# Patient Record
Sex: Female | Born: 1945
Health system: Southern US, Community
[De-identification: ages and names within clinical notes are randomized; demographics above are authoritative.]

## PROBLEM LIST (undated history)

## (undated) DIAGNOSIS — F17200 Nicotine dependence, unspecified, uncomplicated: Secondary | ICD-10-CM

## (undated) DIAGNOSIS — Q632 Ectopic kidney: Secondary | ICD-10-CM

## (undated) DIAGNOSIS — Z9989 Dependence on other enabling machines and devices: Secondary | ICD-10-CM

## (undated) DIAGNOSIS — H9319 Tinnitus, unspecified ear: Secondary | ICD-10-CM

## (undated) DIAGNOSIS — E079 Disorder of thyroid, unspecified: Secondary | ICD-10-CM

## (undated) DIAGNOSIS — R51 Headache: Secondary | ICD-10-CM

## (undated) DIAGNOSIS — Z9289 Personal history of other medical treatment: Secondary | ICD-10-CM

## (undated) DIAGNOSIS — T7840XA Allergy, unspecified, initial encounter: Secondary | ICD-10-CM

## (undated) DIAGNOSIS — I499 Cardiac arrhythmia, unspecified: Secondary | ICD-10-CM

## (undated) DIAGNOSIS — M25569 Pain in unspecified knee: Secondary | ICD-10-CM

## (undated) DIAGNOSIS — R7303 Prediabetes: Secondary | ICD-10-CM

## (undated) DIAGNOSIS — G4730 Sleep apnea, unspecified: Secondary | ICD-10-CM

## (undated) DIAGNOSIS — E039 Hypothyroidism, unspecified: Secondary | ICD-10-CM

## (undated) DIAGNOSIS — K56609 Unspecified intestinal obstruction, unspecified as to partial versus complete obstruction: Secondary | ICD-10-CM

## (undated) DIAGNOSIS — E119 Type 2 diabetes mellitus without complications: Secondary | ICD-10-CM

## (undated) DIAGNOSIS — M109 Gout, unspecified: Secondary | ICD-10-CM

## (undated) DIAGNOSIS — I509 Heart failure, unspecified: Secondary | ICD-10-CM

## (undated) DIAGNOSIS — D0510 Intraductal carcinoma in situ of unspecified breast: Secondary | ICD-10-CM

## (undated) DIAGNOSIS — M1990 Unspecified osteoarthritis, unspecified site: Secondary | ICD-10-CM

## (undated) DIAGNOSIS — I10 Essential (primary) hypertension: Secondary | ICD-10-CM

## (undated) DIAGNOSIS — E785 Hyperlipidemia, unspecified: Secondary | ICD-10-CM

## (undated) DIAGNOSIS — M25519 Pain in unspecified shoulder: Secondary | ICD-10-CM

## (undated) HISTORY — DX: Ectopic kidney: Q63.2

## (undated) HISTORY — DX: Heart failure, unspecified: I50.9

## (undated) HISTORY — DX: Allergy, unspecified, initial encounter: T78.40XA

## (undated) HISTORY — PX: AUGMENTATION MAMMAPLASTY: SUR837

## (undated) HISTORY — DX: Dependence on other enabling machines and devices: Z99.89

## (undated) HISTORY — DX: Essential (primary) hypertension: I10

## (undated) HISTORY — PX: CHOLECYSTECTOMY: SHX55

## (undated) HISTORY — PX: OTHER SURGICAL HISTORY: SHX169

## (undated) HISTORY — DX: Prediabetes: R73.03

## (undated) HISTORY — PX: ECTOPIC PREGNANCY SURGERY: SHX613

## (undated) HISTORY — PX: APPENDECTOMY: SHX54

## (undated) HISTORY — PX: UPPER GASTROINTESTINAL ENDOSCOPY: SHX188

## (undated) HISTORY — PX: JOINT REPLACEMENT: SHX530

## (undated) HISTORY — DX: Hyperlipidemia, unspecified: E78.5

## (undated) HISTORY — DX: Nicotine dependence, unspecified, uncomplicated: F17.200

## (undated) HISTORY — PX: POLYPECTOMY: SHX149

## (undated) HISTORY — DX: Personal history of other medical treatment: Z92.89

## (undated) HISTORY — DX: Disorder of thyroid, unspecified: E07.9

## (undated) HISTORY — PX: BACK SURGERY: SHX140

---
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
## 1984-06-24 DIAGNOSIS — Z9289 Personal history of other medical treatment: Secondary | ICD-10-CM

## 1984-06-24 HISTORY — PX: ABDOMINAL HYSTERECTOMY: SHX81

## 1984-06-24 HISTORY — DX: Personal history of other medical treatment: Z92.89

## 1997-12-19 ENCOUNTER — Ambulatory Visit (HOSPITAL_COMMUNITY): Admission: RE | Admit: 1997-12-19 | Discharge: 1997-12-19 | Payer: Self-pay | Admitting: Family Medicine

## 1998-12-28 ENCOUNTER — Encounter: Payer: Self-pay | Admitting: Family Medicine

## 1998-12-28 ENCOUNTER — Ambulatory Visit (HOSPITAL_COMMUNITY): Admission: RE | Admit: 1998-12-28 | Discharge: 1998-12-28 | Payer: Self-pay | Admitting: Family Medicine

## 1999-12-31 ENCOUNTER — Encounter: Payer: Self-pay | Admitting: Family Medicine

## 1999-12-31 ENCOUNTER — Ambulatory Visit (HOSPITAL_COMMUNITY): Admission: RE | Admit: 1999-12-31 | Discharge: 1999-12-31 | Payer: Self-pay | Admitting: Family Medicine

## 2001-01-02 ENCOUNTER — Encounter: Payer: Self-pay | Admitting: Family Medicine

## 2001-01-02 ENCOUNTER — Ambulatory Visit (HOSPITAL_COMMUNITY): Admission: RE | Admit: 2001-01-02 | Discharge: 2001-01-02 | Payer: Self-pay | Admitting: Family Medicine

## 2001-01-26 ENCOUNTER — Ambulatory Visit (HOSPITAL_COMMUNITY): Admission: RE | Admit: 2001-01-26 | Discharge: 2001-01-26 | Payer: Self-pay | Admitting: Family Medicine

## 2001-01-26 ENCOUNTER — Encounter: Payer: Self-pay | Admitting: Family Medicine

## 2002-01-27 ENCOUNTER — Ambulatory Visit (HOSPITAL_COMMUNITY): Admission: RE | Admit: 2002-01-27 | Discharge: 2002-01-27 | Payer: Self-pay | Admitting: Family Medicine

## 2002-01-27 ENCOUNTER — Encounter: Payer: Self-pay | Admitting: Family Medicine

## 2003-02-03 ENCOUNTER — Ambulatory Visit (HOSPITAL_COMMUNITY): Admission: RE | Admit: 2003-02-03 | Discharge: 2003-02-03 | Payer: Self-pay | Admitting: Family Medicine

## 2003-02-03 ENCOUNTER — Encounter: Payer: Self-pay | Admitting: Family Medicine

## 2003-02-11 ENCOUNTER — Ambulatory Visit (HOSPITAL_BASED_OUTPATIENT_CLINIC_OR_DEPARTMENT_OTHER): Admission: RE | Admit: 2003-02-11 | Discharge: 2003-02-11 | Payer: Self-pay | Admitting: Family Medicine

## 2003-07-07 ENCOUNTER — Encounter: Admission: RE | Admit: 2003-07-07 | Discharge: 2003-07-07 | Payer: Self-pay | Admitting: Orthopedic Surgery

## 2003-07-22 ENCOUNTER — Encounter: Admission: RE | Admit: 2003-07-22 | Discharge: 2003-07-22 | Payer: Self-pay | Admitting: Orthopedic Surgery

## 2003-07-26 ENCOUNTER — Encounter: Admission: RE | Admit: 2003-07-26 | Discharge: 2003-07-26 | Payer: Self-pay | Admitting: Family Medicine

## 2004-02-07 ENCOUNTER — Ambulatory Visit (HOSPITAL_COMMUNITY): Admission: RE | Admit: 2004-02-07 | Discharge: 2004-02-07 | Payer: Self-pay | Admitting: Family Medicine

## 2004-07-11 ENCOUNTER — Encounter: Admission: RE | Admit: 2004-07-11 | Discharge: 2004-07-11 | Payer: Self-pay | Admitting: Family Medicine

## 2004-11-14 ENCOUNTER — Emergency Department (HOSPITAL_COMMUNITY): Admission: EM | Admit: 2004-11-14 | Discharge: 2004-11-14 | Payer: Self-pay | Admitting: Emergency Medicine

## 2005-02-07 ENCOUNTER — Ambulatory Visit (HOSPITAL_COMMUNITY): Admission: RE | Admit: 2005-02-07 | Discharge: 2005-02-07 | Payer: Self-pay | Admitting: Family Medicine

## 2006-02-12 ENCOUNTER — Ambulatory Visit (HOSPITAL_COMMUNITY): Admission: RE | Admit: 2006-02-12 | Discharge: 2006-02-12 | Payer: Self-pay | Admitting: Family Medicine

## 2007-02-19 ENCOUNTER — Ambulatory Visit (HOSPITAL_COMMUNITY): Admission: RE | Admit: 2007-02-19 | Discharge: 2007-02-19 | Payer: Self-pay | Admitting: Family Medicine

## 2008-02-03 ENCOUNTER — Ambulatory Visit: Payer: Self-pay | Admitting: Gastroenterology

## 2008-02-15 ENCOUNTER — Ambulatory Visit: Payer: Self-pay | Admitting: Gastroenterology

## 2008-02-15 HISTORY — PX: COLONOSCOPY: SHX174

## 2008-02-15 LAB — HM COLONOSCOPY: HM Colonoscopy: NORMAL

## 2008-02-24 ENCOUNTER — Ambulatory Visit (HOSPITAL_COMMUNITY): Admission: RE | Admit: 2008-02-24 | Discharge: 2008-02-24 | Payer: Self-pay | Admitting: Family Medicine

## 2008-04-11 ENCOUNTER — Emergency Department (HOSPITAL_COMMUNITY): Admission: EM | Admit: 2008-04-11 | Discharge: 2008-04-11 | Payer: Self-pay | Admitting: Emergency Medicine

## 2009-03-16 ENCOUNTER — Ambulatory Visit (HOSPITAL_COMMUNITY): Admission: RE | Admit: 2009-03-16 | Discharge: 2009-03-16 | Payer: Self-pay | Admitting: Family Medicine

## 2009-12-28 ENCOUNTER — Encounter: Admission: RE | Admit: 2009-12-28 | Discharge: 2009-12-28 | Payer: Self-pay | Admitting: Family Medicine

## 2010-03-22 ENCOUNTER — Ambulatory Visit (HOSPITAL_COMMUNITY): Admission: RE | Admit: 2010-03-22 | Discharge: 2010-03-22 | Payer: Self-pay | Admitting: Family Medicine

## 2010-07-14 ENCOUNTER — Encounter: Payer: Self-pay | Admitting: Orthopedic Surgery

## 2010-07-14 ENCOUNTER — Encounter: Payer: Self-pay | Admitting: Interventional Radiology

## 2010-07-16 ENCOUNTER — Encounter: Payer: Self-pay | Admitting: Family Medicine

## 2010-10-04 ENCOUNTER — Encounter: Payer: Self-pay | Admitting: *Deleted

## 2010-10-04 ENCOUNTER — Encounter: Payer: Self-pay | Admitting: Cardiovascular Disease

## 2010-10-04 ENCOUNTER — Ambulatory Visit (INDEPENDENT_AMBULATORY_CARE_PROVIDER_SITE_OTHER): Payer: 59 | Admitting: Cardiovascular Disease

## 2010-10-04 VITALS — BP 140/90 | HR 56 | Wt 191.0 lb

## 2010-10-04 DIAGNOSIS — I5022 Chronic systolic (congestive) heart failure: Secondary | ICD-10-CM | POA: Insufficient documentation

## 2010-10-04 DIAGNOSIS — E039 Hypothyroidism, unspecified: Secondary | ICD-10-CM

## 2010-10-04 DIAGNOSIS — R079 Chest pain, unspecified: Secondary | ICD-10-CM

## 2010-10-04 DIAGNOSIS — E785 Hyperlipidemia, unspecified: Secondary | ICD-10-CM

## 2010-10-04 DIAGNOSIS — I10 Essential (primary) hypertension: Secondary | ICD-10-CM

## 2010-10-04 DIAGNOSIS — I509 Heart failure, unspecified: Secondary | ICD-10-CM

## 2010-10-04 LAB — HEPATIC FUNCTION PANEL
AST: 27 U/L (ref 0–37)
Albumin: 3.6 g/dL (ref 3.5–5.2)
Alkaline Phosphatase: 75 U/L (ref 39–117)
Bilirubin, Direct: 0.1 mg/dL (ref 0.0–0.3)

## 2010-10-04 LAB — BASIC METABOLIC PANEL
CO2: 27 mEq/L (ref 19–32)
GFR: 110.45 mL/min (ref >60.00)
Glucose, Bld: 91 mg/dL (ref 70–99)
Potassium: 3.9 mEq/L (ref 3.5–5.1)
Sodium: 140 mEq/L (ref 135–145)

## 2010-10-04 MED ORDER — LISINOPRIL 20 MG PO TABS: 20.0000 mg | ORAL_TABLET | Freq: Every day | ORAL | Status: DC

## 2010-10-04 MED ORDER — AMLODIPINE BESYLATE 5 MG PO TABS: 5.0000 mg | ORAL_TABLET | Freq: Every day | ORAL | Status: DC

## 2010-10-04 NOTE — Assessment & Plan Note (Addendum)
Adrienne [name] is currently working for another medical Dr. She is to see Dr. Noel Journey but she is no longer seeing patients in [place]. We'll check a TSH today. We'll check a basic metabolic profile.  October 05, 2010.  The TSH level returned and was out of be quite low. We will decrease her Synthroid to 0.075 mg a day. She'll need to find a general medical Dr. For further follow. We'll recheck her TSH in and 6 weeks.

## 2010-10-04 NOTE — Progress Notes (Signed)
Adrienne [name] Date of Birth  04-10-1946 [facility] [address]     [address] [place], [place]  [address] [phone]  Fax  [phone]   History of Present Illness:  Adrienne [name] is a middle-age female with a history of hypertension, dyslipidemia, palpitations. She also has a history of chest pain. A nuclear stress test from March of 2010 was negative for ischemia.   She has not had episodes of chest pain or shortness of breath.  Current Outpatient Prescriptions  Medication Sig Dispense Refill  . amLODipine (NORVASC) 5 MG tablet Take 5 mg by mouth daily.        Marland Kitchen aspirin 81 MG tablet Take 81 mg by mouth as needed.        . carvedilol (COREG) 3.125 MG tablet Take 3.125 mg by mouth daily.        Marland Kitchen levothyroxine (SYNTHROID, LEVOTHROID) 100 MCG tablet Take 100 mcg by mouth daily.        Marland Kitchen lisinopril (PRINIVIL,ZESTRIL) 10 MG tablet Take 10 mg by mouth daily.          No Known Allergies  Past Medical History  Diagnosis Date  . Hypertension   . Thyroid disease   . Hyperlipidemia   . CHF (congestive heart failure)     EF 45-50%    Past Surgical History  Procedure Date  . Vaginal hysterectomy     1986  . Back surgery   . Cholecystectomy     History  Smoking status  . Former Smoker  . Quit date: 05/24/2010  Smokeless tobacco  . Not on file    History  Alcohol Use No    No family history on file.  Reviw of Systems:  Reviewed in the history of present illness. All other systems are negative.  Physical Exam: BP 140/90  Pulse 56  Wt 191 lb (86.637 kg) The patient is alert and oriented x 3.  The mood and affect are normal.  The skin is warm and dry.  Color is normal.  The HEENT exam reveals that the sclera are nonicteric.  The mucous membranes are moist.  The carotids are 2+ without bruits.  There is no thyromegaly.  There is no JVD.  The lungs are clear.  The chest wall is non tender.  The heart exam reveals a regular rate  with a normal S1 and S2.  There are no murmurs, gallops, or rubs.  The PMI is not displaced.   Abdominal exam reveals good bowel sounds.  There is no guarding or rebound.  There is no hepatosplenomegaly or tenderness.  There are no masses.  Exam of the legs reveal no clubbing, cyanosis, or edema.  The legs are without rashes.  The distal pulses are intact.  Cranial nerves II - XII are intact.  Motor and sensory functions are intact.  The gait is normal.  ECG: Sinus bradycardia with frequent premature ventricular contractions Assessment / Plan:

## 2010-10-04 NOTE — Assessment & Plan Note (Signed)
Her blood pressure remains mildly elevated. We'll increase her lisinopril to 20 mg a day. I've asked her to watch her salt intake. We will see her again in several months.

## 2010-10-05 ENCOUNTER — Other Ambulatory Visit: Payer: Self-pay | Admitting: *Deleted

## 2010-10-05 ENCOUNTER — Encounter: Payer: Self-pay | Admitting: Cardiovascular Disease

## 2010-10-05 DIAGNOSIS — E039 Hypothyroidism, unspecified: Secondary | ICD-10-CM

## 2010-10-05 MED ORDER — LEVOTHYROXINE SODIUM 75 MCG PO TABS: 75.0000 ug | ORAL_TABLET | Freq: Every day | ORAL | Status: DC

## 2010-10-05 NOTE — Telephone Encounter (Signed)
Patient called with lab results. synthroid adjusted. Faxed new order to pharmacy. Pt verbalized understanding. Alfonso Ramus RN

## 2010-11-08 ENCOUNTER — Other Ambulatory Visit: Payer: Self-pay | Admitting: *Deleted

## 2010-11-08 DIAGNOSIS — I10 Essential (primary) hypertension: Secondary | ICD-10-CM

## 2010-11-08 MED ORDER — AMLODIPINE BESYLATE 5 MG PO TABS: 5.0000 mg | ORAL_TABLET | Freq: Every day | ORAL | Status: DC

## 2010-11-08 NOTE — Telephone Encounter (Signed)
Jodette Tamico Mundo RN  

## 2010-11-16 ENCOUNTER — Other Ambulatory Visit (INDEPENDENT_AMBULATORY_CARE_PROVIDER_SITE_OTHER): Payer: 59 | Admitting: *Deleted

## 2010-11-16 DIAGNOSIS — E039 Hypothyroidism, unspecified: Secondary | ICD-10-CM

## 2010-11-21 ENCOUNTER — Telehealth: Payer: Self-pay | Admitting: *Deleted

## 2010-11-21 NOTE — Telephone Encounter (Signed)
Message copied by Antony Odea on Wed Nov 21, 2010 10:45 AM ------      Message from: Vesta Mixer      Created: Tue Nov 20, 2010 11:38 AM       tsh is better.

## 2010-11-21 NOTE — Telephone Encounter (Signed)
Patient called with lab results. Pt verbalized understanding. Pt feeling better now that she is back on med.Alfonso Ramus RN

## 2010-12-07 ENCOUNTER — Other Ambulatory Visit (HOSPITAL_COMMUNITY): Payer: Self-pay | Admitting: *Deleted

## 2010-12-07 ENCOUNTER — Other Ambulatory Visit: Payer: Self-pay | Admitting: Family Medicine

## 2010-12-07 DIAGNOSIS — Z1231 Encounter for screening mammogram for malignant neoplasm of breast: Secondary | ICD-10-CM

## 2010-12-20 ENCOUNTER — Ambulatory Visit (HOSPITAL_COMMUNITY)
Admission: RE | Admit: 2010-12-20 | Discharge: 2010-12-20 | Disposition: A | Discharge status: Home / Self Care | Payer: 59 | Source: Ambulatory Visit | Attending: Family Medicine | Admitting: Family Medicine

## 2010-12-20 DIAGNOSIS — Z1231 Encounter for screening mammogram for malignant neoplasm of breast: Secondary | ICD-10-CM | POA: Insufficient documentation

## 2010-12-21 ENCOUNTER — Other Ambulatory Visit: Payer: Self-pay | Admitting: *Deleted

## 2010-12-21 DIAGNOSIS — I10 Essential (primary) hypertension: Secondary | ICD-10-CM

## 2010-12-21 DIAGNOSIS — I509 Heart failure, unspecified: Secondary | ICD-10-CM

## 2010-12-21 MED ORDER — LISINOPRIL 20 MG PO TABS: 20.0000 mg | ORAL_TABLET | Freq: Every day | ORAL | Status: DC

## 2010-12-21 NOTE — Telephone Encounter (Signed)
escribe medication per fax request  

## 2011-03-26 LAB — CBC
Hemoglobin: 14.4
MCHC: 32.4
MCV: 92.6
RDW: 13.7

## 2011-03-26 LAB — POCT I-STAT, CHEM 8
Creatinine, Ser: 0.9
HCT: 44
Hemoglobin: 15
Sodium: 140
TCO2: 23

## 2011-03-26 LAB — DIFFERENTIAL
Basophils Absolute: 0.1
Basophils Relative: 1
Eosinophils Absolute: 0.2
Eosinophils Relative: 2

## 2011-11-26 ENCOUNTER — Other Ambulatory Visit: Payer: Self-pay | Admitting: Cardiovascular Disease

## 2011-11-26 DIAGNOSIS — I10 Essential (primary) hypertension: Secondary | ICD-10-CM

## 2011-11-26 NOTE — Telephone Encounter (Signed)
Amlodipine 5mg

## 2011-11-26 NOTE — Telephone Encounter (Signed)
Opened in Error.

## 2012-02-25 ENCOUNTER — Other Ambulatory Visit: Payer: Self-pay | Admitting: *Deleted

## 2012-02-25 MED ORDER — LISINOPRIL 20 MG PO TABS: 20.0000 mg | ORAL_TABLET | Freq: Every day | ORAL | Status: DC

## 2012-02-25 MED ORDER — CARVEDILOL 3.125 MG PO TABS: 3.1250 mg | ORAL_TABLET | Freq: Every day | ORAL | Status: DC

## 2012-02-25 MED ORDER — LEVOTHYROXINE SODIUM 75 MCG PO TABS: 75.0000 ug | ORAL_TABLET | Freq: Every day | ORAL | Status: DC

## 2012-02-25 MED ORDER — AMLODIPINE BESYLATE 5 MG PO TABS: 5.0000 mg | ORAL_TABLET | Freq: Every day | ORAL | Status: DC

## 2012-02-25 NOTE — Telephone Encounter (Signed)
Pt needs appointment then refill can be made Fax Received. Refill Completed. Kutler Vanvranken Chowoe (R.M.A)   

## 2012-02-27 ENCOUNTER — Encounter: Payer: Self-pay | Admitting: *Deleted

## 2012-09-23 ENCOUNTER — Other Ambulatory Visit: Payer: Self-pay | Admitting: Family Medicine

## 2012-09-23 DIAGNOSIS — Z1231 Encounter for screening mammogram for malignant neoplasm of breast: Secondary | ICD-10-CM

## 2012-09-24 ENCOUNTER — Ambulatory Visit (HOSPITAL_COMMUNITY)
Admission: RE | Admit: 2012-09-24 | Discharge: 2012-09-24 | Disposition: A | Discharge status: Home / Self Care | Payer: Medicare Other | Source: Ambulatory Visit | Attending: Family Medicine | Admitting: Family Medicine

## 2012-09-24 DIAGNOSIS — Z1231 Encounter for screening mammogram for malignant neoplasm of breast: Secondary | ICD-10-CM | POA: Insufficient documentation

## 2012-10-02 ENCOUNTER — Other Ambulatory Visit: Payer: Medicare Other

## 2012-11-17 ENCOUNTER — Encounter: Payer: Self-pay | Admitting: *Deleted

## 2012-11-18 ENCOUNTER — Ambulatory Visit (INDEPENDENT_AMBULATORY_CARE_PROVIDER_SITE_OTHER): Payer: Medicare Other | Admitting: Obstetrics and Gynecology

## 2012-11-18 ENCOUNTER — Encounter: Payer: Self-pay | Admitting: Obstetrics and Gynecology

## 2012-11-18 VITALS — BP 120/70 | Ht 64.0 in | Wt 177.0 lb

## 2012-11-18 DIAGNOSIS — Z1212 Encounter for screening for malignant neoplasm of rectum: Secondary | ICD-10-CM

## 2012-11-18 DIAGNOSIS — Z01419 Encounter for gynecological examination (general) (routine) without abnormal findings: Secondary | ICD-10-CM

## 2012-11-18 LAB — HEMOCCULT GUIAC POC 1CARD (OFFICE): Fecal Occult Blood, POC: NEGATIVE

## 2012-11-18 NOTE — Patient Instructions (Signed)
Wellness exam frequencies reviewed

## 2012-11-18 NOTE — Addendum Note (Signed)
Addended by: Richardson Chiquito on: 11/18/2012 10:58 AM   Modules accepted: Orders

## 2012-11-18 NOTE — Progress Notes (Signed)
Patient ID: Adrienne [name], female   DOB: 1945-12-14, 67 y.o.   MRN: [id number] Pt here today for yearly Pap and Physical. Pt has had a hysterectomy. Pt denies any problems at this time.  Assessment:  Normal Gyn Exam s/p hyst   Plan:  1. Mammogram yearly 2. pap smear done, 3. return annually  Subjective:  Adrienne [name] is a 67 y.o. female [phone] who presents for annual exam.  The patient has complaints today of none  The following portions of the patient's history were reviewed and updated as appropriate: allergies, current medications, past family history, past medical history, past social history, past surgical history and problem list.  Review of Systems Pertinent items are noted in HPI.  Objective:  BP 120/70  Ht 5\' 4"  (1.626 m)  Wt 177 lb (80.287 kg)  BMI 30.37 kg/m2  BMI: Body mass index is 30.37 kg/(m^2). General Appearance: Alert, appropriate appearance for age. No acute distress HEENT: Grossly normal Neck / Thyroid:  Cardiovascular: RRR; normal S1, S2, no murmur Lungs: CTA bilaterally Back: No CVAT Breast Exam: No dimpling, nipple retraction or discharge. No masses or nodes., Normal to inspection, Normal breast tissue bilaterally and No masses or nodes.No dimpling, nipple retraction or discharge. Gastrointestinal: Soft, non-tender, no masses or organomegaly Pelvic Exam: External genitalia: normal general appearance Vaginal: normal mucosa without prolapse or lesions Cervix: absent and removed surgically Adnexa: normal bimanual exam Rectal: good sphincter tone and guaiac negative Rectovaginal: normal rectal, no masses Lymphatic Exam: Non-palpable nodes in neck, clavicular, axillary, or inguinal regions Skin: no rash or abnormalities Neurologic: Normal gait and speech, no tremor  Psychiatric: Alert and oriented, appropriate affect.  Urinalysis:Not done  Christin Bach. MD Pgr [phone] 10:51 AM

## 2012-11-25 ENCOUNTER — Ambulatory Visit (INDEPENDENT_AMBULATORY_CARE_PROVIDER_SITE_OTHER): Payer: Medicare Other | Admitting: Physician Assistant

## 2012-11-25 ENCOUNTER — Encounter: Payer: Self-pay | Admitting: Physician Assistant

## 2012-11-25 VITALS — BP 144/86 | HR 56 | Temp 98.6°F | Resp 16 | Ht 63.25 in | Wt 178.0 lb

## 2012-11-25 DIAGNOSIS — I5022 Chronic systolic (congestive) heart failure: Secondary | ICD-10-CM

## 2012-11-25 DIAGNOSIS — E785 Hyperlipidemia, unspecified: Secondary | ICD-10-CM

## 2012-11-25 DIAGNOSIS — E039 Hypothyroidism, unspecified: Secondary | ICD-10-CM

## 2012-11-25 DIAGNOSIS — I509 Heart failure, unspecified: Secondary | ICD-10-CM

## 2012-11-25 DIAGNOSIS — I10 Essential (primary) hypertension: Secondary | ICD-10-CM

## 2012-11-25 DIAGNOSIS — F17200 Nicotine dependence, unspecified, uncomplicated: Secondary | ICD-10-CM | POA: Insufficient documentation

## 2012-11-25 LAB — COMPLETE METABOLIC PANEL WITH GFR
ALT: 14 U/L (ref 0–35)
CO2: 26 mEq/L (ref 19–32)
Calcium: 9.6 mg/dL (ref 8.4–10.5)
Chloride: 106 mEq/L (ref 96–112)
Creat: 0.93 mg/dL (ref 0.50–1.10)
GFR, Est African American: 75 mL/min

## 2012-11-25 LAB — LIPID PANEL
LDL Cholesterol: 208 mg/dL — ABNORMAL HIGH (ref 0–99)
Triglycerides: 145 mg/dL (ref <150)

## 2012-11-25 LAB — TSH: TSH: 1.579 u[IU]/mL (ref 0.350–4.500)

## 2012-11-25 MED ORDER — AMLODIPINE BESYLATE 5 MG PO TABS: 5.0000 mg | ORAL_TABLET | Freq: Every day | ORAL | Status: DC

## 2012-11-25 MED ORDER — LISINOPRIL 20 MG PO TABS: 20.0000 mg | ORAL_TABLET | Freq: Every day | ORAL | Status: DC

## 2012-11-25 MED ORDER — CARVEDILOL 3.125 MG PO TABS: 3.1250 mg | ORAL_TABLET | Freq: Two times a day (BID) | ORAL | Status: DC

## 2012-11-25 MED ORDER — LEVOTHYROXINE SODIUM 75 MCG PO TABS: 75.0000 ug | ORAL_TABLET | Freq: Every day | ORAL | Status: DC

## 2012-11-25 NOTE — Progress Notes (Signed)
Patient ID: Adrienne [name] MRN: [id number], DOB: 1945/08/15, 67 y.o. Date of Encounter: @DATE @  Chief Complaint:  Chief Complaint  Patient presents with  . Hypertension    HPI: 67 y.o. year old AA  female  presents for routine f/u of:  1-HTN: She IS taking all BP meds as directed. Has no adv effects.  2-Hypothyroid: IS taking thyroid med as directed. No significant change in energy level or weight.  3- Smoker: Says she knows she can quit on her own if she decide to b/c she sometimes goes weeks without smoking. However, she says "this isnt a good time for that--too much stress at home"   She has no complaints today. Says she has been feeling fine. Has gone to no other doctors since LOV here except her Gyn.  Reports no chest pressure, heaviness, tightness and no SOB/DOE.   Past Medical History  Diagnosis Date  . Hypertension   . Thyroid disease   . Hyperlipidemia   . CHF (congestive heart failure)     EF 45-50%  . Smoker      Home Meds: See attached medication section for current medication list. Any medications entered into computer today will not appear on this note's list. The medications listed below were entered prior to today. Current Outpatient Prescriptions on File Prior to Visit  Medication Sig Dispense Refill  . aspirin 81 MG tablet Take 81 mg by mouth as needed.         No current facility-administered medications on file prior to visit.    Allergies: No Known Allergies  History   Social History  . Marital Status: Widowed    Spouse Name: N/A    Number of Children: N/A  . Years of Education: N/A   Occupational History  . Not on file.   Social History Main Topics  . Smoking status: Current Every Day Smoker -- 1.00 packs/day for 1 years    Types: Cigarettes    Last Attempt to Quit: 05/24/2010  . Smokeless tobacco: Never Used     Comment: Started back smoking last year.  . Alcohol Use: No  . Drug Use: No  . Sexually Active: Not Currently    Birth  Control/ Protection: Surgical   Other Topics Concern  . Not on file   Social History Narrative  . No narrative on file    Family History  Problem Relation Age of Onset  . Cancer Sister   . Diabetes Brother   . Hypertension Other   . Diabetes Brother      Review of Systems:  See HPI for pertinent ROS. All other ROS negative.    Physical Exam: Blood pressure 144/86, pulse 56, temperature 98.6 F (37 C), temperature source Oral, resp. rate 16, height 5' 3.25" (1.607 m), weight 178 lb (80.74 kg)., Body mass index is 31.26 kg/(m^2). General:Mild Obese AAF. Appears in no acute distress. Neck: Supple. No thyromegaly. No lymphadenopathy. No carotid bruit.  Lungs: Clear bilaterally to auscultation without wheezes, rales, or rhonchi. Breathing is unlabored. Heart: RRR with S1 S2. No murmurs, rubs, or gallops. Abdomen: Soft, non-tender, non-distended with normoactive bowel sounds. No hepatomegaly. No rebound/guarding. No obvious abdominal masses. Musculoskeletal:  Strength and tone normal for age. Extremities/Skin: Warm and dry. No clubbing or cyanosis. No edema. No rashes or suspicious lesions. Neuro: Alert and oriented X 3. Moves all extremities spontaneously. Gait is normal. CNII-XII grossly in tact. Psych:  Responds to questions appropriately with a normal affect.  ASSESSMENT AND PLAN:  67 y.o. year old female with  1. Hypertension Pt says she had argument with grandson this morning right before appt. Says she know that is why BP is up. Also says she forgot to take med last night. She says she checks BP at home and never higher than 120s.  Will cont current meds. Check BMET to monitor.  - COMPLETE METABOLIC PANEL WITH GFR - amLODipine (NORVASC) 5 MG tablet; Take 1 tablet (5 mg total) by mouth daily.  Dispense: 90 tablet; Refill: 1 - carvedilol (COREG) 3.125 MG tablet; Take 1 tablet (3.125 mg total) by mouth 2 (two) times daily with a meal.  Dispense: 180 tablet; Refill: 1 -  lisinopril (PRINIVIL,ZESTRIL) 20 MG tablet; Take 1 tablet (20 mg total) by mouth daily.  Dispense: 90 tablet; Refill: 1  2. Hypothyroidism Currnetly on 75 mcg. Recheck TSH then adjust accordingly. Pt aware not to pick up refill until we get lab results back.  - TSH - levothyroxine (SYNTHROID, LEVOTHROID) 75 MCG tablet; Take 1 tablet (75 mcg total) by mouth daily.  Dispense: 90 tablet; Refill: 1  3. Dyslipidemia In past she had EXTREMELY high cholesterol--04/25/12 LDL was 201. I asked her today why she repeatedly has been noncompliant with this (Had been VERY high in past and she did not f/u with med then either). She says "I dont want to take another pill..If I start a pill, will I have to take it the rest of my life?"  I answered her question, discussed risk of MI, CVA, Death vs taking a pill. She is agreeable to take med if necessary. Will recheck lab then address accordingly. - COMPLETE METABOLIC PANEL WITH GFR - Lipid panel  4. Congestive heart failure, chronic, systolic We did not have any cardiac hx in her paper chart but this DX was in Epic. I then read notes in Epic and discussed with pt.  She saw Dr. Elease Hashimoto in past sec to palpitations. Also had chest pain and had Negative Nuclear Stress Test 08/2008.  She knows nothing about Depressed EF and I donot see any actual test reports in Epic but do see this in Dr Namon Cirri OV note.  She has no CHF symptoms or findings on exam. Is on Coreg and ACE Inh.  I told her to sched f/u with Dr. Elease Hashimoto.  LOV note I see in Epic was 10/04/2010.  - carvedilol (COREG) 3.125 MG tablet; Take 1 tablet (3.125 mg total) by mouth 2 (two) times daily with a meal.  Dispense: 180 tablet; Refill: 1 - lisinopril (PRINIVIL,ZESTRIL) 20 MG tablet; Take 1 tablet (20 mg total) by mouth daily.  Dispense: 90 tablet; Refill: 1  5. Smoker See HPI regarding Cessation.   Sees Gyn routinely for Gyn care.  Will discuss other preventive care at next OV. Needs ROV 6  months.   Signed, [address] [place], [place], [facility] 11/25/2012 11:18 AM

## 2012-11-26 ENCOUNTER — Telehealth: Payer: Self-pay | Admitting: Family Medicine

## 2012-11-26 DIAGNOSIS — E785 Hyperlipidemia, unspecified: Secondary | ICD-10-CM

## 2012-11-26 MED ORDER — ATORVASTATIN CALCIUM 80 MG PO TABS: 80.0000 mg | ORAL_TABLET | Freq: Every day | ORAL | Status: DC

## 2012-11-26 NOTE — Telephone Encounter (Signed)
Message copied by Donne Anon on Thu Nov 26, 2012  4:57 PM ------      Message from: Allayne Butcher      Created: Thu Nov 26, 2012  7:42 AM       Kidney and Liver labs normal. Continue current BP meds same.       TSH nml-Cont current dose of thyroid med the same.       Cholesterol is terrible, just as it has been in past. We discussed this at the OV-LDL always around 200. I ALWAYS tell her to take chol med and she NEVER does. Yesterday i asked her why and she says just b/c she doesn't want to get on another pill. I told her she can either take a pill or she can have heart attack, stroke, or die-that her chances of these happening are much higher given her cholesterol. She said she may take the pill if needed. Tell her that I recommend she take Lipitor 80 and recheck fasting lab in 6 weeks.      Order: Lipitor 80mg  one po QD # 30/ 0ne refill      Order: FLP/LFT. ------

## 2012-12-01 ENCOUNTER — Telehealth: Payer: Self-pay | Admitting: Family Medicine

## 2012-12-01 NOTE — Telephone Encounter (Signed)
Message copied by Donne Anon on Tue Dec 01, 2012 12:34 PM ------      Message from: Allayne Butcher      Created: Thu Nov 26, 2012  7:42 AM       Kidney and Liver labs normal. Continue current BP meds same.       TSH nml-Cont current dose of thyroid med the same.       Cholesterol is terrible, just as it has been in past. We discussed this at the OV-LDL always around 200. I ALWAYS tell her to take chol med and she NEVER does. Yesterday i asked her why and she says just b/c she doesn't want to get on another pill. I told her she can either take a pill or she can have heart attack, stroke, or die-that her chances of these happening are much higher given her cholesterol. She said she may take the pill if needed. Tell her that I recommend she take Lipitor 80 and recheck fasting lab in 6 weeks.      Order: Lipitor 80mg  one po QD # 30/ 0ne refill      Order: FLP/LFT. ------

## 2012-12-01 NOTE — Telephone Encounter (Signed)
Finally able to reach patient.  Told about cholesterol labs and need to take Lipitor.  She said "OK".  rx has already been sent to pharmacy and 6wk labs ordered

## 2013-02-10 ENCOUNTER — Telehealth: Payer: Self-pay | Admitting: Physician Assistant

## 2013-02-16 NOTE — Telephone Encounter (Signed)
Pt states she was not aware of having CHF.  States no one has every told her this that she recalls.  Looking at records it seems to have been entered by Cardiology.  Also mentioned to patient that provider recommended she follow up with them after last visit.  Told her to call Cardiologist and make follow up appt and discuss CHF finding with them.

## 2013-03-17 ENCOUNTER — Encounter: Payer: Self-pay | Admitting: Family Medicine

## 2013-03-17 ENCOUNTER — Other Ambulatory Visit: Payer: Self-pay | Admitting: Physician Assistant

## 2013-03-17 NOTE — Telephone Encounter (Signed)
Medication refill for one time only.  Patient needs to be seen.  Letter sent for patient to call and schedule.  Needs to have 6 week fasting labs

## 2013-05-16 ENCOUNTER — Other Ambulatory Visit: Payer: Self-pay | Admitting: Physician Assistant

## 2013-05-27 ENCOUNTER — Ambulatory Visit (INDEPENDENT_AMBULATORY_CARE_PROVIDER_SITE_OTHER): Payer: Medicare Other | Admitting: Physician Assistant

## 2013-05-27 ENCOUNTER — Encounter: Payer: Self-pay | Admitting: Physician Assistant

## 2013-05-27 VITALS — BP 116/70 | HR 56 | Temp 98.3°F | Resp 18 | Ht 62.5 in | Wt 182.0 lb

## 2013-05-27 DIAGNOSIS — I509 Heart failure, unspecified: Secondary | ICD-10-CM

## 2013-05-27 DIAGNOSIS — Z23 Encounter for immunization: Secondary | ICD-10-CM

## 2013-05-27 DIAGNOSIS — F17200 Nicotine dependence, unspecified, uncomplicated: Secondary | ICD-10-CM

## 2013-05-27 DIAGNOSIS — E785 Hyperlipidemia, unspecified: Secondary | ICD-10-CM

## 2013-05-27 DIAGNOSIS — E039 Hypothyroidism, unspecified: Secondary | ICD-10-CM

## 2013-05-27 DIAGNOSIS — I10 Essential (primary) hypertension: Secondary | ICD-10-CM

## 2013-05-27 LAB — COMPLETE METABOLIC PANEL WITH GFR
ALT: 33 U/L (ref 0–35)
AST: 26 U/L (ref 0–37)
Albumin: 4 g/dL (ref 3.5–5.2)
BUN: 12 mg/dL (ref 6–23)
Calcium: 9.5 mg/dL (ref 8.4–10.5)
Chloride: 102 mEq/L (ref 96–112)
Potassium: 4.2 mEq/L (ref 3.5–5.3)
Total Protein: 7.8 g/dL (ref 6.0–8.3)

## 2013-05-27 LAB — TSH: TSH: 3.856 u[IU]/mL (ref 0.350–4.500)

## 2013-05-27 LAB — LIPID PANEL
LDL Cholesterol: 127 mg/dL — ABNORMAL HIGH (ref 0–99)
VLDL: 30 mg/dL (ref 0–40)

## 2013-05-27 NOTE — Progress Notes (Signed)
Patient ID: NINI CAVAN MRN: [id number], DOB: 10/24/1945, 67 y.o. Date of Encounter: @DATE @  Chief Complaint:  Chief Complaint  Patient presents with  . 6 mth visit    is fasting    HPI: 67 y.o. year old AA female  presents for routine followup office visit.  Hypertension: She is taking blood pressure medications as directed. No adverse effects.  #2 hypothyroidism: She is taking thyroid medication as directed.  #3 smoker: At the last office visit in June 2014 she reported that she knew she could quit on her own if decided to. She said that she can go weeks without smoking. However at that time she said that it "was not a good time to quit there was too much stress at home."  ToDay she does report that the stress at home has improved. However she does still smoke about one half pack per day. She says she has decreased some. Says that when she is on vacation she smokes none.  4- hyperlipidemia: In the past she had extremely high cholesterol with LDL 201. However she was repeatedly noncompliant with medications. At her last visit with me June 2014 I asked her why she was repeatedly noncompliant with these medicines. She said "I don't want to take another pill. If I start a pill I will have to take it the rest of my  Life." at that time I discussed the risk of MI stroke death versus taking a pill. She was agreeable to take the medication. SHe had repeat lipid panel done at that visit in June 2014. LDL was 208. I prescribed Lipitor 80 mg. She says that when she took that it caused a significant headache. She cut the pill in half. She is taking Lipitor 40 mg daily with no adverse effects.   Past Medical History  Diagnosis Date  . Hypertension   . Thyroid disease   . Hyperlipidemia   . CHF (congestive heart failure)     EF 45-50%  . Smoker      Home Meds: See attached medication section for current medication list. Any medications entered into computer today will not appear on  this note's list. The medications listed below were entered prior to today. Current Outpatient Prescriptions on File Prior to Visit  Medication Sig Dispense Refill  . amLODipine (NORVASC) 5 MG tablet Take 1 tablet (5 mg total) by mouth daily.  90 tablet  1  . aspirin 81 MG tablet Take 81 mg by mouth as needed.        Marland Kitchen atorvastatin (LIPITOR) 80 MG tablet TAKE 1 TABLET (80 MG TOTAL) BY MOUTH AT BEDTIME.  30 tablet  0  . carvedilol (COREG) 3.125 MG tablet Take 1 tablet (3.125 mg total) by mouth 2 (two) times daily with a meal.  180 tablet  1  . levothyroxine (SYNTHROID, LEVOTHROID) 75 MCG tablet TAKE 1 TABLET (75 MCG TOTAL) BY MOUTH DAILY.  90 tablet  0  . lisinopril (PRINIVIL,ZESTRIL) 20 MG tablet Take 1 tablet (20 mg total) by mouth daily.  90 tablet  1  . Multiple Vitamins-Minerals (MULTIVITAMIN PO) Take 1 tablet by mouth daily. MVI w/Calcium      . Omega-3 Fatty Acids (FISH OIL PO) Take 1 tablet by mouth daily.       No current facility-administered medications on file prior to visit.    Allergies: No Known Allergies  History   Social History  . Marital Status: Widowed    Spouse Name: N/A  Number of Children: N/A  . Years of Education: N/A   Occupational History  . Not on file.   Social History Main Topics  . Smoking status: Current Every Day Smoker -- 1.00 packs/day for 1 years    Types: Cigarettes    Last Attempt to Quit: 05/24/2010  . Smokeless tobacco: Never Used     Comment: Started back smoking last year.  . Alcohol Use: No  . Drug Use: No  . Sexual Activity: Not Currently    Birth Control/ Protection: Surgical   Other Topics Concern  . Not on file   Social History Narrative  . No narrative on file    Family History  Problem Relation Age of Onset  . Cancer Sister   . Diabetes Brother   . Hypertension Other   . Diabetes Brother      Review of Systems:  See HPI for pertinent ROS. All other ROS negative.    Physical Exam: Blood pressure 116/70, pulse  56, temperature 98.3 F (36.8 C), temperature source Oral, resp. rate 18, height 5' 2.5" (1.588 m), weight 182 lb (82.555 kg)., Body mass index is 32.74 kg/(m^2). General:WNWD AAF Appears in no acute distress. Neck: Supple. No thyromegaly. No lymphadenopathy.No carotid bruit. Lungs: Clear bilaterally to auscultation without wheezes, rales, or rhonchi. Breathing is unlabored. Heart: RRR with S1 S2. No murmurs, rubs, or gallops. Abdomen: Soft, non-tender, non-distended with normoactive bowel sounds. No hepatomegaly. No rebound/guarding. No obvious abdominal masses. Musculoskeletal:  Strength and tone normal for age. Extremities/Skin: Warm and dry. No clubbing or cyanosis. No edema. No rashes or suspicious lesions. Neuro: Alert and oriented X 3. Moves all extremities spontaneously. Gait is normal. CNII-XII grossly in tact. Psych:  Responds to questions appropriately with a normal affect.     ASSESSMENT AND PLAN:  67 y.o. year old female with  1. Hypertension At goal. Continue current medication. - COMPLETE METABOLIC PANEL WITH GFR  2. Dyslipidemia The history of present illness. Lipitor 80 mg caused headache. Patiently currently taking Lipitor 40 mg and tolerates this well - COMPLETE METABOLIC PANEL WITH GFR - Lipid panel  3. Hypothyroidism - TSH  4. Smoker See history of present illness.  5. Congestive heart failure - Ambulatory referral to Cardiology At her last visit with me 11/25/2012 I did not have any cardiac history documented in her paper chart but then I saw this diagnosis in Epic. I Then read notes in Epic and discussed with patient. She saw Dr. Elease Hashimoto  in past secondary to palpitations. Also had chest pain and negative nuclear stress test March 2010. She knows nothing about the CHF and I do not see any actual test reports reg CHF but do see this in Dr. Merlyn Albert office visit note. She has no CHF symptoms or findings on exam. She is on Corag and ACE inhibitor. At the last  visit I told her to schedule followup with Dr. Elease Hashimoto. The last office visit note in Epic with him was 10/04/10. Today she says that she forgot all about initial and followup with Dr. Elease Hashimoto. I will schedule a referral. She is agreeable to followup.  #6 She sees gynecology for her breast exam, mammogram, pelvic exam Pap smear.  7  anoscopy: She states that she had one at age 34.                         Says she had a second one at age 77. This one was also normal  and was to repeat 10 years.  #8 immunizations: Tetanus: She states this is overdue. She is agreeable to get this today. Pneumonia vaccine: She says that she has never had one. She is now age 8. We'll give Prevnar 13 today. 6-12 months later she will need to get the Pneumovax 23. Zostavax: She says that she had this in the past with Dr. Larina Bras. Influenza vaccine: She defers.  Regular office visit 6 months or sooner as needed.  Signed, [address] [place], [place], [facility] 05/27/2013 10:15 AM

## 2013-06-28 ENCOUNTER — Ambulatory Visit (INDEPENDENT_AMBULATORY_CARE_PROVIDER_SITE_OTHER): Payer: Medicare Other | Admitting: Cardiovascular Disease

## 2013-06-28 ENCOUNTER — Encounter: Payer: Self-pay | Admitting: Cardiovascular Disease

## 2013-06-28 VITALS — BP 170/98 | HR 52 | Ht 64.5 in | Wt 185.8 lb

## 2013-06-28 DIAGNOSIS — I509 Heart failure, unspecified: Secondary | ICD-10-CM

## 2013-06-28 DIAGNOSIS — I10 Essential (primary) hypertension: Secondary | ICD-10-CM

## 2013-06-28 DIAGNOSIS — I5022 Chronic systolic (congestive) heart failure: Secondary | ICD-10-CM

## 2013-06-28 MED ORDER — POTASSIUM CHLORIDE CRYS ER 10 MEQ PO TBCR: 10.0000 meq | EXTENDED_RELEASE_TABLET | Freq: Every day | ORAL | Status: DC

## 2013-06-28 MED ORDER — HYDROCHLOROTHIAZIDE 25 MG PO TABS: 25.0000 mg | ORAL_TABLET | Freq: Every day | ORAL | Status: DC

## 2013-06-28 NOTE — Assessment & Plan Note (Signed)
Will add HCTZ 25 and KCL 10 a day. Check BMP in 3 weeks.  Ov in 3 months.

## 2013-06-28 NOTE — Progress Notes (Signed)
Adrienne [name] Date of Birth  1945-11-03 [facility] / [facility] [address]     [place], [place]  [address] [phone]  Fax  [phone]   History of Present Illness:  Adrienne [name] is a middle-age female with a history of hypertension, dyslipidemia, palpitations. She also has a history of chest pain. A nuclear stress test from March of 2010 was negative for ischemia.   She has not had episodes of chest pain or shortness of breath.  Jan. 5, 2015:  Adrienne [name] is seen today after a ~ 3 year absence.   She retired last year and tried to get life insurance.  She was told that she has "CHF" .    Her echo and myoview from 2010 showed mildly reduced EF .  The echo showed EF of 45-50%.  Myoview EF was 41%.    Her medication doses are slightly lower than when we last saw her 3 years ago. She has gradually reduced her dose because of chronic headaches.  She has been seeing a Karis Juba, NP  at [place].  She still smokes - 1/2 ppd currently.    She avoids salt.  Does not eat out much.      Current Outpatient Prescriptions  Medication Sig Dispense Refill  . amLODipine (NORVASC) 5 MG tablet Take 1 tablet (5 mg total) by mouth daily.  90 tablet  1  . atorvastatin (LIPITOR) 80 MG tablet take 40mg  at bedtime      . carvedilol (COREG) 3.125 MG tablet Take 1 tablet (3.125 mg total) by mouth 2 (two) times daily with a meal.  180 tablet  1  . levothyroxine (SYNTHROID, LEVOTHROID) 75 MCG tablet TAKE 1 TABLET (75 MCG TOTAL) BY MOUTH DAILY.  90 tablet  0  . lisinopril (PRINIVIL,ZESTRIL) 20 MG tablet Take 10 mg by mouth daily.      . Multiple Vitamins-Minerals (MULTIVITAMIN PO) Take 1 tablet by mouth daily. MVI w/Calcium      . Omega-3 Fatty Acids (FISH OIL PO) Take 1 tablet by mouth daily.       No current facility-administered medications for this visit.    No Known Allergies  Past Medical History  Diagnosis Date  . Hypertension   .  Thyroid disease   . Hyperlipidemia   . CHF (congestive heart failure)     EF 45-50%  . Smoker     Past Surgical History  Procedure Laterality Date  . Vaginal hysterectomy      1986  . Back surgery    . Cholecystectomy    . Right knee meniscus    . Polypectomy-oropharynx      History  Smoking status  . Current Every Day Smoker -- 1.00 packs/day for 1 years  . Types: Cigarettes  . Last Attempt to Quit: 05/24/2010  Smokeless tobacco  . Never Used    Comment: Started back smoking last year.    History  Alcohol Use No    Family History  Problem Relation Age of Onset  . Cancer Sister   . Diabetes Brother   . Hypertension Other   . Diabetes Brother     Reviw of Systems:  Reviewed in the history of present illness. All other systems are negative.  Physical Exam: BP 170/98  Pulse 52  Ht 5' 4.5" (1.638 m)  Wt 185 lb 12.8 oz (84.278 kg)  BMI 31.41 kg/m2 The patient is alert and oriented x 3.  The mood  and affect are normal.  The skin is warm and dry.  Color is normal.  The HEENT exam reveals that the sclera are nonicteric.  The mucous membranes are moist.  The carotids are 2+ without bruits.  There is no thyromegaly.  There is no JVD.  The lungs are clear.  The chest wall is non tender.  The heart exam reveals a regular rate with a normal S1 and S2.  There are no murmurs, gallops, or rubs.  The PMI is not displaced.   Abdominal exam reveals good bowel sounds.  There is no guarding or rebound.  There is no hepatosplenomegaly or tenderness.  There are no masses.  Exam of the legs reveal no clubbing, cyanosis, or edema.  The legs are without rashes.  The distal pulses are intact.  Cranial nerves II - XII are intact.  Motor and sensory functions are intact.  The gait is normal.  ECG: June 28, 2012: Sinus bradycardia at 52. She is non-specific ST abnormalities  which are unchanged from her previous EKG in 2012. Since her previous EKG, the PVCs have resolved. Assessment / Plan:

## 2013-06-28 NOTE — Patient Instructions (Signed)
Your physician has requested that you have an echocardiogram. Echocardiography is a painless test that uses sound waves to create images of your heart. It provides your doctor with information about the size and shape of your heart and how well your heart's chambers and valves are working. This procedure takes approximately one hour. There are no restrictions for this procedure.  Your physician recommends that you return for lab work in: [place] has recommended you make the following change in your medication:  START HCTZ 25 MG DAILY IN THE MORNING START POTASSIUM CHLORIDE 10 MEQ DAILY WITH THE HCTZ  Your physician recommends that you schedule a follow-up appointment in: 3 MONTHS

## 2013-06-28 NOTE — Assessment & Plan Note (Signed)
She has a history of chronic systolic congestive heart failure. Her previous ejection fraction is between 40 and 45 to percent depending on the modality. She's had long-standing hypertension. She's had a negative Myoview study in  2010.   her blood pressure continues to be elevated. We discussed the fact that her systolic congestive heart failure was probably due to her hypertension  Encouraged her to stop smoking  Encouraged her to lose weight.  We'll add hydrochlorothiazide 25 mg a day as well as potassium chloride 10 mg a day. We'll see her back in the office for blood work in 3 weeks. I'll see her in the office for followup visit in 3 months. We'll get an echocardiogram for further assessment of her left ventricular systolic function.

## 2013-07-05 ENCOUNTER — Other Ambulatory Visit: Payer: Self-pay | Admitting: Physician Assistant

## 2013-07-15 ENCOUNTER — Ambulatory Visit (HOSPITAL_COMMUNITY): Payer: Medicare Other | Attending: Cardiovascular Disease | Admitting: Radiology

## 2013-07-15 ENCOUNTER — Other Ambulatory Visit (INDEPENDENT_AMBULATORY_CARE_PROVIDER_SITE_OTHER): Payer: Medicare Other

## 2013-07-15 DIAGNOSIS — I10 Essential (primary) hypertension: Secondary | ICD-10-CM

## 2013-07-15 DIAGNOSIS — F17200 Nicotine dependence, unspecified, uncomplicated: Secondary | ICD-10-CM | POA: Insufficient documentation

## 2013-07-15 DIAGNOSIS — I5022 Chronic systolic (congestive) heart failure: Secondary | ICD-10-CM

## 2013-07-15 DIAGNOSIS — E039 Hypothyroidism, unspecified: Secondary | ICD-10-CM | POA: Insufficient documentation

## 2013-07-15 DIAGNOSIS — R079 Chest pain, unspecified: Secondary | ICD-10-CM | POA: Insufficient documentation

## 2013-07-15 DIAGNOSIS — I509 Heart failure, unspecified: Secondary | ICD-10-CM

## 2013-07-15 DIAGNOSIS — E785 Hyperlipidemia, unspecified: Secondary | ICD-10-CM | POA: Insufficient documentation

## 2013-07-15 DIAGNOSIS — I059 Rheumatic mitral valve disease, unspecified: Secondary | ICD-10-CM | POA: Insufficient documentation

## 2013-07-15 LAB — BASIC METABOLIC PANEL
BUN: 18 mg/dL (ref 6–23)
CALCIUM: 9.7 mg/dL (ref 8.4–10.5)
CHLORIDE: 103 meq/L (ref 96–112)
CO2: 31 meq/L (ref 19–32)
CREATININE: 1 mg/dL (ref 0.4–1.2)
GFR: 74.79 mL/min (ref >60.00)
GLUCOSE: 83 mg/dL (ref 70–99)
Potassium: 3.9 mEq/L (ref 3.5–5.1)
Sodium: 139 mEq/L (ref 135–145)

## 2013-07-15 NOTE — Progress Notes (Signed)
Echocardiogram performed.  

## 2013-07-20 ENCOUNTER — Other Ambulatory Visit (HOSPITAL_COMMUNITY): Payer: Medicare Other

## 2013-07-20 ENCOUNTER — Telehealth: Payer: Self-pay | Admitting: Cardiovascular Disease

## 2013-07-20 ENCOUNTER — Other Ambulatory Visit: Payer: Medicare Other

## 2013-07-20 NOTE — Telephone Encounter (Signed)
Echo/ bmet reviewed, pt verbalized understanding

## 2013-07-20 NOTE — Telephone Encounter (Signed)
New Problem:  Pt is calling to hear her recent  test results.

## 2013-07-21 ENCOUNTER — Telehealth: Payer: Self-pay | Admitting: Cardiovascular Disease

## 2013-07-21 NOTE — Telephone Encounter (Signed)
I reviewed results from echo and prior echo with pt. Her question is, does she have congestive heart failure? Please advise/ pt aware she may not hear back till Friday.

## 2013-07-21 NOTE — Telephone Encounter (Signed)
New message  Patient would like for you to give her call, she wouldn't give me any information. Other than she needed to speak with you.

## 2013-07-21 NOTE — Telephone Encounter (Signed)
She has a hx of CHF and will therefor always carry the dx fo "CHF".  Her LV function has improved since her previous studies and new we would call this well compensated systolic CHF.   Continue current meds

## 2013-07-22 NOTE — Telephone Encounter (Signed)
Pt was informed. She verbalized understanding.

## 2013-07-26 ENCOUNTER — Other Ambulatory Visit: Payer: Self-pay | Admitting: Physician Assistant

## 2013-07-26 DIAGNOSIS — E785 Hyperlipidemia, unspecified: Secondary | ICD-10-CM

## 2013-07-26 NOTE — Telephone Encounter (Signed)
Medication refilled per protocol. 

## 2013-08-12 ENCOUNTER — Encounter: Payer: Self-pay | Admitting: Physician Assistant

## 2013-08-12 ENCOUNTER — Ambulatory Visit (INDEPENDENT_AMBULATORY_CARE_PROVIDER_SITE_OTHER): Payer: Medicare Other | Admitting: Physician Assistant

## 2013-08-12 VITALS — BP 118/76 | HR 72 | Temp 98.3°F | Resp 18 | Ht 62.0 in | Wt 187.0 lb

## 2013-08-12 DIAGNOSIS — L84 Corns and callosities: Secondary | ICD-10-CM

## 2013-08-12 NOTE — Progress Notes (Signed)
    Patient ID: Adrienne [name] MRN: [id number], DOB: 1946/03/23, 68 y.o. Date of Encounter: 08/12/2013, 3:33 PM    Chief Complaint:  Chief Complaint  Patient presents with  . sore spot on bottom left foot     HPI: 68 y.o. year old AA female says that about 3 or 4 weeks ago she noticed a sore spot on her left foot. When she started to rub on that area and noticed that it was a hard lesion there. Wanted to get it checked out. Been applying Neosporin without any improvement.     Home Meds: See attached medication section for any medications that were entered at today's visit. The computer does not put those onto this list.The following list is a list of meds entered prior to today's visit.   Current Outpatient Prescriptions on File Prior to Visit  Medication Sig Dispense Refill  . amLODipine (NORVASC) 5 MG tablet TAKE 1 TABLET (5 MG TOTAL) BY MOUTH DAILY.  90 tablet  1  . atorvastatin (LIPITOR) 80 MG tablet TAKE 1 TABLET BY MOUTH AT BEDTIME  30 tablet  5  . carvedilol (COREG) 3.125 MG tablet Take 1 tablet (3.125 mg total) by mouth 2 (two) times daily with a meal.  180 tablet  1  . hydrochlorothiazide (HYDRODIURIL) 25 MG tablet Take 1 tablet (25 mg total) by mouth daily.  90 tablet  3  . levothyroxine (SYNTHROID, LEVOTHROID) 75 MCG tablet TAKE 1 TABLET (75 MCG TOTAL) BY MOUTH DAILY.  90 tablet  0  . lisinopril (PRINIVIL,ZESTRIL) 20 MG tablet Take 10 mg by mouth daily.      . Multiple Vitamins-Minerals (MULTIVITAMIN PO) Take 1 tablet by mouth daily. MVI w/Calcium      . Omega-3 Fatty Acids (FISH OIL PO) Take 1 tablet by mouth daily.      . potassium chloride (K-DUR,KLOR-CON) 10 MEQ tablet Take 1 tablet (10 mEq total) by mouth daily.  90 tablet  3   No current facility-administered medications on file prior to visit.    Allergies: No Known Allergies    Review of Systems: See HPI for pertinent ROS. All other ROS negative.    Physical Exam: Blood pressure 118/76, pulse 72,  temperature 98.3 F (36.8 C), temperature source Oral, resp. rate 18, height 5\' 2"  (1.575 m), weight 187 lb (84.823 kg)., Body mass index is 34.19 kg/(m^2). General:  WNWD AAF. Appears in no acute distress. Neck: Supple. No thyromegaly. No lymphadenopathy. Lungs: Clear bilaterally to auscultation without wheezes, rales, or rhonchi. Breathing is unlabored. Heart: Regular rhythm. No murmurs, rubs, or gallops. Msk:  Strength and tone normal for age. Extremities/Skin: Warm and dry. Left foot: Plantar surface: Lateral aspect, just proximal to the "ball" of the foot: there is an approximate 1 cm diameter area that is extremely firm with palpation. Neuro: Alert and oriented X 3. Moves all extremities spontaneously. Gait is normal. CNII-XII grossly in tact. Psych:  Responds to questions appropriately with a normal affect.     ASSESSMENT AND PLAN:  68 y.o. year old female with  1. Corn of foot Recommend that she buy Dr. Felicie Morn pads which she can get over-the-counter that will encircle the lesion and take pressure off of the lesion.  Referred to podiatry for excision. - Ambulatory referral to Podiatry   Signed, [address] [place], [place], [facility] 08/12/2013 3:33 PM

## 2013-10-07 ENCOUNTER — Other Ambulatory Visit: Payer: Self-pay | Admitting: Physician Assistant

## 2013-10-08 NOTE — Telephone Encounter (Signed)
Medication refilled per protocol. 

## 2013-11-09 ENCOUNTER — Other Ambulatory Visit: Payer: Self-pay | Admitting: Physician Assistant

## 2013-11-11 ENCOUNTER — Other Ambulatory Visit: Payer: Self-pay

## 2013-11-11 MED ORDER — LISINOPRIL 20 MG PO TABS: 10.0000 mg | ORAL_TABLET | Freq: Every day | ORAL | Status: DC

## 2013-12-01 NOTE — Telephone Encounter (Signed)
Please call patient and remind her to come in for fasting labs. 

## 2013-12-02 ENCOUNTER — Encounter: Payer: Self-pay | Admitting: Physician Assistant

## 2013-12-02 ENCOUNTER — Ambulatory Visit (INDEPENDENT_AMBULATORY_CARE_PROVIDER_SITE_OTHER): Payer: Medicare Other | Admitting: Physician Assistant

## 2013-12-02 ENCOUNTER — Ambulatory Visit: Payer: Medicare Other | Admitting: Physician Assistant

## 2013-12-02 VITALS — BP 146/86 | HR 52 | Temp 98.4°F | Resp 18 | Wt 183.0 lb

## 2013-12-02 DIAGNOSIS — F17200 Nicotine dependence, unspecified, uncomplicated: Secondary | ICD-10-CM

## 2013-12-02 DIAGNOSIS — E785 Hyperlipidemia, unspecified: Secondary | ICD-10-CM

## 2013-12-02 DIAGNOSIS — Z91148 Patient's other noncompliance with medication regimen for other reason: Secondary | ICD-10-CM | POA: Insufficient documentation

## 2013-12-02 DIAGNOSIS — Z91199 Patient's noncompliance with other medical treatment and regimen due to unspecified reason: Secondary | ICD-10-CM

## 2013-12-02 DIAGNOSIS — Z9114 Patient's other noncompliance with medication regimen: Secondary | ICD-10-CM | POA: Insufficient documentation

## 2013-12-02 DIAGNOSIS — Z9119 Patient's noncompliance with other medical treatment and regimen: Secondary | ICD-10-CM

## 2013-12-02 DIAGNOSIS — I10 Essential (primary) hypertension: Secondary | ICD-10-CM

## 2013-12-02 DIAGNOSIS — E039 Hypothyroidism, unspecified: Secondary | ICD-10-CM

## 2013-12-02 LAB — COMPLETE METABOLIC PANEL WITH GFR
ALT: 24 U/L (ref 0–35)
AST: 24 U/L (ref 0–37)
Albumin: 4 g/dL (ref 3.5–5.2)
Alkaline Phosphatase: 87 U/L (ref 39–117)
BUN: 10 mg/dL (ref 6–23)
CHLORIDE: 105 meq/L (ref 96–112)
CO2: 25 mEq/L (ref 19–32)
Calcium: 9.1 mg/dL (ref 8.4–10.5)
Creat: 0.8 mg/dL (ref 0.50–1.10)
GFR, Est African American: 89 mL/min
GFR, Est Non African American: 77 mL/min
Glucose, Bld: 102 mg/dL — ABNORMAL HIGH (ref 70–99)
Potassium: 4.1 mEq/L (ref 3.5–5.3)
Sodium: 140 mEq/L (ref 135–145)
TOTAL PROTEIN: 7 g/dL (ref 6.0–8.3)
Total Bilirubin: 0.8 mg/dL (ref 0.2–1.2)

## 2013-12-02 LAB — LIPID PANEL
Cholesterol: 203 mg/dL — ABNORMAL HIGH (ref 0–200)
HDL: 56 mg/dL (ref >39)
LDL CALC: 120 mg/dL — AB (ref 0–99)
Total CHOL/HDL Ratio: 3.6 Ratio
Triglycerides: 135 mg/dL (ref <150)
VLDL: 27 mg/dL (ref 0–40)

## 2013-12-02 LAB — TSH: TSH: 1.729 u[IU]/mL (ref 0.350–4.500)

## 2013-12-02 NOTE — Progress Notes (Signed)
Patient ID: Adrienne [name] MRN: [id number], DOB: 1945/12/13, 68 y.o. Date of Encounter: @DATE @  Chief Complaint:  Chief Complaint  Patient presents with  . 6 month check up    is fasting, has not followup with cardiology of taken meds correctly    HPI: 68 y.o. year old AA female  presents for routine f/u of hypertension, hyperlipidemia, hypothyroidism.  The nurse today made me aware of the fact that patient has not been taking medications as directed by the cardiologist and has not followed up with a cardiologist as directed. I discussed this with the patient again. She says that she was given a 90 day supply of HCTZ and potassium. Initially, she says that she was given this 90 day supply and then she had followup tests and they were normal so she felt that she no longer needed the medicines after the 90s they supply was over. Then I explained to her that she was given these medications because her blood pressure was elevated at that office visit and that regardless of the test results she needed to be taking these medicines. Then she tells me that she is "taking enough pills".  She says that she" wants to see him (the cardiologist) again before she starts it." Told her that her blood pressure is high again today and that she needs the medicine. Then she says is high today because there has been stress going on at home. Then I told her that it was also high at the cardiologist's last appointment. At the end of the conversation she refused to restart the medicines at this time and promises that she will followup with Dr. Acie Fredrickson.  She says that she is taking her thyroid medicine daily. says she is taking her cholesterol medicine daily. Having no myalgias or other adverse effects.    Past Medical History  Diagnosis Date  . Hypertension   . Thyroid disease   . Hyperlipidemia   . CHF (congestive heart failure)     EF 45-50%  . Smoker      Home Meds: Outpatient Prescriptions  Prior to Visit  Medication Sig Dispense Refill  . amLODipine (NORVASC) 5 MG tablet TAKE 1 TABLET (5 MG TOTAL) BY MOUTH DAILY.  90 tablet  1  . atorvastatin (LIPITOR) 80 MG tablet TAKE 1 TABLET BY MOUTH AT BEDTIME  30 tablet  5  . carvedilol (COREG) 3.125 MG tablet TAKE 1 TABLET (3.125 MG TOTAL) BY MOUTH 2 (TWO) TIMES DAILY WITH A MEAL.  180 tablet  1  . levothyroxine (SYNTHROID, LEVOTHROID) 75 MCG tablet TAKE 1 TABLET (75 MCG TOTAL) BY MOUTH DAILY.  90 tablet  0  . lisinopril (PRINIVIL,ZESTRIL) 20 MG tablet Take 0.5 tablets (10 mg total) by mouth daily.  30 tablet  0  . Multiple Vitamins-Minerals (MULTIVITAMIN PO) Take 1 tablet by mouth daily. MVI w/Calcium      . Omega-3 Fatty Acids (FISH OIL PO) Take 1 tablet by mouth daily.      . hydrochlorothiazide (HYDRODIURIL) 25 MG tablet Take 1 tablet (25 mg total) by mouth daily.  90 tablet  3  . potassium chloride (K-DUR,KLOR-CON) 10 MEQ tablet Take 1 tablet (10 mEq total) by mouth daily.  90 tablet  3   No facility-administered medications prior to visit.    Allergies: No Known Allergies  History   Social History  . Marital Status: Widowed    Spouse Name: N/A    Number of Children: N/A  . Years of  Education: N/A   Occupational History  . Not on file.   Social History Main Topics  . Smoking status: Current Every Day Smoker -- 0.50 packs/day for 1 years    Types: Cigarettes  . Smokeless tobacco: Never Used     Comment: Started back smoking last year.  . Alcohol Use: No  . Drug Use: No  . Sexual Activity: Not Currently    Birth Control/ Protection: Surgical   Other Topics Concern  . Not on file   Social History Narrative  . No narrative on file    Family History  Problem Relation Age of Onset  . Cancer Sister   . Diabetes Brother   . Hypertension Other   . Diabetes Brother      Review of Systems:  See HPI for pertinent ROS. All other ROS negative.    Physical Exam: Blood pressure 146/86, pulse 52, temperature 98.4 F  (36.9 C), temperature source Oral, resp. rate 18, weight 183 lb (83.008 kg)., Body mass index is 33.46 kg/(m^2). General: WNWD AAF. Appears in no acute distress. Neck: Supple. No thyromegaly. No lymphadenopathy. No carotid bruit. Lungs: Clear bilaterally to auscultation without wheezes, rales, or rhonchi. Breathing is unlabored. Heart: RRR with S1 S2. No murmurs, rubs, or gallops. Abdomen: Soft, non-tender, non-distended with normoactive bowel sounds. No hepatomegaly. No rebound/guarding. No obvious abdominal masses. Musculoskeletal:  Strength and tone normal for age. Extremities/Skin: Warm and dry. No clubbing or cyanosis. No edema. No rashes or suspicious lesions. Neuro: Alert and oriented X 3. Moves all extremities spontaneously. Gait is normal. CNII-XII grossly in tact. Psych:  Responds to questions appropriately with a normal affect.     ASSESSMENT AND PLAN:  68 y.o. year old female with  1. Hypertension See HPI !!! - COMPLETE METABOLIC PANEL WITH GFR  2. Dyslipidemia - COMPLETE METABOLIC PANEL WITH GFR - Lipid panel  3. Hypothyroidism - TSH  4. Smoker I HAVE ADDED MEDICATION NONCOMPLIANCE TO HER PROBLEM LIST TODAY !!!!!!! I COPIED BELOW INFORMATION FROM MY OV NOTE DATED 05/27/2013: 4- hyperlipidemia: In the past she had extremely high cholesterol with LDL 201. However she was repeatedly noncompliant with medications. At her last visit with me June 2014 I asked her why she was repeatedly noncompliant with these medicines. She said "I don't want to take another pill. If I start a pill I will have to take it the rest of my Life." at that time I discussed the risk of MI stroke death versus taking a pill. She was agreeable to take the medication.  SHe had repeat lipid panel done at that visit in June 2014. LDL was 208. I prescribed Lipitor 80 mg. She says that when she took that it caused a significant headache. She cut the pill in half. She is taking Lipitor 40 mg daily with no  adverse effects.  2. Dyslipidemia  The history of present illness. Lipitor 80 mg caused headache. Patiently currently taking Lipitor 40 mg and tolerates this well  - COMPLETE METABOLIC PANEL WITH GFR  - Lipid panel  3. Hypothyroidism  - TSH  4. Smoker  See history of present illness.  5. Congestive heart failure  - Ambulatory referral to Cardiology  At her last visit with me 11/25/2012 I did not have any cardiac history documented in her paper chart but then I saw this diagnosis in Epic. I Then read notes in Epic and discussed with patient.  She saw Dr. Acie Fredrickson in past secondary to palpitations. Also had chest pain  and negative nuclear stress test March 2010. She knows nothing about the CHF and I do not see any actual test reports reg CHF but do see this in Dr. Otho Najjar office visit note.  She has no CHF symptoms or findings on exam. She is on Corag and ACE inhibitor.  At the last visit I told her to schedule followup with Dr. Acie Fredrickson.  The last office visit note in Epic with him was 10/04/10.  Today she says that she forgot all about initial and followup with Dr. Acie Fredrickson.  I will schedule a referral. She is agreeable to followup.  #6  She sees gynecology for her breast exam, mammogram, pelvic exam Pap smear.  7 anoscopy: She states that she had one at age 28.  Says she had a second one at age 59. This one was also normal and was to repeat 10 years.  #8 immunizations:  Tetanus: She states this is overdue. She is agreeable to get this today.  Pneumonia vaccine: She says that she has never had one. She is now age 13. We'll give Prevnar 70 today. 6-12 months later she will need to get the Pneumovax 23.  Zostavax: She says that she had this in the past with Dr. Joaquim Lai.  Influenza vaccine: She defers.   She is to schedule followup with Dr. Acie Fredrickson. She is to followup here in 6 months or sooner if needed.   Signed, [address] [place], [place], [facility] 12/02/2013 11:18 AM

## 2013-12-03 ENCOUNTER — Other Ambulatory Visit: Payer: Self-pay | Admitting: Physician Assistant

## 2013-12-03 ENCOUNTER — Other Ambulatory Visit: Payer: Self-pay | Admitting: Cardiovascular Disease

## 2013-12-03 DIAGNOSIS — Z1231 Encounter for screening mammogram for malignant neoplasm of breast: Secondary | ICD-10-CM

## 2013-12-06 ENCOUNTER — Ambulatory Visit (HOSPITAL_COMMUNITY)
Admission: RE | Admit: 2013-12-06 | Discharge: 2013-12-06 | Disposition: A | Discharge status: Home / Self Care | Payer: Medicare Other | Source: Ambulatory Visit | Attending: Physician Assistant | Admitting: Physician Assistant

## 2013-12-06 DIAGNOSIS — Z1231 Encounter for screening mammogram for malignant neoplasm of breast: Secondary | ICD-10-CM | POA: Insufficient documentation

## 2013-12-30 ENCOUNTER — Encounter: Payer: Self-pay | Admitting: Obstetrics and Gynecology

## 2013-12-30 ENCOUNTER — Ambulatory Visit (INDEPENDENT_AMBULATORY_CARE_PROVIDER_SITE_OTHER): Payer: Medicare Other | Admitting: Obstetrics and Gynecology

## 2013-12-30 VITALS — BP 110/70 | Ht 64.0 in | Wt 181.0 lb

## 2013-12-30 DIAGNOSIS — Z01419 Encounter for gynecological examination (general) (routine) without abnormal findings: Secondary | ICD-10-CM

## 2013-12-30 DIAGNOSIS — Z90710 Acquired absence of both cervix and uterus: Secondary | ICD-10-CM | POA: Diagnosis not present

## 2013-12-30 NOTE — Progress Notes (Signed)
This chart was scribed by Ludger Nutting, Medical Scribe, for Dr. Mallory Shirk on 12/30/13 at 9:44 AM. This chart was reviewed by Dr. Mallory Shirk for accuracy.   Patient ID: Adrienne [name], female   DOB: [birth year], 68 y.o.   MRN: [id number]  Assessment:  Annual Gyn Exam   Plan:  1. Pelvic exam done, next due 1 year 2. return annually or prn 3    Annual mammogram advised Subjective:  Adrienne [name] is a 68 y.o. female [phone] who presents for annual exam. No LMP recorded. Patient has had a hysterectomy. Pt here today for annual exam. Pt denies any problems or concerns at this time.  The patient has no complaints today. She denies vaginal dryness or bladder-related issues.   The following portions of the patient's history were reviewed and updated as appropriate: allergies, current medications, past family history, past medical history, past social history, past surgical history and problem list.  Review of Systems Constitutional: negative Gastrointestinal: negative Genitourinary: negative   Objective:  BP 110/70  Ht 5\' 4"  (1.626 m)  Wt 181 lb (82.101 kg)  BMI 31.05 kg/m2   BMI: Body mass index is 31.05 kg/(m^2).  General Appearance: Alert, appropriate appearance for age. No acute distress HEENT: Grossly normal Neck / Thyroid:  Cardiovascular: RRR; normal S1, S2, no murmur Lungs: CTA bilaterally Back: No CVAT Breast Exam: No dimpling, nipple retraction or discharge. No masses or nodes., Normal to inspection, Normal breast tissue bilaterally and No masses or nodes.No dimpling, nipple retraction or discharge. Gastrointestinal: Soft, non-tender, no masses or organomegaly Pelvic Exam: External genitalia: normal general appearance Vaginal: normal without tenderness, induration or masses and atrophic mucosa Cervix: removed surgically Adnexa: normal bimanual exam Uterus: removed surgically Rectovaginal: normal rectal, no masses and guaiac negative stool obtained Lymphatic Exam:  Non-palpable nodes in neck, clavicular, axillary, or inguinal regions  Skin: no rash or abnormalities Neurologic: Normal gait and speech, no tremor  Psychiatric: Alert and oriented, appropriate affect.  Urinalysis:Not done  Mallory Shirk. MD Pgr [phone] 9:20 AM

## 2013-12-30 NOTE — Patient Instructions (Signed)
Adrienne [name] will assist you with scheduling your dexa scan at [facility].

## 2013-12-31 ENCOUNTER — Encounter: Payer: Self-pay | Admitting: Cardiovascular Disease

## 2013-12-31 ENCOUNTER — Ambulatory Visit (INDEPENDENT_AMBULATORY_CARE_PROVIDER_SITE_OTHER): Payer: Medicare Other | Admitting: Cardiovascular Disease

## 2013-12-31 VITALS — BP 140/78 | HR 54 | Ht 64.0 in | Wt 180.8 lb

## 2013-12-31 DIAGNOSIS — I5022 Chronic systolic (congestive) heart failure: Secondary | ICD-10-CM

## 2013-12-31 DIAGNOSIS — I509 Heart failure, unspecified: Secondary | ICD-10-CM

## 2013-12-31 DIAGNOSIS — F17200 Nicotine dependence, unspecified, uncomplicated: Secondary | ICD-10-CM

## 2013-12-31 NOTE — Progress Notes (Signed)
Adrienne [name] Date of Birth  [birth year] [facility] / [facility] [address]     [place], [place]  [address] [phone]  Fax  [phone]   History of Present Illness:  Adrienne [name] is a middle-age female with a history of hypertension, dyslipidemia, palpitations. She also has a history of chest pain. A nuclear stress test from March of 2010 was negative for ischemia.   She has not had episodes of chest pain or shortness of breath.  Jan. 5, 2015:  Adrienne [name] is seen today after a ~ 3 year absence.   She retired last year and tried to get life insurance.  She was told that she has "CHF" .    Her echo and myoview from 2010 showed mildly reduced EF .  The echo showed EF of 45-50%.  Myoview EF was 41%.    Her medication doses are slightly lower than when we last saw her 3 years ago. She has gradually reduced her dose because of chronic headaches.  She has been seeing a Karis Juba, NP  at [facility].  She still smokes - 1/2 ppd currently.    She avoids salt.  Does not eat out much.    December 31, 2013:  Adrienne [name] is doing ok.  BP is much better than previous visit. BP at her medical doctors office . She stopped taking the HCTZ and potassium when she ran out of her prescription. Her blood pressure is better despite the fact that she's not on those medications,  .  She is planning on moving to [facility] within the next several months.    Current Outpatient Prescriptions  Medication Sig Dispense Refill  . amLODipine (NORVASC) 5 MG tablet TAKE 1 TABLET (5 MG TOTAL) BY MOUTH DAILY.  90 tablet  1  . atorvastatin (LIPITOR) 80 MG tablet TAKE 1 TABLET BY MOUTH AT BEDTIME  30 tablet  5  . carvedilol (COREG) 3.125 MG tablet TAKE 1 TABLET (3.125 MG TOTAL) BY MOUTH 2 (TWO) TIMES DAILY WITH A MEAL.  180 tablet  1  . levothyroxine (SYNTHROID, LEVOTHROID) 75 MCG tablet TAKE 1 TABLET (75 MCG TOTAL) BY MOUTH DAILY.  90 tablet  0  . lisinopril (PRINIVIL,ZESTRIL) 20  MG tablet TAKE 1/2 TABLET DAILY  30 tablet  0  . Multiple Vitamins-Minerals (MULTIVITAMIN PO) Take 1 tablet by mouth daily. MVI w/Calcium      . Omega-3 Fatty Acids (FISH OIL PO) Take 1 tablet by mouth daily.       No current facility-administered medications for this visit.    No Known Allergies  Past Medical History  Diagnosis Date  . Hypertension   . Thyroid disease   . Hyperlipidemia   . CHF (congestive heart failure)     EF 45-50%  . Smoker     Past Surgical History  Procedure Laterality Date  . Vaginal hysterectomy      1986  . Back surgery    . Cholecystectomy    . Right knee meniscus    . Polypectomy-oropharynx      History  Smoking status  . Current Every Day Smoker -- 0.75 packs/day for 1 years  . Types: Cigarettes  Smokeless tobacco  . Never Used    Comment: Started back smoking last year.    History  Alcohol Use No    Family History  Problem Relation Age of Onset  . Cancer Sister   . Diabetes Brother   . Hypertension  Other   . Diabetes Brother     Reviw of Systems:  Reviewed in the history of present illness. All other systems are negative.  Physical Exam: BP 140/78  Pulse 54  Ht 5\' 4"  (1.626 m)  Wt 180 lb 12.8 oz (82.01 kg)  BMI 31.02 kg/m2 The patient is alert and oriented x 3.  The mood and affect are normal.  The skin is warm and dry.  Color is normal.  The HEENT exam reveals that the sclera are nonicteric.  The mucous membranes are moist.  The carotids are 2+ without bruits.  There is no thyromegaly.  There is no JVD.  The lungs are clear.  The chest wall is non tender.  The heart exam reveals a regular rate with a normal S1 and S2.  There are no murmurs, gallops, or rubs.  The PMI is not displaced.   Abdominal exam reveals good bowel sounds.  There is no guarding or rebound.  There is no hepatosplenomegaly or tenderness.  There are no masses.  Exam of the legs reveal no clubbing, cyanosis, or edema.  The legs are without rashes.  The distal  pulses are intact.  Cranial nerves II - XII are intact.  Motor and sensory functions are intact.  The gait is normal.  ECG: June 28, 2012: Sinus bradycardia at 52. She is non-specific ST abnormalities  which are unchanged from her previous EKG in 2012. Since her previous EKG, the PVCs have resolved. Assessment / Plan:

## 2013-12-31 NOTE — Assessment & Plan Note (Signed)
I've encouraged her to stop smoking. 

## 2013-12-31 NOTE — Assessment & Plan Note (Signed)
Adrienne [name] is doing very well. Her left ventricular systolic function by echo in January revealed normal LV function.  She stopped taking the HCTZ and potassium after she ran out after the 90 day supply. She did not know that she needs refill the medication. Despite that, her blood pressure is greatly improved. Her blood pressure at her medical doctor's office yesterday and was in the normal range.  Once again encouraged her to stop smoking. I've encouraged her to work on her diet and exercise program. At this point I think it we do not need to add HCTZ. I'll see her in 6 months.

## 2013-12-31 NOTE — Patient Instructions (Signed)
Your physician recommends that you continue on your current medications as directed. Please refer to the Current Medication list given to you today.  Your physician wants you to follow-up in: 6 months with Dr. Nahser.  You will receive a reminder letter in the mail two months in advance. If you don't receive a letter, please call our office to schedule the follow-up appointment.  

## 2014-01-04 ENCOUNTER — Ambulatory Visit (HOSPITAL_COMMUNITY)
Admission: RE | Admit: 2014-01-04 | Discharge: 2014-01-04 | Disposition: A | Discharge status: Home / Self Care | Payer: Medicare Other | Source: Ambulatory Visit | Attending: Obstetrics and Gynecology | Admitting: Obstetrics and Gynecology

## 2014-01-04 DIAGNOSIS — Z01419 Encounter for gynecological examination (general) (routine) without abnormal findings: Secondary | ICD-10-CM | POA: Insufficient documentation

## 2014-01-13 ENCOUNTER — Other Ambulatory Visit: Payer: Self-pay | Admitting: Physician Assistant

## 2014-01-13 NOTE — Telephone Encounter (Signed)
Medication refilled per protocol. 

## 2014-02-10 ENCOUNTER — Encounter: Payer: Self-pay | Admitting: Obstetrics and Gynecology

## 2014-02-19 ENCOUNTER — Other Ambulatory Visit: Payer: Self-pay | Admitting: Physician Assistant

## 2014-02-19 NOTE — Telephone Encounter (Signed)
Refill appropriate and filled per protocol. 

## 2014-02-26 ENCOUNTER — Other Ambulatory Visit: Payer: Self-pay | Admitting: Cardiovascular Disease

## 2014-03-03 ENCOUNTER — Telehealth: Payer: Self-pay | Admitting: *Deleted

## 2014-03-03 DIAGNOSIS — E785 Hyperlipidemia, unspecified: Secondary | ICD-10-CM

## 2014-03-03 MED ORDER — ATORVASTATIN CALCIUM 80 MG PO TABS: ORAL_TABLET | ORAL | Status: DC

## 2014-03-03 NOTE — Telephone Encounter (Signed)
Medication sent to pharmacy with 90 day supply

## 2014-03-08 ENCOUNTER — Other Ambulatory Visit: Payer: Self-pay | Admitting: Physician Assistant

## 2014-04-25 ENCOUNTER — Encounter: Payer: Self-pay | Admitting: Cardiovascular Disease

## 2014-06-02 ENCOUNTER — Encounter: Payer: Self-pay | Admitting: Physician Assistant

## 2014-06-02 ENCOUNTER — Encounter: Payer: Self-pay | Admitting: Family Medicine

## 2014-06-02 ENCOUNTER — Ambulatory Visit (INDEPENDENT_AMBULATORY_CARE_PROVIDER_SITE_OTHER): Payer: Medicare Other | Admitting: Physician Assistant

## 2014-06-02 VITALS — BP 142/84 | HR 56 | Temp 98.2°F | Resp 18 | Wt 180.0 lb

## 2014-06-02 DIAGNOSIS — Z9114 Patient's other noncompliance with medication regimen: Secondary | ICD-10-CM

## 2014-06-02 DIAGNOSIS — I5022 Chronic systolic (congestive) heart failure: Secondary | ICD-10-CM

## 2014-06-02 DIAGNOSIS — Z91148 Patient's other noncompliance with medication regimen for other reason: Secondary | ICD-10-CM

## 2014-06-02 DIAGNOSIS — F17200 Nicotine dependence, unspecified, uncomplicated: Secondary | ICD-10-CM

## 2014-06-02 DIAGNOSIS — I10 Essential (primary) hypertension: Secondary | ICD-10-CM

## 2014-06-02 DIAGNOSIS — Z23 Encounter for immunization: Secondary | ICD-10-CM

## 2014-06-02 DIAGNOSIS — E039 Hypothyroidism, unspecified: Secondary | ICD-10-CM

## 2014-06-02 DIAGNOSIS — Z720 Tobacco use: Secondary | ICD-10-CM

## 2014-06-02 DIAGNOSIS — E785 Hyperlipidemia, unspecified: Secondary | ICD-10-CM

## 2014-06-02 LAB — COMPLETE METABOLIC PANEL WITH GFR
ALT: 20 U/L (ref 0–35)
AST: 20 U/L (ref 0–37)
Albumin: 3.9 g/dL (ref 3.5–5.2)
Alkaline Phosphatase: 88 U/L (ref 39–117)
BILIRUBIN TOTAL: 0.4 mg/dL (ref 0.2–1.2)
BUN: 14 mg/dL (ref 6–23)
CO2: 26 mEq/L (ref 19–32)
CREATININE: 0.76 mg/dL (ref 0.50–1.10)
Calcium: 9.5 mg/dL (ref 8.4–10.5)
Chloride: 108 mEq/L (ref 96–112)
GFR, Est African American: >89 mL/min
GFR, Est Non African American: 82 mL/min
Glucose, Bld: 102 mg/dL — ABNORMAL HIGH (ref 70–99)
Potassium: 4.1 mEq/L (ref 3.5–5.3)
Sodium: 141 mEq/L (ref 135–145)
Total Protein: 7.2 g/dL (ref 6.0–8.3)

## 2014-06-02 LAB — LIPID PANEL
Cholesterol: 179 mg/dL (ref 0–200)
HDL: 47 mg/dL (ref >39)
LDL CALC: 112 mg/dL — AB (ref 0–99)
Total CHOL/HDL Ratio: 3.8 Ratio
Triglycerides: 102 mg/dL (ref <150)
VLDL: 20 mg/dL (ref 0–40)

## 2014-06-02 LAB — TSH: TSH: 3.153 u[IU]/mL (ref 0.350–4.500)

## 2014-06-02 NOTE — Addendum Note (Signed)
Addended by: Olena Mater on: 06/02/2014 11:08 AM   Modules accepted: Orders

## 2014-06-02 NOTE — Progress Notes (Signed)
Patient ID: Adrienne [name] MRN: [id number], DOB: 22-Nov-1945, 68 y.o. Date of Encounter: @DATE @  Chief Complaint:  Chief Complaint  Patient presents with  . 6 mth check up    is fasting    HPI: 68 y.o. year old AA female  presents for routine f/u of hypertension, hyperlipidemia, hypothyroidism.  At [place] 12/02/2013-- The nurse today made me aware of the fact that patient has not been taking medications as directed by the cardiologist and has not followed up with a cardiologist as directed. I discussed this with the patient again. She says that she was given a 90 day supply of HCTZ and potassium. Initially, she says that she was given this 90 day supply and then she had followup tests and they were normal so she felt that she no longer needed the medicines after the 90s they supply was over. Then I explained to her that she was given these medications because her blood pressure was elevated at that office visit and that regardless of the test results she needed to be taking these medicines. Then she tells me that she is "taking enough pills".  She says that she" wants to see him (the cardiologist) again before she starts it." Told her that her blood pressure is high again today and that she needs the medicine. Then she says is high today because there has been stress going on at home. Then I told her that it was also high at the cardiologist's last appointment. At the end of the conversation she refused to restart the medicines at this time and promises that she will followup with Dr. Acie Fredrickson.  At [place] 05/2014-- She says she did see Dr. Duanne Limerick she IS taking all meds that are on med list.   She says that she is taking her thyroid medicine daily. says she is taking her cholesterol medicine daily. Having no myalgias or other adverse effects.    Past Medical History  Diagnosis Date  . Hypertension   . Thyroid disease   . Hyperlipidemia   . CHF (congestive heart failure)     EF  45-50%  . Smoker      Home Meds: Outpatient Prescriptions Prior to Visit  Medication Sig Dispense Refill  . amLODipine (NORVASC) 5 MG tablet TAKE 1 TABLET (5 MG TOTAL) BY MOUTH DAILY. 90 tablet 1  . atorvastatin (LIPITOR) 80 MG tablet TAKE 1 TABLET BY MOUTH AT BEDTIME 90 tablet 3  . carvedilol (COREG) 3.125 MG tablet TAKE 1 TABLET (3.125 MG TOTAL) BY MOUTH 2 (TWO) TIMES DAILY WITH A MEAL. 180 tablet 1  . levothyroxine (SYNTHROID, LEVOTHROID) 75 MCG tablet TAKE 1 TABLET (75 MCG TOTAL) BY MOUTH DAILY. 90 tablet 1  . lisinopril (PRINIVIL,ZESTRIL) 20 MG tablet TAKE 1/2 TABLET DAILY 30 tablet 3  . Multiple Vitamins-Minerals (MULTIVITAMIN PO) Take 1 tablet by mouth daily. MVI w/Calcium    . Omega-3 Fatty Acids (FISH OIL PO) Take 1 tablet by mouth daily.     No facility-administered medications prior to visit.    Allergies: No Known Allergies  History   Social History  . Marital Status: Widowed    Spouse Name: N/A    Number of Children: N/A  . Years of Education: N/A   Occupational History  . Not on file.   Social History Main Topics  . Smoking status: Current Every Day Smoker -- 0.75 packs/day for 1 years    Types: Cigarettes  . Smokeless tobacco: Never Used  Comment: Started back smoking last year.  . Alcohol Use: No  . Drug Use: No  . Sexual Activity: Yes    Birth Control/ Protection: Surgical   Other Topics Concern  . Not on file   Social History Narrative    Family History  Problem Relation Age of Onset  . Cancer Sister   . Diabetes Brother   . Hypertension Other   . Diabetes Brother      Review of Systems:  See HPI for pertinent ROS. All other ROS negative.    Physical Exam: Blood pressure 142/84, pulse 56, temperature 98.2 F (36.8 C), temperature source Oral, resp. rate 18, weight 180 lb (81.647 kg)., Body mass index is 30.88 kg/(m^2). General: WNWD AAF. Appears in no acute distress. Neck: Supple. No thyromegaly. No lymphadenopathy. No carotid  bruit. Lungs: Clear bilaterally to auscultation without wheezes, rales, or rhonchi. Breathing is unlabored. Heart: RRR with S1 S2. No murmurs, rubs, or gallops. Abdomen: Soft, non-tender, non-distended with normoactive bowel sounds. No hepatomegaly. No rebound/guarding. No obvious abdominal masses. Musculoskeletal:  Strength and tone normal for age. Extremities/Skin: Warm and dry. No clubbing or cyanosis. No edema. No rashes or suspicious lesions. Neuro: Alert and oriented X 3. Moves all extremities spontaneously. Gait is normal. CNII-XII grossly in tact. Psych:  Responds to questions appropriately with a normal affect.     ASSESSMENT AND PLAN:  68 y.o. year old female with  1. Hypertension She is taking meds as directed.  - COMPLETE METABOLIC PANEL WITH GFR  2. Dyslipidemia - COMPLETE METABOLIC PANEL WITH GFR - Lipid panel  3. Hypothyroidism - TSH  4. Smoker  5. MEDICATION NONCOMPLIANCE -- SEE HPI REGARDING BP MEDS IN PAST. SEE #4 BELOW REGARDING CHOLESTEROL MEDS IN PAST !!!  I COPIED BELOW INFORMATION FROM MY OV NOTE DATED 05/27/2013: 4- hyperlipidemia: In the past she had extremely high cholesterol with LDL 201. However she was repeatedly noncompliant with medications. At her last visit with me June 2014 I asked her why she was repeatedly noncompliant with these medicines. She said "I don't want to take another pill. If I start a pill I will have to take it the rest of my Life." at that time I discussed the risk of MI stroke death versus taking a pill. She was agreeable to take the medication.  SHe had repeat lipid panel done at that visit in June 2014. LDL was 208. I prescribed Lipitor 80 mg. She says that when she took that it caused a significant headache. She cut the pill in half.  She is taking Lipitor 40 mg daily with no adverse effects.   Dyslipidemia  The history of present illness. Lipitor 80 mg caused headache. Patiently currently taking Lipitor 40 mg and tolerates this  well  - COMPLETE METABOLIC PANEL WITH GFR  - Lipid panel   5. Congestive heart failure  - Ambulatory referral to Cardiology  At her last visit with me 11/25/2012 I did not have any cardiac history documented in her paper chart but then I saw this diagnosis in Epic. I Then read notes in Epic and discussed with patient.  She saw Dr. Acie Fredrickson in past secondary to palpitations. Also had chest pain and negative nuclear stress test March 2010. She knows nothing about the CHF and I do not see any actual test reports reg CHF but do see this in Dr. Elmarie Shiley office visit note.  She has no CHF symptoms or findings on exam. She is on Coreg and ACE inhibitor.  At 05/2013 office visit I told her to schedule followup with Dr. Acie Fredrickson.  The last office visit note in Epic with him was 10/04/10.  At 11/2013 OV she said that she forgot all about initial and followup with Dr. Acie Fredrickson.  At 11/2013 I scheduled referral. She was agreeable to followup.  At [place] 05/2014 she says she did f/u with Dr. Acie Fredrickson and she is taking meds as directed.   #6  She sees gynecology for her breast exam, mammogram, pelvic exam Pap smear.   7 Colonoscopy: She states that she had one at age 26.  Says she had a second one at age 48. This one was also normal and was to repeat 10 years.  Says it was with [facility]  #8 immunizations:  Tetanus: Given here 05/2013 Pneumonia vaccine: Prevnar 13 --given here 05/2013.  Pneumovax 23. --given here 05/2014 Zostavax: She says that she had this in the past with Dr. Joaquim Lai.  Influenza vaccine: She defers.  At end of visit she says that she has been having pain in her left shoulder for the past couple of months. Says that it is difficult for her to abduct her left shoulder secondary to pain there. I gave her a handout that includes stretches and exercises to do at home. Told her to do these twice daily for the next 2 weeks. Told her that if at that point she is not having any improvement in her  symptoms then she can call me and I will do referral.    She is to followup here in 6 months or sooner if needed.   [address] [place], [place], [facility] 06/02/2014 9:36 AM

## 2014-06-08 ENCOUNTER — Encounter: Payer: Self-pay | Admitting: *Deleted

## 2014-06-22 ENCOUNTER — Ambulatory Visit: Payer: Medicare Other | Admitting: Family Medicine

## 2014-06-23 ENCOUNTER — Encounter: Payer: Self-pay | Admitting: Family Medicine

## 2014-06-23 ENCOUNTER — Ambulatory Visit (INDEPENDENT_AMBULATORY_CARE_PROVIDER_SITE_OTHER): Payer: Medicare Other | Admitting: Family Medicine

## 2014-06-23 VITALS — BP 138/78 | HR 66 | Resp 14 | Ht 64.0 in | Wt 176.0 lb

## 2014-06-23 DIAGNOSIS — M7582 Other shoulder lesions, left shoulder: Secondary | ICD-10-CM

## 2014-06-23 NOTE — Progress Notes (Signed)
Subjective:    Patient ID: Adrienne [name], female    DOB: May 26, 1946, 68 y.o.   MRN: [id number]  HPI Patient is a very pleasant 68 year old African-American female who has had severe pain in her left shoulder for almost 2 months. She is unable to abduct her shoulder greater than 45 without severe pain. Passively I can abduct her shoulder only 220 before she feels severe pain. She reports pain with external rotation. There is no pain with internal rotation. The pain aches and throbs. She has failed conservative therapy including anti-inflammatories and home physical therapy exercises. She is requesting a cortisone injection. Past Medical History  Diagnosis Date  . Hypertension   . Thyroid disease   . Hyperlipidemia   . CHF (congestive heart failure)     EF 45-50%  . Smoker    Past Surgical History  Procedure Laterality Date  . Vaginal hysterectomy      1986  . Back surgery    . Cholecystectomy    . Right knee meniscus    . Polypectomy-oropharynx     Current Outpatient Prescriptions on File Prior to Visit  Medication Sig Dispense Refill  . amLODipine (NORVASC) 5 MG tablet TAKE 1 TABLET (5 MG TOTAL) BY MOUTH DAILY. 90 tablet 1  . atorvastatin (LIPITOR) 80 MG tablet TAKE 1 TABLET BY MOUTH AT BEDTIME 90 tablet 3  . carvedilol (COREG) 3.125 MG tablet TAKE 1 TABLET (3.125 MG TOTAL) BY MOUTH 2 (TWO) TIMES DAILY WITH A MEAL. 180 tablet 1  . levothyroxine (SYNTHROID, LEVOTHROID) 75 MCG tablet TAKE 1 TABLET (75 MCG TOTAL) BY MOUTH DAILY. 90 tablet 1  . lisinopril (PRINIVIL,ZESTRIL) 20 MG tablet TAKE 1/2 TABLET DAILY 30 tablet 3  . Multiple Vitamins-Minerals (MULTIVITAMIN PO) Take 1 tablet by mouth daily. MVI w/Calcium    . Omega-3 Fatty Acids (FISH OIL PO) Take 1 tablet by mouth daily.     No current facility-administered medications on file prior to visit.   No Known Allergies History   Social History  . Marital Status: Widowed    Spouse Name: N/A    Number of Children: N/A  .  Years of Education: N/A   Occupational History  . Not on file.   Social History Main Topics  . Smoking status: Current Every Day Smoker -- 0.75 packs/day for 1 years    Types: Cigarettes  . Smokeless tobacco: Never Used     Comment: Started back smoking last year.  . Alcohol Use: No  . Drug Use: No  . Sexual Activity: Yes    Birth Control/ Protection: Surgical   Other Topics Concern  . Not on file   Social History Narrative      Review of Systems  All other systems reviewed and are negative.      Objective:   Physical Exam  Cardiovascular: Normal rate, regular rhythm and normal heart sounds.   Pulmonary/Chest: Effort normal and breath sounds normal. No respiratory distress. She has no wheezes. She has no rales.  Musculoskeletal:       Left shoulder: She exhibits decreased range of motion, tenderness, pain and decreased strength. She exhibits no swelling, no effusion and no crepitus.  Vitals reviewed.         Assessment & Plan:  Rotator cuff tendonitis, left  Patient certainly has tendinitis of the rotator cuff. However I cannot rule out a possible tear. Patient would like to try the cortisone injection first. Therefore using sterile technique, I injected the left shoulder with  a mixture of 2 mL of 0.1% lidocaine, 2 mL of Marcaine, and 2 mL of 40 mg per mL Kenalog. If the patient shoulder is not improving in 1 week, I would like to proceed with an MRI to rule out a tear in her rotator cuff. If there is in fact a tear, she will require orthopedic consultation.

## 2014-06-27 ENCOUNTER — Telehealth: Payer: Self-pay | Admitting: Physician Assistant

## 2014-06-27 DIAGNOSIS — M25512 Pain in left shoulder: Secondary | ICD-10-CM

## 2014-06-27 NOTE — Telephone Encounter (Signed)
[phone] PT states her shoulder is better and she is still wanting to go to a specialist to make sure everything is okay

## 2014-06-27 NOTE — Telephone Encounter (Signed)
Start with MRI of shoulder.

## 2014-06-30 NOTE — Telephone Encounter (Signed)
LMTRC

## 2014-07-01 NOTE — Telephone Encounter (Signed)
Pt is aware and will place order for MRI

## 2014-07-05 ENCOUNTER — Telehealth: Payer: Self-pay | Admitting: *Deleted

## 2014-07-05 DIAGNOSIS — M25512 Pain in left shoulder: Secondary | ICD-10-CM

## 2014-07-05 NOTE — Telephone Encounter (Signed)
Received call from [facility] on MRI of Shoulder and pts insurance can not authorize MRI d/t needs Xray of shoulder first before doing MRI, I placed an order for Xray shoulder.   LMTRC to pt to inform pt needs xray first

## 2014-07-05 NOTE — Telephone Encounter (Signed)
Pt called back and is aware of going to get xrays

## 2014-07-06 ENCOUNTER — Ambulatory Visit (HOSPITAL_COMMUNITY)
Admission: RE | Admit: 2014-07-06 | Discharge: 2014-07-06 | Disposition: A | Discharge status: Home / Self Care | Payer: Medicare Other | Source: Ambulatory Visit | Attending: Family Medicine | Admitting: Family Medicine

## 2014-07-06 DIAGNOSIS — M25512 Pain in left shoulder: Secondary | ICD-10-CM | POA: Insufficient documentation

## 2014-07-19 ENCOUNTER — Ambulatory Visit (HOSPITAL_COMMUNITY)
Admission: RE | Admit: 2014-07-19 | Discharge: 2014-07-19 | Disposition: A | Discharge status: Home / Self Care | Payer: Medicare Other | Source: Ambulatory Visit | Attending: Family Medicine | Admitting: Family Medicine

## 2014-07-19 DIAGNOSIS — M778 Other enthesopathies, not elsewhere classified: Secondary | ICD-10-CM | POA: Diagnosis not present

## 2014-07-19 DIAGNOSIS — M19012 Primary osteoarthritis, left shoulder: Secondary | ICD-10-CM | POA: Diagnosis not present

## 2014-07-19 DIAGNOSIS — M25512 Pain in left shoulder: Secondary | ICD-10-CM | POA: Diagnosis present

## 2014-08-06 ENCOUNTER — Other Ambulatory Visit: Payer: Self-pay | Admitting: Physician Assistant

## 2014-08-09 NOTE — Telephone Encounter (Signed)
Medication refilled per protocol. 

## 2014-08-25 ENCOUNTER — Ambulatory Visit (INDEPENDENT_AMBULATORY_CARE_PROVIDER_SITE_OTHER): Payer: Medicare Other | Admitting: Cardiovascular Disease

## 2014-08-25 ENCOUNTER — Encounter: Payer: Self-pay | Admitting: Cardiovascular Disease

## 2014-08-25 VITALS — BP 130/86 | HR 53 | Ht 64.0 in | Wt 176.8 lb

## 2014-08-25 DIAGNOSIS — I10 Essential (primary) hypertension: Secondary | ICD-10-CM

## 2014-08-25 DIAGNOSIS — E785 Hyperlipidemia, unspecified: Secondary | ICD-10-CM

## 2014-08-25 DIAGNOSIS — I5022 Chronic systolic (congestive) heart failure: Secondary | ICD-10-CM

## 2014-08-25 NOTE — Progress Notes (Signed)
Cardiology Office Note   Date:  08/25/2014   ID:  Myracle, Febres 1945/09/06, MRN [id number]  PCP:  Karis Juba, PA-C  Cardiologist:   Thayer Headings, MD   Chief Complaint  Patient presents with  . Congestive Heart Failure    Problem list: 1. Chronic systolic congestive heart failure-EF of around 44% in 2010.  Her EF has now normalized.  2. Hypertension 3. Dyslipidemia   Adrienne [name] is a middle-age female with a history of hypertension, dyslipidemia, palpitations. She also has a history of chest pain. A nuclear stress test from March of 2010 was negative for ischemia.  She has not had episodes of chest pain or shortness of breath.  Jan. 5, 2015:  Adrienne [name] is seen today after a ~ 3 year absence. She retired last year and tried to get life insurance. She was told that she has "CHF" . Her echo and myoview from 2010 showed mildly reduced EF . The echo showed EF of 45-50%. Myoview EF was 41%.   Her medication doses are slightly lower than when we last saw her 3 years ago. She has gradually reduced her dose because of chronic headaches. She has been seeing a Karis Juba, NP at [place].  She still smokes - 1/2 ppd currently. She avoids salt. Does not eat out much.   December 31, 2013:  Adrienne [name] is doing ok. BP is much better than previous visit. BP at her medical doctors office . She stopped taking the HCTZ and potassium when she ran out of her prescription. Her blood pressure is better despite the fact that she's not on those medications, . She is planning on moving to [facility] within the next several months   August 25, 2014:    Adrienne [name] is a 69 y.o. female who presents for follow-up of her history of congestive heart failure, hyperlipidemia, and hypertension.  She is staying busy. No CP or dyspnea.  Takes all of her meds.     Past Medical History  Diagnosis Date  . Hypertension   . Thyroid disease   . Hyperlipidemia   . CHF  (congestive heart failure)     EF 45-50%  . Smoker     Past Surgical History  Procedure Laterality Date  . Vaginal hysterectomy      1986  . Back surgery    . Cholecystectomy    . Right knee meniscus    . Polypectomy-oropharynx       Current Outpatient Prescriptions  Medication Sig Dispense Refill  . amLODipine (NORVASC) 5 MG tablet TAKE 1 TABLET (5 MG TOTAL) BY MOUTH DAILY. 90 tablet 1  . atorvastatin (LIPITOR) 80 MG tablet TAKE 1 TABLET BY MOUTH AT BEDTIME (Patient taking differently: TAKE 1 TABLET BY MOUTH AT BEDTIME) 90 tablet 3  . carvedilol (COREG) 3.125 MG tablet TAKE 1 TABLET (3.125 MG TOTAL) BY MOUTH 2 (TWO) TIMES DAILY WITH A MEAL. 180 tablet 1  . levothyroxine (SYNTHROID, LEVOTHROID) 75 MCG tablet TAKE 1 TABLET (75 MCG TOTAL) BY MOUTH DAILY. 90 tablet 1  . lisinopril (PRINIVIL,ZESTRIL) 20 MG tablet TAKE 1/2 TABLET DAILY 30 tablet 3  . Multiple Vitamins-Minerals (MULTIVITAMIN PO) Take 1 tablet by mouth daily. MVI w/Calcium    . Omega-3 Fatty Acids (FISH OIL PO) Take 1 tablet by mouth daily.     No current facility-administered medications for this visit.    Allergies:   Review of patient's allergies indicates no known allergies.  Social History:  The patient  reports that she has been smoking Cigarettes.  She has a .75 pack-year smoking history. She has never used smokeless tobacco. She reports that she does not drink alcohol or use illicit drugs.   Family History:  The patient's family history includes Cancer in her sister; Diabetes in her brother and brother; Hypertension in her other.    ROS:  Please see the history of present illness.    Review of Systems: Constitutional:  denies fever, chills, diaphoresis, appetite change and fatigue.  HEENT: denies photophobia, eye pain, redness, hearing loss, ear pain, congestion, sore throat, rhinorrhea, sneezing, neck pain, neck stiffness and tinnitus.  Respiratory: denies SOB, DOE, cough, chest tightness, and wheezing.   Cardiovascular: denies chest pain, palpitations and leg swelling.  Gastrointestinal: denies nausea, vomiting, abdominal pain, diarrhea, constipation, blood in stool.  Genitourinary: denies dysuria, urgency, frequency, hematuria, flank pain and difficulty urinating.  Musculoskeletal: denies  myalgias, back pain, joint swelling, arthralgias and gait problem.   Skin: denies pallor, rash and wound.  Neurological: denies dizziness, seizures, syncope, weakness, light-headedness, numbness and headaches.   Hematological: denies adenopathy, easy bruising, personal or family bleeding history.  Psychiatric/ Behavioral: denies suicidal ideation, mood changes, confusion, nervousness, sleep disturbance and agitation.       All other systems are reviewed and negative.    PHYSICAL EXAM: VS:  BP 130/86 mmHg  Pulse 53  Ht 5\' 4"  (1.626 m)  Wt 176 lb 12.8 oz (80.196 kg)  BMI 30.33 kg/m2 , BMI Body mass index is 30.33 kg/(m^2). GEN: Well nourished, well developed, in no acute distress HEENT: normal Neck: no JVD, carotid bruits, or masses Cardiac: RRR; no murmurs, rubs, or gallops,no edema  Respiratory:  clear to auscultation bilaterally, normal work of breathing GI: soft, nontender, nondistended, + BS MS: no deformity or atrophy Skin: warm and dry, no rash Neuro:  Strength and sensation are intact Psych: normal   EKG:  EKG is ordered today. The ekg ordered today demonstrates sinus bradycardia at 53. , LVH with repol abn.    Recent Labs: 06/02/2014: ALT 20; BUN 14; Creatinine 0.76; Potassium 4.1; Sodium 141; TSH 3.153    Lipid Panel    Component Value Date/Time   CHOL 179 06/02/2014 0952   TRIG 102 06/02/2014 0952   HDL 47 06/02/2014 0952   CHOLHDL 3.8 06/02/2014 0952   VLDL 20 06/02/2014 0952   LDLCALC 112* 06/02/2014 0952      Wt Readings from Last 3 Encounters:  08/25/14 176 lb 12.8 oz (80.196 kg)  06/23/14 176 lb (79.833 kg)  06/02/14 180 lb (81.647 kg)      Other studies  Reviewed: Additional studies/ records that were reviewed today include: . Review of the above records demonstrates:    ASSESSMENT AND PLAN: 1. Chronic systolic congestive heart failure-EF of around 44% in 2010.  Her EF has now normalized.  - Continue same medications.  2. Hypertension - her blood pressures well-controlled. Continue current medications. I have encouraged her to exercise on a regular basis.  3. Dyslipidemia - she's currently on atorvastatin. Her lipids are managed by her primary medical doctor. I'll see her in one year for follow-up.   Current medicines are reviewed at length with the patient today.  The patient does not have concerns regarding medicines.  The following changes have been made:  no change   Disposition:   FU with me in 1 year.     Signed, Ercel Normoyle, Wonda Cheng, MD  08/25/2014 10:46  AM    [facility] [place], [place], [place]  [address] Phone: [phone]; Fax: [phone]

## 2014-08-25 NOTE — Patient Instructions (Signed)
Your physician recommends that you continue on your current medications as directed. Please refer to the Current Medication list given to you today.  Your physician wants you to follow-up in: 1 year with Dr. Nahser.  You will receive a reminder letter in the mail two months in advance. If you don't receive a letter, please call our office to schedule the follow-up appointment.  

## 2014-08-30 ENCOUNTER — Telehealth: Payer: Self-pay | Admitting: Family Medicine

## 2014-08-30 NOTE — Telephone Encounter (Signed)
Rec'd letter from [facility] about compliance with Atorvastatin.  She says the 80 mg gives her a headache (which is in OV notes)  So she told provider is taking 1/2 tablet (40 mg) daily and tolerating that with no side effects.  She said insurance also contacted her and she told them was only taking 1/2 tablet.  She said they must not have misunderstood her because they sent her another 90 day supply.  At 1/2 tablet a day she has enough for the next 6 months.  Will also let insurance know that patient is compliant.

## 2014-08-31 ENCOUNTER — Encounter: Payer: Self-pay | Admitting: Physician Assistant

## 2014-09-28 ENCOUNTER — Other Ambulatory Visit: Payer: Self-pay | Admitting: Physician Assistant

## 2014-09-29 NOTE — Telephone Encounter (Signed)
Medication refilled per protocol. 

## 2014-11-01 ENCOUNTER — Other Ambulatory Visit: Payer: Self-pay | Admitting: Physician Assistant

## 2014-11-01 DIAGNOSIS — Z1231 Encounter for screening mammogram for malignant neoplasm of breast: Secondary | ICD-10-CM

## 2014-12-07 ENCOUNTER — Ambulatory Visit: Payer: Medicare Other | Admitting: Physician Assistant

## 2014-12-09 ENCOUNTER — Ambulatory Visit (HOSPITAL_COMMUNITY)
Admission: RE | Admit: 2014-12-09 | Discharge: 2014-12-09 | Disposition: A | Discharge status: Home / Self Care | Payer: Medicare Other | Source: Ambulatory Visit | Attending: Physician Assistant | Admitting: Physician Assistant

## 2014-12-09 DIAGNOSIS — Z1231 Encounter for screening mammogram for malignant neoplasm of breast: Secondary | ICD-10-CM | POA: Diagnosis present

## 2014-12-09 LAB — HM MAMMOGRAPHY

## 2014-12-14 ENCOUNTER — Ambulatory Visit (INDEPENDENT_AMBULATORY_CARE_PROVIDER_SITE_OTHER): Payer: Medicare Other | Admitting: Physician Assistant

## 2014-12-14 ENCOUNTER — Encounter: Payer: Self-pay | Admitting: Physician Assistant

## 2014-12-14 VITALS — BP 120/80 | HR 74 | Temp 98.0°F | Resp 18 | Wt 173.0 lb

## 2014-12-14 DIAGNOSIS — Z720 Tobacco use: Secondary | ICD-10-CM

## 2014-12-14 DIAGNOSIS — E039 Hypothyroidism, unspecified: Secondary | ICD-10-CM | POA: Diagnosis not present

## 2014-12-14 DIAGNOSIS — M25512 Pain in left shoulder: Secondary | ICD-10-CM | POA: Diagnosis not present

## 2014-12-14 DIAGNOSIS — I5022 Chronic systolic (congestive) heart failure: Secondary | ICD-10-CM

## 2014-12-14 DIAGNOSIS — M25561 Pain in right knee: Secondary | ICD-10-CM

## 2014-12-14 DIAGNOSIS — Z9114 Patient's other noncompliance with medication regimen: Secondary | ICD-10-CM

## 2014-12-14 DIAGNOSIS — E785 Hyperlipidemia, unspecified: Secondary | ICD-10-CM

## 2014-12-14 DIAGNOSIS — I10 Essential (primary) hypertension: Secondary | ICD-10-CM

## 2014-12-14 DIAGNOSIS — Z91148 Patient's other noncompliance with medication regimen for other reason: Secondary | ICD-10-CM

## 2014-12-14 DIAGNOSIS — F17200 Nicotine dependence, unspecified, uncomplicated: Secondary | ICD-10-CM

## 2014-12-14 LAB — COMPLETE METABOLIC PANEL WITH GFR
ALK PHOS: 80 U/L (ref 39–117)
ALT: 12 U/L (ref 0–35)
AST: 17 U/L (ref 0–37)
Albumin: 4 g/dL (ref 3.5–5.2)
BUN: 11 mg/dL (ref 6–23)
CO2: 26 meq/L (ref 19–32)
Calcium: 9.5 mg/dL (ref 8.4–10.5)
Chloride: 105 mEq/L (ref 96–112)
Creat: 0.81 mg/dL (ref 0.50–1.10)
GFR, Est African American: 87 mL/min
GFR, Est Non African American: 75 mL/min
Glucose, Bld: 102 mg/dL — ABNORMAL HIGH (ref 70–99)
Potassium: 4.3 mEq/L (ref 3.5–5.3)
SODIUM: 139 meq/L (ref 135–145)
Total Bilirubin: 0.3 mg/dL (ref 0.2–1.2)
Total Protein: 7.1 g/dL (ref 6.0–8.3)

## 2014-12-14 LAB — LIPID PANEL
Cholesterol: 213 mg/dL — ABNORMAL HIGH (ref 0–200)
HDL: 55 mg/dL (ref >=46)
LDL CALC: 134 mg/dL — AB (ref 0–99)
TRIGLYCERIDES: 121 mg/dL (ref <150)
Total CHOL/HDL Ratio: 3.9 Ratio
VLDL: 24 mg/dL (ref 0–40)

## 2014-12-14 LAB — TSH: TSH: 1.812 u[IU]/mL (ref 0.350–4.500)

## 2014-12-14 NOTE — Progress Notes (Signed)
Patient ID: Adrienne [name] MRN: [id number], DOB: [birth year], 69 y.o. Date of Encounter: @DATE @  Chief Complaint:  Chief Complaint  Patient presents with  . Follow-up    6 mos, knee and shoulder pain    HPI: 69 y.o. year old AA female  presents for routine f/u of hypertension, hyperlipidemia, hypothyroidism.  At [place] 12/02/2013-- The nurse today made me aware of the fact that patient has not been taking medications as directed by the cardiologist and has not followed up with a cardiologist as directed. I discussed this with the patient again. She says that she was given a 90 day supply of HCTZ and potassium. Initially, she says that she was given this 90 day supply and then she had followup tests and they were normal so she felt that she no longer needed the medicines after the 90s they supply was over. Then I explained to her that she was given these medications because her blood pressure was elevated at that office visit and that regardless of the test results she needed to be taking these medicines. Then she tells me that she is "taking enough pills".  She says that she" wants to see him (the cardiologist) again before she starts it." Told her that her blood pressure is high again today and that she needs the medicine. Then she says is high today because there has been stress going on at home. Then I told her that it was also high at the cardiologist's last appointment. At the end of the conversation she refused to restart the medicines at this time and promises that she will followup with Dr. Acie Fredrickson.  At [place] 05/2014-- She says she did see Dr. Duanne Limerick she IS taking all meds that are on med list.   At [place] 12/14/2014: She says that she is taking her thyroid medicine daily. says she is taking her cholesterol medicine daily. Having no myalgias or other adverse effects.  She says that she has having problems with pain in her left shoulder and her right knee. She had office visit here  with Dr. Dennard Schaumann 06/23/14. At that time, he did a cortisone injection. Ordered follow-up MRI. MRI performed 07/19/14. Showed supraspinatus and infraspinatus tendinopathy without tear. Moderate acromioclavicular osteoarthritis. She says that she has seen Dr. Marlou Sa in the past for her knee and would like to follow-up with Dr. Marlou Sa at orthopedics.  No other complaints or concerns today.     Past Medical History  Diagnosis Date  . Hypertension   . Thyroid disease   . Hyperlipidemia   . CHF (congestive heart failure)     EF 45-50%  . Smoker      Home Meds: Outpatient Prescriptions Prior to Visit  Medication Sig Dispense Refill  . amLODipine (NORVASC) 5 MG tablet TAKE 1 TABLET (5 MG TOTAL) BY MOUTH DAILY. 90 tablet 1  . atorvastatin (LIPITOR) 80 MG tablet TAKE 1 TABLET BY MOUTH AT BEDTIME (Patient taking differently: TAKE 1 TABLET BY MOUTH AT BEDTIME) 90 tablet 3  . carvedilol (COREG) 3.125 MG tablet TAKE 1 TABLET (3.125 MG TOTAL) BY MOUTH 2 (TWO) TIMES DAILY WITH A MEAL. 180 tablet 1  . levothyroxine (SYNTHROID, LEVOTHROID) 75 MCG tablet TAKE 1 TABLET (75 MCG TOTAL) BY MOUTH DAILY. 90 tablet 1  . lisinopril (PRINIVIL,ZESTRIL) 20 MG tablet TAKE 1/2 TABLET DAILY 30 tablet 3  . Multiple Vitamins-Minerals (MULTIVITAMIN PO) Take 1 tablet by mouth daily. MVI w/Calcium    . Omega-3 Fatty Acids (FISH OIL PO)  Take 1 tablet by mouth daily.     No facility-administered medications prior to visit.    Allergies: No Known Allergies  History   Social History  . Marital Status: Widowed    Spouse Name: N/A  . Number of Children: N/A  . Years of Education: N/A   Occupational History  . Not on file.   Social History Main Topics  . Smoking status: Current Every Day Smoker -- 0.75 packs/day for 1 years    Types: Cigarettes  . Smokeless tobacco: Never Used     Comment: Started back smoking last year.  . Alcohol Use: No  . Drug Use: No  . Sexual Activity: Yes    Birth Control/ Protection:  Surgical   Other Topics Concern  . Not on file   Social History Narrative    Family History  Problem Relation Age of Onset  . Cancer Sister   . Diabetes Brother   . Hypertension Other   . Diabetes Brother      Review of Systems:  See HPI for pertinent ROS. All other ROS negative.    Physical Exam: Blood pressure 120/80, pulse 74, temperature 98 F (36.7 C), temperature source Oral, resp. rate 18, weight 173 lb (78.472 kg)., Body mass index is 29.68 kg/(m^2). General: WNWD AAF. Appears in no acute distress. Neck: Supple. No thyromegaly. No lymphadenopathy. No carotid bruit. Lungs: Clear bilaterally to auscultation without wheezes, rales, or rhonchi. Breathing is unlabored. Heart: RRR with S1 S2. No murmurs, rubs, or gallops. Abdomen: Soft, non-tender, non-distended with normoactive bowel sounds. No hepatomegaly. No rebound/guarding. No obvious abdominal masses. Musculoskeletal:  Strength and tone normal for age. Extremities/Skin: Warm and dry. No clubbing or cyanosis. No edema. No rashes or suspicious lesions. Neuro: Alert and oriented X 3. Moves all extremities spontaneously. Gait is normal. CNII-XII grossly in tact. Psych:  Responds to questions appropriately with a normal affect.     ASSESSMENT AND PLAN:  69 y.o. year old female with  1. Hypertension She is taking meds as directed. Blood Pressure at goal/controlled. Continue current medications and check lab to monitor. - COMPLETE METABOLIC PANEL WITH GFR  2. Dyslipidemia - COMPLETE METABOLIC PANEL WITH GFR - Lipid panel  3. Hypothyroidism - TSH  4. Smoker  5. MEDICATION NONCOMPLIANCE -- SEE HPI REGARDING BP MEDS IN PAST. SEE #4 BELOW REGARDING CHOLESTEROL MEDS IN PAST !!!  I COPIED BELOW INFORMATION FROM MY OV NOTE DATED 05/27/2013: hyperlipidemia: In the past she had extremely high cholesterol with LDL 201. However she was repeatedly noncompliant with medications. At her last visit with me June 2014 I asked her  why she was repeatedly noncompliant with these medicines. She said "I don't want to take another pill. If I start a pill I will have to take it the rest of my Life." at that time I discussed the risk of MI stroke death versus taking a pill. She was agreeable to take the medication.  SHe had repeat lipid panel done at that visit in June 2014. LDL was 208. I prescribed Lipitor 80 mg. She says that when she took that it caused a significant headache. She cut the pill in half.  She is taking Lipitor 40 mg daily with no adverse effects. Dyslipidemia  See history of present illness. Lipitor 80 mg caused headache. Patiently currently taking Lipitor 40 mg and tolerates this well  - COMPLETE METABOLIC PANEL WITH GFR  - Lipid panel   6. Congestive heart failure  At her last visit  with me 11/25/2012 I did not have any cardiac history documented in her paper chart but then I saw this diagnosis in Epic. I Then read notes in Epic and discussed with patient.  She saw Dr. Acie Fredrickson in past secondary to palpitations. Also had chest pain and negative nuclear stress test March 2010. She knows nothing about the CHF and I do not see any actual test reports reg CHF but do see this in Dr. Elmarie Shiley office visit note.  She has no CHF symptoms or findings on exam. She is on Coreg and ACE inhibitor.  At 05/2013 office visit I told her to schedule followup with Dr. Acie Fredrickson.  The last office visit note in Epic with him was 10/04/10.  At 11/2013 OV she said that she forgot all about initial and followup with Dr. Acie Fredrickson.  At 11/2013 I scheduled referral. She was agreeable to followup.  At [place] 05/2014 she says she did f/u with Dr. Acie Fredrickson and she is taking meds as directed.   #7 She sees gynecology for her breast exam, mammogram, pelvic exam Pap smear.   8. Colonoscopy: She states that she had one at age 70.  Says she had a second one at age 34. This one was also normal and was to repeat 10 years.  Says it was with [facility]  #8  immunizations:  Tetanus: Given here 05/2013 Pneumonia vaccine: Prevnar 13 --given here 05/2013.  Pneumovax 23. --given here 05/2014 Zostavax: She says that she had this in the past with Dr. Joaquim Lai.  Influenza vaccine: N/A--June    She is to followup here in 6 months or sooner if needed.   [address] [place], [place], [facility] 12/14/2014 10:46 AM

## 2014-12-15 ENCOUNTER — Encounter: Payer: Self-pay | Admitting: Family Medicine

## 2015-01-01 ENCOUNTER — Other Ambulatory Visit: Payer: Self-pay | Admitting: Cardiovascular Disease

## 2015-01-03 ENCOUNTER — Other Ambulatory Visit: Payer: Self-pay

## 2015-01-03 MED ORDER — LISINOPRIL 20 MG PO TABS: 10.0000 mg | ORAL_TABLET | Freq: Every day | ORAL | Status: DC

## 2015-01-04 ENCOUNTER — Encounter: Payer: Self-pay | Admitting: Family Medicine

## 2015-01-04 DIAGNOSIS — M7552 Bursitis of left shoulder: Secondary | ICD-10-CM | POA: Insufficient documentation

## 2015-01-04 DIAGNOSIS — M1711 Unilateral primary osteoarthritis, right knee: Secondary | ICD-10-CM | POA: Insufficient documentation

## 2015-01-12 ENCOUNTER — Ambulatory Visit (INDEPENDENT_AMBULATORY_CARE_PROVIDER_SITE_OTHER): Payer: Medicare Other | Admitting: Obstetrics and Gynecology

## 2015-01-12 ENCOUNTER — Encounter: Payer: Self-pay | Admitting: Obstetrics and Gynecology

## 2015-01-12 VITALS — BP 118/68 | Ht 64.0 in | Wt 172.0 lb

## 2015-01-12 DIAGNOSIS — F17200 Nicotine dependence, unspecified, uncomplicated: Secondary | ICD-10-CM | POA: Insufficient documentation

## 2015-01-12 DIAGNOSIS — F17210 Nicotine dependence, cigarettes, uncomplicated: Secondary | ICD-10-CM | POA: Diagnosis not present

## 2015-01-12 DIAGNOSIS — N816 Rectocele: Secondary | ICD-10-CM | POA: Diagnosis not present

## 2015-01-12 DIAGNOSIS — Z90710 Acquired absence of both cervix and uterus: Secondary | ICD-10-CM | POA: Diagnosis not present

## 2015-01-12 NOTE — Progress Notes (Addendum)
Patient ID: Adrienne [name], female   DOB: 1946/04/02, 69 y.o.   MRN: [id number]  Assessment:  Annual Gyn Exam S/p hyst.  Small asymptomatic rectocele 1. Discussed water aerobics class to minimize chronic, arthritic knee pain  2. Pt encouraged to discontinue tobacco use Plan:  1. pap smear not done, next pap due not required as s/p hyst 2. return annually or prn 3.  Annual mammogram advised 4.   Water aerobics to minimize arthritic, bilateral knee pain    Subjective:  Adrienne SANTOYO is a 69 y.o. female [phone] who presents for annual exam. No LMP recorded. Patient has had a hysterectomy. The patient present for her annual exam with no present complaints. Pt also requests counseling on an alternate to knee surgery. Pt reports due to arthritis in her bilateral knees she has been advised to undergo knee surgery. Pt wishes to avoid surgery. Pt reports a hysterectomy due to pre-cancerous cells. Pt reports she completed her mammogram recently which was normal. Pt reports daily tobacco use, noting she quit for 18 months, however, restarted after retiring.   The following portions of the patient's history were reviewed and updated as appropriate: allergies, current medications, past family history, past medical history, past social history, past surgical history and problem list. Past Medical History  Diagnosis Date  . Hypertension   . Thyroid disease   . Hyperlipidemia   . CHF (congestive heart failure)     EF 45-50%  . Smoker     Past Surgical History  Procedure Laterality Date  . Vaginal hysterectomy      1986  . Back surgery    . Cholecystectomy    . Right knee meniscus    . Polypectomy-oropharynx    . Abdominal hysterectomy       Current outpatient prescriptions:  .  amLODipine (NORVASC) 5 MG tablet, TAKE 1 TABLET (5 MG TOTAL) BY MOUTH DAILY., Disp: 90 tablet, Rfl: 1 .  atorvastatin (LIPITOR) 80 MG tablet, TAKE 1 TABLET BY MOUTH AT BEDTIME (Patient taking differently: TAKE 1  TABLET BY MOUTH AT BEDTIME), Disp: 90 tablet, Rfl: 3 .  carvedilol (COREG) 3.125 MG tablet, TAKE 1 TABLET (3.125 MG TOTAL) BY MOUTH 2 (TWO) TIMES DAILY WITH A MEAL., Disp: 180 tablet, Rfl: 1 .  levothyroxine (SYNTHROID, LEVOTHROID) 75 MCG tablet, TAKE 1 TABLET (75 MCG TOTAL) BY MOUTH DAILY., Disp: 90 tablet, Rfl: 1 .  lisinopril (PRINIVIL,ZESTRIL) 20 MG tablet, Take 0.5 tablets (10 mg total) by mouth daily., Disp: 30 tablet, Rfl: 2 .  Omega-3 Fatty Acids (FISH OIL PO), Take 1 tablet by mouth daily., Disp: , Rfl:   Review of Systems Constitutional: negative Gastrointestinal: negative Genitourinary: negative  Objective:  BP 118/68 mmHg  Ht 5\' 4"  (1.626 m)  Wt 172 lb (78.019 kg)  BMI 29.51 kg/m2   BMI: Body mass index is 29.51 kg/(m^2).  General Appearance: Alert, appropriate appearance for age. No acute distress HEENT: Grossly normal Neck / Thyroid:  Cardiovascular: RRR; normal S1, S2, no murmur Lungs: CTA bilaterally Back: No CVAT Breast Exam: No dimpling, nipple retraction or discharge. No masses or nodes., Normal to inspection, Normal breast tissue bilaterally and No masses or nodes.No dimpling, nipple retraction or discharge. Gastrointestinal: Soft, non-tender, no masses or organomegaly Pelvic Exam:  Vulva and vagina appear normal. Bimanual exam reveals normal uterus and adnexa. External genitalia: normal general appearance Vaginal: normal mucosa without prolapse or lesions, normal without tenderness, induration or masses and normal rugae, good support Cervix: surgically absent  Adnexa: normal bimanual exam Uterus: surgically absent  Rectal: guaiac negative, good rectal tone, small rectocele  Rectovaginal: normal rectal, no masses Lymphatic Exam: Non-palpable nodes in neck, clavicular, axillary, or inguinal regions Skin: no rash or abnormalities Neurologic: Normal gait and speech, no tremor  Psychiatric: Alert and oriented, appropriate affect. Urinalysis: Not done  Mallory Shirk. MD Pgr [phone] 9:36 AM  This chart was scribed for Jonnie Kind, MD by Stephania Fragmin, ED Scribe. This patient was seen in room Room 3 and the patient's care was started at 9:36 AM. I personally performed the services described in this documentation, which was SCRIBED in my presence. The recorded information has been reviewed and considered accurate. It has been edited as necessary during review. Jonnie Kind, MD

## 2015-01-12 NOTE — Progress Notes (Signed)
Patient ID: Adrienne [name], female   DOB: September 17, 1945, 69 y.o.   MRN: [id number] Pt here today for annual exam. Pt has had a hysterectomy due to pre-cancerous cells. Pt states that she would like Dr.Ferguson's advise on "alternative surgery" for her knee, she wants his advice and wants to get some pointers from him.

## 2015-02-08 ENCOUNTER — Other Ambulatory Visit: Payer: Self-pay | Admitting: Physician Assistant

## 2015-02-28 ENCOUNTER — Encounter: Payer: Self-pay | Admitting: Gastroenterology

## 2015-03-31 ENCOUNTER — Other Ambulatory Visit: Payer: Self-pay | Admitting: Physician Assistant

## 2015-03-31 NOTE — Telephone Encounter (Signed)
Refill appropriate and filled per protocol. 

## 2015-05-25 ENCOUNTER — Other Ambulatory Visit: Payer: Self-pay | Admitting: Physician Assistant

## 2015-05-25 NOTE — Telephone Encounter (Signed)
Refill appropriate and filled per protocol. 

## 2015-06-21 ENCOUNTER — Ambulatory Visit (INDEPENDENT_AMBULATORY_CARE_PROVIDER_SITE_OTHER): Payer: Medicare Other | Admitting: Physician Assistant

## 2015-06-21 ENCOUNTER — Encounter: Payer: Self-pay | Admitting: Physician Assistant

## 2015-06-21 VITALS — BP 150/86 | HR 60 | Temp 98.3°F | Resp 18 | Ht 64.0 in | Wt 170.0 lb

## 2015-06-21 DIAGNOSIS — M179 Osteoarthritis of knee, unspecified: Secondary | ICD-10-CM

## 2015-06-21 DIAGNOSIS — M1711 Unilateral primary osteoarthritis, right knee: Secondary | ICD-10-CM

## 2015-06-21 DIAGNOSIS — E785 Hyperlipidemia, unspecified: Secondary | ICD-10-CM

## 2015-06-21 DIAGNOSIS — I5022 Chronic systolic (congestive) heart failure: Secondary | ICD-10-CM | POA: Diagnosis not present

## 2015-06-21 DIAGNOSIS — E039 Hypothyroidism, unspecified: Secondary | ICD-10-CM | POA: Diagnosis not present

## 2015-06-21 DIAGNOSIS — I10 Essential (primary) hypertension: Secondary | ICD-10-CM | POA: Diagnosis not present

## 2015-06-21 DIAGNOSIS — Z720 Tobacco use: Secondary | ICD-10-CM

## 2015-06-21 DIAGNOSIS — F17200 Nicotine dependence, unspecified, uncomplicated: Secondary | ICD-10-CM

## 2015-06-21 LAB — COMPLETE METABOLIC PANEL WITH GFR
ALBUMIN: 3.8 g/dL (ref 3.6–5.1)
ALK PHOS: 69 U/L (ref 33–130)
ALT: 16 U/L (ref 6–29)
AST: 24 U/L (ref 10–35)
BUN: 13 mg/dL (ref 7–25)
CALCIUM: 9 mg/dL (ref 8.6–10.4)
CO2: 24 mmol/L (ref 20–31)
CREATININE: 0.72 mg/dL (ref 0.50–0.99)
Chloride: 105 mmol/L (ref 98–110)
GFR, Est African American: >89 mL/min (ref >=60)
GFR, Est Non African American: 87 mL/min (ref >=60)
Glucose, Bld: 86 mg/dL (ref 70–99)
Potassium: 4.3 mmol/L (ref 3.5–5.3)
Sodium: 141 mmol/L (ref 135–146)
Total Bilirubin: 0.3 mg/dL (ref 0.2–1.2)
Total Protein: 7.1 g/dL (ref 6.1–8.1)

## 2015-06-21 LAB — TSH: TSH: 2.073 u[IU]/mL (ref 0.350–4.500)

## 2015-06-21 LAB — LIPID PANEL
Cholesterol: 252 mg/dL — ABNORMAL HIGH (ref 125–200)
HDL: 54 mg/dL (ref >=46)
LDL Cholesterol: 170 mg/dL — ABNORMAL HIGH (ref <130)
Total CHOL/HDL Ratio: 4.7 Ratio (ref <=5.0)
Triglycerides: 138 mg/dL (ref <150)
VLDL: 28 mg/dL (ref <30)

## 2015-06-21 MED ORDER — CELECOXIB 200 MG PO CAPS: 200.0000 mg | ORAL_CAPSULE | Freq: Every day | ORAL | Status: DC

## 2015-06-21 NOTE — Progress Notes (Signed)
Patient ID: HEATHR SCHWERIN MRN: [id number], DOB: [birth year], 69 y.o. Date of Encounter: @DATE @  Chief Complaint:  Chief Complaint  Patient presents with  . 6 mth check up    is fasting    HPI: 69 y.o. year old AA female  presents for routine f/u of hypertension, hyperlipidemia, hypothyroidism.  At [place] 12/02/2013-- The nurse today made me aware of the fact that patient has not been taking medications as directed by the cardiologist and has not followed up with a cardiologist as directed. I discussed this with the patient again. She says that she was given a 90 day supply of HCTZ and potassium. Initially, she says that she was given this 90 day supply and then she had followup tests and they were normal so she felt that she no longer needed the medicines after the 90s they supply was over. Then I explained to her that she was given these medications because her blood pressure was elevated at that office visit and that regardless of the test results she needed to be taking these medicines. Then she tells me that she is "taking enough pills".  She says that she" wants to see him (the cardiologist) again before she starts it." Told her that her blood pressure is high again today and that she needs the medicine. Then she says is high today because there has been stress going on at home. Then I told her that it was also high at the cardiologist's last appointment. At the end of the conversation she refused to restart the medicines at this time and promises that she will followup with Dr. Acie Fredrickson.  At [place] 05/2014-- She says she did see Dr. Duanne Limerick she IS taking all meds that are on med list.   At [place] 12/14/2014: She says that she is taking her thyroid medicine daily. says she is taking her cholesterol medicine daily. Having no myalgias or other adverse effects.  She says that she has having problems with pain in her left shoulder and her right knee. She had office visit here with Dr.  Dennard Schaumann 06/23/14. At that time, he did a cortisone injection. Ordered follow-up MRI. MRI performed 07/19/14. Showed supraspinatus and infraspinatus tendinopathy without tear. Moderate acromioclavicular osteoarthritis. She says that she has seen Dr. Marlou Sa in the past for her knee and would like to follow-up with Dr. Marlou Sa at orthopedics.  At [facility] 06/21/15: Says that she did see Dr. Marlou Sa and he told her that she had arthritis in her right knee and that down the road, in years, that she would need knee replacement. Says that she is not interested in having surgery. Says that she is considering going to [facility]--- says that she keeps seeing advertisements for this and is wondering if they may have a treatment that would help. In the meantime I told her I would prescribe her some Celebrex to take on a daily basis and see if this helps.  She states that she is taking all of her blood pressure medicines as directed. Taking her thyroid medicine. Taking her Lipitor even though she does cut this in half for 40 mg dose rather than 80 mg.  No other complaints or concerns today.     Past Medical History  Diagnosis Date  . Hypertension   . Thyroid disease   . Hyperlipidemia   . CHF (congestive heart failure) (HCC)     EF 45-50%  . Smoker      Home Meds: Outpatient Prescriptions Prior to Visit  Medication Sig Dispense Refill  . amLODipine (NORVASC) 5 MG tablet TAKE 1 TABLET (5 MG TOTAL) BY MOUTH DAILY. 90 tablet 1  . atorvastatin (LIPITOR) 80 MG tablet TAKE 1 TABLET BY MOUTH AT BEDTIME (Patient taking differently: TAKE 1 TABLET BY MOUTH AT BEDTIME) 90 tablet 3  . carvedilol (COREG) 3.125 MG tablet TAKE 1 TABLET BY MOUTH TWICE A DAY WITH A MEAL 180 tablet 1  . levothyroxine (SYNTHROID, LEVOTHROID) 75 MCG tablet TAKE 1 TABLET (75 MCG TOTAL) BY MOUTH DAILY. 90 tablet 1  . lisinopril (PRINIVIL,ZESTRIL) 20 MG tablet Take 0.5 tablets (10 mg total) by mouth daily. 30 tablet 2  . Omega-3 Fatty Acids (FISH  OIL PO) Take 1 tablet by mouth daily.     No facility-administered medications prior to visit.    Allergies: No Known Allergies  Social History   Social History  . Marital Status: Widowed    Spouse Name: N/A  . Number of Children: N/A  . Years of Education: N/A   Occupational History  . Not on file.   Social History Main Topics  . Smoking status: Current Every Day Smoker -- 0.75 packs/day for 0 years    Types: Cigarettes  . Smokeless tobacco: Never Used     Comment: Smoker since 1975 has quit and started back several times!!  . Alcohol Use: No  . Drug Use: No  . Sexual Activity: Not Currently    Birth Control/ Protection: Surgical   Other Topics Concern  . Not on file   Social History Narrative    Family History  Problem Relation Age of Onset  . Cancer Sister   . Diabetes Brother   . Hypertension Other   . Diabetes Brother      Review of Systems:  See HPI for pertinent ROS. All other ROS negative.    Physical Exam: Blood pressure 150/86, pulse 60, temperature 98.3 F (36.8 C), temperature source Oral, resp. rate 18, height 5\' 4"  (1.626 m), weight 170 lb (77.111 kg)., Body mass index is 29.17 kg/(m^2). General: WNWD AAF. Appears in no acute distress. Neck: Supple. No thyromegaly. No lymphadenopathy. No carotid bruit. Lungs: Clear bilaterally to auscultation without wheezes, rales, or rhonchi. Breathing is unlabored. Heart: RRR with S1 S2. No murmurs, rubs, or gallops. Abdomen: Soft, non-tender, non-distended with normoactive bowel sounds. No hepatomegaly. No rebound/guarding. No obvious abdominal masses. Musculoskeletal:  Strength and tone normal for age. Extremities/Skin: Warm and dry. No clubbing or cyanosis. No edema. No rashes or suspicious lesions. Neuro: Alert and oriented X 3. Moves all extremities spontaneously. Gait is normal. CNII-XII grossly in tact. Psych:  Responds to questions appropriately with a normal affect.     ASSESSMENT AND PLAN:  69  y.o. year old female with  1. Hypertension Nurse got blood pressure elevated at 150/86. I rechecked blood pressure myself on the right arm and get 160/88. I reviewed that her last visit here 12/14/14 blood pressure was 120/80 Review that at her GYN visit 01/12/15 blood pressure was 118/68. Her blood pressures were so well controlled I will not adjust medicines today. However I did ask her to return in 2-3 weeks for [place] to recheck her blood pressure. If her blood pressure is running as high as were getting today I do not want her to go the full 6 months with it running high. He is agreeable to return in 2-3 weeks. She says she is taking meds as directed. - COMPLETE METABOLIC PANEL WITH GFR  2. Dyslipidemia He takes  one half of the Lipitor 80 mg daily to equal 40mg .  - COMPLETE METABOLIC PANEL WITH GFR - Lipid panel  3. Hypothyroidism - TSH  4. Smoker  5. MEDICATION NONCOMPLIANCE -- SEE HPI REGARDING BP MEDS IN PAST. SEE #4 BELOW REGARDING CHOLESTEROL MEDS IN PAST !!!  I COPIED BELOW INFORMATION FROM MY OV NOTE DATED 05/27/2013: hyperlipidemia: In the past she had extremely high cholesterol with LDL 201. However she was repeatedly noncompliant with medications. At her last visit with me June 2014 I asked her why she was repeatedly noncompliant with these medicines. She said "I don't want to take another pill. If I start a pill I will have to take it the rest of my Life." at that time I discussed the risk of MI stroke death versus taking a pill. She was agreeable to take the medication.  SHe had repeat lipid panel done at that visit in June 2014. LDL was 208. I prescribed Lipitor 80 mg. She says that when she took that it caused a significant headache. She cut the pill in half.  She is taking Lipitor 40 mg daily with no adverse effects. Dyslipidemia  See history of present illness. Lipitor 80 mg caused headache. Patiently currently taking Lipitor 40 mg and tolerates this well  - COMPLETE  METABOLIC PANEL WITH GFR  - Lipid panel   6. Congestive heart failure  At her last visit with me 11/25/2012 I did not have any cardiac history documented in her paper chart but then I saw this diagnosis in Epic. I Then read notes in Epic and discussed with patient.  She saw Dr. Acie Fredrickson in past secondary to palpitations. Also had chest pain and negative nuclear stress test March 2010. She knows nothing about the CHF and I do not see any actual test reports reg CHF but do see this in Dr. Elmarie Shiley office visit note.  She has no CHF symptoms or findings on exam. She is on Coreg and ACE inhibitor.  At 05/2013 office visit I told her to schedule followup with Dr. Acie Fredrickson.  The last office visit note in Epic with him was 10/04/10.  At 11/2013 OV she said that she forgot all about initial and followup with Dr. Acie Fredrickson.  At 11/2013 I scheduled referral. She was agreeable to followup.  At [place] 05/2014 she says she did f/u with Dr. Acie Fredrickson and she is taking meds as directed.  At visit 05/2015 I reviewed her last visit with Dr. Acie Fredrickson was 08/25/14. When I printed her AVS at today's visit I do not see that she has any appointment scheduled with him/cardiology. Told her to make sure that she does follow-up with him.  #7 She sees gynecology for her breast exam, mammogram, pelvic exam Pap smear.   8. Colonoscopy: She states that she had one at age 34.  Says she had a second one at age 88. This one was also normal and was to repeat 10 years.  Says it was with [facility]  #8 immunizations:  Tetanus: Given here 05/2013 Pneumonia vaccine: Prevnar 13 --given here 05/2013.  Pneumovax 23. --given here 05/2014 Zostavax: She says that she had this in the past with Dr. Joaquim Lai.  Influenza vaccine: Discussed at visit 05/2015 but she refuses/defers. Aware of risk versus benefit.  Osteoarthritis Right Knee Is been evaluated by Dr. Marlou Sa orthopedics. She is interested in going to [facility] to see if they have any other  treatment options. At visit 06/21/15 I prescribed Celebrex 200 mg 1 by  mouth daily. Reviewed that her renal function has been normal.    She is to followup here in 2-3 weeks to recheck BP.   Marin Olp [place], [place], [facility] 06/21/2015 9:23 AM

## 2015-06-23 ENCOUNTER — Telehealth: Payer: Self-pay | Admitting: Family Medicine

## 2015-06-23 NOTE — Telephone Encounter (Signed)
PA submitted thru [employer].  Has been approved x 1 year.  Pharmacy made aware.

## 2015-06-28 ENCOUNTER — Inpatient Hospital Stay (HOSPITAL_COMMUNITY)
Admission: EM | Admit: 2015-06-28 | Discharge: 2015-06-30 | DRG: 389 | Disposition: A | Discharge status: Home / Self Care | Payer: PPO | Attending: Internal Medicine | Admitting: Internal Medicine

## 2015-06-28 ENCOUNTER — Emergency Department (HOSPITAL_COMMUNITY): Payer: PPO

## 2015-06-28 ENCOUNTER — Encounter (HOSPITAL_COMMUNITY): Payer: Self-pay | Admitting: Emergency Medicine

## 2015-06-28 DIAGNOSIS — Z809 Family history of malignant neoplasm, unspecified: Secondary | ICD-10-CM | POA: Diagnosis not present

## 2015-06-28 DIAGNOSIS — I10 Essential (primary) hypertension: Secondary | ICD-10-CM | POA: Diagnosis not present

## 2015-06-28 DIAGNOSIS — F17210 Nicotine dependence, cigarettes, uncomplicated: Secondary | ICD-10-CM | POA: Diagnosis present

## 2015-06-28 DIAGNOSIS — I5022 Chronic systolic (congestive) heart failure: Secondary | ICD-10-CM | POA: Diagnosis present

## 2015-06-28 DIAGNOSIS — Z833 Family history of diabetes mellitus: Secondary | ICD-10-CM

## 2015-06-28 DIAGNOSIS — K5660 Unspecified intestinal obstruction: Secondary | ICD-10-CM | POA: Diagnosis not present

## 2015-06-28 DIAGNOSIS — K56609 Unspecified intestinal obstruction, unspecified as to partial versus complete obstruction: Secondary | ICD-10-CM | POA: Diagnosis present

## 2015-06-28 DIAGNOSIS — Z8249 Family history of ischemic heart disease and other diseases of the circulatory system: Secondary | ICD-10-CM | POA: Diagnosis not present

## 2015-06-28 DIAGNOSIS — E785 Hyperlipidemia, unspecified: Secondary | ICD-10-CM | POA: Diagnosis present

## 2015-06-28 DIAGNOSIS — E039 Hypothyroidism, unspecified: Secondary | ICD-10-CM | POA: Diagnosis present

## 2015-06-28 DIAGNOSIS — R001 Bradycardia, unspecified: Secondary | ICD-10-CM | POA: Diagnosis not present

## 2015-06-28 DIAGNOSIS — F17200 Nicotine dependence, unspecified, uncomplicated: Secondary | ICD-10-CM | POA: Diagnosis present

## 2015-06-28 DIAGNOSIS — E876 Hypokalemia: Secondary | ICD-10-CM | POA: Diagnosis present

## 2015-06-28 DIAGNOSIS — Z720 Tobacco use: Secondary | ICD-10-CM

## 2015-06-28 DIAGNOSIS — K562 Volvulus: Secondary | ICD-10-CM | POA: Diagnosis not present

## 2015-06-28 DIAGNOSIS — Z9049 Acquired absence of other specified parts of digestive tract: Secondary | ICD-10-CM | POA: Diagnosis not present

## 2015-06-28 DIAGNOSIS — K5669 Other intestinal obstruction: Secondary | ICD-10-CM

## 2015-06-28 DIAGNOSIS — R109 Unspecified abdominal pain: Secondary | ICD-10-CM | POA: Diagnosis not present

## 2015-06-28 DIAGNOSIS — I110 Hypertensive heart disease with heart failure: Secondary | ICD-10-CM | POA: Diagnosis present

## 2015-06-28 LAB — URINALYSIS, ROUTINE W REFLEX MICROSCOPIC
BILIRUBIN URINE: NEGATIVE
Glucose, UA: NEGATIVE mg/dL
KETONES UR: NEGATIVE mg/dL
Leukocytes, UA: NEGATIVE
Nitrite: NEGATIVE
PROTEIN: NEGATIVE mg/dL
Specific Gravity, Urine: 1.02 (ref 1.005–1.030)
pH: 6 (ref 5.0–8.0)

## 2015-06-28 LAB — CBC WITH DIFFERENTIAL/PLATELET
Basophils Absolute: 0 10*3/uL (ref 0.0–0.1)
Basophils Relative: 0 %
EOS ABS: 0.1 10*3/uL (ref 0.0–0.7)
EOS PCT: 0 %
HCT: 42.1 % (ref 36.0–46.0)
Hemoglobin: 14 g/dL (ref 12.0–15.0)
LYMPHS ABS: 2.2 10*3/uL (ref 0.7–4.0)
Lymphocytes Relative: 16 %
MCH: 31 pg (ref 26.0–34.0)
MCHC: 33.3 g/dL (ref 30.0–36.0)
MCV: 93.1 fL (ref 78.0–100.0)
MONO ABS: 0.5 10*3/uL (ref 0.1–1.0)
Monocytes Relative: 3 %
Neutro Abs: 11 10*3/uL — ABNORMAL HIGH (ref 1.7–7.7)
Neutrophils Relative %: 81 %
PLATELETS: 268 10*3/uL (ref 150–400)
RBC: 4.52 MIL/uL (ref 3.87–5.11)
RDW: 14.2 % (ref 11.5–15.5)
WBC: 13.8 10*3/uL — AB (ref 4.0–10.5)

## 2015-06-28 LAB — COMPREHENSIVE METABOLIC PANEL
ALT: 24 U/L (ref 14–54)
AST: 26 U/L (ref 15–41)
Albumin: 3.8 g/dL (ref 3.5–5.0)
Alkaline Phosphatase: 75 U/L (ref 38–126)
Anion gap: 9 (ref 5–15)
BUN: 15 mg/dL (ref 6–20)
CALCIUM: 9.6 mg/dL (ref 8.9–10.3)
CO2: 25 mmol/L (ref 22–32)
Chloride: 108 mmol/L (ref 101–111)
Creatinine, Ser: 0.77 mg/dL (ref 0.44–1.00)
GFR calc non Af Amer: >60 mL/min (ref >60)
Glucose, Bld: 133 mg/dL — ABNORMAL HIGH (ref 65–99)
Potassium: 4 mmol/L (ref 3.5–5.1)
Sodium: 142 mmol/L (ref 135–145)
Total Bilirubin: 0.4 mg/dL (ref 0.3–1.2)
Total Protein: 7.7 g/dL (ref 6.5–8.1)

## 2015-06-28 LAB — URINE MICROSCOPIC-ADD ON
Bacteria, UA: NONE SEEN
WBC UA: NONE SEEN WBC/hpf (ref 0–5)

## 2015-06-28 LAB — LIPASE, BLOOD: Lipase: 33 U/L (ref 11–51)

## 2015-06-28 LAB — TSH: TSH: 2.534 u[IU]/mL (ref 0.350–4.500)

## 2015-06-28 LAB — PROTIME-INR
INR: 0.98 (ref 0.00–1.49)
Prothrombin Time: 13.2 seconds (ref 11.6–15.2)

## 2015-06-28 MED ORDER — ONDANSETRON HCL 4 MG PO TABS: 4.0000 mg | ORAL_TABLET | Freq: Four times a day (QID) | ORAL | Status: DC | PRN

## 2015-06-28 MED ORDER — AMLODIPINE BESYLATE 5 MG PO TABS
5.0000 mg | ORAL_TABLET | Freq: Every day | ORAL | Status: DC
  Administered 2015-06-28 – 2015-06-30 (×3): 5 mg via ORAL
  Filled 2015-06-28 (×3): qty 1

## 2015-06-28 MED ORDER — HYDROCODONE-ACETAMINOPHEN 5-325 MG PO TABS: 1.0000 | ORAL_TABLET | ORAL | Status: DC | PRN

## 2015-06-28 MED ORDER — ONDANSETRON HCL 4 MG/2ML IJ SOLN: 4.0000 mg | Freq: Four times a day (QID) | INTRAMUSCULAR | Status: DC | PRN

## 2015-06-28 MED ORDER — ACETAMINOPHEN 650 MG RE SUPP: 650.0000 mg | Freq: Four times a day (QID) | RECTAL | Status: DC | PRN

## 2015-06-28 MED ORDER — CARVEDILOL 3.125 MG PO TABS
3.1250 mg | ORAL_TABLET | Freq: Two times a day (BID) | ORAL | Status: DC
  Filled 2015-06-28: qty 1

## 2015-06-28 MED ORDER — SODIUM CHLORIDE 0.9 % IV SOLN
INTRAVENOUS | Status: DC
  Administered 2015-06-28 – 2015-06-29 (×2): via INTRAVENOUS

## 2015-06-28 MED ORDER — LEVOTHYROXINE SODIUM 75 MCG PO TABS
75.0000 ug | ORAL_TABLET | Freq: Every day | ORAL | Status: DC
  Administered 2015-06-29 – 2015-06-30 (×2): 75 ug via ORAL
  Filled 2015-06-28 (×2): qty 1

## 2015-06-28 MED ORDER — SODIUM CHLORIDE 0.9 % IV SOLN: INTRAVENOUS | Status: DC

## 2015-06-28 MED ORDER — HEPARIN SODIUM (PORCINE) 5000 UNIT/ML IJ SOLN
5000.0000 [IU] | Freq: Three times a day (TID) | INTRAMUSCULAR | Status: DC
  Administered 2015-06-28 – 2015-06-30 (×6): 5000 [IU] via SUBCUTANEOUS
  Filled 2015-06-28 (×6): qty 1

## 2015-06-28 MED ORDER — ACETAMINOPHEN 325 MG PO TABS: 650.0000 mg | ORAL_TABLET | Freq: Four times a day (QID) | ORAL | Status: DC | PRN

## 2015-06-28 MED ORDER — MORPHINE SULFATE (PF) 4 MG/ML IV SOLN
4.0000 mg | Freq: Once | INTRAVENOUS | Status: AC
  Administered 2015-06-28: 4 mg via INTRAVENOUS
  Filled 2015-06-28: qty 1

## 2015-06-28 MED ORDER — MORPHINE SULFATE (PF) 2 MG/ML IV SOLN: 1.0000 mg | INTRAVENOUS | Status: DC | PRN

## 2015-06-28 MED ORDER — IOHEXOL 300 MG/ML  SOLN
100.0000 mL | Freq: Once | INTRAMUSCULAR | Status: AC | PRN
  Administered 2015-06-28: 100 mL via INTRAVENOUS

## 2015-06-28 MED ORDER — ONDANSETRON HCL 4 MG/2ML IJ SOLN
4.0000 mg | Freq: Once | INTRAMUSCULAR | Status: AC
  Administered 2015-06-28: 4 mg via INTRAVENOUS
  Filled 2015-06-28: qty 2

## 2015-06-28 MED ORDER — SODIUM CHLORIDE 0.9 % IV BOLUS (SEPSIS)
1000.0000 mL | Freq: Once | INTRAVENOUS | Status: AC
  Administered 2015-06-28: 1000 mL via INTRAVENOUS

## 2015-06-28 NOTE — Consult Note (Signed)
Reason for Consult: Small bowel obstruction Referring Physician: ER/hospitalist  Adrienne [name] is an 70 y.o. female.  HPI: Patient is a 70 year old black female who is started throwing up earlier this morning. She states her last bowel movement was at 4 AM and she started throwing up at 5 AM. She has never had an episode like this before. She currently feels fine and has no abdominal pain. She has had an open cholecystectomy and hysterectomy in the remote past.  Past Medical History  Diagnosis Date  . Hypertension   . Thyroid disease   . Hyperlipidemia   . CHF (congestive heart failure) (HCC)     EF 45-50%  . Smoker     Past Surgical History  Procedure Laterality Date  . Vaginal hysterectomy      1986  . Back surgery    . Cholecystectomy    . Right knee meniscus    . Polypectomy-oropharynx    . Abdominal hysterectomy      Family History  Problem Relation Age of Onset  . Cancer Sister   . Diabetes Brother   . Hypertension Other   . Diabetes Brother     Social History:  reports that she has been smoking Cigarettes.  She has been smoking about 0.75 packs per day for the past 0 years. She has never used smokeless tobacco. She reports that she does not drink alcohol or use illicit drugs.  Allergies: No Known Allergies  Medications: I have reviewed the patient's current medications.  Results for orders placed or performed during the hospital encounter of 06/28/15 (from the past 48 hour(s))  Comprehensive metabolic panel     Status: Abnormal   Collection Time: 06/28/15  7:52 AM  Result Value Ref Range   Sodium 142 135 - 145 mmol/L   Potassium 4.0 3.5 - 5.1 mmol/L   Chloride 108 101 - 111 mmol/L   CO2 25 22 - 32 mmol/L   Glucose, Bld 133 (H) 65 - 99 mg/dL   BUN 15 6 - 20 mg/dL   Creatinine, Ser 0.77 0.44 - 1.00 mg/dL   Calcium 9.6 8.9 - 10.3 mg/dL   Total Protein 7.7 6.5 - 8.1 g/dL   Albumin 3.8 3.5 - 5.0 g/dL   AST 26 15 - 41 U/L   ALT 24 14 - 54 U/L   Alkaline  Phosphatase 75 38 - 126 U/L   Total Bilirubin 0.4 0.3 - 1.2 mg/dL   GFR calc non Af Amer >60 >60 mL/min   GFR calc Af Amer >60 >60 mL/min    Comment: (NOTE) The eGFR has been calculated using the CKD EPI equation. This calculation has not been validated in all clinical situations. eGFR's persistently <60 mL/min signify possible Chronic Kidney Disease.    Anion gap 9 5 - 15  Lipase, blood     Status: None   Collection Time: 06/28/15  7:52 AM  Result Value Ref Range   Lipase 33 11 - 51 U/L  CBC with Differential     Status: Abnormal   Collection Time: 06/28/15  7:52 AM  Result Value Ref Range   WBC 13.8 (H) 4.0 - 10.5 K/uL   RBC 4.52 3.87 - 5.11 MIL/uL   Hemoglobin 14.0 12.0 - 15.0 g/dL   HCT 42.1 36.0 - 46.0 %   MCV 93.1 78.0 - 100.0 fL   MCH 31.0 26.0 - 34.0 pg   MCHC 33.3 30.0 - 36.0 g/dL   RDW 14.2 11.5 - 15.5 %  Platelets 268 150 - 400 K/uL   Neutrophils Relative % 81 %   Neutro Abs 11.0 (H) 1.7 - 7.7 K/uL   Lymphocytes Relative 16 %   Lymphs Abs 2.2 0.7 - 4.0 K/uL   Monocytes Relative 3 %   Monocytes Absolute 0.5 0.1 - 1.0 K/uL   Eosinophils Relative 0 %   Eosinophils Absolute 0.1 0.0 - 0.7 K/uL   Basophils Relative 0 %   Basophils Absolute 0.0 0.0 - 0.1 K/uL  Urinalysis, Routine w reflex microscopic     Status: Abnormal   Collection Time: 06/28/15  8:47 AM  Result Value Ref Range   Color, Urine YELLOW YELLOW   APPearance CLEAR CLEAR   Specific Gravity, Urine 1.020 1.005 - 1.030   pH 6.0 5.0 - 8.0   Glucose, UA NEGATIVE NEGATIVE mg/dL   Hgb urine dipstick TRACE (A) NEGATIVE   Bilirubin Urine NEGATIVE NEGATIVE   Ketones, ur NEGATIVE NEGATIVE mg/dL   Protein, ur NEGATIVE NEGATIVE mg/dL   Nitrite NEGATIVE NEGATIVE   Leukocytes, UA NEGATIVE NEGATIVE  Urine microscopic-add on     Status: Abnormal   Collection Time: 06/28/15  8:47 AM  Result Value Ref Range   Squamous Epithelial / LPF 0-5 (A) NONE SEEN   WBC, UA NONE SEEN 0 - 5 WBC/hpf   RBC / HPF 0-5 0 - 5  RBC/hpf   Bacteria, UA NONE SEEN NONE SEEN    Ct Abdomen Pelvis W Contrast  06/28/2015  CLINICAL DATA:  Lower abdominal pain with nausea and vomiting beginning earlier this morning. Prior cholecystectomy and hysterectomy. EXAM: CT ABDOMEN AND PELVIS WITH CONTRAST TECHNIQUE: Multidetector CT imaging of the abdomen and pelvis was performed using the standard protocol following bolus administration of intravenous contrast. CONTRAST:  139m OMNIPAQUE IOHEXOL 300 MG/ML  SOLN COMPARISON:  None. FINDINGS: There is a 3 mm nodule in the right middle lobe (series 3, image 3). Minimal dependent atelectasis is present in both lung bases. No pleural effusion. There is minimal extrahepatic and central intrahepatic biliary prominence in this patient status postcholecystectomy. The liver, spleen, adrenal glands, and pancreas are unremarkable. 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 The right kidney is ectopically located in the pelvis. There is no hydronephrosis. The stomach is moderately distended with oral contrast material. The appendix is not clearly identified, however no inflammatory change immediately about the cecum is identified to suggest acute appendicitis. Oral contrast is present in multiple loops of nondilated proximal small bowel. More distally, there is mild dilatation of several small bowel loops in the pelvis up to 3.2 cm in diameter with mild fecalization of small bowel contents. Mildly prominent small bowel wall enhancement is noted in this region. There is a transition to decompressed small bowel in the midline of the pelvis (series 2, image 61). A small amount of gas is present in nondilated ileum more distally. Gas and a normal amount of stool are present in the ascending, transverse, and descending colon. The sigmoid colon is decompressed. A small amount of free fluid is present in the pelvis. The bladder is unremarkable. Age advanced aortoiliac atherosclerosis is noted. No enlarged lymph nodes are  identified. The uterus is absent. Ovaries may also be absent. A small fat containing right inguinal hernia is noted. Postsurgical changes are noted in the right anterior abdominal wall. Degenerative changes are noted involving both hips. IMPRESSION: 1. Mild dilatation of several distal small bowel loops with transition point in the pelvis, compatible with partial/early obstruction and possibly due to adhesions.  2. 3 mm right middle lobe nodule. If the patient is at high risk for bronchogenic carcinoma, follow-up chest CT at 1 year is recommended. If the patient is at low risk, no follow-up is needed. This recommendation follows the consensus statement: Guidelines for Management of Small Pulmonary Nodules Detected on CT Scans: A Statement from the [place] as published in Radiology 2005; 237:395-400. Electronically Signed   By: Logan Bores M.D.   On: 06/28/2015 10:25    ROS: See chart Blood pressure 130/78, pulse 46, temperature 98 F (36.7 C), temperature source Oral, resp. rate 16, height '5\' 4"'  (1.626 m), weight 77.111 kg (170 lb), SpO2 98 %. Physical Exam: Pleasant black female in no acute distress. Abdomen is soft, nontender, nondistended. No hepatosplenomegaly, masses, or hernias identified. Rectal examination was deferred at this time.  Assessment/Plan: Impression: Small bowel obstruction seen on CT scan, question early bowel obstruction versus mild ileus. Patient does not need surgical intervention at this time. No need for NG tube at this time. Patient may have ice chips. Will repeat examination in a.m.  Elly Haffey A 06/28/2015, 11:46 AM

## 2015-06-28 NOTE — ED Notes (Signed)
MD at bedside. 

## 2015-06-28 NOTE — ED Provider Notes (Signed)
CSN: [id number]     Arrival date & time 06/28/15  0600 History   First MD Initiated Contact with Patient 06/28/15 [phone]     Chief Complaint  Patient presents with  . Abdominal Pain     (Consider location/radiation/quality/duration/timing/severity/associated sxs/prior Treatment) HPI Comments: Patient is a 70 year old female history of hypertension, cholecystectomy, appendectomy, hysterectomy. She presents for evaluation of lower abdominal pain that started yesterday evening. She states she initially thought it was gas, however the pain became worse in the night she began to vomit. She does admit to passing gas and having a bowel movement that was nonbloody. She denies any fevers or chills. She denies any ill contacts. She reports she has had prior colonoscopies showing diverticulosis, however no other abnormality.  Patient is a 70 y.o. female presenting with abdominal pain. The history is provided by the patient.  Abdominal Pain Pain location:  Suprapubic, LLQ and RLQ Pain quality: cramping and stabbing   Pain radiates to:  Does not radiate Pain severity:  Moderate Onset quality:  Sudden Duration:  12 hours Timing:  Constant Progression:  Worsening Chronicity:  New Relieved by:  Nothing Worsened by:  Movement and palpation Ineffective treatments:  None tried Associated symptoms: vomiting   Associated symptoms: no chills, no diarrhea and no fever     Past Medical History  Diagnosis Date  . Hypertension   . Thyroid disease   . Hyperlipidemia   . CHF (congestive heart failure) (HCC)     EF 45-50%  . Smoker    Past Surgical History  Procedure Laterality Date  . Vaginal hysterectomy      1986  . Back surgery    . Cholecystectomy    . Right knee meniscus    . Polypectomy-oropharynx    . Abdominal hysterectomy     Family History  Problem Relation Age of Onset  . Cancer Sister   . Diabetes Brother   . Hypertension Other   . Diabetes Brother    Social History  Substance  Use Topics  . Smoking status: Current Every Day Smoker -- 0.75 packs/day for 0 years    Types: Cigarettes  . Smokeless tobacco: Never Used     Comment: Smoker since 1975 has quit and started back several times!!  . Alcohol Use: No   OB History    Gravida Para Term Preterm AB TAB SAB Ectopic Multiple Living   4 3   1   1  3      Review of Systems  Constitutional: Negative for fever and chills.  Gastrointestinal: Positive for vomiting and abdominal pain. Negative for diarrhea.  All other systems reviewed and are negative.     Allergies  Review of patient's allergies indicates no known allergies.  Home Medications   Prior to Admission medications   Medication Sig Start Date End Date Taking? Authorizing Provider  amLODipine (NORVASC) 5 MG tablet TAKE 1 TABLET (5 MG TOTAL) BY MOUTH DAILY. 03/31/15  Yes Mary B Dixon, PA-C  atorvastatin (LIPITOR) 80 MG tablet TAKE 1 TABLET BY MOUTH AT BEDTIME Patient taking differently: TAKE 1 TABLET BY MOUTH AT BEDTIME 03/03/14  Yes Mary B Dixon, PA-C  carvedilol (COREG) 3.125 MG tablet TAKE 1 TABLET BY MOUTH TWICE A DAY WITH A MEAL 05/25/15  Yes Orlena Sheldon, PA-C  celecoxib (CELEBREX) 200 MG capsule Take 1 capsule (200 mg total) by mouth daily. 06/21/15  Yes Mary B Dixon, PA-C  levothyroxine (SYNTHROID, LEVOTHROID) 75 MCG tablet TAKE 1 TABLET (75 MCG TOTAL)  BY MOUTH DAILY. 02/08/15  Yes Mary B Dixon, PA-C  lisinopril (PRINIVIL,ZESTRIL) 20 MG tablet Take 0.5 tablets (10 mg total) by mouth daily. 01/03/15  Yes Thayer Headings, MD  Omega-3 Fatty Acids (FISH OIL PO) Take 1 tablet by mouth daily.   Yes Historical Provider, MD   BP 156/78 mmHg  Pulse 52  Temp(Src) 98.1 F (36.7 C)  Resp 22  Ht 5\' 4"  (1.626 m)  Wt 170 lb (77.111 kg)  BMI 29.17 kg/m2  SpO2 96% Physical Exam  Constitutional: She is oriented to person, place, and time. She appears well-developed and well-nourished. No distress.  HENT:  Head: Normocephalic and atraumatic.  Neck: Normal  range of motion. Neck supple.  Cardiovascular: Normal rate and regular rhythm.  Exam reveals no gallop and no friction rub.   No murmur heard. Pulmonary/Chest: Effort normal and breath sounds normal. No respiratory distress. She has no wheezes.  Abdominal: Soft. Bowel sounds are normal. She exhibits no distension and no mass. There is tenderness. There is no rebound and no guarding.  There is tenderness to palpation across the lower abdomen in the left lower quadrant, right lower quadrant, and suprapubic region.  Musculoskeletal: Normal range of motion.  Neurological: She is alert and oriented to person, place, and time.  Skin: Skin is warm and dry. She is not diaphoretic.  Nursing note and vitals reviewed.   ED Course  Procedures (including critical care time) Labs Review Labs Reviewed - No data to display  Imaging Review No results found. I have personally reviewed and evaluated these images and lab results as part of my medical decision-making.   EKG Interpretation None      MDM   Final diagnoses:  None    Workup reveals a small bowel obstruction. This will be treated with an NG tube and admission to medicine. I have discussed this case with Dr. Arnoldo Morale from general surgery who will evaluate the patient in consultation.    Veryl Speak, MD 06/28/15 1059

## 2015-06-28 NOTE — ED Notes (Signed)
Report given to Theola Sequin, RN for room 314.

## 2015-06-28 NOTE — ED Notes (Signed)
Dr. Arnoldo Morale in room with pt at this time.

## 2015-06-28 NOTE — H&P (Signed)
[facility] History and Physical  Adrienne [name] [id number] DOB: [birth year] DOA: 06/28/2015  Referring physician:Dr. Stark Jock PCP: Karis Juba, PA-C   Chief Complaint: Abdominal pain  HPI: Adrienne [name] is a 70 y.o. female past medical history of hypertension and hypothyroidism came into the hospital complaining about lower abdominal pain. Patient reports that she was in her usual state of health till earlier this morning when she woke up with severe lower abdominal pain involving the supra pubic and lower pelvic area. Pain is dull does not radiate, associated with nausea and she vomited once. Patient had 2 bowel movements yesterday and one bowel movement earlier today so she came into the hospital for further evaluation. In the ED CT scan of abdomen/pelvis was done and showed possible small bowel obstruction with fecalization for small bowel, patient currently not nauseous and the pain is gone. General surgery consulted, recommended admission to the hospitalists service.  Review of Systems:  Constitutional: negative for anorexia, fevers and sweats Eyes: negative for irritation, redness and visual disturbance Ears, nose, mouth, throat, and face: negative for earaches, epistaxis, nasal congestion and sore throat Respiratory: negative for cough, dyspnea on exertion, sputum and wheezing Cardiovascular: negative for chest pain, dyspnea, lower extremity edema, orthopnea, palpitations and syncope Gastrointestinal: Abdominal pain. History of present illness Genitourinary:negative for dysuria, frequency and hematuria Hematologic/lymphatic: negative for bleeding, easy bruising and lymphadenopathy Musculoskeletal:negative for arthralgias, muscle weakness and stiff joints Neurological: negative for coordination problems, gait problems, headaches and weakness Endocrine: negative for diabetic symptoms including polydipsia, polyuria and weight loss Allergic/Immunologic: negative for  anaphylaxis, hay fever and urticaria  Past Medical History  Diagnosis Date  . Hypertension   . Thyroid disease   . Hyperlipidemia   . CHF (congestive heart failure) (HCC)     EF 45-50%  . Smoker    Past Surgical History  Procedure Laterality Date  . Vaginal hysterectomy      1986  . Back surgery    . Cholecystectomy    . Right knee meniscus    . Polypectomy-oropharynx    . Abdominal hysterectomy     Social History:   reports that she has been smoking Cigarettes.  She has been smoking about 0.75 packs per day for the past 0 years. She has never used smokeless tobacco. She reports that she does not drink alcohol or use illicit drugs.  No Known Allergies  Family History  Problem Relation Age of Onset  . Cancer Sister   . Diabetes Brother   . Hypertension Other   . Diabetes Brother      Prior to Admission medications   Medication Sig Start Date End Date Taking? Authorizing Provider  amLODipine (NORVASC) 5 MG tablet TAKE 1 TABLET (5 MG TOTAL) BY MOUTH DAILY. 03/31/15  Yes Mary B Dixon, PA-C  atorvastatin (LIPITOR) 80 MG tablet TAKE 1 TABLET BY MOUTH AT BEDTIME Patient taking differently: TAKE 1 TABLET BY MOUTH AT BEDTIME 03/03/14  Yes Mary B Dixon, PA-C  carvedilol (COREG) 3.125 MG tablet TAKE 1 TABLET BY MOUTH TWICE A DAY WITH A MEAL 05/25/15  Yes Orlena Sheldon, PA-C  celecoxib (CELEBREX) 200 MG capsule Take 1 capsule (200 mg total) by mouth daily. 06/21/15  Yes Mary B Dixon, PA-C  levothyroxine (SYNTHROID, LEVOTHROID) 75 MCG tablet TAKE 1 TABLET (75 MCG TOTAL) BY MOUTH DAILY. 02/08/15  Yes Mary B Dixon, PA-C  lisinopril (PRINIVIL,ZESTRIL) 20 MG tablet Take 0.5 tablets (10 mg total) by mouth daily. 01/03/15  Yes Wonda Cheng Nahser,  MD  Omega-3 Fatty Acids (FISH OIL PO) Take 1 tablet by mouth daily.   Yes Historical Provider, MD   Physical Exam: Filed Vitals:   06/28/15 1030 06/28/15 1127  BP: 157/84 130/78  Pulse: 50 46  Temp:  98 F (36.7 C)  Resp: 16 16   Constitutional:  Oriented to person, place, and time. Well-developed and well-nourished. Cooperative.  Head: Normocephalic and atraumatic.  Nose: Nose normal.  Mouth/Throat: Uvula is midline, oropharynx is clear and moist and mucous membranes are normal.  Eyes: Conjunctivae and EOM are normal. Pupils are equal, round, and reactive to light.  Neck: Trachea normal and normal range of motion. Neck supple.  Cardiovascular: Normal rate, regular rhythm, S1 normal, S2 normal, normal heart sounds and intact distal pulses.   Pulmonary/Chest: Effort normal and breath sounds normal.  Abdominal: Soft. Bowel sounds are normal. There is no hepatosplenomegaly. There is no tenderness.  Musculoskeletal: Normal range of motion.  Neurological: Alert and oriented to person, place, and time. Has normal strength. No cranial nerve deficit or sensory deficit.  Skin: Skin is warm, dry and intact.  Psychiatric: Has a normal mood and affect. Speech is normal and behavior is normal.   Labs on Admission:  Basic Metabolic Panel:  Recent Labs Lab 06/28/15 0752  NA 142  K 4.0  CL 108  CO2 25  GLUCOSE 133*  BUN 15  CREATININE 0.77  CALCIUM 9.6   Liver Function Tests:  Recent Labs Lab 06/28/15 0752  AST 26  ALT 24  ALKPHOS 75  BILITOT 0.4  PROT 7.7  ALBUMIN 3.8    Recent Labs Lab 06/28/15 0752  LIPASE 33   No results for input(s): AMMONIA in the last 168 hours. CBC:  Recent Labs Lab 06/28/15 0752  WBC 13.8*  NEUTROABS 11.0*  HGB 14.0  HCT 42.1  MCV 93.1  PLT 268   Cardiac Enzymes: No results for input(s): CKTOTAL, CKMB, CKMBINDEX, TROPONINI in the last 168 hours.  BNP (last 3 results) No results for input(s): BNP in the last 8760 hours.  ProBNP (last 3 results) No results for input(s): PROBNP in the last 8760 hours.  CBG: No results for input(s): GLUCAP in the last 168 hours.  Radiological Exams on Admission: Ct Abdomen Pelvis W Contrast  06/28/2015  CLINICAL DATA:  Lower abdominal pain with  nausea and vomiting beginning earlier this morning. Prior cholecystectomy and hysterectomy. EXAM: CT ABDOMEN AND PELVIS WITH CONTRAST TECHNIQUE: Multidetector CT imaging of the abdomen and pelvis was performed using the standard protocol following bolus administration of intravenous contrast. CONTRAST:  112mL OMNIPAQUE IOHEXOL 300 MG/ML  SOLN COMPARISON:  None. FINDINGS: There is a 3 mm nodule in the right middle lobe (series 3, image 3). Minimal dependent atelectasis is present in both lung bases. No pleural effusion. There is minimal extrahepatic and central intrahepatic biliary prominence in this patient status postcholecystectomy. The liver, spleen, adrenal glands, and pancreas are unremarkable. 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 The right kidney is ectopically located in the pelvis. There is no hydronephrosis. The stomach is moderately distended with oral contrast material. The appendix is not clearly identified, however no inflammatory change immediately about the cecum is identified to suggest acute appendicitis. Oral contrast is present in multiple loops of nondilated proximal small bowel. More distally, there is mild dilatation of several small bowel loops in the pelvis up to 3.2 cm in diameter with mild fecalization of small bowel contents. Mildly prominent small bowel wall enhancement is noted in this  region. There is a transition to decompressed small bowel in the midline of the pelvis (series 2, image 61). A small amount of gas is present in nondilated ileum more distally. Gas and a normal amount of stool are present in the ascending, transverse, and descending colon. The sigmoid colon is decompressed. A small amount of free fluid is present in the pelvis. The bladder is unremarkable. Age advanced aortoiliac atherosclerosis is noted. No enlarged lymph nodes are identified. The uterus is absent. Ovaries may also be absent. A small fat containing right inguinal hernia is noted. Postsurgical changes are  noted in the right anterior abdominal wall. Degenerative changes are noted involving both hips. IMPRESSION: 1. Mild dilatation of several distal small bowel loops with transition point in the pelvis, compatible with partial/early obstruction and possibly due to adhesions. 2. 3 mm right middle lobe nodule. If the patient is at high risk for bronchogenic carcinoma, follow-up chest CT at 1 year is recommended. If the patient is at low risk, no follow-up is needed. This recommendation follows the consensus statement: Guidelines for Management of Small Pulmonary Nodules Detected on CT Scans: A Statement from the [place] as published in Radiology 2005; 237:395-400. Electronically Signed   By: Logan Bores M.D.   On: 06/28/2015 10:25    EKG: Independently reviewed. Pending  Assessment/Plan Principal Problem:   Small bowel obstruction (HCC) Active Problems:   Hypertension   Chronic systolic congestive heart failure (HCC)   Hypothyroidism   Smoker    Small bowel obstruction Likely partial SBO, patient had 2 BMs yesterday and one BM today, vomited once earlier today. CT scan showed dilatation of SB loops and possible transition point in the pelvis could be due to adhesions. Gen. surgery consulted. Treat conservatively, patient will be nothing by mouth, consider NG tube if developed nausea and vomiting.  Hypertension Continue home medications.  Congestive heart failure, chronic Chronic congestive heart failure with preserved LVEF, last 2-D echo in January 2015 showed LVEF of 55-60%. No symptoms or signs of decompensation, patient will be on IV fluids, watch and monitor closely.  Hypothyroidism Check TSH and continue Synthroid.  Smoker Patient smokes half to 1 pack per day "depends on my day" counseled about quitting.    Code Status: Full code Family Communication: Plan discussed with the patient in the presence of her daughter at bedside. Disposition Plan: None  Time spent: 70  minutes  Special Ranes A, MD [facility] Pager [phone]

## 2015-06-28 NOTE — ED Notes (Signed)
Pt c/o lower abd pain with n/v since 0230

## 2015-06-29 DIAGNOSIS — I5022 Chronic systolic (congestive) heart failure: Secondary | ICD-10-CM | POA: Diagnosis not present

## 2015-06-29 DIAGNOSIS — K5660 Unspecified intestinal obstruction: Secondary | ICD-10-CM | POA: Diagnosis not present

## 2015-06-29 DIAGNOSIS — K5669 Other intestinal obstruction: Secondary | ICD-10-CM | POA: Diagnosis not present

## 2015-06-29 DIAGNOSIS — E876 Hypokalemia: Secondary | ICD-10-CM | POA: Diagnosis not present

## 2015-06-29 DIAGNOSIS — R001 Bradycardia, unspecified: Secondary | ICD-10-CM | POA: Diagnosis present

## 2015-06-29 DIAGNOSIS — I110 Hypertensive heart disease with heart failure: Secondary | ICD-10-CM | POA: Diagnosis not present

## 2015-06-29 LAB — HEMOGLOBIN A1C
Hgb A1c MFr Bld: 6.1 % — ABNORMAL HIGH (ref 4.8–5.6)
Mean Plasma Glucose: 128 mg/dL

## 2015-06-29 LAB — BASIC METABOLIC PANEL
ANION GAP: 8 (ref 5–15)
BUN: 7 mg/dL (ref 6–20)
CHLORIDE: 108 mmol/L (ref 101–111)
CO2: 25 mmol/L (ref 22–32)
Calcium: 8.8 mg/dL — ABNORMAL LOW (ref 8.9–10.3)
Creatinine, Ser: 0.65 mg/dL (ref 0.44–1.00)
GFR calc Af Amer: >60 mL/min (ref >60)
GFR calc non Af Amer: >60 mL/min (ref >60)
GLUCOSE: 88 mg/dL (ref 65–99)
POTASSIUM: 3.4 mmol/L — AB (ref 3.5–5.1)
SODIUM: 141 mmol/L (ref 135–145)

## 2015-06-29 LAB — CBC
HEMATOCRIT: 40.3 % (ref 36.0–46.0)
HEMOGLOBIN: 13.3 g/dL (ref 12.0–15.0)
MCH: 30.6 pg (ref 26.0–34.0)
MCHC: 33 g/dL (ref 30.0–36.0)
MCV: 92.9 fL (ref 78.0–100.0)
Platelets: 261 10*3/uL (ref 150–400)
RBC: 4.34 MIL/uL (ref 3.87–5.11)
RDW: 14.2 % (ref 11.5–15.5)
WBC: 8.2 10*3/uL (ref 4.0–10.5)

## 2015-06-29 MED ORDER — POTASSIUM CHLORIDE CRYS ER 20 MEQ PO TBCR
40.0000 meq | EXTENDED_RELEASE_TABLET | Freq: Once | ORAL | Status: AC
  Administered 2015-06-29: 40 meq via ORAL
  Filled 2015-06-29: qty 2

## 2015-06-29 MED ORDER — KCL IN DEXTROSE-NACL 40-5-0.45 MEQ/L-%-% IV SOLN
INTRAVENOUS | Status: DC
  Administered 2015-06-29: 11:00:00 via INTRAVENOUS

## 2015-06-29 MED ORDER — POLYETHYLENE GLYCOL 3350 17 G PO PACK
17.0000 g | PACK | Freq: Two times a day (BID) | ORAL | Status: DC
  Administered 2015-06-29 – 2015-06-30 (×3): 17 g via ORAL
  Filled 2015-06-29 (×3): qty 1

## 2015-06-29 NOTE — Progress Notes (Signed)
Subjective: Patient has passed flatus, but has not had a bowel movement today. No emesis noted. No abdominal pain noted.  Objective: Vital signs in last 24 hours: Temp:  [98 F (36.7 C)-98.7 F (37.1 C)] 98.2 F (36.8 C) (01/05 0631) Pulse Rate:  [46-50] 46 (01/05 0631) Resp:  [16-20] 20 (01/05 0631) BP: (126-162)/(65-87) 148/80 mmHg (01/05 0631) SpO2:  [93 %-100 %] 98 % (01/05 0631) Last BM Date: 06/28/15  Intake/Output from previous day: 01/04 0701 - 01/05 0700 In: 493.8 [P.O.:240; I.V.:253.8] Out: -  Intake/Output this shift:    General appearance: alert, cooperative and no distress GI: soft, non-tender; bowel sounds normal; no masses,  no organomegaly  Lab Results:   Recent Labs  06/28/15 0752 06/29/15 0613  WBC 13.8* 8.2  HGB 14.0 13.3  HCT 42.1 40.3  PLT 268 261   BMET  Recent Labs  06/28/15 0752 06/29/15 0613  NA 142 141  K 4.0 3.4*  CL 108 108  CO2 25 25  GLUCOSE 133* 88  BUN 15 7  CREATININE 0.77 0.65  CALCIUM 9.6 8.8*   PT/INR  Recent Labs  06/28/15 0752  LABPROT 13.2  INR 0.98    Studies/Results: Ct Abdomen Pelvis W Contrast  06/28/2015  CLINICAL DATA:  Lower abdominal pain with nausea and vomiting beginning earlier this morning. Prior cholecystectomy and hysterectomy. EXAM: CT ABDOMEN AND PELVIS WITH CONTRAST TECHNIQUE: Multidetector CT imaging of the abdomen and pelvis was performed using the standard protocol following bolus administration of intravenous contrast. CONTRAST:  157mL OMNIPAQUE IOHEXOL 300 MG/ML  SOLN COMPARISON:  None. FINDINGS: There is a 3 mm nodule in the right middle lobe (series 3, image 3). Minimal dependent atelectasis is present in both lung bases. No pleural effusion. There is minimal extrahepatic and central intrahepatic biliary prominence in this patient status postcholecystectomy. The liver, spleen, adrenal glands, and pancreas are unremarkable. 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 The right kidney is  ectopically located in the pelvis. There is no hydronephrosis. The stomach is moderately distended with oral contrast material. The appendix is not clearly identified, however no inflammatory change immediately about the cecum is identified to suggest acute appendicitis. Oral contrast is present in multiple loops of nondilated proximal small bowel. More distally, there is mild dilatation of several small bowel loops in the pelvis up to 3.2 cm in diameter with mild fecalization of small bowel contents. Mildly prominent small bowel wall enhancement is noted in this region. There is a transition to decompressed small bowel in the midline of the pelvis (series 2, image 61). A small amount of gas is present in nondilated ileum more distally. Gas and a normal amount of stool are present in the ascending, transverse, and descending colon. The sigmoid colon is decompressed. A small amount of free fluid is present in the pelvis. The bladder is unremarkable. Age advanced aortoiliac atherosclerosis is noted. No enlarged lymph nodes are identified. The uterus is absent. Ovaries may also be absent. A small fat containing right inguinal hernia is noted. Postsurgical changes are noted in the right anterior abdominal wall. Degenerative changes are noted involving both hips. IMPRESSION: 1. Mild dilatation of several distal small bowel loops with transition point in the pelvis, compatible with partial/early obstruction and possibly due to adhesions. 2. 3 mm right middle lobe nodule. If the patient is at high risk for bronchogenic carcinoma, follow-up chest CT at 1 year is recommended. If the patient is at low risk, no follow-up is needed. This recommendation follows the  consensus statement: Guidelines for Management of Small Pulmonary Nodules Detected on CT Scans: A Statement from the [place] as published in Radiology 2005; 237:395-400. Electronically Signed   By: Logan Bores M.D.   On: 06/28/2015 10:25     Anti-infectives: Anti-infectives    None      Assessment/Plan: Impression:  Small bowel obstruction.  Is passing flatus.  Will advance to clear liquid diet, add miralax.  No need for operative intervention.   LOS: 1 day   Sabriah Hobbins A 06/29/2015

## 2015-06-29 NOTE — Progress Notes (Signed)
Patient had EKG. MD notified of Results. Pt's HR was 39 on EKG. Rechecked HR and it was 47. MD ordered cardiac monitoring. RN will place it on patient with verification. RN passed on information to oncoming nurse at 67. Oswald Hillock, RN

## 2015-06-29 NOTE — Progress Notes (Signed)
**Note De-Identified Valdemar Mcclenahan Obfuscation** EKG done ; results reported to RN

## 2015-06-29 NOTE — Progress Notes (Signed)
Patient has ambulated multiple times today. Tolerated regular diet for dinner without problem. No pain and no nausea.

## 2015-06-29 NOTE — Progress Notes (Signed)
[facility] PROGRESS NOTE   Adrienne [name] [id number] DOB: 1946-04-08 DOA: 06/28/2015 PCP: Dena Billet BETH, PA-C  HPI/Subjective: Seen with daughter at bedside, passed flatus but no bowel movement.  Assessment/Plan: Principal Problem:   Small bowel obstruction (HCC) Active Problems:   Hypertension   Chronic systolic congestive heart failure (HCC)   Hypothyroidism   Smoker   Bradycardia   Hypokalemia   Small bowel obstruction Likely partial SBO, patient had 2 BMs yesterday and one BM today, vomited once earlier today. CT scan showed dilatation of SB loops and possible transition point in the pelvis could be due to adhesions. Gen. surgery consulted, appreciate Dr. Arnoldo Morale help. Clear liquids, MiraLAX added, continue ambulation.  Hypokalemia Replete with IV and oral supplements, keep potassium above 4.  Bradycardia Heart rate is in the low 40s, placed on cardiac monitor, beta blockers discontinued.  Normal TSH. EKG did not show heart block. Asymptomatic, continue to monitor closely.  Hypertension Continue home medications.  Congestive heart failure, chronic Chronic congestive heart failure with preserved LVEF, last 2-D echo in January 2015 showed LVEF of 55-60%. No symptoms or signs of decompensation, patient will be on IV fluids, watch and monitor closely.  Hypothyroidism Check TSH and continue Synthroid.  Smoker Patient smokes half to 1 pack per day "depends on my day" counseled about quitting.   Code Status: Full Code Family Communication: Plan discussed with the patient. Disposition Plan: Remains inpatient Diet: Diet clear liquid Room service appropriate?: Yes; Fluid consistency:: Thin  Consultants:  None  Procedures:  None*  Antibiotics:  None   Objective: Filed Vitals:   06/29/15 0631 06/29/15 1011  BP: 148/80 139/78  Pulse: 46 46  Temp: 98.2 F (36.8 C)   Resp: 20 18    Intake/Output Summary (Last 24 hours) at 06/29/15  1106 Last data filed at 06/29/15 1015  Gross per 24 hour  Intake 733.75 ml  Output      0 ml  Net 733.75 ml   Filed Weights   06/28/15 0623  Weight: 77.111 kg (170 lb)    Exam: General: Alert and awake, oriented x3, not in any acute distress. HEENT: anicteric sclera, pupils reactive to light and accommodation, EOMI CVS: S1-S2 clear, no murmur rubs or gallops Chest: clear to auscultation bilaterally, no wheezing, rales or rhonchi Abdomen: soft nontender, nondistended, normal bowel sounds, no organomegaly Extremities: no cyanosis, clubbing or edema noted bilaterally Neuro: Cranial nerves II-XII intact, no focal neurological deficits  Data Reviewed: Basic Metabolic Panel:  Recent Labs Lab 06/28/15 0752 06/29/15 0613  NA 142 141  K 4.0 3.4*  CL 108 108  CO2 25 25  GLUCOSE 133* 88  BUN 15 7  CREATININE 0.77 0.65  CALCIUM 9.6 8.8*   Liver Function Tests:  Recent Labs Lab 06/28/15 0752  AST 26  ALT 24  ALKPHOS 75  BILITOT 0.4  PROT 7.7  ALBUMIN 3.8    Recent Labs Lab 06/28/15 0752  LIPASE 33   No results for input(s): AMMONIA in the last 168 hours. CBC:  Recent Labs Lab 06/28/15 0752 06/29/15 0613  WBC 13.8* 8.2  NEUTROABS 11.0*  --   HGB 14.0 13.3  HCT 42.1 40.3  MCV 93.1 92.9  PLT 268 261   Cardiac Enzymes: No results for input(s): CKTOTAL, CKMB, CKMBINDEX, TROPONINI in the last 168 hours. BNP (last 3 results) No results for input(s): BNP in the last 8760 hours.  ProBNP (last 3 results) No results for input(s): PROBNP in the last 8760 hours.  CBG: No results for input(s): GLUCAP in the last 168 hours.  Micro No results found for this or any previous visit (from the past 240 hour(s)).   Studies: Ct Abdomen Pelvis W Contrast  06/28/2015  CLINICAL DATA:  Lower abdominal pain with nausea and vomiting beginning earlier this morning. Prior cholecystectomy and hysterectomy. EXAM: CT ABDOMEN AND PELVIS WITH CONTRAST TECHNIQUE: Multidetector CT  imaging of the abdomen and pelvis was performed using the standard protocol following bolus administration of intravenous contrast. CONTRAST:  134mL OMNIPAQUE IOHEXOL 300 MG/ML  SOLN COMPARISON:  None. FINDINGS: There is a 3 mm nodule in the right middle lobe (series 3, image 3). Minimal dependent atelectasis is present in both lung bases. No pleural effusion. There is minimal extrahepatic and central intrahepatic biliary prominence in this patient status postcholecystectomy. The liver, spleen, adrenal glands, and pancreas are unremarkable. 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 The right kidney is ectopically located in the pelvis. There is no hydronephrosis. The stomach is moderately distended with oral contrast material. The appendix is not clearly identified, however no inflammatory change immediately about the cecum is identified to suggest acute appendicitis. Oral contrast is present in multiple loops of nondilated proximal small bowel. More distally, there is mild dilatation of several small bowel loops in the pelvis up to 3.2 cm in diameter with mild fecalization of small bowel contents. Mildly prominent small bowel wall enhancement is noted in this region. There is a transition to decompressed small bowel in the midline of the pelvis (series 2, image 61). A small amount of gas is present in nondilated ileum more distally. Gas and a normal amount of stool are present in the ascending, transverse, and descending colon. The sigmoid colon is decompressed. A small amount of free fluid is present in the pelvis. The bladder is unremarkable. Age advanced aortoiliac atherosclerosis is noted. No enlarged lymph nodes are identified. The uterus is absent. Ovaries may also be absent. A small fat containing right inguinal hernia is noted. Postsurgical changes are noted in the right anterior abdominal wall. Degenerative changes are noted involving both hips. IMPRESSION: 1. Mild dilatation of several distal small bowel  loops with transition point in the pelvis, compatible with partial/early obstruction and possibly due to adhesions. 2. 3 mm right middle lobe nodule. If the patient is at high risk for bronchogenic carcinoma, follow-up chest CT at 1 year is recommended. If the patient is at low risk, no follow-up is needed. This recommendation follows the consensus statement: Guidelines for Management of Small Pulmonary Nodules Detected on CT Scans: A Statement from the [place] as published in Radiology 2005; 237:395-400. Electronically Signed   By: Logan Bores M.D.   On: 06/28/2015 10:25    Scheduled Meds: . amLODipine  5 mg Oral Daily  . heparin  5,000 Units Subcutaneous 3 times per day  . levothyroxine  75 mcg Oral QAC breakfast  . polyethylene glycol  17 g Oral BID   Continuous Infusions: . dextrose 5 % and 0.45 % NaCl with KCl 40 mEq/L 75 mL/hr at 06/29/15 1039       Time spent: 35 minutes    [facility] A  [facility] Pager [phone] If 7PM-7AM, please contact night-coverage at [email or web address], password [facility] 06/29/2015, 11:06 AM  LOS: 1 day

## 2015-06-29 NOTE — Care Management Note (Signed)
Case Management Note  Patient Details  Name: KYM SIDDONS MRN: [id number] Date of Birth: 1945/07/03  Subjective/Objective:                  Pt admitted from home with SBO. Pt lives with family and will return home at discharge. Pt is independent with ADL's.   Action/Plan: No CM needs noted.  Expected Discharge Date:                  Expected Discharge Plan:  Home/Self Care  In-House Referral:  NA  Discharge planning Services  CM Consult  Post Acute Care Choice:  NA Choice offered to:  NA  DME Arranged:    DME Agency:     HH Arranged:    HH Agency:     Status of Service:  Completed, signed off  Medicare Important Message Given:    Date Medicare IM Given:    Medicare IM give by:    Date Additional Medicare IM Given:    Additional Medicare Important Message give by:     If discussed at [place] of Stay Meetings, dates discussed:    Additional Comments:  Joylene Draft, RN 06/29/2015, 2:06 PM

## 2015-06-30 LAB — BASIC METABOLIC PANEL
ANION GAP: 8 (ref 5–15)
BUN: 8 mg/dL (ref 6–20)
CHLORIDE: 108 mmol/L (ref 101–111)
CO2: 26 mmol/L (ref 22–32)
Calcium: 9.2 mg/dL (ref 8.9–10.3)
Creatinine, Ser: 0.66 mg/dL (ref 0.44–1.00)
Glucose, Bld: 94 mg/dL (ref 65–99)
POTASSIUM: 3.8 mmol/L (ref 3.5–5.1)
SODIUM: 142 mmol/L (ref 135–145)

## 2015-06-30 MED ORDER — POLYETHYLENE GLYCOL 3350 17 G PO PACK: 17.0000 g | PACK | Freq: Two times a day (BID) | ORAL | Status: DC

## 2015-06-30 NOTE — Progress Notes (Signed)
Discharge instructions reviewed with patient. Patient verbalized understanding of all instructions  Discharged to home with family

## 2015-06-30 NOTE — Discharge Summary (Signed)
Physician Discharge Summary  Adrienne [name] [id number] DOB: 10/10/1945 DOA: 06/28/2015  PCP: Karis Juba, PA-C  Admit date: 06/28/2015 Discharge date: 06/30/2015  Time spent: 40 minutes  Recommendations for Outpatient Follow-up:  1. Follow-up with primary care physician within one week. 2. Discontinued Coreg secondary to significant bradycardia.   Discharge Diagnoses:  Principal Problem:   Small bowel obstruction (Adrienne [name]) Active Problems:   Hypertension   Chronic systolic congestive heart failure (HCC)   Hypothyroidism   Smoker   Bradycardia   Hypokalemia   Discharge Condition: Stable  Diet recommendation: Heart healthy  Filed Weights   06/28/15 0623  Weight: 77.111 kg (170 lb)    History of present illness:  Adrienne [name] is a 70 y.o. female past medical history of hypertension and hypothyroidism came into the hospital complaining about lower abdominal pain. Patient reports that she was in her usual state of health till earlier this morning when she woke up with severe lower abdominal pain involving the supra pubic and lower pelvic area. Pain is dull does not radiate, associated with nausea and she vomited once. Patient had 2 bowel movements yesterday and one bowel movement earlier today so she came into the hospital for further evaluation. In the ED CT scan of abdomen/pelvis was done and showed possible small bowel obstruction with fecalization for small bowel, patient currently not nauseous and the pain is gone. General surgery consulted, recommended admission to the hospitalists service.  Hospital Course:   Small bowel obstruction Likely partial SBO, patient had 2 BMs prior to discharge, vomited once on admission day CT scan showed dilatation of SB loops and possible transition point in the pelvis could be due to adhesions. Gen. surgery consulted, appreciate Dr. Arnoldo Morale help. No NG tube placed, did not have abdominal distention or intractable nausea. Initially was  nothing by mouth, was passing status so diet advanced to clear liquids. She had a bowel movement yesterday and a small one earlier today, no nausea or vomiting tolerated diet well, discharged home. Advised to take MiraLAX daily.  Hypokalemia Replete with IV and oral supplements.  Bradycardia, significant sinus Heart rate is in the low 40s, placed on cardiac monitor, beta blockers discontinued.  Normal TSH. 12 lead EKG showed sinus bradycardia EKG did not show heart block. Asymptomatic, to follow with PCP.  Hypertension Continue home medications.  Congestive heart failure, chronic Chronic congestive heart failure with preserved LVEF, last 2-D echo in January 2015 showed LVEF of 55-60%. No symptoms or signs of decompensation, patient will be on IV fluids, watch and monitor closely.  Hypothyroidism Check TSH and continue Synthroid.  Smoker Patient smokes half to 1 pack per day "depends on my day" counseled about quitting.    Procedures:  None  Consultations:  General surgery  Discharge Exam: Filed Vitals:   06/29/15 2200 06/30/15 0543  BP: 169/76 148/79  Pulse: 49 53  Temp: 98.2 F (36.8 C) 98.6 F (37 C)  Resp: 20 20   General: Alert and awake, oriented x3, not in any acute distress. HEENT: anicteric sclera, pupils reactive to light and accommodation, EOMI CVS: S1-S2 clear, no murmur rubs or gallops Chest: clear to auscultation bilaterally, no wheezing, rales or rhonchi Abdomen: soft nontender, nondistended, normal bowel sounds, no organomegaly Extremities: no cyanosis, clubbing or edema noted bilaterally Neuro: Cranial nerves II-XII intact, no focal neurological deficits  Discharge Instructions   Discharge Instructions    Diet - low sodium heart healthy    Complete by:  As directed  Increase activity slowly    Complete by:  As directed           Current Discharge Medication List    START taking these medications   Details  polyethylene glycol  (MIRALAX / GLYCOLAX) packet Take 17 g by mouth 2 (two) times daily. Qty: 30 each, Refills: 0      CONTINUE these medications which have NOT CHANGED   Details  amLODipine (NORVASC) 5 MG tablet TAKE 1 TABLET (5 MG TOTAL) BY MOUTH DAILY. Qty: 90 tablet, Refills: 1    aspirin 81 MG tablet Take 81 mg by mouth daily.    levothyroxine (SYNTHROID, LEVOTHROID) 75 MCG tablet TAKE 1 TABLET (75 MCG TOTAL) BY MOUTH DAILY. Qty: 90 tablet, Refills: 1    lisinopril (PRINIVIL,ZESTRIL) 20 MG tablet Take 0.5 tablets (10 mg total) by mouth daily. Qty: 30 tablet, Refills: 2    Multiple Vitamins-Minerals (MULTIVITAMIN WITH MINERALS) tablet Take 1 tablet by mouth daily.    Omega-3 Fatty Acids (FISH OIL PO) Take 1 tablet by mouth daily.    atorvastatin (LIPITOR) 80 MG tablet TAKE 1 TABLET BY MOUTH AT BEDTIME Qty: 90 tablet, Refills: 3   Associated Diagnoses: HLD (hyperlipidemia)    celecoxib (CELEBREX) 200 MG capsule Take 1 capsule (200 mg total) by mouth daily. Qty: 30 capsule, Refills: 5   Associated Diagnoses: Osteoarthritis of right knee, unspecified osteoarthritis type      STOP taking these medications     carvedilol (COREG) 3.125 MG tablet        No Known Allergies Follow-up Information    Follow up with [facility] BETH, PA-C.   Specialty:  Physician Assistant   Contact information:   [place] [address] [phone]        The results of significant diagnostics from this hospitalization (including imaging, microbiology, ancillary and laboratory) are listed below for reference.    Significant Diagnostic Studies: Ct Abdomen Pelvis W Contrast  06/28/2015  CLINICAL DATA:  Lower abdominal pain with nausea and vomiting beginning earlier this morning. Prior cholecystectomy and hysterectomy. EXAM: CT ABDOMEN AND PELVIS WITH CONTRAST TECHNIQUE: Multidetector CT imaging of the abdomen and pelvis was performed using the standard protocol following bolus administration  of intravenous contrast. CONTRAST:  179mL OMNIPAQUE IOHEXOL 300 MG/ML  SOLN COMPARISON:  None. FINDINGS: There is a 3 mm nodule in the right middle lobe (series 3, image 3). Minimal dependent atelectasis is present in both lung bases. No pleural effusion. There is minimal extrahepatic and central intrahepatic biliary prominence in this patient status postcholecystectomy. The liver, spleen, adrenal glands, and pancreas are unremarkable. 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 The right kidney is ectopically located in the pelvis. There is no hydronephrosis. The stomach is moderately distended with oral contrast material. The appendix is not clearly identified, however no inflammatory change immediately about the cecum is identified to suggest acute appendicitis. Oral contrast is present in multiple loops of nondilated proximal small bowel. More distally, there is mild dilatation of several small bowel loops in the pelvis up to 3.2 cm in diameter with mild fecalization of small bowel contents. Mildly prominent small bowel wall enhancement is noted in this region. There is a transition to decompressed small bowel in the midline of the pelvis (series 2, image 61). A small amount of gas is present in nondilated ileum more distally. Gas and a normal amount of stool are present in the ascending, transverse, and descending colon. The sigmoid colon is decompressed. A  small amount of free fluid is present in the pelvis. The bladder is unremarkable. Age advanced aortoiliac atherosclerosis is noted. No enlarged lymph nodes are identified. The uterus is absent. Ovaries may also be absent. A small fat containing right inguinal hernia is noted. Postsurgical changes are noted in the right anterior abdominal wall. Degenerative changes are noted involving both hips. IMPRESSION: 1. Mild dilatation of several distal small bowel loops with transition point in the pelvis, compatible with partial/early obstruction and possibly due to  adhesions. 2. 3 mm right middle lobe nodule. If the patient is at high risk for bronchogenic carcinoma, follow-up chest CT at 1 year is recommended. If the patient is at low risk, no follow-up is needed. This recommendation follows the consensus statement: Guidelines for Management of Small Pulmonary Nodules Detected on CT Scans: A Statement from the [employer] [place] as published in Radiology 2005; 237:395-400. Electronically Signed   By: Logan Bores M.D.   On: 06/28/2015 10:25    Microbiology: No results found for this or any previous visit (from the past 240 hour(s)).   Labs: Basic Metabolic Panel:  Recent Labs Lab 06/28/15 0752 06/29/15 0613 06/30/15 0622  NA 142 141 142  K 4.0 3.4* 3.8  CL 108 108 108  CO2 25 25 26   GLUCOSE 133* 88 94  BUN 15 7 8   CREATININE 0.77 0.65 0.66  CALCIUM 9.6 8.8* 9.2   Liver Function Tests:  Recent Labs Lab 06/28/15 0752  AST 26  ALT 24  ALKPHOS 75  BILITOT 0.4  PROT 7.7  ALBUMIN 3.8    Recent Labs Lab 06/28/15 0752  LIPASE 33   No results for input(s): AMMONIA in the last 168 hours. CBC:  Recent Labs Lab 06/28/15 0752 06/29/15 0613  WBC 13.8* 8.2  NEUTROABS 11.0*  --   HGB 14.0 13.3  HCT 42.1 40.3  MCV 93.1 92.9  PLT 268 261   Cardiac Enzymes: No results for input(s): CKTOTAL, CKMB, CKMBINDEX, TROPONINI in the last 168 hours. BNP: BNP (last 3 results) No results for input(s): BNP in the last 8760 hours.  ProBNP (last 3 results) No results for input(s): PROBNP in the last 8760 hours.  CBG: No results for input(s): GLUCAP in the last 168 hours.     Signed:  Birdie Hopes MD  [facility] 06/30/2015, 10:22 AM

## 2015-06-30 NOTE — Care Management Important Message (Signed)
Important Message  Patient Details  Name: Adrienne [name] MRN: [id number] Date of Birth: 1946-02-12   Medicare Important Message Given:  Yes    Joylene Draft, RN 06/30/2015, 10:46 AM

## 2015-06-30 NOTE — Care Management Note (Signed)
Case Management Note  Patient Details  Name: Adrienne [name] MRN: [id number] Date of Birth: [birth year]  Subjective/Objective:                    Action/Plan:   Expected Discharge Date:                  Expected Discharge Plan:  Home/Self Care  In-House Referral:  NA  Discharge planning Services  CM Consult  Post Acute Care Choice:  NA Choice offered to:  NA  DME Arranged:    DME Agency:     HH Arranged:    [place] Agency:     Status of Service:  Completed, signed off  Medicare Important Message Given:    Date Medicare IM Given:    Medicare IM give by:    Date Additional Medicare IM Given:    Additional Medicare Important Message give by:     If discussed at [place] of Stay Meetings, dates discussed:    Additional Comments: Pt discharged home today. No CM needs noted. Christinia Gully [place], RN 06/30/2015, 10:46 AM

## 2015-06-30 NOTE — Plan of Care (Signed)
Problem: Bowel/Gastric: Goal: Will not experience complications related to bowel motility Outcome: Completed/Met Date Met:  06/30/15 Using miralax

## 2015-07-05 ENCOUNTER — Telehealth: Payer: Self-pay | Admitting: *Deleted

## 2015-07-05 NOTE — Telephone Encounter (Signed)
Received fax from [facility] stating pt has been discharged from Hospital   Date of admit:06/28/15  Date of discharge: 06/30/15  Level of [facility]  Attending physician: Birdie Hopes MD  PCP: Susy Frizzle MD  Discharge Dispostion: Discharged to [facility] care (routine discharge)  DX:: [id number]- other intestinal obstruction  F/U instructions: Follow up with Karis Juba, PA-C

## 2015-08-25 ENCOUNTER — Encounter: Payer: Self-pay | Admitting: Cardiovascular Disease

## 2015-08-25 ENCOUNTER — Ambulatory Visit (INDEPENDENT_AMBULATORY_CARE_PROVIDER_SITE_OTHER): Payer: PPO | Admitting: Cardiovascular Disease

## 2015-08-25 VITALS — BP 130/82 | HR 60 | Ht 64.0 in | Wt 168.8 lb

## 2015-08-25 DIAGNOSIS — I5022 Chronic systolic (congestive) heart failure: Secondary | ICD-10-CM

## 2015-08-25 DIAGNOSIS — E785 Hyperlipidemia, unspecified: Secondary | ICD-10-CM | POA: Diagnosis not present

## 2015-08-25 DIAGNOSIS — I10 Essential (primary) hypertension: Secondary | ICD-10-CM | POA: Diagnosis not present

## 2015-08-25 NOTE — Patient Instructions (Signed)
Medication Instructions:  Your physician recommends that you continue on your current medications as directed. Please refer to the Current Medication list given to you today.  Labwork: -None  Testing/Procedures: -None  Follow-Up: Your physician wants you to follow-up in: 1 year follow up with Dr. Acie Fredrickson.  You will receive a reminder letter in the mail two months in advance. If you don't receive a letter, please call our office to schedule the follow-up appointment.   Any Other Special Instructions Will Be Listed Below (If Applicable).     If you need a refill on your cardiac medications before your next appointment, please call your pharmacy.

## 2015-08-25 NOTE — Progress Notes (Signed)
Cardiology Office Note   Date:  08/25/2015   ID:  [name], [name] 07/10/1945, MRN [id number]  PCP:  Karis Juba, PA-C  Cardiologist:   Thayer Headings, MD   Chief Complaint  Patient presents with  . Follow-up    CHF    Problem list: 1. Chronic systolic congestive heart failure-EF of around 44% in 2010.  Her EF has now normalized.  2. Hypertension 3. Dyslipidemia   Adrienne [name] is a middle-age female with a history of hypertension, dyslipidemia, palpitations. She also has a history of chest pain. A nuclear stress test from March of 2010 was negative for ischemia.  She has not had episodes of chest pain or shortness of breath.  Jan. 5, 2015:  Adrienne [name] is seen today after a ~ 3 year absence. She retired last year and tried to get life insurance. She was told that she has "CHF" . Her echo and myoview from 2010 showed mildly reduced EF . The echo showed EF of 45-50%. Myoview EF was 41%.   Her medication doses are slightly lower than when we last saw her 3 years ago. She has gradually reduced her dose because of chronic headaches. She has been seeing a Karis Juba, NP at [facility].  She still smokes - 1/2 ppd currently. She avoids salt. Does not eat out much.   December 31, 2013:  Adrienne [name] is doing ok. BP is much better than previous visit. BP at her medical doctors office . She stopped taking the HCTZ and potassium when she ran out of her prescription. Her blood pressure is better despite the fact that she's not on those medications, . She is planning on moving to [facility] within the next several months   August 25, 2014:    Adrienne [name] is a 70 y.o. female who presents for follow-up of her history of congestive heart failure, hyperlipidemia, and hypertension.  She is staying busy. No CP or dyspnea.  Takes all of her meds. No CP or dyspnea. Does not exercise   still smoking ,  Advised her to stop again      Past Medical History  Diagnosis  Date  . Hypertension   . Thyroid disease   . Hyperlipidemia   . CHF (congestive heart failure) (HCC)     EF 45-50%  . Smoker     Past Surgical History  Procedure Laterality Date  . Vaginal hysterectomy      1986  . Back surgery    . Cholecystectomy    . Right knee meniscus    . Polypectomy-oropharynx    . Abdominal hysterectomy       Current Outpatient Prescriptions  Medication Sig Dispense Refill  . amLODipine (NORVASC) 5 MG tablet TAKE 1 TABLET (5 MG TOTAL) BY MOUTH DAILY. 90 tablet 1  . aspirin 81 MG tablet Take 81 mg by mouth daily.    . carvedilol (COREG) 3.125 MG tablet Take 3.125 mg by mouth daily.  1  . levothyroxine (SYNTHROID, LEVOTHROID) 75 MCG tablet TAKE 1 TABLET (75 MCG TOTAL) BY MOUTH DAILY. 90 tablet 1  . lisinopril (PRINIVIL,ZESTRIL) 20 MG tablet Take 0.5 tablets (10 mg total) by mouth daily. 30 tablet 2  . Multiple Vitamins-Minerals (MULTIVITAMIN WITH MINERALS) tablet Take 1 tablet by mouth daily.    . Omega-3 Fatty Acids (FISH OIL PO) Take 1 tablet by mouth daily.     No current facility-administered medications for this visit.    Allergies:   Review  of patient's allergies indicates no known allergies.    Social History:  The patient  reports that she has been smoking Cigarettes.  She has been smoking about 0.75 packs per day for the past 0 years. She has never used smokeless tobacco. She reports that she does not drink alcohol or use illicit drugs.   Family History:  The patient's family history includes Cancer in her sister; Diabetes in her brother and brother; Hypertension in her other.    ROS:  Please see the history of present illness.    Review of Systems: Constitutional:  denies fever, chills, diaphoresis, appetite change and fatigue.  HEENT: denies photophobia, eye pain, redness, hearing loss, ear pain, congestion, sore throat, rhinorrhea, sneezing, neck pain, neck stiffness and tinnitus.  Respiratory: denies SOB, DOE, cough, chest tightness,  and wheezing.  Cardiovascular: denies chest pain, palpitations and leg swelling.  Gastrointestinal: denies nausea, vomiting, abdominal pain, diarrhea, constipation, blood in stool.  Genitourinary: denies dysuria, urgency, frequency, hematuria, flank pain and difficulty urinating.  Musculoskeletal: denies  myalgias, back pain, joint swelling, arthralgias and gait problem.   Skin: denies pallor, rash and wound.  Neurological: denies dizziness, seizures, syncope, weakness, light-headedness, numbness and headaches.   Hematological: denies adenopathy, easy bruising, personal or family bleeding history.  Psychiatric/ Behavioral: denies suicidal ideation, mood changes, confusion, nervousness, sleep disturbance and agitation.       All other systems are reviewed and negative.    PHYSICAL EXAM: VS:  BP 130/82 mmHg  Pulse 60  Ht 5\' 4"  (1.626 m)  Wt 168 lb 12.8 oz (76.567 kg)  BMI 28.96 kg/m2 , BMI Body mass index is 28.96 kg/(m^2). GEN: Well nourished, well developed, in no acute distress HEENT: normal Neck: no JVD, carotid bruits, or masses Cardiac: RRR; no murmurs, rubs, or gallops,no edema  Respiratory:  clear to auscultation bilaterally, normal work of breathing GI: soft, nontender, nondistended, + BS MS: no deformity or atrophy Skin: warm and dry, no rash Neuro:  Strength and sensation are intact Psych: normal   EKG:  EKG is ordered today. The ekg ordered today demonstrates sinus bradycardia at 53. , LVH with repol abn.    Recent Labs: 06/28/2015: ALT 24; TSH 2.534 06/29/2015: Hemoglobin 13.3; Platelets 261 06/30/2015: BUN 8; Creatinine, Ser 0.66; Potassium 3.8; Sodium 142    Lipid Panel    Component Value Date/Time   CHOL 252* 06/21/2015 0931   TRIG 138 06/21/2015 0931   HDL 54 06/21/2015 0931   CHOLHDL 4.7 06/21/2015 0931   VLDL 28 06/21/2015 0931   LDLCALC 170* 06/21/2015 0931      Wt Readings from Last 3 Encounters:  08/25/15 168 lb 12.8 oz (76.567 kg)  06/28/15 170  lb (77.111 kg)  06/21/15 170 lb (77.111 kg)      Other studies Reviewed: Additional studies/ records that were reviewed today include: . Review of the above records demonstrates:    ASSESSMENT AND PLAN: 1. Chronic systolic congestive heart failure-EF of around 44% in 2010.  Her EF has now normalized.  EF = 55-60%    - Continue same medications.  2. Hypertension - her blood pressures well-controlled. Continue current medications. I have encouraged her to exercise on a regular basis.  3. Dyslipidemia - she's currently on atorvastatin. Her lipids are managed by her primary medical doctor. I'll see her in one year for follow-up.   Current medicines are reviewed at length with the patient today.  The patient does not have concerns regarding medicines.  The following  changes have been made:  no change   Disposition:   FU with me in 1 year.     Signed, Maegen Wigle, Wonda Cheng, MD  08/25/2015 9:24 AM    [facility] [place], [place], [place]  [address] Phone: [phone]; Fax: [phone]

## 2015-09-03 ENCOUNTER — Other Ambulatory Visit: Payer: Self-pay | Admitting: Cardiovascular Disease

## 2015-09-28 ENCOUNTER — Encounter: Payer: Self-pay | Admitting: Family Medicine

## 2015-09-28 ENCOUNTER — Ambulatory Visit (INDEPENDENT_AMBULATORY_CARE_PROVIDER_SITE_OTHER): Payer: PPO | Admitting: Family Medicine

## 2015-09-28 VITALS — BP 122/76 | HR 60 | Temp 98.1°F | Resp 14 | Wt 168.0 lb

## 2015-09-28 DIAGNOSIS — M25562 Pain in left knee: Secondary | ICD-10-CM

## 2015-09-28 NOTE — Progress Notes (Signed)
Subjective:    Patient ID: Adrienne [name], female    DOB: [birth year], 70 y.o.   MRN: [id number]  HPI  Patient for several months of left knee pain. She reports pain and swelling after she walks or stands a long period of time. She reports pain going up and down steps. She denies any locking. She does report some mild joint laxity but her knee has not given out on her. She denies any falls or injuries. She saw her orthopedic physician in the past  1-2 years who did an x-ray and found arthritis Past Medical History  Diagnosis Date  . Hypertension   . Thyroid disease   . Hyperlipidemia   . CHF (congestive heart failure) (HCC)     EF 45-50%  . Smoker    Past Surgical History  Procedure Laterality Date  . Vaginal hysterectomy      1986  . Back surgery    . Cholecystectomy    . Right knee meniscus    . Polypectomy-oropharynx    . Abdominal hysterectomy     Current Outpatient Prescriptions on File Prior to Visit  Medication Sig Dispense Refill  . amLODipine (NORVASC) 5 MG tablet TAKE 1 TABLET (5 MG TOTAL) BY MOUTH DAILY. 90 tablet 1  . aspirin 81 MG tablet Take 81 mg by mouth daily.    . carvedilol (COREG) 3.125 MG tablet Take 3.125 mg by mouth daily.  1  . levothyroxine (SYNTHROID, LEVOTHROID) 75 MCG tablet TAKE 1 TABLET (75 MCG TOTAL) BY MOUTH DAILY. 90 tablet 1  . lisinopril (PRINIVIL,ZESTRIL) 20 MG tablet TAKE ONE-HALF TABLET BY MOUTH ONCE DAILY 15 tablet 11  . Multiple Vitamins-Minerals (MULTIVITAMIN WITH MINERALS) tablet Take 1 tablet by mouth daily.    . Omega-3 Fatty Acids (FISH OIL PO) Take 1 tablet by mouth daily.     No current facility-administered medications on file prior to visit.   No Known Allergies Social History   Social History  . Marital Status: Widowed    Spouse Name: N/A  . Number of Children: N/A  . Years of Education: N/A   Occupational History  . Not on file.   Social History Main Topics  . Smoking status: Current Every Day Smoker -- 0.75  packs/day for 0 years    Types: Cigarettes  . Smokeless tobacco: Never Used     Comment: Smoker since 1975 has quit and started back several times!!  . Alcohol Use: No  . Drug Use: No  . Sexual Activity: Not Currently    Birth Control/ Protection: Surgical   Other Topics Concern  . Not on file   Social History Narrative     Review of Systems  All other systems reviewed and are negative.      Objective:   Physical Exam  Cardiovascular: Normal rate, regular rhythm and normal heart sounds.   Pulmonary/Chest: Effort normal and breath sounds normal.  Musculoskeletal:       Left knee: She exhibits decreased range of motion. She exhibits no swelling, no effusion, no LCL laxity, normal meniscus and no MCL laxity. Tenderness found. Medial joint line and lateral joint line tenderness noted. No MCL and no LCL tenderness noted.  Vitals reviewed.         Assessment & Plan:  The patient has left knee pain and based on her exam I believe it is due to osteoarthritis. I offered the patient NSAIDs but she requests a cortisone injection. Using sterile technique, I injected the left knee  with 2 mL of lidocaine, 2 mL of Marcaine, and 2 mL of 40 mg per mL Kenalog. Recheck if no better in 2 weeks.

## 2015-09-30 ENCOUNTER — Other Ambulatory Visit: Payer: Self-pay | Admitting: Physician Assistant

## 2015-10-02 NOTE — Telephone Encounter (Signed)
Medication refilled per protocol. 

## 2015-11-17 ENCOUNTER — Other Ambulatory Visit: Payer: Self-pay

## 2015-11-17 DIAGNOSIS — Z1231 Encounter for screening mammogram for malignant neoplasm of breast: Secondary | ICD-10-CM

## 2015-12-05 ENCOUNTER — Encounter: Payer: Self-pay | Admitting: Family Medicine

## 2015-12-06 ENCOUNTER — Encounter: Payer: Self-pay | Admitting: Physician Assistant

## 2015-12-06 ENCOUNTER — Ambulatory Visit (INDEPENDENT_AMBULATORY_CARE_PROVIDER_SITE_OTHER): Payer: PPO | Admitting: Physician Assistant

## 2015-12-06 VITALS — BP 132/78 | HR 52 | Temp 98.1°F | Resp 18 | Wt 166.0 lb

## 2015-12-06 DIAGNOSIS — E039 Hypothyroidism, unspecified: Secondary | ICD-10-CM | POA: Diagnosis not present

## 2015-12-06 DIAGNOSIS — I10 Essential (primary) hypertension: Secondary | ICD-10-CM | POA: Diagnosis not present

## 2015-12-06 DIAGNOSIS — I5022 Chronic systolic (congestive) heart failure: Secondary | ICD-10-CM

## 2015-12-06 DIAGNOSIS — E785 Hyperlipidemia, unspecified: Secondary | ICD-10-CM | POA: Diagnosis not present

## 2015-12-06 DIAGNOSIS — Z720 Tobacco use: Secondary | ICD-10-CM

## 2015-12-06 DIAGNOSIS — F17200 Nicotine dependence, unspecified, uncomplicated: Secondary | ICD-10-CM

## 2015-12-06 LAB — TSH: TSH: 3.05 mIU/L

## 2015-12-06 MED ORDER — ATORVASTATIN CALCIUM 80 MG PO TABS: 80.0000 mg | ORAL_TABLET | Freq: Every day | ORAL | Status: DC

## 2015-12-06 NOTE — Progress Notes (Signed)
Patient ID: HENESSEY VANBUREN MRN: [id number], DOB: 1945-10-09, 70 y.o. Date of Encounter: @DATE @  Chief Complaint:  Chief Complaint  Patient presents with  . check up    is fasting    HPI: 70 y.o. year old AA female  presents for routine f/u of hypertension, hyperlipidemia, hypothyroidism.  At [place] 12/02/2013-- The nurse today made me aware of the fact that patient has not been taking medications as directed by the cardiologist and has not followed up with a cardiologist as directed. I discussed this with the patient again. She says that she was given a 90 day supply of HCTZ and potassium. Initially, she says that she was given this 90 day supply and then she had followup tests and they were normal so she felt that she no longer needed the medicines after the 90s they supply was over. Then I explained to her that she was given these medications because her blood pressure was elevated at that office visit and that regardless of the test results she needed to be taking these medicines. Then she tells me that she is "taking enough pills".  She says that she" wants to see him (the cardiologist) again before she starts it." Told her that her blood pressure is high again today and that she needs the medicine. Then she says is high today because there has been stress going on at home. Then I told her that it was also high at the cardiologist's last appointment. At the end of the conversation she refused to restart the medicines at this time and promises that she will followup with Dr. Acie Fredrickson.  At [place] 05/2014-- She says she did see Dr. Duanne Limerick she IS taking all meds that are on med list.   At [place] 12/14/2014: She says that she is taking her thyroid medicine daily. says she is taking her cholesterol medicine daily. Having no myalgias or other adverse effects.  She says that she has having problems with pain in her left shoulder and her right knee. She had office visit here with Dr. Dennard Schaumann  06/23/14. At that time, he did a cortisone injection. Ordered follow-up MRI. MRI performed 07/19/14. Showed supraspinatus and infraspinatus tendinopathy without tear. Moderate acromioclavicular osteoarthritis. She says that she has seen Dr. Marlou Sa in the past for her knee and would like to follow-up with Dr. Marlou Sa at orthopedics.  At [place] 06/21/15: Says that she did see Dr. Marlou Sa and he told her that she had arthritis in her right knee and that down the road, in years, that she would need knee replacement. Says that she is not interested in having surgery. Says that she is considering going to [facility]--- says that she keeps seeing advertisements for this and is wondering if they may have a treatment that would help. In the meantime I told her I would prescribe her some Celebrex to take on a daily basis and see if this helps.  She states that she is taking all of her blood pressure medicines as directed. Taking her thyroid medicine. Taking her Lipitor even though she does cut this in half for 40 mg dose rather than 80 mg.   OV 68/14/2017: She IS taking BP meds as directed. No adv effects.  She IS taking Lipitor--1/2 of 80mg  daily (40mg ) She IS taking thyroid med daily.  She did see Cardiology for routine f/u---I reviewed that note--dated 08/25/2015-- no med changes--f/u 1 year. She IS compliant with all meds at this time!!!! She has no complaints or  concerns today.     Past Medical History  Diagnosis Date  . Hypertension   . Thyroid disease   . Hyperlipidemia   . CHF (congestive heart failure) (HCC)     EF 45-50%  . Smoker      Home Meds: Outpatient Prescriptions Prior to Visit  Medication Sig Dispense Refill  . amLODipine (NORVASC) 5 MG tablet TAKE 1 TABLET (5 MG TOTAL) BY MOUTH DAILY. 90 tablet 1  . aspirin 81 MG tablet Take 81 mg by mouth daily.    . carvedilol (COREG) 3.125 MG tablet Take 3.125 mg by mouth daily.  1  . levothyroxine (SYNTHROID, LEVOTHROID) 75 MCG tablet TAKE 1  TABLET (75 MCG TOTAL) BY MOUTH DAILY. 90 tablet 1  . lisinopril (PRINIVIL,ZESTRIL) 20 MG tablet TAKE ONE-HALF TABLET BY MOUTH ONCE DAILY 15 tablet 11  . Multiple Vitamins-Minerals (MULTIVITAMIN WITH MINERALS) tablet Take 1 tablet by mouth daily.    . Omega-3 Fatty Acids (FISH OIL PO) Take 1 tablet by mouth daily.    . carvedilol (COREG) 3.125 MG tablet TAKE 1 TABLET BY MOUTH TWICE A DAY WITH A MEAL 180 tablet 0   No facility-administered medications prior to visit.    Allergies: No Known Allergies  Social History   Social History  . Marital Status: Widowed    Spouse Name: N/A  . Number of Children: N/A  . Years of Education: N/A   Occupational History  . Not on file.   Social History Main Topics  . Smoking status: Current Every Day Smoker -- 0.75 packs/day for 0 years    Types: Cigarettes  . Smokeless tobacco: Never Used     Comment: Smoker since 1975 has quit and started back several times!!  . Alcohol Use: No  . Drug Use: No  . Sexual Activity: Not Currently    Birth Control/ Protection: Surgical   Other Topics Concern  . Not on file   Social History Narrative    Family History  Problem Relation Age of Onset  . Cancer Sister   . Diabetes Brother   . Hypertension Other   . Diabetes Brother      Review of Systems:  See HPI for pertinent ROS. All other ROS negative.    Physical Exam: Blood pressure 132/78, pulse 52, temperature 98.1 F (36.7 C), temperature source Oral, resp. rate 18, weight 166 lb (75.297 kg)., Body mass index is 28.48 kg/(m^2). General: WNWD AAF. Appears in no acute distress. Neck: Supple. No thyromegaly. No lymphadenopathy. No carotid bruit. Lungs: Clear bilaterally to auscultation without wheezes, rales, or rhonchi. Breathing is unlabored. Heart: RRR with S1 S2. No murmurs, rubs, or gallops. Abdomen: Soft, non-tender, non-distended with normoactive bowel sounds. No hepatomegaly. No rebound/guarding. No obvious abdominal  masses. Musculoskeletal:  Strength and tone normal for age. Extremities/Skin: Warm and dry.  No LE edema.  Neuro: Alert and oriented X 3. Moves all extremities spontaneously. Gait is normal. CNII-XII grossly in tact. Psych:  Responds to questions appropriately with a normal affect.     ASSESSMENT AND PLAN:  70 y.o. year old female with  1. Hypertension BP at goal/controlled. Cont current meds. Check labs to monitor.  - COMPLETE METABOLIC PANEL WITH GFR  2. Dyslipidemia She takes one half of the Lipitor 80 mg daily to equal 40mg .  - COMPLETE METABOLIC PANEL WITH GFR - Lipid panel  3. Hypothyroidism She takes thyroid supplement. Check lab to monitor. - TSH  4. Smoker Cardiology and I have discussed need for cessation  but she is not motivated to quit.  She is aware of medical consequences of continuing to smoke.   5. MEDICATION NONCOMPLIANCE -- SEE HPI REGARDING BP MEDS IN PAST. SEE #4 BELOW REGARDING CHOLESTEROL MEDS IN PAST !!!  I COPIED BELOW INFORMATION FROM MY OV NOTE DATED 05/27/2013: hyperlipidemia: In the past she had extremely high cholesterol with LDL 201. However she was repeatedly noncompliant with medications. At her last visit with me June 2014 I asked her why she was repeatedly noncompliant with these medicines. She said "I don't want to take another pill. If I start a pill I will have to take it the rest of my Life." at that time I discussed the risk of MI stroke death versus taking a pill. She was agreeable to take the medication.  SHe had repeat lipid panel done at that visit in June 2014. LDL was 208. I prescribed Lipitor 80 mg. She says that when she took that it caused a significant headache. She cut the pill in half.  She is taking Lipitor 40 mg daily with no adverse effects. Dyslipidemia  See history of present illness. Lipitor 80 mg caused headache. Patiently currently taking Lipitor 40 mg and tolerates this well  - COMPLETE METABOLIC PANEL WITH GFR  - Lipid  panel   6. Congestive heart failure  At her last visit with me 11/25/2012 I did not have any cardiac history documented in her paper chart but then I saw this diagnosis in Epic. I Then read notes in Epic and discussed with patient.  She saw Dr. Acie Fredrickson in past secondary to palpitations. Also had chest pain and negative nuclear stress test March 2010. She knows nothing about the CHF and I do not see any actual test reports reg CHF but do see this in Dr. Elmarie Shiley office visit note.  She has no CHF symptoms or findings on exam. She is on Coreg and ACE inhibitor.  At 05/2013 office visit I told her to schedule followup with Dr. Acie Fredrickson.  The last office visit note in Epic with him was 10/04/10.  At 11/2013 OV she said that she forgot all about initial and followup with Dr. Acie Fredrickson.  At 11/2013 I scheduled referral. She was agreeable to followup.  At [place] 05/2014 she says she did f/u with Dr. Acie Fredrickson and she is taking meds as directed.  At visit 05/2015 I reviewed her last visit with Dr. Acie Fredrickson was 08/25/14. When I printed her AVS at today's visit I do not see that she has any appointment scheduled with him/cardiology. Told her to make sure that she does follow-up with him. She did have f/u Ov with Dr. Acie Fredrickson 08/25/2015--- No changes--f/u 1 year.  #7 She sees gynecology for her breast exam, mammogram, pelvic exam Pap smear.   8. Colonoscopy: She states that she had one at age 58.  Says she had a second one at age 33. This one was also normal and was to repeat 10 years.  Says it was with [facility]  #8 immunizations:  Tetanus: Given here 05/2013 Pneumonia vaccine: Prevnar 13 --given here 05/2013.  Pneumovax 23. --given here 05/2014 Zostavax: She says that she had this in the past with Dr. Joaquim Lai.  Influenza vaccine: Discussed at visit 05/2015 but she refuses/defers. Aware of risk versus benefit.  Osteoarthritis Right Knee Has been evaluated by Dr. Marlou Sa orthopedics. She is interested in going to [facility]  to see if they have any other treatment options. At visit 06/21/15 I prescribed Celebrex 200  mg 1 by mouth daily. Reviewed that her renal function has been normal.    F/U for Routine OV 6 months. F/U sooner if needed.   Signed, [address] [place], [place], [facility] 12/06/2015 10:45 AM

## 2015-12-07 LAB — LIPID PANEL
CHOL/HDL RATIO: 4.1 ratio (ref <=5.0)
CHOLESTEROL: 292 mg/dL — AB (ref 125–200)
HDL: 71 mg/dL (ref >=46)
LDL CALC: 195 mg/dL — AB (ref <130)
TRIGLYCERIDES: 132 mg/dL (ref <150)
VLDL: 26 mg/dL (ref <30)

## 2015-12-07 LAB — COMPLETE METABOLIC PANEL WITH GFR
ALT: 13 U/L (ref 6–29)
AST: 19 U/L (ref 10–35)
Albumin: 3.8 g/dL (ref 3.6–5.1)
Alkaline Phosphatase: 74 U/L (ref 33–130)
BUN: 11 mg/dL (ref 7–25)
CALCIUM: 8.4 mg/dL — AB (ref 8.6–10.4)
CHLORIDE: 107 mmol/L (ref 98–110)
CO2: 17 mmol/L — AB (ref 20–31)
Creat: 0.81 mg/dL (ref 0.50–0.99)
GFR, EST AFRICAN AMERICAN: 86 mL/min (ref >=60)
GFR, Est Non African American: 75 mL/min (ref >=60)
Glucose, Bld: 93 mg/dL (ref 70–99)
POTASSIUM: 4.1 mmol/L (ref 3.5–5.3)
Sodium: 145 mmol/L (ref 135–146)
Total Bilirubin: 0.3 mg/dL (ref 0.2–1.2)
Total Protein: 7.1 g/dL (ref 6.1–8.1)

## 2015-12-11 ENCOUNTER — Telehealth: Payer: Self-pay | Admitting: Physician Assistant

## 2015-12-11 MED ORDER — LEVOTHYROXINE SODIUM 75 MCG PO TABS: ORAL_TABLET | ORAL | Status: DC

## 2015-12-11 NOTE — Telephone Encounter (Addendum)
Pt is on vacation and needs her refill of Levothyroxine sent to the [facility] on [address] in [place], [place] [address]

## 2015-12-11 NOTE — Telephone Encounter (Signed)
1 refill sent

## 2015-12-18 ENCOUNTER — Ambulatory Visit
Admission: RE | Admit: 2015-12-18 | Discharge: 2015-12-18 | Disposition: A | Discharge status: Home / Self Care | Payer: PPO | Source: Ambulatory Visit

## 2015-12-18 ENCOUNTER — Other Ambulatory Visit: Payer: Self-pay | Admitting: Physician Assistant

## 2015-12-18 DIAGNOSIS — Z1231 Encounter for screening mammogram for malignant neoplasm of breast: Secondary | ICD-10-CM

## 2015-12-18 LAB — HM MAMMOGRAPHY

## 2015-12-20 ENCOUNTER — Encounter: Payer: Self-pay | Admitting: Family Medicine

## 2015-12-21 ENCOUNTER — Ambulatory Visit: Payer: Medicare Other | Admitting: Physician Assistant

## 2016-01-11 DIAGNOSIS — H40023 Open angle with borderline findings, high risk, bilateral: Secondary | ICD-10-CM | POA: Diagnosis not present

## 2016-01-11 DIAGNOSIS — H2513 Age-related nuclear cataract, bilateral: Secondary | ICD-10-CM | POA: Diagnosis not present

## 2016-01-11 DIAGNOSIS — H25013 Cortical age-related cataract, bilateral: Secondary | ICD-10-CM | POA: Diagnosis not present

## 2016-01-11 DIAGNOSIS — H04123 Dry eye syndrome of bilateral lacrimal glands: Secondary | ICD-10-CM | POA: Diagnosis not present

## 2016-01-17 ENCOUNTER — Ambulatory Visit (INDEPENDENT_AMBULATORY_CARE_PROVIDER_SITE_OTHER): Payer: PPO | Admitting: Obstetrics and Gynecology

## 2016-01-17 ENCOUNTER — Encounter: Payer: Self-pay | Admitting: Obstetrics and Gynecology

## 2016-01-17 DIAGNOSIS — Z90710 Acquired absence of both cervix and uterus: Secondary | ICD-10-CM | POA: Diagnosis not present

## 2016-01-17 DIAGNOSIS — Z01419 Encounter for gynecological examination (general) (routine) without abnormal findings: Secondary | ICD-10-CM

## 2016-01-17 DIAGNOSIS — N904 Leukoplakia of vulva: Secondary | ICD-10-CM | POA: Diagnosis not present

## 2016-01-17 NOTE — Progress Notes (Signed)
Patient ID: Adrienne [name], female   DOB: [birth year], 70 y.o.   MRN: [id number]   Assessment:  Annual Gyn Exam S/p abdominal hysterectomy  Chronic moisture changes consistent with early vulvar dystrophy vs yeast   Plan:  1. pap smear not done s/p hysterectomy  2. return in 2 years or prn 3    Annual mammogram and regular self breast exams advised 4. Rx Mycolog  5. Advised pt to obtain Hemoccult cards from PCP   Subjective:  Adrienne [name] is a 70 y.o. female [id number] who presents for annual exam. No LMP recorded. Patient has had a hysterectomy. The patient has complaints today of mild vulvar irritation.   The following portions of the patient's history were reviewed and updated as appropriate: allergies, current medications, past family history, past medical history, past social history, past surgical history and problem list. Past Medical History:  Diagnosis Date  . CHF (congestive heart failure) (HCC)    EF 45-50%  . Hyperlipidemia   . Hypertension   . Smoker   . Thyroid disease     Past Surgical History:  Procedure Laterality Date  . ABDOMINAL HYSTERECTOMY    . BACK SURGERY    . CHOLECYSTECTOMY    . polypectomy-oropharynx    . right knee meniscus    . VAGINAL HYSTERECTOMY     1986     Current Outpatient Prescriptions:  .  amLODipine (NORVASC) 5 MG tablet, TAKE 1 TABLET (5 MG TOTAL) BY MOUTH DAILY., Disp: 90 tablet, Rfl: 1 .  aspirin 81 MG tablet, Take 81 mg by mouth daily., Disp: , Rfl:  .  atorvastatin (LIPITOR) 80 MG tablet, Take 1 tablet (80 mg total) by mouth daily., Disp: 90 tablet, Rfl: 3 .  carvedilol (COREG) 3.125 MG tablet, Take 3.125 mg by mouth daily., Disp: , Rfl: 1 .  levothyroxine (SYNTHROID, LEVOTHROID) 75 MCG tablet, TAKE 1 TABLET (75 MCG TOTAL) BY MOUTH DAILY., Disp: 90 tablet, Rfl: 0 .  lisinopril (PRINIVIL,ZESTRIL) 20 MG tablet, TAKE ONE-HALF TABLET BY MOUTH ONCE DAILY, Disp: 15 tablet, Rfl: 11 .  Multiple Vitamins-Minerals (MULTIVITAMIN WITH  MINERALS) tablet, Take 1 tablet by mouth daily., Disp: , Rfl:  .  Omega-3 Fatty Acids (FISH OIL PO), Take 1 tablet by mouth daily., Disp: , Rfl:   Review of Systems Constitutional: negative Gastrointestinal: negative Genitourinary: mild vulvar irritation   Objective:  BP (!) 160/90 (BP Location: Left Arm, Patient Position: Sitting, Cuff Size: Normal)   Ht 5\' 3"  (1.6 m)   Wt 163 lb (73.9 kg)   BMI 28.87 kg/m    BMI: Body mass index is 28.87 kg/m.  General Appearance: Alert, appropriate appearance for age. No acute distress HEENT: Grossly normal Neck / Thyroid:  Cardiovascular: RRR; normal S1, S2, no murmur Lungs: CTA bilaterally Back: No CVAT Breast Exam: No masses or nodes.No dimpling, nipple retraction or discharge. Gastrointestinal: Soft, non-tender, no masses or organomegaly Pelvic Exam:  External genitalia: chronic skin changes consistent with early vulvar dystrophy vs yeast  Vaginal: normal mucosa without prolapse or lesions; good anterior and posterior support, normal discharge, normal secretions Cervix- absent, removed surgically  Uterus- absent, removed surgically , cuff well supported Adnexa- absent, removed surgically   Rectovaginal: not indicated Lymphatic Exam: Non-palpable nodes in neck, clavicular, axillary, or inguinal regions  Skin: no rash or abnormalities comedone at right breast evacuated. Neurologic: Normal gait and speech, no tremor  Psychiatric: Alert and oriented, appropriate affect.  Urinalysis:Not done  Mallory Shirk. MD Pgr [phone] 9:49  AM    By signing my name below, I, Hansel Feinstein, attest that this documentation has been prepared under the direction and in the presence of Jonnie Kind, MD. Electronically Signed: Hansel Feinstein, ED Scribe. 01/17/16. 9:49 AM.  I personally performed the services described in this documentation, which was SCRIBED in my presence. The recorded information has been reviewed and considered accurate. It has been  edited as necessary during review. Jonnie Kind, MD

## 2016-01-24 ENCOUNTER — Other Ambulatory Visit: Payer: Self-pay | Admitting: Physician Assistant

## 2016-01-25 ENCOUNTER — Encounter: Payer: Self-pay | Admitting: Family Medicine

## 2016-01-25 DIAGNOSIS — H409 Unspecified glaucoma: Secondary | ICD-10-CM | POA: Insufficient documentation

## 2016-01-25 DIAGNOSIS — H269 Unspecified cataract: Secondary | ICD-10-CM | POA: Insufficient documentation

## 2016-01-25 NOTE — Telephone Encounter (Signed)
Medication refilled per protocol. 

## 2016-03-17 ENCOUNTER — Other Ambulatory Visit: Payer: Self-pay | Admitting: Physician Assistant

## 2016-03-20 ENCOUNTER — Telehealth: Payer: Self-pay

## 2016-03-20 DIAGNOSIS — T783XXA Angioneurotic edema, initial encounter: Secondary | ICD-10-CM | POA: Diagnosis not present

## 2016-03-20 NOTE — Telephone Encounter (Signed)
1--- Remove lisinopril from medication list.  2-  Add Lisinopril to "Allergies" List---Reaction--Angioedema--document date 02/2016 3-- Tell pt that since that medicine is being discontinued, in order to keep her BP controlled--increase Norvasc to 10mg  daily.  4- Remove Norvasc 5mg  from med list.  5-Send Norvasc 10mg  1 po QD # 30 + 3 6-Have pt schedule OV with me for 2 weeks.

## 2016-03-20 NOTE — Telephone Encounter (Signed)
Pt states she went to the Urgent Care because her lip was swelling up  and the DR told pt to stop taking Lisinopril. Please advise on what she should do

## 2016-03-22 MED ORDER — AMLODIPINE BESYLATE 10 MG PO TABS: 10.0000 mg | ORAL_TABLET | Freq: Every day | ORAL | 3 refills | Status: DC

## 2016-03-22 NOTE — Telephone Encounter (Signed)
Contacted pt in regards to dose increase with her B/P medication

## 2016-03-25 NOTE — Telephone Encounter (Signed)
Called pt in regards to follow up appt. Pt stated she just received mess over weekend and that she was at work and  would call bck to sch at a later time

## 2016-04-04 ENCOUNTER — Ambulatory Visit: Payer: PPO | Admitting: Physician Assistant

## 2016-04-08 ENCOUNTER — Ambulatory Visit (INDEPENDENT_AMBULATORY_CARE_PROVIDER_SITE_OTHER): Payer: PPO | Admitting: Physician Assistant

## 2016-04-08 ENCOUNTER — Encounter: Payer: Self-pay | Admitting: Physician Assistant

## 2016-04-08 VITALS — BP 132/70 | HR 55 | Temp 97.8°F | Resp 18 | Wt 167.0 lb

## 2016-04-08 DIAGNOSIS — I10 Essential (primary) hypertension: Secondary | ICD-10-CM | POA: Diagnosis not present

## 2016-04-08 DIAGNOSIS — E785 Hyperlipidemia, unspecified: Secondary | ICD-10-CM

## 2016-04-08 DIAGNOSIS — Z9114 Patient's other noncompliance with medication regimen: Secondary | ICD-10-CM | POA: Diagnosis not present

## 2016-04-08 DIAGNOSIS — E038 Other specified hypothyroidism: Secondary | ICD-10-CM | POA: Diagnosis not present

## 2016-04-08 DIAGNOSIS — E063 Autoimmune thyroiditis: Secondary | ICD-10-CM | POA: Diagnosis not present

## 2016-04-08 DIAGNOSIS — F17200 Nicotine dependence, unspecified, uncomplicated: Secondary | ICD-10-CM

## 2016-04-08 DIAGNOSIS — I5022 Chronic systolic (congestive) heart failure: Secondary | ICD-10-CM

## 2016-04-08 LAB — COMPLETE METABOLIC PANEL WITH GFR
ALBUMIN: 3.8 g/dL (ref 3.6–5.1)
ALK PHOS: 76 U/L (ref 33–130)
ALT: 19 U/L (ref 6–29)
AST: 22 U/L (ref 10–35)
BILIRUBIN TOTAL: 0.6 mg/dL (ref 0.2–1.2)
BUN: 10 mg/dL (ref 7–25)
CO2: 26 mmol/L (ref 20–31)
Calcium: 9.2 mg/dL (ref 8.6–10.4)
Chloride: 106 mmol/L (ref 98–110)
Creat: 0.83 mg/dL (ref 0.50–0.99)
GFR, EST AFRICAN AMERICAN: 84 mL/min (ref >=60)
GFR, EST NON AFRICAN AMERICAN: 73 mL/min (ref >=60)
Glucose, Bld: 101 mg/dL — ABNORMAL HIGH (ref 70–99)
Potassium: 4.1 mmol/L (ref 3.5–5.3)
Sodium: 142 mmol/L (ref 135–146)
TOTAL PROTEIN: 6.9 g/dL (ref 6.1–8.1)

## 2016-04-08 LAB — LIPID PANEL
Cholesterol: 211 mg/dL — ABNORMAL HIGH (ref 125–200)
HDL: 66 mg/dL (ref >=46)
LDL Cholesterol: 125 mg/dL (ref <130)
TRIGLYCERIDES: 102 mg/dL (ref <150)
Total CHOL/HDL Ratio: 3.2 Ratio (ref <=5.0)
VLDL: 20 mg/dL (ref <30)

## 2016-04-08 LAB — TSH: TSH: 1.56 mIU/L

## 2016-04-08 MED ORDER — ROSUVASTATIN CALCIUM 20 MG PO TABS: 20.0000 mg | ORAL_TABLET | Freq: Every day | ORAL | 3 refills | Status: DC

## 2016-04-08 NOTE — Progress Notes (Signed)
Patient ID: Adrienne [name] MRN: [id number], DOB: September 29, 1945, 70 y.o. Date of Encounter: @DATE @  Chief Complaint:  Chief Complaint  Patient presents with  . Follow-up    HPI: 70 y.o. year old AA female  presents for routine f/u of hypertension, hyperlipidemia, hypothyroidism.  At [facility] 12/02/2013-- The nurse today made me aware of the fact that patient has not been taking medications as directed by the cardiologist and has not followed up with a cardiologist as directed. I discussed this with the patient again. She says that she was given a 90 day supply of HCTZ and potassium. Initially, she says that she was given this 90 day supply and then she had followup tests and they were normal so she felt that she no longer needed the medicines after the 90s they supply was over. Then I explained to her that she was given these medications because her blood pressure was elevated at that office visit and that regardless of the test results she needed to be taking these medicines. Then she tells me that she is "taking enough pills".  She says that she" wants to see him (the cardiologist) again before she starts it." Told her that her blood pressure is high again today and that she needs the medicine. Then she says is high today because there has been stress going on at home. Then I told her that it was also high at the cardiologist's last appointment. At the end of the conversation she refused to restart the medicines at this time and promises that she will followup with Dr. Acie Fredrickson.  At [facility] 05/2014-- She says she did see Dr. Duanne Limerick she IS taking all meds that are on med list.   At [facility] 12/14/2014: She says that she is taking her thyroid medicine daily. says she is taking her cholesterol medicine daily. Having no myalgias or other adverse effects.  She says that she has having problems with pain in her left shoulder and her right knee. She had office visit here with Dr. Dennard Schaumann 06/23/14. At  that time, he did a cortisone injection. Ordered follow-up MRI. MRI performed 07/19/14. Showed supraspinatus and infraspinatus tendinopathy without tear. Moderate acromioclavicular osteoarthritis. She says that she has seen Dr. Marlou Sa in the past for her knee and would like to follow-up with Dr. Marlou Sa at orthopedics.  At [facility] 06/21/15: Says that she did see Dr. Marlou Sa and he told her that she had arthritis in her right knee and that down the road, in years, that she would need knee replacement. Says that she is not interested in having surgery. Says that she is considering going to [facility]--- says that she keeps seeing advertisements for this and is wondering if they may have a treatment that would help. In the meantime I told her I would prescribe her some Celebrex to take on a daily basis and see if this helps.  She states that she is taking all of her blood pressure medicines as directed. Taking her thyroid medicine. Taking her Lipitor even though she does cut this in half for 40 mg dose rather than 80 mg.   OV 12/06/2015: She IS taking BP meds as directed. No adv effects.  She IS taking Lipitor--1/2 of 80mg  daily (40mg ) She IS taking thyroid med daily.  She did see Cardiology for routine f/u---I reviewed that note--dated 08/25/2015-- no med changes--f/u 1 year. She IS compliant with all meds at this time!!!! She has no complaints or concerns today.  HOWEVER---when I got  lab results back from that visit 11/2015 her lipids were up--- when we then asked her about this she admitted that she had not been taking her atorvastatin every single day.  04/08/2016: Today, and I initially went through all of the different medicines she shook her head in agreement that she was taking them. However I then saw my lab result note attached to the labs from last visit 12/06/15. Today I discussed this issue about the atorvastatin. She says that her left shoulder was hurting and she was talking to some other friends  and they said that they had similar issues with Lipitor. Told her that they just didn't take the Lipitor every single day. She says that she has been taking the Lipitor every other day or every 2 days. Shoulder doesn't seem to hurt as much when she does this. If she takes the medicine every day, her left shoulder hurts. She states that she is taking the other medicines as directed. She has no complaints or concerns today.   Past Medical History:  Diagnosis Date  . CHF (congestive heart failure) (HCC)    EF 45-50%  . Hyperlipidemia   . Hypertension   . Smoker   . Thyroid disease      Home Meds: Outpatient Medications Prior to Visit  Medication Sig Dispense Refill  . amLODipine (NORVASC) 10 MG tablet Take 1 tablet (10 mg total) by mouth daily. 30 tablet 3  . aspirin 81 MG tablet Take 81 mg by mouth daily.    . carvedilol (COREG) 3.125 MG tablet Take 3.125 mg by mouth daily.  1  . levothyroxine (SYNTHROID, LEVOTHROID) 75 MCG tablet TAKE ONE TABLET BY MOUTH ONCE DAILY 90 tablet 0  . Multiple Vitamins-Minerals (MULTIVITAMIN WITH MINERALS) tablet Take 1 tablet by mouth daily.    . Omega-3 Fatty Acids (FISH OIL PO) Take 1 tablet by mouth daily.    Marland Kitchen atorvastatin (LIPITOR) 80 MG tablet Take 1 tablet (80 mg total) by mouth daily. 90 tablet 3   No facility-administered medications prior to visit.     Allergies:  Allergies  Allergen Reactions  . Lisinopril Swelling    Angioedema    Social History   Social History  . Marital status: Widowed    Spouse name: N/A  . Number of children: N/A  . Years of education: N/A   Occupational History  . Not on file.   Social History Main Topics  . Smoking status: Current Every Day Smoker    Packs/day: 0.75    Years: 0.00    Types: Cigarettes  . Smokeless tobacco: Never Used     Comment: Smoker since 1975 has quit and started back several times!!  . Alcohol use No  . Drug use: No  . Sexual activity: Not Currently    Birth control/  protection: Surgical   Other Topics Concern  . Not on file   Social History Narrative  . No narrative on file    Family History  Problem Relation Age of Onset  . Cancer Sister   . Diabetes Brother   . Diabetes Brother   . Hypertension Other      Review of Systems:  See HPI for pertinent ROS. All other ROS negative.    Physical Exam: Blood pressure 132/70, pulse (!) 55, temperature 97.8 F (36.6 C), temperature source Oral, resp. rate 18, weight 167 lb (75.8 kg), SpO2 98 %., Body mass index is 29.58 kg/m. General: WNWD AAF. Appears in no acute distress. Neck: Supple.  No thyromegaly. No lymphadenopathy. No carotid bruit. Lungs: Clear bilaterally to auscultation without wheezes, rales, or rhonchi. Breathing is unlabored. Heart: RRR with S1 S2. No murmurs, rubs, or gallops. Abdomen: Soft, non-tender, non-distended with normoactive bowel sounds. No hepatomegaly. No rebound/guarding. No obvious abdominal masses. Musculoskeletal:  Strength and tone normal for age. Extremities/Skin: Warm and dry.  No LE edema.  Neuro: Alert and oriented X 3. Moves all extremities spontaneously. Gait is normal. CNII-XII grossly in tact. Psych:  Responds to questions appropriately with a normal affect.     ASSESSMENT AND PLAN:  70 y.o. year old female with  1. Hypertension BP at goal/controlled. Cont current meds. Check labs to monitor.  - COMPLETE METABOLIC PANEL WITH GFR  2. Dyslipidemia See HPI from [facility] note 04/08/2016---at that OV---D/Ced Liptor. Changed to Crestor. Told her that if she thinks this is causing any adverse effects to call and notify us and sit of her adjusting medicines on her own.  - COMPLETE METABOLIC PANEL WITH GFR - Lipid panel  3. Hypothyroidism She takes thyroid supplement. Check lab to monitor. - TSH  4. Smoker Cardiology and I have discussed need for cessation but she is not motivated to quit.  She is aware of medical consequences of continuing to smoke.   5.  MEDICATION NONCOMPLIANCE -- SEE HPI REGARDING BP MEDS IN PAST. SEE NOTE BELOW AND FROM 03/2016 REGARDING CHOLESTEROL MEDS IN PAST !!!  I COPIED BELOW INFORMATION FROM MY OV NOTE DATED 05/27/2013: hyperlipidemia: In the past she had extremely high cholesterol with LDL 201. However she was repeatedly noncompliant with medications. At her last visit with me June 2014 I asked her why she was repeatedly noncompliant with these medicines. She said "I don't want to take another pill. If I start a pill I will have to take it the rest of my Life." at that time I discussed the risk of MI stroke death versus taking a pill. She was agreeable to take the medication.  SHe had repeat lipid panel done at that visit in June 2014. LDL was 208. I prescribed Lipitor 80 mg. She says that when she took that it caused a significant headache. She cut the pill in half.  She is taking Lipitor 40 mg daily with no adverse effects. Dyslipidemia  See history of present illness. Lipitor 80 mg caused headache. Patiently currently taking Lipitor 40 mg and tolerates this well  - COMPLETE METABOLIC PANEL WITH GFR  - Lipid panel   6. Congestive heart failure  At her last visit with me 11/25/2012 I did not have any cardiac history documented in her paper chart but then I saw this diagnosis in Epic. I Then read notes in Epic and discussed with patient.  She saw Dr. Acie Fredrickson in past secondary to palpitations. Also had chest pain and negative nuclear stress test March 2010. She knows nothing about the CHF and I do not see any actual test reports reg CHF but do see this in Dr. Elmarie Shiley office visit note.  She has no CHF symptoms or findings on exam. She is on Coreg and ACE inhibitor.  At 05/2013 office visit I told her to schedule followup with Dr. Acie Fredrickson.  The last office visit note in Epic with him was 10/04/10.  At 11/2013 OV she said that she forgot all about initial and followup with Dr. Acie Fredrickson.  At 11/2013 I scheduled referral. She was  agreeable to followup.  At [facility] 05/2014 she says she did f/u with Dr. Acie Fredrickson and  she is taking meds as directed.  At visit 05/2015 I reviewed her last visit with Dr. Acie Fredrickson was 08/25/14. When I printed her AVS at today's visit I do not see that she has any appointment scheduled with him/cardiology. Told her to make sure that she does follow-up with him. She did have f/u Ov with Dr. Acie Fredrickson 08/25/2015--- No changes--f/u 1 year.  #7 She sees gynecology for her breast exam, mammogram, pelvic exam Pap smear.   8. Colonoscopy: She states that she had one at age 72.  Says she had a second one at age 10. This one was also normal and was to repeat 10 years.  Says it was with [facility]  #8 immunizations:  Tetanus: Given here 05/2013 Pneumonia vaccine: Prevnar 13 --given here 05/2013.  Pneumovax 23. --given here 05/2014 Zostavax: She says that she had this in the past with Dr. Joaquim Lai.  Influenza vaccine: Discussed at visit 05/2015 but she refuses/defers. Aware of risk versus benefit. Discussed again at visit 03/2016 but she again refuses/defers  Osteoarthritis Right Knee Has been evaluated by Dr. Marlou Sa orthopedics. She is interested in going to [facility] to see if they have any other treatment options. At visit 06/21/15 I prescribed Celebrex 200 mg 1 by mouth daily. Reviewed that her renal function has been normal.    F/U for Routine OV 6 months. F/U sooner if needed.   Signed, [address] [place], [place], [facility] 04/08/2016 9:32 AM

## 2016-04-29 ENCOUNTER — Ambulatory Visit (INDEPENDENT_AMBULATORY_CARE_PROVIDER_SITE_OTHER): Payer: PPO | Admitting: Family Medicine

## 2016-04-29 ENCOUNTER — Encounter: Payer: Self-pay | Admitting: Family Medicine

## 2016-04-29 VITALS — BP 172/88 | HR 58 | Temp 98.1°F | Resp 16 | Ht 63.0 in | Wt 169.0 lb

## 2016-04-29 DIAGNOSIS — M25511 Pain in right shoulder: Secondary | ICD-10-CM | POA: Diagnosis not present

## 2016-04-29 NOTE — Progress Notes (Signed)
   Subjective:    Patient ID: Adrienne [name], female    DOB: 1946/02/24, 70 y.o.   MRN: [id number]  HPI 2 weeks ago, the patient fell and tweaked her right shoulder when she fell. She is now having pain with abduction greater than 80. She has no crepitus with range of motion in the shoulder. Passive abduction is limited to 160. She has a positive empty can sign. She has a positive Hawkins sign. Past Medical History:  Diagnosis Date  . CHF (congestive heart failure) (HCC)    EF 45-50%  . Hyperlipidemia   . Hypertension   . Smoker   . Thyroid disease    Past Surgical History:  Procedure Laterality Date  . ABDOMINAL HYSTERECTOMY    . BACK SURGERY    . CHOLECYSTECTOMY    . polypectomy-oropharynx    . right knee meniscus    . VAGINAL HYSTERECTOMY     1986   Current Outpatient Prescriptions on File Prior to Visit  Medication Sig Dispense Refill  . amLODipine (NORVASC) 10 MG tablet Take 1 tablet (10 mg total) by mouth daily. 30 tablet 3  . aspirin 81 MG tablet Take 81 mg by mouth daily.    . carvedilol (COREG) 3.125 MG tablet Take 3.125 mg by mouth daily.  1  . levothyroxine (SYNTHROID, LEVOTHROID) 75 MCG tablet TAKE ONE TABLET BY MOUTH ONCE DAILY 90 tablet 0  . Multiple Vitamins-Minerals (MULTIVITAMIN WITH MINERALS) tablet Take 1 tablet by mouth daily.    . Omega-3 Fatty Acids (FISH OIL PO) Take 1 tablet by mouth daily.    . rosuvastatin (CRESTOR) 20 MG tablet Take 1 tablet (20 mg total) by mouth daily. 90 tablet 3   No current facility-administered medications on file prior to visit.    Allergies  Allergen Reactions  . Lisinopril Swelling    Angioedema   Social History   Social History  . Marital status: Widowed    Spouse name: N/A  . Number of children: N/A  . Years of education: N/A   Occupational History  . Not on file.   Social History Main Topics  . Smoking status: Current Every Day Smoker    Packs/day: 0.75    Years: 0.00    Types: Cigarettes  . Smokeless  tobacco: Never Used     Comment: Smoker since 1975 has quit and started back several times!!  . Alcohol use No  . Drug use: No  . Sexual activity: Not Currently    Birth control/ protection: Surgical   Other Topics Concern  . Not on file   Social History Narrative  . No narrative on file     Review of Systems  All other systems reviewed and are negative.      Objective:   Physical Exam  Cardiovascular: Normal rate, regular rhythm and normal heart sounds.   Pulmonary/Chest: Effort normal and breath sounds normal.  Vitals reviewed.         Assessment & Plan:  Right shoulder pain. I suspect subacromial bursitis and supraspinous tendinitis although I cannot exclude a partial tear in the supraspinatus. Patient elects to proceed with a cortisone injection. Using sterile technique, the right shoulder was injected with 2 mL of lidocaine, 2 mL of Marcaine, and 2 mL of 40 mg per mL Kenalog. She tolerated the procedure well without complication

## 2016-06-10 ENCOUNTER — Ambulatory Visit: Payer: PPO | Admitting: Physician Assistant

## 2016-06-25 ENCOUNTER — Other Ambulatory Visit: Payer: Self-pay | Admitting: Physician Assistant

## 2016-09-12 ENCOUNTER — Encounter: Payer: Self-pay | Admitting: Cardiovascular Disease

## 2016-09-12 ENCOUNTER — Ambulatory Visit (INDEPENDENT_AMBULATORY_CARE_PROVIDER_SITE_OTHER): Payer: PPO | Admitting: Cardiovascular Disease

## 2016-09-12 ENCOUNTER — Encounter (INDEPENDENT_AMBULATORY_CARE_PROVIDER_SITE_OTHER): Payer: Self-pay

## 2016-09-12 VITALS — BP 124/78 | HR 50 | Ht 63.0 in | Wt 170.0 lb

## 2016-09-12 DIAGNOSIS — I10 Essential (primary) hypertension: Secondary | ICD-10-CM | POA: Diagnosis not present

## 2016-09-12 DIAGNOSIS — I5022 Chronic systolic (congestive) heart failure: Secondary | ICD-10-CM | POA: Diagnosis not present

## 2016-09-12 DIAGNOSIS — E782 Mixed hyperlipidemia: Secondary | ICD-10-CM | POA: Diagnosis not present

## 2016-09-12 NOTE — Patient Instructions (Signed)

## 2016-09-12 NOTE — Progress Notes (Signed)
Cardiology Office Note   Date:  09/12/2016   ID:  Adrienne, [name] 1946/06/10, MRN [id number]  PCP:  Karis Juba, PA-C  Cardiologist:   Mertie Moores, MD   Chief Complaint  Patient presents with  . Congestive Heart Failure    Problem list: 1. Chronic systolic congestive heart failure-EF of around 44% in 2010.  Her EF has now normalized.  2. Hypertension 3. Dyslipidemia   Adrienne [name] is a middle-age female with a history of hypertension, dyslipidemia, palpitations. She also has a history of chest pain. A nuclear stress test from March of 2010 was negative for ischemia.  She has not had episodes of chest pain or shortness of breath.  Jan. 5, 2015:  Adrienne [name] is seen today after a ~ 3 year absence. She retired last year and tried to get life insurance. She was told that she has "CHF" . Her echo and myoview from 2010 showed mildly reduced EF . The echo showed EF of 45-50%. Myoview EF was 41%.   Her medication doses are slightly lower than when we last saw her 3 years ago. She has gradually reduced her dose because of chronic headaches. She has been seeing a Karis Juba, NP at [facility].  She still smokes - 1/2 ppd currently. She avoids salt. Does not eat out much.   December 31, 2013:  Adrienne [name] is doing ok. BP is much better than previous visit. BP at her medical doctors office . She stopped taking the HCTZ and potassium when she ran out of her prescription. Her blood pressure is better despite the fact that she's not on those medications, . She is planning on moving to [facility] within the next several months   August 25, 2014:    Adrienne [name] is a 71 y.o. female who presents for follow-up of her history of congestive heart failure, hyperlipidemia, and hypertension.  She is staying busy. No CP or dyspnea.  Takes all of her meds. No CP or dyspnea. Does not exercise   still smoking ,  Advised her to stop again   September 12, 2016:  No CP , no  dyspnea.   Still smoking  - advised her to stop  Having trouble with her knees     Past Medical History:  Diagnosis Date  . CHF (congestive heart failure) (HCC)    EF 45-50%  . Hyperlipidemia   . Hypertension   . Smoker   . Thyroid disease     Past Surgical History:  Procedure Laterality Date  . ABDOMINAL HYSTERECTOMY    . BACK SURGERY    . CHOLECYSTECTOMY    . polypectomy-oropharynx    . right knee meniscus    . VAGINAL HYSTERECTOMY     1986     Current Outpatient Prescriptions  Medication Sig Dispense Refill  . amLODipine (NORVASC) 10 MG tablet Take 1 tablet (10 mg total) by mouth daily. 30 tablet 3  . aspirin 81 MG tablet Take 81 mg by mouth daily.    . carvedilol (COREG) 3.125 MG tablet Take 3.125 mg by mouth daily.  1  . levothyroxine (SYNTHROID, LEVOTHROID) 75 MCG tablet TAKE ONE TABLET BY MOUTH ONCE DAILY 90 tablet 0  . Multiple Vitamins-Minerals (MULTIVITAMIN WITH MINERALS) tablet Take 1 tablet by mouth daily.    . Omega-3 Fatty Acids (FISH OIL PO) Take 1 tablet by mouth daily.    . rosuvastatin (CRESTOR) 20 MG tablet Take 1 tablet (20 mg total) by mouth daily.  90 tablet 3   No current facility-administered medications for this visit.     Allergies:   Lisinopril    Social History:  The patient  reports that she has been smoking Cigarettes.  She has been smoking about 0.75 packs per day for the past 0.00 years. She has never used smokeless tobacco. She reports that she does not drink alcohol or use drugs.   Family History:  The patient's family history includes Cancer in her sister; Diabetes in her brother and brother; Hypertension in her other.    ROS:  Please see the history of present illness.    Review of Systems: Constitutional:  denies fever, chills, diaphoresis, appetite change and fatigue.  HEENT: denies photophobia, eye pain, redness, hearing loss, ear pain, congestion, sore throat, rhinorrhea, sneezing, neck pain, neck stiffness and tinnitus.    Respiratory: denies SOB, DOE, cough, chest tightness, and wheezing.  Cardiovascular: denies chest pain, palpitations and leg swelling.  Gastrointestinal: denies nausea, vomiting, abdominal pain, diarrhea, constipation, blood in stool.  Genitourinary: denies dysuria, urgency, frequency, hematuria, flank pain and difficulty urinating.  Musculoskeletal: denies  myalgias, back pain, joint swelling, arthralgias and gait problem.   Skin: denies pallor, rash and wound.  Neurological: denies dizziness, seizures, syncope, weakness, light-headedness, numbness and headaches.   Hematological: denies adenopathy, easy bruising, personal or family bleeding history.  Psychiatric/ Behavioral: denies suicidal ideation, mood changes, confusion, nervousness, sleep disturbance and agitation.       All other systems are reviewed and negative.    PHYSICAL EXAM: VS:  BP 124/78 (BP Location: Left Arm, Patient Position: Sitting, Cuff Size: Normal)   Pulse (!) 50   Ht 5\' 3"  (1.6 m)   Wt 170 lb (77.1 kg)   SpO2 97%   BMI 30.11 kg/m  , BMI Body mass index is 30.11 kg/m. GEN: Well nourished, well developed, in no acute distress  HEENT: normal  Neck: no JVD, carotid bruits, or masses Cardiac: RRR; no murmurs, rubs, or gallops,no edema  Respiratory:  clear to auscultation bilaterally, normal work of breathing GI: soft, nontender, nondistended, + BS MS: no deformity or atrophy  Skin: warm and dry, no rash Neuro:  Strength and sensation are intact Psych: normal   EKG:  EKG is ordered today. The ekg ordered today demonstrates sinus bradycardia at 50.   LAD , NS ST abn.    Recent Labs: 04/08/2016: ALT 19; BUN 10; Creat 0.83; Potassium 4.1; Sodium 142; TSH 1.56    Lipid Panel    Component Value Date/Time   CHOL 211 (H) 04/08/2016 0933   TRIG 102 04/08/2016 0933   HDL 66 04/08/2016 0933   CHOLHDL 3.2 04/08/2016 0933   VLDL 20 04/08/2016 0933   LDLCALC 125 04/08/2016 0933      Wt Readings from  Last 3 Encounters:  09/12/16 170 lb (77.1 kg)  04/29/16 169 lb (76.7 kg)  04/08/16 167 lb (75.8 kg)      Other studies Reviewed: Additional studies/ records that were reviewed today include: . Review of the above records demonstrates:    ASSESSMENT AND PLAN: 1. Chronic systolic congestive heart failure-EF of around 44% in 2010.  Her EF has now normalized.  EF = 55-60%    - Continue same medications.  2. Hypertension - her blood pressures well-controlled. Continue current medications. I have encouraged her to exercise on a regular basis.  3. Dyslipidemia - She had a reaction to the atorvastatin. She was prescribed rosuvastatin but did not try. Try to encourage  her to father rosuvastatin since the side effects may be less. She'll follow-up with her primary medical doctor.  Current medicines are reviewed at length with the patient today.  The patient does not have concerns regarding medicines.  The following changes have been made:  no change   Disposition:   FU with me in 1 year.     Signed, Mertie Moores, MD  09/12/2016 9:53 AM    [address] [place], [place], [place]  [address] Phone: [phone]; Fax: [phone]

## 2016-10-01 ENCOUNTER — Other Ambulatory Visit: Payer: Self-pay | Admitting: Physician Assistant

## 2016-10-02 NOTE — Telephone Encounter (Signed)
Rx filled per protocol  

## 2016-10-07 ENCOUNTER — Ambulatory Visit: Payer: PPO | Admitting: Physician Assistant

## 2016-10-14 DIAGNOSIS — M9906 Segmental and somatic dysfunction of lower extremity: Secondary | ICD-10-CM | POA: Diagnosis not present

## 2016-10-14 DIAGNOSIS — M9902 Segmental and somatic dysfunction of thoracic region: Secondary | ICD-10-CM | POA: Diagnosis not present

## 2016-10-14 DIAGNOSIS — I10 Essential (primary) hypertension: Secondary | ICD-10-CM | POA: Diagnosis not present

## 2016-10-14 DIAGNOSIS — M25561 Pain in right knee: Secondary | ICD-10-CM | POA: Diagnosis not present

## 2016-10-14 DIAGNOSIS — M546 Pain in thoracic spine: Secondary | ICD-10-CM | POA: Diagnosis not present

## 2016-10-14 DIAGNOSIS — M9905 Segmental and somatic dysfunction of pelvic region: Secondary | ICD-10-CM | POA: Diagnosis not present

## 2016-10-14 DIAGNOSIS — M9907 Segmental and somatic dysfunction of upper extremity: Secondary | ICD-10-CM | POA: Diagnosis not present

## 2016-10-14 DIAGNOSIS — M9903 Segmental and somatic dysfunction of lumbar region: Secondary | ICD-10-CM | POA: Diagnosis not present

## 2016-10-14 DIAGNOSIS — M4727 Other spondylosis with radiculopathy, lumbosacral region: Secondary | ICD-10-CM | POA: Diagnosis not present

## 2016-10-14 DIAGNOSIS — M25562 Pain in left knee: Secondary | ICD-10-CM | POA: Diagnosis not present

## 2016-10-16 DIAGNOSIS — M546 Pain in thoracic spine: Secondary | ICD-10-CM | POA: Diagnosis not present

## 2016-10-16 DIAGNOSIS — M9906 Segmental and somatic dysfunction of lower extremity: Secondary | ICD-10-CM | POA: Diagnosis not present

## 2016-10-16 DIAGNOSIS — M25562 Pain in left knee: Secondary | ICD-10-CM | POA: Diagnosis not present

## 2016-10-16 DIAGNOSIS — M9903 Segmental and somatic dysfunction of lumbar region: Secondary | ICD-10-CM | POA: Diagnosis not present

## 2016-10-16 DIAGNOSIS — M9907 Segmental and somatic dysfunction of upper extremity: Secondary | ICD-10-CM | POA: Diagnosis not present

## 2016-10-16 DIAGNOSIS — M5416 Radiculopathy, lumbar region: Secondary | ICD-10-CM | POA: Diagnosis not present

## 2016-10-16 DIAGNOSIS — M9905 Segmental and somatic dysfunction of pelvic region: Secondary | ICD-10-CM | POA: Diagnosis not present

## 2016-10-16 DIAGNOSIS — M9902 Segmental and somatic dysfunction of thoracic region: Secondary | ICD-10-CM | POA: Diagnosis not present

## 2016-10-16 DIAGNOSIS — M25561 Pain in right knee: Secondary | ICD-10-CM | POA: Diagnosis not present

## 2016-10-18 DIAGNOSIS — M9905 Segmental and somatic dysfunction of pelvic region: Secondary | ICD-10-CM | POA: Diagnosis not present

## 2016-10-18 DIAGNOSIS — M25561 Pain in right knee: Secondary | ICD-10-CM | POA: Diagnosis not present

## 2016-10-18 DIAGNOSIS — M5416 Radiculopathy, lumbar region: Secondary | ICD-10-CM | POA: Diagnosis not present

## 2016-10-18 DIAGNOSIS — M546 Pain in thoracic spine: Secondary | ICD-10-CM | POA: Diagnosis not present

## 2016-10-18 DIAGNOSIS — M25562 Pain in left knee: Secondary | ICD-10-CM | POA: Diagnosis not present

## 2016-10-18 DIAGNOSIS — M9907 Segmental and somatic dysfunction of upper extremity: Secondary | ICD-10-CM | POA: Diagnosis not present

## 2016-10-18 DIAGNOSIS — M9902 Segmental and somatic dysfunction of thoracic region: Secondary | ICD-10-CM | POA: Diagnosis not present

## 2016-10-18 DIAGNOSIS — M9903 Segmental and somatic dysfunction of lumbar region: Secondary | ICD-10-CM | POA: Diagnosis not present

## 2016-10-18 DIAGNOSIS — M9906 Segmental and somatic dysfunction of lower extremity: Secondary | ICD-10-CM | POA: Diagnosis not present

## 2016-10-21 DIAGNOSIS — M9905 Segmental and somatic dysfunction of pelvic region: Secondary | ICD-10-CM | POA: Diagnosis not present

## 2016-10-21 DIAGNOSIS — M25561 Pain in right knee: Secondary | ICD-10-CM | POA: Diagnosis not present

## 2016-10-21 DIAGNOSIS — M546 Pain in thoracic spine: Secondary | ICD-10-CM | POA: Diagnosis not present

## 2016-10-21 DIAGNOSIS — M25562 Pain in left knee: Secondary | ICD-10-CM | POA: Diagnosis not present

## 2016-10-21 DIAGNOSIS — M9906 Segmental and somatic dysfunction of lower extremity: Secondary | ICD-10-CM | POA: Diagnosis not present

## 2016-10-21 DIAGNOSIS — M9903 Segmental and somatic dysfunction of lumbar region: Secondary | ICD-10-CM | POA: Diagnosis not present

## 2016-10-21 DIAGNOSIS — M9902 Segmental and somatic dysfunction of thoracic region: Secondary | ICD-10-CM | POA: Diagnosis not present

## 2016-10-21 DIAGNOSIS — M5416 Radiculopathy, lumbar region: Secondary | ICD-10-CM | POA: Diagnosis not present

## 2016-10-21 DIAGNOSIS — M9907 Segmental and somatic dysfunction of upper extremity: Secondary | ICD-10-CM | POA: Diagnosis not present

## 2016-10-22 DIAGNOSIS — K56609 Unspecified intestinal obstruction, unspecified as to partial versus complete obstruction: Secondary | ICD-10-CM

## 2016-10-22 HISTORY — DX: Unspecified intestinal obstruction, unspecified as to partial versus complete obstruction: K56.609

## 2016-10-22 HISTORY — PX: OTHER SURGICAL HISTORY: SHX169

## 2016-10-23 DIAGNOSIS — M9906 Segmental and somatic dysfunction of lower extremity: Secondary | ICD-10-CM | POA: Diagnosis not present

## 2016-10-23 DIAGNOSIS — M25562 Pain in left knee: Secondary | ICD-10-CM | POA: Diagnosis not present

## 2016-10-23 DIAGNOSIS — M5416 Radiculopathy, lumbar region: Secondary | ICD-10-CM | POA: Diagnosis not present

## 2016-10-23 DIAGNOSIS — M9903 Segmental and somatic dysfunction of lumbar region: Secondary | ICD-10-CM | POA: Diagnosis not present

## 2016-10-23 DIAGNOSIS — M9905 Segmental and somatic dysfunction of pelvic region: Secondary | ICD-10-CM | POA: Diagnosis not present

## 2016-10-23 DIAGNOSIS — M546 Pain in thoracic spine: Secondary | ICD-10-CM | POA: Diagnosis not present

## 2016-10-23 DIAGNOSIS — M25561 Pain in right knee: Secondary | ICD-10-CM | POA: Diagnosis not present

## 2016-10-23 DIAGNOSIS — M9902 Segmental and somatic dysfunction of thoracic region: Secondary | ICD-10-CM | POA: Diagnosis not present

## 2016-10-23 DIAGNOSIS — M9907 Segmental and somatic dysfunction of upper extremity: Secondary | ICD-10-CM | POA: Diagnosis not present

## 2016-10-26 ENCOUNTER — Other Ambulatory Visit: Payer: Self-pay | Admitting: Physician Assistant

## 2016-10-28 ENCOUNTER — Encounter: Payer: Self-pay | Admitting: Physician Assistant

## 2016-10-28 ENCOUNTER — Telehealth: Payer: Self-pay | Admitting: Cardiovascular Disease

## 2016-10-28 ENCOUNTER — Ambulatory Visit (INDEPENDENT_AMBULATORY_CARE_PROVIDER_SITE_OTHER): Payer: PPO | Admitting: Physician Assistant

## 2016-10-28 VITALS — BP 124/82 | HR 71 | Temp 97.6°F | Resp 14 | Wt 173.2 lb

## 2016-10-28 DIAGNOSIS — M25561 Pain in right knee: Secondary | ICD-10-CM | POA: Diagnosis not present

## 2016-10-28 DIAGNOSIS — M9907 Segmental and somatic dysfunction of upper extremity: Secondary | ICD-10-CM | POA: Diagnosis not present

## 2016-10-28 DIAGNOSIS — M25562 Pain in left knee: Secondary | ICD-10-CM | POA: Diagnosis not present

## 2016-10-28 DIAGNOSIS — Z9114 Patient's other noncompliance with medication regimen: Secondary | ICD-10-CM

## 2016-10-28 DIAGNOSIS — I5022 Chronic systolic (congestive) heart failure: Secondary | ICD-10-CM

## 2016-10-28 DIAGNOSIS — M9902 Segmental and somatic dysfunction of thoracic region: Secondary | ICD-10-CM | POA: Diagnosis not present

## 2016-10-28 DIAGNOSIS — M7552 Bursitis of left shoulder: Secondary | ICD-10-CM

## 2016-10-28 DIAGNOSIS — M5416 Radiculopathy, lumbar region: Secondary | ICD-10-CM | POA: Diagnosis not present

## 2016-10-28 DIAGNOSIS — I10 Essential (primary) hypertension: Secondary | ICD-10-CM

## 2016-10-28 DIAGNOSIS — M9905 Segmental and somatic dysfunction of pelvic region: Secondary | ICD-10-CM | POA: Diagnosis not present

## 2016-10-28 DIAGNOSIS — E039 Hypothyroidism, unspecified: Secondary | ICD-10-CM | POA: Diagnosis not present

## 2016-10-28 DIAGNOSIS — M546 Pain in thoracic spine: Secondary | ICD-10-CM | POA: Diagnosis not present

## 2016-10-28 DIAGNOSIS — M1711 Unilateral primary osteoarthritis, right knee: Secondary | ICD-10-CM

## 2016-10-28 DIAGNOSIS — E785 Hyperlipidemia, unspecified: Secondary | ICD-10-CM

## 2016-10-28 DIAGNOSIS — M9903 Segmental and somatic dysfunction of lumbar region: Secondary | ICD-10-CM | POA: Diagnosis not present

## 2016-10-28 DIAGNOSIS — M9906 Segmental and somatic dysfunction of lower extremity: Secondary | ICD-10-CM | POA: Diagnosis not present

## 2016-10-28 LAB — TSH: TSH: 3.07 mIU/L

## 2016-10-28 NOTE — Telephone Encounter (Signed)
New Message  Pt voiced has a question for [place].  Please f/u

## 2016-10-28 NOTE — Telephone Encounter (Signed)
Spoke with patient who noted diagnosis of CHF on her paperwork from her PCP. She states she thought at her last ov that Dr. Acie Fredrickson said she didn't show any signs of CHF. I explained the diagnosis of chronic systolic CHF and the results of her 2010 echo with EF of 40-45% and then the repeat in 2015 that showed improvement to 55-60%. I reviewed the signs/symptoms of heart failure and she denies having any of those at this time. I advised her to call back if those symptoms occur. I advised her that I will make a note to ask Dr. Acie Fredrickson if echo needs to be repeated at next ov unless she has symptoms that warrant ordering one before next visit. She verbalized agreement. She then asked me about her leg pain and I reviewed the PAD assessment that we do on new patients in our office. She states she feels like her pain may be more orthopedic in nature. She states she is scheduled for PT and I advised her to call back if therapist or if another provider feels that she needs lower extremity ultrasound. She verbalized understanding and agreement and thanked me for the call.

## 2016-10-28 NOTE — Progress Notes (Signed)
Patient ID: GLENDA SPELMAN MRN: [id number], DOB: 1946-03-22, 71 y.o. Date of Encounter: @DATE @  Chief Complaint:  Chief Complaint  Patient presents with  . Hyperlipidemia    f/u  . Hypertension    f/u    HPI: 71 y.o. year old AA female  presents for routine f/u of hypertension, hyperlipidemia, hypothyroidism.  At [place] 12/02/2013-- The nurse today made me aware of the fact that patient has not been taking medications as directed by the cardiologist and has not followed up with a cardiologist as directed. I discussed this with the patient again. She says that she was given a 90 day supply of HCTZ and potassium. Initially, she says that she was given this 90 day supply and then she had followup tests and they were normal so she felt that she no longer needed the medicines after the 90s they supply was over. Then I explained to her that she was given these medications because her blood pressure was elevated at that office visit and that regardless of the test results she needed to be taking these medicines. Then she tells me that she is "taking enough pills".  She says that she" wants to see him (the cardiologist) again before she starts it." Told her that her blood pressure is high again today and that she needs the medicine. Then she says is high today because there has been stress going on at home. Then I told her that it was also high at the cardiologist's last appointment. At the end of the conversation she refused to restart the medicines at this time and promises that she will followup with Dr. Acie Fredrickson.  At [place] 05/2014-- She says she did see Dr. Duanne Limerick she IS taking all meds that are on med list.   At [place] 12/14/2014: She says that she is taking her thyroid medicine daily. says she is taking her cholesterol medicine daily. Having no myalgias or other adverse effects.  She says that she has having problems with pain in her left shoulder and her right knee. She had office visit  here with Dr. Dennard Schaumann 06/23/14. At that time, he did a cortisone injection. Ordered follow-up MRI. MRI performed 07/19/14. Showed supraspinatus and infraspinatus tendinopathy without tear. Moderate acromioclavicular osteoarthritis. She says that she has seen Dr. Marlou Sa in the past for her knee and would like to follow-up with Dr. Marlou Sa at orthopedics.  At [place] 06/21/15: Says that she did see Dr. Marlou Sa and he told her that she had arthritis in her right knee and that down the road, in years, that she would need knee replacement. Says that she is not interested in having surgery. Says that she is considering going to [facility]--- says that she keeps seeing advertisements for this and is wondering if they may have a treatment that would help. In the meantime I told her I would prescribe her some Celebrex to take on a daily basis and see if this helps.  She states that she is taking all of her blood pressure medicines as directed. Taking her thyroid medicine. Taking her Lipitor even though she does cut this in half for 40 mg dose rather than 80 mg.   OV 12/06/2015: She IS taking BP meds as directed. No adv effects.  She IS taking Lipitor--1/2 of 80mg  daily (40mg ) She IS taking thyroid med daily.  She did see Cardiology for routine f/u---I reviewed that note--dated 08/25/2015-- no med changes--f/u 1 year. She IS compliant with all meds at this time!!!!  She has no complaints or concerns today.  HOWEVER---when I got lab results back from that visit 11/2015 her lipids were up--- when we then asked her about this she admitted that she had not been taking her atorvastatin every single day.  04/08/2016: Today, and I initially went through all of the different medicines she shook her head in agreement that she was taking them. However I then saw my lab result note attached to the labs from last visit 12/06/15. Today I discussed this issue about the atorvastatin. She says that her left shoulder was hurting and she  was talking to some other friends and they said that they had similar issues with Lipitor. Told her that they just didn't take the Lipitor every single day. She says that she has been taking the Lipitor every other day or every 2 days. Shoulder doesn't seem to hurt as much when she does this. If she takes the medicine every day, her left shoulder hurts. She states that she is taking the other medicines as directed. She has no complaints or concerns today.  10/28/2016: At last visit she was not taking Lipitor secondary to myalgias so I discussed changing to Crestor. Today she states that she did not take the Crestor and is not going to take any statin right now.  Says she is having a lot of pain and stiffness in her knees and shoulder. Says that right now she is going to therapy and if she doesn't get better she's going to orthopedics.  Says she tried to get [facility] but the insurance would not pay for that. Says that when she takes statins her aches and pains are worse.  Says "I've got to feel better "then I may get back on a statin but not now when all this is going on ".  At today's visit I discussed Zetia and discussed that it is not a statin and it simply decreases absorption of cholesterol from the intestine and she still refuses to take this. Discussed that her LDL off of medicine was 195 and still defers medicine. States that she currently is smoking about 5 cigarettes per week. Says that she is really cutting back and trying to quit that. She is taking blood pressure medicines as directed and having no adverse effects with this. She is taking thyroid medicine as directed and having no adverse effects with this.   Past Medical History:  Diagnosis Date  . CHF (congestive heart failure) (HCC)    EF 45-50%  . Hyperlipidemia   . Hypertension   . Smoker   . Thyroid disease      Home Meds: Outpatient Medications Prior to Visit  Medication Sig Dispense Refill  . amLODipine (NORVASC) 10 MG  tablet TAKE ONE TABLET BY MOUTH ONCE DAILY 90 tablet 2  . aspirin 81 MG tablet Take 81 mg by mouth daily.    . carvedilol (COREG) 3.125 MG tablet Take 3.125 mg by mouth daily.  1  . levothyroxine (SYNTHROID, LEVOTHROID) 75 MCG tablet TAKE ONE TABLET BY MOUTH ONCE DAILY 90 tablet 0  . Multiple Vitamins-Minerals (MULTIVITAMIN WITH MINERALS) tablet Take 1 tablet by mouth daily.    . Omega-3 Fatty Acids (FISH OIL PO) Take 1 tablet by mouth daily.    . rosuvastatin (CRESTOR) 20 MG tablet Take 1 tablet (20 mg total) by mouth daily. 90 tablet 3   No facility-administered medications prior to visit.     Allergies:  Allergies  Allergen Reactions  . Lisinopril Swelling  Angioedema  . Atorvastatin Other (See Comments)    Muscle aches      Social History   Social History  . Marital status: Widowed    Spouse name: N/A  . Number of children: N/A  . Years of education: N/A   Occupational History  . Not on file.   Social History Main Topics  . Smoking status: Current Every Day Smoker    Packs/day: 0.75    Years: 0.00    Types: E-cigarettes  . Smokeless tobacco: Never Used     Comment: Smoker since 1975 has quit and started back several times!!  . Alcohol use No  . Drug use: No  . Sexual activity: Not Currently    Birth control/ protection: Surgical   Other Topics Concern  . Not on file   Social History Narrative  . No narrative on file    Family History  Problem Relation Age of Onset  . Cancer Sister   . Diabetes Brother   . Diabetes Brother   . Hypertension Other      Review of Systems:  See HPI for pertinent ROS. All other ROS negative.    Physical Exam: Blood pressure 124/82, pulse 71, temperature 97.6 F (36.4 C), temperature source Oral, resp. rate 14, weight 173 lb 3.2 oz (78.6 kg), SpO2 98 %., Body mass index is 30.68 kg/m. General: WNWD AAF. Appears in no acute distress. Neck: Supple. No thyromegaly. No lymphadenopathy. No carotid bruit. Lungs: Clear  bilaterally to auscultation without wheezes, rales, or rhonchi. Breathing is unlabored. Heart: RRR with S1 S2. No murmurs, rubs, or gallops. Abdomen: Soft, non-tender, non-distended with normoactive bowel sounds. No hepatomegaly. No rebound/guarding. No obvious abdominal masses. Musculoskeletal:  Strength and tone normal for age. Extremities/Skin: Warm and dry.  No LE edema.  Neuro: Alert and oriented X 3. Moves all extremities spontaneously. Gait is normal. CNII-XII grossly in tact. Psych:  Responds to questions appropriately with a normal affect.     ASSESSMENT AND PLAN:  71 y.o. year old female with  1. Hypertension 10/28/2016: BP at goal/controlled. Cont current meds. Check labs to monitor.  - COMPLETE METABOLIC PANEL WITH GFR  2. Dyslipidemia 10/28/2016: See HPI---She refuses to take any cholesterol medication until her aches and pains decrease---Discussed Zetia, Discussed risk of untreated LDL 195. She still refuses medication for this.  3. Hypothyroidism 10/28/2016:She takes thyroid supplement. Check lab to monitor. - TSH  4. Smoker 10/28/2016:Cardiology and I have discussed need for cessation but she is not motivated to quit.   She is aware of medical consequences of continuing to smoke.   She has decreased to just about 5 or 6 cigarettes per week  5. MEDICATION NONCOMPLIANCE -- SEE HPI REGARDING BP MEDS IN PAST. SEE NOTE BELOW AND FROM 03/2016 REGARDING CHOLESTEROL MEDS IN PAST !!!  I COPIED BELOW INFORMATION FROM MY OV NOTE DATED 05/27/2013: hyperlipidemia: In the past she had extremely high cholesterol with LDL 201. However she was repeatedly noncompliant with medications. At her last visit with me June 2014 I asked her why she was repeatedly noncompliant with these medicines. She said "I don't want to take another pill. If I start a pill I will have to take it the rest of my Life." at that time I discussed the risk of MI stroke death versus taking a pill. She was agreeable to take  the medication.  SHe had repeat lipid panel done at that visit in June 2014. LDL was 208. I prescribed Lipitor 80 mg.  She says that when she took that it caused a significant headache. She cut the pill in half.  She is taking Lipitor 40 mg daily with no adverse effects.  At Prior OV she reported:  Lipitor 80 mg caused headache. Patiently currently taking Lipitor 40 mg and tolerates this well  10/28/2016: See HPI---She refuses to take any cholesterol medication until her aches and pains decrease---Discussed Zetia, Discussed risk of untreated LDL 195. She still refuses medication for this.    6. Congestive heart failure  At her last visit with me 11/25/2012 I did not have any cardiac history documented in her paper chart but then I saw this diagnosis in Epic. I Then read notes in Epic and discussed with patient.  She saw Dr. Acie Fredrickson in past secondary to palpitations. Also had chest pain and negative nuclear stress test March 2010. She knows nothing about the CHF and I do not see any actual test reports reg CHF but do see this in Dr. Elmarie Shiley office visit note.  She has no CHF symptoms or findings on exam. She is on Coreg and ACE inhibitor.  At 05/2013 office visit I told her to schedule followup with Dr. Acie Fredrickson.  The last office visit note in Epic with him was 10/04/10.  At 11/2013 OV she said that she forgot all about initial and followup with Dr. Acie Fredrickson.  At 11/2013 I scheduled referral. She was agreeable to followup.  At [place] 05/2014 she says she did f/u with Dr. Acie Fredrickson and she is taking meds as directed.  At visit 05/2015 I reviewed her last visit with Dr. Acie Fredrickson was 08/25/14. When I printed her AVS at today's visit I do not see that she has any appointment scheduled with him/cardiology. Told her to make sure that she does follow-up with him. She did have f/u Ov with Dr. Acie Fredrickson 08/25/2015--- No changes--f/u 1 year.  #7 She sees gynecology for her breast exam, mammogram, pelvic exam Pap smear.   8.  Colonoscopy: She states that she had one at age 68.  Says she had a second one at age 64. This one was also normal and was to repeat 10 years.  Says it was with [facility] -----------THIS WILL BE DUE AT AGE 78------WILL DISCUSS AT NEXT OV----------------------------------------------------------  #8 immunizations:  Tetanus: Given here 05/2013 Pneumonia vaccine: Prevnar 21 --given here 05/2013.  Pneumovax 23. --given here 05/2014 Zostavax: She says that she had this in the past with Dr. Joaquim Lai.  Influenza vaccine: N/A--Not currently Flu season  Osteoarthritis Right Knee Has been evaluated by Dr. Marlou Sa orthopedics. She is interested in going to [facility] to see if they have any other treatment options. At visit 06/21/15 I prescribed Celebrex 200 mg 1 by mouth daily. Reviewed that her renal function has been normal. At [place] 10/28/2016--- she states that insurance would not cover [facility]. Reports that she currently is doing physical therapy and will follow up with orthopedics if does not improve with therapy.   F/U for Routine OV 6 months. F/U sooner if needed.   Signed, [address] [place], [place], B[facility] 10/28/2016 9:22 AM

## 2016-10-28 NOTE — Telephone Encounter (Signed)
Refill appropriate 

## 2016-10-30 DIAGNOSIS — M546 Pain in thoracic spine: Secondary | ICD-10-CM | POA: Diagnosis not present

## 2016-10-30 DIAGNOSIS — M9902 Segmental and somatic dysfunction of thoracic region: Secondary | ICD-10-CM | POA: Diagnosis not present

## 2016-10-30 DIAGNOSIS — M9906 Segmental and somatic dysfunction of lower extremity: Secondary | ICD-10-CM | POA: Diagnosis not present

## 2016-10-30 DIAGNOSIS — M9905 Segmental and somatic dysfunction of pelvic region: Secondary | ICD-10-CM | POA: Diagnosis not present

## 2016-10-30 DIAGNOSIS — M5416 Radiculopathy, lumbar region: Secondary | ICD-10-CM | POA: Diagnosis not present

## 2016-10-30 DIAGNOSIS — M9907 Segmental and somatic dysfunction of upper extremity: Secondary | ICD-10-CM | POA: Diagnosis not present

## 2016-10-30 DIAGNOSIS — M25562 Pain in left knee: Secondary | ICD-10-CM | POA: Diagnosis not present

## 2016-10-30 DIAGNOSIS — M25561 Pain in right knee: Secondary | ICD-10-CM | POA: Diagnosis not present

## 2016-10-30 DIAGNOSIS — M9903 Segmental and somatic dysfunction of lumbar region: Secondary | ICD-10-CM | POA: Diagnosis not present

## 2016-11-01 DIAGNOSIS — M9906 Segmental and somatic dysfunction of lower extremity: Secondary | ICD-10-CM | POA: Diagnosis not present

## 2016-11-01 DIAGNOSIS — M9907 Segmental and somatic dysfunction of upper extremity: Secondary | ICD-10-CM | POA: Diagnosis not present

## 2016-11-01 DIAGNOSIS — M9902 Segmental and somatic dysfunction of thoracic region: Secondary | ICD-10-CM | POA: Diagnosis not present

## 2016-11-01 DIAGNOSIS — M25562 Pain in left knee: Secondary | ICD-10-CM | POA: Diagnosis not present

## 2016-11-01 DIAGNOSIS — M9905 Segmental and somatic dysfunction of pelvic region: Secondary | ICD-10-CM | POA: Diagnosis not present

## 2016-11-01 DIAGNOSIS — M25561 Pain in right knee: Secondary | ICD-10-CM | POA: Diagnosis not present

## 2016-11-01 DIAGNOSIS — M5416 Radiculopathy, lumbar region: Secondary | ICD-10-CM | POA: Diagnosis not present

## 2016-11-01 DIAGNOSIS — M546 Pain in thoracic spine: Secondary | ICD-10-CM | POA: Diagnosis not present

## 2016-11-01 DIAGNOSIS — M9903 Segmental and somatic dysfunction of lumbar region: Secondary | ICD-10-CM | POA: Diagnosis not present

## 2016-11-11 ENCOUNTER — Telehealth: Payer: Self-pay | Admitting: Orthopedic Surgery

## 2016-11-11 NOTE — Telephone Encounter (Signed)
Pt's granddaughter called and stated that [place] was out of town and this appointment needed to be canceled for now.  Anner will call back to reschedule

## 2016-11-12 ENCOUNTER — Ambulatory Visit: Payer: PPO | Admitting: Orthopedic Surgery

## 2016-11-13 ENCOUNTER — Encounter (HOSPITAL_COMMUNITY): Payer: Self-pay | Admitting: *Deleted

## 2016-11-13 ENCOUNTER — Emergency Department (HOSPITAL_COMMUNITY): Payer: PPO

## 2016-11-13 ENCOUNTER — Inpatient Hospital Stay (HOSPITAL_COMMUNITY)
Admission: EM | Admit: 2016-11-13 | Discharge: 2016-11-16 | DRG: 389 | Discharge status: Against Medical Advice | Payer: PPO | Attending: Internal Medicine | Admitting: Internal Medicine

## 2016-11-13 DIAGNOSIS — R109 Unspecified abdominal pain: Secondary | ICD-10-CM

## 2016-11-13 DIAGNOSIS — E039 Hypothyroidism, unspecified: Secondary | ICD-10-CM | POA: Diagnosis present

## 2016-11-13 DIAGNOSIS — F17290 Nicotine dependence, other tobacco product, uncomplicated: Secondary | ICD-10-CM | POA: Diagnosis present

## 2016-11-13 DIAGNOSIS — K5651 Intestinal adhesions [bands], with partial obstruction: Secondary | ICD-10-CM | POA: Diagnosis not present

## 2016-11-13 DIAGNOSIS — R001 Bradycardia, unspecified: Secondary | ICD-10-CM | POA: Diagnosis present

## 2016-11-13 DIAGNOSIS — Z7982 Long term (current) use of aspirin: Secondary | ICD-10-CM

## 2016-11-13 DIAGNOSIS — F17200 Nicotine dependence, unspecified, uncomplicated: Secondary | ICD-10-CM | POA: Diagnosis not present

## 2016-11-13 DIAGNOSIS — E038 Other specified hypothyroidism: Secondary | ICD-10-CM | POA: Diagnosis not present

## 2016-11-13 DIAGNOSIS — K56609 Unspecified intestinal obstruction, unspecified as to partial versus complete obstruction: Secondary | ICD-10-CM | POA: Diagnosis not present

## 2016-11-13 DIAGNOSIS — R739 Hyperglycemia, unspecified: Secondary | ICD-10-CM | POA: Diagnosis not present

## 2016-11-13 DIAGNOSIS — I110 Hypertensive heart disease with heart failure: Secondary | ICD-10-CM | POA: Diagnosis present

## 2016-11-13 DIAGNOSIS — E785 Hyperlipidemia, unspecified: Secondary | ICD-10-CM | POA: Diagnosis present

## 2016-11-13 DIAGNOSIS — Z8249 Family history of ischemic heart disease and other diseases of the circulatory system: Secondary | ICD-10-CM

## 2016-11-13 DIAGNOSIS — K56600 Partial intestinal obstruction, unspecified as to cause: Secondary | ICD-10-CM | POA: Diagnosis present

## 2016-11-13 DIAGNOSIS — R1033 Periumbilical pain: Secondary | ICD-10-CM | POA: Diagnosis not present

## 2016-11-13 DIAGNOSIS — R1110 Vomiting, unspecified: Secondary | ICD-10-CM | POA: Diagnosis present

## 2016-11-13 DIAGNOSIS — R7302 Impaired glucose tolerance (oral): Secondary | ICD-10-CM | POA: Diagnosis present

## 2016-11-13 DIAGNOSIS — Z66 Do not resuscitate: Secondary | ICD-10-CM | POA: Diagnosis present

## 2016-11-13 DIAGNOSIS — Z9114 Patient's other noncompliance with medication regimen: Secondary | ICD-10-CM

## 2016-11-13 DIAGNOSIS — Z91148 Patient's other noncompliance with medication regimen for other reason: Secondary | ICD-10-CM

## 2016-11-13 DIAGNOSIS — Z833 Family history of diabetes mellitus: Secondary | ICD-10-CM

## 2016-11-13 DIAGNOSIS — I10 Essential (primary) hypertension: Secondary | ICD-10-CM | POA: Diagnosis not present

## 2016-11-13 DIAGNOSIS — K567 Ileus, unspecified: Secondary | ICD-10-CM | POA: Diagnosis not present

## 2016-11-13 DIAGNOSIS — I5022 Chronic systolic (congestive) heart failure: Secondary | ICD-10-CM | POA: Diagnosis present

## 2016-11-13 DIAGNOSIS — Z79899 Other long term (current) drug therapy: Secondary | ICD-10-CM

## 2016-11-13 DIAGNOSIS — K56699 Other intestinal obstruction unspecified as to partial versus complete obstruction: Secondary | ICD-10-CM | POA: Diagnosis not present

## 2016-11-13 HISTORY — DX: Pain in unspecified knee: M25.569

## 2016-11-13 HISTORY — DX: Pain in unspecified shoulder: M25.519

## 2016-11-13 HISTORY — DX: Unspecified intestinal obstruction, unspecified as to partial versus complete obstruction: K56.609

## 2016-11-13 LAB — COMPREHENSIVE METABOLIC PANEL
ALBUMIN: 3.9 g/dL (ref 3.5–5.0)
ALT: 19 U/L (ref 14–54)
AST: 23 U/L (ref 15–41)
Alkaline Phosphatase: 71 U/L (ref 38–126)
Anion gap: 7 (ref 5–15)
BILIRUBIN TOTAL: 0.7 mg/dL (ref 0.3–1.2)
BUN: 11 mg/dL (ref 6–20)
CO2: 28 mmol/L (ref 22–32)
Calcium: 9.5 mg/dL (ref 8.9–10.3)
Chloride: 105 mmol/L (ref 101–111)
Creatinine, Ser: 0.84 mg/dL (ref 0.44–1.00)
GFR calc Af Amer: >60 mL/min (ref >60)
GFR calc non Af Amer: >60 mL/min (ref >60)
GLUCOSE: 129 mg/dL — AB (ref 65–99)
POTASSIUM: 4.3 mmol/L (ref 3.5–5.1)
SODIUM: 140 mmol/L (ref 135–145)
Total Protein: 7.7 g/dL (ref 6.5–8.1)

## 2016-11-13 LAB — CBC WITH DIFFERENTIAL/PLATELET
Basophils Absolute: 0 10*3/uL (ref 0.0–0.1)
Basophils Relative: 0 %
EOS PCT: 1 %
Eosinophils Absolute: 0.1 10*3/uL (ref 0.0–0.7)
HEMATOCRIT: 39.1 % (ref 36.0–46.0)
Hemoglobin: 13.1 g/dL (ref 12.0–15.0)
LYMPHS ABS: 2.1 10*3/uL (ref 0.7–4.0)
LYMPHS PCT: 19 %
MCH: 30.7 pg (ref 26.0–34.0)
MCHC: 33.5 g/dL (ref 30.0–36.0)
MCV: 91.6 fL (ref 78.0–100.0)
MONOS PCT: 4 %
Monocytes Absolute: 0.5 10*3/uL (ref 0.1–1.0)
NEUTROS ABS: 8.7 10*3/uL — AB (ref 1.7–7.7)
Neutrophils Relative %: 76 %
PLATELETS: 307 10*3/uL (ref 150–400)
RBC: 4.27 MIL/uL (ref 3.87–5.11)
RDW: 14.3 % (ref 11.5–15.5)
WBC: 11.3 10*3/uL — ABNORMAL HIGH (ref 4.0–10.5)

## 2016-11-13 LAB — URINALYSIS, ROUTINE W REFLEX MICROSCOPIC
Bilirubin Urine: NEGATIVE
GLUCOSE, UA: NEGATIVE mg/dL
Hgb urine dipstick: NEGATIVE
Ketones, ur: NEGATIVE mg/dL
LEUKOCYTES UA: NEGATIVE
Nitrite: NEGATIVE
PH: 6 (ref 5.0–8.0)
PROTEIN: NEGATIVE mg/dL
SPECIFIC GRAVITY, URINE: 1.014 (ref 1.005–1.030)

## 2016-11-13 LAB — LIPASE, BLOOD: Lipase: 23 U/L (ref 11–51)

## 2016-11-13 MED ORDER — ACETAMINOPHEN 325 MG PO TABS
650.0000 mg | ORAL_TABLET | Freq: Four times a day (QID) | ORAL | Status: DC | PRN
  Administered 2016-11-14: 650 mg via ORAL
  Filled 2016-11-13: qty 2

## 2016-11-13 MED ORDER — MORPHINE SULFATE (PF) 4 MG/ML IV SOLN
4.0000 mg | INTRAVENOUS | Status: AC | PRN
Start: 2016-11-13 — End: 2016-11-13
  Administered 2016-11-13 (×2): 4 mg via INTRAVENOUS
  Filled 2016-11-13 (×2): qty 1

## 2016-11-13 MED ORDER — AMLODIPINE BESYLATE 5 MG PO TABS: 10.0000 mg | ORAL_TABLET | Freq: Every day | ORAL | Status: DC

## 2016-11-13 MED ORDER — SODIUM CHLORIDE 0.9 % IV SOLN
INTRAVENOUS | Status: DC
  Administered 2016-11-13: 16:00:00 via INTRAVENOUS

## 2016-11-13 MED ORDER — MORPHINE SULFATE (PF) 2 MG/ML IV SOLN
2.0000 mg | INTRAVENOUS | Status: DC | PRN
  Administered 2016-11-13 – 2016-11-16 (×7): 2 mg via INTRAVENOUS
  Filled 2016-11-13 (×7): qty 1

## 2016-11-13 MED ORDER — ONDANSETRON HCL 4 MG PO TABS
4.0000 mg | ORAL_TABLET | Freq: Four times a day (QID) | ORAL | Status: DC | PRN
  Administered 2016-11-13 – 2016-11-14 (×2): 4 mg via ORAL
  Filled 2016-11-13 (×2): qty 1

## 2016-11-13 MED ORDER — CARVEDILOL 3.125 MG PO TABS: 3.1250 mg | ORAL_TABLET | Freq: Every day | ORAL | Status: DC

## 2016-11-13 MED ORDER — IOPAMIDOL (ISOVUE-300) INJECTION 61%
100.0000 mL | Freq: Once | INTRAVENOUS | Status: AC | PRN
  Administered 2016-11-13: 100 mL via INTRAVENOUS

## 2016-11-13 MED ORDER — ONDANSETRON HCL 4 MG/2ML IJ SOLN
4.0000 mg | Freq: Four times a day (QID) | INTRAMUSCULAR | Status: DC | PRN
  Administered 2016-11-16: 4 mg via INTRAVENOUS
  Filled 2016-11-13: qty 2

## 2016-11-13 MED ORDER — ACETAMINOPHEN 650 MG RE SUPP: 650.0000 mg | Freq: Four times a day (QID) | RECTAL | Status: DC | PRN

## 2016-11-13 MED ORDER — ASPIRIN EC 81 MG PO TBEC
81.0000 mg | DELAYED_RELEASE_TABLET | Freq: Every day | ORAL | Status: DC
  Administered 2016-11-13 – 2016-11-16 (×4): 81 mg via ORAL
  Filled 2016-11-13 (×4): qty 1

## 2016-11-13 MED ORDER — LEVOTHYROXINE SODIUM 75 MCG PO TABS: 75.0000 ug | ORAL_TABLET | Freq: Every day | ORAL | Status: DC

## 2016-11-13 MED ORDER — ONDANSETRON HCL 4 MG/2ML IJ SOLN
4.0000 mg | INTRAMUSCULAR | Status: AC | PRN
  Administered 2016-11-13 (×2): 4 mg via INTRAVENOUS
  Filled 2016-11-13 (×2): qty 2

## 2016-11-13 NOTE — H&P (Signed)
History and Physical    Adrienne [name] YWV:[id number] DOB: 01/28/1946 DOA: 11/13/2016  PCP: Orlena Sheldon, PA-C Consultants:  [facility] - cardiology Patient coming from: Home - lives with daughter and grandchildren; : daughters, [phone]  Chief Complaint: abdominal pain  HPI: Adrienne [name] is a 71 y.o. female with medical history significant of hypothyroidism, HTN, HLD, CHF, and SBO presenting after she woke up this morning with pain in her abdomen.  Peri-umbilical/suprapubic region.  She felt perfectly fine yesterday.  +nausea, no vomiting.  No fever.  Last BM was this morning, "kind of hard."  Had a similar presentation last year, had a bowel obstruction; she was hospitalized from 1/4-6/17.   S/p hysterectomy, cholecystectomy.   ED Course:  Recurrent SBO.  Discussed with Dr. Arnoldo Morale, who will consult on the patient.  Review of Systems: As per HPI; otherwise review of systems reviewed and negative.   Ambulatory Status:  Ambulates without assistance  Past Medical History:  Diagnosis Date  . CHF (congestive heart failure) (HCC)    EF 45-50%  . Hyperlipidemia   . Hypertension   . Knee pain   . SBO (small bowel obstruction) ([place])   . Shoulder pain   . Smoker   . Thyroid disease     Past Surgical History:  Procedure Laterality Date  . ABDOMINAL HYSTERECTOMY  1986  . BACK SURGERY    . CHOLECYSTECTOMY    . polypectomy-oropharynx    . right knee meniscus      Social History   Social History  . Marital status: Widowed    Spouse name: N/A  . Number of children: N/A  . Years of education: N/A   Occupational History  . retired    Social History Main Topics  . Smoking status: Current Every Day Smoker    Packs/day: 1.00    Years: 53.00    Types: E-cigarettes  . Smokeless tobacco: Never Used     Comment: Smoker since 1975 has quit and started back several times!!, now smoking 7 cigs/week  . Alcohol use No  . Drug use: No  . Sexual activity: Not Currently   Birth control/ protection: Surgical   Other Topics Concern  . Not on file   Social History Narrative  . No narrative on file    Allergies  Allergen Reactions  . Lisinopril Swelling    Angioedema  . Atorvastatin Other (See Comments)    Muscle aches      Family History  Problem Relation Age of Onset  . Cancer Sister   . Diabetes Brother   . Diabetes Brother   . Hypertension Other     Prior to Admission medications   Medication Sig Start Date End Date Taking? Authorizing Provider  amLODipine (NORVASC) 10 MG tablet TAKE ONE TABLET BY MOUTH ONCE DAILY 10/28/16  Yes Orlena Sheldon, PA-C  aspirin 81 MG tablet Take 81 mg by mouth daily.   Yes [provider]  carvedilol (COREG) 3.125 MG tablet Take 3.125 mg by mouth daily. 06/06/15  Yes [provider]  ibuprofen (ADVIL,MOTRIN) 200 MG tablet Take 200 mg by mouth every 6 (six) hours as needed for moderate pain.   Yes [provider]  levothyroxine (SYNTHROID, LEVOTHROID) 75 MCG tablet TAKE ONE TABLET BY MOUTH ONCE DAILY 10/02/16  Yes Orlena Sheldon, PA-C  Multiple Vitamins-Minerals (MULTIVITAMIN WITH MINERALS) tablet Take 1 tablet by mouth daily.   Yes [provider]  Omega-3 Fatty Acids (FISH OIL PO) Take 1 tablet  by mouth daily.   Yes [provider]    Physical Exam: Vitals:   11/13/16 1630 11/13/16 1700 11/13/16 1730 11/13/16 1800  BP: (!) 156/78 (!) 147/72 137/66 (!) 164/78  Pulse: (!) 47 (!) 42 (!) 50 (!) 48  Resp:    18  Temp:      TempSrc:      SpO2: 97% 95% 98% 98%  Weight:      Height:         General:  Appears calm and comfortable and is NAD, very soft-spoken and keeps eyes closed throughout much of interview Eyes:  PERRL, EOMI, normal lids, iris ENT:  grossly normal hearing, lips & tongue, mmm Neck:  no LAD, masses or thyromegaly Cardiovascular:  RRR, no m/r/g. No LE edema.  Respiratory:  CTA bilaterally, no w/r/r. Normal respiratory effort. Abdomen:  soft, ntnd,  NABS other than a paucity of BS in RLQ Skin:  no rash or induration seen on limited exam Musculoskeletal:  grossly normal tone BUE/BLE, good ROM, no bony abnormality Psychiatric:  grossly normal mood and affect, speech fluent and appropriate, AOx3 Neurologic:  CN 2-12 grossly intact, moves all extremities in coordinated fashion, sensation intact  Labs on Admission: I have personally reviewed following labs and imaging studies  CBC:  Recent Labs Lab 11/13/16 1225  WBC 11.3*  NEUTROABS 8.7*  HGB 13.1  HCT 39.1  MCV 91.6  PLT 390   Basic Metabolic Panel:  Recent Labs Lab 11/13/16 1204  NA 140  K 4.3  CL 105  CO2 28  GLUCOSE 129*  BUN 11  CREATININE 0.84  CALCIUM 9.5   GFR: Estimated Creatinine Clearance: 62.2 mL/min (by C-G formula based on SCr of 0.84 mg/dL). Liver Function Tests:  Recent Labs Lab 11/13/16 1204  AST 23  ALT 19  ALKPHOS 71  BILITOT 0.7  PROT 7.7  ALBUMIN 3.9    Recent Labs Lab 11/13/16 1204  LIPASE 23   No results for input(s): AMMONIA in the last 168 hours. Coagulation Profile: No results for input(s): INR, PROTIME in the last 168 hours. Cardiac Enzymes: No results for input(s): CKTOTAL, CKMB, CKMBINDEX, TROPONINI in the last 168 hours. BNP (last 3 results) No results for input(s): PROBNP in the last 8760 hours. HbA1C: No results for input(s): HGBA1C in the last 72 hours. CBG: No results for input(s): GLUCAP in the last 168 hours. Lipid Profile: No results for input(s): CHOL, HDL, LDLCALC, TRIG, CHOLHDL, LDLDIRECT in the last 72 hours. Thyroid Function Tests: No results for input(s): TSH, T4TOTAL, FREET4, T3FREE, THYROIDAB in the last 72 hours. Anemia Panel: No results for input(s): VITAMINB12, FOLATE, FERRITIN, TIBC, IRON, RETICCTPCT in the last 72 hours. Urine analysis:    Component Value Date/Time   COLORURINE YELLOW 11/13/2016 [place] 11/13/2016 1156   LABSPEC 1.014 11/13/2016 1156   PHURINE 6.0  11/13/2016 1156   GLUCOSEU NEGATIVE 11/13/2016 1156   HGBUR NEGATIVE 11/13/2016 [place] 11/13/2016 1156   KETONESUR NEGATIVE 11/13/2016 1156   PROTEINUR NEGATIVE 11/13/2016 1156   NITRITE NEGATIVE 11/13/2016 1156   LEUKOCYTESUR NEGATIVE 11/13/2016 1156    Creatinine Clearance: Estimated Creatinine Clearance: 62.2 mL/min (by C-G formula based on SCr of 0.84 mg/dL).  Sepsis Labs: @LABRCNTIP (procalcitonin:4,lacticidven:4) )No results found for this or any previous visit (from the past 240 hour(s)).   Radiological Exams on Admission: Ct Abdomen Pelvis W Contrast  Result Date: 11/13/2016 CLINICAL DATA:  Acute generalized abdominal pain. EXAM: CT ABDOMEN AND PELVIS  WITH CONTRAST TECHNIQUE: Multidetector CT imaging of the abdomen and pelvis was performed using the standard protocol following bolus administration of intravenous contrast. CONTRAST:  114mL ISOVUE-300 IOPAMIDOL (ISOVUE-300) INJECTION 61% COMPARISON:  CT scan of June 28, 2015. FINDINGS: Lower chest: No acute abnormality. Hepatobiliary: No focal liver abnormality is seen. Status post cholecystectomy. No biliary dilatation. Pancreas: Unremarkable. No pancreatic ductal dilatation or surrounding inflammatory changes. Spleen: Normal in size without focal abnormality. Adrenals/Urinary Tract: Adrenal glands are unremarkable. No hydronephrosis or renal obstruction is noted. 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 Urinary bladder is unremarkable. Stomach/Bowel: The appendix is not clearly identified, but no inflammation is seen in the right lower quadrant. No colonic dilatation is noted. Dilated small bowel loops are noted in the left lower quadrant and pelvis with fecalization noted. Transition zone is noted in the right side of the pelvis best seen on image number 69 of series 2. The small bowel beyond the transition zone is nondilated, but  demonstrates possible enhancing walls suggesting inflammation. Vascular/Lymphatic: Aortic atherosclerosis. No enlarged abdominal or pelvic lymph nodes. Reproductive: Status post hysterectomy. No adnexal masses. Other: No abdominal wall hernia or abnormality. No abdominopelvic ascites. Musculoskeletal: No acute or significant osseous findings. IMPRESSION: Dilated small bowel loops are noted in the left lower quadrant of the abdomen and pelvis with fecalization, secondary to transition zone seen in the right pelvis. The ileum beyond the transition zone is nondilated but did demonstrate possible wall enhancement, suggesting active inflammation. It is uncertain if this partial obstruction may be due to a adhesion or inflammatory bowel disease such as Crohn's disease. Clinical correlation is recommended. Aortic atherosclerosis. Electronically Signed   By: Marijo Conception, M.D.   On: 11/13/2016 15:08    EKG: not done  Assessment/Plan Principal Problem:   Bowel obstruction (HCC) Active Problems:   Hypertension   Chronic systolic CHF (congestive heart failure) (HCC)   Hypothyroidism   Smoker   Hyperglycemia   Bowel obstruction -Patient with prior h/o 2 abdominal surgeries and h/o bowel obstruction presenting with acute onset of abdominal pain with nausea and CT findings c/w recurrence -WBC 11.3 with some ?active inflammation seen on CT; patient denies fever so will follow without antibiotics for now -Negative UA -Will place in observation status on Med Surg -Gen Surg consulted by ER and will see in AM; currently no indication for surgical intervention -NPO for bowel rest -No NG tube in place due to mild nausea not currently concerning to the patient -IVF hydration (somewhat judicious given h/o reported CHF - but normal EF in 1/15 and no mention of diastolic dysfunction) -Pain control with morphine, tylenol -Reports fairly mild symptoms at this point including minimal TTP so hopefully will have quick  recovery  HTN -Patient previously on Coreg but it was stopped during her prior hospitalization due to bradycardia -She again is taking it but only once daily -Consider either increasing to BID for effective use or discontinuing. -Continue Norvasc.  Hyperglycemia Glucose 129, A1c 6.1 on 06/28/15   Hypothyroidism -TSH WNL on 5/7 -Continue Synthroid at home dose.  Tobacco dependence -Encourage cessation.  This was discussed with the patient and should be reviewed on an ongoing basis.   -She reports cutting back a great deal. -Patch declined by patient.  DVT prophylaxis:  SCDs - in case of need for surgical intervention Code Status:  DNR - confirmed with patient/family Family Communication: Daughter present throughout evaluation Disposition  Plan:  Home once clinically improved Consults called: Surgery  Admission status: It is my clinical opinion that referral for OBSERVATION is reasonable and necessary in this patient based on the above information provided. The aforementioned taken together are felt to place the patient at high risk for further clinical deterioration. However it is anticipated that the patient may be medically stable for discharge from the hospital within 24 to 48 hours.    Karmen Bongo MD [facility]  If 7PM-7AM, please contact night-coverage [email or web address] Password [facility]  11/13/2016, 6:53 PM

## 2016-11-13 NOTE — ED Triage Notes (Signed)
Pt comes in with abdominal pain (cramping) starting this morning. She has had nausea, denies vomiting. She made a solid BM this morning.

## 2016-11-13 NOTE — ED Provider Notes (Signed)
[facility] DEPT Provider Note   CSN: [id number] Arrival date & time: 11/13/16  1140     History   Chief Complaint Chief Complaint  Patient presents with  . Abdominal Pain    HPI Adrienne [name] is a 72 y.o. female.  HPI  Pt was seen at 1220.  Per pt, c/o gradual onset and persistence of constant generalized abd "pain" that began this morning.  Has been associated with nausea.  Describes the abd pain as "cramping."  Endorses hx of SBO and she is concerned regarding this today. Denies vomiting/diarrhea, no fevers, no back pain, no rash, no CP/SOB, no black or blood in stools.      Past Medical History:  Diagnosis Date  . CHF (congestive heart failure) (HCC)    EF 45-50%  . Hyperlipidemia   . Hypertension   . SBO (small bowel obstruction) ([place])   . Smoker   . Thyroid disease     Patient Active Problem List   Diagnosis Date Noted  . Cataract of both eyes 01/25/2016  . Glaucoma of both eyes 01/25/2016  . Well woman exam with routine gynecological exam 01/17/2016  . Bradycardia 06/29/2015  . Hypokalemia 06/29/2015  . Small bowel obstruction ([place]) 06/28/2015  . Smoker unmotivated to quit 01/12/2015  . Arthritis of knee, right 01/04/2015  . Bursitis of left shoulder 01/04/2015  . Routine gynecological examination 12/30/2013  . Noncompliance with medications 12/02/2013  . Smoker   . Hypertension 10/04/2010  . Dyslipidemia 10/04/2010  . Chest pain 10/04/2010  . Chronic systolic CHF (congestive heart failure) ([place]) 10/04/2010  . Hypothyroidism 10/04/2010    Past Surgical History:  Procedure Laterality Date  . ABDOMINAL HYSTERECTOMY    . BACK SURGERY    . CHOLECYSTECTOMY    . polypectomy-oropharynx    . right knee meniscus    . VAGINAL HYSTERECTOMY     1986    OB History    Gravida Para Term Preterm AB Living   4 3     1 3    SAB TAB Ectopic Multiple Live Births       1   3       Home Medications    Prior to Admission medications   Medication Sig  Start Date End Date Taking? Authorizing Provider  amLODipine (NORVASC) 10 MG tablet TAKE ONE TABLET BY MOUTH ONCE DAILY 10/28/16   Orlena Sheldon, PA-C  aspirin 81 MG tablet Take 81 mg by mouth daily.    [provider]  carvedilol (COREG) 3.125 MG tablet Take 3.125 mg by mouth daily. 06/06/15   [provider]  levothyroxine (SYNTHROID, LEVOTHROID) 75 MCG tablet TAKE ONE TABLET BY MOUTH ONCE DAILY 10/02/16   Orlena Sheldon, PA-C  Multiple Vitamins-Minerals (MULTIVITAMIN WITH MINERALS) tablet Take 1 tablet by mouth daily.    [provider]  Omega-3 Fatty Acids (FISH OIL PO) Take 1 tablet by mouth daily.    [provider]  rosuvastatin (CRESTOR) 20 MG tablet Take 1 tablet (20 mg total) by mouth daily. 04/08/16   Orlena Sheldon, PA-C    Family History Family History  Problem Relation Age of Onset  . Cancer Sister   . Diabetes Brother   . Diabetes Brother   . Hypertension Other     Social History Social History  Substance Use Topics  . Smoking status: Current Every Day Smoker    Packs/day: 0.75    Years: 0.00    Types: E-cigarettes  . Smokeless tobacco:  Never Used     Comment: Smoker since 1975 has quit and started back several times!!  . Alcohol use No     Allergies   Lisinopril and Atorvastatin   Review of Systems Review of Systems ROS: Statement: All systems negative except as marked or noted in the HPI; Constitutional: Negative for fever and chills. ; ; Eyes: Negative for eye pain, redness and discharge. ; ; ENMT: Negative for ear pain, hoarseness, nasal congestion, sinus pressure and sore throat. ; ; Cardiovascular: Negative for chest pain, palpitations, diaphoresis, dyspnea and peripheral edema. ; ; Respiratory: Negative for cough, wheezing and stridor. ; ; Gastrointestinal: +nausea, abd pain. Negative for vomiting, diarrhea, blood in stool, hematemesis, jaundice and rectal bleeding. . ; ; Genitourinary: Negative for dysuria, flank pain and  hematuria. ; ; Musculoskeletal: Negative for back pain and neck pain. Negative for swelling and trauma.; ; Skin: Negative for pruritus, rash, abrasions, blisters, bruising and skin lesion.; ; Neuro: Negative for headache, lightheadedness and neck stiffness. Negative for weakness, altered level of consciousness, altered mental status, extremity weakness, paresthesias, involuntary movement, seizure and syncope.       Physical Exam Updated Vital Signs BP (!) 152/80 (BP Location: Right Arm)   Pulse (!) 51   Temp 97.9 F (36.6 C) (Oral)   Resp 16   Ht 5\' 3"  (1.6 m)   Wt 77.1 kg (170 lb)   SpO2 100%   BMI 30.11 kg/m   Physical Exam 1225: Physical examination:  Nursing notes reviewed; Vital signs and O2 SAT reviewed;  Constitutional: Well developed, Well nourished, Well hydrated, In no acute distress; Head:  Normocephalic, atraumatic; Eyes: EOMI, PERRL, No scleral icterus; ENMT: Mouth and pharynx normal, Mucous membranes moist; Neck: Supple, Full range of motion, No lymphadenopathy; Cardiovascular: Regular rate and rhythm, No gallop; Respiratory: Breath sounds clear & equal bilaterally, No wheezes.  Speaking full sentences with ease, Normal respiratory effort/excursion; Chest: Nontender, Movement normal; Abdomen: Soft, +periumbilical tenderness to palp. No rebound or guarding. Nondistended, Normal bowel sounds; Genitourinary: No CVA tenderness; Extremities: Pulses normal, No tenderness, No edema, No calf edema or asymmetry.; Neuro: AA&Ox3, Major CN grossly intact.  Speech clear. No gross focal motor or sensory deficits in extremities.; Skin: Color normal, Warm, Dry.   ED Treatments / Results  Labs (all labs ordered are listed, but only abnormal results are displayed)   EKG  EKG Interpretation None       Radiology   Procedures Procedures (including critical care time)  Medications Ordered in ED Medications - No data to display   Initial Impression / Assessment and Plan / ED  Course  I have reviewed the triage vital signs and the nursing notes.  Pertinent labs & imaging results that were available during my care of the patient were reviewed by me and considered in my medical decision making (see chart for details).  MDM Reviewed: previous chart, nursing note and vitals Reviewed previous: labs Interpretation: labs and CT scan   Results for orders placed or performed during the hospital encounter of 11/13/16  Lipase, blood  Result Value Ref Range   Lipase 23 11 - 51 U/L  Comprehensive metabolic panel  Result Value Ref Range   Sodium 140 135 - 145 mmol/L   Potassium 4.3 3.5 - 5.1 mmol/L   Chloride 105 101 - 111 mmol/L   CO2 28 22 - 32 mmol/L   Glucose, Bld 129 (H) 65 - 99 mg/dL   BUN 11 6 - 20 mg/dL   Creatinine,  Ser 0.84 0.44 - 1.00 mg/dL   Calcium 9.5 8.9 - 10.3 mg/dL   Total Protein 7.7 6.5 - 8.1 g/dL   Albumin 3.9 3.5 - 5.0 g/dL   AST 23 15 - 41 U/L   ALT 19 14 - 54 U/L   Alkaline Phosphatase 71 38 - 126 U/L   Total Bilirubin 0.7 0.3 - 1.2 mg/dL   GFR calc non Af Amer >60 >60 mL/min   GFR calc Af Amer >60 >60 mL/min   Anion gap 7 5 - 15  Urinalysis, Routine w reflex microscopic  Result Value Ref Range   Color, Urine YELLOW YELLOW   APPearance CLEAR CLEAR   Specific Gravity, Urine 1.014 1.005 - 1.030   pH 6.0 5.0 - 8.0   Glucose, UA NEGATIVE NEGATIVE mg/dL   Hgb urine dipstick NEGATIVE NEGATIVE   Bilirubin Urine NEGATIVE NEGATIVE   Ketones, ur NEGATIVE NEGATIVE mg/dL   Protein, ur NEGATIVE NEGATIVE mg/dL   Nitrite NEGATIVE NEGATIVE   Leukocytes, UA NEGATIVE NEGATIVE  CBC with Differential  Result Value Ref Range   WBC 11.3 (H) 4.0 - 10.5 K/uL   RBC 4.27 3.87 - 5.11 MIL/uL   Hemoglobin 13.1 12.0 - 15.0 g/dL   HCT 39.1 36.0 - 46.0 %   MCV 91.6 78.0 - 100.0 fL   MCH 30.7 26.0 - 34.0 pg   MCHC 33.5 30.0 - 36.0 g/dL   RDW 14.3 11.5 - 15.5 %   Platelets 307 150 - 400 K/uL   Neutrophils Relative % 76 %   Neutro Abs 8.7 (H) 1.7 - 7.7  K/uL   Lymphocytes Relative 19 %   Lymphs Abs 2.1 0.7 - 4.0 K/uL   Monocytes Relative 4 %   Monocytes Absolute 0.5 0.1 - 1.0 K/uL   Eosinophils Relative 1 %   Eosinophils Absolute 0.1 0.0 - 0.7 K/uL   Basophils Relative 0 %   Basophils Absolute 0.0 0.0 - 0.1 K/uL   Ct Abdomen Pelvis W Contrast Result Date: 11/13/2016 CLINICAL DATA:  Acute generalized abdominal pain. EXAM: CT ABDOMEN AND PELVIS WITH CONTRAST TECHNIQUE: Multidetector CT imaging of the abdomen and pelvis was performed using the standard protocol following bolus administration of intravenous contrast. CONTRAST:  148mL ISOVUE-300 IOPAMIDOL (ISOVUE-300) INJECTION 61% COMPARISON:  CT scan of June 28, 2015. FINDINGS: Lower chest: No acute abnormality. Hepatobiliary: No focal liver abnormality is seen. Status post cholecystectomy. No biliary dilatation. Pancreas: Unremarkable. No pancreatic ductal dilatation or surrounding inflammatory changes. Spleen: Normal in size without focal abnormality. Adrenals/Urinary Tract: Adrenal glands are unremarkable. No hydronephrosis or renal obstruction is noted. 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 Urinary bladder is unremarkable. Stomach/Bowel: The appendix is not clearly identified, but no inflammation is seen in the right lower quadrant. No colonic dilatation is noted. Dilated small bowel loops are noted in the left lower quadrant and pelvis with fecalization noted. Transition zone is noted in the right side of the pelvis best seen on image number 69 of series 2. The small bowel beyond the transition zone is nondilated, but demonstrates possible enhancing walls suggesting inflammation. Vascular/Lymphatic: Aortic atherosclerosis. No enlarged abdominal or pelvic lymph nodes. Reproductive: Status post hysterectomy. No adnexal masses. Other: No abdominal wall hernia or abnormality. No abdominopelvic ascites. Musculoskeletal: No  acute or significant osseous findings. IMPRESSION: Dilated small bowel loops are noted in the left lower quadrant of the abdomen and pelvis with fecalization, secondary to transition  zone seen in the right pelvis. The ileum beyond the transition zone is nondilated but did demonstrate possible wall enhancement, suggesting active inflammation. It is uncertain if this partial obstruction may be due to a adhesion or inflammatory bowel disease such as Crohn's disease. Clinical correlation is recommended. Aortic atherosclerosis. Electronically Signed   By: Marijo Conception, M.D.   On: 11/13/2016 15:08    1535:  Recurrent SBO. Dx and testing d/w pt.   Questions answered.  Verb understanding, agreeable to admit.  T/C to [facility] Surgery Dr. Arnoldo Morale, case discussed, including:  HPI, pertinent PM/SHx, VS/PE, dx testing, ED course and treatment:  Agreeable to consult. T/C to [facility] Dr. Lorin Mercy, case discussed, including:  HPI, pertinent PM/SHx, VS/PE, dx testing, ED course and treatment:  Agreeable to admit.   Final Clinical Impressions(s) / ED Diagnoses   Final diagnoses:  None    New Prescriptions New Prescriptions   No medications on file     Francine Graven, DO 11/15/16 2156

## 2016-11-13 NOTE — ED Notes (Signed)
Patient transported to CT 

## 2016-11-14 DIAGNOSIS — Z7982 Long term (current) use of aspirin: Secondary | ICD-10-CM | POA: Diagnosis not present

## 2016-11-14 DIAGNOSIS — Z9114 Patient's other noncompliance with medication regimen: Secondary | ICD-10-CM | POA: Diagnosis not present

## 2016-11-14 DIAGNOSIS — F17200 Nicotine dependence, unspecified, uncomplicated: Secondary | ICD-10-CM | POA: Diagnosis not present

## 2016-11-14 DIAGNOSIS — K56609 Unspecified intestinal obstruction, unspecified as to partial versus complete obstruction: Secondary | ICD-10-CM | POA: Diagnosis not present

## 2016-11-14 DIAGNOSIS — I5022 Chronic systolic (congestive) heart failure: Secondary | ICD-10-CM | POA: Diagnosis not present

## 2016-11-14 DIAGNOSIS — R7302 Impaired glucose tolerance (oral): Secondary | ICD-10-CM

## 2016-11-14 DIAGNOSIS — Z66 Do not resuscitate: Secondary | ICD-10-CM | POA: Diagnosis not present

## 2016-11-14 DIAGNOSIS — R001 Bradycardia, unspecified: Secondary | ICD-10-CM | POA: Diagnosis not present

## 2016-11-14 DIAGNOSIS — E038 Other specified hypothyroidism: Secondary | ICD-10-CM | POA: Diagnosis not present

## 2016-11-14 DIAGNOSIS — K567 Ileus, unspecified: Secondary | ICD-10-CM | POA: Diagnosis not present

## 2016-11-14 DIAGNOSIS — Z79899 Other long term (current) drug therapy: Secondary | ICD-10-CM | POA: Diagnosis not present

## 2016-11-14 DIAGNOSIS — K56600 Partial intestinal obstruction, unspecified as to cause: Secondary | ICD-10-CM | POA: Diagnosis not present

## 2016-11-14 DIAGNOSIS — Z833 Family history of diabetes mellitus: Secondary | ICD-10-CM | POA: Diagnosis not present

## 2016-11-14 DIAGNOSIS — Z8249 Family history of ischemic heart disease and other diseases of the circulatory system: Secondary | ICD-10-CM | POA: Diagnosis not present

## 2016-11-14 DIAGNOSIS — E039 Hypothyroidism, unspecified: Secondary | ICD-10-CM | POA: Diagnosis not present

## 2016-11-14 DIAGNOSIS — K56691 Other complete intestinal obstruction: Secondary | ICD-10-CM | POA: Diagnosis not present

## 2016-11-14 DIAGNOSIS — R1110 Vomiting, unspecified: Secondary | ICD-10-CM | POA: Diagnosis not present

## 2016-11-14 DIAGNOSIS — R739 Hyperglycemia, unspecified: Secondary | ICD-10-CM | POA: Diagnosis not present

## 2016-11-14 DIAGNOSIS — F17290 Nicotine dependence, other tobacco product, uncomplicated: Secondary | ICD-10-CM | POA: Diagnosis not present

## 2016-11-14 DIAGNOSIS — E785 Hyperlipidemia, unspecified: Secondary | ICD-10-CM | POA: Diagnosis not present

## 2016-11-14 DIAGNOSIS — K5651 Intestinal adhesions [bands], with partial obstruction: Secondary | ICD-10-CM | POA: Diagnosis not present

## 2016-11-14 DIAGNOSIS — I10 Essential (primary) hypertension: Secondary | ICD-10-CM | POA: Diagnosis not present

## 2016-11-14 DIAGNOSIS — I110 Hypertensive heart disease with heart failure: Secondary | ICD-10-CM | POA: Diagnosis not present

## 2016-11-14 LAB — BASIC METABOLIC PANEL
Anion gap: 7 (ref 5–15)
BUN: 8 mg/dL (ref 6–20)
CHLORIDE: 106 mmol/L (ref 101–111)
CO2: 26 mmol/L (ref 22–32)
Calcium: 8.6 mg/dL — ABNORMAL LOW (ref 8.9–10.3)
Creatinine, Ser: 0.69 mg/dL (ref 0.44–1.00)
GFR calc Af Amer: >60 mL/min (ref >60)
GFR calc non Af Amer: >60 mL/min (ref >60)
GLUCOSE: 121 mg/dL — AB (ref 65–99)
Potassium: 3.5 mmol/L (ref 3.5–5.1)
Sodium: 139 mmol/L (ref 135–145)

## 2016-11-14 LAB — CBC
HEMATOCRIT: 38.2 % (ref 36.0–46.0)
Hemoglobin: 12.6 g/dL (ref 12.0–15.0)
MCH: 30.4 pg (ref 26.0–34.0)
MCHC: 33 g/dL (ref 30.0–36.0)
MCV: 92 fL (ref 78.0–100.0)
Platelets: 307 10*3/uL (ref 150–400)
RBC: 4.15 MIL/uL (ref 3.87–5.11)
RDW: 14.5 % (ref 11.5–15.5)
WBC: 12.1 10*3/uL — ABNORMAL HIGH (ref 4.0–10.5)

## 2016-11-14 LAB — MAGNESIUM: MAGNESIUM: 1.8 mg/dL (ref 1.7–2.4)

## 2016-11-14 MED ORDER — BISACODYL 10 MG RE SUPP
10.0000 mg | Freq: Two times a day (BID) | RECTAL | Status: DC
  Administered 2016-11-14 – 2016-11-16 (×3): 10 mg via RECTAL
  Filled 2016-11-14 (×5): qty 1

## 2016-11-14 MED ORDER — LEVOTHYROXINE SODIUM 100 MCG IV SOLR
37.5000 ug | Freq: Every day | INTRAVENOUS | Status: DC
  Filled 2016-11-14 (×2): qty 5

## 2016-11-14 MED ORDER — LEVOTHYROXINE SODIUM 100 MCG IV SOLR
37.5000 ug | Freq: Every day | INTRAVENOUS | Status: DC
  Administered 2016-11-14 – 2016-11-16 (×3): 37.5 ug via INTRAVENOUS
  Filled 2016-11-14 (×5): qty 5

## 2016-11-14 MED ORDER — POTASSIUM CHLORIDE 2 MEQ/ML IV SOLN: INTRAVENOUS | Status: DC

## 2016-11-14 MED ORDER — POTASSIUM CHLORIDE IN NACL 20-0.9 MEQ/L-% IV SOLN
INTRAVENOUS | Status: DC
Start: 2016-11-14 — End: 2016-11-16
  Administered 2016-11-14 – 2016-11-16 (×3): via INTRAVENOUS

## 2016-11-14 MED ORDER — HYDRALAZINE HCL 20 MG/ML IJ SOLN: 10.0000 mg | Freq: Four times a day (QID) | INTRAMUSCULAR | Status: DC | PRN

## 2016-11-14 NOTE — Consult Note (Signed)
Reason for Consult: Small bowel obstruction Referring Physician: Dr. Baldemar Friday Adrienne [name] is an 71 y.o. female.  HPI: Patient is a 71 year old white female who yesterday morning developed abdominal pain in the periumbilical region. She did have nausea but no vomiting. She denied a diarrhea. She did have a hard bowel movement yesterday morning. No fever chills have been noted. She presented to the emergency room for further evaluation treatment. CT scan of the abdomen revealed possible small bowel obstruction. The patient was last admitted with a bowel obstruction in January 2017 and this was managed conservatively without any NG tube. She did not require surgery. She has had both a cholecystectomy and hysterectomy in the past. This morning, she has mild abdominal pain. She has not had a bowel movement or flatus since admission. She does have some nausea.  Past Medical History:  Diagnosis Date  . CHF (congestive heart failure) (HCC)    EF 45-50%  . Hyperlipidemia   . Hypertension   . Knee pain   . SBO (small bowel obstruction) ([place])   . Shoulder pain   . Smoker   . Thyroid disease     Past Surgical History:  Procedure Laterality Date  . ABDOMINAL HYSTERECTOMY  1986  . BACK SURGERY    . CHOLECYSTECTOMY    . polypectomy-oropharynx    . right knee meniscus      Family History  Problem Relation Age of Onset  . Cancer Sister   . Diabetes Brother   . Diabetes Brother   . Hypertension Other     Social History:  reports that she has been smoking E-cigarettes.  She has a 53.00 pack-year smoking history. She has never used smokeless tobacco. She reports that she does not drink alcohol or use drugs.  Allergies:  Allergies  Allergen Reactions  . Lisinopril Swelling    Angioedema  . Atorvastatin Other (See Comments)    Muscle aches      Medications:  Scheduled: . aspirin EC  81 mg Oral Daily  . levothyroxine  37.5 mcg Intravenous QAC breakfast    Results for orders placed or  performed during the hospital encounter of 11/13/16 (from the past 48 hour(s))  Urinalysis, Routine w reflex microscopic     Status: None   Collection Time: 11/13/16 11:56 AM  Result Value Ref Range   Color, Urine YELLOW YELLOW   APPearance CLEAR CLEAR   Specific Gravity, Urine 1.014 1.005 - 1.030   pH 6.0 5.0 - 8.0   Glucose, UA NEGATIVE NEGATIVE mg/dL   Hgb urine dipstick NEGATIVE NEGATIVE   Bilirubin Urine NEGATIVE NEGATIVE   Ketones, ur NEGATIVE NEGATIVE mg/dL   Protein, ur NEGATIVE NEGATIVE mg/dL   Nitrite NEGATIVE NEGATIVE   Leukocytes, UA NEGATIVE NEGATIVE  Lipase, blood     Status: None   Collection Time: 11/13/16 12:04 PM  Result Value Ref Range   Lipase 23 11 - 51 U/L  Comprehensive metabolic panel     Status: Abnormal   Collection Time: 11/13/16 12:04 PM  Result Value Ref Range   Sodium 140 135 - 145 mmol/L   Potassium 4.3 3.5 - 5.1 mmol/L   Chloride 105 101 - 111 mmol/L   CO2 28 22 - 32 mmol/L   Glucose, Bld 129 (H) 65 - 99 mg/dL   BUN 11 6 - 20 mg/dL   Creatinine, Ser 0.84 0.44 - 1.00 mg/dL   Calcium 9.5 8.9 - 10.3 mg/dL   Total Protein 7.7 6.5 - 8.1 g/dL  Albumin 3.9 3.5 - 5.0 g/dL   AST 23 15 - 41 U/L   ALT 19 14 - 54 U/L   Alkaline Phosphatase 71 38 - 126 U/L   Total Bilirubin 0.7 0.3 - 1.2 mg/dL   GFR calc non Af Amer >60 >60 mL/min   GFR calc Af Amer >60 >60 mL/min    Comment: (NOTE) The eGFR has been calculated using the CKD EPI equation. This calculation has not been validated in all clinical situations. eGFR's persistently <60 mL/min signify possible Chronic Kidney Disease.    Anion gap 7 5 - 15  CBC with Differential     Status: Abnormal   Collection Time: 11/13/16 12:25 PM  Result Value Ref Range   WBC 11.3 (H) 4.0 - 10.5 K/uL   RBC 4.27 3.87 - 5.11 MIL/uL   Hemoglobin 13.1 12.0 - 15.0 g/dL   HCT 39.1 36.0 - 46.0 %   MCV 91.6 78.0 - 100.0 fL   MCH 30.7 26.0 - 34.0 pg   MCHC 33.5 30.0 - 36.0 g/dL   RDW 14.3 11.5 - 15.5 %   Platelets 307  150 - 400 K/uL   Neutrophils Relative % 76 %   Neutro Abs 8.7 (H) 1.7 - 7.7 K/uL   Lymphocytes Relative 19 %   Lymphs Abs 2.1 0.7 - 4.0 K/uL   Monocytes Relative 4 %   Monocytes Absolute 0.5 0.1 - 1.0 K/uL   Eosinophils Relative 1 %   Eosinophils Absolute 0.1 0.0 - 0.7 K/uL   Basophils Relative 0 %   Basophils Absolute 0.0 0.0 - 0.1 K/uL  Basic metabolic panel     Status: Abnormal   Collection Time: 11/14/16  6:02 AM  Result Value Ref Range   Sodium 139 135 - 145 mmol/L   Potassium 3.5 3.5 - 5.1 mmol/L    Comment: DELTA CHECK NOTED   Chloride 106 101 - 111 mmol/L   CO2 26 22 - 32 mmol/L   Glucose, Bld 121 (H) 65 - 99 mg/dL   BUN 8 6 - 20 mg/dL   Creatinine, Ser 0.69 0.44 - 1.00 mg/dL   Calcium 8.6 (L) 8.9 - 10.3 mg/dL   GFR calc non Af Amer >60 >60 mL/min   GFR calc Af Amer >60 >60 mL/min    Comment: (NOTE) The eGFR has been calculated using the CKD EPI equation. This calculation has not been validated in all clinical situations. eGFR's persistently <60 mL/min signify possible Chronic Kidney Disease.    Anion gap 7 5 - 15  CBC     Status: Abnormal   Collection Time: 11/14/16  6:02 AM  Result Value Ref Range   WBC 12.1 (H) 4.0 - 10.5 K/uL   RBC 4.15 3.87 - 5.11 MIL/uL   Hemoglobin 12.6 12.0 - 15.0 g/dL   HCT 38.2 36.0 - 46.0 %   MCV 92.0 78.0 - 100.0 fL   MCH 30.4 26.0 - 34.0 pg   MCHC 33.0 30.0 - 36.0 g/dL   RDW 14.5 11.5 - 15.5 %   Platelets 307 150 - 400 K/uL  Magnesium     Status: None   Collection Time: 11/14/16  6:02 AM  Result Value Ref Range   Magnesium 1.8 1.7 - 2.4 mg/dL    Ct Abdomen Pelvis W Contrast  Result Date: 11/13/2016 CLINICAL DATA:  Acute generalized abdominal pain. EXAM: CT ABDOMEN AND PELVIS WITH CONTRAST TECHNIQUE: Multidetector CT imaging of the abdomen and pelvis was performed using the standard protocol following  bolus administration of intravenous contrast. CONTRAST:  166m ISOVUE-300 IOPAMIDOL (ISOVUE-300) INJECTION 61% COMPARISON:  CT  scan of June 28, 2015. FINDINGS: Lower chest: No acute abnormality. Hepatobiliary: No focal liver abnormality is seen. Status post cholecystectomy. No biliary dilatation. Pancreas: Unremarkable. No pancreatic ductal dilatation or surrounding inflammatory changes. Spleen: Normal in size without focal abnormality. Adrenals/Urinary Tract: Adrenal glands are unremarkable. No hydronephrosis or renal obstruction is noted. 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 Urinary bladder is unremarkable. Stomach/Bowel: The appendix is not clearly identified, but no inflammation is seen in the right lower quadrant. No colonic dilatation is noted. Dilated small bowel loops are noted in the left lower quadrant and pelvis with fecalization noted. Transition zone is noted in the right side of the pelvis best seen on image number 69 of series 2. The small bowel beyond the transition zone is nondilated, but demonstrates possible enhancing walls suggesting inflammation. Vascular/Lymphatic: Aortic atherosclerosis. No enlarged abdominal or pelvic lymph nodes. Reproductive: Status post hysterectomy. No adnexal masses. Other: No abdominal wall hernia or abnormality. No abdominopelvic ascites. Musculoskeletal: No acute or significant osseous findings. IMPRESSION: Dilated small bowel loops are noted in the left lower quadrant of the abdomen and pelvis with fecalization, secondary to transition zone seen in the right pelvis. The ileum beyond the transition zone is nondilated but did demonstrate possible wall enhancement, suggesting active inflammation. It is uncertain if this partial obstruction may be due to a adhesion or inflammatory bowel disease such as Crohn's disease. Clinical correlation is recommended. Aortic atherosclerosis. Electronically Signed   By: JMarijo Conception M.D.   On: 11/13/2016 15:08    ROS:  Pertinent items noted in HPI and remainder of  comprehensive ROS otherwise negative.  Blood pressure (!) 131/58, pulse (!) 44, temperature 98.2 F (36.8 C), temperature source Oral, resp. rate 18, height _0  (1.6 m), weight 170 lb (77.1 kg), SpO2 97 %. Physical Exam: Pleasant well-developed well-nourished black female in no acute distress. Head is normocephalic, atraumatic Neck is supple without lymphadenopathy or JVD. Lungs clear auscultation with equal breath sounds bilaterally Heart examination reveals a regular rate and rhythm without S3, S4, murmurs Abdomen is soft with slight distention noted. No point tenderness is noted. No rigidity is noted. No hernias are noted. Minimal bowel sounds appreciated. Scan report reviewed. H&P reviewed.  Assessment/Plan: Impression: Small bowel obstruction, most likely secondary to adhesive disease. No need for acute surgical intervention at this time. We will try bowel rest and cathartics. Will follow with you.  MAviva Signs5/24/2018, 11:02 AM

## 2016-11-14 NOTE — Progress Notes (Signed)
PROGRESS NOTE  Adrienne [name] RKY:[id number] DOB: 12/27/1945 DOA: 11/13/2016 PCP: Orlena Sheldon, PA-C  Brief History:  71 year old female with a history of systolic CHF, hyperlipidemia, hypertension, tobacco abuse, hypothyroidism, and small bowel obstruction presented with one-day history of abdominal pain that began on the morning of 11/13/2016. She stated that her pain was periumbilical and suprapubic without any vomiting. Her last bowel movement was on the morning of 11/13/2016. She denies any fevers, chills, chest pain, shortness of breath, hematochezia, melena, dysuria, hematuria. In the emergency department, CT of the abdomen and pelvis revealed dilated small bowel loops in the left lower quadrant with equalization with transition zone in the right side of the pelvis. There was some possible enhancement of the small bowel walls post-transition zone. Due to concerns of small bowel obstruction, the patient was admitted for further evaluation.  The patient had an admission from 06/28/2015 through 06/30/2015 for small bowel obstruction which resolved without any surgical intervention.  Assessment/Plan: Small bowel Obstruction -11/13/2016-- revealed dilated small bowel loops in the left lower quadrant with equalization with transition zone in the right side of the pelvis. There was some possible enhancement of the small bowel walls post-transition zone. -General surgery consulted -11/14/16--vomit x 1 -NG tube decompression if continued vomiting--presently refuses -NPO -IVF--add KCl to IVF -am BMP  Essential hypertension -Well-documented noncompliance with medications at home -Hydralazine when necessary SBP >315  Chronic systolic CHF -17/61/6073 echo EF 55-60%, no WMA, trivial MR -Daily weights -Clinically compensated  Impaired glucose tolerance -Repeat hemoglobin A1c -06/28/2015 hemoglobin A1c 6.1  Tobacco abuse -Tobacco cessation discussed  Hypothyroidism -10/28/2016  TSH 3.07 -Switch levothyroxine to IV  Sinus bradycardia -This has been a chronic problem for the patient -Remained stable -Monitor clinically    Disposition Plan:   Home in 2-3 days  Family Communication:  No Family at bedside--Total time spent 35 minutes.  Greater than 50% spent face to face counseling and coordinating care.   Consultants:  General surgery--Jenkins  Code Status:  DNR  DVT Prophylaxis:  [place] Lovenox   Procedures: As Listed in Progress Note Above  Antibiotics: None    Subjective: Patient had an episode of emesis this morning without blood. She feels that her abdominal pain is low but better than yesterday. Denies fevers, chills, chest pain shortness breath, dysuria, hematuria. No flatus. No bowel movement.  Objective: Vitals:   11/13/16 1800 11/13/16 1858 11/13/16 2125 11/14/16 0638  BP: (!) 164/78 (!) 168/66 (!) 172/87 (!) 131/58  Pulse: (!) 48 (!) 41 (!) 46 (!) 44  Resp: 18 18 18 18   Temp:  98.4 F (36.9 C) 98.4 F (36.9 C) 98.2 F (36.8 C)  TempSrc:  Oral Oral Oral  SpO2: 98% 97% 98% 97%  Weight:      Height:       No intake or output data in the 24 hours ending 11/14/16 0724 Weight change:  Exam:   General:  Pt is alert, follows commands appropriately, not in acute distress  HEENT: No icterus, No thrush, No neck mass, [place]/AT  Cardiovascular: RRR, S1/S2, no rubs, no gallops  Respiratory: CTA bilaterally, no wheezing, no crackles, no rhonchi  Abdomen: Soft/+BS, non tender, non distended, no guarding  Extremities: No edema, No lymphangitis, No petechiae, No rashes, no synovitis   Data Reviewed: I have personally reviewed following labs and imaging studies Basic Metabolic Panel:  Recent Labs Lab 11/13/16 1204 11/14/16 0602  NA 140 139  K  4.3 3.5  CL 105 106  CO2 28 26  GLUCOSE 129* 121*  BUN 11 8  CREATININE 0.84 0.69  CALCIUM 9.5 8.6*  MG  --  1.8   Liver Function Tests:  Recent Labs Lab 11/13/16 1204  AST 23  ALT  19  ALKPHOS 71  BILITOT 0.7  PROT 7.7  ALBUMIN 3.9    Recent Labs Lab 11/13/16 1204  LIPASE 23   No results for input(s): AMMONIA in the last 168 hours. Coagulation Profile: No results for input(s): INR, PROTIME in the last 168 hours. CBC:  Recent Labs Lab 11/13/16 1225 11/14/16 0602  WBC 11.3* 12.1*  NEUTROABS 8.7*  --   HGB 13.1 12.6  HCT 39.1 38.2  MCV 91.6 92.0  PLT 307 307   Cardiac Enzymes: No results for input(s): CKTOTAL, CKMB, CKMBINDEX, TROPONINI in the last 168 hours. BNP: Invalid input(s): POCBNP CBG: No results for input(s): GLUCAP in the last 168 hours. HbA1C: No results for input(s): HGBA1C in the last 72 hours. Urine analysis:    Component Value Date/Time   COLORURINE YELLOW 11/13/2016 [place] 11/13/2016 1156   LABSPEC 1.014 11/13/2016 1156   PHURINE 6.0 11/13/2016 1156   GLUCOSEU NEGATIVE 11/13/2016 1156   HGBUR NEGATIVE 11/13/2016 [place] 11/13/2016 [place] 11/13/2016 [place] 11/13/2016 1156   NITRITE NEGATIVE 11/13/2016 1156   LEUKOCYTESUR NEGATIVE 11/13/2016 1156   Sepsis Labs: @LABRCNTIP (procalcitonin:4,lacticidven:4) )No results found for this or any previous visit (from the past 240 hour(s)).   Scheduled Meds: . aspirin EC  81 mg Oral Daily  . levothyroxine  37.5 mcg Intravenous Daily   Continuous Infusions: . sodium chloride 100 mL/hr at 11/13/16 1534    Procedures/Studies: Ct Abdomen Pelvis W Contrast  Result Date: 11/13/2016 CLINICAL DATA:  Acute generalized abdominal pain. EXAM: CT ABDOMEN AND PELVIS WITH CONTRAST TECHNIQUE: Multidetector CT imaging of the abdomen and pelvis was performed using the standard protocol following bolus administration of intravenous contrast. CONTRAST:  140mL ISOVUE-300 IOPAMIDOL (ISOVUE-300) INJECTION 61% COMPARISON:  CT scan of June 28, 2015. FINDINGS: Lower chest: No acute abnormality. Hepatobiliary: No focal liver  abnormality is seen. Status post cholecystectomy. No biliary dilatation. Pancreas: Unremarkable. No pancreatic ductal dilatation or surrounding inflammatory changes. Spleen: Normal in size without focal abnormality. Adrenals/Urinary Tract: Adrenal glands are unremarkable. No hydronephrosis or renal obstruction is noted. 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 Urinary bladder is unremarkable. Stomach/Bowel: The appendix is not clearly identified, but no inflammation is seen in the right lower quadrant. No colonic dilatation is noted. Dilated small bowel loops are noted in the left lower quadrant and pelvis with fecalization noted. Transition zone is noted in the right side of the pelvis best seen on image number 69 of series 2. The small bowel beyond the transition zone is nondilated, but demonstrates possible enhancing walls suggesting inflammation. Vascular/Lymphatic: Aortic atherosclerosis. No enlarged abdominal or pelvic lymph nodes. Reproductive: Status post hysterectomy. No adnexal masses. Other: No abdominal wall hernia or abnormality. No abdominopelvic ascites. Musculoskeletal: No acute or significant osseous findings. IMPRESSION: Dilated small bowel loops are noted in the left lower quadrant of the abdomen and pelvis with fecalization, secondary to transition zone seen in the right pelvis. The ileum beyond the transition zone is nondilated but did demonstrate possible wall enhancement, suggesting active inflammation. It is uncertain if this partial obstruction may  be due to a adhesion or inflammatory bowel disease such as Crohn's disease. Clinical correlation is recommended. Aortic atherosclerosis. Electronically Signed   By: Marijo Conception, M.D.   On: 11/13/2016 15:08    Vere Diantonio, DO  [facility] Pager [phone]  If 7PM-7AM, please contact night-coverage [email or web address] Password [email or web address] 11/14/2016, 7:24 AM    LOS: 0 days

## 2016-11-14 NOTE — Care Management Obs Status (Signed)
[facility] NOTIFICATION   Patient Details  Name: Adrienne [name] MRN: [id number] Date of Birth: Jan 16, 1946   Medicare Observation Status Notification Given:  Yes    Carlye Panameno, Chauncey [place], RN 11/14/2016, 10:54 AM

## 2016-11-14 NOTE — Care Management Note (Signed)
Case Management Note  Patient Details  Name: Adrienne [name] MRN: [id number] Date of Birth: 1946-04-14  Subjective/Objective:     Adm from home with SBO. From home with daughter, ind PTA. No HH or DME. Has PCP, drivers herself to appointments, and reports no issus affording medications.                Action/Plan: Plans to return home at time of discharge with self care.   Expected Discharge Date:  11/16/16               Expected Discharge Plan:  Home/Self Care  In-House Referral:     Discharge planning Services  CM Consult  Post Acute Care Choice:    Choice offered to:     DME Arranged:    DME Agency:     HH Arranged:    HH Agency:     Status of Service:  In process, will continue to follow  If discussed at Long Length of Stay Meetings, dates discussed:    Additional Comments:  Hans Rusher, Chauncey [place], RN 11/14/2016, 10:55 AM

## 2016-11-15 DIAGNOSIS — R739 Hyperglycemia, unspecified: Secondary | ICD-10-CM

## 2016-11-15 LAB — CBC
HCT: 38.7 % (ref 36.0–46.0)
Hemoglobin: 13 g/dL (ref 12.0–15.0)
MCH: 30.8 pg (ref 26.0–34.0)
MCHC: 33.6 g/dL (ref 30.0–36.0)
MCV: 91.7 fL (ref 78.0–100.0)
Platelets: 314 10*3/uL (ref 150–400)
RBC: 4.22 MIL/uL (ref 3.87–5.11)
RDW: 14.2 % (ref 11.5–15.5)
WBC: 10.7 10*3/uL — AB (ref 4.0–10.5)

## 2016-11-15 LAB — BASIC METABOLIC PANEL
Anion gap: 10 (ref 5–15)
BUN: 8 mg/dL (ref 6–20)
CO2: 25 mmol/L (ref 22–32)
Calcium: 8.8 mg/dL — ABNORMAL LOW (ref 8.9–10.3)
Chloride: 102 mmol/L (ref 101–111)
Creatinine, Ser: 0.72 mg/dL (ref 0.44–1.00)
GFR calc Af Amer: >60 mL/min (ref >60)
GFR calc non Af Amer: >60 mL/min (ref >60)
Glucose, Bld: 114 mg/dL — ABNORMAL HIGH (ref 65–99)
Potassium: 3.6 mmol/L (ref 3.5–5.1)
SODIUM: 137 mmol/L (ref 135–145)

## 2016-11-15 LAB — MAGNESIUM: Magnesium: 1.8 mg/dL (ref 1.7–2.4)

## 2016-11-15 MED ORDER — POLYETHYLENE GLYCOL 3350 17 G PO PACK
17.0000 g | PACK | Freq: Two times a day (BID) | ORAL | Status: DC
  Administered 2016-11-15 – 2016-11-16 (×3): 17 g via ORAL
  Filled 2016-11-15 (×3): qty 1

## 2016-11-15 NOTE — Progress Notes (Signed)
PROGRESS NOTE  Adrienne [name] [id number]:[id number] DOB: [birth year] DOA: 11/13/2016 PCP: Orlena Sheldon, PA-C  Brief History:  71 year old female with a history of systolic CHF, hyperlipidemia, hypertension, tobacco abuse, hypothyroidism, and small bowel obstruction presented with one-day history of abdominal pain that began on the morning of 11/13/2016. She stated that her pain was periumbilical and suprapubic without any vomiting. Her last bowel movement was on the morning of 11/13/2016. She denies any fevers, chills, chest pain, shortness of breath, hematochezia, melena, dysuria, hematuria. In the emergency department, CT of the abdomen and pelvis revealed dilated small bowel loops in the left lower quadrant with equalization with transition zone in the right side of the pelvis. There was some possible enhancement of the small bowel walls post-transition zone. Due to concerns of small bowel obstruction, the patient was admitted for further evaluation.  The patient had an admission from 06/28/2015 through 06/30/2015 for small bowel obstruction which resolved without any surgical intervention.  Assessment/Plan: Small bowel Obstruction -11/13/2016-- revealed dilated small bowel loops in the left lower quadrant with equalization with transition zone in the right side of the pelvis. There was some possible enhancement of the small bowel walls post-transition zone. -General surgery consult appreciated -11/14/16--vomit x 1 -NG tube decompression if starts vomiting again -NPO-->full liquid diet per surgery -abd xray in am -IVF--add KCl to IVF -am BMP -increase activity  Essential hypertension -Well-documented noncompliance with medications at home -Hydralazine when necessary SBP >180 -restart amlodipine when able to tolerate po reliably  Chronic systolic CHF -56/81/2751 echo EF 55-60%, no WMA, trivial MR -Daily weights -Clinically compensated  Impaired glucose tolerance -Repeat  hemoglobin A1c--pending -06/28/2015 hemoglobin A1c 6.1  Tobacco abuse -Tobacco cessation discussed  Hypothyroidism -10/28/2016 TSH 3.07 -Switch levothyroxine to IV  Sinus bradycardia -This has been a chronic problem for the patient -Remained stable -Monitor clinically    Disposition Plan:   Home in 1-2  days  Family Communication:  Daughter updated at bedside  Consultants:  General surgery--Jenkins  Code Status:  DNR  DVT Prophylaxis:  [place] Lovenox   Procedures: As Listed in Progress Note Above  Antibiotics: None    Subjective: Patient continues to complain of abdominal pain. She had a bowel movement on 11/14/2016 evening. Denies any vomiting but complains of some nausea. Denies any fevers, chills, chest pain, coughing, hemoptysis, diarrhea, medication, melena. There is no dysuria or hematuria. No rashes.  Objective: Vitals:   11/14/16 2100 11/14/16 2136 11/15/16 0609 11/15/16 1300  BP:  (!) 172/81 (!) 157/73 (!) 155/74  Pulse:  64 (!) 46 (!) 53  Resp:   18 18  Temp:   98.5 F (36.9 C) 98.8 F (37.1 C)  TempSrc:   Oral Oral  SpO2:   96% 100%  Weight: 77.1 kg (170 lb)  76.8 kg (169 lb 5 oz)   Height:        Intake/Output Summary (Last 24 hours) at 11/15/16 1830 Last data filed at 11/15/16 1700  Gross per 24 hour  Intake              960 ml  Output             1900 ml  Net             -940 ml   Weight change: 0 kg (0 lb) Exam:   General:  Pt is alert, follows commands appropriately, not in acute distress  HEENT: No icterus, No thrush,  No neck mass, [place]/AT  Cardiovascular: RRR, S1/S2, no rubs, no gallops  Respiratory: CTA bilaterally, no wheezing, no crackles, no rhonchi  Abdomen: Soft/+BS, periumbilical tender, non distended, no guarding  Extremities: No edema, No lymphangitis, No petechiae, No rashes, no synovitis   Data Reviewed: I have personally reviewed following labs and imaging studies Basic Metabolic Panel:  Recent  Labs Lab 11/13/16 1204 11/14/16 0602 11/15/16 0608  NA 140 139 137  K 4.3 3.5 3.6  CL 105 106 102  CO2 28 26 25   GLUCOSE 129* 121* 114*  BUN 11 8 8   CREATININE 0.84 0.69 0.72  CALCIUM 9.5 8.6* 8.8*  MG  --  1.8 1.8   Liver Function Tests:  Recent Labs Lab 11/13/16 1204  AST 23  ALT 19  ALKPHOS 71  BILITOT 0.7  PROT 7.7  ALBUMIN 3.9    Recent Labs Lab 11/13/16 1204  LIPASE 23   No results for input(s): AMMONIA in the last 168 hours. Coagulation Profile: No results for input(s): INR, PROTIME in the last 168 hours. CBC:  Recent Labs Lab 11/13/16 1225 11/14/16 0602 11/15/16 0608  WBC 11.3* 12.1* 10.7*  NEUTROABS 8.7*  --   --   HGB 13.1 12.6 13.0  HCT 39.1 38.2 38.7  MCV 91.6 92.0 91.7  PLT 307 307 314   Cardiac Enzymes: No results for input(s): CKTOTAL, CKMB, CKMBINDEX, TROPONINI in the last 168 hours. BNP: Invalid input(s): POCBNP CBG: No results for input(s): GLUCAP in the last 168 hours. HbA1C: No results for input(s): HGBA1C in the last 72 hours. Urine analysis:    Component Value Date/Time   COLORURINE YELLOW 11/13/2016 [place] 11/13/2016 1156   LABSPEC 1.014 11/13/2016 1156   PHURINE 6.0 11/13/2016 1156   GLUCOSEU NEGATIVE 11/13/2016 [place] 11/13/2016 [place] 11/13/2016 [place] 11/13/2016 1156   PROTEINUR NEGATIVE 11/13/2016 1156   NITRITE NEGATIVE 11/13/2016 1156   LEUKOCYTESUR NEGATIVE 11/13/2016 1156   Sepsis Labs: @LABRCNTIP (procalcitonin:4,lacticidven:4) )No results found for this or any previous visit (from the past 240 hour(s)).   Scheduled Meds: . aspirin EC  81 mg Oral Daily  . bisacodyl  10 mg Rectal q12n4p  . levothyroxine  37.5 mcg Intravenous QAC breakfast  . polyethylene glycol  17 g Oral BID   Continuous Infusions: . 0.9 % NaCl with KCl 20 mEq / L 75 mL/hr at 11/14/16 2230    Procedures/Studies: Ct Abdomen Pelvis W Contrast  Result Date:  11/13/2016 CLINICAL DATA:  Acute generalized abdominal pain. EXAM: CT ABDOMEN AND PELVIS WITH CONTRAST TECHNIQUE: Multidetector CT imaging of the abdomen and pelvis was performed using the standard protocol following bolus administration of intravenous contrast. CONTRAST:  1103mL ISOVUE-300 IOPAMIDOL (ISOVUE-300) INJECTION 61% COMPARISON:  CT scan of June 28, 2015. FINDINGS: Lower chest: No acute abnormality. Hepatobiliary: No focal liver abnormality is seen. Status post cholecystectomy. No biliary dilatation. Pancreas: Unremarkable. No pancreatic ductal dilatation or surrounding inflammatory changes. Spleen: Normal in size without focal abnormality. Adrenals/Urinary Tract: Adrenal glands are unremarkable. No hydronephrosis or renal obstruction is noted. 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 Urinary bladder is unremarkable. Stomach/Bowel: The appendix is not clearly identified, but no inflammation is seen in the right lower quadrant. No colonic dilatation is noted. Dilated small bowel loops are noted in the left lower quadrant and pelvis with fecalization noted. Transition zone is noted  in the right side of the pelvis best seen on image number 69 of series 2. The small bowel beyond the transition zone is nondilated, but demonstrates possible enhancing walls suggesting inflammation. Vascular/Lymphatic: Aortic atherosclerosis. No enlarged abdominal or pelvic lymph nodes. Reproductive: Status post hysterectomy. No adnexal masses. Other: No abdominal wall hernia or abnormality. No abdominopelvic ascites. Musculoskeletal: No acute or significant osseous findings. IMPRESSION: Dilated small bowel loops are noted in the left lower quadrant of the abdomen and pelvis with fecalization, secondary to transition zone seen in the right pelvis. The ileum beyond the transition zone is nondilated but did demonstrate possible wall enhancement, suggesting  active inflammation. It is uncertain if this partial obstruction may be due to a adhesion or inflammatory bowel disease such as Crohn's disease. Clinical correlation is recommended. Aortic atherosclerosis. Electronically Signed   By: Marijo Conception, M.D.   On: 11/13/2016 15:08    Kitrina Maurin, DO  [facility] Pager [phone]  If 7PM-7AM, please contact night-coverage [email or web address] Password [email or web address] 11/15/2016, 6:30 PM   LOS: 1 day

## 2016-11-15 NOTE — Care Management Important Message (Signed)
Important Message  Patient Details  Name: Adrienne [name] MRN: [id number] Date of Birth: 06-Apr-1946   Medicare Important Message Given:  Yes    Demitrious Mccannon, Chauncey [place], RN 11/15/2016, 12:35 PM

## 2016-11-15 NOTE — Progress Notes (Signed)
  Subjective: Patient did have bowel movement yesterday. She is passing some gas.  Objective: Vital signs in last 24 hours: Temp:  [98.5 F (36.9 C)-98.8 F (37.1 C)] 98.5 F (36.9 C) (05/25 0609) Pulse Rate:  [45-64] 46 (05/25 0609) Resp:  [18-20] 18 (05/25 0609) BP: (157-185)/(72-81) 157/73 (05/25 0609) SpO2:  [96 %-100 %] 96 % (05/25 0609) Weight:  [169 lb 5 oz (76.8 kg)-170 lb (77.1 kg)] 169 lb 5 oz (76.8 kg) (05/25 0609) Last BM Date: 11/13/16  Intake/Output from previous day: 05/24 0701 - 05/25 0700 In: -  Out: 1000 [Urine:1000] Intake/Output this shift: No intake/output data recorded.  General appearance: alert, cooperative and no distress GI: Soft with mild distention noted, but no rigidity. Active bowel sounds appreciated.  Lab Results:   Recent Labs  11/14/16 0602 11/15/16 0608  WBC 12.1* 10.7*  HGB 12.6 13.0  HCT 38.2 38.7  PLT 307 314   BMET  Recent Labs  11/14/16 0602 11/15/16 0608  NA 139 137  K 3.5 3.6  CL 106 102  CO2 26 25  GLUCOSE 121* 114*  BUN 8 8  CREATININE 0.69 0.72  CALCIUM 8.6* 8.8*   PT/INR No results for input(s): LABPROT, INR in the last 72 hours.  Studies/Results: Ct Abdomen Pelvis W Contrast  Result Date: 11/13/2016 CLINICAL DATA:  Acute generalized abdominal pain. EXAM: CT ABDOMEN AND PELVIS WITH CONTRAST TECHNIQUE: Multidetector CT imaging of the abdomen and pelvis was performed using the standard protocol following bolus administration of intravenous contrast. CONTRAST:  187mL ISOVUE-300 IOPAMIDOL (ISOVUE-300) INJECTION 61% COMPARISON:  CT scan of June 28, 2015. FINDINGS: Lower chest: No acute abnormality. Hepatobiliary: No focal liver abnormality is seen. Status post cholecystectomy. No biliary dilatation. Pancreas: Unremarkable. No pancreatic ductal dilatation or surrounding inflammatory changes. Spleen: Normal in size without focal abnormality. Adrenals/Urinary Tract: Adrenal glands are unremarkable. No hydronephrosis  or renal obstruction is noted. 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 Urinary bladder is unremarkable. Stomach/Bowel: The appendix is not clearly identified, but no inflammation is seen in the right lower quadrant. No colonic dilatation is noted. Dilated small bowel loops are noted in the left lower quadrant and pelvis with fecalization noted. Transition zone is noted in the right side of the pelvis best seen on image number 69 of series 2. The small bowel beyond the transition zone is nondilated, but demonstrates possible enhancing walls suggesting inflammation. Vascular/Lymphatic: Aortic atherosclerosis. No enlarged abdominal or pelvic lymph nodes. Reproductive: Status post hysterectomy. No adnexal masses. Other: No abdominal wall hernia or abnormality. No abdominopelvic ascites. Musculoskeletal: No acute or significant osseous findings. IMPRESSION: Dilated small bowel loops are noted in the left lower quadrant of the abdomen and pelvis with fecalization, secondary to transition zone seen in the right pelvis. The ileum beyond the transition zone is nondilated but did demonstrate possible wall enhancement, suggesting active inflammation. It is uncertain if this partial obstruction may be due to a adhesion or inflammatory bowel disease such as Crohn's disease. Clinical correlation is recommended. Aortic atherosclerosis. Electronically Signed   By: Marijo Conception, M.D.   On: 11/13/2016 15:08    Anti-infectives: Anti-infectives    None      Assessment/Plan: Impression: Partial small bowel obstruction, slowly resolving. Plan: We will start MiraLAX. We'll advance to full liquid diet. Encourage ambulation in the hallway.  LOS: 1 day    Aviva Signs 11/15/2016

## 2016-11-16 ENCOUNTER — Inpatient Hospital Stay (HOSPITAL_COMMUNITY): Payer: PPO

## 2016-11-16 ENCOUNTER — Inpatient Hospital Stay (HOSPITAL_COMMUNITY)
Admission: EM | Admit: 2016-11-16 | Discharge: 2016-11-20 | Disposition: A | Discharge status: Home / Self Care | Payer: PPO | Source: Home / Self Care | Attending: Internal Medicine | Admitting: Internal Medicine

## 2016-11-16 ENCOUNTER — Encounter (HOSPITAL_COMMUNITY): Payer: Self-pay | Admitting: *Deleted

## 2016-11-16 DIAGNOSIS — R7302 Impaired glucose tolerance (oral): Secondary | ICD-10-CM | POA: Diagnosis present

## 2016-11-16 DIAGNOSIS — R001 Bradycardia, unspecified: Secondary | ICD-10-CM | POA: Diagnosis present

## 2016-11-16 DIAGNOSIS — Z0189 Encounter for other specified special examinations: Secondary | ICD-10-CM

## 2016-11-16 DIAGNOSIS — I10 Essential (primary) hypertension: Secondary | ICD-10-CM | POA: Diagnosis present

## 2016-11-16 DIAGNOSIS — I5022 Chronic systolic (congestive) heart failure: Secondary | ICD-10-CM | POA: Diagnosis present

## 2016-11-16 DIAGNOSIS — E039 Hypothyroidism, unspecified: Secondary | ICD-10-CM | POA: Diagnosis present

## 2016-11-16 DIAGNOSIS — K56609 Unspecified intestinal obstruction, unspecified as to partial versus complete obstruction: Secondary | ICD-10-CM | POA: Diagnosis present

## 2016-11-16 DIAGNOSIS — E785 Hyperlipidemia, unspecified: Secondary | ICD-10-CM | POA: Diagnosis present

## 2016-11-16 LAB — BASIC METABOLIC PANEL
Anion gap: 11 (ref 5–15)
BUN: 8 mg/dL (ref 6–20)
CALCIUM: 8.9 mg/dL (ref 8.9–10.3)
CO2: 26 mmol/L (ref 22–32)
CREATININE: 0.69 mg/dL (ref 0.44–1.00)
Chloride: 100 mmol/L — ABNORMAL LOW (ref 101–111)
GFR calc Af Amer: >60 mL/min (ref >60)
GLUCOSE: 108 mg/dL — AB (ref 65–99)
Potassium: 3.6 mmol/L (ref 3.5–5.1)
Sodium: 137 mmol/L (ref 135–145)

## 2016-11-16 LAB — CBC
HCT: 39.2 % (ref 36.0–46.0)
Hemoglobin: 13 g/dL (ref 12.0–15.0)
MCH: 30.4 pg (ref 26.0–34.0)
MCHC: 33.2 g/dL (ref 30.0–36.0)
MCV: 91.8 fL (ref 78.0–100.0)
PLATELETS: 325 10*3/uL (ref 150–400)
RBC: 4.27 MIL/uL (ref 3.87–5.11)
RDW: 14 % (ref 11.5–15.5)
WBC: 9.7 10*3/uL (ref 4.0–10.5)

## 2016-11-16 LAB — HEMOGLOBIN A1C
HEMOGLOBIN A1C: 5.9 % — AB (ref 4.8–5.6)
Mean Plasma Glucose: 123 mg/dL

## 2016-11-16 LAB — MAGNESIUM: Magnesium: 1.8 mg/dL (ref 1.7–2.4)

## 2016-11-16 MED ORDER — LEVOTHYROXINE SODIUM 100 MCG IV SOLR
40.0000 ug | Freq: Every day | INTRAVENOUS | Status: DC
  Administered 2016-11-17 – 2016-11-20 (×4): 40 ug via INTRAVENOUS
  Filled 2016-11-16 (×4): qty 5

## 2016-11-16 MED ORDER — METOPROLOL TARTRATE 5 MG/5ML IV SOLN
10.0000 mg | Freq: Once | INTRAVENOUS | Status: DC
Start: 2016-11-16 — End: 2016-11-16
  Filled 2016-11-16: qty 10

## 2016-11-16 MED ORDER — HEPARIN SODIUM (PORCINE) 5000 UNIT/ML IJ SOLN
5000.0000 [IU] | Freq: Three times a day (TID) | INTRAMUSCULAR | Status: DC
  Administered 2016-11-17 – 2016-11-20 (×11): 5000 [IU] via SUBCUTANEOUS
  Filled 2016-11-16 (×11): qty 1

## 2016-11-16 MED ORDER — ONDANSETRON HCL 4 MG PO TABS
4.0000 mg | ORAL_TABLET | Freq: Four times a day (QID) | ORAL | Status: DC | PRN
  Filled 2016-11-16: qty 1

## 2016-11-16 MED ORDER — FAMOTIDINE IN NACL 20-0.9 MG/50ML-% IV SOLN
20.0000 mg | Freq: Two times a day (BID) | INTRAVENOUS | Status: DC
  Administered 2016-11-16 – 2016-11-20 (×8): 20 mg via INTRAVENOUS
  Filled 2016-11-16 (×9): qty 50

## 2016-11-16 MED ORDER — HYDROMORPHONE HCL 1 MG/ML IJ SOLN
0.5000 mg | INTRAMUSCULAR | Status: DC | PRN
  Administered 2016-11-18: 0.5 mg via INTRAVENOUS
  Filled 2016-11-16: qty 1

## 2016-11-16 MED ORDER — ONDANSETRON HCL 4 MG/2ML IJ SOLN
4.0000 mg | Freq: Once | INTRAMUSCULAR | Status: AC
  Administered 2016-11-16: 4 mg via INTRAVENOUS
  Filled 2016-11-16: qty 2

## 2016-11-16 MED ORDER — POTASSIUM CHLORIDE IN NACL 20-0.9 MEQ/L-% IV SOLN
INTRAVENOUS | Status: DC
  Administered 2016-11-16 – 2016-11-17 (×2): via INTRAVENOUS
  Filled 2016-11-16 (×2): qty 1000

## 2016-11-16 MED ORDER — HYDROMORPHONE HCL 1 MG/ML IJ SOLN
0.5000 mg | Freq: Once | INTRAMUSCULAR | Status: AC
  Administered 2016-11-16: 0.5 mg via INTRAVENOUS
  Filled 2016-11-16: qty 1

## 2016-11-16 MED ORDER — LIDOCAINE VISCOUS 2 % MT SOLN
15.0000 mL | Freq: Once | OROMUCOSAL | Status: AC
  Administered 2016-11-16: 15 mL via OROMUCOSAL
  Filled 2016-11-16: qty 15

## 2016-11-16 MED ORDER — HYDRALAZINE HCL 20 MG/ML IJ SOLN
10.0000 mg | Freq: Once | INTRAMUSCULAR | Status: AC
  Administered 2016-11-16: 10 mg via INTRAVENOUS
  Filled 2016-11-16: qty 1

## 2016-11-16 MED ORDER — SODIUM CHLORIDE 0.9 % IV BOLUS (SEPSIS)
1000.0000 mL | Freq: Once | INTRAVENOUS | Status: AC
  Administered 2016-11-16: 1000 mL via INTRAVENOUS

## 2016-11-16 MED ORDER — METOPROLOL TARTRATE 5 MG/5ML IV SOLN
10.0000 mg | Freq: Four times a day (QID) | INTRAVENOUS | Status: DC | PRN
  Administered 2016-11-16: 10 mg via INTRAVENOUS

## 2016-11-16 MED ORDER — MILK AND MOLASSES ENEMA
1.0000 | Freq: Once | RECTAL | Status: AC
  Administered 2016-11-16: 250 mL via RECTAL

## 2016-11-16 MED ORDER — ONDANSETRON HCL 4 MG/2ML IJ SOLN: 4.0000 mg | Freq: Four times a day (QID) | INTRAMUSCULAR | Status: DC | PRN

## 2016-11-16 MED ORDER — MILK AND MOLASSES ENEMA
1.0000 | Freq: Once | RECTAL | Status: DC
  Filled 2016-11-16: qty 250

## 2016-11-16 MED ORDER — PROMETHAZINE HCL 25 MG/ML IJ SOLN
12.5000 mg | INTRAMUSCULAR | Status: DC | PRN
  Administered 2016-11-16: 12.5 mg via INTRAVENOUS
  Filled 2016-11-16: qty 1

## 2016-11-16 MED ORDER — HYDRALAZINE HCL 20 MG/ML IJ SOLN
10.0000 mg | INTRAMUSCULAR | Status: DC | PRN
  Administered 2016-11-17 – 2016-11-19 (×4): 10 mg via INTRAVENOUS
  Filled 2016-11-16 (×4): qty 1

## 2016-11-16 MED ORDER — PROMETHAZINE HCL 25 MG/ML IJ SOLN: 12.5000 mg | INTRAMUSCULAR | Status: DC | PRN

## 2016-11-16 MED ORDER — DIATRIZOATE MEGLUMINE & SODIUM 66-10 % PO SOLN
90.0000 mL | Freq: Once | ORAL | Status: DC
  Filled 2016-11-16: qty 90

## 2016-11-16 NOTE — Discharge Summary (Signed)
Physician Discharge Summary  Adrienne [name] [id number]:[id number] DOB: Aug 24, 1945 DOA: 11/13/2016  PCP: Orlena Sheldon, PA-C  Admit date: 11/13/2016 Discharge date: 11/16/2016  Admitted From:  Home Disposition:  Leaving Against Medical Advice  Recommendations for Outpatient Follow-up:  Leaving against medical Advice   Discharge Condition: Leaving against Medical Advice CODE STATUS:FULL    Brief/Interim Summary: 71 year old female with a history of systolic CHF, hyperlipidemia, hypertension, tobacco abuse, hypothyroidism, and small bowel obstruction presented with one-day history of abdominal pain that began on the morning of 11/13/2016. She stated that her pain was periumbilical and suprapubic without any vomiting. Her last bowel movement was on the morning of 11/13/2016. She denies any fevers, chills, chest pain, shortness of breath, hematochezia, melena, dysuria, hematuria. In the emergency department, CT of the abdomen and pelvis revealed dilated small bowel loops in the left lower quadrant with fecalization secondary to transition zone in the right side of the pelvis. There was some possible enhancement of the small bowel walls post-transition zone. Due to concerns of small bowel obstruction, the patient was admitted for further evaluation.  General Surgery was consulted to assist in management. She was placed on hydralazine IV prn SBP >180 as the patient was taken off her po anti-HTN due to her bowel obstruction.  The patient had an admission from 06/28/2015 through 06/30/2015 for small bowel obstruction which resolved without any surgical intervention.  The patient showed clinical improvement after 24-48 hours. On 11/15/16, general surgery, Dr. Arnoldo Morale, advanced the patient's diet to full liquids.  Unfortunately, the patient developed increased abdominal distension and abdominal pain on 11/16/16.  Ducolax suppositories as well as a milk and molasses enema were ordered by general surgery.  Despite  having a BM, the patient's abdominal pain and distension did not improve.  I evaluated the patient on afternoon of 11/16/16 and recommended the patient be placed npo again and insertion of NG tube for decompression.  I also offered to contact Dr. Arnoldo Morale for any further recommendations. The patient's family/daughters were not happy with the patient's pain control or hypertensive management.  They wanted to leave AMA and seek medical care elsewhere.  I offered a possible transfer to [facility] if [facility] would be willing to see her in consult.  After discussion, the patient and daughter wishes to leave AMA and seek medical care themselves.  I discussed the risks including but not limited to worsening pain, bowel perforation, sepsis, death.  They expressed understanding.  Discharge Diagnoses:  Small bowel Obstruction -11/13/2016--revealed dilated small bowel loops in the left lower quadrant with equalization with transition zone in the right side of the pelvis. There was some possible enhancement of the small bowel walls post-transition zone. -General surgery consult appreciated -11/14/16--vomit x 1 -11/15/16--no vomiting -NPO-->full liquid diet per surgery on 5/25 -11/16/16--increase abd pain, no vomiting, increase distension -11/16/16 abd xray--continued bowel obstruction -recommended NPO and NG decompression-->family and pt wish to leave AMA -risks, benefits, alternatives of leaving AMA discussed with pt and family; they expressed understanding -IVF--add KCl to IVF -am BMP -increase activity  Essential hypertension -Well-documented noncompliance with medications at home -Hydralazine when necessary SBP >180 -BP elevated in part due to pain -restart amlodipine when able to tolerate po reliably  Chronic systolic CHF -50/93/2671 echo EF 55-60%, no WMA, trivial MR -Daily weights -Clinically compensated  Impaired glucose tolerance -11/14/16 hemoglobin A1c--5.9 -06/28/2015 hemoglobin  A1c 6.1  Tobacco abuse -Tobacco cessation discussed  Hypothyroidism -10/28/2016 TSH 3.07 -Switch levothyroxine to IV  Sinus bradycardia -  This has been a chronic problem for the patient -will not restart coreg -Remained stable -Monitor clinically   Discharge Instructions   Allergies as of 11/16/2016      Reactions   Lisinopril Swelling   Angioedema   Atorvastatin Other (See Comments)   Muscle aches       Medication List    STOP taking these medications   carvedilol 3.125 MG tablet Commonly known as:  COREG   ibuprofen 200 MG tablet Commonly known as:  ADVIL,MOTRIN     TAKE these medications   amLODipine 10 MG tablet Commonly known as:  NORVASC TAKE ONE TABLET BY MOUTH ONCE DAILY   aspirin 81 MG tablet Take 81 mg by mouth daily.   FISH OIL PO Take 1 tablet by mouth daily.   levothyroxine 75 MCG tablet Commonly known as:  SYNTHROID, LEVOTHROID TAKE ONE TABLET BY MOUTH ONCE DAILY   multivitamin with minerals tablet Take 1 tablet by mouth daily.       Allergies  Allergen Reactions  . Lisinopril Swelling    Angioedema  . Atorvastatin Other (See Comments)    Muscle aches      Consultations:  General surgery--[facility]   Procedures/Studies: Ct Abdomen Pelvis W Contrast  Result Date: 11/13/2016 CLINICAL DATA:  Acute generalized abdominal pain. EXAM: CT ABDOMEN AND PELVIS WITH CONTRAST TECHNIQUE: Multidetector CT imaging of the abdomen and pelvis was performed using the standard protocol following bolus administration of intravenous contrast. CONTRAST:  137mL ISOVUE-300 IOPAMIDOL (ISOVUE-300) INJECTION 61% COMPARISON:  CT scan of June 28, 2015. FINDINGS: Lower chest: No acute abnormality. Hepatobiliary: No focal liver abnormality is seen. Status post cholecystectomy. No biliary dilatation. Pancreas: Unremarkable. No pancreatic ductal dilatation or surrounding inflammatory changes. Spleen: Normal in size without focal abnormality. Adrenals/Urinary  Tract: Adrenal glands are unremarkable. No hydronephrosis or renal obstruction is noted. 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 Urinary bladder is unremarkable. Stomach/Bowel: The appendix is not clearly identified, but no inflammation is seen in the right lower quadrant. No colonic dilatation is noted. Dilated small bowel loops are noted in the left lower quadrant and pelvis with fecalization noted. Transition zone is noted in the right side of the pelvis best seen on image number 69 of series 2. The small bowel beyond the transition zone is nondilated, but demonstrates possible enhancing walls suggesting inflammation. Vascular/Lymphatic: Aortic atherosclerosis. No enlarged abdominal or pelvic lymph nodes. Reproductive: Status post hysterectomy. No adnexal masses. Other: No abdominal wall hernia or abnormality. No abdominopelvic ascites. Musculoskeletal: No acute or significant osseous findings. IMPRESSION: Dilated small bowel loops are noted in the left lower quadrant of the abdomen and pelvis with fecalization, secondary to transition zone seen in the right pelvis. The ileum beyond the transition zone is nondilated but did demonstrate possible wall enhancement, suggesting active inflammation. It is uncertain if this partial obstruction may be due to a adhesion or inflammatory bowel disease such as Crohn's disease. Clinical correlation is recommended. Aortic atherosclerosis. Electronically Signed   By: Marijo Conception, M.D.   On: 11/13/2016 15:08   Dg Abd 2 Views  Result Date: 11/16/2016 CLINICAL DATA:  Small bowel obstruction. EXAM: ABDOMEN - 2 VIEW COMPARISON:  CT scan Nov 13, 2016 FINDINGS: The lung bases are normal. No free air or portal venous gas. Air-filled dilated loops of small bowel are worsened compared to the scout film from Nov 13, 2016. There is a paucity of colonic  gas. Degenerative changes in the hips. IMPRESSION:  Worsening small bowel obstruction. Electronically Signed   By: Dorise Bullion III M.D   On: 11/16/2016 09:18        Discharge Exam: Vitals:   11/16/16 0500 11/16/16 1456  BP: (!) 191/86 (!) 179/89  Pulse: (!) 51 (!) 52  Resp: 18 18  Temp: 98.7 F (37.1 C) 98.7 F (37.1 C)   Vitals:   11/15/16 1300 11/15/16 2100 11/16/16 0500 11/16/16 1456  BP: (!) 155/74 (!) 154/72 (!) 191/86 (!) 179/89  Pulse: (!) 53 (!) 57 (!) 51 (!) 52  Resp: 18 18 18 18   Temp: 98.8 F (37.1 C) 98.5 F (36.9 C) 98.7 F (37.1 C) 98.7 F (37.1 C)  TempSrc: Oral Oral Oral Oral  SpO2: 100% 99% 100% 100%  Weight:      Height:        General: Pt is alert, awake, not in acute distress Cardiovascular: RRR, S1/S2 +, no rubs, no gallops Respiratory: diminished breath sounds but CTA bilaterally, no wheezing, no rhonchi Abdominal: Soft, diffusely tender, distended, bowel sounds + Extremities: no edema, no cyanosis   The results of significant diagnostics from this hospitalization (including imaging, microbiology, ancillary and laboratory) are listed below for reference.    Significant Diagnostic Studies: Ct Abdomen Pelvis W Contrast  Result Date: 11/13/2016 CLINICAL DATA:  Acute generalized abdominal pain. EXAM: CT ABDOMEN AND PELVIS WITH CONTRAST TECHNIQUE: Multidetector CT imaging of the abdomen and pelvis was performed using the standard protocol following bolus administration of intravenous contrast. CONTRAST:  144mL ISOVUE-300 IOPAMIDOL (ISOVUE-300) INJECTION 61% COMPARISON:  CT scan of June 28, 2015. FINDINGS: Lower chest: No acute abnormality. Hepatobiliary: No focal liver abnormality is seen. Status post cholecystectomy. No biliary dilatation. Pancreas: Unremarkable. No pancreatic ductal dilatation or surrounding inflammatory changes. Spleen: Normal in size without focal abnormality. Adrenals/Urinary Tract: Adrenal glands are unremarkable. No hydronephrosis or renal obstruction is noted. Stable mild  rotation of left kidney is noted which is congenital. 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 Urinary bladder is unremarkable. Stomach/Bowel: The appendix is not clearly identified, but no inflammation is seen in the right lower quadrant. No colonic dilatation is noted. Dilated small bowel loops are noted in the left lower quadrant and pelvis with fecalization noted. Transition zone is noted in the right side of the pelvis best seen on image number 69 of series 2. The small bowel beyond the transition zone is nondilated, but demonstrates possible enhancing walls suggesting inflammation. Vascular/Lymphatic: Aortic atherosclerosis. No enlarged abdominal or pelvic lymph nodes. Reproductive: Status post hysterectomy. No adnexal masses. Other: No abdominal wall hernia or abnormality. No abdominopelvic ascites. Musculoskeletal: No acute or significant osseous findings. IMPRESSION: Dilated small bowel loops are noted in the left lower quadrant of the abdomen and pelvis with fecalization, secondary to transition zone seen in the right pelvis. The ileum beyond the transition zone is nondilated but did demonstrate possible wall enhancement, suggesting active inflammation. It is uncertain if this partial obstruction may be due to a adhesion or inflammatory bowel disease such as Crohn's disease. Clinical correlation is recommended. Aortic atherosclerosis. Electronically Signed   By: Marijo Conception, M.D.   On: 11/13/2016 15:08   Dg Abd 2 Views  Result Date: 11/16/2016 CLINICAL DATA:  Small bowel obstruction. EXAM: ABDOMEN - 2 VIEW COMPARISON:  CT scan Nov 13, 2016 FINDINGS: The lung bases are normal. No free air or portal venous gas. Air-filled dilated loops of small bowel are worsened compared  to the scout film from Nov 13, 2016. There is a paucity of colonic gas. Degenerative changes in the hips. IMPRESSION: Worsening small bowel obstruction. Electronically Signed   By: Dorise Bullion III M.D   On: 11/16/2016  09:18     Microbiology: No results found for this or any previous visit (from the past 240 hour(s)).   Labs: Basic Metabolic Panel:  Recent Labs Lab 11/13/16 1204 11/14/16 0602 11/15/16 0608 11/16/16 0603  NA 140 139 137 137  K 4.3 3.5 3.6 3.6  CL 105 106 102 100*  CO2 28 26 25 26   GLUCOSE 129* 121* 114* 108*  BUN 11 8 8 8   CREATININE 0.84 0.69 0.72 0.69  CALCIUM 9.5 8.6* 8.8* 8.9  MG  --  1.8 1.8 1.8   Liver Function Tests:  Recent Labs Lab 11/13/16 1204  AST 23  ALT 19  ALKPHOS 71  BILITOT 0.7  PROT 7.7  ALBUMIN 3.9    Recent Labs Lab 11/13/16 1204  LIPASE 23   No results for input(s): AMMONIA in the last 168 hours. CBC:  Recent Labs Lab 11/13/16 1225 11/14/16 0602 11/15/16 0608 11/16/16 0603  WBC 11.3* 12.1* 10.7* 9.7  NEUTROABS 8.7*  --   --   --   HGB 13.1 12.6 13.0 13.0  HCT 39.1 38.2 38.7 39.2  MCV 91.6 92.0 91.7 91.8  PLT 307 307 314 325   Cardiac Enzymes: No results for input(s): CKTOTAL, CKMB, CKMBINDEX, TROPONINI in the last 168 hours. BNP: Invalid input(s): POCBNP CBG: No results for input(s): GLUCAP in the last 168 hours.  Time coordinating discharge:  Greater than 30 minutes  Signed:  Roniesha Hollingshead, DO [facility] Pager: [phone] 11/16/2016, 4:40 PM

## 2016-11-16 NOTE — H&P (Signed)
History and Physical    Adrienne [name] [id number]:[id number] DOB: 1945/12/27 DOA: 11/16/2016  PCP: Orlena Sheldon, PA-C   Patient coming from: Home.  I have personally briefly reviewed patient's old medical records in [place]  Chief Complaint: Abdominal pain and small bowel obstruction.  HPI: Adrienne [name] is a 71 y.o. female with medical history significant of systolic CHF, hyperlipidemia, hypertension, shoulder pain, tobacco use disorder, hypothyroidism, recurrent small bowel obstruction who was admitted on 11/13/2016 to [facility] for small bowel obstruction, but signed AMA earlier today since there was no improvement on her bowel obstruction obstruction and they wanted to come to [facility] for a second opinion.   Per patient and daughter, she started having abdominal pain on Wednesday, 11/13/2016 a.m., which the patient describes as cramping associated with nausea. She denied vomiting at that time and had a small bowel movement of solid stool, which did not relieve the symptoms significantly. Later that morning she went to the emergency department for imaging showed dilated small bowel loops in the left lower quadrant of the abdomen and pelvis with the colonization with secondary transitions on seeing in the right pelvis. Daily him being on the transition zone was nondilated, but was suspicious for active inflammation which could be related to adhesion or inflammatory bowel disease.   She denies headache, sore throat, chest pain, dizziness, palpitations, diaphoresis, PND, orthopnea or pitting edema of the lower extremities. She denies dyspnea, wheezing, but has occasional productive cough. She denies dysuria, frequency or hematuria. The patient denies passing flat is a recent bowel movement. She has been nauseous and has had several episodes of emesis today  ED Course: She had 2 episodes of emesis while I was examining her. The patient received IV fluids and NG tube placement was tried  5 different times. In labs done at [facility] showed WBC of 9.7, hemoglobin 13 g/dL and platelets 325. Sodium 130, potassium 3.6, chloride 100 and bicarbonate 26 mmol/L. BUN 8, creatinine 0.69, calcium 8.9, glucose 108 and magnesium 1.8 mg/dL. Her abdominal x-ray showed worsening of SBO.  Review of Systems: As per HPI otherwise 10 point review of systems negative.    Past Medical History:  Diagnosis Date  . CHF (congestive heart failure) (HCC)    EF 45-50%  . Hyperlipidemia   . Hypertension   . Knee pain   . SBO (small bowel obstruction) ([place])   . Shoulder pain   . Smoker   . Thyroid disease     Past Surgical History:  Procedure Laterality Date  . ABDOMINAL HYSTERECTOMY  1986  . BACK SURGERY    . CHOLECYSTECTOMY    . polypectomy-oropharynx    . right knee meniscus       reports that she has been smoking E-cigarettes.  She has a 53.00 pack-year smoking history. She has never used smokeless tobacco. She reports that she does not drink alcohol or use drugs.  Allergies  Allergen Reactions  . Lisinopril Swelling    Angioedema  . Atorvastatin Other (See Comments)    Muscle aches      Family History  Problem Relation Age of Onset  . Cancer Sister   . Diabetes Brother   . Diabetes Brother   . Hypertension Other     Prior to Admission medications   Medication Sig Start Date End Date Taking? Authorizing Provider  amLODipine (NORVASC) 10 MG tablet TAKE ONE TABLET BY MOUTH ONCE DAILY 10/28/16  Yes Orlena Sheldon, PA-C  aspirin EC  81 MG tablet Take 81 mg by mouth daily.   Yes [provider]  carvedilol (COREG) 3.125 MG tablet Take 3.125 mg by mouth daily.   Yes [provider]  levothyroxine (SYNTHROID, LEVOTHROID) 75 MCG tablet TAKE ONE TABLET BY MOUTH ONCE DAILY 10/02/16  Yes Orlena Sheldon, PA-C  Multiple Vitamins-Minerals (MULTIVITAMIN WITH MINERALS) tablet Take 1 tablet by mouth daily.   Yes [provider]  naproxen sodium (ALEVE) 220 MG tablet Take 220  mg by mouth 2 (two) times daily as needed (pain).   Yes [provider]  Omega-3 Fatty Acids (FISH OIL PO) Take 1 capsule by mouth daily.    Yes [provider]  OVER THE COUNTER MEDICATION Place 1 drop into both eyes daily as needed (dry eyes). Over the counter lubricating eye drop   Yes [provider]    Physical Exam: Vitals:   11/16/16 1740 11/16/16 1942 11/16/16 1944  BP: (!) 183/103 (!) 169/95   Pulse: 63  (!) 53  Resp: 20    Temp: 99.6 F (37.6 C)    TempSrc: Oral    SpO2: 95%  97%    Constitutional: NAD, calm, comfortable Eyes: PERRL, lids and conjunctivae normal ENMT: Mucous membranes are moist. Posterior pharynx clear of any exudate or lesions. Neck: normal, supple, no masses, no thyromegaly Respiratory: clear to auscultation bilaterally, no wheezing, no crackles. Normal respiratory effort. No accessory muscle use.  Cardiovascular: Bradycardic at 52 BPM, no murmurs / rubs / gallops. No extremity edema. 2+ pedal pulses. No carotid bruits.  Abdomen: Distended, mild diffuse tenderness, no guarding/rebound masses palpated. No hepatosplenomegaly. Bowel sounds are hypoactive Musculoskeletal: no clubbing / cyanosis. Good ROM, no contractures. Normal muscle tone.  Skin: no rashes, lesions, ulcers on limited skin exam Neurologic: CN 2-12 grossly intact. Sensation intact, DTR normal. Strength 5/5 in all 4.  Psychiatric: Normal judgment and insight. Alert and oriented x 3. Normal mood.    Labs on Admission: I have personally reviewed following labs and imaging studies  CBC:  Recent Labs Lab 11/13/16 1225 11/14/16 0602 11/15/16 0608 11/16/16 0603  WBC 11.3* 12.1* 10.7* 9.7  NEUTROABS 8.7*  --   --   --   HGB 13.1 12.6 13.0 13.0  HCT 39.1 38.2 38.7 39.2  MCV 91.6 92.0 91.7 91.8  PLT 307 307 314 169   Basic Metabolic Panel:  Recent Labs Lab 11/13/16 1204 11/14/16 0602 11/15/16 0608 11/16/16 0603  NA 140 139 137 137  K 4.3 3.5 3.6 3.6    CL 105 106 102 100*  CO2 28 26 25 26   GLUCOSE 129* 121* 114* 108*  BUN 11 8 8 8   CREATININE 0.84 0.69 0.72 0.69  CALCIUM 9.5 8.6* 8.8* 8.9  MG  --  1.8 1.8 1.8   GFR: Estimated Creatinine Clearance: 65.2 mL/min (by C-G formula based on SCr of 0.69 mg/dL). Liver Function Tests:  Recent Labs Lab 11/13/16 1204  AST 23  ALT 19  ALKPHOS 71  BILITOT 0.7  PROT 7.7  ALBUMIN 3.9    Recent Labs Lab 11/13/16 1204  LIPASE 23   No results for input(s): AMMONIA in the last 168 hours. Coagulation Profile: No results for input(s): INR, PROTIME in the last 168 hours. Cardiac Enzymes: No results for input(s): CKTOTAL, CKMB, CKMBINDEX, TROPONINI in the last 168 hours. BNP (last 3 results) No results for input(s): PROBNP in the last 8760 hours. HbA1C:  Recent Labs  11/14/16 0602  HGBA1C 5.9*   CBG:  No results for input(s): GLUCAP in the last 168 hours. Lipid Profile: No results for input(s): CHOL, HDL, LDLCALC, TRIG, CHOLHDL, LDLDIRECT in the last 72 hours. Thyroid Function Tests: No results for input(s): TSH, T4TOTAL, FREET4, T3FREE, THYROIDAB in the last 72 hours. Anemia Panel: No results for input(s): VITAMINB12, FOLATE, FERRITIN, TIBC, IRON, RETICCTPCT in the last 72 hours. Urine analysis:    Component Value Date/Time   COLORURINE YELLOW 11/13/2016 [place] 11/13/2016 1156   LABSPEC 1.014 11/13/2016 1156   PHURINE 6.0 11/13/2016 1156   GLUCOSEU NEGATIVE 11/13/2016 [place] 11/13/2016 [place] 11/13/2016 [place] 11/13/2016 [place] 11/13/2016 1156   NITRITE NEGATIVE 11/13/2016 [place] 11/13/2016 1156    Radiological Exams on Admission: Dg Abd 2 Views  Result Date: 11/16/2016 CLINICAL DATA:  Small bowel obstruction. EXAM: ABDOMEN - 2 VIEW COMPARISON:  CT scan Nov 13, 2016 FINDINGS: The lung bases are normal. No free air or portal venous gas. Air-filled dilated  loops of small bowel are worsened compared to the scout film from Nov 13, 2016. There is a paucity of colonic gas. Degenerative changes in the hips. IMPRESSION: Worsening small bowel obstruction. Electronically Signed   By: Dorise Bullion III M.D   On: 11/16/2016 09:18    EKG: Independently reviewed. Ordered. Pending.  Assessment/Plan Principal Problem:   SBO (small bowel obstruction) (HCC) Admit to telemetry/inpatient. Keep nothing by mouth. NG tube will have to be placed by radiology in the morning. Resume low intermittent suction and CT scan with Gastrografin once NG tube in place. Analgesics and antiemetics as needed. Consider discontinuing amlodipine and substitue for a different antihypertensive, to decrease risk of recurring SBO due to constipation.  Active Problems:   Bradycardia  Beta blocker has been held. Continue cardiac monitoring.    Hypertension Hydralazine 10 mg IVP every 4 hours as needed. Monitor blood pressure. Most likely beta blocker will need to be discontinued. May benefit from switching amlodipine for another agent. An ACE inhibitor would be a good choice given her history of glucose intolerance.    Dyslipidemia Currently not on medical treatment.    Chronic systolic CHF (congestive heart failure) (HCC) No signs of decompensation at this time. Beta blocker held due to bradycardia. Monitor intake and output. Monitor for signs of decompensation.    Hypothyroidism Levothyroxine 40 g IVP daily while nothing by mouth.    Tobacco use disorder Declined nicotine replacement therapy.    Impaired glucose tolerance Currently nothing by mouth. CBG monitoring every 6 hours. Switch to before meals and at bedtime once diet is resumed.    DVT prophylaxis: Heparin SQ. Code Status: Full code. Family Communication: Her daughters Caryl Pina and Joelene Millin were present in the emergency department. Disposition Plan: Admit for NG tube suctioning. Consults called:  General surgery (Dr. Hulen Skains) Admission status: Inpatient/telemetry.   Reubin Milan MD [facility] Pager [phone].  If 7PM-7AM, please contact night-coverage [email or web address] Password [facility]  11/16/2016, 8:31 PM

## 2016-11-16 NOTE — Progress Notes (Signed)
  Subjective: No bowel movement or flatus. No emesis noted.  Objective: Vital signs in last 24 hours: Temp:  [98.5 F (36.9 C)-98.8 F (37.1 C)] 98.7 F (37.1 C) (05/26 0500) Pulse Rate:  [51-57] 51 (05/26 0500) Resp:  [18] 18 (05/26 0500) BP: (154-191)/(72-86) 191/86 (05/26 0500) SpO2:  [99 %-100 %] 100 % (05/26 0500) Last BM Date: 11/15/16  Intake/Output from previous day: 05/25 0701 - 05/26 0700 In: 1460 [P.O.:960; I.V.:500] Out: 900 [Urine:900] Intake/Output this shift: No intake/output data recorded.  General appearance: alert, cooperative and no distress GI: Soft, slightly distended but not rigid. No specific tenderness noted.  Lab Results:   Recent Labs  11/14/16 0602 11/15/16 0608  WBC 12.1* 10.7*  HGB 12.6 13.0  HCT 38.2 38.7  PLT 307 314   BMET  Recent Labs  11/14/16 0602 11/15/16 0608  NA 139 137  K 3.5 3.6  CL 106 102  CO2 26 25  GLUCOSE 121* 114*  BUN 8 8  CREATININE 0.69 0.72  CALCIUM 8.6* 8.8*   PT/INR No results for input(s): LABPROT, INR in the last 72 hours.  Studies/Results: No results found.  Anti-infectives: Anti-infectives    None      Assessment/Plan: Impression: Partial small bowel obstruction. Plan: Will give molasses enema today. No need for acute surgical intervention.  LOS: 2 days    [facility] Signs 11/16/2016

## 2016-11-16 NOTE — ED Notes (Signed)
Multiple attempts by this RN to placed NG tube, unsuccessful. Primary RN notified.

## 2016-11-16 NOTE — Progress Notes (Addendum)
Patient given Molasses and Milk Enema, Two small bowel movements since administration of enema.

## 2016-11-16 NOTE — Consult Note (Signed)
Reason for Consult:Smal bowel obstruction Referring Physician: Dr. Shaaron Adler Adrienne [name] is an 71 y.o. female.  HPI: Patient has been hospitalized at [facility] with SBO, on the hospitalist service.  Not happy with progress at that hosptal  Past Medical History:  Diagnosis Date  . CHF (congestive heart failure) (HCC)    EF 45-50%  . Hyperlipidemia   . Hypertension   . Knee pain   . SBO (small bowel obstruction) ([place])   . Shoulder pain   . Smoker   . Thyroid disease     Past Surgical History:  Procedure Laterality Date  . ABDOMINAL HYSTERECTOMY  1986  . BACK SURGERY    . CHOLECYSTECTOMY    . polypectomy-oropharynx    . right knee meniscus      Family History  Problem Relation Age of Onset  . Cancer Sister   . Diabetes Brother   . Diabetes Brother   . Hypertension Other     Social History:  reports that she has been smoking E-cigarettes.  She has a 53.00 pack-year smoking history. She has never used smokeless tobacco. She reports that she does not drink alcohol or use drugs.  Allergies:  Allergies  Allergen Reactions  . Lisinopril Swelling    Angioedema  . Atorvastatin Other (See Comments)    Muscle aches      Medications: I have reviewed the patient's current medications.  Results for orders placed or performed during the hospital encounter of 11/13/16 (from the past 48 hour(s))  Basic metabolic panel     Status: Abnormal   Collection Time: 11/15/16  6:08 AM  Result Value Ref Range   Sodium 137 135 - 145 mmol/L   Potassium 3.6 3.5 - 5.1 mmol/L   Chloride 102 101 - 111 mmol/L   CO2 25 22 - 32 mmol/L   Glucose, Bld 114 (H) 65 - 99 mg/dL   BUN 8 6 - 20 mg/dL   Creatinine, Ser 0.72 0.44 - 1.00 mg/dL   Calcium 8.8 (L) 8.9 - 10.3 mg/dL   GFR calc non Af Amer >60 >60 mL/min   GFR calc Af Amer >60 >60 mL/min    Comment: (NOTE) The eGFR has been calculated using the CKD EPI equation. This calculation has not been validated in all clinical  situations. eGFR's persistently <60 mL/min signify possible Chronic Kidney Disease.    Anion gap 10 5 - 15  CBC     Status: Abnormal   Collection Time: 11/15/16  6:08 AM  Result Value Ref Range   WBC 10.7 (H) 4.0 - 10.5 K/uL   RBC 4.22 3.87 - 5.11 MIL/uL   Hemoglobin 13.0 12.0 - 15.0 g/dL   HCT 38.7 36.0 - 46.0 %   MCV 91.7 78.0 - 100.0 fL   MCH 30.8 26.0 - 34.0 pg   MCHC 33.6 30.0 - 36.0 g/dL   RDW 14.2 11.5 - 15.5 %   Platelets 314 150 - 400 K/uL  Magnesium     Status: None   Collection Time: 11/15/16  6:08 AM  Result Value Ref Range   Magnesium 1.8 1.7 - 2.4 mg/dL  Basic metabolic panel     Status: Abnormal   Collection Time: 11/16/16  6:03 AM  Result Value Ref Range   Sodium 137 135 - 145 mmol/L   Potassium 3.6 3.5 - 5.1 mmol/L   Chloride 100 (L) 101 - 111 mmol/L   CO2 26 22 - 32 mmol/L   Glucose, Bld 108 (H)  65 - 99 mg/dL   BUN 8 6 - 20 mg/dL   Creatinine, Ser 0.69 0.44 - 1.00 mg/dL   Calcium 8.9 8.9 - 10.3 mg/dL   GFR calc non Af Amer >60 >60 mL/min   GFR calc Af Amer >60 >60 mL/min    Comment: (NOTE) The eGFR has been calculated using the CKD EPI equation. This calculation has not been validated in all clinical situations. eGFR's persistently <60 mL/min signify possible Chronic Kidney Disease.    Anion gap 11 5 - 15  Magnesium     Status: None   Collection Time: 11/16/16  6:03 AM  Result Value Ref Range   Magnesium 1.8 1.7 - 2.4 mg/dL  CBC     Status: None   Collection Time: 11/16/16  6:03 AM  Result Value Ref Range   WBC 9.7 4.0 - 10.5 K/uL   RBC 4.27 3.87 - 5.11 MIL/uL   Hemoglobin 13.0 12.0 - 15.0 g/dL   HCT 39.2 36.0 - 46.0 %   MCV 91.8 78.0 - 100.0 fL   MCH 30.4 26.0 - 34.0 pg   MCHC 33.2 30.0 - 36.0 g/dL   RDW 14.0 11.5 - 15.5 %   Platelets 325 150 - 400 K/uL    Dg Abd 2 Views  Result Date: 11/16/2016 CLINICAL DATA:  Small bowel obstruction. EXAM: ABDOMEN - 2 VIEW COMPARISON:  CT scan Nov 13, 2016 FINDINGS: The lung bases are normal. No free  air or portal venous gas. Air-filled dilated loops of small bowel are worsened compared to the scout film from Nov 13, 2016. There is a paucity of colonic gas. Degenerative changes in the hips. IMPRESSION: Worsening small bowel obstruction. Electronically Signed   By: Dorise Bullion III M.D   On: 11/16/2016 09:18    Review of Systems  Gastrointestinal: Positive for abdominal pain, nausea and vomiting.  All other systems reviewed and are negative.  Blood pressure (!) 158/84, pulse (!) 56, temperature 99.6 F (37.6 C), temperature source Oral, resp. rate 14, SpO2 95 %. Physical Exam  Constitutional: She is oriented to person, place, and time. She appears well-developed and well-nourished.  HENT:  Head: Normocephalic and atraumatic.  Eyes: Conjunctivae and EOM are normal. Pupils are equal, round, and reactive to light.  Neck: Normal range of motion. Neck supple.  Cardiovascular: Normal rate, regular rhythm and normal heart sounds.   Respiratory: Effort normal and breath sounds normal.  GI: She exhibits distension. Bowel sounds are decreased. There is generalized tenderness. There is no rigidity, no rebound and no guarding.  Musculoskeletal: Normal range of motion.  Neurological: She is alert and oriented to person, place, and time. She has normal reflexes.  Skin: Skin is warm.  Psychiatric: She has a normal mood and affect. Her behavior is normal. Judgment and thought content normal.    Assessment/Plan: SBO NGT, IV hydration, SB protocol  Hanifa Antonetti 11/16/2016, 9:43 PM

## 2016-11-16 NOTE — ED Provider Notes (Signed)
[facility] DEPT Provider Note   CSN: [id number] Arrival date & time: 11/16/16  1712     History   Chief Complaint Chief Complaint  Patient presents with  . Abdominal Pain  . Emesis    HPI Adrienne [name] is a 71 y.o. female.  Patient with remote hx tubal preg, hysterectomy, cholecystectomy, and subsequent bowel obstructions presents w c/o diffuse abd pain, distension and nv for the past 3 days.  Symptoms moderate, persistent, waxing and waning in intensity, worse today.  [facility], but left there AMA today as was unhappy w care.   nv persists today, dark/brownish. Not passing gas. States no bm in a few days although notes minimal output s/p suppository/enema. Denies fever or chills.    The history is provided by the patient and a relative.  Abdominal Pain   Associated symptoms include diarrhea and vomiting. Pertinent negatives include fever, dysuria and headaches.  Emesis   Associated symptoms include abdominal pain and diarrhea. Pertinent negatives include no fever and no headaches.    Past Medical History:  Diagnosis Date  . CHF (congestive heart failure) (HCC)    EF 45-50%  . Hyperlipidemia   . Hypertension   . Knee pain   . SBO (small bowel obstruction) ([place])   . Shoulder pain   . Smoker   . Thyroid disease     Patient Active Problem List   Diagnosis Date Noted  . Impaired glucose tolerance 11/14/2016  . Bowel obstruction ([place]) 11/13/2016  . Hyperglycemia 11/13/2016  . Cataract of both eyes 01/25/2016  . Glaucoma of both eyes 01/25/2016  . Well woman exam with routine gynecological exam 01/17/2016  . Bradycardia 06/29/2015  . Hypokalemia 06/29/2015  . SBO (small bowel obstruction) ([place]) 06/28/2015  . Arthritis of knee, right 01/04/2015  . Bursitis of left shoulder 01/04/2015  . Routine gynecological examination 12/30/2013  . Noncompliance with medications 12/02/2013  . Smoker   . Hypertension 10/04/2010  . Dyslipidemia  10/04/2010  . Chest pain 10/04/2010  . Chronic systolic CHF (congestive heart failure) ([place]) 10/04/2010  . Hypothyroidism 10/04/2010    Past Surgical History:  Procedure Laterality Date  . ABDOMINAL HYSTERECTOMY  1986  . BACK SURGERY    . CHOLECYSTECTOMY    . polypectomy-oropharynx    . right knee meniscus      OB History    Gravida Para Term Preterm AB Living   4 3     1 3    SAB TAB Ectopic Multiple Live Births       1   3       Home Medications    Prior to Admission medications   Medication Sig Start Date End Date Taking? Authorizing Provider  amLODipine (NORVASC) 10 MG tablet TAKE ONE TABLET BY MOUTH ONCE DAILY 10/28/16   Orlena Sheldon, PA-C  aspirin 81 MG tablet Take 81 mg by mouth daily.    [provider]  levothyroxine (SYNTHROID, LEVOTHROID) 75 MCG tablet TAKE ONE TABLET BY MOUTH ONCE DAILY 10/02/16   Orlena Sheldon, PA-C  Multiple Vitamins-Minerals (MULTIVITAMIN WITH MINERALS) tablet Take 1 tablet by mouth daily.    [provider]  Omega-3 Fatty Acids (FISH OIL PO) Take 1 tablet by mouth daily.    [provider]    Family History Family History  Problem Relation Age of Onset  . Cancer Sister   . Diabetes Brother   . Diabetes Brother   . Hypertension Other  Social History Social History  Substance Use Topics  . Smoking status: Current Every Day Smoker    Packs/day: 1.00    Years: 53.00    Types: E-cigarettes  . Smokeless tobacco: Never Used     Comment: Smoker since 1975 has quit and started back several times!!, now smoking 7 cigs/week  . Alcohol use No     Allergies   Lisinopril and Atorvastatin   Review of Systems Review of Systems  Constitutional: Negative for fever.  HENT: Negative for sore throat.   Eyes: Negative for redness.  Respiratory: Negative for shortness of breath.   Cardiovascular: Negative for chest pain.  Gastrointestinal: Positive for abdominal distention, abdominal pain, diarrhea and vomiting.    Genitourinary: Negative for dysuria and flank pain.  Musculoskeletal: Negative for back pain.  Skin: Negative for rash.  Neurological: Negative for headaches.  Hematological: Does not bruise/bleed easily.  Psychiatric/Behavioral: Negative for confusion.     Physical Exam Updated Vital Signs BP (!) 183/103 (BP Location: Right Arm)   Pulse 63   Temp 99.6 F (37.6 C) (Oral)   Resp 20   SpO2 95%   Physical Exam  Constitutional: She appears well-developed and well-nourished. No distress.  HENT:  Mouth/Throat: Oropharynx is clear and moist.  Eyes: Conjunctivae are normal. No scleral icterus.  Neck: Neck supple. No tracheal deviation present.  Cardiovascular: Normal rate, regular rhythm, normal heart sounds and intact distal pulses.   Pulmonary/Chest: Effort normal and breath sounds normal. No respiratory distress.  Abdominal: Soft. Normal appearance. She exhibits distension. She exhibits no mass. There is tenderness. There is no rebound and no guarding. No hernia.  Mild diffuse tenderness. No incarcerated hernia felt.   Genitourinary:  Genitourinary Comments: No cva tenderness  Musculoskeletal: She exhibits no edema.  Neurological: She is alert.  Skin: Skin is warm and dry. No rash noted. She is not diaphoretic.  Psychiatric: She has a normal mood and affect.  Nursing note and vitals reviewed.    ED Treatments / Results  Labs (all labs ordered are listed, but only abnormal results are displayed) Results for orders placed or performed during the hospital encounter of 11/13/16  Lipase, blood  Result Value Ref Range   Lipase 23 11 - 51 U/L  Comprehensive metabolic panel  Result Value Ref Range   Sodium 140 135 - 145 mmol/L   Potassium 4.3 3.5 - 5.1 mmol/L   Chloride 105 101 - 111 mmol/L   CO2 28 22 - 32 mmol/L   Glucose, Bld 129 (H) 65 - 99 mg/dL   BUN 11 6 - 20 mg/dL   Creatinine, Ser 0.84 0.44 - 1.00 mg/dL   Calcium 9.5 8.9 - 10.3 mg/dL   Total Protein 7.7 6.5 - 8.1  g/dL   Albumin 3.9 3.5 - 5.0 g/dL   AST 23 15 - 41 U/L   ALT 19 14 - 54 U/L   Alkaline Phosphatase 71 38 - 126 U/L   Total Bilirubin 0.7 0.3 - 1.2 mg/dL   GFR calc non Af Amer >60 >60 mL/min   GFR calc Af Amer >60 >60 mL/min   Anion gap 7 5 - 15  Urinalysis, Routine w reflex microscopic  Result Value Ref Range   Color, Urine YELLOW YELLOW   APPearance CLEAR CLEAR   Specific Gravity, Urine 1.014 1.005 - 1.030   pH 6.0 5.0 - 8.0   Glucose, UA NEGATIVE NEGATIVE mg/dL   Hgb urine dipstick NEGATIVE NEGATIVE   Bilirubin Urine NEGATIVE NEGATIVE  Ketones, ur NEGATIVE NEGATIVE mg/dL   Protein, ur NEGATIVE NEGATIVE mg/dL   Nitrite NEGATIVE NEGATIVE   Leukocytes, UA NEGATIVE NEGATIVE  CBC with Differential  Result Value Ref Range   WBC 11.3 (H) 4.0 - 10.5 K/uL   RBC 4.27 3.87 - 5.11 MIL/uL   Hemoglobin 13.1 12.0 - 15.0 g/dL   HCT 39.1 36.0 - 46.0 %   MCV 91.6 78.0 - 100.0 fL   MCH 30.7 26.0 - 34.0 pg   MCHC 33.5 30.0 - 36.0 g/dL   RDW 14.3 11.5 - 15.5 %   Platelets 307 150 - 400 K/uL   Neutrophils Relative % 76 %   Neutro Abs 8.7 (H) 1.7 - 7.7 K/uL   Lymphocytes Relative 19 %   Lymphs Abs 2.1 0.7 - 4.0 K/uL   Monocytes Relative 4 %   Monocytes Absolute 0.5 0.1 - 1.0 K/uL   Eosinophils Relative 1 %   Eosinophils Absolute 0.1 0.0 - 0.7 K/uL   Basophils Relative 0 %   Basophils Absolute 0.0 0.0 - 0.1 K/uL  Basic metabolic panel  Result Value Ref Range   Sodium 139 135 - 145 mmol/L   Potassium 3.5 3.5 - 5.1 mmol/L   Chloride 106 101 - 111 mmol/L   CO2 26 22 - 32 mmol/L   Glucose, Bld 121 (H) 65 - 99 mg/dL   BUN 8 6 - 20 mg/dL   Creatinine, Ser 0.69 0.44 - 1.00 mg/dL   Calcium 8.6 (L) 8.9 - 10.3 mg/dL   GFR calc non Af Amer >60 >60 mL/min   GFR calc Af Amer >60 >60 mL/min   Anion gap 7 5 - 15  CBC  Result Value Ref Range   WBC 12.1 (H) 4.0 - 10.5 K/uL   RBC 4.15 3.87 - 5.11 MIL/uL   Hemoglobin 12.6 12.0 - 15.0 g/dL   HCT 38.2 36.0 - 46.0 %   MCV 92.0 78.0 - 100.0 fL    MCH 30.4 26.0 - 34.0 pg   MCHC 33.0 30.0 - 36.0 g/dL   RDW 14.5 11.5 - 15.5 %   Platelets 307 150 - 400 K/uL  Magnesium  Result Value Ref Range   Magnesium 1.8 1.7 - 2.4 mg/dL  Basic metabolic panel  Result Value Ref Range   Sodium 137 135 - 145 mmol/L   Potassium 3.6 3.5 - 5.1 mmol/L   Chloride 102 101 - 111 mmol/L   CO2 25 22 - 32 mmol/L   Glucose, Bld 114 (H) 65 - 99 mg/dL   BUN 8 6 - 20 mg/dL   Creatinine, Ser 0.72 0.44 - 1.00 mg/dL   Calcium 8.8 (L) 8.9 - 10.3 mg/dL   GFR calc non Af Amer >60 >60 mL/min   GFR calc Af Amer >60 >60 mL/min   Anion gap 10 5 - 15  CBC  Result Value Ref Range   WBC 10.7 (H) 4.0 - 10.5 K/uL   RBC 4.22 3.87 - 5.11 MIL/uL   Hemoglobin 13.0 12.0 - 15.0 g/dL   HCT 38.7 36.0 - 46.0 %   MCV 91.7 78.0 - 100.0 fL   MCH 30.8 26.0 - 34.0 pg   MCHC 33.6 30.0 - 36.0 g/dL   RDW 14.2 11.5 - 15.5 %   Platelets 314 150 - 400 K/uL  Magnesium  Result Value Ref Range   Magnesium 1.8 1.7 - 2.4 mg/dL  Hemoglobin A1c  Result Value Ref Range   Hgb A1c MFr Bld 5.9 (H) 4.8 - 5.6 %  Mean Plasma Glucose 123 mg/dL  Basic metabolic panel  Result Value Ref Range   Sodium 137 135 - 145 mmol/L   Potassium 3.6 3.5 - 5.1 mmol/L   Chloride 100 (L) 101 - 111 mmol/L   CO2 26 22 - 32 mmol/L   Glucose, Bld 108 (H) 65 - 99 mg/dL   BUN 8 6 - 20 mg/dL   Creatinine, Ser 0.69 0.44 - 1.00 mg/dL   Calcium 8.9 8.9 - 10.3 mg/dL   GFR calc non Af Amer >60 >60 mL/min   GFR calc Af Amer >60 >60 mL/min   Anion gap 11 5 - 15  Magnesium  Result Value Ref Range   Magnesium 1.8 1.7 - 2.4 mg/dL  CBC  Result Value Ref Range   WBC 9.7 4.0 - 10.5 K/uL   RBC 4.27 3.87 - 5.11 MIL/uL   Hemoglobin 13.0 12.0 - 15.0 g/dL   HCT 39.2 36.0 - 46.0 %   MCV 91.8 78.0 - 100.0 fL   MCH 30.4 26.0 - 34.0 pg   MCHC 33.2 30.0 - 36.0 g/dL   RDW 14.0 11.5 - 15.5 %   Platelets 325 150 - 400 K/uL   Ct Abdomen Pelvis W Contrast  Result Date: 11/13/2016 CLINICAL DATA:  Acute generalized abdominal  pain. EXAM: CT ABDOMEN AND PELVIS WITH CONTRAST TECHNIQUE: Multidetector CT imaging of the abdomen and pelvis was performed using the standard protocol following bolus administration of intravenous contrast. CONTRAST:  150mL ISOVUE-300 IOPAMIDOL (ISOVUE-300) INJECTION 61% COMPARISON:  CT scan of June 28, 2015. FINDINGS: Lower chest: No acute abnormality. Hepatobiliary: No focal liver abnormality is seen. Status post cholecystectomy. No biliary dilatation. Pancreas: Unremarkable. No pancreatic ductal dilatation or surrounding inflammatory changes. Spleen: Normal in size without focal abnormality. Adrenals/Urinary Tract: Adrenal glands are unremarkable. No hydronephrosis or renal obstruction is noted. 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 Urinary bladder is unremarkable. Stomach/Bowel: The appendix is not clearly identified, but no inflammation is seen in the right lower quadrant. No colonic dilatation is noted. Dilated small bowel loops are noted in the left lower quadrant and pelvis with fecalization noted. Transition zone is noted in the right side of the pelvis best seen on image number 69 of series 2. The small bowel beyond the transition zone is nondilated, but demonstrates possible enhancing walls suggesting inflammation. Vascular/Lymphatic: Aortic atherosclerosis. No enlarged abdominal or pelvic lymph nodes. Reproductive: Status post hysterectomy. No adnexal masses. Other: No abdominal wall hernia or abnormality. No abdominopelvic ascites. Musculoskeletal: No acute or significant osseous findings. IMPRESSION: Dilated small bowel loops are noted in the left lower quadrant of the abdomen and pelvis with fecalization, secondary to transition zone seen in the right pelvis. The ileum beyond the transition zone is nondilated but did demonstrate possible wall enhancement, suggesting active inflammation. It is uncertain if this partial  obstruction may be due to a adhesion or inflammatory bowel disease such as Crohn's disease. Clinical correlation is recommended. Aortic atherosclerosis. Electronically Signed   By: Marijo Conception, M.D.   On: 11/13/2016 15:08   Dg Abd 2 Views  Result Date: 11/16/2016 CLINICAL DATA:  Small bowel obstruction. EXAM: ABDOMEN - 2 VIEW COMPARISON:  CT scan Nov 13, 2016 FINDINGS: The lung bases are normal. No free air or portal venous gas. Air-filled dilated loops of small bowel are worsened compared to the scout film from Nov 13, 2016. There is a paucity of colonic gas.  Degenerative changes in the hips. IMPRESSION: Worsening small bowel obstruction. Electronically Signed   By: Dorise Bullion III M.D   On: 11/16/2016 09:18    EKG  EKG Interpretation None       Radiology Dg Abd 2 Views  Result Date: 11/16/2016 CLINICAL DATA:  Small bowel obstruction. EXAM: ABDOMEN - 2 VIEW COMPARISON:  CT scan Nov 13, 2016 FINDINGS: The lung bases are normal. No free air or portal venous gas. Air-filled dilated loops of small bowel are worsened compared to the scout film from Nov 13, 2016. There is a paucity of colonic gas. Degenerative changes in the hips. IMPRESSION: Worsening small bowel obstruction. Electronically Signed   By: Dorise Bullion III M.D   On: 11/16/2016 09:18    Procedures Procedures (including critical care time)  Medications Ordered in ED Medications - No data to display   Initial Impression / Assessment and Plan / ED Course  I have reviewed the triage vital signs and the nursing notes.  Pertinent labs & imaging results that were available during my care of the patient were reviewed by me and considered in my medical decision making (see chart for details).  Reviewed labs and imaging studies from [facility].  todays plain abd xrays showed worsening sbo.   Given worsening appearance films, and recurrent emesis and distension, will place ng, iv ns bolus, gen surgery consulted.    Discussed with Dr Hulen Skains, general surgery - he requests admit to medical service, he will see in consult tonight once out of OR.  Medical service consulted for admission.    Final Clinical Impressions(s) / ED Diagnoses   Final diagnoses:  None    New Prescriptions New Prescriptions   No medications on file     Lajean Saver, MD 11/16/16 2002

## 2016-11-16 NOTE — ED Triage Notes (Signed)
To ED via pov for further eval of abd pain and dx sbo. Pt was discharged from [facility] prior to coming to [facility]. Pt was admitted there for past 4 days. Daughter states pt isn't getting any better so they wanted to come to [facility] for another opinion. States they did see Dr Arnoldo Morale with surgery there and told they were allowing her abd to rest. Pt had multiple suppositories and an enema this am with minimal relief. Pt vomiting in triage - appears to be bile.

## 2016-11-16 NOTE — Progress Notes (Signed)
Patient and Family decided to leave AMA to go to [facility]. Family concerned about Patient abdominal pain and distention. Family concerned about patient blood pressure and medications. MD made aware. IV removed, patient tolerated well.

## 2016-11-17 ENCOUNTER — Inpatient Hospital Stay (HOSPITAL_COMMUNITY): Payer: PPO

## 2016-11-17 DIAGNOSIS — I10 Essential (primary) hypertension: Secondary | ICD-10-CM

## 2016-11-17 DIAGNOSIS — R7302 Impaired glucose tolerance (oral): Secondary | ICD-10-CM

## 2016-11-17 DIAGNOSIS — R001 Bradycardia, unspecified: Secondary | ICD-10-CM

## 2016-11-17 DIAGNOSIS — E038 Other specified hypothyroidism: Secondary | ICD-10-CM

## 2016-11-17 LAB — CBC WITH DIFFERENTIAL/PLATELET
BASOS ABS: 0 10*3/uL (ref 0.0–0.1)
BASOS PCT: 0 %
Eosinophils Absolute: 0 10*3/uL (ref 0.0–0.7)
Eosinophils Relative: 0 %
HEMATOCRIT: 38.7 % (ref 36.0–46.0)
HEMOGLOBIN: 12.7 g/dL (ref 12.0–15.0)
Lymphocytes Relative: 15 %
Lymphs Abs: 1.7 10*3/uL (ref 0.7–4.0)
MCH: 30 pg (ref 26.0–34.0)
MCHC: 32.8 g/dL (ref 30.0–36.0)
MCV: 91.3 fL (ref 78.0–100.0)
MONO ABS: 1.2 10*3/uL — AB (ref 0.1–1.0)
Monocytes Relative: 12 %
NEUTROS ABS: 7.8 10*3/uL — AB (ref 1.7–7.7)
NEUTROS PCT: 73 %
Platelets: 310 10*3/uL (ref 150–400)
RBC: 4.24 MIL/uL (ref 3.87–5.11)
RDW: 14.1 % (ref 11.5–15.5)
WBC: 10.7 10*3/uL — ABNORMAL HIGH (ref 4.0–10.5)

## 2016-11-17 LAB — COMPREHENSIVE METABOLIC PANEL
ALK PHOS: 73 U/L (ref 38–126)
ALT: 24 U/L (ref 14–54)
ANION GAP: 10 (ref 5–15)
AST: 30 U/L (ref 15–41)
Albumin: 3.3 g/dL — ABNORMAL LOW (ref 3.5–5.0)
BILIRUBIN TOTAL: 1.2 mg/dL (ref 0.3–1.2)
BUN: 8 mg/dL (ref 6–20)
CO2: 25 mmol/L (ref 22–32)
Calcium: 8.8 mg/dL — ABNORMAL LOW (ref 8.9–10.3)
Chloride: 101 mmol/L (ref 101–111)
Creatinine, Ser: 0.71 mg/dL (ref 0.44–1.00)
GFR calc non Af Amer: >60 mL/min (ref >60)
Glucose, Bld: 103 mg/dL — ABNORMAL HIGH (ref 65–99)
Potassium: 3.6 mmol/L (ref 3.5–5.1)
Sodium: 136 mmol/L (ref 135–145)
TOTAL PROTEIN: 6.6 g/dL (ref 6.5–8.1)

## 2016-11-17 LAB — TSH: TSH: 6.838 u[IU]/mL — AB (ref 0.350–4.500)

## 2016-11-17 LAB — MAGNESIUM: MAGNESIUM: 1.6 mg/dL — AB (ref 1.7–2.4)

## 2016-11-17 LAB — PHOSPHORUS: PHOSPHORUS: 3.1 mg/dL (ref 2.5–4.6)

## 2016-11-17 MED ORDER — LIDOCAINE VISCOUS 2 % MT SOLN
OROMUCOSAL | Status: AC
  Filled 2016-11-17: qty 15

## 2016-11-17 MED ORDER — ORAL CARE MOUTH RINSE
15.0000 mL | Freq: Two times a day (BID) | OROMUCOSAL | Status: DC
  Administered 2016-11-17 – 2016-11-19 (×6): 15 mL via OROMUCOSAL

## 2016-11-17 MED ORDER — IOPAMIDOL (ISOVUE-300) INJECTION 61%
INTRAVENOUS | Status: AC
  Filled 2016-11-17: qty 50

## 2016-11-17 MED ORDER — SODIUM CHLORIDE 0.45 % IV SOLN
INTRAVENOUS | Status: DC
  Administered 2016-11-17 – 2016-11-18 (×2): via INTRAVENOUS

## 2016-11-17 MED ORDER — CHLORHEXIDINE GLUCONATE 0.12 % MT SOLN
15.0000 mL | Freq: Two times a day (BID) | OROMUCOSAL | Status: DC
  Administered 2016-11-17 – 2016-11-20 (×6): 15 mL via OROMUCOSAL
  Filled 2016-11-17 (×7): qty 15

## 2016-11-17 NOTE — Progress Notes (Signed)
General Surgery [facility]  Assessment & Plan: Small bowel obstruction, likely secondary to adhesions  NPO, IVF  NG placement unsuccessful - to be done in radiology with fluoro guidance today  Small bowel protocol ordered by Dr. Hulen Skains  Will follow        Earnstine Regal, MD, [facility]       Office: [phone]    Chief Complaint: Small bowel obstruction  Subjective: Patient in bed, comfortable, no NG tube placed  Objective: Vital signs in last 24 hours: Temp:  [98.7 F (37.1 C)-99.6 F (37.6 C)] 99.1 F (37.3 C) (05/27 0535) Pulse Rate:  [50-76] 59 (05/27 0535) Resp:  [13-20] 16 (05/27 0535) BP: (142-228)/(72-103) 195/80 (05/27 0535) SpO2:  [88 %-100 %] 100 % (05/27 0535) Last BM Date: 11/15/16  Intake/Output from previous day: 05/26 0701 - 05/27 0700 In: 361.7 [I.V.:311.7; IV Piggyback:50] Out: -  Intake/Output this shift: No intake/output data recorded.  Physical Exam: HEENT - sclerae clear, mucous membranes moist Neck - soft Chest - clear bilaterally; few rhonchi on right Cor - RRR Abdomen - soft, mild distension; active BS present; minimal tenderness; well healed incisions RUQ and lower midline Ext - no edema, non-tender Neuro - alert & oriented, no focal deficits  Lab Results:   Recent Labs  11/16/16 0603 11/17/16 0418  WBC 9.7 10.7*  HGB 13.0 12.7  HCT 39.2 38.7  PLT 325 310   BMET  Recent Labs  11/16/16 0603 11/17/16 0418  NA 137 136  K 3.6 3.6  CL 100* 101  CO2 26 25  GLUCOSE 108* 103*  BUN 8 8  CREATININE 0.69 0.71  CALCIUM 8.9 8.8*   PT/INR No results for input(s): LABPROT, INR in the last 72 hours. Comprehensive Metabolic Panel:    Component Value Date/Time   NA 136 11/17/2016 0418   NA 137 11/16/2016 0603   K 3.6 11/17/2016 0418   K 3.6 11/16/2016 0603   CL 101 11/17/2016 0418   CL 100 (L) 11/16/2016 0603   CO2 25 11/17/2016 0418   CO2 26 11/16/2016 0603   BUN 8  11/17/2016 0418   BUN 8 11/16/2016 0603   CREATININE 0.71 11/17/2016 0418   CREATININE 0.69 11/16/2016 0603   CREATININE 0.83 04/08/2016 0933   CREATININE 0.81 12/06/2015 0934   GLUCOSE 103 (H) 11/17/2016 0418   GLUCOSE 108 (H) 11/16/2016 0603   CALCIUM 8.8 (L) 11/17/2016 0418   CALCIUM 8.9 11/16/2016 0603   AST 30 11/17/2016 0418   AST 23 11/13/2016 1204   ALT 24 11/17/2016 0418   ALT 19 11/13/2016 1204   ALKPHOS 73 11/17/2016 0418   ALKPHOS 71 11/13/2016 1204   BILITOT 1.2 11/17/2016 0418   BILITOT 0.7 11/13/2016 1204   PROT 6.6 11/17/2016 0418   PROT 7.7 11/13/2016 1204   ALBUMIN 3.3 (L) 11/17/2016 0418   ALBUMIN 3.9 11/13/2016 1204    Studies/Results: Dg Abd 2 Views  Result Date: 11/16/2016 CLINICAL DATA:  Small bowel obstruction. EXAM: ABDOMEN - 2 VIEW COMPARISON:  CT scan Nov 13, 2016 FINDINGS: The lung bases are normal. No free air or portal venous gas. Air-filled dilated loops of small bowel are worsened compared to the scout film from Nov 13, 2016. There is a paucity of colonic gas. Degenerative changes in the hips. IMPRESSION: Worsening small bowel obstruction. Electronically Signed   By: Dorise Bullion III M.D   On: 11/16/2016 09:18  Alaia Lordi M 11/17/2016  Patient ID: Adrienne [name], female   DOB: 24-May-1946, 71 y.o.   MRN: [id number]

## 2016-11-17 NOTE — Progress Notes (Signed)
PROGRESS NOTE    Adrienne [name]  TSV:[id number] DOB: 05/22/1946 DOA: 11/16/2016 PCP: Rennis Golden    Brief Narrative:  71 yo female presents with the chief complain of abdominal pain. Patient known to have diastolic CHF, HTN and hypothyroidism. Recent hospitalization 05/23 at [facility] for small bowel obstruction, left the hospital against medical advice 5/26, presented to this hospital looking for second opinion. Abdominal pain since 92/33, colicky in nature associated with nausea and vomiting. On the initial physical examination her blood pressure 183/103, heart rate 63 with respiratory rate 20, temperature 99.6 with oxygen saturation 95%. Moist mucous membranes, lungs clear to auscultation, no wheezing or rales, heart with bradycardia, no murmurs or gallop, abdomen with mild diffuse tenderness, decrease bowel sounds with no guarding. No lower extremity edema. Patient admitted with the working diagnosis of persistent small bowel obstruction, NG tube order to be placed by IR and surgery consulted.   Assessment & Plan:   Principal Problem:   SBO (small bowel obstruction) (HCC) Active Problems:   Hypertension   Dyslipidemia   Chronic systolic CHF (congestive heart failure) (HCC)   Hypothyroidism   Bradycardia   Impaired glucose tolerance   1. Small bowel obstruction due to adhesions. Will continue conservative medical care with IV fluids, IV analgesics and IV antiemetics, pending NG tube placement per IR, difficult to place, with no radiographic guidance. Antiacid therapy with famotidine. Follow on abdominal series, contrast study, follow on surgical recommendations.   2. Bradycardia. Continue close telemetry monitoring. Not on AV blockers.  3. HTN. Continue as needed hydralazine, blood pressure 007 systolic. At home on coreg and amlodipine.   4. Diastolic Heart Failure, chronic and stable. Clinically euvolemic, will continue gentle hydration with IV fluids,  echocardiogram from 2015 with preserved LV systolic function. Hold on coreg  5. Hypothyroid. Continue levothyroxine IV. Patient is npo.   6. Impaired glucose tolerance. Serum glucose is 103, will continue npo due to bowel obstruction, check bmp in am.     DVT prophylaxis: sq heparin Code Status: full  Family Communication:  Disposition Plan:home .   Consultants:   Surgery   IR  Procedures:   Antimicrobials:    Subjective: Patient with abdominal distention and moderate pain,. No nausea or vomiting, no flatus or bowel movement.   Objective: Vitals:   11/16/16 2230 11/16/16 2245 11/16/16 2328 11/17/16 0535  BP: (!) 156/80 (!) 142/88 (!) 173/72 (!) 195/80  Pulse: 66 (!) 59 (!) 57 (!) 59  Resp: 16 13 16 16   Temp:   99 F (37.2 C) 99.1 F (37.3 C)  TempSrc:   Oral Oral  SpO2: 98% 96% 99% 100%    Intake/Output Summary (Last 24 hours) at 11/17/16 1015 Last data filed at 11/17/16 0300  Gross per 24 hour  Intake           361.67 ml  Output                0 ml  Net           361.67 ml   There were no vitals filed for this visit.  Examination:  General exam: deconditioned E ENT. Mild pallor, no icterus Respiratory system: Clear to auscultation. Respiratory effort normal. No wheezing, rales or rhonchi.  Cardiovascular system: S1 & S2 heard, RRR. No JVD, murmurs, rubs, gallops or clicks. No pedal edema. Gastrointestinal system: Abdomen distended, soft and nontender,, tympanic . No organomegaly or masses felt. Normal bowel sounds heard. Central nervous system:  Alert and oriented. No focal neurological deficits. Extremities: Symmetric 5 x 5 power. Skin: No rashes, lesions or ulcers     Data Reviewed: I have personally reviewed following labs and imaging studies  CBC:  Recent Labs Lab 11/13/16 1225 11/14/16 0602 11/15/16 0608 11/16/16 0603 11/17/16 0418  WBC 11.3* 12.1* 10.7* 9.7 10.7*  NEUTROABS 8.7*  --   --   --  7.8*  HGB 13.1 12.6 13.0 13.0 12.7  HCT  39.1 38.2 38.7 39.2 38.7  MCV 91.6 92.0 91.7 91.8 91.3  PLT 307 307 314 325 161   Basic Metabolic Panel:  Recent Labs Lab 11/13/16 1204 11/14/16 0602 11/15/16 0608 11/16/16 0603 11/17/16 0418  NA 140 139 137 137 136  K 4.3 3.5 3.6 3.6 3.6  CL 105 106 102 100* 101  CO2 28 26 25 26 25   GLUCOSE 129* 121* 114* 108* 103*  BUN 11 8 8 8 8   CREATININE 0.84 0.69 0.72 0.69 0.71  CALCIUM 9.5 8.6* 8.8* 8.9 8.8*  MG  --  1.8 1.8 1.8 1.6*  PHOS  --   --   --   --  3.1   GFR: Estimated Creatinine Clearance: 65.2 mL/min (by C-G formula based on SCr of 0.71 mg/dL). Liver Function Tests:  Recent Labs Lab 11/13/16 1204 11/17/16 0418  AST 23 30  ALT 19 24  ALKPHOS 71 73  BILITOT 0.7 1.2  PROT 7.7 6.6  ALBUMIN 3.9 3.3*    Recent Labs Lab 11/13/16 1204  LIPASE 23   No results for input(s): AMMONIA in the last 168 hours. Coagulation Profile: No results for input(s): INR, PROTIME in the last 168 hours. Cardiac Enzymes: No results for input(s): CKTOTAL, CKMB, CKMBINDEX, TROPONINI in the last 168 hours. BNP (last 3 results) No results for input(s): PROBNP in the last 8760 hours. HbA1C: No results for input(s): HGBA1C in the last 72 hours. CBG: No results for input(s): GLUCAP in the last 168 hours. Lipid Profile: No results for input(s): CHOL, HDL, LDLCALC, TRIG, CHOLHDL, LDLDIRECT in the last 72 hours. Thyroid Function Tests:  Recent Labs  11/17/16 0418  TSH 6.838*   Anemia Panel: No results for input(s): VITAMINB12, FOLATE, FERRITIN, TIBC, IRON, RETICCTPCT in the last 72 hours. Sepsis Labs: No results for input(s): PROCALCITON, LATICACIDVEN in the last 168 hours.  No results found for this or any previous visit (from the past 240 hour(s)).       Radiology Studies: Dg Abd 2 Views  Result Date: 11/16/2016 CLINICAL DATA:  Small bowel obstruction. EXAM: ABDOMEN - 2 VIEW COMPARISON:  CT scan Nov 13, 2016 FINDINGS: The lung bases are normal. No free air or portal  venous gas. Air-filled dilated loops of small bowel are worsened compared to the scout film from Nov 13, 2016. There is a paucity of colonic gas. Degenerative changes in the hips. IMPRESSION: Worsening small bowel obstruction. Electronically Signed   By: Dorise Bullion III M.D   On: 11/16/2016 09:18        Scheduled Meds: . chlorhexidine  15 mL Mouth Rinse BID  . diatrizoate meglumine-sodium  90 mL Per NG tube Once  . heparin  5,000 Units Subcutaneous Q8H  . levothyroxine  40 mcg Intravenous Daily  . mouth rinse  15 mL Mouth Rinse q12n4p   Continuous Infusions: . 0.9 % NaCl with KCl 20 mEq / L 100 mL/hr at 11/16/16 2353  . famotidine (PEPCID) IV Stopped (11/17/16 0015)     LOS: 1 day  Marisol Glazer Gerome Apley, MD [facility] Pager [phone]  If 7PM-7AM, please contact night-coverage [email or web address] Password [email or web address] 11/17/2016, 10:15 AM

## 2016-11-18 ENCOUNTER — Inpatient Hospital Stay (HOSPITAL_COMMUNITY): Payer: PPO

## 2016-11-18 ENCOUNTER — Encounter (HOSPITAL_COMMUNITY): Payer: Self-pay | Admitting: General Practice

## 2016-11-18 DIAGNOSIS — I5022 Chronic systolic (congestive) heart failure: Secondary | ICD-10-CM

## 2016-11-18 DIAGNOSIS — E785 Hyperlipidemia, unspecified: Secondary | ICD-10-CM

## 2016-11-18 LAB — BASIC METABOLIC PANEL
ANION GAP: 12 (ref 5–15)
BUN: 8 mg/dL (ref 6–20)
CHLORIDE: 102 mmol/L (ref 101–111)
CO2: 22 mmol/L (ref 22–32)
Calcium: 8.6 mg/dL — ABNORMAL LOW (ref 8.9–10.3)
Creatinine, Ser: 0.85 mg/dL (ref 0.44–1.00)
GFR calc non Af Amer: >60 mL/min (ref >60)
Glucose, Bld: 69 mg/dL (ref 65–99)
POTASSIUM: 3.2 mmol/L — AB (ref 3.5–5.1)
Sodium: 136 mmol/L (ref 135–145)

## 2016-11-18 LAB — GLUCOSE, CAPILLARY
GLUCOSE-CAPILLARY: 69 mg/dL (ref 65–99)
GLUCOSE-CAPILLARY: 75 mg/dL (ref 65–99)
Glucose-Capillary: 76 mg/dL (ref 65–99)
Glucose-Capillary: 80 mg/dL (ref 65–99)

## 2016-11-18 LAB — CBC WITH DIFFERENTIAL/PLATELET
BASOS ABS: 0 10*3/uL (ref 0.0–0.1)
BASOS PCT: 0 %
Eosinophils Absolute: 0.1 10*3/uL (ref 0.0–0.7)
Eosinophils Relative: 1 %
HCT: 37.3 % (ref 36.0–46.0)
HEMOGLOBIN: 12.4 g/dL (ref 12.0–15.0)
Lymphocytes Relative: 30 %
Lymphs Abs: 2.8 10*3/uL (ref 0.7–4.0)
MCH: 30.5 pg (ref 26.0–34.0)
MCHC: 33.2 g/dL (ref 30.0–36.0)
MCV: 91.6 fL (ref 78.0–100.0)
MONOS PCT: 11 %
Monocytes Absolute: 1 10*3/uL (ref 0.1–1.0)
NEUTROS ABS: 5.3 10*3/uL (ref 1.7–7.7)
NEUTROS PCT: 58 %
Platelets: 289 10*3/uL (ref 150–400)
RBC: 4.07 MIL/uL (ref 3.87–5.11)
RDW: 14.2 % (ref 11.5–15.5)
WBC: 9.2 10*3/uL (ref 4.0–10.5)

## 2016-11-18 MED ORDER — DEXTROSE-NACL 5-0.9 % IV SOLN
INTRAVENOUS | Status: DC
  Administered 2016-11-18 – 2016-11-20 (×4): via INTRAVENOUS

## 2016-11-18 MED ORDER — POTASSIUM CHLORIDE 10 MEQ/100ML IV SOLN
10.0000 meq | INTRAVENOUS | Status: AC
  Administered 2016-11-18 (×3): 10 meq via INTRAVENOUS
  Filled 2016-11-18 (×3): qty 100

## 2016-11-18 NOTE — Progress Notes (Signed)
[facility]                                                                              Patient Demographics  Adrienne [name], is a 71 y.o. female, DOB - [birth year], FEO:[id number]  Admit date - 11/16/2016   Admitting Physician Reubin Milan, MD  Outpatient Primary MD for the patient is Rennis Golden  Outpatient specialists:   LOS - 2  days    Chief Complaint  Patient presents with  . Abdominal Pain  . Emesis       Brief summary   71 yo female With a diastolic CHF, hypertension, hypothyroidism resented with abdominal pain. Recent hospitalization 05/23 at [facility] for small bowel obstruction, left the hospital against medical advice 5/26. presented to [facility] for second opinion. Abdominal pain persistent since 32/54, colicky in nature associated with nausea and vomiting. Abdominal exam showed diffuse tenderness with decreased bowel sounds. Abdominal x-ray showed worsening small bowel obstruction.Patient was admitted with persistent SBO.   Assessment & Plan    Principal Problem:   SBO (small bowel obstruction) (HCC) - Gen. surgery following, continue conservative management - Continue IV fluids, IV antiemetics, IV pain medications - NG tube placed to intermittent wall suction,  - abdominal x-ray today showed continued improvement in the degree of small bowel dilatation  Active Problems:   Hypertension - Continue as needed hydralazine IV with parameters - Hold Coreg, amlodipine    Dyslipidemia - Hold omega 3 fatty acids    Chronic diastolic  CHF (congestive heart failure) (HCC) - Currently stable, euvolemic, 2-D echo from 2015 with preserved LV systolic function, EF 98-26% - Hold  Coreg    Hypothyroidism - Continue IV Synthroid - TSH 6.8    Bradycardia - Continue telemetry, not an AV blockers, Coreg on hold   Code Status: full  DVT Prophylaxis:   heparin  Family Communication: Discussed in detail with the patient, all imaging results, lab  results explained to the patient    Disposition Plan:   Time Spent in minutes   25 minutes  Procedures:    Consultants:   Gen. surgery  Antimicrobials:      Medications  Scheduled Meds: . chlorhexidine  15 mL Mouth Rinse BID  . diatrizoate meglumine-sodium  90 mL Per NG tube Once  . heparin  5,000 Units Subcutaneous Q8H  . levothyroxine  40 mcg Intravenous Daily  . mouth rinse  15 mL Mouth Rinse q12n4p   Continuous Infusions: . sodium chloride Stopped (11/18/16 0648)  . dextrose 5 % and 0.9% NaCl 75 mL/hr at 11/18/16 0648  . famotidine (PEPCID) IV 20 mg (11/18/16 1013)   PRN Meds:.hydrALAZINE, HYDROmorphone (DILAUDID) injection, ondansetron **OR** ondansetron (ZOFRAN) IV, promethazine   Antibiotics   Anti-infectives    None        Subjective:   Adrienne [name] was seen and examined today. Still has abdominal distention, abdominal pain 6/10, diffuse. Denies any nausea or vomiting. Feeling overall better after NG tube placed. No fevers or chills. Patient denies dizziness, chest pain, shortness of breath,  new weakness, numbess, tingling. No acute events overnight.    Objective:  Vitals:   11/17/16 1406 11/17/16 2101 11/18/16 0501 11/18/16 0900  BP: (!) 172/85 (!) 189/82 (!) 141/77   Pulse: 73 (!) 56 72   Resp: 17 17 17    Temp: 98.5 F (36.9 C) 98.8 F (37.1 C) 99.3 F (37.4 C)   TempSrc: Oral Oral Oral   SpO2: 100% 100% 100%   Weight:    76.7 kg (169 lb)  Height:    5\' 3"  (1.6 m)    Intake/Output Summary (Last 24 hours) at 11/18/16 1132 Last data filed at 11/18/16 1004  Gross per 24 hour  Intake           1082.5 ml  Output             1200 ml  Net           -117.5 ml     Wt Readings from Last 3 Encounters:  11/18/16 76.7 kg (169 lb)  11/15/16 76.8 kg (169 lb 5 oz)  10/28/16 78.6 kg (173 lb 3.2 oz)     Exam  General: Alert and oriented x 3, NAD, NGT+  HEENT:  PERRLA, EOMI, Anicteric Sclera, mucous membranes moist.   Neck: Supple, no  JVD, no masses  Cardiovascular: S1 S2 auscultated, no rubs, murmurs or gallops. Regular rate and rhythm.  Respiratory: Clear to auscultation bilaterally, no wheezing, rales or rhonchi  Gastrointestinal: Soft, distended, minimally tender, bowel sounds present  Ext: no cyanosis clubbing or edema  Neuro: AAOx3, Cr N's II- XII. Strength 5/5 upper and lower extremities bilaterally  Skin: No rashes  Psych: Normal affect and demeanor, alert and oriented x3    Data Reviewed:  I have personally reviewed following labs and imaging studies  Micro Results No results found for this or any previous visit (from the past 240 hour(s)).  Radiology Reports Ct Abdomen Pelvis W Contrast  Result Date: 11/13/2016 CLINICAL DATA:  Acute generalized abdominal pain. EXAM: CT ABDOMEN AND PELVIS WITH CONTRAST TECHNIQUE: Multidetector CT imaging of the abdomen and pelvis was performed using the standard protocol following bolus administration of intravenous contrast. CONTRAST:  129mL ISOVUE-300 IOPAMIDOL (ISOVUE-300) INJECTION 61% COMPARISON:  CT scan of June 28, 2015. FINDINGS: Lower chest: No acute abnormality. Hepatobiliary: No focal liver abnormality is seen. Status post cholecystectomy. No biliary dilatation. Pancreas: Unremarkable. No pancreatic ductal dilatation or surrounding inflammatory changes. Spleen: Normal in size without focal abnormality. Adrenals/Urinary Tract: Adrenal glands are unremarkable. No hydronephrosis or renal obstruction is noted. 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 Urinary bladder is unremarkable. Stomach/Bowel: The appendix is not clearly identified, but no inflammation is seen in the right lower quadrant. No colonic dilatation is noted. Dilated small bowel loops are noted in the left lower quadrant and pelvis with fecalization noted. Transition zone is noted in the right side of the pelvis best seen on image  number 69 of series 2. The small bowel beyond the transition zone is nondilated, but demonstrates possible enhancing walls suggesting inflammation. Vascular/Lymphatic: Aortic atherosclerosis. No enlarged abdominal or pelvic lymph nodes. Reproductive: Status post hysterectomy. No adnexal masses. Other: No abdominal wall hernia or abnormality. No abdominopelvic ascites. Musculoskeletal: No acute or significant osseous findings. IMPRESSION: Dilated small bowel loops are noted in the left lower quadrant of the abdomen and pelvis with fecalization, secondary to transition zone seen in the right pelvis. The ileum beyond the transition zone is nondilated but did demonstrate possible wall enhancement, suggesting active inflammation.  It is uncertain if this partial obstruction may be due to a adhesion or inflammatory bowel disease such as Crohn's disease. Clinical correlation is recommended. Aortic atherosclerosis. Electronically Signed   By: Marijo Conception, M.D.   On: 11/13/2016 15:08   Dg Abd 2 Views  Result Date: 11/16/2016 CLINICAL DATA:  Small bowel obstruction. EXAM: ABDOMEN - 2 VIEW COMPARISON:  CT scan Nov 13, 2016 FINDINGS: The lung bases are normal. No free air or portal venous gas. Air-filled dilated loops of small bowel are worsened compared to the scout film from Nov 13, 2016. There is a paucity of colonic gas. Degenerative changes in the hips. IMPRESSION: Worsening small bowel obstruction. Electronically Signed   By: Dorise Bullion III M.D   On: 11/16/2016 09:18   Dg Abd Portable 1v  Result Date: 11/18/2016 CLINICAL DATA:  Followup small bowel obstruction EXAM: PORTABLE ABDOMEN - 1 VIEW COMPARISON:  11/17/2016 FINDINGS: Contrast material administered previously is now only nearly completely in the colon periods small bowel dilatation remains although continues improvement from the prior exam. Nasogastric catheter is seen. IMPRESSION: Continued improvement in the degree of small bowel dilatation.  Electronically Signed   By: Inez Catalina M.D.   On: 11/18/2016 10:01   Dg Abd Portable 1v-small Bowel Obstruction Protocol-initial, 8 Hr Delay  Result Date: 11/17/2016 CLINICAL DATA:  8 hour delay film following contrast administration EXAM: PORTABLE ABDOMEN - 1 VIEW COMPARISON:  11/17/2016 FINDINGS: Contrast material is noted scattered throughout the small bowel. Contrast material is also noted within the colon. Continued dilatation of the small bowel is seen. IMPRESSION: Contrast has reached the colon at 8 hours. Persistent small bowel dilatation is noted. Electronically Signed   By: Inez Catalina M.D.   On: 11/17/2016 21:18   Dg Addison Bailey G Tube Plc W/fl W/rad  Result Date: 11/17/2016 CLINICAL DATA:  NG tube placement for SBO EXAM: NASO G TUBE PLACEMENT WITH FL AND WITH RAD CONTRAST:  None. FLUOROSCOPY TIME:  Fluoroscopy Time:  6 seconds Radiation Exposure Index (if provided by the fluoroscopic device): 0.80 mGy Number of Acquired Spot Images: 2 screen captures COMPARISON:  None. FINDINGS: NG tube placed via left nares into the stomach. Fluoroscopic screen capture taken to confirm intragastric placement. NG tube terminates in the distal gastric body. IMPRESSION: Successful NG tube placement, terminating in the distal gastric body. Electronically Signed   By: Julian Hy M.D.   On: 11/17/2016 12:40    Lab Data:  CBC:  Recent Labs Lab 11/13/16 1225 11/14/16 0602 11/15/16 3532 11/16/16 0603 11/17/16 0418 11/18/16 0508  WBC 11.3* 12.1* 10.7* 9.7 10.7* 9.2  NEUTROABS 8.7*  --   --   --  7.8* 5.3  HGB 13.1 12.6 13.0 13.0 12.7 12.4  HCT 39.1 38.2 38.7 39.2 38.7 37.3  MCV 91.6 92.0 91.7 91.8 91.3 91.6  PLT 307 307 314 325 310 992   Basic Metabolic Panel:  Recent Labs Lab 11/14/16 0602 11/15/16 0608 11/16/16 0603 11/17/16 0418 11/18/16 0508  NA 139 137 137 136 136  K 3.5 3.6 3.6 3.6 3.2*  CL 106 102 100* 101 102  CO2 26 25 26 25 22   GLUCOSE 121* 114* 108* 103* 69  BUN 8 8 8 8 8     CREATININE 0.69 0.72 0.69 0.71 0.85  CALCIUM 8.6* 8.8* 8.9 8.8* 8.6*  MG 1.8 1.8 1.8 1.6*  --   PHOS  --   --   --  3.1  --    GFR: Estimated Creatinine  Clearance: 61.2 mL/min (by C-G formula based on SCr of 0.85 mg/dL). Liver Function Tests:  Recent Labs Lab 11/13/16 1204 11/17/16 0418  AST 23 30  ALT 19 24  ALKPHOS 71 73  BILITOT 0.7 1.2  PROT 7.7 6.6  ALBUMIN 3.9 3.3*    Recent Labs Lab 11/13/16 1204  LIPASE 23   No results for input(s): AMMONIA in the last 168 hours. Coagulation Profile: No results for input(s): INR, PROTIME in the last 168 hours. Cardiac Enzymes: No results for input(s): CKTOTAL, CKMB, CKMBINDEX, TROPONINI in the last 168 hours. BNP (last 3 results) No results for input(s): PROBNP in the last 8760 hours. HbA1C: No results for input(s): HGBA1C in the last 72 hours. CBG:  Recent Labs Lab 11/18/16 0003 11/18/16 0635  GLUCAP 76 69   Lipid Profile: No results for input(s): CHOL, HDL, LDLCALC, TRIG, CHOLHDL, LDLDIRECT in the last 72 hours. Thyroid Function Tests:  Recent Labs  11/17/16 0418  TSH 6.838*   Anemia Panel: No results for input(s): VITAMINB12, FOLATE, FERRITIN, TIBC, IRON, RETICCTPCT in the last 72 hours. Urine analysis:    Component Value Date/Time   COLORURINE YELLOW 11/13/2016 [place] 11/13/2016 1156   LABSPEC 1.014 11/13/2016 1156   PHURINE 6.0 11/13/2016 1156   GLUCOSEU NEGATIVE 11/13/2016 [place] 11/13/2016 1156   BILIRUBINUR NEGATIVE 11/13/2016 1156   KETONESUR NEGATIVE 11/13/2016 1156   PROTEINUR NEGATIVE 11/13/2016 1156   NITRITE NEGATIVE 11/13/2016 [place] 11/13/2016 1156     Iver Miklas M.D. [facility] 11/18/2016, 11:32 AM  Pager: [phone] Between 7am to 7pm - call Pager - [phone]  After 7pm go to [email or web address] - password [email or web address]  Call night coverage person covering after 7pm

## 2016-11-18 NOTE — Progress Notes (Signed)
Nurse tech reported CBG is 69.  Noted from morning lab work CBG is 69.  Dr. Reece Levy notified.  New orders given.  D5NS @ 75cc/hr started per new orders.  Will report off to Day shift staff to recheck CBG.  Pt. Is alert and asymptomatic.

## 2016-11-18 NOTE — Progress Notes (Signed)
[facility]  Assessment & Plan: Small bowel obstruction, likely secondary to adhesions             NPO, IVF             NG placement successful yesterday in fluoro - continue on suction at this time             Small bowel protocol ordered by Dr. Hulen Skains - AXR last night with contrast to colon  AXR pending this AM             Will follow         Earnstine Regal, MD, [facility]       Office: [phone]    Chief Complaint: Small bowel obstruction  Subjective: Patient in bed, comfortable.  AXR just completed in room.  Objective: Vital signs in last 24 hours: Temp:  [98.5 F (36.9 C)-99.3 F (37.4 C)] 99.3 F (37.4 C) (05/28 0501) Pulse Rate:  [56-73] 72 (05/28 0501) Resp:  [17] 17 (05/28 0501) BP: (141-189)/(77-85) 141/77 (05/28 0501) SpO2:  [100 %] 100 % (05/28 0501) Last BM Date: 11/17/16  Intake/Output from previous day: 05/27 0701 - 05/28 0700 In: 0  Out: 1200 [Urine:400; Emesis/NG output:800] Intake/Output this shift: No intake/output data recorded.  Physical Exam: HEENT - sclerae clear, mucous membranes moist Neck - soft Abdomen - soft without distension; minimal tenderness left mid abdomen; BS present; NG with bilious output Ext - no edema, non-tender Neuro - alert & oriented, no focal deficits  Lab Results:   Recent Labs  11/17/16 0418 11/18/16 0508  WBC 10.7* 9.2  HGB 12.7 12.4  HCT 38.7 37.3  PLT 310 289   BMET  Recent Labs  11/17/16 0418 11/18/16 0508  NA 136 136  K 3.6 3.2*  CL 101 102  CO2 25 22  GLUCOSE 103* 69  BUN 8 8  CREATININE 0.71 0.85  CALCIUM 8.8* 8.6*   PT/INR No results for input(s): LABPROT, INR in the last 72 hours. Comprehensive Metabolic Panel:    Component Value Date/Time   NA 136 11/18/2016 0508   NA 136 11/17/2016 0418   K 3.2 (L) 11/18/2016 0508   K 3.6 11/17/2016 0418   CL 102 11/18/2016 0508   CL 101 11/17/2016 0418   CO2 22  11/18/2016 0508   CO2 25 11/17/2016 0418   BUN 8 11/18/2016 0508   BUN 8 11/17/2016 0418   CREATININE 0.85 11/18/2016 0508   CREATININE 0.71 11/17/2016 0418   CREATININE 0.83 04/08/2016 0933   CREATININE 0.81 12/06/2015 0934   GLUCOSE 69 11/18/2016 0508   GLUCOSE 103 (H) 11/17/2016 0418   CALCIUM 8.6 (L) 11/18/2016 0508   CALCIUM 8.8 (L) 11/17/2016 0418   AST 30 11/17/2016 0418   AST 23 11/13/2016 1204   ALT 24 11/17/2016 0418   ALT 19 11/13/2016 1204   ALKPHOS 73 11/17/2016 0418   ALKPHOS 71 11/13/2016 1204   BILITOT 1.2 11/17/2016 0418   BILITOT 0.7 11/13/2016 1204   PROT 6.6 11/17/2016 0418   PROT 7.7 11/13/2016 1204   ALBUMIN 3.3 (L) 11/17/2016 0418   ALBUMIN 3.9 11/13/2016 1204    Studies/Results: Dg Abd Portable 1v-small Bowel Obstruction Protocol-initial, 8 Hr Delay  Result Date: 11/17/2016 CLINICAL DATA:  8 hour delay film following contrast administration EXAM: PORTABLE ABDOMEN - 1 VIEW COMPARISON:  11/17/2016 FINDINGS: Contrast material is noted scattered throughout the small  bowel. Contrast material is also noted within the colon. Continued dilatation of the small bowel is seen. IMPRESSION: Contrast has reached the colon at 8 hours. Persistent small bowel dilatation is noted. Electronically Signed   By: Inez Catalina M.D.   On: 11/17/2016 21:18   Dg Addison Bailey G Tube Plc W/fl W/rad  Result Date: 11/17/2016 CLINICAL DATA:  NG tube placement for SBO EXAM: NASO G TUBE PLACEMENT WITH FL AND WITH RAD CONTRAST:  None. FLUOROSCOPY TIME:  Fluoroscopy Time:  6 seconds Radiation Exposure Index (if provided by the fluoroscopic device): 0.80 mGy Number of Acquired Spot Images: 2 screen captures COMPARISON:  None. FINDINGS: NG tube placed via left nares into the stomach. Fluoroscopic screen capture taken to confirm intragastric placement. NG tube terminates in the distal gastric body. IMPRESSION: Successful NG tube placement, terminating in the distal gastric body. Electronically Signed   By:  Julian Hy M.D.   On: 11/17/2016 12:40      Lewis M 11/18/2016  Patient ID: Adrienne [name], female   DOB: September 14, 1945, 71 y.o.   MRN: [id number]

## 2016-11-19 ENCOUNTER — Inpatient Hospital Stay (HOSPITAL_COMMUNITY): Payer: PPO

## 2016-11-19 LAB — CBC
HEMATOCRIT: 38.6 % (ref 36.0–46.0)
HEMOGLOBIN: 12.9 g/dL (ref 12.0–15.0)
MCH: 30.5 pg (ref 26.0–34.0)
MCHC: 33.4 g/dL (ref 30.0–36.0)
MCV: 91.3 fL (ref 78.0–100.0)
Platelets: 301 10*3/uL (ref 150–400)
RBC: 4.23 MIL/uL (ref 3.87–5.11)
RDW: 14 % (ref 11.5–15.5)
WBC: 8.9 10*3/uL (ref 4.0–10.5)

## 2016-11-19 LAB — GLUCOSE, CAPILLARY
GLUCOSE-CAPILLARY: 83 mg/dL (ref 65–99)
GLUCOSE-CAPILLARY: 122 mg/dL — AB (ref 65–99)
Glucose-Capillary: 95 mg/dL (ref 65–99)
Glucose-Capillary: 99 mg/dL (ref 65–99)

## 2016-11-19 LAB — BASIC METABOLIC PANEL
ANION GAP: 11 (ref 5–15)
BUN: 5 mg/dL — ABNORMAL LOW (ref 6–20)
CHLORIDE: 104 mmol/L (ref 101–111)
CO2: 23 mmol/L (ref 22–32)
Calcium: 8.8 mg/dL — ABNORMAL LOW (ref 8.9–10.3)
Creatinine, Ser: 0.71 mg/dL (ref 0.44–1.00)
GFR calc non Af Amer: >60 mL/min (ref >60)
Glucose, Bld: 103 mg/dL — ABNORMAL HIGH (ref 65–99)
POTASSIUM: 3 mmol/L — AB (ref 3.5–5.1)
SODIUM: 138 mmol/L (ref 135–145)

## 2016-11-19 MED ORDER — POTASSIUM CHLORIDE 10 MEQ/100ML IV SOLN
10.0000 meq | INTRAVENOUS | Status: AC
  Administered 2016-11-19 (×4): 10 meq via INTRAVENOUS
  Filled 2016-11-19 (×4): qty 100

## 2016-11-19 MED ORDER — BISACODYL 10 MG RE SUPP
10.0000 mg | Freq: Every day | RECTAL | Status: DC
  Administered 2016-11-19: 10 mg via RECTAL
  Filled 2016-11-19 (×2): qty 1

## 2016-11-19 MED ORDER — AMLODIPINE BESYLATE 10 MG PO TABS
10.0000 mg | ORAL_TABLET | Freq: Every day | ORAL | Status: DC
  Administered 2016-11-19 – 2016-11-20 (×2): 10 mg via ORAL
  Filled 2016-11-19 (×2): qty 1

## 2016-11-19 NOTE — Progress Notes (Signed)
Patient ID: Adrienne [name], female   DOB: 04-Oct-1945, 71 y.o.   MRN: [id number]  [facility] Surgery Progress Note     Subjective: CC- SBO Sitting up in bed. States that her abdominal pain is improving. She feels less distended. Denies n/v. She did have a small BM on Sunday, and continues to pass flatus today.  Objective: Vital signs in last 24 hours: Temp:  [98.4 F (36.9 C)-98.9 F (37.2 C)] 98.9 F (37.2 C) (05/29 0523) Pulse Rate:  [50-69] 69 (05/29 0613) Resp:  [18] 18 (05/29 0523) BP: (127-188)/(71-94) 154/74 (05/29 1324) SpO2:  [100 %] 100 % (05/29 0523) Last BM Date: 11/17/16  Intake/Output from previous day: 05/28 0701 - 05/29 0700 In: 1619 [I.V.:1519; IV Piggyback:100] Out: 1350 [Urine:600; Emesis/NG output:750] Intake/Output this shift: Total I/O In: 0  Out: 500 [Urine:500]  PE: Gen:  Alert, NAD, pleasant HEENT - sclerae clear, mucous membranes moist, dentition fair Card:  RRR, no M/G/R heard Pulm:  CTAB, no W/R/R, effort normal Abd: Soft, NT/ND, +BS, no HSM, no hernia Ext:  No erythema, edema, or tenderness BUE/BLE   Lab Results:   Recent Labs  11/18/16 0508 11/19/16 0607  WBC 9.2 8.9  HGB 12.4 12.9  HCT 37.3 38.6  PLT 289 301   BMET  Recent Labs  11/18/16 0508 11/19/16 0607  NA 136 138  K 3.2* 3.0*  CL 102 104  CO2 22 23  GLUCOSE 69 103*  BUN 8 5*  CREATININE 0.85 0.71  CALCIUM 8.6* 8.8*   PT/INR No results for input(s): LABPROT, INR in the last 72 hours. CMP     Component Value Date/Time   NA 138 11/19/2016 0607   K 3.0 (L) 11/19/2016 0607   CL 104 11/19/2016 0607   CO2 23 11/19/2016 0607   GLUCOSE 103 (H) 11/19/2016 0607   BUN 5 (L) 11/19/2016 0607   CREATININE 0.71 11/19/2016 0607   CREATININE 0.83 04/08/2016 0933   CALCIUM 8.8 (L) 11/19/2016 0607   PROT 6.6 11/17/2016 0418   ALBUMIN 3.3 (L) 11/17/2016 0418   AST 30 11/17/2016 0418   ALT 24 11/17/2016 0418   ALKPHOS 73 11/17/2016 0418   BILITOT 1.2 11/17/2016  0418   GFRNONAA >60 11/19/2016 0607   GFRNONAA 73 04/08/2016 0933   GFRAA >60 11/19/2016 0607   GFRAA 84 04/08/2016 0933   Lipase     Component Value Date/Time   LIPASE 23 11/13/2016 1204       Studies/Results: Dg Abd Portable 1v  Result Date: 11/18/2016 CLINICAL DATA:  Followup small bowel obstruction EXAM: PORTABLE ABDOMEN - 1 VIEW COMPARISON:  11/17/2016 FINDINGS: Contrast material administered previously is now only nearly completely in the colon periods small bowel dilatation remains although continues improvement from the prior exam. Nasogastric catheter is seen. IMPRESSION: Continued improvement in the degree of small bowel dilatation. Electronically Signed   By: Inez Catalina M.D.   On: 11/18/2016 10:01   Dg Abd Portable 1v-small Bowel Obstruction Protocol-initial, 8 Hr Delay  Result Date: 11/17/2016 CLINICAL DATA:  8 hour delay film following contrast administration EXAM: PORTABLE ABDOMEN - 1 VIEW COMPARISON:  11/17/2016 FINDINGS: Contrast material is noted scattered throughout the small bowel. Contrast material is also noted within the colon. Continued dilatation of the small bowel is seen. IMPRESSION: Contrast has reached the colon at 8 hours. Persistent small bowel dilatation is noted. Electronically Signed   By: Inez Catalina M.D.   On: 11/17/2016 21:18   Dg Addison Bailey G Tube Plc  W/fl W/rad  Result Date: 11/17/2016 CLINICAL DATA:  NG tube placement for SBO EXAM: NASO G TUBE PLACEMENT WITH FL AND WITH RAD CONTRAST:  None. FLUOROSCOPY TIME:  Fluoroscopy Time:  6 seconds Radiation Exposure Index (if provided by the fluoroscopic device): 0.80 mGy Number of Acquired Spot Images: 2 screen captures COMPARISON:  None. FINDINGS: NG tube placed via left nares into the stomach. Fluoroscopic screen capture taken to confirm intragastric placement. NG tube terminates in the distal gastric body. IMPRESSION: Successful NG tube placement, terminating in the distal gastric body. Electronically Signed    By: Julian Hy M.D.   On: 11/17/2016 12:40    Anti-infectives: Anti-infectives    None       Assessment/Plan Small bowel obstruction, likely secondary to adhesions - XR 5/28 showed contrast in colon and less small bowel dilation - passing flatus, BM on 5/27  HTN HLD CHF Hypothyroid Bradycardia  ID - none FEN - IVF, clear liquids VTE - SCDs, heparin  Plan - D/c NG tube and advance to clear liquids. Encourage more ambulation/mobilization today.    LOS: 3 days    Jerrye Beavers , [facility] Surgery 11/19/2016, 11:09 AM Pager: [phone] Consults: [phone] Mon-Fri 7:00 am-4:30 pm Sat-Sun 7:00 am-11:30 am

## 2016-11-19 NOTE — Progress Notes (Signed)
[facility]                                                                              Patient Demographics  Adrienne [name], is a 71 y.o. female, DOB - 1946/06/23, JXB:[id number]  Admit date - 11/16/2016   Admitting Physician Reubin Milan, MD  Outpatient Primary MD for the patient is Rennis Golden  Outpatient specialists:   LOS - 3  days    Chief Complaint  Patient presents with  . Abdominal Pain  . Emesis       Brief summary   71 yo female With a diastolic CHF, hypertension, hypothyroidism resented with abdominal pain. Recent hospitalization 05/23 at [facility] for small bowel obstruction, left the hospital against medical advice 5/26. presented to [facility] for second opinion. Abdominal pain persistent since 13/08, colicky in nature associated with nausea and vomiting. Abdominal exam showed diffuse tenderness with decreased bowel sounds. Abdominal x-ray showed worsening small bowel obstruction.Patient was admitted with persistent SBO.   Assessment & Plan    Principal Problem:   SBO (small bowel obstruction) (HCC) - Gen. surgery following, continue conservative management - Improving, abdominal x-ray showed mildly dilated small bowel loop in the right side of the abdomen representing focal ileus - Patient reporting BM on Sunday and passing flatus -Surgery recommending discontinuing NG tube and placing on clear liquid diet today, increase ambulation  Active Problems:   Hypertension - Continue as needed hydralazine IV with parameters -Restart amlodipine as patient is placed on clear liquid diet today    Dyslipidemia - Hold omega 3 fatty acids    Chronic diastolic  CHF (congestive heart failure) (HCC) - Currently stable, euvolemic, 2-D echo from 2015 with preserved LV systolic function, EF 65-78%    Hypothyroidism - Continue IV Synthroid - TSH 6.8    Bradycardia - Continue telemetry, not an AV blockers, Coreg on hold   Code Status: full  DVT  Prophylaxis:   heparin  Family Communication: Discussed in detail with the patient, all imaging results, lab results explained to the patient    Disposition Plan: Hopefully DC home tomorrow or by September 28, 2022 morning. Patient requested to be at the funeral of her brother who passed away. Remer Macho is on 28-Sep-2022.  Time Spent in minutes   25 minutes  Procedures:  Serial abdominal x-rays  Consultants:   Gen. surgery  Antimicrobials:      Medications  Scheduled Meds: . chlorhexidine  15 mL Mouth Rinse BID  . diatrizoate meglumine-sodium  90 mL Per NG tube Once  . heparin  5,000 Units Subcutaneous Q8H  . levothyroxine  40 mcg Intravenous Daily  . mouth rinse  15 mL Mouth Rinse q12n4p   Continuous Infusions: . dextrose 5 % and 0.9% NaCl 75 mL/hr at 11/19/16 0946  . famotidine (PEPCID) IV Stopped (11/19/16 1016)  . potassium chloride 10 mEq (11/19/16 1301)   PRN Meds:.hydrALAZINE, HYDROmorphone (DILAUDID) injection, ondansetron **OR** ondansetron (ZOFRAN) IV, promethazine   Antibiotics   Anti-infectives    None        Subjective:   Adrienne [name] was seen and examined today. Feeling overall improving, states had a  BM on Sunday, passing flatus. Tearful about her brother who passed away last week. Patient denies dizziness, chest pain, shortness of breath,  new weakness, numbess, tingling. No acute events overnight.    Objective:   Vitals:   11/18/16 2118 11/19/16 0523 11/19/16 0543 11/19/16 0613  BP: (!) 127/94 (!) 170/77 (!) 170/77 (!) 154/74  Pulse: 66 65  69  Resp: 18 18    Temp: 98.6 F (37 C) 98.9 F (37.2 C)    TempSrc: Oral Oral    SpO2: 100% 100%    Weight:      Height:        Intake/Output Summary (Last 24 hours) at 11/19/16 1406 Last data filed at 11/19/16 1012  Gross per 24 hour  Intake             1429 ml  Output             17 00 ml  Net             -271 ml     Wt Readings from Last 3 Encounters:  11/18/16 76.7 kg (169 lb)  11/15/16 76.8 kg  (169 lb 5 oz)  10/28/16 78.6 kg (173 lb 3.2 oz)     Exam  General: Alert and oriented x 3, NAD, NGT+  HEENT:    Neck: Supple, no JVD,   Cardiovascular: S1 S2Clear, RRR   Respiratory:CTA B   Gastrointestinal: Soft,NT, ND, NBS   Ext: no cyanosis clubbing or edema  Neuro:no new deficits   Skin: No rashes  Psych: Normal affect and demeanor, alert and oriented x3    Data Reviewed:  I have personally reviewed following labs and imaging studies  Micro Results No results found for this or any previous visit (from the past 240 hour(s)).  Radiology Reports Ct Abdomen Pelvis W Contrast  Result Date: 11/13/2016 CLINICAL DATA:  Acute generalized abdominal pain. EXAM: CT ABDOMEN AND PELVIS WITH CONTRAST TECHNIQUE: Multidetector CT imaging of the abdomen and pelvis was performed using the standard protocol following bolus administration of intravenous contrast. CONTRAST:  150mL ISOVUE-300 IOPAMIDOL (ISOVUE-300) INJECTION 61% COMPARISON:  CT scan of June 28, 2015. FINDINGS: Lower chest: No acute abnormality. Hepatobiliary: No focal liver abnormality is seen. Status post cholecystectomy. No biliary dilatation. Pancreas: Unremarkable. No pancreatic ductal dilatation or surrounding inflammatory changes. Spleen: Normal in size without focal abnormality. Adrenals/Urinary Tract: Adrenal glands are unremarkable. No hydronephrosis or renal obstruction is noted. 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 Urinary bladder is unremarkable. Stomach/Bowel: The appendix is not clearly identified, but no inflammation is seen in the right lower quadrant. No colonic dilatation is noted. Dilated small bowel loops are noted in the left lower quadrant and pelvis with fecalization noted. Transition zone is noted in the right side of the pelvis best seen on image number 69 of series 2. The small bowel beyond the transition zone is nondilated,  but demonstrates possible enhancing walls suggesting inflammation. Vascular/Lymphatic: Aortic atherosclerosis. No enlarged abdominal or pelvic lymph nodes. Reproductive: Status post hysterectomy. No adnexal masses. Other: No abdominal wall hernia or abnormality. No abdominopelvic ascites. Musculoskeletal: No acute or significant osseous findings. IMPRESSION: Dilated small bowel loops are noted in the left lower quadrant of the abdomen and pelvis with fecalization, secondary to transition zone seen in the right pelvis. The ileum beyond the transition zone is nondilated but did demonstrate possible wall enhancement, suggesting active inflammation. It is uncertain  if this partial obstruction may be due to a adhesion or inflammatory bowel disease such as Crohn's disease. Clinical correlation is recommended. Aortic atherosclerosis. Electronically Signed   By: Marijo Conception, M.D.   On: 11/13/2016 15:08   Dg Abd 2 Views  Result Date: 11/16/2016 CLINICAL DATA:  Small bowel obstruction. EXAM: ABDOMEN - 2 VIEW COMPARISON:  CT scan Nov 13, 2016 FINDINGS: The lung bases are normal. No free air or portal venous gas. Air-filled dilated loops of small bowel are worsened compared to the scout film from Nov 13, 2016. There is a paucity of colonic gas. Degenerative changes in the hips. IMPRESSION: Worsening small bowel obstruction. Electronically Signed   By: Dorise Bullion III M.D   On: 11/16/2016 09:18   Dg Abd Portable 1v  Result Date: 11/19/2016 CLINICAL DATA:  Small bowel obstruction. EXAM: PORTABLE ABDOMEN - 1 VIEW COMPARISON:  Radiograph of Nov 18, 2016. FINDINGS: No colonic dilatation is noted. Mildly dilated small bowel loop is seen in left side of abdomen. Residual contrast is seen in the colon. Nasogastric tube has been removed. Surgical sutures are noted in the right upper quadrant. Phlebolith is seen in the pelvis. IMPRESSION: Mildly dilated small bowel loop is seen in left side of abdomen which may represent  focal ileus. Electronically Signed   By: Marijo Conception, M.D.   On: 11/19/2016 12:17   Dg Abd Portable 1v  Result Date: 11/18/2016 CLINICAL DATA:  Followup small bowel obstruction EXAM: PORTABLE ABDOMEN - 1 VIEW COMPARISON:  11/17/2016 FINDINGS: Contrast material administered previously is now only nearly completely in the colon periods small bowel dilatation remains although continues improvement from the prior exam. Nasogastric catheter is seen. IMPRESSION: Continued improvement in the degree of small bowel dilatation. Electronically Signed   By: Inez Catalina M.D.   On: 11/18/2016 10:01   Dg Abd Portable 1v-small Bowel Obstruction Protocol-initial, 8 Hr Delay  Result Date: 11/17/2016 CLINICAL DATA:  8 hour delay film following contrast administration EXAM: PORTABLE ABDOMEN - 1 VIEW COMPARISON:  11/17/2016 FINDINGS: Contrast material is noted scattered throughout the small bowel. Contrast material is also noted within the colon. Continued dilatation of the small bowel is seen. IMPRESSION: Contrast has reached the colon at 8 hours. Persistent small bowel dilatation is noted. Electronically Signed   By: Inez Catalina M.D.   On: 11/17/2016 21:18   Dg Addison Bailey G Tube Plc W/fl W/rad  Result Date: 11/17/2016 CLINICAL DATA:  NG tube placement for SBO EXAM: NASO G TUBE PLACEMENT WITH FL AND WITH RAD CONTRAST:  None. FLUOROSCOPY TIME:  Fluoroscopy Time:  6 seconds Radiation Exposure Index (if provided by the fluoroscopic device): 0.80 mGy Number of Acquired Spot Images: 2 screen captures COMPARISON:  None. FINDINGS: NG tube placed via left nares into the stomach. Fluoroscopic screen capture taken to confirm intragastric placement. NG tube terminates in the distal gastric body. IMPRESSION: Successful NG tube placement, terminating in the distal gastric body. Electronically Signed   By: Julian Hy M.D.   On: 11/17/2016 12:40    Lab Data:  CBC:  Recent Labs Lab 11/13/16 1225  11/15/16 1517  11/16/16 0603 11/17/16 0418 11/18/16 0508 11/19/16 0607  WBC 11.3*  < > 10.7* 9.7 10.7* 9.2 8.9  NEUTROABS 8.7*  --   --   --  7.8* 5.3  --   HGB 13.1  < > 13.0 13.0 12.7 12.4 12.9  HCT 39.1  < > 38.7 39.2 38.7 37.3 38.6  MCV 91.6  < >  91.7 91.8 91.3 91.6 91.3  PLT 307  < > 314 325 310 289 301  < > = values in this interval not displayed. Basic Metabolic Panel:  Recent Labs Lab 11/14/16 0602 11/15/16 3734 11/16/16 0603 11/17/16 0418 11/18/16 0508 11/19/16 0607  NA 139 137 137 136 136 138  K 3.5 3.6 3.6 3.6 3.2* 3.0*  CL 106 102 100* 101 102 104  CO2 26 25 26 25 22 23   GLUCOSE 121* 114* 108* 103* 69 103*  BUN 8 8 8 8 8  5*  CREATININE 0.69 0.72 0.69 0.71 0.85 0.71  CALCIUM 8.6* 8.8* 8.9 8.8* 8.6* 8.8*  MG 1.8 1.8 1.8 1.6*  --   --   PHOS  --   --   --  3.1  --   --    GFR: Estimated Creatinine Clearance: 65.1 mL/min (by C-G formula based on SCr of 0.71 mg/dL). Liver Function Tests:  Recent Labs Lab 11/13/16 1204 11/17/16 0418  AST 23 30  ALT 19 24  ALKPHOS 71 73  BILITOT 0.7 1.2  PROT 7.7 6.6  ALBUMIN 3.9 3.3*    Recent Labs Lab 11/13/16 1204  LIPASE 23   No results for input(s): AMMONIA in the last 168 hours. Coagulation Profile: No results for input(s): INR, PROTIME in the last 168 hours. Cardiac Enzymes: No results for input(s): CKTOTAL, CKMB, CKMBINDEX, TROPONINI in the last 168 hours. BNP (last 3 results) No results for input(s): PROBNP in the last 8760 hours. HbA1C: No results for input(s): HGBA1C in the last 72 hours. CBG:  Recent Labs Lab 11/18/16 1213 11/18/16 1731 11/19/16 0003 11/19/16 0623 11/19/16 1134  GLUCAP 75 80 99 95 83   Lipid Profile: No results for input(s): CHOL, HDL, LDLCALC, TRIG, CHOLHDL, LDLDIRECT in the last 72 hours. Thyroid Function Tests:  Recent Labs  11/17/16 0418  TSH 6.838*   Anemia Panel: No results for input(s): VITAMINB12, FOLATE, FERRITIN, TIBC, IRON, RETICCTPCT in the last 72 hours. Urine  analysis:    Component Value Date/Time   COLORURINE YELLOW 11/13/2016 [place] 11/13/2016 1156   LABSPEC 1.014 11/13/2016 1156   PHURINE 6.0 11/13/2016 1156   GLUCOSEU NEGATIVE 11/13/2016 [place] 11/13/2016 1156   BILIRUBINUR NEGATIVE 11/13/2016 1156   KETONESUR NEGATIVE 11/13/2016 1156   PROTEINUR NEGATIVE 11/13/2016 1156   NITRITE NEGATIVE 11/13/2016 [place] 11/13/2016 1156     Datrell Dunton M.D. [facility] 11/19/2016, 2:06 PM  Pager: [phone] Between 7am to 7pm - call Pager - [phone]  After 7pm go to [email or web address] - password [email or web address]  Call night coverage person covering after 7pm

## 2016-11-20 LAB — BASIC METABOLIC PANEL
Anion gap: 8 (ref 5–15)
CALCIUM: 9 mg/dL (ref 8.9–10.3)
CO2: 22 mmol/L (ref 22–32)
Chloride: 106 mmol/L (ref 101–111)
Creatinine, Ser: 0.76 mg/dL (ref 0.44–1.00)
GFR calc Af Amer: >60 mL/min (ref >60)
Glucose, Bld: 127 mg/dL — ABNORMAL HIGH (ref 65–99)
POTASSIUM: 3.3 mmol/L — AB (ref 3.5–5.1)
SODIUM: 136 mmol/L (ref 135–145)

## 2016-11-20 LAB — CBC
HEMATOCRIT: 37.7 % (ref 36.0–46.0)
Hemoglobin: 12.6 g/dL (ref 12.0–15.0)
MCH: 30.2 pg (ref 26.0–34.0)
MCHC: 33.4 g/dL (ref 30.0–36.0)
MCV: 90.4 fL (ref 78.0–100.0)
PLATELETS: 309 10*3/uL (ref 150–400)
RBC: 4.17 MIL/uL (ref 3.87–5.11)
RDW: 14.1 % (ref 11.5–15.5)
WBC: 9.2 10*3/uL (ref 4.0–10.5)

## 2016-11-20 LAB — GLUCOSE, CAPILLARY
GLUCOSE-CAPILLARY: 113 mg/dL — AB (ref 65–99)
Glucose-Capillary: 122 mg/dL — ABNORMAL HIGH (ref 65–99)
Glucose-Capillary: 97 mg/dL (ref 65–99)

## 2016-11-20 LAB — MAGNESIUM: MAGNESIUM: 1.6 mg/dL — AB (ref 1.7–2.4)

## 2016-11-20 MED ORDER — POTASSIUM CHLORIDE 10 MEQ/100ML IV SOLN
10.0000 meq | INTRAVENOUS | Status: AC
  Administered 2016-11-20 (×2): 10 meq via INTRAVENOUS
  Filled 2016-11-20 (×2): qty 100

## 2016-11-20 NOTE — Care Management Note (Signed)
Case Management Note  Patient Details  Name: ZAINAB CRUMRINE MRN: [id number] Date of Birth: July 07, 1945  Subjective/Objective:                    Action/Plan:  No needs identified at present, will continue to follow. Expected Discharge Date:                  Expected Discharge Plan:  Home/Self Care  In-House Referral:     Discharge planning Services     Post Acute Care Choice:    Choice offered to:     DME Arranged:    DME Agency:     HH Arranged:    HH Agency:     Status of Service:  In process, will continue to follow  If discussed at Long Length of Stay Meetings, dates discussed:    Additional Comments:  Marilu Favre, RN 11/20/2016, 10:58 AM

## 2016-11-20 NOTE — Progress Notes (Signed)
Patient ID: Adrienne [name], female   DOB: [birth year], 71 y.o.   MRN: [id number]  [facility] Surgery Progress Note     Subjective: CC- SBO Sitting in chair this morning. States that she feels much better. Abdomen a little sore but not painful. Denies n/v. Tolerating clear liquids. She has had a couple loose BM's and is passing flatus. Ambulating without difficulty.  Objective: Vital signs in last 24 hours: Temp:  [98.2 F (36.8 C)-98.5 F (36.9 C)] 98.4 F (36.9 C) (05/30 0507) Pulse Rate:  [51-57] 57 (05/30 0507) Resp:  [18] 18 (05/30 0507) BP: (129-145)/(66-74) 129/74 (05/30 0507) SpO2:  [99 %-100 %] 100 % (05/30 0507) Last BM Date: 11/19/16  Intake/Output from previous day: 05/29 0701 - 05/30 0700 In: 3170 [P.O.:1360; I.V.:1710; IV Piggyback:100] Out: 850 [Urine:850] Intake/Output this shift: Total I/O In: 480 [P.O.:480] Out: 300 [Urine:300]  PE: Gen:  Alert, NAD, pleasant HEENT - sclerae clear, mucous membranes moist, dentition fair Card:  RRR, no M/G/R heard Pulm:  CTAB, no W/R/R, effort normal Abd: Soft, NT/ND, +BS, no HSM, no hernia Ext:  No erythema, edema, or tenderness BUE/BLE   Lab Results:   Recent Labs  11/19/16 0607 11/20/16 0444  WBC 8.9 9.2  HGB 12.9 12.6  HCT 38.6 37.7  PLT 301 309   BMET  Recent Labs  11/19/16 0607 11/20/16 0444  NA 138 136  K 3.0* 3.3*  CL 104 106  CO2 23 22  GLUCOSE 103* 127*  BUN 5* <5*  CREATININE 0.71 0.76  CALCIUM 8.8* 9.0   PT/INR No results for input(s): LABPROT, INR in the last 72 hours. CMP     Component Value Date/Time   NA 136 11/20/2016 0444   K 3.3 (L) 11/20/2016 0444   CL 106 11/20/2016 0444   CO2 22 11/20/2016 0444   GLUCOSE 127 (H) 11/20/2016 0444   BUN <5 (L) 11/20/2016 0444   CREATININE 0.76 11/20/2016 0444   CREATININE 0.83 04/08/2016 0933   CALCIUM 9.0 11/20/2016 0444   PROT 6.6 11/17/2016 0418   ALBUMIN 3.3 (L) 11/17/2016 0418   AST 30 11/17/2016 0418   ALT 24 11/17/2016  0418   ALKPHOS 73 11/17/2016 0418   BILITOT 1.2 11/17/2016 0418   GFRNONAA >60 11/20/2016 0444   GFRNONAA 73 04/08/2016 0933   GFRAA >60 11/20/2016 0444   GFRAA 84 04/08/2016 0933   Lipase     Component Value Date/Time   LIPASE 23 11/13/2016 1204       Studies/Results: Dg Abd Portable 1v  Result Date: 11/19/2016 CLINICAL DATA:  Small bowel obstruction. EXAM: PORTABLE ABDOMEN - 1 VIEW COMPARISON:  Radiograph of Nov 18, 2016. FINDINGS: No colonic dilatation is noted. Mildly dilated small bowel loop is seen in left side of abdomen. Residual contrast is seen in the colon. Nasogastric tube has been removed. Surgical sutures are noted in the right upper quadrant. Phlebolith is seen in the pelvis. IMPRESSION: Mildly dilated small bowel loop is seen in left side of abdomen which may represent focal ileus. Electronically Signed   By: Marijo Conception, M.D.   On: 11/19/2016 12:17    Anti-infectives: Anti-infectives    None       Assessment/Plan Small bowel obstruction, likely secondary to adhesions - XR 5/28 showed contrast in colon and less small bowel dilation - tolerating clears and having bowel function  HTN HLD CHF Hypothyroid Bradycardia  ID - none FEN - IVF, full liquids VTE - SCDs, heparin  Plan -  Patient asking when she will be discharged; funeral for her brother is tomorrow and she hopes to attend. SBO seems to be improving. Will give full liquids today. If she tolerates this I think she could be discharged this afternoon, continue fulls x24 hours, and then slowly advance to soft diet. Advised that we would prefer to confirm she can tolerate soft diet prior to discharge. Discussed strict return precautions including increased abdominal pain/distension, nausea, vomiting, or decreased bowel function.   LOS: 4 days    Jerrye Beavers , [facility] Surgery 11/20/2016, 11:23 AM Pager: [phone] Consults: [phone] Mon-Fri 7:00 am-4:30 pm Sat-Sun  7:00 am-11:30 am

## 2016-11-20 NOTE — Progress Notes (Signed)
Discharge instructions reviewed with pt.  Pt verbalized understanding and had no questions.  Pt discharged in stable condition via wheelchair with family.  Adrienne [name] [place]

## 2016-11-20 NOTE — Care Management Important Message (Signed)
Important Message  Patient Details  Name: Adrienne [name] MRN: [id number] Date of Birth: 1945/07/29   Medicare Important Message Given:  Yes    Orbie Pyo 11/20/2016, 11:32 AM

## 2016-11-20 NOTE — Discharge Summary (Signed)
Physician Discharge Summary  Adrienne [name] ZHY:[id number] DOB: 1946/04/23 DOA: 11/16/2016  PCP: Orlena Sheldon, PA-C  Admit date: 11/16/2016 Discharge date: 11/20/2016  Admitted From: Home Disposition: Home   Recommendations for Outpatient Follow-up:  1. Follow up with PCP as needed 2. TSH 6.838, recommend recheck in 6 weeks.   Home Health: None, declined [facility] Equipment/Devices: None Discharge Condition: Stable CODE STATUS: Full Diet recommendation: Liquids for a few days, then advance as tolerated slowly.  Brief/Interim Summary: 71 yo female With a diastolic CHF, hypertension, hypothyroidism resented with abdominal pain. Recent hospitalization 05/23 at [facility] for small bowel obstruction, left the hospital against medical advice 5/26. presented to [facility] for second opinion. Abdominal pain persistent since 29/52, colicky in nature associated with nausea and vomiting. Abdominal exam showed diffuse tenderness with decreased bowel sounds. Abdominal x-ray showed worsening small bowel obstruction. Patient was admitted with persistent SBO that was treated conservatively, showing signs of resolution 5/28. She has tolerated full liquids, had normal BM, and is discharged in stable condition.  Discharge Diagnoses:  Principal Problem:   SBO (small bowel obstruction) (HCC) Active Problems:   Hypertension   Dyslipidemia   Chronic systolic CHF (congestive heart failure) (HCC)   Hypothyroidism   Bradycardia   Impaired glucose tolerance  SBO: Presumably related to prior surgeries. Resolved with conservative measures. General surgery has cleared her for discharge.     Hypertension -Restart home medications    Dyslipidemia - Restart omega 3 fatty acids    Chronic diastolic  CHF (congestive heart failure) (HCC) - Currently stable, euvolemic, 2-D echo from 2015 with preserved LV systolic function, EF 84-13%    Hypothyroidism - Continue synthroid - TSH 6.838, recommend recheck in 6 weeks.    Sinus bradycardia: Held coreg while inpatient. Pt reports that she will restart this until seen by her PCP.  Discharge Instructions Discharge Instructions    Discharge instructions    Complete by:  As directed    Keep taking a full liquid diet for a few days, then return to soft foods as tolerated, then regular diet. If your symptoms return, seek medical attention right away.     Allergies as of 11/20/2016      Reactions   Lisinopril Swelling   Angioedema   Atorvastatin Other (See Comments)   Muscle aches       Medication List    TAKE these medications   ALEVE 220 MG tablet Generic drug:  naproxen sodium Take 220 mg by mouth 2 (two) times daily as needed (pain).   amLODipine 10 MG tablet Commonly known as:  NORVASC TAKE ONE TABLET BY MOUTH ONCE DAILY   aspirin EC 81 MG tablet Take 81 mg by mouth daily.   carvedilol 3.125 MG tablet Commonly known as:  COREG Take 3.125 mg by mouth daily.   FISH OIL PO Take 1 capsule by mouth daily.   levothyroxine 75 MCG tablet Commonly known as:  SYNTHROID, LEVOTHROID TAKE ONE TABLET BY MOUTH ONCE DAILY   multivitamin with minerals tablet Take 1 tablet by mouth daily.   OVER THE COUNTER MEDICATION Place 1 drop into both eyes daily as needed (dry eyes). Over the counter lubricating eye drop      Follow-up Information    Dena Billet B, PA-C Follow up.   Specialty:  Physician Assistant Contact information: [address] [place] [address] [phone]          Allergies  Allergen Reactions  . Lisinopril Swelling  Angioedema  . Atorvastatin Other (See Comments)    Muscle aches      Consultations:  General surgery  Procedures/Studies: Ct Abdomen Pelvis W Contrast  Result Date: 11/13/2016 CLINICAL DATA:  Acute generalized abdominal pain. EXAM: CT ABDOMEN AND PELVIS WITH CONTRAST TECHNIQUE: Multidetector CT imaging of the abdomen and pelvis was performed using the standard protocol following bolus  administration of intravenous contrast. CONTRAST:  17mL ISOVUE-300 IOPAMIDOL (ISOVUE-300) INJECTION 61% COMPARISON:  CT scan of June 28, 2015. FINDINGS: Lower chest: No acute abnormality. Hepatobiliary: No focal liver abnormality is seen. Status post cholecystectomy. No biliary dilatation. Pancreas: Unremarkable. No pancreatic ductal dilatation or surrounding inflammatory changes. Spleen: Normal in size without focal abnormality. Adrenals/Urinary Tract: Adrenal glands are unremarkable. No hydronephrosis or renal obstruction is noted. 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 Urinary bladder is unremarkable. Stomach/Bowel: The appendix is not clearly identified, but no inflammation is seen in the right lower quadrant. No colonic dilatation is noted. Dilated small bowel loops are noted in the left lower quadrant and pelvis with fecalization noted. Transition zone is noted in the right side of the pelvis best seen on image number 69 of series 2. The small bowel beyond the transition zone is nondilated, but demonstrates possible enhancing walls suggesting inflammation. Vascular/Lymphatic: Aortic atherosclerosis. No enlarged abdominal or pelvic lymph nodes. Reproductive: Status post hysterectomy. No adnexal masses. Other: No abdominal wall hernia or abnormality. No abdominopelvic ascites. Musculoskeletal: No acute or significant osseous findings. IMPRESSION: Dilated small bowel loops are noted in the left lower quadrant of the abdomen and pelvis with fecalization, secondary to transition zone seen in the right pelvis. The ileum beyond the transition zone is nondilated but did demonstrate possible wall enhancement, suggesting active inflammation. It is uncertain if this partial obstruction may be due to a adhesion or inflammatory bowel disease such as Crohn's disease. Clinical correlation is recommended. Aortic atherosclerosis. Electronically  Signed   By: Marijo Conception, M.D.   On: 11/13/2016 15:08   Dg Abd 2 Views  Result Date: 11/16/2016 CLINICAL DATA:  Small bowel obstruction. EXAM: ABDOMEN - 2 VIEW COMPARISON:  CT scan Nov 13, 2016 FINDINGS: The lung bases are normal. No free air or portal venous gas. Air-filled dilated loops of small bowel are worsened compared to the scout film from Nov 13, 2016. There is a paucity of colonic gas. Degenerative changes in the hips. IMPRESSION: Worsening small bowel obstruction. Electronically Signed   By: Dorise Bullion III M.D   On: 11/16/2016 09:18   Dg Abd Portable 1v  Result Date: 11/19/2016 CLINICAL DATA:  Small bowel obstruction. EXAM: PORTABLE ABDOMEN - 1 VIEW COMPARISON:  Radiograph of Nov 18, 2016. FINDINGS: No colonic dilatation is noted. Mildly dilated small bowel loop is seen in left side of abdomen. Residual contrast is seen in the colon. Nasogastric tube has been removed. Surgical sutures are noted in the right upper quadrant. Phlebolith is seen in the pelvis. IMPRESSION: Mildly dilated small bowel loop is seen in left side of abdomen which may represent focal ileus. Electronically Signed   By: Marijo Conception, M.D.   On: 11/19/2016 12:17   Dg Abd Portable 1v  Result Date: 11/18/2016 CLINICAL DATA:  Followup small bowel obstruction EXAM: PORTABLE ABDOMEN - 1 VIEW COMPARISON:  11/17/2016 FINDINGS: Contrast material administered previously is now only nearly completely in the colon periods small bowel dilatation remains although continues improvement from the prior  exam. Nasogastric catheter is seen. IMPRESSION: Continued improvement in the degree of small bowel dilatation. Electronically Signed   By: Inez Catalina M.D.   On: 11/18/2016 10:01   Dg Abd Portable 1v-small Bowel Obstruction Protocol-initial, 8 Hr Delay  Result Date: 11/17/2016 CLINICAL DATA:  8 hour delay film following contrast administration EXAM: PORTABLE ABDOMEN - 1 VIEW COMPARISON:  11/17/2016 FINDINGS: Contrast  material is noted scattered throughout the small bowel. Contrast material is also noted within the colon. Continued dilatation of the small bowel is seen. IMPRESSION: Contrast has reached the colon at 8 hours. Persistent small bowel dilatation is noted. Electronically Signed   By: Inez Catalina M.D.   On: 11/17/2016 21:18   Dg Addison Bailey G Tube Plc W/fl W/rad  Result Date: 11/17/2016 CLINICAL DATA:  NG tube placement for SBO EXAM: NASO G TUBE PLACEMENT WITH FL AND WITH RAD CONTRAST:  None. FLUOROSCOPY TIME:  Fluoroscopy Time:  6 seconds Radiation Exposure Index (if provided by the fluoroscopic device): 0.80 mGy Number of Acquired Spot Images: 2 screen captures COMPARISON:  None. FINDINGS: NG tube placed via left nares into the stomach. Fluoroscopic screen capture taken to confirm intragastric placement. NG tube terminates in the distal gastric body. IMPRESSION: Successful NG tube placement, terminating in the distal gastric body. Electronically Signed   By: Julian Hy M.D.   On: 11/17/2016 12:40   Subjective: Tolerating full liquids without abdominal pain. Passing stool.   Discharge Exam: BP 125/68 (BP Location: Left Arm)   Pulse (!) 53   Temp 98.7 F (37.1 C) (Oral)   Resp 18   Ht 5\' 3"  (1.6 m)   Wt 76.7 kg (169 lb)   SpO2 100%   BMI 29.94 kg/m   General: Pt is alert, awake, not in acute distress Cardiovascular: RRR, S1/S2 +, no rubs, no gallops Respiratory: CTA bilaterally, no wheezing, no rhonchi Abdominal: Soft, NT, ND, bowel sounds + Extremities: No edema, no cyanosis  Labs: Basic Metabolic Panel:  Recent Labs Lab 11/14/16 0602 11/15/16 3299 11/16/16 0603 11/17/16 0418 11/18/16 0508 11/19/16 0607 11/20/16 0444  NA 139 137 137 136 136 138 136  K 3.5 3.6 3.6 3.6 3.2* 3.0* 3.3*  CL 106 102 100* 101 102 104 106  CO2 26 25 26 25 22 23 22   GLUCOSE 121* 114* 108* 103* 69 103* 127*  BUN 8 8 8 8 8  5* <5*  CREATININE 0.69 0.72 0.69 0.71 0.85 0.71 0.76  CALCIUM 8.6* 8.8* 8.9  8.8* 8.6* 8.8* 9.0  MG 1.8 1.8 1.8 1.6*  --   --  1.6*  PHOS  --   --   --  3.1  --   --   --    Liver Function Tests:  Recent Labs Lab 11/17/16 0418  AST 30  ALT 24  ALKPHOS 73  BILITOT 1.2  PROT 6.6  ALBUMIN 3.3*   CBC:  Recent Labs Lab 11/16/16 0603 11/17/16 0418 11/18/16 0508 11/19/16 0607 11/20/16 0444  WBC 9.7 10.7* 9.2 8.9 9.2  NEUTROABS  --  7.8* 5.3  --   --   HGB 13.0 12.7 12.4 12.9 12.6  HCT 39.2 38.7 37.3 38.6 37.7  MCV 91.8 91.3 91.6 91.3 90.4  PLT 325 310 289 301 309   CBG:  Recent Labs Lab 11/19/16 1134 11/19/16 1832 11/20/16 0006 11/20/16 0555 11/20/16 1230  GLUCAP 83 122* 113* 122* 97   Urinalysis    Component Value Date/Time   COLORURINE YELLOW 11/13/2016 1156   APPEARANCEUR  CLEAR 11/13/2016 1156   LABSPEC 1.014 11/13/2016 [place] 6.0 11/13/2016 [place] 11/13/2016 [place] 11/13/2016 [place] 11/13/2016 [place] 11/13/2016 [place] 11/13/2016 1156   NITRITE NEGATIVE 11/13/2016 [place] 11/13/2016 1156   Time coordinating discharge: Approximately 40 minutes  Vance Gather, MD  [facility] 11/20/2016, 5:35 PM Pager [phone]

## 2016-11-20 NOTE — Consult Note (Signed)
   [facility] CM Inpatient Consult   11/20/2016  CAMIL HAUSMANN 06/19/1946 [id number]     Patient screened for [place] Management services. Went to bedside to offer and explain [facility] Care Management program with patient. Mrs. Ricci pleasantly declines [facility] services.  She denies having transition of care, care coordination, disease management, disease monitoring, transportation, community resource, or pharmacy needs at this time.   Accepted [place] Management brochure with contact information and 24-hr nurse line magnet to call if needed.  Will make inpatient RNCM aware that patient declined [place] Management program services.  Marthenia Rolling, MSN-Ed, RN,BSN [facility] Liaison [phone]

## 2016-12-04 ENCOUNTER — Encounter: Payer: Self-pay | Admitting: Physician Assistant

## 2016-12-04 ENCOUNTER — Ambulatory Visit (INDEPENDENT_AMBULATORY_CARE_PROVIDER_SITE_OTHER): Payer: PPO | Admitting: Physician Assistant

## 2016-12-04 ENCOUNTER — Inpatient Hospital Stay: Payer: Self-pay | Admitting: Physician Assistant

## 2016-12-04 VITALS — BP 160/94 | HR 61 | Temp 97.9°F | Resp 16 | Wt 167.0 lb

## 2016-12-04 DIAGNOSIS — Z09 Encounter for follow-up examination after completed treatment for conditions other than malignant neoplasm: Secondary | ICD-10-CM

## 2016-12-04 DIAGNOSIS — E039 Hypothyroidism, unspecified: Secondary | ICD-10-CM

## 2016-12-04 DIAGNOSIS — K56609 Unspecified intestinal obstruction, unspecified as to partial versus complete obstruction: Secondary | ICD-10-CM | POA: Diagnosis not present

## 2016-12-04 NOTE — Progress Notes (Signed)
Patient ID: Adrienne [name] MRN: [id number], DOB: 08-Oct-1945, 71 y.o. Date of Encounter: 12/04/2016, 11:14 AM    Chief Complaint:  Chief Complaint  Patient presents with  . Hospitalization Follow-up     HPI: 71 y.o. year old female presents for f/u After recent hospitalization.  Reviewed her hospital discharge summary. Hospitalized 11/16/16-11/20/16. Was diagnosed with small bowel obstruction that was treated conservatively showing signs of resolution 5/28. At that point she was tolerating  Liquids, had normal bowel movement, and was discharged in stable condition. SBO was presumably related to prior surgeries. Resolved with conservative measures. General surgery cleared her for discharge. At discharge she was to follow-up with PCP as needed. They did recommend her to recheck TSH in 6 weeks as TSH level in the hospital was 6.838.  Patient states that because of her abdominal pain/SBO symptoms she had not taken her thyroid medication for several days at the time of that blood draw.  Today she reports that she since discharge home she did have some abdominal pain and she "called the doctor and they told her to use over-the-counter medication for mild pain" and discussed indications for follow-up. She states that since then she thinks that the pain is gradually easing and resolving. States that she had no pain yesterday and none today. States that she has been sticking with a "soft diet and has been scared to eat anything real heavy yet". States that she is having normal bowel movements. No concerns to address today.     Home Meds:   Outpatient Medications Prior to Visit  Medication Sig Dispense Refill  . amLODipine (NORVASC) 10 MG tablet TAKE ONE TABLET BY MOUTH ONCE DAILY 90 tablet 2  . aspirin EC 81 MG tablet Take 81 mg by mouth daily.    . carvedilol (COREG) 3.125 MG tablet Take 3.125 mg by mouth daily.    Marland Kitchen levothyroxine (SYNTHROID, LEVOTHROID) 75 MCG tablet TAKE ONE TABLET BY MOUTH  ONCE DAILY 90 tablet 0  . Multiple Vitamins-Minerals (MULTIVITAMIN WITH MINERALS) tablet Take 1 tablet by mouth daily.    . naproxen sodium (ALEVE) 220 MG tablet Take 220 mg by mouth daily as needed (pain).     . Omega-3 Fatty Acids (FISH OIL PO) Take 1 capsule by mouth daily.     Marland Kitchen OVER THE COUNTER MEDICATION Place 1 drop into both eyes daily as needed (dry eyes). Over the counter lubricating eye drop     No facility-administered medications prior to visit.     Allergies:  Allergies  Allergen Reactions  . Lisinopril Swelling    Angioedema  . Atorvastatin Other (See Comments)    Muscle aches        Review of Systems: See HPI for pertinent ROS. All other ROS negative.    Physical Exam: Blood pressure (!) 160/94, pulse 61, temperature 97.9 F (36.6 C), temperature source Oral, resp. rate 16, weight 167 lb (75.8 kg), SpO2 96 %., Body mass index is 29.58 kg/m. General: WNWD AAF.  Appears in no acute distress. Neck: Supple. No thyromegaly. No lymphadenopathy. Lungs: Clear bilaterally to auscultation without wheezes, rales, or rhonchi. Breathing is unlabored. Heart: Regular rhythm. No murmurs, rubs, or gallops. Abdomen: Soft, non-tender, non-distended with normoactive bowel sounds. No hepatomegaly. No rebound/guarding. No obvious abdominal masses. There is no area of tenderness with palpation. Msk:  Strength and tone normal for age. Extremities/Skin: Warm and dry. Neuro: Alert and oriented X 3. Moves all extremities spontaneously. Gait is normal. CNII-XII grossly in  tact. Psych:  Responds to questions appropriately with a normal affect.     ASSESSMENT AND PLAN:  71 y.o. year old female with  1. Hospital discharge follow-up She is stable since hospital discharge.  2. SBO (small bowel obstruction) ([place]) This is continuing to improve/resolve. Discussed indications for follow-up.  3. Hypothyroidism, unspecified type Because of her abdominal pain and symptoms, she skipped several  doses of thyroid medication at the time of recent TSH draw. She will resume taking thyroid medication daily and then recheck TSH in about 6 weeks. - TSH; Future  She will return to her usual routine office visit schedule of 6 months unless she needs to follow-up sooner then she will.  Signed, [address] [place], [place], [facility] 12/04/2016 11:14 AM

## 2016-12-10 ENCOUNTER — Other Ambulatory Visit: Payer: Self-pay | Admitting: Physician Assistant

## 2016-12-10 DIAGNOSIS — Z1231 Encounter for screening mammogram for malignant neoplasm of breast: Secondary | ICD-10-CM

## 2016-12-27 ENCOUNTER — Ambulatory Visit
Admission: RE | Admit: 2016-12-27 | Discharge: 2016-12-27 | Disposition: A | Discharge status: Home / Self Care | Payer: PPO | Source: Ambulatory Visit | Attending: Physician Assistant | Admitting: Physician Assistant

## 2016-12-27 DIAGNOSIS — Z1231 Encounter for screening mammogram for malignant neoplasm of breast: Secondary | ICD-10-CM | POA: Diagnosis not present

## 2016-12-27 LAB — HM MAMMOGRAPHY

## 2016-12-31 DIAGNOSIS — M25562 Pain in left knee: Secondary | ICD-10-CM | POA: Diagnosis not present

## 2016-12-31 DIAGNOSIS — M25561 Pain in right knee: Secondary | ICD-10-CM | POA: Diagnosis not present

## 2016-12-31 DIAGNOSIS — M25511 Pain in right shoulder: Secondary | ICD-10-CM | POA: Diagnosis not present

## 2017-01-02 ENCOUNTER — Other Ambulatory Visit: Payer: Self-pay | Admitting: Physician Assistant

## 2017-01-06 DIAGNOSIS — M25511 Pain in right shoulder: Secondary | ICD-10-CM | POA: Diagnosis not present

## 2017-01-07 ENCOUNTER — Other Ambulatory Visit: Payer: Self-pay | Admitting: Physician Assistant

## 2017-01-07 NOTE — Telephone Encounter (Signed)
Refill appropriate 

## 2017-01-09 DIAGNOSIS — M25511 Pain in right shoulder: Secondary | ICD-10-CM | POA: Diagnosis not present

## 2017-01-10 ENCOUNTER — Telehealth: Payer: Self-pay | Admitting: Physician Assistant

## 2017-01-10 NOTE — Telephone Encounter (Addendum)
[phone] Patient calling to see if she can get a handicap placard if possible, he knees are full of arthritis per her ortho , wanted to see if dr pickard would do this since mb dixon is on vacation

## 2017-01-10 NOTE — Telephone Encounter (Signed)
Can be addressed by pcp in 1 week.  Not urgent

## 2017-01-20 NOTE — Telephone Encounter (Signed)
Yes, I will complete handicap parking form.

## 2017-01-21 NOTE — Telephone Encounter (Signed)
Form completed. Will give to [facility] tomorrow.

## 2017-01-22 ENCOUNTER — Other Ambulatory Visit: Payer: PPO

## 2017-01-22 DIAGNOSIS — E039 Hypothyroidism, unspecified: Secondary | ICD-10-CM | POA: Diagnosis not present

## 2017-01-23 LAB — TSH: TSH: 1.88 mIU/L

## 2017-01-24 NOTE — Telephone Encounter (Signed)
Patient aware form has been completed and can be picked up at front desk at her convenience

## 2017-01-29 ENCOUNTER — Telehealth: Payer: Self-pay

## 2017-01-29 NOTE — Telephone Encounter (Signed)
Patient is due to have right shoulder scope and rotator cuff repair and need to have surgery clearance. I called patient to schedule the appointment with our office as well as with Dr Acie Fredrickson office for cardiology clearance.patient informed me that she has decided to hold off on the surgery for now.

## 2017-02-20 ENCOUNTER — Telehealth: Payer: Self-pay

## 2017-02-20 NOTE — Telephone Encounter (Signed)
Received fax from [facility] office for surgery clearance. I spoke with patient and she stated she was going to hold off on the surgery  for now. Will fax this info over to [facility] office

## 2017-04-22 ENCOUNTER — Encounter: Payer: Self-pay | Admitting: Family Medicine

## 2017-04-22 ENCOUNTER — Ambulatory Visit (INDEPENDENT_AMBULATORY_CARE_PROVIDER_SITE_OTHER): Payer: PPO | Admitting: Family Medicine

## 2017-04-22 VITALS — BP 160/84 | HR 72 | Temp 98.0°F | Resp 16 | Ht 63.0 in | Wt 186.0 lb

## 2017-04-22 DIAGNOSIS — M25561 Pain in right knee: Secondary | ICD-10-CM | POA: Diagnosis not present

## 2017-04-22 DIAGNOSIS — I10 Essential (primary) hypertension: Secondary | ICD-10-CM | POA: Diagnosis not present

## 2017-04-22 DIAGNOSIS — G8929 Other chronic pain: Secondary | ICD-10-CM

## 2017-04-22 NOTE — Progress Notes (Signed)
Subjective:    Patient ID: Adrienne [name], female    DOB: 1945-08-09, 71 y.o.   MRN: [id number]  HPI patient has a history of bilateral knee pain secondary to osteoarthritis.  She presents today complaining of right knee pain, decreased range of motion.  She reports stabbing pain in both knees whenever she flexes them.  She reports stabbing aching pain with walking and standing.  She is tried over-the-counter arthritis medication with no benefit.  She is requesting a cortisone injection.  I have previously given her a cortisone injection in 2017 and she saw significant benefit albeit temporarily.  She would like the medication today to try to improve the pain in her right knee.  We had a long discussion.  I explained the cortisone is not curing the problem but it is simply buying time.  Past Medical History:  Diagnosis Date  . CHF (congestive heart failure) (HCC)    EF 45-50%  . Hyperlipidemia   . Hypertension   . Knee pain   . SBO (small bowel obstruction) ([place])   . SBO (small bowel obstruction) ([place]) 10/2016  . Shoulder pain   . Smoker   . Thyroid disease    Past Surgical History:  Procedure Laterality Date  . ABDOMINAL HYSTERECTOMY  1986  . BACK SURGERY    . CHOLECYSTECTOMY    . polypectomy-oropharynx    . right knee meniscus     Current Outpatient Prescriptions on File Prior to Visit  Medication Sig Dispense Refill  . amLODipine (NORVASC) 10 MG tablet TAKE ONE TABLET BY MOUTH ONCE DAILY 90 tablet 2  . aspirin EC 81 MG tablet Take 81 mg by mouth daily.    . carvedilol (COREG) 3.125 MG tablet TAKE ONE TABLET BY MOUTH TWICE DAILY WITH MEALS 180 tablet 0  . levothyroxine (SYNTHROID, LEVOTHROID) 75 MCG tablet TAKE 1 TABLET BY MOUTH ONCE DAILY 90 tablet 1  . Multiple Vitamins-Minerals (MULTIVITAMIN WITH MINERALS) tablet Take 1 tablet by mouth daily.    . Omega-3 Fatty Acids (FISH OIL PO) Take 1 capsule by mouth daily.     Marland Kitchen OVER THE COUNTER MEDICATION Place 1 drop into both eyes  daily as needed (dry eyes). Over the counter lubricating eye drop    . naproxen sodium (ALEVE) 220 MG tablet Take 220 mg by mouth daily as needed (pain).      No current facility-administered medications on file prior to visit.    Allergies  Allergen Reactions  . Lisinopril Swelling    Angioedema  . Atorvastatin Other (See Comments)    Muscle aches     Social History   Social History  . Marital status: Widowed    Spouse name: N/A  . Number of children: N/A  . Years of education: N/A   Occupational History  . retired    Social History Main Topics  . Smoking status: Former Smoker    Packs/day: 1.00    Years: 53.00    Types: E-cigarettes    Quit date: 11/20/2016  . Smokeless tobacco: Never Used     Comment: Smoker since 1975 has quit and started back several times!!, now smoking 7 cigs/week  . Alcohol use No  . Drug use: No  . Sexual activity: Not Currently    Birth control/ protection: Surgical   Other Topics Concern  . Not on file   Social History Narrative  . No narrative on file     Review of Systems  All other systems reviewed and  are negative.      Objective:   Physical Exam  Cardiovascular: Normal rate, regular rhythm and normal heart sounds.   Pulmonary/Chest: Effort normal and breath sounds normal.  Musculoskeletal:       Right knee: She exhibits decreased range of motion. She exhibits no swelling, no LCL laxity and no MCL laxity. Tenderness found. Medial joint line and lateral joint line tenderness noted.  Vitals reviewed.         Assessment & Plan:  Right knee pain Presume her knee pain is secondary to osteoarthritis.  Using sterile technique, I injected the right knee with 2 cc of lidocaine, 2 cc of Marcaine, and 2 cc of 40 mg/mL Kenalog.  Patient tolerated the procedure well.  I am very concerned about her elevated blood pressure.  She attributes it to pain.  She cannot tolerate lisinopril due to angioedema.  I suggested adding  hydrochlorothiazide.  However the patient declined.  She states that she will check her blood pressure every day for the next week and then call us back.  If her blood pressures greater than 140/90, we will need to address her blood pressure

## 2017-04-29 ENCOUNTER — Telehealth: Payer: Self-pay | Admitting: Physician Assistant

## 2017-04-29 MED ORDER — HYDROCHLOROTHIAZIDE 25 MG PO TABS: 25.0000 mg | ORAL_TABLET | Freq: Every day | ORAL | 3 refills | Status: DC

## 2017-04-29 NOTE — Telephone Encounter (Signed)
BP readings - 152/94,150/88,178/99,151/90,186/98,178/95   [phone] CB#

## 2017-04-29 NOTE — Telephone Encounter (Addendum)
As stated in last ov with dr pickard, patient was supposed to call back and speak with you regarding her blood pressure, please call her at [phone] work number until 330 is [phone]

## 2017-04-29 NOTE — Telephone Encounter (Signed)
Patient aware of providers recommendations and med sent to pharm 

## 2017-04-29 NOTE — Telephone Encounter (Signed)
Too high, I would add hctz 25 mg a day and recheck bp in1 month at ov fo r labs.

## 2017-05-01 ENCOUNTER — Ambulatory Visit: Payer: PPO | Admitting: Physician Assistant

## 2017-06-05 ENCOUNTER — Encounter: Payer: Self-pay | Admitting: Family Medicine

## 2017-06-05 ENCOUNTER — Ambulatory Visit: Payer: PPO | Admitting: Family Medicine

## 2017-06-05 ENCOUNTER — Ambulatory Visit: Payer: PPO | Admitting: Physician Assistant

## 2017-06-05 VITALS — BP 136/78 | HR 78 | Temp 98.9°F | Resp 16 | Ht 63.0 in | Wt 186.0 lb

## 2017-06-05 DIAGNOSIS — E039 Hypothyroidism, unspecified: Secondary | ICD-10-CM | POA: Diagnosis not present

## 2017-06-05 DIAGNOSIS — M25561 Pain in right knee: Secondary | ICD-10-CM

## 2017-06-05 DIAGNOSIS — G8929 Other chronic pain: Secondary | ICD-10-CM | POA: Diagnosis not present

## 2017-06-05 DIAGNOSIS — E785 Hyperlipidemia, unspecified: Secondary | ICD-10-CM

## 2017-06-05 DIAGNOSIS — I10 Essential (primary) hypertension: Secondary | ICD-10-CM

## 2017-06-05 NOTE — Progress Notes (Signed)
Subjective:    Patient ID: Adrienne [name], female    DOB: 1946-05-15, 71 y.o.   MRN: [id number]  HPI  04/22/17  patient has a history of bilateral knee pain secondary to osteoarthritis.  She presents today complaining of right knee pain, decreased range of motion.  She reports stabbing pain in both knees whenever she flexes them.  She reports stabbing aching pain with walking and standing.  She is tried over-the-counter arthritis medication with no benefit.  She is requesting a cortisone injection.  I have previously given her a cortisone injection in 2017 and she saw significant benefit albeit temporarily.  She would like the medication today to try to improve the pain in her right knee.  We had a long discussion.  I explained the cortisone is not curing the problem but it is simply buying time.  At that time, my plan was: Presume her knee pain is secondary to osteoarthritis.  Using sterile technique, I injected the right knee with 2 cc of lidocaine, 2 cc of Marcaine, and 2 cc of 40 mg/mL Kenalog.  Patient tolerated the procedure well.  I am very concerned about her elevated blood pressure.  She attributes it to pain.  She cannot tolerate lisinopril due to angioedema.  I suggested adding hydrochlorothiazide.  However the patient declined.  She states that she will check her blood pressure every day for the next week and then call us back.  If her blood pressures greater than 140/90, we will need to address her blood pressure  06/05/17 Patient saw very little benefit from the cortisone injection in her knee.  She is interested in meeting with an orthopedic surgeon to discuss possible Hyalgan injections.  Ultimately we had to add hydrochlorothiazide for her blood pressure.  However today her blood pressure is much better.  She states that she checks her blood pressure every day and it is consistently less than 140 on the top and 90 on the bottom.  She rarely has a high number.  She denies any chest pain  shortness of breath or dyspnea on exertion.  She is also due to recheck a TSH to monitor her hypothyroidism.  She is overdue for a flu shot but she politely declines this.  Past Medical History:  Diagnosis Date  . CHF (congestive heart failure) (HCC)    EF 45-50%  . Hyperlipidemia   . Hypertension   . Knee pain   . SBO (small bowel obstruction) ([place])   . SBO (small bowel obstruction) ([place]) 10/2016  . Shoulder pain   . Smoker   . Thyroid disease    Past Surgical History:  Procedure Laterality Date  . ABDOMINAL HYSTERECTOMY  1986  . BACK SURGERY    . CHOLECYSTECTOMY    . polypectomy-oropharynx    . right knee meniscus     Current Outpatient Medications on File Prior to Visit  Medication Sig Dispense Refill  . amLODipine (NORVASC) 10 MG tablet TAKE ONE TABLET BY MOUTH ONCE DAILY 90 tablet 2  . aspirin EC 81 MG tablet Take 81 mg by mouth daily.    . carvedilol (COREG) 3.125 MG tablet TAKE ONE TABLET BY MOUTH TWICE DAILY WITH MEALS 180 tablet 0  . hydrochlorothiazide (HYDRODIURIL) 25 MG tablet Take 1 tablet (25 mg total) daily by mouth. 90 tablet 3  . levothyroxine (SYNTHROID, LEVOTHROID) 75 MCG tablet TAKE 1 TABLET BY MOUTH ONCE DAILY 90 tablet 1  . Multiple Vitamins-Minerals (MULTIVITAMIN WITH MINERALS) tablet Take 1 tablet  by mouth daily.    . naproxen sodium (ALEVE) 220 MG tablet Take 220 mg by mouth daily as needed (pain).     . Omega-3 Fatty Acids (FISH OIL PO) Take 1 capsule by mouth daily.     Marland Kitchen OVER THE COUNTER MEDICATION Place 1 drop into both eyes daily as needed (dry eyes). Over the counter lubricating eye drop     No current facility-administered medications on file prior to visit.    Allergies  Allergen Reactions  . Lisinopril Swelling    Angioedema  . Atorvastatin Other (See Comments)    Muscle aches     Social History   Socioeconomic History  . Marital status: Widowed    Spouse name: Not on file  . Number of children: Not on file  . Years of education:  Not on file  . Highest education level: Not on file  Social Needs  . Financial resource strain: Not on file  . Food insecurity - worry: Not on file  . Food insecurity - inability: Not on file  . Transportation needs - medical: Not on file  . Transportation needs - non-medical: Not on file  Occupational History  . Occupation: retired  Tobacco Use  . Smoking status: Former Smoker    Packs/day: 1.00    Years: 53.00    Pack years: 53.00    Types: E-cigarettes    Last attempt to quit: 11/20/2016    Years since quitting: 0.5  . Smokeless tobacco: Never Used  . Tobacco comment: Smoker since 1975 has quit and started back several times!!, now smoking 7 cigs/week  Substance and Sexual Activity  . Alcohol use: No  . Drug use: No  . Sexual activity: Not Currently    Birth control/protection: Surgical  Other Topics Concern  . Not on file  Social History Narrative  . Not on file     Review of Systems  All other systems reviewed and are negative.      Objective:   Physical Exam  Cardiovascular: Normal rate, regular rhythm and normal heart sounds.  Pulmonary/Chest: Effort normal and breath sounds normal.  Musculoskeletal:       Right knee: She exhibits decreased range of motion. She exhibits no swelling, no LCL laxity and no MCL laxity. Tenderness found. Medial joint line and lateral joint line tenderness noted.  Vitals reviewed.         Assessment & Plan:  Essential hypertension - Plan: COMPLETE METABOLIC PANEL WITH GFR, Lipid panel  Dyslipidemia - Plan: COMPLETE METABOLIC PANEL WITH GFR, Lipid panel  Acquired hypothyroidism - Plan: TSH  Chronic pain of right knee - Plan: Ambulatory referral to Orthopedic Surgery  Her blood pressure is now at goal.  I will make no further changes in her antihypertensive medication.  I will check a fasting lipid panel and a CMP.  Her goal LDL cholesterol is less than 130.  I will also check a TSH to ensure adequate dosage of her  levothyroxine.  Meanwhile I will also consult orthopedics to discuss possible Hyalgan injections to get better pain relief from her osteoarthritis.  I continue to encourage the flu shot but the patient politely declined

## 2017-06-06 ENCOUNTER — Encounter: Payer: Self-pay | Admitting: Family Medicine

## 2017-06-06 DIAGNOSIS — R7303 Prediabetes: Secondary | ICD-10-CM | POA: Insufficient documentation

## 2017-06-06 LAB — LIPID PANEL
CHOLESTEROL: 311 mg/dL — AB (ref <200)
HDL: 71 mg/dL (ref >50)
LDL Cholesterol (Calc): 207 mg/dL (calc) — ABNORMAL HIGH
Non-HDL Cholesterol (Calc): 240 mg/dL (calc) — ABNORMAL HIGH (ref <130)
Total CHOL/HDL Ratio: 4.4 (calc) (ref <5.0)
Triglycerides: 166 mg/dL — ABNORMAL HIGH (ref <150)

## 2017-06-06 LAB — COMPLETE METABOLIC PANEL WITH GFR
AG RATIO: 1.2 (calc) (ref 1.0–2.5)
ALBUMIN MSPROF: 3.9 g/dL (ref 3.6–5.1)
ALKALINE PHOSPHATASE (APISO): 82 U/L (ref 33–130)
ALT: 17 U/L (ref 6–29)
AST: 19 U/L (ref 10–35)
BILIRUBIN TOTAL: 0.4 mg/dL (ref 0.2–1.2)
BUN: 14 mg/dL (ref 7–25)
CHLORIDE: 103 mmol/L (ref 98–110)
CO2: 29 mmol/L (ref 20–32)
Calcium: 9.7 mg/dL (ref 8.6–10.4)
Creat: 0.95 mg/dL (ref 0.50–0.99)
GFR, Est African American: 71 mL/min/{1.73_m2} (ref >=60)
GFR, Est Non African American: 61 mL/min/{1.73_m2} (ref >=60)
GLOBULIN: 3.3 g/dL (ref 1.9–3.7)
GLUCOSE: 110 mg/dL — AB (ref 65–99)
POTASSIUM: 4.3 mmol/L (ref 3.5–5.3)
SODIUM: 140 mmol/L (ref 135–146)
TOTAL PROTEIN: 7.2 g/dL (ref 6.1–8.1)

## 2017-06-06 LAB — EXTRA LAV TOP TUBE

## 2017-06-06 LAB — TSH: TSH: 1.85 mIU/L (ref 0.40–4.50)

## 2017-06-11 ENCOUNTER — Other Ambulatory Visit: Payer: Self-pay | Admitting: Family Medicine

## 2017-06-11 MED ORDER — PRAVASTATIN SODIUM 20 MG PO TABS: 20.0000 mg | ORAL_TABLET | Freq: Every day | ORAL | 3 refills | Status: DC

## 2017-07-24 DIAGNOSIS — M170 Bilateral primary osteoarthritis of knee: Secondary | ICD-10-CM | POA: Diagnosis not present

## 2017-07-24 DIAGNOSIS — M1711 Unilateral primary osteoarthritis, right knee: Secondary | ICD-10-CM | POA: Diagnosis not present

## 2017-07-24 DIAGNOSIS — M160 Bilateral primary osteoarthritis of hip: Secondary | ICD-10-CM | POA: Diagnosis not present

## 2017-07-26 ENCOUNTER — Other Ambulatory Visit: Payer: Self-pay | Admitting: Physician Assistant

## 2017-07-28 NOTE — Telephone Encounter (Signed)
Refill appropriate 

## 2017-07-30 ENCOUNTER — Telehealth: Payer: Self-pay

## 2017-07-30 NOTE — Telephone Encounter (Signed)
   [facility] Pre-operative Risk Assessment    Request for surgical clearance:  1. What type of surgery is being performed? Right hip: THA w/wo autograft/allograft   2. When is this surgery scheduled? 10/01/2017   3. What type of clearance is required (medical clearance vs. Pharmacy clearance to hold med vs. Both)? Medical   4. Are there any medications that need to be held prior to surgery and how long?None specified (patient is on ASA)   5. Practice name and name of physician performing surgery? [facility]//Dr. Aluisio   6. What is your office phone and fax number?  1. Phone: [phone] 2. Fax: [phone]   7. Anesthesia type (None, local, MAC, general) ? Anesthesia: "Choice"

## 2017-07-30 NOTE — Telephone Encounter (Signed)
   Primary Cardiologist:No primary care provider on file.  Chart reviewed as part of pre-operative protocol coverage. Because of Adrienne [name]'s past medical history and time since last visit, he/she will require a follow-up visit in order to better assess preoperative cardiovascular risk.  Pre-op covering staff: - Please schedule appointment and call patient to inform them. - Please contact requesting surgeon's office via preferred method (i.e, phone, fax) to inform them of need for appointment prior to surgery.  Tami Lin Duke, PA  07/30/2017, 3:50 PM

## 2017-07-31 NOTE — Telephone Encounter (Signed)
LMTCB to schedule appointment for clearance (Dr. Acie Fredrickson - care team = [place], [place] & Kathlen Mody, [place])  LM for [facility] surgery scheduler with update on clearance - pending appointment/evaluation

## 2017-08-01 NOTE — Telephone Encounter (Signed)
Left message for patient to call back  

## 2017-08-05 ENCOUNTER — Ambulatory Visit (INDEPENDENT_AMBULATORY_CARE_PROVIDER_SITE_OTHER): Payer: PPO | Admitting: Family Medicine

## 2017-08-05 ENCOUNTER — Encounter: Payer: Self-pay | Admitting: Family Medicine

## 2017-08-05 VITALS — BP 130/80 | HR 60 | Temp 98.0°F | Resp 18 | Ht 63.0 in | Wt 185.0 lb

## 2017-08-05 DIAGNOSIS — J069 Acute upper respiratory infection, unspecified: Secondary | ICD-10-CM

## 2017-08-05 NOTE — Progress Notes (Signed)
Subjective:    Patient ID: Adrienne [name], female    DOB: 01-16-1946, 72 y.o.   MRN: [id number]  HPI Symptoms began 2 days ago.  Was consist of head congestion, pressure in her frontal and maxillary sinuses, postnasal drip, rhinorrhea, sore scratchy throat, and nonproductive cough.  She denies any fever.  She denies any myalgias.  She denies any abdominal pain, nausea, vomiting, or diarrhea.  She denies any chest pain or shortness of breath.  She denies any pleurisy Past Medical History:  Diagnosis Date  . CHF (congestive heart failure) (HCC)    EF 45-50%  . Hyperlipidemia   . Hypertension   . Knee pain   . Pelvic kidney   . Prediabetes   . SBO (small bowel obstruction) ([place])   . SBO (small bowel obstruction) ([place]) 10/2016  . Shoulder pain   . Smoker   . Thyroid disease    Past Surgical History:  Procedure Laterality Date  . ABDOMINAL HYSTERECTOMY  1986  . BACK SURGERY    . CHOLECYSTECTOMY    . polypectomy-oropharynx    . right knee meniscus     Current Outpatient Medications on File Prior to Visit  Medication Sig Dispense Refill  . amLODipine (NORVASC) 10 MG tablet TAKE ONE TABLET BY MOUTH ONCE DAILY 90 tablet 2  . aspirin EC 81 MG tablet Take 81 mg by mouth daily.    . carvedilol (COREG) 3.125 MG tablet TAKE ONE TABLET BY MOUTH TWICE DAILY WITH MEALS 180 tablet 0  . hydrochlorothiazide (HYDRODIURIL) 25 MG tablet Take 1 tablet (25 mg total) daily by mouth. 90 tablet 3  . levothyroxine (SYNTHROID, LEVOTHROID) 75 MCG tablet TAKE 1 TABLET BY MOUTH ONCE DAILY 90 tablet 1  . Multiple Vitamins-Minerals (MULTIVITAMIN WITH MINERALS) tablet Take 1 tablet by mouth daily.    . naproxen sodium (ALEVE) 220 MG tablet Take 220 mg by mouth daily as needed (pain).     . Omega-3 Fatty Acids (FISH OIL PO) Take 1 capsule by mouth daily.     Marland Kitchen OVER THE COUNTER MEDICATION Place 1 drop into both eyes daily as needed (dry eyes). Over the counter lubricating eye drop    . pravastatin (PRAVACHOL)  20 MG tablet Take 1 tablet (20 mg total) by mouth daily. 30 tablet 3   No current facility-administered medications on file prior to visit.    Allergies  Allergen Reactions  . Lisinopril Swelling    Angioedema  . Atorvastatin Other (See Comments)    Muscle aches     Social History   Socioeconomic History  . Marital status: Widowed    Spouse name: Not on file  . Number of children: Not on file  . Years of education: Not on file  . Highest education level: Not on file  Social Needs  . Financial resource strain: Not on file  . Food insecurity - worry: Not on file  . Food insecurity - inability: Not on file  . Transportation needs - medical: Not on file  . Transportation needs - non-medical: Not on file  Occupational History  . Occupation: retired  Tobacco Use  . Smoking status: Former Smoker    Packs/day: 1.00    Years: 53.00    Pack years: 53.00    Types: E-cigarettes    Last attempt to quit: 11/20/2016    Years since quitting: 0.7  . Smokeless tobacco: Never Used  . Tobacco comment: Smoker since 1975 has quit and started back several times!!, now smoking  7 cigs/week  Substance and Sexual Activity  . Alcohol use: No  . Drug use: No  . Sexual activity: Not Currently    Birth control/protection: Surgical  Other Topics Concern  . Not on file  Social History Narrative  . Not on file     Review of Systems  All other systems reviewed and are negative.      Objective:   Physical Exam  Constitutional: She appears well-developed and well-nourished. No distress.  HENT:  Right Ear: Tympanic membrane, external ear and ear canal normal.  Left Ear: Tympanic membrane, external ear and ear canal normal.  Nose: Nose normal.  Mouth/Throat: Oropharynx is clear and moist. No oropharyngeal exudate.  Eyes: Conjunctivae are normal.  Neck: Neck supple.  Cardiovascular: Normal rate, regular rhythm and normal heart sounds.  No murmur heard. Pulmonary/Chest: Effort normal and  breath sounds normal. No respiratory distress. She has no wheezes. She has no rales. She exhibits no tenderness.  Abdominal: Soft. Bowel sounds are normal. She exhibits no distension. There is no tenderness. There is no rebound and no guarding.  Lymphadenopathy:    She has no cervical adenopathy.  Skin: She is not diaphoretic.  Vitals reviewed.         Assessment & Plan:  Viral upper respiratory tract infection  Patient symptoms are consistent with a viral upper respiratory infection.  I recommended tincture of time.  Anticipate gradual improvement over the next 4-5 days.  Recommended Coricidin HBP for congestion.  She can use Afrin for rhinorrhea.  Recommended Mucinex DM for cough and chest congestion.  Recommended Chloraseptic for sore throat.  She can use Tylenol or ibuprofen for body aches and fever.  Recheck if worsening.

## 2017-08-08 NOTE — Telephone Encounter (Signed)
Patient is scheduled for 08/20/17 with Vin, PA.  Unable to leave updated message with Fabio Asa, Dr Alusio's surgical coordinator.

## 2017-08-17 ENCOUNTER — Ambulatory Visit: Payer: Self-pay | Admitting: Orthopedic Surgery

## 2017-08-18 NOTE — Progress Notes (Addendum)
Cardiology Office Note    Date:  08/20/2017   ID:  Caryle, Helgeson 23-May-1946, MRN [id number]  PCP:  Orlena Sheldon, PA-C  Cardiologist:  Dr. Acie Fredrickson  Chief Complaint: Cardiac clearance for Right hip: THA w/wo autograft/allograft 10/01/17   History of Present Illness:   Adrienne [name] is a 72 y.o. female prior hx of chronic systolic CHF with normalization of LVEF on subsequent echo, HTN and HLD presents for surgical clearance.   Nuclear stress test from March of 2010 was negative for ischemia.  EF of around 44% in 2010.  Her EF has now normalized to  55-60% by echo in 06/2013.  Last seen by Dr. Acie Fredrickson 08/2016.  Treated conservatively for viral URI by PCP 08/05/17.  Here today for cardiac clearance.  Limited ambulation due to chronic hip pain.  Patient denies any exertional chest pain or shortness of breath.  She denies orthopnea, PND, syncope, dizziness, palpitation, lower extremity edema or melena.  She walks around her house without any cardiac symptoms.  Past Medical History:  Diagnosis Date  . CHF (congestive heart failure) (HCC)    EF 45-50%  . Hyperlipidemia   . Hypertension   . Knee pain   . Pelvic kidney   . Prediabetes   . SBO (small bowel obstruction) ([place])   . SBO (small bowel obstruction) ([place]) 10/2016  . Shoulder pain   . Smoker   . Thyroid disease     Past Surgical History:  Procedure Laterality Date  . ABDOMINAL HYSTERECTOMY  1986  . BACK SURGERY    . CHOLECYSTECTOMY    . polypectomy-oropharynx    . right knee meniscus      Current Medications: Prior to Admission medications   Medication Sig Start Date End Date Taking? Authorizing Provider  amLODipine (NORVASC) 10 MG tablet TAKE ONE TABLET BY MOUTH ONCE DAILY 10/28/16   Orlena Sheldon, PA-C  aspirin EC 81 MG tablet Take 81 mg by mouth daily.    [provider]  carvedilol (COREG) 3.125 MG tablet TAKE ONE TABLET BY MOUTH TWICE DAILY WITH MEALS 01/02/17   Dena Billet B, PA-C    hydrochlorothiazide (HYDRODIURIL) 25 MG tablet Take 1 tablet (25 mg total) daily by mouth. 04/29/17   Susy Frizzle, MD  levothyroxine (SYNTHROID, LEVOTHROID) 75 MCG tablet TAKE 1 TABLET BY MOUTH ONCE DAILY 07/28/17   Orlena Sheldon, PA-C  Multiple Vitamins-Minerals (MULTIVITAMIN WITH MINERALS) tablet Take 1 tablet by mouth daily.    [provider]  naproxen sodium (ALEVE) 220 MG tablet Take 220 mg by mouth daily as needed (pain).     [provider]  Omega-3 Fatty Acids (FISH OIL PO) Take 1 capsule by mouth daily.     [provider]  OVER THE COUNTER MEDICATION Place 1 drop into both eyes daily as needed (dry eyes). Over the counter lubricating eye drop    [provider]  pravastatin (PRAVACHOL) 20 MG tablet Take 1 tablet (20 mg total) by mouth daily. 06/11/17   Susy Frizzle, MD    Allergies:   Lisinopril and Atorvastatin   Social History   Socioeconomic History  . Marital status: Widowed    Spouse name: None  . Number of children: None  . Years of education: None  . Highest education level: None  Social Needs  . Financial resource strain: None  . Food insecurity - worry: None  . Food insecurity - inability: None  . Transportation needs - medical:  None  . Transportation needs - non-medical: None  Occupational History  . Occupation: retired  Tobacco Use  . Smoking status: Former Smoker    Packs/day: 1.00    Years: 53.00    Pack years: 53.00    Types: E-cigarettes    Last attempt to quit: 11/20/2016    Years since quitting: 0.7  . Smokeless tobacco: Never Used  . Tobacco comment: Smoker since 1975 has quit and started back several times!!, now smoking 7 cigs/week  Substance and Sexual Activity  . Alcohol use: No  . Drug use: No  . Sexual activity: Not Currently    Birth control/protection: Surgical  Other Topics Concern  . None  Social History Narrative  . None     Family History:  The patient's family history includes Breast  cancer in her sister; Cancer in her sister; Diabetes in her brother and brother; Hypertension in her other.   ROS:   Please see the history of present illness.    ROS All other systems reviewed and are negative.   PHYSICAL EXAM:   VS:  BP 126/76   Pulse 95   Ht 5\' 3"  (1.6 m)   Wt 188 lb 12.8 oz (85.6 kg)   SpO2 95%   BMI 33.44 kg/m    GEN: Well nourished, well developed, in no acute distress  HEENT: normal  Neck: no JVD, carotid bruits, or masses Cardiac: RRR; no murmurs, rubs, or gallops,no edema  Respiratory:  clear to auscultation bilaterally, normal work of breathing GI: soft, nontender, nondistended, + BS MS: no deformity or atrophy  Skin: warm and dry, no rash Neuro:  Alert and Oriented x 3, Strength and sensation are intact Psych: euthymic mood, full affect  Wt Readings from Last 3 Encounters:  08/20/17 188 lb 12.8 oz (85.6 kg)  08/05/17 185 lb (83.9 kg)  06/05/17 186 lb (84.4 kg)      Studies/Labs Reviewed:   EKG:  EKG is ordered today.  The ekg ordered today demonstrates sinus bradycardia at rate of 51 bpm with nonspecific T wave abnormality.  No significant changes from prior EKG.  Recent Labs: 11/20/2016: Hemoglobin 12.6; Magnesium 1.6; Platelets 309 06/05/2017: ALT 17; BUN 14; Creat 0.95; Potassium 4.3; Sodium 140; TSH 1.85   Lipid Panel    Component Value Date/Time   CHOL 311 (H) 06/05/2017 1032   TRIG 166 (H) 06/05/2017 1032   HDL 71 06/05/2017 1032   CHOLHDL 4.4 06/05/2017 1032   VLDL 20 04/08/2016 0933   LDLCALC 125 04/08/2016 0933    Additional studies/ records that were reviewed today include:   As above    ASSESSMENT & PLAN:    1. HTN Stable and well controlled on current regimen.  2. HLD - intolerance to lipitor. 06/05/2017: Cholesterol 311; HDL 71; Triglycerides 166.   - She was also not able to tolerate  Pravachol.  Fllowed by PCP  3. Hx of systolic CHF - Last echo 01/1190 showed normalization of LVEF.  No CHF symptoms.   Euvolemic on exam.   4.  Preoperative cardiac clearance. -Patient denies any anginal or CHF symptoms.  She does not take aspirin 81 mg on a daily basis.  She only takes once or twice every week.  Okay to hold aspirin for 1 week as needed prior to surgery.  Encourage daily compliance post surgery.  Her Revised Cardiac Risk Index [place] criteria) is 0.9% for MI, pulmonary edema, VF, cardiac arrest or CHF.  Given past medical history and time  since last visit, based on ACC/AHA guidelines, TRULEE HAMSTRA would be at acceptable risk for the planned procedure without further cardiovascular testing.   I will route this recommendation to the requesting party via Epic fax function and remove from pre-op pool.  Please call with questions.    Medication Adjustments/Labs and Tests Ordered: Current medicines are reviewed at length with the patient today.  Concerns regarding medicines are outlined above.  Medication changes, Labs and Tests ordered today are listed in the Patient Instructions below. Patient Instructions  Medication Instructions:   Your physician recommends that you continue on your current medications as directed. Please refer to the Current Medication list given to you today.   If you need a refill on your cardiac medications before your next appointment, please call your pharmacy.  Labwork: NONE ORDERED  TODAY    Testing/Procedures: NONE ORDERED  TODAY   Follow-Up:  Your physician wants you to follow-up in:  IN  [place] will receive a reminder letter in the mail two months in advance. If you don't receive a letter, please call our office to schedule the follow-up appointment.      Any Other Special Instructions Will Be Listed Below (If Applicable).                                                                                                                                                      Jarrett Soho, [place]  08/20/2017 4:13  PM    [facility] [address], [place], [place]  [address] Phone: [phone]; Fax: [phone]

## 2017-08-20 ENCOUNTER — Encounter: Payer: Self-pay | Admitting: Physician Assistant

## 2017-08-20 ENCOUNTER — Ambulatory Visit: Payer: PPO | Admitting: Physician Assistant

## 2017-08-20 VITALS — BP 126/76 | HR 95 | Ht 63.0 in | Wt 188.8 lb

## 2017-08-20 DIAGNOSIS — I10 Essential (primary) hypertension: Secondary | ICD-10-CM

## 2017-08-20 DIAGNOSIS — I5022 Chronic systolic (congestive) heart failure: Secondary | ICD-10-CM | POA: Diagnosis not present

## 2017-08-20 DIAGNOSIS — E782 Mixed hyperlipidemia: Secondary | ICD-10-CM | POA: Diagnosis not present

## 2017-08-20 DIAGNOSIS — Z01818 Encounter for other preprocedural examination: Secondary | ICD-10-CM

## 2017-08-20 NOTE — Patient Instructions (Signed)
Medication Instructions:   Your physician recommends that you continue on your current medications as directed. Please refer to the Current Medication list given to you today.  If you need a refill on your cardiac medications before your next appointment, please call your pharmacy.  Labwork: NONE ORDERED  TODAY     Testing/Procedures: NONE ORDERED  TODAY    Follow-Up: Your physician wants you to follow-up in:  IN 6  MONTHS WITH DR NAHSER  You will receive a reminder letter in the mail two months in advance. If you don't receive a letter, please call our office to schedule the follow-up appointment.       Any Other Special Instructions Will Be Listed Below (If Applicable).                                                                                                                                                   

## 2017-09-10 DIAGNOSIS — H2513 Age-related nuclear cataract, bilateral: Secondary | ICD-10-CM | POA: Diagnosis not present

## 2017-09-10 DIAGNOSIS — H40023 Open angle with borderline findings, high risk, bilateral: Secondary | ICD-10-CM | POA: Diagnosis not present

## 2017-09-10 DIAGNOSIS — H25013 Cortical age-related cataract, bilateral: Secondary | ICD-10-CM | POA: Diagnosis not present

## 2017-09-10 DIAGNOSIS — H04123 Dry eye syndrome of bilateral lacrimal glands: Secondary | ICD-10-CM | POA: Diagnosis not present

## 2017-09-24 ENCOUNTER — Encounter (HOSPITAL_COMMUNITY): Payer: Self-pay

## 2017-09-24 NOTE — Patient Instructions (Signed)
IOMA CHISMAR  09/24/2017   Your procedure is scheduled on: 10-01-17  Report to [facility] Main  Entrance               Report to admitting at      200 PM   Call this number if you have problems the morning of surgery [phone]    Remember: Do not eat food:After Midnight.  You may have clear liquids until 1030 am then nothing by mouth.     Take these medicines the morning of surgery with A SIP OF WATER: synthroid, carvedilol DO NOT TAKE ANY DIABETIC MEDICATIONS DAY OF YOUR SURGERY                               You may not have any metal on your body including hair pins and              piercings  Do not wear jewelry, make-up, lotions, powders or perfumes, deodorant             Do not wear nail polish.  Do not shave  48 hours prior to surgery.                Do not bring valuables to the hospital. [place].  Contacts, dentures or bridgework may not be worn into surgery.  Leave suitcase in the car. After surgery it may be brought to your room.               Please read over the following fact sheets you were given: _____________________________________________________________________    CLEAR LIQUID DIET   Foods Allowed                                                                     Foods Excluded  Coffee and tea, regular and decaf                             liquids that you cannot  Plain Jell-O in any flavor                                             see through such as: Fruit ices (not with fruit pulp)                                     milk, soups, orange juice  Iced Popsicles                                    All solid food Carbonated beverages, regular and diet  Cranberry, grape and apple juices Sports drinks like Gatorade Lightly seasoned clear broth or consume(fat free) Sugar, honey syrup  Sample Menu Breakfast                                 Lunch                                     Supper Cranberry juice                    Beef broth                            Chicken broth Jell-O                                     Grape juice                           Apple juice Coffee or tea                        Jell-O                                      Popsicle                                                Coffee or tea                        Coffee or tea  _____________________________________________________________________  [facility] - Preparing for Surgery Before surgery, you can play an important role.  Because skin is not sterile, your skin needs to be as free of germs as possible.  You can reduce the number of germs on your skin by washing with CHG (chlorahexidine gluconate) soap before surgery.  CHG is an antiseptic cleaner which kills germs and bonds with the skin to continue killing germs even after washing. Please DO NOT use if you have an allergy to CHG or antibacterial soaps.  If your skin becomes reddened/irritated stop using the CHG and inform your nurse when you arrive at Short Stay. Do not shave (including legs and underarms) for at least 48 hours prior to the first CHG shower.  You may shave your face/neck. Please follow these instructions carefully:  1.  Shower with CHG Soap the night before surgery and the  morning of Surgery.  2.  If you choose to wash your hair, wash your hair first as usual with your  normal  shampoo.  3.  After you shampoo, rinse your hair and body thoroughly to remove the  shampoo.                           4.  Use CHG as you would any other liquid soap.  You can apply chg directly  to the skin and wash  Gently with a scrungie or clean washcloth.  5.  Apply the CHG Soap to your body ONLY FROM THE NECK DOWN.   Do not use on face/ open                           Wound or open sores. Avoid contact with eyes, ears mouth and genitals (private parts).                       Wash face,   Genitals (private parts) with your normal soap.             6.  Wash thoroughly, paying special attention to the area where your surgery  will be performed.  7.  Thoroughly rinse your body with warm water from the neck down.  8.  DO NOT shower/wash with your normal soap after using and rinsing off  the CHG Soap.                9.  Pat yourself dry with a clean towel.            10.  Wear clean pajamas.            11.  Place clean sheets on your bed the night of your first shower and do not  sleep with pets. Day of Surgery : Do not apply any lotions/deodorants the morning of surgery.  Please wear clean clothes to the hospital/surgery center.  FAILURE TO FOLLOW THESE INSTRUCTIONS MAY RESULT IN THE CANCELLATION OF YOUR SURGERY PATIENT SIGNATURE_________________________________  NURSE SIGNATURE__________________________________  ________________________________________________________________________  WHAT IS A BLOOD TRANSFUSION? Blood Transfusion Information  A transfusion is the replacement of blood or some of its parts. Blood is made up of multiple cells which provide different functions.  Red blood cells carry oxygen and are used for blood loss replacement.  White blood cells fight against infection.  Platelets control bleeding.  Plasma helps clot blood.  Other blood products are available for specialized needs, such as hemophilia or other clotting disorders. BEFORE THE TRANSFUSION  Who gives blood for transfusions?   Healthy volunteers who are fully evaluated to make sure their blood is safe. This is blood bank blood. Transfusion therapy is the safest it has ever been in the practice of medicine. Before blood is taken from a donor, a complete history is taken to make sure that person has no history of diseases nor engages in risky social behavior (examples are intravenous drug use or sexual activity with multiple partners). The donor's travel history is screened to minimize risk  of transmitting infections, such as malaria. The donated blood is tested for signs of infectious diseases, such as HIV and hepatitis. The blood is then tested to be sure it is compatible with you in order to minimize the chance of a transfusion reaction. If you or a relative donates blood, this is often done in anticipation of surgery and is not appropriate for emergency situations. It takes many days to process the donated blood. RISKS AND COMPLICATIONS Although transfusion therapy is very safe and saves many lives, the main dangers of transfusion include:   Getting an infectious disease.  Developing a transfusion reaction. This is an allergic reaction to something in the blood you were given. Every precaution is taken to prevent this. The decision to have a blood transfusion has been considered carefully by your caregiver before blood is given. Blood is not given unless the benefits  outweigh the risks. AFTER THE TRANSFUSION  Right after receiving a blood transfusion, you will usually feel much better and more energetic. This is especially true if your red blood cells have gotten low (anemic). The transfusion raises the level of the red blood cells which carry oxygen, and this usually causes an energy increase.  The nurse administering the transfusion will monitor you carefully for complications. HOME CARE INSTRUCTIONS  No special instructions are needed after a transfusion. You may find your energy is better. Speak with your caregiver about any limitations on activity for underlying diseases you may have. SEEK MEDICAL CARE IF:   Your condition is not improving after your transfusion.  You develop redness or irritation at the intravenous (IV) site. SEEK IMMEDIATE MEDICAL CARE IF:  Any of the following symptoms occur over the next 12 hours:  Shaking chills.  You have a temperature by mouth above 102 F (38.9 C), not controlled by medicine.  Chest, back, or muscle pain.  People around  you feel you are not acting correctly or are confused.  Shortness of breath or difficulty breathing.  Dizziness and fainting.  You get a rash or develop hives.  You have a decrease in urine output.  Your urine turns a dark color or changes to pink, red, or brown. Any of the following symptoms occur over the next 10 days:  You have a temperature by mouth above 102 F (38.9 C), not controlled by medicine.  Shortness of breath.  Weakness after normal activity.  The white part of the eye turns yellow (jaundice).  You have a decrease in the amount of urine or are urinating less often.  Your urine turns a dark color or changes to pink, red, or brown. Document Released: 06/07/2000 Document Revised: 09/02/2011 Document Reviewed: 01/25/2008 ExitCare Patient Information 2014 [place].  _______________________________________________________________________  Incentive Spirometer  An incentive spirometer is a tool that can help keep your lungs clear and active. This tool measures how well you are filling your lungs with each breath. Taking long deep breaths may help reverse or decrease the chance of developing breathing (pulmonary) problems (especially infection) following:  A long period of time when you are unable to move or be active. BEFORE THE PROCEDURE   If the spirometer includes an indicator to show your best effort, your nurse or respiratory therapist will set it to a desired goal.  If possible, sit up straight or lean slightly forward. Try not to slouch.  Hold the incentive spirometer in an upright position. INSTRUCTIONS FOR USE  1. Sit on the edge of your bed if possible, or sit up as far as you can in bed or on a chair. 2. Hold the incentive spirometer in an upright position. 3. Breathe out normally. 4. Place the mouthpiece in your mouth and seal your lips tightly around it. 5. Breathe in slowly and as deeply as possible, raising the piston or the ball toward the  top of the column. 6. Hold your breath for 3-5 seconds or for as long as possible. Allow the piston or ball to fall to the bottom of the column. 7. Remove the mouthpiece from your mouth and breathe out normally. 8. Rest for a few seconds and repeat Steps 1 through 7 at least 10 times every 1-2 hours when you are awake. Take your time and take a few normal breaths between deep breaths. 9. The spirometer may include an indicator to show your best effort. Use the indicator as a goal to  work toward during each repetition. 10. After each set of 10 deep breaths, practice coughing to be sure your lungs are clear. If you have an incision (the cut made at the time of surgery), support your incision when coughing by placing a pillow or rolled up towels firmly against it. Once you are able to get out of bed, walk around indoors and cough well. You may stop using the incentive spirometer when instructed by your caregiver.  RISKS AND COMPLICATIONS  Take your time so you do not get dizzy or light-headed.  If you are in pain, you may need to take or ask for pain medication before doing incentive spirometry. It is harder to take a deep breath if you are having pain. AFTER USE  Rest and breathe slowly and easily.  It can be helpful to keep track of a log of your progress. Your caregiver can provide you with a simple table to help with this. If you are using the spirometer at home, follow these instructions: [place] IF:   You are having difficultly using the spirometer.  You have trouble using the spirometer as often as instructed.  Your pain medication is not giving enough relief while using the spirometer.  You develop fever of 100.5 F (38.1 C) or higher. SEEK IMMEDIATE MEDICAL CARE IF:   You cough up bloody sputum that had not been present before.  You develop fever of 102 F (38.9 C) or greater.  You develop worsening pain at or near the incision site. MAKE SURE YOU:   Understand  these instructions.  Will watch your condition.  Will get help right away if you are not doing well or get worse. Document Released: 10/21/2006 Document Revised: 09/02/2011 Document Reviewed: 12/22/2006 [facility] Patient Information 2014 [place], [place].   ________________________________________________________________________

## 2017-09-26 ENCOUNTER — Other Ambulatory Visit: Payer: Self-pay

## 2017-09-26 ENCOUNTER — Encounter (HOSPITAL_COMMUNITY)
Admission: RE | Admit: 2017-09-26 | Discharge: 2017-09-26 | Disposition: A | Discharge status: Home / Self Care | Payer: PPO | Source: Ambulatory Visit | Attending: Orthopedic Surgery | Admitting: Orthopedic Surgery

## 2017-09-26 ENCOUNTER — Encounter (HOSPITAL_COMMUNITY): Payer: Self-pay

## 2017-09-26 ENCOUNTER — Ambulatory Visit: Payer: Self-pay | Admitting: Orthopedic Surgery

## 2017-09-26 DIAGNOSIS — Z79899 Other long term (current) drug therapy: Secondary | ICD-10-CM | POA: Diagnosis not present

## 2017-09-26 DIAGNOSIS — Z7901 Long term (current) use of anticoagulants: Secondary | ICD-10-CM | POA: Insufficient documentation

## 2017-09-26 DIAGNOSIS — E785 Hyperlipidemia, unspecified: Secondary | ICD-10-CM | POA: Insufficient documentation

## 2017-09-26 DIAGNOSIS — Z01818 Encounter for other preprocedural examination: Secondary | ICD-10-CM | POA: Diagnosis not present

## 2017-09-26 DIAGNOSIS — I5022 Chronic systolic (congestive) heart failure: Secondary | ICD-10-CM | POA: Diagnosis not present

## 2017-09-26 DIAGNOSIS — Z79890 Hormone replacement therapy (postmenopausal): Secondary | ICD-10-CM | POA: Insufficient documentation

## 2017-09-26 DIAGNOSIS — E039 Hypothyroidism, unspecified: Secondary | ICD-10-CM | POA: Insufficient documentation

## 2017-09-26 DIAGNOSIS — R7303 Prediabetes: Secondary | ICD-10-CM | POA: Insufficient documentation

## 2017-09-26 DIAGNOSIS — M1612 Unilateral primary osteoarthritis, left hip: Secondary | ICD-10-CM | POA: Diagnosis not present

## 2017-09-26 DIAGNOSIS — I110 Hypertensive heart disease with heart failure: Secondary | ICD-10-CM | POA: Diagnosis not present

## 2017-09-26 HISTORY — DX: Sleep apnea, unspecified: G47.30

## 2017-09-26 HISTORY — DX: Hypothyroidism, unspecified: E03.9

## 2017-09-26 HISTORY — DX: Cardiac arrhythmia, unspecified: I49.9

## 2017-09-26 HISTORY — DX: Prediabetes: R73.03

## 2017-09-26 HISTORY — DX: Headache: R51

## 2017-09-26 HISTORY — DX: Unspecified osteoarthritis, unspecified site: M19.90

## 2017-09-26 LAB — COMPREHENSIVE METABOLIC PANEL
ALBUMIN: 3.7 g/dL (ref 3.5–5.0)
ALK PHOS: 68 U/L (ref 38–126)
ALT: 16 U/L (ref 14–54)
AST: 24 U/L (ref 15–41)
Anion gap: 9 (ref 5–15)
BILIRUBIN TOTAL: 0.9 mg/dL (ref 0.3–1.2)
BUN: 14 mg/dL (ref 6–20)
CALCIUM: 9.5 mg/dL (ref 8.9–10.3)
CO2: 27 mmol/L (ref 22–32)
CREATININE: 0.91 mg/dL (ref 0.44–1.00)
Chloride: 105 mmol/L (ref 101–111)
GFR calc Af Amer: >60 mL/min (ref >60)
GFR calc non Af Amer: >60 mL/min (ref >60)
GLUCOSE: 97 mg/dL (ref 65–99)
Potassium: 4.2 mmol/L (ref 3.5–5.1)
Sodium: 141 mmol/L (ref 135–145)
TOTAL PROTEIN: 8.1 g/dL (ref 6.5–8.1)

## 2017-09-26 LAB — CBC
HEMATOCRIT: 39.2 % (ref 36.0–46.0)
HEMOGLOBIN: 13.2 g/dL (ref 12.0–15.0)
MCH: 31.2 pg (ref 26.0–34.0)
MCHC: 33.7 g/dL (ref 30.0–36.0)
MCV: 92.7 fL (ref 78.0–100.0)
Platelets: 280 10*3/uL (ref 150–400)
RBC: 4.23 MIL/uL (ref 3.87–5.11)
RDW: 13.9 % (ref 11.5–15.5)
WBC: 9.5 10*3/uL (ref 4.0–10.5)

## 2017-09-26 LAB — SURGICAL PCR SCREEN
MRSA, PCR: INVALID — AB
STAPHYLOCOCCUS AUREUS: INVALID — AB

## 2017-09-26 LAB — PROTIME-INR
INR: 0.94
Prothrombin Time: 12.5 seconds (ref 11.4–15.2)

## 2017-09-26 LAB — HEMOGLOBIN A1C
HEMOGLOBIN A1C: 5.8 % — AB (ref 4.8–5.6)
MEAN PLASMA GLUCOSE: 119.76 mg/dL

## 2017-09-26 LAB — APTT: APTT: 27 s (ref 24–36)

## 2017-09-26 LAB — ABO/RH: ABO/RH(D): B POS

## 2017-09-26 NOTE — Progress Notes (Signed)
Left VM with Adrienne [name] for consent order to be corrected to Left hip as surgery is booked so pt. Can sign consent am of surgery.

## 2017-09-26 NOTE — Progress Notes (Signed)
EKG 08-20-17 Epic  CLEARANCE 08-20-17 Epic NOTE HEART CARE  CLEARANCE CHART 07-31-17 Leanor Kail PA-C

## 2017-09-27 LAB — MRSA CULTURE: CULTURE: NOT DETECTED

## 2017-09-28 ENCOUNTER — Ambulatory Visit: Payer: Self-pay | Admitting: Orthopedic Surgery

## 2017-09-28 NOTE — H&P (Signed)
KEITA, DEMARCO (72yo, F) DOB June 22, 1946    Chief Complaint Bilateral Hip Pain H&P for LEFT THA on 10/01/2017 at [facility]  Patient's Care Team Primary Care Provider: [facility] [place] PA: [address] [place], [place] [address], Ph [phone], Fax [phone] NPI: [id number] [facility] [place] [address] [facility]): [place], [place] [address], Ph [phone], Fax [phone]   Vitals Ht: 5 ft 4 in  Wt: 185 lbs BMI: 31.8  BP: 146/70 sitting R arm  Pulse: 60 bpm regular   Allergies Reviewed Allergies LISINOPRIL: Facial swelling (Severe)    Medications Reviewed Medications amLODIPine 10 mg tablet 05/27/17   filled RXOPTIONS carvedilol 3.125 mg tablet 01/06/17   filled RXOPTIONS Fish Oil 09/28/17   entered ALEXZANDREW PERKINS, PA-C hydroCHLOROthiazide 25 mg tablet 04/29/17   filled RXOPTIONS levothyroxine 75 mcg tablet 07/29/17   filled RXOPTIONS One A Day Vitamin 09/28/17   entered ALEXZANDREW PERKINS, PA-C traMADol 50 mg tablet 1-2 tablets q 6 h prn pain 08/27/17   filled RXOPTIONS    Problems Reviewed Problems Osteoarthritis of hip  Osteoarthritis of knee    Family History Reviewed Family History Father - Father deceased Mother - Mother deceased Unspecified Relation - Sarcoidosis   Social History Reviewed Social History Smoking Status: Former smoker Tobacco-years of use: 33 Occupation: RETIRED Chewing tobacco: none Alcohol intake: None Hand Dominance: Right Work related injury?: N Are on Hormone Therapy?: N Caffeine: N Advance directive: N Freight forwarder of Attorney: N Live alone or with others?: with others Marital status: Widowed   Surgical History Reviewed Surgical History Operation on intestine Laparoscopic excision of ruptured ectopic tubal pregnancy Knee Arthroscopy Spine Cholecystectomy Hysterectomy - 06/24/1984   Past Medical History Reviewed Past Medical History High Cholesterol:  Y Hypertension: Y Sleep apnea: Y   HPI The patient is here today for a pre-operative History and Physical. They are scheduled for left total hip replacement on 10-01-2017 with Dr. [name] at [place] was originally seen for the chief complaint of bilateral knee pain. She has been having trouble for over 10 years. She has had a previous scope with MMD in the right knee in 2003 as well as cortisone injection. She did not see improvement with the injection she had last year. She has also had pain in both hips radiating to both knees. Interventions on her knees in the past had not provided much benefit. She feels like she has lost a lot of motion and is having a hard time doing regular activities such as putting on shoes and socks or getting out of a chair. She is at a stage where she has lost a lot of function because of this. Radiographs-AP and lateral of both knees do show moderate medial and patellofemoral degenerative changes but not fully bone-on-bone. AP pelvis lateral both hips show severe end-stage arthritis of both hips bone-on-bone with large osteophyte formation. The RIGHT hip is slightly worse than the LEFT. She has advanced arthritis of both hips which is felt to be the main problem right now. Her motion is very limited and the pain is radiating to her knees. She also has arthritis in the knees but the hips are causing more functional problems. At this point I would recommend doing the total hip arthroplasties. We did discuss this in detail and the rationale behind fixing the hips before the knees. She has elected to proceed with the hip surgery. She was going to get the right hip  done but the left hip has been more problematic recently and wanted to proceed with that side first.   ROS Constitutional: Constitutional: no fever, chills, night sweats, or significant weight loss.  Cardiovascular: Cardiovascular: no palpitations or chest pain.  Respiratory: Respiratory: no  cough or shortness of breath and No COPD.  Gastrointestinal: Gastrointestinal: no vomiting and nausea.  Musculoskeletal: Musculoskeletal: no swelling in Joints and Joint Pain.  Neurologic: Neurologic: no numbness, tingling, or difficulty with balance.    Physical Exam Patient is a 72 year old female.  General Mental Status - Alert, cooperative and good historian. General Appearance - pleasant, Not in acute distress. Orientation - Oriented X3. Build & Nutrition - Well nourished and Well developed.  Head and Neck Head - normocephalic, atraumatic . Neck Global Assessment - supple, no bruit auscultated on the right, no bruit auscultated on the left.  Eye Pupil - Bilateral - PERR Motion - Bilateral - EOMI.  Chest and Lung Exam Auscultation Breath sounds - clear at anterior chest wall and clear at posterior chest wall. Adventitious sounds - No Adventitious sounds.  Cardiovascular Auscultation Rhythm - Regular rate and rhythm. Heart Sounds - S1 WNL and S2 WNL. Murmurs & Other Heart Sounds - Auscultation of the heart reveals - No Murmurs.  Abdomen Palpation/Percussion Tenderness - Abdomen is non-tender to palpation. Abdomen is soft. Auscultation Auscultation of the abdomen reveals - Bowel sounds normal.  Genitourinary Note: Not done, not pertinent to present illness  Musculoskeletal RIGHT hip can be flexed to 100 with no internal rotation. Proximally 20 of external rotation and 20 of abduction. LEFT hip flexes to 100 with no internal rotation 20 of external rotation 20 of abduction. There is no tenderness over the greater trochanters. Both knees show no effusion. RIGHT knee range 5-125. There is moderate crepitus on range of motion. There is some tenderness medial greater than lateral with no instability noted. LEFT knee no effusion with range 5-130. There is moderate crepitus on range of motion with tenderness medial greater than lateral no instability  noted.  Radiographs-AP and lateral of both knees do show moderate medial and patellofemoral degenerative changes but not fully bone-on-bone. AP pelvis lateral both hips show severe end-stage arthritis of both hips bone-on-bone with large osteophyte formation. The RIGHT hip is slightly worse than the LEFT.   Assessment / Plan 1. Osteoarthritis of hip M16.11: Unilateral primary osteoarthritis, right hip  2. Osteoarthritis of left hip joint M16.12: Unilateral primary osteoarthritis, left hip  Patient Instructions Surgical Plans: Left Total Hip Replacement - Anterior Approach Disposition: Home, HHPT vs. Outpatient [facility] Cards: Dr. Rae Halsted IV TXA Anesthesia Issues: None Patient was instructed on what medications to stop prior to surgery. - Follow up visit in 2 weeks with Dr. [name] - Begin physical therapy following surgery - Pre-operative lab work as pre Pre-Surgical Testing - Prescriptions will be provided in hospital at time of discharge  Anticipated LOS equal to or greater than 2 midnights due to - Age 60 and older with one or more of the following:  - Obesity  - Expected need for hospital services (PT, OT, Nursing) required for safe  discharge  - Anticipated need for postoperative skilled nursing care or inpatient rehab  - Active co-morbidities: Hypertension, Hypercholesterolemina   Return to Office [name], MD for [facility] on 10/14/2017 at 02:15 PM  Encounter signed-off by Mickel Crow, PA-C

## 2017-09-30 ENCOUNTER — Ambulatory Visit: Payer: Self-pay | Admitting: Orthopedic Surgery

## 2017-09-30 MED ORDER — TRANEXAMIC ACID 1000 MG/10ML IV SOLN
1000.0000 mg | INTRAVENOUS | Status: AC
  Administered 2017-10-01: 1000 mg via INTRAVENOUS
  Filled 2017-09-30: qty 1100

## 2017-09-30 NOTE — Progress Notes (Signed)
Called Dr Aluisio's office and requested orders for consent for 10/01/17 surgery. Consent was incorrect at PST appointment.

## 2017-09-30 NOTE — Progress Notes (Signed)
I placed the new order for the Left Total Hip into Epic on 09/26/2017. I will input the order again to make sure that it is available. Arlee Muslim, PA-C    Informed Consent Details: Transcribe to consent form and obtain patient signature (Order [id number])  Nursing  Date: 09/26/2017 Department: [facility] Released By/Authorizing: Joelene Millin, PA-C (auto-released)   Benay Pike NPI: [id number]    Patient Information   Patient Name Adrienne [name], Adrienne [name] Sex Female DOB Nov 10, 1945 SSN [id number]  Order Information   Order Date/Time Release Date/Time Start Date/Time End Date/Time  09/26/17 03:38 PM 09/26/17 03:38 PM 09/26/17 03:38 PM 09/26/17 03:38 PM  Order History  Inpatient  Date/Time Action Taken User Additional Information  09/26/17 1538 Release Dara Lords, Doylene Canard, PA-C (auto-released) From Order: [id number]  Order Questions   Question Answer Comment  Procedure Left Total Hip Arthroplasty - Anterior Approach   Surgeon Dr. [name]   Indication/Reason osteoarthritis left hip

## 2017-09-30 NOTE — H&P (View-Only) (Signed)
I placed the new order for the Left Total Hip into Epic on 09/26/2017. I will input the order again to make sure that it is available. Arlee Muslim, PA-C    Informed Consent Details: Transcribe to consent form and obtain patient signature (Order [id number])  Nursing  Date: 09/26/2017 Department: [facility] Released By/Authorizing: Joelene Millin, PA-C (auto-released)   Benay Pike NPI: [id number]    Patient Information   Patient Name Adrienne [name], Adrienne [name] Sex Female DOB Feb 05, 1946 SSN [id number]  Order Information   Order Date/Time Release Date/Time Start Date/Time End Date/Time  09/26/17 03:38 PM 09/26/17 03:38 PM 09/26/17 03:38 PM 09/26/17 03:38 PM  Order History  Inpatient  Date/Time Action Taken User Additional Information  09/26/17 1538 Release Dara Lords, Doylene Canard, PA-C (auto-released) From Order: [id number]  Order Questions   Question Answer Comment  Procedure Left Total Hip Arthroplasty - Anterior Approach   Surgeon Dr. [name]   Indication/Reason osteoarthritis left hip

## 2017-10-01 ENCOUNTER — Encounter (HOSPITAL_COMMUNITY)
Admission: RE | Disposition: A | Discharge status: Home Health | Payer: Self-pay | Source: Ambulatory Visit | Attending: Orthopedic Surgery

## 2017-10-01 ENCOUNTER — Inpatient Hospital Stay (HOSPITAL_COMMUNITY): Payer: PPO

## 2017-10-01 ENCOUNTER — Other Ambulatory Visit: Payer: Self-pay

## 2017-10-01 ENCOUNTER — Inpatient Hospital Stay (HOSPITAL_COMMUNITY)
Admission: RE | Admit: 2017-10-01 | Discharge: 2017-10-02 | DRG: 470 | Disposition: A | Discharge status: Home Health | Payer: PPO | Source: Ambulatory Visit | Attending: Orthopedic Surgery | Admitting: Orthopedic Surgery

## 2017-10-01 ENCOUNTER — Inpatient Hospital Stay (HOSPITAL_COMMUNITY): Payer: PPO | Admitting: Anesthesiology

## 2017-10-01 ENCOUNTER — Encounter (HOSPITAL_COMMUNITY): Payer: Self-pay

## 2017-10-01 DIAGNOSIS — E039 Hypothyroidism, unspecified: Secondary | ICD-10-CM | POA: Diagnosis not present

## 2017-10-01 DIAGNOSIS — G4730 Sleep apnea, unspecified: Secondary | ICD-10-CM | POA: Diagnosis not present

## 2017-10-01 DIAGNOSIS — I10 Essential (primary) hypertension: Secondary | ICD-10-CM | POA: Diagnosis present

## 2017-10-01 DIAGNOSIS — Z87891 Personal history of nicotine dependence: Secondary | ICD-10-CM | POA: Diagnosis not present

## 2017-10-01 DIAGNOSIS — M1612 Unilateral primary osteoarthritis, left hip: Secondary | ICD-10-CM | POA: Diagnosis not present

## 2017-10-01 DIAGNOSIS — Z471 Aftercare following joint replacement surgery: Secondary | ICD-10-CM | POA: Diagnosis not present

## 2017-10-01 DIAGNOSIS — I110 Hypertensive heart disease with heart failure: Secondary | ICD-10-CM | POA: Diagnosis not present

## 2017-10-01 DIAGNOSIS — R269 Unspecified abnormalities of gait and mobility: Secondary | ICD-10-CM | POA: Diagnosis not present

## 2017-10-01 DIAGNOSIS — I5022 Chronic systolic (congestive) heart failure: Secondary | ICD-10-CM | POA: Diagnosis not present

## 2017-10-01 DIAGNOSIS — Z96649 Presence of unspecified artificial hip joint: Secondary | ICD-10-CM

## 2017-10-01 DIAGNOSIS — M25752 Osteophyte, left hip: Secondary | ICD-10-CM | POA: Diagnosis not present

## 2017-10-01 DIAGNOSIS — E785 Hyperlipidemia, unspecified: Secondary | ICD-10-CM | POA: Diagnosis present

## 2017-10-01 DIAGNOSIS — M169 Osteoarthritis of hip, unspecified: Secondary | ICD-10-CM | POA: Diagnosis present

## 2017-10-01 DIAGNOSIS — Z96642 Presence of left artificial hip joint: Secondary | ICD-10-CM | POA: Diagnosis not present

## 2017-10-01 HISTORY — PX: TOTAL HIP ARTHROPLASTY: SHX124

## 2017-10-01 LAB — TYPE AND SCREEN
ABO/RH(D): B POS
Antibody Screen: NEGATIVE

## 2017-10-01 SURGERY — ARTHROPLASTY, HIP, TOTAL, ANTERIOR APPROACH
Anesthesia: Monitor Anesthesia Care | Site: Hip | Laterality: Left

## 2017-10-01 MED ORDER — PROPOFOL 10 MG/ML IV BOLUS
INTRAVENOUS | Status: AC
  Filled 2017-10-01: qty 20

## 2017-10-01 MED ORDER — RIVAROXABAN 10 MG PO TABS
10.0000 mg | ORAL_TABLET | Freq: Every day | ORAL | Status: DC
  Administered 2017-10-02: 10 mg via ORAL
  Filled 2017-10-01: qty 1

## 2017-10-01 MED ORDER — STERILE WATER FOR IRRIGATION IR SOLN
Status: DC | PRN
  Administered 2017-10-01: 2000 mL

## 2017-10-01 MED ORDER — BUPIVACAINE IN DEXTROSE 0.75-8.25 % IT SOLN
INTRATHECAL | Status: DC | PRN
  Administered 2017-10-01: 15 mg via INTRATHECAL

## 2017-10-01 MED ORDER — ONDANSETRON HCL 4 MG/2ML IJ SOLN
INTRAMUSCULAR | Status: DC | PRN
  Administered 2017-10-01: 4 mg via INTRAVENOUS

## 2017-10-01 MED ORDER — CEFAZOLIN SODIUM-DEXTROSE 2-4 GM/100ML-% IV SOLN
2.0000 g | Freq: Four times a day (QID) | INTRAVENOUS | Status: AC
  Administered 2017-10-01 – 2017-10-02 (×2): 2 g via INTRAVENOUS
  Filled 2017-10-01 (×2): qty 100

## 2017-10-01 MED ORDER — HYDROCHLOROTHIAZIDE 25 MG PO TABS
25.0000 mg | ORAL_TABLET | Freq: Every day | ORAL | Status: DC
  Administered 2017-10-02: 25 mg via ORAL
  Filled 2017-10-01: qty 1

## 2017-10-01 MED ORDER — TRANEXAMIC ACID 1000 MG/10ML IV SOLN
1000.0000 mg | Freq: Once | INTRAVENOUS | Status: AC
  Administered 2017-10-01: 19:00:00 1000 mg via INTRAVENOUS
  Filled 2017-10-01: qty 1100

## 2017-10-01 MED ORDER — PROMETHAZINE HCL 25 MG/ML IJ SOLN: 6.2500 mg | INTRAMUSCULAR | Status: DC | PRN

## 2017-10-01 MED ORDER — FENTANYL CITRATE (PF) 100 MCG/2ML IJ SOLN
INTRAMUSCULAR | Status: AC
  Filled 2017-10-01: qty 2

## 2017-10-01 MED ORDER — DEXAMETHASONE SODIUM PHOSPHATE 10 MG/ML IJ SOLN
10.0000 mg | Freq: Once | INTRAMUSCULAR | Status: AC
  Administered 2017-10-02: 10 mg via INTRAVENOUS
  Filled 2017-10-01: qty 1

## 2017-10-01 MED ORDER — KETOROLAC TROMETHAMINE 30 MG/ML IJ SOLN
INTRAMUSCULAR | Status: AC
  Filled 2017-10-01: qty 1

## 2017-10-01 MED ORDER — BUPIVACAINE HCL (PF) 0.25 % IJ SOLN
INTRAMUSCULAR | Status: AC
  Filled 2017-10-01: qty 30

## 2017-10-01 MED ORDER — LEVOTHYROXINE SODIUM 75 MCG PO TABS
75.0000 ug | ORAL_TABLET | Freq: Every day | ORAL | Status: DC
  Administered 2017-10-02: 10:00:00 75 ug via ORAL
  Filled 2017-10-01: qty 1

## 2017-10-01 MED ORDER — ONDANSETRON HCL 4 MG PO TABS
4.0000 mg | ORAL_TABLET | Freq: Four times a day (QID) | ORAL | Status: DC | PRN
  Filled 2017-10-01: qty 1

## 2017-10-01 MED ORDER — ACETAMINOPHEN 325 MG PO TABS
325.0000 mg | ORAL_TABLET | Freq: Four times a day (QID) | ORAL | Status: DC | PRN
Start: 2017-10-02 — End: 2017-10-02

## 2017-10-01 MED ORDER — POLYETHYLENE GLYCOL 3350 17 G PO PACK: 17.0000 g | PACK | Freq: Every day | ORAL | Status: DC | PRN

## 2017-10-01 MED ORDER — FLEET ENEMA 7-19 GM/118ML RE ENEM: 1.0000 | ENEMA | Freq: Once | RECTAL | Status: DC | PRN

## 2017-10-01 MED ORDER — CARVEDILOL 3.125 MG PO TABS
3.1250 mg | ORAL_TABLET | ORAL | Status: AC
  Administered 2017-10-01: 3.125 mg via ORAL
  Filled 2017-10-01: qty 1

## 2017-10-01 MED ORDER — METOCLOPRAMIDE HCL 5 MG/ML IJ SOLN: 5.0000 mg | Freq: Three times a day (TID) | INTRAMUSCULAR | Status: DC | PRN

## 2017-10-01 MED ORDER — CEFAZOLIN SODIUM-DEXTROSE 2-4 GM/100ML-% IV SOLN
2.0000 g | INTRAVENOUS | Status: AC
  Administered 2017-10-01: 2 g via INTRAVENOUS
  Filled 2017-10-01: qty 100

## 2017-10-01 MED ORDER — SODIUM CHLORIDE 0.9 % IR SOLN
Status: DC | PRN
  Administered 2017-10-01: 1000 mL

## 2017-10-01 MED ORDER — AMLODIPINE BESYLATE 10 MG PO TABS
10.0000 mg | ORAL_TABLET | Freq: Every day | ORAL | Status: DC
  Administered 2017-10-02: 12:00:00 10 mg via ORAL
  Filled 2017-10-01: qty 1

## 2017-10-01 MED ORDER — PROPOFOL 500 MG/50ML IV EMUL
INTRAVENOUS | Status: DC | PRN
  Administered 2017-10-01: 100 ug/kg/min via INTRAVENOUS

## 2017-10-01 MED ORDER — MIDAZOLAM HCL 5 MG/5ML IJ SOLN
INTRAMUSCULAR | Status: DC | PRN
  Administered 2017-10-01: 2 mg via INTRAVENOUS

## 2017-10-01 MED ORDER — ACETAMINOPHEN 500 MG PO TABS
1000.0000 mg | ORAL_TABLET | Freq: Four times a day (QID) | ORAL | Status: DC
  Administered 2017-10-01 – 2017-10-02 (×2): 1000 mg via ORAL
  Filled 2017-10-01 (×3): qty 2

## 2017-10-01 MED ORDER — BISACODYL 10 MG RE SUPP: 10.0000 mg | Freq: Every day | RECTAL | Status: DC | PRN

## 2017-10-01 MED ORDER — CARVEDILOL 3.125 MG PO TABS
3.1250 mg | ORAL_TABLET | Freq: Two times a day (BID) | ORAL | Status: DC
  Administered 2017-10-02: 3.125 mg via ORAL
  Filled 2017-10-01: qty 1

## 2017-10-01 MED ORDER — TRAMADOL HCL 50 MG PO TABS
50.0000 mg | ORAL_TABLET | Freq: Four times a day (QID) | ORAL | Status: DC | PRN
Start: 2017-10-01 — End: 2017-10-02

## 2017-10-01 MED ORDER — FENTANYL CITRATE (PF) 100 MCG/2ML IJ SOLN
25.0000 ug | INTRAMUSCULAR | Status: DC | PRN
  Administered 2017-10-01 (×2): 50 ug via INTRAVENOUS

## 2017-10-01 MED ORDER — METHOCARBAMOL 1000 MG/10ML IJ SOLN
500.0000 mg | Freq: Four times a day (QID) | INTRAVENOUS | Status: DC | PRN
  Administered 2017-10-01: 500 mg via INTRAVENOUS
  Filled 2017-10-01: qty 550

## 2017-10-01 MED ORDER — LACTATED RINGERS IV SOLN
INTRAVENOUS | Status: DC
  Administered 2017-10-01 (×2): via INTRAVENOUS

## 2017-10-01 MED ORDER — ONDANSETRON HCL 4 MG/2ML IJ SOLN
INTRAMUSCULAR | Status: AC
  Filled 2017-10-01: qty 2

## 2017-10-01 MED ORDER — CHLORHEXIDINE GLUCONATE 4 % EX LIQD: 60.0000 mL | Freq: Once | CUTANEOUS | Status: DC

## 2017-10-01 MED ORDER — ACETAMINOPHEN 10 MG/ML IV SOLN
1000.0000 mg | Freq: Once | INTRAVENOUS | Status: AC
  Administered 2017-10-01: 1000 mg via INTRAVENOUS
  Filled 2017-10-01: qty 100

## 2017-10-01 MED ORDER — DIPHENHYDRAMINE HCL 12.5 MG/5ML PO ELIX: 12.5000 mg | ORAL_SOLUTION | ORAL | Status: DC | PRN

## 2017-10-01 MED ORDER — DOCUSATE SODIUM 100 MG PO CAPS
100.0000 mg | ORAL_CAPSULE | Freq: Two times a day (BID) | ORAL | Status: DC
  Administered 2017-10-01: 21:00:00 100 mg via ORAL
  Filled 2017-10-01 (×2): qty 1

## 2017-10-01 MED ORDER — MENTHOL 3 MG MT LOZG: 1.0000 | LOZENGE | OROMUCOSAL | Status: DC | PRN

## 2017-10-01 MED ORDER — BUPIVACAINE HCL (PF) 0.25 % IJ SOLN
INTRAMUSCULAR | Status: DC | PRN
  Administered 2017-10-01: 30 mL

## 2017-10-01 MED ORDER — PHENOL 1.4 % MT LIQD
1.0000 | OROMUCOSAL | Status: DC | PRN
  Filled 2017-10-01: qty 177

## 2017-10-01 MED ORDER — DEXAMETHASONE SODIUM PHOSPHATE 10 MG/ML IJ SOLN
10.0000 mg | Freq: Once | INTRAMUSCULAR | Status: AC
  Administered 2017-10-01: 10 mg via INTRAVENOUS

## 2017-10-01 MED ORDER — METOCLOPRAMIDE HCL 5 MG PO TABS: 5.0000 mg | ORAL_TABLET | Freq: Three times a day (TID) | ORAL | Status: DC | PRN

## 2017-10-01 MED ORDER — KETOROLAC TROMETHAMINE 30 MG/ML IJ SOLN
30.0000 mg | Freq: Once | INTRAMUSCULAR | Status: AC
  Administered 2017-10-01: 30 mg via INTRAVENOUS

## 2017-10-01 MED ORDER — OXYCODONE HCL 5 MG PO TABS: 10.0000 mg | ORAL_TABLET | ORAL | Status: DC | PRN

## 2017-10-01 MED ORDER — ONDANSETRON HCL 4 MG/2ML IJ SOLN
4.0000 mg | Freq: Four times a day (QID) | INTRAMUSCULAR | Status: DC | PRN
  Administered 2017-10-02: 08:00:00 4 mg via INTRAVENOUS
  Filled 2017-10-01: qty 2

## 2017-10-01 MED ORDER — FENTANYL CITRATE (PF) 100 MCG/2ML IJ SOLN
INTRAMUSCULAR | Status: DC | PRN
  Administered 2017-10-01: 100 ug via INTRAVENOUS

## 2017-10-01 MED ORDER — SODIUM CHLORIDE 0.9 % IV SOLN
INTRAVENOUS | Status: DC
  Administered 2017-10-01 – 2017-10-02 (×2): via INTRAVENOUS

## 2017-10-01 MED ORDER — METHOCARBAMOL 500 MG PO TABS: 500.0000 mg | ORAL_TABLET | Freq: Four times a day (QID) | ORAL | Status: DC | PRN

## 2017-10-01 MED ORDER — MIDAZOLAM HCL 2 MG/2ML IJ SOLN
INTRAMUSCULAR | Status: AC
  Filled 2017-10-01: qty 2

## 2017-10-01 MED ORDER — HYDROMORPHONE HCL 1 MG/ML IJ SOLN: 0.5000 mg | INTRAMUSCULAR | Status: DC | PRN

## 2017-10-01 MED ORDER — OXYCODONE HCL 5 MG PO TABS
5.0000 mg | ORAL_TABLET | ORAL | Status: DC | PRN
  Administered 2017-10-01 – 2017-10-02 (×2): 5 mg via ORAL
  Filled 2017-10-01 (×2): qty 1

## 2017-10-01 SURGICAL SUPPLY — 35 items
BAG DECANTER FOR FLEXI CONT (MISCELLANEOUS) ×3 IMPLANT
BAG SPEC THK2 15X12 ZIP CLS (MISCELLANEOUS)
BAG ZIPLOCK 12X15 (MISCELLANEOUS) IMPLANT
BLADE SAG 18X100X1.27 (BLADE) ×3 IMPLANT
CAPT HIP TOTAL 2 ×2 IMPLANT
CLOSURE WOUND 1/2 X4 (GAUZE/BANDAGES/DRESSINGS) ×1
CLOTH BEACON ORANGE TIMEOUT ST (SAFETY) ×3 IMPLANT
COVER PERINEAL POST (MISCELLANEOUS) ×3 IMPLANT
COVER SURGICAL LIGHT HANDLE (MISCELLANEOUS) ×3 IMPLANT
DECANTER SPIKE VIAL GLASS SM (MISCELLANEOUS) ×3 IMPLANT
DRAPE STERI IOBAN 125X83 (DRAPES) ×3 IMPLANT
DRAPE U-SHAPE 47X51 STRL (DRAPES) ×6 IMPLANT
DRSG ADAPTIC 3X8 NADH LF (GAUZE/BANDAGES/DRESSINGS) ×3 IMPLANT
DRSG MEPILEX BORDER 4X4 (GAUZE/BANDAGES/DRESSINGS) ×3 IMPLANT
DRSG MEPILEX BORDER 4X8 (GAUZE/BANDAGES/DRESSINGS) ×3 IMPLANT
DURAPREP 26ML APPLICATOR (WOUND CARE) ×3 IMPLANT
ELECT REM PT RETURN 15FT ADLT (MISCELLANEOUS) ×3 IMPLANT
EVACUATOR 1/8 PVC DRAIN (DRAIN) ×3 IMPLANT
GLOVE BIO SURGEON STRL SZ7.5 (GLOVE) ×3 IMPLANT
GLOVE BIO SURGEON STRL SZ8 (GLOVE) ×6 IMPLANT
GLOVE BIOGEL PI IND STRL 8 (GLOVE) ×2 IMPLANT
GLOVE BIOGEL PI INDICATOR 8 (GLOVE) ×4
GOWN STRL REUS W/TWL LRG LVL3 (GOWN DISPOSABLE) ×3 IMPLANT
GOWN STRL REUS W/TWL XL LVL3 (GOWN DISPOSABLE) ×3 IMPLANT
PACK ANTERIOR HIP CUSTOM (KITS) ×3 IMPLANT
STRIP CLOSURE SKIN 1/2X4 (GAUZE/BANDAGES/DRESSINGS) ×2 IMPLANT
SUT ETHIBOND NAB CT1 #1 30IN (SUTURE) ×3 IMPLANT
SUT MNCRL AB 4-0 PS2 18 (SUTURE) ×3 IMPLANT
SUT STRATAFIX 0 PDS 27 VIOLET (SUTURE) ×3
SUT VIC AB 2-0 CT1 27 (SUTURE) ×6
SUT VIC AB 2-0 CT1 TAPERPNT 27 (SUTURE) ×2 IMPLANT
SUTURE STRATFX 0 PDS 27 VIOLET (SUTURE) ×1 IMPLANT
SYR 50ML LL SCALE MARK (SYRINGE) IMPLANT
TRAY FOLEY W/METER SILVER 16FR (SET/KITS/TRAYS/PACK) ×3 IMPLANT
YANKAUER SUCT BULB TIP 10FT TU (MISCELLANEOUS) ×3 IMPLANT

## 2017-10-01 NOTE — Discharge Instructions (Addendum)
Dr. [name] Total Joint Specialist [facility] [address], [place], [place] [address] [phone]  ANTERIOR APPROACH TOTAL HIP REPLACEMENT POSTOPERATIVE DIRECTIONS   Hip Rehabilitation, Guidelines Following Surgery  The results of a hip operation are greatly improved after range of motion and muscle strengthening exercises. Follow all safety measures which are given to protect your hip. If any of these exercises cause increased pain or swelling in your joint, decrease the amount until you are comfortable again. Then slowly increase the exercises. Call your caregiver if you have problems or questions.   HOME CARE INSTRUCTIONS  Remove items at home which could result in a fall. This includes throw rugs or furniture in walking pathways.   ICE to the affected hip every three hours for 30 minutes at a time and then as needed for pain and swelling.  Continue to use ice on the hip for pain and swelling from surgery. You may notice swelling that will progress down to the foot and ankle.  This is normal after surgery.  Elevate the leg when you are not up walking on it.    Continue to use the breathing machine which will help keep your temperature down.  It is common for your temperature to cycle up and down following surgery, especially at night when you are not up moving around and exerting yourself.  The breathing machine keeps your lungs expanded and your temperature down.   DIET You may resume your previous home diet once your are discharged from the hospital.  DRESSING / WOUND CARE / SHOWERING You may shower 3 days after surgery, but keep the wounds dry during showering.  You may use an occlusive plastic wrap (Press'n Seal for example), NO SOAKING/SUBMERGING IN THE BATHTUB.  If the bandage gets wet, change with a clean dry gauze.  If the incision gets wet, pat the wound dry with a clean towel. You may start showering once you are discharged home but do not submerge  the incision under water. Just pat the incision dry and apply a dry gauze dressing on daily. Change the surgical dressing daily and reapply a dry dressing each time.  ACTIVITY Walk with your walker as instructed. Use walker as long as suggested by your caregivers. Avoid periods of inactivity such as sitting longer than an hour when not asleep. This helps prevent blood clots.  You may resume a sexual relationship in one month or when given the OK by your doctor.  You may return to work once you are cleared by your doctor.  Do not drive a car for 6 weeks or until released by you surgeon.  Do not drive while taking narcotics.  WEIGHT BEARING Weight bearing as tolerated with assist device (walker, cane, etc) as directed, use it as long as suggested by your surgeon or therapist, typically at least 4-6 weeks.  POSTOPERATIVE CONSTIPATION PROTOCOL Constipation - defined medically as fewer than three stools per week and severe constipation as less than one stool per week.  One of the most common issues patients have following surgery is constipation.  Even if you have a regular bowel pattern at home, your normal regimen is likely to be disrupted due to multiple reasons following surgery.  Combination of anesthesia, postoperative narcotics, change in appetite and fluid intake all can affect your bowels.  In order to avoid complications following surgery, here are some recommendations in order to help you during your recovery period.  Colace (docusate) - Pick up an over-the-counter  form of Colace or another stool softener and take twice a day as long as you are requiring postoperative pain medications.  Take with a full glass of water daily.  If you experience loose stools or diarrhea, hold the colace until you stool forms back up.  If your symptoms do not get better within 1 week or if they get worse, check with your doctor.  Dulcolax (bisacodyl) - Pick up over-the-counter and take as directed by the  product packaging as needed to assist with the movement of your bowels.  Take with a full glass of water.  Use this product as needed if not relieved by Colace only.   MiraLax (polyethylene glycol) - Pick up over-the-counter to have on hand.  MiraLax is a solution that will increase the amount of water in your bowels to assist with bowel movements.  Take as directed and can mix with a glass of water, juice, soda, coffee, or tea.  Take if you go more than two days without a movement. Do not use MiraLax more than once per day. Call your doctor if you are still constipated or irregular after using this medication for 7 days in a row.  If you continue to have problems with postoperative constipation, please contact the office for further assistance and recommendations.  If you experience "the worst abdominal pain ever" or develop nausea or vomiting, please contact the office immediatly for further recommendations for treatment.  ITCHING  If you experience itching with your medications, try taking only a single pain pill, or even half a pain pill at a time.  You can also use Benadryl over the counter for itching or also to help with sleep.   TED HOSE STOCKINGS Wear the elastic stockings on both legs for three weeks following surgery during the day but you may remove then at night for sleeping.  MEDICATIONS See your medication summary on the After Visit Summary that the nursing staff will review with you prior to discharge.  You may have some home medications which will be placed on hold until you complete the course of blood thinner medication.  It is important for you to complete the blood thinner medication as prescribed by your surgeon.  Continue your approved medications as instructed at time of discharge.  PRECAUTIONS If you experience chest pain or shortness of breath - call 911 immediately for transfer to the hospital emergency department.  If you develop a fever greater that 101 F, purulent  drainage from wound, increased redness or drainage from wound, foul odor from the wound/dressing, or calf pain - CONTACT YOUR SURGEON.                                                   FOLLOW-UP APPOINTMENTS Make sure you keep all of your appointments after your operation with your surgeon and caregivers. You should call the office at the above phone number and make an appointment for approximately two weeks after the date of your surgery or on the date instructed by your surgeon outlined in the "After Visit Summary".  RANGE OF MOTION AND STRENGTHENING EXERCISES  These exercises are designed to help you keep full movement of your hip joint. Follow your caregiver's or physical therapist's instructions. Perform all exercises about fifteen times, three times per day or as directed. Exercise both hips, even if you  had only one joint replacement. These exercises can be done on a training (exercise) mat, on the floor, on a table or on a bed. Use whatever works the best and is most comfortable for you. Use music or television while you are exercising so that the exercises are a pleasant break in your day. This will make your life better with the exercises acting as a break in routine you can look forward to.  °• Lying on your back, slowly slide your foot toward your buttocks, raising your knee up off the floor. Then slowly slide your foot back down until your leg is straight again.  °• Lying on your back spread your legs as far apart as you can without causing discomfort.  °• Lying on your side, raise your upper leg and foot straight up from the floor as far as is comfortable. Slowly lower the leg and repeat.  °• Lying on your back, tighten up the muscle in the front of your thigh (quadriceps muscles). You can do this by keeping your leg straight and trying to raise your heel off the floor. This helps strengthen the largest muscle supporting your knee.  °• Lying on your back, tighten up the muscles of your buttocks both  with the legs straight and with the knee bent at a comfortable angle while keeping your heel on the floor.  ° °IF YOU ARE TRANSFERRED TO A SKILLED REHAB FACILITY °If the patient is transferred to a skilled rehab facility following release from the hospital, a list of the current medications will be sent to the facility for the patient to continue.  When discharged from the skilled rehab facility, please have the facility set up the patient's Home Health Physical Therapy prior to being released. Also, the skilled facility will be responsible for providing the patient with their medications at time of release from the facility to include their pain medication, the muscle relaxants, and their blood thinner medication. If the patient is still at the rehab facility at time of the two week follow up appointment, the skilled rehab facility will also need to assist the patient in arranging follow up appointment in our office and any transportation needs. ° °MAKE SURE YOU:  °• Understand these instructions.  °• Get help right away if you are not doing well or get worse.  ° ° °Pick up stool softner and laxative for home use following surgery while on pain medications. °Do not submerge incision under water. °Please use good hand washing techniques while changing dressing each day. °May shower starting three days after surgery. °Please use a clean towel to pat the incision dry following showers. °Continue to use ice for pain and swelling after surgery. °Do not use any lotions or creams on the incision until instructed by your surgeon. ° °Information on my medicine - XARELTO® (Rivaroxaban) ° °Why was Xarelto® prescribed for you? °Xarelto® was prescribed for you to reduce the risk of blood clots forming after orthopedic surgery. The medical term for these abnormal blood clots is venous thromboembolism (VTE). ° °What do you need to know about xarelto® ? °Take your Xarelto® ONCE DAILY at the same time every day. °You may take it  either with or without food. ° °If you have difficulty swallowing the tablet whole, you may crush it and mix in applesauce just prior to taking your dose. ° °Take Xarelto® exactly as prescribed by your doctor and DO NOT stop taking Xarelto® without talking to the doctor who prescribed the medication.    Stopping without other VTE prevention medication to take the place of Xarelto® may increase your risk of developing a clot. ° °After discharge, you should have regular check-up appointments with your healthcare provider that is prescribing your Xarelto®.   ° °What do you do if you miss a dose? °If you miss a dose, take it as soon as you remember on the same day then continue your regularly scheduled once daily regimen the next day. Do not take two doses of Xarelto® on the same day.  ° °Important Safety Information °A possible side effect of Xarelto® is bleeding. You should call your healthcare provider right away if you experience any of the following: °? Bleeding from an injury or your nose that does not stop. °? Unusual colored urine (red or dark brown) or unusual colored stools (red or black). °? Unusual bruising for unknown reasons. °? A serious fall or if you hit your head (even if there is no bleeding). ° °Some medicines may interact with Xarelto® and might increase your risk of bleeding while on Xarelto®. To help avoid this, consult your healthcare provider or pharmacist prior to using any new prescription or non-prescription medications, including herbals, vitamins, non-steroidal anti-inflammatory drugs (NSAIDs) and supplements. ° °This website has more information on Xarelto®: www.xarelto.com. ° ° ° °

## 2017-10-01 NOTE — Transfer of Care (Signed)
Immediate Anesthesia Transfer of Care Note  Patient: MAGIN BALBI  Procedure(s) Performed: LEFT  TOTAL HIP ARTHROPLASTY ANTERIOR APPROACH (Left Hip)  Patient Location: PACU  Anesthesia Type:Spinal  Level of Consciousness: sedated  Airway & Oxygen Therapy: Patient Spontanous Breathing and Patient connected to face mask oxygen  Post-op Assessment: Report given to RN and Post -op Vital signs reviewed and stable  Post vital signs: Reviewed and stable  Last Vitals:  Vitals Value Taken Time  BP    Temp    Pulse 57 10/01/2017  4:07 PM  Resp 18 10/01/2017  4:07 PM  SpO2 100 % 10/01/2017  4:07 PM  Vitals shown include unvalidated device data.  Last Pain:  Vitals:   10/01/17 1234  TempSrc:   PainSc: 0-No pain         Complications: No apparent anesthesia complications

## 2017-10-01 NOTE — Op Note (Signed)
OPERATIVE REPORT- TOTAL HIP ARTHROPLASTY   PREOPERATIVE DIAGNOSIS: Osteoarthritis of the Left hip.   POSTOPERATIVE DIAGNOSIS: Osteoarthritis of the Left  hip.   PROCEDURE: Left total hip arthroplasty, anterior approach.   SURGEON: [name], MD   ASSISTANT: Arlee Muslim, PA-C  ANESTHESIA:  Spinal  ESTIMATED BLOOD LOSS:-300 mL    DRAINS: Hemovac x1.   COMPLICATIONS: None   CONDITION: PACU - hemodynamically stable.   BRIEF CLINICAL NOTE: Adrienne [name] is a 72 y.o. female who has advanced end-  stage arthritis of their Left  hip with progressively worsening pain and  dysfunction.The patient has failed nonoperative management and presents for  total hip arthroplasty.   PROCEDURE IN DETAIL: After successful administration of spinal  anesthetic, the traction boots for the [facility] bed were placed on both  feet and the patient was placed onto the [facility] bed, boots placed into the leg  holders. The Left hip was then isolated from the perineum with plastic  drapes and prepped and draped in the usual sterile fashion. ASIS and  greater trochanter were marked and a oblique incision was made, starting  at about 1 cm lateral and 2 cm distal to the ASIS and coursing towards  the anterior cortex of the femur. The skin was cut with a 10 blade  through subcutaneous tissue to the level of the fascia overlying the  tensor fascia lata muscle. The fascia was then incised in line with the  incision at the junction of the anterior third and posterior 2/3rd. The  muscle was teased off the fascia and then the interval between the TFL  and the rectus was developed. The Hohmann retractor was then placed at  the top of the femoral neck over the capsule. The vessels overlying the  capsule were cauterized and the fat on top of the capsule was removed.  A Hohmann retractor was then placed anterior underneath the rectus  femoris to give exposure to the entire anterior capsule. A T-shaped   capsulotomy was performed. The edges were tagged and the femoral head  was identified.       Osteophytes are removed off the superior acetabulum.  The femoral neck was then cut in situ with an oscillating saw. Traction  was then applied to the left lower extremity utilizing the [facility]  traction. The femoral head was then removed. Retractors were placed  around the acetabulum and then circumferential removal of the labrum was  performed. Osteophytes were also removed. Reaming starts at 45 mm to  medialize and  Increased in 2 mm increments to 49 mm. We reamed in  approximately 40 degrees of abduction, 20 degrees anteversion. A 50 mm  pinnacle acetabular shell was then impacted in anatomic position under  fluoroscopic guidance with excellent purchase. We did not need to place  any additional dome screws. A 32 mm neutral + 4 marathon liner was then  placed into the acetabular shell.       The femoral lift was then placed along the lateral aspect of the femur  just distal to the vastus ridge. The leg was  externally rotated and capsule  was stripped off the inferior aspect of the femoral neck down to the  level of the lesser trochanter, this was done with electrocautery. The femur was lifted after this was performed. The  leg was then placed in an extended and adducted position essentially delivering the femur. We also removed the capsule superiorly and the piriformis from the piriformis  fossa to gain excellent exposure of the  proximal femur. Rongeur was used to remove some cancellous bone to get  into the lateral portion of the proximal femur for placement of the  initial starter reamer. The starter broaches was placed  the starter broach  and was shown to go down the center of the canal. Broaching  with the  Corail system was then performed starting at size 8, coursing  Up to size 11. A size 11 had excellent torsional and rotational  and axial stability. The trial high offset neck was then  placed  with a 32 + 1 trial head. The hip was then reduced. We confirmed that  the stem was in the canal both on AP and lateral x-rays. It also has excellent sizing. The hip was reduced with outstanding stability through full extension and full external rotation.. AP pelvis was taken and the leg lengths were measured and found to be equal. Hip was then dislocated again and the femoral head and neck removed. The  femoral broach was removed. Size 11 Corail stem with a high offset  neck was then impacted into the femur following native anteversion. Has  excellent purchase in the canal. Excellent torsional and rotational and  axial stability. It is confirmed to be in the canal on AP and lateral  fluoroscopic views. The 32 + 1 ceramic head was placed and the hip  reduced with outstanding stability. Again AP pelvis was taken and it  confirmed that the leg lengths were equal. The wound was then copiously  irrigated with saline solution and the capsule reattached and repaired  with Ethibond suture. 30 ml of .25% Bupivicaine was  injected into the capsule and into the edge of the tensor fascia lata as well as subcutaneous tissue. The fascia overlying the tensor fascia lata was then closed with a running #1 V-Loc. Subcu was closed with interrupted 2-0 Vicryl and subcuticular running 4-0 Monocryl. Incision was cleaned  and dried. Steri-Strips and a bulky sterile dressing applied. Hemovac  drain was hooked to suction and then the patient was awakened and transported to  recovery in stable condition.        Please note that a surgical assistant was a medical necessity for this procedure to perform it in a safe and expeditious manner. Assistant was necessary to provide appropriate retraction of vital neurovascular structures and to prevent femoral fracture and allow for anatomic placement of the prosthesis.  [name], M.D.

## 2017-10-01 NOTE — Anesthesia Postprocedure Evaluation (Signed)
Anesthesia Post Note  Patient: Adrienne [name]  Procedure(s) Performed: LEFT  TOTAL HIP ARTHROPLASTY ANTERIOR APPROACH (Left Hip)     Patient location during evaluation: PACU Anesthesia Type: MAC and Spinal Level of consciousness: awake and alert Pain management: pain level controlled Vital Signs Assessment: post-procedure vital signs reviewed and stable Respiratory status: spontaneous breathing and respiratory function stable Cardiovascular status: blood pressure returned to baseline and stable Postop Assessment: spinal receding Anesthetic complications: no    Last Vitals:  Vitals:   10/01/17 1745 10/01/17 1800  BP: (!) 162/82 (!) 151/82  Pulse: (!) 47 60  Resp: 14 13  Temp:  36.4 C  SpO2: 100% 100%    Last Pain:  Vitals:   10/01/17 1800  TempSrc:   PainSc: 6     LLE Motor Response: Purposeful movement (10/01/17 1800) LLE Sensation: Full sensation (10/01/17 1800) RLE Motor Response: Purposeful movement (10/01/17 1800) RLE Sensation: Full sensation (10/01/17 1800) L Sensory Level: S1-Sole of foot, small toes (10/01/17 1800) R Sensory Level: S1-Sole of foot, small toes (10/01/17 1800)  Otisha Spickler DANIEL

## 2017-10-01 NOTE — Interval H&P Note (Signed)
History and Physical Interval Note:  10/01/2017 12:51 PM  Adrienne [name]  has presented today for surgery, with the diagnosis of Osteoarthritis LEFT  Hip  The various methods of treatment have been discussed with the patient and family. After consideration of risks, benefits and other options for treatment, the patient has consented to  Procedure(s): LEFT  TOTAL HIP ARTHROPLASTY ANTERIOR APPROACH (Left) as a surgical intervention .  The patient's history has been reviewed, patient examined, no change in status, stable for surgery.  I have reviewed the patient's chart and labs.  Questions were answered to the patient's satisfaction.     Pilar Plate Jubilee Vivero

## 2017-10-01 NOTE — Anesthesia Procedure Notes (Signed)
Spinal  Patient location during procedure: OR Start time: 10/01/2017 2:01 PM End time: 10/01/2017 2:08 PM Staffing Anesthesiologist: Duane Boston, MD Performed: anesthesiologist  Preanesthetic Checklist Completed: patient identified, surgical consent, pre-op evaluation, timeout performed, IV checked, risks and benefits discussed and monitors and equipment checked Spinal Block Patient position: sitting Prep: DuraPrep Patient monitoring: cardiac monitor, continuous pulse ox and blood pressure Approach: midline Location: L2-3 Injection technique: single-shot Needle Needle type: Pencan  Needle gauge: 24 G Needle length: 9 cm Additional Notes Functioning IV was confirmed and monitors were applied. Sterile prep and drape, including hand hygiene and sterile gloves were used. The patient was positioned and the spine was prepped. The skin was anesthetized with lidocaine.  Free flow of clear CSF was obtained prior to injecting local anesthetic into the CSF.  The spinal needle aspirated freely following injection.  The needle was carefully withdrawn.  The patient tolerated the procedure well.

## 2017-10-01 NOTE — Anesthesia Preprocedure Evaluation (Addendum)
Anesthesia Evaluation  Patient identified by MRN, date of birth, ID band Patient awake    Reviewed: Allergy & Precautions, NPO status , Patient's Chart, lab work & pertinent test results  History of Anesthesia Complications Negative for: history of anesthetic complications  Airway Mallampati: II  TM Distance: >3 FB Neck ROM: Full    Dental no notable dental hx. (+) Dental Advisory Given   Pulmonary sleep apnea and Continuous Positive Airway Pressure Ventilation , former smoker,    Pulmonary exam normal        Cardiovascular hypertension, +CHF  Normal cardiovascular exam  Study Conclusions  - Left ventricle: The cavity size was normal. Wall thickness was normal. Systolic function was normal. The estimated ejection fraction was in the range of 55% to 60%. Wall motion was normal; there were no regional wall motion abnormalities.   Neuro/Psych negative neurological ROS  negative psych ROS   GI/Hepatic negative GI ROS, Neg liver ROS,   Endo/Other  Hypothyroidism   Renal/GU negative Renal ROS  negative genitourinary   Musculoskeletal   Abdominal   Peds negative pediatric ROS (+)  Hematology negative hematology ROS (+)   Anesthesia Other Findings   Reproductive/Obstetrics negative OB ROS                            Anesthesia Physical Anesthesia Plan  ASA: III  Anesthesia Plan: Spinal and MAC   Post-op Pain Management:    Induction: Intravenous  PONV Risk Score and Plan: 2 and Ondansetron and Propofol infusion  Airway Management Planned: Natural Airway and Simple Face Mask  Additional Equipment:   Intra-op Plan:   Post-operative Plan:   Informed Consent: I have reviewed the patients History and Physical, chart, labs and discussed the procedure including the risks, benefits and alternatives for the proposed anesthesia with the patient or authorized representative who has  indicated his/her understanding and acceptance.   Dental advisory given  Plan Discussed with: CRNA, Anesthesiologist and Surgeon  Anesthesia Plan Comments:        Anesthesia Quick Evaluation

## 2017-10-02 ENCOUNTER — Encounter (HOSPITAL_COMMUNITY): Payer: Self-pay | Admitting: Orthopedic Surgery

## 2017-10-02 LAB — BASIC METABOLIC PANEL
Anion gap: 13 (ref 5–15)
BUN: 16 mg/dL (ref 6–20)
CALCIUM: 8.5 mg/dL — AB (ref 8.9–10.3)
CHLORIDE: 105 mmol/L (ref 101–111)
CO2: 22 mmol/L (ref 22–32)
CREATININE: 0.85 mg/dL (ref 0.44–1.00)
GFR calc non Af Amer: >60 mL/min (ref >60)
Glucose, Bld: 189 mg/dL — ABNORMAL HIGH (ref 65–99)
Potassium: 3.8 mmol/L (ref 3.5–5.1)
SODIUM: 140 mmol/L (ref 135–145)

## 2017-10-02 LAB — CBC
HCT: 35.4 % — ABNORMAL LOW (ref 36.0–46.0)
HEMOGLOBIN: 11.5 g/dL — AB (ref 12.0–15.0)
MCH: 30.3 pg (ref 26.0–34.0)
MCHC: 32.5 g/dL (ref 30.0–36.0)
MCV: 93.4 fL (ref 78.0–100.0)
Platelets: 240 10*3/uL (ref 150–400)
RBC: 3.79 MIL/uL — ABNORMAL LOW (ref 3.87–5.11)
RDW: 14.1 % (ref 11.5–15.5)
WBC: 11.9 10*3/uL — ABNORMAL HIGH (ref 4.0–10.5)

## 2017-10-02 MED ORDER — TRAMADOL HCL 50 MG PO TABS: 50.0000 mg | ORAL_TABLET | Freq: Four times a day (QID) | ORAL | 0 refills | Status: DC | PRN

## 2017-10-02 MED ORDER — METHOCARBAMOL 500 MG PO TABS: 500.0000 mg | ORAL_TABLET | Freq: Four times a day (QID) | ORAL | 0 refills | Status: DC | PRN

## 2017-10-02 MED ORDER — RIVAROXABAN 10 MG PO TABS: 10.0000 mg | ORAL_TABLET | Freq: Every day | ORAL | 0 refills | Status: DC

## 2017-10-02 MED ORDER — TRAMADOL HCL 50 MG PO TABS: 50.0000 mg | ORAL_TABLET | Freq: Four times a day (QID) | ORAL | Status: DC | PRN

## 2017-10-02 MED ORDER — OXYCODONE HCL 5 MG PO TABS: 5.0000 mg | ORAL_TABLET | ORAL | 0 refills | Status: DC | PRN

## 2017-10-02 NOTE — Progress Notes (Signed)
Reviewed discharged to home with family.  Given all belongings, instructions, and equipment.  Discharge instructions reviewed with pt; verbalized understanding.  No questions at this time.

## 2017-10-02 NOTE — Evaluation (Signed)
Physical Therapy Evaluation Patient Details Name: Adrienne [name] MRN: [id number] DOB: 12/16/1945 Today's Date: 10/02/2017   History of Present Illness  [name]  Clinical Impression  The patient  Ambu;ated well. Plans DC after PT/ steps. Pt admitted with above diagnosis. Pt currently with functional limitations due to the deficits listed below (see PT Problem List).  Pt will benefit from skilled PT to increase their independence and safety with mobility to allow discharge to the venue listed below.       Follow Up Recommendations Home health PT    Equipment Recommendations  Rolling walker with 5" wheels;3in1 (PT)    Recommendations for Other Services       Precautions / Restrictions Precautions Precautions: Fall Restrictions LLE Weight Bearing: Weight bearing as tolerated      Mobility  Bed Mobility Overal bed mobility: Needs Assistance Bed Mobility: Supine to Sit     Supine to sit: Min guard        Transfers Overall transfer level: Needs assistance Equipment used: Rolling walker (2 wheeled) Transfers: Sit to/from Stand Sit to Stand: Min guard         General transfer comment: cues for hand and left leg position  Ambulation/Gait Ambulation/Gait assistance: Min assist Ambulation Distance (Feet): 100 Feet Assistive device: Rolling walker (2 wheeled) Gait Pattern/deviations: Step-to pattern;Step-through pattern     General Gait Details: cues for posture inside RW  Stairs            Wheelchair Mobility    Modified Rankin (Stroke Patients Only)       Balance                                             Pertinent Vitals/Pain Pain Assessment: 0-10 Pain Score: 2  Pain Descriptors / Indicators: Sore Pain Intervention(s): Monitored during session;Premedicated before session;Ice applied    Home Living Family/patient expects to be discharged to:: Private residence Living Arrangements: Children Available Help at Discharge:  Family Type of Home: House Home Access: Stairs to enter Entrance Stairs-Rails: None Technical brewer of Steps: 3 Home Layout: One level Home Equipment: Cane - single point      Prior Function Level of Independence: Independent               Hand Dominance        Extremity/Trunk Assessment   Upper Extremity Assessment Upper Extremity Assessment: Overall WFL for tasks assessed    Lower Extremity Assessment Lower Extremity Assessment: RLE deficits/detail RLE Deficits / Details: ABLE TO advance the left leg       Communication   Communication: No difficulties  Cognition Arousal/Alertness: Awake/alert Behavior During Therapy: WFL for tasks assessed/performed Overall Cognitive Status: Within Functional Limits for tasks assessed                                        General Comments      Exercises     Assessment/Plan    PT Assessment Patient needs continued PT services  PT Problem List Decreased strength;Decreased range of motion;Decreased activity tolerance;Decreased mobility;Decreased knowledge of precautions;Decreased safety awareness       PT Treatment Interventions DME instruction;Therapeutic exercise;Gait training;Stair training;Functional mobility training;Therapeutic activities;Patient/family education    PT Goals (Current goals can be found in the Care Plan section)  Acute Rehab PT Goals Patient Stated Goal: go home PT Goal Formulation: With patient Time For Goal Achievement: 10/04/17 Potential to Achieve Goals: Good    Frequency 7X/week   Barriers to discharge        Co-evaluation               AM-PAC PT "6 Clicks" Daily Activity  Outcome Measure Difficulty turning over in bed (including adjusting bedclothes, sheets and blankets)?: A Little Difficulty moving from lying on back to sitting on the side of the bed? : A Little Difficulty sitting down on and standing up from a chair with arms (e.g., wheelchair,  bedside commode, etc,.)?: A Little Help needed moving to and from a bed to chair (including a wheelchair)?: A Little Help needed walking in hospital room?: A Little Help needed climbing 3-5 steps with a railing? : A Little 6 Click Score: 18    End of Session   Activity Tolerance: Patient tolerated treatment well Patient left: in chair;with call bell/phone within reach Nurse Communication: Mobility status PT Visit Diagnosis: Unsteadiness on feet (R26.81)    Time: 9163-8466 PT Time Calculation (min) (ACUTE ONLY): 24 min   Charges:   PT Evaluation $PT Eval Low Complexity: 1 Low PT Treatments $Gait Training: 8-22 mins   PT G Codes:         Claretha Cooper 10/02/2017, 3:22 PM

## 2017-10-02 NOTE — Progress Notes (Signed)
   Subjective: 1 Day Post-Op Procedure(s) (LRB): LEFT  TOTAL HIP ARTHROPLASTY ANTERIOR APPROACH (Left) Patient reports pain as moderate.   Patient seen in rounds with Dr. [name]. Patient is well, and has had no acute complaints or problems We will start therapy today.  Plan is to go Home after hospital stay.  Objective: Vital signs in last 24 hours: Temp:  [97.5 F (36.4 C)-98.2 F (36.8 C)] 98.2 F (36.8 C) (04/11 0551) Pulse Rate:  [37-70] 55 (04/11 0551) Resp:  [11-20] 19 (04/11 0551) BP: (98-162)/(62-111) 111/72 (04/11 0551) SpO2:  [95 %-100 %] 100 % (04/11 0551) Weight:  [83.9 kg (185 lb)] 83.9 kg (185 lb) (04/10 1200)  Intake/Output from previous day:  Intake/Output Summary (Last 24 hours) at 10/02/2017 0900 Last data filed at 10/02/2017 0834 Gross per 24 hour  Intake 3536.25 ml  Output 2240 ml  Net 1296.25 ml    Intake/Output this shift: Total I/O In: 240 [P.O.:240] Out: -   Labs: Recent Labs    10/02/17 0535  HGB 11.5*   Recent Labs    10/02/17 0535  WBC 11.9*  RBC 3.79*  HCT 35.4*  PLT 240   Recent Labs    10/02/17 0535  NA 140  K 3.8  CL 105  CO2 22  BUN 16  CREATININE 0.85  GLUCOSE 189*  CALCIUM 8.5*   No results for input(s): LABPT, INR in the last 72 hours.  EXAM General - Patient is Alert, Appropriate and Oriented Extremity - Neurologically intact Neurovascular intact Sensation intact distally Intact pulses distally Dressing - dressing C/D/I and no drainage Motor Function - intact, moving foot and toes well on exam.  Hemovac pulled without difficulty.  Past Medical History:  Diagnosis Date  . Arthritis   . CHF (congestive heart failure) (HCC)    EF 45-50%  pt. denies. Dr. did further test and could not confirm per pt.  Marland Kitchen Dysrhythmia    hx of  . Headache    hx of migraines  . Hyperlipidemia   . Hypertension   . Hypothyroidism   . Knee pain   . Pelvic kidney   . Prediabetes    pt denies  . SBO (small bowel  obstruction) ([place])   . SBO (small bowel obstruction) ([place]) 10/2016  . Shoulder pain   . Sleep apnea    no mask needed per sleep study  . Smoker   . Thyroid disease     Assessment/Plan: 1 Day Post-Op Procedure(s) (LRB): LEFT  TOTAL HIP ARTHROPLASTY ANTERIOR APPROACH (Left) Principal Problem:   OA (osteoarthritis) of hip  Estimated body mass index is 32.77 kg/m as calculated from the following:   Height as of this encounter: 5\' 3"  (1.6 m).   Weight as of this encounter: 83.9 kg (185 lb). Advance diet Up with therapy Discharge home with home health  DVT Prophylaxis - Xarelto Weight Bearing As Tolerated left Leg Hemovac Pulled Begin Therapy  Arlee Muslim, PA-C Orthopaedic Surgery 10/02/2017, 9:00 AM

## 2017-10-02 NOTE — Progress Notes (Signed)
Physical Therapy Treatment Patient Details Name: Adrienne [name] MRN: [id number] DOB: [birth year] Today's Date: 10/02/2017    History of Present Illness Adrienne [name]    PT Comments    Ready for Dc    Follow Up Recommendations  Home health PT     Equipment Recommendations  Rolling walker with 5" wheels;3in1 (PT)    Recommendations for Other Services       Precautions / Restrictions Precautions Precautions: Fall Restrictions LLE Weight Bearing: Weight bearing as tolerated    Mobility  Bed Mobility Overal bed mobility: Needs Assistance Bed Mobility: Sit to Supine     Supine to sit: Min guard Sit to supine: Min guard   General bed mobility comments: self assisted the left leg  Transfers Overall transfer level: Needs assistance Equipment used: Rolling walker (2 wheeled) Transfers: Sit to/from Stand Sit to Stand: Min guard         General transfer comment: cues for hand and left leg position  Ambulation/Gait Ambulation/Gait assistance: Min guard Ambulation Distance (Feet): 100 Feet Assistive device: Rolling walker (2 wheeled) Gait Pattern/deviations: Step-to pattern;Step-through pattern     General Gait Details: cues for posture inside RW   Stairs Stairs: Yes   Stair Management: No rails;Step to pattern;Forwards;With cane Number of Stairs: 2 General stair comments: 1 HHA snd cane  Wheelchair Mobility    Modified Rankin (Stroke Patients Only)       Balance                                            Cognition Arousal/Alertness: Awake/alert Behavior During Therapy: WFL for tasks assessed/performed Overall Cognitive Status: Within Functional Limits for tasks assessed                                        Exercises Total Joint Exercises Ankle Circles/Pumps: AROM;Both;10 reps Quad Sets: AROM;Both;10 reps Short Arc Quad: AROM;Left;10 reps Heel Slides: AAROM;Left;10 reps Hip ABduction/ADduction: AAROM;Left;10  reps    General Comments        Pertinent Vitals/Pain Pain Assessment: 0-10 Pain Score: 2  Pain Descriptors / Indicators: Tightness Pain Intervention(s): Monitored during session;Ice applied    Home Living Family/patient expects to be discharged to:: Private residence Living Arrangements: Children Available Help at Discharge: Family Type of Home: House Home Access: Stairs to enter Entrance Stairs-Rails: None Home Layout: One level Home Equipment: Cane - single point      Prior Function Level of Independence: Independent          PT Goals (current goals can now be found in the care plan section) Acute Rehab PT Goals Patient Stated Goal: go home PT Goal Formulation: With patient Time For Goal Achievement: 10/04/17 Potential to Achieve Goals: Good Progress towards PT goals: Progressing toward goals    Frequency    7X/week      PT Plan Current plan remains appropriate    Co-evaluation              AM-PAC PT "6 Clicks" Daily Activity  Outcome Measure  Difficulty turning over in bed (including adjusting bedclothes, sheets and blankets)?: A Little Difficulty moving from lying on back to sitting on the side of the bed? : A Little Difficulty sitting down on and standing up from a chair with arms (e.g.,  wheelchair, bedside commode, etc,.)?: A Little Help needed moving to and from a bed to chair (including a wheelchair)?: A Little Help needed walking in hospital room?: A Little Help needed climbing 3-5 steps with a railing? : A Little 6 Click Score: 18    End of Session   Activity Tolerance: Patient tolerated treatment well Patient left: in bed;with call bell/phone within reach;with bed alarm set Nurse Communication: Mobility status PT Visit Diagnosis: Unsteadiness on feet (R26.81)     Time: 8677-3736 PT Time Calculation (min) (ACUTE ONLY): 21 min  Charges:  $Gait Training: 8-22 mins                    G CodesTresa [name] PT [phone]   Adrienne [name] 10/02/2017, 3:26 PM

## 2017-10-02 NOTE — Discharge Summary (Signed)
Physician Discharge Summary   Patient ID: Adrienne [name] MRN: [id number] DOB/AGE: 1945/12/25 72 y.o.  Admit date: 10/01/2017 Discharge date: 10/02/2017  Primary Diagnosis:  Osteoarthritis of the Left hip.   Admission Diagnoses:  Past Medical History:  Diagnosis Date  . Arthritis   . CHF (congestive heart failure) (HCC)    EF 45-50%  pt. denies. Dr. did further test and could not confirm per pt.  Marland Kitchen Dysrhythmia    hx of  . Headache    hx of migraines  . Hyperlipidemia   . Hypertension   . Hypothyroidism   . Knee pain   . Pelvic kidney   . Prediabetes    pt denies  . SBO (small bowel obstruction) ([place])   . SBO (small bowel obstruction) ([place]) 10/2016  . Shoulder pain   . Sleep apnea    no mask needed per sleep study  . Smoker   . Thyroid disease    Discharge Diagnoses:   Principal Problem:   OA (osteoarthritis) of hip  Estimated body mass index is 32.77 kg/m as calculated from the following:   Height as of this encounter: '5\' 3"'  (1.6 m).   Weight as of this encounter: 83.9 kg (185 lb).  Procedure(s) (LRB): LEFT  TOTAL HIP ARTHROPLASTY ANTERIOR APPROACH (Left)   Consults: None  HPI: Adrienne [name] is a 72 y.o. female who has advanced end-  stage arthritis of their Left  hip with progressively worsening pain and  dysfunction.The patient has failed nonoperative management and presents for  total hip arthroplasty.    Laboratory Data: Admission on 10/01/2017, Discharged on 10/02/2017  Component Date Value Ref Range Status  . WBC 10/02/2017 11.9* 4.0 - 10.5 K/uL Final  . RBC 10/02/2017 3.79* 3.87 - 5.11 MIL/uL Final  . Hemoglobin 10/02/2017 11.5* 12.0 - 15.0 g/dL Final  . HCT 10/02/2017 35.4* 36.0 - 46.0 % Final  . MCV 10/02/2017 93.4  78.0 - 100.0 fL Final  . MCH 10/02/2017 30.3  26.0 - 34.0 pg Final  . MCHC 10/02/2017 32.5  30.0 - 36.0 g/dL Final  . RDW 10/02/2017 14.1  11.5 - 15.5 % Final  . Platelets 10/02/2017 240  150 - 400 K/uL Final   Performed at  [facility], [place] [address], [place], [place] [address]  . Sodium 10/02/2017 140  135 - 145 mmol/L Final  . Potassium 10/02/2017 3.8  3.5 - 5.1 mmol/L Final  . Chloride 10/02/2017 105  101 - 111 mmol/L Final  . CO2 10/02/2017 22  22 - 32 mmol/L Final  . Glucose, Bld 10/02/2017 189* 65 - 99 mg/dL Final  . BUN 10/02/2017 16  6 - 20 mg/dL Final  . Creatinine, Ser 10/02/2017 0.85  0.44 - 1.00 mg/dL Final  . Calcium 10/02/2017 8.5* 8.9 - 10.3 mg/dL Final  . GFR calc non Af Amer 10/02/2017 >60  >60 mL/min Final  . GFR calc Af Amer 10/02/2017 >60  >60 mL/min Final   Comment: (NOTE) The eGFR has been calculated using the CKD EPI equation. This calculation has not been validated in all clinical situations. eGFR's persistently <60 mL/min signify possible Chronic Kidney Disease.   Georgiann Hahn gap 10/02/2017 13  5 - 15 Final   Performed at [facility], [place] [address], [place],  [address]  Hospital Outpatient Visit on 09/26/2017  Component Date Value Ref Range Status  . aPTT 09/26/2017 27  24 - 36 seconds Final   Performed at [facility], [place] [address], [place], [place] [address]  . WBC 09/26/2017 9.5  4.0 - 10.5 K/uL Final  . RBC 09/26/2017 4.23  3.87 - 5.11 MIL/uL Final  . Hemoglobin 09/26/2017 13.2  12.0 - 15.0 g/dL Final  . HCT 09/26/2017 39.2  36.0 - 46.0 % Final  . MCV 09/26/2017 92.7  78.0 - 100.0 fL Final  . MCH 09/26/2017 31.2  26.0 - 34.0 pg Final  . MCHC 09/26/2017 33.7  30.0 - 36.0 g/dL Final  . RDW 09/26/2017 13.9  11.5 - 15.5 % Final  . Platelets 09/26/2017 280  150 - 400 K/uL Final   Performed at [facility], [place] [address] [place], [place] [address]  . Sodium 09/26/2017 141  135 - 145 mmol/L Final  . Potassium 09/26/2017 4.2  3.5 - 5.1 mmol/L Final  . Chloride 09/26/2017 105  101 - 111 mmol/L Final  . CO2 09/26/2017 27  22 - 32 mmol/L Final  . Glucose, Bld 09/26/2017 97  65 - 99 mg/dL Final    . BUN 09/26/2017 14  6 - 20 mg/dL Final  . Creatinine, Ser 09/26/2017 0.91  0.44 - 1.00 mg/dL Final  . Calcium 09/26/2017 9.5  8.9 - 10.3 mg/dL Final  . Total Protein 09/26/2017 8.1  6.5 - 8.1 g/dL Final  . Albumin 09/26/2017 3.7  3.5 - 5.0 g/dL Final  . AST 09/26/2017 24  15 - 41 U/L Final  . ALT 09/26/2017 16  14 - 54 U/L Final  . Alkaline Phosphatase 09/26/2017 68  38 - 126 U/L Final  . Total Bilirubin 09/26/2017 0.9  0.3 - 1.2 mg/dL Final  . GFR calc non Af Amer 09/26/2017 >60  >60 mL/min Final  . GFR calc Af Amer 09/26/2017 >60  >60 mL/min Final   Comment: (NOTE) The eGFR has been calculated using the CKD EPI equation. This calculation has not been validated in all clinical situations. eGFR's persistently <60 mL/min signify possible Chronic Kidney Disease.   Georgiann Hahn gap 09/26/2017 9  5 - 15 Final   Performed at [facility], [place] [address], [place], [place] [address]  . Prothrombin Time 09/26/2017 12.5  11.4 - 15.2 seconds Final  . INR 09/26/2017 0.94   Final   Performed at [facility], [place] [address], [place], [place] [address]  . ABO/RH(D) 09/26/2017 B POS   Final  . Antibody Screen 09/26/2017 NEG   Final  . Sample Expiration 09/26/2017 10/04/2017   Final  . Extend sample reason 09/26/2017    Final                   Value:NO TRANSFUSIONS OR PREGNANCY IN THE PAST 3 MONTHS Performed at [facility], [place] [address], [place], [place] [address]   . MRSA, PCR 09/26/2017 INVALID RESULTS, SPECIMEN SENT FOR CULTURE* NEGATIVE Final  . Staphylococcus aureus 09/26/2017 INVALID RESULTS, SPECIMEN SENT FOR CULTURE* NEGATIVE Final   Comment: (NOTE) The Xpert SA Assay (FDA approved for NASAL specimens in patients 79 years of age and older), is one component of a comprehensive surveillance program. It is not intended to diagnose infection nor to guide or monitor treatment. Performed at [facility], [place]  [address] [place], [place] [address]   . Hgb A1c MFr Bld 09/26/2017 5.8* 4.8 - 5.6 % Final   Comment: (NOTE) Pre diabetes:          5.7%-6.4% Diabetes:              >  6.4% Glycemic control for   <7.0% adults with diabetes   . Mean Plasma Glucose 09/26/2017 119.76  mg/dL Final   Performed at [place] [address], [place], [place] [address]  . Specimen Description 09/26/2017    Final                   Value:NOSE Performed at [facility], [place] [address] [place], [place] [address]   . Special Requests 09/26/2017    Final                   Value:NONE Performed at [facility], [place] [address], [place], [place] [address]   . Culture 09/26/2017    Final                   Value:NO MRSA DETECTED Performed at [facility], [place] [address] [place], [place] [address]   . Report Status 09/26/2017 09/27/2017 FINAL   Final  . ABO/RH(D) 09/26/2017    Final                   Value:B POS Performed at [facility], [place] [address], [place],  [address]      X-Rays:Dg Pelvis Portable  Result Date: 10/01/2017 CLINICAL DATA:  Left hip replacement EXAM: PORTABLE PELVIS 1-2 VIEWS COMPARISON:  10/01/2017 FINDINGS: Changes of total left hip replacement. No hardware bony complicating feature. Normal AP alignment. Advanced degenerative changes in the right hip. IMPRESSION: Left hip replacement.  No visible complicating feature. Electronically Signed   By: Rolm Baptise M.D.   On: 10/01/2017 16:31   Dg C-arm 1-60 Min-no Report  Result Date: 10/01/2017 Fluoroscopy was utilized by the requesting physician.  No radiographic interpretation.    EKG: Orders placed or performed in visit on 08/20/17  . EKG 12-Lead     Hospital Course: Patient was admitted to [facility] and taken to the OR and underwent the above state procedure without complications.  Patient tolerated the procedure well and was later transferred to the  recovery room and then to the orthopaedic floor for postoperative care.  They were given PO and IV analgesics for pain control following their surgery.  They were given 24 hours of postoperative antibiotics of  Anti-infectives (From admission, onward)   Start     Dose/Rate Route Frequency Ordered Stop   10/01/17 2000  ceFAZolin (ANCEF) IVPB 2g/100 mL premix     2 g 200 mL/hr over 30 Minutes Intravenous Every 6 hours 10/01/17 1824 10/02/17 0320   10/01/17 1215  ceFAZolin (ANCEF) IVPB 2g/100 mL premix     2 g 200 mL/hr over 30 Minutes Intravenous On call to O.R. 10/01/17 1201 10/01/17 1408     and started on DVT prophylaxis in the form of Xarelto.   PT and OT were ordered for total hip protocol.  The patient was allowed to be WBAT with therapy. Discharge planning was consulted to help with postop disposition and equipment needs.  Patient had a decent night on the evening of surgery.  They started to get up OOB with therapy on day one.  Hemovac drain was pulled without difficulty.  Dressing was checked and looked okay. Patient was seen in rounds on day one, did well with therapy and was ready to go home.  Diet: Cardiac diet and Diabetic diet Activity:WBAT Follow-up:in 2 weeks Disposition - Home Discharged Condition: stable   Discharge Instructions  Call MD / Call 911   Complete by:  As directed    If you experience chest pain or shortness of breath, CALL 911 and be transported to the hospital emergency room.  If you develope a fever above 101 F, pus (white drainage) or increased drainage or redness at the wound, or calf pain, call your surgeon's office.   Change dressing   Complete by:  As directed    You may change your dressing dressing daily with sterile 4 x 4 inch gauze dressing and paper tape.  Do not submerge the incision under water.   Constipation Prevention   Complete by:  As directed    Drink plenty of fluids.  Prune juice may be helpful.  You may use a stool softener, such as  Colace (over the counter) 100 mg twice a day.  Use MiraLax (over the counter) for constipation as needed.   Diet - low sodium heart healthy   Complete by:  As directed    Discharge instructions   Complete by:  As directed    Take Xarelto for two and a half more weeks, then discontinue Xarelto. Once the patient has completed the blood thinner regimen, then take a Baby 81 mg Aspirin daily for three more weeks.  Pick up stool softner and laxative for home use following surgery while on pain medications. Do not submerge incision under water. Please use good hand washing techniques while changing dressing each day. May shower starting three days after surgery. Please use a clean towel to pat the incision dry following showers. Continue to use ice for pain and swelling after surgery. Do not use any lotions or creams on the incision until instructed by your surgeon.  Wear both TED hose on both legs during the day every day for three weeks, but may remove the TED hose at night at home.  Postoperative Constipation Protocol  Constipation - defined medically as fewer than three stools per week and severe constipation as less than one stool per week.  One of the most common issues patients have following surgery is constipation.  Even if you have a regular bowel pattern at home, your normal regimen is likely to be disrupted due to multiple reasons following surgery.  Combination of anesthesia, postoperative narcotics, change in appetite and fluid intake all can affect your bowels.  In order to avoid complications following surgery, here are some recommendations in order to help you during your recovery period.  Colace (docusate) - Pick up an over-the-counter form of Colace or another stool softener and take twice a day as long as you are requiring postoperative pain medications.  Take with a full glass of water daily.  If you experience loose stools or diarrhea, hold the colace until you stool forms back  up.  If your symptoms do not get better within 1 week or if they get worse, check with your doctor.  Dulcolax (bisacodyl) - Pick up over-the-counter and take as directed by the product packaging as needed to assist with the movement of your bowels.  Take with a full glass of water.  Use this product as needed if not relieved by Colace only.   MiraLax (polyethylene glycol) - Pick up over-the-counter to have on hand.  MiraLax is a solution that will increase the amount of water in your bowels to assist with bowel movements.  Take as directed and can mix with a glass of water, juice, soda, coffee, or tea.  Take if you go more than  two days without a movement. Do not use MiraLax more than once per day. Call your doctor if you are still constipated or irregular after using this medication for 7 days in a row.  If you continue to have problems with postoperative constipation, please contact the office for further assistance and recommendations.  If you experience "the worst abdominal pain ever" or develop nausea or vomiting, please contact the office immediatly for further recommendations for treatment.   Do not sit on low chairs, stoools or toilet seats, as it may be difficult to get up from low surfaces   Complete by:  As directed    Driving restrictions   Complete by:  As directed    No driving until released by the physician.   Increase activity slowly as tolerated   Complete by:  As directed    Lifting restrictions   Complete by:  As directed    No lifting until released by the physician.   Patient may shower   Complete by:  As directed    You may shower without a dressing once there is no drainage.  Do not wash over the wound.  If drainage remains, do not shower until drainage stops.   TED hose   Complete by:  As directed    Use stockings (TED hose) for 3 weeks on both leg(s).  You may remove them at night for sleeping.   Weight bearing as tolerated   Complete by:  As directed       Allergies as of 10/02/2017      Reactions   Lisinopril Swelling   Angioedema   Atorvastatin Other (See Comments)   Muscle aches       Medication List    STOP taking these medications   FISH OIL PO   multivitamin with minerals tablet     TAKE these medications   amLODipine 10 MG tablet Commonly known as:  NORVASC TAKE ONE TABLET BY MOUTH ONCE DAILY What changed:    how much to take  how to take this  when to take this   carvedilol 3.125 MG tablet Commonly known as:  COREG TAKE ONE TABLET BY MOUTH TWICE DAILY WITH MEALS   hydrochlorothiazide 25 MG tablet Commonly known as:  HYDRODIURIL Take 1 tablet (25 mg total) daily by mouth. What changed:  when to take this   levothyroxine 75 MCG tablet Commonly known as:  SYNTHROID, LEVOTHROID TAKE 1 TABLET BY MOUTH ONCE DAILY   LUBRICATING EYE DROPS OP Place 1 drop into both eyes daily as needed (redness).   methocarbamol 500 MG tablet Commonly known as:  ROBAXIN Take 1 tablet (500 mg total) by mouth every 6 (six) hours as needed for muscle spasms.   oxyCODONE 5 MG immediate release tablet Commonly known as:  Oxy IR/ROXICODONE Take 1-2 tablets (5-10 mg total) by mouth every 4 (four) hours as needed for moderate pain or severe pain.   rivaroxaban 10 MG Tabs tablet Commonly known as:  XARELTO Take 1 tablet (10 mg total) by mouth daily with breakfast. Take Xarelto for two and a half more weeks following discharge from the hospital, then discontinue Xarelto. Once the patient has completed the blood thinner regimen, then take a Baby 81 mg Aspirin daily for three more weeks.   traMADol 50 MG tablet Commonly known as:  ULTRAM Take 1-2 tablets (50-100 mg total) by mouth every 6 (six) hours as needed (for mild pain). What changed:    how much to take  when to take this  reasons to take this            Discharge Care Instructions  (From admission, onward)        Start     Ordered   10/02/17 0000  Weight  bearing as tolerated     10/02/17 0910   10/02/17 0000  Change dressing    Comments:  You may change your dressing dressing daily with sterile 4 x 4 inch gauze dressing and paper tape.  Do not submerge the incision under water.   10/02/17 0910     Follow-up Information    [name], MD Follow up on 10/14/2017.   Specialty:  Orthopedic Surgery Contact information: [address] [place] [address] [phone]        [facility].   Specialty:  Home Health Services Why:  physical therapy Contact information: [address] [place] [address] [place] [address] [place] Follow up.   Why:  walker and 3n1 Contact information: [address] [place] [address] [phone]           Signed: Arlee Muslim, PA-C Orthopaedic Surgery 10/14/2017, 6:05 PM

## 2017-10-02 NOTE — Progress Notes (Signed)
Discharge planning, spoke with patient and spouse at bedside. Have chosen [facility] for HH PT, evaluate and treat. Contacted [facility] for referral. Needs RW and 3n1, contacted [facility] to deliver to room. [phone] 

## 2017-10-04 DIAGNOSIS — Z79891 Long term (current) use of opiate analgesic: Secondary | ICD-10-CM | POA: Diagnosis not present

## 2017-10-04 DIAGNOSIS — I110 Hypertensive heart disease with heart failure: Secondary | ICD-10-CM | POA: Diagnosis not present

## 2017-10-04 DIAGNOSIS — M170 Bilateral primary osteoarthritis of knee: Secondary | ICD-10-CM | POA: Diagnosis not present

## 2017-10-04 DIAGNOSIS — Z7901 Long term (current) use of anticoagulants: Secondary | ICD-10-CM | POA: Diagnosis not present

## 2017-10-04 DIAGNOSIS — Z87891 Personal history of nicotine dependence: Secondary | ICD-10-CM | POA: Diagnosis not present

## 2017-10-04 DIAGNOSIS — I5022 Chronic systolic (congestive) heart failure: Secondary | ICD-10-CM | POA: Diagnosis not present

## 2017-10-04 DIAGNOSIS — Z96653 Presence of artificial knee joint, bilateral: Secondary | ICD-10-CM | POA: Diagnosis not present

## 2017-10-04 DIAGNOSIS — Z9181 History of falling: Secondary | ICD-10-CM | POA: Diagnosis not present

## 2017-10-04 DIAGNOSIS — H409 Unspecified glaucoma: Secondary | ICD-10-CM | POA: Diagnosis not present

## 2017-10-04 DIAGNOSIS — Z471 Aftercare following joint replacement surgery: Secondary | ICD-10-CM | POA: Diagnosis not present

## 2017-10-13 DIAGNOSIS — Z87891 Personal history of nicotine dependence: Secondary | ICD-10-CM | POA: Diagnosis not present

## 2017-10-13 DIAGNOSIS — I110 Hypertensive heart disease with heart failure: Secondary | ICD-10-CM | POA: Diagnosis not present

## 2017-10-13 DIAGNOSIS — Z79891 Long term (current) use of opiate analgesic: Secondary | ICD-10-CM | POA: Diagnosis not present

## 2017-10-13 DIAGNOSIS — I5022 Chronic systolic (congestive) heart failure: Secondary | ICD-10-CM | POA: Diagnosis not present

## 2017-10-13 DIAGNOSIS — Z7901 Long term (current) use of anticoagulants: Secondary | ICD-10-CM | POA: Diagnosis not present

## 2017-10-13 DIAGNOSIS — Z9181 History of falling: Secondary | ICD-10-CM | POA: Diagnosis not present

## 2017-10-13 DIAGNOSIS — Z96653 Presence of artificial knee joint, bilateral: Secondary | ICD-10-CM | POA: Diagnosis not present

## 2017-10-13 DIAGNOSIS — H409 Unspecified glaucoma: Secondary | ICD-10-CM | POA: Diagnosis not present

## 2017-10-13 DIAGNOSIS — M170 Bilateral primary osteoarthritis of knee: Secondary | ICD-10-CM | POA: Diagnosis not present

## 2017-10-13 DIAGNOSIS — Z471 Aftercare following joint replacement surgery: Secondary | ICD-10-CM | POA: Diagnosis not present

## 2017-10-15 ENCOUNTER — Other Ambulatory Visit: Payer: Self-pay | Admitting: Physician Assistant

## 2017-10-15 ENCOUNTER — Encounter (HOSPITAL_COMMUNITY): Payer: Self-pay | Admitting: Orthopedic Surgery

## 2017-11-04 ENCOUNTER — Ambulatory Visit: Payer: PPO | Admitting: Cardiovascular Disease

## 2017-11-04 ENCOUNTER — Encounter

## 2017-11-04 DIAGNOSIS — Z96642 Presence of left artificial hip joint: Secondary | ICD-10-CM | POA: Diagnosis not present

## 2017-11-04 DIAGNOSIS — M1712 Unilateral primary osteoarthritis, left knee: Secondary | ICD-10-CM | POA: Diagnosis not present

## 2017-11-04 DIAGNOSIS — Z471 Aftercare following joint replacement surgery: Secondary | ICD-10-CM | POA: Diagnosis not present

## 2017-11-04 DIAGNOSIS — M1612 Unilateral primary osteoarthritis, left hip: Secondary | ICD-10-CM | POA: Diagnosis not present

## 2017-12-05 ENCOUNTER — Encounter: Payer: Self-pay | Admitting: Cardiovascular Disease

## 2017-12-05 ENCOUNTER — Ambulatory Visit: Payer: PPO | Admitting: Cardiovascular Disease

## 2017-12-05 VITALS — BP 112/70 | HR 59 | Ht 64.0 in | Wt 184.1 lb

## 2017-12-05 DIAGNOSIS — I10 Essential (primary) hypertension: Secondary | ICD-10-CM | POA: Diagnosis not present

## 2017-12-05 DIAGNOSIS — E785 Hyperlipidemia, unspecified: Secondary | ICD-10-CM | POA: Diagnosis not present

## 2017-12-05 DIAGNOSIS — I5022 Chronic systolic (congestive) heart failure: Secondary | ICD-10-CM | POA: Diagnosis not present

## 2017-12-05 NOTE — Patient Instructions (Signed)
Medication Instructions:  Your physician recommends that you continue on your current medications as directed. Please refer to the Current Medication list given to you today.   Labwork: None Ordered   Testing/Procedures: None Ordered   Follow-Up: Your physician wants you to follow-up in: 1 year with Dr. Nahser.  You will receive a reminder letter in the mail two months in advance. If you don't receive a letter, please call our office to schedule the follow-up appointment.   If you need a refill on your cardiac medications before your next appointment, please call your pharmacy.   Thank you for choosing [facility]! Christne Platts, RN [phone]    

## 2017-12-05 NOTE — Progress Notes (Signed)
Cardiology Office Note   Date:  12/05/2017   ID:  Adrienne [name], Adrienne [name] Apr 22, 1946, MRN [id number]  PCP:  Orlena Sheldon, PA-C  Cardiologist:   Mertie Moores, MD   Chief Complaint  Patient presents with  . Congestive Heart Failure    Problem list: 1. Chronic systolic congestive heart failure-EF of around 44% in 2010.  Her EF has now normalized.  2. Hypertension 3. Dyslipidemia   Adrienne [name] is a middle-age female with a history of hypertension, dyslipidemia, palpitations. She also has a history of chest pain. A nuclear stress test from March of 2010 was negative for ischemia.  She has not had episodes of chest pain or shortness of breath.  Jan. 5, 2015:  Adrienne [name] is seen today after a ~ 3 year absence. She retired last year and tried to get life insurance. She was told that she has "CHF" . Her echo and myoview from 2010 showed mildly reduced EF . The echo showed EF of 45-50%. Myoview EF was 41%.   Her medication doses are slightly lower than when we last saw her 3 years ago. She has gradually reduced her dose because of chronic headaches. She has been seeing a Karis Juba, NP at [facility].  She still smokes - 1/2 ppd currently. She avoids salt. Does not eat out much.   December 31, 2013:  Adrienne [name] is doing ok. BP is much better than previous visit. BP at her medical doctors office . She stopped taking the HCTZ and potassium when she ran out of her prescription. Her blood pressure is better despite the fact that she's not on those medications, . She is planning on moving to [facility] within the next several months   August 25, 2014:    Adrienne [name] is a 72 y.o. female who presents for follow-up of her history of congestive heart failure, hyperlipidemia, and hypertension.  She is staying busy. No CP or dyspnea.  Takes all of her meds. No CP or dyspnea. Does not exercise   still smoking ,  Advised her to stop again   September 12, 2016:  No CP , no  dyspnea.   Still smoking  - advised her to stop  Having trouble with her knees   December 05, 2017:  Adrienne [name] is seen today today for follow-up of her chronic systolic congestive heart failure, hypertension and hyperlipidemia. Quit smoking last year  Did not start the Rosuvastatin  Had hip replacement    Past Medical History:  Diagnosis Date  . Arthritis   . CHF (congestive heart failure) (HCC)    EF 45-50%  pt. denies. Dr. did further test and could not confirm per pt.  Marland Kitchen Dysrhythmia    hx of  . Headache    hx of migraines  . Hyperlipidemia   . Hypertension   . Hypothyroidism   . Knee pain   . Pelvic kidney   . Prediabetes    pt denies  . SBO (small bowel obstruction) ([place])   . SBO (small bowel obstruction) ([place]) 10/2016  . Shoulder pain   . Sleep apnea    no mask needed per sleep study  . Smoker   . Thyroid disease     Past Surgical History:  Procedure Laterality Date  . ABDOMINAL HYSTERECTOMY  1986  . BACK SURGERY    . CHOLECYSTECTOMY    . ECTOPIC PREGNANCY SURGERY    . JOINT REPLACEMENT     Left hip total Dr. [name]  10-01-17  . polypectomy-oropharynx    . right knee meniscus    . TOTAL HIP ARTHROPLASTY Left 10/01/2017   Procedure: LEFT  TOTAL HIP ARTHROPLASTY ANTERIOR APPROACH;  Surgeon: [name], MD;  Location: [facility];  Service: Orthopedics;  Laterality: Left;     Current Outpatient Medications  Medication Sig Dispense Refill  . amLODipine (NORVASC) 10 MG tablet TAKE ONE TABLET BY MOUTH ONCE DAILY (Patient taking differently: TAKE ONE TABLET BY MOUTH ONCE DAILY IN THE AFTERNOON) 90 tablet 2  . Carboxymethylcellul-Glycerin (LUBRICATING EYE DROPS OP) Place 1 drop into both eyes daily as needed (redness).    . carvedilol (COREG) 3.125 MG tablet Take 3.125 mg by mouth daily.    . hydrochlorothiazide (HYDRODIURIL) 25 MG tablet Take 1 tablet (25 mg total) daily by mouth. (Patient taking differently: Take 25 mg by mouth daily at 2 PM. ) 90 tablet 3  . levothyroxine  (SYNTHROID, LEVOTHROID) 75 MCG tablet TAKE 1 TABLET BY MOUTH ONCE DAILY 90 tablet 1   No current facility-administered medications for this visit.     Allergies:   Lisinopril and Atorvastatin    Social History:  The patient  reports that she quit smoking about 12 months ago. Her smoking use included e-cigarettes. She has a 53.00 pack-year smoking history. She has never used smokeless tobacco. She reports that she does not drink alcohol or use drugs.   Family History:  The patient's family history includes Breast cancer in her sister; Cancer in her sister; Diabetes in her brother and brother; Hypertension in her other.    ROS: Noted in current history, otherwise review of systems is negative.   Physical Exam: Blood pressure 112/70, pulse (!) 59, height 5\' 4"  (1.626 m), weight 184 lb 1.9 oz (83.5 kg), SpO2 95 %.  GEN:   Mildly obese, elderly female  HEENT: Normal NECK: No JVD; No carotid bruits LYMPHATICS: No lymphadenopathy CARDIAC: RRR RESPIRATORY:  Clear to auscultation without rales, wheezing or rhonchi  ABDOMEN: Soft, non-tender, non-distended MUSCULOSKELETAL:  No edema; No deformity  SKIN: Warm and dry NEUROLOGIC:  Alert and oriented x 3   EKG:     Recent Labs: 06/05/2017: TSH 1.85 09/26/2017: ALT 16 10/02/2017: BUN 16; Creatinine, Ser 0.85; Hemoglobin 11.5; Platelets 240; Potassium 3.8; Sodium 140    Lipid Panel    Component Value Date/Time   CHOL 311 (H) 06/05/2017 1032   TRIG 166 (H) 06/05/2017 1032   HDL 71 06/05/2017 1032   CHOLHDL 4.4 06/05/2017 1032   VLDL 20 04/08/2016 0933   LDLCALC 207 (H) 06/05/2017 1032      Wt Readings from Last 3 Encounters:  12/05/17 184 lb 1.9 oz (83.5 kg)  10/01/17 185 lb (83.9 kg)  09/26/17 185 lb (83.9 kg)      Other studies Reviewed: Additional studies/ records that were reviewed today include: . Review of the above records demonstrates:    ASSESSMENT AND PLAN: 1. Chronic systolic congestive heart failure-  Hx of  CHF   - EF is now normal.    To new current medications.  2. Hypertension -    blood pressure is now well controlled.  3. Dyslipidemia -managed by her primary medical doctor.  She does not want to start a statin medication.  We will leave the final decision up to her and her primary medical doctor.   Current medicines are reviewed at length with the patient today.  The patient does not have concerns regarding medicines.  The following changes have been made:  no change   Disposition:   FU with me in 1 year.     Signed, Mertie Moores, MD  12/05/2017 9:46 AM    [facility] [place], [place], [place]  [address] Phone: [phone]; Fax: [phone]

## 2017-12-23 ENCOUNTER — Other Ambulatory Visit: Payer: Self-pay | Admitting: Physician Assistant

## 2017-12-23 DIAGNOSIS — Z1231 Encounter for screening mammogram for malignant neoplasm of breast: Secondary | ICD-10-CM

## 2018-01-06 ENCOUNTER — Ambulatory Visit (INDEPENDENT_AMBULATORY_CARE_PROVIDER_SITE_OTHER): Payer: PPO | Admitting: Family Medicine

## 2018-01-06 ENCOUNTER — Encounter: Payer: Self-pay | Admitting: Family Medicine

## 2018-01-06 VITALS — BP 118/68 | HR 58 | Temp 98.2°F | Resp 14 | Ht 63.0 in | Wt 186.0 lb

## 2018-01-06 DIAGNOSIS — R7309 Other abnormal glucose: Secondary | ICD-10-CM | POA: Diagnosis not present

## 2018-01-06 DIAGNOSIS — E039 Hypothyroidism, unspecified: Secondary | ICD-10-CM | POA: Diagnosis not present

## 2018-01-06 DIAGNOSIS — Z09 Encounter for follow-up examination after completed treatment for conditions other than malignant neoplasm: Secondary | ICD-10-CM | POA: Diagnosis not present

## 2018-01-06 DIAGNOSIS — I10 Essential (primary) hypertension: Secondary | ICD-10-CM

## 2018-01-06 DIAGNOSIS — E785 Hyperlipidemia, unspecified: Secondary | ICD-10-CM | POA: Diagnosis not present

## 2018-01-06 DIAGNOSIS — Z1211 Encounter for screening for malignant neoplasm of colon: Secondary | ICD-10-CM

## 2018-01-06 DIAGNOSIS — I5022 Chronic systolic (congestive) heart failure: Secondary | ICD-10-CM

## 2018-01-06 NOTE — Progress Notes (Signed)
Subjective:    Patient ID: Adrienne [name], female    DOB: Jan 22, 1946, 72 y.o.   MRN: [id number]  HPI Recently underwent left hip replacement.  I have copied relevant portions of the discharge summary and included them below for my reference:  Admit date: 10/01/2017 Discharge date: 10/02/2017  Primary Diagnosis:  Osteoarthritis of theLefthip.   Admission Diagnoses:      Past Medical History:  Diagnosis Date  . Arthritis   . CHF (congestive heart failure) (HCC)    EF 45-50%  pt. denies. Dr. did further test and could not confirm per pt.  Marland Kitchen Dysrhythmia    hx of  . Headache    hx of migraines  . Hyperlipidemia   . Hypertension   . Hypothyroidism   . Knee pain   . Pelvic kidney   . Prediabetes    pt denies  . SBO (small bowel obstruction) ([place])   . SBO (small bowel obstruction) ([place]) 10/2016  . Shoulder pain   . Sleep apnea    no mask needed per sleep study  . Smoker   . Thyroid disease    Discharge Diagnoses:   Principal Problem:   OA (osteoarthritis) of hip  Estimated body mass index is 32.77 kg/m as calculated from the following:   Height as of this encounter: '5\' 3"'  (1.6 m).   Weight as of this encounter: 83.9 kg (185 lb).  Procedure(s) (LRB): LEFT  TOTAL HIP ARTHROPLASTY ANTERIOR APPROACH (Left)   Consults: None  HPI: Adrienne Espericueta Hairstonis a 72 y.o.femalewho has advanced end-  stage arthritis of theirLefthip with progressively worsening pain and  dysfunction.The patient has failed nonoperative management and presents for  total hip arthroplasty.    Laboratory Data:         Admission on 10/01/2017, Discharged on 10/02/2017  Component Date Value Ref Range Status   . WBC 10/02/2017 11.9* 4.0 - 10.5 K/uL Final  . RBC 10/02/2017 3.79* 3.87 - 5.11 MIL/uL Final  . Hemoglobin 10/02/2017 11.5* 12.0 - 15.0 g/dL Final  . HCT 10/02/2017 35.4* 36.0 - 46.0 % Final  . MCV 10/02/2017 93.4  78.0 - 100.0 fL Final  . MCH 10/02/2017  30.3  26.0 - 34.0 pg Final  . MCHC 10/02/2017 32.5  30.0 - 36.0 g/dL Final  . RDW 10/02/2017 14.1  11.5 - 15.5 % Final  . Platelets 10/02/2017 240  150 - 400 K/uL Final   Performed at [facility], [place] [address], [place], [place] [address]  . Sodium 10/02/2017 140  135 - 145 mmol/L Final  . Potassium 10/02/2017 3.8  3.5 - 5.1 mmol/L Final  . Chloride 10/02/2017 105  101 - 111 mmol/L Final  . CO2 10/02/2017 22  22 - 32 mmol/L Final  . Glucose, Bld 10/02/2017 189* 65 - 99 mg/dL Final  . BUN 10/02/2017 16  6 - 20 mg/dL Final  . Creatinine, Ser 10/02/2017 0.85  0.44 - 1.00 mg/dL Final  . Calcium 10/02/2017 8.5* 8.9 - 10.3 mg/dL Final  . GFR calc non Af Amer 10/02/2017 >60  >60 mL/min Final  . GFR calc Af Amer 10/02/2017 >60  >60 mL/min Final   Comment: (NOTE) The eGFR has been calculated using the CKD EPI equation. This calculation has not been validated in all clinical situations. eGFR's persistently <60 mL/min signify possible Chronic Kidney Disease.   Georgiann Hahn gap 10/02/2017 13  5 - 15 Final   Performed at [facility], [place]  [address], [place] [address]  Hospital Outpatient Visit on 09/26/2017  Component Date Value Ref Range Status   . aPTT 09/26/2017 27  24 - 36 seconds Final   Performed at [facility], [place] [address], [place], [place] [address]  . WBC 09/26/2017 9.5  4.0 - 10.5 K/uL Final  . RBC 09/26/2017 4.23  3.87 - 5.11 MIL/uL Final  . Hemoglobin 09/26/2017 13.2  12.0 - 15.0 g/dL Final  . HCT 09/26/2017 39.2  36.0 - 46.0 % Final  . MCV 09/26/2017 92.7  78.0 - 100.0 fL Final  . MCH 09/26/2017 31.2  26.0 - 34.0 pg Final  . MCHC 09/26/2017 33.7  30.0 - 36.0 g/dL Final  . RDW 09/26/2017 13.9  11.5 - 15.5 % Final  . Platelets 09/26/2017 280  150 - 400 K/uL Final   Performed at [facility], [place] [address] [place], [place] [address]  . Sodium 09/26/2017 141  135 - 145 mmol/L Final  .  Potassium 09/26/2017 4.2  3.5 - 5.1 mmol/L Final  . Chloride 09/26/2017 105  101 - 111 mmol/L Final  . CO2 09/26/2017 27  22 - 32 mmol/L Final  . Glucose, Bld 09/26/2017 97  65 - 99 mg/dL Final  . BUN 09/26/2017 14  6 - 20 mg/dL Final  . Creatinine, Ser 09/26/2017 0.91  0.44 - 1.00 mg/dL Final  . Calcium 09/26/2017 9.5  8.9 - 10.3 mg/dL Final  . Total Protein 09/26/2017 8.1  6.5 - 8.1 g/dL Final  . Albumin 09/26/2017 3.7  3.5 - 5.0 g/dL Final  . AST 09/26/2017 24  15 - 41 U/L Final  . ALT 09/26/2017 16  14 - 54 U/L Final  . Alkaline Phosphatase 09/26/2017 68  38 - 126 U/L Final  . Total Bilirubin 09/26/2017 0.9  0.3 - 1.2 mg/dL Final  . GFR calc non Af Amer 09/26/2017 >60  >60 mL/min Final  . GFR calc Af Amer 09/26/2017 >60  >60 mL/min Final   Comment: (NOTE) The eGFR has been calculated using the CKD EPI equation. This calculation has not been validated in all clinical situations. eGFR's persistently <60 mL/min signify possible Chronic Kidney Disease.   Georgiann Hahn gap 09/26/2017 9  5 - 15 Final   Performed at [facility], [place] [address] [place], [place] [address]  . Prothrombin Time 09/26/2017 12.5  11.4 - 15.2 seconds Final  . INR 09/26/2017 0.94   Final   Performed at [facility], [place] [address], [place], [place] [address]  . ABO/RH(D) 09/26/2017 B POS   Final  . Antibody Screen 09/26/2017 NEG   Final  . Sample Expiration 09/26/2017 10/04/2017   Final  . Extend sample reason 09/26/2017    Final   Hospital Course: Patient was admitted to [facility] and taken to the OR and underwent the above state procedure without complications.  Patient tolerated the procedure well and was later transferred to the recovery room and then to the orthopaedic floor for postoperative care.  They were given PO and IV analgesics for pain control following their surgery.  They were given 24 hours of postoperative antibiotics of ancef and started on  DVT prophylaxis in the form of Xarelto.   PT and OT were ordered for total hip protocol.  The patient was allowed to be WBAT with therapy. Discharge planning was consulted to help with postop disposition and equipment needs.  Patient had a decent night on the evening  of surgery.  They started to get up OOB with therapy on day one.  Hemovac drain was pulled without difficulty.  Dressing was checked and looked okay. Patient was seen in rounds on day one, did well with therapy and was ready to go home.  Diet: Cardiac diet and Diabetic diet Activity:WBAT Follow-up:in 2 weeks Disposition - Home Discharged Condition: stable   Patient is here today for follow-up.  She is due for a repeat colonoscopy in August.  I will schedule her for that.  She is already scheduled herself for mammogram.  This will be performed within the next week.  She has done remarkably well postoperatively.  She states that she is having very minimal pain in her left hip.  However at the present time she is awaiting surgery on her right hip.  She has deferred that due to caring for her sick sister.  Her immunizations are up-to-date.  She politely declines hepatitis C screening.  She denies any chest pain shortness of breath or dyspnea on exertion.  She denies any myalgias or right upper quadrant pain.  She is due to recheck her thyroid.  Her blood pressure is outstanding at [place].  She has been checking her blood pressure at home and finding it to be averaging 120-130/80. Past Medical History:  Diagnosis Date  . Arthritis   . CHF (congestive heart failure) (HCC)    EF 45-50%  pt. denies. Dr. did further test and could not confirm per pt.  Marland Kitchen Dysrhythmia    hx of  . Headache    hx of migraines  . Hyperlipidemia   . Hypertension   . Hypothyroidism   . Knee pain   . Pelvic kidney   . Prediabetes    pt denies  . SBO (small bowel obstruction) ([place])   . SBO (small bowel obstruction) ([place]) 10/2016  . Shoulder pain   . Sleep  apnea    no mask needed per sleep study  . Smoker   . Thyroid disease    Past Surgical History:  Procedure Laterality Date  . ABDOMINAL HYSTERECTOMY  1986  . BACK SURGERY    . CHOLECYSTECTOMY    . ECTOPIC PREGNANCY SURGERY    . JOINT REPLACEMENT     Left hip total Dr. [name] 10-01-17  . polypectomy-oropharynx    . right knee meniscus    . TOTAL HIP ARTHROPLASTY Left 10/01/2017   Procedure: LEFT  TOTAL HIP ARTHROPLASTY ANTERIOR APPROACH;  Surgeon: [name], MD;  Location: [facility];  Service: Orthopedics;  Laterality: Left;   Current Outpatient Medications on File Prior to Visit  Medication Sig Dispense Refill  . amLODipine (NORVASC) 10 MG tablet TAKE ONE TABLET BY MOUTH ONCE DAILY (Patient taking differently: TAKE ONE TABLET BY MOUTH ONCE DAILY IN THE AFTERNOON) 90 tablet 2  . Carboxymethylcellul-Glycerin (LUBRICATING EYE DROPS OP) Place 1 drop into both eyes daily as needed (redness).    . carvedilol (COREG) 3.125 MG tablet Take 3.125 mg by mouth daily.    . hydrochlorothiazide (HYDRODIURIL) 25 MG tablet Take 1 tablet (25 mg total) daily by mouth. (Patient taking differently: Take 25 mg by mouth daily at 2 PM. ) 90 tablet 3  . levothyroxine (SYNTHROID, LEVOTHROID) 75 MCG tablet TAKE 1 TABLET BY MOUTH ONCE DAILY 90 tablet 1   No current facility-administered medications on file prior to visit.    Allergies  Allergen Reactions  . Lisinopril Swelling    Angioedema  . Atorvastatin Other (See Comments)  Muscle aches     Social History   Socioeconomic History  . Marital status: Widowed    Spouse name: Not on file  . Number of children: Not on file  . Years of education: Not on file  . Highest education level: Not on file  Occupational History  . Occupation: retired  Scientific laboratory technician  . Financial resource strain: Not on file  . Food insecurity:    Worry: Not on file    Inability: Not on file  . Transportation needs:    Medical: Not on file    Non-medical: Not on file    Tobacco Use  . Smoking status: Former Smoker    Packs/day: 1.00    Years: 53.00    Pack years: 53.00    Types: E-cigarettes    Last attempt to quit: 11/20/2016    Years since quitting: 1.1  . Smokeless tobacco: Never Used  . Tobacco comment: Smoker since 1975 has quit and started back several times!!, now smoking 7 cigs/week  Substance and Sexual Activity  . Alcohol use: No  . Drug use: No  . Sexual activity: Not Currently    Birth control/protection: Surgical  Lifestyle  . Physical activity:    Days per week: Not on file    Minutes per session: Not on file  . Stress: Not on file  Relationships  . Social connections:    Talks on phone: Not on file    Gets together: Not on file    Attends religious service: Not on file    Active member of club or organization: Not on file    Attends meetings of clubs or organizations: Not on file    Relationship status: Not on file  . Intimate partner violence:    Fear of current or ex partner: Not on file    Emotionally abused: Not on file    Physically abused: Not on file    Forced sexual activity: Not on file  Other Topics Concern  . Not on file  Social History Narrative  . Not on file      Review of Systems  All other systems reviewed and are negative.      Objective:   Physical Exam  Constitutional: She appears well-developed and well-nourished. No distress.  Neck: Normal range of motion. No thyromegaly present.  Cardiovascular: Normal rate, regular rhythm, normal heart sounds and intact distal pulses. Exam reveals no friction rub.  No murmur heard. Pulmonary/Chest: Effort normal and breath sounds normal. No stridor. No respiratory distress. She has no wheezes. She has no rales.  Abdominal: Soft. Bowel sounds are normal. She exhibits no distension and no mass. There is no tenderness. There is no guarding.  Musculoskeletal: She exhibits no edema.  Skin: She is not diaphoretic.  Vitals reviewed.         Assessment &  Plan:  Essential hypertension - Plan: CBC with Differential/Platelet, COMPLETE METABOLIC PANEL WITH GFR, Lipid panel  Dyslipidemia  Acquired hypothyroidism - Plan: [facility]  Hospital discharge follow-up  Chronic systolic CHF (congestive heart failure) ([place])  Colon cancer screening - Plan: Ambulatory referral to Gastroenterology  Blood pressure is outstanding.  I will check a CBC, CMP, fasting lipid panel.  Given her history of hypothyroidism, I will also monitor a TSH to ensure adequate thyroid hormone replacement.  Patient is asymptomatic from the standpoint of her congestive heart failure.  She denies any dyspnea on exertion.  Her last echocardiogram was in 2015 and revealed an ejection fraction of  60%.  Clinically she is asymptomatic.  She is on appropriate therapy including a beta-blocker.  She is allergic to ACE inhibitors.  I will schedule the patient for a colonoscopy.  Monitor a cholesterol level to monitor her dyslipidemia

## 2018-01-09 ENCOUNTER — Telehealth: Payer: Self-pay | Admitting: Family Medicine

## 2018-01-09 ENCOUNTER — Ambulatory Visit
Admission: RE | Admit: 2018-01-09 | Discharge: 2018-01-09 | Disposition: A | Discharge status: Home / Self Care | Payer: PPO | Source: Ambulatory Visit | Attending: Physician Assistant | Admitting: Physician Assistant

## 2018-01-09 DIAGNOSIS — Z1231 Encounter for screening mammogram for malignant neoplasm of breast: Secondary | ICD-10-CM

## 2018-01-09 LAB — CBC WITH DIFFERENTIAL/PLATELET
BASOS ABS: 46 {cells}/uL (ref 0–200)
Basophils Relative: 0.5 %
EOS PCT: 1.5 %
Eosinophils Absolute: 138 cells/uL (ref 15–500)
HEMATOCRIT: 37.4 % (ref 35.0–45.0)
HEMOGLOBIN: 12.5 g/dL (ref 11.7–15.5)
LYMPHS ABS: 3008 {cells}/uL (ref 850–3900)
MCH: 30.1 pg (ref 27.0–33.0)
MCHC: 33.4 g/dL (ref 32.0–36.0)
MCV: 90.1 fL (ref 80.0–100.0)
MPV: 11.9 fL (ref 7.5–12.5)
Monocytes Relative: 6.3 %
NEUTROS ABS: 5428 {cells}/uL (ref 1500–7800)
Neutrophils Relative %: 59 %
Platelets: 298 10*3/uL (ref 140–400)
RBC: 4.15 10*6/uL (ref 3.80–5.10)
RDW: 13.4 % (ref 11.0–15.0)
Total Lymphocyte: 32.7 %
WBC mixed population: 580 cells/uL (ref 200–950)
WBC: 9.2 10*3/uL (ref 3.8–10.8)

## 2018-01-09 LAB — COMPLETE METABOLIC PANEL WITH GFR
AG Ratio: 1.3 (calc) (ref 1.0–2.5)
ALKALINE PHOSPHATASE (APISO): 70 U/L (ref 33–130)
ALT: 12 U/L (ref 6–29)
AST: 18 U/L (ref 10–35)
Albumin: 4 g/dL (ref 3.6–5.1)
BUN/Creatinine Ratio: 20 (calc) (ref 6–22)
BUN: 20 mg/dL (ref 7–25)
CO2: 27 mmol/L (ref 20–32)
CREATININE: 0.99 mg/dL — AB (ref 0.60–0.93)
Calcium: 9.6 mg/dL (ref 8.6–10.4)
Chloride: 103 mmol/L (ref 98–110)
GFR, Est African American: 67 mL/min/{1.73_m2} (ref >=60)
GFR, Est Non African American: 58 mL/min/{1.73_m2} — ABNORMAL LOW (ref >=60)
GLUCOSE: 115 mg/dL — AB (ref 65–99)
Globulin: 3.2 g/dL (calc) (ref 1.9–3.7)
Potassium: 4.2 mmol/L (ref 3.5–5.3)
SODIUM: 139 mmol/L (ref 135–146)
Total Bilirubin: 0.3 mg/dL (ref 0.2–1.2)
Total Protein: 7.2 g/dL (ref 6.1–8.1)

## 2018-01-09 LAB — TEST AUTHORIZATION

## 2018-01-09 LAB — LIPID PANEL
CHOL/HDL RATIO: 4.9 (calc) (ref <5.0)
CHOLESTEROL: 291 mg/dL — AB (ref <200)
HDL: 60 mg/dL (ref >50)
LDL Cholesterol (Calc): 198 mg/dL (calc) — ABNORMAL HIGH
Non-HDL Cholesterol (Calc): 231 mg/dL (calc) — ABNORMAL HIGH (ref <130)
Triglycerides: 166 mg/dL — ABNORMAL HIGH (ref <150)

## 2018-01-09 LAB — HEMOGLOBIN A1C W/OUT EAG: Hgb A1c MFr Bld: 6.1 % of total Hgb — ABNORMAL HIGH (ref <5.7)

## 2018-01-09 LAB — TSH: TSH: 2.01 m[IU]/L (ref 0.40–4.50)

## 2018-01-09 NOTE — Telephone Encounter (Signed)
Patient got a call from [place] and would like to inquire about getting this scheduled (colonscopy)

## 2018-01-12 ENCOUNTER — Encounter: Payer: Self-pay | Admitting: Gastroenterology

## 2018-01-12 NOTE — Telephone Encounter (Signed)
Called pt - no answer and vm full - looks like [facility] has the referral and will call her to set up apt.

## 2018-01-16 ENCOUNTER — Encounter: Payer: Self-pay | Admitting: Family Medicine

## 2018-01-26 ENCOUNTER — Encounter: Payer: Self-pay | Admitting: Family Medicine

## 2018-02-05 DIAGNOSIS — M25511 Pain in right shoulder: Secondary | ICD-10-CM | POA: Diagnosis not present

## 2018-02-11 ENCOUNTER — Other Ambulatory Visit: Payer: Self-pay | Admitting: Physician Assistant

## 2018-02-16 ENCOUNTER — Telehealth: Payer: Self-pay

## 2018-02-16 NOTE — Telephone Encounter (Signed)
   [facility] Pre-operative Risk Assessment    Request for surgical clearance:  1. What type of surgery is being performed?    - RIGHT total hip - anterior  2. When is this surgery scheduled?     - 03/11/18  3. Are there any medications that need to be held prior to surgery and how long?    - Please advise  4. Practice name and name of physician performing surgery?     - [facility]   - Dr. [name]  5. What is your office phone and fax number?     - Phone: [phone]   - Fax: [phone]  6. Anesthesia type (None, local, MAC, general) ?    - N/A   Velna Ochs 02/16/2018, 12:38 PM  _________________________________________________________________   (provider comments below)

## 2018-02-17 ENCOUNTER — Telehealth: Payer: Self-pay | Admitting: Family Medicine

## 2018-02-17 NOTE — Telephone Encounter (Addendum)
Patient dropped off clearance form for surgery  Will route to dr pickard

## 2018-02-17 NOTE — H&P (Signed)
TOTAL HIP ADMISSION H&P  Patient is admitted for right total hip arthroplasty.  Subjective:  Chief Complaint: right hip pain  HPI: Adrienne [name], 72 y.o. female, has a history of pain and functional disability in the right hip(s) due to arthritis and patient has failed non-surgical conservative treatments for greater than 12 weeks to include NSAID's and/or analgesics, use of assistive devices and activity modification.  Onset of symptoms was gradual starting 3 years ago with gradually worsening course since that time.The patient noted no past surgery on the right hip(s).  Patient currently rates pain in the right hip at 5 out of 10 with activity. Patient has night pain, worsening of pain with activity and weight bearing and instability. Patient has evidence of severe end-stage arthritis bone-on-bone with large osteophyte formation by imaging studies. This condition presents safety issues increasing the risk of falls. There is no current active infection.  Patient Active Problem List   Diagnosis Date Noted  . OA (osteoarthritis) of hip 10/01/2017  . Prediabetes   . Impaired glucose tolerance 11/14/2016  . Bowel obstruction ([place]) 11/13/2016  . Hyperglycemia 11/13/2016  . Cataract of both eyes 01/25/2016  . Glaucoma of both eyes 01/25/2016  . Well woman exam with routine gynecological exam 01/17/2016  . Bradycardia 06/29/2015  . Hypokalemia 06/29/2015  . SBO (small bowel obstruction) ([place]) 06/28/2015  . Arthritis of knee, right 01/04/2015  . Bursitis of left shoulder 01/04/2015  . Routine gynecological examination 12/30/2013  . Noncompliance with medications 12/02/2013  . Smoker   . Hypertension 10/04/2010  . Dyslipidemia 10/04/2010  . Chest pain 10/04/2010  . Chronic systolic CHF (congestive heart failure) ([place]) 10/04/2010  . Hypothyroidism 10/04/2010   Past Medical History:  Diagnosis Date  . Arthritis   . CHF (congestive heart failure) (HCC)    EF 45-50%  pt. denies. Dr. did  further test and could not confirm per pt.  Marland Kitchen Dysrhythmia    hx of  . Headache    hx of migraines  . Hyperlipidemia   . Hypertension   . Hypothyroidism   . Knee pain   . Pelvic kidney   . Prediabetes    pt denies  . SBO (small bowel obstruction) ([place])   . SBO (small bowel obstruction) ([place]) 10/2016  . Shoulder pain   . Sleep apnea    no mask needed per sleep study  . Smoker   . Thyroid disease     Past Surgical History:  Procedure Laterality Date  . ABDOMINAL HYSTERECTOMY  1986  . BACK SURGERY    . CHOLECYSTECTOMY    . ECTOPIC PREGNANCY SURGERY    . JOINT REPLACEMENT     Left hip total Dr. [name] 10-01-17  . polypectomy-oropharynx    . right knee meniscus    . TOTAL HIP ARTHROPLASTY Left 10/01/2017   Procedure: LEFT  TOTAL HIP ARTHROPLASTY ANTERIOR APPROACH;  Surgeon: [name], MD;  Location: [facility];  Service: Orthopedics;  Laterality: Left;    No current facility-administered medications for this encounter.    Current Outpatient Medications  Medication Sig Dispense Refill Last Dose  . amLODipine (NORVASC) 10 MG tablet TAKE 1 TABLET BY MOUTH ONCE DAILY 90 tablet 2   . Carboxymethylcellul-Glycerin (LUBRICATING EYE DROPS OP) Place 1 drop into both eyes daily as needed (redness).   Taking  . carvedilol (COREG) 3.125 MG tablet Take 3.125 mg by mouth daily.   Taking  . hydrochlorothiazide (HYDRODIURIL) 25 MG tablet Take 1 tablet (25 mg total) daily  by mouth. (Patient taking differently: Take 25 mg by mouth daily at 2 PM. ) 90 tablet 3 Taking  . levothyroxine (SYNTHROID, LEVOTHROID) 75 MCG tablet TAKE 1 TABLET BY MOUTH ONCE DAILY 90 tablet 1    Allergies  Allergen Reactions  . Lisinopril Swelling    Angioedema  . Atorvastatin Other (See Comments)    Muscle aches      Social History   Tobacco Use  . Smoking status: Former Smoker    Packs/day: 1.00    Years: 53.00    Pack years: 53.00    Types: E-cigarettes    Last attempt to quit: 11/20/2016    Years since  quitting: 1.2  . Smokeless tobacco: Never Used  . Tobacco comment: Smoker since 1975 has quit and started back several times!!, now smoking 7 cigs/week  Substance Use Topics  . Alcohol use: No    Family History  Problem Relation Age of Onset  . Cancer Sister   . Diabetes Brother   . Diabetes Brother   . Hypertension Other   . Breast cancer Sister      Review of Systems  Constitutional: Negative for chills and fever.  HENT: Negative for congestion, sore throat and tinnitus.   Eyes: Negative for double vision, photophobia and pain.  Respiratory: Negative for cough, shortness of breath and wheezing.   Cardiovascular: Negative for chest pain, palpitations and orthopnea.  Gastrointestinal: Negative for heartburn, nausea and vomiting.  Genitourinary: Negative for dysuria, frequency and urgency.  Musculoskeletal: Positive for joint pain.  Neurological: Negative for dizziness, weakness and headaches.  Psychiatric/Behavioral: Negative for depression.    Objective:  Physical Exam  Well nourished and well developed. General: Alert and oriented x3, cooperative and pleasant, no acute distress. Head: normocephalic, atraumatic, neck supple. Eyes: EOMI. Respiratory: breath sounds clear in all fields, no wheezing, rales, or rhonchi. Cardiovascular: Regular rate and rhythm, no murmurs, gallops or rubs.  Abdomen: non-tender to palpation and soft, normoactive bowel sounds. Musculoskeletal: Antalgic gait to the right walking with a cane.  RIGHT hip can be flexed to 100 with no internal rotation. Proximally 20 of external rotation and 20 of abduction. Calves soft and nontender. Motor function intact in LE. Strength 5/5 LE bilaterally. Neuro: Distal pulses 2+. Sensation to light touch intact in LE.  Vital signs in last 24 hours: Blood pressure: 132/84 mmHg Pulse: 56 bpm  Labs:   Estimated body mass index is 32.95 kg/m as calculated from the following:   Height as of 01/06/18: 5\' 3"   (1.6 m).   Weight as of 01/06/18: 84.4 kg.   Imaging Review Plain radiographs demonstrate severe degenerative joint disease of the right hip(s). The bone quality appears to be adequate for age and reported activity level.    Preoperative templating of the joint replacement has been completed, documented, and submitted to the Operating Room personnel in order to optimize intra-operative equipment management.     Assessment/Plan:  End stage arthritis, right hip(s)  The patient history, physical examination, clinical judgement of the provider and imaging studies are consistent with end stage degenerative joint disease of the right hip(s) and total hip arthroplasty is deemed medically necessary. The treatment options including medical management, injection therapy, arthroscopy and arthroplasty were discussed at length. The risks and benefits of total hip arthroplasty were presented and reviewed. The risks due to aseptic loosening, infection, stiffness, dislocation/subluxation,  thromboembolic complications and other imponderables were discussed.  The patient acknowledged the explanation, agreed to proceed with the plan and consent  was signed. Patient is being admitted for inpatient treatment for surgery, pain control, PT, OT, prophylactic antibiotics, VTE prophylaxis, progressive ambulation and ADL's and discharge planning.The patient is planning to be discharged home with home health services.    Therapy Plans: HHPT Disposition: Home with daugher and granddaughter Planned DVT Prophylaxis: Aspirin 325 mg BID DME needed: None PCP: Dr. Dennard Schaumann TXA: IV Allergies: Atorvastatin, lisinopril  - Patient was instructed on what medications to stop prior to surgery. - Follow-up visit in 2 weeks with Dr. [name] - Begin physical therapy following surgery - Pre-operative lab work as pre-surgical testing - Prescriptions will be provided in hospital at time of discharge  Theresa Duty,  PA-C Orthopedic Surgery [facility] [place]

## 2018-02-18 NOTE — Telephone Encounter (Signed)
   Primary Cardiologist: Mertie Moores, MD  Chart reviewed as part of pre-operative protocol coverage. Patient was contacted 02/18/2018 in reference to pre-operative risk assessment for pending surgery as outlined below.  Adrienne [name] was last seen on 12/05/2017 by Dr Acie Fredrickson.  Since that day, Adrienne [name] has done well.  She is not having chest pain or shortness of breath.  She tolerated hip surgery on the left in April and did well with this.  From a heart standpoint, there are no medications that need to be held.  She should take her carvedilol throughout the perioperative..  Therefore, based on ACC/AHA guidelines, the patient would be at acceptable risk for the planned procedure without further cardiovascular testing.   I will route this recommendation to the requesting party via Epic fax function and remove from pre-op pool.  Please call with questions.  [name], PA-C 02/18/2018, 4:54 PM     [facility]                   - Dr. [name]  5. What is your office phone and fax number?                     - Phone: [phone]                   - Fax: [phone]

## 2018-02-19 ENCOUNTER — Encounter: Payer: Self-pay | Admitting: Gastroenterology

## 2018-02-19 ENCOUNTER — Other Ambulatory Visit: Payer: Self-pay

## 2018-02-19 ENCOUNTER — Ambulatory Visit (AMBULATORY_SURGERY_CENTER): Payer: Self-pay | Admitting: *Deleted

## 2018-02-19 VITALS — Ht 62.0 in | Wt 187.2 lb

## 2018-02-19 DIAGNOSIS — Z1211 Encounter for screening for malignant neoplasm of colon: Secondary | ICD-10-CM

## 2018-02-19 NOTE — Progress Notes (Signed)
Patient denies any allergies to egg or soy products. Patient denies complications with anesthesia/sedation.  Patient denies oxygen use at home and denies diet medications. Patient denied information on colonoscpy.

## 2018-02-19 NOTE — Telephone Encounter (Signed)
Paperwork completed and faxed back to [employer] at [phone]

## 2018-02-26 NOTE — Progress Notes (Signed)
Clearance Dr. Cathie Olden cardiology 03-11-18 chart Clearance Dr. Dennard Schaumann chart   ekg 08-20-17 epic

## 2018-02-26 NOTE — Patient Instructions (Signed)
Adrienne [name]  02/26/2018   Your procedure is scheduled on:   03-11-18  Report to [facility] Main  Entrance  Report to admitting at     0830 AM    Call this number if you have problems the morning of surgery [phone]   Remember: Do not eat food or drink liquids :After Midnight.     Take these medicines the morning of surgery with A SIP OF WATER: levothyroxine, amlodipine, carvedilol                                You may not have any metal on your body including hair pins and              piercings  Do not wear jewelry, make-up, lotions, powders or perfumes, deodorant             Do not wear nail polish.  Do not shave  48 hours prior to surgery.              Do not bring valuables to the hospital. Waterproof.  Contacts, dentures or bridgework may not be worn into surgery.  Leave suitcase in the car. After surgery it may be brought to your room.                Please read over the following fact sheets you were given: _____________________________________________________________________           [facility] - Preparing for Surgery Before surgery, you can play an important role.  Because skin is not sterile, your skin needs to be as free of germs as possible.  You can reduce the number of germs on your skin by washing with CHG (chlorahexidine gluconate) soap before surgery.  CHG is an antiseptic cleaner which kills germs and bonds with the skin to continue killing germs even after washing. Please DO NOT use if you have an allergy to CHG or antibacterial soaps.  If your skin becomes reddened/irritated stop using the CHG and inform your nurse when you arrive at Short Stay. Do not shave (including legs and underarms) for at least 48 hours prior to the first CHG shower.  You may shave your face/neck. Please follow these instructions carefully:  1.  Shower with CHG Soap the night before surgery and the  morning  of Surgery.  2.  If you choose to wash your hair, wash your hair first as usual with your  normal  shampoo.  3.  After you shampoo, rinse your hair and body thoroughly to remove the  shampoo.                           4.  Use CHG as you would any other liquid soap.  You can apply chg directly  to the skin and wash                       Gently with a scrungie or clean washcloth.  5.  Apply the CHG Soap to your body ONLY FROM THE NECK DOWN.   Do not use on face/ open  Wound or open sores. Avoid contact with eyes, ears mouth and genitals (private parts).                       Wash face,  Genitals (private parts) with your normal soap.             6.  Wash thoroughly, paying special attention to the area where your surgery  will be performed.  7.  Thoroughly rinse your body with warm water from the neck down.  8.  DO NOT shower/wash with your normal soap after using and rinsing off  the CHG Soap.                9.  Pat yourself dry with a clean towel.            10.  Wear clean pajamas.            11.  Place clean sheets on your bed the night of your first shower and do not  sleep with pets. Day of Surgery : Do not apply any lotions/deodorants the morning of surgery.  Please wear clean clothes to the hospital/surgery center.  FAILURE TO FOLLOW THESE INSTRUCTIONS MAY RESULT IN THE CANCELLATION OF YOUR SURGERY PATIENT SIGNATURE_________________________________  NURSE SIGNATURE__________________________________  ________________________________________________________________________  WHAT IS A BLOOD TRANSFUSION? Blood Transfusion Information  A transfusion is the replacement of blood or some of its parts. Blood is made up of multiple cells which provide different functions.  Red blood cells carry oxygen and are used for blood loss replacement.  White blood cells fight against infection.  Platelets control bleeding.  Plasma helps clot blood.  Other blood products  are available for specialized needs, such as hemophilia or other clotting disorders. BEFORE THE TRANSFUSION  Who gives blood for transfusions?   Healthy volunteers who are fully evaluated to make sure their blood is safe. This is blood bank blood. Transfusion therapy is the safest it has ever been in the practice of medicine. Before blood is taken from a donor, a complete history is taken to make sure that person has no history of diseases nor engages in risky social behavior (examples are intravenous drug use or sexual activity with multiple partners). The donor's travel history is screened to minimize risk of transmitting infections, such as malaria. The donated blood is tested for signs of infectious diseases, such as HIV and hepatitis. The blood is then tested to be sure it is compatible with you in order to minimize the chance of a transfusion reaction. If you or a relative donates blood, this is often done in anticipation of surgery and is not appropriate for emergency situations. It takes many days to process the donated blood. RISKS AND COMPLICATIONS Although transfusion therapy is very safe and saves many lives, the main dangers of transfusion include:   Getting an infectious disease.  Developing a transfusion reaction. This is an allergic reaction to something in the blood you were given. Every precaution is taken to prevent this. The decision to have a blood transfusion has been considered carefully by your caregiver before blood is given. Blood is not given unless the benefits outweigh the risks. AFTER THE TRANSFUSION  Right after receiving a blood transfusion, you will usually feel much better and more energetic. This is especially true if your red blood cells have gotten low (anemic). The transfusion raises the level of the red blood cells which carry oxygen, and this usually causes an energy increase.  The  nurse administering the transfusion will monitor you carefully for  complications. HOME CARE INSTRUCTIONS  No special instructions are needed after a transfusion. You may find your energy is better. Speak with your caregiver about any limitations on activity for underlying diseases you may have. SEEK MEDICAL CARE IF:   Your condition is not improving after your transfusion.  You develop redness or irritation at the intravenous (IV) site. SEEK IMMEDIATE MEDICAL CARE IF:  Any of the following symptoms occur over the next 12 hours:  Shaking chills.  You have a temperature by mouth above 102 F (38.9 C), not controlled by medicine.  Chest, back, or muscle pain.  People around you feel you are not acting correctly or are confused.  Shortness of breath or difficulty breathing.  Dizziness and fainting.  You get a rash or develop hives.  You have a decrease in urine output.  Your urine turns a dark color or changes to pink, red, or brown. Any of the following symptoms occur over the next 10 days:  You have a temperature by mouth above 102 F (38.9 C), not controlled by medicine.  Shortness of breath.  Weakness after normal activity.  The white part of the eye turns yellow (jaundice).  You have a decrease in the amount of urine or are urinating less often.  Your urine turns a dark color or changes to pink, red, or brown. Document Released: 06/07/2000 Document Revised: 09/02/2011 Document Reviewed: 01/25/2008 ExitCare Patient Information 2014 [place].  _______________________________________________________________________  Incentive Spirometer  An incentive spirometer is a tool that can help keep your lungs clear and active. This tool measures how well you are filling your lungs with each breath. Taking long deep breaths may help reverse or decrease the chance of developing breathing (pulmonary) problems (especially infection) following:  A long period of time when you are unable to move or be active. BEFORE THE PROCEDURE   If  the spirometer includes an indicator to show your best effort, your nurse or respiratory therapist will set it to a desired goal.  If possible, sit up straight or lean slightly forward. Try not to slouch.  Hold the incentive spirometer in an upright position. INSTRUCTIONS FOR USE  1. Sit on the edge of your bed if possible, or sit up as far as you can in bed or on a chair. 2. Hold the incentive spirometer in an upright position. 3. Breathe out normally. 4. Place the mouthpiece in your mouth and seal your lips tightly around it. 5. Breathe in slowly and as deeply as possible, raising the piston or the ball toward the top of the column. 6. Hold your breath for 3-5 seconds or for as long as possible. Allow the piston or ball to fall to the bottom of the column. 7. Remove the mouthpiece from your mouth and breathe out normally. 8. Rest for a few seconds and repeat Steps 1 through 7 at least 10 times every 1-2 hours when you are awake. Take your time and take a few normal breaths between deep breaths. 9. The spirometer may include an indicator to show your best effort. Use the indicator as a goal to work toward during each repetition. 10. After each set of 10 deep breaths, practice coughing to be sure your lungs are clear. If you have an incision (the cut made at the time of surgery), support your incision when coughing by placing a pillow or rolled up towels firmly against it. Once you are able to get  out of bed, walk around indoors and cough well. You may stop using the incentive spirometer when instructed by your caregiver.  RISKS AND COMPLICATIONS  Take your time so you do not get dizzy or light-headed.  If you are in pain, you may need to take or ask for pain medication before doing incentive spirometry. It is harder to take a deep breath if you are having pain. AFTER USE  Rest and breathe slowly and easily.  It can be helpful to keep track of a log of your progress. Your caregiver can  provide you with a simple table to help with this. If you are using the spirometer at home, follow these instructions: [place] IF:   You are having difficultly using the spirometer.  You have trouble using the spirometer as often as instructed.  Your pain medication is not giving enough relief while using the spirometer.  You develop fever of 100.5 F (38.1 C) or higher. SEEK IMMEDIATE MEDICAL CARE IF:   You cough up bloody sputum that had not been present before.  You develop fever of 102 F (38.9 C) or greater.  You develop worsening pain at or near the incision site. MAKE SURE YOU:   Understand these instructions.  Will watch your condition.  Will get help right away if you are not doing well or get worse. Document Released: 10/21/2006 Document Revised: 09/02/2011 Document Reviewed: 12/22/2006 [facility] Patient Information 2014 [place], [place].   ________________________________________________________________________

## 2018-02-27 ENCOUNTER — Other Ambulatory Visit: Payer: Self-pay

## 2018-02-27 ENCOUNTER — Encounter: Payer: Self-pay | Admitting: Gastroenterology

## 2018-02-27 ENCOUNTER — Ambulatory Visit (AMBULATORY_SURGERY_CENTER): Payer: PPO | Admitting: Gastroenterology

## 2018-02-27 VITALS — BP 135/67 | HR 44 | Temp 98.0°F | Resp 15 | Ht 63.0 in | Wt 186.0 lb

## 2018-02-27 DIAGNOSIS — D127 Benign neoplasm of rectosigmoid junction: Secondary | ICD-10-CM

## 2018-02-27 DIAGNOSIS — D124 Benign neoplasm of descending colon: Secondary | ICD-10-CM | POA: Diagnosis not present

## 2018-02-27 DIAGNOSIS — Z1211 Encounter for screening for malignant neoplasm of colon: Secondary | ICD-10-CM

## 2018-02-27 DIAGNOSIS — D122 Benign neoplasm of ascending colon: Secondary | ICD-10-CM

## 2018-02-27 HISTORY — PX: COLONOSCOPY WITH PROPOFOL: SHX5780

## 2018-02-27 MED ORDER — SODIUM CHLORIDE 0.9 % IV SOLN: 500.0000 mL | Freq: Once | INTRAVENOUS | Status: DC

## 2018-02-27 NOTE — Progress Notes (Signed)
A and O x3. Report to RN. Tolerated MAC anesthesia well.

## 2018-02-27 NOTE — Progress Notes (Signed)
Called to room to assist during endoscopic procedure.  Patient ID and intended procedure confirmed with present staff. Received instructions for my participation in the procedure from the performing physician.  

## 2018-02-27 NOTE — Op Note (Signed)
[place] Patient Name: Adrienne [name] Procedure Date: 02/27/2018 8:35 AM MRN: [id number] Endoscopist: Mauri Pole , MD Age: 72 Referring MD:  Date of Birth: [birth year] Gender: Female Account #: [id number] Procedure:                Colonoscopy Indications:              Screening for colorectal malignant neoplasm Medicines:                Monitored Anesthesia Care Procedure:                Pre-Anesthesia Assessment:                           - Prior to the procedure, a History and Physical                            was performed, and patient medications and                            allergies were reviewed. The patient's tolerance of                            previous anesthesia was also reviewed. The risks                            and benefits of the procedure and the sedation                            options and risks were discussed with the patient.                            All questions were answered, and informed consent                            was obtained. Prior Anticoagulants: The patient has                            taken no previous anticoagulant or antiplatelet                            agents. ASA Grade Assessment: II - A patient with                            mild systemic disease. After reviewing the risks                            and benefits, the patient was deemed in                            satisfactory condition to undergo the procedure.                           After obtaining informed consent, the colonoscope  was passed under direct vision. Throughout the                            procedure, the patient's blood pressure, pulse, and                            oxygen saturations were monitored continuously. The                            Colonoscope was introduced through the anus and                            advanced to the the cecum, identified by                            appendiceal orifice  and ileocecal valve. The                            colonoscopy was performed without difficulty. The                            patient tolerated the procedure well. The quality                            of the bowel preparation was good. The ileocecal                            valve, appendiceal orifice, and rectum were                            photographed. Scope In: 8:45:31 AM Scope Out: 9:06:27 AM Scope Withdrawal Time: 0 hours 14 minutes 6 seconds  Total Procedure Duration: 0 hours 20 minutes 56 seconds  Findings:                 The perianal and digital rectal examinations were                            normal.                           A 1 mm polyp was found in the ascending colon. The                            polyp was sessile. The polyp was removed with a                            cold biopsy forceps. Resection and retrieval were                            complete.                           A 4 mm polyp was found in the descending colon. The  polyp was sessile. The polyp was removed with a                            cold snare. Resection and retrieval were complete.                           A 7 mm polyp was found in the recto-sigmoid colon.                            The polyp was semi-pedunculated. The polyp was                            removed with a hot snare. Resection and retrieval                            were complete.                           Scattered small-mouthed diverticula were found in                            the sigmoid colon, descending colon, transverse                            colon and ascending colon.                           Non-bleeding internal hemorrhoids were found during                            retroflexion. The hemorrhoids were small.                           The exam was otherwise without abnormality. Complications:            No immediate complications. Estimated Blood Loss:     Estimated blood  loss was minimal. Impression:               - One 1 mm polyp in the ascending colon, removed                            with a cold biopsy forceps. Resected and retrieved.                           - One 4 mm polyp in the descending colon, removed                            with a cold snare. Resected and retrieved.                           - One 7 mm polyp at the recto-sigmoid colon,                            removed with a hot snare. Resected and retrieved.                           -  Diverticulosis in the sigmoid colon, in the                            descending colon, in the transverse colon and in                            the ascending colon.                           - Non-bleeding internal hemorrhoids.                           - The examination was otherwise normal. Recommendation:           - Patient has a contact number available for                            emergencies. The signs and symptoms of potential                            delayed complications were discussed with the                            patient. Return to normal activities tomorrow.                            Written discharge instructions were provided to the                            patient.                           - Resume previous diet.                           - Continue present medications.                           - Await pathology results.                           - Repeat colonoscopy in 3 - 5 years for                            surveillance based on pathology results. Mauri Pole, MD 02/27/2018 9:11:07 AM This report has been signed electronically.

## 2018-02-27 NOTE — Progress Notes (Signed)
Pt's states no medical or surgical changes since previsit or office visit. 

## 2018-02-27 NOTE — Patient Instructions (Signed)
YOU HAD AN ENDOSCOPIC PROCEDURE TODAY AT THE  ENDOSCOPY CENTER:   Refer to the procedure report that was given to you for any specific questions about what was found during the examination.  If the procedure report does not answer your questions, please call your gastroenterologist to clarify.  If you requested that your care partner not be given the details of your procedure findings, then the procedure report has been included in a sealed envelope for you to review at your convenience later.  YOU SHOULD EXPECT: Some feelings of bloating in the abdomen. Passage of more gas than usual.  Walking can help get rid of the air that was put into your GI tract during the procedure and reduce the bloating. If you had a lower endoscopy (such as a colonoscopy or flexible sigmoidoscopy) you may notice spotting of blood in your stool or on the toilet paper. If you underwent a bowel prep for your procedure, you may not have a normal bowel movement for a few days.  Please Note:  You might notice some irritation and congestion in your nose or some drainage.  This is from the oxygen used during your procedure.  There is no need for concern and it should clear up in a day or so.  SYMPTOMS TO REPORT IMMEDIATELY:   Following lower endoscopy (colonoscopy or flexible sigmoidoscopy):  Excessive amounts of blood in the stool  Significant tenderness or worsening of abdominal pains  Swelling of the abdomen that is new, acute  Fever of 100F or higher   For urgent or emergent issues, a gastroenterologist can be reached at any hour by calling [phone].   DIET:  We do recommend a small meal at first, but then you may proceed to your regular diet.  Drink plenty of fluids but you should avoid alcoholic beverages for 24 hours.  ACTIVITY:  You should plan to take it easy for the rest of today and you should NOT DRIVE or use heavy machinery until tomorrow (because of the sedation medicines used during the test).     FOLLOW UP: Our staff will call the number listed on your records the next business day following your procedure to check on you and address any questions or concerns that you may have regarding the information given to you following your procedure. If we do not reach you, we will leave a message.  However, if you are feeling well and you are not experiencing any problems, there is no need to return our call.  We will assume that you have returned to your regular daily activities without incident.  If any biopsies were taken you will be contacted by phone or by letter within the next 1-3 weeks.  Please call us at [phone] if you have not heard about the biopsies in 3 weeks.    SIGNATURES/CONFIDENTIALITY: You and/or your care partner have signed paperwork which will be entered into your electronic medical record.  These signatures attest to the fact that that the information above on your After Visit Summary has been reviewed and is understood.  Full responsibility of the confidentiality of this discharge information lies with you and/or your care-partner.  Polyp, diverticulosis, hemorrhoid information given. 

## 2018-03-02 ENCOUNTER — Telehealth: Payer: Self-pay | Admitting: *Deleted

## 2018-03-02 NOTE — Telephone Encounter (Signed)
  Follow up Call-  Call back number 02/27/2018  Post procedure Call Back phone  # [phone]  Permission to leave phone message Yes  Some recent data might be hidden     Patient questions:  Do you have a fever, pain , or abdominal swelling? No. Pain Score  0 *  Have you tolerated food without any problems? Yes.    Have you been able to return to your normal activities? Yes.    Do you have any questions about your discharge instructions: Diet   No. Medications  No. Follow up visit  No.  Do you have questions or concerns about your Care? No.  Actions: * If pain score is 4 or above: No action needed, pain <4.

## 2018-03-03 ENCOUNTER — Inpatient Hospital Stay (HOSPITAL_COMMUNITY)
Admission: RE | Admit: 2018-03-03 | Discharge: 2018-03-03 | Disposition: A | Discharge status: Home / Self Care | Payer: PPO | Source: Ambulatory Visit

## 2018-03-04 ENCOUNTER — Other Ambulatory Visit (HOSPITAL_COMMUNITY): Payer: Self-pay | Admitting: *Deleted

## 2018-03-04 NOTE — Patient Instructions (Signed)
Adrienne [name]  03/04/2018   Your procedure is scheduled on: 03-12-18  Report to [facility] Main  Entrance  Report to admitting at 830 AM    Call this number if you have problems the morning of surgery [phone]   Remember: Do not eat food or drink liquids :After Midnight.     Take these medicines the morning of surgery with A SIP OF WATER: LEVOTHYROXINE, AMLODIPINE, CARVEDILOL              You may not have any metal on your body including hair pins and              piercings  Do not wear jewelry, make-up, lotions, powders or perfumes, deodorant             Do not wear nail polish.  Do not shave  48 hours prior to surgery.              Men may shave face and neck.   Do not bring valuables to the hospital. [place].  Contacts, dentures or bridgework may not be worn into surgery.  Leave suitcase in the car. After surgery it may be brought to your room.                 Please read over the following fact sheets you were given: _____________________________________________________________________             [facility] - Preparing for Surgery Before surgery, you can play an important role.  Because skin is not sterile, your skin needs to be as free of germs as possible.  You can reduce the number of germs on your skin by washing with CHG (chlorahexidine gluconate) soap before surgery.  CHG is an antiseptic cleaner which kills germs and bonds with the skin to continue killing germs even after washing. Please DO NOT use if you have an allergy to CHG or antibacterial soaps.  If your skin becomes reddened/irritated stop using the CHG and inform your nurse when you arrive at Short Stay. Do not shave (including legs and underarms) for at least 48 hours prior to the first CHG shower.  You may shave your face/neck. Please follow these instructions carefully:  1.  Shower with CHG Soap the night before surgery and  the  morning of Surgery.  2.  If you choose to wash your hair, wash your hair first as usual with your  normal  shampoo.  3.  After you shampoo, rinse your hair and body thoroughly to remove the  shampoo.                           4.  Use CHG as you would any other liquid soap.  You can apply chg directly  to the skin and wash                       Gently with a scrungie or clean washcloth.  5.  Apply the CHG Soap to your body ONLY FROM THE NECK DOWN.   Do not use on face/ open  Wound or open sores. Avoid contact with eyes, ears mouth and genitals (private parts).                       Wash face,  Genitals (private parts) with your normal soap.             6.  Wash thoroughly, paying special attention to the area where your surgery  will be performed.  7.  Thoroughly rinse your body with warm water from the neck down.  8.  DO NOT shower/wash with your normal soap after using and rinsing off  the CHG Soap.                9.  Pat yourself dry with a clean towel.            10.  Wear clean pajamas.            11.  Place clean sheets on your bed the night of your first shower and do not  sleep with pets. Day of Surgery : Do not apply any lotions/deodorants the morning of surgery.  Please wear clean clothes to the hospital/surgery center.  FAILURE TO FOLLOW THESE INSTRUCTIONS MAY RESULT IN THE CANCELLATION OF YOUR SURGERY PATIENT SIGNATURE_________________________________  NURSE SIGNATURE__________________________________  ________________________________________________________________________   Adrienne [name]  An incentive spirometer is a tool that can help keep your lungs clear and active. This tool measures how well you are filling your lungs with each breath. Taking long deep breaths may help reverse or decrease the chance of developing breathing (pulmonary) problems (especially infection) following:  A long period of time when you are unable to move or be  active. BEFORE THE PROCEDURE   If the spirometer includes an indicator to show your best effort, your nurse or respiratory therapist will set it to a desired goal.  If possible, sit up straight or lean slightly forward. Try not to slouch.  Hold the incentive spirometer in an upright position. INSTRUCTIONS FOR USE  1. Sit on the edge of your bed if possible, or sit up as far as you can in bed or on a chair. 2. Hold the incentive spirometer in an upright position. 3. Breathe out normally. 4. Place the mouthpiece in your mouth and seal your lips tightly around it. 5. Breathe in slowly and as deeply as possible, raising the piston or the ball toward the top of the column. 6. Hold your breath for 3-5 seconds or for as long as possible. Allow the piston or ball to fall to the bottom of the column. 7. Remove the mouthpiece from your mouth and breathe out normally. 8. Rest for a few seconds and repeat Steps 1 through 7 at least 10 times every 1-2 hours when you are awake. Take your time and take a few normal breaths between deep breaths. 9. The spirometer may include an indicator to show your best effort. Use the indicator as a goal to work toward during each repetition. 10. After each set of 10 deep breaths, practice coughing to be sure your lungs are clear. If you have an incision (the cut made at the time of surgery), support your incision when coughing by placing a pillow or rolled up towels firmly against it. Once you are able to get out of bed, walk around indoors and cough well. You may stop using the incentive spirometer when instructed by your caregiver.  RISKS AND COMPLICATIONS  Take your time so you do not get  dizzy or light-headed.  If you are in pain, you may need to take or ask for pain medication before doing incentive spirometry. It is harder to take a deep breath if you are having pain. AFTER USE  Rest and breathe slowly and easily.  It can be helpful to keep track of a log of  your progress. Your caregiver can provide you with a simple table to help with this. If you are using the spirometer at home, follow these instructions: [place] IF:   You are having difficultly using the spirometer.  You have trouble using the spirometer as often as instructed.  Your pain medication is not giving enough relief while using the spirometer.  You develop fever of 100.5 F (38.1 C) or higher. SEEK IMMEDIATE MEDICAL CARE IF:   You cough up bloody sputum that had not been present before.  You develop fever of 102 F (38.9 C) or greater.  You develop worsening pain at or near the incision site. MAKE SURE YOU:   Understand these instructions.  Will watch your condition.  Will get help right away if you are not doing well or get worse. Document Released: 10/21/2006 Document Revised: 09/02/2011 Document Reviewed: 12/22/2006 Exit[employer] Patient Information 2014 [employer], [place].   ________________________________________________________________________  WHAT IS A BLOOD TRANSFUSION? Blood Transfusion Information  A transfusion is the replacement of blood or some of its parts. Blood is made up of multiple cells which provide different functions.  Red blood cells carry oxygen and are used for blood loss replacement.  White blood cells fight against infection.  Platelets control bleeding.  Plasma helps clot blood.  Other blood products are available for specialized needs, such as hemophilia or other clotting disorders. BEFORE THE TRANSFUSION  Who gives blood for transfusions?   Healthy volunteers who are fully evaluated to make sure their blood is safe. This is blood bank blood. Transfusion therapy is the safest it has ever been in the practice of medicine. Before blood is taken from a donor, a complete history is taken to make sure that person has no history of diseases nor engages in risky social behavior (examples are intravenous drug use or sexual activity  with multiple partners). The donor's travel history is screened to minimize risk of transmitting infections, such as malaria. The donated blood is tested for signs of infectious diseases, such as HIV and hepatitis. The blood is then tested to be sure it is compatible with you in order to minimize the chance of a transfusion reaction. If you or a relative donates blood, this is often done in anticipation of surgery and is not appropriate for emergency situations. It takes many days to process the donated blood. RISKS AND COMPLICATIONS Although transfusion therapy is very safe and saves many lives, the main dangers of transfusion include:   Getting an infectious disease.  Developing a transfusion reaction. This is an allergic reaction to something in the blood you were given. Every precaution is taken to prevent this. The decision to have a blood transfusion has been considered carefully by your caregiver before blood is given. Blood is not given unless the benefits outweigh the risks. AFTER THE TRANSFUSION  Right after receiving a blood transfusion, you will usually feel much better and more energetic. This is especially true if your red blood cells have gotten low (anemic). The transfusion raises the level of the red blood cells which carry oxygen, and this usually causes an energy increase.  The nurse administering the transfusion will  monitor you carefully for complications. HOME CARE INSTRUCTIONS  No special instructions are needed after a transfusion. You may find your energy is better. Speak with your caregiver about any limitations on activity for underlying diseases you may have. SEEK MEDICAL CARE IF:   Your condition is not improving after your transfusion.  You develop redness or irritation at the intravenous (IV) site. SEEK IMMEDIATE MEDICAL CARE IF:  Any of the following symptoms occur over the next 12 hours:  Shaking chills.  You have a temperature by mouth above 102 F (38.9  C), not controlled by medicine.  Chest, back, or muscle pain.  People around you feel you are not acting correctly or are confused.  Shortness of breath or difficulty breathing.  Dizziness and fainting.  You get a rash or develop hives.  You have a decrease in urine output.  Your urine turns a dark color or changes to pink, red, or brown. Any of the following symptoms occur over the next 10 days:  You have a temperature by mouth above 102 F (38.9 C), not controlled by medicine.  Shortness of breath.  Weakness after normal activity.  The white part of the eye turns yellow (jaundice).  You have a decrease in the amount of urine or are urinating less often.  Your urine turns a dark color or changes to pink, red, or brown. Document Released: 06/07/2000 Document Revised: 09/02/2011 Document Reviewed: 01/25/2008 [employer] Patient Information 2014 [place], [place].  _______________________________________________________________________

## 2018-03-05 ENCOUNTER — Other Ambulatory Visit: Payer: Self-pay

## 2018-03-05 ENCOUNTER — Encounter (HOSPITAL_COMMUNITY): Payer: Self-pay

## 2018-03-05 ENCOUNTER — Encounter (HOSPITAL_COMMUNITY)
Admission: RE | Admit: 2018-03-05 | Discharge: 2018-03-05 | Disposition: A | Discharge status: Home / Self Care | Payer: PPO | Source: Ambulatory Visit | Attending: Orthopedic Surgery | Admitting: Orthopedic Surgery

## 2018-03-05 DIAGNOSIS — I110 Hypertensive heart disease with heart failure: Secondary | ICD-10-CM | POA: Diagnosis not present

## 2018-03-05 DIAGNOSIS — E785 Hyperlipidemia, unspecified: Secondary | ICD-10-CM | POA: Diagnosis not present

## 2018-03-05 DIAGNOSIS — Z79899 Other long term (current) drug therapy: Secondary | ICD-10-CM | POA: Insufficient documentation

## 2018-03-05 DIAGNOSIS — Z01812 Encounter for preprocedural laboratory examination: Secondary | ICD-10-CM | POA: Insufficient documentation

## 2018-03-05 DIAGNOSIS — R739 Hyperglycemia, unspecified: Secondary | ICD-10-CM | POA: Insufficient documentation

## 2018-03-05 DIAGNOSIS — M1611 Unilateral primary osteoarthritis, right hip: Secondary | ICD-10-CM | POA: Diagnosis not present

## 2018-03-05 DIAGNOSIS — I5022 Chronic systolic (congestive) heart failure: Secondary | ICD-10-CM | POA: Diagnosis not present

## 2018-03-05 DIAGNOSIS — Z79890 Hormone replacement therapy (postmenopausal): Secondary | ICD-10-CM | POA: Diagnosis not present

## 2018-03-05 DIAGNOSIS — Z87891 Personal history of nicotine dependence: Secondary | ICD-10-CM | POA: Diagnosis not present

## 2018-03-05 HISTORY — DX: Tinnitus, unspecified ear: H93.19

## 2018-03-05 LAB — PROTIME-INR
INR: 0.93
Prothrombin Time: 12.4 seconds (ref 11.4–15.2)

## 2018-03-05 LAB — COMPREHENSIVE METABOLIC PANEL
ALK PHOS: 68 U/L (ref 38–126)
ALT: 15 U/L (ref 0–44)
ANION GAP: 10 (ref 5–15)
AST: 26 U/L (ref 15–41)
Albumin: 3.7 g/dL (ref 3.5–5.0)
BILIRUBIN TOTAL: 0.6 mg/dL (ref 0.3–1.2)
BUN: 15 mg/dL (ref 8–23)
CALCIUM: 9.3 mg/dL (ref 8.9–10.3)
CO2: 27 mmol/L (ref 22–32)
CREATININE: 0.9 mg/dL (ref 0.44–1.00)
Chloride: 104 mmol/L (ref 98–111)
GLUCOSE: 136 mg/dL — AB (ref 70–99)
Potassium: 3.7 mmol/L (ref 3.5–5.1)
Sodium: 141 mmol/L (ref 135–145)
TOTAL PROTEIN: 7.5 g/dL (ref 6.5–8.1)

## 2018-03-05 LAB — CBC
HEMATOCRIT: 39 % (ref 36.0–46.0)
Hemoglobin: 12.8 g/dL (ref 12.0–15.0)
MCH: 29.9 pg (ref 26.0–34.0)
MCHC: 32.8 g/dL (ref 30.0–36.0)
MCV: 91.1 fL (ref 78.0–100.0)
Platelets: 310 10*3/uL (ref 150–400)
RBC: 4.28 MIL/uL (ref 3.87–5.11)
RDW: 14.3 % (ref 11.5–15.5)
WBC: 10.4 10*3/uL (ref 4.0–10.5)

## 2018-03-05 LAB — HEMOGLOBIN A1C
Hgb A1c MFr Bld: 6.4 % — ABNORMAL HIGH (ref 4.8–5.6)
MEAN PLASMA GLUCOSE: 136.98 mg/dL

## 2018-03-05 LAB — SURGICAL PCR SCREEN
MRSA, PCR: NEGATIVE
Staphylococcus aureus: NEGATIVE

## 2018-03-05 LAB — APTT: aPTT: 29 seconds (ref 24–36)

## 2018-03-09 ENCOUNTER — Encounter: Payer: Self-pay | Admitting: Gastroenterology

## 2018-03-11 ENCOUNTER — Other Ambulatory Visit: Payer: Self-pay

## 2018-03-11 ENCOUNTER — Encounter (HOSPITAL_COMMUNITY)
Admission: RE | Disposition: A | Discharge status: Home Health | Payer: Self-pay | Source: Home / Self Care | Attending: Orthopedic Surgery

## 2018-03-11 ENCOUNTER — Inpatient Hospital Stay (HOSPITAL_COMMUNITY): Payer: PPO | Admitting: Certified Registered Nurse Anesthetist

## 2018-03-11 ENCOUNTER — Inpatient Hospital Stay (HOSPITAL_COMMUNITY): Payer: PPO

## 2018-03-11 ENCOUNTER — Encounter (HOSPITAL_COMMUNITY): Payer: Self-pay | Admitting: *Deleted

## 2018-03-11 ENCOUNTER — Inpatient Hospital Stay (HOSPITAL_COMMUNITY)
Admission: RE | Admit: 2018-03-11 | Discharge: 2018-03-12 | DRG: 470 | Disposition: A | Discharge status: Home Health | Payer: PPO | Attending: Orthopedic Surgery | Admitting: Orthopedic Surgery

## 2018-03-11 DIAGNOSIS — Z90710 Acquired absence of both cervix and uterus: Secondary | ICD-10-CM | POA: Diagnosis not present

## 2018-03-11 DIAGNOSIS — M1711 Unilateral primary osteoarthritis, right knee: Secondary | ICD-10-CM | POA: Diagnosis not present

## 2018-03-11 DIAGNOSIS — Z888 Allergy status to other drugs, medicaments and biological substances status: Secondary | ICD-10-CM | POA: Diagnosis not present

## 2018-03-11 DIAGNOSIS — E039 Hypothyroidism, unspecified: Secondary | ICD-10-CM | POA: Diagnosis not present

## 2018-03-11 DIAGNOSIS — H409 Unspecified glaucoma: Secondary | ICD-10-CM | POA: Diagnosis present

## 2018-03-11 DIAGNOSIS — M1611 Unilateral primary osteoarthritis, right hip: Principal | ICD-10-CM | POA: Diagnosis present

## 2018-03-11 DIAGNOSIS — Z96649 Presence of unspecified artificial hip joint: Secondary | ICD-10-CM

## 2018-03-11 DIAGNOSIS — R7303 Prediabetes: Secondary | ICD-10-CM | POA: Diagnosis not present

## 2018-03-11 DIAGNOSIS — E785 Hyperlipidemia, unspecified: Secondary | ICD-10-CM | POA: Diagnosis present

## 2018-03-11 DIAGNOSIS — I5022 Chronic systolic (congestive) heart failure: Secondary | ICD-10-CM | POA: Diagnosis present

## 2018-03-11 DIAGNOSIS — Z471 Aftercare following joint replacement surgery: Secondary | ICD-10-CM | POA: Diagnosis not present

## 2018-03-11 DIAGNOSIS — Z79899 Other long term (current) drug therapy: Secondary | ICD-10-CM

## 2018-03-11 DIAGNOSIS — M25551 Pain in right hip: Secondary | ICD-10-CM | POA: Diagnosis present

## 2018-03-11 DIAGNOSIS — F17210 Nicotine dependence, cigarettes, uncomplicated: Secondary | ICD-10-CM | POA: Diagnosis present

## 2018-03-11 DIAGNOSIS — H269 Unspecified cataract: Secondary | ICD-10-CM | POA: Diagnosis present

## 2018-03-11 DIAGNOSIS — M25751 Osteophyte, right hip: Secondary | ICD-10-CM | POA: Diagnosis present

## 2018-03-11 DIAGNOSIS — I110 Hypertensive heart disease with heart failure: Secondary | ICD-10-CM | POA: Diagnosis present

## 2018-03-11 DIAGNOSIS — M169 Osteoarthritis of hip, unspecified: Secondary | ICD-10-CM | POA: Diagnosis present

## 2018-03-11 DIAGNOSIS — Z96642 Presence of left artificial hip joint: Secondary | ICD-10-CM | POA: Diagnosis not present

## 2018-03-11 DIAGNOSIS — Z8249 Family history of ischemic heart disease and other diseases of the circulatory system: Secondary | ICD-10-CM | POA: Diagnosis not present

## 2018-03-11 DIAGNOSIS — Z79890 Hormone replacement therapy (postmenopausal): Secondary | ICD-10-CM

## 2018-03-11 DIAGNOSIS — H9319 Tinnitus, unspecified ear: Secondary | ICD-10-CM | POA: Diagnosis present

## 2018-03-11 DIAGNOSIS — G4730 Sleep apnea, unspecified: Secondary | ICD-10-CM | POA: Diagnosis present

## 2018-03-11 DIAGNOSIS — Z96641 Presence of right artificial hip joint: Secondary | ICD-10-CM | POA: Diagnosis not present

## 2018-03-11 HISTORY — PX: TOTAL HIP ARTHROPLASTY: SHX124

## 2018-03-11 LAB — TYPE AND SCREEN
ABO/RH(D): B POS
Antibody Screen: NEGATIVE

## 2018-03-11 SURGERY — ARTHROPLASTY, HIP, TOTAL, ANTERIOR APPROACH
Anesthesia: Spinal | Site: Hip | Laterality: Right

## 2018-03-11 MED ORDER — MIDAZOLAM HCL 2 MG/2ML IJ SOLN
INTRAMUSCULAR | Status: DC | PRN
  Administered 2018-03-11: 1 mg via INTRAVENOUS

## 2018-03-11 MED ORDER — SODIUM CHLORIDE 0.9 % IV SOLN
INTRAVENOUS | Status: DC
  Administered 2018-03-11 – 2018-03-12 (×2): via INTRAVENOUS

## 2018-03-11 MED ORDER — AMLODIPINE BESYLATE 10 MG PO TABS
10.0000 mg | ORAL_TABLET | Freq: Every day | ORAL | Status: DC
  Filled 2018-03-11: qty 1

## 2018-03-11 MED ORDER — GLYCOPYRROLATE PF 0.2 MG/ML IJ SOSY
PREFILLED_SYRINGE | INTRAMUSCULAR | Status: AC
  Filled 2018-03-11: qty 1

## 2018-03-11 MED ORDER — ACETAMINOPHEN 500 MG PO TABS
500.0000 mg | ORAL_TABLET | Freq: Four times a day (QID) | ORAL | Status: AC
  Administered 2018-03-11 – 2018-03-12 (×4): 500 mg via ORAL
  Filled 2018-03-11 (×4): qty 1

## 2018-03-11 MED ORDER — CARVEDILOL 3.125 MG PO TABS
3.1250 mg | ORAL_TABLET | Freq: Every day | ORAL | Status: DC
  Administered 2018-03-12: 3.125 mg via ORAL
  Filled 2018-03-11: qty 1

## 2018-03-11 MED ORDER — DEXAMETHASONE SODIUM PHOSPHATE 10 MG/ML IJ SOLN
INTRAMUSCULAR | Status: DC | PRN
  Administered 2018-03-11: 8 mg via INTRAVENOUS

## 2018-03-11 MED ORDER — ONDANSETRON HCL 4 MG PO TABS: 4.0000 mg | ORAL_TABLET | Freq: Four times a day (QID) | ORAL | Status: DC | PRN

## 2018-03-11 MED ORDER — PROPOFOL 10 MG/ML IV BOLUS
INTRAVENOUS | Status: AC
  Filled 2018-03-11: qty 60

## 2018-03-11 MED ORDER — DIPHENHYDRAMINE HCL 12.5 MG/5ML PO ELIX: 12.5000 mg | ORAL_SOLUTION | ORAL | Status: DC | PRN

## 2018-03-11 MED ORDER — BUPIVACAINE-EPINEPHRINE (PF) 0.25% -1:200000 IJ SOLN
INTRAMUSCULAR | Status: DC | PRN
  Administered 2018-03-11: 30 mL

## 2018-03-11 MED ORDER — PROPOFOL 10 MG/ML IV BOLUS: INTRAVENOUS | Status: DC | PRN

## 2018-03-11 MED ORDER — PHENYLEPHRINE HCL 10 MG/ML IJ SOLN
INTRAMUSCULAR | Status: AC
  Filled 2018-03-11: qty 1

## 2018-03-11 MED ORDER — HYDROCHLOROTHIAZIDE 25 MG PO TABS
25.0000 mg | ORAL_TABLET | Freq: Every day | ORAL | Status: DC
  Filled 2018-03-11: qty 1

## 2018-03-11 MED ORDER — HYDROCODONE-ACETAMINOPHEN 5-325 MG PO TABS
1.0000 | ORAL_TABLET | ORAL | Status: DC | PRN
  Administered 2018-03-11 – 2018-03-12 (×2): 1 via ORAL
  Filled 2018-03-11: qty 1
  Filled 2018-03-11: qty 2

## 2018-03-11 MED ORDER — DEXAMETHASONE SODIUM PHOSPHATE 10 MG/ML IJ SOLN
10.0000 mg | Freq: Once | INTRAMUSCULAR | Status: AC
  Administered 2018-03-12: 10 mg via INTRAVENOUS
  Filled 2018-03-11: qty 1

## 2018-03-11 MED ORDER — DEXAMETHASONE SODIUM PHOSPHATE 10 MG/ML IJ SOLN
INTRAMUSCULAR | Status: AC
  Filled 2018-03-11: qty 1

## 2018-03-11 MED ORDER — EPHEDRINE SULFATE 50 MG/ML IJ SOLN
INTRAMUSCULAR | Status: DC | PRN
  Administered 2018-03-11 (×3): 5 mg via INTRAVENOUS

## 2018-03-11 MED ORDER — HYDROMORPHONE HCL 1 MG/ML IJ SOLN: 0.2500 mg | INTRAMUSCULAR | Status: DC | PRN

## 2018-03-11 MED ORDER — PHENOL 1.4 % MT LIQD
1.0000 | OROMUCOSAL | Status: DC | PRN
  Filled 2018-03-11: qty 177

## 2018-03-11 MED ORDER — ONDANSETRON HCL 4 MG/2ML IJ SOLN
INTRAMUSCULAR | Status: DC | PRN
  Administered 2018-03-11: 4 mg via INTRAVENOUS

## 2018-03-11 MED ORDER — METOCLOPRAMIDE HCL 5 MG PO TABS: 5.0000 mg | ORAL_TABLET | Freq: Three times a day (TID) | ORAL | Status: DC | PRN

## 2018-03-11 MED ORDER — HYDROCODONE-ACETAMINOPHEN 7.5-325 MG PO TABS
1.0000 | ORAL_TABLET | ORAL | Status: DC | PRN
  Filled 2018-03-11: qty 1

## 2018-03-11 MED ORDER — ONDANSETRON HCL 4 MG/2ML IJ SOLN: 4.0000 mg | Freq: Four times a day (QID) | INTRAMUSCULAR | Status: DC | PRN

## 2018-03-11 MED ORDER — LEVOTHYROXINE SODIUM 75 MCG PO TABS
75.0000 ug | ORAL_TABLET | Freq: Every day | ORAL | Status: DC
  Administered 2018-03-12: 75 ug via ORAL
  Filled 2018-03-11: qty 1

## 2018-03-11 MED ORDER — BUPIVACAINE IN DEXTROSE 0.75-8.25 % IT SOLN
INTRATHECAL | Status: DC | PRN
  Administered 2018-03-11: 2 mL via INTRATHECAL

## 2018-03-11 MED ORDER — METHOCARBAMOL 500 MG IVPB - SIMPLE MED
500.0000 mg | Freq: Four times a day (QID) | INTRAVENOUS | Status: DC | PRN
  Filled 2018-03-11: qty 50

## 2018-03-11 MED ORDER — TRANEXAMIC ACID 1000 MG/10ML IV SOLN
1000.0000 mg | Freq: Once | INTRAVENOUS | Status: AC
  Administered 2018-03-11: 1000 mg via INTRAVENOUS
  Filled 2018-03-11: qty 1000

## 2018-03-11 MED ORDER — TRAMADOL HCL 50 MG PO TABS: 50.0000 mg | ORAL_TABLET | Freq: Four times a day (QID) | ORAL | Status: DC | PRN

## 2018-03-11 MED ORDER — FENTANYL CITRATE (PF) 100 MCG/2ML IJ SOLN
INTRAMUSCULAR | Status: AC
  Filled 2018-03-11: qty 2

## 2018-03-11 MED ORDER — CEFAZOLIN SODIUM-DEXTROSE 2-4 GM/100ML-% IV SOLN
2.0000 g | INTRAVENOUS | Status: AC
  Administered 2018-03-11: 2 g via INTRAVENOUS
  Filled 2018-03-11: qty 100

## 2018-03-11 MED ORDER — DEXAMETHASONE SODIUM PHOSPHATE 10 MG/ML IJ SOLN: 8.0000 mg | Freq: Once | INTRAMUSCULAR | Status: DC

## 2018-03-11 MED ORDER — MIDAZOLAM HCL 2 MG/2ML IJ SOLN
INTRAMUSCULAR | Status: AC
  Filled 2018-03-11: qty 2

## 2018-03-11 MED ORDER — CEFAZOLIN SODIUM-DEXTROSE 2-4 GM/100ML-% IV SOLN
2.0000 g | Freq: Four times a day (QID) | INTRAVENOUS | Status: AC
  Administered 2018-03-11 (×2): 2 g via INTRAVENOUS
  Filled 2018-03-11 (×2): qty 100

## 2018-03-11 MED ORDER — LACTATED RINGERS IV SOLN
INTRAVENOUS | Status: DC
  Administered 2018-03-11 (×2): via INTRAVENOUS

## 2018-03-11 MED ORDER — SODIUM CHLORIDE 0.9 % IV SOLN
INTRAVENOUS | Status: DC | PRN
  Administered 2018-03-11: 40 ug/min via INTRAVENOUS

## 2018-03-11 MED ORDER — BISACODYL 10 MG RE SUPP: 10.0000 mg | Freq: Every day | RECTAL | Status: DC | PRN

## 2018-03-11 MED ORDER — CHLORHEXIDINE GLUCONATE 4 % EX LIQD: 60.0000 mL | Freq: Once | CUTANEOUS | Status: DC

## 2018-03-11 MED ORDER — EPHEDRINE 5 MG/ML INJ
INTRAVENOUS | Status: AC
  Filled 2018-03-11: qty 10

## 2018-03-11 MED ORDER — ASPIRIN EC 325 MG PO TBEC
325.0000 mg | DELAYED_RELEASE_TABLET | Freq: Two times a day (BID) | ORAL | Status: DC
  Administered 2018-03-12: 325 mg via ORAL
  Filled 2018-03-11: qty 1

## 2018-03-11 MED ORDER — ACETAMINOPHEN 10 MG/ML IV SOLN
1000.0000 mg | Freq: Four times a day (QID) | INTRAVENOUS | Status: DC
  Administered 2018-03-11: 1000 mg via INTRAVENOUS
  Filled 2018-03-11: qty 100

## 2018-03-11 MED ORDER — METOCLOPRAMIDE HCL 5 MG/ML IJ SOLN: 5.0000 mg | Freq: Three times a day (TID) | INTRAMUSCULAR | Status: DC | PRN

## 2018-03-11 MED ORDER — POLYETHYLENE GLYCOL 3350 17 G PO PACK: 17.0000 g | PACK | Freq: Every day | ORAL | Status: DC | PRN

## 2018-03-11 MED ORDER — GLYCOPYRROLATE 0.2 MG/ML IJ SOLN
INTRAMUSCULAR | Status: DC | PRN
  Administered 2018-03-11: 0.2 mg via INTRAVENOUS

## 2018-03-11 MED ORDER — STERILE WATER FOR IRRIGATION IR SOLN
Status: DC | PRN
  Administered 2018-03-11: 2000 mL

## 2018-03-11 MED ORDER — PROPOFOL 500 MG/50ML IV EMUL
INTRAVENOUS | Status: DC | PRN
  Administered 2018-03-11: 40 ug/kg/min via INTRAVENOUS

## 2018-03-11 MED ORDER — METHOCARBAMOL 500 MG PO TABS
500.0000 mg | ORAL_TABLET | Freq: Four times a day (QID) | ORAL | Status: DC | PRN
  Administered 2018-03-11: 500 mg via ORAL
  Filled 2018-03-11 (×3): qty 1

## 2018-03-11 MED ORDER — BUPIVACAINE-EPINEPHRINE (PF) 0.25% -1:200000 IJ SOLN
INTRAMUSCULAR | Status: AC
  Filled 2018-03-11: qty 30

## 2018-03-11 MED ORDER — FLEET ENEMA 7-19 GM/118ML RE ENEM: 1.0000 | ENEMA | Freq: Once | RECTAL | Status: DC | PRN

## 2018-03-11 MED ORDER — MENTHOL 3 MG MT LOZG: 1.0000 | LOZENGE | OROMUCOSAL | Status: DC | PRN

## 2018-03-11 MED ORDER — MORPHINE SULFATE (PF) 2 MG/ML IV SOLN: 0.5000 mg | INTRAVENOUS | Status: DC | PRN

## 2018-03-11 MED ORDER — 0.9 % SODIUM CHLORIDE (POUR BTL) OPTIME
TOPICAL | Status: DC | PRN
  Administered 2018-03-11: 1000 mL

## 2018-03-11 MED ORDER — FENTANYL CITRATE (PF) 100 MCG/2ML IJ SOLN
INTRAMUSCULAR | Status: DC | PRN
  Administered 2018-03-11: 50 ug via INTRAVENOUS

## 2018-03-11 MED ORDER — ONDANSETRON HCL 4 MG/2ML IJ SOLN
INTRAMUSCULAR | Status: AC
  Filled 2018-03-11: qty 2

## 2018-03-11 MED ORDER — DOCUSATE SODIUM 100 MG PO CAPS
100.0000 mg | ORAL_CAPSULE | Freq: Two times a day (BID) | ORAL | Status: DC
  Administered 2018-03-11 – 2018-03-12 (×2): 100 mg via ORAL
  Filled 2018-03-11 (×2): qty 1

## 2018-03-11 MED ORDER — TRANEXAMIC ACID 1000 MG/10ML IV SOLN
1000.0000 mg | INTRAVENOUS | Status: AC
  Administered 2018-03-11: 1000 mg via INTRAVENOUS
  Filled 2018-03-11: qty 10

## 2018-03-11 SURGICAL SUPPLY — 49 items
BAG DECANTER FOR FLEXI CONT (MISCELLANEOUS) ×1 IMPLANT
BAG SPEC THK2 15X12 ZIP CLS (MISCELLANEOUS)
BAG ZIPLOCK 12X15 (MISCELLANEOUS) IMPLANT
BLADE SAG 18X100X1.27 (BLADE) ×3 IMPLANT
CLOSURE WOUND 1/2 X4 (GAUZE/BANDAGES/DRESSINGS) ×1
COVER PERINEAL POST (MISCELLANEOUS) ×3 IMPLANT
COVER SURGICAL LIGHT HANDLE (MISCELLANEOUS) ×3 IMPLANT
CUP ACET PINNACLE SECTR 50MM (Hips) IMPLANT
DECANTER SPIKE VIAL GLASS SM (MISCELLANEOUS) ×3 IMPLANT
DRAPE STERI IOBAN 125X83 (DRAPES) ×3 IMPLANT
DRAPE U-SHAPE 47X51 STRL (DRAPES) ×6 IMPLANT
DRSG ADAPTIC 3X8 NADH LF (GAUZE/BANDAGES/DRESSINGS) ×3 IMPLANT
DRSG MEPILEX BORDER 4X4 (GAUZE/BANDAGES/DRESSINGS) ×3 IMPLANT
DRSG MEPILEX BORDER 4X8 (GAUZE/BANDAGES/DRESSINGS) ×3 IMPLANT
DURAPREP 26ML APPLICATOR (WOUND CARE) ×3 IMPLANT
ELECT REM PT RETURN 15FT ADLT (MISCELLANEOUS) ×3 IMPLANT
EVACUATOR 1/8 PVC DRAIN (DRAIN) ×3 IMPLANT
FEM STEM 12/14 TAPER SZ 4 HIP (Orthopedic Implant) ×3 IMPLANT
FEMORAL STEM 12/14 TPR SZ4 HIP (Orthopedic Implant) IMPLANT
GLOVE BIO SURGEON STRL SZ7 (GLOVE) ×1 IMPLANT
GLOVE BIO SURGEON STRL SZ8 (GLOVE) ×3 IMPLANT
GLOVE BIOGEL PI IND STRL 6.5 (GLOVE) IMPLANT
GLOVE BIOGEL PI IND STRL 7.0 (GLOVE) ×1 IMPLANT
GLOVE BIOGEL PI IND STRL 7.5 (GLOVE) IMPLANT
GLOVE BIOGEL PI IND STRL 8 (GLOVE) ×1 IMPLANT
GLOVE BIOGEL PI INDICATOR 6.5 (GLOVE) ×2
GLOVE BIOGEL PI INDICATOR 7.0 (GLOVE) ×2
GLOVE BIOGEL PI INDICATOR 7.5 (GLOVE) ×8
GLOVE BIOGEL PI INDICATOR 8 (GLOVE) ×2
GLOVE SURG SS PI 6.5 STRL IVOR (GLOVE) ×2 IMPLANT
GOWN STRL REUS W/TWL LRG LVL3 (GOWN DISPOSABLE) ×5 IMPLANT
GOWN STRL REUS W/TWL XL LVL3 (GOWN DISPOSABLE) ×5 IMPLANT
HEAD FEMORAL 32 CERAMIC (Hips) ×2 IMPLANT
HOLDER FOLEY CATH W/STRAP (MISCELLANEOUS) ×3 IMPLANT
LINER MARATHON 32 50 (Hips) ×2 IMPLANT
MANIFOLD NEPTUNE II (INSTRUMENTS) ×3 IMPLANT
PACK ANTERIOR HIP CUSTOM (KITS) ×3 IMPLANT
PINNACLE SECTOR CUP 50MM (Hips) ×3 IMPLANT
STRIP CLOSURE SKIN 1/2X4 (GAUZE/BANDAGES/DRESSINGS) ×2 IMPLANT
SUT ETHIBOND NAB CT1 #1 30IN (SUTURE) ×3 IMPLANT
SUT MNCRL AB 4-0 PS2 18 (SUTURE) ×3 IMPLANT
SUT STRATAFIX 0 PDS 27 VIOLET (SUTURE) ×3
SUT VIC AB 2-0 CT1 27 (SUTURE) ×6
SUT VIC AB 2-0 CT1 TAPERPNT 27 (SUTURE) ×2 IMPLANT
SUTURE STRATFX 0 PDS 27 VIOLET (SUTURE) ×1 IMPLANT
SYR 50ML LL SCALE MARK (SYRINGE) IMPLANT
TRAY FOLEY CATH 14FRSI W/METER (CATHETERS) ×2 IMPLANT
TRAY FOLEY MTR SLVR 16FR STAT (SET/KITS/TRAYS/PACK) ×1 IMPLANT
YANKAUER SUCT BULB TIP 10FT TU (MISCELLANEOUS) ×3 IMPLANT

## 2018-03-11 NOTE — Anesthesia Procedure Notes (Signed)
Spinal  Patient location during procedure: OR Start time: 03/11/2018 11:15 AM End time: 03/11/2018 11:31 AM Staffing Anesthesiologist: Belinda Block, MD Performed: anesthesiologist  Preanesthetic Checklist Completed: patient identified, site marked, surgical consent, pre-op evaluation, timeout performed, IV checked, risks and benefits discussed and monitors and equipment checked Spinal Block Patient position: sitting Prep: DuraPrep Patient monitoring: cardiac monitor, continuous pulse ox and blood pressure Approach: right paramedian Location: L3-4 Injection technique: single-shot Needle Needle type: [place]  Assessment Sensory level: T10 Additional Notes Clear CSF. Spinal marcaine 0.75% 2cc neg asp Patient tolerated procedure well.

## 2018-03-11 NOTE — Transfer of Care (Signed)
Immediate Anesthesia Transfer of Care Note  Patient: Adrienne [name]  Procedure(s) Performed: RIGHT TOTAL HIP ARTHROPLASTY ANTERIOR APPROACH (Right Hip)  Patient Location: PACU  Anesthesia Type:Spinal  Level of Consciousness: awake, alert  and oriented  Airway & Oxygen Therapy: Patient Spontanous Breathing and Patient connected to face mask oxygen  Post-op Assessment: Report given to RN and Post -op Vital signs reviewed and stable  Post vital signs: Reviewed and stable  Last Vitals:  Vitals Value Taken Time  BP 111/57 03/11/2018 12:48 PM  Temp    Pulse 48 03/11/2018 12:50 PM  Resp 18 03/11/2018 12:50 PM  SpO2 100 % 03/11/2018 12:50 PM  Vitals shown include unvalidated device data.  Last Pain:  Vitals:   03/11/18 0855  TempSrc: Oral         Complications: No apparent anesthesia complications

## 2018-03-11 NOTE — Anesthesia Postprocedure Evaluation (Signed)
Anesthesia Post Note  Patient: Adrienne [name]  Procedure(s) Performed: RIGHT TOTAL HIP ARTHROPLASTY ANTERIOR APPROACH (Right Hip)     Patient location during evaluation: PACU Anesthesia Type: Spinal Level of consciousness: awake Pain management: pain level controlled Vital Signs Assessment: post-procedure vital signs reviewed and stable Respiratory status: spontaneous breathing Cardiovascular status: stable Postop Assessment: no apparent nausea or vomiting Anesthetic complications: no    Last Vitals:  Vitals:   03/11/18 1417 03/11/18 1449  BP: 132/86 127/80  Pulse: (!) 45 (!) 48  Resp: 14 14  Temp: (!) 36.4 C 36.5 C  SpO2: 99% 100%    Last Pain:  Vitals:   03/11/18 1449  TempSrc: Oral  PainSc:                  Marycatherine Maniscalco

## 2018-03-11 NOTE — Evaluation (Signed)
Physical Therapy Evaluation Patient Details Name: Adrienne [name] MRN: [id number] DOB: 06-Oct-1945 Today's Date: 03/11/2018   History of Present Illness  72 YO female s/p R DA-THA on 9/18. PMH includes OA, pre-DM, cataracts, glaucoma, SBO, HTN, CHF, L DA-THA 4/19, back surgery.   Clinical Impression   Pt s/p R DA-THA on 9/18. Pt presents with difficulty performing bed mobility/transfers, R hip pain, and decreased activity tolerance for ambulation. Pt to benefit from acute PT to address deficits. Pt ambulated in hallway with RW with min guard assist today. Pt with some nausea during the session, BP WNL. Pt's pulse was low throughout the session, running from mid-40s to 60 bpm. RN notified. Will continue to follow acutely.     Follow Up Recommendations Follow surgeon's recommendation for DC plan and follow-up therapies;Supervision for mobility/OOB(HHPT )    Equipment Recommendations  None recommended by PT    Recommendations for Other Services       Precautions / Restrictions Precautions Precautions: Fall Restrictions Weight Bearing Restrictions: No Other Position/Activity Restrictions: WBAT       Mobility  Bed Mobility Overal bed mobility: Needs Assistance Bed Mobility: Supine to Sit     Supine to sit: Min assist     General bed mobility comments: Assist for RLE management, increased time to scoot to EOB.   Transfers Overall transfer level: Needs assistance Equipment used: Rolling walker (2 wheeled) Transfers: Sit to/from Stand Sit to Stand: Min assist         General transfer comment: Verbal cuing for hand placement, min assist for steadying upon standing. Pt stating she felt nauseous and slightly dizzy upon standing, so took BP. BP 123/68 with pulse of 56 bpm. Pt with mild nausea throughout the session.   Ambulation/Gait Ambulation/Gait assistance: Min guard Gait Distance (Feet): 100 Feet Assistive device: Rolling walker (2 wheeled) Gait Pattern/deviations:  Step-to pattern;Step-through pattern;Decreased stride length;Decreased weight shift to right;Antalgic Gait velocity: normal    General Gait Details: Min guard for safety. Verbal cuing for sequencing.   Stairs            Wheelchair Mobility    Modified Rankin (Stroke Patients Only)       Balance Overall balance assessment: Mild deficits observed, not formally tested                                           Pertinent Vitals/Pain Pain Assessment: 0-10 Pain Score: 4  Pain Location: R hip  Pain Descriptors / Indicators: Sore;Operative site guarding Pain Intervention(s): Limited activity within patient's tolerance;Repositioned;Ice applied;Monitored during session    Home Living Family/patient expects to be discharged to:: Private residence Living Arrangements: Children;Other relatives Available Help at Discharge: Family;Available PRN/intermittently Type of Home: House Home Access: Stairs to enter Entrance Stairs-Rails: None Entrance Stairs-Number of Steps: 3 Home Layout: One level Home Equipment: Cane - single point;Toilet riser;Walker - 2 wheels;Bedside commode      Prior Function Level of Independence: Independent with assistive device(s)         Comments: using cane for mobility, able to do all ADLs independently      Hand Dominance   Dominant Hand: Right    Extremity/Trunk Assessment   Upper Extremity Assessment Upper Extremity Assessment: Overall WFL for tasks assessed    Lower Extremity Assessment Lower Extremity Assessment: Overall WFL for tasks assessed;RLE deficits/detail RLE Deficits / Details: Suspected hip weakness secondary to  surgery; able to perform quad sets x2, assisted heel slide, ankle pumps.  RLE Sensation: WNL    Cervical / Trunk Assessment Cervical / Trunk Assessment: Normal  Communication   Communication: No difficulties  Cognition Arousal/Alertness: Awake/alert Behavior During Therapy: WFL for tasks  assessed/performed Overall Cognitive Status: Within Functional Limits for tasks assessed                                        General Comments      Exercises Total Joint Exercises Ankle Circles/Pumps: AROM;Both;10 reps;Supine   Assessment/Plan    PT Assessment Patient needs continued PT services  PT Problem List Decreased strength;Pain;Decreased range of motion;Decreased activity tolerance;Decreased knowledge of use of DME;Decreased balance;Decreased mobility       PT Treatment Interventions DME instruction;Therapeutic activities;Gait training;Therapeutic exercise;Patient/family education;Stair training;Balance training;Functional mobility training    PT Goals (Current goals can be found in the Care Plan section)  Acute Rehab PT Goals PT Goal Formulation: With patient Time For Goal Achievement: 03/25/18 Potential to Achieve Goals: Good    Frequency     Barriers to discharge        Co-evaluation               AM-PAC PT "6 Clicks" Daily Activity  Outcome Measure Difficulty turning over in bed (including adjusting bedclothes, sheets and blankets)?: Unable Difficulty moving from lying on back to sitting on the side of the bed? : Unable Difficulty sitting down on and standing up from a chair with arms (e.g., wheelchair, bedside commode, etc,.)?: Unable Help needed moving to and from a bed to chair (including a wheelchair)?: A Little Help needed walking in hospital room?: A Little Help needed climbing 3-5 steps with a railing? : A Little 6 Click Score: 12    End of Session Equipment Utilized During Treatment: Gait belt Activity Tolerance: Patient tolerated treatment well;Patient limited by pain Patient left: in chair;with chair alarm set;with call bell/phone within reach;with family/visitor present Nurse Communication: Mobility status PT Visit Diagnosis: Difficulty in walking, not elsewhere classified (R26.2);Other abnormalities of gait and mobility  (R26.89)    Time: 2334-3568 PT Time Calculation (min) (ACUTE ONLY): 30 min   Charges:   PT Evaluation $PT Eval Low Complexity: 1 Low PT Treatments $Gait Training: 8-22 mins       Julien Girt, PT Acute Rehabilitation Services Pager [phone]  Office [phone]   Adrienne [name] 03/11/2018, 5:34 PM

## 2018-03-11 NOTE — Anesthesia Preprocedure Evaluation (Addendum)
Anesthesia Evaluation  Patient identified by MRN, date of birth, ID band Patient awake    Reviewed: Allergy & Precautions, NPO status , Patient's Chart, lab work & pertinent test results  Airway Mallampati: II  TM Distance: >3 FB     Dental   Pulmonary sleep apnea , former smoker,    breath sounds clear to auscultation       Cardiovascular hypertension, +CHF  + dysrhythmias  Rhythm:Regular Rate:Normal     Neuro/Psych  Headaches,    GI/Hepatic negative GI ROS,   Endo/Other  Hypothyroidism Pre diabtes  Renal/GU Renal disease     Musculoskeletal  (+) Arthritis ,   Abdominal   Peds  Hematology   Anesthesia Other Findings   Reproductive/Obstetrics                             Anesthesia Physical Anesthesia Plan  ASA: III  Anesthesia Plan: Spinal   Post-op Pain Management:    Induction: Intravenous  PONV Risk Score and Plan: Ondansetron, Dexamethasone and Midazolam  Airway Management Planned: Nasal Cannula and Simple Face Mask  Additional Equipment:   Intra-op Plan:   Post-operative Plan:   Informed Consent:   Dental advisory given  Plan Discussed with: Anesthesiologist and CRNA  Anesthesia Plan Comments:         Anesthesia Quick Evaluation

## 2018-03-11 NOTE — Interval H&P Note (Signed)
History and Physical Interval Note:  03/11/2018 10:04 AM  [name]  has presented today for surgery, with the diagnosis of right hip osteoarthritis  The various methods of treatment have been discussed with the patient and family. After consideration of risks, benefits and other options for treatment, the patient has consented to  Procedure(s) with comments: [place] (Right) - 1102min as a surgical intervention .  The patient's history has been reviewed, patient examined, no change in status, stable for surgery.  I have reviewed the patient's chart and labs.  Questions were answered to the patient's satisfaction.     Pilar Plate Dane Kopke

## 2018-03-11 NOTE — Discharge Instructions (Signed)
Dr. [name] Total Joint Specialist [facility] [address], [place], [place] [address] [phone]  ANTERIOR APPROACH TOTAL HIP REPLACEMENT POSTOPERATIVE DIRECTIONS   Hip Rehabilitation, Guidelines Following Surgery  The results of a hip operation are greatly improved after range of motion and muscle strengthening exercises. Follow all safety measures which are given to protect your hip. If any of these exercises cause increased pain or swelling in your joint, decrease the amount until you are comfortable again. Then slowly increase the exercises. Call your caregiver if you have problems or questions.   HOME CARE INSTRUCTIONS  Remove items at home which could result in a fall. This includes throw rugs or furniture in walking pathways.   ICE to the affected hip every three hours for 30 minutes at a time and then as needed for pain and swelling.  Continue to use ice on the hip for pain and swelling from surgery. You may notice swelling that will progress down to the foot and ankle.  This is normal after surgery.  Elevate the leg when you are not up walking on it.    Continue to use the breathing machine which will help keep your temperature down.  It is common for your temperature to cycle up and down following surgery, especially at night when you are not up moving around and exerting yourself.  The breathing machine keeps your lungs expanded and your temperature down.   DIET You may resume your previous home diet once your are discharged from the hospital.  DRESSING / WOUND CARE / SHOWERING You may change your dressing every day with sterile gauze.  Please use good hand washing techniques before changing the dressing.  Do not use any lotions or creams on the incision until instructed by your surgeon. You may start showering once you are discharged home but do not submerge the incision under water. Just pat the incision dry and apply a dry gauze dressing on  daily. Change the surgical dressing daily and reapply a dry dressing each time.  ACTIVITY Walk with your walker as instructed. Use walker as long as suggested by your caregivers. Avoid periods of inactivity such as sitting longer than an hour when not asleep. This helps prevent blood clots.  You may resume a sexual relationship in one month or when given the OK by your doctor.  You may return to work once you are cleared by your doctor.  Do not drive a car for 6 weeks or until released by you surgeon.  Do not drive while taking narcotics.  WEIGHT BEARING Weight bearing as tolerated with assist device (walker, cane, etc) as directed, use it as long as suggested by your surgeon or therapist, typically at least 4-6 weeks.  POSTOPERATIVE CONSTIPATION PROTOCOL Constipation - defined medically as fewer than three stools per week and severe constipation as less than one stool per week.  One of the most common issues patients have following surgery is constipation.  Even if you have a regular bowel pattern at home, your normal regimen is likely to be disrupted due to multiple reasons following surgery.  Combination of anesthesia, postoperative narcotics, change in appetite and fluid intake all can affect your bowels.  In order to avoid complications following surgery, here are some recommendations in order to help you during your recovery period.  Colace (docusate) - Pick up an over-the-counter form of Colace or another stool softener and take twice a day as long as you are requiring postoperative pain  medications.  Take with a full glass of water daily.  If you experience loose stools or diarrhea, hold the colace until you stool forms back up.  If your symptoms do not get better within 1 week or if they get worse, check with your doctor.  Dulcolax (bisacodyl) - Pick up over-the-counter and take as directed by the product packaging as needed to assist with the movement of your bowels.  Take with a full  glass of water.  Use this product as needed if not relieved by Colace only.   MiraLax (polyethylene glycol) - Pick up over-the-counter to have on hand.  MiraLax is a solution that will increase the amount of water in your bowels to assist with bowel movements.  Take as directed and can mix with a glass of water, juice, soda, coffee, or tea.  Take if you go more than two days without a movement. Do not use MiraLax more than once per day. Call your doctor if you are still constipated or irregular after using this medication for 7 days in a row.  If you continue to have problems with postoperative constipation, please contact the office for further assistance and recommendations.  If you experience "the worst abdominal pain ever" or develop nausea or vomiting, please contact the office immediatly for further recommendations for treatment.  ITCHING  If you experience itching with your medications, try taking only a single pain pill, or even half a pain pill at a time.  You can also use Benadryl over the counter for itching or also to help with sleep.   TED HOSE STOCKINGS Wear the elastic stockings on both legs for three weeks following surgery during the day but you may remove then at night for sleeping.  MEDICATIONS See your medication summary on the After Visit Summary that the nursing staff will review with you prior to discharge.  You may have some home medications which will be placed on hold until you complete the course of blood thinner medication.  It is important for you to complete the blood thinner medication as prescribed by your surgeon.  Continue your approved medications as instructed at time of discharge.  PRECAUTIONS If you experience chest pain or shortness of breath - call 911 immediately for transfer to the hospital emergency department.  If you develop a fever greater that 101 F, purulent drainage from wound, increased redness or drainage from wound, foul odor from the  wound/dressing, or calf pain - CONTACT YOUR SURGEON.                                                   FOLLOW-UP APPOINTMENTS Make sure you keep all of your appointments after your operation with your surgeon and caregivers. You should call the office at the above phone number and make an appointment for approximately two weeks after the date of your surgery or on the date instructed by your surgeon outlined in the "After Visit Summary".  RANGE OF MOTION AND STRENGTHENING EXERCISES  These exercises are designed to help you keep full movement of your hip joint. Follow your caregiver's or physical therapist's instructions. Perform all exercises about fifteen times, three times per day or as directed. Exercise both hips, even if you have had only one joint replacement. These exercises can be done on a training (exercise) mat, on the floor, on  a table or on a bed. Use whatever works the best and is most comfortable for you. Use music or television while you are exercising so that the exercises are a pleasant break in your day. This will make your life better with the exercises acting as a break in routine you can look forward to.  Lying on your back, slowly slide your foot toward your buttocks, raising your knee up off the floor. Then slowly slide your foot back down until your leg is straight again.  Lying on your back spread your legs as far apart as you can without causing discomfort.  Lying on your side, raise your upper leg and foot straight up from the floor as far as is comfortable. Slowly lower the leg and repeat.  Lying on your back, tighten up the muscle in the front of your thigh (quadriceps muscles). You can do this by keeping your leg straight and trying to raise your heel off the floor. This helps strengthen the largest muscle supporting your knee.  Lying on your back, tighten up the muscles of your buttocks both with the legs straight and with the knee bent at a comfortable angle while keeping  your heel on the floor.   IF YOU ARE TRANSFERRED TO A SKILLED REHAB FACILITY If the patient is transferred to a skilled rehab facility following release from the hospital, a list of the current medications will be sent to the facility for the patient to continue.  When discharged from the skilled rehab facility, please have the facility set up the patient's [place] prior to being released. Also, the skilled facility will be responsible for providing the patient with their medications at time of release from the facility to include their pain medication, the muscle relaxants, and their blood thinner medication. If the patient is still at the rehab facility at time of the two week follow up appointment, the skilled rehab facility will also need to assist the patient in arranging follow up appointment in our office and any transportation needs.  MAKE SURE YOU:  Understand these instructions.  Get help right away if you are not doing well or get worse.    Pick up stool softner and laxative for home use following surgery while on pain medications. Do not submerge incision under water. Please use good hand washing techniques while changing dressing each day. May shower starting three days after surgery. Please use a clean towel to pat the incision dry following showers. Continue to use ice for pain and swelling after surgery. Do not use any lotions or creams on the incision until instructed by your surgeon.

## 2018-03-11 NOTE — Plan of Care (Signed)
Pt is stable. Pt educated on pain management. Pt tolerating well her medications. No acute distress.

## 2018-03-11 NOTE — Op Note (Signed)
OPERATIVE REPORT- TOTAL HIP ARTHROPLASTY   PREOPERATIVE DIAGNOSIS: Osteoarthritis of the Right hip.   POSTOPERATIVE DIAGNOSIS: Osteoarthritis of the Right  hip.   PROCEDURE: Right total hip arthroplasty, anterior approach.   SURGEON: [name], MD   ASSISTANT: Ardeen Jourdain, PA-C  ANESTHESIA:  Spinal  ESTIMATED BLOOD LOSS:-150 mL    DRAINS: Hemovac x1.   COMPLICATIONS: None   CONDITION: PACU - hemodynamically stable.   BRIEF CLINICAL NOTE: Adrienne [name] is a 72 y.o. female who has advanced end-  stage arthritis of their Right  hip with progressively worsening pain and  dysfunction.The patient has failed nonoperative management and presents for  total hip arthroplasty.   PROCEDURE IN DETAIL: After successful administration of spinal  anesthetic, the traction boots for the [facility] bed were placed on both  feet and the patient was placed onto the [facility] bed, boots placed into the leg  holders. The Right hip was then isolated from the perineum with plastic  drapes and prepped and draped in the usual sterile fashion. ASIS and  greater trochanter were marked and a oblique incision was made, starting  at about 1 cm lateral and 2 cm distal to the ASIS and coursing towards  the anterior cortex of the femur. The skin was cut with a 10 blade  through subcutaneous tissue to the level of the fascia overlying the  tensor fascia lata muscle. The fascia was then incised in line with the  incision at the junction of the anterior third and posterior 2/3rd. The  muscle was teased off the fascia and then the interval between the TFL  and the rectus was developed. The Hohmann retractor was then placed at  the top of the femoral neck over the capsule. The vessels overlying the  capsule were cauterized and the fat on top of the capsule was removed.  A Hohmann retractor was then placed anterior underneath the rectus  femoris to give exposure to the entire anterior capsule. A T-shaped   capsulotomy was performed. The edges were tagged and the femoral head  was identified.       Osteophytes are removed off the superior acetabulum.  The femoral neck was then cut in situ with an oscillating saw. Traction  was then applied to the left lower extremity utilizing the [facility]  traction. The femoral head was then removed. Retractors were placed  around the acetabulum and then circumferential removal of the labrum was  performed. Osteophytes were also removed. Reaming starts at 47 mm to  medialize and  Increased in 2 mm increments to 49 mm. We reamed in  approximately 40 degrees of abduction, 20 degrees anteversion. A 50 mm  pinnacle acetabular shell was then impacted in anatomic position under  fluoroscopic guidance with excellent purchase. We did not need to place  any additional dome screws. A 32 mm neutral + 4 marathon liner was then  placed into the acetabular shell.       The femoral lift was then placed along the lateral aspect of the femur  just distal to the vastus ridge. The leg was  externally rotated and capsule  was stripped off the inferior aspect of the femoral neck down to the  level of the lesser trochanter, this was done with electrocautery. The femur was lifted after this was performed. The  leg was then placed in an extended and adducted position essentially delivering the femur. We also removed the capsule superiorly and the piriformis from the piriformis  fossa to gain excellent exposure of the  proximal femur. Rongeur was used to remove some cancellous bone to get  into the lateral portion of the proximal femur for placement of the  initial starter reamer. The starter broaches was placed  the starter broach  and was shown to go down the center of the canal. Broaching  with the Actis system was then performed starting at size 0  coursing  Up to size 4. A size 4 had excellent torsional and rotational  and axial stability. The trial high offset neck was then placed   with a 32 + 1 trial head. The hip was then reduced. We confirmed that  the stem was in the canal both on AP and lateral x-rays. It also has excellent sizing. The hip was reduced with outstanding stability through full extension and full external rotation.. AP pelvis was taken and the leg lengths were measured and found to be equal. Hip was then dislocated again and the femoral head and neck removed. The  femoral broach was removed. Size 4 Actis stem with a high offset  neck was then impacted into the femur following native anteversion. Has  excellent purchase in the canal. Excellent torsional and rotational and  axial stability. It is confirmed to be in the canal on AP and lateral  fluoroscopic views. The 32 + 1 ceramic head was placed and the hip  reduced with outstanding stability. Again AP pelvis was taken and it  confirmed that the leg lengths were equal. The wound was then copiously  irrigated with saline solution and the capsule reattached and repaired  with Ethibond suture. 30 ml of .25% Bupivicaine was  injected into the capsule and into the edge of the tensor fascia lata as well as subcutaneous tissue. The fascia overlying the tensor fascia lata was then closed with a running #1 V-Loc. Subcu was closed with interrupted 2-0 Vicryl and subcuticular running 4-0 Monocryl. Incision was cleaned  and dried. Steri-Strips and a bulky sterile dressing applied. Hemovac  drain was hooked to suction and then the patient was awakened and transported to  recovery in stable condition.        Please note that a surgical assistant was a medical necessity for this procedure to perform it in a safe and expeditious manner. Assistant was necessary to provide appropriate retraction of vital neurovascular structures and to prevent femoral fracture and allow for anatomic placement of the prosthesis.  [name], M.D.

## 2018-03-11 NOTE — Addendum Note (Signed)
Addendum  created 03/11/18 1733 by Belinda Block, MD   Child order released for a procedure order, Intraprocedure Blocks edited, Intraprocedure Event edited, Sign clinical note

## 2018-03-12 ENCOUNTER — Encounter: Payer: PPO | Admitting: Gastroenterology

## 2018-03-12 ENCOUNTER — Encounter (HOSPITAL_COMMUNITY): Payer: Self-pay | Admitting: Orthopedic Surgery

## 2018-03-12 LAB — CBC
HCT: 36.4 % (ref 36.0–46.0)
Hemoglobin: 12.1 g/dL (ref 12.0–15.0)
MCH: 30 pg (ref 26.0–34.0)
MCHC: 33.2 g/dL (ref 30.0–36.0)
MCV: 90.3 fL (ref 78.0–100.0)
PLATELETS: 304 10*3/uL (ref 150–400)
RBC: 4.03 MIL/uL (ref 3.87–5.11)
RDW: 13.8 % (ref 11.5–15.5)
WBC: 12.9 10*3/uL — ABNORMAL HIGH (ref 4.0–10.5)

## 2018-03-12 LAB — BASIC METABOLIC PANEL
ANION GAP: 9 (ref 5–15)
BUN: 14 mg/dL (ref 8–23)
CO2: 25 mmol/L (ref 22–32)
Calcium: 9.1 mg/dL (ref 8.9–10.3)
Chloride: 105 mmol/L (ref 98–111)
Creatinine, Ser: 0.87 mg/dL (ref 0.44–1.00)
GFR calc Af Amer: >60 mL/min (ref >60)
Glucose, Bld: 155 mg/dL — ABNORMAL HIGH (ref 70–99)
POTASSIUM: 3.6 mmol/L (ref 3.5–5.1)
Sodium: 139 mmol/L (ref 135–145)

## 2018-03-12 MED ORDER — HYDROCODONE-ACETAMINOPHEN 5-325 MG PO TABS: 1.0000 | ORAL_TABLET | Freq: Four times a day (QID) | ORAL | 0 refills | Status: DC | PRN

## 2018-03-12 MED ORDER — ASPIRIN 325 MG PO TBEC: 325.0000 mg | DELAYED_RELEASE_TABLET | Freq: Two times a day (BID) | ORAL | 0 refills | Status: DC

## 2018-03-12 MED ORDER — METHOCARBAMOL 500 MG PO TABS: 500.0000 mg | ORAL_TABLET | Freq: Four times a day (QID) | ORAL | 0 refills | Status: DC | PRN

## 2018-03-12 MED ORDER — TRAMADOL HCL 50 MG PO TABS: 50.0000 mg | ORAL_TABLET | Freq: Four times a day (QID) | ORAL | 0 refills | Status: DC | PRN

## 2018-03-12 NOTE — Progress Notes (Signed)
Physical Therapy Treatment Patient Details Name: Adrienne [name] MRN: [id number] DOB: 1945/07/31 Today's Date: 03/12/2018    History of Present Illness 72 YO female s/p R DA-THA on 9/18. PMH includes OA, pre-DM, cataracts, glaucoma, SBO, HTN, CHF, L DA-THA 4/19, back surgery.     PT Comments    POD # 1 pm session Pt OOB in recliner.  Assisted with completing TE's following HEP handout followed by ICE.    Follow Up Recommendations  Follow surgeon's recommendation for DC plan and follow-up therapies;Supervision for mobility/OOB;Home health PT     Equipment Recommendations  None recommended by PT    Recommendations for Other Services       Precautions / Restrictions Precautions Precautions: Fall Restrictions Weight Bearing Restrictions: No Other Position/Activity Restrictions: WBAT     Mobility  Bed Mobility     General bed mobility comments: OOB in recliner  Transfers Overall transfer level: Needs assistance Equipment used: Rolling walker (2 wheeled) Transfers: Sit to/from [employer] Transfers Sit to Stand: Supervision Stand pivot transfers: Supervision;Min guard       General transfer comment: <25% VC's on safety with turns and proper hand placement esp with stand to sit to control decend  Ambulation/Gait   General Gait Details: did not amb this session as focus was on finishing TE's   Stairs             Wheelchair Mobility    Modified Rankin (Stroke Patients Only)       Balance                                            Cognition Arousal/Alertness: Awake/alert Behavior During Therapy: WFL for tasks assessed/performed Overall Cognitive Status: Within Functional Limits for tasks assessed                                        Exercises   10 reps all standing TE's   General Comments        Pertinent Vitals/Pain Pain Assessment: 0-10 Pain Score: 6  Pain Location: R hip  Pain Descriptors /  Indicators: Sore;Operative site guarding Pain Intervention(s): Monitored during session;Repositioned;Ice applied    Home Living                      Prior Function            PT Goals (current goals can now be found in the care plan section) Progress towards PT goals: Progressing toward goals    Frequency           PT Plan Current plan remains appropriate    Co-evaluation              AM-PAC PT "6 Clicks" Daily Activity  Outcome Measure  Difficulty turning over in bed (including adjusting bedclothes, sheets and blankets)?: A Lot Difficulty moving from lying on back to sitting on the side of the bed? : A Lot Difficulty sitting down on and standing up from a chair with arms (e.g., wheelchair, bedside commode, etc,.)?: A Lot Help needed moving to and from a bed to chair (including a wheelchair)?: A Little Help needed walking in hospital room?: A Little Help needed climbing 3-5 steps with a railing? : A Little 6 Click Score: 15  End of Session Equipment Utilized During Treatment: Gait belt Activity Tolerance: Patient tolerated treatment well;Patient limited by pain Patient left: in chair;with chair alarm set;with call bell/phone within reach;with family/visitor present Nurse Communication: Mobility status PT Visit Diagnosis: Difficulty in walking, not elsewhere classified (R26.2);Other abnormalities of gait and mobility (R26.89)     Time: 1355-1405 PT Time Calculation (min) (ACUTE ONLY): 10 min  Charges:  $Therapeutic Exercise: 8-22 mins                     Rica Koyanagi  PTA Acute  Rehabilitation Services Pager      [phone] Office      [phone]

## 2018-03-12 NOTE — Care Management Note (Signed)
Case Management Note  Patient Details  Name: Adrienne [name] MRN: [id number] Date of Birth: 21-Jan-1946  Subjective/Objective:      Discharge planning, spoke with patient and spouse at bedside. Have chosen [facility] for [facility] PT, evaluate and treat.              Action/Plan: Contacted [facility] for referral. Has a RW and 3n1. [phone]   Expected Discharge Date:  03/12/18               Expected Discharge Plan:  [place]  In-House Referral:  NA  Discharge planning Services  CM Consult  Post Acute Care Choice:  Home Health Choice offered to:  Patient  DME Arranged:  N/A DME Agency:  NA  HH Arranged:  PT [place] Agency:  [facility] (formerly [employer])  Status of Service:  Completed, signed off  If discussed at [employer], dates discussed:    Additional Comments:  Guadalupe Maple, RN 03/12/2018, 11:28 AM

## 2018-03-12 NOTE — Progress Notes (Signed)
Physical Therapy Treatment Patient Details Name: Adrienne [name] MRN: [id number] DOB: [birth year] Today's Date: 03/12/2018    History of Present Illness 72 YO female s/p R DA-THA on 9/18. PMH includes OA, pre-DM, cataracts, glaucoma, SBO, HTN, CHF, L DA-THA 4/19, back surgery.     PT Comments    POD # 1 am session Assisted with amb in hallway and performed some THR TE's following HEP handout.  Instructed on proper tech, freq as well as use of ICE.   Follow Up Recommendations  Follow surgeon's recommendation for DC plan and follow-up therapies;Supervision for mobility/OOB;Home health PT     Equipment Recommendations  None recommended by PT    Recommendations for Other Services       Precautions / Restrictions Precautions Precautions: Fall Restrictions Weight Bearing Restrictions: No Other Position/Activity Restrictions: WBAT     Mobility  Bed Mobility Overal bed mobility: Needs Assistance Bed Mobility: Supine to Sit     Supine to sit: Min assist     General bed mobility comments: Assist for RLE management, increased time to scoot to EOB.   Transfers Overall transfer level: Needs assistance Equipment used: Rolling walker (2 wheeled) Transfers: Sit to/from [employer] Sit to Stand: Min guard;Supervision Stand pivot transfers: Supervision;Min guard       General transfer comment: 25% VC's on safety with turns and proper hand placement esp with stand to sit to control decend  Ambulation/Gait Ambulation/Gait assistance: Supervision;Min guard Gait Distance (Feet): 45 Feet Assistive device: Rolling walker (2 wheeled) Gait Pattern/deviations: Step-to pattern;Step-through pattern;Decreased stride length;Decreased weight shift to right;Antalgic Gait velocity: decreased    General Gait Details: Min guard for safety. Verbal cuing for sequencing.  decreased distance due to increased c/o fatigue   Stairs             Wheelchair Mobility     Modified Rankin (Stroke Patients Only)       Balance                                            Cognition Arousal/Alertness: Awake/alert Behavior During Therapy: WFL for tasks assessed/performed Overall Cognitive Status: Within Functional Limits for tasks assessed                                        Exercises   Total Hip Replacement TE's 10 reps ankle pumps 10 reps knee presses 10 reps heel slides 10 reps SAQ's 10 reps ABD Followed by ICE     General Comments        Pertinent Vitals/Pain Pain Assessment: 0-10 Pain Score: 6  Pain Location: R hip  Pain Descriptors / Indicators: Sore;Operative site guarding Pain Intervention(s): Monitored during session;Repositioned;Ice applied    Home Living                      Prior Function            PT Goals (current goals can now be found in the care plan section) Progress towards PT goals: Progressing toward goals    Frequency           PT Plan Current plan remains appropriate    Co-evaluation              AM-PAC PT "6 Clicks" Daily  Activity  Outcome Measure  Difficulty turning over in bed (including adjusting bedclothes, sheets and blankets)?: A Lot Difficulty moving from lying on back to sitting on the side of the bed? : A Lot Difficulty sitting down on and standing up from a chair with arms (e.g., wheelchair, bedside commode, etc,.)?: A Lot Help needed moving to and from a bed to chair (including a wheelchair)?: A Little Help needed walking in hospital room?: A Little Help needed climbing 3-5 steps with a railing? : A Little 6 Click Score: 15    End of Session Equipment Utilized During Treatment: Gait belt Activity Tolerance: Patient tolerated treatment well;Patient limited by pain Patient left: in chair;with chair alarm set;with call bell/phone within reach;with family/visitor present Nurse Communication: Mobility status PT Visit Diagnosis:  Difficulty in walking, not elsewhere classified (R26.2);Other abnormalities of gait and mobility (R26.89)     Time: 1145-1210 PT Time Calculation (min) (ACUTE ONLY): 25 min  Charges:  $Gait Training: 8-22 mins $Therapeutic Exercise: 8-22 mins                     Rica Koyanagi  PTA Acute  Rehabilitation Services Pager      [phone] Office      [phone]

## 2018-03-12 NOTE — Progress Notes (Signed)
Subjective: 1 Day Post-Op Procedure(s) (LRB): RIGHT TOTAL HIP ARTHROPLASTY ANTERIOR APPROACH (Right) Patient reports pain as mild.   Patient seen in rounds by Dr. [name]. Patient is well, and has had no acute complaints or problems other than discomfort in her hip. She did well overnight, foley catheter removed this AM. Did well ambulating with therapy yesterday, will continue working with them today. Denies CP, SHOB, or calf pain.    Objective: Vital signs in last 24 hours: Temp:  [97.5 F (36.4 C)-98.1 F (36.7 C)] 98 F (36.7 C) (09/19 0521) Pulse Rate:  [40-54] 49 (09/19 0521) Resp:  [12-19] 17 (09/19 0521) BP: (106-136)/(57-93) 122/73 (09/19 0521) SpO2:  [98 %-100 %] 100 % (09/19 0521) Weight:  [84.6 kg] 84.6 kg (09/18 0907)  Intake/Output from previous day:  Intake/Output Summary (Last 24 hours) at 03/12/2018 0733 Last data filed at 03/12/2018 1610 Gross per 24 hour  Intake 3686.06 ml  Output 2565 ml  Net 1121.06 ml     Intake/Output this shift: No intake/output data recorded.  Labs: Recent Labs    03/12/18 0437  HGB 12.1   Recent Labs    03/12/18 0437  WBC 12.9*  RBC 4.03  HCT 36.4  PLT 304   Recent Labs    03/12/18 0437  NA 139  K 3.6  CL 105  CO2 25  BUN 14  CREATININE 0.87  GLUCOSE 155*  CALCIUM 9.1   Exam: General - Patient is Alert and Oriented Extremity - Neurologically intact Neurovascular intact Sensation intact distally Dorsiflexion/Plantar flexion intact Dressing - dressing C/D/I Motor Function - intact, moving foot and toes well on exam.   Past Medical History:  Diagnosis Date  . Ambulates with cane    straight cane  . Arthritis    knee, back  . CHF (congestive heart failure) (HCC)    EF 55-60%  pt. denies. Dr. did further test and could not confirm per pt.  Marland Kitchen Dysrhythmia    hx of - no current problems  . Headache    hx of migraines - no longer a problem  . History of blood transfusion 1986   w/ Hysterectomy surgery   . Hyperlipidemia    diet controlled - no meds  . Hypertension   . Hypothyroidism   . Knee pain   . Pelvic kidney    lower right pelvic kidney   . Pre-diabetes   . Prediabetes    diet controlled - no meds  . SBO (small bowel obstruction) ([place]) 10/2016   surgery   . SBO (small bowel obstruction) ([place]) 10/2016  . Shoulder pain   . Sleep apnea    Mild - no mask needed per sleep study  . Smoker    quit smoking 2018  . Thyroid disease   . Tinnitus    BOTH EARS    Assessment/Plan: 1 Day Post-Op Procedure(s) (LRB): RIGHT TOTAL HIP ARTHROPLASTY ANTERIOR APPROACH (Right) Active Problems:   OA (osteoarthritis) of hip  Estimated body mass index is 33.05 kg/m as calculated from the following:   Height as of this encounter: 5\' 3"  (1.6 m).   Weight as of this encounter: 84.6 kg. Advance diet Up with therapy D/C IV fluids  DVT Prophylaxis - Aspirin Weight bearing as tolerated. D/C O2 and pulse ox and try on room air. Hemovac pulled without difficulty, will continue therapy.   Plan is to go Home after hospital stay. Home health physical therapy arranged.  Plan for discharge this afternoon if meeting goals  with therapy. Follow up in the office with Dr. [name] in two weeks.   Theresa Duty, PA-C Orthopedic Surgery 03/12/2018, 7:33 AM

## 2018-03-13 DIAGNOSIS — Z96641 Presence of right artificial hip joint: Secondary | ICD-10-CM | POA: Diagnosis not present

## 2018-03-16 NOTE — Discharge Summary (Signed)
Physician Discharge Summary   Patient ID: Adrienne [name] MRN: [id number] DOB/AGE: 1946-04-04 72 y.o.  Admit date: 03/11/2018 Discharge date: 03/12/2018  Primary Diagnosis: Osteoarthritis, right hip   Admission Diagnoses:  Past Medical History:  Diagnosis Date  . Ambulates with cane    straight cane  . Arthritis    knee, back  . CHF (congestive heart failure) (HCC)    EF 55-60%  pt. denies. Dr. did further test and could not confirm per pt.  Marland Kitchen Dysrhythmia    hx of - no current problems  . Headache    hx of migraines - no longer a problem  . History of blood transfusion 1986   w/ Hysterectomy surgery  . Hyperlipidemia    diet controlled - no meds  . Hypertension   . Hypothyroidism   . Knee pain   . Pelvic kidney    lower right pelvic kidney   . Pre-diabetes   . Prediabetes    diet controlled - no meds  . SBO (small bowel obstruction) ([place]) 10/2016   surgery   . SBO (small bowel obstruction) ([place]) 10/2016  . Shoulder pain   . Sleep apnea    Mild - no mask needed per sleep study  . Smoker    quit smoking 2018  . Thyroid disease   . Tinnitus    BOTH EARS   Discharge Diagnoses:   Active Problems:   OA (osteoarthritis) of hip  Estimated body mass index is 33.05 kg/m as calculated from the following:   Height as of this encounter: '5\' 3"'  (1.6 m).   Weight as of this encounter: 84.6 kg.  Procedure:  Procedure(s) (LRB): RIGHT TOTAL HIP ARTHROPLASTY ANTERIOR APPROACH (Right)   Consults: None  HPI: Adrienne [name] is a 72 y.o. female who has advanced end-stage arthritis of their Right  hip with progressively worsening pain and dysfunction.The patient has failed nonoperative management and presents for total hip arthroplasty.   Laboratory Data: Admission on 03/11/2018, Discharged on 03/12/2018  Component Date Value Ref Range Status  . WBC 03/12/2018 12.9* 4.0 - 10.5 K/uL Final  . RBC 03/12/2018 4.03  3.87 - 5.11 MIL/uL Final  . Hemoglobin 03/12/2018 12.1  12.0  - 15.0 g/dL Final  . HCT 03/12/2018 36.4  36.0 - 46.0 % Final  . MCV 03/12/2018 90.3  78.0 - 100.0 fL Final  . MCH 03/12/2018 30.0  26.0 - 34.0 pg Final  . MCHC 03/12/2018 33.2  30.0 - 36.0 g/dL Final  . RDW 03/12/2018 13.8  11.5 - 15.5 % Final  . Platelets 03/12/2018 304  150 - 400 K/uL Final   Performed at [facility], [place] [address] [place], [place] [address]  . Sodium 03/12/2018 139  135 - 145 mmol/L Final  . Potassium 03/12/2018 3.6  3.5 - 5.1 mmol/L Final  . Chloride 03/12/2018 105  98 - 111 mmol/L Final  . CO2 03/12/2018 25  22 - 32 mmol/L Final  . Glucose, Bld 03/12/2018 155* 70 - 99 mg/dL Final  . BUN 03/12/2018 14  8 - 23 mg/dL Final  . Creatinine, Ser 03/12/2018 0.87  0.44 - 1.00 mg/dL Final  . Calcium 03/12/2018 9.1  8.9 - 10.3 mg/dL Final  . GFR calc non Af Amer 03/12/2018 >60  >60 mL/min Final  . GFR calc Af Amer 03/12/2018 >60  >60 mL/min Final   Comment: (NOTE) The eGFR has been calculated using the CKD EPI equation. This calculation has not been validated in  all clinical situations. eGFR's persistently <60 mL/min signify possible Chronic Kidney Disease.   Georgiann Hahn gap 03/12/2018 9  5 - 15 Final   Performed at [facility], [place] [address], [place], [place] [address]  Hospital Outpatient Visit on 03/05/2018  Component Date Value Ref Range Status  . MRSA, PCR 03/05/2018 NEGATIVE  NEGATIVE Final  . Staphylococcus aureus 03/05/2018 NEGATIVE  NEGATIVE Final   Comment: (NOTE) The Xpert SA Assay (FDA approved for NASAL specimens in patients 23 years of age and older), is one component of a comprehensive surveillance program. It is not intended to diagnose infection nor to guide or monitor treatment. Performed at [facility], [place] [address] [place], [place] [address]   . aPTT 03/05/2018 29  24 - 36 seconds Final   Performed at [facility], [place] [address] [place], [place] [address]  . WBC  03/05/2018 10.4  4.0 - 10.5 K/uL Final  . RBC 03/05/2018 4.28  3.87 - 5.11 MIL/uL Final  . Hemoglobin 03/05/2018 12.8  12.0 - 15.0 g/dL Final  . HCT 03/05/2018 39.0  36.0 - 46.0 % Final  . MCV 03/05/2018 91.1  78.0 - 100.0 fL Final  . MCH 03/05/2018 29.9  26.0 - 34.0 pg Final  . MCHC 03/05/2018 32.8  30.0 - 36.0 g/dL Final  . RDW 03/05/2018 14.3  11.5 - 15.5 % Final  . Platelets 03/05/2018 310  150 - 400 K/uL Final   Performed at [facility], [place] [address] [place], [place] [address]  . Sodium 03/05/2018 141  135 - 145 mmol/L Final  . Potassium 03/05/2018 3.7  3.5 - 5.1 mmol/L Final  . Chloride 03/05/2018 104  98 - 111 mmol/L Final  . CO2 03/05/2018 27  22 - 32 mmol/L Final  . Glucose, Bld 03/05/2018 136* 70 - 99 mg/dL Final  . BUN 03/05/2018 15  8 - 23 mg/dL Final  . Creatinine, Ser 03/05/2018 0.90  0.44 - 1.00 mg/dL Final  . Calcium 03/05/2018 9.3  8.9 - 10.3 mg/dL Final  . Total Protein 03/05/2018 7.5  6.5 - 8.1 g/dL Final  . Albumin 03/05/2018 3.7  3.5 - 5.0 g/dL Final  . AST 03/05/2018 26  15 - 41 U/L Final  . ALT 03/05/2018 15  0 - 44 U/L Final  . Alkaline Phosphatase 03/05/2018 68  38 - 126 U/L Final  . Total Bilirubin 03/05/2018 0.6  0.3 - 1.2 mg/dL Final  . GFR calc non Af Amer 03/05/2018 >60  >60 mL/min Final  . GFR calc Af Amer 03/05/2018 >60  >60 mL/min Final   Comment: (NOTE) The eGFR has been calculated using the CKD EPI equation. This calculation has not been validated in all clinical situations. eGFR's persistently <60 mL/min signify possible Chronic Kidney Disease.   Georgiann Hahn gap 03/05/2018 10  5 - 15 Final   Performed at [facility], [place] [address], [place], [place] [address]  . Prothrombin Time 03/05/2018 12.4  11.4 - 15.2 seconds Final  . INR 03/05/2018 0.93   Final   Performed at [facility], [place] [address], [place],  [address]  . ABO/RH(D) 03/05/2018 B POS   Final  . Antibody Screen  03/05/2018 NEG   Final  . Sample Expiration 03/05/2018 03/14/2018   Final  . Extend sample reason 03/05/2018    Final  Value:NO TRANSFUSIONS OR PREGNANCY IN THE PAST 3 MONTHS Performed at [facility] [address] [place], [place] [address]   . Hgb A1c MFr Bld 03/05/2018 6.4* 4.8 - 5.6 % Final   Comment: (NOTE) Pre diabetes:          5.7%-6.4% Diabetes:              >6.4% Glycemic control for   <7.0% adults with diabetes   . Mean Plasma Glucose 03/05/2018 136.98  mg/dL Final   Performed at [facility] [address], [place], [place] [address]     X-Rays:Dg Pelvis Portable  Result Date: 03/11/2018 CLINICAL DATA:  Status post right hip replacement EXAM: PORTABLE PELVIS 1-2 VIEWS COMPARISON:  10/01/2017 FINDINGS: Left hip replacement is again identified. Newly placed right hip replacement is seen with surgical drain in place. The pelvic ring is intact. No soft tissue changes are noted. IMPRESSION: Status post right hip replacement Electronically Signed   By: Inez Catalina M.D.   On: 03/11/2018 15:43   Dg C-arm 1-60 Min-no Report  Result Date: 03/11/2018 Fluoroscopy was utilized by the requesting physician.  No radiographic interpretation.    EKG: Orders placed or performed in visit on 08/20/17  . EKG 12-Lead     Hospital Course: Adrienne [name] is a 72 y.o. who was admitted to [facility]. They were brought to the operating room on 03/11/2018 and underwent Procedure(s): RIGHT TOTAL HIP ARTHROPLASTY ANTERIOR APPROACH.  Patient tolerated the procedure well and was later transferred to the recovery room and then to the orthopaedic floor for postoperative care. They were given PO and IV analgesics for pain control following their surgery. They were given 24 hours of postoperative antibiotics of  Anti-infectives (From admission, onward)   Start     Dose/Rate Route Frequency Ordered Stop   03/11/18 1730  ceFAZolin (ANCEF) IVPB 2g/100  mL premix     2 g 200 mL/hr over 30 Minutes Intravenous Every 6 hours 03/11/18 1445 03/11/18 2359   03/11/18 0900  ceFAZolin (ANCEF) IVPB 2g/100 mL premix     2 g 200 mL/hr over 30 Minutes Intravenous On call to O.R. 03/11/18 3810 03/11/18 1108     and started on DVT prophylaxis in the form of Aspirin.   PT and OT were ordered for total joint protocol. Discharge planning consulted to help with postop disposition and equipment needs.  Patient had a good night on the evening of surgery. They started to get up OOB with therapy on POD #0. Pt was seen during rounds and was ready to go home pending progress with therapy. Hemovac drain was pulled without difficulty. She worked with therapy on POD #1 and was meeting her goals. Pt was discharged to home later that day in stable condition.  Diet: Cardiac diet Activity: WBAT Follow-up: in 2 weeks with Dr. [name] Disposition: Home with HHPT Discharged Condition: stable   Discharge Instructions    Call MD / Call 911   Complete by:  As directed    If you experience chest pain or shortness of breath, CALL 911 and be transported to the hospital emergency room.  If you develope a fever above 101 F, pus (white drainage) or increased drainage or redness at the wound, or calf pain, call your surgeon's office.   Change dressing   Complete by:  As directed    You may change your dressing on Friday, then change the dressing daily with sterile 4 x 4  inch gauze dressing and paper tape.   Constipation Prevention   Complete by:  As directed    Drink plenty of fluids.  Prune juice may be helpful.  You may use a stool softener, such as Colace (over the counter) 100 mg twice a day.  Use MiraLax (over the counter) for constipation as needed.   Diet - low sodium heart healthy   Complete by:  As directed    Discharge instructions   Complete by:  As directed    Dr. [name] Total Joint Specialist [facility] [address] [place], [place]  [id number] [phone]  ANTERIOR APPROACH TOTAL HIP REPLACEMENT POSTOPERATIVE DIRECTIONS   Hip Rehabilitation, Guidelines Following Surgery  The results of a hip operation are greatly improved after range of motion and muscle strengthening exercises. Follow all safety measures which are given to protect your hip. If any of these exercises cause increased pain or swelling in your joint, decrease the amount until you are comfortable again. Then slowly increase the exercises. Call your caregiver if you have problems or questions.   HOME CARE INSTRUCTIONS  Remove items at home which could result in a fall. This includes throw rugs or furniture in walking pathways.  ICE to the affected hip every three hours for 30 minutes at a time and then as needed for pain and swelling.  Continue to use ice on the hip for pain and swelling from surgery. You may notice swelling that will progress down to the foot and ankle.  This is normal after surgery.  Elevate the leg when you are not up walking on it.   Continue to use the breathing machine which will help keep your temperature down.  It is common for your temperature to cycle up and down following surgery, especially at night when you are not up moving around and exerting yourself.  The breathing machine keeps your lungs expanded and your temperature down.  DIET You may resume your previous home diet once your are discharged from the hospital.  DRESSING / WOUND CARE / SHOWERING You may shower 3 days after surgery, but keep the wounds dry during showering.  You may use an occlusive plastic wrap (Press'n Seal for example), NO SOAKING/SUBMERGING IN THE BATHTUB.  If the bandage gets wet, change with a clean dry gauze.  If the incision gets wet, pat the wound dry with a clean towel. You may start showering once you are discharged home but do not submerge the incision under water. Just pat the incision dry and apply a dry gauze dressing on daily. Change the surgical  dressing daily and reapply a dry dressing each time.  ACTIVITY Walk with your walker as instructed. Use walker as long as suggested by your caregivers. Avoid periods of inactivity such as sitting longer than an hour when not asleep. This helps prevent blood clots.  You may resume a sexual relationship in one month or when given the OK by your doctor.  You may return to work once you are cleared by your doctor.  Do not drive a car for 6 weeks or until released by you surgeon.  Do not drive while taking narcotics.  WEIGHT BEARING Weight bearing as tolerated with assist device (walker, cane, etc) as directed, use it as long as suggested by your surgeon or therapist, typically at least 4-6 weeks.  POSTOPERATIVE CONSTIPATION PROTOCOL Constipation - defined medically as fewer than three stools per week and severe constipation as less than one stool per  week.  One of the most common issues patients have following surgery is constipation.  Even if you have a regular bowel pattern at home, your normal regimen is likely to be disrupted due to multiple reasons following surgery.  Combination of anesthesia, postoperative narcotics, change in appetite and fluid intake all can affect your bowels.  In order to avoid complications following surgery, here are some recommendations in order to help you during your recovery period.  Colace (docusate) - Pick up an over-the-counter form of Colace or another stool softener and take twice a day as long as you are requiring postoperative pain medications.  Take with a full glass of water daily.  If you experience loose stools or diarrhea, hold the colace until you stool forms back up.  If your symptoms do not get better within 1 week or if they get worse, check with your doctor.  Dulcolax (bisacodyl) - Pick up over-the-counter and take as directed by the product packaging as needed to assist with the movement of your bowels.  Take with a full glass of water.  Use this  product as needed if not relieved by Colace only.   MiraLax (polyethylene glycol) - Pick up over-the-counter to have on hand.  MiraLax is a solution that will increase the amount of water in your bowels to assist with bowel movements.  Take as directed and can mix with a glass of water, juice, soda, coffee, or tea.  Take if you go more than two days without a movement. Do not use MiraLax more than once per day. Call your doctor if you are still constipated or irregular after using this medication for 7 days in a row.  If you continue to have problems with postoperative constipation, please contact the office for further assistance and recommendations.  If you experience "the worst abdominal pain ever" or develop nausea or vomiting, please contact the office immediatly for further recommendations for treatment.  ITCHING  If you experience itching with your medications, try taking only a single pain pill, or even half a pain pill at a time.  You can also use Benadryl over the counter for itching or also to help with sleep.   TED HOSE STOCKINGS Wear the elastic stockings on both legs for three weeks following surgery during the day but you may remove then at night for sleeping.  MEDICATIONS See your medication summary on the "After Visit Summary" that the nursing staff will review with you prior to discharge.  You may have some home medications which will be placed on hold until you complete the course of blood thinner medication.  It is important for you to complete the blood thinner medication as prescribed by your surgeon.  Continue your approved medications as instructed at time of discharge.  PRECAUTIONS If you experience chest pain or shortness of breath - call 911 immediately for transfer to the hospital emergency department.  If you develop a fever greater that 101 F, purulent drainage from wound, increased redness or drainage from wound, foul odor from the wound/dressing, or calf pain -  CONTACT YOUR SURGEON.                                                   FOLLOW-UP APPOINTMENTS Make sure you keep all of your appointments after your operation with your surgeon and caregivers. You  should call the office at the above phone number and make an appointment for approximately two weeks after the date of your surgery or on the date instructed by your surgeon outlined in the "After Visit Summary".  RANGE OF MOTION AND STRENGTHENING EXERCISES  These exercises are designed to help you keep full movement of your hip joint. Follow your caregiver's or physical therapist's instructions. Perform all exercises about fifteen times, three times per day or as directed. Exercise both hips, even if you have had only one joint replacement. These exercises can be done on a training (exercise) mat, on the floor, on a table or on a bed. Use whatever works the best and is most comfortable for you. Use music or television while you are exercising so that the exercises are a pleasant break in your day. This will make your life better with the exercises acting as a break in routine you can look forward to.  Lying on your back, slowly slide your foot toward your buttocks, raising your knee up off the floor. Then slowly slide your foot back down until your leg is straight again.  Lying on your back spread your legs as far apart as you can without causing discomfort.  Lying on your side, raise your upper leg and foot straight up from the floor as far as is comfortable. Slowly lower the leg and repeat.  Lying on your back, tighten up the muscle in the front of your thigh (quadriceps muscles). You can do this by keeping your leg straight and trying to raise your heel off the floor. This helps strengthen the largest muscle supporting your knee.  Lying on your back, tighten up the muscles of your buttocks both with the legs straight and with the knee bent at a comfortable angle while keeping your heel on the floor.   IF  YOU ARE TRANSFERRED TO A SKILLED REHAB FACILITY If the patient is transferred to a skilled rehab facility following release from the hospital, a list of the current medications will be sent to the facility for the patient to continue.  When discharged from the skilled rehab facility, please have the facility set up the patient's Bureau prior to being released. Also, the skilled facility will be responsible for providing the patient with their medications at time of release from the facility to include their pain medication, the muscle relaxants, and their blood thinner medication. If the patient is still at the rehab facility at time of the two week follow up appointment, the skilled rehab facility will also need to assist the patient in arranging follow up appointment in our office and any transportation needs.  MAKE SURE YOU:  Understand these instructions.  Get help right away if you are not doing well or get worse.    Pick up stool softner and laxative for home use following surgery while on pain medications. Do not submerge incision under water. Please use good hand washing techniques while changing dressing each day. May shower starting three days after surgery. Please use a clean towel to pat the incision dry following showers. Continue to use ice for pain and swelling after surgery. Do not use any lotions or creams on the incision until instructed by your surgeon.   Do not sit on low chairs, stoools or toilet seats, as it may be difficult to get up from low surfaces   Complete by:  As directed    Driving restrictions   Complete by:  As directed  No driving for two weeks   TED hose   Complete by:  As directed    Use stockings (TED hose) for three weeks on both leg(s).  You may remove them at night for sleeping.   Weight bearing as tolerated   Complete by:  As directed      Allergies as of 03/12/2018      Reactions   Lisinopril Swelling   Angioedema    Atorvastatin Other (See Comments)   Muscle aches       Medication List    TAKE these medications   amLODipine 10 MG tablet Commonly known as:  NORVASC TAKE 1 TABLET BY MOUTH ONCE DAILY   aspirin 325 MG EC tablet Take 1 tablet (325 mg total) by mouth 2 (two) times daily.   carvedilol 3.125 MG tablet Commonly known as:  COREG Take 3.125 mg by mouth daily.   hydrochlorothiazide 25 MG tablet Commonly known as:  HYDRODIURIL Take 1 tablet (25 mg total) daily by mouth. What changed:  when to take this   HYDROcodone-acetaminophen 5-325 MG tablet Commonly known as:  NORCO/VICODIN Take 1-2 tablets by mouth every 6 (six) hours as needed for moderate pain (pain score 4-6).   levothyroxine 75 MCG tablet Commonly known as:  SYNTHROID, LEVOTHROID TAKE 1 TABLET BY MOUTH ONCE DAILY   LUBRICATING EYE DROPS OP Place 1 drop into both eyes daily as needed (redness).   methocarbamol 500 MG tablet Commonly known as:  ROBAXIN Take 1 tablet (500 mg total) by mouth every 6 (six) hours as needed for muscle spasms. Notes to patient:  Muscle Relaxer   traMADol 50 MG tablet Commonly known as:  ULTRAM Take 1-2 tablets (50-100 mg total) by mouth every 6 (six) hours as needed for moderate pain (unrelieved by Norco).            Discharge Care Instructions  (From admission, onward)         Start     Ordered   03/12/18 0000  Weight bearing as tolerated     03/12/18 0746   03/12/18 0000  Change dressing    Comments:  You may change your dressing on Friday, then change the dressing daily with sterile 4 x 4 inch gauze dressing and paper tape.   03/12/18 0746         Follow-up Information    [name], MD. Schedule an appointment as soon as possible for a visit on 03/26/2018.   Specialty:  Orthopedic Surgery Contact information: [address] [place] [address] [phone]        [facility] Follow up.   Specialty:  Home Health Services Why:  Physical  therapy Contact information: [address] [place] [address] [phone]           Signed: Theresa Duty, PA-C Orthopedic Surgery 03/16/2018, 10:04 AM

## 2018-03-30 DIAGNOSIS — G4730 Sleep apnea, unspecified: Secondary | ICD-10-CM | POA: Diagnosis not present

## 2018-03-30 DIAGNOSIS — K56609 Unspecified intestinal obstruction, unspecified as to partial versus complete obstruction: Secondary | ICD-10-CM | POA: Diagnosis not present

## 2018-03-30 DIAGNOSIS — Z7982 Long term (current) use of aspirin: Secondary | ICD-10-CM | POA: Diagnosis not present

## 2018-03-30 DIAGNOSIS — H409 Unspecified glaucoma: Secondary | ICD-10-CM | POA: Diagnosis not present

## 2018-03-30 DIAGNOSIS — M1711 Unilateral primary osteoarthritis, right knee: Secondary | ICD-10-CM | POA: Diagnosis not present

## 2018-03-30 DIAGNOSIS — R7303 Prediabetes: Secondary | ICD-10-CM | POA: Diagnosis not present

## 2018-03-30 DIAGNOSIS — I110 Hypertensive heart disease with heart failure: Secondary | ICD-10-CM | POA: Diagnosis not present

## 2018-03-30 DIAGNOSIS — I5022 Chronic systolic (congestive) heart failure: Secondary | ICD-10-CM | POA: Diagnosis not present

## 2018-03-30 DIAGNOSIS — Z96643 Presence of artificial hip joint, bilateral: Secondary | ICD-10-CM | POA: Diagnosis not present

## 2018-03-30 DIAGNOSIS — Z9181 History of falling: Secondary | ICD-10-CM | POA: Diagnosis not present

## 2018-03-30 DIAGNOSIS — Z87891 Personal history of nicotine dependence: Secondary | ICD-10-CM | POA: Diagnosis not present

## 2018-03-30 DIAGNOSIS — Z471 Aftercare following joint replacement surgery: Secondary | ICD-10-CM | POA: Diagnosis not present

## 2018-04-16 DIAGNOSIS — I110 Hypertensive heart disease with heart failure: Secondary | ICD-10-CM | POA: Diagnosis not present

## 2018-04-16 DIAGNOSIS — K56609 Unspecified intestinal obstruction, unspecified as to partial versus complete obstruction: Secondary | ICD-10-CM | POA: Diagnosis not present

## 2018-04-16 DIAGNOSIS — I5022 Chronic systolic (congestive) heart failure: Secondary | ICD-10-CM | POA: Diagnosis not present

## 2018-04-16 DIAGNOSIS — Z9181 History of falling: Secondary | ICD-10-CM | POA: Diagnosis not present

## 2018-04-16 DIAGNOSIS — Z96643 Presence of artificial hip joint, bilateral: Secondary | ICD-10-CM | POA: Diagnosis not present

## 2018-04-16 DIAGNOSIS — Z87891 Personal history of nicotine dependence: Secondary | ICD-10-CM | POA: Diagnosis not present

## 2018-04-16 DIAGNOSIS — G4730 Sleep apnea, unspecified: Secondary | ICD-10-CM | POA: Diagnosis not present

## 2018-04-16 DIAGNOSIS — Z7982 Long term (current) use of aspirin: Secondary | ICD-10-CM | POA: Diagnosis not present

## 2018-04-16 DIAGNOSIS — Z471 Aftercare following joint replacement surgery: Secondary | ICD-10-CM | POA: Diagnosis not present

## 2018-04-16 DIAGNOSIS — H409 Unspecified glaucoma: Secondary | ICD-10-CM | POA: Diagnosis not present

## 2018-04-16 DIAGNOSIS — R7303 Prediabetes: Secondary | ICD-10-CM | POA: Diagnosis not present

## 2018-04-16 DIAGNOSIS — M1711 Unilateral primary osteoarthritis, right knee: Secondary | ICD-10-CM | POA: Diagnosis not present

## 2018-04-17 DIAGNOSIS — Z96641 Presence of right artificial hip joint: Secondary | ICD-10-CM | POA: Diagnosis not present

## 2018-04-17 DIAGNOSIS — Z471 Aftercare following joint replacement surgery: Secondary | ICD-10-CM | POA: Diagnosis not present

## 2018-04-22 DIAGNOSIS — I110 Hypertensive heart disease with heart failure: Secondary | ICD-10-CM | POA: Diagnosis not present

## 2018-04-22 DIAGNOSIS — R7303 Prediabetes: Secondary | ICD-10-CM | POA: Diagnosis not present

## 2018-04-22 DIAGNOSIS — G4730 Sleep apnea, unspecified: Secondary | ICD-10-CM | POA: Diagnosis not present

## 2018-04-22 DIAGNOSIS — Z471 Aftercare following joint replacement surgery: Secondary | ICD-10-CM | POA: Diagnosis not present

## 2018-04-22 DIAGNOSIS — H409 Unspecified glaucoma: Secondary | ICD-10-CM | POA: Diagnosis not present

## 2018-04-22 DIAGNOSIS — Z9181 History of falling: Secondary | ICD-10-CM | POA: Diagnosis not present

## 2018-04-22 DIAGNOSIS — M1711 Unilateral primary osteoarthritis, right knee: Secondary | ICD-10-CM | POA: Diagnosis not present

## 2018-04-22 DIAGNOSIS — K56609 Unspecified intestinal obstruction, unspecified as to partial versus complete obstruction: Secondary | ICD-10-CM | POA: Diagnosis not present

## 2018-04-22 DIAGNOSIS — Z96643 Presence of artificial hip joint, bilateral: Secondary | ICD-10-CM | POA: Diagnosis not present

## 2018-04-22 DIAGNOSIS — Z7982 Long term (current) use of aspirin: Secondary | ICD-10-CM | POA: Diagnosis not present

## 2018-04-22 DIAGNOSIS — Z87891 Personal history of nicotine dependence: Secondary | ICD-10-CM | POA: Diagnosis not present

## 2018-04-22 DIAGNOSIS — I5022 Chronic systolic (congestive) heart failure: Secondary | ICD-10-CM | POA: Diagnosis not present

## 2018-05-04 ENCOUNTER — Other Ambulatory Visit: Payer: Self-pay | Admitting: Physician Assistant

## 2018-05-07 ENCOUNTER — Ambulatory Visit (INDEPENDENT_AMBULATORY_CARE_PROVIDER_SITE_OTHER): Payer: PPO | Admitting: Family Medicine

## 2018-05-07 ENCOUNTER — Encounter: Payer: Self-pay | Admitting: Family Medicine

## 2018-05-07 VITALS — BP 138/92 | HR 64 | Temp 98.3°F | Wt 186.0 lb

## 2018-05-07 DIAGNOSIS — R7303 Prediabetes: Secondary | ICD-10-CM | POA: Diagnosis not present

## 2018-05-07 DIAGNOSIS — E039 Hypothyroidism, unspecified: Secondary | ICD-10-CM

## 2018-05-07 DIAGNOSIS — I10 Essential (primary) hypertension: Secondary | ICD-10-CM | POA: Diagnosis not present

## 2018-05-07 MED ORDER — CARVEDILOL 3.125 MG PO TABS: 3.1250 mg | ORAL_TABLET | Freq: Two times a day (BID) | ORAL | 5 refills | Status: DC

## 2018-05-07 NOTE — Progress Notes (Signed)
Subjective:    Patient ID: Adrienne [name], female    DOB: [birth year], 72 y.o.   MRN: [id number]  HPI Patient is here today for hospital discharge follow-up.  Recently underwent hip replacement.  She is doing well postoperatively.  She is ambulating today without difficulty.  Her blood pressure is borderline at 138/92.  She denies any chest pain shortness of breath or dyspnea on exertion.  Earlier this summer she was diagnosed with prediabetes.  Hemoglobin A1c was 6.4.  She denies any polyuria, polydipsia, or blurry vision.  She is due today to recheck her sugar as she has not been monitoring her sugars at home.  She denies any myalgias or right upper quadrant pain.  Her last TSH was checked in July prior to her 2 surgeries.  She is due to repeat this today. Past Medical History:  Diagnosis Date  . Ambulates with cane    straight cane  . Arthritis    knee, back  . CHF (congestive heart failure) (HCC)    EF 55-60%  pt. denies. Dr. did further test and could not confirm per pt.  Marland Kitchen Dysrhythmia    hx of - no current problems  . Headache    hx of migraines - no longer a problem  . History of blood transfusion 1986   w/ Hysterectomy surgery  . Hyperlipidemia    diet controlled - no meds  . Hypertension   . Hypothyroidism   . Knee pain   . Pelvic kidney    lower right pelvic kidney   . Pre-diabetes   . Prediabetes    diet controlled - no meds  . SBO (small bowel obstruction) ([place]) 10/2016   surgery   . SBO (small bowel obstruction) ([place]) 10/2016  . Shoulder pain   . Sleep apnea    Mild - no mask needed per sleep study  . Smoker    quit smoking 2018  . Thyroid disease   . Tinnitus    BOTH EARS   Past Surgical History:  Procedure Laterality Date  . ABDOMINAL HYSTERECTOMY  1986   COMPLETE  . BACK SURGERY     lower back  . CHOLECYSTECTOMY    . COLONOSCOPY  02/15/2008   [name]   . ECTOPIC PREGNANCY SURGERY    . JOINT REPLACEMENT     Left hip total Dr. [name] 10-01-17  .  polypectomy-oropharynx    . right knee meniscus    . SBO  10/2016   small bowel obstruction  . TOTAL HIP ARTHROPLASTY Left 10/01/2017   Procedure: LEFT  TOTAL HIP ARTHROPLASTY ANTERIOR APPROACH;  Surgeon: [name], MD;  Location: [facility];  Service: Orthopedics;  Laterality: Left;  . TOTAL HIP ARTHROPLASTY Right 03/11/2018   Procedure: RIGHT TOTAL HIP ARTHROPLASTY ANTERIOR APPROACH;  Surgeon: [name], MD;  Location: [facility];  Service: Orthopedics;  Laterality: Right;  167min  . UPPER GASTROINTESTINAL ENDOSCOPY     Current Outpatient Medications on File Prior to Visit  Medication Sig Dispense Refill  . amLODipine (NORVASC) 10 MG tablet TAKE 1 TABLET BY MOUTH ONCE DAILY 90 tablet 2  . hydrochlorothiazide (HYDRODIURIL) 25 MG tablet Take 1 tablet (25 mg total) daily by mouth. (Patient taking differently: Take 25 mg by mouth daily at 2 PM. ) 90 tablet 3  . levothyroxine (SYNTHROID, LEVOTHROID) 75 MCG tablet TAKE 1 TABLET BY MOUTH ONCE DAILY 90 tablet 1   No current facility-administered medications on file prior to visit.    Allergies  Allergen Reactions  . Lisinopril Swelling    Angioedema  . Atorvastatin Other (See Comments)    Muscle aches     Social History   Socioeconomic History  . Marital status: Widowed    Spouse name: Not on file  . Number of children: Not on file  . Years of education: Not on file  . Highest education level: Not on file  Occupational History  . Occupation: retired  Scientific laboratory technician  . Financial resource strain: Not on file  . Food insecurity:    Worry: Not on file    Inability: Not on file  . Transportation needs:    Medical: Not on file    Non-medical: Not on file  Tobacco Use  . Smoking status: Former Smoker    Packs/day: 1.00    Years: 32.00    Pack years: 32.00    Types: E-cigarettes, Cigarettes    Last attempt to quit: 11/20/2016    Years since quitting: 1.4  . Smokeless tobacco: Never Used  . Tobacco comment: Smoker since 1975 has  quit and started back several times!!,  Substance and Sexual Activity  . Alcohol use: No  . Drug use: No  . Sexual activity: Not Currently    Birth control/protection: Surgical    Comment: Hysterectomy  Lifestyle  . Physical activity:    Days per week: Not on file    Minutes per session: Not on file  . Stress: Not on file  Relationships  . Social connections:    Talks on phone: Not on file    Gets together: Not on file    Attends religious service: Not on file    Active member of club or organization: Not on file    Attends meetings of clubs or organizations: Not on file    Relationship status: Not on file  . Intimate partner violence:    Fear of current or ex partner: Not on file    Emotionally abused: Not on file    Physically abused: Not on file    Forced sexual activity: Not on file  Other Topics Concern  . Not on file  Social History Narrative  . Not on file      Review of Systems  All other systems reviewed and are negative.      Objective:   Physical Exam  Constitutional: She appears well-developed and well-nourished. No distress.  Neck: Normal range of motion. No thyromegaly present.  Cardiovascular: Normal rate, regular rhythm, normal heart sounds and intact distal pulses. Exam reveals no friction rub.  No murmur heard. Pulmonary/Chest: Effort normal and breath sounds normal. No stridor. No respiratory distress. She has no wheezes. She has no rales.  Abdominal: Soft. Bowel sounds are normal. She exhibits no distension and no mass. There is no tenderness. There is no guarding.  Musculoskeletal: She exhibits no edema.  Skin: She is not diaphoretic.  Vitals reviewed.         Assessment & Plan:  Prediabetes - Plan: Hemoglobin A1c, COMPLETE METABOLIC PANEL WITH GFR, Lipid panel  Hypothyroidism, unspecified type - Plan: TSH  Essential hypertension  Blood pressures well controlled but I did recommend that she take Coreg 3.125 mg p.o. twice daily instead  of daily.  I will check hemoglobin A1c to monitor her prediabetes.  I recommended 10 pounds weight loss and a low carbohydrate diet restricting carbohydrates to less than 45 g of carbs per meal.  I will check a fasting lipid panel.  Goal LDL cholesterol is  less than 100.  Is a diabetic, she would benefit from a statin.  She previously had a reaction to atorvastatin.  Therefore we will see how bad her LDL cholesterol is and try to convince the patient to try different statin.  I would also check a TSH to ensure adequate treatment of her levothyroxine.   I did recommend a flu shot.

## 2018-05-08 LAB — HEMOGLOBIN A1C
EAG (MMOL/L): 7.3 (calc)
Hgb A1c MFr Bld: 6.2 % of total Hgb — ABNORMAL HIGH (ref <5.7)
Mean Plasma Glucose: 131 (calc)

## 2018-05-08 LAB — COMPLETE METABOLIC PANEL WITH GFR
AG Ratio: 1 (calc) (ref 1.0–2.5)
ALBUMIN MSPROF: 3.8 g/dL (ref 3.6–5.1)
ALKALINE PHOSPHATASE (APISO): 96 U/L (ref 33–130)
ALT: 19 U/L (ref 6–29)
AST: 24 U/L (ref 10–35)
BUN: 17 mg/dL (ref 7–25)
CO2: 29 mmol/L (ref 20–32)
CREATININE: 0.83 mg/dL (ref 0.60–0.93)
Calcium: 9.6 mg/dL (ref 8.6–10.4)
Chloride: 103 mmol/L (ref 98–110)
GFR, EST AFRICAN AMERICAN: 83 mL/min/{1.73_m2} (ref >=60)
GFR, Est Non African American: 71 mL/min/{1.73_m2} (ref >=60)
Globulin: 3.7 g/dL (calc) (ref 1.9–3.7)
Glucose, Bld: 101 mg/dL — ABNORMAL HIGH (ref 65–99)
Potassium: 3.8 mmol/L (ref 3.5–5.3)
Sodium: 142 mmol/L (ref 135–146)
TOTAL PROTEIN: 7.5 g/dL (ref 6.1–8.1)
Total Bilirubin: 0.4 mg/dL (ref 0.2–1.2)

## 2018-05-08 LAB — LIPID PANEL
CHOL/HDL RATIO: 4.6 (calc) (ref <5.0)
CHOLESTEROL: 271 mg/dL — AB (ref <200)
HDL: 59 mg/dL (ref >50)
LDL CHOLESTEROL (CALC): 185 mg/dL — AB
Non-HDL Cholesterol (Calc): 212 mg/dL (calc) — ABNORMAL HIGH (ref <130)
Triglycerides: 134 mg/dL (ref <150)

## 2018-05-08 LAB — TSH: TSH: 1.16 m[IU]/L (ref 0.40–4.50)

## 2018-06-12 ENCOUNTER — Other Ambulatory Visit: Payer: Self-pay | Admitting: Family Medicine

## 2018-08-25 ENCOUNTER — Telehealth: Payer: Self-pay | Admitting: Family Medicine

## 2018-08-25 MED ORDER — LEVOTHYROXINE SODIUM 75 MCG PO TABS: 75.0000 ug | ORAL_TABLET | Freq: Every day | ORAL | 1 refills | Status: DC

## 2018-08-25 NOTE — Telephone Encounter (Signed)
Refill on levothyroxine to [facility] in [place], she is out.

## 2018-08-25 NOTE — Telephone Encounter (Signed)
Medication called/sent to requested pharmacy  

## 2018-09-01 ENCOUNTER — Other Ambulatory Visit: Payer: Self-pay | Admitting: *Deleted

## 2018-09-01 MED ORDER — LEVOTHYROXINE SODIUM 75 MCG PO TABS: 75.0000 ug | ORAL_TABLET | Freq: Every day | ORAL | 1 refills | Status: DC

## 2018-09-18 ENCOUNTER — Telehealth: Payer: Self-pay | Admitting: Family Medicine

## 2018-09-18 NOTE — Telephone Encounter (Signed)
I called patient to verify that she was aware that the attorney [facility] Legal Assistant for Geroge Baseman, Esq. for her Workers compensation  case was requesting her records for 5 years.She stated that she felt that was a normal request but I asked her to cal her attorney to verify that they were aware of this request.   She called her attorney's office and verified with the secretary that her attorney was aware of the request. She said it was fine to send requested information. They have a tentative date of court for 10/14/2018 per patient.

## 2018-12-09 ENCOUNTER — Other Ambulatory Visit: Payer: Self-pay | Admitting: Family Medicine

## 2018-12-09 DIAGNOSIS — Z1231 Encounter for screening mammogram for malignant neoplasm of breast: Secondary | ICD-10-CM

## 2018-12-14 ENCOUNTER — Other Ambulatory Visit: Payer: Self-pay

## 2018-12-14 ENCOUNTER — Ambulatory Visit (INDEPENDENT_AMBULATORY_CARE_PROVIDER_SITE_OTHER): Payer: PPO | Admitting: Family Medicine

## 2018-12-14 VITALS — BP 110/62 | HR 62 | Temp 98.4°F | Resp 18 | Ht 64.0 in | Wt 195.5 lb

## 2018-12-14 DIAGNOSIS — R7303 Prediabetes: Secondary | ICD-10-CM | POA: Diagnosis not present

## 2018-12-14 DIAGNOSIS — E785 Hyperlipidemia, unspecified: Secondary | ICD-10-CM | POA: Diagnosis not present

## 2018-12-14 DIAGNOSIS — E039 Hypothyroidism, unspecified: Secondary | ICD-10-CM | POA: Diagnosis not present

## 2018-12-14 DIAGNOSIS — I5022 Chronic systolic (congestive) heart failure: Secondary | ICD-10-CM

## 2018-12-14 DIAGNOSIS — I10 Essential (primary) hypertension: Secondary | ICD-10-CM

## 2018-12-14 NOTE — Progress Notes (Signed)
Subjective:    Patient ID: Adrienne [name], female    DOB: [birth year], 73 y.o.   MRN: [id number]  HPI When I last saw the patient in November, her hemoglobin A1c was 6.2.  Since I last saw the patient, she is gained 9 pounds.  She denies any polyuria, polydipsia, or blurry vision.  She denies any neuropathy in her feet.  She is not checking her blood sugar.  She denies any chest pain shortness of breath or dyspnea on exertion.  She has been relatively sedentary during the quarantine.  She is caring for her grandchildren but other than that she is getting no regular exercise.  Her blood pressure today is adequately controlled.  She denies any palpitations or tachycardia.  She denies any orthopnea or paroxysmal nocturnal dyspnea. Past Medical History:  Diagnosis Date  . Ambulates with cane    straight cane  . Arthritis    knee, back  . CHF (congestive heart failure) (HCC)    EF 55-60%  pt. denies. Dr. did further test and could not confirm per pt.  Marland Kitchen Dysrhythmia    hx of - no current problems  . Headache    hx of migraines - no longer a problem  . History of blood transfusion 1986   w/ Hysterectomy surgery  . Hyperlipidemia    diet controlled - no meds  . Hypertension   . Hypothyroidism   . Knee pain   . Pelvic kidney    lower right pelvic kidney   . Pre-diabetes   . Prediabetes    diet controlled - no meds  . SBO (small bowel obstruction) ([place]) 10/2016   surgery   . SBO (small bowel obstruction) ([place]) 10/2016  . Shoulder pain   . Sleep apnea    Mild - no mask needed per sleep study  . Smoker    quit smoking 2018  . Thyroid disease   . Tinnitus    BOTH EARS   Past Surgical History:  Procedure Laterality Date  . ABDOMINAL HYSTERECTOMY  1986   COMPLETE  . BACK SURGERY     lower back  . CHOLECYSTECTOMY    . COLONOSCOPY  02/15/2008   [name]   . ECTOPIC PREGNANCY SURGERY    . JOINT REPLACEMENT     Left hip total Dr. [name] 10-01-17  . polypectomy-oropharynx    .  right knee meniscus    . SBO  10/2016   small bowel obstruction  . TOTAL HIP ARTHROPLASTY Left 10/01/2017   Procedure: LEFT  TOTAL HIP ARTHROPLASTY ANTERIOR APPROACH;  Surgeon: [name], MD;  Location: [facility];  Service: Orthopedics;  Laterality: Left;  . TOTAL HIP ARTHROPLASTY Right 03/11/2018   Procedure: RIGHT TOTAL HIP ARTHROPLASTY ANTERIOR APPROACH;  Surgeon: [name], MD;  Location: [facility];  Service: Orthopedics;  Laterality: Right;  115min  . UPPER GASTROINTESTINAL ENDOSCOPY     Current Outpatient Medications on File Prior to Visit  Medication Sig Dispense Refill  . amLODipine (NORVASC) 10 MG tablet TAKE 1 TABLET BY MOUTH ONCE DAILY 90 tablet 2  . carvedilol (COREG) 3.125 MG tablet Take 1 tablet (3.125 mg total) by mouth 2 (two) times daily with a meal. 60 tablet 5  . hydrochlorothiazide (HYDRODIURIL) 25 MG tablet TAKE 1 TABLET BY MOUTH ONCE DAILY 90 tablet 2  . levothyroxine (SYNTHROID, LEVOTHROID) 75 MCG tablet Take 1 tablet (75 mcg total) by mouth daily. 90 tablet 1   No current facility-administered medications on file prior to visit.  Allergies  Allergen Reactions  . Lisinopril Swelling    Angioedema  . Atorvastatin Other (See Comments)    Muscle aches     Social History   Socioeconomic History  . Marital status: Widowed    Spouse name: Not on file  . Number of children: Not on file  . Years of education: Not on file  . Highest education level: Not on file  Occupational History  . Occupation: retired  Scientific laboratory technician  . Financial resource strain: Not on file  . Food insecurity    Worry: Not on file    Inability: Not on file  . Transportation needs    Medical: Not on file    Non-medical: Not on file  Tobacco Use  . Smoking status: Former Smoker    Packs/day: 1.00    Years: 32.00    Pack years: 32.00    Types: E-cigarettes, Cigarettes    Quit date: 11/20/2016    Years since quitting: 2.0  . Smokeless tobacco: Never Used  . Tobacco comment: Smoker  since 1975 has quit and started back several times!!,  Substance and Sexual Activity  . Alcohol use: No  . Drug use: No  . Sexual activity: Not Currently    Birth control/protection: Surgical    Comment: Hysterectomy  Lifestyle  . Physical activity    Days per week: Not on file    Minutes per session: Not on file  . Stress: Not on file  Relationships  . Social Herbalist on phone: Not on file    Gets together: Not on file    Attends religious service: Not on file    Active member of club or organization: Not on file    Attends meetings of clubs or organizations: Not on file    Relationship status: Not on file  . Intimate partner violence    Fear of current or ex partner: Not on file    Emotionally abused: Not on file    Physically abused: Not on file    Forced sexual activity: Not on file  Other Topics Concern  . Not on file  Social History Narrative  . Not on file      Review of Systems  All other systems reviewed and are negative.      Objective:   Physical Exam Vitals signs reviewed.  Constitutional:      General: She is not in acute distress.    Appearance: She is well-developed. She is not diaphoretic.  Neck:     Musculoskeletal: Normal range of motion.     Thyroid: No thyromegaly.  Cardiovascular:     Rate and Rhythm: Normal rate and regular rhythm.     Heart sounds: Normal heart sounds. No murmur. No friction rub.  Pulmonary:     Effort: Pulmonary effort is normal. No respiratory distress.     Breath sounds: Normal breath sounds. No stridor. No wheezing or rales.  Abdominal:     General: Bowel sounds are normal. There is no distension.     Palpations: Abdomen is soft. There is no mass.     Tenderness: There is no abdominal tenderness. There is no guarding.           Assessment & Plan:  The primary encounter diagnosis was Prediabetes. Diagnoses of Dyslipidemia, Chronic systolic CHF (congestive heart failure) ([place]), Acquired  hypothyroidism, and Essential hypertension were also pertinent to this visit. Given her history of prediabetes I will check hemoglobin A1c.  I encouraged  30 minutes of exercise 5 days a week.  I recommended that she gradually increase aerobic exercise slowly up to this goal.  I recommended a stationary bike given her orthopedic issues.  Have also recommended weight loss and dietary changes.  Blood pressure today is adequately controlled.  I will check a CBC, CMP, fasting lipid panel in addition to the hemoglobin A1c.  Her goal HDL cholesterol will be less than 100.  I will also check a TSH to monitor her hypothyroidism.

## 2018-12-15 ENCOUNTER — Encounter: Payer: Self-pay | Admitting: Family Medicine

## 2018-12-15 LAB — CBC WITH DIFFERENTIAL/PLATELET
Absolute Monocytes: 522 cells/uL (ref 200–950)
Basophils Absolute: 36 cells/uL (ref 0–200)
Basophils Relative: 0.4 %
Eosinophils Absolute: 153 cells/uL (ref 15–500)
Eosinophils Relative: 1.7 %
HCT: 38.3 % (ref 35.0–45.0)
Hemoglobin: 12.7 g/dL (ref 11.7–15.5)
Lymphs Abs: 3438 cells/uL (ref 850–3900)
MCH: 29.5 pg (ref 27.0–33.0)
MCHC: 33.2 g/dL (ref 32.0–36.0)
MCV: 89.1 fL (ref 80.0–100.0)
MPV: 12.1 fL (ref 7.5–12.5)
Monocytes Relative: 5.8 %
Neutro Abs: 4851 cells/uL (ref 1500–7800)
Neutrophils Relative %: 53.9 %
Platelets: 291 10*3/uL (ref 140–400)
RBC: 4.3 10*6/uL (ref 3.80–5.10)
RDW: 13.9 % (ref 11.0–15.0)
Total Lymphocyte: 38.2 %
WBC: 9 10*3/uL (ref 3.8–10.8)

## 2018-12-15 LAB — LIPID PANEL
Cholesterol: 266 mg/dL — ABNORMAL HIGH (ref <200)
HDL: 57 mg/dL (ref >=50)
LDL Cholesterol (Calc): 177 mg/dL (calc) — ABNORMAL HIGH
Non-HDL Cholesterol (Calc): 209 mg/dL (calc) — ABNORMAL HIGH (ref <130)
Total CHOL/HDL Ratio: 4.7 (calc) (ref <5.0)
Triglycerides: 167 mg/dL — ABNORMAL HIGH (ref <150)

## 2018-12-15 LAB — COMPLETE METABOLIC PANEL WITH GFR
AG Ratio: 1.1 (calc) (ref 1.0–2.5)
ALT: 12 U/L (ref 6–29)
AST: 22 U/L (ref 10–35)
Albumin: 3.8 g/dL (ref 3.6–5.1)
Alkaline phosphatase (APISO): 86 U/L (ref 37–153)
BUN/Creatinine Ratio: 18 (calc) (ref 6–22)
BUN: 21 mg/dL (ref 7–25)
CO2: 25 mmol/L (ref 20–32)
Calcium: 9.4 mg/dL (ref 8.6–10.4)
Chloride: 102 mmol/L (ref 98–110)
Creat: 1.15 mg/dL — ABNORMAL HIGH (ref 0.60–0.93)
GFR, Est African American: 55 mL/min/{1.73_m2} — ABNORMAL LOW (ref >=60)
GFR, Est Non African American: 48 mL/min/{1.73_m2} — ABNORMAL LOW (ref >=60)
Globulin: 3.4 g/dL (calc) (ref 1.9–3.7)
Glucose, Bld: 123 mg/dL — ABNORMAL HIGH (ref 65–99)
Potassium: 3.6 mmol/L (ref 3.5–5.3)
Sodium: 139 mmol/L (ref 135–146)
Total Bilirubin: 0.4 mg/dL (ref 0.2–1.2)
Total Protein: 7.2 g/dL (ref 6.1–8.1)

## 2018-12-15 LAB — TSH: TSH: 4.08 mIU/L (ref 0.40–4.50)

## 2018-12-15 LAB — HEMOGLOBIN A1C
Hgb A1c MFr Bld: 6.4 % of total Hgb — ABNORMAL HIGH (ref <5.7)
Mean Plasma Glucose: 137 (calc)
eAG (mmol/L): 7.6 (calc)

## 2018-12-22 DIAGNOSIS — H2513 Age-related nuclear cataract, bilateral: Secondary | ICD-10-CM | POA: Diagnosis not present

## 2018-12-22 DIAGNOSIS — H25013 Cortical age-related cataract, bilateral: Secondary | ICD-10-CM | POA: Diagnosis not present

## 2018-12-22 DIAGNOSIS — H40023 Open angle with borderline findings, high risk, bilateral: Secondary | ICD-10-CM | POA: Diagnosis not present

## 2018-12-22 DIAGNOSIS — H04123 Dry eye syndrome of bilateral lacrimal glands: Secondary | ICD-10-CM | POA: Diagnosis not present

## 2018-12-22 LAB — HM DIABETES EYE EXAM

## 2018-12-23 ENCOUNTER — Other Ambulatory Visit: Payer: Self-pay | Admitting: Family Medicine

## 2018-12-23 DIAGNOSIS — D0510 Intraductal carcinoma in situ of unspecified breast: Secondary | ICD-10-CM

## 2018-12-23 HISTORY — DX: Intraductal carcinoma in situ of unspecified breast: D05.10

## 2018-12-23 MED ORDER — AMLODIPINE BESYLATE 10 MG PO TABS: 10.0000 mg | ORAL_TABLET | Freq: Every day | ORAL | 2 refills | Status: DC

## 2018-12-30 ENCOUNTER — Encounter: Payer: Self-pay | Admitting: *Deleted

## 2019-01-22 ENCOUNTER — Other Ambulatory Visit: Payer: Self-pay

## 2019-01-22 ENCOUNTER — Ambulatory Visit
Admission: RE | Admit: 2019-01-22 | Discharge: 2019-01-22 | Disposition: A | Discharge status: Home / Self Care | Payer: PPO | Source: Ambulatory Visit | Attending: Family Medicine | Admitting: Family Medicine

## 2019-01-22 DIAGNOSIS — Z1231 Encounter for screening mammogram for malignant neoplasm of breast: Secondary | ICD-10-CM | POA: Diagnosis not present

## 2019-01-25 ENCOUNTER — Other Ambulatory Visit: Payer: Self-pay | Admitting: Family Medicine

## 2019-01-25 DIAGNOSIS — R921 Mammographic calcification found on diagnostic imaging of breast: Secondary | ICD-10-CM

## 2019-01-28 ENCOUNTER — Ambulatory Visit
Admission: RE | Admit: 2019-01-28 | Discharge: 2019-01-28 | Disposition: A | Discharge status: Home / Self Care | Payer: PPO | Source: Ambulatory Visit | Attending: Family Medicine | Admitting: Family Medicine

## 2019-01-28 ENCOUNTER — Other Ambulatory Visit: Payer: Self-pay

## 2019-01-28 ENCOUNTER — Other Ambulatory Visit: Payer: Self-pay | Admitting: Family Medicine

## 2019-01-28 DIAGNOSIS — R921 Mammographic calcification found on diagnostic imaging of breast: Secondary | ICD-10-CM

## 2019-02-02 ENCOUNTER — Other Ambulatory Visit: Payer: Self-pay

## 2019-02-02 ENCOUNTER — Other Ambulatory Visit: Payer: Self-pay | Admitting: Family Medicine

## 2019-02-02 ENCOUNTER — Ambulatory Visit
Admission: RE | Admit: 2019-02-02 | Discharge: 2019-02-02 | Disposition: A | Discharge status: Home / Self Care | Payer: PPO | Source: Ambulatory Visit | Attending: Family Medicine | Admitting: Family Medicine

## 2019-02-02 DIAGNOSIS — R921 Mammographic calcification found on diagnostic imaging of breast: Secondary | ICD-10-CM

## 2019-02-02 DIAGNOSIS — D0511 Intraductal carcinoma in situ of right breast: Secondary | ICD-10-CM | POA: Diagnosis not present

## 2019-02-03 ENCOUNTER — Telehealth: Payer: Self-pay | Admitting: Hematology

## 2019-02-03 NOTE — Telephone Encounter (Signed)
Spoke to patient to confirm afternoon [facility] appointment for 8/19, packet will be mailed to patient

## 2019-02-04 ENCOUNTER — Encounter: Payer: Self-pay | Admitting: *Deleted

## 2019-02-04 ENCOUNTER — Other Ambulatory Visit: Payer: Self-pay | Admitting: *Deleted

## 2019-02-04 DIAGNOSIS — D0511 Intraductal carcinoma in situ of right breast: Secondary | ICD-10-CM

## 2019-02-08 ENCOUNTER — Encounter: Payer: Self-pay | Admitting: Family Medicine

## 2019-02-09 NOTE — Progress Notes (Signed)
[place]   [phone] Fax:([phone]) [place] Note   Patient Care Team: Susy Frizzle, MD as PCP - General (Family Medicine) Nahser, Wonda Cheng, MD as PCP - Cardiology (Cardiology) Rockwell Germany, RN as Oncology Nurse Navigator Mauro Kaufmann, RN as Oncology Nurse Navigator Jovita Kussmaul, MD as Consulting Physician (General Surgery) Truitt Merle, MD as Consulting Physician (Hematology) Eppie Gibson, MD as Attending Physician (Radiation Oncology)  Date of Service:  02/10/2019   CHIEF COMPLAINTS/PURPOSE OF CONSULTATION:  Newly diagnosed Ductal carcinoma in situ (DCIS) of right breast   Oncology History  Ductal carcinoma in situ (DCIS) of right breast  01/28/2019 Mammogram   Diagnostic Mammogram 01/28/19  IMPRESSION: Right breast retroareolar 7 mm grouped pleomorphic calcifications are suspicious.   02/02/2019 Initial Biopsy   Diagnosis 02/02/19 Breast, right, needle core biopsy, outer retroareolar - DUCTAL CARCINOMA IN SITU WITH CALCIFICATIONS. Microscopic Comment The ductal carcinoma in situ is intermediate grade. Estrogen and progesterone receptors will be performed.  Results: IMMUNOHISTOCHEMICAL AND MORPHOMETRIC ANALYSIS PERFORMED MANUALLY Estrogen Receptor: 90%, POSITIVE, STRONG STAINING INTENSITY Progesterone Receptor: 20%, POSITIVE, STRONG STAINING INTENSITY    02/02/2019 Cancer Staging   Staging form: Breast, AJCC 8th Edition - Clinical stage from 02/02/2019: Stage 0 (cTis (DCIS), cN0, cM0, G2, ER+, PR+, HER2: Not Assessed) - Signed by Truitt Merle, MD on 02/10/2019   02/04/2019 Initial Diagnosis   Ductal carcinoma in situ (DCIS) of right breast      HISTORY OF PRESENTING ILLNESS:  Adrienne [name] 73 y.o. female is a here because of right breast DCIS. The patient presents to the Breast clinic today alone.  Her right mass was found by screening mammogram. She did note feel the mass herself. She notes occasional pain  in her right breast .She denies pain anywhere else. She denies any other breast changes.   Today the patient notes she is overall doing well. She has gained weight in the last year. She tries to be active while taking her care her grandchildren. She is able to take care of them and herself well.   Socially she lives with her grandchildren part time. She has 3 adult children. She is retired. She quit smoking in 2017  They have a PMHx of thyroid dysfunction, on levothyroxine. She is on Coreg for her heart condition, CHF. On amlodipine for her HTN. She had hysterectomy before and had a hemorrhage. She required a blood transfusion. She had hysterectomy in 1986 due to precancerous lesions. She had knee, back, hip and gallbladder surgery. She had a tubal pregnancy and had surgery for this.    GYN HISTORY  Menarchal: 12/13 LMP: in 1986 Contraceptive: NA HRT: No  G4P3A1: tubal pregnancy. Her first child born when 23 yo.     REVIEW OF SYSTEMS:  Constitutional: Denies fevers, chills or abnormal night sweats Eyes: Denies blurriness of vision, double vision or watery eyes Ears, nose, mouth, throat, and face: Denies mucositis or sore throat Respiratory: Denies cough, dyspnea or wheezes Cardiovascular: Denies palpitation, chest discomfort or lower extremity swelling Gastrointestinal:  Denies nausea, heartburn or change in bowel habits Skin: Denies abnormal skin rashes Lymphatics: Denies new lymphadenopathy or easy bruising Neurological:Denies numbness, tingling or new weaknesses Behavioral/Psych: Mood is stable, no new changes  All other systems were reviewed with the patient and are negative.    MEDICAL HISTORY:  Past Medical History:  Diagnosis Date   Ambulates with cane    straight cane   Arthritis  knee, back   CHF (congestive heart failure) ([place])    EF 55-60%  pt. denies. Dr. did further test and could not confirm per pt.   Ductal carcinoma in situ of breast    Dysrhythmia     hx of - no current problems   Headache    hx of migraines - no longer a problem   History of blood transfusion 1986   w/ Hysterectomy surgery   Hyperlipidemia    diet controlled - no meds   Hypertension    Hypothyroidism    Knee pain    Pelvic kidney    lower right pelvic kidney    Pre-diabetes    Prediabetes    diet controlled - no meds   SBO (small bowel obstruction) ([place]) 10/2016   surgery    SBO (small bowel obstruction) ([place]) 10/2016   Shoulder pain    Sleep apnea    Mild - no mask needed per sleep study   Smoker    quit smoking 2018   Thyroid disease    Tinnitus    BOTH EARS    SURGICAL HISTORY: Past Surgical History:  Procedure Laterality Date   ABDOMINAL HYSTERECTOMY  1986   COMPLETE   BACK SURGERY     lower back   CHOLECYSTECTOMY     COLONOSCOPY  02/15/2008   [facility] PREGNANCY SURGERY     JOINT REPLACEMENT     Left hip total Dr. [name] 10-01-17   polypectomy-oropharynx     right knee meniscus     SBO  10/2016   small bowel obstruction   TOTAL HIP ARTHROPLASTY Left 10/01/2017   Procedure: LEFT  TOTAL HIP ARTHROPLASTY ANTERIOR APPROACH;  Surgeon: [name], MD;  Location: [facility];  Service: Orthopedics;  Laterality: Left;   TOTAL HIP ARTHROPLASTY Right 03/11/2018   Procedure: RIGHT TOTAL HIP ARTHROPLASTY ANTERIOR APPROACH;  Surgeon: [name], MD;  Location: [facility];  Service: Orthopedics;  Laterality: Right;  113mn   UPPER GASTROINTESTINAL ENDOSCOPY      SOCIAL HISTORY: Social History   Socioeconomic History   Marital status: Widowed    Spouse name: Not on file   Number of children: Not on file   Years of education: Not on file   Highest education level: Not on file  Occupational History   Occupation: retired  SScientist, 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   Food insecurity    Worry: Not on file    Inability: Not on fLexicographerneeds    Medical: Not on file     Non-medical: Not on file  Tobacco Use   Smoking status: Former Smoker    Packs/day: 1.00    Years: 32.00    Pack years: 32.00    Types: E-cigarettes, Cigarettes    Quit date: 11/20/2016    Years since quitting: 2.2   Smokeless tobacco: Never Used   Tobacco comment: Smoker since 1975 has quit and started back several times!!,  Substance and Sexual Activity   Alcohol use: No   Drug use: No   Sexual activity: Not Currently    Birth control/protection: Surgical    Comment: Hysterectomy  Lifestyle   Physical activity    Days per week: Not on file    Minutes per session: Not on file   Stress: Not on file  Relationships   Social connections    Talks on phone: Not on file    Gets together: Not on file  Attends religious service: Not on file    Active member of club or organization: Not on file    Attends meetings of clubs or organizations: Not on file    Relationship status: Not on file   Intimate partner violence    Fear of current or ex partner: Not on file    Emotionally abused: Not on file    Physically abused: Not on file    Forced sexual activity: Not on file  Other Topics Concern   Not on file  Social History Narrative   Not on file    FAMILY HISTORY: Family History  Problem Relation Age of Onset   Cancer Sister    Diabetes Brother    Diabetes Brother    Hypertension Other    Breast cancer Sister    Colon cancer Neg Hx    Colon polyps Neg Hx    Rectal cancer Neg Hx    Stomach cancer Neg Hx     ALLERGIES:  is allergic to lisinopril and atorvastatin.  MEDICATIONS:  Current Outpatient Medications  Medication Sig Dispense Refill   amLODipine (NORVASC) 10 MG tablet Take 1 tablet (10 mg total) by mouth daily. 90 tablet 2   carvedilol (COREG) 3.125 MG tablet Take 1 tablet (3.125 mg total) by mouth 2 (two) times daily with a meal. 60 tablet 5   hydrochlorothiazide (HYDRODIURIL) 25 MG tablet TAKE 1 TABLET BY MOUTH ONCE DAILY 90 tablet 2    levothyroxine (SYNTHROID, LEVOTHROID) 75 MCG tablet Take 1 tablet (75 mcg total) by mouth daily. 90 tablet 1   No current facility-administered medications for this visit.     PHYSICAL EXAMINATION: ECOG PERFORMANCE STATUS: 0 - Asymptomatic  Vitals:   02/10/19 1251  BP: 137/65  Pulse: (!) 56  Resp: 18  Temp: 99.8 F (37.7 C)  SpO2: 94%   Filed Weights   02/10/19 1251  Weight: 201 lb 8 oz (91.4 kg)    GENERAL:alert, no distress and comfortable SKIN: skin color, texture, turgor are normal, no rashes or significant lesions EYES: normal, Conjunctiva are pink and non-injected, sclera clear  NECK: supple, thyroid normal size, non-tender, without nodularity LYMPH:  no palpable lymphadenopathy in the cervical, axillary  LUNGS: clear to auscultation and percussion with normal breathing effort HEART: regular rate & rhythm and no murmurs and no lower extremity edema ABDOMEN:abdomen soft, non-tender and normal bowel sounds Musculoskeletal:no cyanosis of digits and no clubbing  NEURO: alert & oriented x 3 with fluent speech, no focal motor/sensory deficits BREAST: (+) b/l lumpy breast tissue especially around nipple (+) multiple small lumps around right nipple with largest 0.5cm lump at 3:00 position, likely from biopsy. No adenopathy bilaterally. Left breast and axilla exam benign.  LABORATORY DATA:  I have reviewed the data as listed CBC Latest Ref Rng & Units 02/10/2019 12/14/2018 03/12/2018  WBC 4.0 - 10.5 K/uL 9.1 9.0 12.9(H)  Hemoglobin 12.0 - 15.0 g/dL 13.6 12.7 12.1  Hematocrit 36.0 - 46.0 % 41.7 38.3 36.4  Platelets 150 - 400 K/uL 294 291 304    CMP Latest Ref Rng & Units 02/10/2019 12/14/2018 05/07/2018  Glucose 70 - 99 mg/dL 114(H) 123(H) 101(H)  BUN 8 - 23 mg/dL _0 Creatinine 0.44 - 1.00 mg/dL 0.95 1.15(H) 0.83  Sodium 135 - 145 mmol/L 140 139 142  Potassium 3.5 - 5.1 mmol/L 3.5 3.6 3.8  Chloride 98 - 111 mmol/L 105 102 103  CO2 22 - 32 mmol/L _1 Calcium 8.9  -  10.3 mg/dL 9.2 9.4 9.6  Total Protein 6.5 - 8.1 g/dL 8.2(H) 7.2 7.5  Total Bilirubin 0.3 - 1.2 mg/dL 0.4 0.4 0.4  Alkaline Phos 38 - 126 U/L 85 - -  AST 15 - 41 U/L _0 ALT 0 - 44 U/L _1 RADIOGRAPHIC STUDIES: I have personally reviewed the radiological images as listed and agreed with the findings in the report. Mm Diag Breast Tomo Uni Right  Result Date: 01/28/2019 CLINICAL DATA:  Patient was recalled from screening mammogram for right breast calcifications EXAM: DIGITAL DIAGNOSTIC UNILATERAL RIGHT MAMMOGRAM WITH CAD AND TOMO COMPARISON:  Previous exam(s). ACR Breast Density Category b: There are scattered areas of fibroglandular density. FINDINGS: In the retroareolar lateral right breast there are grouped pleomorphic calcifications spanning approximately 7 mm. No other suspicious findings are identified in the right breast. IMPRESSION: Right breast retroareolar 7 mm grouped pleomorphic calcifications are suspicious. RECOMMENDATION: Stereotactic core needle biopsy is recommended for the right breast calcifications. I have discussed the findings and recommendations with the patient. Results were also provided in writing at the conclusion of the visit. If applicable, a reminder letter will be sent to the patient regarding the next appointment. BI-RADS CATEGORY  4: Suspicious. Electronically Signed   By: Audie Pinto M.D.   On: 01/28/2019 11:23   Mm 3d Screen Breast Bilateral  Result Date: 01/22/2019 CLINICAL DATA:  Screening. EXAM: DIGITAL SCREENING BILATERAL MAMMOGRAM WITH TOMO AND CAD COMPARISON:  Previous exam(s). ACR Breast Density Category b: There are scattered areas of fibroglandular density. FINDINGS: In the right breast, calcifications warrant further evaluation with magnified views. In the left breast, no findings suspicious for malignancy. Images were processed with CAD. IMPRESSION: Further evaluation is suggested for calcifications in the right breast.  RECOMMENDATION: Diagnostic mammogram of the right breast. (Code:[id number]) The patient will be contacted regarding the findings, and additional imaging will be scheduled. BI-RADS CATEGORY  0: Incomplete. Need additional imaging evaluation and/or prior mammograms for comparison. Electronically Signed   By: Ammie Ferrier M.D.   On: 01/22/2019 14:13   Mm Clip Placement Right  Result Date: 02/02/2019 CLINICAL DATA:  Evaluate COIL clip placement following stereotactic guided RIGHT breast biopsy. EXAM: DIAGNOSTIC RIGHT MAMMOGRAM POST STEREOTACTIC BIOPSY COMPARISON:  Previous exam(s). FINDINGS: Mammographic images were obtained following stereotactic guided biopsy of 0.7 cm group of calcifications within the OUTER RETROAREOLAR RIGHT breast. The COIL clip lies 0.5 cm posterior to the biopsy cavity/remaining calcifications. IMPRESSION: COIL clip is position 0.5 cm posterior to the biopsy cavity/remaining calcifications. Final Assessment: Post Procedure Mammograms for Marker Placement Electronically Signed   By: Margarette Canada M.D.   On: 02/02/2019 09:53   Mm Rt Breast Bx W Loc Dev 1st Lesion Image Bx Spec Stereo Guide  Addendum Date: 02/04/2019   ADDENDUM REPORT: 02/04/2019 06:57 ADDENDUM: Pathology revealed INTERMEDIATE GRADE DUCTAL CARCINOMA IN SITU WITH CALCIFICATIONS of the Right breast, outer retroareolar. This was found to be concordant by Dr. Hassan Rowan. Pathology results were discussed with the patient by telephone. The patient reported doing well after the biopsy with tenderness at the site. Post biopsy instructions and care were reviewed and questions were answered. The patient was encouraged to call [facility] for any additional concerns. The patient was referred to [facility] on February 10, 2019. Pathology results reported by Terie Purser, RN on 02/04/2019. Electronically Signed   By: Margarette Canada  M.D.    On: 02/04/2019 06:57   Result Date: 02/04/2019 CLINICAL DATA:  74 year old female for tissue sampling of 0.7 cm group of calcifications within the OUTER RETROAREOLAR RIGHT breast. EXAM: RIGHT BREAST STEREOTACTIC CORE NEEDLE BIOPSY COMPARISON:  Previous exams. FINDINGS: The patient and I discussed the procedure of stereotactic-guided biopsy including benefits and alternatives. We discussed the high likelihood of a successful procedure. We discussed the risks of the procedure including infection, bleeding, tissue injury, clip migration, and inadequate sampling. Informed written consent was given. The usual time out protocol was performed immediately prior to the procedure. Using sterile technique and 1% Lidocaine as local anesthetic, under stereotactic guidance, a 9 gauge vacuum assisted device was used to perform core needle biopsy of the 0.7 cm group of calcifications within the OUTER RETROAREOLAR RIGHT breast using a LATERAL approach. Specimen radiograph was performed showing calcifications. Specimens with calcifications are identified for pathology. Lesion quadrant: OUTER RETROAREOLAR RIGHT breast At the conclusion of the procedure, a COIL tissue marker clip was deployed into the biopsy cavity. Follow-up 2-view mammogram was performed and dictated separately. IMPRESSION: Stereotactic-guided biopsy of OUTER RETROAREOLAR RIGHT breast calcifications. No apparent complications. Electronically Signed: By: Margarette Canada M.D. On: 02/02/2019 09:55    ASSESSMENT & PLAN:  Adrienne [name] is a 73 y.o. African American female with a history of CHF, arthritis, HLD, HTN, pre-diabetic, thyroid disease.  1. Ductal carcinoma in situ (DCIS) of right breast, Stage 0, ER+/PR+, Intermediate Grade -I discussed her breast imaging and needle biopsy results with patient and her family members in great detail. -She is a candidate for breast conservation surgery. She has been seen by breast surgeon Dr. Marlou Starks, who discussed  standard therapy with lumpectomy, vs considering the COMET trail which will randomize her to surgery versus observation.  Given her small size of the DCIS and intermediate grade DCIS, she would be a candidate.  If she participates the trial and is randomized to observation, she has option of taking antiestrogen therapy while being observed.  -If she chooses to proceed with surgery, her DCIS will be cured by complete surgical resection. Any form of adjuvant therapy is preventive. -We discussed the role of adjuvant antiestrogen therapy, such as tamoxifen or anastrozole, to reduce her risk of future breast cancer, but not necessary to increase her survival  -She will discuss adjuvant breast radiation if she undergo lumpectomy to decrease the risk of breast cancer with Dr. Isidore Moos today.  -We also discussed that biopsy may have sampling limitation, we will review her surgical path, to see if she has any invasive carcinoma components. -We discussed breast cancer surveillance after she completes treatment, Including annual mammogram, breast exam every 6-12 months. -Labs reviewed, CBC and CMP WNL except BG 114. Physical exam unremarkable except lumpy breast tissue especially around nipple, largest likely from breast biopsy.  -F/u open, she will call us about her surgical decision. I will see her if she is interested in antiestrogen therapy.    PLAN:  -F/u open, depends on her decision about surgery vs participating COMIT trial    No orders of the defined types were placed in this encounter.   All questions were answered. The patient knows to call the clinic with any problems, questions or concerns. I spent 30 minutes counseling the patient face to face. The total time spent in the appointment was 40 minutes and more than 50% was on counseling.     Truitt Merle, MD 02/10/2019 4:12 PM  I, Joslyn Devon, am acting  as scribe for Truitt Merle, MD.   I have reviewed the above documentation for accuracy and  completeness, and I agree with the above.

## 2019-02-09 NOTE — Progress Notes (Signed)
Radiation Oncology         [phone]) [phone] ________________________________  Initial outpatient Consultation  Name: Adrienne [name] MRN: [id number]  Date: 02/10/2019  DOB: 1945-12-25  FO:[id number], Cammie Mcgee, MD  Jovita Kussmaul, MD   REFERRING PHYSICIAN: Jovita Kussmaul, MD  DIAGNOSIS:    ICD-10-CM   1. Ductal carcinoma in situ (DCIS) of right breast  D05.11    Stage 0 Right Breast DCIS, ER+ / PR+, Grade 2  CHIEF COMPLAINT: Here to discuss management of right DCIS  HISTORY OF PRESENT ILLNESS:Adrienne [name] is a 73 y.o. femalewho presented with breast abnormality on the following imaging: screening mammogram on the date of 01/22/2019.  Symptoms, if any, at that time, were: none.   Right diagnostic mammogram on 01/28/2019 revealed 7 mm pleomorphic calcifications in the retroareolar lateral right breast.   Biopsy on date of 02/02/2019 showed ductal carcinoma in situ with calcifications.  ER status: 90% positive; PR status 20% positive; Grade 2.  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  She is in her USOH.  PREVIOUS RADIATION THERAPY: No  PAST MEDICAL HISTORY:  has a past medical history of Ambulates with cane, Arthritis, CHF (congestive heart failure) ([place]), Ductal carcinoma in situ of breast, Dysrhythmia, Headache, History of blood transfusion (1986), Hyperlipidemia, Hypertension, Hypothyroidism, Knee pain, Pelvic kidney, Pre-diabetes, Prediabetes, SBO (small bowel obstruction) ([place]) (10/2016), SBO (small bowel obstruction) ([place]) (10/2016), Shoulder pain, Sleep apnea, Smoker, Thyroid disease, and Tinnitus.    PAST SURGICAL HISTORY: Past Surgical History:  Procedure Laterality Date  . ABDOMINAL HYSTERECTOMY  1986   COMPLETE  . BACK SURGERY     lower back  . CHOLECYSTECTOMY    . COLONOSCOPY  02/15/2008   [name]   . ECTOPIC PREGNANCY SURGERY    . JOINT REPLACEMENT     Left hip total Dr. [name] 10-01-17  . polypectomy-oropharynx    . right knee meniscus    . SBO  10/2016   small bowel  obstruction  . TOTAL HIP ARTHROPLASTY Left 10/01/2017   Procedure: LEFT  TOTAL HIP ARTHROPLASTY ANTERIOR APPROACH;  Surgeon: [name], MD;  Location: [facility];  Service: Orthopedics;  Laterality: Left;  . TOTAL HIP ARTHROPLASTY Right 03/11/2018   Procedure: RIGHT TOTAL HIP ARTHROPLASTY ANTERIOR APPROACH;  Surgeon: [name], MD;  Location: [facility];  Service: Orthopedics;  Laterality: Right;  131min  . UPPER GASTROINTESTINAL ENDOSCOPY      FAMILY HISTORY: family history includes Breast cancer in her sister; Cancer in her sister; Diabetes in her brother and brother; Hypertension in an other family member.  SOCIAL HISTORY:  reports that she quit smoking about 2 years ago. Her smoking use included e-cigarettes and cigarettes. She has a 32.00 pack-year smoking history. She has never used smokeless tobacco. She reports that she does not drink alcohol or use drugs.  ALLERGIES: Lisinopril and Atorvastatin  MEDICATIONS:  Current Outpatient Medications  Medication Sig Dispense Refill  . amLODipine (NORVASC) 10 MG tablet Take 1 tablet (10 mg total) by mouth daily. 90 tablet 2  . carvedilol (COREG) 3.125 MG tablet Take 1 tablet (3.125 mg total) by mouth 2 (two) times daily with a meal. 60 tablet 5  . hydrochlorothiazide (HYDRODIURIL) 25 MG tablet TAKE 1 TABLET BY MOUTH ONCE DAILY 90 tablet 2  . levothyroxine (SYNTHROID, LEVOTHROID) 75 MCG tablet Take 1 tablet (75 mcg total) by mouth daily. 90 tablet 1   No current facility-administered medications for this encounter.     REVIEW OF SYSTEMS: As above.  PHYSICAL EXAM:  Vitals - 1 value per visit 2/37/6283  SYSTOLIC 151  DIASTOLIC 65  Pulse 56  Temperature 99.8  Respirations 18  Weight (lb) 201.5  Height 5\' 4"   BMI 34.59  VISIT REPORT    General: Alert and oriented, in no acute distress  Psychiatric: Judgment and insight are intact. Affect is appropriate. Breasts: right breast, there is a post biopsy mass just lateral to the areola,  approx 2cm in size . No other palpable masses appreciated in the breasts or axillae bilaterally  .    ECOG = 1 0 - Asymptomatic (Fully active, able to carry on all predisease activities without restriction)  1 - Symptomatic but completely ambulatory (Restricted in physically strenuous activity but ambulatory and able to carry out work of a light or sedentary nature. For example, light housework, office work)  2 - Symptomatic, <50% in bed during the day (Ambulatory and capable of all self care but unable to carry out any work activities. Up and about more than 50% of waking hours)  3 - Symptomatic, >50% in bed, but not bedbound (Capable of only limited self-care, confined to bed or chair 50% or more of waking hours)  4 - Bedbound (Completely disabled. Cannot carry on any self-care. Totally confined to bed or chair)  5 - Death   Eustace Pen MM, Creech RH, Tormey DC, et al. [id number]). "Toxicity and response criteria of the [facility]". [place] Oncol. 5 (6): 649-55   LABORATORY DATA:  Lab Results  Component Value Date   WBC 9.1 02/10/2019   HGB 13.6 02/10/2019   HCT 41.7 02/10/2019   MCV 91.9 02/10/2019   PLT 294 02/10/2019   CMP     Component Value Date/Time   NA 140 02/10/2019 1234   K 3.5 02/10/2019 1234   CL 105 02/10/2019 1234   CO2 24 02/10/2019 1234   GLUCOSE 114 (H) 02/10/2019 1234   BUN 13 02/10/2019 1234   CREATININE 0.95 02/10/2019 1234   CREATININE 1.15 (H) 12/14/2018 0933   CALCIUM 9.2 02/10/2019 1234   PROT 8.2 (H) 02/10/2019 1234   ALBUMIN 3.8 02/10/2019 1234   AST 21 02/10/2019 1234   ALT 15 02/10/2019 1234   ALKPHOS 85 02/10/2019 1234   BILITOT 0.4 02/10/2019 1234   GFRNONAA >60 02/10/2019 1234   GFRNONAA 48 (L) 12/14/2018 0933   GFRAA >60 02/10/2019 1234   GFRAA 55 (L) 12/14/2018 0933         RADIOGRAPHY: Mm Diag Breast Tomo Uni Right  Result Date: 01/28/2019 CLINICAL DATA:  Patient was recalled from screening mammogram for  right breast calcifications EXAM: DIGITAL DIAGNOSTIC UNILATERAL RIGHT MAMMOGRAM WITH CAD AND TOMO COMPARISON:  Previous exam(s). ACR Breast Density Category b: There are scattered areas of fibroglandular density. FINDINGS: In the retroareolar lateral right breast there are grouped pleomorphic calcifications spanning approximately 7 mm. No other suspicious findings are identified in the right breast. IMPRESSION: Right breast retroareolar 7 mm grouped pleomorphic calcifications are suspicious. RECOMMENDATION: Stereotactic core needle biopsy is recommended for the right breast calcifications. I have discussed the findings and recommendations with the patient. Results were also provided in writing at the conclusion of the visit. If applicable, a reminder letter will be sent to the patient regarding the next appointment. BI-RADS CATEGORY  4: Suspicious. Electronically Signed   By: Audie Pinto M.D.   On: 01/28/2019 11:23   Mm 3d Screen Breast Bilateral  Result Date: 01/22/2019 CLINICAL DATA:  Screening. EXAM: DIGITAL  SCREENING BILATERAL MAMMOGRAM WITH TOMO AND CAD COMPARISON:  Previous exam(s). ACR Breast Density Category b: There are scattered areas of fibroglandular density. FINDINGS: In the right breast, calcifications warrant further evaluation with magnified views. In the left breast, no findings suspicious for malignancy. Images were processed with CAD. IMPRESSION: Further evaluation is suggested for calcifications in the right breast. RECOMMENDATION: Diagnostic mammogram of the right breast. (Code:[id number]) The patient will be contacted regarding the findings, and additional imaging will be scheduled. BI-RADS CATEGORY  0: Incomplete. Need additional imaging evaluation and/or prior mammograms for comparison. Electronically Signed   By: Ammie Ferrier M.D.   On: 01/22/2019 14:13   Mm Clip Placement Right  Result Date: 02/02/2019 CLINICAL DATA:  Evaluate COIL clip placement following stereotactic  guided RIGHT breast biopsy. EXAM: DIAGNOSTIC RIGHT MAMMOGRAM POST STEREOTACTIC BIOPSY COMPARISON:  Previous exam(s). FINDINGS: Mammographic images were obtained following stereotactic guided biopsy of 0.7 cm group of calcifications within the OUTER RETROAREOLAR RIGHT breast. The COIL clip lies 0.5 cm posterior to the biopsy cavity/remaining calcifications. IMPRESSION: COIL clip is position 0.5 cm posterior to the biopsy cavity/remaining calcifications. Final Assessment: Post Procedure Mammograms for Marker Placement Electronically Signed   By: Margarette Canada M.D.   On: 02/02/2019 09:53   Mm Rt Breast Bx W Loc Dev 1st Lesion Image Bx Spec Stereo Guide  Addendum Date: 02/04/2019   ADDENDUM REPORT: 02/04/2019 06:57 ADDENDUM: Pathology revealed INTERMEDIATE GRADE DUCTAL CARCINOMA IN SITU WITH CALCIFICATIONS of the Right breast, outer retroareolar. This was found to be concordant by Dr. Hassan Rowan. Pathology results were discussed with the patient by telephone. The patient reported doing well after the biopsy with tenderness at the site. Post biopsy instructions and care were reviewed and questions were answered. The patient was encouraged to call [facility] for any additional concerns. The patient was referred to [facility] on February 10, 2019. Pathology results reported by Terie Purser, RN on 02/04/2019. Electronically Signed   By: Margarette Canada M.D.   On: 02/04/2019 06:57   Result Date: 02/04/2019 CLINICAL DATA:  73 year old female for tissue sampling of 0.7 cm group of calcifications within the OUTER RETROAREOLAR RIGHT breast. EXAM: RIGHT BREAST STEREOTACTIC CORE NEEDLE BIOPSY COMPARISON:  Previous exams. FINDINGS: The patient and I discussed the procedure of stereotactic-guided biopsy including benefits and alternatives. We discussed the high likelihood of a successful procedure. We discussed the risks of the  procedure including infection, bleeding, tissue injury, clip migration, and inadequate sampling. Informed written consent was given. The usual time out protocol was performed immediately prior to the procedure. Using sterile technique and 1% Lidocaine as local anesthetic, under stereotactic guidance, a 9 gauge vacuum assisted device was used to perform core needle biopsy of the 0.7 cm group of calcifications within the OUTER RETROAREOLAR RIGHT breast using a LATERAL approach. Specimen radiograph was performed showing calcifications. Specimens with calcifications are identified for pathology. Lesion quadrant: OUTER RETROAREOLAR RIGHT breast At the conclusion of the procedure, a COIL tissue marker clip was deployed into the biopsy cavity. Follow-up 2-view mammogram was performed and dictated separately. IMPRESSION: Stereotactic-guided biopsy of OUTER RETROAREOLAR RIGHT breast calcifications. No apparent complications. Electronically Signed: By: Margarette Canada M.D. On: 02/02/2019 09:55      IMPRESSION/PLAN: Right Breast DCIS   We discussed the COMET trial which would allow her to forgo surgery and aggressive treatment, but involve close surveillance. She is interested in learning more and I  will arrange this with our nurse navigators.  Alternatively she could undergo lumpectomy to remove the DCIS. Radiotherapy after lumpectomy would be reasonable but optional.  It was a pleasure meeting the patient today. We discussed the risks, benefits, and side effects of radiotherapy. Post op radiotherapy to the right breast to reduce her risk of locoregional recurrence by 1/2.  We discussed that radiation would take approximately 3-4 weeks to complete and that I would give the patient a few weeks to heal following surgery before starting treatment planning. We spoke about acute effects including skin irritation and fatigue as well as much less common late effects including internal organ injury or irritation. We spoke about  the latest technology that is used to minimize the risk of late effects for patients undergoing radiotherapy to the breast or chest wall. No guarantees of treatment were given.   She needs to learn more about COMET and will think about her options. I will await her referral back to me for postoperative follow-up and eventual CT simulation/treatment planning, if warranted.  I spent at least 20 minutes face to face with the patient and more than 50% of that time was spent in counseling and/or coordination of care.   __________________________________________   Eppie Gibson, MD   This document serves as a record of services personally performed by Eppie Gibson, MD. It was created on her behalf by Wilburn Mylar, a trained medical scribe. The creation of this record is based on the scribe's personal observations and the provider's statements to them. This document has been checked and approved by the attending provider.

## 2019-02-10 ENCOUNTER — Inpatient Hospital Stay: Payer: PPO

## 2019-02-10 ENCOUNTER — Ambulatory Visit
Admission: RE | Admit: 2019-02-10 | Discharge: 2019-02-10 | Disposition: A | Discharge status: Home / Self Care | Payer: PPO | Source: Ambulatory Visit | Attending: Radiation Oncology | Admitting: Radiation Oncology

## 2019-02-10 ENCOUNTER — Inpatient Hospital Stay: Payer: PPO | Attending: Hematology | Admitting: Hematology

## 2019-02-10 ENCOUNTER — Encounter: Payer: Self-pay | Admitting: Hematology

## 2019-02-10 ENCOUNTER — Ambulatory Visit: Payer: Self-pay | Admitting: General Surgery

## 2019-02-10 ENCOUNTER — Other Ambulatory Visit: Payer: Self-pay

## 2019-02-10 ENCOUNTER — Ambulatory Visit: Payer: PPO | Admitting: Physical Therapy

## 2019-02-10 VITALS — BP 137/65 | HR 56 | Temp 99.8°F | Resp 18 | Ht 64.0 in | Wt 201.5 lb

## 2019-02-10 DIAGNOSIS — Z803 Family history of malignant neoplasm of breast: Secondary | ICD-10-CM | POA: Diagnosis not present

## 2019-02-10 DIAGNOSIS — D0511 Intraductal carcinoma in situ of right breast: Secondary | ICD-10-CM

## 2019-02-10 DIAGNOSIS — Z87891 Personal history of nicotine dependence: Secondary | ICD-10-CM

## 2019-02-10 DIAGNOSIS — Z170 Estrogen receptor positive status [ER+]: Secondary | ICD-10-CM

## 2019-02-10 LAB — CMP (CANCER CENTER ONLY)
ALT: 15 U/L (ref 0–44)
AST: 21 U/L (ref 15–41)
Albumin: 3.8 g/dL (ref 3.5–5.0)
Alkaline Phosphatase: 85 U/L (ref 38–126)
Anion gap: 11 (ref 5–15)
BUN: 13 mg/dL (ref 8–23)
CO2: 24 mmol/L (ref 22–32)
Calcium: 9.2 mg/dL (ref 8.9–10.3)
Chloride: 105 mmol/L (ref 98–111)
Creatinine: 0.95 mg/dL (ref 0.44–1.00)
GFR, Est AFR Am: >60 mL/min (ref >60)
GFR, Estimated: >60 mL/min (ref >60)
Glucose, Bld: 114 mg/dL — ABNORMAL HIGH (ref 70–99)
Potassium: 3.5 mmol/L (ref 3.5–5.1)
Sodium: 140 mmol/L (ref 135–145)
Total Bilirubin: 0.4 mg/dL (ref 0.3–1.2)
Total Protein: 8.2 g/dL — ABNORMAL HIGH (ref 6.5–8.1)

## 2019-02-10 LAB — CBC WITH DIFFERENTIAL (CANCER CENTER ONLY)
Abs Immature Granulocytes: 0.02 10*3/uL (ref 0.00–0.07)
Basophils Absolute: 0 10*3/uL (ref 0.0–0.1)
Basophils Relative: 0 %
Eosinophils Absolute: 0.1 10*3/uL (ref 0.0–0.5)
Eosinophils Relative: 1 %
HCT: 41.7 % (ref 36.0–46.0)
Hemoglobin: 13.6 g/dL (ref 12.0–15.0)
Immature Granulocytes: 0 %
Lymphocytes Relative: 32 %
Lymphs Abs: 2.9 10*3/uL (ref 0.7–4.0)
MCH: 30 pg (ref 26.0–34.0)
MCHC: 32.6 g/dL (ref 30.0–36.0)
MCV: 91.9 fL (ref 80.0–100.0)
Monocytes Absolute: 0.5 10*3/uL (ref 0.1–1.0)
Monocytes Relative: 5 %
Neutro Abs: 5.6 10*3/uL (ref 1.7–7.7)
Neutrophils Relative %: 62 %
Platelet Count: 294 10*3/uL (ref 150–400)
RBC: 4.54 MIL/uL (ref 3.87–5.11)
RDW: 14.9 % (ref 11.5–15.5)
WBC Count: 9.1 10*3/uL (ref 4.0–10.5)
nRBC: 0 % (ref 0.0–0.2)

## 2019-02-11 ENCOUNTER — Encounter: Payer: Self-pay | Admitting: Radiation Oncology

## 2019-02-11 ENCOUNTER — Telehealth: Payer: Self-pay | Admitting: Hematology

## 2019-02-11 NOTE — Telephone Encounter (Signed)
No los per 8/19. °

## 2019-02-17 ENCOUNTER — Other Ambulatory Visit: Payer: Self-pay | Admitting: General Surgery

## 2019-02-17 ENCOUNTER — Telehealth: Payer: Self-pay

## 2019-02-17 DIAGNOSIS — D0511 Intraductal carcinoma in situ of right breast: Secondary | ICD-10-CM

## 2019-02-17 NOTE — Telephone Encounter (Signed)
Nutrition Assessment  Reason for Assessment:  Pt attended Breast Clinic on 8/19 and received nutrition packet from nurse navigator  ASSESSMENT:  73 year old female with new diagnosis of breast cancer.  Patient to decide on treatment with surgery vs participating in the COMET trial.  Past medical history of CHF, prediabetes, HTN, HLD, GERD, COPD, CAD.    Spoke with patient via phone this am to introduce self and service at [facility].    Medications:  reviewed  Labs: reviewed  Anthropometrics:   Height: 64 inches Weight: 201 lb BMI: 34   NUTRITION DIAGNOSIS: Food and nutrition related knowledge deficit related to new diagnosis of breast cancer as evidenced by no prior need for nutrition related information.  INTERVENTION:   Discussed briefly packet of information regarding nutritional tips for breast cancer patients.  No questions at this time.  Contact information provided and patient knows to contact me with questions/concerns.    MONITORING, EVALUATION, and GOAL: Pt will consume a healthy plant based diet to maintain lean body mass throughout treatment.   Micalah Cabezas B. Zenia Resides, [place], [place] Registered Dietitian [phone] (pager)

## 2019-02-18 ENCOUNTER — Telehealth: Payer: Self-pay | Admitting: *Deleted

## 2019-02-18 ENCOUNTER — Encounter: Payer: Self-pay | Admitting: *Deleted

## 2019-02-18 ENCOUNTER — Other Ambulatory Visit: Payer: Self-pay | Admitting: *Deleted

## 2019-02-18 DIAGNOSIS — D0511 Intraductal carcinoma in situ of right breast: Secondary | ICD-10-CM

## 2019-02-18 NOTE — Telephone Encounter (Signed)
Spoke with patient from [facility].  Patient has decided to have surgery and she is scheduled for 9/24.  Referral placed for Dr. Isidore Moos.  Encouraged her to call with any needs or concerns.

## 2019-02-25 ENCOUNTER — Encounter: Payer: Self-pay | Admitting: Cardiovascular Disease

## 2019-02-25 ENCOUNTER — Ambulatory Visit (INDEPENDENT_AMBULATORY_CARE_PROVIDER_SITE_OTHER): Payer: PPO | Admitting: Cardiovascular Disease

## 2019-02-25 ENCOUNTER — Other Ambulatory Visit: Payer: Self-pay

## 2019-02-25 VITALS — BP 112/70 | HR 56 | Ht 64.0 in | Wt 197.4 lb

## 2019-02-25 DIAGNOSIS — I5022 Chronic systolic (congestive) heart failure: Secondary | ICD-10-CM | POA: Diagnosis not present

## 2019-02-25 DIAGNOSIS — R001 Bradycardia, unspecified: Secondary | ICD-10-CM

## 2019-02-25 NOTE — Patient Instructions (Signed)
Medication Instructions:  Your physician recommends that you continue on your current medications as directed. Please refer to the Current Medication list given to you today.  If you need a refill on your cardiac medications before your next appointment, please call your pharmacy    Follow-Up: At [facility], you and your health needs are our priority.  As part of our continuing mission to provide you with exceptional heart care, we have created designated Provider Care Teams.  These Care Teams include your primary Cardiologist (physician) and Advanced Practice Providers (APPs -  Physician Assistants and Nurse Practitioners) who all work together to provide you with the care you need, when you need it. You will need a follow up appointment in:  1 year.  Please call our office 2 months in advance to schedule this appointment.  You may see Mertie Moores, MD or one of the following Advanced Practice Providers on your designated Care Team: Richardson Dopp, PA-C [place], [place] . Daune Perch, NP

## 2019-02-25 NOTE — Progress Notes (Signed)
Cardiology Office Note   Date:  02/25/2019   ID:  [name], [name] 1945/11/01, MRN [id number]  PCP:  Susy Frizzle, MD  Cardiologist:   Mertie Moores, MD   Chief Complaint  Patient presents with  . Congestive Heart Failure  . Hypertension    Problem list: 1. Chronic systolic congestive heart failure-EF of around 44% in 2010.  Her EF has now normalized.  2. Hypertension 3. Dyslipidemia   Adrienne [name] is a middle-age female with a history of hypertension, dyslipidemia, palpitations. She also has a history of chest pain. A nuclear stress test from March of 2010 was negative for ischemia.  She has not had episodes of chest pain or shortness of breath.  Jan. 5, 2015:  Adrienne [name] is seen today after a ~ 3 year absence. She retired last year and tried to get life insurance. She was told that she has "CHF" . Her echo and myoview from 2010 showed mildly reduced EF . The echo showed EF of 45-50%. Myoview EF was 41%.   Her medication doses are slightly lower than when we last saw her 3 years ago. She has gradually reduced her dose because of chronic headaches. She has been seeing a Karis Juba, NP at [place].  She still smokes - 1/2 ppd currently. She avoids salt. Does not eat out much.   December 31, 2013:  Adrienne [name] is doing ok. BP is much better than previous visit. BP at her medical doctors office . She stopped taking the HCTZ and potassium when she ran out of her prescription. Her blood pressure is better despite the fact that she's not on those medications, . She is planning on moving to [facility] within the next several months   August 25, 2014:    Adrienne [name] is a 73 y.o. female who presents for follow-up of her history of congestive heart failure, hyperlipidemia, and hypertension.  She is staying busy. No CP or dyspnea.  Takes all of her meds. No CP or dyspnea. Does not exercise   still smoking ,  Advised her to stop again   September 12, 2016:  No CP , no dyspnea.   Still smoking  - advised her to stop  Having trouble with her knees   December 05, 2017:  Adrienne [name] is seen today today for follow-up of her chronic systolic congestive heart failure, hypertension and hyperlipidemia. Quit smoking last year  Did not start the Rosuvastatin  Had hip replacement   Sept. 3, 2020 :  Adrienne [name] is seen today for follow-up of her chronic systolic congestive heart failure, hypertension, hyperlipidemia.  She quit smoking 2 years ago.  Was diagnosed with breast cancer 3 weeks ago . Right breast lumpectomy scheduled in several weeks.  Uses very little salt .   Past Medical History:  Diagnosis Date  . Ambulates with cane    straight cane  . Arthritis    knee, back  . CHF (congestive heart failure) ([facility])    EF 55-60%  pt. denies. Dr. did further test and could not confirm per pt.  . Ductal carcinoma in situ of breast   . Dysrhythmia    hx of - no current problems  . Headache    hx of migraines - no longer a problem  . History of blood transfusion 1986   w/ Hysterectomy surgery  . Hyperlipidemia    diet controlled - no meds  . Hypertension   . Hypothyroidism   . Knee  pain   . Pelvic kidney    lower right pelvic kidney   . Pre-diabetes   . Prediabetes    diet controlled - no meds  . SBO (small bowel obstruction) ([place]) 10/2016   surgery   . SBO (small bowel obstruction) ([place]) 10/2016  . Shoulder pain   . Sleep apnea    Mild - no mask needed per sleep study  . Smoker    quit smoking 2018  . Thyroid disease   . Tinnitus    BOTH EARS    Past Surgical History:  Procedure Laterality Date  . ABDOMINAL HYSTERECTOMY  1986   COMPLETE  . BACK SURGERY     lower back  . CHOLECYSTECTOMY    . COLONOSCOPY  02/15/2008   [name]   . ECTOPIC PREGNANCY SURGERY    . JOINT REPLACEMENT     Left hip total Dr. [name] 10-01-17  . polypectomy-oropharynx    . right knee meniscus    . SBO  10/2016   small bowel obstruction  . TOTAL HIP  ARTHROPLASTY Left 10/01/2017   Procedure: LEFT  TOTAL HIP ARTHROPLASTY ANTERIOR APPROACH;  Surgeon: [name], MD;  [facility];  Service: Orthopedics;  Laterality: Left;  . TOTAL HIP ARTHROPLASTY Right 03/11/2018   Procedure: RIGHT TOTAL HIP ARTHROPLASTY ANTERIOR APPROACH;  Surgeon: [name], MD;  [facility];  Service: Orthopedics;  Laterality: Right;  119min  . UPPER GASTROINTESTINAL ENDOSCOPY       Current Outpatient Medications  Medication Sig Dispense Refill  . amLODipine (NORVASC) 10 MG tablet Take 1 tablet (10 mg total) by mouth daily. 90 tablet 2  . carvedilol (COREG) 3.125 MG tablet Take 1 tablet (3.125 mg total) by mouth 2 (two) times daily with a meal. 60 tablet 5  . hydrochlorothiazide (HYDRODIURIL) 25 MG tablet TAKE 1 TABLET BY MOUTH ONCE DAILY 90 tablet 2  . levothyroxine (SYNTHROID, LEVOTHROID) 75 MCG tablet Take 1 tablet (75 mcg total) by mouth daily. 90 tablet 1   No current facility-administered medications for this visit.     Allergies:   Lisinopril and Atorvastatin    Social History:  The patient  reports that she quit smoking about 2 years ago. Her smoking use included e-cigarettes and cigarettes. She has a 32.00 pack-year smoking history. She has never used smokeless tobacco. She reports that she does not drink alcohol or use drugs.   Family History:  The patient's family history includes Breast cancer in her sister; Cancer in her sister; Diabetes in her brother and brother; Hypertension in an other family member.    ROS: Noted in current history, otherwise review of systems is negative.   Physical Exam: Blood pressure 112/70, pulse (!) 56, height 5\' 4"  (1.626 m), weight 197 lb 6.4 oz (89.5 kg), SpO2 97 %.  GEN:  Elderly female  HEENT: Normal NECK: No JVD; No carotid bruits LYMPHATICS: No lymphadenopathy CARDIAC: RRR , no murmurs, rubs, gallops RESPIRATORY:  Clear to auscultation without rales, wheezing or rhonchi  ABDOMEN: Soft,  non-tender, non-distended MUSCULOSKELETAL:  No edema; No deformity  SKIN: Warm and dry NEUROLOGIC:  Alert and oriented x 3   EKG:      Sept. 3, 2020 :   Sinus brady at 56.   Sinus arhythmias NS ST abn   Recent Labs: 12/14/2018: TSH 4.08 02/10/2019: ALT 15; BUN 13; Creatinine 0.95; Hemoglobin 13.6; Platelet Count 294; Potassium 3.5; Sodium 140    Lipid Panel    Component Value Date/Time  CHOL 266 (H) 12/14/2018 0933   TRIG 167 (H) 12/14/2018 0933   HDL 57 12/14/2018 0933   CHOLHDL 4.7 12/14/2018 0933   VLDL 20 04/08/2016 0933   LDLCALC 177 (H) 12/14/2018 0933      Wt Readings from Last 3 Encounters:  02/25/19 197 lb 6.4 oz (89.5 kg)  02/10/19 201 lb 8 oz (91.4 kg)  12/14/18 195 lb 8 oz (88.7 kg)      Other studies Reviewed: Additional studies/ records that were reviewed today include: . Review of the above records demonstrates:    ASSESSMENT AND PLAN: 1. Chronic systolic congestive heart failure-   EF is now normal.  2. Hypertension -   BP is well controlled.   3. Dyslipidemia managed by primary   Current medicines are reviewed at length with the patient today.  The patient does not have concerns regarding medicines.  The following changes have been made:  no change   Disposition:   FU with me in 1 year.     Signed, Mertie Moores, MD  02/25/2019 5:26 PM    [facility] [address], [place], [place]  [address] Phone: [phone]; Fax: [phone]

## 2019-03-01 DIAGNOSIS — M19072 Primary osteoarthritis, left ankle and foot: Secondary | ICD-10-CM | POA: Diagnosis not present

## 2019-03-01 DIAGNOSIS — M1990 Unspecified osteoarthritis, unspecified site: Secondary | ICD-10-CM | POA: Diagnosis not present

## 2019-03-01 DIAGNOSIS — M79675 Pain in left toe(s): Secondary | ICD-10-CM | POA: Diagnosis not present

## 2019-03-02 ENCOUNTER — Telehealth: Payer: Self-pay | Admitting: Family Medicine

## 2019-03-02 NOTE — Telephone Encounter (Signed)
She can take naprosyn up to 1 week before surgery.  It should be fine

## 2019-03-02 NOTE — Telephone Encounter (Signed)
Patient called in stating that she was seen at Urgent Care on yesterday for swelling to her second toe. States that they prescribed her Naproxen and she was unsure if she can take it due to her health hx and her upcoming surgery. Patient states that her oncologist instructed her to give [place] a call. Please advise?

## 2019-03-02 NOTE — Telephone Encounter (Signed)
Spoke with patient and informed her of that Dr.Pickard states it ok to take naproxen up until a week until surgery. Patient verbalized understanding.

## 2019-03-04 ENCOUNTER — Other Ambulatory Visit: Payer: Self-pay | Admitting: Family Medicine

## 2019-03-12 ENCOUNTER — Inpatient Hospital Stay (HOSPITAL_COMMUNITY): Admission: RE | Admit: 2019-03-12 | Payer: PPO | Source: Ambulatory Visit

## 2019-03-12 NOTE — Progress Notes (Signed)
[place] [place] [address] [place] [address] Phone: [phone] Fax: [phone]  [facility] [address], [place] [address] Phone: [phone] Fax: [phone]      Your procedure is scheduled on Thursday, September 24th, 2020.  Report to [facility] Main Entrance "A" at 6:30 A.M., and check in at the Admitting office.   Call this number if you have problems the morning of surgery:  [phone]  Call [phone] if you have any questions prior to your surgery date Monday-Friday 8am-4pm    Remember:  Do not eat after midnight the night before your surgery  You may drink clear liquids until 5:30AM the morning of your surgery.   Clear liquids allowed are: Water, Non-Citrus Juices (without pulp), Carbonated Beverages, Clear Tea, Black Coffee Only, and Gatorade    Take these medicines the morning of surgery with A SIP OF WATER: Amlodipine (Norvasc) Carvedilol (Coreg) Levothyroxine (Synthroid)  7 days prior to surgery STOP taking any Aspirin (unless otherwise instructed by your surgeon), Aleve, Naproxen, Ibuprofen, Motrin, Advil, Goody's, BC's, all herbal medications, fish oil, and all vitamins.    The Morning of Surgery  Do not wear jewelry, make-up or nail polish.  Do not wear lotions, powders, or perfumes/colognes, or deodorant  Do not shave 48 hours prior to surgery.  Men may shave face and neck.  Do not bring valuables to the hospital.  [facility] is not responsible for any belongings or valuables.  If you are a smoker, DO NOT Smoke 24 hours prior to surgery IF you wear a CPAP at night please bring your mask, tubing, and machine the morning of surgery   Remember that you must have someone to transport you home after your surgery, and remain with you for 24 hours if you are discharged the same day.   Contacts, glasses, hearing aids, dentures or bridgework may not be  worn into surgery.    Leave your suitcase in the car.  After surgery it may be brought to your room.  For patients admitted to the hospital, discharge time will be determined by your treatment team.  Patients discharged the day of surgery will not be allowed to drive home.    Special instructions:   [facility]- Preparing For Surgery  Before surgery, you can play an important role. Because skin is not sterile, your skin needs to be as free of germs as possible. You can reduce the number of germs on your skin by washing with CHG (chlorahexidine gluconate) Soap before surgery.  CHG is an antiseptic cleaner which kills germs and bonds with the skin to continue killing germs even after washing.    Oral Hygiene is also important to reduce your risk of infection.  Remember - BRUSH YOUR TEETH THE MORNING OF SURGERY WITH YOUR REGULAR TOOTHPASTE  Please do not use if you have an allergy to CHG or antibacterial soaps. If your skin becomes reddened/irritated stop using the CHG.  Do not shave (including legs and underarms) for at least 48 hours prior to first CHG shower. It is OK to shave your face.  Please follow these instructions carefully.   1. Shower the NIGHT BEFORE SURGERY and the MORNING OF SURGERY with CHG Soap.   2. If you chose to wash your hair, wash your hair first as usual with your normal shampoo.  3. After you shampoo, rinse your hair and body  thoroughly to remove the shampoo.  4. Use CHG as you would any other liquid soap. You can apply CHG directly to the skin and wash gently with a scrungie or a clean washcloth.   5. Apply the CHG Soap to your body ONLY FROM THE NECK DOWN.  Do not use on open wounds or open sores. Avoid contact with your eyes, ears, mouth and genitals (private parts). Wash Face and genitals (private parts)  with your normal soap.   6. Wash thoroughly, paying special attention to the area where your surgery will be performed.  7. Thoroughly rinse your body  with warm water from the neck down.  8. DO NOT shower/wash with your normal soap after using and rinsing off the CHG Soap.  9. Pat yourself dry with a CLEAN TOWEL.  10. Wear CLEAN PAJAMAS to bed the night before surgery, wear comfortable clothes the morning of surgery  11. Place CLEAN SHEETS on your bed the night of your first shower and DO NOT SLEEP WITH PETS.    Day of Surgery:  Do not apply any deodorants/lotions. Please shower the morning of surgery with the CHG soap  Please wear clean clothes to the hospital/surgery center.   Remember to brush your teeth WITH YOUR REGULAR TOOTHPASTE.   Please read over the following fact sheets that you were given.

## 2019-03-15 ENCOUNTER — Other Ambulatory Visit (HOSPITAL_COMMUNITY)
Admission: RE | Admit: 2019-03-15 | Discharge: 2019-03-15 | Disposition: A | Discharge status: Home / Self Care | Payer: PPO | Source: Ambulatory Visit | Attending: General Surgery | Admitting: General Surgery

## 2019-03-15 ENCOUNTER — Encounter (HOSPITAL_COMMUNITY)
Admission: RE | Admit: 2019-03-15 | Discharge: 2019-03-15 | Disposition: A | Discharge status: Home / Self Care | Payer: PPO | Source: Ambulatory Visit | Attending: General Surgery | Admitting: General Surgery

## 2019-03-15 ENCOUNTER — Encounter (HOSPITAL_COMMUNITY): Payer: Self-pay

## 2019-03-15 ENCOUNTER — Other Ambulatory Visit: Payer: Self-pay

## 2019-03-15 ENCOUNTER — Other Ambulatory Visit (HOSPITAL_COMMUNITY): Payer: PPO

## 2019-03-15 DIAGNOSIS — Z20828 Contact with and (suspected) exposure to other viral communicable diseases: Secondary | ICD-10-CM | POA: Diagnosis not present

## 2019-03-15 DIAGNOSIS — Z01812 Encounter for preprocedural laboratory examination: Secondary | ICD-10-CM | POA: Insufficient documentation

## 2019-03-15 LAB — BASIC METABOLIC PANEL
Anion gap: 8 (ref 5–15)
BUN: 16 mg/dL (ref 8–23)
CO2: 27 mmol/L (ref 22–32)
Calcium: 9.4 mg/dL (ref 8.9–10.3)
Chloride: 105 mmol/L (ref 98–111)
Creatinine, Ser: 0.96 mg/dL (ref 0.44–1.00)
GFR calc Af Amer: >60 mL/min (ref >60)
GFR calc non Af Amer: 60 mL/min — ABNORMAL LOW (ref >60)
Glucose, Bld: 128 mg/dL — ABNORMAL HIGH (ref 70–99)
Potassium: 4.5 mmol/L (ref 3.5–5.1)
Sodium: 140 mmol/L (ref 135–145)

## 2019-03-15 LAB — CBC
HCT: 43.5 % (ref 36.0–46.0)
Hemoglobin: 14.4 g/dL (ref 12.0–15.0)
MCH: 30.7 pg (ref 26.0–34.0)
MCHC: 33.1 g/dL (ref 30.0–36.0)
MCV: 92.8 fL (ref 80.0–100.0)
Platelets: 282 10*3/uL (ref 150–400)
RBC: 4.69 MIL/uL (ref 3.87–5.11)
RDW: 14.6 % (ref 11.5–15.5)
WBC: 10.3 10*3/uL (ref 4.0–10.5)
nRBC: 0 % (ref 0.0–0.2)

## 2019-03-15 LAB — SARS CORONAVIRUS 2 (TAT 6-24 HRS): SARS Coronavirus 2: NEGATIVE

## 2019-03-15 NOTE — Progress Notes (Addendum)
PCP - Dr. Jenna Luo Cardiologist - Dr. Cathie Olden  PPM/ICD - N/A Device Orders -N/A  Rep Notified  Chest x-ray - N/A EKG - 02/25/19 Stress Test - 2010 ECHO - 07/15/13 Cardiac Cath - denies  Sleep Study - OSA+  CPAP - denies use  Blood Thinner Instructions:N/A Aspirin Instructions:N/A  ERAS Protocol -yes PRE-SURGERY Ensure - N/A   COVID TEST- 03/15/19 after PAT appointment.    Anesthesia review: Yes, heart hx and EKG review. Pt seen by Dr. Cathie Olden on 02/25/19, see note.   Patient denies shortness of breath, fever, cough and chest pain at PAT appointment   Patient verbalized understanding of instructions that were given to them at the PAT appointment. Patient was also instructed that they will need to review over the PAT instructions again at home before surgery.   Coronavirus Screening  Have you experienced the following symptoms:  Cough yes/no: No Fever (>100.73F)  yes/no: No Runny nose yes/no: No Sore throat yes/no: No Difficulty breathing/shortness of breath  yes/no: No  Have you or a family member traveled in the last 14 days and where? yes/no: No   If the patient indicates "YES" to the above questions, their PAT will be rescheduled to limit the exposure to others and, the surgeon will be notified. THE PATIENT WILL NEED TO BE ASYMPTOMATIC FOR 14 DAYS.   If the patient is not experiencing any of these symptoms, the PAT nurse will instruct them to NOT bring anyone with them to their appointment since they may have these symptoms or traveled as well.   Please remind your patients and families that hospital visitation restrictions are in effect and the importance of the restrictions.

## 2019-03-16 NOTE — Anesthesia Preprocedure Evaluation (Addendum)
Anesthesia Evaluation  Patient identified by MRN, date of birth, ID band Patient awake    Reviewed: Allergy & Precautions, NPO status , Patient's Chart, lab work & pertinent test results  Airway Mallampati: II  TM Distance: >3 FB Neck ROM: Full    Dental no notable dental hx.    Pulmonary sleep apnea , former smoker,    Pulmonary exam normal breath sounds clear to auscultation       Cardiovascular hypertension, Pt. on medications +CHF  Normal cardiovascular exam Rhythm:Regular Rate:Normal     Neuro/Psych  Headaches, negative psych ROS   GI/Hepatic negative GI ROS, Neg liver ROS,   Endo/Other  Hypothyroidism   Renal/GU negative Renal ROS  negative genitourinary   Musculoskeletal  (+) Arthritis ,   Abdominal (+) + obese,   Peds negative pediatric ROS (+)  Hematology negative hematology ROS (+)   Anesthesia Other Findings   Reproductive/Obstetrics negative OB ROS                            Anesthesia Physical Anesthesia Plan  ASA: III  Anesthesia Plan: General   Post-op Pain Management:    Induction: Intravenous  PONV Risk Score and Plan: 3 and Ondansetron, Dexamethasone, Midazolam and Treatment may vary due to age or medical condition  Airway Management Planned: LMA  Additional Equipment:   Intra-op Plan:   Post-operative Plan: Extubation in OR  Informed Consent: I have reviewed the patients History and Physical, chart, labs and discussed the procedure including the risks, benefits and alternatives for the proposed anesthesia with the patient or authorized representative who has indicated his/her understanding and acceptance.     Dental advisory given  Plan Discussed with: CRNA  Anesthesia Plan Comments: (Follows with cardiology Dr. Acie Fredrickson for hx of HFrEF-EF of around 44% in 2010.  Her EF has now normalized. She was last seen 02/25/19. Discussed that she would be having  upcoming lumpectomy. No changes to management. Advised to f/u in 1 year.   TTE 2015: Study Conclusions   - Left ventricle: The cavity size was normal. Wall thickness  was normal. Systolic function was normal. The estimated  ejection fraction was in the range of 55% to 60%. Wall  motion was normal; there were no regional wall motion  abnormalities.  - Mitral valve: Mild, late systolicprolapse.  - Atrial septum: No defect or patent foramen ovale was  identified.  Transthoracic echocardiography. M-mode, complete 2D,  spectral Doppler, and color Doppler. Height: Height:  162.6cm. Height: 64in. Weight: Weight: 82.6kg. Weight:  181.6lb. Body mass index: BMI: 31.2kg/m^2. Body surface  area: BSA: 1.38m^2. Blood pressure: 170/98. Patient  status: Outpatient. Location: [facility]  )    Anesthesia Quick Evaluation

## 2019-03-17 ENCOUNTER — Other Ambulatory Visit: Payer: Self-pay

## 2019-03-17 ENCOUNTER — Ambulatory Visit
Admission: RE | Admit: 2019-03-17 | Discharge: 2019-03-17 | Disposition: A | Discharge status: Home / Self Care | Payer: PPO | Source: Ambulatory Visit | Attending: General Surgery | Admitting: General Surgery

## 2019-03-17 DIAGNOSIS — D0511 Intraductal carcinoma in situ of right breast: Secondary | ICD-10-CM

## 2019-03-18 ENCOUNTER — Ambulatory Visit (HOSPITAL_COMMUNITY): Payer: PPO | Admitting: Certified Registered"

## 2019-03-18 ENCOUNTER — Other Ambulatory Visit: Payer: Self-pay

## 2019-03-18 ENCOUNTER — Ambulatory Visit
Admission: RE | Admit: 2019-03-18 | Discharge: 2019-03-18 | Disposition: A | Discharge status: Home / Self Care | Payer: PPO | Source: Ambulatory Visit | Attending: General Surgery | Admitting: General Surgery

## 2019-03-18 ENCOUNTER — Encounter (HOSPITAL_COMMUNITY): Payer: Self-pay

## 2019-03-18 ENCOUNTER — Ambulatory Visit (HOSPITAL_COMMUNITY): Payer: PPO | Admitting: Physician Assistant

## 2019-03-18 ENCOUNTER — Encounter (HOSPITAL_COMMUNITY)
Admission: RE | Disposition: A | Discharge status: Home / Self Care | Payer: Self-pay | Source: Home / Self Care | Attending: General Surgery

## 2019-03-18 ENCOUNTER — Ambulatory Visit (HOSPITAL_COMMUNITY)
Admission: RE | Admit: 2019-03-18 | Discharge: 2019-03-18 | Disposition: A | Discharge status: Home / Self Care | Payer: PPO | Attending: General Surgery | Admitting: General Surgery

## 2019-03-18 DIAGNOSIS — Z6833 Body mass index (BMI) 33.0-33.9, adult: Secondary | ICD-10-CM | POA: Insufficient documentation

## 2019-03-18 DIAGNOSIS — Z79890 Hormone replacement therapy (postmenopausal): Secondary | ICD-10-CM | POA: Diagnosis not present

## 2019-03-18 DIAGNOSIS — I5022 Chronic systolic (congestive) heart failure: Secondary | ICD-10-CM | POA: Diagnosis not present

## 2019-03-18 DIAGNOSIS — Z87891 Personal history of nicotine dependence: Secondary | ICD-10-CM | POA: Diagnosis not present

## 2019-03-18 DIAGNOSIS — E669 Obesity, unspecified: Secondary | ICD-10-CM | POA: Diagnosis not present

## 2019-03-18 DIAGNOSIS — D0511 Intraductal carcinoma in situ of right breast: Secondary | ICD-10-CM | POA: Diagnosis not present

## 2019-03-18 DIAGNOSIS — Z79899 Other long term (current) drug therapy: Secondary | ICD-10-CM | POA: Diagnosis not present

## 2019-03-18 DIAGNOSIS — I509 Heart failure, unspecified: Secondary | ICD-10-CM | POA: Insufficient documentation

## 2019-03-18 DIAGNOSIS — E7800 Pure hypercholesterolemia, unspecified: Secondary | ICD-10-CM | POA: Insufficient documentation

## 2019-03-18 DIAGNOSIS — M1990 Unspecified osteoarthritis, unspecified site: Secondary | ICD-10-CM | POA: Insufficient documentation

## 2019-03-18 DIAGNOSIS — N6011 Diffuse cystic mastopathy of right breast: Secondary | ICD-10-CM | POA: Diagnosis not present

## 2019-03-18 DIAGNOSIS — I110 Hypertensive heart disease with heart failure: Secondary | ICD-10-CM | POA: Diagnosis not present

## 2019-03-18 DIAGNOSIS — E039 Hypothyroidism, unspecified: Secondary | ICD-10-CM | POA: Insufficient documentation

## 2019-03-18 DIAGNOSIS — N6489 Other specified disorders of breast: Secondary | ICD-10-CM | POA: Diagnosis not present

## 2019-03-18 DIAGNOSIS — G4730 Sleep apnea, unspecified: Secondary | ICD-10-CM | POA: Insufficient documentation

## 2019-03-18 DIAGNOSIS — Z170 Estrogen receptor positive status [ER+]: Secondary | ICD-10-CM | POA: Insufficient documentation

## 2019-03-18 DIAGNOSIS — N641 Fat necrosis of breast: Secondary | ICD-10-CM | POA: Diagnosis not present

## 2019-03-18 HISTORY — PX: BREAST LUMPECTOMY WITH RADIOACTIVE SEED LOCALIZATION: SHX6424

## 2019-03-18 SURGERY — BREAST LUMPECTOMY WITH RADIOACTIVE SEED LOCALIZATION
Anesthesia: General | Site: Breast | Laterality: Right

## 2019-03-18 MED ORDER — PROMETHAZINE HCL 25 MG/ML IJ SOLN
INTRAMUSCULAR | Status: AC
  Filled 2019-03-18: qty 1

## 2019-03-18 MED ORDER — BUPIVACAINE-EPINEPHRINE 0.25% -1:200000 IJ SOLN
INTRAMUSCULAR | Status: DC | PRN
  Administered 2019-03-18: 20 mL

## 2019-03-18 MED ORDER — LIDOCAINE 2% (20 MG/ML) 5 ML SYRINGE
INTRAMUSCULAR | Status: AC
  Filled 2019-03-18: qty 5

## 2019-03-18 MED ORDER — LACTATED RINGERS IV SOLN
INTRAVENOUS | Status: DC | PRN
  Administered 2019-03-18: 07:00:00 via INTRAVENOUS

## 2019-03-18 MED ORDER — FENTANYL CITRATE (PF) 250 MCG/5ML IJ SOLN
INTRAMUSCULAR | Status: AC
  Filled 2019-03-18: qty 5

## 2019-03-18 MED ORDER — HYDROMORPHONE HCL 1 MG/ML IJ SOLN: 0.2500 mg | INTRAMUSCULAR | Status: DC | PRN

## 2019-03-18 MED ORDER — CHLORHEXIDINE GLUCONATE CLOTH 2 % EX PADS: 6.0000 | MEDICATED_PAD | Freq: Once | CUTANEOUS | Status: DC

## 2019-03-18 MED ORDER — SODIUM CHLORIDE (PF) 0.9 % IJ SOLN
INTRAMUSCULAR | Status: AC
  Filled 2019-03-18: qty 10

## 2019-03-18 MED ORDER — CEFAZOLIN SODIUM-DEXTROSE 2-3 GM-%(50ML) IV SOLR
INTRAVENOUS | Status: DC | PRN
  Administered 2019-03-18: 2 g via INTRAVENOUS

## 2019-03-18 MED ORDER — EPHEDRINE 5 MG/ML INJ
INTRAVENOUS | Status: AC
  Filled 2019-03-18: qty 10

## 2019-03-18 MED ORDER — PROMETHAZINE HCL 25 MG/ML IJ SOLN
6.2500 mg | INTRAMUSCULAR | Status: AC | PRN
  Administered 2019-03-18: 12.5 mg via INTRAVENOUS
  Administered 2019-03-18: 6.25 mg via INTRAVENOUS

## 2019-03-18 MED ORDER — GLYCOPYRROLATE PF 0.2 MG/ML IJ SOSY
PREFILLED_SYRINGE | INTRAMUSCULAR | Status: AC
  Filled 2019-03-18: qty 2

## 2019-03-18 MED ORDER — GLYCOPYRROLATE PF 0.2 MG/ML IJ SOSY
PREFILLED_SYRINGE | INTRAMUSCULAR | Status: DC | PRN
  Administered 2019-03-18 (×2): .2 mg via INTRAVENOUS

## 2019-03-18 MED ORDER — SUCCINYLCHOLINE CHLORIDE 200 MG/10ML IV SOSY
PREFILLED_SYRINGE | INTRAVENOUS | Status: AC
  Filled 2019-03-18: qty 10

## 2019-03-18 MED ORDER — GABAPENTIN 300 MG PO CAPS
ORAL_CAPSULE | ORAL | Status: AC
  Administered 2019-03-18: 300 mg via ORAL
  Filled 2019-03-18: qty 1

## 2019-03-18 MED ORDER — DEXAMETHASONE SODIUM PHOSPHATE 10 MG/ML IJ SOLN
INTRAMUSCULAR | Status: AC
  Filled 2019-03-18: qty 1

## 2019-03-18 MED ORDER — ACETAMINOPHEN 500 MG PO TABS
1000.0000 mg | ORAL_TABLET | ORAL | Status: AC
  Administered 2019-03-18: 07:00:00 1000 mg via ORAL

## 2019-03-18 MED ORDER — FENTANYL CITRATE (PF) 100 MCG/2ML IJ SOLN
INTRAMUSCULAR | Status: DC | PRN
  Administered 2019-03-18 (×3): 50 ug via INTRAVENOUS

## 2019-03-18 MED ORDER — EPHEDRINE SULFATE 50 MG/ML IJ SOLN
INTRAMUSCULAR | Status: DC | PRN
  Administered 2019-03-18: 5 mg via INTRAVENOUS
  Administered 2019-03-18: 10 mg via INTRAVENOUS
  Administered 2019-03-18: 5 mg via INTRAVENOUS

## 2019-03-18 MED ORDER — HYDROCODONE-ACETAMINOPHEN 5-325 MG PO TABS: 1.0000 | ORAL_TABLET | Freq: Four times a day (QID) | ORAL | 0 refills | Status: DC | PRN

## 2019-03-18 MED ORDER — MIDAZOLAM HCL 2 MG/2ML IJ SOLN
INTRAMUSCULAR | Status: AC
  Filled 2019-03-18: qty 2

## 2019-03-18 MED ORDER — DEXAMETHASONE SODIUM PHOSPHATE 4 MG/ML IJ SOLN
INTRAMUSCULAR | Status: DC | PRN
  Administered 2019-03-18: 10 mg via INTRAVENOUS

## 2019-03-18 MED ORDER — ACETAMINOPHEN 500 MG PO TABS
ORAL_TABLET | ORAL | Status: AC
  Administered 2019-03-18: 1000 mg via ORAL
  Filled 2019-03-18: qty 2

## 2019-03-18 MED ORDER — CEFAZOLIN SODIUM-DEXTROSE 2-4 GM/100ML-% IV SOLN: 2.0000 g | INTRAVENOUS | Status: DC

## 2019-03-18 MED ORDER — LACTATED RINGERS IV SOLN
INTRAVENOUS | Status: DC
  Administered 2019-03-18: 07:00:00 via INTRAVENOUS

## 2019-03-18 MED ORDER — OXYCODONE HCL 5 MG/5ML PO SOLN: 5.0000 mg | Freq: Once | ORAL | Status: DC | PRN

## 2019-03-18 MED ORDER — PROPOFOL 10 MG/ML IV BOLUS
INTRAVENOUS | Status: DC | PRN
  Administered 2019-03-18: 140 mg via INTRAVENOUS

## 2019-03-18 MED ORDER — ARTIFICIAL TEARS OPHTHALMIC OINT
TOPICAL_OINTMENT | OPHTHALMIC | Status: AC
  Filled 2019-03-18: qty 3.5

## 2019-03-18 MED ORDER — LIDOCAINE 2% (20 MG/ML) 5 ML SYRINGE
INTRAMUSCULAR | Status: DC | PRN
  Administered 2019-03-18: 60 mg via INTRAVENOUS

## 2019-03-18 MED ORDER — CELECOXIB 200 MG PO CAPS
200.0000 mg | ORAL_CAPSULE | ORAL | Status: AC
  Administered 2019-03-18: 07:00:00 200 mg via ORAL

## 2019-03-18 MED ORDER — 0.9 % SODIUM CHLORIDE (POUR BTL) OPTIME
TOPICAL | Status: DC | PRN
  Administered 2019-03-18: 09:00:00 1000 mL

## 2019-03-18 MED ORDER — ONDANSETRON HCL 4 MG/2ML IJ SOLN
INTRAMUSCULAR | Status: DC | PRN
  Administered 2019-03-18: 4 mg via INTRAVENOUS

## 2019-03-18 MED ORDER — MIDAZOLAM HCL 5 MG/5ML IJ SOLN
INTRAMUSCULAR | Status: DC | PRN
  Administered 2019-03-18: 2 mg via INTRAVENOUS

## 2019-03-18 MED ORDER — OXYCODONE HCL 5 MG PO TABS: 5.0000 mg | ORAL_TABLET | Freq: Once | ORAL | Status: DC | PRN

## 2019-03-18 MED ORDER — CELECOXIB 200 MG PO CAPS
ORAL_CAPSULE | ORAL | Status: AC
  Administered 2019-03-18: 200 mg via ORAL
  Filled 2019-03-18: qty 1

## 2019-03-18 MED ORDER — PROPOFOL 10 MG/ML IV BOLUS
INTRAVENOUS | Status: AC
  Filled 2019-03-18: qty 20

## 2019-03-18 MED ORDER — GABAPENTIN 300 MG PO CAPS
300.0000 mg | ORAL_CAPSULE | ORAL | Status: AC
  Administered 2019-03-18: 07:00:00 300 mg via ORAL

## 2019-03-18 MED ORDER — ONDANSETRON HCL 4 MG/2ML IJ SOLN
INTRAMUSCULAR | Status: AC
  Filled 2019-03-18: qty 2

## 2019-03-18 MED ORDER — CEFAZOLIN SODIUM-DEXTROSE 2-4 GM/100ML-% IV SOLN
INTRAVENOUS | Status: AC
  Filled 2019-03-18: qty 100

## 2019-03-18 MED ORDER — BUPIVACAINE-EPINEPHRINE (PF) 0.25% -1:200000 IJ SOLN
INTRAMUSCULAR | Status: AC
  Filled 2019-03-18: qty 20

## 2019-03-18 SURGICAL SUPPLY — 35 items
ADH SKN CLS APL DERMABOND .7 (GAUZE/BANDAGES/DRESSINGS) ×1
APL PRP STRL LF DISP 70% ISPRP (MISCELLANEOUS) ×1
APPLIER CLIP 9.375 MED OPEN (MISCELLANEOUS)
APR CLP MED 9.3 20 MLT OPN (MISCELLANEOUS)
BINDER BREAST LRG (GAUZE/BANDAGES/DRESSINGS) IMPLANT
BINDER BREAST XLRG (GAUZE/BANDAGES/DRESSINGS) IMPLANT
CANISTER SUCT 3000ML PPV (MISCELLANEOUS) ×2 IMPLANT
CHLORAPREP W/TINT 26 (MISCELLANEOUS) ×2 IMPLANT
CLIP APPLIE 9.375 MED OPEN (MISCELLANEOUS) IMPLANT
COVER PROBE W GEL 5X96 (DRAPES) ×2 IMPLANT
COVER SURGICAL LIGHT HANDLE (MISCELLANEOUS) ×2 IMPLANT
COVER WAND RF STERILE (DRAPES) IMPLANT
DERMABOND ADVANCED (GAUZE/BANDAGES/DRESSINGS) ×1
DERMABOND ADVANCED .7 DNX12 (GAUZE/BANDAGES/DRESSINGS) ×1 IMPLANT
DEVICE DUBIN SPECIMEN MAMMOGRA (MISCELLANEOUS) ×2 IMPLANT
DRAPE CHEST BREAST 15X10 FENES (DRAPES) ×2 IMPLANT
ELECT COATED BLADE 2.86 ST (ELECTRODE) ×2 IMPLANT
ELECT REM PT RETURN 9FT ADLT (ELECTROSURGICAL) ×2
ELECTRODE REM PT RTRN 9FT ADLT (ELECTROSURGICAL) ×1 IMPLANT
GLOVE BIO SURGEON STRL SZ7.5 (GLOVE) ×5 IMPLANT
GOWN STRL REUS W/ TWL LRG LVL3 (GOWN DISPOSABLE) ×2 IMPLANT
GOWN STRL REUS W/TWL LRG LVL3 (GOWN DISPOSABLE) ×4
KIT BASIN OR (CUSTOM PROCEDURE TRAY) ×2 IMPLANT
KIT MARKER MARGIN INK (KITS) ×2 IMPLANT
LIGHT WAVEGUIDE WIDE FLAT (MISCELLANEOUS) IMPLANT
NDL HYPO 25GX1X1/2 BEV (NEEDLE) ×1 IMPLANT
NEEDLE HYPO 25GX1X1/2 BEV (NEEDLE) ×2 IMPLANT
NS IRRIG 1000ML POUR BTL (IV SOLUTION) ×2 IMPLANT
PACK GENERAL/GYN (CUSTOM PROCEDURE TRAY) ×2 IMPLANT
SUT MNCRL AB 4-0 PS2 18 (SUTURE) ×2 IMPLANT
SUT SILK 2 0 SH (SUTURE) IMPLANT
SUT VIC AB 3-0 SH 18 (SUTURE) ×2 IMPLANT
SYR CONTROL 10ML LL (SYRINGE) ×2 IMPLANT
TOWEL GREEN STERILE (TOWEL DISPOSABLE) ×2 IMPLANT
TOWEL GREEN STERILE FF (TOWEL DISPOSABLE) ×2 IMPLANT

## 2019-03-18 NOTE — H&P (Signed)
Adrienne [name]  Location: [facility] Surgery Patient #: [id number] DOB: 1946-02-27 Widowed / Language: [name] / Race: Black or African American Female   History of Present Illness The patient is a 73 year old female who presents with breast cancer.We are asked to see the patient in consultation by Dr. Isidore Moos to evaluate her for a new right breast cancer. The patient is a 73 year old black female who presents with a 55mm area of calcs on screening mammogram in the retroareolar area. This was biopsied and came back as intermediate grade dcis that was ER and PR +. She quit smoking 3 years ago. Her only family history is of leukemia in her sister.   Past Surgical History Breast Biopsy  Right. Gallbladder Surgery - Open  Hip Surgery  Bilateral. Hysterectomy (not due to cancer) - Complete  Knee Surgery  Right. Oral Surgery  Spinal Surgery - Lower Back   Diagnostic Studies History  Colonoscopy  within last year Mammogram  within last year Pap Smear  >5 years ago  Medication History  Medications Reconciled  Social History No alcohol use  No caffeine use  No drug use  Tobacco use  Former smoker.  Family History  Cancer  Sister. Cerebrovascular Accident  Sister. Diabetes Mellitus  Brother. Hypertension  Brother, Sister.  Pregnancy / Birth History Age at menarche  61 years. Age of menopause  <47 Gravida  4 Irregular periods  Maternal age  37-20 Para  3  Other Problems  Arthritis  Cholelithiasis  High blood pressure  Hypercholesterolemia  Thyroid Disease  Transfusion history     Review of Systems General Not Present- Appetite Loss, Chills, Fatigue, Fever, Night Sweats, Weight Gain and Weight Loss. Skin Not Present- Change in Wart/Mole, Dryness, Hives, Jaundice, New Lesions, Non-Healing Wounds, Rash and Ulcer. HEENT Not Present- Earache, Hearing Loss, Hoarseness, Nose Bleed, Oral Ulcers, Ringing in the Ears, Seasonal Allergies,  Sinus Pain, Sore Throat, Visual Disturbances, Wears glasses/contact lenses and Yellow Eyes. Respiratory Not Present- Bloody sputum, Chronic Cough, Difficulty Breathing, Snoring and Wheezing. Breast Not Present- Breast Mass, Breast Pain, Nipple Discharge and Skin Changes. Cardiovascular Not Present- Chest Pain, Difficulty Breathing Lying Down, Leg Cramps, Palpitations, Rapid Heart Rate, Shortness of Breath and Swelling of Extremities. Gastrointestinal Not Present- Abdominal Pain, Bloating, Bloody Stool, Change in Bowel Habits, Chronic diarrhea, Constipation, Difficulty Swallowing, Excessive gas, Gets full quickly at meals, Hemorrhoids, Indigestion, Nausea, Rectal Pain and Vomiting. Female Genitourinary Not Present- Frequency, Nocturia, Painful Urination, Pelvic Pain and Urgency. Musculoskeletal Not Present- Back Pain, Joint Pain, Joint Stiffness, Muscle Pain, Muscle Weakness and Swelling of Extremities. Neurological Not Present- Decreased Memory, Fainting, Headaches, Numbness, Seizures, Tingling, Tremor, Trouble walking and Weakness. Psychiatric Not Present- Anxiety, Bipolar, Change in Sleep Pattern, Depression, Fearful and Frequent crying. Endocrine Not Present- Cold Intolerance, Excessive Hunger, Hair Changes, Heat Intolerance, Hot flashes and New Diabetes. Hematology Not Present- Blood Thinners, Easy Bruising, Excessive bleeding, Gland problems, HIV and Persistent Infections.   Physical Exam General Mental Status-Alert. General Appearance-Consistent with stated age. Hydration-Well hydrated. Voice-Normal.  Head and Neck Head-normocephalic, atraumatic with no lesions or palpable masses. Trachea-midline. Thyroid Gland Characteristics - normal size and consistency.  Eye Eyeball - Bilateral-Extraocular movements intact. Sclera/Conjunctiva - Bilateral-No scleral icterus.  Chest and Lung Exam Chest and lung exam reveals -quiet, even and easy respiratory effort with no use  of accessory muscles and on auscultation, normal breath sounds, no adventitious sounds and normal vocal resonance. Inspection Chest Wall - Normal. Back - normal.  Breast  Note: There is no palpable mass in either breast. There is no palpable axillary, supraclavicular, or cervical lymphadenopathy   Cardiovascular Cardiovascular examination reveals -normal heart sounds, regular rate and rhythm with no murmurs and normal pedal pulses bilaterally.  Abdomen Inspection Inspection of the abdomen reveals - No Hernias. Skin - Scar - no surgical scars. Palpation/Percussion Palpation and Percussion of the abdomen reveal - Soft, Non Tender, No Rebound tenderness, No Rigidity (guarding) and No hepatosplenomegaly. Auscultation Auscultation of the abdomen reveals - Bowel sounds normal.  Neurologic Neurologic evaluation reveals -alert and oriented x 3 with no impairment of recent or remote memory. Mental Status-Normal.  Musculoskeletal Normal Exam - Left-Upper Extremity Strength Normal and Lower Extremity Strength Normal. Normal Exam - Right-Upper Extremity Strength Normal and Lower Extremity Strength Normal.  Lymphatic Head & Neck  General Head & Neck Lymphatics: Bilateral - Description - Normal. Axillary  General Axillary Region: Bilateral - Description - Normal. Tenderness - Non Tender. Femoral & Inguinal  Generalized Femoral & Inguinal Lymphatics: Bilateral - Description - Normal. Tenderness - Non Tender.    Assessment & Plan   DUCTAL CARCINOMA IN SITU (DCIS) OF RIGHT BREAST (D05.11) Impression: The patient appears to have a small area of dcis in the central right breast. I have talked to her in detail about the different options for surgery. She is also a candidate for the Comet trial. I have discussed with her in detail the risks and benefits of the surgery as well as some of the technical aspects and she understands. She will talk to her family and decide which treatment  she would like and then let us know  Current Plans Referred to Oncology, for evaluation and follow up (Oncology). Routine. Follow up with [place] in the office in 3 MONTHS.  Call us sooner as needed.

## 2019-03-18 NOTE — Op Note (Signed)
03/18/2019  9:44 AM  PATIENT:  Adrienne [name]  73 y.o. female  PRE-OPERATIVE DIAGNOSIS:  RIGHT BREAST DCIS  POST-OPERATIVE DIAGNOSIS:  RIGHT BREAST DCIS  PROCEDURE:  Procedure(s): RIGHT BREAST LUMPECTOMY WITH RADIOACTIVE SEED LOCALIZATION (Right)  SURGEON:  Surgeon(s) and Role:    * Jovita Kussmaul, MD - Primary  PHYSICIAN ASSISTANT:   ASSISTANTS: none   ANESTHESIA:   local and general  EBL:  25 mL   BLOOD ADMINISTERED:none  DRAINS: none   LOCAL MEDICATIONS USED:  MARCAINE     SPECIMEN:  Source of Specimen:  right breast tissue with additional margins  DISPOSITION OF SPECIMEN:  PATHOLOGY  COUNTS:  YES  TOURNIQUET:  * No tourniquets in log *  DICTATION: .Dragon Dictation   After informed consent was obtained the patient was brought to the operating room and placed in the supine position on the operating table.  After adequate induction of general anesthesia the patient's right breast was prepped with ChloraPrep, allowed to dry, and draped in usual sterile manner.  An appropriate timeout was performed.  Previously an I-125 seed was placed in the lower central right breast to mark an area of ductal carcinoma in situ.  The neoprobe was set to I-125 in the area of radioactivity was readily identified.  The area around this was infiltrated with quarter percent Marcaine.  A curvilinear incision was made with a 15 blade knife along the inferior edge of the areola of the right breast.  The incision was carried through the skin and subcutaneous tissue sharply with the electrocautery.  Dissection was then carried out towards the radioactive seed under the direction of the neoprobe.  Once I more closely approached the radioactive seed I then removed a circular portion of breast tissue sharply with the electrocautery around the radioactive seed while checking the area of radioactivity frequently.  Once the specimen was removed it was oriented with the appropriate paint colors.  A  specimen radiograph was obtained that showed the clip and seed to be near the center of the specimen.  I did decide to take some additional margins and these were marked appropriately and sent separately.  Hemostasis was achieved using the Bovie electrocautery.  The wound was then irrigated with saline and infiltrated with more quarter percent Marcaine.  The cavity was marked with clips.  The deep layer of the wound was then closed with layers of interrupted 3-0 Vicryl stitches.  The skin was then closed with interrupted 4-0 Monocryl subcuticular stitches.  Dermabond dressings were applied.  The patient tolerated the procedure well.  At the end of the case all needle sponge and instrument counts were correct.  The patient was then awakened and taken recovery in stable condition.  PLAN OF CARE: Discharge to home after PACU  PATIENT DISPOSITION:  PACU - hemodynamically stable.   Delay start of Pharmacological VTE agent (>24hrs) due to surgical blood loss or risk of bleeding: not applicable

## 2019-03-18 NOTE — Transfer of Care (Signed)
Immediate Anesthesia Transfer of Care Note  Patient: Adrienne [name]  Procedure(s) Performed: RIGHT BREAST LUMPECTOMY WITH RADIOACTIVE SEED LOCALIZATION (Right Breast)  Patient Location: PACU  Anesthesia Type:General  Level of Consciousness: drowsy  Airway & Oxygen Therapy: Patient Spontanous Breathing and Patient connected to face mask oxygen  Post-op Assessment: Report given to RN and Post -op Vital signs reviewed and stable  Post vital signs: Reviewed and stable  Last Vitals:  Vitals Value Taken Time  BP 128/77 03/18/19 0954  Temp    Pulse 65 03/18/19 0955  Resp 14 03/18/19 0955  SpO2 100 % 03/18/19 0955  Vitals shown include unvalidated device data.  Last Pain:  Vitals:   03/18/19 0955  TempSrc:   PainSc: (P) 0-No pain         Complications: No apparent anesthesia complications

## 2019-03-18 NOTE — Anesthesia Procedure Notes (Signed)
Procedure Name: LMA Insertion Date/Time: 03/18/2019 9:09 AM Performed by: Clovis Cao, CRNA Pre-anesthesia Checklist: Patient identified, Emergency Drugs available, Patient being monitored, Suction available and Timeout performed Patient Re-evaluated:Patient Re-evaluated prior to induction Oxygen Delivery Method: Circle system utilized Preoxygenation: Pre-oxygenation with 100% oxygen Induction Type: IV induction Ventilation: Mask ventilation without difficulty LMA: LMA inserted LMA Size: 4.0 Placement Confirmation: positive ETCO2 and breath sounds checked- equal and bilateral Tube secured with: Tape Dental Injury: Teeth and Oropharynx as per pre-operative assessment

## 2019-03-18 NOTE — Anesthesia Postprocedure Evaluation (Signed)
Anesthesia Post Note  Patient: Adrienne [name]  Procedure(s) Performed: RIGHT BREAST LUMPECTOMY WITH RADIOACTIVE SEED LOCALIZATION (Right Breast)     Patient location during evaluation: PACU Anesthesia Type: General Level of consciousness: awake and alert Pain management: pain level controlled Vital Signs Assessment: post-procedure vital signs reviewed and stable Respiratory status: spontaneous breathing, nonlabored ventilation and respiratory function stable Cardiovascular status: blood pressure returned to baseline and stable Postop Assessment: no apparent nausea or vomiting Anesthetic complications: no    Last Vitals:  Vitals:   03/18/19 1115 03/18/19 1130  BP: 138/73 120/73  Pulse: (!) 56 (!) 57  Resp: 16 16  Temp:    SpO2: 96% 99%    Last Pain:  Vitals:   03/18/19 1100  TempSrc:   PainSc: 0-No pain                 Lynda Rainwater

## 2019-03-18 NOTE — Interval H&P Note (Signed)
History and Physical Interval Note:  03/18/2019 8:41 AM  Adrienne [name]  has presented today for surgery, with the diagnosis of RIGHT BREAST DCIS.  The various methods of treatment have been discussed with the patient and family. After consideration of risks, benefits and other options for treatment, the patient has consented to  Procedure(s): RIGHT BREAST LUMPECTOMY WITH RADIOACTIVE SEED LOCALIZATION (Right) as a surgical intervention.  The patient's history has been reviewed, patient examined, no change in status, stable for surgery.  I have reviewed the patient's chart and labs.  Questions were answered to the patient's satisfaction.     [name]

## 2019-03-19 ENCOUNTER — Encounter (HOSPITAL_COMMUNITY): Payer: Self-pay | Admitting: General Surgery

## 2019-03-19 ENCOUNTER — Encounter: Payer: Self-pay | Admitting: *Deleted

## 2019-03-22 ENCOUNTER — Ambulatory Visit: Payer: Self-pay | Admitting: General Surgery

## 2019-03-31 NOTE — Progress Notes (Signed)
[facility] Psychosocial Distress Screening Clinical Social Work  Clinical Social Work was referred by distress screening protocol.  The patient scored a 6 on the Psychosocial Distress Thermometer which indicates moderate distress. Clinical Social Worker contacted patient by phone to assess for distress and other psychosocial needs.   ONCBCN DISTRESS SCREENING 03/31/2019  Distress experienced in past week (1-10) 6  Practical problem type Childcare  Physician notified of physical symptoms No  Referral to clinical psychology No  Referral to clinical social work No  Referral to dietition No  Referral to financial advocate No  Referral to support programs Yes  Referral to palliative care No    Clinical Social Worker follow up needed: No.  Counseling Intern Notes: Spoke to the patient by phone on 03/31/19.  She stated she is doing "great" and reported having a robust support system via her large family.  Patient stated that she has surgery tomorrow but did not report any anxiety about the procedure. The patient declined counseling services at this time, but I will check-in with the patient in 4 weeks to offer emotional and mental health support at that time.    Art Buff [place] Counseling Intern Voicemail:  [phone]

## 2019-04-02 ENCOUNTER — Ambulatory Visit (INDEPENDENT_AMBULATORY_CARE_PROVIDER_SITE_OTHER): Payer: PPO | Admitting: Family Medicine

## 2019-04-02 ENCOUNTER — Other Ambulatory Visit: Payer: Self-pay

## 2019-04-02 ENCOUNTER — Encounter: Payer: Self-pay | Admitting: Family Medicine

## 2019-04-02 VITALS — BP 148/82 | HR 58 | Temp 97.4°F | Resp 16 | Ht 63.0 in | Wt 195.0 lb

## 2019-04-02 DIAGNOSIS — M109 Gout, unspecified: Secondary | ICD-10-CM

## 2019-04-02 MED ORDER — PREDNISONE 20 MG PO TABS: ORAL_TABLET | ORAL | 0 refills | Status: DC

## 2019-04-02 NOTE — Progress Notes (Signed)
Subjective:    Patient ID: Adrienne [name], female    DOB: [birth year], 73 y.o.   MRN: [id number]  HPI Patient is a very pleasant 73 year old African-American female.  Since I last saw her, she has been diagnosed with ductal carcinoma in situ of the breast and is scheduled for reexcision later this month.  She presents today with a swollen left first MTP joint.  The joint is swollen and slightly edematous.  It is slightly warm to the touch.  It is painful to the touch.  Patient states that approximately 5 to 6 weeks ago this started.  She went to an urgent care where they did x-rays and told her that she had arthritis.  She was started on naproxen which did not help.  She has a family history of gout in her brother and and her niece although she has never been diagnosed with gout.  She has surgery scheduled next week for the reexcision so they have recommended that she avoid all NSAIDs. Past Medical History:  Diagnosis Date  . Ambulates with cane    straight cane  . Arthritis    knee, back  . CHF (congestive heart failure) (HCC)    EF 55-60%  pt. denies. Dr. did further test and could not confirm per pt.  . Ductal carcinoma in situ of breast   . Dysrhythmia    hx of - no current problems  . Headache    hx of migraines - no longer a problem  . History of blood transfusion 1986   w/ Hysterectomy surgery  . Hyperlipidemia    diet controlled - no meds  . Hypertension   . Hypothyroidism   . Knee pain   . Pelvic kidney    lower right pelvic kidney   . Pre-diabetes   . Prediabetes    diet controlled - no meds  . SBO (small bowel obstruction) ([place]) 10/2016   surgery   . SBO (small bowel obstruction) ([place]) 10/2016  . Shoulder pain   . Sleep apnea    Mild - no mask needed per sleep study  . Smoker    quit smoking 2018  . Thyroid disease   . Tinnitus    BOTH EARS   Past Surgical History:  Procedure Laterality Date  . ABDOMINAL HYSTERECTOMY  1986   COMPLETE  . APPENDECTOMY      pt states it was removed when gallbladder was removed.  Marland Kitchen BACK SURGERY     lower back  . BREAST LUMPECTOMY WITH RADIOACTIVE SEED LOCALIZATION Right 03/18/2019   Procedure: RIGHT BREAST LUMPECTOMY WITH RADIOACTIVE SEED LOCALIZATION;  Surgeon: Jovita Kussmaul, MD;  Location: [place];  Service: General;  Laterality: Right;  . CHOLECYSTECTOMY    . COLONOSCOPY  02/15/2008   [name]   . ECTOPIC PREGNANCY SURGERY    . JOINT REPLACEMENT     Left hip total Dr. [name] 10-01-17  . polypectomy-oropharynx    . right knee meniscus    . SBO  10/2016   small bowel obstruction  . TOTAL HIP ARTHROPLASTY Left 10/01/2017   Procedure: LEFT  TOTAL HIP ARTHROPLASTY ANTERIOR APPROACH;  Surgeon: [name], MD;  [facility];  Service: Orthopedics;  Laterality: Left;  . TOTAL HIP ARTHROPLASTY Right 03/11/2018   Procedure: RIGHT TOTAL HIP ARTHROPLASTY ANTERIOR APPROACH;  Surgeon: [name], MD;  Location[facility];  Service: Orthopedics;  Laterality: Right;  176min  . UPPER GASTROINTESTINAL ENDOSCOPY     Current Outpatient Medications on File  Prior to Visit  Medication Sig Dispense Refill  . amLODipine (NORVASC) 10 MG tablet Take 1 tablet (10 mg total) by mouth daily. 90 tablet 2  . carvedilol (COREG) 3.125 MG tablet Take 1 tablet (3.125 mg total) by mouth 2 (two) times daily with a meal. 60 tablet 5  . hydrochlorothiazide (HYDRODIURIL) 25 MG tablet Take 1 tablet by mouth once daily (Patient taking differently: Take 25 mg by mouth daily. ) 90 tablet 0  . HYDROcodone-acetaminophen (NORCO/VICODIN) 5-325 MG tablet Take 1-2 tablets by mouth every 6 (six) hours as needed for moderate pain or severe pain. 10 tablet 0  . hydroxypropyl methylcellulose / hypromellose (ISOPTO TEARS / GONIOVISC) 2.5 % ophthalmic solution Place 1 drop into both eyes daily as needed for dry eyes.    Marland Kitchen levothyroxine (SYNTHROID, LEVOTHROID) 75 MCG tablet Take 1 tablet (75 mcg total) by mouth daily. 90 tablet 1   No current  facility-administered medications on file prior to visit.    Allergies  Allergen Reactions  . Lisinopril Swelling    Angioedema  . Atorvastatin Other (See Comments)    Muscle aches     Social History   Socioeconomic History  . Marital status: Widowed    Spouse name: Not on file  . Number of children: Not on file  . Years of education: Not on file  . Highest education level: Not on file  Occupational History  . Occupation: retired  Scientific laboratory technician  . Financial resource strain: Not on file  . Food insecurity    Worry: Not on file    Inability: Not on file  . Transportation needs    Medical: Not on file    Non-medical: Not on file  Tobacco Use  . Smoking status: Former Smoker    Packs/day: 1.00    Years: 32.00    Pack years: 32.00    Types: E-cigarettes, Cigarettes    Quit date: 11/20/2016    Years since quitting: 2.3  . Smokeless tobacco: Never Used  . Tobacco comment: Smoker since 1975 has quit and started back several times!!,  Substance and Sexual Activity  . Alcohol use: No  . Drug use: No  . Sexual activity: Not Currently    Birth control/protection: Surgical    Comment: Hysterectomy  Lifestyle  . Physical activity    Days per week: Not on file    Minutes per session: Not on file  . Stress: Not on file  Relationships  . Social Herbalist on phone: Not on file    Gets together: Not on file    Attends religious service: Not on file    Active member of club or organization: Not on file    Attends meetings of clubs or organizations: Not on file    Relationship status: Not on file  . Intimate partner violence    Fear of current or ex partner: Not on file    Emotionally abused: Not on file    Physically abused: Not on file    Forced sexual activity: Not on file  Other Topics Concern  . Not on file  Social History Narrative  . Not on file      Review of Systems  All other systems reviewed and are negative.      Objective:   Physical Exam  Vitals signs reviewed.  Constitutional:      General: She is not in acute distress.    Appearance: She is well-developed. She is not diaphoretic.  Neck:  Musculoskeletal: Normal range of motion.     Thyroid: No thyromegaly.  Cardiovascular:     Rate and Rhythm: Normal rate and regular rhythm.     Heart sounds: Normal heart sounds.  Pulmonary:     Effort: Pulmonary effort is normal.     Breath sounds: Normal breath sounds.  Musculoskeletal:     Left foot: Tenderness, bony tenderness and swelling present.       Feet:  Neurological:     Mental Status: She is alert.           Assessment & Plan:  Podagra - Plan: predniSONE (DELTASONE) 20 MG tablet, Uric acid  I believe the patient has podagra.  I recommended a prednisone taper pack given her upcoming surgery and her need to avoid NSAIDs.  I will check a uric acid level.  Reassess next week for improvement.  Patient has already had imaging performed in urgent care and per her report this was normal

## 2019-04-03 LAB — URIC ACID: Uric Acid, Serum: 8.7 mg/dL — ABNORMAL HIGH (ref 2.5–7.0)

## 2019-04-05 ENCOUNTER — Other Ambulatory Visit (HOSPITAL_COMMUNITY)
Admission: RE | Admit: 2019-04-05 | Discharge: 2019-04-05 | Disposition: A | Discharge status: Home / Self Care | Payer: PPO | Source: Ambulatory Visit | Attending: General Surgery | Admitting: General Surgery

## 2019-04-05 ENCOUNTER — Other Ambulatory Visit: Payer: Self-pay

## 2019-04-05 DIAGNOSIS — Z01812 Encounter for preprocedural laboratory examination: Secondary | ICD-10-CM | POA: Insufficient documentation

## 2019-04-05 DIAGNOSIS — Z20828 Contact with and (suspected) exposure to other viral communicable diseases: Secondary | ICD-10-CM | POA: Diagnosis not present

## 2019-04-05 DIAGNOSIS — M109 Gout, unspecified: Secondary | ICD-10-CM

## 2019-04-05 LAB — SARS CORONAVIRUS 2 (TAT 6-24 HRS): SARS Coronavirus 2: NEGATIVE

## 2019-04-05 MED ORDER — PREDNISONE 20 MG PO TABS: ORAL_TABLET | ORAL | 0 refills | Status: DC

## 2019-04-05 NOTE — Progress Notes (Signed)
PCP - Jenna Luo Cardiologist - Mertie Moores lov 02-25-19 epic  Chest x-ray -  EKG - 02-25-19 epic Stress Test -  ECHO - 2015 epic Cardiac Cath -   Sleep Study -  CPAP -   Fasting Blood Sugar -  Checks Blood Sugar _____ times a day  Blood Thinner Instructions: Aspirin Instructions: Last Dose:  Anesthesia review: CHF  Patient denies shortness of breath, fever, cough and chest pain at PAT appointment   Patient verbalized understanding of instructions that were given to them at the PAT appointment. Patient was also instructed that they will need to review over the PAT instructions again at home before surgery.

## 2019-04-05 NOTE — Patient Instructions (Signed)
DUE TO COVID-19 ONLY ONE VISITOR IS ALLOWED TO COME WITH YOU AND STAY IN THE WAITING ROOM ONLY DURING PRE OP AND PROCEDURE DAY OF SURGERY. THE 1 VISITOR MAY VISIT WITH YOU AFTER SURGERY IN YOUR PRIVATE ROOM DURING VISITING HOURS ONLY!  YOU NEED TO HAVE A COVID 19 TEST ON_______ @_______ , THIS TEST MUST BE DONE BEFORE SURGERY, COME  [address] , [phone].  ([place]) ONCE YOUR COVID TEST IS COMPLETED, PLEASE BEGIN THE QUARANTINE INSTRUCTIONS AS OUTLINED IN YOUR HANDOUT.                Markeshia Manbeck Selke  04/05/2019   Your procedure is scheduled on: 04-08-19   Report to [facility] Main  Entrance   Report to admitting at      [place] AM     Call this number if you have problems the morning of surgery [phone]    Remember: Do not eat food or drink liquids :After Midnight.  BRUSH YOUR TEETH MORNING OF SURGERY AND RINSE YOUR MOUTH OUT, NO CHEWING GUM CANDY OR MINTS.     Take these medicines the morning of surgery with A SIP OF WATER: LEVOTHYROXINE, CARVEDILOL, AMLODIPINE                                 You may not have any metal on your body including hair pins and              piercings  Do not wear jewelry, make-up, lotions, powders or perfumes, deodorant             Do not wear nail polish on your fingernails.  Do not shave  48 hours prior to surgery.     Do not bring valuables to the hospital. [place].  Contacts, dentures or bridgework may not be worn into surgery.      Patients discharged the day of surgery will not be allowed to drive home. IF YOU ARE HAVING SURGERY AND GOING HOME THE SAME DAY, YOU MUST HAVE AN ADULT TO DRIVE YOU HOME AND BE WITH YOU FOR 24 HOURS. YOU MAY GO HOME BY TAXI OR UBER OR ORTHERWISE, BUT AN ADULT MUST ACCOMPANY YOU HOME AND STAY WITH YOU FOR 24 HOURS.  Name and phone number of your driver:  Special Instructions: N/A              Please read over the  following fact sheets you were given: _____________________________________________________________________             [facility] - Preparing for Surgery Before surgery, you can play an important role.  Because skin is not sterile, your skin needs to be as free of germs as possible.  You can reduce the number of germs on your skin by washing with CHG (chlorahexidine gluconate) soap before surgery.  CHG is an antiseptic cleaner which kills germs and bonds with the skin to continue killing germs even after washing. Please DO NOT use if you have an allergy to CHG or antibacterial soaps.  If your skin becomes reddened/irritated stop using the CHG and inform your nurse when you arrive at Short Stay. Do not shave (including legs and underarms) for at least 48 hours prior to the first CHG shower.  You may shave your face/neck.  Please follow these instructions carefully:  1.  Shower with CHG Soap the night before surgery and the  morning of Surgery.  2.  If you choose to wash your hair, wash your hair first as usual with your  normal  shampoo.  3.  After you shampoo, rinse your hair and body thoroughly to remove the  shampoo.                           4.  Use CHG as you would any other liquid soap.  You can apply chg directly  to the skin and wash                       Gently with a scrungie or clean washcloth.  5.  Apply the CHG Soap to your body ONLY FROM THE NECK DOWN.   Do not use on face/ open                           Wound or open sores. Avoid contact with eyes, ears mouth and genitals (private parts).                       Wash face,  Genitals (private parts) with your normal soap.             6.  Wash thoroughly, paying special attention to the area where your surgery  will be performed.  7.  Thoroughly rinse your body with warm water from the neck down.  8.  DO NOT shower/wash with your normal soap after using and rinsing off  the CHG Soap.                9.  Pat yourself dry with a clean  towel.            10.  Wear clean pajamas.            11.  Place clean sheets on your bed the night of your first shower and do not  sleep with pets. Day of Surgery : Do not apply any lotions/deodorants the morning of surgery.  Please wear clean clothes to the hospital/surgery center.  FAILURE TO FOLLOW THESE INSTRUCTIONS MAY RESULT IN THE CANCELLATION OF YOUR SURGERY PATIENT SIGNATURE_________________________________  NURSE SIGNATURE__________________________________  ________________________________________________________________________

## 2019-04-06 ENCOUNTER — Encounter (HOSPITAL_COMMUNITY)
Admission: RE | Admit: 2019-04-06 | Discharge: 2019-04-06 | Disposition: A | Discharge status: Home / Self Care | Payer: PPO | Source: Ambulatory Visit | Attending: General Surgery | Admitting: General Surgery

## 2019-04-06 ENCOUNTER — Other Ambulatory Visit: Payer: Self-pay

## 2019-04-06 ENCOUNTER — Encounter (HOSPITAL_COMMUNITY): Payer: Self-pay

## 2019-04-06 DIAGNOSIS — Z01812 Encounter for preprocedural laboratory examination: Secondary | ICD-10-CM | POA: Insufficient documentation

## 2019-04-06 HISTORY — DX: Gout, unspecified: M10.9

## 2019-04-06 LAB — HEMOGLOBIN A1C
Hgb A1c MFr Bld: 6.6 % — ABNORMAL HIGH (ref 4.8–5.6)
Mean Plasma Glucose: 142.72 mg/dL

## 2019-04-06 LAB — CBC
HCT: 43.9 % (ref 36.0–46.0)
Hemoglobin: 13.8 g/dL (ref 12.0–15.0)
MCH: 29.3 pg (ref 26.0–34.0)
MCHC: 31.4 g/dL (ref 30.0–36.0)
MCV: 93.2 fL (ref 80.0–100.0)
Platelets: 306 10*3/uL (ref 150–400)
RBC: 4.71 MIL/uL (ref 3.87–5.11)
RDW: 14.6 % (ref 11.5–15.5)
WBC: 10.7 10*3/uL — ABNORMAL HIGH (ref 4.0–10.5)
nRBC: 0 % (ref 0.0–0.2)

## 2019-04-06 LAB — BASIC METABOLIC PANEL
Anion gap: 9 (ref 5–15)
BUN: 20 mg/dL (ref 8–23)
CO2: 27 mmol/L (ref 22–32)
Calcium: 9.4 mg/dL (ref 8.9–10.3)
Chloride: 104 mmol/L (ref 98–111)
Creatinine, Ser: 0.87 mg/dL (ref 0.44–1.00)
GFR calc Af Amer: >60 mL/min (ref >60)
GFR calc non Af Amer: >60 mL/min (ref >60)
Glucose, Bld: 119 mg/dL — ABNORMAL HIGH (ref 70–99)
Potassium: 3.8 mmol/L (ref 3.5–5.1)
Sodium: 140 mmol/L (ref 135–145)

## 2019-04-06 LAB — GLUCOSE, CAPILLARY: Glucose-Capillary: 108 mg/dL — ABNORMAL HIGH (ref 70–99)

## 2019-04-07 NOTE — Progress Notes (Signed)
Anesthesia Chart Review   Case: [id number] Date/Time: 04/08/19 0830   Procedure: RE-EXCISION OF RIGHT BREAST ANTERIOR MARGINS (Right Breast)   Anesthesia type: General   Pre-op diagnosis: right breast dcis   [facility]   Surgeon: Jovita Kussmaul, MD      DISCUSSION:73 y.o. former smoker (32 pack years, quit 11/20/16) with h/o HTN, hypothyroidism, pre-diabetes, sleep apnea mild w/o device, HLD, CHF, right breast DCIS scheduled for above procedure 04/08/2019 with Dr. Autumn Messing.   Follows with cardiology Dr. Acie Fredrickson for hx of HFrEF-EF of around 44% in 2010.  Her EF has now normalized. She was last seen 02/25/19. Discussed that she would be having upcoming lumpectomy. No changes to management. Advised to f/u in 1 year.   TTE 2015: Study Conclusions   - Left ventricle: The cavity size was normal. Wall thickness  was normal. Systolic function was normal. The estimated  ejection fraction was in the range of 55% to 60%. Wall  motion was normal; there were no regional wall motion  abnormalities.  - Mitral valve: Mild, late systolicprolapse.  - Atrial septum: No defect or patent foramen ovale was  identified.  Transthoracic echocardiography. M-mode, complete 2D,  spectral Doppler, and color Doppler. Height: Height:  162.6cm. Height: 64in. Weight: Weight: 82.6kg. Weight:  181.6lb. Body mass index: BMI: 31.2kg/m^2. Body surface  area: BSA: 1.72m^2. Blood pressure: 170/98. Patient  status: Outpatient. Location: [facility]  S/p right breast lumpectomy and radioactive seed localization 03/18/2019 with no anesthesia complications.    Anticipate pt can proceed with planned procedure barring acute status change.   VS: BP (!) 161/77   Pulse (!) 56   Resp 18   Ht 5\' 3"  (1.6 m)   Wt 89.4 kg   SpO2 98%   BMI 34.92 kg/m   PROVIDERS: Susy Frizzle, MD is PCP   Mertie Moores, MD is Cardiologist  LABS: Labs reviewed: Acceptable for surgery. (all labs ordered are listed,  but only abnormal results are displayed)  Labs Reviewed  CBC - Abnormal; Notable for the following components:      Result Value   WBC 10.7 (*)    All other components within normal limits  BASIC METABOLIC PANEL - Abnormal; Notable for the following components:   Glucose, Bld 119 (*)    All other components within normal limits  HEMOGLOBIN A1C - Abnormal; Notable for the following components:   Hgb A1c MFr Bld 6.6 (*)    All other components within normal limits  GLUCOSE, CAPILLARY - Abnormal; Notable for the following components:   Glucose-Capillary 108 (*)    All other components within normal limits     IMAGES:   EKG: 02/25/2019 Rate 56 bpm Sinus bradycardia with sinus arrhythmia Left axis deviation  Nonspecific ST and T wave abnormality  CV: TTE 2015: Study Conclusions   - Left ventricle: The cavity size was normal. Wall thickness  was normal. Systolic function was normal. The estimated  ejection fraction was in the range of 55% to 60%. Wall  motion was normal; there were no regional wall motion  abnormalities.  - Mitral valve: Mild, late systolicprolapse.  - Atrial septum: No defect or patent foramen ovale was  identified.  Transthoracic echocardiography. M-mode, complete 2D,  spectral Doppler, and color Doppler. Height: Height:  162.6cm. Height: 64in. Weight: Weight: 82.6kg. Weight:  181.6lb. Body mass index: BMI: 31.2kg/m^2. Body surface  area: BSA: 1.57m^2. Blood pressure: 170/98. Patient  status: Outpatient. Location:  [facility] Past Medical History:  Diagnosis Date  . Ambulates with cane    straight cane  . Arthritis    knee, back  . CHF (congestive heart failure) (HCC)    EF 55-60%  pt. denies. Dr. did further test and could not confirm per pt.  . Ductal carcinoma in situ of breast   . Dysrhythmia    hx of - no current problems  . Gout   . Headache    hx of migraines - no longer a problem  . History of blood transfusion 1986   w/  Hysterectomy surgery  . Hyperlipidemia    diet controlled - no meds  . Hypertension   . Hypothyroidism   . Knee pain   . Pelvic kidney    lower right pelvic kidney   . Pre-diabetes   . Prediabetes    diet controlled - no meds  . SBO (small bowel obstruction) ([place]) 10/2016   surgery   . SBO (small bowel obstruction) ([place]) 10/2016  . Shoulder pain   . Sleep apnea    Mild - no mask needed per sleep study  . Smoker    quit smoking 2018  . Thyroid disease   . Tinnitus    BOTH EARS    Past Surgical History:  Procedure Laterality Date  . ABDOMINAL HYSTERECTOMY  1986   COMPLETE  . APPENDECTOMY     pt states it was removed when gallbladder was removed.  Marland Kitchen BACK SURGERY     lower back  . BREAST LUMPECTOMY WITH RADIOACTIVE SEED LOCALIZATION Right 03/18/2019   Procedure: RIGHT BREAST LUMPECTOMY WITH RADIOACTIVE SEED LOCALIZATION;  Surgeon: Jovita Kussmaul, MD;  Location: [place];  Service: General;  Laterality: Right;  . CHOLECYSTECTOMY    . COLONOSCOPY  02/15/2008   [name]   . ECTOPIC PREGNANCY SURGERY    . JOINT REPLACEMENT     Left hip total Dr. [name] 10-01-17  . polypectomy-oropharynx    . right knee meniscus    . SBO  10/2016   small bowel obstruction  . TOTAL HIP ARTHROPLASTY Left 10/01/2017   Procedure: LEFT  TOTAL HIP ARTHROPLASTY ANTERIOR APPROACH;  Surgeon: [name], MD;  [facility];  Service: Orthopedics;  Laterality: Left;  . TOTAL HIP ARTHROPLASTY Right 03/11/2018   Procedure: RIGHT TOTAL HIP ARTHROPLASTY ANTERIOR APPROACH;  Surgeon: [name], MD;  [facility];  Service: Orthopedics;  Laterality: Right;  141min  . UPPER GASTROINTESTINAL ENDOSCOPY      MEDICATIONS: . amLODipine (NORVASC) 10 MG tablet  . carvedilol (COREG) 3.125 MG tablet  . hydrochlorothiazide (HYDRODIURIL) 25 MG tablet  . hydroxypropyl methylcellulose / hypromellose (ISOPTO TEARS / GONIOVISC) 2.5 % ophthalmic solution  . levothyroxine (SYNTHROID, LEVOTHROID) 75 MCG tablet  .  predniSONE (DELTASONE) 20 MG tablet   No current facility-administered medications for this encounter.      Konrad Felix, PA-C [facility] [phone] 04/07/19  10:14 AM

## 2019-04-07 NOTE — Anesthesia Preprocedure Evaluation (Addendum)
Anesthesia Evaluation  Patient identified by MRN, date of birth, ID band Patient awake    Reviewed: Allergy & Precautions, NPO status , Patient's Chart, lab work & pertinent test results  Airway Mallampati: II  TM Distance: >3 FB Neck ROM: Full    Dental no notable dental hx.    Pulmonary sleep apnea , former smoker,    Pulmonary exam normal breath sounds clear to auscultation       Cardiovascular hypertension, +CHF  Normal cardiovascular exam+ dysrhythmias  Rhythm:Regular Rate:Normal     Neuro/Psych  Headaches, negative psych ROS   GI/Hepatic negative GI ROS, Neg liver ROS,   Endo/Other  Hypothyroidism   Renal/GU Renal InsufficiencyRenal disease  negative genitourinary   Musculoskeletal  (+) Arthritis , Osteoarthritis,    Abdominal (+) + obese,   Peds negative pediatric ROS (+)  Hematology negative hematology ROS (+)   Anesthesia Other Findings Breast Cancer  Reproductive/Obstetrics negative OB ROS                            Anesthesia Physical Anesthesia Plan  ASA: III  Anesthesia Plan: General   Post-op Pain Management:    Induction: Intravenous  PONV Risk Score and Plan: 3 and Ondansetron, Dexamethasone, Midazolam and Treatment may vary due to age or medical condition  Airway Management Planned: LMA  Additional Equipment:   Intra-op Plan:   Post-operative Plan: Extubation in OR  Informed Consent: I have reviewed the patients History and Physical, chart, labs and discussed the procedure including the risks, benefits and alternatives for the proposed anesthesia with the patient or authorized representative who has indicated his/her understanding and acceptance.     Dental advisory given  Plan Discussed with: CRNA  Anesthesia Plan Comments: (See PAT note 04/06/2019, Konrad Felix, PA-C)       Anesthesia Quick Evaluation

## 2019-04-08 ENCOUNTER — Encounter (HOSPITAL_COMMUNITY): Payer: Self-pay | Admitting: General Practice

## 2019-04-08 ENCOUNTER — Encounter (HOSPITAL_COMMUNITY)
Admission: RE | Disposition: A | Discharge status: Home / Self Care | Payer: Self-pay | Source: Home / Self Care | Attending: General Surgery

## 2019-04-08 ENCOUNTER — Ambulatory Visit (HOSPITAL_COMMUNITY): Payer: PPO | Admitting: Physician Assistant

## 2019-04-08 ENCOUNTER — Ambulatory Visit (HOSPITAL_COMMUNITY)
Admission: RE | Admit: 2019-04-08 | Discharge: 2019-04-08 | Disposition: A | Discharge status: Home / Self Care | Payer: PPO | Attending: General Surgery | Admitting: General Surgery

## 2019-04-08 ENCOUNTER — Ambulatory Visit (HOSPITAL_COMMUNITY): Payer: PPO | Admitting: Certified Registered Nurse Anesthetist

## 2019-04-08 DIAGNOSIS — Z888 Allergy status to other drugs, medicaments and biological substances status: Secondary | ICD-10-CM | POA: Diagnosis not present

## 2019-04-08 DIAGNOSIS — D0511 Intraductal carcinoma in situ of right breast: Secondary | ICD-10-CM | POA: Diagnosis not present

## 2019-04-08 DIAGNOSIS — Z79891 Long term (current) use of opiate analgesic: Secondary | ICD-10-CM | POA: Insufficient documentation

## 2019-04-08 DIAGNOSIS — I5022 Chronic systolic (congestive) heart failure: Secondary | ICD-10-CM | POA: Diagnosis not present

## 2019-04-08 DIAGNOSIS — Z791 Long term (current) use of non-steroidal anti-inflammatories (NSAID): Secondary | ICD-10-CM | POA: Diagnosis not present

## 2019-04-08 DIAGNOSIS — Z87891 Personal history of nicotine dependence: Secondary | ICD-10-CM | POA: Diagnosis not present

## 2019-04-08 DIAGNOSIS — N289 Disorder of kidney and ureter, unspecified: Secondary | ICD-10-CM | POA: Insufficient documentation

## 2019-04-08 DIAGNOSIS — M1610 Unilateral primary osteoarthritis, unspecified hip: Secondary | ICD-10-CM | POA: Insufficient documentation

## 2019-04-08 DIAGNOSIS — G4730 Sleep apnea, unspecified: Secondary | ICD-10-CM | POA: Diagnosis not present

## 2019-04-08 DIAGNOSIS — D241 Benign neoplasm of right breast: Secondary | ICD-10-CM | POA: Insufficient documentation

## 2019-04-08 DIAGNOSIS — Z79899 Other long term (current) drug therapy: Secondary | ICD-10-CM | POA: Insufficient documentation

## 2019-04-08 DIAGNOSIS — Z79890 Hormone replacement therapy (postmenopausal): Secondary | ICD-10-CM | POA: Insufficient documentation

## 2019-04-08 DIAGNOSIS — E039 Hypothyroidism, unspecified: Secondary | ICD-10-CM | POA: Insufficient documentation

## 2019-04-08 DIAGNOSIS — M1711 Unilateral primary osteoarthritis, right knee: Secondary | ICD-10-CM | POA: Insufficient documentation

## 2019-04-08 DIAGNOSIS — I110 Hypertensive heart disease with heart failure: Secondary | ICD-10-CM | POA: Insufficient documentation

## 2019-04-08 DIAGNOSIS — N641 Fat necrosis of breast: Secondary | ICD-10-CM | POA: Diagnosis not present

## 2019-04-08 DIAGNOSIS — N6011 Diffuse cystic mastopathy of right breast: Secondary | ICD-10-CM | POA: Diagnosis not present

## 2019-04-08 HISTORY — PX: RE-EXCISION OF BREAST CANCER,SUPERIOR MARGINS: SHX6047

## 2019-04-08 SURGERY — RE-EXCISION OF BREAST CANCER,SUPERIOR MARGINS
Anesthesia: General | Site: Breast | Laterality: Right

## 2019-04-08 MED ORDER — GLYCOPYRROLATE PF 0.2 MG/ML IJ SOSY
PREFILLED_SYRINGE | INTRAMUSCULAR | Status: AC
  Filled 2019-04-08: qty 1

## 2019-04-08 MED ORDER — EPHEDRINE 5 MG/ML INJ
INTRAVENOUS | Status: AC
  Filled 2019-04-08: qty 10

## 2019-04-08 MED ORDER — OXYCODONE HCL 5 MG/5ML PO SOLN: 5.0000 mg | Freq: Once | ORAL | Status: DC | PRN

## 2019-04-08 MED ORDER — CHLORHEXIDINE GLUCONATE CLOTH 2 % EX PADS: 6.0000 | MEDICATED_PAD | Freq: Once | CUTANEOUS | Status: DC

## 2019-04-08 MED ORDER — MIDAZOLAM HCL 5 MG/5ML IJ SOLN
INTRAMUSCULAR | Status: DC | PRN
  Administered 2019-04-08: 2 mg via INTRAVENOUS

## 2019-04-08 MED ORDER — ONDANSETRON 4 MG PO TBDP
ORAL_TABLET | ORAL | Status: AC
  Administered 2019-04-08: 11:00:00 4 mg
  Filled 2019-04-08: qty 1

## 2019-04-08 MED ORDER — DEXAMETHASONE SODIUM PHOSPHATE 10 MG/ML IJ SOLN
INTRAMUSCULAR | Status: DC | PRN
  Administered 2019-04-08: 8 mg via INTRAVENOUS

## 2019-04-08 MED ORDER — ACETAMINOPHEN 500 MG PO TABS
1000.0000 mg | ORAL_TABLET | ORAL | Status: AC
  Administered 2019-04-08: 07:00:00 1000 mg via ORAL
  Filled 2019-04-08: qty 2

## 2019-04-08 MED ORDER — OXYCODONE HCL 5 MG PO TABS: 5.0000 mg | ORAL_TABLET | Freq: Once | ORAL | Status: DC | PRN

## 2019-04-08 MED ORDER — LACTATED RINGERS IV SOLN
INTRAVENOUS | Status: DC
  Administered 2019-04-08: 07:00:00 via INTRAVENOUS
  Administered 2019-04-08: 1000 mL via INTRAVENOUS

## 2019-04-08 MED ORDER — BUPIVACAINE HCL (PF) 0.25 % IJ SOLN
INTRAMUSCULAR | Status: AC
  Filled 2019-04-08: qty 30

## 2019-04-08 MED ORDER — GLYCOPYRROLATE 0.2 MG/ML IJ SOLN
INTRAMUSCULAR | Status: DC | PRN
  Administered 2019-04-08: 0.2 mg via INTRAVENOUS

## 2019-04-08 MED ORDER — CEFAZOLIN SODIUM-DEXTROSE 2-4 GM/100ML-% IV SOLN
2.0000 g | INTRAVENOUS | Status: AC
  Administered 2019-04-08: 2 g via INTRAVENOUS
  Filled 2019-04-08: qty 100

## 2019-04-08 MED ORDER — LIDOCAINE HCL (CARDIAC) PF 100 MG/5ML IV SOSY
PREFILLED_SYRINGE | INTRAVENOUS | Status: DC | PRN
  Administered 2019-04-08: 50 mg via INTRAVENOUS

## 2019-04-08 MED ORDER — BUPIVACAINE HCL (PF) 0.25 % IJ SOLN
INTRAMUSCULAR | Status: DC | PRN
  Administered 2019-04-08: 25 mL

## 2019-04-08 MED ORDER — FENTANYL CITRATE (PF) 250 MCG/5ML IJ SOLN
INTRAMUSCULAR | Status: AC
  Filled 2019-04-08: qty 5

## 2019-04-08 MED ORDER — HYDROCODONE-ACETAMINOPHEN 5-325 MG PO TABS: 1.0000 | ORAL_TABLET | Freq: Four times a day (QID) | ORAL | 0 refills | Status: DC | PRN

## 2019-04-08 MED ORDER — 0.9 % SODIUM CHLORIDE (POUR BTL) OPTIME
TOPICAL | Status: DC | PRN
  Administered 2019-04-08: 1000 mL

## 2019-04-08 MED ORDER — EPHEDRINE SULFATE 50 MG/ML IJ SOLN
INTRAMUSCULAR | Status: DC | PRN
  Administered 2019-04-08: 5 mg via INTRAVENOUS

## 2019-04-08 MED ORDER — GABAPENTIN 300 MG PO CAPS
300.0000 mg | ORAL_CAPSULE | ORAL | Status: AC
  Administered 2019-04-08: 300 mg via ORAL
  Filled 2019-04-08: qty 1

## 2019-04-08 MED ORDER — SCOPOLAMINE 1 MG/3DAYS TD PT72
1.0000 | MEDICATED_PATCH | TRANSDERMAL | Status: DC
  Administered 2019-04-08: 1.5 mg via TRANSDERMAL
  Filled 2019-04-08: qty 1

## 2019-04-08 MED ORDER — ONDANSETRON HCL 4 MG/2ML IJ SOLN
INTRAMUSCULAR | Status: AC
  Filled 2019-04-08: qty 2

## 2019-04-08 MED ORDER — ONDANSETRON HCL 4 MG/2ML IJ SOLN
INTRAMUSCULAR | Status: DC | PRN
  Administered 2019-04-08: 4 mg via INTRAVENOUS

## 2019-04-08 MED ORDER — HYDROMORPHONE HCL 1 MG/ML IJ SOLN: 0.2500 mg | INTRAMUSCULAR | Status: DC | PRN

## 2019-04-08 MED ORDER — PROMETHAZINE HCL 25 MG/ML IJ SOLN
INTRAMUSCULAR | Status: AC
  Filled 2019-04-08: qty 1

## 2019-04-08 MED ORDER — CELECOXIB 200 MG PO CAPS
200.0000 mg | ORAL_CAPSULE | ORAL | Status: AC
  Administered 2019-04-08: 07:00:00 200 mg via ORAL
  Filled 2019-04-08: qty 1

## 2019-04-08 MED ORDER — MIDAZOLAM HCL 2 MG/2ML IJ SOLN
INTRAMUSCULAR | Status: AC
  Filled 2019-04-08: qty 2

## 2019-04-08 MED ORDER — FENTANYL CITRATE (PF) 250 MCG/5ML IJ SOLN
INTRAMUSCULAR | Status: DC | PRN
  Administered 2019-04-08: 25 ug via INTRAVENOUS
  Administered 2019-04-08: 50 ug via INTRAVENOUS

## 2019-04-08 MED ORDER — PROMETHAZINE HCL 25 MG/ML IJ SOLN
6.2500 mg | INTRAMUSCULAR | Status: DC | PRN
  Administered 2019-04-08: 11:00:00 6.25 mg via INTRAVENOUS

## 2019-04-08 MED ORDER — PROPOFOL 10 MG/ML IV BOLUS
INTRAVENOUS | Status: AC
  Filled 2019-04-08: qty 20

## 2019-04-08 MED ORDER — PROPOFOL 10 MG/ML IV BOLUS
INTRAVENOUS | Status: DC | PRN
  Administered 2019-04-08: 30 mg via INTRAVENOUS
  Administered 2019-04-08: 140 mg via INTRAVENOUS

## 2019-04-08 MED ORDER — LIDOCAINE 2% (20 MG/ML) 5 ML SYRINGE
INTRAMUSCULAR | Status: AC
  Filled 2019-04-08: qty 5

## 2019-04-08 MED ORDER — DEXAMETHASONE SODIUM PHOSPHATE 10 MG/ML IJ SOLN
INTRAMUSCULAR | Status: AC
  Filled 2019-04-08: qty 1

## 2019-04-08 SURGICAL SUPPLY — 22 items
ADH SKN CLS APL DERMABOND .7 (GAUZE/BANDAGES/DRESSINGS) ×1
COVER WAND RF STERILE (DRAPES) IMPLANT
DECANTER SPIKE VIAL GLASS SM (MISCELLANEOUS) ×3 IMPLANT
DERMABOND ADVANCED (GAUZE/BANDAGES/DRESSINGS) ×2
DERMABOND ADVANCED .7 DNX12 (GAUZE/BANDAGES/DRESSINGS) ×1 IMPLANT
DRAPE LAPAROSCOPIC ABDOMINAL (DRAPES) ×3 IMPLANT
ELECT COATED BLADE 2.86 ST (ELECTRODE) ×3 IMPLANT
ELECT REM PT RETURN 15FT ADLT (MISCELLANEOUS) ×3 IMPLANT
GAUZE 4X4 16PLY RFD (DISPOSABLE) IMPLANT
GLOVE BIO SURGEON STRL SZ7.5 (GLOVE) ×3 IMPLANT
GLOVE BIOGEL PI IND STRL 7.0 (GLOVE) ×1 IMPLANT
GLOVE BIOGEL PI INDICATOR 7.0 (GLOVE) ×2
GOWN STRL REUS W/ TWL XL LVL3 (GOWN DISPOSABLE) ×1 IMPLANT
GOWN STRL REUS W/TWL XL LVL3 (GOWN DISPOSABLE) ×6 IMPLANT
KIT BASIN OR (CUSTOM PROCEDURE TRAY) ×3 IMPLANT
KIT TURNOVER KIT A (KITS) IMPLANT
MARKER SKIN DUAL TIP RULER LAB (MISCELLANEOUS) IMPLANT
NEEDLE HYPO 22GX1.5 SAFETY (NEEDLE) ×3 IMPLANT
PACK GENERAL/GYN (CUSTOM PROCEDURE TRAY) ×3 IMPLANT
SUT VIC AB 3-0 SH 18 (SUTURE) ×2 IMPLANT
SYR CONTROL 10ML LL (SYRINGE) ×3 IMPLANT
TOWEL OR 17X26 10 PK STRL BLUE (TOWEL DISPOSABLE) ×3 IMPLANT

## 2019-04-08 NOTE — Anesthesia Postprocedure Evaluation (Signed)
Anesthesia Post Note  Patient: Adrienne [name]  Procedure(s) Performed: RE-EXCISION OF RIGHT BREAST ANTERIOR MARGINS (Right Breast)     Patient location during evaluation: PACU Anesthesia Type: General Level of consciousness: awake and alert Pain management: pain level controlled Vital Signs Assessment: post-procedure vital signs reviewed and stable Respiratory status: spontaneous breathing, nonlabored ventilation and respiratory function stable Cardiovascular status: blood pressure returned to baseline and stable Postop Assessment: no apparent nausea or vomiting Anesthetic complications: no    Last Vitals:  Vitals:   04/08/19 1019 04/08/19 1100  BP: (!) 155/85 (!) 143/84  Pulse: (!) 55 61  Resp: 16 14  Temp: 36.5 C   SpO2: 92% 93%    Last Pain:  Vitals:   04/08/19 1100  TempSrc:   PainSc: 0-No pain                 Lynda Rainwater

## 2019-04-08 NOTE — Interval H&P Note (Signed)
History and Physical Interval Note:  04/08/2019 8:21 AM  Adrienne [name]  has presented today for surgery, with the diagnosis of right breast dcis.  The various methods of treatment have been discussed with the patient and family. After consideration of risks, benefits and other options for treatment, the patient has consented to  Procedure(s): RE-EXCISION OF RIGHT BREAST ANTERIOR MARGINS (Right) as a surgical intervention.  The patient's history has been reviewed, patient examined, no change in status, stable for surgery.  I have reviewed the patient's chart and labs.  Questions were answered to the patient's satisfaction.     [name]

## 2019-04-08 NOTE — Op Note (Signed)
04/08/2019  9:43 AM  PATIENT:  Adrienne [name]  73 y.o. female  PRE-OPERATIVE DIAGNOSIS:  right breast dcis  POST-OPERATIVE DIAGNOSIS:  right breast dcis  PROCEDURE:  Procedure(s): RE-EXCISION OF RIGHT BREAST ANTERIOR MARGINS (Right)  SURGEON:  Surgeon(s) and Role:    * Jovita Kussmaul, MD - Primary  PHYSICIAN ASSISTANT:   ASSISTANTS: none   ANESTHESIA:   local and general  EBL:  5 mL   BLOOD ADMINISTERED:none  DRAINS: none   LOCAL MEDICATIONS USED:  MARCAINE     SPECIMEN:  Source of Specimen:  right breast anterior superior, anterior inferior, and medial margins  DISPOSITION OF SPECIMEN:  PATHOLOGY  COUNTS:  YES  TOURNIQUET:  * No tourniquets in log *  DICTATION: .Dragon Dictation   After informed consent was obtained the patient was brought to the operating room and placed in the supine position on the operating table.  After adequate induction of general anesthesia the patient's right breast was prepped with ChloraPrep, allowed to dry, and draped in usual sterile manner.  An appropriate timeout was performed.  The area around the previous incision was infiltrated with quarter percent Marcaine.  The previous inferior periareolar incision was reopened sharply with a 15 blade knife.  The lumpectomy cavity was identified.  The anterior and superior as well as the anterior and inferior margins were reexcised sharply with the electrocautery and marked with a stitch on the new true surgical margin.  I also removed the medial margin as well and marked it with a stitch on the new true surgical margin.  These were sent to pathology for further evaluation.  Hemostasis was achieved using the Bovie electrocautery.  The wound was irrigated with saline and infiltrated with more quarter percent Marcaine.  The deep layer of the wound was then closed with layers of interrupted 3-0 Vicryl stitches.  The skin was closed with a running 4-0 Monocryl subcuticular stitch.  Dermabond dressings  were applied.  The patient tolerated the procedure well.  At the end of the case all needle sponge and instrument counts were correct.  The patient was then awakened and taken to recovery in stable condition.  PLAN OF CARE: Discharge to home after PACU  PATIENT DISPOSITION:  PACU - hemodynamically stable.   Delay start of Pharmacological VTE agent (>24hrs) due to surgical blood loss or risk of bleeding: not applicable

## 2019-04-08 NOTE — Anesthesia Procedure Notes (Signed)
Procedure Name: LMA Insertion Date/Time: 04/08/2019 8:59 AM Performed by: Glory Buff, CRNA Pre-anesthesia Checklist: Patient identified, Emergency Drugs available, Suction available and Patient being monitored Patient Re-evaluated:Patient Re-evaluated prior to induction Oxygen Delivery Method: Circle system utilized Preoxygenation: Pre-oxygenation with 100% oxygen Induction Type: IV induction Ventilation: Mask ventilation without difficulty LMA: LMA inserted LMA Size: 4.0 Number of attempts: 1 Placement Confirmation: positive ETCO2 Tube secured with: Tape Dental Injury: Teeth and Oropharynx as per pre-operative assessment

## 2019-04-08 NOTE — H&P (Signed)
Adrienne [name]  Location: [facility] Surgery Patient #: [id number] DOB: Dec 21, 1945 Widowed / Language: [name] / Race: Black or African American Female   History of Present Illness The patient is a 73 year old female who presents for a follow-up for Breast cancer. The patient is a 73 year old black female who is about 2 weeks status post right breast lumpectomy for ductal carcinoma in situ that was estrogen and progesterone receptor positive. She tolerated the surgery well. I did take extra margins but unfortunately her anterior margin is still positive. This will need to be reexcised. She denies any significant pain.   Allergies  Lisinopril *ANTIHYPERTENSIVES*  Swelling. Atorvastatin Calcium *ANTIHYPERLIPIDEMICS*  Muscle aches Allergies Reconciled   Medication History  Norvasc (10MG  Tablet, Oral) Active. Coreg (3.125MG  Tablet, Oral) Active. HydroDiuril (25MG  Tablet, Oral) Active. Levothyroxine Sodium (75MCG Tablet, Oral) Active. Naproxen (500MG  Tablet, Oral) Active. HYDROcodone-Acetaminophen (5-325MG  Tablet, Oral) Active. Medications Reconciled    Review of Systems  General Not Present- Appetite Loss, Chills, Fatigue, Fever, Night Sweats, Weight Gain and Weight Loss. Skin Not Present- Change in Wart/Mole, Dryness, Hives, Jaundice, New Lesions, Non-Healing Wounds, Rash and Ulcer. HEENT Not Present- Earache, Hearing Loss, Hoarseness, Nose Bleed, Oral Ulcers, Ringing in the Ears, Seasonal Allergies, Sinus Pain, Sore Throat, Visual Disturbances, Wears glasses/contact lenses and Yellow Eyes. Respiratory Not Present- Bloody sputum, Chronic Cough, Difficulty Breathing, Snoring and Wheezing. Breast Not Present- Breast Mass, Breast Pain, Nipple Discharge and Skin Changes. Cardiovascular Not Present- Chest Pain, Difficulty Breathing Lying Down, Leg Cramps, Palpitations, Rapid Heart Rate, Shortness of Breath and Swelling of Extremities. Gastrointestinal Not Present-  Abdominal Pain, Bloating, Bloody Stool, Change in Bowel Habits, Chronic diarrhea, Constipation, Difficulty Swallowing, Excessive gas, Gets full quickly at meals, Hemorrhoids, Indigestion, Nausea, Rectal Pain and Vomiting. Female Genitourinary Not Present- Frequency, Nocturia, Painful Urination, Pelvic Pain and Urgency. Musculoskeletal Not Present- Back Pain, Joint Pain, Joint Stiffness, Muscle Pain, Muscle Weakness and Swelling of Extremities. Neurological Not Present- Decreased Memory, Fainting, Headaches, Numbness, Seizures, Tingling, Tremor, Trouble walking and Weakness. Psychiatric Not Present- Anxiety, Bipolar, Change in Sleep Pattern, Depression, Fearful and Frequent crying. Endocrine Not Present- Cold Intolerance, Excessive Hunger, Hair Changes, Heat Intolerance, Hot flashes and New Diabetes. Hematology Not Present- Blood Thinners, Easy Bruising, Excessive bleeding, Gland problems, HIV and Persistent Infections.  Vitals  Weight: 196 lb Height: 63in Body Surface Area: 1.92 m Body Mass Index: 34.72 kg/m  Temp.: 8F (Temporal)  Pulse: 79 (Regular)  BP: 146/84(Sitting, Left Arm, Standard)       Physical Exam General Mental Status-Alert. General Appearance-Consistent with stated age. Hydration-Well hydrated. Voice-Normal.  Head and Neck Head-normocephalic, atraumatic with no lesions or palpable masses. Trachea-midline. Thyroid Gland Characteristics - normal size and consistency.  Eye Eyeball - Bilateral-Extraocular movements intact. Sclera/Conjunctiva - Bilateral-No scleral icterus.  Chest and Lung Exam Chest and lung exam reveals -quiet, even and easy respiratory effort with no use of accessory muscles and on auscultation, normal breath sounds, no adventitious sounds and normal vocal resonance. Inspection Chest Wall - Normal. Back - normal.  Breast Note: The right breast periareolar incision is healing nicely with no sign of infection or  significant seroma.   Cardiovascular Cardiovascular examination reveals -normal heart sounds, regular rate and rhythm with no murmurs and normal pedal pulses bilaterally.  Abdomen Inspection Inspection of the abdomen reveals - No Hernias. Skin - Scar - no surgical scars. Palpation/Percussion Palpation and Percussion of the abdomen reveal - Soft, Non Tender, No Rebound tenderness, No Rigidity (guarding) and No hepatosplenomegaly. Auscultation  Auscultation of the abdomen reveals - Bowel sounds normal.  Neurologic Neurologic evaluation reveals -alert and oriented x 3 with no impairment of recent or remote memory. Mental Status-Normal.  Musculoskeletal Normal Exam - Left-Upper Extremity Strength Normal and Lower Extremity Strength Normal. Normal Exam - Right-Upper Extremity Strength Normal and Lower Extremity Strength Normal.  Lymphatic Head & Neck  General Head & Neck Lymphatics: Bilateral - Description - Normal. Axillary  General Axillary Region: Bilateral - Description - Normal. Tenderness - Non Tender. Femoral & Inguinal  Generalized Femoral & Inguinal Lymphatics: Bilateral - Description - Normal. Tenderness - Non Tender.    Assessment & Plan  DUCTAL CARCINOMA IN SITU (DCIS) OF RIGHT BREAST (D05.11) Impression: The patient is about 2 weeks status post right breast lumpectomy for ductal carcinoma in situ. Unfortunately she has a positive anterior margin which we are scheduled to re-excise next week. I have discussed with her in detail the risks and benefits of the operation as well as some of the technical aspects and she understands and wishes to proceed.  Current Plans Follow up with [place] in the office in 1 month.  Call us sooner as needed.

## 2019-04-08 NOTE — Transfer of Care (Signed)
Immediate Anesthesia Transfer of Care Note  Patient: Adrienne [name]  Procedure(s) Performed: RE-EXCISION OF RIGHT BREAST ANTERIOR MARGINS (Right Breast)  Patient Location: PACU  Anesthesia Type:General  Level of Consciousness: drowsy, patient cooperative and responds to stimulation  Airway & Oxygen Therapy: Patient Spontanous Breathing and Patient connected to face mask oxygen  Post-op Assessment: Report given to RN and Post -op Vital signs reviewed and stable  Post vital signs: Reviewed and stable  Last Vitals:  Vitals Value Taken Time  BP    Temp    Pulse 67 04/08/19 0951  Resp 13 04/08/19 0951  SpO2 100 % 04/08/19 0951  Vitals shown include unvalidated device data.  Last Pain:  Vitals:   04/08/19 0655  TempSrc:   PainSc: 0-No pain         Complications: No apparent anesthesia complications

## 2019-04-08 NOTE — Discharge Instructions (Signed)

## 2019-04-09 ENCOUNTER — Ambulatory Visit: Payer: PPO | Admitting: Radiation Oncology

## 2019-04-09 ENCOUNTER — Encounter (HOSPITAL_COMMUNITY): Payer: Self-pay | Admitting: General Surgery

## 2019-04-09 ENCOUNTER — Ambulatory Visit: Payer: PPO

## 2019-04-09 LAB — SURGICAL PATHOLOGY

## 2019-04-16 LAB — SURGICAL PATHOLOGY

## 2019-04-23 ENCOUNTER — Encounter: Payer: Self-pay | Admitting: Family Medicine

## 2019-04-23 ENCOUNTER — Other Ambulatory Visit: Payer: Self-pay

## 2019-04-23 ENCOUNTER — Ambulatory Visit (INDEPENDENT_AMBULATORY_CARE_PROVIDER_SITE_OTHER): Payer: PPO | Admitting: Family Medicine

## 2019-04-23 VITALS — BP 138/78 | HR 58 | Temp 97.4°F | Resp 14 | Ht 63.0 in | Wt 195.0 lb

## 2019-04-23 DIAGNOSIS — M25562 Pain in left knee: Secondary | ICD-10-CM

## 2019-04-23 DIAGNOSIS — M109 Gout, unspecified: Secondary | ICD-10-CM

## 2019-04-23 MED ORDER — INDOMETHACIN 50 MG PO CAPS: 50.0000 mg | ORAL_CAPSULE | Freq: Two times a day (BID) | ORAL | 0 refills | Status: DC

## 2019-04-23 NOTE — Progress Notes (Signed)
Subjective:    Patient ID: Adrienne [name], female    DOB: Jun 10, 1946, 73 y.o.   MRN: [id number]  HPI  04/02/19 Patient is a very pleasant 73 year old African-American female.  Since I last saw her, she has been diagnosed with ductal carcinoma in situ of the breast and is scheduled for reexcision later this month.  She presents today with a swollen left first MTP joint.  The joint is swollen and slightly edematous.  It is slightly warm to the touch.  It is painful to the touch.  Patient states that approximately 5 to 6 weeks ago this started.  She went to an urgent care where they did x-rays and told her that she had arthritis.  She was started on naproxen which did not help.  She has a family history of gout in her brother and and her niece although she has never been diagnosed with gout.  She has surgery scheduled next week for the reexcision so they have recommended that she avoid all NSAIDs.  At that time, my plan was: I believe the patient has podagra.  I recommended a prednisone taper pack given her upcoming surgery and her need to avoid NSAIDs.  I will check a uric acid level.  Reassess next week for improvement.  Patient has already had imaging performed in urgent care and per her report this was normal  04/23/19 Patient states she saw just a little bit of improvement on the prednisone however the pain never completely went away.  Pain persist in her left first MTP joint.  It is not erythematous.  It is swollen.  It is not warm.  However it hurts to flex or extend the MTP joint.  It hurts for her to bear weight on it.  She has had x-rays which ruled out any fracture.  2 weeks ago she also struck her left knee against the bathtub as she fell.  She twisted her knee as she fell.  She now has pain with ambulation.  It hurts to put weight on her knee.  The pain is primarily in the posterior aspect of her knee.  She has no laxity to varus or valgus stress.  She has a negative anterior and  posterior drawer sign.  However she has significant pain with Apley grind in the posterior lateral aspect of her knee Past Medical History:  Diagnosis Date  . Ambulates with cane    straight cane  . Arthritis    knee, back  . CHF (congestive heart failure) (HCC)    EF 55-60%  pt. denies. Dr. did further test and could not confirm per pt.  . Ductal carcinoma in situ of breast   . Dysrhythmia    hx of - no current problems  . Gout   . Headache    hx of migraines - no longer a problem  . History of blood transfusion 1986   w/ Hysterectomy surgery  . Hyperlipidemia    diet controlled - no meds  . Hypertension   . Hypothyroidism   . Knee pain   . Pelvic kidney    lower right pelvic kidney   . Pre-diabetes   . Prediabetes    diet controlled - no meds  . SBO (small bowel obstruction) ([place]) 10/2016   surgery   . SBO (small bowel obstruction) ([place]) 10/2016  . Shoulder pain   . Sleep apnea    Mild - no mask needed per sleep study  . Smoker  quit smoking 2018  . Thyroid disease   . Tinnitus    BOTH EARS   Past Surgical History:  Procedure Laterality Date  . ABDOMINAL HYSTERECTOMY  1986   COMPLETE  . APPENDECTOMY     pt states it was removed when gallbladder was removed.  Marland Kitchen BACK SURGERY     lower back  . BREAST LUMPECTOMY WITH RADIOACTIVE SEED LOCALIZATION Right 03/18/2019   Procedure: RIGHT BREAST LUMPECTOMY WITH RADIOACTIVE SEED LOCALIZATION;  Surgeon: Jovita Kussmaul, MD;  Location: [place];  Service: General;  Laterality: Right;  . CHOLECYSTECTOMY    . COLONOSCOPY  02/15/2008   [name]   . ECTOPIC PREGNANCY SURGERY    . JOINT REPLACEMENT     Left hip total Dr. [name] 10-01-17  . polypectomy-oropharynx    . RE-EXCISION OF BREAST CANCER,SUPERIOR MARGINS Right 04/08/2019   Procedure: RE-EXCISION OF RIGHT BREAST ANTERIOR MARGINS;  Surgeon: Jovita Kussmaul, MD;  Location: [facility];  Service: General;  Laterality: Right;  . right knee meniscus    . SBO  10/2016   small bowel  obstruction  . TOTAL HIP ARTHROPLASTY Left 10/01/2017   Procedure: LEFT  TOTAL HIP ARTHROPLASTY ANTERIOR APPROACH;  Surgeon: [name], MD;  Location: [facility];  Service: Orthopedics;  Laterality: Left;  . TOTAL HIP ARTHROPLASTY Right 03/11/2018   Procedure: RIGHT TOTAL HIP ARTHROPLASTY ANTERIOR APPROACH;  Surgeon: [name], MD;  Location: [facility];  Service: Orthopedics;  Laterality: Right;  152min  . UPPER GASTROINTESTINAL ENDOSCOPY     Current Outpatient Medications on File Prior to Visit  Medication Sig Dispense Refill  . amLODipine (NORVASC) 10 MG tablet Take 1 tablet (10 mg total) by mouth daily. 90 tablet 2  . carvedilol (COREG) 3.125 MG tablet Take 1 tablet (3.125 mg total) by mouth 2 (two) times daily with a meal. (Patient taking differently: Take 3.125 mg by mouth daily. ) 60 tablet 5  . hydrochlorothiazide (HYDRODIURIL) 25 MG tablet Take 1 tablet by mouth once daily (Patient taking differently: Take 25 mg by mouth daily. ) 90 tablet 0  . hydroxypropyl methylcellulose / hypromellose (ISOPTO TEARS / GONIOVISC) 2.5 % ophthalmic solution Place 1 drop into both eyes daily as needed for dry eyes.    Marland Kitchen levothyroxine (SYNTHROID, LEVOTHROID) 75 MCG tablet Take 1 tablet (75 mcg total) by mouth daily. 90 tablet 1   No current facility-administered medications on file prior to visit.    Allergies  Allergen Reactions  . Lisinopril Swelling    Angioedema  . Atorvastatin Other (See Comments)    Muscle aches     Social History   Socioeconomic History  . Marital status: Widowed    Spouse name: Not on file  . Number of children: Not on file  . Years of education: Not on file  . Highest education level: Not on file  Occupational History  . Occupation: retired  Scientific laboratory technician  . Financial resource strain: Not on file  . Food insecurity    Worry: Not on file    Inability: Not on file  . Transportation needs    Medical: Not on file    Non-medical: Not on file  Tobacco Use  .  Smoking status: Former Smoker    Packs/day: 1.00    Years: 32.00    Pack years: 32.00    Types: E-cigarettes, Cigarettes    Quit date: 11/20/2016    Years since quitting: 2.4  . Smokeless tobacco: Never Used  . Tobacco comment: Smoker since 29  has quit and started back several times!!,  Substance and Sexual Activity  . Alcohol use: No  . Drug use: No  . Sexual activity: Not Currently    Birth control/protection: Surgical    Comment: Hysterectomy  Lifestyle  . Physical activity    Days per week: Not on file    Minutes per session: Not on file  . Stress: Not on file  Relationships  . Social Herbalist on phone: Not on file    Gets together: Not on file    Attends religious service: Not on file    Active member of club or organization: Not on file    Attends meetings of clubs or organizations: Not on file    Relationship status: Not on file  . Intimate partner violence    Fear of current or ex partner: Not on file    Emotionally abused: Not on file    Physically abused: Not on file    Forced sexual activity: Not on file  Other Topics Concern  . Not on file  Social History Narrative  . Not on file      Review of Systems  All other systems reviewed and are negative.      Objective:   Physical Exam Vitals signs reviewed.  Constitutional:      General: She is not in acute distress.    Appearance: She is well-developed. She is not diaphoretic.  Neck:     Musculoskeletal: Normal range of motion.     Thyroid: No thyromegaly.  Cardiovascular:     Rate and Rhythm: Normal rate and regular rhythm.     Heart sounds: Normal heart sounds.  Pulmonary:     Effort: Pulmonary effort is normal.     Breath sounds: Normal breath sounds.  Musculoskeletal:     Left knee: She exhibits decreased range of motion, swelling, effusion and abnormal meniscus. She exhibits no LCL laxity and no MCL laxity. No medial joint line, no lateral joint line, no MCL and no LCL tenderness  noted.     Left foot: Tenderness, bony tenderness and swelling present.       Feet:  Neurological:     Mental Status: She is alert.           Assessment & Plan:  Podagra  Acute pain of left knee  Patient's uric acid level was 8.7.  I do believe she is having a gout exacerbation in her left first MTP joint.  Begin indomethacin 50 mg twice daily for the next 7 to 14 days.  Reassess in 1 to 2 weeks if no better.  I believe the pain in her left knee is likely a posterior lateral meniscal tear.  Using sterile technique, I injected the left knee with 2 cc lidocaine, 2 cc of Marcaine, and 2 cc of 40 mg/mL Kenalog.  If pain is not improving, we will need to get imaging of the left knee.

## 2019-05-04 ENCOUNTER — Other Ambulatory Visit: Payer: Self-pay | Admitting: General Surgery

## 2019-05-04 DIAGNOSIS — D0511 Intraductal carcinoma in situ of right breast: Secondary | ICD-10-CM

## 2019-05-06 ENCOUNTER — Other Ambulatory Visit: Payer: Self-pay | Admitting: General Surgery

## 2019-05-06 ENCOUNTER — Ambulatory Visit
Admission: RE | Admit: 2019-05-06 | Discharge: 2019-05-06 | Disposition: A | Discharge status: Home / Self Care | Payer: PPO | Source: Ambulatory Visit | Attending: General Surgery | Admitting: General Surgery

## 2019-05-06 ENCOUNTER — Other Ambulatory Visit: Payer: Self-pay

## 2019-05-06 DIAGNOSIS — R921 Mammographic calcification found on diagnostic imaging of breast: Secondary | ICD-10-CM | POA: Diagnosis not present

## 2019-05-06 DIAGNOSIS — D0511 Intraductal carcinoma in situ of right breast: Secondary | ICD-10-CM

## 2019-05-06 DIAGNOSIS — C50911 Malignant neoplasm of unspecified site of right female breast: Secondary | ICD-10-CM | POA: Diagnosis not present

## 2019-05-07 ENCOUNTER — Ambulatory Visit
Admission: RE | Admit: 2019-05-07 | Discharge: 2019-05-07 | Disposition: A | Discharge status: Home / Self Care | Payer: PPO | Source: Ambulatory Visit | Attending: Radiation Oncology | Admitting: Radiation Oncology

## 2019-05-07 ENCOUNTER — Ambulatory Visit: Payer: PPO

## 2019-05-07 ENCOUNTER — Ambulatory Visit: Payer: PPO | Admitting: Radiation Oncology

## 2019-05-12 ENCOUNTER — Other Ambulatory Visit: Payer: Self-pay

## 2019-05-12 ENCOUNTER — Ambulatory Visit
Admission: RE | Admit: 2019-05-12 | Discharge: 2019-05-12 | Disposition: A | Discharge status: Home / Self Care | Payer: PPO | Source: Ambulatory Visit | Attending: General Surgery | Admitting: General Surgery

## 2019-05-12 DIAGNOSIS — D0511 Intraductal carcinoma in situ of right breast: Secondary | ICD-10-CM

## 2019-05-12 DIAGNOSIS — R921 Mammographic calcification found on diagnostic imaging of breast: Secondary | ICD-10-CM | POA: Diagnosis not present

## 2019-05-12 HISTORY — PX: BREAST BIOPSY: SHX20

## 2019-05-17 ENCOUNTER — Other Ambulatory Visit: Payer: Self-pay | Admitting: Family Medicine

## 2019-05-28 ENCOUNTER — Telehealth: Payer: Self-pay | Admitting: Hematology

## 2019-05-28 NOTE — Telephone Encounter (Signed)
Dr. Marlou Starks informed me that she had more DCIS on biopsy after lumpectomy surgery, and will have mastectomy after the [facility]. Dr. Marlou Starks suggested her to take antiestrogen therapy while she is waiting for surgery. I called pt and discussed the benefit (preventing future breast cancer on contralateral breast) and potential side effects.  Given her low to intermediate grade DCIS, and mastectomy, her risk of future breast cancer is probably moderate, not very high.  I do not think antiestrogen therapy will impact her outcome of DCIS, and I think it would be safe if her mastectomy is postponed for a month due to the holiday. After the above discussion, pt decided not to take antiestrogen therapy before her surgery, and I will see her back after surgery.   Truitt Merle  05/28/2019

## 2019-06-22 ENCOUNTER — Encounter: Payer: Self-pay | Admitting: *Deleted

## 2019-06-30 ENCOUNTER — Ambulatory Visit: Payer: Self-pay | Admitting: General Surgery

## 2019-06-30 DIAGNOSIS — D0511 Intraductal carcinoma in situ of right breast: Secondary | ICD-10-CM

## 2019-07-02 ENCOUNTER — Other Ambulatory Visit: Payer: Self-pay | Admitting: General Surgery

## 2019-07-02 DIAGNOSIS — R921 Mammographic calcification found on diagnostic imaging of breast: Secondary | ICD-10-CM

## 2019-07-06 ENCOUNTER — Other Ambulatory Visit: Payer: Self-pay | Admitting: Family Medicine

## 2019-07-09 DIAGNOSIS — D0511 Intraductal carcinoma in situ of right breast: Secondary | ICD-10-CM | POA: Diagnosis not present

## 2019-07-19 ENCOUNTER — Encounter: Payer: Self-pay | Admitting: *Deleted

## 2019-07-20 ENCOUNTER — Encounter (HOSPITAL_BASED_OUTPATIENT_CLINIC_OR_DEPARTMENT_OTHER): Payer: Self-pay | Admitting: General Surgery

## 2019-07-20 ENCOUNTER — Other Ambulatory Visit: Payer: Self-pay

## 2019-07-22 ENCOUNTER — Other Ambulatory Visit (HOSPITAL_COMMUNITY)
Admission: RE | Admit: 2019-07-22 | Discharge: 2019-07-22 | Disposition: A | Discharge status: Home / Self Care | Payer: PPO | Source: Ambulatory Visit | Attending: Internal Medicine | Admitting: Internal Medicine

## 2019-07-22 ENCOUNTER — Encounter (HOSPITAL_BASED_OUTPATIENT_CLINIC_OR_DEPARTMENT_OTHER)
Admission: RE | Admit: 2019-07-22 | Discharge: 2019-07-22 | Disposition: A | Discharge status: Home / Self Care | Payer: PPO | Source: Ambulatory Visit | Attending: General Surgery | Admitting: General Surgery

## 2019-07-22 DIAGNOSIS — Z20822 Contact with and (suspected) exposure to covid-19: Secondary | ICD-10-CM | POA: Insufficient documentation

## 2019-07-22 DIAGNOSIS — Z01812 Encounter for preprocedural laboratory examination: Secondary | ICD-10-CM | POA: Insufficient documentation

## 2019-07-22 LAB — BASIC METABOLIC PANEL
Anion gap: 12 (ref 5–15)
BUN: 15 mg/dL (ref 8–23)
CO2: 26 mmol/L (ref 22–32)
Calcium: 9.1 mg/dL (ref 8.9–10.3)
Chloride: 103 mmol/L (ref 98–111)
Creatinine, Ser: 0.92 mg/dL (ref 0.44–1.00)
GFR calc Af Amer: >60 mL/min (ref >60)
GFR calc non Af Amer: >60 mL/min (ref >60)
Glucose, Bld: 119 mg/dL — ABNORMAL HIGH (ref 70–99)
Potassium: 3.9 mmol/L (ref 3.5–5.1)
Sodium: 141 mmol/L (ref 135–145)

## 2019-07-22 LAB — SARS CORONAVIRUS 2 (TAT 6-24 HRS): SARS Coronavirus 2: NEGATIVE

## 2019-07-22 NOTE — Progress Notes (Signed)
      Enhanced Recovery after Surgery for Orthopedics Enhanced Recovery after Surgery is a protocol used to improve the stress on your body and your recovery after surgery.  Patient Instructions  . The night before surgery:  o No food after midnight. ONLY clear liquids after midnight  . The day of surgery (if you do NOT have diabetes):  o Drink ONE (1) Pre-Surgery Clear Ensure as directed.   o This drink was given to you during your hospital  pre-op appointment visit. o The pre-op nurse will instruct you on the time to drink the  Pre-Surgery Ensure depending on your surgery time. o Finish the drink at the designated time by the pre-op nurse.  o Nothing else to drink after completing the  Pre-Surgery Clear Ensure.  . The day of surgery (if you have diabetes): o Drink ONE (1) Gatorade 2 (G2) as directed. o This drink was given to you during your hospital  pre-op appointment visit.  o The pre-op nurse will instruct you on the time to drink the   Gatorade 2 (G2) depending on your surgery time. o Color of the Gatorade may vary. Red is not allowed. o Nothing else to drink after completing the  Gatorade 2 (G2).         If you have questions, please contact your surgeon's office.  Surgical soap also given to patient with instructions for use.  Patient verbalized understanding of instructions.

## 2019-07-26 ENCOUNTER — Other Ambulatory Visit: Payer: Self-pay

## 2019-07-26 ENCOUNTER — Ambulatory Visit (HOSPITAL_BASED_OUTPATIENT_CLINIC_OR_DEPARTMENT_OTHER)
Admission: RE | Admit: 2019-07-26 | Discharge: 2019-07-27 | Disposition: A | Discharge status: Home / Self Care | Payer: PPO | Attending: General Surgery | Admitting: General Surgery

## 2019-07-26 ENCOUNTER — Ambulatory Visit (HOSPITAL_BASED_OUTPATIENT_CLINIC_OR_DEPARTMENT_OTHER): Payer: PPO | Admitting: Anesthesiology

## 2019-07-26 ENCOUNTER — Encounter: Payer: Self-pay | Admitting: *Deleted

## 2019-07-26 ENCOUNTER — Encounter (HOSPITAL_BASED_OUTPATIENT_CLINIC_OR_DEPARTMENT_OTHER): Payer: Self-pay | Admitting: General Surgery

## 2019-07-26 ENCOUNTER — Encounter (HOSPITAL_BASED_OUTPATIENT_CLINIC_OR_DEPARTMENT_OTHER)
Admission: RE | Disposition: A | Discharge status: Home / Self Care | Payer: Self-pay | Source: Home / Self Care | Attending: General Surgery

## 2019-07-26 ENCOUNTER — Ambulatory Visit (HOSPITAL_COMMUNITY)
Admission: RE | Admit: 2019-07-26 | Discharge: 2019-07-26 | Disposition: A | Discharge status: Home / Self Care | Payer: PPO | Source: Ambulatory Visit | Attending: General Surgery | Admitting: General Surgery

## 2019-07-26 DIAGNOSIS — Z888 Allergy status to other drugs, medicaments and biological substances status: Secondary | ICD-10-CM | POA: Insufficient documentation

## 2019-07-26 DIAGNOSIS — D0511 Intraductal carcinoma in situ of right breast: Secondary | ICD-10-CM | POA: Diagnosis not present

## 2019-07-26 DIAGNOSIS — Z79899 Other long term (current) drug therapy: Secondary | ICD-10-CM | POA: Insufficient documentation

## 2019-07-26 DIAGNOSIS — Z87891 Personal history of nicotine dependence: Secondary | ICD-10-CM | POA: Diagnosis not present

## 2019-07-26 DIAGNOSIS — Z170 Estrogen receptor positive status [ER+]: Secondary | ICD-10-CM | POA: Diagnosis not present

## 2019-07-26 DIAGNOSIS — E039 Hypothyroidism, unspecified: Secondary | ICD-10-CM | POA: Insufficient documentation

## 2019-07-26 DIAGNOSIS — Z791 Long term (current) use of non-steroidal anti-inflammatories (NSAID): Secondary | ICD-10-CM | POA: Insufficient documentation

## 2019-07-26 DIAGNOSIS — Z96649 Presence of unspecified artificial hip joint: Secondary | ICD-10-CM | POA: Insufficient documentation

## 2019-07-26 DIAGNOSIS — G4730 Sleep apnea, unspecified: Secondary | ICD-10-CM | POA: Diagnosis not present

## 2019-07-26 DIAGNOSIS — Z79890 Hormone replacement therapy (postmenopausal): Secondary | ICD-10-CM | POA: Diagnosis not present

## 2019-07-26 DIAGNOSIS — M1990 Unspecified osteoarthritis, unspecified site: Secondary | ICD-10-CM | POA: Insufficient documentation

## 2019-07-26 DIAGNOSIS — I10 Essential (primary) hypertension: Secondary | ICD-10-CM | POA: Insufficient documentation

## 2019-07-26 DIAGNOSIS — G8918 Other acute postprocedural pain: Secondary | ICD-10-CM | POA: Diagnosis not present

## 2019-07-26 DIAGNOSIS — E785 Hyperlipidemia, unspecified: Secondary | ICD-10-CM | POA: Diagnosis not present

## 2019-07-26 DIAGNOSIS — E876 Hypokalemia: Secondary | ICD-10-CM | POA: Diagnosis not present

## 2019-07-26 HISTORY — PX: SIMPLE MASTECTOMY WITH AXILLARY SENTINEL NODE BIOPSY: SHX6098

## 2019-07-26 HISTORY — PX: MASTECTOMY: SHX3

## 2019-07-26 SURGERY — SIMPLE MASTECTOMY WITH AXILLARY SENTINEL NODE BIOPSY
Anesthesia: General | Site: Breast | Laterality: Right

## 2019-07-26 MED ORDER — HYDROCHLOROTHIAZIDE 25 MG PO TABS: 25.0000 mg | ORAL_TABLET | Freq: Every day | ORAL | Status: DC

## 2019-07-26 MED ORDER — MIDAZOLAM HCL 2 MG/2ML IJ SOLN
1.0000 mg | INTRAMUSCULAR | Status: DC | PRN
  Administered 2019-07-26: 1 mg via INTRAVENOUS

## 2019-07-26 MED ORDER — MIDAZOLAM HCL 2 MG/2ML IJ SOLN
INTRAMUSCULAR | Status: AC
  Filled 2019-07-26: qty 2

## 2019-07-26 MED ORDER — ONDANSETRON HCL 4 MG/2ML IJ SOLN
4.0000 mg | Freq: Once | INTRAMUSCULAR | Status: AC | PRN
  Administered 2019-07-26: 4 mg via INTRAVENOUS

## 2019-07-26 MED ORDER — DEXAMETHASONE SODIUM PHOSPHATE 10 MG/ML IJ SOLN
INTRAMUSCULAR | Status: DC | PRN
  Administered 2019-07-26: 5 mg via INTRAVENOUS

## 2019-07-26 MED ORDER — FENTANYL CITRATE (PF) 100 MCG/2ML IJ SOLN
INTRAMUSCULAR | Status: AC
  Filled 2019-07-26: qty 2

## 2019-07-26 MED ORDER — PROPOFOL 10 MG/ML IV BOLUS
INTRAVENOUS | Status: AC
  Filled 2019-07-26: qty 20

## 2019-07-26 MED ORDER — ONDANSETRON HCL 4 MG/2ML IJ SOLN: 4.0000 mg | Freq: Four times a day (QID) | INTRAMUSCULAR | Status: DC | PRN

## 2019-07-26 MED ORDER — CEFAZOLIN SODIUM-DEXTROSE 2-4 GM/100ML-% IV SOLN
INTRAVENOUS | Status: AC
  Filled 2019-07-26: qty 100

## 2019-07-26 MED ORDER — CLONIDINE HCL (ANALGESIA) 100 MCG/ML EP SOLN
EPIDURAL | Status: DC | PRN
  Administered 2019-07-26: 100 ug

## 2019-07-26 MED ORDER — CEFAZOLIN SODIUM-DEXTROSE 2-4 GM/100ML-% IV SOLN
2.0000 g | INTRAVENOUS | Status: AC
  Administered 2019-07-26: 13:00:00 2 g via INTRAVENOUS

## 2019-07-26 MED ORDER — PROMETHAZINE HCL 25 MG/ML IJ SOLN
6.2500 mg | Freq: Four times a day (QID) | INTRAMUSCULAR | Status: DC | PRN
  Administered 2019-07-26: 6.25 mg via INTRAVENOUS

## 2019-07-26 MED ORDER — TECHNETIUM TC 99M SULFUR COLLOID FILTERED
1.0000 | Freq: Once | INTRAVENOUS | Status: AC | PRN
  Administered 2019-07-26: 1 via INTRADERMAL

## 2019-07-26 MED ORDER — LEVOTHYROXINE SODIUM 75 MCG PO TABS: 75.0000 ug | ORAL_TABLET | Freq: Every day | ORAL | Status: DC

## 2019-07-26 MED ORDER — GABAPENTIN 300 MG PO CAPS
ORAL_CAPSULE | ORAL | Status: AC
  Filled 2019-07-26: qty 1

## 2019-07-26 MED ORDER — HYDROCODONE-ACETAMINOPHEN 5-325 MG PO TABS: 1.0000 | ORAL_TABLET | ORAL | Status: DC | PRN

## 2019-07-26 MED ORDER — LIDOCAINE 2% (20 MG/ML) 5 ML SYRINGE
INTRAMUSCULAR | Status: DC | PRN
  Administered 2019-07-26: 100 mg via INTRAVENOUS

## 2019-07-26 MED ORDER — ACETAMINOPHEN 500 MG PO TABS
1000.0000 mg | ORAL_TABLET | ORAL | Status: AC
  Administered 2019-07-26: 1000 mg via ORAL

## 2019-07-26 MED ORDER — CHLORHEXIDINE GLUCONATE CLOTH 2 % EX PADS: 6.0000 | MEDICATED_PAD | Freq: Once | CUTANEOUS | Status: DC

## 2019-07-26 MED ORDER — FENTANYL CITRATE (PF) 100 MCG/2ML IJ SOLN
INTRAMUSCULAR | Status: DC | PRN
  Administered 2019-07-26 (×4): 25 ug via INTRAVENOUS

## 2019-07-26 MED ORDER — PANTOPRAZOLE SODIUM 40 MG IV SOLR
40.0000 mg | Freq: Every day | INTRAVENOUS | Status: DC
  Administered 2019-07-26: 40 mg via INTRAVENOUS
  Filled 2019-07-26: qty 40

## 2019-07-26 MED ORDER — BUPIVACAINE HCL (PF) 0.5 % IJ SOLN
INTRAMUSCULAR | Status: DC | PRN
  Administered 2019-07-26: 30 mL via PERINEURAL

## 2019-07-26 MED ORDER — KCL IN DEXTROSE-NACL 20-5-0.9 MEQ/L-%-% IV SOLN
INTRAVENOUS | Status: DC
  Filled 2019-07-26: qty 1000

## 2019-07-26 MED ORDER — FENTANYL CITRATE (PF) 100 MCG/2ML IJ SOLN
50.0000 ug | INTRAMUSCULAR | Status: DC | PRN
  Administered 2019-07-26: 50 ug via INTRAVENOUS

## 2019-07-26 MED ORDER — METHOCARBAMOL 750 MG PO TABS: 750.0000 mg | ORAL_TABLET | Freq: Four times a day (QID) | ORAL | 2 refills | Status: DC | PRN

## 2019-07-26 MED ORDER — AMLODIPINE BESYLATE 10 MG PO TABS: 10.0000 mg | ORAL_TABLET | Freq: Every day | ORAL | Status: DC

## 2019-07-26 MED ORDER — ACETAMINOPHEN 500 MG PO TABS
ORAL_TABLET | ORAL | Status: AC
  Filled 2019-07-26: qty 2

## 2019-07-26 MED ORDER — OXYCODONE HCL 5 MG PO TABS: 5.0000 mg | ORAL_TABLET | Freq: Once | ORAL | Status: DC | PRN

## 2019-07-26 MED ORDER — METHOCARBAMOL 500 MG PO TABS: 500.0000 mg | ORAL_TABLET | Freq: Four times a day (QID) | ORAL | Status: DC | PRN

## 2019-07-26 MED ORDER — MEPERIDINE HCL 25 MG/ML IJ SOLN: 6.2500 mg | INTRAMUSCULAR | Status: DC | PRN

## 2019-07-26 MED ORDER — ONDANSETRON 4 MG PO TBDP: 4.0000 mg | ORAL_TABLET | Freq: Four times a day (QID) | ORAL | Status: DC | PRN

## 2019-07-26 MED ORDER — PROPOFOL 10 MG/ML IV BOLUS
INTRAVENOUS | Status: DC | PRN
  Administered 2019-07-26: 140 mg via INTRAVENOUS

## 2019-07-26 MED ORDER — LACTATED RINGERS IV SOLN: INTRAVENOUS | Status: DC

## 2019-07-26 MED ORDER — CELECOXIB 200 MG PO CAPS
ORAL_CAPSULE | ORAL | Status: AC
  Filled 2019-07-26: qty 1

## 2019-07-26 MED ORDER — LIDOCAINE 2% (20 MG/ML) 5 ML SYRINGE
INTRAMUSCULAR | Status: AC
  Filled 2019-07-26: qty 5

## 2019-07-26 MED ORDER — GABAPENTIN 300 MG PO CAPS
300.0000 mg | ORAL_CAPSULE | ORAL | Status: AC
  Administered 2019-07-26: 300 mg via ORAL

## 2019-07-26 MED ORDER — ONDANSETRON HCL 4 MG/2ML IJ SOLN
INTRAMUSCULAR | Status: DC | PRN
  Administered 2019-07-26: 4 mg via INTRAVENOUS

## 2019-07-26 MED ORDER — METOCLOPRAMIDE HCL 5 MG/ML IJ SOLN
10.0000 mg | Freq: Once | INTRAMUSCULAR | Status: AC
  Administered 2019-07-26: 10 mg via INTRAVENOUS

## 2019-07-26 MED ORDER — EPHEDRINE SULFATE-NACL 50-0.9 MG/10ML-% IV SOSY
PREFILLED_SYRINGE | INTRAVENOUS | Status: DC | PRN
  Administered 2019-07-26: 7.5 mg via INTRAVENOUS
  Administered 2019-07-26 (×4): 5 mg via INTRAVENOUS

## 2019-07-26 MED ORDER — ONDANSETRON HCL 4 MG/2ML IJ SOLN
INTRAMUSCULAR | Status: AC
  Filled 2019-07-26: qty 2

## 2019-07-26 MED ORDER — CELECOXIB 200 MG PO CAPS
200.0000 mg | ORAL_CAPSULE | ORAL | Status: AC
  Administered 2019-07-26: 200 mg via ORAL

## 2019-07-26 MED ORDER — METOCLOPRAMIDE HCL 5 MG/ML IJ SOLN
INTRAMUSCULAR | Status: AC
  Filled 2019-07-26: qty 2

## 2019-07-26 MED ORDER — CARVEDILOL 3.125 MG PO TABS: 3.1250 mg | ORAL_TABLET | Freq: Two times a day (BID) | ORAL | Status: DC

## 2019-07-26 MED ORDER — PROMETHAZINE HCL 25 MG/ML IJ SOLN
INTRAMUSCULAR | Status: AC
  Filled 2019-07-26: qty 1

## 2019-07-26 MED ORDER — OXYCODONE HCL 5 MG/5ML PO SOLN: 5.0000 mg | Freq: Once | ORAL | Status: DC | PRN

## 2019-07-26 MED ORDER — HYDROCODONE-ACETAMINOPHEN 5-325 MG PO TABS: 1.0000 | ORAL_TABLET | Freq: Four times a day (QID) | ORAL | 0 refills | Status: DC | PRN

## 2019-07-26 MED ORDER — MORPHINE SULFATE (PF) 4 MG/ML IV SOLN: 1.0000 mg | INTRAVENOUS | Status: DC | PRN

## 2019-07-26 MED ORDER — HYDROMORPHONE HCL 1 MG/ML IJ SOLN: 0.2500 mg | INTRAMUSCULAR | Status: DC | PRN

## 2019-07-26 SURGICAL SUPPLY — 57 items
ADH SKN CLS APL DERMABOND .7 (GAUZE/BANDAGES/DRESSINGS) ×1
APL PRP STRL LF DISP 70% ISPRP (MISCELLANEOUS) ×1
APPLIER CLIP 9.375 MED OPEN (MISCELLANEOUS) ×3
APR CLP MED 9.3 20 MLT OPN (MISCELLANEOUS) ×1
BINDER BREAST XXLRG (GAUZE/BANDAGES/DRESSINGS) ×2 IMPLANT
BIOPATCH RED 1 DISK 7.0 (GAUZE/BANDAGES/DRESSINGS) ×1 IMPLANT
BIOPATCH RED 1IN DISK 7.0MM (GAUZE/BANDAGES/DRESSINGS) ×1
BLADE SURG 10 STRL SS (BLADE) ×3 IMPLANT
BLADE SURG 15 STRL LF DISP TIS (BLADE) ×1 IMPLANT
BLADE SURG 15 STRL SS (BLADE) ×3
CANISTER SUCT 1200ML W/VALVE (MISCELLANEOUS) ×3 IMPLANT
CHLORAPREP W/TINT 26 (MISCELLANEOUS) ×3 IMPLANT
CLIP APPLIE 9.375 MED OPEN (MISCELLANEOUS) ×1 IMPLANT
COVER BACK TABLE 60X90IN (DRAPES) ×3 IMPLANT
COVER MAYO STAND STRL (DRAPES) ×3 IMPLANT
COVER PROBE W GEL 5X96 (DRAPES) ×3 IMPLANT
COVER WAND RF STERILE (DRAPES) IMPLANT
DECANTER SPIKE VIAL GLASS SM (MISCELLANEOUS) IMPLANT
DERMABOND ADVANCED (GAUZE/BANDAGES/DRESSINGS) ×2
DERMABOND ADVANCED .7 DNX12 (GAUZE/BANDAGES/DRESSINGS) ×1 IMPLANT
DEVICE DISSECT PLASMABLAD 3.0S (MISCELLANEOUS) ×1 IMPLANT
DRAIN CHANNEL 19F RND (DRAIN) ×3 IMPLANT
DRAPE LAPAROSCOPIC ABDOMINAL (DRAPES) ×3 IMPLANT
DRAPE SURG 17X23 STRL (DRAPES) ×12 IMPLANT
DRAPE UTILITY XL STRL (DRAPES) ×3 IMPLANT
ELECT COATED BLADE 2.86 ST (ELECTRODE) ×3 IMPLANT
ELECT REM PT RETURN 9FT ADLT (ELECTROSURGICAL) ×3
ELECTRODE REM PT RTRN 9FT ADLT (ELECTROSURGICAL) ×1 IMPLANT
EVACUATOR SILICONE 100CC (DRAIN) ×3 IMPLANT
GAUZE SPONGE 4X4 12PLY STRL LF (GAUZE/BANDAGES/DRESSINGS) ×3 IMPLANT
GLOVE BIO SURGEON STRL SZ7.5 (GLOVE) ×3 IMPLANT
GLOVE BIOGEL M 6.5 STRL (GLOVE) ×4 IMPLANT
GLOVE BIOGEL PI IND STRL 6.5 (GLOVE) IMPLANT
GLOVE BIOGEL PI INDICATOR 6.5 (GLOVE) ×4
GOWN STRL REUS W/ TWL LRG LVL3 (GOWN DISPOSABLE) ×2 IMPLANT
GOWN STRL REUS W/TWL LRG LVL3 (GOWN DISPOSABLE) ×9
ILLUMINATOR WAVEGUIDE N/F (MISCELLANEOUS) IMPLANT
LIGHT WAVEGUIDE WIDE FLAT (MISCELLANEOUS) IMPLANT
NDL HYPO 25X1 1.5 SAFETY (NEEDLE) ×1 IMPLANT
NEEDLE HYPO 25X1 1.5 SAFETY (NEEDLE) ×3 IMPLANT
NS IRRIG 1000ML POUR BTL (IV SOLUTION) ×3 IMPLANT
PACK BASIN DAY SURGERY FS (CUSTOM PROCEDURE TRAY) ×3 IMPLANT
PENCIL SMOKE EVACUATOR (MISCELLANEOUS) ×3 IMPLANT
PIN SAFETY STERILE (MISCELLANEOUS) ×3 IMPLANT
PLASMABLADE 3.0S (MISCELLANEOUS)
SLEEVE SCD COMPRESS KNEE MED (MISCELLANEOUS) ×3 IMPLANT
SPONGE LAP 18X18 RF (DISPOSABLE) ×5 IMPLANT
SUT ETHILON 2 0 FS 18 (SUTURE) ×3 IMPLANT
SUT MNCRL AB 4-0 PS2 18 (SUTURE) ×3 IMPLANT
SUT SILK 2 0 SH (SUTURE) IMPLANT
SUT VICRYL 3-0 CR8 SH (SUTURE) ×4 IMPLANT
SYR CONTROL 10ML LL (SYRINGE) ×3 IMPLANT
TOWEL GREEN STERILE FF (TOWEL DISPOSABLE) ×3 IMPLANT
TRAY FOLEY W/BAG SLVR 14FR LF (SET/KITS/TRAYS/PACK) IMPLANT
TUBE CONNECTING 20'X1/4 (TUBING) ×1
TUBE CONNECTING 20X1/4 (TUBING) ×2 IMPLANT
YANKAUER SUCT BULB TIP NO VENT (SUCTIONS) ×3 IMPLANT

## 2019-07-26 NOTE — Transfer of Care (Signed)
Immediate Anesthesia Transfer of Care Note  Patient: Adrienne [name]  Procedure(s) Performed: RIGHT MASTECTOMY WITH SENTINEL NODE BIOPSY (Right Breast)  Patient Location: PACU  Anesthesia Type:General and Regional  Level of Consciousness: drowsy and patient cooperative  Airway & Oxygen Therapy: Patient Spontanous Breathing and Patient connected to face mask oxygen  Post-op Assessment: Report given to RN and Post -op Vital signs reviewed and stable  Post vital signs: Reviewed and stable  Last Vitals:  Vitals Value Taken Time  BP 135/75 07/26/19 1435  Temp    Pulse 52 07/26/19 1438  Resp 17 07/26/19 1438  SpO2 97 % 07/26/19 1438  Vitals shown include unvalidated device data.  Last Pain:  Vitals:   07/26/19 1021  TempSrc: Tympanic  PainSc: 0-No pain         Complications: No apparent anesthesia complications

## 2019-07-26 NOTE — Anesthesia Postprocedure Evaluation (Addendum)
Anesthesia Post Note  Patient: Adrienne [name]  Procedure(s) Performed: RIGHT MASTECTOMY WITH SENTINEL NODE BIOPSY (Right Breast)     Patient location during evaluation: PACU Anesthesia Type: General Level of consciousness: sedated and patient cooperative Pain management: pain level controlled Vital Signs Assessment: post-procedure vital signs reviewed and stable Respiratory status: spontaneous breathing Cardiovascular status: stable Anesthetic complications: yes Anesthetic complication details: PONV   Last Vitals:  Vitals:   07/26/19 1445 07/26/19 1500  BP: (!) 142/76 (!) 153/83  Pulse: (!) 52 64  Resp: 17 (!) 21  Temp:    SpO2: 97% 98%    Last Pain:  Vitals:   07/26/19 1500  TempSrc:   PainSc: 0-No pain                 Nolon Nations

## 2019-07-26 NOTE — Op Note (Signed)
07/26/2019  2:27 PM  PATIENT:  Adrienne [name]  74 y.o. female  PRE-OPERATIVE DIAGNOSIS:  right breast dcis  POST-OPERATIVE DIAGNOSIS:  right breast dcis  PROCEDURE:  Procedure(s): RIGHT MASTECTOMY WITH SENTINEL NODE BIOPSY (Right)  SURGEON:  Surgeon(s) and Role:    * Jovita Kussmaul, MD - Primary  PHYSICIAN ASSISTANT:   ASSISTANTS: Pryor Curia, RNFA   ANESTHESIA:   general  EBL:  minimal   BLOOD ADMINISTERED:none  DRAINS: (1) Jackson-Pratt drain(s) with closed bulb suction in the prepectoral space   LOCAL MEDICATIONS USED:  NONE  SPECIMEN:  Source of Specimen:  right mastectomy and sentinel node  DISPOSITION OF SPECIMEN:  PATHOLOGY  COUNTS:  YES  TOURNIQUET:  * No tourniquets in log *  DICTATION: .Dragon Dictation   After informed consent was obtained the patient was brought to the operating room and placed in the supine position on the operating table.  After adequate induction of general anesthesia the patient's right chest, breast, and axillary area were prepped with ChloraPrep, allowed to dry, and draped in usual sterile manner.  An appropriate timeout was performed.  Earlier in the day the patient underwent injection of 1 mCi of technetium sulfur colloid in the subareolar position on the right.  The neoprobe was set to technetium and an area of radioactivity was readily identified in the right axilla.  An elliptical incision was then made around the nipple and areola complex in order to minimize the excess skin.  The incision was carried through the skin and subcutaneous tissue sharply with electrocautery.  Breast hooks were used to elevate the skin flaps anteriorly towards the ceiling.  Thin skin flaps were then created circumferentially by dissecting between the breast tissue and the subcutaneous fat.  This dissection was carried all the way to the chest wall circumferentially.  Next the breast was removed from the pectoralis muscle with the pectoralis fascia.   Once this was accomplished the entire right breast was removed and marked with a stitch on the lateral skin.  This was sent to pathology for further evaluation.  The neoprobe was then used to direct blunt hemostat dissection in the deep right axillary space.  I was able to identify a hot lymph node.  This was excised sharply with the electrocautery and the surrounding lymphatics and small vessels were controlled with clips.  Ex vivo counts on this node was approximately 300.  No other hot or palpable nodes were identified in the right axilla.  The wound was then irrigated with copious amounts of saline.  Hemostasis was achieved using the Bovie electrocautery.  A small stab incision was made along the anterior axillary line inferior to the operative bed.  A tonsil clamp was placed through this opening and used to bring a 19 [place] round Blake drain into the operative bed.  The drain was anchored to the skin with a 2-0 nylon stitch.  The superior and inferior skin flaps were then grossly reapproximated with interrupted 3-0 Vicryl stitches.  The skin was then closed with a running 4-0 Monocryl subcuticular stitch.  Dermabond dressings were applied.  The patient tolerated the procedure well.  At the end of the case all needle sponge and instrument counts were correct.  The patient was then awakened and taken to recovery in stable condition.  The drain was placed to bulb suction and there was a good seal.  PLAN OF CARE: Admit for overnight observation  PATIENT DISPOSITION:  PACU - hemodynamically stable.   Delay  start of Pharmacological VTE agent (>24hrs) due to surgical blood loss or risk of bleeding: not applicable

## 2019-07-26 NOTE — Anesthesia Preprocedure Evaluation (Signed)
Anesthesia Evaluation  Patient identified by MRN, date of birth, ID band Patient awake    Reviewed: Allergy & Precautions, NPO status , Patient's Chart, lab work & pertinent test results  Airway Mallampati: II  TM Distance: >3 FB Neck ROM: Full    Dental  (+) Dental Advisory Given   Pulmonary sleep apnea , former smoker,    Pulmonary exam normal breath sounds clear to auscultation       Cardiovascular hypertension, +CHF  Normal cardiovascular exam+ dysrhythmias  Rhythm:Regular Rate:Normal     Neuro/Psych  Headaches, negative psych ROS   GI/Hepatic negative GI ROS, Neg liver ROS,   Endo/Other  Hypothyroidism   Renal/GU Renal InsufficiencyRenal disease  negative genitourinary   Musculoskeletal  (+) Arthritis , Osteoarthritis,    Abdominal (+) + obese,   Peds negative pediatric ROS (+)  Hematology negative hematology ROS (+)   Anesthesia Other Findings Breast Cancer  Reproductive/Obstetrics negative OB ROS                             Anesthesia Physical  Anesthesia Plan  ASA: III  Anesthesia Plan: General   Post-op Pain Management: GA combined w/ Regional for post-op pain   Induction: Intravenous  PONV Risk Score and Plan: 3 and Ondansetron, Dexamethasone, Midazolam and Treatment may vary due to age or medical condition  Airway Management Planned: LMA  Additional Equipment: None  Intra-op Plan:   Post-operative Plan: Extubation in OR  Informed Consent: I have reviewed the patients History and Physical, chart, labs and discussed the procedure including the risks, benefits and alternatives for the proposed anesthesia with the patient or authorized representative who has indicated his/her understanding and acceptance.     Dental advisory given  Plan Discussed with: CRNA  Anesthesia Plan Comments: (See PAT note 04/06/2019, Konrad Felix, PA-C)        Anesthesia Quick  Evaluation

## 2019-07-26 NOTE — Progress Notes (Signed)
Assisted Dr. Germeroth with right, ultrasound guided, pectoralis block. Side rails up, monitors on throughout procedure. See vital signs in flow sheet. Tolerated Procedure well. 

## 2019-07-26 NOTE — Addendum Note (Signed)
Addendum  created 07/26/19 1543 by Nolon Nations, MD   Clinical Note Signed

## 2019-07-26 NOTE — Anesthesia Procedure Notes (Signed)
Anesthesia Regional Block: Pectoralis block   Pre-Anesthetic Checklist: ,, timeout performed, Correct Patient, Correct Site, Correct Laterality, Correct Procedure, Correct Position, site marked, Risks and benefits discussed,  Surgical consent,  Pre-op evaluation,  At surgeon's request and post-op pain management  Laterality: Right  Prep: chloraprep       Needles:   Needle Type: Stimiplex     Needle Length: 9cm      Additional Needles:   Procedures:,,,, ultrasound used (permanent image in chart),,,,  Narrative:  Start time: 07/26/2019 11:54 AM End time: 07/26/2019 11:59 AM Injection made incrementally with aspirations every 5 mL.  Performed by: Personally  Anesthesiologist: Nolon Nations, MD  Additional Notes: Patient tolerated well. Good fascial spread noted.

## 2019-07-26 NOTE — Interval H&P Note (Signed)
History and Physical Interval Note:  07/26/2019 12:27 PM  Adrienne [name]  has presented today for surgery, with the diagnosis of right breast dcis.  The various methods of treatment have been discussed with the patient and family. After consideration of risks, benefits and other options for treatment, the patient has consented to  Procedure(s): RIGHT MASTECTOMY WITH SENTINEL NODE BIOPSY (Right) as a surgical intervention.  The patient's history has been reviewed, patient examined, no change in status, stable for surgery.  I have reviewed the patient's chart and labs.  Questions were answered to the patient's satisfaction.     [name]

## 2019-07-26 NOTE — H&P (Signed)
Adrienne [name]  Location: [facility] Surgery Patient #: [id number] DOB: 11/12/1945 Widowed / Language: Adrienne [name] / Race: Black or African American Female   History of Present Illness The patient is a 74 year old female who presents for a follow-up for Breast cancer. The patient is a 74 year old black female who is a couple weeks status post reexcision of the anterior margin of the right breast for ductal carcinoma in situ. Her final margin was close but clean anteriorly. She tolerated the surgery well. Prior to starting radiation therapy she got another follow-up mammogram which showed 2 new small areas of calcification in the medial breast that were not seen before. These were both biopsied and came back as ductal carcinoma in situ. I also reviewed her forearms from her insurance company. She does have cancer but apparently by their terms they do not include in situ carcinoma as cancer.   Allergies  Lisinopril *ANTIHYPERTENSIVES*  Swelling. Atorvastatin Calcium *ANTIHYPERLIPIDEMICS*  Muscle aches Allergies Reconciled   Medication History Indomethacin (50MG  Capsule, Oral) Active. Naproxen (500MG  Tablet, Oral) Active. HYDROcodone-Acetaminophen (5-325MG  Tablet, Oral) Active. Norvasc (10MG  Tablet, Oral) Active. Coreg (3.125MG  Tablet, Oral) Active. HydroDiuril (25MG  Tablet, Oral) Active. Levothyroxine Sodium (75MCG Tablet, Oral) Active. Medications Reconciled    Review of Systems  General Not Present- Appetite Loss, Chills, Fatigue, Fever, Night Sweats, Weight Gain and Weight Loss. Skin Not Present- Change in Wart/Mole, Dryness, Hives, Jaundice, New Lesions, Non-Healing Wounds, Rash and Ulcer. HEENT Not Present- Earache, Hearing Loss, Hoarseness, Nose Bleed, Oral Ulcers, Ringing in the Ears, Seasonal Allergies, Sinus Pain, Sore Throat, Visual Disturbances, Wears glasses/contact lenses and Yellow Eyes. Respiratory Not Present- Bloody sputum, Chronic Cough, Difficulty  Breathing, Snoring and Wheezing. Breast Not Present- Breast Mass, Breast Pain, Nipple Discharge and Skin Changes. Cardiovascular Not Present- Chest Pain, Difficulty Breathing Lying Down, Leg Cramps, Palpitations, Rapid Heart Rate, Shortness of Breath and Swelling of Extremities. Gastrointestinal Not Present- Abdominal Pain, Bloating, Bloody Stool, Change in Bowel Habits, Chronic diarrhea, Constipation, Difficulty Swallowing, Excessive gas, Gets full quickly at meals, Hemorrhoids, Indigestion, Nausea, Rectal Pain and Vomiting. Female Genitourinary Not Present- Frequency, Nocturia, Painful Urination, Pelvic Pain and Urgency. Musculoskeletal Not Present- Back Pain, Joint Pain, Joint Stiffness, Muscle Pain, Muscle Weakness and Swelling of Extremities. Neurological Not Present- Decreased Memory, Fainting, Headaches, Numbness, Seizures, Tingling, Tremor, Trouble walking and Weakness. Psychiatric Not Present- Anxiety, Bipolar, Change in Sleep Pattern, Depression, Fearful and Frequent crying. Endocrine Not Present- Cold Intolerance, Excessive Hunger, Hair Changes, Heat Intolerance, Hot flashes and New Diabetes. Hematology Not Present- Blood Thinners, Easy Bruising, Excessive bleeding, Gland problems, HIV and Persistent Infections.  Vitals  Weight: 194.4 lb Height: 63in Body Surface Area: 1.91 m Body Mass Index: 34.44 kg/m  Temp.: 97.89F  Pulse: 68 (Regular)  BP: 122/74 (Sitting, Left Arm, Standard)       Physical Exam  General Mental Status-Alert. General Appearance-Consistent with stated age. Hydration-Well hydrated. Voice-Normal.  Head and Neck Head-normocephalic, atraumatic with no lesions or palpable masses. Trachea-midline. Thyroid Gland Characteristics - normal size and consistency.  Eye Eyeball - Bilateral-Extraocular movements intact. Sclera/Conjunctiva - Bilateral-No scleral icterus.  Chest and Lung Exam Chest and lung exam reveals -quiet, even  and easy respiratory effort with no use of accessory muscles and on auscultation, normal breath sounds, no adventitious sounds and normal vocal resonance. Inspection Chest Wall - Normal. Back - normal.  Breast Note: The right breast inferior periareolar incision is healing nicely with no sign of infection or seroma. There is no palpable mass in either  breast. There is no palpable axillary, subclavicular, or cervical lymphadenopathy.   Cardiovascular Cardiovascular examination reveals -normal heart sounds, regular rate and rhythm with no murmurs and normal pedal pulses bilaterally.  Abdomen Inspection Inspection of the abdomen reveals - No Hernias. Skin - Scar - no surgical scars. Palpation/Percussion Palpation and Percussion of the abdomen reveal - Soft, Non Tender, No Rebound tenderness, No Rigidity (guarding) and No hepatosplenomegaly. Auscultation Auscultation of the abdomen reveals - Bowel sounds normal.  Neurologic Neurologic evaluation reveals -alert and oriented x 3 with no impairment of recent or remote memory. Mental Status-Normal.  Musculoskeletal Normal Exam - Left-Upper Extremity Strength Normal and Lower Extremity Strength Normal. Normal Exam - Right-Upper Extremity Strength Normal and Lower Extremity Strength Normal.  Lymphatic Head & Neck  General Head & Neck Lymphatics: Bilateral - Description - Normal. Axillary  General Axillary Region: Bilateral - Description - Normal. Tenderness - Non Tender. Femoral & Inguinal  Generalized Femoral & Inguinal Lymphatics: Bilateral - Description - Normal. Tenderness - Non Tender.    Assessment & Plan DUCTAL CARCINOMA IN SITU (DCIS) OF RIGHT BREAST (D05.11) Impression: The patient has had 2 recent surgeries for excision of ductal carcinoma in situ from the right breast. She has a close anterior margin and the rest of her margins were clean. Prior to starting radiation she underwent a mammogram that showed 2 new  areas of calcification in the medial right breast. These were both biopsied and came back as ductal carcinoma in situ. Her options at this point would be to have a third surgery to try to remove the new area seen and we would try to widen her anterior margin at the same time. The other option would be for mastectomy given the amount of disease that seems to be present that is difficult to see on imaging studies. I have talked to her in detail about the risks and benefits of the operation as well as some of the technical aspects and she understands. She would like to think about it and talk to her family before making a final decision between additional lumpectomy versus mastectomy.

## 2019-07-26 NOTE — Anesthesia Procedure Notes (Signed)
Procedure Name: LMA Insertion Date/Time: 07/26/2019 1:00 PM Performed by: Raenette Rover, CRNA Pre-anesthesia Checklist: Patient identified, Emergency Drugs available, Suction available and Patient being monitored Patient Re-evaluated:Patient Re-evaluated prior to induction Oxygen Delivery Method: Circle system utilized Preoxygenation: Pre-oxygenation with 100% oxygen Induction Type: IV induction LMA: LMA inserted LMA Size: 4.0 Number of attempts: 1 Placement Confirmation: positive ETCO2 and breath sounds checked- equal and bilateral Tube secured with: Tape Dental Injury: Teeth and Oropharynx as per pre-operative assessment

## 2019-07-26 NOTE — Progress Notes (Signed)
Assisted Shanin, nuc med tech, with nuc med injections. Side rails up, monitors on throughout procedure. See vital signs in flow sheet. Tolerated Procedure well. 

## 2019-07-27 NOTE — Progress Notes (Signed)
1 Day Post-Op   Subjective/Chief Complaint: No complaints   Objective: Vital signs in last 24 hours: Temp:  [97.6 F (36.4 C)-98.2 F (36.8 C)] 97.8 F (36.6 C) (02/02 1000) Pulse Rate:  [51-67] 52 (02/02 1000) Resp:  [14-28] 16 (02/02 1000) BP: (112-153)/(57-83) 112/57 (02/02 1000) SpO2:  [93 %-100 %] 96 % (02/02 1200)    Intake/Output from previous day: 02/01 0701 - 02/02 0700 In: [id number] [P.O.:240; I.V.:1100] Out: 57 [Urine:900; Drains:110; Blood:25] Intake/Output this shift: Total I/O In: 240 [P.O.:240] Out: 15 [Drains:15]  General appearance: alert and cooperative Resp: clear to auscultation bilaterally Chest wall: skin flaps look good Cardio: regular rate and rhythm GI: soft, non-tender; bowel sounds normal; no masses,  no organomegaly  Lab Results:  No results for input(s): WBC, HGB, HCT, PLT in the last 72 hours. BMET No results for input(s): NA, K, CL, CO2, GLUCOSE, BUN, CREATININE, CALCIUM in the last 72 hours. PT/INR No results for input(s): LABPROT, INR in the last 72 hours. ABG No results for input(s): PHART, HCO3 in the last 72 hours.  Invalid input(s): PCO2, PO2  Studies/Results: NM Sentinel Node Inj-No Rpt (Breast)  Result Date: 07/26/2019 Sulfur colloid was injected by the nuclear medicine technologist for melanoma sentinel node.    Anti-infectives: Anti-infectives (From admission, onward)   Start     Dose/Rate Route Frequency Ordered Stop   07/26/19 1015  ceFAZolin (ANCEF) IVPB 2g/100 mL premix     2 g 200 mL/hr over 30 Minutes Intravenous On call to O.R. 07/26/19 1000 07/26/19 1305      Assessment/Plan: s/p Procedure(s): RIGHT MASTECTOMY WITH SENTINEL NODE BIOPSY (Right) Advance diet Discharge  LOS: 0 days    Adrienne [name] 07/27/2019

## 2019-07-27 NOTE — Discharge Instructions (Signed)
°Post Anesthesia Home Care Instructions ° °Activity: °Get plenty of rest for the remainder of the day. A responsible individual must stay with you for 24 hours following the procedure.  °For the next 24 hours, DO NOT: °-Drive a car °-Operate machinery °-Drink alcoholic beverages °-Take any medication unless instructed by your physician °-Make any legal decisions or sign important papers. ° °Meals: °Start with liquid foods such as gelatin or soup. Progress to regular foods as tolerated. Avoid greasy, spicy, heavy foods. If nausea and/or vomiting occur, drink only clear liquids until the nausea and/or vomiting subsides. Call your physician if vomiting continues. ° °Special Instructions/Symptoms: °Your throat may feel dry or sore from the anesthesia or the breathing tube placed in your throat during surgery. If this causes discomfort, gargle with warm salt water. The discomfort should disappear within 24 hours. ° °If you had a scopolamine patch placed behind your ear for the management of post- operative nausea and/or vomiting: ° °1. The medication in the patch is effective for 72 hours, after which it should be removed.  Wrap patch in a tissue and discard in the trash. Wash hands thoroughly with soap and water. °2. You may remove the patch earlier than 72 hours if you experience unpleasant side effects which may include dry mouth, dizziness or visual disturbances. °3. Avoid touching the patch. Wash your hands with soap and water after contact with the patch. °   °About my Jackson-Pratt Bulb Drain ° °What is a Jackson-Pratt bulb? °A Jackson-Pratt is a soft, round device used to collect drainage. It is connected to a long, thin drainage catheter, which is held in place by one or two small stiches near your surgical incision site. When the bulb is squeezed, it forms a vacuum, forcing the drainage to empty into the bulb. ° °Emptying the Jackson-Pratt bulb- °To empty the bulb: °1. Release the plug on the top of the  bulb. °2. Pour the bulb's contents into a measuring container which your nurse will provide. °3. Record the time emptied and amount of drainage. Empty the drain(s) as often as your     doctor or nurse recommends. ° °Date                  Time                    Amount (Drain 1)                 Amount (Drain 2) ° °_____________________________________________________________________ ° °_____________________________________________________________________ ° °_____________________________________________________________________ ° °_____________________________________________________________________ ° °_____________________________________________________________________ ° °_____________________________________________________________________ ° °_____________________________________________________________________ ° °_____________________________________________________________________ ° °Squeezing the Jackson-Pratt Bulb- °To squeeze the bulb: °1. Make sure the plug at the top of the bulb is open. °2. Squeeze the bulb tightly in your fist. You will hear air squeezing from the bulb. °3. Replace the plug while the bulb is squeezed. °4. Use a safety pin to attach the bulb to your clothing. This will keep the catheter from     pulling at the bulb insertion site. ° °When to call your doctor- °Call your doctor if: °· Drain site becomes red, swollen or hot. °· You have a fever greater than 101 degrees F. °· There is oozing at the drain site. °· Drain falls out (apply a guaze bandage over the drain hole and secure it with tape). °· Drainage increases daily not related to activity patterns. (You will usually have more drainage when you are active than when you are resting.) °· Drainage has a   bad odor. ° ° ° ° ° ° ° ° °JP Drain Totals °· Bring this sheet to all of your post-operative appointments while you have your drains. °· Please measure your drains by CC's or ML's. °· Make sure you drain and measure your JP Drains 2 or 3  times per day. °· At the end of each day, add up totals for the left side and add up totals for the right side. °   ( 9 am )     ( 3 pm )        ( 9 pm )                °Date L  R  L  R  L  R  Total L/R  °               °               °               °               °               °               °               °               °               °               °               °               ° ° °

## 2019-07-28 ENCOUNTER — Encounter: Payer: Self-pay | Admitting: *Deleted

## 2019-07-28 LAB — SURGICAL PATHOLOGY

## 2019-07-28 NOTE — Discharge Summary (Signed)
Physician Discharge Summary  Patient ID: Adrienne [name] MRN: [id number] DOB/AGE: April 23, 1946 74 y.o.  Admit date: 07/26/2019 Discharge date: 07/28/2019  Admission Diagnoses:  Discharge Diagnoses:  Active Problems:   Ductal carcinoma in situ (DCIS) of right breast   Discharged Condition: good  Hospital Course: the pt underwent mastectomy. She tolerated surgery well. On pod 1 she was ready for d/c home  Consults: None  Significant Diagnostic Studies: none  Treatments: surgery: as above  Discharge Exam: Blood pressure 115/61, pulse 61, temperature 98.1 F (36.7 C), resp. rate 16, height 5\' 3"  (1.6 m), weight 91 kg, SpO2 96 %. Chest wall: no tenderness, skin flaps look good  Disposition: Discharge disposition: 01-Home or Self Care       Discharge Instructions    Call MD for:  difficulty breathing, headache or visual disturbances   Complete by: As directed    Call MD for:  extreme fatigue   Complete by: As directed    Call MD for:  hives   Complete by: As directed    Call MD for:  persistant dizziness or light-headedness   Complete by: As directed    Call MD for:  persistant nausea and vomiting   Complete by: As directed    Call MD for:  redness, tenderness, or signs of infection (pain, swelling, redness, odor or green/yellow discharge around incision site)   Complete by: As directed    Call MD for:  severe uncontrolled pain   Complete by: As directed    Call MD for:  temperature >100.4   Complete by: As directed    Diet - low sodium heart healthy   Complete by: As directed    Discharge instructions   Complete by: As directed    Sponge bathing while drains are in.  No overhead activity with affected arm.  Diet as tolerated.  Please wear the binder most of the time for the first couple weeks.   Increase activity slowly   Complete by: As directed    No wound care   Complete by: As directed      Allergies as of 07/27/2019      Reactions   Lisinopril Swelling    Angioedema   Atorvastatin Other (See Comments)   Muscle aches       Medication List    TAKE these medications   amLODipine 10 MG tablet Commonly known as: NORVASC Take 1 tablet (10 mg total) by mouth daily.   carvedilol 3.125 MG tablet Commonly known as: COREG TAKE 1 TABLET BY MOUTH TWICE DAILY WITH A MEAL   hydrochlorothiazide 25 MG tablet Commonly known as: HYDRODIURIL Take 1 tablet by mouth once daily   HYDROcodone-acetaminophen 5-325 MG tablet Commonly known as: NORCO/VICODIN Take 1-2 tablets by mouth every 6 (six) hours as needed for moderate pain or severe pain.   levothyroxine 75 MCG tablet Commonly known as: SYNTHROID Take 1 tablet (75 mcg total) by mouth daily.   methocarbamol 750 MG tablet Commonly known as: ROBAXIN Take 1 tablet (750 mg total) by mouth 4 (four) times daily as needed (use for muscle cramps/pain).      Follow-up Information    [name], MD In 2 weeks.   Specialty: General Surgery Contact information: [address] [place] [id number] [phone]           Signed: [name] 07/28/2019, 1:38 PM

## 2019-07-29 ENCOUNTER — Encounter: Payer: Self-pay | Admitting: *Deleted

## 2019-07-30 ENCOUNTER — Telehealth: Payer: Self-pay | Admitting: Hematology

## 2019-07-30 NOTE — Telephone Encounter (Signed)
Scheduled per 2/1 sch msg. Called and left a msg. Mailing printout

## 2019-08-02 ENCOUNTER — Encounter: Payer: Self-pay | Admitting: *Deleted

## 2019-08-04 ENCOUNTER — Telehealth: Payer: Self-pay | Admitting: *Deleted

## 2019-08-04 NOTE — Progress Notes (Signed)
[place]   [phone] Fax:([phone]   Clinic Follow up Note   Patient Care Team: Susy Frizzle, MD as PCP - General (Family Medicine) Nahser, Wonda Cheng, MD as PCP - Cardiology (Cardiology) Rockwell Germany, RN as Oncology Nurse Navigator Mauro Kaufmann, RN as Oncology Nurse Navigator Jovita Kussmaul, MD as Consulting Physician (General Surgery) Truitt Merle, MD as Consulting Physician (Hematology) Eppie Gibson, MD as Attending Physician (Radiation Oncology)  Date of Service:  08/11/2019  CHIEF COMPLAINT: F/u of right breast DCIS  SUMMARY OF ONCOLOGIC HISTORY: Oncology History  Ductal carcinoma in situ (DCIS) of right breast  01/28/2019 Mammogram   Diagnostic Mammogram 01/28/19  IMPRESSION: Right breast retroareolar 7 mm grouped pleomorphic calcifications are suspicious.   02/02/2019 Initial Biopsy   Diagnosis 02/02/19 Breast, right, needle core biopsy, outer retroareolar - DUCTAL CARCINOMA IN SITU WITH CALCIFICATIONS. Microscopic Comment The ductal carcinoma in situ is intermediate grade. Estrogen and progesterone receptors will be performed.  Results: IMMUNOHISTOCHEMICAL AND MORPHOMETRIC ANALYSIS PERFORMED MANUALLY Estrogen Receptor: 90%, POSITIVE, STRONG STAINING INTENSITY Progesterone Receptor: 20%, POSITIVE, STRONG STAINING INTENSITY    02/02/2019 Cancer Staging   Staging form: Breast, AJCC 8th Edition - Clinical stage from 02/02/2019: Stage 0 (cTis (DCIS), cN0, cM0, G2, ER+, PR+, HER2: Not Assessed) - Signed by Truitt Merle, MD on 02/10/2019   02/04/2019 Initial Diagnosis   Ductal carcinoma in situ (DCIS) of right breast   03/18/2019 Surgery   RIGHT BREAST LUMPECTOMY WITH RADIOACTIVE SEED LOCALIZATION by Dr. Marlou Starks 03/18/19   03/18/2019 Pathology Results   FINAL MICROSCOPIC DIAGNOSIS: 03/18/19 A. BREAST, RIGHT, LUMPECTOMY:  - Ductal carcinoma in situ with calcifications and necrosis, 1.4 cm.  - Ductal carcinoma in situ focally 0.1 cm from  medial and anterior  lumpectomy margins.  - Ductal carcinoma in situ involves the final anterior margin (See part  D).  - Ductal carcinoma in situ focally 0.3 cm from superior lumpectomy  margin.  - Biopsy site and biopsy clip.   B. BREAST, RIGHT LATERAL MARGIN, EXCISION:  - Benign breast tissue.  - No ductal carcinoma in situ.  - Final lateral margin greater than 1 cm.   C. BREAST, RIGHT MEDIAL MARGIN, EXCISION:  - Ductal carcinoma in situ, 0.3 cm.  - Ductal carcinoma in situ with focally 0.3 cm from final medial margin.   D. BREAST, RIGHT ANTERIOR MARGIN, EXCISION:  - Ductal carcinoma in situ, 1.5 cm.  - Ductal carcinoma in situ focally involves final anterior margin.   E. BREAST, RIGHT DEEP MARGIN, EXCISION:  - Fibrocystic changes.  - No ductal carcinoma in situ.  - Final posterior margin greater than 0.7 cm.    04/08/2019 Surgery   RE-EXCISION OF RIGHT BREAST ANTERIOR MARGINS by Dr Marlou Starks 04/08/19   04/08/2019 Pathology Results    DIAGNOSIS: 04/08/19  A. BREAST, RIGHT, ANTERIOR SUPERIOR, EXCISION:  - Intermediate grade ductal carcinoma in situ with necrosis.  - In situ carcinoma is <1 mm (not on ink) from the new anterior superior  margin, multifocally.  - Intraductal papilloma.  - Fibrocystic change.   B. BREAST, RIGHT, ANTERIOR INFERIOR, EXCISION:  - Benign breast tissue with resection changes.   C. BREAST, RIGHT, MEDIAL, EXCISION:  - Benign breast tissue with resection changes.  - Fibroadenomatoid and fibrocystic changes.   05/06/2019 Mammogram   Right Mammogram 05/06/19 IMPRESSION: Suspicious finding with 2 groups of microcalcifications in the medial right breast. The anterior group measures 2 x 2 x 2 mm. The more posterior  group measures 4 x 3 x 4 mm.   05/12/2019 Pathology Results   Diagnosis 05/12/19 1. Breast, right, needle core biopsy, medial - DUCTAL CARCINOMA IN SITU WITH CALCIFICATIONS. - SEE MICROSCOPIC DESCRIPTION. 2. Breast, right, needle  core biopsy, medial - DUCTAL CARCINOMA IN SITU WITH CALCIFICATIONS. - SEE MICROSCOPIC DESCRIPTION. Results: IMMUNOHISTOCHEMICAL AND MORPHOMETRIC ANALYSIS PERFORMED MANUALLY Estrogen Receptor: 100%, POSITIVE, STRONG STAINING INTENSITY Progesterone Receptor: 0%, NEGATIVE COMMENT: The negative hormone receptor study(ies) in this case has an internal positive control. Results: IMMUNOHISTOCHEMICAL AND MORPHOMETRIC ANALYSIS PERFORMED MANUALLY Estrogen Receptor: 100%, POSITIVE, STRONG STAINING INTENSITY Progesterone Receptor: 0%, NEGATIVE COMMENT: The negative hormone receptor study(ies) in this case has an internal positive control.   07/26/2019 Surgery   RIGHT MASTECTOMY WITH SENTINEL NODE BIOPSY by Dr. Marlou Starks 07/26/19   07/26/2019 Pathology Results   FINAL MICROSCOPIC DIAGNOSIS: 07/26/19  A. LYMPH NODE, RIGHT #1, SENTINEL,  BIOPSY:  -  No carcinoma identified in one lymph node (0/1)   B. BREAST, RIGHT, MASTECTOMY:  -  Ductal carcinoma in situ, intermediate grade, 0.6 cm  -  Margins uninvolved by carcinoma (greater than 1 cm; posterior margin)  -  Previous biopsy site changes (x2)  -  See oncology table below   07/26/2019 Cancer Staging   Staging form: Breast, AJCC 8th Edition - Pathologic stage from 07/26/2019: Stage 0 (pTis (DCIS), pN0, cM0, G2, ER+, PR+, HER2: Not Assessed) - Signed by Truitt Merle, MD on 08/10/2019      CURRENT THERAPY:  Surveillance   INTERVAL HISTORY:  Adrienne [name] is here for a follow up post surgery. She was last seen by me 6 months ago. She presents to the clinic alone. She notes she is recovering from surgery and only has very little pain from her draining tube from her mastectomy. She plans to f/u with Dr. Marlou Starks today. She notes she has had hysterectomy and BSO before and her last DEXA in 2015 was normal. She notes she did not experience much of her Menopause after her Hysterectomy. She did take hormonal replacement for a short amount of time.     REVIEW OF  SYSTEMS:   Constitutional: Denies fevers, chills or abnormal weight loss Eyes: Denies blurriness of vision Ears, nose, mouth, throat, and face: Denies mucositis or sore throat Respiratory: Denies cough, dyspnea or wheezes Cardiovascular: Denies palpitation, chest discomfort or lower extremity swelling Gastrointestinal:  Denies nausea, heartburn or change in bowel habits Skin: Denies abnormal skin rashes Lymphatics: Denies new lymphadenopathy or easy bruising Neurological:Denies numbness, tingling or new weaknesses Behavioral/Psych: Mood is stable, no new changes  All other systems were reviewed with the patient and are negative.  MEDICAL HISTORY:  Past Medical History:  Diagnosis Date  . Ambulates with cane    straight cane  . Arthritis    knee, back  . CHF (congestive heart failure) (HCC)    EF 55-60%  pt. denies. Dr. did further test and could not confirm per pt.  . Ductal carcinoma in situ of breast   . Gout   . History of blood transfusion 1986   w/ Hysterectomy surgery  . Hyperlipidemia    diet controlled - no meds  . Hypertension   . Hypothyroidism   . Knee pain   . Pelvic kidney    lower right pelvic kidney   . Pre-diabetes   . Prediabetes    diet controlled - no meds  . SBO (small bowel obstruction) ([place]) 10/2016   surgery   . SBO (small bowel obstruction) (  [place]) 10/2016  . Shoulder pain   . Sleep apnea    Mild - no mask needed per sleep study  . Smoker    quit smoking 2018  . Thyroid disease   . Tinnitus    BOTH EARS    SURGICAL HISTORY: Past Surgical History:  Procedure Laterality Date  . ABDOMINAL HYSTERECTOMY  1986   COMPLETE  . APPENDECTOMY     pt states it was removed when gallbladder was removed.  Marland Kitchen BACK SURGERY     lower back  . BREAST LUMPECTOMY WITH RADIOACTIVE SEED LOCALIZATION Right 03/18/2019   Procedure: RIGHT BREAST LUMPECTOMY WITH RADIOACTIVE SEED LOCALIZATION;  Surgeon: Jovita Kussmaul, MD;  Location: [facility];  Service: General;   Laterality: Right;  . CHOLECYSTECTOMY    . COLONOSCOPY  02/15/2008   [name]   . ECTOPIC PREGNANCY SURGERY    . JOINT REPLACEMENT     Left hip total Dr. [name] 10-01-17  . polypectomy-oropharynx    . RE-EXCISION OF BREAST CANCER,SUPERIOR MARGINS Right 04/08/2019   Procedure: RE-EXCISION OF RIGHT BREAST ANTERIOR MARGINS;  Surgeon: Jovita Kussmaul, MD;  Location: [facility];  Service: General;  Laterality: Right;  . right knee meniscus    . SBO  10/2016   small bowel obstruction  . SIMPLE MASTECTOMY WITH AXILLARY SENTINEL NODE BIOPSY Right 07/26/2019   Procedure: RIGHT MASTECTOMY WITH SENTINEL NODE BIOPSY;  Surgeon: Jovita Kussmaul, MD;  Location: [facility];  Service: General;  Laterality: Right;  . TOTAL HIP ARTHROPLASTY Left 10/01/2017   Procedure: LEFT  TOTAL HIP ARTHROPLASTY ANTERIOR APPROACH;  Surgeon: [name], MD;  Location: [facility];  Service: Orthopedics;  Laterality: Left;  . TOTAL HIP ARTHROPLASTY Right 03/11/2018   Procedure: RIGHT TOTAL HIP ARTHROPLASTY ANTERIOR APPROACH;  Surgeon: [name], MD;  Location: [facility];  Service: Orthopedics;  Laterality: Right;  114mn  . UPPER GASTROINTESTINAL ENDOSCOPY      I have reviewed the social history and family history with the patient and they are unchanged from previous note.  ALLERGIES:  is allergic to lisinopril and atorvastatin.  MEDICATIONS:  Current Outpatient Medications  Medication Sig Dispense Refill  . amLODipine (NORVASC) 10 MG tablet Take 1 tablet (10 mg total) by mouth daily. 90 tablet 2  . carvedilol (COREG) 3.125 MG tablet TAKE 1 TABLET BY MOUTH TWICE DAILY WITH A MEAL 60 tablet 3  . hydrochlorothiazide (HYDRODIURIL) 25 MG tablet Take 1 tablet by mouth once daily 90 tablet 0  . levothyroxine (SYNTHROID) 75 MCG tablet Take by mouth.     No current facility-administered medications for this visit.    PHYSICAL EXAMINATION: ECOG PERFORMANCE STATUS: 1 - Symptomatic but completely ambulatory  Vitals:    08/11/19 1158  BP: (!) 137/59  Pulse: 62  Resp: 17  Temp: 98 F (36.7 C)  SpO2: 98%   Filed Weights   08/11/19 1158  Weight: 202 lb 4.8 oz (91.8 kg)    GENERAL:alert, no distress and comfortable SKIN: skin color, texture, turgor are normal, no rashes or significant lesions EYES: normal, Conjunctiva are pink and non-injected, sclera clear  NECK: supple, thyroid normal size, non-tender, without nodularity LYMPH:  no palpable lymphadenopathy in the cervical, axillary  LUNGS: clear to auscultation and percussion with normal breathing effort HEART: regular rate & rhythm and no murmurs and no lower extremity edema ABDOMEN:abdomen soft, non-tender and normal bowel sounds Musculoskeletal:no cyanosis of digits and no clubbing  NEURO: alert & oriented x 3 with fluent speech, no  focal motor/sensory deficits BREAST: S/p right mastectomy: Surgical incision healing well (+) right breast draining tube in place. No palpable mass, nodules or adenopathy bilaterally. Breast exam benign.   LABORATORY DATA:  I have reviewed the data as listed CBC Latest Ref Rng & Units 04/06/2019 03/15/2019 02/10/2019  WBC 4.0 - 10.5 K/uL 10.7(H) 10.3 9.1  Hemoglobin 12.0 - 15.0 g/dL 13.8 14.4 13.6  Hematocrit 36.0 - 46.0 % 43.9 43.5 41.7  Platelets 150 - 400 K/uL 306 282 294     CMP Latest Ref Rng & Units 07/22/2019 04/06/2019 03/15/2019  Glucose 70 - 99 mg/dL 119(H) 119(H) 128(H)  BUN 8 - 23 mg/dL '15 20 16  ' Creatinine 0.44 - 1.00 mg/dL 0.92 0.87 0.96  Sodium 135 - 145 mmol/L 141 140 140  Potassium 3.5 - 5.1 mmol/L 3.9 3.8 4.5  Chloride 98 - 111 mmol/L 103 104 105  CO2 22 - 32 mmol/L '26 27 27  ' Calcium 8.9 - 10.3 mg/dL 9.1 9.4 9.4  Total Protein 6.5 - 8.1 g/dL - - -  Total Bilirubin 0.3 - 1.2 mg/dL - - -  Alkaline Phos 38 - 126 U/L - - -  AST 15 - 41 U/L - - -  ALT 0 - 44 U/L - - -      RADIOGRAPHIC STUDIES: I have personally reviewed the radiological images as listed and agreed with the findings in the  report. No results found.   ASSESSMENT & PLAN:  Adrienne [name] is a 74 y.o. female with    1. Ductal carcinoma in situ (DCIS) of right breast, Stage 0, ER+/PR+, Intermediate Grade -She was diagnosed in 01/2019. To cure her DCIS she underwent right lumpectomy with Dr. Marlou Starks on 03/18/19. Due to positive margins she underwent re-excision surgery on 04/08/19 and mastectomy on 07/26/2019.  -her surgical path showed a DCIS, final surgical margins were negative. -I discussed although her cancer is currently cured, she has high risk of developing breast cancer in her left breast.  -Given she underwent node negative right mastectomy, she does not require adjuvant radiation.  -Given her ER/PR positive disease and high risk of future left breast cancer, I discussed adjuvant endocrine therapy with Aromatase inhibitor or Tamoxifen which can reduce her risk of future breast cancer by half.   -The potential side effects of Tamoxifen, which includes but not limited to, hot flash, skin and vaginal dryness, slightly increased risk of cardiovascular disease and cataract, small risk of thrombosis and endometrial cancer, were discussed with her in great details.   -The potential benefit and side effects of AI, which includes but not limited to, hot flash, skin and vaginal dryness, metabolic changes ( increased blood glucose, cholesterol, weight, etc.), slightly in increased risk of cardiovascular disease, cataracts, muscular and joint discomfort, osteopenia and osteoporosis, etc, were discussed with her in great details. -She understands her risk of recurrence and benefit of antiestrogen therapy. She opted to not proceed with any antiestrogen therapy.  Given her advanced age, I think this is a reasonable decision. -We also discussed the breast cancer surveillance. She will continue annual screening mammogram, self exam, and a routine office visit with lab and exam. She opted to continue surveillance with her other  physicians, this is understandable.  -I will f/u with her as needed in the future.    2. Bone health  -Her 12/2013 DEXA showed normal bone density. Lowest T-score -0.3 of left femur.    PLAN:  -Pt declined adjuvant antiestrogen therapy  -she plan  to get bra prosthesis at [facility]  -F/u as needed in the future    No problem-specific Assessment & Plan notes found for this encounter.   No orders of the defined types were placed in this encounter.  All questions were answered. The patient knows to call the clinic with any problems, questions or concerns. No barriers to learning was detected. The total time spent in the appointment was 30 minutes.     Truitt Merle, MD 08/11/2019   I, Joslyn Devon, am acting as scribe for Truitt Merle, MD.   I have reviewed the above documentation for accuracy and completeness, and I agree with the above.

## 2019-08-11 ENCOUNTER — Telehealth: Payer: Self-pay | Admitting: Hematology

## 2019-08-11 ENCOUNTER — Encounter: Payer: Self-pay | Admitting: Hematology

## 2019-08-11 ENCOUNTER — Inpatient Hospital Stay: Payer: PPO | Attending: Hematology | Admitting: Hematology

## 2019-08-11 ENCOUNTER — Encounter: Payer: Self-pay | Admitting: *Deleted

## 2019-08-11 ENCOUNTER — Other Ambulatory Visit: Payer: Self-pay

## 2019-08-11 VITALS — BP 137/59 | HR 62 | Temp 98.0°F | Resp 17 | Ht 63.0 in | Wt 202.3 lb

## 2019-08-11 DIAGNOSIS — Z90722 Acquired absence of ovaries, bilateral: Secondary | ICD-10-CM | POA: Diagnosis not present

## 2019-08-11 DIAGNOSIS — Z9079 Acquired absence of other genital organ(s): Secondary | ICD-10-CM | POA: Insufficient documentation

## 2019-08-11 DIAGNOSIS — Z90710 Acquired absence of both cervix and uterus: Secondary | ICD-10-CM | POA: Insufficient documentation

## 2019-08-11 DIAGNOSIS — D0511 Intraductal carcinoma in situ of right breast: Secondary | ICD-10-CM | POA: Diagnosis not present

## 2019-08-11 DIAGNOSIS — Z9011 Acquired absence of right breast and nipple: Secondary | ICD-10-CM | POA: Insufficient documentation

## 2019-08-11 DIAGNOSIS — Z170 Estrogen receptor positive status [ER+]: Secondary | ICD-10-CM | POA: Diagnosis not present

## 2019-08-11 DIAGNOSIS — Z87891 Personal history of nicotine dependence: Secondary | ICD-10-CM | POA: Insufficient documentation

## 2019-08-11 DIAGNOSIS — C50911 Malignant neoplasm of unspecified site of right female breast: Secondary | ICD-10-CM | POA: Diagnosis not present

## 2019-08-11 NOTE — Telephone Encounter (Signed)
Per 2/17 los f/u open.

## 2019-08-23 ENCOUNTER — Telehealth: Payer: Self-pay | Admitting: Family Medicine

## 2019-08-23 ENCOUNTER — Other Ambulatory Visit: Payer: Self-pay | Admitting: Family Medicine

## 2019-08-23 NOTE — Telephone Encounter (Signed)
PATIENT CALLING TO SEE IF SHE CAN GET SOMETHING CALLED IN FOR HER GOUT PAIN SHE IS HAVING  [facility]

## 2019-08-24 ENCOUNTER — Other Ambulatory Visit: Payer: Self-pay | Admitting: Family Medicine

## 2019-08-24 MED ORDER — PREDNISONE 20 MG PO TABS: ORAL_TABLET | ORAL | 0 refills | Status: DC

## 2019-08-24 NOTE — Telephone Encounter (Signed)
Patient aware.

## 2019-08-24 NOTE — Telephone Encounter (Signed)
I called out prednisone.

## 2019-08-31 ENCOUNTER — Ambulatory Visit (INDEPENDENT_AMBULATORY_CARE_PROVIDER_SITE_OTHER): Payer: PPO | Admitting: Plastic Surgery

## 2019-08-31 ENCOUNTER — Encounter: Payer: Self-pay | Admitting: Plastic Surgery

## 2019-08-31 ENCOUNTER — Other Ambulatory Visit: Payer: Self-pay

## 2019-08-31 VITALS — BP 152/90 | HR 59 | Temp 98.5°F | Ht 63.0 in | Wt 195.0 lb

## 2019-08-31 DIAGNOSIS — D0511 Intraductal carcinoma in situ of right breast: Secondary | ICD-10-CM

## 2019-08-31 DIAGNOSIS — Z9011 Acquired absence of right breast and nipple: Secondary | ICD-10-CM | POA: Insufficient documentation

## 2019-08-31 NOTE — Progress Notes (Signed)
Patient ID: Adrienne [name], female    DOB: October 16, 1945, 74 y.o.   MRN: [id number]   Chief Complaint  Patient presents with  . Advice Only    intraductal carcinoma in situ of the right breast    The patient is a 74 year old white female here for evaluation of her breasts.  She underwent a right-sided mastectomy 07/26/19 for ductal carcinoma in situ by Dr. Marlou Starks.  Radiation is not planned according to the patient.  She does not like the way that her right breast tissue appears.  She is feeling depressed about her looks.  She has a very nice prosthetic in her bra.  She looks symmetric in close.  She has a fair bit of excess skin laterally and medially and quite a bit of fat in the axillary mid axillary area.  She is 5 feet 3 inches tall and weighs 195 pounds her bra size is 46D/DD.  She has had a hysterectomy.  She has a history of bowel obstruction, cataracts, congestive heart failure, hyperglycemia, hypertension and hypothyroidism.  Her incision is healing well and there is no sign of seroma or hematoma.   Review of Systems  Constitutional: Negative.   HENT: Negative.   Eyes: Negative.   Respiratory: Negative.   Cardiovascular: Negative.   Gastrointestinal: Negative.   Endocrine: Negative.   Genitourinary: Negative.   Musculoskeletal: Negative.   Neurological: Negative.   Hematological: Negative.     Past Medical History:  Diagnosis Date  . Ambulates with cane    straight cane  . Arthritis    knee, back  . CHF (congestive heart failure) (HCC)    EF 55-60%  pt. denies. Dr. did further test and could not confirm per pt.  . Ductal carcinoma in situ of breast   . Gout   . History of blood transfusion 1986   w/ Hysterectomy surgery  . Hyperlipidemia    diet controlled - no meds  . Hypertension   . Hypothyroidism   . Knee pain   . Pelvic kidney    lower right pelvic kidney   . Pre-diabetes   . Prediabetes    diet controlled - no meds  . SBO (small bowel obstruction)  ([place]) 10/2016   surgery   . SBO (small bowel obstruction) ([place]) 10/2016  . Shoulder pain   . Sleep apnea    Mild - no mask needed per sleep study  . Smoker    quit smoking 2018  . Thyroid disease   . Tinnitus    BOTH EARS    Past Surgical History:  Procedure Laterality Date  . ABDOMINAL HYSTERECTOMY  1986   COMPLETE  . APPENDECTOMY     pt states it was removed when gallbladder was removed.  Marland Kitchen BACK SURGERY     lower back  . BREAST LUMPECTOMY WITH RADIOACTIVE SEED LOCALIZATION Right 03/18/2019   Procedure: RIGHT BREAST LUMPECTOMY WITH RADIOACTIVE SEED LOCALIZATION;  Surgeon: Jovita Kussmaul, MD;  Location: [facility];  Service: General;  Laterality: Right;  . CHOLECYSTECTOMY    . COLONOSCOPY  02/15/2008   [name]   . ECTOPIC PREGNANCY SURGERY    . JOINT REPLACEMENT     Left hip total Dr. [name] 10-01-17  . polypectomy-oropharynx    . RE-EXCISION OF BREAST CANCER,SUPERIOR MARGINS Right 04/08/2019   Procedure: RE-EXCISION OF RIGHT BREAST ANTERIOR MARGINS;  Surgeon: Jovita Kussmaul, MD;  [facility];  Service: General;  Laterality: Right;  . right knee meniscus    .  SBO  10/2016   small bowel obstruction  . SIMPLE MASTECTOMY WITH AXILLARY SENTINEL NODE BIOPSY Right 07/26/2019   Procedure: RIGHT MASTECTOMY WITH SENTINEL NODE BIOPSY;  Surgeon: Jovita Kussmaul, MD;  Location: [facility];  Service: General;  Laterality: Right;  . TOTAL HIP ARTHROPLASTY Left 10/01/2017   Procedure: LEFT  TOTAL HIP ARTHROPLASTY ANTERIOR APPROACH;  Surgeon: [name], MD;  [facility];  Service: Orthopedics;  Laterality: Left;  . TOTAL HIP ARTHROPLASTY Right 03/11/2018   Procedure: RIGHT TOTAL HIP ARTHROPLASTY ANTERIOR APPROACH;  Surgeon: [name], MD;  [facility];  Service: Orthopedics;  Laterality: Right;  110min  . UPPER GASTROINTESTINAL ENDOSCOPY        Current Outpatient Medications:  .  amLODipine (NORVASC) 10 MG tablet, Take 1 tablet (10 mg total) by mouth daily.,  Disp: 90 tablet, Rfl: 2 .  carvedilol (COREG) 3.125 MG tablet, TAKE 1 TABLET BY MOUTH TWICE DAILY WITH A MEAL, Disp: 180 tablet, Rfl: 0 .  hydrochlorothiazide (HYDRODIURIL) 25 MG tablet, Take 1 tablet by mouth once daily, Disp: 90 tablet, Rfl: 0 .  levothyroxine (SYNTHROID) 75 MCG tablet, Take by mouth., Disp: , Rfl:  .  predniSONE (DELTASONE) 20 MG tablet, 3 tabs poqday 1-2, 2 tabs poqday 3-4, 1 tab poqday 5-6, Disp: 12 tablet, Rfl: 0   Objective:   Vitals:   08/31/19 1033  BP: (!) 152/90  Pulse: (!) 59  Temp: 98.5 F (36.9 C)  SpO2: 100%    Physical Exam Vitals and nursing note reviewed.  Constitutional:      Appearance: Normal appearance.  HENT:     Head: Normocephalic and atraumatic.  Cardiovascular:     Rate and Rhythm: Normal rate.     Pulses: Normal pulses.  Pulmonary:     Effort: Pulmonary effort is normal. No respiratory distress.  Abdominal:     General: Abdomen is flat. There is no distension.     Tenderness: There is no abdominal tenderness.  Skin:    General: Skin is warm.     Capillary Refill: Capillary refill takes less than 2 seconds.  Neurological:     General: No focal deficit present.     Mental Status: She is alert.  Psychiatric:        Mood and Affect: Mood normal.        Behavior: Behavior normal.     Assessment & Plan:  Ductal carcinoma in situ (DCIS) of right breast  Acquired absence of right breast  We discussed some options for breast reconstruction.  Due to the patient's age I do not think autologous reconstruction is a good idea so we did not going to any great detail about those options.  She does have an option for implant based reconstruction.  She seemed to have a challenging time comprehending the process.  I think at this point the safest would be removal of the excess fat and skin of the right breast.  A mastopexy reduction on the left would be good but she does not want to have surgery on that side.  This is understandable.  She is  going to think about her options over the next few months and let the swelling go down from the previous surgery.  We will have her back and discuss it more in the office.  Pictures were obtained of the patient and placed in the chart with the patient's or guardian's permission.  Wallace Going, DO   The 21st Century Cures Act  was signed into law in 2016 which includes the topic of electronic health records.  This provides immediate access to information in MyChart.  This includes consultation notes, operative notes, office notes, lab results and pathology reports.  If you have any questions about what you read please let us know at your next visit or call us at the office.  We are right here with you.

## 2019-09-08 ENCOUNTER — Ambulatory Visit: Payer: PPO | Attending: Internal Medicine

## 2019-09-08 ENCOUNTER — Ambulatory Visit: Payer: PPO

## 2019-09-08 DIAGNOSIS — Z23 Encounter for immunization: Secondary | ICD-10-CM

## 2019-09-08 NOTE — Progress Notes (Signed)
   Covid-19 Vaccination Clinic  Name:  Adrienne [name]    MRN: [id number] DOB: February 11, 1946  09/08/2019  [name] was observed post Covid-19 immunization for 15 minutes without incident. She was provided with Vaccine Information Sheet and instruction to access the V-Safe system.   [name] was instructed to call 911 with any severe reactions post vaccine: Marland Kitchen Difficulty breathing  . Swelling of face and throat  . A fast heartbeat  . A bad rash all over body  . Dizziness and weakness   Immunizations Administered    Name Date Dose VIS Date Route   Moderna COVID-19 Vaccine 09/08/2019 12:14 PM 0.5 mL 05/25/2019 Intramuscular   Manufacturer: Moderna   Lot: [id number]   [id number]

## 2019-09-13 ENCOUNTER — Other Ambulatory Visit: Payer: Self-pay | Admitting: Family Medicine

## 2019-10-14 ENCOUNTER — Ambulatory Visit: Payer: PPO | Attending: Internal Medicine

## 2019-10-14 DIAGNOSIS — Z23 Encounter for immunization: Secondary | ICD-10-CM

## 2019-10-14 NOTE — Progress Notes (Signed)
   Covid-19 Vaccination Clinic  Name:  Adrienne [name]    MRN: [id number] DOB: 01/30/1946  10/14/2019  Adrienne [name] was observed post Covid-19 immunization for 15 minutes without incident. She was provided with Vaccine Information Sheet and instruction to access the V-Safe system.   Adrienne [name] was instructed to call 911 with any severe reactions post vaccine: Marland Kitchen Difficulty breathing  . Swelling of face and throat  . A fast heartbeat  . A bad rash all over body  . Dizziness and weakness   Immunizations Administered    Name Date Dose VIS Date Route   Moderna COVID-19 Vaccine 10/14/2019 10:48 AM 0.5 mL 05/2019 Intramuscular   Manufacturer: Levan Hurst   Lot: [id number]   [id number]

## 2019-10-22 ENCOUNTER — Other Ambulatory Visit: Payer: Self-pay | Admitting: Family Medicine

## 2019-10-26 ENCOUNTER — Other Ambulatory Visit: Payer: Self-pay | Admitting: Family Medicine

## 2019-10-28 ENCOUNTER — Ambulatory Visit: Payer: PPO

## 2019-11-01 ENCOUNTER — Telehealth: Payer: Self-pay | Admitting: Family Medicine

## 2019-11-01 NOTE — Chronic Care Management (AMB) (Signed)
°  Chronic Care Management   Outreach Note  11/01/2019 Name: Adrienne [name] MRN: [id number] DOB: 11-11-1945  Referred by: Susy Frizzle, MD Reason for referral : Chronic Care Management   An unsuccessful telephone outreach was attempted today. The patient was referred to the pharmacist for assistance with care management and care coordination.   Follow Up Plan:   [place]

## 2019-11-09 ENCOUNTER — Ambulatory Visit: Payer: PPO | Admitting: Plastic Surgery

## 2019-11-17 ENCOUNTER — Telehealth: Payer: Self-pay | Admitting: Family Medicine

## 2019-11-17 ENCOUNTER — Other Ambulatory Visit: Payer: Self-pay

## 2019-11-17 NOTE — Telephone Encounter (Signed)
#  CB Refill prednisone

## 2019-11-17 NOTE — Telephone Encounter (Signed)
Requested Prescriptions   Pending Prescriptions Disp Refills  . predniSONE (DELTASONE) 20 MG tablet 12 tablet 0    Sig: 3 tabs poqday 1-2, 2 tabs poqday 3-4, 1 tab poqday 5-6    Last OV 04/23/2019    Last written 08/24/2019  Pt called and stated that she is having another gout flare up and would like a Rx.

## 2019-11-18 NOTE — Telephone Encounter (Signed)
Patient requires OV for refill on Pred Taper

## 2019-11-19 ENCOUNTER — Ambulatory Visit: Payer: PPO | Admitting: Family Medicine

## 2019-11-19 ENCOUNTER — Other Ambulatory Visit: Payer: Self-pay

## 2019-11-23 ENCOUNTER — Ambulatory Visit (INDEPENDENT_AMBULATORY_CARE_PROVIDER_SITE_OTHER): Payer: PPO | Admitting: Family Medicine

## 2019-11-23 ENCOUNTER — Other Ambulatory Visit: Payer: Self-pay

## 2019-11-23 VITALS — BP 138/98 | HR 55 | Temp 97.4°F | Ht 63.0 in | Wt 198.0 lb

## 2019-11-23 DIAGNOSIS — M1712 Unilateral primary osteoarthritis, left knee: Secondary | ICD-10-CM

## 2019-11-23 DIAGNOSIS — M109 Gout, unspecified: Secondary | ICD-10-CM

## 2019-11-23 MED ORDER — PREDNISONE 20 MG PO TABS: ORAL_TABLET | ORAL | 0 refills | Status: DC

## 2019-11-23 MED ORDER — LOSARTAN POTASSIUM 50 MG PO TABS: 50.0000 mg | ORAL_TABLET | Freq: Every day | ORAL | 3 refills | Status: DC

## 2019-11-23 NOTE — Progress Notes (Signed)
Subjective:    Patient ID: Adrienne [name], female    DOB: March 17, 1946, 74 y.o.   MRN: [id number]  HPI Patient was diagnosed with gout last year in October.  She has had 2 previous flares.  She developed severe pain in her right first MTP joint last week.  It hurts for her to walk.  It hurts of the sheet to touch her toe.  The joint is swollen tender and red.  Of note she is on hydrochlorothiazide.  She also would like to see an orthopedic surgeon to discuss treatment options for her left knee as the cortisone injections that we have provided have given her very little relief. Past Medical History:  Diagnosis Date  . Ambulates with cane    straight cane  . Arthritis    knee, back  . CHF (congestive heart failure) (HCC)    EF 55-60%  pt. denies. Dr. did further test and could not confirm per pt.  . Ductal carcinoma in situ of breast   . Gout   . History of blood transfusion 1986   w/ Hysterectomy surgery  . Hyperlipidemia    diet controlled - no meds  . Hypertension   . Hypothyroidism   . Knee pain   . Pelvic kidney    lower right pelvic kidney   . Pre-diabetes   . Prediabetes    diet controlled - no meds  . SBO (small bowel obstruction) ([place]) 10/2016   surgery   . SBO (small bowel obstruction) ([place]) 10/2016  . Shoulder pain   . Sleep apnea    Mild - no mask needed per sleep study  . Smoker    quit smoking 2018  . Thyroid disease   . Tinnitus    BOTH EARS   Past Surgical History:  Procedure Laterality Date  . ABDOMINAL HYSTERECTOMY  1986   COMPLETE  . APPENDECTOMY     pt states it was removed when gallbladder was removed.  Marland Kitchen BACK SURGERY     lower back  . BREAST LUMPECTOMY WITH RADIOACTIVE SEED LOCALIZATION Right 03/18/2019   Procedure: RIGHT BREAST LUMPECTOMY WITH RADIOACTIVE SEED LOCALIZATION;  Surgeon: Jovita Kussmaul, MD;  Location: [place];  Service: General;  Laterality: Right;  . CHOLECYSTECTOMY    . COLONOSCOPY  02/15/2008   [name]   . ECTOPIC PREGNANCY  SURGERY    . JOINT REPLACEMENT     Left hip total Dr. [name] 10-01-17  . polypectomy-oropharynx    . RE-EXCISION OF BREAST CANCER,SUPERIOR MARGINS Right 04/08/2019   Procedure: RE-EXCISION OF RIGHT BREAST ANTERIOR MARGINS;  Surgeon: Jovita Kussmaul, MD;  [facility];  Service: General;  Laterality: Right;  . right knee meniscus    . SBO  10/2016   small bowel obstruction  . SIMPLE MASTECTOMY WITH AXILLARY SENTINEL NODE BIOPSY Right 07/26/2019   Procedure: RIGHT MASTECTOMY WITH SENTINEL NODE BIOPSY;  Surgeon: Jovita Kussmaul, MD;  Location: [place];  Service: General;  Laterality: Right;  . TOTAL HIP ARTHROPLASTY Left 10/01/2017   Procedure: LEFT  TOTAL HIP ARTHROPLASTY ANTERIOR APPROACH;  Surgeon: [name], MD;  [facility];  Service: Orthopedics;  Laterality: Left;  . TOTAL HIP ARTHROPLASTY Right 03/11/2018   Procedure: RIGHT TOTAL HIP ARTHROPLASTY ANTERIOR APPROACH;  Surgeon: [name], MD;  [facility];  Service: Orthopedics;  Laterality: Right;  12min  . UPPER GASTROINTESTINAL ENDOSCOPY     Current Outpatient Medications on File Prior to Visit  Medication Sig Dispense  Refill  . amLODipine (NORVASC) 10 MG tablet Take 1 tablet by mouth once daily 90 tablet 0  . carvedilol (COREG) 3.125 MG tablet TAKE 1 TABLET BY MOUTH TWICE DAILY WITH A MEAL 180 tablet 0  . hydrochlorothiazide (HYDRODIURIL) 25 MG tablet Take 1 tablet by mouth once daily 90 tablet 0  . levothyroxine (SYNTHROID) 75 MCG tablet Take 1 tablet by mouth once daily 90 tablet 0   No current facility-administered medications on file prior to visit.   Allergies  Allergen Reactions  . Lisinopril Swelling    Angioedema  . Atorvastatin Other (See Comments)    Muscle aches     Social History   Socioeconomic History  . Marital status: Widowed    Spouse name: Not on file  . Number of children: Not on file  . Years of education: Not on file  . Highest education level: Not on file    Occupational History  . Occupation: retired  Tobacco Use  . Smoking status: Former Smoker    Packs/day: 1.00    Years: 32.00    Pack years: 32.00    Types: E-cigarettes, Cigarettes    Quit date: 11/20/2016    Years since quitting: 3.0  . Smokeless tobacco: Never Used  . Tobacco comment: Smoker since 1975 has quit and started back several times!!,  Substance and Sexual Activity  . Alcohol use: No  . Drug use: No  . Sexual activity: Not Currently    Birth control/protection: Surgical    Comment: Hysterectomy  Other Topics Concern  . Not on file  Social History Narrative  . Not on file   Social Determinants of Health   Financial Resource Strain:   . Difficulty of Paying Living Expenses:   Food Insecurity:   . Worried About Charity fundraiser in the Last Year:   . Arboriculturist in the Last Year:   Transportation Needs:   . Film/video editor (Medical):   Marland Kitchen Lack of Transportation (Non-Medical):   Physical Activity:   . Days of Exercise per Week:   . Minutes of Exercise per Session:   Stress:   . Feeling of Stress :   Social Connections:   . Frequency of Communication with Friends and Family:   . Frequency of Social Gatherings with Friends and Family:   . Attends Religious Services:   . Active Member of Clubs or Organizations:   . Attends Archivist Meetings:   Marland Kitchen Marital Status:   Intimate Partner Violence:   . Fear of Current or Ex-Partner:   . Emotionally Abused:   Marland Kitchen Physically Abused:   . Sexually Abused:       Review of Systems  All other systems reviewed and are negative.      Objective:   Physical Exam Vitals reviewed.  Constitutional:      General: She is not in acute distress.    Appearance: She is well-developed. She is not diaphoretic.  Neck:     Thyroid: No thyromegaly.  Cardiovascular:     Rate and Rhythm: Normal rate and regular rhythm.     Heart sounds: Normal heart sounds.  Pulmonary:     Effort: Pulmonary effort is  normal.     Breath sounds: Normal breath sounds.  Musculoskeletal:     Cervical back: Normal range of motion.     Right foot: Swelling, tenderness and bony tenderness present.     Left foot: Swelling, tenderness and bony tenderness present.  Feet:  Neurological:     Mental Status: She is alert.           Assessment & Plan:  Primary osteoarthritis of left knee - Plan: Ambulatory referral to Orthopedic Surgery  Podagra  I will refer the patient to an orthopedic surgeon to discuss further treatment options for her osteoarthritis in her left knee.  I will treat the patient's podagra in her right first MTP joint with a prednisone taper pack.  I recommended discontinuation of hydrochlorothiazide given the fact that this is her third gout attack.  We will replace it with losartan 50 mg a day.  Given her previous history of angioedema on lisinopril we will monitor for angioedema.  I also recommended that we start allopurinol to prevent gout flares.  Patient is hesitant to start any medication at the present time but will consider it.

## 2019-12-01 ENCOUNTER — Telehealth: Payer: Self-pay | Admitting: Family Medicine

## 2019-12-01 NOTE — Progress Notes (Signed)
  Chronic Care Management   Outreach Note  12/01/2019 Name: Adrienne [name] MRN: [id number] DOB: [birth year]  Referred by: Susy Frizzle, MD Reason for referral : No chief complaint on file.   A second unsuccessful telephone outreach was attempted today. The patient was referred to pharmacist for assistance with care management and care coordination.  Follow Up Plan:   [place]

## 2019-12-14 ENCOUNTER — Telehealth: Payer: Self-pay

## 2019-12-14 NOTE — Telephone Encounter (Signed)
Adrienne [name] toe is still sore and swollen, I asked her was the toe soreness tolerable , she said yes, wanted to know if another course of prednisone could be called in?

## 2019-12-16 ENCOUNTER — Other Ambulatory Visit: Payer: Self-pay | Admitting: Family Medicine

## 2019-12-16 MED ORDER — PREDNISONE 20 MG PO TABS: ORAL_TABLET | ORAL | 0 refills | Status: DC

## 2019-12-16 NOTE — Telephone Encounter (Signed)
I will send in prednisone.

## 2019-12-16 NOTE — Telephone Encounter (Signed)
Pt is fine with steroid being sent in

## 2019-12-17 ENCOUNTER — Telehealth: Payer: Self-pay | Admitting: Family Medicine

## 2019-12-17 NOTE — Progress Notes (Signed)
  Chronic Care Management   Outreach Note  12/17/2019 Name: Adrienne [name] MRN: [id number] DOB: 02-04-1946  Referred by: Susy Frizzle, MD Reason for referral : No chief complaint on file.   Third unsuccessful telephone outreach was attempted today. The patient was referred to the pharmacist for assistance with care management and care coordination.   Follow Up Plan:   [place]

## 2019-12-17 NOTE — Progress Notes (Signed)
  Chronic Care Management   Note  12/17/2019 Name: Adrienne [name] MRN: [id number] DOB: [birth year]  Adrienne [name] is a 74 y.o. year old female who is a primary care patient of Susy Frizzle, MD. I reached out to [place] by phone today in response to a referral sent by [name]'s PCP, Susy Frizzle, MD.   [name] was given information about Chronic Care Management services today including:  1. CCM service includes personalized support from designated clinical staff supervised by her physician, including individualized plan of care and coordination with other care providers 2. 24/7 contact phone numbers for assistance for urgent and routine care needs. 3. Service will only be billed when office clinical staff spend 20 minutes or more in a month to coordinate care. 4. Only one practitioner may furnish and bill the service in a calendar month. 5. The patient may stop CCM services at any time (effective at the end of the month) by phone call to the office staff.   Patient agreed to services and verbal consent obtained.   Follow up plan:   [place]

## 2019-12-24 ENCOUNTER — Other Ambulatory Visit: Payer: Self-pay | Admitting: Family Medicine

## 2019-12-29 ENCOUNTER — Other Ambulatory Visit: Payer: Self-pay | Admitting: Family Medicine

## 2020-01-06 ENCOUNTER — Telehealth: Payer: Self-pay | Admitting: Family Medicine

## 2020-01-06 NOTE — Telephone Encounter (Signed)
CB# [phone] Pt would like for Dr.Pickard start refilling her Euthyrox for 90 day supply it was 90 days but it was fill for 30 days pharmacy told her to reach to the doctor to update

## 2020-01-07 ENCOUNTER — Other Ambulatory Visit: Payer: Self-pay

## 2020-01-07 MED ORDER — LEVOTHYROXINE SODIUM 75 MCG PO TABS: 75.0000 ug | ORAL_TABLET | Freq: Every day | ORAL | 1 refills | Status: DC

## 2020-01-07 NOTE — Telephone Encounter (Signed)
Pt rx refill is for 90 days

## 2020-01-13 ENCOUNTER — Other Ambulatory Visit: Payer: Self-pay

## 2020-01-13 ENCOUNTER — Ambulatory Visit: Payer: PPO

## 2020-01-13 ENCOUNTER — Other Ambulatory Visit: Payer: PPO

## 2020-01-13 DIAGNOSIS — M25562 Pain in left knee: Secondary | ICD-10-CM | POA: Diagnosis not present

## 2020-01-13 DIAGNOSIS — M1712 Unilateral primary osteoarthritis, left knee: Secondary | ICD-10-CM | POA: Diagnosis not present

## 2020-02-02 ENCOUNTER — Other Ambulatory Visit (HOSPITAL_COMMUNITY): Payer: Self-pay | Admitting: General Surgery

## 2020-02-02 DIAGNOSIS — Z1231 Encounter for screening mammogram for malignant neoplasm of breast: Secondary | ICD-10-CM

## 2020-02-03 ENCOUNTER — Other Ambulatory Visit: Payer: Self-pay

## 2020-02-03 ENCOUNTER — Encounter (INDEPENDENT_AMBULATORY_CARE_PROVIDER_SITE_OTHER): Payer: Self-pay | Admitting: Internal Medicine

## 2020-02-03 ENCOUNTER — Ambulatory Visit (INDEPENDENT_AMBULATORY_CARE_PROVIDER_SITE_OTHER): Payer: PPO | Admitting: Internal Medicine

## 2020-02-03 VITALS — BP 126/71 | HR 51 | Temp 98.0°F | Resp 18 | Ht 63.0 in | Wt 199.4 lb

## 2020-02-03 DIAGNOSIS — E785 Hyperlipidemia, unspecified: Secondary | ICD-10-CM

## 2020-02-03 DIAGNOSIS — E039 Hypothyroidism, unspecified: Secondary | ICD-10-CM | POA: Diagnosis not present

## 2020-02-03 DIAGNOSIS — I10 Essential (primary) hypertension: Secondary | ICD-10-CM | POA: Diagnosis not present

## 2020-02-03 DIAGNOSIS — E119 Type 2 diabetes mellitus without complications: Secondary | ICD-10-CM

## 2020-02-03 DIAGNOSIS — E559 Vitamin D deficiency, unspecified: Secondary | ICD-10-CM | POA: Diagnosis not present

## 2020-02-03 NOTE — Progress Notes (Signed)
Metrics: Intervention Frequency ACO  Documented Smoking Status Yearly  Screened one or more times in 24 months  Cessation Counseling or  Active cessation medication Past 24 months  Past 24 months   Guideline developer: UpToDate (See UpToDate for funding source) Date Released: 2014       Wellness Office Visit  Subjective:  Patient ID: Adrienne [name], female    DOB: Nov 15, 1945  Age: 74 y.o. MRN: [id number]  CC: This 74 year old lady comes to our practice to establish care. HPI  It appears that she had a hemoglobin A1c of 6.6% in October last year and this clearly makes her diabetic.  She was not aware of this. She also suffers from hypertension, previous history of congestive heart failure although her ejection fraction on the last time it was checked was normal. She has had breast cancer and was treated with mastectomy only. She also has hypothyroidism and takes levothyroxine. She wishes to become healthier, lose weight, feel more energized. Past Medical History:  Diagnosis Date  . Ambulates with cane    straight cane  . Arthritis    knee, back  . CHF (congestive heart failure) (HCC)    EF 55-60%  pt. denies. Dr. did further test and could not confirm per pt.  . 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 Gout   . History of blood transfusion 1986   w/ Hysterectomy surgery  . Hyperlipidemia    diet controlled - no meds  . Hypertension   . Hypothyroidism   . Knee pain   . Pelvic kidney    lower right pelvic kidney   . Pre-diabetes   . Prediabetes    diet controlled - no meds  . SBO (small bowel obstruction) ([place]) 10/2016   surgery   . SBO (small bowel obstruction) ([place]) 10/2016  . Shoulder pain   . Sleep apnea    Mild - no mask needed per sleep study  . Smoker    quit smoking 2018  . Thyroid disease   . Tinnitus    BOTH EARS   Past Surgical History:  Procedure Laterality Date  . ABDOMINAL HYSTERECTOMY  1986    COMPLETE-precancerous  . APPENDECTOMY     pt states it was removed when gallbladder was removed.  Marland Kitchen BACK SURGERY     lower back  . BREAST LUMPECTOMY WITH RADIOACTIVE SEED LOCALIZATION Right 03/18/2019   Procedure: RIGHT BREAST LUMPECTOMY WITH RADIOACTIVE SEED LOCALIZATION;  Surgeon: Jovita Kussmaul, MD;  Location: [facility];  Service: General;  Laterality: Right;  . CHOLECYSTECTOMY    . COLONOSCOPY  02/15/2008   [name]   . ECTOPIC PREGNANCY SURGERY    . JOINT REPLACEMENT     Left hip total Dr. [name] 10-01-17  . polypectomy-oropharynx    . RE-EXCISION OF BREAST CANCER,SUPERIOR MARGINS Right 04/08/2019   Procedure: RE-EXCISION OF RIGHT BREAST ANTERIOR MARGINS;  Surgeon: Jovita Kussmaul, MD;  [facility];  Service: General;  Laterality: Right;  . right knee meniscus    . SBO  10/2016   small bowel obstruction  . SIMPLE MASTECTOMY WITH AXILLARY SENTINEL NODE BIOPSY Right 07/26/2019   Procedure: RIGHT MASTECTOMY WITH SENTINEL NODE BIOPSY;  Surgeon: Jovita Kussmaul, MD;  Location: [place];  Service: General;  Laterality: Right;  . TOTAL HIP ARTHROPLASTY Left 10/01/2017   Procedure: LEFT  TOTAL HIP ARTHROPLASTY ANTERIOR APPROACH;  Surgeon: [name], MD;  [facility];  Service: Orthopedics;  Laterality: Left;  . TOTAL HIP ARTHROPLASTY Right 03/11/2018   Procedure: RIGHT TOTAL HIP ARTHROPLASTY ANTERIOR APPROACH;  Surgeon: [name], MD;  [facility];  Service: Orthopedics;  Laterality: Right;  151min  . UPPER GASTROINTESTINAL ENDOSCOPY       Family History  Problem Relation Age of Onset  . Cancer Sister   . Diabetes Brother   . Diabetes Brother   . Hypertension Other   . Breast cancer Sister   . Colon cancer Neg Hx   . Colon polyps Neg Hx   . Rectal cancer Neg Hx   . Stomach cancer Neg Hx     Social History   Social History Narrative   Widow since 2006.Married previously for 25 years.Lives with grandaughter and 2 great  grandchildren.Retired.Previously office work.   Social History   Tobacco Use  . Smoking status: Former Smoker    Packs/day: 1.00    Years: 32.00    Pack years: 32.00    Types: E-cigarettes, Cigarettes    Quit date: 11/20/2016    Years since quitting: 3.2  . Smokeless tobacco: Never Used  . Tobacco comment: Smoker since 1975 has quit and started back several times!!,  Substance Use Topics  . Alcohol use: No    Current Meds  Medication Sig  . amLODipine (NORVASC) 10 MG tablet Take 1 tablet by mouth once daily  . carvedilol (COREG) 3.125 MG tablet TAKE 1 TABLET BY MOUTH TWICE DAILY WITH A MEAL  . levothyroxine (EUTHYROX) 75 MCG tablet Take 1 tablet (75 mcg total) by mouth daily.  Marland Kitchen losartan (COZAAR) 50 MG tablet Take 1 tablet (50 mg total) by mouth daily. Replacing HCTZ       Depression screen [facility] 2/9 08/05/2017 06/05/2017 04/22/2017 10/28/2016 12/06/2015  Decreased Interest 0 1 1 0 0  Down, Depressed, Hopeless 0 0 1 0 0  PHQ - 2 Score 0 1 2 0 0  Altered sleeping - 1 1 0 -  Tired, decreased energy - 1 2 0 -  Change in appetite - 0 1 0 -  Feeling bad or failure about yourself  - 1 0 0 -  Trouble concentrating - 0 1 0 -  Moving slowly or fidgety/restless - 0 0 0 -  Suicidal thoughts - 0 0 0 -  PHQ-9 Score - 4 7 0 -  Difficult doing work/chores - Not difficult at all Not difficult at all Not difficult at all -     Objective:   Today's Vitals: BP 126/71 (BP Location: Right Arm, Patient Position: Sitting, Cuff Size: Normal)   Pulse (!) 51   Temp 98 F (36.7 C) (Temporal)   Resp 18   Ht 5\' 3"  (1.6 m)   Wt 199 lb 6.4 oz (90.4 kg)   SpO2 98%   BMI 35.32 kg/m  Vitals with BMI 02/03/2020 11/23/2019 08/31/2019  Height 5\' 3"  5\' 3"  5\' 3"   Weight 199 lbs 6 oz 198 lbs 195 lbs  BMI 35.33 70.62 37.62  Systolic 831 517 616  Diastolic 71 98 90  Pulse 51 55 59     Physical Exam  She is morbidly obese.  Blood pressure is well controlled.  She is alert and orientated without any  focal neurological signs.     Assessment   1. Diabetes mellitus without complication ([place])   2. Essential hypertension   3. Acquired hypothyroidism   4. Dyslipidemia   5. Vitamin D deficiency disease   6. Morbid obesity ([place])  Tests ordered Orders Placed This Encounter  Procedures  . COMPLETE METABOLIC PANEL WITH GFR  . CBC  . Hemoglobin A1c  . Lipid panel  . T3, free  . T4  . TSH  . VITAMIN D 25 Hydroxy (Vit-D Deficiency, Fractures)     Plan: 1. Blood work is ordered. 2. She will continue with all antihypertensive medications that are listed above as this seems to be controlling her blood pressure. 3. She will continue with levothyroxine for the time being for her hypothyroidism although this is not going to be optimal and we will check thyroid function test fully. 4. Today I told her that she needs to drink about 100 ounces of water a day, she should throw away the Kool-Aid that she has at home.  She eats animal protein on most days of the week and I have told her to cut this in half.  Instead, she should replace the animal protein with beings. 5. I will see her in the next several weeks to review all her blood results and give her further recommendations.   No orders of the defined types were placed in this encounter.   Doree Albee, MD

## 2020-02-04 LAB — LIPID PANEL
Cholesterol: 279 mg/dL — ABNORMAL HIGH (ref <200)
HDL: 57 mg/dL (ref >=50)
LDL Cholesterol (Calc): 182 mg/dL (calc) — ABNORMAL HIGH
Non-HDL Cholesterol (Calc): 222 mg/dL (calc) — ABNORMAL HIGH (ref <130)
Total CHOL/HDL Ratio: 4.9 (calc) (ref <5.0)
Triglycerides: 212 mg/dL — ABNORMAL HIGH (ref <150)

## 2020-02-04 LAB — COMPLETE METABOLIC PANEL WITH GFR
AG Ratio: 1.3 (calc) (ref 1.0–2.5)
ALT: 13 U/L (ref 6–29)
AST: 19 U/L (ref 10–35)
Albumin: 3.9 g/dL (ref 3.6–5.1)
Alkaline phosphatase (APISO): 66 U/L (ref 37–153)
BUN: 14 mg/dL (ref 7–25)
CO2: 27 mmol/L (ref 20–32)
Calcium: 9.2 mg/dL (ref 8.6–10.4)
Chloride: 105 mmol/L (ref 98–110)
Creat: 0.92 mg/dL (ref 0.60–0.93)
GFR, Est African American: 72 mL/min/{1.73_m2} (ref >=60)
GFR, Est Non African American: 62 mL/min/{1.73_m2} (ref >=60)
Globulin: 2.9 g/dL (calc) (ref 1.9–3.7)
Glucose, Bld: 108 mg/dL — ABNORMAL HIGH (ref 65–99)
Potassium: 4.2 mmol/L (ref 3.5–5.3)
Sodium: 141 mmol/L (ref 135–146)
Total Bilirubin: 0.5 mg/dL (ref 0.2–1.2)
Total Protein: 6.8 g/dL (ref 6.1–8.1)

## 2020-02-04 LAB — HEMOGLOBIN A1C
Hgb A1c MFr Bld: 6.4 % of total Hgb — ABNORMAL HIGH (ref <5.7)
Mean Plasma Glucose: 137 (calc)
eAG (mmol/L): 7.6 (calc)

## 2020-02-04 LAB — CBC
HCT: 40 % (ref 35.0–45.0)
Hemoglobin: 13.2 g/dL (ref 11.7–15.5)
MCH: 30.8 pg (ref 27.0–33.0)
MCHC: 33 g/dL (ref 32.0–36.0)
MCV: 93.2 fL (ref 80.0–100.0)
MPV: 11.7 fL (ref 7.5–12.5)
Platelets: 230 10*3/uL (ref 140–400)
RBC: 4.29 10*6/uL (ref 3.80–5.10)
RDW: 15.1 % — ABNORMAL HIGH (ref 11.0–15.0)
WBC: 8.4 10*3/uL (ref 3.8–10.8)

## 2020-02-04 LAB — VITAMIN D 25 HYDROXY (VIT D DEFICIENCY, FRACTURES): Vit D, 25-Hydroxy: 14 ng/mL — ABNORMAL LOW (ref 30–100)

## 2020-02-04 LAB — T3, FREE: T3, Free: 2.5 pg/mL (ref 2.3–4.2)

## 2020-02-04 LAB — TSH: TSH: 8.91 mIU/L — ABNORMAL HIGH (ref 0.40–4.50)

## 2020-02-04 LAB — T4: T4, Total: 5.1 ug/dL (ref 5.1–11.9)

## 2020-02-10 ENCOUNTER — Ambulatory Visit: Payer: PPO

## 2020-02-17 ENCOUNTER — Other Ambulatory Visit: Payer: Self-pay

## 2020-02-17 ENCOUNTER — Ambulatory Visit (HOSPITAL_COMMUNITY)
Admission: RE | Admit: 2020-02-17 | Discharge: 2020-02-17 | Disposition: A | Discharge status: Home / Self Care | Payer: PPO | Source: Ambulatory Visit | Attending: General Surgery | Admitting: General Surgery

## 2020-02-17 DIAGNOSIS — Z1231 Encounter for screening mammogram for malignant neoplasm of breast: Secondary | ICD-10-CM | POA: Diagnosis not present

## 2020-02-18 ENCOUNTER — Telehealth: Payer: Self-pay | Admitting: Cardiovascular Disease

## 2020-02-18 NOTE — Telephone Encounter (Signed)
OK 

## 2020-02-18 NOTE — Telephone Encounter (Signed)
Message sent to schedulers to arrange

## 2020-02-18 NOTE — Telephone Encounter (Signed)
Ok with me 

## 2020-02-18 NOTE — Telephone Encounter (Signed)
Patient would like to switch from Dr. Acie Fredrickson to Dr. Percival Spanish due to location. She would like to go to the [facility].

## 2020-03-28 ENCOUNTER — Ambulatory Visit (INDEPENDENT_AMBULATORY_CARE_PROVIDER_SITE_OTHER): Payer: PPO | Admitting: Internal Medicine

## 2020-04-03 ENCOUNTER — Encounter (INDEPENDENT_AMBULATORY_CARE_PROVIDER_SITE_OTHER): Payer: Self-pay | Admitting: Internal Medicine

## 2020-04-03 ENCOUNTER — Other Ambulatory Visit: Payer: Self-pay

## 2020-04-03 ENCOUNTER — Ambulatory Visit (INDEPENDENT_AMBULATORY_CARE_PROVIDER_SITE_OTHER): Payer: PPO | Admitting: Internal Medicine

## 2020-04-03 VITALS — BP 132/70 | HR 78 | Temp 97.1°F | Ht 63.0 in | Wt 203.0 lb

## 2020-04-03 DIAGNOSIS — I10 Essential (primary) hypertension: Secondary | ICD-10-CM | POA: Diagnosis not present

## 2020-04-03 DIAGNOSIS — E039 Hypothyroidism, unspecified: Secondary | ICD-10-CM | POA: Diagnosis not present

## 2020-04-03 DIAGNOSIS — E119 Type 2 diabetes mellitus without complications: Secondary | ICD-10-CM

## 2020-04-03 DIAGNOSIS — E559 Vitamin D deficiency, unspecified: Secondary | ICD-10-CM

## 2020-04-03 MED ORDER — NP THYROID 60 MG PO TABS: 60.0000 mg | ORAL_TABLET | Freq: Every day | ORAL | 3 refills | Status: DC

## 2020-04-03 NOTE — Progress Notes (Signed)
Metrics: Intervention Frequency ACO  Documented Smoking Status Yearly  Screened one or more times in 24 months  Cessation Counseling or  Active cessation medication Past 24 months  Past 24 months   Guideline developer: UpToDate (See UpToDate for funding source) Date Released: 2014       Wellness Office Visit  Subjective:  Patient ID: Adrienne [name], female    DOB: [birth year]  Age: 74 y.o. MRN: [id number]  CC: This lady comes in to discuss all her blood work and further recommendations. HPI She has significant vitamin D deficiency.  Her diabetes shows a hemoglobin A1c of 6.4%. Her hypothyroidism is not controlled and her TSH is elevated with a low T3 level.  She is using levothyroxine.   Past Medical History:  Diagnosis Date  . Ambulates with cane    straight cane  . Arthritis    knee, back  . CHF (congestive heart failure) (HCC)    EF 55-60%  pt. denies. Dr. did further test and could not confirm per pt.  . 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 Gout   . History of blood transfusion 1986   w/ Hysterectomy surgery  . Hyperlipidemia    diet controlled - no meds  . Hypertension   . Hypothyroidism   . Knee pain   . Pelvic kidney    lower right pelvic kidney   . Pre-diabetes   . Prediabetes    diet controlled - no meds  . SBO (small bowel obstruction) ([place]) 10/2016   surgery   . SBO (small bowel obstruction) ([place]) 10/2016  . Shoulder pain   . Sleep apnea    Mild - no mask needed per sleep study  . Smoker    quit smoking 2018  . Thyroid disease   . Tinnitus    BOTH EARS   Past Surgical History:  Procedure Laterality Date  . ABDOMINAL HYSTERECTOMY  1986   COMPLETE-precancerous  . APPENDECTOMY     pt states it was removed when gallbladder was removed.  Marland Kitchen BACK SURGERY     lower back  . BREAST LUMPECTOMY WITH RADIOACTIVE SEED LOCALIZATION Right 03/18/2019   Procedure: RIGHT BREAST LUMPECTOMY WITH RADIOACTIVE SEED  LOCALIZATION;  Surgeon: Jovita Kussmaul, MD;  Location: [place];  Service: General;  Laterality: Right;  . CHOLECYSTECTOMY    . COLONOSCOPY  02/15/2008   [name]   . ECTOPIC PREGNANCY SURGERY    . JOINT REPLACEMENT     Left hip total Dr. [name] 10-01-17  . polypectomy-oropharynx    . RE-EXCISION OF BREAST CANCER,SUPERIOR MARGINS Right 04/08/2019   Procedure: RE-EXCISION OF RIGHT BREAST ANTERIOR MARGINS;  Surgeon: Jovita Kussmaul, MD;  [facility];  Service: General;  Laterality: Right;  . right knee meniscus    . SBO  10/2016   small bowel obstruction  . SIMPLE MASTECTOMY WITH AXILLARY SENTINEL NODE BIOPSY Right 07/26/2019   Procedure: RIGHT MASTECTOMY WITH SENTINEL NODE BIOPSY;  Surgeon: Jovita Kussmaul, MD;  Location: [place];  Service: General;  Laterality: Right;  . TOTAL HIP ARTHROPLASTY Left 10/01/2017   Procedure: LEFT  TOTAL HIP ARTHROPLASTY ANTERIOR APPROACH;  Surgeon: [name], MD;  [facility];  Service: Orthopedics;  Laterality: Left;  . TOTAL HIP ARTHROPLASTY Right 03/11/2018   Procedure: RIGHT TOTAL HIP ARTHROPLASTY ANTERIOR APPROACH;  Surgeon: [name], MD;  [facility];  Service: Orthopedics;  Laterality: Right;  148min  . UPPER GASTROINTESTINAL ENDOSCOPY  Family History  Problem Relation Age of Onset  . Cancer Sister   . Diabetes Brother   . Diabetes Brother   . Hypertension Other   . Breast cancer Sister   . Colon cancer Neg Hx   . Colon polyps Neg Hx   . Rectal cancer Neg Hx   . Stomach cancer Neg Hx     Social History   Social History Narrative   Widow since 2006.Married previously for 25 years.Lives with grandaughter and 2 great grandchildren.Retired.Previously office work.   Social History   Tobacco Use  . Smoking status: Former Smoker    Packs/day: 1.00    Years: 32.00    Pack years: 32.00    Types: E-cigarettes, Cigarettes    Quit date: 11/20/2016    Years since quitting: 3.3  . Smokeless tobacco: Never  Used  . Tobacco comment: Smoker since 1975 has quit and started back several times!!,  Substance Use Topics  . Alcohol use: No    Current Meds  Medication Sig  . amLODipine (NORVASC) 10 MG tablet Take 1 tablet by mouth once daily  . carvedilol (COREG) 3.125 MG tablet TAKE 1 TABLET BY MOUTH TWICE DAILY WITH A MEAL  . levothyroxine (EUTHYROX) 75 MCG tablet Take 1 tablet (75 mcg total) by mouth daily.  Marland Kitchen losartan (COZAAR) 50 MG tablet Take 1 tablet (50 mg total) by mouth daily. Replacing HCTZ      Depression screen [facility] 2/9 04/03/2020 08/05/2017 06/05/2017 04/22/2017 10/28/2016  Decreased Interest 0 0 1 1 0  Down, Depressed, Hopeless 0 0 0 1 0  PHQ - 2 Score 0 0 1 2 0  Altered sleeping - - 1 1 0  Tired, decreased energy - - 1 2 0  Change in appetite - - 0 1 0  Feeling bad or failure about yourself  - - 1 0 0  Trouble concentrating - - 0 1 0  Moving slowly or fidgety/restless - - 0 0 0  Suicidal thoughts - - 0 0 0  PHQ-9 Score - - 4 7 0  Difficult doing work/chores - - Not difficult at all Not difficult at all Not difficult at all     Objective:   Today's Vitals: BP 132/70 (BP Location: Right Arm, Patient Position: Sitting, Cuff Size: Normal)   Pulse 78   Temp (!) 97.1 F (36.2 C) (Temporal)   Ht 5\' 3"  (1.6 m)   Wt 203 lb (92.1 kg)   SpO2 98%   BMI 35.96 kg/m  Vitals with BMI 04/03/2020 02/03/2020 11/23/2019  Height 5\' 3"  5\' 3"  5\' 3"   Weight 203 lbs 199 lbs 6 oz 198 lbs  BMI 35.97 50.93 26.71  Systolic 245 809 983  Diastolic 70 71 98  Pulse 78 51 55     Physical Exam   She looks systemically well.  Blood pressures well controlled on current therapy.  She has gained 4 pounds in weight.    Assessment   1. Acquired hypothyroidism   2. Essential hypertension   3. Diabetes mellitus without complication ([place])   4. Vitamin D deficiency disease       Tests ordered No orders of the defined types were placed in this encounter.    Plan: 1. I have decided to switch  her levothyroxine to desiccated NP thyroid.  She is agreeable to this.  I have sent a new prescription of NP thyroid 60 mg tablet, take 1 daily to [facility] in the town that she lives in.  I have explained possible side effects and how to deal with them. 2. She will continue with same doses of amlodipine, carvedilol and losartan for her hypertension.  Her blood pressure is well controlled on this. 3. In terms of her diabetes, she will try to work on the diet that we discussed briefly last time. 4. I have recommended that she take vitamin D3 10,000 units daily for her vitamin D deficiency. 5. I will see her in about 2 months time for follow-up and see how she is doing and we will check blood work then.   Meds ordered this encounter  Medications  . NP THYROID 60 MG tablet    Sig: Take 1 tablet (60 mg total) by mouth daily before breakfast.    Dispense:  30 tablet    Refill:  3    Manual Navarra Luther Parody, MD

## 2020-04-11 ENCOUNTER — Encounter: Payer: Self-pay | Admitting: Cardiology

## 2020-04-11 NOTE — Progress Notes (Deleted)
Cardiology Office Note   Date:  04/11/2020   ID:  Adrienne [name], Adrienne [name] [birth year], MRN [id number]  PCP:  Doree Albee, MD  Cardiologist:   Mertie Moores, MD Referring:  ***  No chief complaint on file.     History of Present Illness: Adrienne [name] is a 74 y.o. female who presents for follow up of systolic and diastolic HF with an EF of 44%at one point although in the most recent echo in 2015 I see an EF of 55 - 60%.  She was followed by Dr. Acie Fredrickson but lives in the [place] area so is switching to this clinic.  This has been diagnosed as non ischemic.   ***     Congestive Heart Failure  . Hypertension    Problem list: 1. Chronic systolic congestive heart failure-EF of around 44% in 2010.  Her EF has now normalized.  2. Hypertension 3. Dyslipidemia   Adrienne [name] is a middle-age female with a history of hypertension, dyslipidemia, palpitations. She also has a history of chest pain. A nuclear stress test from March of 2010 was negative for ischemia.  She has not had episodes of chest pain or shortness of breath.  Jan. 5, 2015:  Adrienne [name] is seen today after a ~ 3 year absence. She retired last year and tried to get life insurance. She was told that she has "CHF" . Her echo and myoview from 2010 showed mildly reduced EF . The echo showed EF of 45-50%. Myoview EF was 41%.   Her medication doses are slightly lower than when we last saw her 3 years ago. She has gradually reduced her dose because of chronic headaches. She has been seeing a Karis Juba, NP at [place].  She still smokes - 1/2 ppd currently. She avoids salt. Does not eat out much.   December 31, 2013:  Adrienne [name] is doing ok. BP is much better than previous visit. BP at her medical doctors office . She stopped taking the HCTZ and potassium when she ran out of her prescription. Her blood pressure is better despite the fact that she's not on those medications, . She  is planning on moving to [facility] within the next several months   August 25, 2014:    Adrienne [name] is a 74 y.o. female who presents for follow-up of her history of congestive heart failure, hyperlipidemia, and hypertension.  She is staying busy. No CP or dyspnea.  Takes all of her meds. No CP or dyspnea. Does not exercise   still smoking ,  Advised her to stop again   September 12, 2016:  No CP , no dyspnea.   Still smoking  - advised her to stop  Having trouble with her knees   December 05, 2017:  Adrienne [name] is seen today today for follow-up of her chronic systolic congestive heart failure, hypertension and hyperlipidemia. Quit smoking last year  Did not start the Rosuvastatin  Had hip replacement   Sept. 3, 2020 :  Adrienne [name] is seen today for follow-up of her chronic systolic congestive heart failure, hypertension, hyperlipidemia.  She quit smoking 2 years ago.  Was diagnosed with breast cancer 3 weeks ago . Right breast lumpectomy scheduled in several weeks.  Uses very little salt .     Past Medical History:  Diagnosis Date  . Ambulates with cane    straight cane  . Arthritis    knee, back  . CHF (congestive heart failure) ([place])  EF 55-60%  pt. denies. Dr. did further test and could not confirm per pt.  . 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 Gout   . History of blood transfusion 1986   w/ Hysterectomy surgery  . Hyperlipidemia    diet controlled - no meds  . Hypertension   . Hypothyroidism   . Knee pain   . Pelvic kidney    lower right pelvic kidney   . Pre-diabetes   . Prediabetes    diet controlled - no meds  . SBO (small bowel obstruction) ([place]) 10/2016   surgery   . SBO (small bowel obstruction) ([place]) 10/2016  . Shoulder pain   . Sleep apnea    Mild - no mask needed per sleep study  . Smoker    quit smoking 2018  . Thyroid disease   . Tinnitus    BOTH EARS    Past Surgical History:  Procedure Laterality Date  .  ABDOMINAL HYSTERECTOMY  1986   COMPLETE-precancerous  . APPENDECTOMY     pt states it was removed when gallbladder was removed.  Marland Kitchen BACK SURGERY     lower back  . BREAST LUMPECTOMY WITH RADIOACTIVE SEED LOCALIZATION Right 03/18/2019   Procedure: RIGHT BREAST LUMPECTOMY WITH RADIOACTIVE SEED LOCALIZATION;  Surgeon: Jovita Kussmaul, MD;  Location: [facility];  Service: General;  Laterality: Right;  . CHOLECYSTECTOMY    . COLONOSCOPY  02/15/2008   [name]   . ECTOPIC PREGNANCY SURGERY    . JOINT REPLACEMENT     Left hip total Dr. [name] 10-01-17  . polypectomy-oropharynx    . RE-EXCISION OF BREAST CANCER,SUPERIOR MARGINS Right 04/08/2019   Procedure: RE-EXCISION OF RIGHT BREAST ANTERIOR MARGINS;  Surgeon: Jovita Kussmaul, MD;  [facility];  Service: General;  Laterality: Right;  . right knee meniscus    . SBO  10/2016   small bowel obstruction  . SIMPLE MASTECTOMY WITH AXILLARY SENTINEL NODE BIOPSY Right 07/26/2019   Procedure: RIGHT MASTECTOMY WITH SENTINEL NODE BIOPSY;  Surgeon: Jovita Kussmaul, MD;  Location: [facility];  Service: General;  Laterality: Right;  . TOTAL HIP ARTHROPLASTY Left 10/01/2017   Procedure: LEFT  TOTAL HIP ARTHROPLASTY ANTERIOR APPROACH;  Surgeon: [name], MD;  [facility];  Service: Orthopedics;  Laterality: Left;  . TOTAL HIP ARTHROPLASTY Right 03/11/2018   Procedure: RIGHT TOTAL HIP ARTHROPLASTY ANTERIOR APPROACH;  Surgeon: [name], MD;  [facility];  Service: Orthopedics;  Laterality: Right;  171min  . UPPER GASTROINTESTINAL ENDOSCOPY       Current Outpatient Medications  Medication Sig Dispense Refill  . amLODipine (NORVASC) 10 MG tablet Take 1 tablet by mouth once daily 90 tablet 0  . carvedilol (COREG) 3.125 MG tablet TAKE 1 TABLET BY MOUTH TWICE DAILY WITH A MEAL 180 tablet 0  . levothyroxine (EUTHYROX) 75 MCG tablet Take 1 tablet (75 mcg total) by mouth daily. 90 tablet 1  . losartan (COZAAR) 50 MG tablet Take 1 tablet  (50 mg total) by mouth daily. Replacing HCTZ 90 tablet 3  . NP THYROID 60 MG tablet Take 1 tablet (60 mg total) by mouth daily before breakfast. 30 tablet 3   No current facility-administered medications for this visit.    Allergies:   Lisinopril and Atorvastatin     ROS:  Please see the history of present illness.   Otherwise, review of systems are positive for {NONE DEFAULTED:18576::"none"}.   All other systems are  reviewed and negative.    PHYSICAL EXAM: VS:  There were no vitals taken for this visit. , BMI There is no height or weight on file to calculate BMI. GENERAL:  Well appearing NECK:  No jugular venous distention, waveform within normal limits, carotid upstroke brisk and symmetric, no bruits, no thyromegaly LUNGS:  Clear to auscultation bilaterally CHEST:  Unremarkable HEART:  PMI not displaced or sustained,S1 and S2 within normal limits, no S3, no S4, no clicks, no rubs, *** murmurs ABD:  Flat, positive bowel sounds normal in frequency in pitch, no bruits, no rebound, no guarding, no midline pulsatile mass, no hepatomegaly, no splenomegaly EXT:  2 plus pulses throughout, no edema, no cyanosis no clubbing   EKG:  EKG {ACTION; IS/IS FHQ:[id number]} ordered today. The ekg ordered today demonstrates ***   Recent Labs: 02/03/2020: ALT 13; BUN 14; Creat 0.92; Hemoglobin 13.2; Platelets 230; Potassium 4.2; Sodium 141; TSH 8.91    Lipid Panel    Component Value Date/Time   CHOL 279 (H) 02/03/2020 0940   TRIG 212 (H) 02/03/2020 0940   HDL 57 02/03/2020 0940   CHOLHDL 4.9 02/03/2020 0940   VLDL 20 04/08/2016 0933   LDLCALC 182 (H) 02/03/2020 0940      Wt Readings from Last 3 Encounters:  04/03/20 203 lb (92.1 kg)  02/03/20 199 lb 6.4 oz (90.4 kg)  11/23/19 198 lb (89.8 kg)      Other studies Reviewed: Additional studies/ records that were reviewed today include: ***. Review of the above records demonstrates:  Please see elsewhere in the note.  ***   ASSESSMENT  AND PLAN:  Chronic systolic and diastolic HF:  ***  HTN:  ***    Dyslipidemia:   ***   Current medicines are reviewed at length with the patient today.  The patient {ACTIONS; HAS/DOES NOT HAVE:19233} concerns regarding medicines.  The following changes have been made:  {PLAN; NO CHANGE:13088:s}  Labs/ tests ordered today include: *** No orders of the defined types were placed in this encounter.    Disposition:   FU with ***    Signed, Minus Breeding, MD  04/11/2020 6:33 PM    [facility]

## 2020-04-12 ENCOUNTER — Ambulatory Visit: Payer: PPO | Admitting: Cardiology

## 2020-04-12 DIAGNOSIS — I5022 Chronic systolic (congestive) heart failure: Secondary | ICD-10-CM

## 2020-04-12 DIAGNOSIS — I10 Essential (primary) hypertension: Secondary | ICD-10-CM

## 2020-04-12 DIAGNOSIS — E785 Hyperlipidemia, unspecified: Secondary | ICD-10-CM

## 2020-05-03 DIAGNOSIS — D0511 Intraductal carcinoma in situ of right breast: Secondary | ICD-10-CM | POA: Diagnosis not present

## 2020-05-31 ENCOUNTER — Ambulatory Visit (INDEPENDENT_AMBULATORY_CARE_PROVIDER_SITE_OTHER): Payer: PPO | Admitting: Internal Medicine

## 2020-05-31 ENCOUNTER — Other Ambulatory Visit: Payer: Self-pay

## 2020-05-31 ENCOUNTER — Encounter (INDEPENDENT_AMBULATORY_CARE_PROVIDER_SITE_OTHER): Payer: Self-pay | Admitting: Internal Medicine

## 2020-05-31 VITALS — BP 120/70 | HR 67 | Temp 97.2°F | Resp 18 | Ht 63.0 in | Wt 197.4 lb

## 2020-05-31 DIAGNOSIS — E559 Vitamin D deficiency, unspecified: Secondary | ICD-10-CM | POA: Diagnosis not present

## 2020-05-31 DIAGNOSIS — E119 Type 2 diabetes mellitus without complications: Secondary | ICD-10-CM | POA: Diagnosis not present

## 2020-05-31 DIAGNOSIS — M25562 Pain in left knee: Secondary | ICD-10-CM

## 2020-05-31 DIAGNOSIS — E039 Hypothyroidism, unspecified: Secondary | ICD-10-CM

## 2020-05-31 DIAGNOSIS — M25462 Effusion, left knee: Secondary | ICD-10-CM | POA: Diagnosis not present

## 2020-05-31 DIAGNOSIS — I10 Essential (primary) hypertension: Secondary | ICD-10-CM | POA: Diagnosis not present

## 2020-05-31 NOTE — Progress Notes (Signed)
Metrics: Intervention Frequency ACO  Documented Smoking Status Yearly  Screened one or more times in 24 months  Cessation Counseling or  Active cessation medication Past 24 months  Past 24 months   Guideline developer: UpToDate (See UpToDate for funding source) Date Released: 2014       Wellness Office Visit  Subjective:  Patient ID: Adrienne [name], female    DOB: Mar 29, 1946  Age: 74 y.o. MRN: [id number]  CC: This lady comes in for follow-up of hypothyroidism, hypertension, vitamin D deficiency and diabetes. HPI  She has tolerated desiccated NP thyroid when we made the switch from levothyroxine without any problems.  She did not notice any side effects that I described to her that might happen. She continues with diet only for her diabetes and her last hemoglobin A1c was 6.4%. She has been taking vitamin D3 10,000 units daily for vitamin D deficiency. She continues on amlodipine and carvedilol for hypertension as well as losartan. Today she is describing left knee pain and swelling which she has had for several days.  She does have an orthopedic surgeon in [place] but would like to see someone locally. Past Medical History:  Diagnosis Date  . Ambulates with cane    straight cane  . Arthritis    knee, back  . CHF (congestive heart failure) ([place])   . 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 Gout   . History of blood transfusion 1986   w/ Hysterectomy surgery  . Hyperlipidemia    diet controlled - no meds  . Hypertension   . Hypothyroidism   . Pelvic kidney    lower right pelvic kidney   . Prediabetes    diet controlled - no meds  . SBO (small bowel obstruction) ([place]) 10/2016   surgery   . Sleep apnea    Mild - no mask needed per sleep study  . Smoker    quit smoking 2018  . Thyroid disease   . Tinnitus    BOTH EARS   Past Surgical History:  Procedure Laterality Date  . ABDOMINAL HYSTERECTOMY  1986   COMPLETE-precancerous   . APPENDECTOMY     pt states it was removed when gallbladder was removed.  Marland Kitchen BACK SURGERY     lower back  . BREAST LUMPECTOMY WITH RADIOACTIVE SEED LOCALIZATION Right 03/18/2019   Procedure: RIGHT BREAST LUMPECTOMY WITH RADIOACTIVE SEED LOCALIZATION;  Surgeon: Jovita Kussmaul, MD;  Location: [facility];  Service: General;  Laterality: Right;  . CHOLECYSTECTOMY    . COLONOSCOPY  02/15/2008   [name]   . ECTOPIC PREGNANCY SURGERY    . JOINT REPLACEMENT     Left hip total Dr. [name] 10-01-17  . polypectomy-oropharynx    . RE-EXCISION OF BREAST CANCER,SUPERIOR MARGINS Right 04/08/2019   Procedure: RE-EXCISION OF RIGHT BREAST ANTERIOR MARGINS;  Surgeon: Jovita Kussmaul, MD;  [facility];  Service: General;  Laterality: Right;  . right knee meniscus    . SBO  10/2016   small bowel obstruction  . SIMPLE MASTECTOMY WITH AXILLARY SENTINEL NODE BIOPSY Right 07/26/2019   Procedure: RIGHT MASTECTOMY WITH SENTINEL NODE BIOPSY;  Surgeon: Jovita Kussmaul, MD;  Location: [facility];  Service: General;  Laterality: Right;  . TOTAL HIP ARTHROPLASTY Left 10/01/2017   Procedure: LEFT  TOTAL HIP ARTHROPLASTY ANTERIOR APPROACH;  Surgeon: [name], MD;  [facility];  Service: Orthopedics;  Laterality: Left;  . TOTAL HIP ARTHROPLASTY Right 03/11/2018  Procedure: RIGHT TOTAL HIP ARTHROPLASTY ANTERIOR APPROACH;  Surgeon: [name], MD;  [facility];  Service: Orthopedics;  Laterality: Right;  168min  . UPPER GASTROINTESTINAL ENDOSCOPY       Family History  Problem Relation Age of Onset  . Cancer Sister   . Diabetes Brother   . Diabetes Brother   . Hypertension Other   . Breast cancer Sister   . Colon cancer Neg Hx   . Colon polyps Neg Hx   . Rectal cancer Neg Hx   . Stomach cancer Neg Hx     Social History   Social History Narrative   Widow since 2006.Married previously for 25 years.Lives with grandaughter and 2 great grandchildren.Retired.Previously office work.    Social History   Tobacco Use  . Smoking status: Former Smoker    Packs/day: 1.00    Years: 32.00    Pack years: 32.00    Types: E-cigarettes, Cigarettes    Quit date: 11/20/2016    Years since quitting: 3.5  . Smokeless tobacco: Never Used  . Tobacco comment: Smoker since 1975 has quit and started back several times!!,  Substance Use Topics  . Alcohol use: No    Current Meds  Medication Sig  . amLODipine (NORVASC) 10 MG tablet Take 1 tablet by mouth once daily  . carvedilol (COREG) 3.125 MG tablet TAKE 1 TABLET BY MOUTH TWICE DAILY WITH A MEAL  . Cholecalciferol (VITAMIN D-3) 125 MCG (5000 UT) TABS Take 2 tablets by mouth daily.  Marland Kitchen losartan (COZAAR) 50 MG tablet Take 1 tablet (50 mg total) by mouth daily. Replacing HCTZ  . NP THYROID 60 MG tablet Take 1 tablet (60 mg total) by mouth daily before breakfast.  . [DISCONTINUED] levothyroxine (EUTHYROX) 75 MCG tablet Take 1 tablet (75 mcg total) by mouth daily.      Depression screen [facility] 2/9 04/03/2020 08/05/2017 06/05/2017 04/22/2017 10/28/2016  Decreased Interest 0 0 1 1 0  Down, Depressed, Hopeless 0 0 0 1 0  PHQ - 2 Score 0 0 1 2 0  Altered sleeping - - 1 1 0  Tired, decreased energy - - 1 2 0  Change in appetite - - 0 1 0  Feeling bad or failure about yourself  - - 1 0 0  Trouble concentrating - - 0 1 0  Moving slowly or fidgety/restless - - 0 0 0  Suicidal thoughts - - 0 0 0  PHQ-9 Score - - 4 7 0  Difficult doing work/chores - - Not difficult at all Not difficult at all Not difficult at all     Objective:   Today's Vitals: BP 120/70 (BP Location: Right Arm, Patient Position: Sitting, Cuff Size: Normal)   Pulse 67   Temp (!) 97.2 F (36.2 C) (Temporal)   Resp 18   Ht 5\' 3"  (1.6 m)   Wt 197 lb 6.4 oz (89.5 kg)   SpO2 98%   BMI 34.97 kg/m  Vitals with BMI 05/31/2020 04/03/2020 02/03/2020  Height 5\' 3"  5\' 3"  5\' 3"   Weight 197 lbs 6 oz 203 lbs 199 lbs 6 oz  BMI 34.98 58.85 02.77  Systolic 412 878 676  Diastolic  70 70 71  Pulse 67 78 51     Physical Exam   Although she remains obese, she has lost 6 pounds in weight.  Blood pressure is well controlled.  Examination of the left knee shows some swelling compared to the right knee.  I think there may be a  small effusion.  But I am not sure.    Assessment   1. Acquired hypothyroidism   2. Essential hypertension   3. Vitamin D deficiency disease   4. Pain and swelling of knee, left   5. Diabetes mellitus without complication ([place])       Tests ordered Orders Placed This Encounter  Procedures  . COMPLETE METABOLIC PANEL WITH GFR  . Hemoglobin A1c  . VITAMIN D 25 Hydroxy (Vit-D Deficiency, Fractures)  . T3, free  . TSH  . Ambulatory referral to Orthopedic Surgery     Plan: 1. She will continue with the same dose of desiccated NP thyroid and we will check thyroid function. 2. She will continue with antihypertensive medications which are keeping her blood pressure under reasonable control. 3. I will check vitamin D levels and she will continue with vitamin D3 10,000 units daily. 4. I will refer to orthopedic surgery for left knee swelling. 5. Follow-up in 3 months.  Further recommendations will depend on blood results.   No orders of the defined types were placed in this encounter.   Doree Albee, MD

## 2020-06-01 ENCOUNTER — Other Ambulatory Visit (INDEPENDENT_AMBULATORY_CARE_PROVIDER_SITE_OTHER): Payer: Self-pay | Admitting: Internal Medicine

## 2020-06-01 LAB — COMPLETE METABOLIC PANEL WITH GFR
AG Ratio: 1.2 (calc) (ref 1.0–2.5)
ALT: 16 U/L (ref 6–29)
AST: 23 U/L (ref 10–35)
Albumin: 4 g/dL (ref 3.6–5.1)
Alkaline phosphatase (APISO): 77 U/L (ref 37–153)
BUN: 15 mg/dL (ref 7–25)
CO2: 28 mmol/L (ref 20–32)
Calcium: 9.6 mg/dL (ref 8.6–10.4)
Chloride: 105 mmol/L (ref 98–110)
Creat: 0.91 mg/dL (ref 0.60–0.93)
GFR, Est African American: 73 mL/min/{1.73_m2} (ref >=60)
GFR, Est Non African American: 63 mL/min/{1.73_m2} (ref >=60)
Globulin: 3.4 g/dL (calc) (ref 1.9–3.7)
Glucose, Bld: 105 mg/dL — ABNORMAL HIGH (ref 65–99)
Potassium: 4.3 mmol/L (ref 3.5–5.3)
Sodium: 140 mmol/L (ref 135–146)
Total Bilirubin: 0.6 mg/dL (ref 0.2–1.2)
Total Protein: 7.4 g/dL (ref 6.1–8.1)

## 2020-06-01 LAB — T3, FREE: T3, Free: 2.4 pg/mL (ref 2.3–4.2)

## 2020-06-01 LAB — HEMOGLOBIN A1C
Hgb A1c MFr Bld: 6.4 % of total Hgb — ABNORMAL HIGH (ref <5.7)
Mean Plasma Glucose: 137 mg/dL
eAG (mmol/L): 7.6 mmol/L

## 2020-06-01 LAB — TSH: TSH: 10.53 mIU/L — ABNORMAL HIGH (ref 0.40–4.50)

## 2020-06-01 LAB — VITAMIN D 25 HYDROXY (VIT D DEFICIENCY, FRACTURES): Vit D, 25-Hydroxy: 46 ng/mL (ref 30–100)

## 2020-06-01 MED ORDER — NP THYROID 90 MG PO TABS: 90.0000 mg | ORAL_TABLET | Freq: Every day | ORAL | 3 refills | Status: DC

## 2020-06-06 ENCOUNTER — Telehealth (INDEPENDENT_AMBULATORY_CARE_PROVIDER_SITE_OTHER): Payer: Self-pay

## 2020-06-06 NOTE — Telephone Encounter (Signed)
Pt called today and wanted to know why she more on this went up in price. When it was just a dose change. Also Dr.Harrison appt status?

## 2020-06-06 NOTE — Progress Notes (Signed)
PT DID READ  THE LAB RESULTS. T IS UPSET BECAUSE THE NP THYROID WAS COST MORE FOR SAME MED. ONLY  THING WAS DOSAGE CHANGED. THEY WOULD NOT ACCEPT THE GOOD RX CARD.

## 2020-06-13 ENCOUNTER — Telehealth (INDEPENDENT_AMBULATORY_CARE_PROVIDER_SITE_OTHER): Payer: Self-pay

## 2020-06-13 NOTE — Telephone Encounter (Signed)
Okay, thanks

## 2020-06-13 NOTE — Telephone Encounter (Signed)
Patient called back today. She said she has seen Dr. Maureen Ralphs for this knee. She stated that he had drawn fluid off her knee but she couldn't remember if any x-rays have been done. I told her that she would need to have the notes and xrays from Dr. Peri Maris office for Dr. Aline Brochure to review. She will get those for [place]. This is message sent back from Dr Aline Brochure office.

## 2020-06-27 ENCOUNTER — Telehealth (INDEPENDENT_AMBULATORY_CARE_PROVIDER_SITE_OTHER): Payer: Self-pay

## 2020-06-27 DIAGNOSIS — I428 Other cardiomyopathies: Secondary | ICD-10-CM | POA: Insufficient documentation

## 2020-06-27 NOTE — Progress Notes (Signed)
Cardiology Office Note   Date:  06/28/2020   ID:  Nil, [place] 03/27/1946, MRN [id number]  PCP:  Doree Albee, MD  Cardiologist:   Minus Breeding, MD   Chief Complaint  Patient presents with  . Cardiomyopathy      History of Present Illness:  Adrienne [name] is a 75 y.o. female who presents for follow up of CHF.  She had an EF of 44% in 2010.  She has been followed by Dr. Acie Fredrickson but is switching her care to this office.   EF was 55% in 2015.    She does not recall ever having any fluid symptoms.  She does remember having any congestive heart failure symptoms such as shortness of breath or lower extremity swelling.  She has never had chest pressure, neck or arm discomfort.  She has had high blood pressure which I suspect was the etiology.  She says this is well controlled.  She is very limited by knee pain.  She uses a cane and she had a lot of trouble just moving in the office today.  She is trying to get this looked at and switch her care to [facility] and might need further surgeries.  With her limited activity she denies any cardiovascular symptoms.  She does say that the leg pain is not just in the knees but goes away down to her feet.   Past Medical History:  Diagnosis Date  . Ambulates with cane    straight cane  . Arthritis    knee, back  . CHF (congestive heart failure) ([place])   . 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 Gout   . History of blood transfusion 1986   w/ Hysterectomy surgery  . Hyperlipidemia    diet controlled - no meds  . Hypertension   . Hypothyroidism   . Pelvic kidney    lower right pelvic kidney   . Prediabetes    diet controlled - no meds  . SBO (small bowel obstruction) ([place]) 10/2016   surgery   . Sleep apnea    Mild - no mask needed per sleep study  . Smoker    quit smoking 2018    Past Surgical History:  Procedure Laterality Date  . ABDOMINAL HYSTERECTOMY  1986    COMPLETE-precancerous  . APPENDECTOMY     pt states it was removed when gallbladder was removed.  Marland Kitchen BACK SURGERY     lower back  . BREAST LUMPECTOMY WITH RADIOACTIVE SEED LOCALIZATION Right 03/18/2019   Procedure: RIGHT BREAST LUMPECTOMY WITH RADIOACTIVE SEED LOCALIZATION;  Surgeon: Jovita Kussmaul, MD;  Location: [facility];  Service: General;  Laterality: Right;  . CHOLECYSTECTOMY    . COLONOSCOPY  02/15/2008   [name]   . ECTOPIC PREGNANCY SURGERY    . JOINT REPLACEMENT     Left hip total Dr. [name] 10-01-17  . polypectomy-oropharynx    . RE-EXCISION OF BREAST CANCER,SUPERIOR MARGINS Right 04/08/2019   Procedure: RE-EXCISION OF RIGHT BREAST ANTERIOR MARGINS;  Surgeon: Jovita Kussmaul, MD;  [facility];  Service: General;  Laterality: Right;  . right knee meniscus    . SBO  10/2016   small bowel obstruction  . SIMPLE MASTECTOMY WITH AXILLARY SENTINEL NODE BIOPSY Right 07/26/2019   Procedure: RIGHT MASTECTOMY WITH SENTINEL NODE BIOPSY;  Surgeon: Jovita Kussmaul, MD;  Location: [facility];  Service: General;  Laterality: Right;  . TOTAL HIP ARTHROPLASTY  Left 10/01/2017   Procedure: LEFT  TOTAL HIP ARTHROPLASTY ANTERIOR APPROACH;  Surgeon: [name], MD;  [facility];  Service: Orthopedics;  Laterality: Left;  . TOTAL HIP ARTHROPLASTY Right 03/11/2018   Procedure: RIGHT TOTAL HIP ARTHROPLASTY ANTERIOR APPROACH;  Surgeon: [name], MD;  [facility];  Service: Orthopedics;  Laterality: Right;  179min  . UPPER GASTROINTESTINAL ENDOSCOPY       Current Outpatient Medications  Medication Sig Dispense Refill  . amLODipine (NORVASC) 10 MG tablet Take 1 tablet by mouth once daily 90 tablet 0  . carvedilol (COREG) 3.125 MG tablet TAKE 1 TABLET BY MOUTH TWICE DAILY WITH A MEAL 180 tablet 0  . Cholecalciferol (VITAMIN D-3) 125 MCG (5000 UT) TABS Take 2 tablets by mouth daily.    Marland Kitchen losartan (COZAAR) 50 MG tablet Take 1 tablet (50 mg total) by mouth daily. Replacing HCTZ  90 tablet 3  . NP THYROID 90 MG tablet Take 1 tablet (90 mg total) by mouth daily. 30 tablet 3   No current facility-administered medications for this visit.    Allergies:   Lisinopril and Atorvastatin    ROS:  Please see the history of present illness.   Otherwise, review of systems are positive for none.   All other systems are reviewed and negative.    PHYSICAL EXAM: VS:  BP (!) 150/90   Pulse (!) 58   Ht 5\' 3"  (1.6 m)   Wt 197 lb (89.4 kg)   BMI 34.90 kg/m  , BMI Body mass index is 34.9 kg/m.  GENERAL:  Well appearing NECK:  No jugular venous distention, waveform within normal limits, carotid upstroke brisk and symmetric, no bruits, no thyromegaly LUNGS:  Clear to auscultation bilaterally CHEST:  Unremarkable HEART:  PMI not displaced or sustained,S1 and S2 within normal limits, no S3, no S4, no clicks, no rubs, no murmurs ABD:  Flat, positive bowel sounds normal in frequency in pitch, no bruits, no rebound, no guarding, no midline pulsatile mass, no hepatomegaly, no splenomegaly EXT:  2 plus pulses upper and decreased dorsalis pedis and posterior tibialis bilateral lower, no edema, no cyanosis no clubbing   EKG:  EKG is ordered today. The ekg ordered today demonstrates sinus bradycardia, rate 58, axis within normal limits, intervals within normal limits, no acute ST-T wave changes.   Recent Labs: 02/03/2020: Hemoglobin 13.2; Platelets 230 05/31/2020: ALT 16; BUN 15; Creat 0.91; Potassium 4.3; Sodium 140; TSH 10.53    Lipid Panel    Component Value Date/Time   CHOL 279 (H) 02/03/2020 0940   TRIG 212 (H) 02/03/2020 0940   HDL 57 02/03/2020 0940   CHOLHDL 4.9 02/03/2020 0940   VLDL 20 04/08/2016 0933   LDLCALC 182 (H) 02/03/2020 0940      Wt Readings from Last 3 Encounters:  06/28/20 197 lb (89.4 kg)  05/31/20 197 lb 6.4 oz (89.5 kg)  04/03/20 203 lb (92.1 kg)      Other studies Reviewed: Additional studies/ records that were reviewed today include:  Labs. Review of the above records demonstrates:  Please see elsewhere in the note.     ASSESSMENT AND PLAN:  CARDIOMYOPATHY:   Her ejection fraction was improved and she has no symptoms.  I do not think further imaging is indicated.  No change in therapy.  HTN: Her blood pressure is elevated today but this is quite unusual.  No change in therapy.  She will keep a blood pressure diary at home and she does check her  pressures.  They might be elevated today because she has such significant pain.  LEG PAIN: She has leg pain and decreased pulses and I will check ABIs.  DYSLIPIDEMIA: She thinks she is overmedicated.  Her LDL is 182.  I do not think I can talk her into a statin but I will readdress this at future appointments.  Certainly if she has evidence of peripheral vascular disease we will have to be much more aggressive.  Current medicines are reviewed at length with the patient today.  The patient does not have concerns regarding medicines.  The following changes have been made:  no change  Labs/ tests ordered today include:   Orders Placed This Encounter  Procedures  . US ARTERIAL SEG MULTIPLE LE (ABI, SEGMENTAL PRESSURES, PVR'S)  . US ARTERIAL ABI (SCREENING LOWER EXTREMITY)  . EKG 12-Lead     Disposition:   FU with me in 1 year or sooner if needed.   Signed, Rollene Rotunda, MD  06/28/2020 11:05 AM    [facility]

## 2020-06-27 NOTE — Telephone Encounter (Signed)
Pt called stating she is having a great deal of pain and swelling on her knee/ joints. Pt stated she Was told that in order to be seen from Dr Lequita Halt office she will need to have the x-rays from Dr Lequita Halt office sent to office, then they can set appt.  But pt stated they said they sent a e-mail with information; she cannot see it or find in her e-mail account.   Pt asked is there anything she  Can do now to get   Relief? Is it the new thyroid medication;is it  that has her hurting?

## 2020-06-28 ENCOUNTER — Ambulatory Visit: Payer: PPO | Admitting: Cardiology

## 2020-06-28 ENCOUNTER — Ambulatory Visit (INDEPENDENT_AMBULATORY_CARE_PROVIDER_SITE_OTHER): Payer: PPO | Admitting: Internal Medicine

## 2020-06-28 ENCOUNTER — Encounter: Payer: Self-pay | Admitting: Cardiology

## 2020-06-28 ENCOUNTER — Other Ambulatory Visit: Payer: Self-pay

## 2020-06-28 ENCOUNTER — Encounter (INDEPENDENT_AMBULATORY_CARE_PROVIDER_SITE_OTHER): Payer: Self-pay | Admitting: Internal Medicine

## 2020-06-28 VITALS — BP 150/90 | HR 58 | Ht 63.0 in | Wt 197.0 lb

## 2020-06-28 VITALS — BP 140/82 | HR 78 | Temp 97.8°F | Resp 18 | Ht 63.0 in | Wt 197.0 lb

## 2020-06-28 DIAGNOSIS — I428 Other cardiomyopathies: Secondary | ICD-10-CM | POA: Diagnosis not present

## 2020-06-28 DIAGNOSIS — M25562 Pain in left knee: Secondary | ICD-10-CM | POA: Diagnosis not present

## 2020-06-28 DIAGNOSIS — Z720 Tobacco use: Secondary | ICD-10-CM | POA: Diagnosis not present

## 2020-06-28 DIAGNOSIS — G8929 Other chronic pain: Secondary | ICD-10-CM

## 2020-06-28 DIAGNOSIS — M79606 Pain in leg, unspecified: Secondary | ICD-10-CM | POA: Diagnosis not present

## 2020-06-28 MED ORDER — PREDNISONE 20 MG PO TABS: 40.0000 mg | ORAL_TABLET | Freq: Every day | ORAL | 1 refills | Status: DC

## 2020-06-28 NOTE — Progress Notes (Signed)
Metrics: Intervention Frequency ACO  Documented Smoking Status Yearly  Screened one or more times in 24 months  Cessation Counseling or  Active cessation medication Past 24 months  Past 24 months   Guideline developer: UpToDate (See UpToDate for funding source) Date Released: 2014       Wellness Office Visit  Subjective:  Patient ID: Adrienne [name], female    DOB: [birth year]  Age: 75 y.o. MRN: [id number]  CC: Left knee pain HPI  This lady comes in now with a 3-week history of left knee pain.  She has been seeing an orthopedic surgeon in [place] but would like someone locally.  We are in the process of trying to get her to see Dr. Aline Brochure here in [place].  The knee does not appear to be swollen but it is painful to walk on. Past Medical History:  Diagnosis Date  . Ambulates with cane    straight cane  . Arthritis    knee, back  . CHF (congestive heart failure) ([place])   . 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 Gout   . History of blood transfusion 1986   w/ Hysterectomy surgery  . Hyperlipidemia    diet controlled - no meds  . Hypertension   . Hypothyroidism   . Pelvic kidney    lower right pelvic kidney   . Prediabetes    diet controlled - no meds  . SBO (small bowel obstruction) ([place]) 10/2016   surgery   . Sleep apnea    Mild - no mask needed per sleep study  . Smoker    quit smoking 2018   Past Surgical History:  Procedure Laterality Date  . ABDOMINAL HYSTERECTOMY  1986   COMPLETE-precancerous  . APPENDECTOMY     pt states it was removed when gallbladder was removed.  Marland Kitchen BACK SURGERY     lower back  . BREAST LUMPECTOMY WITH RADIOACTIVE SEED LOCALIZATION Right 03/18/2019   Procedure: RIGHT BREAST LUMPECTOMY WITH RADIOACTIVE SEED LOCALIZATION;  Surgeon: Jovita Kussmaul, MD;  Location: [facility];  Service: General;  Laterality: Right;  . CHOLECYSTECTOMY    . COLONOSCOPY  02/15/2008   Adrienne [name]   . ECTOPIC PREGNANCY SURGERY     . JOINT REPLACEMENT     Left hip total Dr. [name] 10-01-17  . polypectomy-oropharynx    . RE-EXCISION OF BREAST CANCER,SUPERIOR MARGINS Right 04/08/2019   Procedure: RE-EXCISION OF RIGHT BREAST ANTERIOR MARGINS;  Surgeon: Jovita Kussmaul, MD;  [facility];  Service: General;  Laterality: Right;  . right knee meniscus    . SBO  10/2016   small bowel obstruction  . SIMPLE MASTECTOMY WITH AXILLARY SENTINEL NODE BIOPSY Right 07/26/2019   Procedure: RIGHT MASTECTOMY WITH SENTINEL NODE BIOPSY;  Surgeon: Jovita Kussmaul, MD;  Location: [facility];  Service: General;  Laterality: Right;  . TOTAL HIP ARTHROPLASTY Left 10/01/2017   Procedure: LEFT  TOTAL HIP ARTHROPLASTY ANTERIOR APPROACH;  Surgeon: [name], MD;  [facility];  Service: Orthopedics;  Laterality: Left;  . TOTAL HIP ARTHROPLASTY Right 03/11/2018   Procedure: RIGHT TOTAL HIP ARTHROPLASTY ANTERIOR APPROACH;  Surgeon: [name], MD;  [facility];  Service: Orthopedics;  Laterality: Right;  111min  . UPPER GASTROINTESTINAL ENDOSCOPY       Family History  Problem Relation Age of Onset  . Cancer Sister   . Diabetes Brother   . Diabetes Brother   . Hypertension Other   .  Breast cancer Sister   . Colon cancer Neg Hx   . Colon polyps Neg Hx   . Rectal cancer Neg Hx   . Stomach cancer Neg Hx     Social History   Social History Narrative   Widow since 2006.Married previously for 25 years.Lives with grandaughter and 2 great grandchildren.Retired.Previously office work.   Social History   Tobacco Use  . Smoking status: Former Smoker    Packs/day: 1.00    Years: 32.00    Pack years: 32.00    Types: E-cigarettes, Cigarettes    Quit date: 11/20/2016    Years since quitting: 3.6  . Smokeless tobacco: Never Used  . Tobacco comment: Smoker since 1975 has quit and started back several times!!,  Substance Use Topics  . Alcohol use: No    Current Meds  Medication Sig  . amLODipine (NORVASC) 10  MG tablet Take 1 tablet by mouth once daily  . carvedilol (COREG) 3.125 MG tablet TAKE 1 TABLET BY MOUTH TWICE DAILY WITH A MEAL  . Cholecalciferol (VITAMIN D-3) 125 MCG (5000 UT) TABS Take 2 tablets by mouth daily.  Marland Kitchen losartan (COZAAR) 50 MG tablet Take 1 tablet (50 mg total) by mouth daily. Replacing HCTZ  . NP THYROID 90 MG tablet Take 1 tablet (90 mg total) by mouth daily.  . predniSONE (DELTASONE) 20 MG tablet Take 2 tablets (40 mg total) by mouth daily with breakfast.      Depression screen [facility] 2/9 04/03/2020 08/05/2017 06/05/2017 04/22/2017 10/28/2016  Decreased Interest 0 0 1 1 0  Down, Depressed, Hopeless 0 0 0 1 0  PHQ - 2 Score 0 0 1 2 0  Altered sleeping - - 1 1 0  Tired, decreased energy - - 1 2 0  Change in appetite - - 0 1 0  Feeling bad or failure about yourself  - - 1 0 0  Trouble concentrating - - 0 1 0  Moving slowly or fidgety/restless - - 0 0 0  Suicidal thoughts - - 0 0 0  PHQ-9 Score - - 4 7 0  Difficult doing work/chores - - Not difficult at all Not difficult at all Not difficult at all     Objective:   Today's Vitals: BP 140/82 (BP Location: Left Arm, Patient Position: Sitting, Cuff Size: Large)   Pulse 78   Temp 97.8 F (36.6 C) (Temporal)   Resp 18   Ht 5\' 3"  (1.6 m)   Wt 197 lb (89.4 kg)   SpO2 98%   BMI 34.90 kg/m  Vitals with BMI 06/28/2020 06/28/2020 05/31/2020  Height 5\' 3"  5\' 3"  5\' 3"   Weight 197 lbs 197 lbs 197 lbs 6 oz  BMI 34.91 34.91 34.98  Systolic 140 150 14/01/2020  Diastolic 82 90 70  Pulse 78 58 67     Physical Exam  On examination of the knee, there is some crepitus in the left knee but there is no significant swelling.  There is also crepitus in the right knee but this is not very painful.     Assessment   1. Chronic pain of left knee       Tests ordered No orders of the defined types were placed in this encounter.    Plan: 1. I will treat her for the time being with steroids to get relief and she will be given  dexamethasone 4 mg intramuscularly in the office today and I have sent a prescription for prednisone 40 mg every morning for  5 days.  She is aware of possible side effects of steroids and how to deal with them. 2. In the meantime, we will try to get her to see Dr. Aline Brochure as soon as possible.   Meds ordered this encounter  Medications  . predniSONE (DELTASONE) 20 MG tablet    Sig: Take 2 tablets (40 mg total) by mouth daily with breakfast.    Dispense:  10 tablet    Refill:  1    Jakyri Brunkhorst Luther Parody, MD

## 2020-06-28 NOTE — Telephone Encounter (Signed)
I called offered a appointment to her via voicemail. Just waiting to see if she is will to come for a acute visit. Ask her to call back to let us know if she could come in soon.

## 2020-06-28 NOTE — Telephone Encounter (Signed)
Scheduled her for 5:15 today with you for acute visit

## 2020-06-28 NOTE — Telephone Encounter (Signed)
It is unlikely that the thyroid medication is causing this pain.  May be it is easier to see her and reevaluate her first .  Please get her to have an appointment with Maralyn Sago or me.

## 2020-06-28 NOTE — Patient Instructions (Signed)
Medication Instructions:  The current medical regimen is effective;  continue present plan and medications.  *If you need a refill on your cardiac medications before your next appointment, please call your pharmacy*  Testing/Procedures: Your physician has requested that you have a lower extremity arterial exercise duplex. During this test, exercise and ultrasound are used to evaluate arterial blood flow in the legs. Allow one hour for this exam. There are no restrictions or special instructions. You will be contacted to be scheduled for this testing which will be completed at the [facility] location.  Follow-Up: At [facility], you and your health needs are our priority.  As part of our continuing mission to provide you with exceptional heart care, we have created designated Provider Care Teams.  These Care Teams include your primary Cardiologist (physician) and Advanced Practice Providers (APPs -  Physician Assistants and Nurse Practitioners) who all work together to provide you with the care you need, when you need it.  We recommend signing up for the patient portal called "MyChart".  Sign up information is provided on this After Visit Summary.  MyChart is used to connect with patients for Virtual Visits (Telemedicine).  Patients are able to view lab/test results, encounter notes, upcoming appointments, etc.  Non-urgent messages can be sent to your provider as well.   To learn more about what you can do with MyChart, go to [email or web address].    Your next appointment:   12 month(s)  The format for your next appointment:   In Person  Provider:   Rollene Rotunda, MD   Thank you for choosing [facility]!!

## 2020-06-30 ENCOUNTER — Telehealth: Payer: Self-pay | Admitting: Orthopedic Surgery

## 2020-06-30 NOTE — Telephone Encounter (Signed)
Call via voice message had been received from patient during days we had been out partial days this week, with question regarding records. Reached patient's voice mail, left message.

## 2020-07-12 ENCOUNTER — Ambulatory Visit: Payer: PPO | Admitting: Cardiology

## 2020-07-21 ENCOUNTER — Ambulatory Visit (INDEPENDENT_AMBULATORY_CARE_PROVIDER_SITE_OTHER): Payer: PPO | Admitting: Orthopedic Surgery

## 2020-07-21 ENCOUNTER — Encounter: Payer: Self-pay | Admitting: Orthopedic Surgery

## 2020-07-21 ENCOUNTER — Other Ambulatory Visit: Payer: Self-pay

## 2020-07-21 VITALS — BP 146/86 | HR 68 | Ht 63.0 in | Wt 197.0 lb

## 2020-07-21 DIAGNOSIS — M170 Bilateral primary osteoarthritis of knee: Secondary | ICD-10-CM

## 2020-07-21 MED ORDER — DICLOFENAC-MISOPROSTOL 50-0.2 MG PO TBEC: 1.0000 | DELAYED_RELEASE_TABLET | Freq: Two times a day (BID) | ORAL | 2 refills | Status: DC

## 2020-07-21 NOTE — Progress Notes (Signed)
New Patient Visit  Assessment: Adrienne [name] is a 75 y.o. female with the following: Bilateral knee arthritis  Plan: Adrienne [name] has moderate to severe arthritis in bilateral knees.  We reviewed the radiographs in clinic today, and I outlined the natural progression.  We had an extensive discussion regarding all potential treatment options, including continuing with the current treatment. NSAIDs are the most appropriate medications, and these are available OTC or via prescription.  I have urged them to remain active, and they can continue with activities on their own, or we can refer them to physical therapy.  We can also consider a brace, or compression sleeve. If the pain is severe enough, we can consider a steroid injection.  If their knee pain is affecting their everyday activities, including sleep, knee replacement is a consideration, but we would have to refer them to see my partner Adrienne [name].  After discussing all of these options, the patient has elected to proceed with prescription medication for symptomatic control, as well as bilateral knee injections.  These were completed in clinic today.  Ultimately, she may consider knee replacements in the future, but is not interested at this time.  She can return for repeat injections in another 3 months.  Otherwise, follow-up as needed.  Procedure note injection Left knee joint   Verbal consent was obtained to inject the left knee joint  Timeout was completed to confirm the site of injection.  The skin was prepped with alcohol and ethyl chloride was sprayed at the injection site.  A 21-gauge needle was used to inject 40 mg of Depo-Medrol and 1% lidocaine (3 cc) into the left knee using an anterolateral approach.  There were no complications. A sterile bandage was applied.     Procedure note injection Right knee joint   Verbal consent was obtained to inject the right knee joint  Timeout was completed to confirm the site of  injection.  The skin was prepped with alcohol and ethyl chloride was sprayed at the injection site.  A 21-gauge needle was used to inject 40 mg of Depo-Medrol and 1% lidocaine (3 cc) into the left knee using an anterolateral approach.  There were no complications. A sterile bandage was applied.     Follow-up: Return if symptoms worsen or fail to improve.  Subjective:  Chief Complaint  Patient presents with  . Knee Pain    Left knee pt reports 7/10 today.     History of Present Illness: Adrienne [name] is a 75 y.o. female who has been referred to clinic today by Adrienne Party, MD for evaluation of bilateral knee pain.  She states she has had progressively worsening pain in both of her knees for several years.  She was previously evaluated by Adrienne [name] who ultimately recommended hip replacement as the degenerative changes were noted to be worse.  She has since had the surgery, and recovered well.  However, she continues to have pain in both knees.  She not currently taking medications on a consistent basis.  She is not had recent injections in her knees.  She continues to ambulate with assistance of a cane, but is severely limited.  She is not interested in surgery at this time.   Review of Systems: No fevers or chills  No numbness or tingling No chest pain No shortness of breath No bowel or bladder dysfunction No GI distress No headaches   Medical History:  Past Medical History:  Diagnosis Date  . Ambulates with  cane    straight cane  . Arthritis    knee, back  . CHF (congestive heart failure) ([place])   . 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 Gout   . History of blood transfusion 1986   w/ Hysterectomy surgery  . Hyperlipidemia    diet controlled - no meds  . Hypertension   . Hypothyroidism   . Pelvic kidney    lower right pelvic kidney   . Prediabetes    diet controlled - no meds  . SBO (small bowel obstruction) ([place])  10/2016   surgery   . Sleep apnea    Mild - no mask needed per sleep study  . Smoker    quit smoking 2018    Past Surgical History:  Procedure Laterality Date  . ABDOMINAL HYSTERECTOMY  1986   COMPLETE-precancerous  . APPENDECTOMY     pt states it was removed when gallbladder was removed.  Marland Kitchen BACK SURGERY     lower back  . BREAST LUMPECTOMY WITH RADIOACTIVE SEED LOCALIZATION Right 03/18/2019   Procedure: RIGHT BREAST LUMPECTOMY WITH RADIOACTIVE SEED LOCALIZATION;  Surgeon: Adrienne Kussmaul, MD;  Location: [facility];  Service: General;  Laterality: Right;  . CHOLECYSTECTOMY    . COLONOSCOPY  02/15/2008   [name]   . ECTOPIC PREGNANCY SURGERY    . JOINT REPLACEMENT     Left hip total Adrienne [name] 10-01-17  . polypectomy-oropharynx    . RE-EXCISION OF BREAST CANCER,SUPERIOR MARGINS Right 04/08/2019   Procedure: RE-EXCISION OF RIGHT BREAST ANTERIOR MARGINS;  Surgeon: Adrienne Kussmaul, MD;  [facility];  Service: General;  Laterality: Right;  . right knee meniscus    . SBO  10/2016   small bowel obstruction  . SIMPLE MASTECTOMY WITH AXILLARY SENTINEL NODE BIOPSY Right 07/26/2019   Procedure: RIGHT MASTECTOMY WITH SENTINEL NODE BIOPSY;  Surgeon: Adrienne Kussmaul, MD;  Location: [facility];  Service: General;  Laterality: Right;  . TOTAL HIP ARTHROPLASTY Left 10/01/2017   Procedure: LEFT  TOTAL HIP ARTHROPLASTY ANTERIOR APPROACH;  Surgeon: Adrienne Arabian, MD;  [facility];  Service: Orthopedics;  Laterality: Left;  . TOTAL HIP ARTHROPLASTY Right 03/11/2018   Procedure: RIGHT TOTAL HIP ARTHROPLASTY ANTERIOR APPROACH;  Surgeon: Adrienne Arabian, MD;  [facility];  Service: Orthopedics;  Laterality: Right;  130min  . UPPER GASTROINTESTINAL ENDOSCOPY      Family History  Problem Relation Age of Onset  . Cancer Sister   . Diabetes Brother   . Diabetes Brother   . Hypertension Other   . Breast cancer Sister   . Colon cancer Neg Hx   . Colon polyps Neg Hx   . Rectal cancer  Neg Hx   . Stomach cancer Neg Hx    Social History   Tobacco Use  . Smoking status: Former Smoker    Packs/day: 1.00    Years: 32.00    Pack years: 32.00    Types: E-cigarettes, Cigarettes    Quit date: 11/20/2016    Years since quitting: 3.6  . Smokeless tobacco: Never Used  . Tobacco comment: Smoker since 1975 has quit and started back several times!!,  Vaping Use  . Vaping Use: Former  Substance Use Topics  . Alcohol use: No  . Drug use: No    Allergies  Allergen Reactions  . Lisinopril Swelling    Angioedema  . Atorvastatin Other (See Comments)    Muscle aches  Current Meds  Medication Sig  . amLODipine (NORVASC) 10 MG tablet Take 1 tablet by mouth once daily  . carvedilol (COREG) 3.125 MG tablet TAKE 1 TABLET BY MOUTH TWICE DAILY WITH A MEAL  . Cholecalciferol (VITAMIN D-3) 125 MCG (5000 UT) TABS Take 2 tablets by mouth daily.  . Diclofenac-miSOPROStol 50-0.2 MG TBEC Take 1 tablet by mouth 2 (two) times daily.  Marland Kitchen losartan (COZAAR) 50 MG tablet Take 1 tablet (50 mg total) by mouth daily. Replacing HCTZ  . NP THYROID 90 MG tablet Take 1 tablet (90 mg total) by mouth daily.  . predniSONE (DELTASONE) 20 MG tablet Take 2 tablets (40 mg total) by mouth daily with breakfast.    Objective: BP (!) 146/86   Pulse 68   Ht 5\' 3"  (1.6 m)   Wt 197 lb (89.4 kg)   BMI 34.90 kg/m   Physical Exam:  General: Alert and oriented, no acute distress. Gait: Severely limited, uses a cane.  Slow.  Evaluation of the left knee demonstrates no effusion.  15 degree flexion contracture.  Tenderness palpation along the medial joint line.  Crepitus is appreciated.  Negative Lachman.  No increased instability varus valgus stress.  Evaluation of the right knee demonstrates no effusion.  5 degree flexion contracture.  Flexion to greater than 110 degrees with minimal discomfort.  Tenderness palpation along the medial joint line.  Crepitus is appreciated.  Negative Lachman.  IMAGING: I  personally reviewed images previously obtained in clinic   X-rays from July, 2021 includes a single AP x-ray of bilateral knees.  There is significant loss of joint space in both knees, primarily within the medial compartment.  Essentially bone-on-bone arthritis in the medial compartment of both the right and left knee.   New Medications:  Meds ordered this encounter  Medications  . Diclofenac-miSOPROStol 50-0.2 MG TBEC    Sig: Take 1 tablet by mouth 2 (two) times daily.    Dispense:  60 tablet    Refill:  2      Mordecai Rasmussen, MD  07/21/2020 1:02 PM

## 2020-07-21 NOTE — Patient Instructions (Signed)

## 2020-08-11 ENCOUNTER — Telehealth: Payer: Self-pay | Admitting: Cardiology

## 2020-08-11 NOTE — Telephone Encounter (Signed)
Pre-cert Verification for the following procedure    US ARTERIAL SEG MULTIPE  DATE:   08/17/2020  [place]

## 2020-08-17 ENCOUNTER — Inpatient Hospital Stay (HOSPITAL_COMMUNITY): Admission: RE | Admit: 2020-08-17 | Payer: PPO | Source: Ambulatory Visit

## 2020-08-17 ENCOUNTER — Ambulatory Visit (HOSPITAL_COMMUNITY)
Admission: RE | Admit: 2020-08-17 | Discharge: 2020-08-17 | Disposition: A | Discharge status: Home / Self Care | Payer: PPO | Source: Ambulatory Visit | Attending: Cardiology | Admitting: Cardiology

## 2020-08-17 ENCOUNTER — Other Ambulatory Visit: Payer: Self-pay

## 2020-08-17 DIAGNOSIS — M79606 Pain in leg, unspecified: Secondary | ICD-10-CM | POA: Diagnosis not present

## 2020-08-17 DIAGNOSIS — I739 Peripheral vascular disease, unspecified: Secondary | ICD-10-CM | POA: Diagnosis not present

## 2020-08-18 ENCOUNTER — Other Ambulatory Visit: Payer: Self-pay

## 2020-08-18 DIAGNOSIS — M79604 Pain in right leg: Secondary | ICD-10-CM

## 2020-08-18 DIAGNOSIS — M79605 Pain in left leg: Secondary | ICD-10-CM

## 2020-08-18 NOTE — Progress Notes (Signed)
Referral placed to Dr. Gwenlyn Found for leg pain and reduced ABIs. Voicemail left for patient per DPR.

## 2020-08-22 ENCOUNTER — Other Ambulatory Visit: Payer: Self-pay

## 2020-08-22 DIAGNOSIS — M79604 Pain in right leg: Secondary | ICD-10-CM

## 2020-08-22 NOTE — Progress Notes (Unsigned)
Referral pending for Dr. Donnetta Hutching. Left voicemail for patient to call back and okay referral be placed. Will await call back.

## 2020-08-29 ENCOUNTER — Encounter (INDEPENDENT_AMBULATORY_CARE_PROVIDER_SITE_OTHER): Payer: Self-pay | Admitting: Internal Medicine

## 2020-08-29 ENCOUNTER — Ambulatory Visit (INDEPENDENT_AMBULATORY_CARE_PROVIDER_SITE_OTHER): Payer: PPO | Admitting: Internal Medicine

## 2020-08-29 ENCOUNTER — Other Ambulatory Visit: Payer: Self-pay

## 2020-08-29 VITALS — BP 142/84 | HR 64 | Temp 96.9°F | Ht 63.0 in | Wt 190.0 lb

## 2020-08-29 DIAGNOSIS — E785 Hyperlipidemia, unspecified: Secondary | ICD-10-CM | POA: Diagnosis not present

## 2020-08-29 DIAGNOSIS — E119 Type 2 diabetes mellitus without complications: Secondary | ICD-10-CM | POA: Diagnosis not present

## 2020-08-29 DIAGNOSIS — I10 Essential (primary) hypertension: Secondary | ICD-10-CM | POA: Diagnosis not present

## 2020-08-29 DIAGNOSIS — E039 Hypothyroidism, unspecified: Secondary | ICD-10-CM

## 2020-08-29 NOTE — Progress Notes (Signed)
Metrics: Intervention Frequency ACO  Documented Smoking Status Yearly  Screened one or more times in 24 months  Cessation Counseling or  Active cessation medication Past 24 months  Past 24 months   Guideline developer: UpToDate (See UpToDate for funding source) Date Released: 2014       Wellness Office Visit  Subjective:  Patient ID: Adrienne [name], female    DOB: 08/16/1945  Age: 75 y.o. MRN: [id number]  CC: This lady comes in for follow-up of hypothyroidism, hypertension, diabetes and dyslipidemia. HPI  She is going to see Dr. Gwenlyn Found, cardiologist for probable peripheral vascular disease and I have recommended that she do that. She continues to work on nutrition regarding her diabetes.  Her last hemoglobin A1c was 6.4%. She continues on NP thyroid 90 mg daily for her hypothyroidism.  It is somewhat expensive for her but she is willing to continue with this. She continues on amlodipine, carvedilol and losartan for hypertension without any problems. She continues on vitamin D3 supplementation for vitamin D deficiency. She denies any chest pain, dyspnea, palpitations or limb weakness. Past Medical History:  Diagnosis Date  . Ambulates with cane    straight cane  . Arthritis    knee, back  . CHF (congestive heart failure) ([place])   . 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 Gout   . History of blood transfusion 1986   w/ Hysterectomy surgery  . Hyperlipidemia    diet controlled - no meds  . Hypertension   . Hypothyroidism   . Pelvic kidney    lower right pelvic kidney   . Prediabetes    diet controlled - no meds  . SBO (small bowel obstruction) ([place]) 10/2016   surgery   . Sleep apnea    Mild - no mask needed per sleep study  . Smoker    quit smoking 2018   Past Surgical History:  Procedure Laterality Date  . ABDOMINAL HYSTERECTOMY  1986   COMPLETE-precancerous  . APPENDECTOMY     pt states it was removed when gallbladder was  removed.  Marland Kitchen BACK SURGERY     lower back  . BREAST LUMPECTOMY WITH RADIOACTIVE SEED LOCALIZATION Right 03/18/2019   Procedure: RIGHT BREAST LUMPECTOMY WITH RADIOACTIVE SEED LOCALIZATION;  Surgeon: Jovita Kussmaul, MD;  Location: [facility];  Service: General;  Laterality: Right;  . CHOLECYSTECTOMY    . COLONOSCOPY  02/15/2008   [name]   . ECTOPIC PREGNANCY SURGERY    . JOINT REPLACEMENT     Left hip total Dr. [name] 10-01-17  . polypectomy-oropharynx    . RE-EXCISION OF BREAST CANCER,SUPERIOR MARGINS Right 04/08/2019   Procedure: RE-EXCISION OF RIGHT BREAST ANTERIOR MARGINS;  Surgeon: Jovita Kussmaul, MD;  [facility];  Service: General;  Laterality: Right;  . right knee meniscus    . SBO  10/2016   small bowel obstruction  . SIMPLE MASTECTOMY WITH AXILLARY SENTINEL NODE BIOPSY Right 07/26/2019   Procedure: RIGHT MASTECTOMY WITH SENTINEL NODE BIOPSY;  Surgeon: Jovita Kussmaul, MD;  Location: [facility];  Service: General;  Laterality: Right;  . TOTAL HIP ARTHROPLASTY Left 10/01/2017   Procedure: LEFT  TOTAL HIP ARTHROPLASTY ANTERIOR APPROACH;  Surgeon: [name], MD;  [facility];  Service: Orthopedics;  Laterality: Left;  . TOTAL HIP ARTHROPLASTY Right 03/11/2018   Procedure: RIGHT TOTAL HIP ARTHROPLASTY ANTERIOR APPROACH;  Surgeon: [name], MD;  [facility];  Service: Orthopedics;  Laterality: Right;  144min  . UPPER GASTROINTESTINAL ENDOSCOPY       Family History  Problem Relation Age of Onset  . Cancer Sister   . Diabetes Brother   . Diabetes Brother   . Hypertension Other   . Breast cancer Sister   . Colon cancer Neg Hx   . Colon polyps Neg Hx   . Rectal cancer Neg Hx   . Stomach cancer Neg Hx     Social History   Social History Narrative   Widow since 2006.Married previously for 25 years.Lives with grandaughter and 2 great grandchildren.Retired.Previously office work.   Social History   Tobacco Use  . Smoking status: Former Smoker     Packs/day: 1.00    Years: 32.00    Pack years: 32.00    Types: E-cigarettes, Cigarettes    Quit date: 11/20/2016    Years since quitting: 3.7  . Smokeless tobacco: Never Used  . Tobacco comment: Smoker since 1975 has quit and started back several times!!,  Substance Use Topics  . Alcohol use: No    Current Meds  Medication Sig  . amLODipine (NORVASC) 10 MG tablet Take 1 tablet by mouth once daily  . carvedilol (COREG) 3.125 MG tablet TAKE 1 TABLET BY MOUTH TWICE DAILY WITH A MEAL  . Cholecalciferol (VITAMIN D-3) 125 MCG (5000 UT) TABS Take 2 tablets by mouth daily.  . Diclofenac-miSOPROStol 50-0.2 MG TBEC Take 1 tablet by mouth 2 (two) times daily.  Marland Kitchen losartan (COZAAR) 50 MG tablet Take 1 tablet (50 mg total) by mouth daily. Replacing HCTZ  . NP THYROID 90 MG tablet Take 1 tablet (90 mg total) by mouth daily.     [facility] Office Visit from 08/29/2020 in [facility]  PHQ-9 Total Score 0      Objective:   Today's Vitals: BP (!) 142/84   Pulse 64   Temp (!) 96.9 F (36.1 C) (Temporal)   Ht 5\' 3"  (1.6 m)   Wt 190 lb (86.2 kg)   SpO2 99%   BMI 33.66 kg/m  Vitals with BMI 08/29/2020 07/21/2020 06/28/2020  Height 5\' 3"  5\' 3"  5\' 3"   Weight 190 lbs 197 lbs 197 lbs  BMI 33.67 99.24 26.83  Systolic 419 622 297  Diastolic 84 86 82  Pulse 64 68 78     Physical Exam  She looks systemically well.  She has lost 7 pounds since last visit.  Blood pressure borderline elevated today.  Alert and orientated without any focal neurological signs.  She remains obese.     Assessment   1. Acquired hypothyroidism   2. Essential hypertension   3. Diabetes mellitus without complication ([place])   4. Dyslipidemia       Tests ordered Orders Placed This Encounter  Procedures  . COMPLETE METABOLIC PANEL WITH GFR  . T3, free  . T4, free  . TSH  . Hemoglobin A1c  . Lipid panel     Plan: 1. She will continue with the same dose of NP thyroid and we will check thyroid  function test today. 2. She will continue with antihypertensive therapy above and we will check renal function today. 3. Further recommendations will depend on all the blood work ordered and I will see her in 3 months time for follow-up.   No orders of the defined types were placed in this encounter.   Doree Albee, MD

## 2020-08-30 LAB — COMPLETE METABOLIC PANEL WITH GFR
AG Ratio: 1.4 (calc) (ref 1.0–2.5)
ALT: 15 U/L (ref 6–29)
AST: 19 U/L (ref 10–35)
Albumin: 4.2 g/dL (ref 3.6–5.1)
Alkaline phosphatase (APISO): 71 U/L (ref 37–153)
BUN: 18 mg/dL (ref 7–25)
CO2: 28 mmol/L (ref 20–32)
Calcium: 9.6 mg/dL (ref 8.6–10.4)
Chloride: 106 mmol/L (ref 98–110)
Creat: 0.86 mg/dL (ref 0.60–0.93)
GFR, Est African American: 78 mL/min/{1.73_m2} (ref >=60)
GFR, Est Non African American: 67 mL/min/{1.73_m2} (ref >=60)
Globulin: 2.9 g/dL (calc) (ref 1.9–3.7)
Glucose, Bld: 103 mg/dL — ABNORMAL HIGH (ref 65–99)
Potassium: 4.3 mmol/L (ref 3.5–5.3)
Sodium: 142 mmol/L (ref 135–146)
Total Bilirubin: 0.6 mg/dL (ref 0.2–1.2)
Total Protein: 7.1 g/dL (ref 6.1–8.1)

## 2020-08-30 LAB — HEMOGLOBIN A1C
Hgb A1c MFr Bld: 6.1 % of total Hgb — ABNORMAL HIGH (ref <5.7)
Mean Plasma Glucose: 128 mg/dL
eAG (mmol/L): 7.1 mmol/L

## 2020-08-30 LAB — LIPID PANEL
Cholesterol: 258 mg/dL — ABNORMAL HIGH (ref <200)
HDL: 60 mg/dL (ref >=50)
LDL Cholesterol (Calc): 172 mg/dL (calc) — ABNORMAL HIGH
Non-HDL Cholesterol (Calc): 198 mg/dL (calc) — ABNORMAL HIGH (ref <130)
Total CHOL/HDL Ratio: 4.3 (calc) (ref <5.0)
Triglycerides: 127 mg/dL (ref <150)

## 2020-08-30 LAB — TSH: TSH: 0.87 mIU/L (ref 0.40–4.50)

## 2020-08-30 LAB — T3, FREE: T3, Free: 2.6 pg/mL (ref 2.3–4.2)

## 2020-08-30 LAB — T4, FREE: Free T4: 0.9 ng/dL (ref 0.8–1.8)

## 2020-08-31 ENCOUNTER — Other Ambulatory Visit (INDEPENDENT_AMBULATORY_CARE_PROVIDER_SITE_OTHER): Payer: Self-pay | Admitting: Internal Medicine

## 2020-08-31 MED ORDER — NP THYROID 120 MG PO TABS: 120.0000 mg | ORAL_TABLET | Freq: Every day | ORAL | 3 refills | Status: DC

## 2020-09-05 NOTE — Progress Notes (Signed)
Please call the patient and make sure that she is aware of increasing her thyroid dose as I had sent her a my chart message.  Thanks.

## 2020-09-19 ENCOUNTER — Other Ambulatory Visit: Payer: Self-pay | Admitting: Family Medicine

## 2020-09-25 ENCOUNTER — Other Ambulatory Visit: Payer: Self-pay | Admitting: Family Medicine

## 2020-09-25 ENCOUNTER — Other Ambulatory Visit (INDEPENDENT_AMBULATORY_CARE_PROVIDER_SITE_OTHER): Payer: Self-pay | Admitting: Internal Medicine

## 2020-09-25 ENCOUNTER — Telehealth (INDEPENDENT_AMBULATORY_CARE_PROVIDER_SITE_OTHER): Payer: Self-pay

## 2020-09-25 MED ORDER — AMLODIPINE BESYLATE 10 MG PO TABS
1.0000 | ORAL_TABLET | Freq: Every day | ORAL | 0 refills | Status: DC
Start: 2020-09-25 — End: 2021-04-24

## 2020-09-25 NOTE — Telephone Encounter (Signed)
Patient called and left a detailed voice message that she needs a refill of the following medication sent to [facility]:  amLODipine (NORVASC) 10 MG tablet  Last filled by previous doctor  Last OV 08/29/2020  Next OV 12/05/2020

## 2020-11-07 DIAGNOSIS — D0511 Intraductal carcinoma in situ of right breast: Secondary | ICD-10-CM | POA: Diagnosis not present

## 2020-11-16 IMAGING — MG STEREOTACTIC VACUUM ASSIST RIGHT
6 of 9 series · 6 of 21 positions shown · non-contrast
Comparison: Previous exams.
COMPARISON: Previous exams.

Addendum:
CLINICAL DATA: 73-year-old female for tissue sampling of 0.7 cm
group of calcifications within the OUTER RETROAREOLAR RIGHT breast.

EXAM:
RIGHT BREAST STEREOTACTIC CORE NEEDLE BIOPSY

[R (1 of 5)]
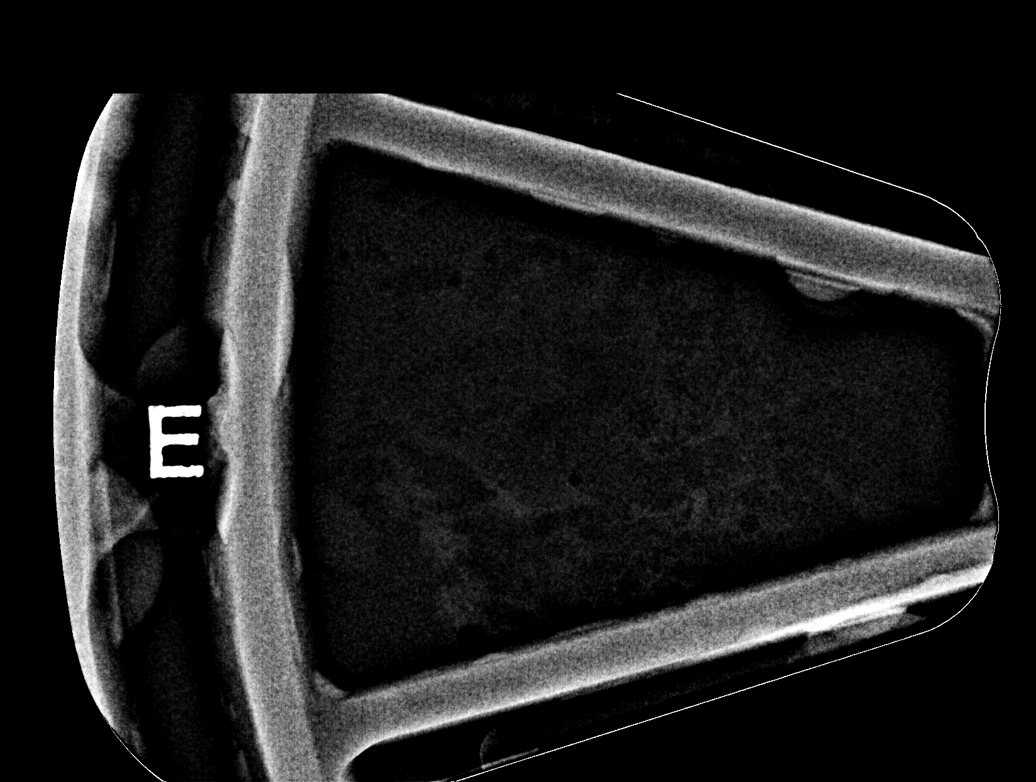

[R (2 of 5)]
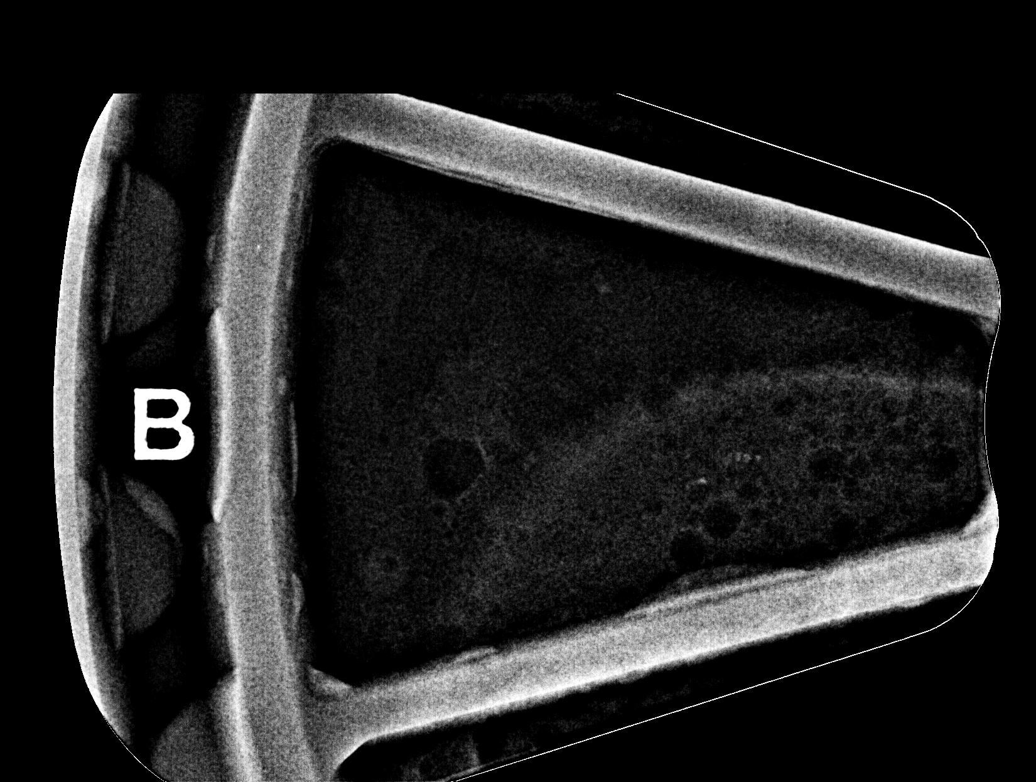

[R (3 of 5)]
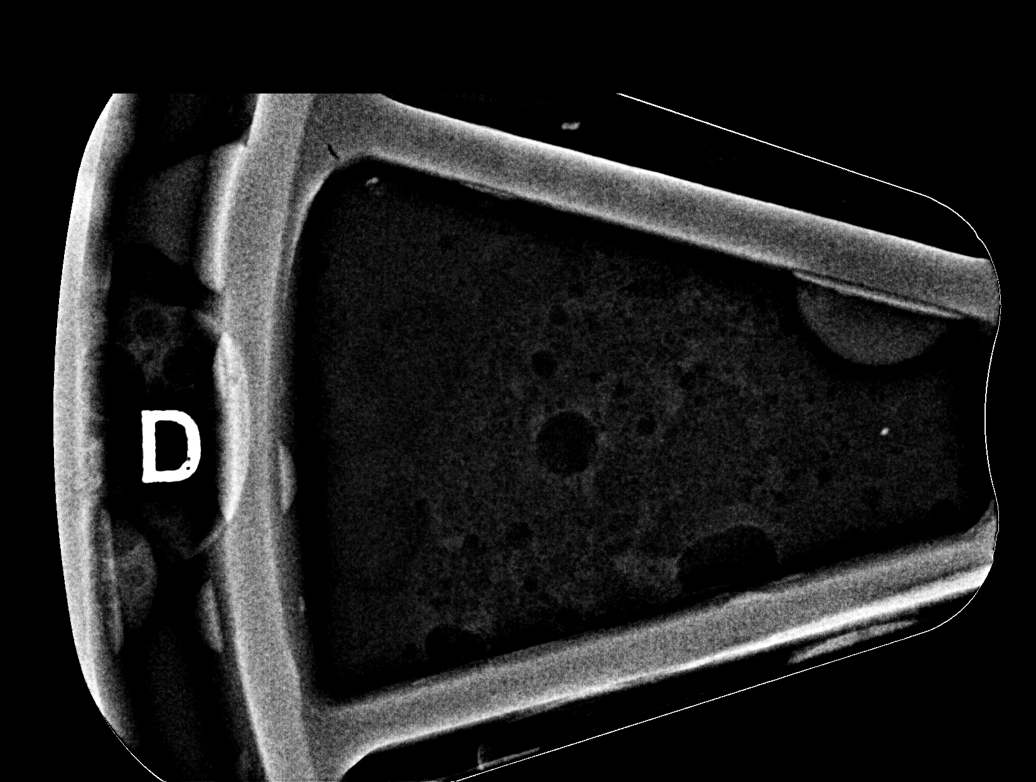

[R (4 of 5)]
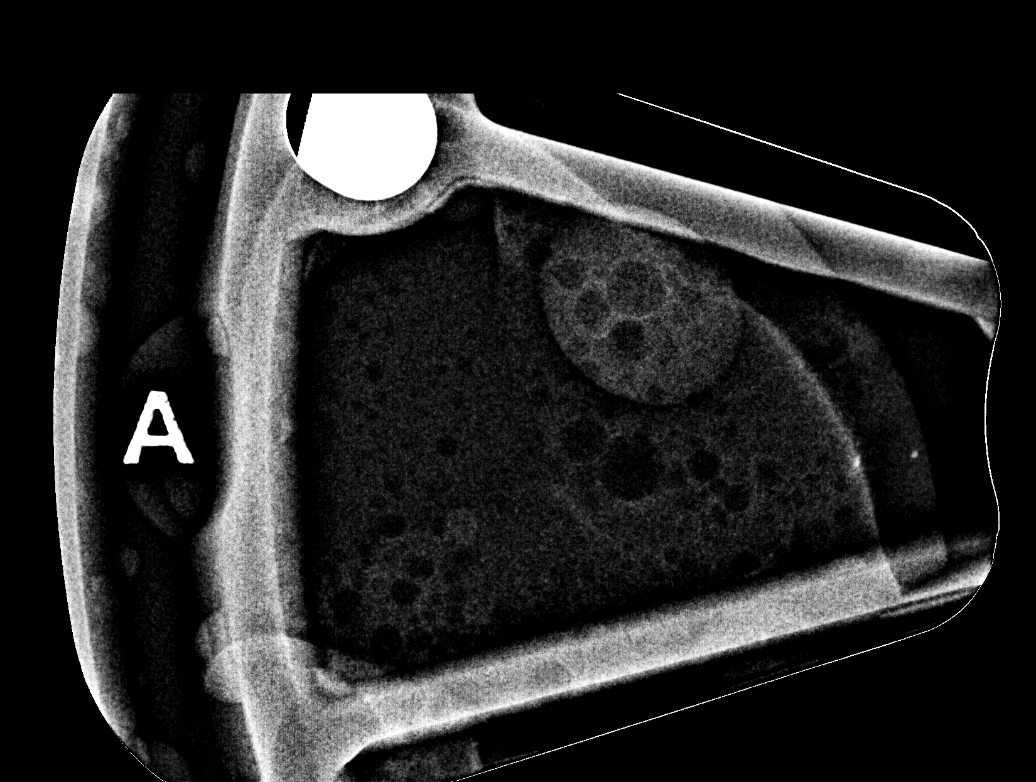

[R (5 of 5)]
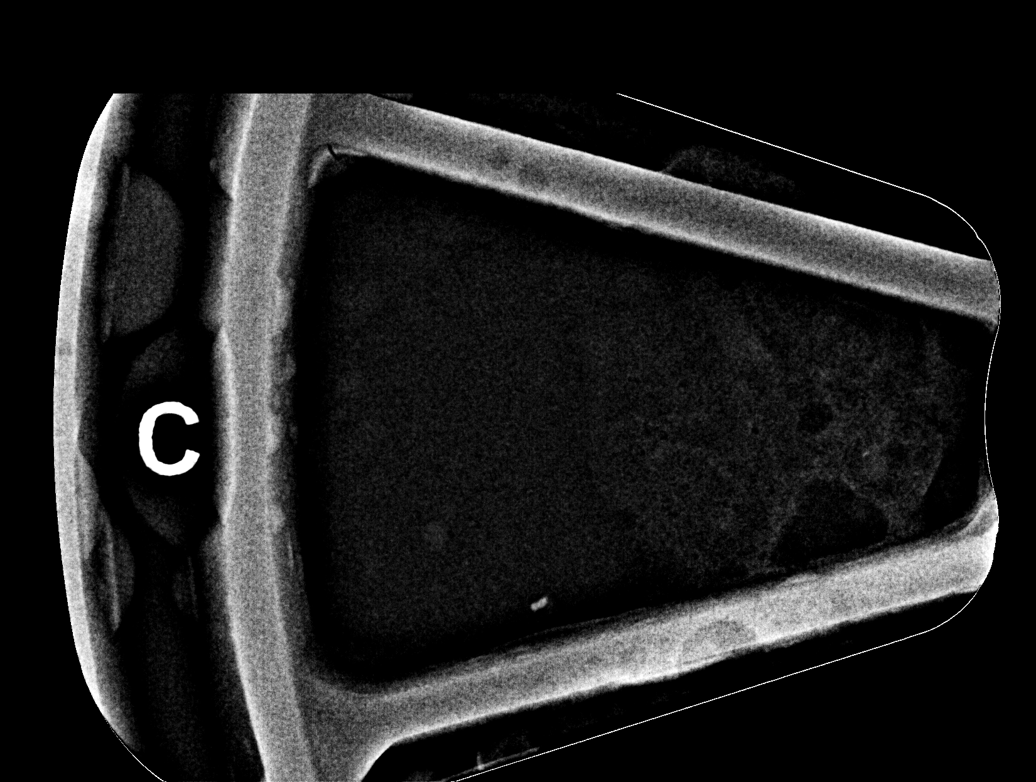

[R LM]
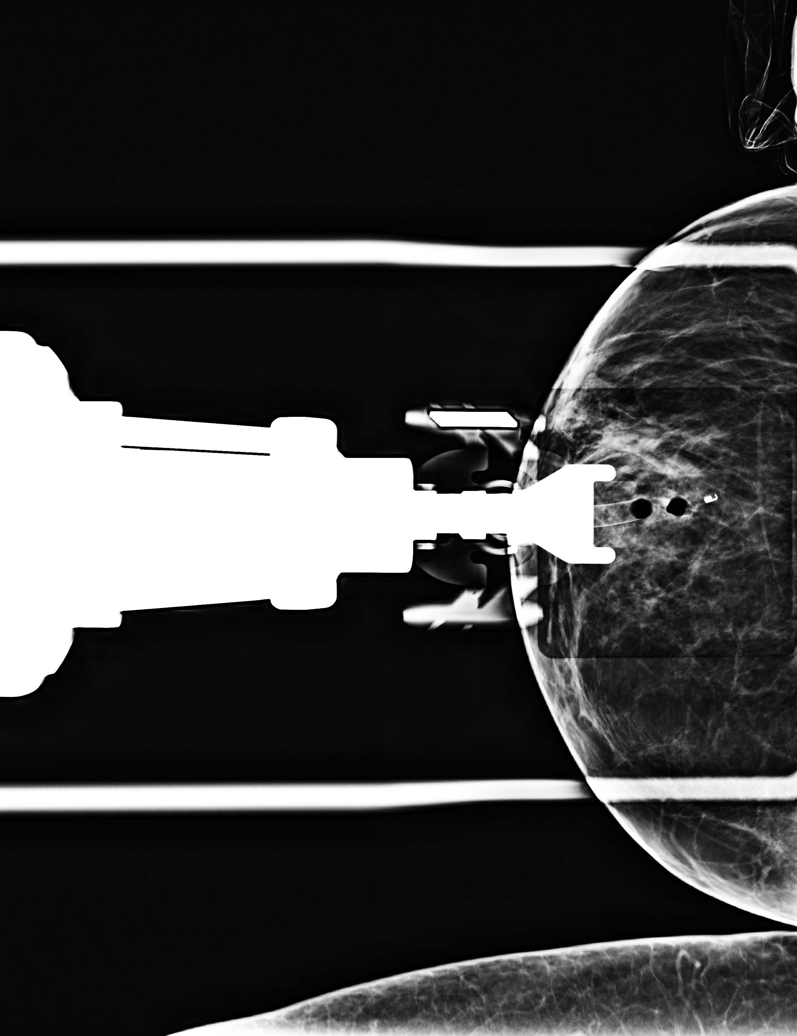

[6 of 21 positions shown; findings below may reference images not displayed]



Using sterile technique and 1% Lidocaine as local anesthetic, under
stereotactic guidance, a 9 gauge vacuum assisted device was used to
perform core needle biopsy of the 0.7 cm group of calcifications
within the OUTER RETROAREOLAR RIGHT breast using a LATERAL approach.
Specimen radiograph was performed showing calcifications. Specimens
with calcifications are identified for pathology.

Lesion quadrant: OUTER RETROAREOLAR RIGHT breast

At the conclusion of the procedure, a COIL tissue marker clip was
deployed into the biopsy cavity. Follow-up 2-view mammogram was
performed and dictated separately.
IMPRESSION: Stereotactic-guided biopsy of OUTER RETROAREOLAR RIGHT breast
calcifications. No apparent complications.

ADDENDUM:
Pathology revealed INTERMEDIATE GRADE DUCTAL CARCINOMA IN SITU WITH
CALCIFICATIONS of the Right breast, outer retroareolar. This was
found to be concordant by Dr. Young Thorson.

Pathology results were discussed with the patient by telephone. The
patient reported doing well after the biopsy with tenderness at the
site. Post biopsy instructions and care were reviewed and questions
were answered. The patient was encouraged to call The [REDACTED]

The patient was referred to [REDACTED]
[REDACTED] at [REDACTED] on
February 10, 2019.

Pathology results reported by Walner Dortch, RN on 02/04/2019.



Using sterile technique and 1% Lidocaine as local anesthetic, under
stereotactic guidance, a 9 gauge vacuum assisted device was used to
perform core needle biopsy of the 0.7 cm group of calcifications
within the OUTER RETROAREOLAR RIGHT breast using a LATERAL approach.
Specimen radiograph was performed showing calcifications. Specimens
with calcifications are identified for pathology.

Lesion quadrant: OUTER RETROAREOLAR RIGHT breast

At the conclusion of the procedure, a COIL tissue marker clip was
deployed into the biopsy cavity. Follow-up 2-view mammogram was
performed and dictated separately.
IMPRESSION: Stereotactic-guided biopsy of OUTER RETROAREOLAR RIGHT breast
calcifications. No apparent complications.

## 2020-11-23 DIAGNOSIS — C50911 Malignant neoplasm of unspecified site of right female breast: Secondary | ICD-10-CM | POA: Diagnosis not present

## 2020-12-04 DIAGNOSIS — H8309 Labyrinthitis, unspecified ear: Secondary | ICD-10-CM | POA: Diagnosis not present

## 2020-12-04 DIAGNOSIS — R110 Nausea: Secondary | ICD-10-CM | POA: Diagnosis not present

## 2020-12-04 DIAGNOSIS — R42 Dizziness and giddiness: Secondary | ICD-10-CM | POA: Diagnosis not present

## 2020-12-05 ENCOUNTER — Other Ambulatory Visit: Payer: Self-pay

## 2020-12-05 ENCOUNTER — Encounter (INDEPENDENT_AMBULATORY_CARE_PROVIDER_SITE_OTHER): Payer: Self-pay | Admitting: Internal Medicine

## 2020-12-05 ENCOUNTER — Ambulatory Visit (INDEPENDENT_AMBULATORY_CARE_PROVIDER_SITE_OTHER): Payer: PPO | Admitting: Internal Medicine

## 2020-12-05 VITALS — BP 114/64 | HR 48 | Temp 97.2°F | Ht 63.0 in | Wt 175.6 lb

## 2020-12-05 DIAGNOSIS — E039 Hypothyroidism, unspecified: Secondary | ICD-10-CM

## 2020-12-05 DIAGNOSIS — I10 Essential (primary) hypertension: Secondary | ICD-10-CM | POA: Diagnosis not present

## 2020-12-05 DIAGNOSIS — E785 Hyperlipidemia, unspecified: Secondary | ICD-10-CM

## 2020-12-05 DIAGNOSIS — E119 Type 2 diabetes mellitus without complications: Secondary | ICD-10-CM | POA: Diagnosis not present

## 2020-12-05 NOTE — Progress Notes (Signed)
Metrics: Intervention Frequency ACO  Documented Smoking Status Yearly  Screened one or more times in 24 months  Cessation Counseling or  Active cessation medication Past 24 months  Past 24 months   Guideline developer: UpToDate (See UpToDate for funding source) Date Released: 2014       Wellness Office Visit  Subjective:  Patient ID: Adrienne [name], female    DOB: [birth year]  Age: 75 y.o. MRN: [id number]  CC: This delightful lady comes in for follow-up of diabetes, hyperlipidemia, hypothyroidism and hypertension. HPI  Unfortunately, she lost her older brother recently.  She went to the urgent care because she was having dizziness.  She was diagnosed with labyrinthitis and was prescribed meclizine and and Zofran.  She has not taken any of these at the present time and I recommend that she do that. She has tolerated the higher dose of NP thyroid 120 mg daily. She has lost significant amount of weight since last time I saw her. She has not specifically changed her nutrition habits.  She does feel well systemically. She continues on antihypertensive medications as before. Past Medical History:  Diagnosis Date   Ambulates with cane    straight cane   Arthritis    knee, back   CHF (congestive heart failure) ([place])    Ductal carcinoma in situ of breast 12/2018   R Breast-mastectomy only   Gout    History of blood transfusion 1986   w/ Hysterectomy surgery   Hyperlipidemia    diet controlled - no meds   Hypertension    Hypothyroidism    Pelvic kidney    lower right pelvic kidney    Prediabetes    diet controlled - no meds   SBO (small bowel obstruction) ([place]) 10/2016   surgery    Sleep apnea    Mild - no mask needed per sleep study   Smoker    quit smoking 2018   Past Surgical History:  Procedure Laterality Date   [place]     pt states it was removed when gallbladder was removed.   BACK SURGERY     lower  back   BREAST LUMPECTOMY WITH RADIOACTIVE SEED LOCALIZATION Right 03/18/2019   Procedure: RIGHT BREAST LUMPECTOMY WITH RADIOACTIVE SEED LOCALIZATION;  Surgeon: Jovita Kussmaul, MD;  Location: [place] [facility];  Service: General;  Laterality: Right;   CHOLECYSTECTOMY     COLONOSCOPY  02/15/2008   [name]    ECTOPIC PREGNANCY SURGERY     JOINT REPLACEMENT     Left hip total Dr. [name] 10-01-17   polypectomy-oropharynx     RE-EXCISION OF BREAST CANCER,SUPERIOR MARGINS Right 04/08/2019   Procedure: RE-EXCISION OF RIGHT BREAST ANTERIOR MARGINS;  Surgeon: [name], MD;  Location: [facility];  Service: General;  Laterality: Right;   right knee meniscus     SBO  10/2016   small bowel obstruction   SIMPLE MASTECTOMY WITH AXILLARY SENTINEL NODE BIOPSY Right 07/26/2019   Procedure: RIGHT MASTECTOMY WITH SENTINEL NODE BIOPSY;  Surgeon: Jovita Kussmaul, MD;  Location: [facility];  Service: General;  Laterality: Right;   TOTAL HIP ARTHROPLASTY Left 10/01/2017   Procedure: LEFT  TOTAL HIP ARTHROPLASTY ANTERIOR APPROACH;  Surgeon: [name], MD;  Location[facility];  Service: Orthopedics;  Laterality: Left;   TOTAL HIP ARTHROPLASTY Right 03/11/2018   Procedure: RIGHT TOTAL HIP ARTHROPLASTY ANTERIOR APPROACH;  Surgeon: [name], MD;  Location[facility];  Service: Orthopedics;  Laterality: Right;  127min   UPPER GASTROINTESTINAL ENDOSCOPY       Family History  Problem Relation Age of Onset   Cancer Sister    Diabetes Brother    Diabetes Brother    Hypertension Other    Breast cancer Sister    Colon cancer Neg Hx    Colon polyps Neg Hx    Rectal cancer Neg Hx    Stomach cancer Neg Hx     Social History   Social History Narrative   Widow since 2006.Married previously for 25 years.Lives with grandaughter and 2 great grandchildren.Retired.Previously office work.   Social History   Tobacco Use   Smoking status: Former    Packs/day: 1.00    Years: 32.00    Pack years: 32.00    Types:  E-cigarettes, Cigarettes    Quit date: 11/20/2016    Years since quitting: 4.0   Smokeless tobacco: Never   Tobacco comments:    Smoker since 1975 has quit and started back several times!!,  Substance Use Topics   Alcohol use: No    Current Meds  Medication Sig   amLODipine (NORVASC) 10 MG tablet Take 1 tablet (10 mg total) by mouth daily.   carvedilol (COREG) 3.125 MG tablet TAKE 1 TABLET BY MOUTH TWICE DAILY WITH A MEAL   Cholecalciferol (VITAMIN D-3) 125 MCG (5000 UT) TABS Take 2 tablets by mouth daily.   Diclofenac-miSOPROStol 50-0.2 MG TBEC Take 1 tablet by mouth 2 (two) times daily.   losartan (COZAAR) 50 MG tablet Take 1 tablet (50 mg total) by mouth daily. Replacing HCTZ   NP THYROID 120 MG tablet Take 1 tablet (120 mg total) by mouth daily before breakfast.     [place] Office Visit from 12/05/2020 in [facility]  PHQ-9 Total Score 0       Objective:   Today's Vitals: BP 114/64   Pulse (!) 48   Temp (!) 97.2 F (36.2 C) (Temporal)   Ht 5\' 3"  (1.6 m)   Wt 175 lb 9.6 oz (79.7 kg)   SpO2 98%   BMI 31.11 kg/m  Vitals with BMI 12/05/2020 08/29/2020 07/21/2020  Height 5\' 3"  5\' 3"  5\' 3"   Weight 175 lbs 10 oz 190 lbs 197 lbs  BMI 31.11 17.00 17.49  Systolic 449 675 916  Diastolic 64 84 86  Pulse 48 64 68     Physical Exam   She looks systemically well.  She has lost 15 pounds since the last visit.  Blood pressure is excellent now.  She is alert and orientated.    Assessment   1. Acquired hypothyroidism   2. Essential hypertension   3. Diabetes mellitus without complication ([place])   4. Dyslipidemia       Tests ordered Orders Placed This Encounter  Procedures   Hemoglobin A1c   Lipid panel   Basic metabolic panel      Plan: 1.  Continue with NP thyroid 120 mg daily.  She has not taken her thyroid medication this morning so I will not check her blood work. 2.  Continue with antihypertensive medications and consider reduction of doses  if her blood pressure maintains at these levels. 3.  Check A1c and lipid panel. 4.  I will see her in September for an annual physical exam.    No orders of the defined types were placed in this encounter.   Doree Albee, MD

## 2020-12-06 LAB — BASIC METABOLIC PANEL
BUN/Creatinine Ratio: 22 (calc) (ref 6–22)
BUN: 23 mg/dL (ref 7–25)
CO2: 26 mmol/L (ref 20–32)
Calcium: 9.8 mg/dL (ref 8.6–10.4)
Chloride: 105 mmol/L (ref 98–110)
Creat: 1.05 mg/dL — ABNORMAL HIGH (ref 0.60–0.93)
Glucose, Bld: 101 mg/dL — ABNORMAL HIGH (ref 65–99)
Potassium: 4.2 mmol/L (ref 3.5–5.3)
Sodium: 140 mmol/L (ref 135–146)

## 2020-12-06 LAB — LIPID PANEL
Cholesterol: 218 mg/dL — ABNORMAL HIGH (ref <200)
HDL: 45 mg/dL — ABNORMAL LOW (ref >=50)
LDL Cholesterol (Calc): 146 mg/dL (calc) — ABNORMAL HIGH
Non-HDL Cholesterol (Calc): 173 mg/dL (calc) — ABNORMAL HIGH (ref <130)
Total CHOL/HDL Ratio: 4.8 (calc) (ref <5.0)
Triglycerides: 141 mg/dL (ref <150)

## 2020-12-06 LAB — HEMOGLOBIN A1C
Hgb A1c MFr Bld: 6.1 % of total Hgb — ABNORMAL HIGH (ref <5.7)
Mean Plasma Glucose: 128 mg/dL
eAG (mmol/L): 7.1 mmol/L

## 2020-12-14 DIAGNOSIS — C50911 Malignant neoplasm of unspecified site of right female breast: Secondary | ICD-10-CM | POA: Diagnosis not present

## 2020-12-18 ENCOUNTER — Other Ambulatory Visit: Payer: Self-pay | Admitting: Family Medicine

## 2021-01-09 ENCOUNTER — Other Ambulatory Visit (INDEPENDENT_AMBULATORY_CARE_PROVIDER_SITE_OTHER): Payer: Self-pay | Admitting: Internal Medicine

## 2021-01-17 ENCOUNTER — Telehealth (INDEPENDENT_AMBULATORY_CARE_PROVIDER_SITE_OTHER): Payer: Self-pay

## 2021-01-17 DIAGNOSIS — R42 Dizziness and giddiness: Secondary | ICD-10-CM

## 2021-01-17 MED ORDER — MECLIZINE HCL 25 MG PO TABS: 25.0000 mg | ORAL_TABLET | Freq: Three times a day (TID) | ORAL | 0 refills | Status: DC | PRN

## 2021-01-17 NOTE — Telephone Encounter (Signed)
I will send a refill to the [facility] in [place].  Please make sure that she comes to her next follow-up so that we can make sure the dizziness is getting better and is not persistent.  I do see where she saw Dr. Anastasio Champion and was evaluated for her dizziness after being evaluated in the urgent care so I think it is fairly reasonable to do a refill today as long as she promises to follow-up as scheduled in September so we can make sure things are improving and not persisting.  Thank you.

## 2021-01-17 NOTE — Telephone Encounter (Signed)
Patient called and stated that she had some dizziness and was seen at urgent care in June and they gave her meclizine and it helped some and she is out of this medication. Patient stated that she has dizziness and it comes and goes and is asking if she can have more of the Meclizine or if she needs to be seen again? Please advise.

## 2021-01-17 NOTE — Telephone Encounter (Signed)
Called patient and left a detailed voice message regarding the message from Judson Roch and advised patient to call us if her dizziness does not improve.

## 2021-01-30 ENCOUNTER — Other Ambulatory Visit: Payer: Self-pay | Admitting: General Surgery

## 2021-01-30 DIAGNOSIS — Z1231 Encounter for screening mammogram for malignant neoplasm of breast: Secondary | ICD-10-CM

## 2021-02-01 DIAGNOSIS — H524 Presbyopia: Secondary | ICD-10-CM | POA: Diagnosis not present

## 2021-02-01 DIAGNOSIS — H2513 Age-related nuclear cataract, bilateral: Secondary | ICD-10-CM | POA: Diagnosis not present

## 2021-02-01 DIAGNOSIS — H25013 Cortical age-related cataract, bilateral: Secondary | ICD-10-CM | POA: Diagnosis not present

## 2021-02-01 DIAGNOSIS — H04213 Epiphora due to excess lacrimation, bilateral lacrimal glands: Secondary | ICD-10-CM | POA: Diagnosis not present

## 2021-02-01 DIAGNOSIS — H40023 Open angle with borderline findings, high risk, bilateral: Secondary | ICD-10-CM | POA: Diagnosis not present

## 2021-02-20 ENCOUNTER — Ambulatory Visit
Admission: RE | Admit: 2021-02-20 | Discharge: 2021-02-20 | Disposition: A | Discharge status: Home / Self Care | Payer: PPO | Source: Ambulatory Visit | Attending: General Surgery | Admitting: General Surgery

## 2021-02-20 ENCOUNTER — Other Ambulatory Visit: Payer: Self-pay | Admitting: Orthopedic Surgery

## 2021-02-20 ENCOUNTER — Ambulatory Visit: Payer: PPO

## 2021-02-20 ENCOUNTER — Other Ambulatory Visit: Payer: Self-pay

## 2021-02-20 DIAGNOSIS — Z1231 Encounter for screening mammogram for malignant neoplasm of breast: Secondary | ICD-10-CM

## 2021-02-27 ENCOUNTER — Telehealth: Payer: Self-pay | Admitting: *Deleted

## 2021-02-27 NOTE — Telephone Encounter (Signed)
   [facility] Pre-operative Risk Assessment    Patient Name: Adrienne [name]  DOB: [birth year] MRN: [id number]  HEARTCARE STAFF:  - IMPORTANT!!!!!! Under Visit Info/Reason for Call, type in Other and utilize the format Clearance MM/DD/YY or Clearance TBD. Do not use dashes or single digits. - Please review there is not already an duplicate clearance open for this procedure. - If request is for dental extraction, please clarify the # of teeth to be extracted. - If the patient is currently at the dentist's office, call Pre-Op Callback Staff (MA/nurse) to input urgent request.  - If the patient is not currently in the dentist office, please route to the Pre-Op pool.  Request for surgical clearance:  What type of surgery is being performed? 2 TEETH SURGICALLY EXTRACTED PER DENTAL OFFICE   When is this surgery scheduled? TBD  What type of clearance is required (medical clearance vs. Pharmacy clearance to hold med vs. Both)? MEDICAL  Are there any medications that need to be held prior to surgery and how long?  NONE LISTED   Practice name and name of physician performing surgery? [facility]; DR. Tamela Oddi, DDS  What is the office phone number? [phone]   7.   What is the office fax number? [phone]  8.   Anesthesia type (None, local, MAC, general) ?  IV ANESTHESIA  (VERSED and FENTANYL) PER DDS ALSO USING EXPAREL    Julaine Hua 02/27/2021, 10:37 AM  _________________________________________________________________   (provider comments below)

## 2021-02-27 NOTE — Telephone Encounter (Signed)
   Patient Name: Adrienne [name]  DOB: 03/13/1946 MRN: [id number]  Primary Cardiologist: Minus Breeding, MD  Chart reviewed as part of pre-operative protocol coverage.   Simple dental extractions (1-2 teeth) are considered low risk procedures per guidelines and generally do not require any specific cardiac clearance. It is also generally accepted that for simple extractions and dental cleanings, there is no need to interrupt blood thinner therapy.   SBE prophylaxis is not required for the patient from a cardiac standpoint.  I will route this recommendation to the requesting party via Epic fax function and remove from pre-op pool.  Please call with questions.  [place], PA 02/27/2021, 12:15 PM

## 2021-02-28 ENCOUNTER — Encounter: Payer: Self-pay | Admitting: Gastroenterology

## 2021-03-01 NOTE — Telephone Encounter (Signed)
Office Called back to say they are missing labs for patient if you could please send that information over. Please advise

## 2021-03-20 ENCOUNTER — Encounter (INDEPENDENT_AMBULATORY_CARE_PROVIDER_SITE_OTHER): Payer: PPO | Admitting: Internal Medicine

## 2021-03-23 DIAGNOSIS — Z760 Encounter for issue of repeat prescription: Secondary | ICD-10-CM | POA: Diagnosis not present

## 2021-04-24 ENCOUNTER — Ambulatory Visit (INDEPENDENT_AMBULATORY_CARE_PROVIDER_SITE_OTHER): Payer: PPO | Admitting: Internal Medicine

## 2021-04-24 ENCOUNTER — Encounter: Payer: Self-pay | Admitting: Internal Medicine

## 2021-04-24 ENCOUNTER — Other Ambulatory Visit: Payer: Self-pay

## 2021-04-24 VITALS — BP 132/84 | HR 50 | Temp 98.2°F | Resp 18 | Ht 63.0 in | Wt 178.1 lb

## 2021-04-24 DIAGNOSIS — E559 Vitamin D deficiency, unspecified: Secondary | ICD-10-CM

## 2021-04-24 DIAGNOSIS — E039 Hypothyroidism, unspecified: Secondary | ICD-10-CM

## 2021-04-24 DIAGNOSIS — H6123 Impacted cerumen, bilateral: Secondary | ICD-10-CM | POA: Diagnosis not present

## 2021-04-24 DIAGNOSIS — E782 Mixed hyperlipidemia: Secondary | ICD-10-CM | POA: Diagnosis not present

## 2021-04-24 DIAGNOSIS — Z853 Personal history of malignant neoplasm of breast: Secondary | ICD-10-CM | POA: Diagnosis not present

## 2021-04-24 DIAGNOSIS — Z1211 Encounter for screening for malignant neoplasm of colon: Secondary | ICD-10-CM | POA: Diagnosis not present

## 2021-04-24 DIAGNOSIS — Z23 Encounter for immunization: Secondary | ICD-10-CM | POA: Diagnosis not present

## 2021-04-24 DIAGNOSIS — I10 Essential (primary) hypertension: Secondary | ICD-10-CM

## 2021-04-24 DIAGNOSIS — I428 Other cardiomyopathies: Secondary | ICD-10-CM

## 2021-04-24 DIAGNOSIS — M1712 Unilateral primary osteoarthritis, left knee: Secondary | ICD-10-CM

## 2021-04-24 MED ORDER — CARVEDILOL 3.125 MG PO TABS
3.1250 mg | ORAL_TABLET | Freq: Two times a day (BID) | ORAL | 1 refills | Status: DC
Start: 2021-04-24 — End: 2021-11-27

## 2021-04-24 MED ORDER — DICLOFENAC-MISOPROSTOL 50-0.2 MG PO TBEC: 1.0000 | DELAYED_RELEASE_TABLET | Freq: Two times a day (BID) | ORAL | 0 refills | Status: DC | PRN

## 2021-04-24 MED ORDER — AMLODIPINE BESYLATE 10 MG PO TABS: 10.0000 mg | ORAL_TABLET | Freq: Every day | ORAL | 1 refills | Status: DC

## 2021-04-24 MED ORDER — LOSARTAN POTASSIUM 50 MG PO TABS: 50.0000 mg | ORAL_TABLET | Freq: Every day | ORAL | 1 refills | Status: DC

## 2021-04-24 MED ORDER — DEBROX 6.5 % OT SOLN: 5.0000 [drp] | Freq: Two times a day (BID) | OTIC | 0 refills | Status: DC

## 2021-04-24 MED ORDER — NP THYROID 120 MG PO TABS: 120.0000 mg | ORAL_TABLET | Freq: Every day | ORAL | 2 refills | Status: DC

## 2021-04-24 NOTE — Assessment & Plan Note (Signed)
On Coreg and Losartan Followed by Cardiology

## 2021-04-24 NOTE — Progress Notes (Signed)
New Patient Office Visit  Subjective:  Patient ID: Adrienne [name], female    DOB: October 19, 1945  Age: 75 y.o. MRN: [id number]  CC:  Chief Complaint  Patient presents with   New Patient (Initial Visit)    New patient was seeing dr Anastasio Champion    HPI Adrienne [name] is a 75 year old female with PMH of HTN, nonishcemic CM, hypothyroidism, OA of hip and knee, HLD, breast ca. s/p right mastectomy who presents for establishing care.  HTN: BP is well-controlled. Takes medications regularly. Patient denies headache, dizziness, chest pain or dyspnea. She follows up with Dr Percival Spanish for h/o nonischemic cardiomyopathy.  She has h/o hypothyroidism, for which she takes NP thyroid. I had discussion about switching to Levothyroxine, which she used to take in the past. But she prefers NP thyroid for now. She denies any recent change in weight or appetite.  She has h/o OA of hip and knee. She c/o chronic knee pain, for which she takes Diclofenac. She requests a refill of it.  She received flu vaccine in the office today.  Past Medical History:  Diagnosis Date   Ambulates with cane    straight cane   Arthritis    knee, back   CHF (congestive heart failure) ([place])    Ductal carcinoma in situ of breast 12/2018   R Breast-mastectomy only   Gout    History of blood transfusion 1986   w/ Hysterectomy surgery   Hyperlipidemia    diet controlled - no meds   Hypertension    Hypothyroidism    Pelvic kidney    lower right pelvic kidney    Prediabetes    diet controlled - no meds   SBO (small bowel obstruction) ([place]) 10/2016   surgery    Sleep apnea    Mild - no mask needed per sleep study   Smoker    quit smoking 2018    Past Surgical History:  Procedure Laterality Date   [place]     pt states it was removed when gallbladder was removed.   BACK SURGERY     lower back   BREAST LUMPECTOMY WITH RADIOACTIVE SEED  LOCALIZATION Right 03/18/2019   Procedure: RIGHT BREAST LUMPECTOMY WITH RADIOACTIVE SEED LOCALIZATION;  Surgeon: Jovita Kussmaul, MD;  Location: [facility];  Service: General;  Laterality: Right;   CHOLECYSTECTOMY     COLONOSCOPY  02/15/2008   [name]    ECTOPIC PREGNANCY SURGERY     JOINT REPLACEMENT     Left hip total Dr. [name] 10-01-17   polypectomy-oropharynx     RE-EXCISION OF BREAST CANCER,SUPERIOR MARGINS Right 04/08/2019   Procedure: RE-EXCISION OF RIGHT BREAST ANTERIOR MARGINS;  Surgeon: [name], MD;  [facility];  Service: General;  Laterality: Right;   right knee meniscus     SBO  10/2016   small bowel obstruction   SIMPLE MASTECTOMY WITH AXILLARY SENTINEL NODE BIOPSY Right 07/26/2019   Procedure: RIGHT MASTECTOMY WITH SENTINEL NODE BIOPSY;  Surgeon: Jovita Kussmaul, MD;  Location: [facility];  Service: General;  Laterality: Right;   TOTAL HIP ARTHROPLASTY Left 10/01/2017   Procedure: LEFT  TOTAL HIP ARTHROPLASTY ANTERIOR APPROACH;  Surgeon: [name], MD;  [facility];  Service: Orthopedics;  Laterality: Left;   TOTAL HIP ARTHROPLASTY Right 03/11/2018   Procedure: RIGHT TOTAL HIP ARTHROPLASTY ANTERIOR APPROACH;  Surgeon: [name], MD;  [facility];  Service: Orthopedics;  Laterality: Right;  133mn   UPPER GASTROINTESTINAL ENDOSCOPY      Family History  Problem Relation Age of Onset   Cancer Sister    Diabetes Brother    Diabetes Brother    Hypertension Other    Breast cancer Sister    Colon cancer Neg Hx    Colon polyps Neg Hx    Rectal cancer Neg Hx    Stomach cancer Neg Hx     Social History   Socioeconomic History   Marital status: Widowed    Spouse name: Not on file   Number of children: Not on file   Years of education: Not on file   Highest education level: Not on file  Occupational History   Occupation: retired  Tobacco Use   Smoking status: Former    Packs/day: 1.00    Years: 32.00    Pack years: 32.00     Types: E-cigarettes, Cigarettes    Quit date: 11/20/2016    Years since quitting: 4.4   Smokeless tobacco: Never   Tobacco comments:    Smoker since 1975 has quit and started back several times!!,  Vaping Use   Vaping Use: Former  Substance and Sexual Activity   Alcohol use: No   Drug use: No   Sexual activity: Not Currently    Birth control/protection: Surgical    Comment: Hysterectomy  Other Topics Concern   Not on file  Social History Narrative   Widow since 2006.Married previously for 25 years.Lives with grandaughter and 2 great grandchildren.Retired.Previously office work.   Social Determinants of Health   Financial Resource Strain: Not on file  Food Insecurity: Not on file  Transportation Needs: Not on file  Physical Activity: Not on file  Stress: Not on file  Social Connections: Not on file  Intimate Partner Violence: Not on file    ROS Review of Systems  Constitutional:  Negative for chills and fever.  HENT:  Negative for congestion, sinus pressure, sinus pain and sore throat.   Eyes:  Negative for pain and discharge.  Respiratory:  Negative for cough and shortness of breath.   Cardiovascular:  Negative for chest pain and palpitations.  Gastrointestinal:  Negative for abdominal pain, constipation, diarrhea, nausea and vomiting.  Endocrine: Negative for polydipsia and polyuria.  Genitourinary:  Negative for dysuria and hematuria.  Musculoskeletal:  Positive for arthralgias. Negative for neck pain and neck stiffness.  Skin:  Negative for rash.  Neurological:  Negative for dizziness and weakness.  Psychiatric/Behavioral:  Negative for agitation and behavioral problems.    Objective:   Today's Vitals: BP 132/84 (BP Location: Left Arm, Cuff Size: Normal)   Pulse (!) 50   Temp 98.2 F (36.8 C) (Oral)   Resp 18   Ht '5\' 3"'  (1.6 m)   Wt 178 lb 1.3 oz (80.8 kg)   SpO2 92%   BMI 31.55 kg/m   Physical Exam Vitals reviewed.  Constitutional:      General: She is  not in acute distress.    Appearance: She is not diaphoretic.  HENT:     Head: Normocephalic and atraumatic.     Nose: Nose normal.     Mouth/Throat:     Mouth: Mucous membranes are moist.  Eyes:     General: No scleral icterus.    Extraocular Movements: Extraocular movements intact.  Cardiovascular:     Rate and Rhythm: Normal rate and regular rhythm.     Pulses: Normal pulses.     Heart sounds: Normal heart sounds. No  murmur heard. Pulmonary:     Breath sounds: Normal breath sounds. No wheezing or rales.  Musculoskeletal:     Cervical back: Neck supple. No tenderness.     Right lower leg: No edema.     Left lower leg: No edema.  Skin:    General: Skin is warm.     Findings: No rash.  Neurological:     General: No focal deficit present.     Mental Status: She is alert and oriented to person, place, and time.  Psychiatric:        Mood and Affect: Mood normal.        Behavior: Behavior normal.    Assessment & Plan:   Problem List Items Addressed This Visit       Cardiovascular and Mediastinum   Hypertension - Primary    BP Readings from Last 1 Encounters:  04/24/21 132/84  Well-controlled with Amlodipine, Losartan and Coreg Counseled for compliance with the medications Advised DASH diet and moderate exercise/walking, at least 150 mins/week       Relevant Medications   amLODipine (NORVASC) 10 MG tablet   carvedilol (COREG) 3.125 MG tablet   losartan (COZAAR) 50 MG tablet   Other Relevant Orders   CBC with Differential/Platelet   CMP14+EGFR   Nonischemic cardiomyopathy (H[facility])    On Coreg and Losartan Followed by Cardiology      Relevant Medications   amLODipine (NORVASC) 10 MG tablet   carvedilol (COREG) 3.125 MG tablet   losartan (COZAAR) 50 MG tablet     Endocrine   Hypothyroidism    Lab Results  Component Value Date   TSH 0.87 08/29/2020  On NP thyroid 120 mg QD Discussed about switching to Levothyroxine, she would prefer to stay on NP thyroid for  now Check TSH and free T4      Relevant Medications   carvedilol (COREG) 3.125 MG tablet   NP THYROID 120 MG tablet   Other Relevant Orders   TSH + free T4     Other   HLD (hyperlipidemia)    Last lipid profile reviewed Needs to follow low cholesterol diet Will check lipid profile later      Relevant Medications   amLODipine (NORVASC) 10 MG tablet   carvedilol (COREG) 3.125 MG tablet   losartan (COZAAR) 50 MG tablet   History of breast cancer    DCIS of right breast, s/p mastectomy      Other Visit Diagnoses     Special screening for malignant neoplasms, colon       Relevant Orders   Ambulatory referral to Gastroenterology   Primary osteoarthritis of left knee       Relevant Medications   Diclofenac-miSOPROStol 50-0.2 MG TBEC   Vitamin D deficiency       Relevant Orders   Vitamin D (25 hydroxy)   Bilateral impacted cerumen       Relevant Medications   carbamide peroxide (DEBROX) 6.5 % OTIC solution   Need for immunization against influenza       Relevant Orders   Flu Vaccine QUAD High Dose(Fluad) (Completed)       Outpatient Encounter Medications as of 04/24/2021  Medication Sig   carbamide peroxide (DEBROX) 6.5 % OTIC solution Place 5 drops into both ears 2 (two) times daily.   Cholecalciferol (VITAMIN D-3) 125 MCG (5000 UT) TABS Take 2 tablets by mouth daily.   [DISCONTINUED] amLODipine (NORVASC) 10 MG tablet Take 1 tablet (10 mg total) by mouth daily.   [DISCONTINUED]  carvedilol (COREG) 3.125 MG tablet TAKE 1 TABLET BY MOUTH TWICE DAILY WITH A MEAL   [DISCONTINUED] Diclofenac-miSOPROStol 50-0.2 MG TBEC Take 1 tablet by mouth twice daily   [DISCONTINUED] losartan (COZAAR) 50 MG tablet Take 1 tablet (50 mg total) by mouth daily. Replacing HCTZ   [DISCONTINUED] NP THYROID 120 MG tablet Take 1 tablet (120 mg total) by mouth daily before breakfast.   amLODipine (NORVASC) 10 MG tablet Take 1 tablet (10 mg total) by mouth daily.   carvedilol (COREG) 3.125 MG tablet  Take 1 tablet (3.125 mg total) by mouth 2 (two) times daily with a meal.   Diclofenac-miSOPROStol 50-0.2 MG TBEC Take 1 tablet by mouth 2 (two) times daily between meals as needed.   losartan (COZAAR) 50 MG tablet Take 1 tablet (50 mg total) by mouth daily.   NP THYROID 120 MG tablet Take 1 tablet (120 mg total) by mouth daily before breakfast.   [DISCONTINUED] meclizine (ANTIVERT) 25 MG tablet Take 1 tablet (25 mg total) by mouth 3 (three) times daily as needed for dizziness. (Patient not taking: Reported on 04/24/2021)   [DISCONTINUED] predniSONE (DELTASONE) 20 MG tablet Take 2 tablets (40 mg total) by mouth daily with breakfast. (Patient not taking: No sig reported)   No facility-administered encounter medications on file as of 04/24/2021.    Follow-up: Return in about 4 months (around 08/22/2021) for Annual physical.   Lindell Spar, MD

## 2021-04-24 NOTE — Assessment & Plan Note (Signed)
Advised to prefer Tylenol Diclofenac PRN for moderate pain

## 2021-04-24 NOTE — Assessment & Plan Note (Signed)
BP Readings from Last 1 Encounters:  04/24/21 132/84   Well-controlled with Amlodipine, Losartan and Coreg Counseled for compliance with the medications Advised DASH diet and moderate exercise/walking, at least 150 mins/week

## 2021-04-24 NOTE — Assessment & Plan Note (Signed)
Lab Results  Component Value Date   TSH 0.87 08/29/2020   On NP thyroid 120 mg QD Discussed about switching to Levothyroxine, she would prefer to stay on NP thyroid for now Check TSH and free T4

## 2021-04-24 NOTE — Assessment & Plan Note (Signed)
Last lipid profile reviewed Needs to follow low cholesterol diet Will check lipid profile later 

## 2021-04-24 NOTE — Assessment & Plan Note (Signed)
DCIS of right breast, s/p mastectomy

## 2021-04-24 NOTE — Patient Instructions (Signed)
Please continue taking medications as prescribed.  Please continue to follow low salt diet and ambulate as tolerated.  Please avoid taking Diclofenac on daily basis, prefer to take Tylenol instead.  Please use Debrox ear drop for ear wax. If you have persistent ringing in the ears or ear pain or discharge, please contact us.

## 2021-04-25 LAB — CBC WITH DIFFERENTIAL/PLATELET
Basophils Absolute: 0 10*3/uL (ref 0.0–0.2)
Basos: 1 %
EOS (ABSOLUTE): 0.1 10*3/uL (ref 0.0–0.4)
Eos: 1 %
Hematocrit: 40.7 % (ref 34.0–46.6)
Hemoglobin: 13.1 g/dL (ref 11.1–15.9)
Immature Grans (Abs): 0 10*3/uL (ref 0.0–0.1)
Immature Granulocytes: 0 %
Lymphocytes Absolute: 3 10*3/uL (ref 0.7–3.1)
Lymphs: 34 %
MCH: 29 pg (ref 26.6–33.0)
MCHC: 32.2 g/dL (ref 31.5–35.7)
MCV: 90 fL (ref 79–97)
Monocytes Absolute: 0.6 10*3/uL (ref 0.1–0.9)
Monocytes: 6 %
Neutrophils Absolute: 5 10*3/uL (ref 1.4–7.0)
Neutrophils: 58 %
Platelets: 283 10*3/uL (ref 150–450)
RBC: 4.51 x10E6/uL (ref 3.77–5.28)
RDW: 13.2 % (ref 11.7–15.4)
WBC: 8.7 10*3/uL (ref 3.4–10.8)

## 2021-04-25 LAB — CMP14+EGFR
ALT: 45 IU/L — ABNORMAL HIGH (ref 0–32)
AST: 29 IU/L (ref 0–40)
Albumin/Globulin Ratio: 1.4 (ref 1.2–2.2)
Albumin: 4.2 g/dL (ref 3.7–4.7)
Alkaline Phosphatase: 119 IU/L (ref 44–121)
BUN/Creatinine Ratio: 18 (ref 12–28)
BUN: 15 mg/dL (ref 8–27)
Bilirubin Total: 0.3 mg/dL (ref 0.0–1.2)
CO2: 25 mmol/L (ref 20–29)
Calcium: 10.2 mg/dL (ref 8.7–10.3)
Chloride: 104 mmol/L (ref 96–106)
Creatinine, Ser: 0.85 mg/dL (ref 0.57–1.00)
Globulin, Total: 2.9 g/dL (ref 1.5–4.5)
Glucose: 100 mg/dL — ABNORMAL HIGH (ref 70–99)
Potassium: 4.8 mmol/L (ref 3.5–5.2)
Sodium: 141 mmol/L (ref 134–144)
Total Protein: 7.1 g/dL (ref 6.0–8.5)
eGFR: 72 mL/min/{1.73_m2} (ref >59)

## 2021-04-25 LAB — TSH+FREE T4
Free T4: 1.56 ng/dL (ref 0.82–1.77)
TSH: <0.005 u[IU]/mL — ABNORMAL LOW (ref 0.450–4.500)

## 2021-04-25 LAB — VITAMIN D 25 HYDROXY (VIT D DEFICIENCY, FRACTURES): Vit D, 25-Hydroxy: 85.4 ng/mL (ref 30.0–100.0)

## 2021-05-22 ENCOUNTER — Other Ambulatory Visit: Payer: Self-pay

## 2021-05-22 ENCOUNTER — Encounter: Payer: Self-pay | Admitting: Nurse Practitioner

## 2021-05-22 ENCOUNTER — Ambulatory Visit (INDEPENDENT_AMBULATORY_CARE_PROVIDER_SITE_OTHER): Payer: PPO | Admitting: Nurse Practitioner

## 2021-05-22 VITALS — BP 133/71 | HR 44 | Ht 63.0 in | Wt 177.0 lb

## 2021-05-22 DIAGNOSIS — E039 Hypothyroidism, unspecified: Secondary | ICD-10-CM | POA: Diagnosis not present

## 2021-05-22 DIAGNOSIS — I10 Essential (primary) hypertension: Secondary | ICD-10-CM

## 2021-05-22 DIAGNOSIS — M1712 Unilateral primary osteoarthritis, left knee: Secondary | ICD-10-CM | POA: Diagnosis not present

## 2021-05-22 NOTE — Assessment & Plan Note (Addendum)
Pt stated that she has been taking half a tablet of NP Thyroid 60mg  daily Lab Results  Component Value Date   TSH <0.005 (L) 04/24/2021  Recheck TSH level next visit

## 2021-05-22 NOTE — Assessment & Plan Note (Signed)
BP Readings from Last 3 Encounters:  05/22/21 133/71  04/24/21 132/84  12/05/20 114/64  continue current medications

## 2021-05-22 NOTE — Progress Notes (Signed)
   Adrienne [name]     MRN: [id number]      DOB: [birth year]   HPI Adrienne [name] is here for acute visit.  Preventive health is updated, specifically  Cancer screening and Immunization. PT wants to have her colonoscopy done at [facility] instead of [facility].  Questions or concerns regarding consultations or procedures which the PT has had in the interim are  addressed. The PT denies any adverse reactions to current medications since the last visit.    Pt c/o pain in her left and right heels,ankles extending to both her knees. Pain started in her left feet on 11/25 but now extending to her  her right feet and knee. PT stated that took diclofenac-misoprostol one time, Tylenol is not helping her pain. Pain is worse she is moving around around , has aching pain 7/10. She has always had knee pain, diclofenac was helping but she was told not to use it because of her kidney function. PT has an upcoming apt for US arterial ABI on Dec.13. PT denies numbness, tingling, swelling     ROS Denies recent fever or chills. Denies sinus pressure, nasal congestion, ear pain or sore throat. Denies chest congestion, productive cough or wheezing. Denies chest pains, palpitations and leg swelling Denies abdominal pain, nausea, vomiting,diarrhea or constipation.   Denies dysuria, frequency, hesitancy or incontinence. Has bilateral foot, ankle and knee joint pain, No swelling and limitation in mobility. Denies headaches, seizures, numbness, or tingling. Denies depression, anxiety or insomnia. Denies skin break down or rash.   PE  BP 133/71 (BP Location: Left Arm, Patient Position: Sitting, Cuff Size: Normal)   Pulse (!) 44   Ht 5\' 3"  (1.6 m)   Wt 177 lb (80.3 kg)   SpO2 94%   BMI 31.35 kg/m   Patient alert and oriented and in no cardiopulmonary distress.  HEENT: No facial asymmetry, EOMI,     Neck supple .  Chest: Clear to auscultation bilaterally.  CVS: S1, S2 no murmurs, no  S3.Regular rate.  ABD: Soft non tender.   Ext: No edema  MS: Adequate ROM spine, shoulders, hips and knees, no tenderness on palpation of the knees and ankle joints.   Skin: Intact, no ulcerations or rash noted.  Psych: Good eye contact, normal affect. Memory intact not anxious or depressed appearing.  CNS: CN 2-12 intact, power,  normal throughout.no focal deficits noted.   Assessment & Plan

## 2021-05-22 NOTE — Patient Instructions (Addendum)
It is ok for you take your diclofenac tablets once daily as needed for your knee pain. Follow up with vascular doctor for the ABI study of your lower extremities  in Dec. Please get your covid vacine at your pharmacy.    t is important that you exercise regularly at least 30 minutes 5 times a week.  Think about what you will eat, plan ahead. Choose " clean, green, fresh or frozen" over canned, processed or packaged foods which are more sugary, salty and fatty. 70 to 75% of food eaten should be vegetables and fruit. Three meals at set times with snacks allowed between meals, but they must be fruit or vegetables. Aim to eat over a 12 hour period , example 7 am to 7 pm, and STOP after  your last meal of the day. Drink water,generally about 64 ounces per day, no other drink is as healthy. Fruit juice is best enjoyed in a healthy way, by EATING the fruit.  Thanks for choosing [facility], we consider it a privelige to serve you.

## 2021-05-22 NOTE — Assessment & Plan Note (Signed)
Take diclofenac once daily  as needed,  OTC tylenol PRN. AB Istudy in Dec Lab Results  Component Value Date   CREATININE 0.85 04/24/2021   BUN 15 04/24/2021   NA 141 04/24/2021   K 4.8 04/24/2021   CL 104 04/24/2021   CO2 25 04/24/2021

## 2021-05-24 ENCOUNTER — Telehealth: Payer: Self-pay | Admitting: Internal Medicine

## 2021-05-24 DIAGNOSIS — Z1211 Encounter for screening for malignant neoplasm of colon: Secondary | ICD-10-CM

## 2021-05-24 NOTE — Telephone Encounter (Signed)
Referral has been placed. 

## 2021-05-24 NOTE — Telephone Encounter (Signed)
Pt called in for ref. For colonoscopy w. DR. Gala Romney office

## 2021-06-05 ENCOUNTER — Ambulatory Visit: Payer: PPO | Admitting: Cardiovascular Disease

## 2021-06-05 ENCOUNTER — Encounter: Payer: Self-pay | Admitting: Cardiovascular Disease

## 2021-06-05 ENCOUNTER — Other Ambulatory Visit: Payer: Self-pay

## 2021-06-05 VITALS — BP 138/88 | HR 50 | Ht 63.0 in | Wt 180.2 lb

## 2021-06-05 DIAGNOSIS — I10 Essential (primary) hypertension: Secondary | ICD-10-CM | POA: Diagnosis not present

## 2021-06-05 DIAGNOSIS — I739 Peripheral vascular disease, unspecified: Secondary | ICD-10-CM

## 2021-06-05 DIAGNOSIS — I428 Other cardiomyopathies: Secondary | ICD-10-CM | POA: Diagnosis not present

## 2021-06-05 DIAGNOSIS — Z720 Tobacco use: Secondary | ICD-10-CM | POA: Diagnosis not present

## 2021-06-05 NOTE — Assessment & Plan Note (Signed)
Adrienne [name] was referred to me by Dr. Percival Spanish for evaluation of PAD.  Her pain sounds arthritic involving her knees ankles and heels.  She did have Doppler studies performed at [facility] 08/17/2020 revealing a right ABI 0.76 and left of 0.73.  Based on her lack of claudication symptoms.  I do not think further evaluation is necessary at this time.

## 2021-06-05 NOTE — Patient Instructions (Signed)

## 2021-06-05 NOTE — Progress Notes (Signed)
06/05/2021 Adrienne [name] [facility]   18-Aug-1945  [id number]  Primary Physician Lindell Spar, MD Primary Cardiologist: Lorretta Harp MD Adrienne [name], [place]  HPI:  Adrienne [name] is a 75 y.o. moderately overweight widowed African-American female mother of 52, grandmother of 7 grandchildren referred to me by Dr. Percival Spanish, her cardiologist, for evaluation of PAD.  She is retired from working at [employer] in the office.  She does have a history of treated hypertension and hyperlipidemia intolerant to statin therapy.  There is a "question of congestive heart failure in the past.  There is no family history for heart disease.  She is never had a heart attack or stroke.  She denies chest pain or shortness of breath.  She complains of discomfort in her knees, ankles and heels.  She really denies symptoms of claudication.  She had Doppler studies performed at [facility] 08/17/2020 revealing a right ABI of 0.76 and a left of 0.73.   Current Meds  Medication Sig   amLODipine (NORVASC) 10 MG tablet Take 1 tablet (10 mg total) by mouth daily. (Patient taking differently: Take 10 mg by mouth daily. EVERY OTHER DAY)   carvedilol (COREG) 3.125 MG tablet Take 1 tablet (3.125 mg total) by mouth 2 (two) times daily with a meal. (Patient taking differently: Take 3.125 mg by mouth 2 (two) times daily with a meal. ONCE EVERY OTHER DAY)   Cholecalciferol (VITAMIN D-3) 125 MCG (5000 UT) TABS Take 2 tablets by mouth daily.   Diclofenac-miSOPROStol 50-0.2 MG TBEC Take 1 tablet by mouth 2 (two) times daily between meals as needed.   losartan (COZAAR) 50 MG tablet Take 1 tablet (50 mg total) by mouth daily. (Patient taking differently: Take 50 mg by mouth daily. EVERY OTHER DAY)   NP THYROID 120 MG tablet Take 1 tablet (120 mg total) by mouth daily before breakfast. (Patient taking differently: Take 120 mg by mouth daily before breakfast. Half a tablet every day)     Allergies  Allergen  Reactions   Lisinopril Swelling    Angioedema Angioedema Angioedema   Atorvastatin Other (See Comments)    Muscle aches   Muscle aches  Muscle aches     Social History   Socioeconomic History   Marital status: Widowed    Spouse name: Not on file   Number of children: Not on file   Years of education: Not on file   Highest education level: Not on file  Occupational History   Occupation: retired  Tobacco Use   Smoking status: Former    Packs/day: 1.00    Years: 32.00    Pack years: 32.00    Types: E-cigarettes, Cigarettes    Quit date: 11/20/2016    Years since quitting: 4.5   Smokeless tobacco: Never   Tobacco comments:    Smoker since 1975 has quit and started back several times!!,  Vaping Use   Vaping Use: Former  Substance and Sexual Activity   Alcohol use: No   Drug use: No   Sexual activity: Not Currently    Birth control/protection: Surgical    Comment: Hysterectomy  Other Topics Concern   Not on file  Social History Narrative   Widow since 2006.Married previously for 25 years.Lives with grandaughter and 2 great grandchildren.Retired.Previously office work.   Social Determinants of Health   Financial Resource Strain: Not on file  Food Insecurity: Not on file  Transportation Needs: Not on file  Physical Activity: Not on  file  Stress: Not on file  Social Connections: Not on file  Intimate Partner Violence: Not on file     Review of Systems: General: negative for chills, fever, night sweats or weight changes.  Cardiovascular: negative for chest pain, dyspnea on exertion, edema, orthopnea, palpitations, paroxysmal nocturnal dyspnea or shortness of breath Dermatological: negative for rash Respiratory: negative for cough or wheezing Urologic: negative for hematuria Abdominal: negative for nausea, vomiting, diarrhea, bright red blood per rectum, melena, or hematemesis Neurologic: negative for visual changes, syncope, or dizziness All other systems  reviewed and are otherwise negative except as noted above.    Blood pressure (!) 190/94, pulse (!) 50, height 5\' 3"  (1.6 m), weight 180 lb 3.2 oz (81.7 kg), SpO2 97 %.  General appearance: alert and no distress Neck: no adenopathy, no carotid bruit, no JVD, supple, symmetrical, trachea midline, and thyroid not enlarged, symmetric, no tenderness/mass/nodules Lungs: clear to auscultation bilaterally Heart: regular rate and rhythm, S1, S2 normal, no murmur, click, rub or gallop Extremities: extremities normal, atraumatic, no cyanosis or edema Pulses: 2+ and symmetric Diminished pedal pulses Skin: Skin color, texture, turgor normal. No rashes or lesions Neurologic: Grossly normal  EKG sinus bradycardia 50 with left axis deviation.  Personally reviewed this EKG.  ASSESSMENT AND PLAN:   Peripheral arterial disease ([place]) Ms. Mogle was referred to me by Dr. Percival Spanish for evaluation of PAD.  Her pain sounds arthritic involving her knees ankles and heels.  She did have Doppler studies performed at [facility] 08/17/2020 revealing a right ABI 0.76 and left of 0.73.  Based on her lack of claudication symptoms.  I do not think further evaluation is necessary at this time.     Lorretta Harp MD FACP,FACC,FAHA, [facility] 06/05/2021 3:24 PM

## 2021-06-27 ENCOUNTER — Other Ambulatory Visit: Payer: Self-pay

## 2021-06-27 ENCOUNTER — Ambulatory Visit (INDEPENDENT_AMBULATORY_CARE_PROVIDER_SITE_OTHER): Payer: PPO

## 2021-06-27 DIAGNOSIS — Z0000 Encounter for general adult medical examination without abnormal findings: Secondary | ICD-10-CM

## 2021-06-27 NOTE — Progress Notes (Signed)
Subjective:   Adrienne [name] is a 76 y.o. female who presents for an Initial Medicare Annual Wellness Visit. I connected with  [name] on 06/27/21 by a audio enabled telemedicine application and verified that I am speaking with the correct person using two identifiers.  Patient Location: Home  Provider Location: Office/Clinic  I discussed the limitations of evaluation and management by telemedicine. The patient expressed understanding and agreed to proceed.  Review of Systems    Defer to  PCP Cardiac Risk Factors include: advanced age (>36men, >62 women);hypertension;smoking/ tobacco exposure     Objective:    Today's Vitals   06/27/21 1120  PainSc: 0-No pain   There is no height or weight on file to calculate BMI.  Advanced Directives 06/27/2021 07/26/2019 07/20/2019 04/08/2019 04/06/2019 03/15/2019 03/11/2018  Does Patient Have a Medical Advance Directive? No No No No No No No  Type of Advance Directive - - - - - - -  Does patient want to make changes to medical advance directive? - - - - - - -  Copy of [place] in Chart? - - - - - - -  Would patient like information on creating a medical advance directive? No - Patient declined No - Patient declined No - Patient declined No - Patient declined No - Patient declined Yes (MAU/Ambulatory/Procedural Areas - Information given) No - Patient declined    Current Medications (verified) Outpatient Encounter Medications as of 06/27/2021  Medication Sig   amLODipine (NORVASC) 10 MG tablet Take 1 tablet (10 mg total) by mouth daily. (Patient taking differently: Take 10 mg by mouth daily. EVERY OTHER DAY)   carvedilol (COREG) 3.125 MG tablet Take 1 tablet (3.125 mg total) by mouth 2 (two) times daily with a meal. (Patient taking differently: Take 3.125 mg by mouth 2 (two) times daily with a meal. ONCE EVERY OTHER DAY)   Cholecalciferol (VITAMIN D-3) 125 MCG (5000 UT) TABS Take 2 tablets by mouth daily.    Diclofenac-miSOPROStol 50-0.2 MG TBEC Take 1 tablet by mouth 2 (two) times daily between meals as needed.   losartan (COZAAR) 50 MG tablet Take 1 tablet (50 mg total) by mouth daily. (Patient taking differently: Take 50 mg by mouth daily. EVERY OTHER DAY)   NP THYROID 120 MG tablet Take 1 tablet (120 mg total) by mouth daily before breakfast. (Patient taking differently: Take 120 mg by mouth daily before breakfast. Half a tablet every day)   No facility-administered encounter medications on file as of 06/27/2021.    Allergies (verified) Lisinopril and Atorvastatin   History: Past Medical History:  Diagnosis Date   Ambulates with cane    straight cane   Arthritis    knee, back   CHF (congestive heart failure) ([place])    Ductal carcinoma in situ of breast 12/2018   R Breast-mastectomy only   Gout    History of blood transfusion 1986   w/ Hysterectomy surgery   Hyperlipidemia    diet controlled - no meds   Hypertension    Hypothyroidism    Pelvic kidney    lower right pelvic kidney    Prediabetes    diet controlled - no meds   SBO (small bowel obstruction) ([place]) 10/2016   surgery    Sleep apnea    Mild - no mask needed per sleep study   Smoker    quit smoking 2018   Past Surgical History:  Procedure Laterality Date   [place]  COMPLETE-precancerous   APPENDECTOMY     pt states it was removed when gallbladder was removed.   BACK SURGERY     lower back   BREAST LUMPECTOMY WITH RADIOACTIVE SEED LOCALIZATION Right 03/18/2019   Procedure: RIGHT BREAST LUMPECTOMY WITH RADIOACTIVE SEED LOCALIZATION;  Surgeon: Jovita Kussmaul, MD;  Location: [facility];  Service: General;  Laterality: Right;   CHOLECYSTECTOMY     COLONOSCOPY  02/15/2008   [name]    ECTOPIC PREGNANCY SURGERY     JOINT REPLACEMENT     Left hip total Dr. [name] 10-01-17   polypectomy-oropharynx     RE-EXCISION OF BREAST CANCER,SUPERIOR MARGINS Right 04/08/2019   Procedure: RE-EXCISION OF RIGHT  BREAST ANTERIOR MARGINS;  Surgeon: [name], MD;  Location: [facility];  Service: General;  Laterality: Right;   right knee meniscus     SBO  10/2016   small bowel obstruction   SIMPLE MASTECTOMY WITH AXILLARY SENTINEL NODE BIOPSY Right 07/26/2019   Procedure: RIGHT MASTECTOMY WITH SENTINEL NODE BIOPSY;  Surgeon: Jovita Kussmaul, MD;  Location: [facility];  Service: General;  Laterality: Right;   TOTAL HIP ARTHROPLASTY Left 10/01/2017   Procedure: LEFT  TOTAL HIP ARTHROPLASTY ANTERIOR APPROACH;  Surgeon: [name], MD;  Location: [facility];  Service: Orthopedics;  Laterality: Left;   TOTAL HIP ARTHROPLASTY Right 03/11/2018   Procedure: RIGHT TOTAL HIP ARTHROPLASTY ANTERIOR APPROACH;  Surgeon: [name], MD;  Location: [facility];  Service: Orthopedics;  Laterality: Right;  160min   UPPER GASTROINTESTINAL ENDOSCOPY     Family History  Problem Relation Age of Onset   Cancer Sister    Diabetes Brother    Diabetes Brother    Hypertension Other    Breast cancer Sister    Colon cancer Neg Hx    Colon polyps Neg Hx    Rectal cancer Neg Hx    Stomach cancer Neg Hx    Social History   Socioeconomic History   Marital status: Widowed    Spouse name: Not on file   Number of children: 3   Years of education: 8   Highest education level: Some college, no degree  Occupational History   Occupation: retired  Tobacco Use   Smoking status: Former    Packs/day: 1.00    Years: 32.00    Pack years: 32.00    Types: E-cigarettes, Cigarettes    Quit date: 11/20/2016    Years since quitting: 4.6   Smokeless tobacco: Never   Tobacco comments:    Smoker since 1975 has quit and started back several times!!,  Vaping Use   Vaping Use: Former  Substance and Sexual Activity   Alcohol use: No   Drug use: No   Sexual activity: Not Currently    Birth control/protection: Surgical    Comment: Hysterectomy  Other Topics Concern   Not on file  Social History Narrative   Widow since  2006.Married previously for 25 years.Lives with grandson and 2 great grandchildren.Retired.Previously office work.   Social Determinants of Health   Financial Resource Strain: Low Risk    Difficulty of Paying Living Expenses: Not hard at all  Food Insecurity: No Food Insecurity   Worried About Charity fundraiser in the Last Year: Never true   [place] in the Last Year: Never true  Transportation Needs: No Transportation Needs   Lack of Transportation (Medical): No   Lack of Transportation (Non-Medical): No  Physical Activity: Inactive   Days of Exercise  per Week: 0 days   Minutes of Exercise per Session: 0 min  Stress: No Stress Concern Present   Feeling of Stress : Not at all  Social Connections: Moderately Integrated   Frequency of Communication with Friends and Family: More than three times a week   Frequency of Social Gatherings with Friends and Family: Once a week   Attends Religious Services: More than 4 times per year   Active Member of [employer] or Organizations: Yes   Attends Archivist Meetings: More than 4 times per year   Marital Status: Widowed    Tobacco Counseling Counseling given: Not Answered Tobacco comments: Smoker since 1975 has quit and started back several times!!,   Clinical Intake:     Pain : No/denies pain Pain Score: 0-No pain     Nutritional Risks: None Diabetes: No  How often do you need to have someone help you when you read instructions, pamphlets, or other written materials from your doctor or pharmacy?: 1 - Never What is the last grade level you completed in school?: 12  Diabetic?no  Interpreter Needed?: No  Information entered by :: [place] of Daily Living In your present state of health, do you have any difficulty performing the following activities: 06/27/2021 04/24/2021  Hearing? N N  Vision? N N  Difficulty concentrating or making decisions? N N  Walking or climbing stairs? N Y  Dressing or bathing?  - N  Doing errands, shopping? N N  Preparing Food and eating ? N -  Using the Toilet? N -  In the past six months, have you accidently leaked urine? N -  Do you have problems with loss of bowel control? N -  Managing your Medications? N -  Managing your Finances? N -  Housekeeping or managing your Housekeeping? N -  Some recent data might be hidden    Patient Care Team: Lindell Spar, MD as PCP - General (Internal Medicine) Minus Breeding, MD as PCP - Cardiology (Cardiology) Rockwell Germany, RN as Oncology Nurse Navigator Mauro Kaufmann, RN as Oncology Nurse Navigator Jovita Kussmaul, MD as Consulting Physician (General Surgery) Truitt Merle, MD as Consulting Physician (Hematology) Eppie Gibson, MD as Attending Physician (Radiation Oncology) Edythe Clarity, [facility] as Pharmacist (Pharmacist)  Indicate any recent Medical Services you may have received from other than [facility] providers in the past year (date may be approximate).     Assessment:   This is a routine wellness examination for [place].  Hearing/Vision screen No results found.  Dietary issues and exercise activities discussed:     Goals Addressed   None   Depression Screen PHQ 2/9 Scores 06/27/2021 05/22/2021 04/24/2021 12/05/2020 08/29/2020 04/03/2020 08/05/2017  PHQ - 2 Score 0 0 0 0 0 0 0  PHQ- 9 Score - - - 0 0 - -    Fall Risk Fall Risk  06/27/2021 05/22/2021 04/24/2021 12/05/2020 08/29/2020  Falls in the past year? 0 0 0 0 0  Number falls in past yr: 0 0 0 - -  Comment - - - - -  Injury with Fall? 0 0 0 - -  Risk for fall due to : No Fall Risks No Fall Risks No Fall Risks - -  Follow up Falls evaluation completed Falls evaluation completed Falls evaluation completed - -    FALL RISK PREVENTION PERTAINING TO THE HOME:  Any stairs in or around the home? Yes  If so, are there any without handrails? Yes  Home free of loose throw rugs in walkways, pet beds, electrical cords, etc? Yes  Adequate lighting in your home  to reduce risk of falls? Yes   ASSISTIVE DEVICES UTILIZED TO PREVENT FALLS:  Life alert? No  Use of a cane, walker or w/c? No  Grab bars in the bathroom? No  Shower chair or bench in shower? No  Elevated toilet seat or a handicapped toilet? No    Cognitive Function:     6CIT Screen 06/27/2021  What Year? 0 points  What month? 0 points  What time? 0 points  Count back from 20 0 points  Months in reverse 0 points  Repeat phrase 0 points  Total Score 0    Immunizations Immunization History  Administered Date(s) Administered   Fluad Quad(high Dose 65+) 04/24/2021   Moderna Sars-Covid-2 Vaccination 09/08/2019, 10/14/2019, 05/11/2020   Pneumococcal Conjugate-13 05/27/2013   Pneumococcal Polysaccharide-23 06/02/2014   Pneumococcal-Unspecified 05/27/2013   Tdap 05/27/2013    TDAP status: Up to date  Flu Vaccine status: Up to date  Pneumococcal vaccine status: Up to date  Covid-19 vaccine status: Information provided on how to obtain vaccines.   Qualifies for Shingles Vaccine? Yes   Zostavax completed No   Shingrix Completed?: No.    Education has been provided regarding the importance of this vaccine. Patient has been advised to call insurance company to determine out of pocket expense if they have not yet received this vaccine. Advised may also receive vaccine at local pharmacy or Health Dept. Verbalized acceptance and understanding.  Screening Tests Health Maintenance  Topic Date Due   Zoster Vaccines- Shingrix (1 of 2) Never done   COVID-19 Vaccine (4 - Booster for Moderna series) 07/06/2020   COLONOSCOPY (Pts 45-78yrs Insurance coverage will need to be confirmed)  03/10/2021   MAMMOGRAM  02/21/2023   TETANUS/TDAP  05/28/2023   Pneumonia Vaccine 95+ Years old  Completed   INFLUENZA VACCINE  Completed   DEXA SCAN  Completed   HPV VACCINES  Aged Out   Hepatitis C Screening  Discontinued    Health Maintenance  Health Maintenance Due  Topic Date Due   Zoster  Vaccines- Shingrix (1 of 2) Never done   COVID-19 Vaccine (4 - Booster for Moderna series) 07/06/2020   COLONOSCOPY (Pts 45-40yrs Insurance coverage will need to be confirmed)  03/10/2021    Colorectal cancer screening: Type of screening: Colonoscopy. Completed 03/10/2018. Repeat every 3 years  Mammogram status: Completed 02/20/2021. Repeat every year  Bone Density status: Completed 01/04/2014. Results reflect: Bone density results: NORMAL. Repeat every 2 years.  Lung Cancer Screening: (Low Dose CT Chest recommended if Age 13-80 years, 30 pack-year currently smoking OR have quit w/in 15years.) does not qualify.   Lung Cancer Screening Referral: n/a  Additional Screening:  Hepatitis C Screening: does qualify; Completed not high risk  Vision Screening: Recommended annual ophthalmology exams for early detection of glaucoma and other disorders of the eye. Is the patient up to date with their annual eye exam?  Yes  Who is the provider or what is the name of the office in which the patient attends annual eye exams? Dr Graciella Freer [place] If pt is not established with a provider, would they like to be referred to a provider to establish care? No .   Dental Screening: Recommended annual dental exams for proper oral hygiene  Community Resource Referral / Chronic Care Management: CRR required this visit?  No   CCM required this visit?  No  Plan:     I have personally reviewed and noted the following in the patients chart:   Medical and social history Use of alcohol, tobacco or illicit drugs  Current medications and supplements including opioid prescriptions. Patient is not currently taking opioid prescriptions. Functional ability and status Nutritional status Physical activity Advanced directives List of other physicians Hospitalizations, surgeries, and ER visits in previous 12 months Vitals Screenings to include cognitive, depression, and falls Referrals and appointments  In  addition, I have reviewed and discussed with patient certain preventive protocols, quality metrics, and best practice recommendations. A written personalized care plan for preventive services as well as general preventive health recommendations were provided to patient.     Earline Mayotte, [place]   06/27/2021   Nurse Notes:  Ms. Deakins , Thank you for taking time to come for your Medicare Wellness Visit. I appreciate your ongoing commitment to your health goals. Please review the following plan we discussed and let me know if I can assist you in the future.   These are the goals we discussed:  Goals   None     This is a list of the screening recommended for you and due dates:  Health Maintenance  Topic Date Due   Zoster (Shingles) Vaccine (1 of 2) Never done   COVID-19 Vaccine (4 - Booster for Moderna series) 07/06/2020   Colon Cancer Screening  03/10/2021   Mammogram  02/21/2023   Tetanus Vaccine  05/28/2023   Pneumonia Vaccine  Completed   Flu Shot  Completed   DEXA scan (bone density measurement)  Completed   HPV Vaccine  Aged Out   Hepatitis C Screening: USPSTF Recommendation to screen - Ages 18-79 yo.  Discontinued

## 2021-06-27 NOTE — Patient Instructions (Addendum)
Adrienne [name] , Thank you for taking time to come for your Medicare Wellness Visit. I appreciate your ongoing commitment to your health goals. Please review the following plan we discussed and let me know if I can assist you in the future.   These are the goals we discussed:  Goals   None     This is a list of the screening recommended for you and due dates:  Health Maintenance  Topic Date Due   Zoster (Shingles) Vaccine (1 of 2) Never done   COVID-19 Vaccine (4 - Booster for Moderna series) 07/06/2020   Colon Cancer Screening  03/10/2021   Mammogram  02/21/2023   Tetanus Vaccine  05/28/2023   Pneumonia Vaccine  Completed   Flu Shot  Completed   DEXA scan (bone density measurement)  Completed   HPV Vaccine  Aged Out   Hepatitis C Screening: USPSTF Recommendation to screen - Ages 18-79 yo.  Discontinued    Health Maintenance, Female Adopting a healthy lifestyle and getting preventive care are important in promoting health and wellness. Ask your health care provider about: The right schedule for you to have regular tests and exams. Things you can do on your own to prevent diseases and keep yourself healthy. What should I know about diet, weight, and exercise? Eat a healthy diet  Eat a diet that includes plenty of vegetables, fruits, low-fat dairy products, and lean protein. Do not eat a lot of foods that are high in solid fats, added sugars, or sodium. Maintain a healthy weight Body mass index (BMI) is used to identify weight problems. It estimates body fat based on height and weight. Your health care provider can help determine your BMI and help you achieve or maintain a healthy weight. Get regular exercise Get regular exercise. This is one of the most important things you can do for your health. Most adults should: Exercise for at least 150 minutes each week. The exercise should increase your heart rate and make you sweat (moderate-intensity exercise). Do strengthening exercises  at least twice a week. This is in addition to the moderate-intensity exercise. Spend less time sitting. Even light physical activity can be beneficial. Watch cholesterol and blood lipids Have your blood tested for lipids and cholesterol at 74 years of age, then have this test every 5 years. Have your cholesterol levels checked more often if: Your lipid or cholesterol levels are high. You are older than 73 years of age. You are at high risk for heart disease. What should I know about cancer screening? Depending on your health history and family history, you may need to have cancer screening at various ages. This may include screening for: Breast cancer. Cervical cancer. Colorectal cancer. Skin cancer. Lung cancer. What should I know about heart disease, diabetes, and high blood pressure? Blood pressure and heart disease High blood pressure causes heart disease and increases the risk of stroke. This is more likely to develop in people who have high blood pressure readings or are overweight. Have your blood pressure checked: Every 3-5 years if you are 72-49 years of age. Every year if you are 63 years old or older. Diabetes Have regular diabetes screenings. This checks your fasting blood sugar level. Have the screening done: Once every three years after age 30 if you are at a normal weight and have a low risk for diabetes. More often and at a younger age if you are overweight or have a high risk for diabetes. What should I know about  preventing infection? Hepatitis B If you have a higher risk for hepatitis B, you should be screened for this virus. Talk with your health care provider to find out if you are at risk for hepatitis B infection. Hepatitis C Testing is recommended for: Everyone born from [birth year] through 1963. Anyone with known risk factors for hepatitis C. Sexually transmitted infections (STIs) Get screened for STIs, including gonorrhea and chlamydia, if: You are sexually active  and are younger than 74 years of age. You are older than 74 years of age and your health care provider tells you that you are at risk for this type of infection. Your sexual activity has changed since you were last screened, and you are at increased risk for chlamydia or gonorrhea. Ask your health care provider if you are at risk. Ask your health care provider about whether you are at high risk for HIV. Your health care provider may recommend a prescription medicine to help prevent HIV infection. If you choose to take medicine to prevent HIV, you should first get tested for HIV. You should then be tested every 3 months for as long as you are taking the medicine. Pregnancy If you are about to stop having your period (premenopausal) and you may become pregnant, seek counseling before you get pregnant. Take 400 to 800 micrograms (mcg) of folic acid every day if you become pregnant. Ask for birth control (contraception) if you want to prevent pregnancy. Osteoporosis and menopause Osteoporosis is a disease in which the bones lose minerals and strength with aging. This can result in bone fractures. If you are 31 years old or older, or if you are at risk for osteoporosis and fractures, ask your health care provider if you should: Be screened for bone loss. Take a calcium or vitamin D supplement to lower your risk of fractures. Be given hormone replacement therapy (HRT) to treat symptoms of menopause. Follow these instructions at home: Alcohol use Do not drink alcohol if: Your health care provider tells you not to drink. You are pregnant, may be pregnant, or are planning to become pregnant. If you drink alcohol: Limit how much you have to: 0-1 drink a day. Know how much alcohol is in your drink. In the [place] one drink equals one 12 oz bottle of beer (355 mL), one 5 oz glass of wine (148 mL), or one 1 oz glass of hard liquor (44 mL). Lifestyle Do not use any products that contain nicotine or tobacco.  These products include cigarettes, chewing tobacco, and vaping devices, such as e-cigarettes. If you need help quitting, ask your health care provider. Do not use street drugs. Do not share needles. Ask your health care provider for help if you need support or information about quitting drugs. General instructions Schedule regular health, dental, and eye exams. Stay current with your vaccines. Tell your health care provider if: You often feel depressed. You have ever been abused or do not feel safe at home. Summary Adopting a healthy lifestyle and getting preventive care are important in promoting health and wellness. Follow your health care provider's instructions about healthy diet, exercising, and getting tested or screened for diseases. Follow your health care provider's instructions on monitoring your cholesterol and blood pressure. This information is not intended to replace advice given to you by your health care provider. Make sure you discuss any questions you have with your health care provider. Document Revised: 10/30/2020 Document Reviewed: 10/30/2020 Elsevier Patient Education  [employer].

## 2021-06-28 DIAGNOSIS — D0511 Intraductal carcinoma in situ of right breast: Secondary | ICD-10-CM | POA: Diagnosis not present

## 2021-07-23 ENCOUNTER — Other Ambulatory Visit: Payer: Self-pay | Admitting: Internal Medicine

## 2021-07-23 DIAGNOSIS — M1712 Unilateral primary osteoarthritis, left knee: Secondary | ICD-10-CM

## 2021-07-27 ENCOUNTER — Telehealth: Payer: Self-pay

## 2021-07-27 NOTE — Telephone Encounter (Signed)
Received a call from Rx advance about patient medicine Diclofenac 50 mg-0.2 mg  No longer on the patient list will need a new prior authorization, can call [phone] to initiate it over the phone or they will be faxing a copy over to our office.

## 2021-08-01 ENCOUNTER — Telehealth: Payer: Self-pay | Admitting: Internal Medicine

## 2021-08-01 DIAGNOSIS — M79604 Pain in right leg: Secondary | ICD-10-CM | POA: Insufficient documentation

## 2021-08-01 DIAGNOSIS — M79605 Pain in left leg: Secondary | ICD-10-CM | POA: Insufficient documentation

## 2021-08-01 NOTE — Progress Notes (Signed)
Cardiology Office Note   Date:  08/02/2021   ID:  Adrienne [name] 06/22/1946, MRN [id number]  PCP:  Lindell Spar, MD  Cardiologist:   Minus Breeding, MD   Chief Complaint  Patient presents with   Cardiomyopathy      History of Present Illness:  Adrienne [name] is a 76 y.o. female who presents for follow up of CHF.  She had an EF of 44% in 2010.   EF was 55% in 2015.  Since I last saw her she has done well.  She did have some leg pain and I sent her and she was found to have ABIs of 0.76 on the right 10.73 on the left.  She did see Dr. Gwenlyn Found who recommended no further cardiovascular testing but just continued risk management.  Since she was last seen she has had no new cardiovascular complaints.  She says she is very busy around her house.  It does not sound like she has a usual exercise regimen but she denies any other cardiovascular symptoms. The patient denies any new symptoms such as chest discomfort, neck or arm discomfort. There has been no new shortness of breath, PND or orthopnea. There have been no reported palpitations, presyncope or syncope.    Past Medical History:  Diagnosis Date   Ambulates with cane    straight cane   Arthritis    knee, back   CHF (congestive heart failure) ([place])    Ductal carcinoma in situ of breast 12/2018   R Breast-mastectomy only   Gout    History of blood transfusion 1986   w/ Hysterectomy surgery   Hyperlipidemia    diet controlled - no meds   Hypertension    Hypothyroidism    Pelvic kidney    lower right pelvic kidney    Prediabetes    diet controlled - no meds   SBO (small bowel obstruction) ([place]) 10/2016   surgery    Sleep apnea    Mild - no mask needed per sleep study   Smoker    quit smoking 2018    Past Surgical History:  Procedure Laterality Date   [place]     pt states it was removed when gallbladder was removed.   BACK SURGERY      lower back   BREAST LUMPECTOMY WITH RADIOACTIVE SEED LOCALIZATION Right 03/18/2019   Procedure: RIGHT BREAST LUMPECTOMY WITH RADIOACTIVE SEED LOCALIZATION;  Surgeon: Jovita Kussmaul, MD;  Location: [facility];  Service: General;  Laterality: Right;   CHOLECYSTECTOMY     COLONOSCOPY  02/15/2008   [name]    ECTOPIC PREGNANCY SURGERY     JOINT REPLACEMENT     Left hip total Dr. [name] 10-01-17   polypectomy-oropharynx     RE-EXCISION OF BREAST CANCER,SUPERIOR MARGINS Right 04/08/2019   Procedure: RE-EXCISION OF RIGHT BREAST ANTERIOR MARGINS;  Surgeon: [name], MD;  Location[facility];  Service: General;  Laterality: Right;   right knee meniscus     SBO  10/2016   small bowel obstruction   SIMPLE MASTECTOMY WITH AXILLARY SENTINEL NODE BIOPSY Right 07/26/2019   Procedure: RIGHT MASTECTOMY WITH SENTINEL NODE BIOPSY;  Surgeon: Jovita Kussmaul, MD;  Location: [facility];  Service: General;  Laterality: Right;   TOTAL HIP ARTHROPLASTY Left 10/01/2017   Procedure: LEFT  TOTAL HIP ARTHROPLASTY ANTERIOR APPROACH;  Surgeon: [name], MD;  [facility];  Service: Orthopedics;  Laterality: Left;   TOTAL HIP ARTHROPLASTY Right 03/11/2018   Procedure: RIGHT TOTAL HIP ARTHROPLASTY ANTERIOR APPROACH;  Surgeon: [name], MD;  [facility];  Service: Orthopedics;  Laterality: Right;  110min   UPPER GASTROINTESTINAL ENDOSCOPY       Current Outpatient Medications  Medication Sig Dispense Refill   amLODipine (NORVASC) 10 MG tablet Take 1 tablet (10 mg total) by mouth daily. (Patient taking differently: Take 10 mg by mouth daily. EVERY OTHER DAY) 90 tablet 1   carvedilol (COREG) 3.125 MG tablet Take 1 tablet (3.125 mg total) by mouth 2 (two) times daily with a meal. (Patient taking differently: Take 3.125 mg by mouth 2 (two) times daily with a meal. ONCE EVERY OTHER DAY) 180 tablet 1   Cholecalciferol (VITAMIN D-3) 125 MCG (5000 UT) TABS Take 2 tablets by mouth daily.      Diclofenac-miSOPROStol 50-0.2 MG TBEC TAKE 1 TABLET BY MOUTH TWICE DAILY BETWEEN  MEALS  AS  NEEDED 60 tablet 0   losartan (COZAAR) 50 MG tablet Take 1 tablet (50 mg total) by mouth daily. (Patient taking differently: Take 50 mg by mouth daily. EVERY OTHER DAY) 90 tablet 1   NP THYROID 120 MG tablet Take 1 tablet (120 mg total) by mouth daily before breakfast. (Patient taking differently: Take 120 mg by mouth daily before breakfast. Half a tablet every day) 30 tablet 2   No current facility-administered medications for this visit.    Allergies:   Lisinopril and Atorvastatin    ROS:  Please see the history of present illness.   Otherwise, review of systems are positive for none.   All other systems are reviewed and negative.    PHYSICAL EXAM: VS:  BP (!) 142/78    Pulse 60    Ht 5\' 3"  (1.6 m)    Wt 177 lb 3.2 oz (80.4 kg)    SpO2 98%    BMI 31.39 kg/m  , BMI Body mass index is 31.39 kg/m.  GENERAL:  Well appearing NECK:  No jugular venous distention, waveform within normal limits, carotid upstroke brisk and symmetric, no bruits, no thyromegaly LUNGS:  Clear to auscultation bilaterally CHEST:  Unremarkable HEART:  PMI not displaced or sustained,S1 and S2 within normal limits, no S3, no S4, no clicks, no rubs, no murmurs ABD:  Flat, positive bowel sounds normal in frequency in pitch, no bruits, no rebound, no guarding, no midline pulsatile mass, no hepatomegaly, no splenomegaly EXT:  2 plus pulses throughout, no edema, no cyanosis no clubbing   EKG:  EKG is not ordered today. The ekg ordered 06/06/2021 demonstrates sinus bradycardia, rate 50, axis within normal limits, intervals within normal limits, no acute ST-T wave changes.   Recent Labs: 04/24/2021: ALT 45; BUN 15; Creatinine, Ser 0.85; Hemoglobin 13.1; Platelets 283; Potassium 4.8; Sodium 141; TSH <0.005    Lipid Panel    Component Value Date/Time   CHOL 218 (H) 12/05/2020 1027   TRIG 141 12/05/2020 1027   HDL 45 (L)  12/05/2020 1027   CHOLHDL 4.8 12/05/2020 1027   VLDL 20 04/08/2016 0933   LDLCALC 146 (H) 12/05/2020 1027      Wt Readings from Last 3 Encounters:  08/02/21 177 lb 3.2 oz (80.4 kg)  06/05/21 180 lb 3.2 oz (81.7 kg)  05/22/21 177 lb (80.3 kg)      Other studies Reviewed: Additional studies/ records that were reviewed today include: Labs. Review of the above records demonstrates:  Please see elsewhere in the note.  ASSESSMENT AND PLAN:  CARDIOMYOPATHY: There is no indication that her ejection fraction might be lower clinically.  No further imaging is indicated.  HTN: Her blood pressure is at target at home in the 130s over 70s.  No change in therapy.   DYSLIPIDEMIA:    She think she is overmedicated.  She has not wanted other medications for her lipids.  They were slightly better with an LDL of 146 versus 182 previously.   No change in therapy.   Current medicines are reviewed at length with the patient today.  The patient does not have concerns regarding medicines.  The following changes have been made:  None  Labs/ tests ordered today include:  None    No orders of the defined types were placed in this encounter.    Disposition:   FU with me in 12 months at her request   Signed, Minus Breeding, MD  08/02/2021 4:27 PM    [place]

## 2021-08-01 NOTE — Telephone Encounter (Signed)
Pt called in regard to colonoscopy referral    Pt states that hasn't heard from [facility] office  Wants to get referral to office in [place]

## 2021-08-02 ENCOUNTER — Encounter: Payer: Self-pay | Admitting: Cardiology

## 2021-08-02 ENCOUNTER — Ambulatory Visit: Payer: PPO | Admitting: Cardiology

## 2021-08-02 ENCOUNTER — Other Ambulatory Visit: Payer: Self-pay

## 2021-08-02 VITALS — BP 142/78 | HR 60 | Ht 63.0 in | Wt 177.2 lb

## 2021-08-02 DIAGNOSIS — M79605 Pain in left leg: Secondary | ICD-10-CM | POA: Diagnosis not present

## 2021-08-02 DIAGNOSIS — M79604 Pain in right leg: Secondary | ICD-10-CM | POA: Diagnosis not present

## 2021-08-02 DIAGNOSIS — I428 Other cardiomyopathies: Secondary | ICD-10-CM | POA: Diagnosis not present

## 2021-08-02 DIAGNOSIS — E785 Hyperlipidemia, unspecified: Secondary | ICD-10-CM | POA: Diagnosis not present

## 2021-08-02 DIAGNOSIS — I10 Essential (primary) hypertension: Secondary | ICD-10-CM

## 2021-08-02 NOTE — Patient Instructions (Signed)
  Follow-Up: At [facility], you and your health needs are our priority.  As part of our continuing mission to provide you with exceptional heart care, we have created designated Provider Care Teams.  These Care Teams include your primary Cardiologist (physician) and Advanced Practice Providers (APPs -  Physician Assistants and Nurse Practitioners) who all work together to provide you with the care you need, when you need it.  We recommend signing up for the patient portal called "MyChart".  Sign up information is provided on this After Visit Summary.  MyChart is used to connect with patients for Virtual Visits (Telemedicine).  Patients are able to view lab/test results, encounter notes, upcoming appointments, etc.  Non-urgent messages can be sent to your provider as well.   To learn more about what you can do with MyChart, go to [email or web address].    Your next appointment:   12 month(s)  The format for your next appointment:   In Person  Provider:   James Hochrein, MD   

## 2021-08-07 ENCOUNTER — Telehealth: Payer: Self-pay

## 2021-08-07 NOTE — Telephone Encounter (Signed)
Patient called for Follow up on Referral to Gastro:  Sent to [facility] 05/24/21 - I called [facility] office today. Talked with Bianka at [facility] and she will call Darlina Sicilian today.  They are behind calling patients due to short staff and schedules being very far out.  I called patient to let her know to expect a call.

## 2021-08-30 ENCOUNTER — Encounter: Payer: Self-pay | Admitting: Internal Medicine

## 2021-08-30 ENCOUNTER — Ambulatory Visit (INDEPENDENT_AMBULATORY_CARE_PROVIDER_SITE_OTHER): Payer: PPO | Admitting: Internal Medicine

## 2021-08-30 ENCOUNTER — Other Ambulatory Visit: Payer: Self-pay

## 2021-08-30 VITALS — BP 128/82 | HR 43 | Resp 18 | Ht 63.0 in | Wt 177.4 lb

## 2021-08-30 DIAGNOSIS — Z9114 Patient's other noncompliance with medication regimen: Secondary | ICD-10-CM | POA: Diagnosis not present

## 2021-08-30 DIAGNOSIS — R7303 Prediabetes: Secondary | ICD-10-CM

## 2021-08-30 DIAGNOSIS — I428 Other cardiomyopathies: Secondary | ICD-10-CM | POA: Diagnosis not present

## 2021-08-30 DIAGNOSIS — Z1211 Encounter for screening for malignant neoplasm of colon: Secondary | ICD-10-CM | POA: Diagnosis not present

## 2021-08-30 DIAGNOSIS — Z0001 Encounter for general adult medical examination with abnormal findings: Secondary | ICD-10-CM | POA: Diagnosis not present

## 2021-08-30 DIAGNOSIS — E039 Hypothyroidism, unspecified: Secondary | ICD-10-CM

## 2021-08-30 DIAGNOSIS — I10 Essential (primary) hypertension: Secondary | ICD-10-CM | POA: Diagnosis not present

## 2021-08-30 NOTE — Assessment & Plan Note (Addendum)
Lab Results  ?Component Value Date  ? TSH <0.005 (L) 04/24/2021  ? ?On NP thyroid 120 mg - takes 1 tablet and 1/2 tablet on alternate days ?Discussed about switching to Levothyroxine, Adrienne [name] would prefer to stay on NP thyroid for now ?Has oversupplemented thyroid, had advised her to take 1/2 tablet QD, emphasized for compliance ?Check TSH and free T4 ?

## 2021-08-30 NOTE — Patient Instructions (Signed)
Please stop taking Amlodipine. Please start taking Losartan and Coreg as prescribed. ? ?Please continue to follow low salt diet and ambulate as tolerated. ?

## 2021-08-31 LAB — CMP14+EGFR
ALT: 23 IU/L (ref 0–32)
AST: 29 IU/L (ref 0–40)
Albumin/Globulin Ratio: 1.2 (ref 1.2–2.2)
Albumin: 4.1 g/dL (ref 3.7–4.7)
Alkaline Phosphatase: 108 IU/L (ref 44–121)
BUN/Creatinine Ratio: 16 (ref 12–28)
BUN: 14 mg/dL (ref 8–27)
Bilirubin Total: 0.4 mg/dL (ref 0.0–1.2)
CO2: 25 mmol/L (ref 20–29)
Calcium: 9.7 mg/dL (ref 8.7–10.3)
Chloride: 103 mmol/L (ref 96–106)
Creatinine, Ser: 0.86 mg/dL (ref 0.57–1.00)
Globulin, Total: 3.3 g/dL (ref 1.5–4.5)
Glucose: 95 mg/dL (ref 70–99)
Potassium: 4.3 mmol/L (ref 3.5–5.2)
Sodium: 142 mmol/L (ref 134–144)
Total Protein: 7.4 g/dL (ref 6.0–8.5)
eGFR: 71 mL/min/{1.73_m2} (ref >59)

## 2021-08-31 LAB — HEMOGLOBIN A1C
Est. average glucose Bld gHb Est-mCnc: 128 mg/dL
Hgb A1c MFr Bld: 6.1 % — ABNORMAL HIGH (ref 4.8–5.6)

## 2021-08-31 LAB — TSH+FREE T4
Free T4: 1 ng/dL (ref 0.82–1.77)
TSH: 0.109 u[IU]/mL — ABNORMAL LOW (ref 0.450–4.500)

## 2021-08-31 NOTE — Assessment & Plan Note (Signed)

## 2021-08-31 NOTE — Progress Notes (Signed)
Established Patient Office Visit  Subjective:  Patient ID: Adrienne [name], female    DOB: [birth year]  Age: 76 y.o. MRN: [id number]  CC:  Chief Complaint  Patient presents with   Annual Exam    Annual exam pt needs colonoscopy    HPI Adrienne [name] is a 76 y.o. female with past medical history of HTN, nonishcemic CM, hypothyroidism, OA of hip and knee, HLD, breast ca. s/p right mastectomy who presents for annual physical.  HTN: Her BP is well controlled today, but she has been taking her amlodipine, losartan and Coreg on every other day instead of daily.  Her BP was elevated during recent cardiology visit, which is likely due to known compliance to her antihypertensive regimen.  She currently denies any headache, dizziness, chest pain or dyspnea.  She also has been taking NP thyroid erratically instead of half tablet daily as she was advised in the last visit.  She denies any recent change in weight or appetite.    Past Medical History:  Diagnosis Date   Ambulates with cane    straight cane   Arthritis    knee, back   CHF (congestive heart failure) ([place])    Ductal carcinoma in situ of breast 12/2018   R Breast-mastectomy only   Gout    History of blood transfusion 1986   w/ Hysterectomy surgery   Hyperlipidemia    diet controlled - no meds   Hypertension    Hypothyroidism    Pelvic kidney    lower right pelvic kidney    Prediabetes    diet controlled - no meds   SBO (small bowel obstruction) ([place]) 10/2016   surgery    Sleep apnea    Mild - no mask needed per sleep study   Smoker    quit smoking 2018    Past Surgical History:  Procedure Laterality Date   [place]     pt states it was removed when gallbladder was removed.   BACK SURGERY     lower back   BREAST LUMPECTOMY WITH RADIOACTIVE SEED LOCALIZATION Right 03/18/2019   Procedure: RIGHT BREAST LUMPECTOMY WITH RADIOACTIVE SEED  LOCALIZATION;  Surgeon: Jovita Kussmaul, MD;  Location: [facility];  Service: General;  Laterality: Right;   CHOLECYSTECTOMY     COLONOSCOPY  02/15/2008   [name]    ECTOPIC PREGNANCY SURGERY     JOINT REPLACEMENT     Left hip total Dr. [name] 10-01-17   polypectomy-oropharynx     RE-EXCISION OF BREAST CANCER,SUPERIOR MARGINS Right 04/08/2019   Procedure: RE-EXCISION OF RIGHT BREAST ANTERIOR MARGINS;  Surgeon: [name], MD;  [facility];  Service: General;  Laterality: Right;   right knee meniscus     SBO  10/2016   small bowel obstruction   SIMPLE MASTECTOMY WITH AXILLARY SENTINEL NODE BIOPSY Right 07/26/2019   Procedure: RIGHT MASTECTOMY WITH SENTINEL NODE BIOPSY;  Surgeon: Jovita Kussmaul, MD;  Location: [facility];  Service: General;  Laterality: Right;   TOTAL HIP ARTHROPLASTY Left 10/01/2017   Procedure: LEFT  TOTAL HIP ARTHROPLASTY ANTERIOR APPROACH;  Surgeon: [name], MD;  [facility];  Service: Orthopedics;  Laterality: Left;   TOTAL HIP ARTHROPLASTY Right 03/11/2018   Procedure: RIGHT TOTAL HIP ARTHROPLASTY ANTERIOR APPROACH;  Surgeon: [name], MD;  [facility];  Service: Orthopedics;  Laterality: Right;  137mn   UPPER GASTROINTESTINAL ENDOSCOPY  Family History  Problem Relation Age of Onset   Cancer Sister    Diabetes Brother    Diabetes Brother    Hypertension Other    Breast cancer Sister    Colon cancer Neg Hx    Colon polyps Neg Hx    Rectal cancer Neg Hx    Stomach cancer Neg Hx     Social History   Socioeconomic History   Marital status: Widowed    Spouse name: Not on file   Number of children: 3   Years of education: 73   Highest education level: Some college, no degree  Occupational History   Occupation: retired  Tobacco Use   Smoking status: Former    Packs/day: 1.00    Years: 32.00    Pack years: 32.00    Types: E-cigarettes, Cigarettes    Quit date: 11/20/2016    Years since quitting: 4.7   Smokeless  tobacco: Never   Tobacco comments:    Smoker since 1975 has quit and started back several times!!,  Vaping Use   Vaping Use: Former  Substance and Sexual Activity   Alcohol use: No   Drug use: No   Sexual activity: Not Currently    Birth control/protection: Surgical    Comment: Hysterectomy  Other Topics Concern   Not on file  Social History Narrative   Widow since 2006.Married previously for 25 years.Lives with grandson and 2 great grandchildren.Retired.Previously office work.   Social Determinants of Health   Financial Resource Strain: Low Risk    Difficulty of Paying Living Expenses: Not hard at all  Food Insecurity: No Food Insecurity   Worried About Charity fundraiser in the Last Year: Never true   [place] in the Last Year: Never true  Transportation Needs: No Transportation Needs   Lack of Transportation (Medical): No   Lack of Transportation (Non-Medical): No  Physical Activity: Inactive   Days of Exercise per Week: 0 days   Minutes of Exercise per Session: 0 min  Stress: No Stress Concern Present   Feeling of Stress : Not at all  Social Connections: Moderately Integrated   Frequency of Communication with Friends and Family: More than three times a week   Frequency of Social Gatherings with Friends and Family: Once a week   Attends Religious Services: More than 4 times per year   Active Member of [employer] or Organizations: Yes   Attends Archivist Meetings: More than 4 times per year   Marital Status: Widowed  Human resources officer Violence: Not At Risk   Fear of Current or Ex-Partner: No   Emotionally Abused: No   Physically Abused: No   Sexually Abused: No    Outpatient Medications Prior to Visit  Medication Sig Dispense Refill   carvedilol (COREG) 3.125 MG tablet Take 1 tablet (3.125 mg total) by mouth 2 (two) times daily with a meal. (Patient taking differently: Take 3.125 mg by mouth 2 (two) times daily with a meal. ONCE EVERY OTHER DAY) 180  tablet 1   Cholecalciferol (VITAMIN D-3) 125 MCG (5000 UT) TABS Take 2 tablets by mouth daily.     Diclofenac-miSOPROStol 50-0.2 MG TBEC TAKE 1 TABLET BY MOUTH TWICE DAILY BETWEEN  MEALS  AS  NEEDED 60 tablet 0   losartan (COZAAR) 50 MG tablet Take 1 tablet (50 mg total) by mouth daily. (Patient taking differently: Take 50 mg by mouth daily. EVERY OTHER DAY) 90 tablet 1   NP THYROID 120 MG tablet  Take 1 tablet (120 mg total) by mouth daily before breakfast. (Patient taking differently: Take 120 mg by mouth daily before breakfast. Half a tablet every day) 30 tablet 2   amLODipine (NORVASC) 10 MG tablet Take 1 tablet (10 mg total) by mouth daily. (Patient taking differently: Take 10 mg by mouth daily. EVERY OTHER DAY) 90 tablet 1   No facility-administered medications prior to visit.    Allergies  Allergen Reactions   Lisinopril Swelling    Angioedema Angioedema Angioedema   Atorvastatin Other (See Comments)    Muscle aches   Muscle aches  Muscle aches     ROS Review of Systems  Constitutional:  Negative for chills and fever.  HENT:  Negative for congestion, sinus pressure, sinus pain and sore throat.   Eyes:  Negative for pain and discharge.  Respiratory:  Negative for cough and shortness of breath.   Cardiovascular:  Negative for chest pain and palpitations.  Gastrointestinal:  Negative for abdominal pain, constipation, diarrhea, nausea and vomiting.  Endocrine: Negative for polydipsia and polyuria.  Genitourinary:  Negative for dysuria and hematuria.  Musculoskeletal:  Positive for arthralgias. Negative for neck pain and neck stiffness.  Skin:  Negative for rash.  Neurological:  Negative for dizziness and weakness.  Psychiatric/Behavioral:  Negative for agitation and behavioral problems.      Objective:    Physical Exam Vitals reviewed.  Constitutional:      General: She is not in acute distress.    Appearance: She is obese. She is not diaphoretic.  HENT:     Head:  Normocephalic and atraumatic.     Nose: Nose normal.     Mouth/Throat:     Mouth: Mucous membranes are moist.  Eyes:     General: No scleral icterus.    Extraocular Movements: Extraocular movements intact.  Cardiovascular:     Rate and Rhythm: Normal rate and regular rhythm.     Pulses: Normal pulses.     Heart sounds: Normal heart sounds. No murmur heard. Pulmonary:     Breath sounds: Normal breath sounds. No wheezing or rales.  Abdominal:     Palpations: Abdomen is soft.     Tenderness: There is no abdominal tenderness.  Musculoskeletal:     Cervical back: Neck supple. No tenderness.     Right lower leg: No edema.     Left lower leg: No edema.  Skin:    General: Skin is warm.     Findings: No rash.  Neurological:     General: No focal deficit present.     Mental Status: She is alert and oriented to person, place, and time.     Cranial Nerves: No cranial nerve deficit.     Sensory: No sensory deficit.     Motor: No weakness.  Psychiatric:        Mood and Affect: Mood normal.        Behavior: Behavior normal.    BP 128/82 (BP Location: Left Arm, Patient Position: Sitting, Cuff Size: Normal)    Pulse (!) 43    Resp 18    Ht '5\' 3"'  (1.6 m)    Wt 177 lb 6.4 oz (80.5 kg)    SpO2 96%    BMI 31.42 kg/m  Wt Readings from Last 3 Encounters:  08/30/21 177 lb 6.4 oz (80.5 kg)  08/02/21 177 lb 3.2 oz (80.4 kg)  06/05/21 180 lb 3.2 oz (81.7 kg)    Lab Results  Component Value Date   TSH  0.109 (L) 08/30/2021   Lab Results  Component Value Date   WBC 8.7 04/24/2021   HGB 13.1 04/24/2021   HCT 40.7 04/24/2021   MCV 90 04/24/2021   PLT 283 04/24/2021   Lab Results  Component Value Date   NA 142 08/30/2021   K 4.3 08/30/2021   CO2 25 08/30/2021   GLUCOSE 95 08/30/2021   BUN 14 08/30/2021   CREATININE 0.86 08/30/2021   BILITOT 0.4 08/30/2021   ALKPHOS 108 08/30/2021   AST 29 08/30/2021   ALT 23 08/30/2021   PROT 7.4 08/30/2021   ALBUMIN 4.1 08/30/2021   CALCIUM 9.7  08/30/2021   ANIONGAP 12 07/22/2019   EGFR 71 08/30/2021   GFR 74.79 07/15/2013   Lab Results  Component Value Date   CHOL 218 (H) 12/05/2020   Lab Results  Component Value Date   HDL 45 (L) 12/05/2020   Lab Results  Component Value Date   LDLCALC 146 (H) 12/05/2020   Lab Results  Component Value Date   TRIG 141 12/05/2020   Lab Results  Component Value Date   CHOLHDL 4.8 12/05/2020   Lab Results  Component Value Date   HGBA1C 6.1 (H) 08/30/2021      Assessment & Plan:   Problem List Items Addressed This Visit    Hypothyroidism Lab Results  Component Value Date   TSH <0.005 (L) 04/24/2021   On NP thyroid 120 mg - takes 1 tablet and 1/2 tablet on alternate days Discussed about switching to Levothyroxine, she would prefer to stay on NP thyroid for now Has oversupplemented thyroid, had advised her to take 1/2 tablet QD, emphasized for compliance Check TSH and free T4  Encounter for general adult medical examination with abnormal findings Physical exam as documented. Counseling done  re healthy lifestyle involving commitment to 150 minutes exercise per week, heart healthy diet, and attaining healthy weight.The importance of adequate sleep also discussed. Changes in health habits are decided on by the patient with goals and time frames  set for achieving them. Immunization and cancer screening needs are specifically addressed at this visit.  Hypertension BP Readings from Last 1 Encounters:  08/30/21 128/82   Well-controlled with Amlodipine, Losartan and Coreg, but she takes them QOD, DC Amlodipine, advised to Losartan QD and Coreg BID Counseled for compliance with the medications Advised DASH diet and moderate exercise/walking, at least 150 mins/week   Nonischemic cardiomyopathy (HCC) On Coreg and Losartan Followed by Cardiology  Prediabetes Lab Results  Component Value Date   HGBA1C 6.1 (H) 08/30/2021   Advised to follow low carb diet  Other Visit  Diagnoses     Special screening for malignant neoplasms, colon       Relevant Orders   Ambulatory referral to Gastroenterology       No orders of the defined types were placed in this encounter.   Follow-up: Return in about 6 months (around 03/02/2022) for HTN and Hypothyroidism.    Lindell Spar, MD

## 2021-08-31 NOTE — Assessment & Plan Note (Signed)
BP Readings from Last 1 Encounters:  ?08/30/21 128/82  ? ?Well-controlled with Amlodipine, Losartan and Coreg, but she takes them QOD, DC Amlodipine, advised to Losartan QD and Coreg BID ?Counseled for compliance with the medications ?Advised DASH diet and moderate exercise/walking, at least 150 mins/week ? ?

## 2021-08-31 NOTE — Assessment & Plan Note (Signed)
Lab Results  Component Value Date   HGBA1C 6.1 (H) 08/30/2021   Advised to follow low carb diet 

## 2021-08-31 NOTE — Assessment & Plan Note (Signed)
On Coreg and Losartan ?Followed by Cardiology ?

## 2021-09-13 ENCOUNTER — Ambulatory Visit: Payer: PPO | Admitting: Cardiology

## 2021-09-18 ENCOUNTER — Telehealth: Payer: Self-pay

## 2021-09-18 ENCOUNTER — Encounter: Payer: Self-pay | Admitting: Gastroenterology

## 2021-10-04 ENCOUNTER — Telehealth: Payer: Self-pay

## 2021-10-04 NOTE — Telephone Encounter (Signed)
Patient called need med refill  ? ?Diclofenac-miSOPROStol 50-0.2 MG TBEC  ? ?The gel is not working need the pills ? ?Pharmacy ? ?[place], [place], [place] [address]  ?Phone:  [phone]  Fax:  [phone]   ? ?

## 2021-10-05 ENCOUNTER — Telehealth: Payer: Self-pay | Admitting: Internal Medicine

## 2021-10-05 ENCOUNTER — Other Ambulatory Visit: Payer: Self-pay

## 2021-10-05 DIAGNOSIS — M1712 Unilateral primary osteoarthritis, left knee: Secondary | ICD-10-CM

## 2021-10-05 MED ORDER — DICLOFENAC-MISOPROSTOL 50-0.2 MG PO TBEC: DELAYED_RELEASE_TABLET | ORAL | 0 refills | Status: DC

## 2021-10-05 NOTE — Telephone Encounter (Signed)
Refill sent patient aware  

## 2021-10-05 NOTE — Telephone Encounter (Signed)
Patient called back in regard to  ? ?Diclofenac-miSOPROStol 50-0.2 MG TBEC  ? ?Checking to see if meds have been / or will be sent in to [facility]  ?

## 2021-10-18 ENCOUNTER — Other Ambulatory Visit: Payer: Self-pay

## 2021-10-18 ENCOUNTER — Ambulatory Visit
Admission: EM | Admit: 2021-10-18 | Discharge: 2021-10-18 | Disposition: A | Discharge status: Home / Self Care | Payer: PPO | Attending: Family Medicine | Admitting: Family Medicine

## 2021-10-18 ENCOUNTER — Encounter: Payer: Self-pay | Admitting: Emergency Medicine

## 2021-10-18 DIAGNOSIS — I10 Essential (primary) hypertension: Secondary | ICD-10-CM

## 2021-10-18 DIAGNOSIS — R001 Bradycardia, unspecified: Secondary | ICD-10-CM

## 2021-10-18 NOTE — ED Triage Notes (Addendum)
Pt reports intermittent hypertension, dizziness, and neck swelling since Tuesday. Pt reports is already on HTN medication. Denies any chest pain, shortness of breath, etc. ?

## 2021-10-18 NOTE — Discharge Instructions (Signed)
Stop taking your COREG until we can be sure your heart rate isn't too low. ?

## 2021-10-23 ENCOUNTER — Encounter: Payer: Self-pay | Admitting: Internal Medicine

## 2021-10-23 ENCOUNTER — Ambulatory Visit (INDEPENDENT_AMBULATORY_CARE_PROVIDER_SITE_OTHER): Payer: PPO | Admitting: Internal Medicine

## 2021-10-23 VITALS — BP 140/84 | HR 61 | Resp 18 | Ht 63.0 in | Wt 176.2 lb

## 2021-10-23 DIAGNOSIS — I428 Other cardiomyopathies: Secondary | ICD-10-CM | POA: Diagnosis not present

## 2021-10-23 DIAGNOSIS — I10 Essential (primary) hypertension: Secondary | ICD-10-CM | POA: Diagnosis not present

## 2021-10-23 DIAGNOSIS — E039 Hypothyroidism, unspecified: Secondary | ICD-10-CM

## 2021-10-23 MED ORDER — LOSARTAN POTASSIUM 100 MG PO TABS: 100.0000 mg | ORAL_TABLET | Freq: Every day | ORAL | 1 refills | Status: DC

## 2021-10-23 NOTE — Progress Notes (Signed)
? ?Established Patient Office Visit ? ?Subjective:  ?Patient ID: Adrienne [name], female    DOB: [birth year]  Age: 76 y.o. MRN: [id number] ? ?CC:  ?Chief Complaint  ?Patient presents with  ? Follow-up  ?  Bp check pt bp has been high she did go to urgent care and was told to follow up with pcp   ? ? ?HPI ?Adrienne [name] is a 76 y.o. female with past medical history of HTN, nonishcemic CM, hypothyroidism, OA of hip and knee, HLD, breast ca. s/p right mastectomy who presents for f/u after recent urgent care visit for uncontrolled hypertension. ? ?HTN: She had an elevated BP readings at home, around 210s/110s and went to urgent care.  She denied any headache, dizziness, chest pain, dyspnea or palpitations.  Her BP in urgent care was 168/82 and HR was 38.  She was told to stop taking Coreg.  She continues to take losartan currently.  She has been having mild visual disturbance, but denies any dizziness currently. ? ?She has been taking half tablet of NP thyroid as advised since the last visit.  Of note, she has history of oversupplemented thyroid. ?  ? ? ? ? ? ? ?Past Medical History:  ?Diagnosis Date  ? Ambulates with cane   ? straight cane  ? Arthritis   ? knee, back  ? CHF (congestive heart failure) ([place])   ? 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 R Breast-mastectomy only  ? Gout   ? History of blood transfusion 1986  ? w/ Hysterectomy surgery  ? Hyperlipidemia   ? diet controlled - no meds  ? Hypertension   ? Hypothyroidism   ? Pelvic kidney   ? lower right pelvic kidney   ? Prediabetes   ? diet controlled - no meds  ? SBO (small bowel obstruction) ([place]) 10/2016  ? surgery   ? Sleep apnea   ? Mild - no mask needed per sleep study  ? Smoker   ? quit smoking 2018  ? ? ?Past Surgical History:  ?Procedure Laterality Date  ? ABDOMINAL HYSTERECTOMY  1986  ? COMPLETE-precancerous  ? APPENDECTOMY    ? pt states it was removed when gallbladder was removed.  ? BACK SURGERY    ? lower back  ? BREAST  LUMPECTOMY WITH RADIOACTIVE SEED LOCALIZATION Right 03/18/2019  ? Procedure: RIGHT BREAST LUMPECTOMY WITH RADIOACTIVE SEED LOCALIZATION;  Surgeon: Jovita Kussmaul, MD;  Location: [facility];  Service: General;  Laterality: Right;  ? CHOLECYSTECTOMY    ? COLONOSCOPY  02/15/2008  ? [name]   ? ECTOPIC PREGNANCY SURGERY    ? JOINT REPLACEMENT    ? Left hip total Dr. [name] 10-01-17  ? polypectomy-oropharynx    ? RE-EXCISION OF BREAST CANCER,SUPERIOR MARGINS Right 04/08/2019  ? Procedure: RE-EXCISION OF RIGHT BREAST ANTERIOR MARGINS;  Surgeon: Jovita Kussmaul, MD;  Location: [facility];  Service: General;  Laterality: Right;  ? right knee meniscus    ? SBO  10/2016  ? small bowel obstruction  ? SIMPLE MASTECTOMY WITH AXILLARY SENTINEL NODE BIOPSY Right 07/26/2019  ? Procedure: RIGHT MASTECTOMY WITH SENTINEL NODE BIOPSY;  Surgeon: Jovita Kussmaul, MD;  Location: [facility];  Service: General;  Laterality: Right;  ? TOTAL HIP ARTHROPLASTY Left 10/01/2017  ? Procedure: LEFT  TOTAL HIP ARTHROPLASTY ANTERIOR APPROACH;  Surgeon: [name], MD;  Location: [facility];  Service: Orthopedics;  Laterality: Left;  ? TOTAL HIP ARTHROPLASTY Right 03/11/2018  ? Procedure:  RIGHT TOTAL HIP ARTHROPLASTY ANTERIOR APPROACH;  Surgeon: [name], MD;  Location: [facility];  Service: Orthopedics;  Laterality: Right;  15mn  ? UPPER GASTROINTESTINAL ENDOSCOPY    ? ? ?Family History  ?Problem Relation Age of Onset  ? Cancer Sister   ? Diabetes Brother   ? Diabetes Brother   ? Hypertension Other   ? Breast cancer Sister   ? Colon cancer Neg Hx   ? Colon polyps Neg Hx   ? Rectal cancer Neg Hx   ? Stomach cancer Neg Hx   ? ? ?Social History  ? ?Socioeconomic History  ? Marital status: Widowed  ?  Spouse name: Not on file  ? Number of children: 3  ? Years of education: 140 ? Highest education level: Some college, no degree  ?Occupational History  ? Occupation: retired  ?Tobacco Use  ? Smoking status: Former  ?  Packs/day: 1.00  ?  Years: 32.00  ?   Pack years: 32.00  ?  Types: E-cigarettes, Cigarettes  ?  Quit date: 11/20/2016  ?  Years since quitting: 4.9  ? Smokeless tobacco: Never  ? Tobacco comments:  ?  Smoker since 1975 has quit and started back several times!!,  ?Vaping Use  ? Vaping Use: Former  ?Substance and Sexual Activity  ? Alcohol use: No  ? Drug use: No  ? Sexual activity: Not Currently  ?  Birth control/protection: Surgical  ?  Comment: Hysterectomy  ?Other Topics Concern  ? Not on file  ?Social History Narrative  ? Widow since 2006.Married previously for 25 years.Lives with grandson and 2 great grandchildren.Retired.Previously office work.  ? ?Social Determinants of Health  ? ?Financial Resource Strain: Low Risk   ? Difficulty of Paying Living Expenses: Not hard at all  ?Food Insecurity: No Food Insecurity  ? Worried About RCharity fundraiserin the Last Year: Never true  ? Ran Out of Food in the Last Year: Never true  ?Transportation Needs: No Transportation Needs  ? Lack of Transportation (Medical): No  ? Lack of Transportation (Non-Medical): No  ?Physical Activity: Inactive  ? Days of Exercise per Week: 0 days  ? Minutes of Exercise per Session: 0 min  ?Stress: No Stress Concern Present  ? Feeling of Stress : Not at all  ?Social Connections: Moderately Integrated  ? Frequency of Communication with Friends and Family: More than three times a week  ? Frequency of Social Gatherings with Friends and Family: Once a week  ? Attends Religious Services: More than 4 times per year  ? Active Member of Clubs or Organizations: Yes  ? Attends CArchivistMeetings: More than 4 times per year  ? Marital Status: Widowed  ?Intimate Partner Violence: Not At Risk  ? Fear of Current or Ex-Partner: No  ? Emotionally Abused: No  ? Physically Abused: No  ? Sexually Abused: No  ? ? ?Outpatient Medications Prior to Visit  ?Medication Sig Dispense Refill  ? Cholecalciferol (VITAMIN D-3) 125 MCG (5000 UT) TABS Take 2 tablets by mouth daily.    ?  Diclofenac-miSOPROStol 50-0.2 MG TBEC TAKE 1 TABLET BY MOUTH TWICE DAILY BETWEEN  MEALS  AS  NEEDED 60 tablet 0  ? NP THYROID 120 MG tablet Take 1 tablet (120 mg total) by mouth daily before breakfast. (Patient taking differently: Take 120 mg by mouth daily before breakfast. Half a tablet every day) 30 tablet 2  ? losartan (COZAAR) 50 MG tablet Take 1 tablet (50 mg  total) by mouth daily. (Patient taking differently: Take 50 mg by mouth daily. EVERY OTHER DAY) 90 tablet 1  ? carvedilol (COREG) 3.125 MG tablet Take 1 tablet (3.125 mg total) by mouth 2 (two) times daily with a meal. (Patient not taking: Reported on 10/23/2021) 180 tablet 1  ? ?No facility-administered medications prior to visit.  ? ? ?Allergies  ?Allergen Reactions  ? Lisinopril Swelling  ?  Angioedema ?Angioedema ?Angioedema  ? Atorvastatin Other (See Comments)  ?  Muscle aches  ? ?Muscle aches  ?Muscle aches   ? ? ?ROS ?Review of Systems  ?Constitutional:  Negative for chills and fever.  ?HENT:  Negative for congestion, sinus pressure, sinus pain and sore throat.   ?Eyes:  Positive for visual disturbance. Negative for pain and discharge.  ?Respiratory:  Negative for cough and shortness of breath.   ?Cardiovascular:  Negative for chest pain and palpitations.  ?Gastrointestinal:  Negative for abdominal pain, constipation, diarrhea, nausea and vomiting.  ?Endocrine: Negative for polydipsia and polyuria.  ?Genitourinary:  Negative for dysuria and hematuria.  ?Musculoskeletal:  Positive for arthralgias. Negative for neck pain and neck stiffness.  ?Skin:  Negative for rash.  ?Neurological:  Negative for dizziness and weakness.  ?Psychiatric/Behavioral:  Negative for agitation and behavioral problems.   ? ?  ?Objective:  ?  ?Physical Exam ?Vitals reviewed.  ?Constitutional:   ?   General: She is not in acute distress. ?   Appearance: She is obese. She is not diaphoretic.  ?HENT:  ?   Head: Normocephalic and atraumatic.  ?   Nose: Nose normal.  ?    Mouth/Throat:  ?   Mouth: Mucous membranes are moist.  ?Eyes:  ?   General: No scleral icterus. ?   Extraocular Movements: Extraocular movements intact.  ?Cardiovascular:  ?   Rate and Rhythm: Normal rate and regular rhythm.  ?   Pul

## 2021-10-23 NOTE — Assessment & Plan Note (Signed)
On Losartan Hold Coreg for now due to bradycardia Followed by Cardiology 

## 2021-10-23 NOTE — Assessment & Plan Note (Signed)
BP Readings from Last 1 Encounters:  ?10/23/21 140/84  ? ?Uncontrolled with Losartan 50 mg QD ?Coreg was recently held due to bradycardia at urgent care ?Increased dose of Losartan to 100 mg QD for now ?May need new BP device at home ?Counseled for compliance with the medications ?Advised DASH diet and moderate exercise/walking, at least 150 mins/week ? ?

## 2021-10-23 NOTE — Patient Instructions (Signed)
Please start taking Losartan 100 mg instead of 50 mg. Please hold Carvedilol for now  until you see Cardiologist. ? ?Please follow low salt diet and ambulate as tolerated. ?

## 2021-10-23 NOTE — ED Provider Notes (Signed)
?[facility] ? ? ?[id number] ?10/18/21 Arrival Time: 1204 ? ?ASSESSMENT & PLAN: ? ?1. Essential hypertension   ?2. Bradycardia   ? ?Discussed bradycardia. Needs to hold b-blocker until she can f/u with her cardiologist. Milda Smart if BP is a response to low HR. Discussed. No current lightheadedness. No CP/SOB. ?Declines ED evaluation. ? ?ECG: sinus bradycardia ? ?Chest pain precautions given. ?Reviewed expectations re: course of current medical issues. Questions answered. ?Outlined signs and symptoms indicating need for more acute intervention. ?Patient verbalized understanding. ?After Visit Summary given. ? ? ?SUBJECTIVE: ? ?History from: patient. ?Adrienne [name] is a 76 y.o. female who presents with complaint of elevated BP when checked at home; past few days; intermittent lightheaded feeling. Taking meds as directed. No CP/SOB. Normal vision. Normal PO intake without n/v/d. ? ? ?Social History  ? ?Tobacco Use  ?Smoking Status Former  ? Packs/day: 1.00  ? Years: 32.00  ? Pack years: 32.00  ? Types: E-cigarettes, Cigarettes  ? Quit date: 11/20/2016  ? Years since quitting: 4.9  ?Smokeless Tobacco Never  ?Tobacco Comments  ? Smoker since 1975 has quit and started back several times!!,  ? ?Social History  ? ?Substance and Sexual Activity  ?Alcohol Use No  ? ? ?OBJECTIVE: ? ?Vitals:  ? 10/18/21 1401 10/18/21 1402  ?BP: (!) 168/82   ?Pulse: (!) 38   ?Resp: 18   ?Temp: 98.4 ?F (36.9 ?C)   ?TempSrc: Oral   ?SpO2: 99%   ?Weight:  78 kg  ?Height:  '5\' 4"'$  (1.626 m)  ?  ?General appearance: alert, oriented, no acute distress ?Eyes: PERRLA; EOMI; conjunctivae normal ?HENT: normocephalic; atraumatic ?Neck: supple with FROM ?Lungs: without labored respirations; speaks full sentences without difficulty; CTAB ?Heart: bradycardic ?Chest Wall: without tenderness to palpation ?Abdomen: soft, non-tender; no guarding or rebound tenderness ?Extremities: without edema; without calf swelling or tenderness; symmetrical without  gross deformities ?Skin: warm and dry; without rash or lesions ?Neuro: normal gait ?Psychological: alert and cooperative; normal mood and affect ? ? ?Allergies  ?Allergen Reactions  ? Lisinopril Swelling  ?  Angioedema ?Angioedema ?Angioedema  ? Atorvastatin Other (See Comments)  ?  Muscle aches  ? ?Muscle aches  ?Muscle aches   ? ? ?Past Medical History:  ?Diagnosis Date  ? Ambulates with cane   ? straight cane  ? Arthritis   ? knee, back  ? CHF (congestive heart failure) ([place])   ? 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 R Breast-mastectomy only  ? Gout   ? History of blood transfusion 1986  ? w/ Hysterectomy surgery  ? Hyperlipidemia   ? diet controlled - no meds  ? Hypertension   ? Hypothyroidism   ? Pelvic kidney   ? lower right pelvic kidney   ? Prediabetes   ? diet controlled - no meds  ? SBO (small bowel obstruction) ([place]) 10/2016  ? surgery   ? Sleep apnea   ? Mild - no mask needed per sleep study  ? Smoker   ? quit smoking 2018  ? ?Social History  ? ?Socioeconomic History  ? Marital status: Widowed  ?  Spouse name: Not on file  ? Number of children: 3  ? Years of education: 47  ? Highest education level: Some college, no degree  ?Occupational History  ? Occupation: retired  ?Tobacco Use  ? Smoking status: Former  ?  Packs/day: 1.00  ?  Years: 32.00  ?  Pack years: 32.00  ?  Types: E-cigarettes,  Cigarettes  ?  Quit date: 11/20/2016  ?  Years since quitting: 4.9  ? Smokeless tobacco: Never  ? Tobacco comments:  ?  Smoker since 1975 has quit and started back several times!!,  ?Vaping Use  ? Vaping Use: Former  ?Substance and Sexual Activity  ? Alcohol use: No  ? Drug use: No  ? Sexual activity: Not Currently  ?  Birth control/protection: Surgical  ?  Comment: Hysterectomy  ?Other Topics Concern  ? Not on file  ?Social History Narrative  ? Widow since 2006.Married previously for 25 years.Lives with grandson and 2 great grandchildren.Retired.Previously office work.  ? ?Social Determinants of Health   ? ?Financial Resource Strain: Low Risk   ? Difficulty of Paying Living Expenses: Not hard at all  ?Food Insecurity: No Food Insecurity  ? Worried About Charity fundraiser in the Last Year: Never true  ? Ran Out of Food in the Last Year: Never true  ?Transportation Needs: No Transportation Needs  ? Lack of Transportation (Medical): No  ? Lack of Transportation (Non-Medical): No  ?Physical Activity: Inactive  ? Days of Exercise per Week: 0 days  ? Minutes of Exercise per Session: 0 min  ?Stress: No Stress Concern Present  ? Feeling of Stress : Not at all  ?Social Connections: Moderately Integrated  ? Frequency of Communication with Friends and Family: More than three times a week  ? Frequency of Social Gatherings with Friends and Family: Once a week  ? Attends Religious Services: More than 4 times per year  ? Active Member of Clubs or Organizations: Yes  ? Attends Archivist Meetings: More than 4 times per year  ? Marital Status: Widowed  ?Intimate Partner Violence: Not At Risk  ? Fear of Current or Ex-Partner: No  ? Emotionally Abused: No  ? Physically Abused: No  ? Sexually Abused: No  ? ?Family History  ?Problem Relation Age of Onset  ? Cancer Sister   ? Diabetes Brother   ? Diabetes Brother   ? Hypertension Other   ? Breast cancer Sister   ? Colon cancer Neg Hx   ? Colon polyps Neg Hx   ? Rectal cancer Neg Hx   ? Stomach cancer Neg Hx   ? ?Past Surgical History:  ?Procedure Laterality Date  ? ABDOMINAL HYSTERECTOMY  1986  ? COMPLETE-precancerous  ? APPENDECTOMY    ? pt states it was removed when gallbladder was removed.  ? BACK SURGERY    ? lower back  ? BREAST LUMPECTOMY WITH RADIOACTIVE SEED LOCALIZATION Right 03/18/2019  ? Procedure: RIGHT BREAST LUMPECTOMY WITH RADIOACTIVE SEED LOCALIZATION;  Surgeon: Jovita Kussmaul, MD;  Location: [facility];  Service: General;  Laterality: Right;  ? CHOLECYSTECTOMY    ? COLONOSCOPY  02/15/2008  ? [name]   ? ECTOPIC PREGNANCY SURGERY    ? JOINT REPLACEMENT    ? Left hip  total Dr. [name] 10-01-17  ? polypectomy-oropharynx    ? RE-EXCISION OF BREAST CANCER,SUPERIOR MARGINS Right 04/08/2019  ? Procedure: RE-EXCISION OF RIGHT BREAST ANTERIOR MARGINS;  Surgeon: Jovita Kussmaul, MD;  Location: [facility];  Service: General;  Laterality: Right;  ? right knee meniscus    ? SBO  10/2016  ? small bowel obstruction  ? SIMPLE MASTECTOMY WITH AXILLARY SENTINEL NODE BIOPSY Right 07/26/2019  ? Procedure: RIGHT MASTECTOMY WITH SENTINEL NODE BIOPSY;  Surgeon: Jovita Kussmaul, MD;  Location: [facility];  Service: General;  Laterality: Right;  ? TOTAL HIP  ARTHROPLASTY Left 10/01/2017  ? Procedure: LEFT  TOTAL HIP ARTHROPLASTY ANTERIOR APPROACH;  Surgeon: [name], MD;  Location[facility];  Service: Orthopedics;  Laterality: Left;  ? TOTAL HIP ARTHROPLASTY Right 03/11/2018  ? Procedure: RIGHT TOTAL HIP ARTHROPLASTY ANTERIOR APPROACH;  Surgeon: [name], MD;  [facility];  Service: Orthopedics;  Laterality: Right;  181mn  ? UPPER GASTROINTESTINAL ENDOSCOPY    ? ? ?  ?HVanessa Kick MD ?10/23/21 [phone]? ?

## 2021-10-23 NOTE — Assessment & Plan Note (Signed)
Lab Results  ?Component Value Date  ? TSH 0.109 (L) 08/30/2021  ? ?On NP thyroid 60 mg QD, had decreased dose in the last visit ?Discussed about switching to Levothyroxine, she would prefer to stay on NP thyroid for now ?Has oversupplemented thyroid, had advised her to take 1/2 tablet QD, emphasized for compliance ?Check TSH and free T4 ?

## 2021-10-24 LAB — BASIC METABOLIC PANEL
BUN/Creatinine Ratio: 15 (ref 12–28)
BUN: 17 mg/dL (ref 8–27)
CO2: 25 mmol/L (ref 20–29)
Calcium: 10.1 mg/dL (ref 8.7–10.3)
Chloride: 102 mmol/L (ref 96–106)
Creatinine, Ser: 1.17 mg/dL — ABNORMAL HIGH (ref 0.57–1.00)
Glucose: 92 mg/dL (ref 70–99)
Potassium: 4.5 mmol/L (ref 3.5–5.2)
Sodium: 140 mmol/L (ref 134–144)
eGFR: 49 mL/min/{1.73_m2} — ABNORMAL LOW (ref >59)

## 2021-10-24 LAB — TSH+FREE T4
Free T4: 0.74 ng/dL — ABNORMAL LOW (ref 0.82–1.77)
TSH: 4 u[IU]/mL (ref 0.450–4.500)

## 2021-10-30 DIAGNOSIS — R42 Dizziness and giddiness: Secondary | ICD-10-CM | POA: Diagnosis not present

## 2021-10-30 DIAGNOSIS — R001 Bradycardia, unspecified: Secondary | ICD-10-CM | POA: Diagnosis not present

## 2021-10-30 DIAGNOSIS — I10 Essential (primary) hypertension: Secondary | ICD-10-CM | POA: Diagnosis not present

## 2021-11-08 ENCOUNTER — Telehealth: Payer: Self-pay | Admitting: Cardiology

## 2021-11-08 NOTE — Telephone Encounter (Signed)
Called patient no answer.Left message on personal voice mail to call back. 

## 2021-11-08 NOTE — Telephone Encounter (Signed)
Pt c/o medication issue:  1. Name of Medication: carvedilol (COREG) 3.125 MG tablet   2. How are you currently taking this medication (dosage and times per day)? Patient is not taking   3. Are you having a reaction (difficulty breathing--STAT)? no  4. What is your medication issue? Patient's PCP took her off of this medication but she wanted to know if Dr. Percival Spanish is OK with her d/c this medicine

## 2021-11-12 DIAGNOSIS — R070 Pain in throat: Secondary | ICD-10-CM | POA: Diagnosis not present

## 2021-11-12 DIAGNOSIS — Z8669 Personal history of other diseases of the nervous system and sense organs: Secondary | ICD-10-CM | POA: Diagnosis not present

## 2021-11-12 DIAGNOSIS — J069 Acute upper respiratory infection, unspecified: Secondary | ICD-10-CM | POA: Diagnosis not present

## 2021-11-14 NOTE — Telephone Encounter (Signed)
Spoke with patient and stated she is still taking Losartan and has continued to HOLD on Coreg. Made her aware of why it was stopped and she agreed. She stated she thinks it is her HOME machine that is reading high but she is not really that high of a BP. She has not felt bad or had blurred vision or headaches. Encouraged to get machine calibrated or get a new machine to get better readings. No additional questions at this time.

## 2021-11-27 ENCOUNTER — Other Ambulatory Visit: Payer: Self-pay | Admitting: Internal Medicine

## 2021-11-27 ENCOUNTER — Ambulatory Visit (AMBULATORY_SURGERY_CENTER): Payer: PPO | Admitting: *Deleted

## 2021-11-27 VITALS — Ht 63.0 in | Wt 176.0 lb

## 2021-11-27 DIAGNOSIS — I428 Other cardiomyopathies: Secondary | ICD-10-CM

## 2021-11-27 DIAGNOSIS — Z86010 Personal history of colonic polyps: Secondary | ICD-10-CM

## 2021-11-27 DIAGNOSIS — I10 Essential (primary) hypertension: Secondary | ICD-10-CM

## 2021-11-27 NOTE — Progress Notes (Signed)
Patient's pre-visit was done today over the phone with the patient. Name,DOB and address verified. Patient denies any allergies to Eggs and Soy. Patient denies any problems with anesthesia/sedation. Patient is not taking any diet pills or blood thinners. No home Oxygen. Insurance confirmed with patient.  Prep instructions sent to pt's MyChart & mailed to pt-pt is aware. Patient understands to call us back with any questions or concerns. Patient is aware of our care-partner policy.   EMMI education assigned to the patient for the procedure, sent to [facility].

## 2021-11-29 ENCOUNTER — Telehealth: Payer: Self-pay | Admitting: Internal Medicine

## 2021-11-29 NOTE — Telephone Encounter (Signed)
Losartan 100 mg was sent to [facility] 10-23-21 with refills so this may be on the pharmacy behalf please let her know to call in the refill on the 100 mg

## 2021-11-29 NOTE — Telephone Encounter (Signed)
Pt called stating she was supposed to have Losartan '100mg'$  called into phar. States phar only has refills for the '50mg'$ . Can you please send in the refills for the '100mg'$  ?    [facility] [place]

## 2021-12-05 ENCOUNTER — Other Ambulatory Visit: Payer: Self-pay | Admitting: Internal Medicine

## 2021-12-05 DIAGNOSIS — I428 Other cardiomyopathies: Secondary | ICD-10-CM

## 2021-12-05 DIAGNOSIS — I10 Essential (primary) hypertension: Secondary | ICD-10-CM

## 2021-12-11 ENCOUNTER — Other Ambulatory Visit: Payer: Self-pay | Admitting: Internal Medicine

## 2021-12-11 DIAGNOSIS — M1712 Unilateral primary osteoarthritis, left knee: Secondary | ICD-10-CM

## 2021-12-13 ENCOUNTER — Encounter: Payer: Self-pay | Admitting: Gastroenterology

## 2021-12-18 ENCOUNTER — Ambulatory Visit (AMBULATORY_SURGERY_CENTER): Payer: PPO | Admitting: Gastroenterology

## 2021-12-18 ENCOUNTER — Encounter: Payer: Self-pay | Admitting: Gastroenterology

## 2021-12-18 VITALS — BP 120/84 | HR 51 | Temp 97.4°F | Resp 11 | Ht 63.0 in | Wt 176.0 lb

## 2021-12-18 DIAGNOSIS — I10 Essential (primary) hypertension: Secondary | ICD-10-CM | POA: Diagnosis not present

## 2021-12-18 DIAGNOSIS — Z86010 Personal history of colonic polyps: Secondary | ICD-10-CM

## 2021-12-18 DIAGNOSIS — Z09 Encounter for follow-up examination after completed treatment for conditions other than malignant neoplasm: Secondary | ICD-10-CM | POA: Diagnosis not present

## 2021-12-18 DIAGNOSIS — D123 Benign neoplasm of transverse colon: Secondary | ICD-10-CM | POA: Diagnosis not present

## 2021-12-18 DIAGNOSIS — G4733 Obstructive sleep apnea (adult) (pediatric): Secondary | ICD-10-CM | POA: Diagnosis not present

## 2021-12-18 MED ORDER — SODIUM CHLORIDE 0.9 % IV SOLN: 500.0000 mL | Freq: Once | INTRAVENOUS | Status: DC

## 2021-12-26 ENCOUNTER — Ambulatory Visit: Payer: PPO | Admitting: Internal Medicine

## 2022-01-07 ENCOUNTER — Encounter: Payer: Self-pay | Admitting: Gastroenterology

## 2022-01-08 ENCOUNTER — Encounter: Payer: Self-pay | Admitting: Internal Medicine

## 2022-01-08 ENCOUNTER — Ambulatory Visit (INDEPENDENT_AMBULATORY_CARE_PROVIDER_SITE_OTHER): Payer: PPO | Admitting: Internal Medicine

## 2022-01-08 VITALS — BP 136/82 | HR 47 | Resp 18 | Ht 63.0 in | Wt 182.6 lb

## 2022-01-08 DIAGNOSIS — I10 Essential (primary) hypertension: Secondary | ICD-10-CM

## 2022-01-08 DIAGNOSIS — E782 Mixed hyperlipidemia: Secondary | ICD-10-CM | POA: Diagnosis not present

## 2022-01-08 DIAGNOSIS — R7303 Prediabetes: Secondary | ICD-10-CM

## 2022-01-08 DIAGNOSIS — E039 Hypothyroidism, unspecified: Secondary | ICD-10-CM

## 2022-01-08 DIAGNOSIS — I428 Other cardiomyopathies: Secondary | ICD-10-CM

## 2022-01-08 MED ORDER — LOSARTAN POTASSIUM 100 MG PO TABS: 100.0000 mg | ORAL_TABLET | Freq: Every day | ORAL | 3 refills | Status: DC

## 2022-01-08 NOTE — Patient Instructions (Signed)
Please continue taking medications as prescribed.  Please continue to follow low salt diet and ambulate as tolerated. 

## 2022-01-08 NOTE — Assessment & Plan Note (Signed)
Lab Results  Component Value Date   HGBA1C 6.1 (H) 08/30/2021   Advised to follow low carb diet

## 2022-01-08 NOTE — Assessment & Plan Note (Signed)
BP Readings from Last 1 Encounters:  01/08/22 136/82   Well-controlled with Losartan 100 mg QD Counseled for compliance with the medications Advised DASH diet and moderate exercise/walking, at least 150 mins/week

## 2022-01-08 NOTE — Progress Notes (Signed)
Established Patient Office Visit  Subjective:  Patient ID: Adrienne [name], female    DOB: February 02, 1946  Age: 76 y.o. MRN: [id number]  CC:  Chief Complaint  Patient presents with   Follow-up    Follow up HTN and hypothyroidism     HPI Adrienne [name] is a 76 y.o. female with past medical history of HTN, nonishcemic CM, hypothyroidism, OA of hip and knee, HLD, breast ca. s/p right mastectomy who presents for f/u of her chronic medical conditions.  HTN: BP is well-controlled now. Takes medications regularly. Patient denies headache, dizziness, chest pain, dyspnea or palpitations.  Hypothyroidism: She takes NP thyroid 60 mg daily currently.  She has history of oversupplemented thyroid in the past.  She denies any recent change in appetite, constipation, tremors or palpitations.  Past Medical History:  Diagnosis Date   Allergy    Ambulates with cane    straight cane   Arthritis    knee, back   CHF (congestive heart failure) ([place])    Ductal carcinoma in situ of breast 12/2018   R Breast-mastectomy only   Gout    History of blood transfusion 1986   w/ Hysterectomy surgery   Hyperlipidemia    diet controlled - no meds   Hypertension    Hypothyroidism    Pelvic kidney    lower right pelvic kidney    Prediabetes    diet controlled - no meds   SBO (small bowel obstruction) ([place]) 10/2016   surgery    Sleep apnea    Mild - no mask needed per sleep study   Smoker    quit smoking 2018    Past Surgical History:  Procedure Laterality Date   [place]     pt states it was removed when gallbladder was removed.   BACK SURGERY     lower back   BREAST LUMPECTOMY WITH RADIOACTIVE SEED LOCALIZATION Right 03/18/2019   Procedure: RIGHT BREAST LUMPECTOMY WITH RADIOACTIVE SEED LOCALIZATION;  Surgeon: Jovita Kussmaul, MD;  Location: [place];  Service: General;  Laterality: Right;   CHOLECYSTECTOMY     COLONOSCOPY   02/15/2008   [facility]    COLONOSCOPY WITH PROPOFOL  02/27/2018   Dr.Nandigam   ECTOPIC PREGNANCY SURGERY     JOINT REPLACEMENT     Left hip total Dr. [name] 10-01-17   POLYPECTOMY     polypectomy-oropharynx     RE-EXCISION OF BREAST CANCER,SUPERIOR MARGINS Right 04/08/2019   Procedure: RE-EXCISION OF RIGHT BREAST ANTERIOR MARGINS;  Surgeon: [name], MD;  Location: [facility];  Service: General;  Laterality: Right;   right knee meniscus     SBO  10/2016   small bowel obstruction   SIMPLE MASTECTOMY WITH AXILLARY SENTINEL NODE BIOPSY Right 07/26/2019   Procedure: RIGHT MASTECTOMY WITH SENTINEL NODE BIOPSY;  Surgeon: Jovita Kussmaul, MD;  Location: [place];  Service: General;  Laterality: Right;   TOTAL HIP ARTHROPLASTY Left 10/01/2017   Procedure: LEFT  TOTAL HIP ARTHROPLASTY ANTERIOR APPROACH;  Surgeon: [name], MD;  Location: [facility];  Service: Orthopedics;  Laterality: Left;   TOTAL HIP ARTHROPLASTY Right 03/11/2018   Procedure: RIGHT TOTAL HIP ARTHROPLASTY ANTERIOR APPROACH;  Surgeon: [name], MD;  Location: [facility];  Service: Orthopedics;  Laterality: Right;  131mn   UPPER GASTROINTESTINAL ENDOSCOPY      Family History  Problem Relation Age of Onset   Cancer Sister    Diabetes Brother  Diabetes Brother    Hypertension Other    Breast cancer Sister    Colon cancer Neg Hx    Colon polyps Neg Hx    Rectal cancer Neg Hx    Stomach cancer Neg Hx     Social History   Socioeconomic History   Marital status: Widowed    Spouse name: Not on file   Number of children: 3   Years of education: 54   Highest education level: Some college, no degree  Occupational History   Occupation: retired  Tobacco Use   Smoking status: Former    Packs/day: 1.00    Years: 32.00    Total pack years: 32.00    Types: E-cigarettes, Cigarettes    Quit date: 11/20/2016    Years since quitting: 5.1   Smokeless tobacco: Never   Tobacco comments:    Smoker since  1975 has quit and started back several times!!,  Vaping Use   Vaping Use: Former  Substance and Sexual Activity   Alcohol use: No   Drug use: No   Sexual activity: Not Currently    Birth control/protection: Surgical    Comment: Hysterectomy  Other Topics Concern   Not on file  Social History Narrative   Widow since 2006.Married previously for 25 years.Lives with grandson and 2 great grandchildren.Retired.Previously office work.   Social Determinants of Health   Financial Resource Strain: Low Risk  (06/27/2021)   Overall Financial Resource Strain (CARDIA)    Difficulty of Paying Living Expenses: Not hard at all  Food Insecurity: No Food Insecurity (06/27/2021)   Hunger Vital Sign    Worried About Running Out of Food in the Last Year: Never true    Ran Out of Food in the Last Year: Never true  Transportation Needs: No Transportation Needs (06/27/2021)   PRAPARE - Hydrologist (Medical): No    Lack of Transportation (Non-Medical): No  Physical Activity: Inactive (06/27/2021)   Exercise Vital Sign    Days of Exercise per Week: 0 days    Minutes of Exercise per Session: 0 min  Stress: No Stress Concern Present (06/27/2021)   [place]    Feeling of Stress : Not at all  Social Connections: Moderately Integrated (06/27/2021)   Social Connection and Isolation Panel [NHANES]    Frequency of Communication with Friends and Family: More than three times a week    Frequency of Social Gatherings with Friends and Family: Once a week    Attends Religious Services: More than 4 times per year    Active Member of [employer] or Organizations: Yes    Attends Archivist Meetings: More than 4 times per year    Marital Status: Widowed  Intimate Partner Violence: Not At Risk (06/27/2021)   Humiliation, Afraid, Rape, and Kick questionnaire    Fear of Current or Ex-Partner: No    Emotionally Abused: No     Physically Abused: No    Sexually Abused: No    Outpatient Medications Prior to Visit  Medication Sig Dispense Refill   Cholecalciferol (VITAMIN D-3) 125 MCG (5000 UT) TABS Take 2 tablets by mouth daily.     Diclofenac-miSOPROStol 50-0.2 MG TBEC TAKE 1 TABLET BY MOUTH TWICE DAILY BETWEEN MEALS AS NEEDED 60 tablet 0   NP THYROID 120 MG tablet Take 1 tablet (120 mg total) by mouth daily before breakfast. (Patient taking differently: Take 120 mg by mouth daily before breakfast. Half  a tablet every day) 30 tablet 2   losartan (COZAAR) 100 MG tablet Take 1 tablet by mouth once daily 90 tablet 0   No facility-administered medications prior to visit.    Allergies  Allergen Reactions   Lisinopril Swelling    Angioedema Angioedema Angioedema   Atorvastatin Other (See Comments)    Muscle aches   Muscle aches  Muscle aches     ROS Review of Systems  Constitutional:  Negative for chills and fever.  HENT:  Negative for congestion, sinus pressure, sinus pain and sore throat.   Eyes:  Negative for pain and discharge.  Respiratory:  Negative for cough and shortness of breath.   Cardiovascular:  Negative for chest pain and palpitations.  Gastrointestinal:  Negative for abdominal pain, constipation, diarrhea, nausea and vomiting.  Endocrine: Negative for polydipsia and polyuria.  Genitourinary:  Negative for dysuria and hematuria.  Musculoskeletal:  Positive for arthralgias. Negative for neck pain and neck stiffness.  Skin:  Negative for rash.  Neurological:  Negative for dizziness and weakness.  Psychiatric/Behavioral:  Negative for agitation and behavioral problems.       Objective:    Physical Exam Vitals reviewed.  Constitutional:      General: She is not in acute distress.    Appearance: She is obese. She is not diaphoretic.  HENT:     Head: Normocephalic and atraumatic.     Nose: Nose normal.     Mouth/Throat:     Mouth: Mucous membranes are moist.  Eyes:     General: No  scleral icterus.    Extraocular Movements: Extraocular movements intact.  Cardiovascular:     Rate and Rhythm: Normal rate and regular rhythm.     Pulses: Normal pulses.     Heart sounds: Normal heart sounds. No murmur heard. Pulmonary:     Breath sounds: Normal breath sounds. No wheezing or rales.  Musculoskeletal:     Cervical back: Neck supple. No tenderness.     Right lower leg: No edema.     Left lower leg: No edema.  Skin:    General: Skin is warm.     Findings: No rash.  Neurological:     General: No focal deficit present.     Mental Status: She is alert and oriented to person, place, and time.     Sensory: No sensory deficit.     Motor: No weakness.  Psychiatric:        Mood and Affect: Mood normal.        Behavior: Behavior normal.     BP 136/82 (BP Location: Right Arm, Patient Position: Sitting, Cuff Size: Normal)   Pulse (!) 47   Resp 18   Ht _0  (1.6 m)   Wt 182 lb 9.6 oz (82.8 kg)   SpO2 99%   BMI 32.35 kg/m  Wt Readings from Last 3 Encounters:  01/08/22 182 lb 9.6 oz (82.8 kg)  12/18/21 176 lb (79.8 kg)  11/27/21 176 lb (79.8 kg)    Lab Results  Component Value Date   TSH 4.000 10/23/2021   Lab Results  Component Value Date   WBC 8.7 04/24/2021   HGB 13.1 04/24/2021   HCT 40.7 04/24/2021   MCV 90 04/24/2021   PLT 283 04/24/2021   Lab Results  Component Value Date   NA 140 10/23/2021   K 4.5 10/23/2021   CO2 25 10/23/2021   GLUCOSE 92 10/23/2021   BUN 17 10/23/2021   CREATININE 1.17 (H) 10/23/2021  BILITOT 0.4 08/30/2021   ALKPHOS 108 08/30/2021   AST 29 08/30/2021   ALT 23 08/30/2021   PROT 7.4 08/30/2021   ALBUMIN 4.1 08/30/2021   CALCIUM 10.1 10/23/2021   ANIONGAP 12 07/22/2019   EGFR 49 (L) 10/23/2021   GFR 74.79 07/15/2013   Lab Results  Component Value Date   CHOL 218 (H) 12/05/2020   Lab Results  Component Value Date   HDL 45 (L) 12/05/2020   Lab Results  Component Value Date   LDLCALC 146 (H) 12/05/2020   Lab  Results  Component Value Date   TRIG 141 12/05/2020   Lab Results  Component Value Date   CHOLHDL 4.8 12/05/2020   Lab Results  Component Value Date   HGBA1C 6.1 (H) 08/30/2021      Assessment & Plan:   Problem List Items Addressed This Visit       Cardiovascular and Mediastinum   Hypertension - Primary    BP Readings from Last 1 Encounters:  01/08/22 136/82  Well-controlled with Losartan 100 mg QD Counseled for compliance with the medications Advised DASH diet and moderate exercise/walking, at least 150 mins/week       Relevant Medications   losartan (COZAAR) 100 MG tablet   Nonischemic cardiomyopathy (HCC)    On Losartan Hold Coreg for now due to bradycardia Followed by Cardiology      Relevant Medications   losartan (COZAAR) 100 MG tablet     Endocrine   Hypothyroidism    Lab Results  Component Value Date   TSH 4.000 10/23/2021  On NP thyroid 60 mg QD, had decreased dose in the past Discussed about switching to Levothyroxine, she would prefer to stay on NP thyroid for now Has oversupplemented thyroid, had advised her to take 1/2 tablet QD, emphasized for compliance Check TSH and free T4      Relevant Orders   TSH + free T4     Other   HLD (hyperlipidemia)   Relevant Medications   losartan (COZAAR) 100 MG tablet   Other Relevant Orders   Lipid Profile   Prediabetes    Lab Results  Component Value Date   HGBA1C 6.1 (H) 08/30/2021  Advised to follow low carb diet      Relevant Orders   CMP14+EGFR   Hemoglobin A1c    Meds ordered this encounter  Medications   losartan (COZAAR) 100 MG tablet    Sig: Take 1 tablet (100 mg total) by mouth daily.    Dispense:  90 tablet    Refill:  3    Follow-up: Return in about 4 months (around 05/11/2022) for HTN and hypothyroidism.    Lindell Spar, MD

## 2022-01-08 NOTE — Assessment & Plan Note (Signed)
Lab Results  Component Value Date   TSH 4.000 10/23/2021   On NP thyroid 60 mg QD, had decreased dose in the past Discussed about switching to Levothyroxine, she would prefer to stay on NP thyroid for now Has oversupplemented thyroid, had advised her to take 1/2 tablet QD, emphasized for compliance Check TSH and free T4

## 2022-01-08 NOTE — Assessment & Plan Note (Signed)
On Losartan Hold Coreg for now due to bradycardia Followed by Cardiology

## 2022-01-09 ENCOUNTER — Telehealth: Payer: Self-pay | Admitting: Internal Medicine

## 2022-01-09 ENCOUNTER — Encounter: Payer: Self-pay | Admitting: Internal Medicine

## 2022-01-09 ENCOUNTER — Other Ambulatory Visit: Payer: Self-pay | Admitting: Internal Medicine

## 2022-01-09 DIAGNOSIS — E039 Hypothyroidism, unspecified: Secondary | ICD-10-CM

## 2022-01-09 LAB — LIPID PANEL
Chol/HDL Ratio: 4.2 ratio (ref 0.0–4.4)
Cholesterol, Total: 258 mg/dL — ABNORMAL HIGH (ref 100–199)
HDL: 62 mg/dL (ref >39)
LDL Chol Calc (NIH): 172 mg/dL — ABNORMAL HIGH (ref 0–99)
Triglycerides: 136 mg/dL (ref 0–149)
VLDL Cholesterol Cal: 24 mg/dL (ref 5–40)

## 2022-01-09 LAB — TSH+FREE T4
Free T4: 0.63 ng/dL — ABNORMAL LOW (ref 0.82–1.77)
TSH: 4.9 u[IU]/mL — ABNORMAL HIGH (ref 0.450–4.500)

## 2022-01-09 LAB — CMP14+EGFR
ALT: 28 IU/L (ref 0–32)
AST: 28 IU/L (ref 0–40)
Albumin/Globulin Ratio: 1.5 (ref 1.2–2.2)
Albumin: 4 g/dL (ref 3.8–4.8)
Alkaline Phosphatase: 96 IU/L (ref 44–121)
BUN/Creatinine Ratio: 13 (ref 12–28)
BUN: 12 mg/dL (ref 8–27)
Bilirubin Total: 0.4 mg/dL (ref 0.0–1.2)
CO2: 23 mmol/L (ref 20–29)
Calcium: 9.1 mg/dL (ref 8.7–10.3)
Chloride: 106 mmol/L (ref 96–106)
Creatinine, Ser: 0.94 mg/dL (ref 0.57–1.00)
Globulin, Total: 2.7 g/dL (ref 1.5–4.5)
Glucose: 100 mg/dL — ABNORMAL HIGH (ref 70–99)
Potassium: 4.5 mmol/L (ref 3.5–5.2)
Sodium: 143 mmol/L (ref 134–144)
Total Protein: 6.7 g/dL (ref 6.0–8.5)
eGFR: 64 mL/min/{1.73_m2} (ref >59)

## 2022-01-09 LAB — HEMOGLOBIN A1C
Est. average glucose Bld gHb Est-mCnc: 131 mg/dL
Hgb A1c MFr Bld: 6.2 % — ABNORMAL HIGH (ref 4.8–5.6)

## 2022-01-09 MED ORDER — THYROID 90 MG PO TABS: 90.0000 mg | ORAL_TABLET | Freq: Every day | ORAL | 5 refills | Status: DC

## 2022-01-09 NOTE — Telephone Encounter (Signed)
Returned call went over labs.  

## 2022-01-09 NOTE — Telephone Encounter (Signed)
Pt returning call

## 2022-01-15 ENCOUNTER — Encounter: Payer: Self-pay | Admitting: Orthopedic Surgery

## 2022-01-15 ENCOUNTER — Ambulatory Visit: Payer: PPO | Admitting: Orthopedic Surgery

## 2022-01-15 VITALS — Ht 63.0 in | Wt 182.0 lb

## 2022-01-15 DIAGNOSIS — M170 Bilateral primary osteoarthritis of knee: Secondary | ICD-10-CM

## 2022-01-15 DIAGNOSIS — D0511 Intraductal carcinoma in situ of right breast: Secondary | ICD-10-CM | POA: Diagnosis not present

## 2022-01-15 DIAGNOSIS — M1712 Unilateral primary osteoarthritis, left knee: Secondary | ICD-10-CM

## 2022-01-15 NOTE — Patient Instructions (Signed)

## 2022-01-16 ENCOUNTER — Encounter: Payer: Self-pay | Admitting: Orthopedic Surgery

## 2022-01-16 NOTE — Progress Notes (Signed)
Return Patient Visit  Assessment: Adrienne [name] is a 76 y.o. female with the following: Bilateral knee arthritis  Plan: Adrienne [name] has advanced arthritis in both knees.  She has done well with prior steroid injections.  She would like to proceed today.  This was completed in clinic without issue.  Follow-up as needed.  She cannot return for repeat injections any sooner than 3 months.  Procedure note injection Left knee joint   Verbal consent was obtained to inject the left knee joint  Timeout was completed to confirm the site of injection.  The skin was prepped with alcohol and ethyl chloride was sprayed at the injection site.  A 21-gauge needle was used to inject 40 mg of Depo-Medrol and 1% lidocaine (3 cc) into the left knee using an anterolateral approach.  There were no complications. A sterile bandage was applied.     Procedure note injection Right knee joint   Verbal consent was obtained to inject the right knee joint  Timeout was completed to confirm the site of injection.  The skin was prepped with alcohol and ethyl chloride was sprayed at the injection site.  A 21-gauge needle was used to inject 40 mg of Depo-Medrol and 1% lidocaine (3 cc) into the left knee using an anterolateral approach.  There were no complications. A sterile bandage was applied.     Follow-up: Return if symptoms worsen or fail to improve.  Subjective:  Chief Complaint  Patient presents with   Knee Pain    Lt knee pain for 2 months.    History of Present Illness: Adrienne [name] is a 76 y.o. female who returns to clinic today for repeat evaluation of bilateral knee pain.  She states her left knee is worse than her right.  She has previously had injections, with excellent relief of her symptoms.  However, over the past couple months, she has noticed progressive worsening pain, especially within the left knee.  The pain is along the medial joint line bilaterally.  No recent  injuries.  She has been taking over-the-counter medications as needed.  Review of Systems: No fevers or chills No numbness or tingling No chest pain No shortness of breath No bowel or bladder dysfunction No GI distress No headaches  Objective: Ht '5\' 3"'$  (1.6 m)   Wt 182 lb (82.6 kg)   BMI 32.24 kg/m   Physical Exam:  General: Alert and oriented, no acute distress. Gait: Severely limited, uses a cane.  Slow.  Evaluation of the left knee demonstrates no effusion.  15 degree flexion contracture.  Tenderness palpation along the medial joint line.  Crepitus is appreciated.  Negative Lachman.  No increased instability varus valgus stress.  Evaluation of the right knee demonstrates no effusion.  5 degree flexion contracture.  Flexion to greater than 110 degrees with minimal discomfort.  Tenderness palpation along the medial joint line.  Crepitus is appreciated.  Negative Lachman.  IMAGING:  No new imaging obtained today.   New Medications:  No orders of the defined types were placed in this encounter.     Mordecai Rasmussen, MD  01/16/2022 4:57 PM

## 2022-02-04 ENCOUNTER — Other Ambulatory Visit: Payer: Self-pay | Admitting: General Surgery

## 2022-02-04 DIAGNOSIS — Z1231 Encounter for screening mammogram for malignant neoplasm of breast: Secondary | ICD-10-CM

## 2022-02-19 ENCOUNTER — Other Ambulatory Visit: Payer: Self-pay | Admitting: *Deleted

## 2022-02-19 ENCOUNTER — Telehealth: Payer: Self-pay | Admitting: Internal Medicine

## 2022-02-19 ENCOUNTER — Other Ambulatory Visit: Payer: Self-pay | Admitting: Internal Medicine

## 2022-02-19 DIAGNOSIS — M1712 Unilateral primary osteoarthritis, left knee: Secondary | ICD-10-CM

## 2022-02-19 MED ORDER — CELECOXIB 100 MG PO CAPS: 100.0000 mg | ORAL_CAPSULE | Freq: Two times a day (BID) | ORAL | 2 refills | Status: DC | PRN

## 2022-02-19 NOTE — Telephone Encounter (Signed)
Per patient send to [facility] in [place] sent over with verbal understanding

## 2022-02-19 NOTE — Telephone Encounter (Signed)
[facility] called stating pt is unable to afford Diclofenac-miSOPROStol 50-0.2 MG TBEC. Can this please be changed to something else?       [place]  Fax # [phone]

## 2022-02-21 DIAGNOSIS — L2389 Allergic contact dermatitis due to other agents: Secondary | ICD-10-CM | POA: Diagnosis not present

## 2022-02-28 ENCOUNTER — Ambulatory Visit
Admission: RE | Admit: 2022-02-28 | Discharge: 2022-02-28 | Disposition: A | Discharge status: Home / Self Care | Payer: PPO | Source: Ambulatory Visit | Attending: General Surgery | Admitting: General Surgery

## 2022-02-28 DIAGNOSIS — Z1231 Encounter for screening mammogram for malignant neoplasm of breast: Secondary | ICD-10-CM | POA: Diagnosis not present

## 2022-02-28 DIAGNOSIS — C50911 Malignant neoplasm of unspecified site of right female breast: Secondary | ICD-10-CM | POA: Diagnosis not present

## 2022-03-05 ENCOUNTER — Ambulatory Visit (INDEPENDENT_AMBULATORY_CARE_PROVIDER_SITE_OTHER): Payer: PPO | Admitting: Internal Medicine

## 2022-03-05 ENCOUNTER — Encounter: Payer: Self-pay | Admitting: Internal Medicine

## 2022-03-05 VITALS — BP 158/84 | HR 64 | Resp 18 | Ht 63.0 in | Wt 184.6 lb

## 2022-03-05 DIAGNOSIS — Z23 Encounter for immunization: Secondary | ICD-10-CM | POA: Diagnosis not present

## 2022-03-05 DIAGNOSIS — I10 Essential (primary) hypertension: Secondary | ICD-10-CM

## 2022-03-05 DIAGNOSIS — M1712 Unilateral primary osteoarthritis, left knee: Secondary | ICD-10-CM | POA: Diagnosis not present

## 2022-03-05 DIAGNOSIS — E039 Hypothyroidism, unspecified: Secondary | ICD-10-CM | POA: Diagnosis not present

## 2022-03-05 DIAGNOSIS — E782 Mixed hyperlipidemia: Secondary | ICD-10-CM

## 2022-03-05 MED ORDER — AMLODIPINE BESYLATE 10 MG PO TABS: 10.0000 mg | ORAL_TABLET | Freq: Every day | ORAL | 1 refills | Status: DC

## 2022-03-05 MED ORDER — MISC. DEVICES MISC: 0 refills | Status: DC

## 2022-03-05 NOTE — Assessment & Plan Note (Addendum)
BP Readings from Last 1 Encounters:  03/05/22 (!) 158/84   Uncontrolled with Losartan 100 mg QD Restarted Amlodipine 10 mg QD, she was on it in the past Counseled for compliance with the medications Advised DASH diet and moderate exercise/walking, at least 150 mins/week  Needs a new BP device - Rx given

## 2022-03-05 NOTE — Progress Notes (Signed)
Established Patient Office Visit  Subjective:  Patient ID: Adrienne [name], female    DOB: June 18, 1946  Age: 76 y.o. MRN: [id number]  CC:  Chief Complaint  Patient presents with   Follow-up    6 month follow up HTN bp cuff from home not accurate brought in and checked against manual and its reading too high     HPI Adrienne [name] is a 76 y.o. female with past medical history of HTN, nonishcemic CM, hypothyroidism, OA of hip and knee, HLD, breast ca. s/p right mastectomy who presents for f/u of her chronic medical conditions.  HTN: BP is elevated now. Takes Losartan regularly. Coreg was discontinued due to bradycardia. Patient denies headache, dizziness, chest pain, dyspnea or palpitations.  Hypothyroidism: She takes NP thyroid 90 mg daily currently.  She has history of oversupplemented thyroid in the past.  She denies any recent change in appetite, constipation, tremors or palpitations. She has noticed weight gain recently, about 8 lbs since 05/23.    Past Medical History:  Diagnosis Date   Allergy    Ambulates with cane    straight cane   Arthritis    knee, back   CHF (congestive heart failure) ([place])    Ductal carcinoma in situ of breast 12/2018   R Breast-mastectomy only   Gout    History of blood transfusion 1986   w/ Hysterectomy surgery   Hyperlipidemia    diet controlled - no meds   Hypertension    Hypothyroidism    Pelvic kidney    lower right pelvic kidney    Prediabetes    diet controlled - no meds   SBO (small bowel obstruction) ([place]) 10/2016   surgery    Sleep apnea    Mild - no mask needed per sleep study   Smoker    quit smoking 2018    Past Surgical History:  Procedure Laterality Date   [facility]     pt states it was removed when gallbladder was removed.   BACK SURGERY     lower back   BREAST BIOPSY Right 05/12/2019   times 2   BREAST LUMPECTOMY WITH RADIOACTIVE SEED  LOCALIZATION Right 03/18/2019   Procedure: RIGHT BREAST LUMPECTOMY WITH RADIOACTIVE SEED LOCALIZATION;  Surgeon: Jovita Kussmaul, MD;  Location: [facility];  Service: General;  Laterality: Right;   CHOLECYSTECTOMY     COLONOSCOPY  02/15/2008   [facility]    COLONOSCOPY WITH PROPOFOL  02/27/2018   Dr.Nandigam   ECTOPIC PREGNANCY SURGERY     JOINT REPLACEMENT     Left hip total Dr. [name] 10-01-17   MASTECTOMY Right 07/26/2019   POLYPECTOMY     polypectomy-oropharynx     RE-EXCISION OF BREAST CANCER,SUPERIOR MARGINS Right 04/08/2019   Procedure: RE-EXCISION OF RIGHT BREAST ANTERIOR MARGINS;  Surgeon: [name], MD;  Location: [facility];  Service: General;  Laterality: Right;   right knee meniscus     SBO  10/2016   small bowel obstruction   SIMPLE MASTECTOMY WITH AXILLARY SENTINEL NODE BIOPSY Right 07/26/2019   Procedure: RIGHT MASTECTOMY WITH SENTINEL NODE BIOPSY;  Surgeon: Jovita Kussmaul, MD;  Location: [facility];  Service: General;  Laterality: Right;   TOTAL HIP ARTHROPLASTY Left 10/01/2017   Procedure: LEFT  TOTAL HIP ARTHROPLASTY ANTERIOR APPROACH;  Surgeon: [name], MD;  Location: [facility];  Service: Orthopedics;  Laterality: Left;   TOTAL HIP ARTHROPLASTY Right 03/11/2018  Procedure: RIGHT TOTAL HIP ARTHROPLASTY ANTERIOR APPROACH;  Surgeon: [name], MD;  Location: [facility];  Service: Orthopedics;  Laterality: Right;  147mn   UPPER GASTROINTESTINAL ENDOSCOPY      Family History  Problem Relation Age of Onset   Cancer Sister    Diabetes Brother    Diabetes Brother    Hypertension Other    Breast cancer Sister    Colon cancer Neg Hx    Colon polyps Neg Hx    Rectal cancer Neg Hx    Stomach cancer Neg Hx     Social History   Socioeconomic History   Marital status: Widowed    Spouse name: Not on file   Number of children: 3   Years of education: 15  Highest education level: Some college, no degree  Occupational History   Occupation: retired   Tobacco Use   Smoking status: Former    Packs/day: 1.00    Years: 32.00    Total pack years: 32.00    Types: E-cigarettes, Cigarettes    Quit date: 11/20/2016    Years since quitting: 5.2   Smokeless tobacco: Never   Tobacco comments:    Smoker since 1975 has quit and started back several times!!,  Vaping Use   Vaping Use: Former  Substance and Sexual Activity   Alcohol use: No   Drug use: No   Sexual activity: Not Currently    Birth control/protection: Surgical    Comment: Hysterectomy  Other Topics Concern   Not on file  Social History Narrative   Widow since 2006.Married previously for 25 years.Lives with grandson and 2 great grandchildren.Retired.Previously office work.   Social Determinants of Health   Financial Resource Strain: Low Risk  (06/27/2021)   Overall Financial Resource Strain (CARDIA)    Difficulty of Paying Living Expenses: Not hard at all  Food Insecurity: No Food Insecurity (06/27/2021)   Hunger Vital Sign    Worried About Running Out of Food in the Last Year: Never true    Ran Out of Food in the Last Year: Never true  Transportation Needs: No Transportation Needs (06/27/2021)   PRAPARE - THydrologist(Medical): No    Lack of Transportation (Non-Medical): No  Physical Activity: Inactive (06/27/2021)   Exercise Vital Sign    Days of Exercise per Week: 0 days    Minutes of Exercise per Session: 0 min  Stress: No Stress Concern Present (06/27/2021)   [place]   Feeling of Stress : Not at all  Social Connections: Moderately Integrated (06/27/2021)   Social Connection and Isolation Panel [NHANES]    Frequency of Communication with Friends and Family: More than three times a week    Frequency of Social Gatherings with Friends and Family: Once a week    Attends Religious Services: More than 4 times per year    Active Member of [employer] Organizations: Yes    Attends CEnglish as a second language teacherMeetings: More than 4 times per year    Marital Status: Widowed  Intimate Partner Violence: Not At Risk (06/27/2021)   Humiliation, Afraid, Rape, and Kick questionnaire    Fear of Current or Ex-Partner: No    Emotionally Abused: No    Physically Abused: No    Sexually Abused: No    Outpatient Medications Prior to Visit  Medication Sig Dispense Refill   celecoxib (CELEBREX) 100 MG capsule Take 1 capsule (100 mg total) by  mouth 2 (two) times daily as needed for mild pain or moderate pain. 60 capsule 2   Cholecalciferol (VITAMIN D-3) 125 MCG (5000 UT) TABS Take 2 tablets by mouth daily.     losartan (COZAAR) 100 MG tablet Take 1 tablet (100 mg total) by mouth daily. 90 tablet 3   thyroid (NP THYROID) 90 MG tablet Take 1 tablet (90 mg total) by mouth daily. 30 tablet 5   No facility-administered medications prior to visit.    Allergies  Allergen Reactions   Lisinopril Swelling    Angioedema Angioedema Angioedema   Atorvastatin Other (See Comments)    Muscle aches   Muscle aches  Muscle aches     ROS Review of Systems  Constitutional:  Negative for chills and fever.  HENT:  Negative for congestion, sinus pressure, sinus pain and sore throat.   Eyes:  Negative for pain and discharge.  Respiratory:  Negative for cough and shortness of breath.   Cardiovascular:  Negative for chest pain and palpitations.  Gastrointestinal:  Negative for abdominal pain, constipation, diarrhea, nausea and vomiting.  Endocrine: Negative for polydipsia and polyuria.  Genitourinary:  Negative for dysuria and hematuria.  Musculoskeletal:  Positive for arthralgias. Negative for neck pain and neck stiffness.  Skin:  Negative for rash.  Neurological:  Negative for dizziness and weakness.  Psychiatric/Behavioral:  Negative for agitation and behavioral problems.       Objective:    Physical Exam Vitals reviewed.  Constitutional:      General: She is not in acute distress.     Appearance: She is obese. She is not diaphoretic.  HENT:     Head: Normocephalic and atraumatic.     Nose: Nose normal.     Mouth/Throat:     Mouth: Mucous membranes are moist.  Eyes:     General: No scleral icterus.    Extraocular Movements: Extraocular movements intact.  Cardiovascular:     Rate and Rhythm: Normal rate and regular rhythm.     Pulses: Normal pulses.     Heart sounds: Normal heart sounds. No murmur heard. Pulmonary:     Breath sounds: Normal breath sounds. No wheezing or rales.  Musculoskeletal:     Cervical back: Neck supple. No tenderness.     Right lower leg: No edema.     Left lower leg: No edema.  Skin:    General: Skin is warm.     Findings: No rash.  Neurological:     General: No focal deficit present.     Mental Status: She is alert and oriented to person, place, and time.     Sensory: No sensory deficit.     Motor: No weakness.  Psychiatric:        Mood and Affect: Mood normal.        Behavior: Behavior normal.     BP (!) 158/84 (BP Location: Right Arm, Cuff Size: Normal)   Pulse 64   Resp 18   Ht _0  (1.6 m)   Wt 184 lb 9.6 oz (83.7 kg)   SpO2 98%   BMI 32.70 kg/m  Wt Readings from Last 3 Encounters:  03/05/22 184 lb 9.6 oz (83.7 kg)  01/15/22 182 lb (82.6 kg)  01/08/22 182 lb 9.6 oz (82.8 kg)    Lab Results  Component Value Date   TSH 4.900 (H) 01/08/2022   Lab Results  Component Value Date   WBC 8.7 04/24/2021   HGB 13.1 04/24/2021   HCT 40.7 04/24/2021  MCV 90 04/24/2021   PLT 283 04/24/2021   Lab Results  Component Value Date   NA 143 01/08/2022   K 4.5 01/08/2022   CO2 23 01/08/2022   GLUCOSE 100 (H) 01/08/2022   BUN 12 01/08/2022   CREATININE 0.94 01/08/2022   BILITOT 0.4 01/08/2022   ALKPHOS 96 01/08/2022   AST 28 01/08/2022   ALT 28 01/08/2022   PROT 6.7 01/08/2022   ALBUMIN 4.0 01/08/2022   CALCIUM 9.1 01/08/2022   ANIONGAP 12 07/22/2019   EGFR 64 01/08/2022   GFR 74.79 07/15/2013   Lab Results   Component Value Date   CHOL 258 (H) 01/08/2022   Lab Results  Component Value Date   HDL 62 01/08/2022   Lab Results  Component Value Date   LDLCALC 172 (H) 01/08/2022   Lab Results  Component Value Date   TRIG 136 01/08/2022   Lab Results  Component Value Date   CHOLHDL 4.2 01/08/2022   Lab Results  Component Value Date   HGBA1C 6.2 (H) 01/08/2022      Assessment & Plan:   Problem List Items Addressed This Visit       Cardiovascular and Mediastinum   Hypertension - Primary    BP Readings from Last 1 Encounters:  03/05/22 (!) 158/84  Uncontrolled with Losartan 100 mg QD Restarted Amlodipine 10 mg QD, she was on it in the past Counseled for compliance with the medications Advised DASH diet and moderate exercise/walking, at least 150 mins/week  Needs a new BP device - Rx given      Relevant Medications   amLODipine (NORVASC) 10 MG tablet   Misc. Devices MISC   Other Relevant Orders   CMP14+EGFR     Endocrine   Hypothyroidism    Lab Results  Component Value Date   TSH 4.900 (H) 01/08/2022  On NP thyroid 90 mg QD, had decreased dose in the past due to oversupplementation Discussed about switching to Levothyroxine, she would prefer to stay on NP thyroid for now Check TSH and free T4      Relevant Orders   CMP14+EGFR   TSH + free T4     Musculoskeletal and Integument   Primary osteoarthritis of left knee    On Celecoxib now Could not continue Diclofenac due to cost concern        Other   HLD (hyperlipidemia)    Last lipid profile reviewed Needs to follow low cholesterol diet Will check lipid profile later      Relevant Medications   amLODipine (NORVASC) 10 MG tablet   Other Visit Diagnoses     Need for immunization against influenza       Relevant Orders   Flu Vaccine QUAD High Dose(Fluad) (Completed)       Meds ordered this encounter  Medications   amLODipine (NORVASC) 10 MG tablet    Sig: Take 1 tablet (10 mg total) by mouth  daily.    Dispense:  90 tablet    Refill:  1   Misc. Devices MISC    Sig: Blood pressure cuff - 1 each. ICD10: I10.    Dispense:  1 each    Refill:  0    Follow-up: Return in about 3 months (around 06/04/2022) for HTN and hypothyroidism.    Lindell Spar, MD

## 2022-03-05 NOTE — Assessment & Plan Note (Signed)
Last lipid profile reviewed Needs to follow low cholesterol diet Will check lipid profile later 

## 2022-03-05 NOTE — Assessment & Plan Note (Signed)
Lab Results  Component Value Date   TSH 4.900 (H) 01/08/2022   On NP thyroid 90 mg QD, had decreased dose in the past due to oversupplementation Discussed about switching to Levothyroxine, she would prefer to stay on NP thyroid for now Check TSH and free T4

## 2022-03-05 NOTE — Patient Instructions (Signed)
Please start taking Amlodipine as prescribed in addition to Losartan.  Please continue taking other medications as prescribed.

## 2022-03-05 NOTE — Assessment & Plan Note (Signed)
On Celecoxib now Could not continue Diclofenac due to cost concern

## 2022-03-06 LAB — CMP14+EGFR
ALT: 24 IU/L (ref 0–32)
AST: 29 IU/L (ref 0–40)
Albumin/Globulin Ratio: 1.5 (ref 1.2–2.2)
Albumin: 4 g/dL (ref 3.8–4.8)
Alkaline Phosphatase: 89 IU/L (ref 44–121)
BUN/Creatinine Ratio: 16 (ref 12–28)
BUN: 15 mg/dL (ref 8–27)
Bilirubin Total: 0.4 mg/dL (ref 0.0–1.2)
CO2: 23 mmol/L (ref 20–29)
Calcium: 9.3 mg/dL (ref 8.7–10.3)
Chloride: 106 mmol/L (ref 96–106)
Creatinine, Ser: 0.93 mg/dL (ref 0.57–1.00)
Globulin, Total: 2.7 g/dL (ref 1.5–4.5)
Glucose: 101 mg/dL — ABNORMAL HIGH (ref 70–99)
Potassium: 4.4 mmol/L (ref 3.5–5.2)
Sodium: 144 mmol/L (ref 134–144)
Total Protein: 6.7 g/dL (ref 6.0–8.5)
eGFR: 64 mL/min/{1.73_m2} (ref >59)

## 2022-03-06 LAB — TSH+FREE T4
Free T4: 0.8 ng/dL — ABNORMAL LOW (ref 0.82–1.77)
TSH: 1.37 u[IU]/mL (ref 0.450–4.500)

## 2022-03-07 ENCOUNTER — Encounter: Payer: Self-pay | Admitting: Obstetrics & Gynecology

## 2022-03-07 ENCOUNTER — Ambulatory Visit: Payer: PPO | Admitting: Internal Medicine

## 2022-03-07 ENCOUNTER — Ambulatory Visit: Payer: PPO | Admitting: Obstetrics & Gynecology

## 2022-03-07 VITALS — BP 164/90 | HR 92 | Ht 63.0 in | Wt 184.0 lb

## 2022-03-07 DIAGNOSIS — I10 Essential (primary) hypertension: Secondary | ICD-10-CM | POA: Diagnosis not present

## 2022-03-07 DIAGNOSIS — L723 Sebaceous cyst: Secondary | ICD-10-CM

## 2022-03-07 NOTE — Progress Notes (Signed)
   GYN VISIT Patient name: Adrienne [name] MRN [id number]  Date of birth: 02-02-1946 Chief Complaint:   Gynecologic Exam (Has a bump in vaginal area)  History of Present Illness:   Adrienne [name] is a 76 y.o. G4P0013 PM, [place] female being seen today for vaginal bump  Vaginal bump: Noticed the bump about the 3 weeks ago, stayed the same size.  No itching or irritation.  Not painful.  No bleeding.  No LMP recorded. Patient has had a hysterectomy.     03/05/2022    8:50 AM 01/08/2022   11:22 AM 10/23/2021    3:51 PM 08/30/2021    9:07 AM 06/27/2021   11:26 AM  Depression screen PHQ 2/9  Decreased Interest 0 0 0 0 0  Down, Depressed, Hopeless 0 0 0 0 0  PHQ - 2 Score 0 0 0 0 0     Review of Systems:   Pertinent items are noted in HPI Denies fever/chills, dizziness, headaches, visual disturbances, fatigue, shortness of breath, chest pain, abdominal pain, vomiting. Pertinent History Reviewed:  Reviewed past medical,surgical, social, obstetrical and family history.  Reviewed problem list, medications and allergies. Physical Assessment:   Vitals:   03/07/22 1011  BP: (!) 164/90  Pulse: 92  Weight: 184 lb (83.5 kg)  Height: '5\' 3"'$  (1.6 m)  Body mass index is 32.59 kg/m.       Physical Examination:   General appearance: alert, well appearing, and in no distress  Psych: mood appropriate, normal affect  Skin: warm & dry   Cardiovascular: normal heart rate noted  Respiratory: normal respiratory effort, no distress  Abdomen: soft, non-tender   Pelvic: VULVA: normal appearing vulva with no masses, upon further palpation, a small ~19m non-tender sebaceous cyst noted left labia majora- no other abnormalities noted, VAGINA: normal appearing vagina with normal color and discharge, no lesions  Extremities: no edema, no calf tenderness bilaterally  Chaperone:  pt declined     Assessment & Plan:  1) Sebaceous cyst -reassured pt of benign findings -should she note increasing size,  itch/irritaiton or other acute complaints RTC   Return if symptoms worsen or fail to improve.   JJanyth Pupa DO Attending OS[facility]De[facility] [employer] [facility] [place]

## 2022-03-21 DIAGNOSIS — Z0289 Encounter for other administrative examinations: Secondary | ICD-10-CM | POA: Diagnosis not present

## 2022-03-28 DIAGNOSIS — H903 Sensorineural hearing loss, bilateral: Secondary | ICD-10-CM | POA: Diagnosis not present

## 2022-03-28 DIAGNOSIS — H9313 Tinnitus, bilateral: Secondary | ICD-10-CM | POA: Diagnosis not present

## 2022-03-28 DIAGNOSIS — H6123 Impacted cerumen, bilateral: Secondary | ICD-10-CM | POA: Diagnosis not present

## 2022-04-04 DIAGNOSIS — C50911 Malignant neoplasm of unspecified site of right female breast: Secondary | ICD-10-CM | POA: Diagnosis not present

## 2022-04-11 DIAGNOSIS — H0264 Xanthelasma of left upper eyelid: Secondary | ICD-10-CM | POA: Diagnosis not present

## 2022-04-11 DIAGNOSIS — H02423 Myogenic ptosis of bilateral eyelids: Secondary | ICD-10-CM | POA: Diagnosis not present

## 2022-04-11 DIAGNOSIS — H0262 Xanthelasma of right lower eyelid: Secondary | ICD-10-CM | POA: Diagnosis not present

## 2022-04-11 DIAGNOSIS — H0261 Xanthelasma of right upper eyelid: Secondary | ICD-10-CM | POA: Diagnosis not present

## 2022-04-11 DIAGNOSIS — H57813 Brow ptosis, bilateral: Secondary | ICD-10-CM | POA: Diagnosis not present

## 2022-04-11 DIAGNOSIS — H02421 Myogenic ptosis of right eyelid: Secondary | ICD-10-CM | POA: Diagnosis not present

## 2022-04-11 DIAGNOSIS — H02422 Myogenic ptosis of left eyelid: Secondary | ICD-10-CM | POA: Diagnosis not present

## 2022-04-11 DIAGNOSIS — H02834 Dermatochalasis of left upper eyelid: Secondary | ICD-10-CM | POA: Diagnosis not present

## 2022-04-11 DIAGNOSIS — H02831 Dermatochalasis of right upper eyelid: Secondary | ICD-10-CM | POA: Diagnosis not present

## 2022-04-11 DIAGNOSIS — D485 Neoplasm of uncertain behavior of skin: Secondary | ICD-10-CM | POA: Diagnosis not present

## 2022-04-11 DIAGNOSIS — H0279 Other degenerative disorders of eyelid and periocular area: Secondary | ICD-10-CM | POA: Diagnosis not present

## 2022-04-11 DIAGNOSIS — H02413 Mechanical ptosis of bilateral eyelids: Secondary | ICD-10-CM | POA: Diagnosis not present

## 2022-04-15 ENCOUNTER — Telehealth: Payer: Self-pay

## 2022-04-15 NOTE — Telephone Encounter (Signed)
Patient called said she was dizzy, did not want to schedule a appointment, wanted Dr Posey Pronto nurse to return her call.  I explain may need an appointment last seen 02/2022. She said just have nurse call me at [phone].

## 2022-04-15 NOTE — Telephone Encounter (Signed)
Pt made an appt  °

## 2022-04-16 ENCOUNTER — Encounter: Payer: Self-pay | Admitting: Internal Medicine

## 2022-04-16 ENCOUNTER — Ambulatory Visit (INDEPENDENT_AMBULATORY_CARE_PROVIDER_SITE_OTHER): Payer: PPO | Admitting: Internal Medicine

## 2022-04-16 VITALS — BP 146/90 | HR 59 | Resp 16 | Ht 63.0 in | Wt 184.4 lb

## 2022-04-16 DIAGNOSIS — R42 Dizziness and giddiness: Secondary | ICD-10-CM

## 2022-04-16 DIAGNOSIS — I10 Essential (primary) hypertension: Secondary | ICD-10-CM | POA: Diagnosis not present

## 2022-04-16 MED ORDER — MECLIZINE HCL 25 MG PO TABS: 25.0000 mg | ORAL_TABLET | Freq: Three times a day (TID) | ORAL | 0 refills | Status: DC | PRN

## 2022-04-16 NOTE — Assessment & Plan Note (Signed)
Orthostatics negative BP elevated as she has not had her meds today Dizziness likely due to vertigo Meclizine as needed Advised to maintain adequate hydration and avoid skipping any meals Avoid sudden positional changes Vestibular exercises advised

## 2022-04-16 NOTE — Assessment & Plan Note (Signed)
BP Readings from Last 1 Encounters:  04/16/22 (!) 146/90   Usually well-controlled with Losartan 100 mg QD and amlodipine 10 mg daily Elevated today as she has not had her meds yet Counseled for compliance with the medications Advised DASH diet and moderate exercise/walking, at least 150 mins/week

## 2022-04-16 NOTE — Patient Instructions (Signed)
Please take Meclizine as needed for dizziness.  Please continue to take at least 64 ounces of fluid in a day.  Please avoid skipping any meals.  Avoid sudden positional changes.

## 2022-04-16 NOTE — Progress Notes (Signed)
Acute Office Visit  Subjective:    Patient ID: Adrienne [name], female    DOB: 04/07/1946, 76 y.o.   MRN: [id number]  Chief Complaint  Patient presents with   Dizziness    Pt has been having dizzy spells on and off since 03-26-22 this comes on randomly sitting standing and laying doesn't have an effect     HPI Patient is in today for c/o dizziness for the last 2 weeks.  She reports dizzy spells in the sitting, standing and lying down positions.  She denies any headache, or any new focal neurologic deficits such as numbness or tingling of the UE or LE.  Denies any recent fall.  She has mild nausea with dizziness.  Denies any ear pain or discharge currently.  Her BP was elevated today, but she has not taken her amlodipine and losartan today.  She denies any chest pain, dyspnea or palpitations.  Past Medical History:  Diagnosis Date   Allergy    Ambulates with cane    straight cane   Arthritis    knee, back   CHF (congestive heart failure) ([place])    Ductal carcinoma in situ of breast 12/2018   R Breast-mastectomy only   Gout    History of blood transfusion 1986   w/ Hysterectomy surgery   Hyperlipidemia    diet controlled - no meds   Hypertension    Hypothyroidism    Pelvic kidney    lower right pelvic kidney    Prediabetes    diet controlled - no meds   SBO (small bowel obstruction) ([place]) 10/2016   surgery    Sleep apnea    Mild - no mask needed per sleep study   Smoker    quit smoking 2018    Past Surgical History:  Procedure Laterality Date   [place]     pt states it was removed when gallbladder was removed.   BACK SURGERY     lower back   BREAST BIOPSY Right 05/12/2019   times 2   BREAST LUMPECTOMY WITH RADIOACTIVE SEED LOCALIZATION Right 03/18/2019   Procedure: RIGHT BREAST LUMPECTOMY WITH RADIOACTIVE SEED LOCALIZATION;  Surgeon: Jovita Kussmaul, MD;  Location: [facility];  Service: General;   Laterality: Right;   CHOLECYSTECTOMY     COLONOSCOPY  02/15/2008   [facility]    COLONOSCOPY WITH PROPOFOL  02/27/2018   Dr.Nandigam   ECTOPIC PREGNANCY SURGERY     JOINT REPLACEMENT     Left hip total Dr. [name] 10-01-17   MASTECTOMY Right 07/26/2019   POLYPECTOMY     polypectomy-oropharynx     RE-EXCISION OF BREAST CANCER,SUPERIOR MARGINS Right 04/08/2019   Procedure: RE-EXCISION OF RIGHT BREAST ANTERIOR MARGINS;  Surgeon: [name], MD;  [facility];  Service: General;  Laterality: Right;   right knee meniscus     SBO  10/2016   small bowel obstruction   SIMPLE MASTECTOMY WITH AXILLARY SENTINEL NODE BIOPSY Right 07/26/2019   Procedure: RIGHT MASTECTOMY WITH SENTINEL NODE BIOPSY;  Surgeon: Jovita Kussmaul, MD;  Location: [facility];  Service: General;  Laterality: Right;   TOTAL HIP ARTHROPLASTY Left 10/01/2017   Procedure: LEFT  TOTAL HIP ARTHROPLASTY ANTERIOR APPROACH;  Surgeon: [name], MD;  [facility];  Service: Orthopedics;  Laterality: Left;   TOTAL HIP ARTHROPLASTY Right 03/11/2018   Procedure: RIGHT TOTAL HIP ARTHROPLASTY ANTERIOR APPROACH;  Surgeon: [name], MD;  Location: [facility];  Service: Orthopedics;  Laterality: Right;  183mn   UPPER GASTROINTESTINAL ENDOSCOPY      Family History  Problem Relation Age of Onset   Cancer Sister    Diabetes Brother    Diabetes Brother    Hypertension Other    Breast cancer Sister    Colon cancer Neg Hx    Colon polyps Neg Hx    Rectal cancer Neg Hx    Stomach cancer Neg Hx     Social History   Socioeconomic History   Marital status: Widowed    Spouse name: Not on file   Number of children: 3   Years of education: 142  Highest education level: Some college, no degree  Occupational History   Occupation: retired  Tobacco Use   Smoking status: Former    Packs/day: 1.00    Years: 32.00    Total pack years: 32.00    Types: E-cigarettes, Cigarettes    Quit date: 11/20/2016    Years  since quitting: 5.4   Smokeless tobacco: Never   Tobacco comments:    Smoker since 1975 has quit and started back several times!!,  Vaping Use   Vaping Use: Former  Substance and Sexual Activity   Alcohol use: No   Drug use: No   Sexual activity: Not Currently    Birth control/protection: Surgical    Comment: Hysterectomy  Other Topics Concern   Not on file  Social History Narrative   Widow since 2006.Married previously for 25 years.Lives with grandson and 2 great grandchildren.Retired.Previously office work.   Social Determinants of Health   Financial Resource Strain: Low Risk  (06/27/2021)   Overall Financial Resource Strain (CARDIA)    Difficulty of Paying Living Expenses: Not hard at all  Food Insecurity: No Food Insecurity (06/27/2021)   Hunger Vital Sign    Worried About Running Out of Food in the Last Year: Never true    Ran Out of Food in the Last Year: Never true  Transportation Needs: No Transportation Needs (06/27/2021)   PRAPARE - THydrologist(Medical): No    Lack of Transportation (Non-Medical): No  Physical Activity: Inactive (06/27/2021)   Exercise Vital Sign    Days of Exercise per Week: 0 days    Minutes of Exercise per Session: 0 min  Stress: No Stress Concern Present (06/27/2021)   [place]   Feeling of Stress : Not at all  Social Connections: Moderately Integrated (06/27/2021)   Social Connection and Isolation Panel [NHANES]    Frequency of Communication with Friends and Family: More than three times a week    Frequency of Social Gatherings with Friends and Family: Once a week    Attends Religious Services: More than 4 times per year    Active Member of [employer] Organizations: Yes    Attends CArchivistMeetings: More than 4 times per year    Marital Status: Widowed  Intimate Partner Violence: Not At Risk (06/27/2021)   Humiliation, Afraid, Rape, and Kick  questionnaire    Fear of Current or Ex-Partner: No    Emotionally Abused: No    Physically Abused: No    Sexually Abused: No    Outpatient Medications Prior to Visit  Medication Sig Dispense Refill   amLODipine (NORVASC) 10 MG tablet Take 1 tablet (10 mg total) by mouth daily. 90 tablet 1   celecoxib (CELEBREX) 100 MG capsule Take 1 capsule (  100 mg total) by mouth 2 (two) times daily as needed for mild pain or moderate pain. 60 capsule 2   Cholecalciferol (VITAMIN D-3) 125 MCG (5000 UT) TABS Take 2 tablets by mouth daily.     losartan (COZAAR) 100 MG tablet Take 1 tablet (100 mg total) by mouth daily. 90 tablet 3   Misc. Devices MISC Blood pressure cuff - 1 each. ICD10: I10. 1 each 0   thyroid (NP THYROID) 90 MG tablet Take 1 tablet (90 mg total) by mouth daily. 30 tablet 5   No facility-administered medications prior to visit.    Allergies  Allergen Reactions   Lisinopril Swelling    Angioedema Angioedema Angioedema   Atorvastatin Other (See Comments)    Muscle aches   Muscle aches  Muscle aches     Review of Systems  Constitutional:  Negative for chills and fever.  HENT:  Negative for congestion, sinus pressure, sinus pain and sore throat.   Eyes:  Negative for pain and discharge.  Respiratory:  Negative for cough and shortness of breath.   Cardiovascular:  Negative for chest pain and palpitations.  Gastrointestinal:  Negative for abdominal pain, constipation, diarrhea, nausea and vomiting.  Endocrine: Negative for polydipsia and polyuria.  Genitourinary:  Negative for dysuria and hematuria.  Musculoskeletal:  Positive for arthralgias. Negative for neck pain and neck stiffness.  Skin:  Negative for rash.  Neurological:  Positive for dizziness. Negative for weakness.  Psychiatric/Behavioral:  Negative for agitation and behavioral problems.        Objective:    Physical Exam Vitals reviewed.  Constitutional:      General: She is not in acute distress.     Appearance: She is obese. She is not diaphoretic.  HENT:     Head: Normocephalic and atraumatic.     Nose: Nose normal.     Mouth/Throat:     Mouth: Mucous membranes are moist.  Eyes:     General: No scleral icterus.    Extraocular Movements: Extraocular movements intact.  Cardiovascular:     Rate and Rhythm: Normal rate and regular rhythm.     Pulses: Normal pulses.     Heart sounds: Normal heart sounds. No murmur heard. Pulmonary:     Breath sounds: Normal breath sounds. No wheezing or rales.  Musculoskeletal:     Cervical back: Neck supple. No tenderness.     Right lower leg: No edema.     Left lower leg: No edema.  Skin:    General: Skin is warm.     Findings: No rash.  Neurological:     General: No focal deficit present.     Mental Status: She is alert and oriented to person, place, and time.     Sensory: No sensory deficit.     Motor: No weakness.  Psychiatric:        Mood and Affect: Mood normal.        Behavior: Behavior normal.     BP (!) 146/90 (BP Location: Right Arm, Cuff Size: Normal)   Pulse (!) 59   Resp 16   Ht '5\' 3"'$  (1.6 m)   Wt 184 lb 6.4 oz (83.6 kg)   SpO2 100%   BMI 32.66 kg/m  Wt Readings from Last 3 Encounters:  04/16/22 184 lb 6.4 oz (83.6 kg)  03/07/22 184 lb (83.5 kg)  03/05/22 184 lb 9.6 oz (83.7 kg)        Assessment & Plan:   Problem List Items Addressed This  Visit       Cardiovascular and Mediastinum   Hypertension    BP Readings from Last 1 Encounters:  04/16/22 (!) 146/90  Usually well-controlled with Losartan 100 mg QD and amlodipine 10 mg daily Elevated today as she has not had her meds yet Counseled for compliance with the medications Advised DASH diet and moderate exercise/walking, at least 150 mins/week        Other   Dizziness - Primary    Orthostatics negative BP elevated as she has not had her meds today Dizziness likely due to vertigo Meclizine as needed Advised to maintain adequate hydration and avoid  skipping any meals Avoid sudden positional changes Vestibular exercises advised      Relevant Medications   meclizine (ANTIVERT) 25 MG tablet     Meds ordered this encounter  Medications   meclizine (ANTIVERT) 25 MG tablet    Sig: Take 1 tablet (25 mg total) by mouth 3 (three) times daily as needed for dizziness.    Dispense:  30 tablet    Refill:  0     Jak Haggar Keith Rake, MD

## 2022-04-17 ENCOUNTER — Ambulatory Visit: Payer: PPO | Admitting: Internal Medicine

## 2022-04-18 DIAGNOSIS — H2513 Age-related nuclear cataract, bilateral: Secondary | ICD-10-CM | POA: Diagnosis not present

## 2022-05-07 ENCOUNTER — Ambulatory Visit: Payer: PPO | Admitting: Internal Medicine

## 2022-05-21 ENCOUNTER — Ambulatory Visit: Payer: PPO | Admitting: Internal Medicine

## 2022-05-21 DIAGNOSIS — I5022 Chronic systolic (congestive) heart failure: Secondary | ICD-10-CM | POA: Diagnosis not present

## 2022-05-21 DIAGNOSIS — R7303 Prediabetes: Secondary | ICD-10-CM | POA: Diagnosis not present

## 2022-05-21 DIAGNOSIS — I739 Peripheral vascular disease, unspecified: Secondary | ICD-10-CM | POA: Diagnosis not present

## 2022-05-21 DIAGNOSIS — Z66 Do not resuscitate: Secondary | ICD-10-CM | POA: Diagnosis not present

## 2022-06-05 ENCOUNTER — Ambulatory Visit: Payer: PPO | Admitting: Internal Medicine

## 2022-06-06 ENCOUNTER — Ambulatory Visit (INDEPENDENT_AMBULATORY_CARE_PROVIDER_SITE_OTHER): Payer: PPO | Admitting: Internal Medicine

## 2022-06-06 ENCOUNTER — Encounter: Payer: Self-pay | Admitting: Internal Medicine

## 2022-06-06 VITALS — BP 136/84 | HR 48 | Ht 63.0 in | Wt 192.6 lb

## 2022-06-06 DIAGNOSIS — E782 Mixed hyperlipidemia: Secondary | ICD-10-CM

## 2022-06-06 DIAGNOSIS — R7303 Prediabetes: Secondary | ICD-10-CM | POA: Diagnosis not present

## 2022-06-06 DIAGNOSIS — E039 Hypothyroidism, unspecified: Secondary | ICD-10-CM | POA: Diagnosis not present

## 2022-06-06 DIAGNOSIS — R42 Dizziness and giddiness: Secondary | ICD-10-CM | POA: Diagnosis not present

## 2022-06-06 DIAGNOSIS — Z122 Encounter for screening for malignant neoplasm of respiratory organs: Secondary | ICD-10-CM | POA: Diagnosis not present

## 2022-06-06 DIAGNOSIS — I10 Essential (primary) hypertension: Secondary | ICD-10-CM

## 2022-06-06 MED ORDER — MECLIZINE HCL 25 MG PO TABS: 25.0000 mg | ORAL_TABLET | Freq: Three times a day (TID) | ORAL | 0 refills | Status: DC | PRN

## 2022-06-06 NOTE — Progress Notes (Signed)
Established Patient Office Visit  Subjective:  Patient ID: Adrienne [name], female    DOB: 06-04-1946  Age: 76 y.o. MRN: [id number]  CC:  Chief Complaint  Patient presents with   Follow-up    Three month follow up for hypertension. Patient states her three siblings have dementia or Alzheimer, she would like to discuss precautions      HPI Adrienne [name] is a 76 y.o. female with past medical history of HTN, nonishcemic CM, hypothyroidism, OA of hip and knee, HLD, breast ca. s/p right mastectomy who presents for f/u of her chronic medical conditions.  HTN: BP is elevated today, but she has not had her medications yet. Takes Losartan and Amlodipine regularly. Coreg was discontinued due to bradycardia. Patient denies headache, dizziness, chest pain, dyspnea or palpitations.  Hypothyroidism: She takes NP thyroid 90 mg daily currently.  She has history of oversupplemented thyroid in the past.  She denies any recent change in appetite, constipation, tremors or palpitations. She has noticed weight gain recently, about 8 lbs since 09/23.    Past Medical History:  Diagnosis Date   Allergy    Ambulates with cane    straight cane   Arthritis    knee, back   CHF (congestive heart failure) ([place])    Ductal carcinoma in situ of breast 12/2018   R Breast-mastectomy only   Gout    History of blood transfusion 1986   w/ Hysterectomy surgery   Hyperlipidemia    diet controlled - no meds   Hypertension    Hypothyroidism    Pelvic kidney    lower right pelvic kidney    Prediabetes    diet controlled - no meds   SBO (small bowel obstruction) ([place]) 10/2016   surgery    Sleep apnea    Mild - no mask needed per sleep study   Smoker    quit smoking 2018    Past Surgical History:  Procedure Laterality Date   [place]     pt states it was removed when gallbladder was removed.   BACK SURGERY     lower back   BREAST  BIOPSY Right 05/12/2019   times 2   BREAST LUMPECTOMY WITH RADIOACTIVE SEED LOCALIZATION Right 03/18/2019   Procedure: RIGHT BREAST LUMPECTOMY WITH RADIOACTIVE SEED LOCALIZATION;  Surgeon: Jovita Kussmaul, MD;  Location: [facility];  Service: General;  Laterality: Right;   CHOLECYSTECTOMY     COLONOSCOPY  02/15/2008   [facility]    COLONOSCOPY WITH PROPOFOL  02/27/2018   Dr.Nandigam   ECTOPIC PREGNANCY SURGERY     JOINT REPLACEMENT     Left hip total Dr. [name] 10-01-17   MASTECTOMY Right 07/26/2019   POLYPECTOMY     polypectomy-oropharynx     RE-EXCISION OF BREAST CANCER,SUPERIOR MARGINS Right 04/08/2019   Procedure: RE-EXCISION OF RIGHT BREAST ANTERIOR MARGINS;  Surgeon: [name], MD;  [facility];  Service: General;  Laterality: Right;   right knee meniscus     SBO  10/2016   small bowel obstruction   SIMPLE MASTECTOMY WITH AXILLARY SENTINEL NODE BIOPSY Right 07/26/2019   Procedure: RIGHT MASTECTOMY WITH SENTINEL NODE BIOPSY;  Surgeon: Jovita Kussmaul, MD;  Location: [facility];  Service: General;  Laterality: Right;   TOTAL HIP ARTHROPLASTY Left 10/01/2017   Procedure: LEFT  TOTAL HIP ARTHROPLASTY ANTERIOR APPROACH;  Surgeon: [name], MD;  [facility];  Service: Orthopedics;  Laterality:  Left;   TOTAL HIP ARTHROPLASTY Right 03/11/2018   Procedure: RIGHT TOTAL HIP ARTHROPLASTY ANTERIOR APPROACH;  Surgeon: [name], MD;  [facility];  Service: Orthopedics;  Laterality: Right;  12mn   UPPER GASTROINTESTINAL ENDOSCOPY      Family History  Problem Relation Age of Onset   Cancer Sister    Diabetes Brother    Diabetes Brother    Hypertension Other    Breast cancer Sister    Colon cancer Neg Hx    Colon polyps Neg Hx    Rectal cancer Neg Hx    Stomach cancer Neg Hx     Social History   Socioeconomic History   Marital status: Widowed    Spouse name: Not on file   Number of children: 3   Years of education: 148  Highest education  level: Some college, no degree  Occupational History   Occupation: retired  Tobacco Use   Smoking status: Former    Packs/day: 1.00    Years: 32.00    Total pack years: 32.00    Types: E-cigarettes, Cigarettes    Quit date: 11/20/2016    Years since quitting: 5.5   Smokeless tobacco: Never   Tobacco comments:    Smoker since 1975 has quit and started back several times!!,  Vaping Use   Vaping Use: Former  Substance and Sexual Activity   Alcohol use: No   Drug use: No   Sexual activity: Not Currently    Birth control/protection: Surgical    Comment: Hysterectomy  Other Topics Concern   Not on file  Social History Narrative   Widow since 2006.Married previously for 25 years.Lives with grandson and 2 great grandchildren.Retired.Previously office work.   Social Determinants of Health   Financial Resource Strain: Low Risk  (06/27/2021)   Overall Financial Resource Strain (CARDIA)    Difficulty of Paying Living Expenses: Not hard at all  Food Insecurity: No Food Insecurity (06/27/2021)   Hunger Vital Sign    Worried About Running Out of Food in the Last Year: Never true    Ran Out of Food in the Last Year: Never true  Transportation Needs: No Transportation Needs (06/27/2021)   PRAPARE - THydrologist(Medical): No    Lack of Transportation (Non-Medical): No  Physical Activity: Inactive (06/27/2021)   Exercise Vital Sign    Days of Exercise per Week: 0 days    Minutes of Exercise per Session: 0 min  Stress: No Stress Concern Present (06/27/2021)   [place]   Feeling of Stress : Not at all  Social Connections: Moderately Integrated (06/27/2021)   Social Connection and Isolation Panel [NHANES]    Frequency of Communication with Friends and Family: More than three times a week    Frequency of Social Gatherings with Friends and Family: Once a week    Attends Religious Services: More than 4  times per year    Active Member of [employer] Organ[employer]: Yes    Attends CArchivistMeetings: More than 4 times per year    Marital Status: Widowed  Intimate Partner Violence: Not At Risk (06/27/2021)   Humiliation, Afraid, Rape, and Kick questionnaire    Fear of Current or Ex-Partner: No    Emotionally Abused: No    Physically Abused: No    Sexually Abused: No    Outpatient Medications Prior to Visit  Medication Sig Dispense Refill   amLODipine ([place]  10 MG tablet Take 1 tablet (10 mg total) by mouth daily. 90 tablet 1   celecoxib (CELEBREX) 100 MG capsule Take 1 capsule (100 mg total) by mouth 2 (two) times daily as needed for mild pain or moderate pain. 60 capsule 2   Cholecalciferol (VITAMIN D-3) 125 MCG (5000 UT) TABS Take 2 tablets by mouth daily.     losartan (COZAAR) 100 MG tablet Take 1 tablet (100 mg total) by mouth daily. 90 tablet 3   Misc. Devices MISC Blood pressure cuff - 1 each. ICD10: I10. 1 each 0   thyroid (NP THYROID) 90 MG tablet Take 1 tablet (90 mg total) by mouth daily. 30 tablet 5   meclizine (ANTIVERT) 25 MG tablet Take 1 tablet (25 mg total) by mouth 3 (three) times daily as needed for dizziness. 30 tablet 0   No facility-administered medications prior to visit.    Allergies  Allergen Reactions   Lisinopril Swelling    Angioedema Angioedema Angioedema   Atorvastatin Other (See Comments)    Muscle aches   Muscle aches  Muscle aches     ROS Review of Systems  Constitutional:  Negative for chills and fever.  HENT:  Negative for congestion, sinus pressure, sinus pain and sore throat.   Eyes:  Negative for pain and discharge.  Respiratory:  Negative for cough and shortness of breath.   Cardiovascular:  Negative for chest pain and palpitations.  Gastrointestinal:  Negative for abdominal pain, constipation, diarrhea, nausea and vomiting.  Endocrine: Negative for polydipsia and polyuria.  Genitourinary:  Negative for dysuria and hematuria.   Musculoskeletal:  Positive for arthralgias. Negative for neck pain and neck stiffness.  Skin:  Negative for rash.  Neurological:  Negative for dizziness and weakness.  Psychiatric/Behavioral:  Negative for agitation and behavioral problems.       Objective:    Physical Exam Vitals reviewed.  Constitutional:      General: She is not in acute distress.    Appearance: She is obese. She is not diaphoretic.  HENT:     Head: Normocephalic and atraumatic.     Nose: Nose normal.     Mouth/Throat:     Mouth: Mucous membranes are moist.  Eyes:     General: No scleral icterus.    Extraocular Movements: Extraocular movements intact.  Cardiovascular:     Rate and Rhythm: Normal rate and regular rhythm.     Pulses: Normal pulses.     Heart sounds: Normal heart sounds. No murmur heard. Pulmonary:     Breath sounds: Normal breath sounds. No wheezing or rales.  Musculoskeletal:     Cervical back: Neck supple. No tenderness.     Right lower leg: No edema.     Left lower leg: No edema.  Skin:    General: Skin is warm.     Findings: No rash.  Neurological:     General: No focal deficit present.     Mental Status: She is alert and oriented to person, place, and time.     Sensory: No sensory deficit.     Motor: No weakness.  Psychiatric:        Mood and Affect: Mood normal.        Behavior: Behavior normal.     BP 136/84 (BP Location: Right Arm, Cuff Size: Normal)   Pulse (!) 48   Ht _0  (1.6 m)   Wt 192 lb 9.6 oz (87.4 kg)   SpO2 95%   BMI 34.12 kg/m  Wt Readings from Last 3 Encounters:  06/06/22 192 lb 9.6 oz (87.4 kg)  04/16/22 184 lb 6.4 oz (83.6 kg)  03/07/22 184 lb (83.5 kg)    Lab Results  Component Value Date   TSH 1.370 03/05/2022   Lab Results  Component Value Date   WBC 8.7 04/24/2021   HGB 13.1 04/24/2021   HCT 40.7 04/24/2021   MCV 90 04/24/2021   PLT 283 04/24/2021   Lab Results  Component Value Date   NA 144 03/05/2022   K 4.4 03/05/2022   CO2  23 03/05/2022   GLUCOSE 101 (H) 03/05/2022   BUN 15 03/05/2022   CREATININE 0.93 03/05/2022   BILITOT 0.4 03/05/2022   ALKPHOS 89 03/05/2022   AST 29 03/05/2022   ALT 24 03/05/2022   PROT 6.7 03/05/2022   ALBUMIN 4.0 03/05/2022   CALCIUM 9.3 03/05/2022   ANIONGAP 12 07/22/2019   EGFR 64 03/05/2022   GFR 74.79 07/15/2013   Lab Results  Component Value Date   CHOL 258 (H) 01/08/2022   Lab Results  Component Value Date   HDL 62 01/08/2022   Lab Results  Component Value Date   LDLCALC 172 (H) 01/08/2022   Lab Results  Component Value Date   TRIG 136 01/08/2022   Lab Results  Component Value Date   CHOLHDL 4.2 01/08/2022   Lab Results  Component Value Date   HGBA1C 6.2 (H) 01/08/2022      Assessment & Plan:   Problem List Items Addressed This Visit       Cardiovascular and Mediastinum   Hypertension    BP Readings from Last 1 Encounters:  06/06/22 136/84  Usually well-controlled with Losartan 100 mg QD and amlodipine 10 mg daily Elevated today as she has not had her meds yet Counseled for compliance with the medications Advised DASH diet and moderate exercise/walking, at least 150 mins/week        Endocrine   Hypothyroidism - Primary    Lab Results  Component Value Date   TSH 1.370 03/05/2022  On NP thyroid 90 mg QD, had decreased dose in the past due to oversupplementation Discussed about switching to Levothyroxine, she would prefer to stay on NP thyroid for now Check TSH and free T4      Relevant Orders   TSH + free T4   Basic Metabolic Panel (BMET)     Other   HLD (hyperlipidemia)    Last lipid profile reviewed Needs to follow low cholesterol diet Will check lipid profile later      Prediabetes    Lab Results  Component Value Date   HGBA1C 6.2 (H) 01/08/2022  Advised to follow low carb diet      Dizziness    Dizziness likely due to vertigo Meclizine as needed Advised to maintain adequate hydration and avoid skipping any  meals Avoid sudden positional changes Vestibular exercises advised      Relevant Medications   meclizine (ANTIVERT) 25 MG tablet   Screening for lung cancer    Quit smoking in 2018, but has >30 pack-year smoking history Low-dose CT chest ordered for lung cancer screening      Relevant Orders   CT CHEST LUNG CANCER SCREENING LOW DOSE WO CONTRAST   Meds ordered this encounter  Medications   meclizine (ANTIVERT) 25 MG tablet    Sig: Take 1 tablet (25 mg total) by mouth 3 (three) times daily as needed for dizziness.    Dispense:  30 tablet  Refill:  0    Follow-up: Return in about 4 months (around 10/06/2022) for Annual physical.    Lindell Spar, MD

## 2022-06-06 NOTE — Assessment & Plan Note (Signed)
Quit smoking in 2018, but has >30 pack-year smoking history Low-dose CT chest ordered for lung cancer screening

## 2022-06-06 NOTE — Assessment & Plan Note (Signed)
Lab Results  Component Value Date   HGBA1C 6.2 (H) 01/08/2022   Advised to follow low carb diet

## 2022-06-06 NOTE — Assessment & Plan Note (Signed)
Dizziness likely due to vertigo Meclizine as needed Advised to maintain adequate hydration and avoid skipping any meals Avoid sudden positional changes Vestibular exercises advised

## 2022-06-06 NOTE — Assessment & Plan Note (Signed)
Last lipid profile reviewed Needs to follow low cholesterol diet Will check lipid profile later

## 2022-06-06 NOTE — Assessment & Plan Note (Signed)
BP Readings from Last 1 Encounters:  06/06/22 136/84   Usually well-controlled with Losartan 100 mg QD and amlodipine 10 mg daily Elevated today as she has not had her meds yet Counseled for compliance with the medications Advised DASH diet and moderate exercise/walking, at least 150 mins/week

## 2022-06-06 NOTE — Assessment & Plan Note (Signed)
Lab Results  Component Value Date   TSH 1.370 03/05/2022   On NP thyroid 90 mg QD, had decreased dose in the past due to oversupplementation Discussed about switching to Levothyroxine, she would prefer to stay on NP thyroid for now Check TSH and free T4

## 2022-06-06 NOTE — Patient Instructions (Signed)
Please continue taking medications as prescribed.  Please continue to follow low salt diet and ambulate as tolerated. 

## 2022-06-07 LAB — BASIC METABOLIC PANEL
BUN/Creatinine Ratio: 14 (ref 12–28)
BUN: 13 mg/dL (ref 8–27)
CO2: 22 mmol/L (ref 20–29)
Calcium: 9.3 mg/dL (ref 8.7–10.3)
Chloride: 107 mmol/L — ABNORMAL HIGH (ref 96–106)
Creatinine, Ser: 0.92 mg/dL (ref 0.57–1.00)
Glucose: 100 mg/dL — ABNORMAL HIGH (ref 70–99)
Potassium: 4.4 mmol/L (ref 3.5–5.2)
Sodium: 144 mmol/L (ref 134–144)
eGFR: 65 mL/min/{1.73_m2} (ref >59)

## 2022-06-07 LAB — TSH+FREE T4
Free T4: 0.76 ng/dL — ABNORMAL LOW (ref 0.82–1.77)
TSH: 1.35 u[IU]/mL (ref 0.450–4.500)

## 2022-07-08 ENCOUNTER — Other Ambulatory Visit: Payer: Self-pay | Admitting: Internal Medicine

## 2022-07-08 DIAGNOSIS — M1712 Unilateral primary osteoarthritis, left knee: Secondary | ICD-10-CM

## 2022-07-09 DIAGNOSIS — H2513 Age-related nuclear cataract, bilateral: Secondary | ICD-10-CM | POA: Diagnosis not present

## 2022-07-09 DIAGNOSIS — H40013 Open angle with borderline findings, low risk, bilateral: Secondary | ICD-10-CM | POA: Diagnosis not present

## 2022-07-09 DIAGNOSIS — H18413 Arcus senilis, bilateral: Secondary | ICD-10-CM | POA: Diagnosis not present

## 2022-07-09 DIAGNOSIS — H2512 Age-related nuclear cataract, left eye: Secondary | ICD-10-CM | POA: Diagnosis not present

## 2022-07-09 DIAGNOSIS — H25013 Cortical age-related cataract, bilateral: Secondary | ICD-10-CM | POA: Diagnosis not present

## 2022-07-09 DIAGNOSIS — H25043 Posterior subcapsular polar age-related cataract, bilateral: Secondary | ICD-10-CM | POA: Diagnosis not present

## 2022-07-11 ENCOUNTER — Ambulatory Visit (HOSPITAL_COMMUNITY)
Admission: RE | Admit: 2022-07-11 | Discharge: 2022-07-11 | Disposition: A | Discharge status: Home / Self Care | Payer: PPO | Source: Ambulatory Visit | Attending: Internal Medicine | Admitting: Internal Medicine

## 2022-07-11 DIAGNOSIS — I700 Atherosclerosis of aorta: Secondary | ICD-10-CM | POA: Insufficient documentation

## 2022-07-11 DIAGNOSIS — Z122 Encounter for screening for malignant neoplasm of respiratory organs: Secondary | ICD-10-CM

## 2022-07-11 DIAGNOSIS — I2510 Atherosclerotic heart disease of native coronary artery without angina pectoris: Secondary | ICD-10-CM | POA: Insufficient documentation

## 2022-07-11 DIAGNOSIS — J432 Centrilobular emphysema: Secondary | ICD-10-CM | POA: Diagnosis not present

## 2022-07-11 DIAGNOSIS — Z87891 Personal history of nicotine dependence: Secondary | ICD-10-CM | POA: Insufficient documentation

## 2022-07-12 ENCOUNTER — Other Ambulatory Visit: Payer: Self-pay

## 2022-07-12 DIAGNOSIS — J849 Interstitial pulmonary disease, unspecified: Secondary | ICD-10-CM

## 2022-07-16 ENCOUNTER — Encounter: Payer: Self-pay | Admitting: Pulmonary Disease

## 2022-07-16 ENCOUNTER — Ambulatory Visit (INDEPENDENT_AMBULATORY_CARE_PROVIDER_SITE_OTHER): Payer: PPO | Admitting: Pulmonary Disease

## 2022-07-16 VITALS — BP 130/66 | HR 48 | Temp 98.7°F | Ht 63.0 in | Wt 192.2 lb

## 2022-07-16 DIAGNOSIS — J849 Interstitial pulmonary disease, unspecified: Secondary | ICD-10-CM | POA: Diagnosis not present

## 2022-07-16 NOTE — Progress Notes (Signed)
Subjective:    Patient ID: Adrienne [name], female    DOB: [birth year], 77 y.o.   MRN: [id number]  HPI Chief Complaint  Patient presents with   Consult    Possible ILD after CT scan    77 year old ex-smoker presents for evaluation of abnormal CT scan showing ILD. She smoked 1 pack/day starting in her 35s until she quit in 2018, more than 40 pack years.  She underwent low-dose CT chest screening study by PCP that showed small nodules, mild emphysema and evidence of ILD. She denies symptoms related to this but on further questioning does admit to some shortness of breath especially when she is in a rash.  She does use stairs at home but due to her hip surgery she takes her time.  She is able to walk in a store.  She denies current chest colds or wheezing.  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 Patchy areas of ground-glass attenuation, septal thickening, thickening of the peribronchovascular interstitium and mild cylindrical bronchiectasis are noted throughout the mid to lower lungs bilaterally, concerning for interstitial lung disease  Past Medical History:  Diagnosis Date   Allergy    Ambulates with cane    straight cane   Arthritis    knee, back   CHF (congestive heart failure) ([place])    Ductal carcinoma in situ of breast 12/2018   R Breast-mastectomy only   Gout    History of blood transfusion 1986   w/ Hysterectomy surgery   Hyperlipidemia    diet controlled - no meds   Hypertension    Hypothyroidism    Pelvic kidney    lower right pelvic kidney    Prediabetes    diet  controlled - no meds   SBO (small bowel obstruction) ([place]) 10/2016   surgery    Sleep apnea    Mild - no mask needed per sleep study   Smoker    quit smoking 2018    Past Surgical History:  Procedure Laterality Date   [place]     pt states it was removed when gallbladder was removed.   BACK SURGERY     lower back   BREAST BIOPSY Right 05/12/2019   times 2   BREAST LUMPECTOMY WITH RADIOACTIVE SEED LOCALIZATION Right 03/18/2019   Procedure: RIGHT BREAST LUMPECTOMY WITH RADIOACTIVE SEED LOCALIZATION;  Surgeon: Jovita Kussmaul, MD;  Location: [facility];  Service: General;  Laterality: Right;   CHOLECYSTECTOMY     COLONOSCOPY  02/15/2008   [facility]    COLONOSCOPY WITH PROPOFOL  02/27/2018   Dr.Nandigam   ECTOPIC PREGNANCY SURGERY     JOINT REPLACEMENT     Left hip total Dr. [name] 10-01-17   MASTECTOMY Right 07/26/2019   POLYPECTOMY     polypectomy-oropharynx     RE-EXCISION OF BREAST CANCER,SUPERIOR MARGINS Right 04/08/2019   Procedure: RE-EXCISION OF RIGHT BREAST ANTERIOR MARGINS;  Surgeon: [name], MD;  Location: [facility];  Service: General;  Laterality: Right;   right knee meniscus     SBO  10/2016  small bowel obstruction   SIMPLE MASTECTOMY WITH AXILLARY SENTINEL NODE BIOPSY Right 07/26/2019   Procedure: RIGHT MASTECTOMY WITH SENTINEL NODE BIOPSY;  Surgeon: Jovita Kussmaul, MD;  Location: [place];  Service: General;  Laterality: Right;   TOTAL HIP ARTHROPLASTY Left 10/01/2017   Procedure: LEFT  TOTAL HIP ARTHROPLASTY ANTERIOR APPROACH;  Surgeon: [name], MD;  Location: [facility];  Service: Orthopedics;  Laterality: Left;   TOTAL HIP ARTHROPLASTY Right 03/11/2018   Procedure: RIGHT TOTAL HIP ARTHROPLASTY ANTERIOR APPROACH;  Surgeon: [name], MD;  Location: [facility];  Service: Orthopedics;  Laterality: Right;  160mn   UPPER GASTROINTESTINAL ENDOSCOPY      Allergies  Allergen Reactions    Lisinopril Swelling    Angioedema Angioedema Angioedema   Atorvastatin Other (See Comments)    Muscle aches   Muscle aches  Muscle aches     Social History   Socioeconomic History   Marital status: Widowed    Spouse name: Not on file   Number of children: 3   Years of education: 147  Highest education level: Some college, no degree  Occupational History   Occupation: retired  Tobacco Use   Smoking status: Former    Packs/day: 1.00    Years: 32.00    Total pack years: 32.00    Types: E-cigarettes, Cigarettes    Quit date: 11/20/2016    Years since quitting: 5.6   Smokeless tobacco: Never   Tobacco comments:    Smoker since 1975 has quit and started back several times!!,  Vaping Use   Vaping Use: Former  Substance and Sexual Activity   Alcohol use: No   Drug use: No   Sexual activity: Not Currently    Birth control/protection: Surgical    Comment: Hysterectomy  Other Topics Concern   Not on file  Social History Narrative   Widow since 2006.Married previously for 25 years.Lives with grandson and 2 great grandchildren.Retired.Previously office work.   Social Determinants of Health   Financial Resource Strain: Low Risk  (06/27/2021)   Overall Financial Resource Strain (CARDIA)    Difficulty of Paying Living Expenses: Not hard at all  Food Insecurity: No Food Insecurity (06/27/2021)   Hunger Vital Sign    Worried About Running Out of Food in the Last Year: Never true    Ran Out of Food in the Last Year: Never true  Transportation Needs: No Transportation Needs (06/27/2021)   PRAPARE - THydrologist(Medical): No    Lack of Transportation (Non-Medical): No  Physical Activity: Inactive (06/27/2021)   Exercise Vital Sign    Days of Exercise per Week: 0 days    Minutes of Exercise per Session: 0 min  Stress: No Stress Concern Present (06/27/2021)   [place]   Feeling of Stress  : Not at all  Social Connections: Moderately Integrated (06/27/2021)   Social Connection and Isolation Panel [NHANES]    Frequency of Communication with Friends and Family: More than three times a week    Frequency of Social Gatherings with Friends and Family: Once a week    Attends Religious Services: More than 4 times per year    Active Member of [employer] Organizations: Yes    Attends CArchivistMeetings: More than 4 times per year    Marital Status: Widowed  Intimate Partner Violence: Not At Risk (06/27/2021)   Humiliation, Afraid, Rape, and Kick questionnaire  Fear of Current or Ex-Partner: No    Emotionally Abused: No    Physically Abused: No    Sexually Abused: No    Family History  Problem Relation Age of Onset   Cancer Sister    Diabetes Brother    Diabetes Brother    Hypertension Other    Breast cancer Sister    Colon cancer Neg Hx    Colon polyps Neg Hx    Rectal cancer Neg Hx    Stomach cancer Neg Hx       Review of Systems Constitutional: negative for anorexia, fevers and sweats  Eyes: negative for irritation, redness and visual disturbance  Ears, nose, mouth, throat, and face: negative for earaches, epistaxis, nasal congestion and sore throat  Respiratory: negative for cough, dyspnea on exertion, sputum and wheezing  Cardiovascular: negative for chest pain, dyspnea, lower extremity edema, orthopnea, palpitations and syncope  Gastrointestinal: negative for abdominal pain, constipation, diarrhea, melena, nausea and vomiting  Genitourinary:negative for dysuria, frequency and hematuria  Hematologic/lymphatic: negative for bleeding, easy bruising and lymphadenopathy  Musculoskeletal:negative for arthralgias, muscle weakness and stiff joints  Neurological: negative for coordination problems, gait problems, headaches and weakness  Endocrine: negative for diabetic symptoms including polydipsia, polyuria and weight loss     Objective:   Physical Exam  Gen.  Pleasant, well-nourished, in no distress, normal affect ENT - no pallor,icterus, no post nasal drip Neck: No JVD, no thyromegaly, no carotid bruits Lungs: no use of accessory muscles, no dullness to percussion, fine right basal dry rales no rhonchi  Cardiovascular: Rhythm regular, heart sounds  normal, no murmurs or gallops, no peripheral edema Abdomen: soft and non-tender, no hepatosplenomegaly, BS normal. Musculoskeletal: No deformities, no cyanosis or clubbing Neuro:  alert, non focal       Assessment & Plan:

## 2022-07-16 NOTE — Assessment & Plan Note (Signed)
Appears to be very mild and relatively asymptomatic however she is sedentary and that may be the reason symptoms are not elicited. She does not desaturate significantly on walking but is limited by knee pain.  We will evaluate further with high-resolution CT scan-hopefully this will differentiate the pattern of fibrosis, I explained to her that I was mainly concerned about IPF in her age group.  She does not have any stigmata of collagen vascular disease. Will also obtain PFTs to establish baseline. Further plan based on results of above.  She does not provide any history of significant exposure in the past

## 2022-07-16 NOTE — Patient Instructions (Addendum)
You have mild fibrosis in your lungs X Amb sat  X HR CT chest  X Schedule PFTs

## 2022-07-27 ENCOUNTER — Other Ambulatory Visit: Payer: Self-pay | Admitting: Internal Medicine

## 2022-07-27 DIAGNOSIS — E039 Hypothyroidism, unspecified: Secondary | ICD-10-CM

## 2022-08-06 DIAGNOSIS — D0511 Intraductal carcinoma in situ of right breast: Secondary | ICD-10-CM | POA: Diagnosis not present

## 2022-08-06 NOTE — Progress Notes (Unsigned)
Cardiology Office Note:    Date:  08/08/2022   ID:  Adrienne, [name] [birth year], MRN [id number]  PCP:  Lindell Spar, MD   [facility] Providers Cardiologist:  Minus Breeding, MD {   Referring MD: Lindell Spar, MD   Chief Complaint  Patient presents with   Follow-up    Cardiomyopathy, bradycardia     History of Present Illness:    Adrienne [name] is a 77 y.o. female with a hx of CHF, intersitial lung disease, history of breast cancer, HTN, hypothyroidism, HLD. Patient is followed by Dr. Percival Spanish and presents today for follow up of cardiomyopathy, bradycardia. Patient also is scheduled to have cataract surgery in 09/2022, and would like to be evaluated prior to surgery because her HR has been low at doctors appointments in the past.   Patient previously had an EF of 44% in 2010.  Later, echocardiogram on 07/15/2013 showed EF 55-60% without regional wall motion abnormalities, mild mitral valve prolapse.  She complained of leg pain in 2022 and was evaluated by Dr. Gwenlyn Found.  She was found to have ABIs of 0.76 on the right and 10.73 on the left.  Dr. Gwenlyn Found recommended no further cardiovascular testing but that she continue to risk management.  Patient was last seen by cardiology on 08/02/2021.  At that time, patient reported she was doing well and was not having cardiovascular symptoms.  Denied chest discomfort, shortness of breath, PND, orthopnea, palpitations.  She had a CT for lung cancer screening on 07/11/2022 that showed small nodules, mild emphysema, and evidence of interstitial lung disease.  She was seen by pulmonology on 07/16/2022. It was noted that patient did have a 40-pack-year smoking history, however quit smoking in 2018.  Patient reported that she had some shortness of breath when she was in a rush, but was able to use the stairs at home and walk in a store without symptoms. She was not started on any new medications at that time.   Today, patient presents  for her yearly follow-up for cardiomyopathy. Patient also reports that her HR has been low at doctors offices in the past, and she would like to be evaluated prior to her cataract surgery in April. Reports that her surgeon did not request preoperative evaluation, but she would like to be seen. Patient reports that she has been doing very well from a cardiac perspective since last being seen. She denies dizziness, syncope, near syncope, palpitations, chest pain, shortness of breath. She tries to stay active, but is limited by hip and knee pain. She does not have chest pain on exertion or DOE. At her recent doctor's appointments, she has been told that her HR has been slow. Reports that her HR has always been slow, but she wants to get checked out.    Past Medical History:  Diagnosis Date   Allergy    Ambulates with cane    straight cane   Arthritis    knee, back   CHF (congestive heart failure) ([place])    Ductal carcinoma in situ of breast 12/2018   R Breast-mastectomy only   Gout    History of blood transfusion 1986   w/ Hysterectomy surgery   Hyperlipidemia    diet controlled - no meds   Hypertension    Hypothyroidism    Pelvic kidney    lower right pelvic kidney    Prediabetes    diet controlled - no meds   SBO (small bowel obstruction) ([place]) 10/2016  surgery    Sleep apnea    Mild - no mask needed per sleep study   Smoker    quit smoking 2018    Past Surgical History:  Procedure Laterality Date   [place]     pt states it was removed when gallbladder was removed.   BACK SURGERY     lower back   BREAST BIOPSY Right 05/12/2019   times 2   BREAST LUMPECTOMY WITH RADIOACTIVE SEED LOCALIZATION Right 03/18/2019   Procedure: RIGHT BREAST LUMPECTOMY WITH RADIOACTIVE SEED LOCALIZATION;  Surgeon: Jovita Kussmaul, MD;  Location: [facility];  Service: General;  Laterality: Right;   CHOLECYSTECTOMY     COLONOSCOPY  02/15/2008    [facility]    COLONOSCOPY WITH PROPOFOL  02/27/2018   Dr.Nandigam   ECTOPIC PREGNANCY SURGERY     JOINT REPLACEMENT     Left hip total Dr. [name] 10-01-17   MASTECTOMY Right 07/26/2019   POLYPECTOMY     polypectomy-oropharynx     RE-EXCISION OF BREAST CANCER,SUPERIOR MARGINS Right 04/08/2019   Procedure: RE-EXCISION OF RIGHT BREAST ANTERIOR MARGINS;  Surgeon: [name], MD;  Location: [facility];  Service: General;  Laterality: Right;   right knee meniscus     SBO  10/2016   small bowel obstruction   SIMPLE MASTECTOMY WITH AXILLARY SENTINEL NODE BIOPSY Right 07/26/2019   Procedure: RIGHT MASTECTOMY WITH SENTINEL NODE BIOPSY;  Surgeon: Jovita Kussmaul, MD;  Location: [facility];  Service: General;  Laterality: Right;   TOTAL HIP ARTHROPLASTY Left 10/01/2017   Procedure: LEFT  TOTAL HIP ARTHROPLASTY ANTERIOR APPROACH;  Surgeon: [name], MD;  Location: [facility];  Service: Orthopedics;  Laterality: Left;   TOTAL HIP ARTHROPLASTY Right 03/11/2018   Procedure: RIGHT TOTAL HIP ARTHROPLASTY ANTERIOR APPROACH;  Surgeon: [name], MD;  Location: [facility];  Service: Orthopedics;  Laterality: Right;  15mn   UPPER GASTROINTESTINAL ENDOSCOPY      Current Medications: Current Meds  Medication Sig   amLODipine (NORVASC) 10 MG tablet Take 1 tablet (10 mg total) by mouth daily.   celecoxib (CELEBREX) 100 MG capsule TAKE 1 CAPSULE BY MOUTH TWICE DAILY AS NEEDED FOR MILD TO MODERATE PAIN   Cholecalciferol (VITAMIN D-3) 125 MCG (5000 UT) TABS Take 2 tablets by mouth daily.   losartan (COZAAR) 100 MG tablet Take 1 tablet (100 mg total) by mouth daily.   Misc. Devices MISC Blood pressure cuff - 1 each. ICD10: I10.   NP THYROID 90 MG tablet Take 1 tablet by mouth once daily     Allergies:   Lisinopril and Atorvastatin   Social History   Socioeconomic History   Marital status: Widowed    Spouse name: Not on file   Number of children: 3   Years of education: 173  Highest  education level: Some college, no degree  Occupational History   Occupation: retired  Tobacco Use   Smoking status: Former    Packs/day: 1.00    Years: 32.00    Total pack years: 32.00    Types: E-cigarettes, Cigarettes    Quit date: 11/20/2016    Years since quitting: 5.7   Smokeless tobacco: Never   Tobacco comments:    Smoker since 1975 has quit and started back several times!!,  Vaping Use   Vaping Use: Former  Substance and Sexual Activity   Alcohol use: No   Drug use: No   Sexual activity: Not Currently  Birth control/protection: Surgical    Comment: Hysterectomy  Other Topics Concern   Not on file  Social History Narrative   Widow since 2006.Married previously for 25 years.Lives with grandson and 2 great grandchildren.Retired.Previously office work.   Social Determinants of Health   Financial Resource Strain: Low Risk  (06/27/2021)   Overall Financial Resource Strain (CARDIA)    Difficulty of Paying Living Expenses: Not hard at all  Food Insecurity: No Food Insecurity (06/27/2021)   Hunger Vital Sign    Worried About Running Out of Food in the Last Year: Never true    Ran Out of Food in the Last Year: Never true  Transportation Needs: No Transportation Needs (06/27/2021)   PRAPARE - Hydrologist (Medical): No    Lack of Transportation (Non-Medical): No  Physical Activity: Inactive (06/27/2021)   Exercise Vital Sign    Days of Exercise per Week: 0 days    Minutes of Exercise per Session: 0 min  Stress: No Stress Concern Present (06/27/2021)   [place]    Feeling of Stress : Not at all  Social Connections: Moderately Integrated (06/27/2021)   Social Connection and Isolation Panel [NHANES]    Frequency of Communication with Friends and Family: More than three times a week    Frequency of Social Gatherings with Friends and Family: Once a week    Attends Religious Services: More  than 4 times per year    Active Member of [employer] or Organizations: Yes    Attends Archivist Meetings: More than 4 times per year    Marital Status: Widowed     Family History: The patient's family history includes Breast cancer in her sister; Cancer in her sister; Diabetes in her brother and brother; Hypertension in an other family member. There is no history of Colon cancer, Colon polyps, Rectal cancer, or Stomach cancer.  ROS:   Please see the history of present illness.     All other systems reviewed and are negative.  EKGs/Labs/Other Studies Reviewed:    The following studies were reviewed today: Echocardiogram 07/15/13  - Left ventricle: The cavity size was normal. Wall thickness    was normal. Systolic function was normal. The estimated    ejection fraction was in the range of 55% to 60%. Wall    motion was normal; there were no regional wall motion    abnormalities.  - Mitral valve: Mild, late systolicprolapse.  - Atrial septum: No defect or patent foramen ovale was    identified.   EKG:  EKG is ordered today.  The ekg ordered today demonstrates bradycardia with HR 42 BPM, ectopic atrial rhythm  Recent Labs: 03/05/2022: ALT 24 06/06/2022: BUN 13; Creatinine, Ser 0.92; Potassium 4.4; Sodium 144; TSH 1.350  Recent Lipid Panel    Component Value Date/Time   CHOL 258 (H) 01/08/2022 1141   TRIG 136 01/08/2022 1141   HDL 62 01/08/2022 1141   CHOLHDL 4.2 01/08/2022 1141   CHOLHDL 4.8 12/05/2020 1027   VLDL 20 04/08/2016 0933   LDLCALC 172 (H) 01/08/2022 1141   LDLCALC 146 (H) 12/05/2020 1027     Risk Assessment/Calculations:      HYPERTENSION CONTROL Vitals:   08/08/22 0927 08/08/22 1027  BP: (!) 142/64 (!) 140/62    The patient's blood pressure is elevated above target today.  In order to address the patient's elevated BP: Blood pressure will be monitored at home to determine if  medication changes need to be made.            Physical Exam:     VS:  BP (!) 140/62   Pulse (!) 37   Ht 5' 3"$  (1.6 m)   Wt 192 lb 12.8 oz (87.5 kg)   BMI 34.15 kg/m     Wt Readings from Last 3 Encounters:  08/08/22 192 lb 12.8 oz (87.5 kg)  07/16/22 192 lb 3.2 oz (87.2 kg)  06/06/22 192 lb 9.6 oz (87.4 kg)     GEN: Well nourished, well developed in no acute distress. Sitting comfortably on the exam table  HEENT: Normal NECK: No JVD with head at 30 degrees; No carotid bruits CARDIAC: Regular rhythm, bradycardic. No murmurs, rubs, gallops. Radial pulses 2+ bilaterally.  RESPIRATORY:  Clear to auscultation without rales, wheezing or rhonchi. Normal WOB on room air  ABDOMEN: Soft, non-tender, non-distended MUSCULOSKELETAL:  No edema in BE; No deformity  SKIN: Warm and dry NEUROLOGIC:  Alert and oriented x 3 PSYCHIATRIC:  Normal affect   ASSESSMENT:    1. Nonischemic cardiomyopathy ([place])   2. Bradycardia   3. Primary hypertension   4. Dyslipidemia    PLAN:    In order of problems listed above:  History of Cardiomyopathy  Mild Mitral Valve Prolapse  - Patient did have an EF of 44% in 2010 - Most recent echocardiogram in 2015 showed EF 55-60% without regional wall motion abnormalities, with mild mitral valve prolapse.  - Patient reports doing well from a cardiac perspective. No DOE, chest pain, ankle edema, orthopnea. She is euvolemic on exam  - Continue losartan 100 mg daily. BMP from her PCP in 05/2022 showed K 4.4, renal function within normal limits while on losartan  - Patient previously on carvedilol, however it was discontinued due to bradycardia - Repeat Echocardiogram to reassess EF and mitral valve   Bradycardia  - I checked pulse rate manually at the start of the visit and found HR to be 42 BPM. Patient and I walked briskly around the office for a few minutes, and HR rose to 60 BPM appropriately. BP elevated to 140/62 in office today  - Patient is asymptomatic with her bradycardia- denies fatigue, weakness, syncope/near  syncope, dizziness - TSH from 05/2022 was within normal limits - Discussed with Dr. Percival Spanish (DOD and patient's primary cardiologist) who noted EKG was possibly an ectopic atrial rhythm. He recommended 3 day outpatient monitor for further evaluation.  - Ordered 3 day zio patch and echocardiogram as above   HTN  - BP elevated to 142/64 on arrival. Remained elevated to 140/62 on my recheck  - Patient declines starting new BP medications at this time. Reports that she does not like taking medications, and is actually trying to get off her BP medications.  - Continue losartan 100 mg daily and amlodipine 10 mg daily  - Sent patient home with a BP log. Also discussed low sodium diet and provided information on DASH diet   HLD  -Most recent lipid panel from 12/2021 showed LDL 172, HDL 62, triglycerides 136 - When last seen by Dr. Percival Spanish in 2/23, patient reported that she felt overmedicated and was unwilling to try any new medications.  - Today, patient remains unwilling to add new medications. Discussed low cholesterol diet and provided information on AVS     Medication Adjustments/Labs and Tests Ordered: Current medicines are reviewed at length with the patient today.  Concerns regarding medicines are outlined above.  Orders Placed This  Encounter  Procedures   LONG TERM MONITOR (3-14 DAYS)   EKG 12-Lead   ECHOCARDIOGRAM COMPLETE   No orders of the defined types were placed in this encounter.   Patient Instructions  Medication Instructions:  The current medical regimen is effective;  continue present plan and medications as directed. Please refer to the Current Medication list given to you today.  *If you need a refill on your cardiac medications before your next appointment, please call your pharmacy*  Lab Work: NONE If you have labs (blood work) drawn today and your tests are completely normal, you will receive your results only by: [facility] (if you have MyChart) OR A paper  copy in the mail If you have any lab test that is abnormal or we need to change your treatment, we will call you to review the results.  Testing/Procedures: Echocardiogram - Your physician has requested that you have an echocardiogram. Echocardiography is a painless test that uses sound waves to create images of your heart. It provides your doctor with information about the size and shape of your heart and how well your heart's chambers and valves are working. This procedure takes approximately one hour. There are no restrictions for this procedure.   Your physician has requested you wear a ZIO patch monitor for 3 days.   Follow-Up: At [facility], you and your health needs are our priority.  As part of our continuing mission to provide you with exceptional heart care, we have created designated Provider Care Teams.  These Care Teams include your primary Cardiologist (physician) and Advanced Practice Providers (APPs -  Physician Assistants and Nurse Practitioners) who all work together to provide you with the care you need, when you need it.  Your next appointment:   3-4 month(s)  Provider:   Minus Breeding, MD  or Vikki Ports, PA-C        Other Instructions PLEASE READ AND FOLLOW DASH DIET ATTACHED AND ALSO THE LOWER CHOLESTEROL DIET  ZIO XT- Long Term Monitor Instructions  Your physician has requested you wear a ZIO patch monitor for 3 days.  This is a single patch monitor. [employer] supplies one patch monitor per enrollment. Additional stickers are not available. Please do not apply patch if you will be having a Nuclear Stress Test,  Echocardiogram, Cardiac CT, MRI, or Chest Xray during the period you would be wearing the  monitor. The patch cannot be worn during these tests. You cannot remove and re-apply the  ZIO XT patch monitor.  Your ZIO patch monitor will be mailed 3 day USPS to your address on file. It may take 3-5 days  to receive your monitor after you have been  enrolled.  Once you have received your monitor, please review the enclosed instructions. Your monitor  has already been registered assigning a specific monitor serial # to you.  Billing and Patient Assistance Program Information  We have supplied [employer] with any of your insurance information on file for billing purposes. [employer] offers a sliding scale Patient Assistance Program for patients that do not have  insurance, or whose insurance does not completely cover the cost of the ZIO monitor.  You must apply for the Patient Assistance Program to qualify for this discounted rate.  To apply, please call [employer] at [phone], select option 4, select option 2, ask to apply for  Patient Assistance Program. Theodore Demark will ask your household income, and how many people  are in your household. They will quote your out-of-pocket cost  based on that information.  [employer] will also be able to set up a 39-month interest-free payment plan if needed.  Applying the monitor   Shave hair from upper left chest.  Hold abrader disc by orange tab. Rub abrader in 40 strokes over the upper left chest as  indicated in your monitor instructions.  Clean area with 4 enclosed alcohol pads. Let dry.  Apply patch as indicated in monitor instructions. Patch will be placed under collarbone on left  side of chest with arrow pointing upward.  Rub patch adhesive wings for 2 minutes. Remove white label marked "1". Remove the white  label marked "2". Rub patch adhesive wings for 2 additional minutes.  While looking in a mirror, press and release button in center of patch. A small green light will  flash 3-4 times. This will be your only indicator that the monitor has been turned on.  Do not shower for the first 24 hours. You may shower after the first 24 hours.  Press the button if you feel a symptom. You will hear a small click. Record Date, Time and  Symptom in the Patient Logbook.  When you are ready to remove the  patch, follow instructions on the last 2 pages of Patient  Logbook. Stick patch monitor onto the last page of Patient Logbook.  Place Patient Logbook in the blue and white box. Use locking tab on box and tape box closed  securely. The blue and white box has prepaid postage on it. Please place it in the mailbox as  soon as possible. Your physician should have your test results approximately 7 days after the  monitor has been [facility]  Call [facility] [place] [phone] you have questions regarding  your ZIO XT patch monitor. Call them immediately if you see an orange light blinking on your  monitor.  If your monitor falls off in less than 4 days, contact our Monitor department at [phone]  If your monitor becomes loose or falls off after 4 days call [employer] at [phone]  suggestions on securing your monitor   DASH Eating Plan DASH stands for Dietary Approaches to Stop Hypertension. The DASH eating plan is a healthy eating plan that has been shown to: Reduce high blood pressure (hypertension). Reduce your risk for type 2 diabetes, heart disease, and stroke. Help with weight loss. What are tips for following this plan? Reading food labels Check food labels for the amount of salt (sodium) per serving. Choose foods with less than 5 percent of the Daily Value of sodium. Generally, foods with less than 300 milligrams (mg) of sodium per serving fit into this eating plan. To find whole grains, look for the word "whole" as the first word in the ingredient list. Shopping Buy products labeled as "low-sodium" or "no salt added." Buy fresh foods. Avoid canned foods and pre-made or frozen meals. Cooking Avoid adding salt when cooking. Use salt-free seasonings or herbs instead of table salt or sea salt. Check with your health care provider or pharmacist before using salt substitutes. Do not fry foods. Cook foods using healthy methods such as baking,  boiling, grilling, roasting, and broiling instead. Cook with heart-healthy oils, such as olive, canola, avocado, soybean, or sunflower oil. Meal planning  Eat a balanced diet that includes: 4 or more servings of fruits and 4 or more servings of vegetables each day. Try to fill one-half of your plate with fruits and vegetables. 6-8 servings of whole grains  each day. Less than 6 oz (170 g) of lean meat, poultry, or fish each day. A 3-oz (85-g) serving of meat is about the same size as a deck of cards. One egg equals 1 oz (28 g). 2-3 servings of low-fat dairy each day. One serving is 1 cup (237 mL). 1 serving of nuts, seeds, or beans 5 times each week. 2-3 servings of heart-healthy fats. Healthy fats called omega-3 fatty acids are found in foods such as walnuts, flaxseeds, fortified milks, and eggs. These fats are also found in cold-water fish, such as sardines, salmon, and mackerel. Limit how much you eat of: Canned or prepackaged foods. Food that is high in trans fat, such as some fried foods. Food that is high in saturated fat, such as fatty meat. Desserts and other sweets, sugary drinks, and other foods with added sugar. Full-fat dairy products. Do not salt foods before eating. Do not eat more than 4 egg yolks a week. Try to eat at least 2 vegetarian meals a week. Eat more home-cooked food and less restaurant, buffet, and fast food. Lifestyle When eating at a restaurant, ask that your food be prepared with less salt or no salt, if possible. If you drink alcohol: Limit how much you use to: 0-1 drink a day for women who are not pregnant. 0-2 drinks a day for men. Be aware of how much alcohol is in your drink. In the [place] one drink equals one 12 oz bottle of beer (355 mL), one 5 oz glass of wine (148 mL), or one 1 oz glass of hard liquor (44 mL). General information Avoid eating more than 2,300 mg of salt a day. If you have hypertension, you may need to reduce your sodium intake to 1,500  mg a day. Work with your health care provider to maintain a healthy body weight or to lose weight. Ask what an ideal weight is for you. Get at least 30 minutes of exercise that causes your heart to beat faster (aerobic exercise) most days of the week. Activities may include walking, swimming, or biking. Work with your health care provider or dietitian to adjust your eating plan to your individual calorie needs. What foods should I eat? Fruits All fresh, dried, or frozen fruit. Canned fruit in natural juice (without added sugar). Vegetables Fresh or frozen vegetables (raw, steamed, roasted, or grilled). Low-sodium or reduced-sodium tomato and vegetable juice. Low-sodium or reduced-sodium tomato sauce and tomato paste. Low-sodium or reduced-sodium canned vegetables. Grains Whole-grain or whole-wheat bread. Whole-grain or whole-wheat pasta. Brown rice. [place]. Bulgur. Whole-grain and low-sodium cereals. Pita bread. Low-fat, low-sodium crackers. Whole-wheat flour tortillas. Meats and other proteins Skinless chicken or [place]. Ground chicken or [place]. Pork with fat trimmed off. Fish and seafood. Egg whites. Dried beans, peas, or lentils. Unsalted nuts, nut butters, and seeds. Unsalted canned beans. Lean cuts of beef with fat trimmed off. Low-sodium, lean precooked or cured meat, such as sausages or meat loaves. Dairy Low-fat (1%) or fat-free (skim) milk. Reduced-fat, low-fat, or fat-free cheeses. Nonfat, low-sodium ricotta or cottage cheese. Low-fat or nonfat yogurt. Low-fat, low-sodium cheese. Fats and oils Soft margarine without trans fats. Vegetable oil. Reduced-fat, low-fat, or light mayonnaise and salad dressings (reduced-sodium). Canola, safflower, olive, avocado, soybean, and sunflower oils. Avocado. Seasonings and condiments Herbs. Spices. Seasoning mixes without salt. Other foods Unsalted popcorn and pretzels. Fat-free sweets. The items listed above may not be a complete list of  foods and beverages you can eat. Contact a dietitian for more information.  What foods should I avoid? Fruits Canned fruit in a light or heavy syrup. Fried fruit. Fruit in cream or butter sauce. Vegetables Creamed or fried vegetables. Vegetables in a cheese sauce. Regular canned vegetables (not low-sodium or reduced-sodium). Regular canned tomato sauce and paste (not low-sodium or reduced-sodium). Regular tomato and vegetable juice (not low-sodium or reduced-sodium). Angie Fava. Olives. Grains Baked goods made with fat, such as croissants, muffins, or some breads. Dry pasta or rice meal packs. Meats and other proteins Fatty cuts of meat. Ribs. Fried meat. Berniece Salines. Bologna, salami, and other precooked or cured meats, such as sausages or meat loaves. Fat from the back of a pig (fatback). Bratwurst. Salted nuts and seeds. Canned beans with added salt. Canned or smoked fish. Whole eggs or egg yolks. Chicken or [place] with skin. Dairy Whole or 2% milk, cream, and half-and-half. Whole or full-fat cream cheese. Whole-fat or sweetened yogurt. Full-fat cheese. Nondairy creamers. Whipped toppings. Processed cheese and cheese spreads. Fats and oils Butter. Stick margarine. Lard. Shortening. Ghee. Bacon fat. Tropical oils, such as coconut, palm kernel, or palm oil. Seasonings and condiments Onion salt, garlic salt, seasoned salt, table salt, and sea salt. Worcestershire sauce. Tartar sauce. Barbecue sauce. Teriyaki sauce. Soy sauce, including reduced-sodium. Steak sauce. Canned and packaged gravies. Fish sauce. Oyster sauce. Cocktail sauce. Store-bought horseradish. Ketchup. Mustard. Meat flavorings and tenderizers. Bouillon cubes. Hot sauces. Pre-made or packaged marinades. Pre-made or packaged taco seasonings. Relishes. Regular salad dressings. Other foods Salted popcorn and pretzels. The items listed above may not be a complete list of foods and beverages you should avoid. Contact a dietitian for more  information. Where to find more information National Heart, Lung, and Blood Institute: https://wilson-eaton.com/ American Heart Association: www.heart.org Academy of Nutrition and Dietetics: www.eatright.[place]: www.kidney.org Summary The DASH eating plan is a healthy eating plan that has been shown to reduce high blood pressure (hypertension). It may also reduce your risk for type 2 diabetes, heart disease, and stroke. When on the DASH eating plan, aim to eat more fresh fruits and vegetables, whole grains, lean proteins, low-fat dairy, and heart-healthy fats. With the DASH eating plan, you should limit salt (sodium) intake to 2,300 mg a day. If you have hypertension, you may need to reduce your sodium intake to 1,500 mg a day. Work with your health care provider or dietitian to adjust your eating plan to your individual calorie needs. This information is not intended to replace advice given to you by your health care provider. Make sure you discuss any questions you have with your health care provider. Document Revised: 05/14/2019 Document Reviewed: 05/14/2019 Elsevier Patient Education  [place] and Cholesterol Restricted Eating Plan Getting too much fat and cholesterol in your diet may cause health problems. Choosing the right foods helps keep your fat and cholesterol at normal levels. This can keep you from getting certain diseases. What are tips for following this plan? General tips Work with your doctor to lose weight if you need to. Avoid: Foods with added sugar. Fried foods. Foods with trans fat or partially hydrogenated oils. This includes some margarines and baked goods. If you drink alcohol: Limit how much you have to: 0-1 drink a day for women who are not pregnant. 0-2 drinks a day for men. Know how much alcohol is in a drink. In the [place] one drink equals one 12 oz bottle of beer (355 mL), one 5 oz glass of wine (148 mL), or one 1 oz glass  of hard liquor (44 mL). Reading food labels Check food labels for: Trans fats. Partially hydrogenated oils. Saturated fat (g) in each serving. Cholesterol (mg) in each serving. Fiber (g) in each serving. Choose foods with healthy fats, such as: Monounsaturated fats and polyunsaturated fats. These include olive and canola oil, flaxseeds, walnuts, almonds, and seeds. Omega-3 fats. These are found in certain fish, flaxseed oil, and ground flaxseeds. Choose grain products that have whole grains. Look for the word "whole" as the first word in the ingredient list. Cooking Cook foods using low-fat methods. These include baking, boiling, grilling, and broiling. Eat more home-cooked foods. Eat at restaurants and buffets less often. Eat less fast food. Avoid cooking using saturated fats, such as butter, cream, palm oil, palm kernel oil, and coconut oil. Meal planning  At meals, divide your plate into four equal parts: Fill one-half of your plate with vegetables, green salads, and fruit. Fill one-fourth of your plate with whole grains. Fill one-fourth of your plate with low-fat (lean) protein foods. Eat fish that is high in omega-3 fats at least two times a week. This includes mackerel, tuna, sardines, and salmon. Eat foods that are high in fiber, such as whole grains, beans, apples, pears, berries, broccoli, carrots, peas, and barley. What foods should I eat? Fruits All fresh, canned (in natural juice), or frozen fruits. Vegetables Fresh or frozen vegetables (raw, steamed, roasted, or grilled). Green salads. Grains Whole grains, such as whole wheat or whole grain breads, crackers, cereals, and pasta. Unsweetened oatmeal, bulgur, barley, quinoa, or brown rice. Corn or whole wheat flour tortillas. Meats and other protein foods Ground beef (85% or leaner), grass-fed beef, or beef trimmed of fat. Skinless chicken or [place]. Ground chicken or [place]. Pork trimmed of fat. All fish and seafood. Egg  whites. Dried beans, peas, or lentils. Unsalted nuts or seeds. Unsalted canned beans. Nut butters without added sugar or oil. Dairy Low-fat or nonfat dairy products, such as skim or 1% milk, 2% or reduced-fat cheeses, low-fat and fat-free ricotta or cottage cheese, or plain low-fat and nonfat yogurt. Fats and oils Tub margarine without trans fats. Light or reduced-fat mayonnaise and salad dressings. Avocado. Olive, canola, sesame, or safflower oils. The items listed above may not be a complete list of foods and beverages you can eat. Contact a dietitian for more information. What foods should I avoid? Fruits Canned fruit in heavy syrup. Fruit in cream or butter sauce. Fried fruit. Vegetables Vegetables cooked in cheese, cream, or butter sauce. Fried vegetables. Grains White bread. White pasta. White rice. Cornbread. Bagels, pastries, and croissants. Crackers and snack foods that contain trans fat and hydrogenated oils. Meats and other protein foods Fatty cuts of meat. Ribs, chicken wings, bacon, sausage, bologna, salami, chitterlings, fatback, hot dogs, bratwurst, and packaged lunch meats. Liver and organ meats. Whole eggs and egg yolks. Chicken and [place] with skin. Fried meat. Dairy Whole or 2% milk, cream, half-and-half, and cream cheese. Whole milk cheeses. Whole-fat or sweetened yogurt. Full-fat cheeses. Nondairy creamers and whipped toppings. Processed cheese, cheese spreads, and cheese curds. Fats and oils Butter, stick margarine, lard, shortening, ghee, or bacon fat. Coconut, palm kernel, and palm oils. Beverages Alcohol. Sugar-sweetened drinks such as sodas, lemonade, and fruit drinks. Sweets and desserts Corn syrup, sugars, honey, and molasses. Candy. Jam and jelly. Syrup. Sweetened cereals. Cookies, pies, cakes, donuts, muffins, and ice cream. The items listed above may not be a complete list of foods and beverages you should avoid. Contact a dietitian for  more  information. Summary Choosing the right foods helps keep your fat and cholesterol at normal levels. This can keep you from getting certain diseases. At meals, fill one-half of your plate with vegetables, green salads, and fruits. Eat high fiber foods, like whole grains, beans, apples, pears, berries, carrots, peas, and barley. Limit added sugar, saturated fats, alcohol, and fried foods. This information is not intended to replace advice given to you by your health care provider. Make sure you discuss any questions you have with your health care provider. Document Revised: 10/20/2020 Document Reviewed: 10/20/2020 Elsevier Patient Education  [place], Margie Billet, [place]  08/08/2022 10:46 AM    [place]

## 2022-08-08 ENCOUNTER — Ambulatory Visit (INDEPENDENT_AMBULATORY_CARE_PROVIDER_SITE_OTHER): Payer: PPO

## 2022-08-08 ENCOUNTER — Encounter: Payer: Self-pay | Admitting: General Practice

## 2022-08-08 ENCOUNTER — Ambulatory Visit: Payer: PPO | Attending: General Practice | Admitting: Cardiology

## 2022-08-08 VITALS — BP 140/62 | HR 37 | Ht 63.0 in | Wt 192.8 lb

## 2022-08-08 DIAGNOSIS — I428 Other cardiomyopathies: Secondary | ICD-10-CM

## 2022-08-08 DIAGNOSIS — I10 Essential (primary) hypertension: Secondary | ICD-10-CM | POA: Diagnosis not present

## 2022-08-08 DIAGNOSIS — R001 Bradycardia, unspecified: Secondary | ICD-10-CM

## 2022-08-08 DIAGNOSIS — E785 Hyperlipidemia, unspecified: Secondary | ICD-10-CM

## 2022-08-08 NOTE — Progress Notes (Signed)
BP log, low-sodium diet discussed. Patient refuses new medications at this time

## 2022-08-08 NOTE — Patient Instructions (Addendum)
Medication Instructions:  The current medical regimen is effective;  continue present plan and medications as directed. Please refer to the Current Medication list given to you today.  *If you need a refill on your cardiac medications before your next appointment, please call your pharmacy*  Lab Work: NONE If you have labs (blood work) drawn today and your tests are completely normal, you will receive your results only by: [place] (if you have MyChart) OR A paper copy in the mail If you have any lab test that is abnormal or we need to change your treatment, we will call you to review the results.  Testing/Procedures: Echocardiogram - Your physician has requested that you have an echocardiogram. Echocardiography is a painless test that uses sound waves to create images of your heart. It provides your doctor with information about the size and shape of your heart and how well your heart's chambers and valves are working. This procedure takes approximately one hour. There are no restrictions for this procedure.   Your physician has requested you wear a ZIO patch monitor for 3 days.   Follow-Up: At [facility], you and your health needs are our priority.  As part of our continuing mission to provide you with exceptional heart care, we have created designated Provider Care Teams.  These Care Teams include your primary Cardiologist (physician) and Advanced Practice Providers (APPs -  Physician Assistants and Nurse Practitioners) who all work together to provide you with the care you need, when you need it.  Your next appointment:   3-4 month(s)  Provider:   Minus Breeding, MD  or Vikki Ports, PA-C        Other Instructions PLEASE READ AND FOLLOW DASH DIET ATTACHED AND ALSO THE LOWER CHOLESTEROL DIET  ZIO XT- Long Term Monitor Instructions  Your physician has requested you wear a ZIO patch monitor for 3 days.  This is a single patch monitor. [employer] supplies one patch  monitor per enrollment. Additional stickers are not available. Please do not apply patch if you will be having a Nuclear Stress Test,  Echocardiogram, Cardiac CT, MRI, or Chest Xray during the period you would be wearing the  monitor. The patch cannot be worn during these tests. You cannot remove and re-apply the  ZIO XT patch monitor.  Your ZIO patch monitor will be mailed 3 day USPS to your address on file. It may take 3-5 days  to receive your monitor after you have been enrolled.  Once you have received your monitor, please review the enclosed instructions. Your monitor  has already been registered assigning a specific monitor serial # to you.  Billing and Patient Assistance Program Information  We have supplied [employer] with any of your insurance information on file for billing purposes. [employer] offers a sliding scale Patient Assistance Program for patients that do not have  insurance, or whose insurance does not completely cover the cost of the ZIO monitor.  You must apply for the Patient Assistance Program to qualify for this discounted rate.  To apply, please call [employer] at [phone], select option 4, select option 2, ask to apply for  Patient Assistance Program. Theodore Demark will ask your household income, and how many people  are in your household. They will quote your out-of-pocket cost based on that information.  [employer] will also be able to set up a 48-month interest-free payment plan if needed.  Applying the monitor   Shave hair from upper left chest.  Hold abrader disc by  orange tab. Rub abrader in 40 strokes over the upper left chest as  indicated in your monitor instructions.  Clean area with 4 enclosed alcohol pads. Let dry.  Apply patch as indicated in monitor instructions. Patch will be placed under collarbone on left  side of chest with arrow pointing upward.  Rub patch adhesive wings for 2 minutes. Remove white label marked "1". Remove the white  label marked "2".  Rub patch adhesive wings for 2 additional minutes.  While looking in a mirror, press and release button in center of patch. A small green light will  flash 3-4 times. This will be your only indicator that the monitor has been turned on.  Do not shower for the first 24 hours. You may shower after the first 24 hours.  Press the button if you feel a symptom. You will hear a small click. Record Date, Time and  Symptom in the Patient Logbook.  When you are ready to remove the patch, follow instructions on the last 2 pages of Patient  Logbook. Stick patch monitor onto the last page of Patient Logbook.  Place Patient Logbook in the blue and white box. Use locking tab on box and tape box closed  securely. The blue and white box has prepaid postage on it. Please place it in the mailbox as  soon as possible. Your physician should have your test results approximately 7 days after the  monitor has been mailed back to [facility].  Call [place] at [phone] if you have questions regarding  your ZIO XT patch monitor. Call them immediately if you see an orange light blinking on your  monitor.  If your monitor falls off in less than 4 days, contact our Monitor department at [phone].  If your monitor becomes loose or falls off after 4 days call [employer] at [phone] for  suggestions on securing your monitor   DASH Eating Plan DASH stands for Dietary Approaches to Stop Hypertension. The DASH eating plan is a healthy eating plan that has been shown to: Reduce high blood pressure (hypertension). Reduce your risk for type 2 diabetes, heart disease, and stroke. Help with weight loss. What are tips for following this plan? Reading food labels Check food labels for the amount of salt (sodium) per serving. Choose foods with less than 5 percent of the Daily Value of sodium. Generally, foods with less than 300 milligrams (mg) of sodium per serving fit into this eating plan. To  find whole grains, look for the word "whole" as the first word in the ingredient list. Shopping Buy products labeled as "low-sodium" or "no salt added." Buy fresh foods. Avoid canned foods and pre-made or frozen meals. Cooking Avoid adding salt when cooking. Use salt-free seasonings or herbs instead of table salt or sea salt. Check with your health care provider or pharmacist before using salt substitutes. Do not fry foods. Cook foods using healthy methods such as baking, boiling, grilling, roasting, and broiling instead. Cook with heart-healthy oils, such as olive, canola, avocado, soybean, or sunflower oil. Meal planning  Eat a balanced diet that includes: 4 or more servings of fruits and 4 or more servings of vegetables each day. Try to fill one-half of your plate with fruits and vegetables. 6-8 servings of whole grains each day. Less than 6 oz (170 g) of lean meat, poultry, or fish each day. A 3-oz (85-g) serving of meat is about the same size as a deck of cards. One egg equals 1 oz (  28 g). 2-3 servings of low-fat dairy each day. One serving is 1 cup (237 mL). 1 serving of nuts, seeds, or beans 5 times each week. 2-3 servings of heart-healthy fats. Healthy fats called omega-3 fatty acids are found in foods such as walnuts, flaxseeds, fortified milks, and eggs. These fats are also found in cold-water fish, such as sardines, salmon, and mackerel. Limit how much you eat of: Canned or prepackaged foods. Food that is high in trans fat, such as some fried foods. Food that is high in saturated fat, such as fatty meat. Desserts and other sweets, sugary drinks, and other foods with added sugar. Full-fat dairy products. Do not salt foods before eating. Do not eat more than 4 egg yolks a week. Try to eat at least 2 vegetarian meals a week. Eat more home-cooked food and less restaurant, buffet, and fast food. Lifestyle When eating at a restaurant, ask that your food be prepared with less salt or  no salt, if possible. If you drink alcohol: Limit how much you use to: 0-1 drink a day for women who are not pregnant. 0-2 drinks a day for men. Be aware of how much alcohol is in your drink. In the [place] one drink equals one 12 oz bottle of beer (355 mL), one 5 oz glass of wine (148 mL), or one 1 oz glass of hard liquor (44 mL). General information Avoid eating more than 2,300 mg of salt a day. If you have hypertension, you may need to reduce your sodium intake to 1,500 mg a day. Work with your health care provider to maintain a healthy body weight or to lose weight. Ask what an ideal weight is for you. Get at least 30 minutes of exercise that causes your heart to beat faster (aerobic exercise) most days of the week. Activities may include walking, swimming, or biking. Work with your health care provider or dietitian to adjust your eating plan to your individual calorie needs. What foods should I eat? Fruits All fresh, dried, or frozen fruit. Canned fruit in natural juice (without added sugar). Vegetables Fresh or frozen vegetables (raw, steamed, roasted, or grilled). Low-sodium or reduced-sodium tomato and vegetable juice. Low-sodium or reduced-sodium tomato sauce and tomato paste. Low-sodium or reduced-sodium canned vegetables. Grains Whole-grain or whole-wheat bread. Whole-grain or whole-wheat pasta. Brown rice. [place]. Bulgur. Whole-grain and low-sodium cereals. Pita bread. Low-fat, low-sodium crackers. Whole-wheat flour tortillas. Meats and other proteins Skinless chicken or [place]. Ground chicken or [place]. Pork with fat trimmed off. Fish and seafood. Egg whites. Dried beans, peas, or lentils. Unsalted nuts, nut butters, and seeds. Unsalted canned beans. Lean cuts of beef with fat trimmed off. Low-sodium, lean precooked or cured meat, such as sausages or meat loaves. Dairy Low-fat (1%) or fat-free (skim) milk. Reduced-fat, low-fat, or fat-free cheeses. Nonfat, low-sodium ricotta  or cottage cheese. Low-fat or nonfat yogurt. Low-fat, low-sodium cheese. Fats and oils Soft margarine without trans fats. Vegetable oil. Reduced-fat, low-fat, or light mayonnaise and salad dressings (reduced-sodium). Canola, safflower, olive, avocado, soybean, and sunflower oils. Avocado. Seasonings and condiments Herbs. Spices. Seasoning mixes without salt. Other foods Unsalted popcorn and pretzels. Fat-free sweets. The items listed above may not be a complete list of foods and beverages you can eat. Contact a dietitian for more information. What foods should I avoid? Fruits Canned fruit in a light or heavy syrup. Fried fruit. Fruit in cream or butter sauce. Vegetables Creamed or fried vegetables. Vegetables in a cheese sauce. Regular canned vegetables (not low-sodium  or reduced-sodium). Regular canned tomato sauce and paste (not low-sodium or reduced-sodium). Regular tomato and vegetable juice (not low-sodium or reduced-sodium). Angie Fava. Olives. Grains Baked goods made with fat, such as croissants, muffins, or some breads. Dry pasta or rice meal packs. Meats and other proteins Fatty cuts of meat. Ribs. Fried meat. Berniece Salines. Bologna, salami, and other precooked or cured meats, such as sausages or meat loaves. Fat from the back of a pig (fatback). Bratwurst. Salted nuts and seeds. Canned beans with added salt. Canned or smoked fish. Whole eggs or egg yolks. Chicken or [place] with skin. Dairy Whole or 2% milk, cream, and half-and-half. Whole or full-fat cream cheese. Whole-fat or sweetened yogurt. Full-fat cheese. Nondairy creamers. Whipped toppings. Processed cheese and cheese spreads. Fats and oils Butter. Stick margarine. Lard. Shortening. Ghee. Bacon fat. Tropical oils, such as coconut, palm kernel, or palm oil. Seasonings and condiments Onion salt, garlic salt, seasoned salt, table salt, and sea salt. Worcestershire sauce. Tartar sauce. Barbecue sauce. Teriyaki sauce. Soy sauce, including  reduced-sodium. Steak sauce. Canned and packaged gravies. Fish sauce. Oyster sauce. Cocktail sauce. Store-bought horseradish. Ketchup. Mustard. Meat flavorings and tenderizers. Bouillon cubes. Hot sauces. Pre-made or packaged marinades. Pre-made or packaged taco seasonings. Relishes. Regular salad dressings. Other foods Salted popcorn and pretzels. The items listed above may not be a complete list of foods and beverages you should avoid. Contact a dietitian for more information. Where to find more information National Heart, Lung, and Blood Institute: https://wilson-eaton.com/ American Heart Association: www.heart.org Academy of Nutrition and Dietetics: www.eatright.[place]: www.kidney.org Summary The DASH eating plan is a healthy eating plan that has been shown to reduce high blood pressure (hypertension). It may also reduce your risk for type 2 diabetes, heart disease, and stroke. When on the DASH eating plan, aim to eat more fresh fruits and vegetables, whole grains, lean proteins, low-fat dairy, and heart-healthy fats. With the DASH eating plan, you should limit salt (sodium) intake to 2,300 mg a day. If you have hypertension, you may need to reduce your sodium intake to 1,500 mg a day. Work with your health care provider or dietitian to adjust your eating plan to your individual calorie needs. This information is not intended to replace advice given to you by your health care provider. Make sure you discuss any questions you have with your health care provider. Document Revised: 05/14/2019 Document Reviewed: 05/14/2019 Elsevier Patient Education  [place] and Cholesterol Restricted Eating Plan Getting too much fat and cholesterol in your diet may cause health problems. Choosing the right foods helps keep your fat and cholesterol at normal levels. This can keep you from getting certain diseases. What are tips for following this plan? General tips Work with  your doctor to lose weight if you need to. Avoid: Foods with added sugar. Fried foods. Foods with trans fat or partially hydrogenated oils. This includes some margarines and baked goods. If you drink alcohol: Limit how much you have to: 0-1 drink a day for women who are not pregnant. 0-2 drinks a day for men. Know how much alcohol is in a drink. In the [place] one drink equals one 12 oz bottle of beer (355 mL), one 5 oz glass of wine (148 mL), or one 1 oz glass of hard liquor (44 mL). Reading food labels Check food labels for: Trans fats. Partially hydrogenated oils. Saturated fat (g) in each serving. Cholesterol (mg) in each serving. Fiber (g) in each serving. Choose foods with  healthy fats, such as: Monounsaturated fats and polyunsaturated fats. These include olive and canola oil, flaxseeds, walnuts, almonds, and seeds. Omega-3 fats. These are found in certain fish, flaxseed oil, and ground flaxseeds. Choose grain products that have whole grains. Look for the word "whole" as the first word in the ingredient list. Cooking Cook foods using low-fat methods. These include baking, boiling, grilling, and broiling. Eat more home-cooked foods. Eat at restaurants and buffets less often. Eat less fast food. Avoid cooking using saturated fats, such as butter, cream, palm oil, palm kernel oil, and coconut oil. Meal planning  At meals, divide your plate into four equal parts: Fill one-half of your plate with vegetables, green salads, and fruit. Fill one-fourth of your plate with whole grains. Fill one-fourth of your plate with low-fat (lean) protein foods. Eat fish that is high in omega-3 fats at least two times a week. This includes mackerel, tuna, sardines, and salmon. Eat foods that are high in fiber, such as whole grains, beans, apples, pears, berries, broccoli, carrots, peas, and barley. What foods should I eat? Fruits All fresh, canned (in natural juice), or frozen  fruits. Vegetables Fresh or frozen vegetables (raw, steamed, roasted, or grilled). Green salads. Grains Whole grains, such as whole wheat or whole grain breads, crackers, cereals, and pasta. Unsweetened oatmeal, bulgur, barley, quinoa, or brown rice. Corn or whole wheat flour tortillas. Meats and other protein foods Ground beef (85% or leaner), grass-fed beef, or beef trimmed of fat. Skinless chicken or [place]. Ground chicken or [place]. Pork trimmed of fat. All fish and seafood. Egg whites. Dried beans, peas, or lentils. Unsalted nuts or seeds. Unsalted canned beans. Nut butters without added sugar or oil. Dairy Low-fat or nonfat dairy products, such as skim or 1% milk, 2% or reduced-fat cheeses, low-fat and fat-free ricotta or cottage cheese, or plain low-fat and nonfat yogurt. Fats and oils Tub margarine without trans fats. Light or reduced-fat mayonnaise and salad dressings. Avocado. Olive, canola, sesame, or safflower oils. The items listed above may not be a complete list of foods and beverages you can eat. Contact a dietitian for more information. What foods should I avoid? Fruits Canned fruit in heavy syrup. Fruit in cream or butter sauce. Fried fruit. Vegetables Vegetables cooked in cheese, cream, or butter sauce. Fried vegetables. Grains White bread. White pasta. White rice. Cornbread. Bagels, pastries, and croissants. Crackers and snack foods that contain trans fat and hydrogenated oils. Meats and other protein foods Fatty cuts of meat. Ribs, chicken wings, bacon, sausage, bologna, salami, chitterlings, fatback, hot dogs, bratwurst, and packaged lunch meats. Liver and organ meats. Whole eggs and egg yolks. Chicken and [place] with skin. Fried meat. Dairy Whole or 2% milk, cream, half-and-half, and cream cheese. Whole milk cheeses. Whole-fat or sweetened yogurt. Full-fat cheeses. Nondairy creamers and whipped toppings. Processed cheese, cheese spreads, and cheese curds. Fats and  oils Butter, stick margarine, lard, shortening, ghee, or bacon fat. Coconut, palm kernel, and palm oils. Beverages Alcohol. Sugar-sweetened drinks such as sodas, lemonade, and fruit drinks. Sweets and desserts Corn syrup, sugars, honey, and molasses. Candy. Jam and jelly. Syrup. Sweetened cereals. Cookies, pies, cakes, donuts, muffins, and ice cream. The items listed above may not be a complete list of foods and beverages you should avoid. Contact a dietitian for more information. Summary Choosing the right foods helps keep your fat and cholesterol at normal levels. This can keep you from getting certain diseases. At meals, fill one-half of your plate with vegetables, green salads,  and fruits. Eat high fiber foods, like whole grains, beans, apples, pears, berries, carrots, peas, and barley. Limit added sugar, saturated fats, alcohol, and fried foods. This information is not intended to replace advice given to you by your health care provider. Make sure you discuss any questions you have with your health care provider. Document Revised: 10/20/2020 Document Reviewed: 10/20/2020 Elsevier Patient Education  [place].

## 2022-08-08 NOTE — Progress Notes (Unsigned)
Enrolled for [employer] to mail a ZIO XT long term holter monitor to the patients address on file.   Dr. Percival Spanish to read.

## 2022-08-13 DIAGNOSIS — R001 Bradycardia, unspecified: Secondary | ICD-10-CM | POA: Diagnosis not present

## 2022-08-22 ENCOUNTER — Other Ambulatory Visit: Payer: Self-pay | Admitting: Internal Medicine

## 2022-08-22 ENCOUNTER — Encounter: Payer: Self-pay | Admitting: Radiology

## 2022-08-22 DIAGNOSIS — E039 Hypothyroidism, unspecified: Secondary | ICD-10-CM

## 2022-08-26 DIAGNOSIS — R001 Bradycardia, unspecified: Secondary | ICD-10-CM | POA: Diagnosis not present

## 2022-09-05 ENCOUNTER — Ambulatory Visit (HOSPITAL_COMMUNITY)
Admission: RE | Admit: 2022-09-05 | Discharge: 2022-09-05 | Disposition: A | Discharge status: Home / Self Care | Payer: PPO | Source: Ambulatory Visit | Attending: Pulmonary Disease | Admitting: Pulmonary Disease

## 2022-09-05 DIAGNOSIS — J479 Bronchiectasis, uncomplicated: Secondary | ICD-10-CM | POA: Diagnosis not present

## 2022-09-05 DIAGNOSIS — J432 Centrilobular emphysema: Secondary | ICD-10-CM | POA: Diagnosis not present

## 2022-09-05 DIAGNOSIS — J849 Interstitial pulmonary disease, unspecified: Secondary | ICD-10-CM | POA: Diagnosis not present

## 2022-09-05 DIAGNOSIS — R918 Other nonspecific abnormal finding of lung field: Secondary | ICD-10-CM | POA: Diagnosis not present

## 2022-09-10 ENCOUNTER — Ambulatory Visit (HOSPITAL_COMMUNITY)
Admission: RE | Admit: 2022-09-10 | Discharge: 2022-09-10 | Disposition: A | Discharge status: Home / Self Care | Payer: PPO | Source: Ambulatory Visit | Attending: Pulmonary Disease | Admitting: Pulmonary Disease

## 2022-09-10 DIAGNOSIS — J849 Interstitial pulmonary disease, unspecified: Secondary | ICD-10-CM | POA: Diagnosis not present

## 2022-09-10 MED ORDER — ALBUTEROL SULFATE (2.5 MG/3ML) 0.083% IN NEBU
2.5000 mg | INHALATION_SOLUTION | Freq: Once | RESPIRATORY_TRACT | Status: AC
  Administered 2022-09-10: 2.5 mg via RESPIRATORY_TRACT

## 2022-09-11 ENCOUNTER — Other Ambulatory Visit: Payer: Self-pay

## 2022-09-11 DIAGNOSIS — J849 Interstitial pulmonary disease, unspecified: Secondary | ICD-10-CM

## 2022-09-11 NOTE — Progress Notes (Signed)
ENA x 5, ACE level, CCP, CK (only,not MB) please   From CT and PFT result note. Patient made aware she can have labs done at [facility]

## 2022-09-12 ENCOUNTER — Ambulatory Visit (HOSPITAL_COMMUNITY): Payer: PPO | Attending: Cardiology

## 2022-09-12 DIAGNOSIS — I428 Other cardiomyopathies: Secondary | ICD-10-CM | POA: Diagnosis not present

## 2022-09-12 DIAGNOSIS — R001 Bradycardia, unspecified: Secondary | ICD-10-CM | POA: Diagnosis not present

## 2022-09-12 LAB — PULMONARY FUNCTION TEST
DL/VA % pred: 87 %
DL/VA: 3.61 ml/min/mmHg/L
DLCO unc % pred: 48 %
DLCO unc: 8.93 ml/min/mmHg
FEF 25-75 Post: 1.69 L/sec
FEF 25-75 Pre: 1.47 L/sec
FEF2575-%Change-Post: 14 %
FEF2575-%Pred-Post: 105 %
FEF2575-%Pred-Pre: 91 %
FEV1-%Change-Post: 2 %
FEV1-%Pred-Post: 77 %
FEV1-%Pred-Pre: 75 %
FEV1-Post: 1.57 L
FEV1-Pre: 1.53 L
FEV1FVC-%Change-Post: 5 %
FEV1FVC-%Pred-Pre: 105 %
FEV6-%Change-Post: -3 %
FEV6-%Pred-Post: 72 %
FEV6-%Pred-Pre: 74 %
FEV6-Post: 1.85 L
FEV6-Pre: 1.91 L
FEV6FVC-%Change-Post: 0 %
FEV6FVC-%Pred-Post: 103 %
FEV6FVC-%Pred-Pre: 103 %
FVC-%Change-Post: -2 %
FVC-%Pred-Post: 70 %
FVC-%Pred-Pre: 71 %
FVC-Post: 1.89 L
Post FEV1/FVC ratio: 83 %
Post FEV6/FVC ratio: 98 %
Pre FEV1/FVC ratio: 79 %
Pre FEV6/FVC Ratio: 99 %
RV % pred: 56 %
RV: 1.26 L
TLC % pred: 68 %
TLC: 3.38 L

## 2022-09-12 LAB — ECHOCARDIOGRAM COMPLETE
Area-P 1/2: 2.93 cm2
S' Lateral: 3.2 cm

## 2022-09-12 NOTE — Addendum Note (Signed)
Addended by: Rigoberto Noel on: 09/12/2022 10:02 AM   Modules accepted: Orders

## 2022-09-16 ENCOUNTER — Other Ambulatory Visit: Payer: Self-pay | Admitting: Internal Medicine

## 2022-09-16 DIAGNOSIS — M1712 Unilateral primary osteoarthritis, left knee: Secondary | ICD-10-CM

## 2022-09-17 ENCOUNTER — Ambulatory Visit (INDEPENDENT_AMBULATORY_CARE_PROVIDER_SITE_OTHER): Payer: PPO | Admitting: Internal Medicine

## 2022-09-17 DIAGNOSIS — Z0000 Encounter for general adult medical examination without abnormal findings: Secondary | ICD-10-CM

## 2022-09-17 NOTE — Progress Notes (Signed)
Subjective:  This is a telephone encounter between [employer] and Adrienne [name] on 09/17/2022 for AWV. The visit was conducted with the patient located at home and Adrienne [name] at [facility]. The patient's identity was confirmed using their DOB and current address. The patient has consented to being evaluated through a telephone encounter and understands the associated risks (an examination cannot be done and the patient may need to come in for an appointment) / benefits (allows the patient to remain at home, decreasing exposure to coronavirus).     Adrienne [name] is a 77 y.o. female who presents for Medicare Annual (Subsequent) preventive examination.  Review of Systems    Review of Systems  All other systems reviewed and are negative.    Objective:    There were no vitals filed for this visit. There is no height or weight on file to calculate BMI.     09/17/2022    3:10 PM 06/27/2021   11:29 AM 07/26/2019   10:17 AM 07/20/2019    9:38 AM 04/08/2019    6:55 AM 04/06/2019   10:55 AM 03/15/2019    8:14 AM  Advanced Directives  Does Patient Have a Medical Advance Directive? No No No No No No No  Would patient like information on creating a medical advance directive? Yes (ED - Information included in AVS) No - Patient declined No - Patient declined No - Patient declined No - Patient declined No - Patient declined Yes (MAU/Ambulatory/Procedural Areas - Information given)    Current Medications (verified) Outpatient Encounter Medications as of 09/17/2022  Medication Sig   amLODipine (NORVASC) 10 MG tablet Take 1 tablet (10 mg total) by mouth daily.   celecoxib (CELEBREX) 100 MG capsule TAKE 1 CAPSULE BY MOUTH TWICE DAILY AS NEEDED FOR MILD TO MODERATE PAIN   Cholecalciferol (VITAMIN D-3) 125 MCG (5000 UT) TABS Take 2 tablets by mouth daily.   losartan (COZAAR) 100 MG tablet Take 1 tablet (100 mg total) by mouth daily.   Misc. Devices MISC Blood pressure cuff - 1 each. ICD10:  I10.   NP THYROID 90 MG tablet Take 1 tablet by mouth once daily   [DISCONTINUED] meclizine (ANTIVERT) 25 MG tablet Take 1 tablet (25 mg total) by mouth 3 (three) times daily as needed for dizziness. (Patient not taking: Reported on 08/08/2022)   No facility-administered encounter medications on file as of 09/17/2022.    Allergies (verified) Lisinopril and Atorvastatin   History: Past Medical History:  Diagnosis Date   Allergy    Ambulates with cane    straight cane   Arthritis    knee, back   CHF (congestive heart failure) ([place])    Ductal carcinoma in situ of breast 12/2018   R Breast-mastectomy only   Gout    History of blood transfusion 1986   w/ Hysterectomy surgery   Hyperlipidemia    diet controlled - no meds   Hypertension    Hypothyroidism    Pelvic kidney    lower right pelvic kidney    Prediabetes    diet controlled - no meds   SBO (small bowel obstruction) ([place]) 10/2016   surgery    Sleep apnea    Mild - no mask needed per sleep study   Smoker    quit smoking 2018   Past Surgical History:  Procedure Laterality Date   [place]     pt states it was removed when gallbladder was  removed.   BACK SURGERY     lower back   BREAST BIOPSY Right 05/12/2019   times 2   BREAST LUMPECTOMY WITH RADIOACTIVE SEED LOCALIZATION Right 03/18/2019   Procedure: RIGHT BREAST LUMPECTOMY WITH RADIOACTIVE SEED LOCALIZATION;  Surgeon: Jovita Kussmaul, MD;  Location: [facility];  Service: General;  Laterality: Right;   CHOLECYSTECTOMY     COLONOSCOPY  02/15/2008   [facility]    COLONOSCOPY WITH PROPOFOL  02/27/2018   Dr.Nandigam   ECTOPIC PREGNANCY SURGERY     JOINT REPLACEMENT     Left hip total Dr. [name] 10-01-17   MASTECTOMY Right 07/26/2019   POLYPECTOMY     polypectomy-oropharynx     RE-EXCISION OF BREAST CANCER,SUPERIOR MARGINS Right 04/08/2019   Procedure: RE-EXCISION OF RIGHT BREAST ANTERIOR MARGINS;  Surgeon: [name], MD;  Location: [facility];  Service: General;  Laterality: Right;   right knee meniscus     SBO  10/2016   small bowel obstruction   SIMPLE MASTECTOMY WITH AXILLARY SENTINEL NODE BIOPSY Right 07/26/2019   Procedure: RIGHT MASTECTOMY WITH SENTINEL NODE BIOPSY;  Surgeon: Jovita Kussmaul, MD;  Location: [facility];  Service: General;  Laterality: Right;   TOTAL HIP ARTHROPLASTY Left 10/01/2017   Procedure: LEFT  TOTAL HIP ARTHROPLASTY ANTERIOR APPROACH;  Surgeon: [name], MD;  Location: [facility];  Service: Orthopedics;  Laterality: Left;   TOTAL HIP ARTHROPLASTY Right 03/11/2018   Procedure: RIGHT TOTAL HIP ARTHROPLASTY ANTERIOR APPROACH;  Surgeon: [name], MD;  Location: [facility];  Service: Orthopedics;  Laterality: Right;  154min   UPPER GASTROINTESTINAL ENDOSCOPY     Family History  Problem Relation Age of Onset   Cancer Sister    Diabetes Brother    Diabetes Brother    Hypertension Other    Breast cancer Sister    Colon cancer Neg Hx    Colon polyps Neg Hx    Rectal cancer Neg Hx    Stomach cancer Neg Hx    Social History   Socioeconomic History   Marital status: Widowed    Spouse name: Not on file   Number of children: 3   Years of education: 28   Highest education level: Some college, no degree  Occupational History   Occupation: retired  Tobacco Use   Smoking status: Former    Packs/day: 1.00    Years: 32.00    Additional pack years: 0.00    Total pack years: 32.00    Types: E-cigarettes, Cigarettes    Quit date: 11/20/2016    Years since quitting: 5.8   Smokeless tobacco: Never   Tobacco comments:    Smoker since 1975 has quit and started back several times!!,  Vaping Use   Vaping Use: Former  Substance and Sexual Activity   Alcohol use: No   Drug use: No   Sexual activity: Not Currently    Birth control/protection: Surgical    Comment: Hysterectomy  Other Topics Concern   Not on file  Social History Narrative   Widow since  2006.Married previously for 25 years.Lives with grandson and 2 great grandchildren.Retired.Previously office work.   Social Determinants of Health   Financial Resource Strain: Low Risk  (06/27/2021)   Overall Financial Resource Strain (CARDIA)    Difficulty of Paying Living Expenses: Not hard at all  Food Insecurity: No Food Insecurity (06/27/2021)   Hunger Vital Sign    Worried About Running Out of Food in the Last Year: Never true  Ran Out of Food in the Last Year: Never true  Transportation Needs: No Transportation Needs (06/27/2021)   PRAPARE - Hydrologist (Medical): No    Lack of Transportation (Non-Medical): No  Physical Activity: Inactive (06/27/2021)   Exercise Vital Sign    Days of Exercise per Week: 0 days    Minutes of Exercise per Session: 0 min  Stress: No Stress Concern Present (06/27/2021)   [place]    Feeling of Stress : Not at all  Social Connections: Moderately Integrated (06/27/2021)   Social Connection and Isolation Panel [NHANES]    Frequency of Communication with Friends and Family: More than three times a week    Frequency of Social Gatherings with Friends and Family: Once a week    Attends Religious Services: More than 4 times per year    Active Member of [employer] or Organizations: Yes    Attends Archivist Meetings: More than 4 times per year    Marital Status: Widowed    Tobacco Counseling Counseling given: Not Answered Tobacco comments: Smoker since 1975 has quit and started back several times!!,   Clinical Intake:  Pre-visit preparation completed: Yes  Pain : No/denies pain     Diabetes: No  How often do you need to have someone help you when you read instructions, pamphlets, or other written materials from your doctor or pharmacy?: 1 - Never What is the last grade level you completed in school?: Some college   Interpreter Needed?: No       Activities of Daily Living    09/17/2022    3:11 PM  In your present state of health, do you have any difficulty performing the following activities:  Hearing? 0  Vision? 0  Difficulty concentrating or making decisions? 0  Walking or climbing stairs? 0  Dressing or bathing? 0  Doing errands, shopping? 0    Patient Care Team: Lindell Spar, MD as PCP - General (Internal Medicine) Minus Breeding, MD as PCP - Cardiology (Cardiology) Rockwell Germany, RN as Oncology Nurse Navigator Mauro Kaufmann, RN as Oncology Nurse Navigator Jovita Kussmaul, MD as Consulting Physician (General Surgery) Truitt Merle, MD as Consulting Physician (Hematology) Eppie Gibson, MD as Attending Physician (Radiation Oncology) Edythe Clarity, [facility] as Pharmacist (Pharmacist)  Indicate any recent Medical Services you may have received from other than [facility] providers in the past year (date may be approximate).     Assessment:   This is a routine wellness examination for [place].  Hearing/Vision screen No results found.  Dietary issues and exercise activities discussed:     Goals Addressed   None    Depression Screen    09/17/2022    3:11 PM 06/06/2022    9:02 AM 04/16/2022   10:06 AM 03/05/2022    8:50 AM 01/08/2022   11:22 AM 10/23/2021    3:51 PM 08/30/2021    9:07 AM  PHQ 2/9 Scores  PHQ - 2 Score 0 0 0 0 0 0 0    Fall Risk    09/17/2022    3:11 PM 06/06/2022    9:02 AM 04/16/2022   10:06 AM 03/05/2022    8:50 AM 01/08/2022   11:22 AM  Fall Risk   Falls in the past year? 0 0 0 0 0  Number falls in past yr:  0 0 0 0  Injury with Fall?  0 0 0 0  Risk  for fall due to :   No Fall Risks No Fall Risks No Fall Risks  Follow up   Falls evaluation completed Falls evaluation completed Falls evaluation completed    [place]:  Any stairs in or around the home? Yes  If so, are there any without handrails? Yes  Home free of loose throw rugs in walkways, pet  beds, electrical cords, etc? Yes  Adequate lighting in your home to reduce risk of falls? Yes   ASSISTIVE DEVICES UTILIZED TO PREVENT FALLS:  Life alert? No  Use of a cane, walker or w/c? No  Grab bars in the bathroom? Yes  Shower chair or bench in shower? No  Elevated toilet seat or a handicapped toilet? No     Cognitive Function:        09/17/2022    3:12 PM 06/27/2021   11:31 AM  6CIT Screen  What Year? 0 points 0 points  What month? 0 points 0 points  What time? 0 points 0 points  Count back from 20 0 points 0 points  Months in reverse 0 points 0 points  Repeat phrase 0 points 0 points  Total Score 0 points 0 points    Immunizations Immunization History  Administered Date(s) Administered   Fluad Quad(high Dose 65+) 04/24/2021, 03/05/2022   Moderna Sars-Covid-2 Vaccination 09/08/2019, 10/14/2019, 05/11/2020   Pneumococcal Conjugate-13 05/27/2013   Pneumococcal Polysaccharide-23 06/02/2014   Pneumococcal-Unspecified 05/27/2013   Tdap 05/27/2013   Zoster Recombinat (Shingrix) 09/18/2021, 01/12/2022    TDAP status: Up to date  Flu Vaccine status: Up to date  Pneumococcal vaccine status: Up to date  Covid-19 vaccine status: Information provided on how to obtain vaccines.   Qualifies for Shingles Vaccine? Yes   Zostavax completed No   Shingrix Completed?: Yes  Screening Tests Health Maintenance  Topic Date Due   COVID-19 Vaccine (4 - 2023-24 season) 02/22/2022   Medicare Annual Wellness (AWV)  06/27/2022   DTaP/Tdap/Td (2 - Td or Tdap) 05/28/2023   Lung Cancer Screening  09/05/2023   COLONOSCOPY (Pts 45-53yrs Insurance coverage will need to be confirmed)  12/19/2031   Pneumonia Vaccine 11+ Years old  Completed   INFLUENZA VACCINE  Completed   DEXA SCAN  Completed   Zoster Vaccines- Shingrix  Completed   HPV VACCINES  Aged Out   Hepatitis C Screening  Discontinued    Health Maintenance  Health Maintenance Due  Topic Date Due   COVID-19 Vaccine (4 -  2023-24 season) 02/22/2022   Medicare Annual Wellness (AWV)  06/27/2022    Colorectal cancer screening: Type of screening: Colonoscopy. Completed 12/18/2021. Repeat every 3 years  History of Breast Cancer. She has screening completed yearly.   Bone Density status: Completed 01/04/2014. Results reflect: Bone density results: NORMAL.   Lung Cancer Screening: (Low Dose CT Chest recommended if Age 2-80 years, 30 pack-year currently smoking OR have quit w/in 15years.) does qualify. Completed.    Additional Screening:  Hepatitis C Screening: does not qualify  Vision Screening: Recommended annual ophthalmology exams for early detection of glaucoma and other disorders of the eye. Is the patient up to date with their annual eye exam?  Yes  Who is the provider or what is the name of the office in which the patient attends annual eye exams? A doctor in [place] If pt is not established with a provider, would they like to be referred to a provider to establish care? No .   Dental Screening: Recommended  annual dental exams for proper oral hygiene  Community Resource Referral / Chronic Care Management: CRR required this visit?  No   CCM required this visit?  No      Plan:     I have personally reviewed and noted the following in the patient's chart:   Medical and social history Use of alcohol, tobacco or illicit drugs  Current medications and supplements including opioid prescriptions. Patient is not currently taking opioid prescriptions. Functional ability and status Nutritional status Physical activity Advanced directives List of other physicians Hospitalizations, surgeries, and ER visits in previous 12 months Vitals Screenings to include cognitive, depression, and falls Referrals and appointments  In addition, I have reviewed and discussed with patient certain preventive protocols, quality metrics, and best practice recommendations. A written personalized care plan for  preventive services as well as general preventive health recommendations were provided to patient.     Adrienne Dy, MD   09/17/2022

## 2022-09-17 NOTE — Patient Instructions (Signed)
  Ms. Nienow , Thank you for taking time to come for your Medicare Wellness Visit. I appreciate your ongoing commitment to your health goals. Please review the following plan we discussed and let me know if I can assist you in the future.   These are the goals we discussed: Patient is doing chair exercised to regain her strength. She would like to be more active and lose some weight.   This is a list of the screening recommended for you and due dates:  Health Maintenance  Topic Date Due   COVID-19 Vaccine (4 - 2023-24 season) 02/22/2022   Medicare Annual Wellness Visit  06/27/2022   DTaP/Tdap/Td vaccine (2 - Td or Tdap) 05/28/2023   Screening for Lung Cancer  09/05/2023   Colon Cancer Screening  12/19/2031   Pneumonia Vaccine  Completed   Flu Shot  Completed   DEXA scan (bone density measurement)  Completed   Zoster (Shingles) Vaccine  Completed   HPV Vaccine  Aged Out   Hepatitis C Screening: USPSTF Recommendation to screen - Ages 80-79 yo.  Discontinued

## 2022-09-25 ENCOUNTER — Other Ambulatory Visit: Payer: Self-pay | Admitting: Internal Medicine

## 2022-09-25 DIAGNOSIS — E039 Hypothyroidism, unspecified: Secondary | ICD-10-CM

## 2022-09-27 DIAGNOSIS — H2511 Age-related nuclear cataract, right eye: Secondary | ICD-10-CM | POA: Diagnosis not present

## 2022-09-27 DIAGNOSIS — H2512 Age-related nuclear cataract, left eye: Secondary | ICD-10-CM | POA: Diagnosis not present

## 2022-10-03 ENCOUNTER — Encounter: Payer: Self-pay | Admitting: Family Medicine

## 2022-10-03 ENCOUNTER — Ambulatory Visit (INDEPENDENT_AMBULATORY_CARE_PROVIDER_SITE_OTHER): Payer: PPO | Admitting: Family Medicine

## 2022-10-03 VITALS — BP 118/76 | HR 64 | Ht 63.0 in | Wt 184.0 lb

## 2022-10-03 DIAGNOSIS — A084 Viral intestinal infection, unspecified: Secondary | ICD-10-CM | POA: Diagnosis not present

## 2022-10-03 DIAGNOSIS — R197 Diarrhea, unspecified: Secondary | ICD-10-CM

## 2022-10-03 DIAGNOSIS — R112 Nausea with vomiting, unspecified: Secondary | ICD-10-CM

## 2022-10-03 MED ORDER — ONDANSETRON HCL 4 MG PO TABS: 4.0000 mg | ORAL_TABLET | Freq: Three times a day (TID) | ORAL | 0 refills | Status: DC | PRN

## 2022-10-03 MED ORDER — LOPERAMIDE HCL 2 MG PO TABS: 2.0000 mg | ORAL_TABLET | Freq: Four times a day (QID) | ORAL | 0 refills | Status: DC | PRN

## 2022-10-03 NOTE — Patient Instructions (Signed)
It was pleasure meeting with you today. Please take medications as prescribed. Follow up with your primary health provider if any health concerns arises. If symptoms worsen please contact your primary care provider and/or visit the emergency department.  

## 2022-10-03 NOTE — Assessment & Plan Note (Signed)
Zofran 4 mg for nausea  Advise OTC loperamide and bismuth subsalicylate for diarrhea management Explained patient increase fluid intake 8 glasses of water a day to prevent dehydration. Avoid caffeine and alcohol. Add semisolid and low-fiber foods gradually as bowel movements return to normal. Follow a BRAT diet bananas, rice, applesauce, toast helping firm up stool. Follow-up in unable to keep food/fluid down x 24 hours, dizziness, fevers, worsening or persistent symptoms to present to ED or contact primary care provider. Patient verbalizes understanding regarding plan of care and all questions answered.

## 2022-10-03 NOTE — Progress Notes (Addendum)
Acute Office Visit  Subjective:    Patient ID: Adrienne [name], female    DOB: [birth year], 77 y.o.   MRN: [id number]  Chief Complaint  Patient presents with   Emesis    Patient complains of vomitting and diarrhea since yesterday. Has only been able to keep down ice chips and some gatorade.     Diarrhea  This is a new problem. The current episode started yesterday. The problem occurs 5 to 10 times per day. The problem has been gradually worsening. The stool consistency is described as Watery and mucous. The patient states that diarrhea does not awaken her from sleep. Associated symptoms include vomiting. Pertinent negatives include no abdominal pain, bloating, chills, coughing, fever, headaches, increased  flatus or myalgias. The symptoms are aggravated by dairy products. Risk factors include suspect food intake. She has tried electrolyte solution for the symptoms. The treatment provided mild relief. There is no history of bowel resection, inflammatory bowel disease, irritable bowel syndrome or a recent abdominal surgery.     Review of Systems  Constitutional:  Negative for chills and fever.  Eyes:  Negative for blurred vision and double vision.  Respiratory:  Negative for cough.   Cardiovascular:  Negative for chest pain.  Gastrointestinal:  Positive for diarrhea, nausea and vomiting. Negative for abdominal pain, bloating, blood in stool, constipation, flatus, heartburn and melena.  Genitourinary:  Negative for dysuria and urgency.  Musculoskeletal:  Negative for myalgias.  Skin:  Negative for rash.  Neurological:  Negative for dizziness and headaches.      Objective:    BP 118/76   Pulse 64   Ht 5\' 3"  (1.6 m)   Wt 184 lb (83.5 kg)   SpO2 94%   BMI 32.59 kg/m  BP Readings from Last 3 Encounters:  10/03/22 118/76  08/08/22 (!) 140/62  07/16/22 130/66      Physical Exam Vitals reviewed.  Constitutional:      General: She is not in acute distress.    Appearance:  Normal appearance. She is not ill-appearing, toxic-appearing or diaphoretic.  HENT:     Head: Normocephalic.  Eyes:     General:        Right eye: No discharge.        Left eye: No discharge.     Conjunctiva/sclera: Conjunctivae normal.  Cardiovascular:     Rate and Rhythm: Normal rate.     Pulses: Normal pulses.     Heart sounds: Normal heart sounds.  Pulmonary:     Effort: Pulmonary effort is normal. No respiratory distress.     Breath sounds: Normal breath sounds.  Abdominal:     General: Bowel sounds are normal. There is no distension.     Palpations: Abdomen is soft.     Tenderness: There is no abdominal tenderness. There is no guarding.  Musculoskeletal:        General: Normal range of motion.     Cervical back: Normal range of motion.  Skin:    General: Skin is warm and dry.     Capillary Refill: Capillary refill takes less than 2 seconds.  Neurological:     General: No focal deficit present.     Mental Status: She is alert and oriented to person, place, and time.     Coordination: Coordination normal.     Gait: Gait normal.  Psychiatric:        Mood and Affect: Mood normal.        Behavior: Behavior normal.  Thought Content: Thought content normal.        Judgment: Judgment normal.     No results found for any visits on 10/03/22.     Assessment & Plan:  Diarrhea, unspecified type -     Loperamide HCl; Take 1 tablet (2 mg total) by mouth 4 (four) times daily as needed for diarrhea or loose stools.  Dispense: 30 tablet; Refill: 0  Mild nausea and vomiting -     Ondansetron HCl; Take 1 tablet (4 mg total) by mouth every 8 (eight) hours as needed for nausea or vomiting.  Dispense: 20 tablet; Refill: 0  Viral gastroenteritis Assessment & Plan: Zofran 4 mg for nausea  Advise OTC loperamide and bismuth subsalicylate for diarrhea management Explained patient increase fluid intake 8 glasses of water a day to prevent dehydration. Avoid caffeine and alcohol. Add  semisolid and low-fiber foods gradually as bowel movements return to normal. Follow a BRAT diet bananas, rice, applesauce, toast helping firm up stool. Follow-up in unable to keep food/fluid down x 24 hours, dizziness, fevers, worsening or persistent symptoms to present to ED or contact primary care provider. Patient verbalizes understanding regarding plan of care and all questions answered.       No follow-ups on file.  Cruzita Lederer Newman Nip, FNP

## 2022-10-10 ENCOUNTER — Ambulatory Visit (INDEPENDENT_AMBULATORY_CARE_PROVIDER_SITE_OTHER): Payer: PPO | Admitting: Internal Medicine

## 2022-10-10 ENCOUNTER — Encounter: Payer: Self-pay | Admitting: Internal Medicine

## 2022-10-10 VITALS — BP 138/82 | HR 65 | Ht 63.0 in | Wt 188.6 lb

## 2022-10-10 DIAGNOSIS — J849 Interstitial pulmonary disease, unspecified: Secondary | ICD-10-CM

## 2022-10-10 DIAGNOSIS — E559 Vitamin D deficiency, unspecified: Secondary | ICD-10-CM

## 2022-10-10 DIAGNOSIS — J439 Emphysema, unspecified: Secondary | ICD-10-CM | POA: Diagnosis not present

## 2022-10-10 DIAGNOSIS — R7303 Prediabetes: Secondary | ICD-10-CM | POA: Diagnosis not present

## 2022-10-10 DIAGNOSIS — I428 Other cardiomyopathies: Secondary | ICD-10-CM

## 2022-10-10 DIAGNOSIS — Z853 Personal history of malignant neoplasm of breast: Secondary | ICD-10-CM

## 2022-10-10 DIAGNOSIS — I10 Essential (primary) hypertension: Secondary | ICD-10-CM

## 2022-10-10 DIAGNOSIS — E782 Mixed hyperlipidemia: Secondary | ICD-10-CM

## 2022-10-10 DIAGNOSIS — Z0001 Encounter for general adult medical examination with abnormal findings: Secondary | ICD-10-CM | POA: Diagnosis not present

## 2022-10-10 DIAGNOSIS — E039 Hypothyroidism, unspecified: Secondary | ICD-10-CM | POA: Diagnosis not present

## 2022-10-10 NOTE — Assessment & Plan Note (Signed)
On Losartan Stopped Coreg due to bradycardia Followed by Cardiology

## 2022-10-10 NOTE — Assessment & Plan Note (Signed)
Physical exam as documented. Fasting blood tests today. 

## 2022-10-10 NOTE — Assessment & Plan Note (Signed)
HRCT chest reviewed Currently asymptomatic Followed by pulmonology

## 2022-10-10 NOTE — Assessment & Plan Note (Signed)
Lab Results  Component Value Date   HGBA1C 6.2 (H) 01/08/2022   Advised to follow low carb diet 

## 2022-10-10 NOTE — Assessment & Plan Note (Signed)
DCIS of right breast, s/p mastectomy 

## 2022-10-10 NOTE — Assessment & Plan Note (Signed)
Noted on CT chest Currently asymptomatic

## 2022-10-10 NOTE — Patient Instructions (Signed)
Please continue to take medications as prescribed.  Please continue to follow low salt diet and perform moderate exercise/walking at least 150 mins/week. 

## 2022-10-10 NOTE — Assessment & Plan Note (Signed)
Last lipid profile reviewed Does not tolerate statins - has tried Crestor, pravastatin and atorvastatin Needs to follow low cholesterol diet Check lipid profile

## 2022-10-10 NOTE — Assessment & Plan Note (Addendum)
BP Readings from Last 1 Encounters:  10/10/22 138/82   Usually well-controlled with Losartan 100 mg QD and amlodipine 10 mg daily Elevated today as she has not had her meds yet Counseled for compliance with the medications Advised DASH diet and moderate exercise/walking, at least 150 mins/week

## 2022-10-10 NOTE — Progress Notes (Addendum)
Established Patient Office Visit  Subjective:  Patient ID: Adrienne [name], female    DOB: 01-30-1946  Age: 77 y.o. MRN: [id number]  CC:  Chief Complaint  Patient presents with   Annual Exam    HPI Adrienne [name] is a 77 y.o. female with past medical history of HTN, nonishcemic CM, hypothyroidism, OA of hip and knee, HLD, breast ca. s/p right mastectomy who presents for annual physical.  HTN: Her BP is elevated today as she has not taken her Losartan and Amlodipine today as she has been fasting for blood tests. She currently denies any headache, dizziness, chest pain or dyspnea.  She also has been taking NP thyroid 90 mg QD.  She denies any recent change in weight or appetite.  ILD: She had high resolution CT chest done recently, which showed signs of fibrosis, likely mild ILD.  She has had PFT done as well.  Followed by pulmonology.  She currently denies any dyspnea or wheezing.  She recently had episode of diarrhea and vomiting, which has resolved now.  She still reports mild abdominal discomfort, but denies any nausea, vomiting, diarrhea, melena or hematochezia.  Denies any fever or chills.    Past Medical History:  Diagnosis Date   Allergy    Ambulates with cane    straight cane   Arthritis    knee, back   CHF (congestive heart failure)    Ductal carcinoma in situ of breast 12/2018   R Breast-mastectomy only   Gout    History of blood transfusion 1986   w/ Hysterectomy surgery   Hyperlipidemia    diet controlled - no meds   Hypertension    Hypothyroidism    Pelvic kidney    lower right pelvic kidney    Prediabetes    diet controlled - no meds   SBO (small bowel obstruction) 10/2016   surgery    Sleep apnea    Mild - no mask needed per sleep study   Smoker    quit smoking 2018    Past Surgical History:  Procedure Laterality Date   ABDOMINAL HYSTERECTOMY  1986   COMPLETE-precancerous   APPENDECTOMY     pt states it was removed when  gallbladder was removed.   BACK SURGERY     lower back   BREAST BIOPSY Right 05/12/2019   times 2   BREAST LUMPECTOMY WITH RADIOACTIVE SEED LOCALIZATION Right 03/18/2019   Procedure: RIGHT BREAST LUMPECTOMY WITH RADIOACTIVE SEED LOCALIZATION;  Surgeon: Griselda Miner, MD;  Location: [facility];  Service: General;  Laterality: Right;   CHOLECYSTECTOMY     COLONOSCOPY  02/15/2008   Kaplan    COLONOSCOPY WITH PROPOFOL  02/27/2018   Dr.Nandigam   ECTOPIC PREGNANCY SURGERY     JOINT REPLACEMENT     Left hip total Dr. Lequita Halt 10-01-17   MASTECTOMY Right 07/26/2019   POLYPECTOMY     polypectomy-oropharynx     RE-EXCISION OF BREAST CANCER,SUPERIOR MARGINS Right 04/08/2019   Procedure: RE-EXCISION OF RIGHT BREAST ANTERIOR MARGINS;  Surgeon: Chevis Pretty III, MD;  Location: [facility];  Service: General;  Laterality: Right;   right knee meniscus     SBO  10/2016   small bowel obstruction   SIMPLE MASTECTOMY WITH AXILLARY SENTINEL NODE BIOPSY Right 07/26/2019   Procedure: RIGHT MASTECTOMY WITH SENTINEL NODE BIOPSY;  Surgeon: Griselda Miner, MD;  Location: [facility];  Service: General;  Laterality: Right;   TOTAL HIP ARTHROPLASTY Left 10/01/2017   Procedure:  LEFT  TOTAL HIP ARTHROPLASTY ANTERIOR APPROACH;  Surgeon: Ollen Gross, MD;  Location: [facility];  Service: Orthopedics;  Laterality: Left;   TOTAL HIP ARTHROPLASTY Right 03/11/2018   Procedure: RIGHT TOTAL HIP ARTHROPLASTY ANTERIOR APPROACH;  Surgeon: Ollen Gross, MD;  Location: [facility];  Service: Orthopedics;  Laterality: Right;    UPPER GASTROINTESTINAL ENDOSCOPY      Family History  Problem Relation Age of Onset   Cancer Sister    Diabetes Brother    Diabetes Brother    Hypertension Other    Breast cancer Sister    Colon cancer Neg Hx    Colon polyps Neg Hx    Rectal cancer Neg Hx    Stomach cancer Neg Hx     Social History   Socioeconomic History   Marital status: Widowed    Spouse name: Not on file   Number  of children: 3   Years of education: 28   Highest education level: Some college, no degree  Occupational History   Occupation: retired  Tobacco Use   Smoking status: Former    Packs/day: 1.00    Years: 32.00    Additional pack years: 0.00    Total pack years: 32.00    Types: E-cigarettes, Cigarettes    Quit date: 11/20/2016    Years since quitting: 5.8   Smokeless tobacco: Never   Tobacco comments:    Smoker since 1975 has quit and started back several times!!,  Vaping Use   Vaping Use: Former  Substance and Sexual Activity   Alcohol use: No   Drug use: No   Sexual activity: Not Currently    Birth control/protection: Surgical    Comment: Hysterectomy  Other Topics Concern   Not on file  Social History Narrative   Widow since 2006.Married previously for 25 years.Lives with grandson and 2 great grandchildren.Retired.Previously office work.   Social Determinants of Health   Financial Resource Strain: Low Risk  (06/27/2021)   Overall Financial Resource Strain (CARDIA)    Difficulty of Paying Living Expenses: Not hard at all  Food Insecurity: No Food Insecurity (06/27/2021)   Hunger Vital Sign    Worried About Running Out of Food in the Last Year: Never true    Ran Out of Food in the Last Year: Never true  Transportation Needs: No Transportation Needs (06/27/2021)   PRAPARE - Administrator, Civil Service (Medical): No    Lack of Transportation (Non-Medical): No  Physical Activity: Inactive (06/27/2021)   Exercise Vital Sign    Days of Exercise per Week: 0 days    Minutes of Exercise per Session: 0 min  Stress: No Stress Concern Present (06/27/2021)   [employer] of Occupational Health - Occupational Stress Questionnaire    Feeling of Stress : Not at all  Social Connections: Moderately Integrated (06/27/2021)   Social Connection and Isolation Panel [NHANES]    Frequency of Communication with Friends and Family: More than three times a week    Frequency of Social  Gatherings with Friends and Family: Once a week    Attends Religious Services: More than 4 times per year    Active Member of [employer] or Organizations: Yes    Attends Banker Meetings: More than 4 times per year    Marital Status: Widowed  Intimate Partner Violence: Not At Risk (06/27/2021)   Humiliation, Afraid, Rape, and Kick questionnaire    Fear of Current or Ex-Partner: No    Emotionally Abused: No  Physically Abused: No    Sexually Abused: No    Outpatient Medications Prior to Visit  Medication Sig Dispense Refill   amLODipine (NORVASC) 10 MG tablet Take 1 tablet (10 mg total) by mouth daily. 90 tablet 1   celecoxib (CELEBREX) 100 MG capsule TAKE 1 CAPSULE BY MOUTH TWICE DAILY AS NEEDED FOR MILD TO MODERATE PAIN 60 capsule 0   Cholecalciferol (VITAMIN D-3) 125 MCG (5000 UT) TABS Take 2 tablets by mouth daily.     loperamide (IMODIUM A-D) 2 MG tablet Take 1 tablet (2 mg total) by mouth 4 (four) times daily as needed for diarrhea or loose stools. 30 tablet 0   losartan (COZAAR) 100 MG tablet Take 1 tablet (100 mg total) by mouth daily. 90 tablet 3   Misc. Devices MISC Blood pressure cuff - 1 each. ICD10: I10. 1 each 0   NP THYROID 90 MG tablet Take 1 tablet by mouth once daily 30 tablet 0   ondansetron (ZOFRAN) 4 MG tablet Take 1 tablet (4 mg total) by mouth every 8 (eight) hours as needed for nausea or vomiting. 20 tablet 0   No facility-administered medications prior to visit.    Allergies  Allergen Reactions   Lisinopril Swelling    Angioedema Angioedema Angioedema   Statins     Myalgias   Atorvastatin Other (See Comments)    Muscle aches   Muscle aches  Muscle aches     ROS Review of Systems  Constitutional:  Negative for chills and fever.  HENT:  Negative for congestion, sinus pressure, sinus pain and sore throat.   Eyes:  Negative for pain and discharge.  Respiratory:  Negative for cough and shortness of breath.   Cardiovascular:  Negative for  chest pain and palpitations.  Gastrointestinal:  Negative for abdominal pain, constipation, diarrhea, nausea and vomiting.  Endocrine: Negative for polydipsia and polyuria.  Genitourinary:  Negative for dysuria and hematuria.  Musculoskeletal:  Positive for arthralgias. Negative for neck pain and neck stiffness.  Skin:  Negative for rash.  Neurological:  Negative for dizziness and weakness.  Psychiatric/Behavioral:  Negative for agitation and behavioral problems.       Objective:    Physical Exam Vitals reviewed.  Constitutional:      General: She is not in acute distress.    Appearance: She is obese. She is not diaphoretic.  HENT:     Head: Normocephalic and atraumatic.     Nose: Nose normal.     Mouth/Throat:     Mouth: Mucous membranes are moist.  Eyes:     General: No scleral icterus.    Extraocular Movements: Extraocular movements intact.  Cardiovascular:     Rate and Rhythm: Normal rate and regular rhythm.     Pulses: Normal pulses.     Heart sounds: Normal heart sounds. No murmur heard. Pulmonary:     Breath sounds: Normal breath sounds. No wheezing or rales.  Abdominal:     Palpations: Abdomen is soft.     Tenderness: There is no abdominal tenderness.  Musculoskeletal:     Cervical back: Neck supple. No tenderness.     Right lower leg: No edema.     Left lower leg: No edema.  Skin:    General: Skin is warm.     Findings: No rash.  Neurological:     General: No focal deficit present.     Mental Status: She is alert and oriented to person, place, and time.     Cranial Nerves:  No cranial nerve deficit.     Sensory: No sensory deficit.     Motor: No weakness.  Psychiatric:        Mood and Affect: Mood normal.        Behavior: Behavior normal.     BP 138/82 (BP Location: Left Arm)   Pulse 65   Ht  (1.6 m)   Wt 188 lb 9.6 oz (85.5 kg)   SpO2 95%   BMI 33.41 kg/m  Wt Readings from Last 3 Encounters:  10/10/22 188 lb 9.6 oz (85.5 kg)  10/03/22 184  lb (83.5 kg)  08/08/22 192 lb 12.8 oz (87.5 kg)    Lab Results  Component Value Date   TSH 1.350 06/06/2022   Lab Results  Component Value Date   WBC 8.7 04/24/2021   HGB 13.1 04/24/2021   HCT 40.7 04/24/2021   MCV 90 04/24/2021   PLT 283 04/24/2021   Lab Results  Component Value Date   NA 144 06/06/2022   K 4.4 06/06/2022   CO2 22 06/06/2022   GLUCOSE 100 (H) 06/06/2022   BUN 13 06/06/2022   CREATININE 0.92 06/06/2022   BILITOT 0.4 03/05/2022   ALKPHOS 89 03/05/2022   AST 29 03/05/2022   ALT 24 03/05/2022   PROT 6.7 03/05/2022   ALBUMIN 4.0 03/05/2022   CALCIUM 9.3 06/06/2022   ANIONGAP 12 07/22/2019   EGFR 65 06/06/2022   GFR 74.79 07/15/2013   Lab Results  Component Value Date   CHOL 258 (H) 01/08/2022   Lab Results  Component Value Date   HDL 62 01/08/2022   Lab Results  Component Value Date   LDLCALC 172 (H) 01/08/2022   Lab Results  Component Value Date   TRIG 136 01/08/2022   Lab Results  Component Value Date   CHOLHDL 4.2 01/08/2022   Lab Results  Component Value Date   HGBA1C 6.2 (H) 01/08/2022      Assessment & Plan:   Problem List Items Addressed This Visit    Encounter for general adult medical examination with abnormal findings Physical exam as documented. Fasting blood tests today.   Hypertension BP Readings from Last 1 Encounters:  10/10/22 138/82   Usually well-controlled with Losartan 100 mg QD and amlodipine 10 mg daily Elevated today as she has not had her meds yet Counseled for compliance with the medications Advised DASH diet and moderate exercise/walking, at least 150 mins/week  Nonischemic cardiomyopathy (HCC) On Losartan Stopped Coreg due to bradycardia Followed by Cardiology  ILD (interstitial lung disease) (HCC) HRCT chest reviewed Currently asymptomatic Followed by pulmonology  Hypothyroidism Lab Results  Component Value Date   TSH 1.350 06/06/2022   On NP thyroid 90 mg QD, had decreased dose in  the past due to oversupplementation Discussed about switching to Levothyroxine, she would prefer to stay on NP thyroid for now Check TSH and free T4  HLD (hyperlipidemia) Last lipid profile reviewed Does not tolerate statins - has tried Crestor, pravastatin and atorvastatin Needs to follow low cholesterol diet Check lipid profile  Prediabetes Lab Results  Component Value Date   HGBA1C 6.2 (H) 01/08/2022   Advised to follow low carb diet  History of breast cancer DCIS of right breast, s/p mastectomy  Pulmonary emphysema Noted on CT chest Currently asymptomatic  No orders of the defined types were placed in this encounter.   Follow-up: Return in about 4 months (around 02/09/2023) for HTN and hypothyroidism.    Anabel Halon, MD

## 2022-10-10 NOTE — Assessment & Plan Note (Signed)
Lab Results  Component Value Date   TSH 1.350 06/06/2022   On NP thyroid 90 mg QD, had decreased dose in the past due to oversupplementation Discussed about switching to Levothyroxine, she would prefer to stay on NP thyroid for now Check TSH and free T4

## 2022-10-11 ENCOUNTER — Other Ambulatory Visit: Payer: Self-pay | Admitting: Internal Medicine

## 2022-10-11 DIAGNOSIS — E119 Type 2 diabetes mellitus without complications: Secondary | ICD-10-CM | POA: Insufficient documentation

## 2022-10-11 DIAGNOSIS — E1169 Type 2 diabetes mellitus with other specified complication: Secondary | ICD-10-CM | POA: Insufficient documentation

## 2022-10-11 LAB — VITAMIN D 25 HYDROXY (VIT D DEFICIENCY, FRACTURES): Vit D, 25-Hydroxy: 74.4 ng/mL (ref 30.0–100.0)

## 2022-10-11 LAB — CMP14+EGFR
ALT: 41 IU/L — ABNORMAL HIGH (ref 0–32)
AST: 46 IU/L — ABNORMAL HIGH (ref 0–40)
Albumin/Globulin Ratio: 1.4 (ref 1.2–2.2)
Albumin: 4.1 g/dL (ref 3.8–4.8)
Alkaline Phosphatase: 93 IU/L (ref 44–121)
BUN/Creatinine Ratio: 10 — ABNORMAL LOW (ref 12–28)
BUN: 10 mg/dL (ref 8–27)
Bilirubin Total: 0.5 mg/dL (ref 0.0–1.2)
CO2: 25 mmol/L (ref 20–29)
Calcium: 9.8 mg/dL (ref 8.7–10.3)
Chloride: 106 mmol/L (ref 96–106)
Creatinine, Ser: 0.96 mg/dL (ref 0.57–1.00)
Globulin, Total: 3 g/dL (ref 1.5–4.5)
Glucose: 106 mg/dL — ABNORMAL HIGH (ref 70–99)
Potassium: 4.3 mmol/L (ref 3.5–5.2)
Sodium: 146 mmol/L — ABNORMAL HIGH (ref 134–144)
Total Protein: 7.1 g/dL (ref 6.0–8.5)
eGFR: 62 mL/min/{1.73_m2} (ref >59)

## 2022-10-11 LAB — CBC WITH DIFFERENTIAL/PLATELET
Basophils Absolute: 0 10*3/uL (ref 0.0–0.2)
Basos: 0 %
EOS (ABSOLUTE): 0.1 10*3/uL (ref 0.0–0.4)
Eos: 1 %
Hematocrit: 38.4 % (ref 34.0–46.6)
Hemoglobin: 12.5 g/dL (ref 11.1–15.9)
Immature Grans (Abs): 0 10*3/uL (ref 0.0–0.1)
Immature Granulocytes: 0 %
Lymphocytes Absolute: 2.8 10*3/uL (ref 0.7–3.1)
Lymphs: 29 %
MCH: 29.7 pg (ref 26.6–33.0)
MCHC: 32.6 g/dL (ref 31.5–35.7)
MCV: 91 fL (ref 79–97)
Monocytes Absolute: 0.4 10*3/uL (ref 0.1–0.9)
Monocytes: 4 %
Neutrophils Absolute: 6.3 10*3/uL (ref 1.4–7.0)
Neutrophils: 66 %
Platelets: 242 10*3/uL (ref 150–450)
RBC: 4.21 x10E6/uL (ref 3.77–5.28)
RDW: 13.4 % (ref 11.7–15.4)
WBC: 9.7 10*3/uL (ref 3.4–10.8)

## 2022-10-11 LAB — HEMOGLOBIN A1C
Est. average glucose Bld gHb Est-mCnc: 146 mg/dL
Hgb A1c MFr Bld: 6.7 % — ABNORMAL HIGH (ref 4.8–5.6)

## 2022-10-11 LAB — LIPID PANEL
Chol/HDL Ratio: 4.1 ratio (ref 0.0–4.4)
Cholesterol, Total: 219 mg/dL — ABNORMAL HIGH (ref 100–199)
HDL: 53 mg/dL (ref >39)
LDL Chol Calc (NIH): 145 mg/dL — ABNORMAL HIGH (ref 0–99)
Triglycerides: 118 mg/dL (ref 0–149)
VLDL Cholesterol Cal: 21 mg/dL (ref 5–40)

## 2022-10-11 LAB — TSH+FREE T4
Free T4: 0.77 ng/dL — ABNORMAL LOW (ref 0.82–1.77)
TSH: 2.13 u[IU]/mL (ref 0.450–4.500)

## 2022-10-11 MED ORDER — METFORMIN HCL ER 500 MG PO TB24
500.0000 mg | ORAL_TABLET | Freq: Every day | ORAL | 5 refills | Status: DC
Start: 2022-10-11 — End: 2023-02-14

## 2022-10-12 ENCOUNTER — Other Ambulatory Visit: Payer: Self-pay | Admitting: Internal Medicine

## 2022-10-12 DIAGNOSIS — I10 Essential (primary) hypertension: Secondary | ICD-10-CM

## 2022-10-16 LAB — ANA+ENA+DNA/DS+SCL 70+SJOSSA/B
ANA Titer 1: NEGATIVE
ENA RNP Ab: <0.2 AI (ref 0.0–0.9)
ENA SM Ab Ser-aCnc: <0.2 AI (ref 0.0–0.9)
ENA SSA (RO) Ab: <0.2 AI (ref 0.0–0.9)
ENA SSB (LA) Ab: <0.2 AI (ref 0.0–0.9)
Scleroderma (Scl-70) (ENA) Antibody, IgG: <0.2 AI (ref 0.0–0.9)
dsDNA Ab: <1 IU/mL (ref 0–9)

## 2022-10-16 LAB — CYCLIC CITRUL PEPTIDE ANTIBODY, IGG/IGA: Cyclic Citrullin Peptide Ab: 7 units (ref 0–19)

## 2022-10-16 LAB — CK: Total CK: 112 U/L (ref 32–182)

## 2022-10-17 ENCOUNTER — Telehealth: Payer: Self-pay | Admitting: *Deleted

## 2022-10-17 NOTE — Telephone Encounter (Signed)
   Pre-operative Risk Assessment    Patient Name: Adrienne [name]  DOB: 1946-01-15 MRN: [id number]      Request for Surgical Clearance                 Procedure:   CATARACT EXTRACTION W/INTRAOCULAR LENS IMPLANTATION OF THE LEFT EYE , FOLLOWED BY THE RIGHT EYE  Date of Surgery:  Clearance -FORM IS DATED 09/27/22-2ND ATTEMPT                            Surgeon:   Surgeon's Group or Practice Name:  [facility] Phone number:  [phone] Fax number:  [phone]   Type of Clearance Requested:   - Medical    Type of Anesthesia:   TOPICAL ANESTHESIA W/IV MEDICATION   Additional requests/questions:  NONE  Tyson Alias   10/17/2022, 8:49 AM

## 2022-10-17 NOTE — Telephone Encounter (Signed)
   Patient Name: Adrienne [name]  DOB: April 07, 1946 MRN: [id number]  Primary Cardiologist: Rollene Rotunda, MD  Chart reviewed as part of pre-operative protocol coverage. Cataract extractions are recognized in guidelines as low risk surgeries that do not typically require specific preoperative testing or holding of blood thinner therapy. Therefore, given past medical history and time since last visit, based on ACC/AHA guidelines, Adrienne [name] would be at acceptable risk for the planned procedure without further cardiovascular testing.   I will route this recommendation to the requesting party via Epic fax function and remove from pre-op pool.  Please call with questions.  Joylene Grapes, NP 10/17/2022, 9:17 AM

## 2022-10-25 DIAGNOSIS — H2511 Age-related nuclear cataract, right eye: Secondary | ICD-10-CM | POA: Diagnosis not present

## 2022-10-28 ENCOUNTER — Other Ambulatory Visit: Payer: Self-pay | Admitting: Internal Medicine

## 2022-10-28 DIAGNOSIS — E039 Hypothyroidism, unspecified: Secondary | ICD-10-CM

## 2022-11-05 ENCOUNTER — Ambulatory Visit: Payer: PPO | Admitting: Cardiology

## 2022-11-06 DIAGNOSIS — I341 Nonrheumatic mitral (valve) prolapse: Secondary | ICD-10-CM | POA: Insufficient documentation

## 2022-11-06 DIAGNOSIS — R001 Bradycardia, unspecified: Secondary | ICD-10-CM | POA: Insufficient documentation

## 2022-11-06 NOTE — Progress Notes (Unsigned)
  Cardiology Office Note:   Date:  11/07/2022  ID:  Arthella, Vernet 1946-03-26, MRN [id number]  History of Present Illness:   Adrienne [name] is a 77 y.o. female who presents for follow up of CHF.  She had an EF of 44% in 2010.   EF was 55% in 2015.  Since I last saw her she has done well.  She did have some leg pain and I sent her and she was found to have ABIs of 0.76 on the right 10.73 on the left.  She did see Dr. Allyson Sabal who recommended no further cardiovascular testing but just continued risk management.   Echo in March demonstrated an EF of 45 - 50%.    Since I saw her she has had no new cardiovascular complaints.  She is limited by knee pain so she does not exercise as much as we would like.  She denies any cardiovascular complaints such as PND or orthopnea.  She really does not think her breathing is significantly limiting her.  She is has a slow heart rate but she does not have any presyncope or syncope.  She has no new weight gain or edema but her symptoms been chronic.  ROS: As stated in the HPI and negative for all other systems.  Studies Reviewed:    EKG:  NA   Risk Assessment/Calculations:              Physical Exam:   VS:  BP 110/62 (BP Location: Left Arm, Patient Position: Sitting, Cuff Size: Normal)   Pulse (!) 55   Ht 5\' 3"  (1.6 m)   Wt 191 lb 6.4 oz (86.8 kg)   SpO2 99%   BMI 33.90 kg/m    Wt Readings from Last 3 Encounters:  11/07/22 191 lb 6.4 oz (86.8 kg)  10/10/22 188 lb 9.6 oz (85.5 kg)  10/03/22 184 lb (83.5 kg)     GEN: Well nourished, well developed in no acute distress NECK: No JVD; No carotid bruits CARDIAC: RRR, no murmurs, rubs, gallops RESPIRATORY:  Clear to auscultation without rales, wheezing or rhonchi  ABDOMEN: Soft, non-tender, non-distended EXTREMITIES: Mild left greater than right leg edema; No deformity   ASSESSMENT AND PLAN:   History of Cardiomyopathy : Her blood pressure will not allow med titration.  She will remain  on the meds as listed.  I am going to check a PYP scan.  Mild Mitral Valve Prolapse   There was mild MR on recent echo.  I will follow this clinically and with repeat echoes in the future.  Bradycardia : She wore a monitor earlier this year and her average heart rate was 49 but she has no symptoms related to this.  There were no high degree bradycardic events.  No change in therapy.  This precludes further med titration that might affect her heart rate.  HTN : Her blood pressure is being managed in the context of treating her cardiomyopathy.   HLD : She has been intolerant to statins.  She has coronary calcium negative perfusion study in the past.  She does need an LDL at least in the 70s if not lower.  I am going to refer her to our Pharm.D. clinic to begin PCSK9.       Signed, Rollene Rotunda, MD

## 2022-11-07 ENCOUNTER — Ambulatory Visit: Payer: PPO | Admitting: Pulmonary Disease

## 2022-11-07 ENCOUNTER — Ambulatory Visit: Payer: PPO | Attending: Cardiology | Admitting: Cardiology

## 2022-11-07 ENCOUNTER — Encounter: Payer: Self-pay | Admitting: Cardiology

## 2022-11-07 VITALS — BP 110/62 | HR 55 | Ht 63.0 in | Wt 191.4 lb

## 2022-11-07 DIAGNOSIS — I10 Essential (primary) hypertension: Secondary | ICD-10-CM

## 2022-11-07 DIAGNOSIS — I5022 Chronic systolic (congestive) heart failure: Secondary | ICD-10-CM

## 2022-11-07 DIAGNOSIS — R001 Bradycardia, unspecified: Secondary | ICD-10-CM | POA: Diagnosis not present

## 2022-11-07 DIAGNOSIS — I341 Nonrheumatic mitral (valve) prolapse: Secondary | ICD-10-CM

## 2022-11-07 DIAGNOSIS — E785 Hyperlipidemia, unspecified: Secondary | ICD-10-CM

## 2022-11-07 NOTE — Patient Instructions (Addendum)
Medication Instructions:  Your physician recommends that you continue on your current medications as directed. Please refer to the Current Medication list given to you today.  *If you need a refill on your cardiac medications before your next appointment, please call your pharmacy*  Testing/Procedures: PYP scan at the [facility] office  Follow-Up: At [facility], you and your health needs are our priority.  As part of our continuing mission to provide you with exceptional heart care, we have created designated Provider Care Teams.  These Care Teams include your primary Cardiologist (physician) and Advanced Practice Providers (APPs -  Physician Assistants and Nurse Practitioners) who all work together to provide you with the care you need, when you need it.  We recommend signing up for the patient portal called "MyChart".  Sign up information is provided on this After Visit Summary.  MyChart is used to connect with patients for Virtual Visits (Telemedicine).  Patients are able to view lab/test results, encounter notes, upcoming appointments, etc.  Non-urgent messages can be sent to your provider as well.   To learn more about what you can do with MyChart, go to [email or web address].    Your next appointment:   12 months with Dr. Antoine Poche  Other Instructions You have been referred to: pharmacist lipid clinic

## 2022-11-29 ENCOUNTER — Other Ambulatory Visit: Payer: Self-pay | Admitting: Internal Medicine

## 2022-11-29 DIAGNOSIS — M1712 Unilateral primary osteoarthritis, left knee: Secondary | ICD-10-CM

## 2022-11-29 DIAGNOSIS — E039 Hypothyroidism, unspecified: Secondary | ICD-10-CM

## 2022-12-12 ENCOUNTER — Ambulatory Visit: Payer: PPO | Attending: Cardiology | Admitting: Pharmacist Clinician (PhC)/ Clinical Pharmacy Specialist

## 2022-12-12 ENCOUNTER — Encounter: Payer: Self-pay | Admitting: Pharmacist Clinician (PhC)/ Clinical Pharmacy Specialist

## 2022-12-12 ENCOUNTER — Telehealth (HOSPITAL_COMMUNITY): Payer: Self-pay | Admitting: *Deleted

## 2022-12-12 DIAGNOSIS — E782 Mixed hyperlipidemia: Secondary | ICD-10-CM

## 2022-12-12 NOTE — Telephone Encounter (Signed)
Left instructions for Amyloid study on home vm.

## 2022-12-12 NOTE — Patient Instructions (Signed)
Your Results:             Your most recent labs Goal  Total Cholesterol 219 < 200  Triglycerides 118 < 150  HDL (happy/good cholesterol) 53 > 40  LDL (lousy/bad cholesterol 145 < 70   Medication changes:  We will start the process to get Leqvio covered by your insurance.  Once the prior authorization is complete, I will call/send a MyChart message to let you know.  You will then hear from the Infusion center on [facility] to start injections  Lab orders:  We want to repeat labs about a month after the second injection.  We will send you a lab order to remind you once we get closer to that time.      Thank you for choosing [facility]

## 2022-12-12 NOTE — Progress Notes (Signed)
Office Visit    Patient Name: Adrienne [name] Date of Encounter: 12/12/2022  Primary Care Provider:  Anabel Halon, MD Primary Cardiologist:  Rollene Rotunda, MD  Chief Complaint    Hyperlipidemia   Significant Past Medical History   CAD 3 vessel coronary atherosclerosis, aortic atherosclerosis  PAD ABI's 0.76/0.73  CHF  EF at 45-50% on losartan  DM2 4/24 A1c 6.7 -took metformin x 1 month, states waiting to find out if she should continue  hypothyroid 4/24 TSH 2.13 on NP thyroid 90 mg     Allergies  Allergen Reactions   Lisinopril Swelling    Angioedema Angioedema Angioedema   Statins     Myalgias   Atorvastatin Other (See Comments)    Muscle aches   Muscle aches  Muscle aches     History of Present Illness    Adrienne [name] is a 77 y.o. female patient of Dr Antoine Poche, in the office today to discuss options for cholesterol management.    Insurance Carrier:  HTA  LDL Cholesterol goal:  LDL < 55  (newly diagnosed diabetes)  Current Medications:   none  Previously tried:  rosuvastatin, atorvastatin, pravastatin - myalgias  Family Hx:   brother died MI at 4, sister had CVA still living;  3 children healthy  Social Hx: Tobacco: no Alcohol: no    Diet:  likes salads; doesn't have time to cook much, so eats lots of cereal or "heat and eat" foods; (stays with her sister during the day MWF); doesn't snack much   Exercise: none recently  Adherence Assessment  Do you ever forget to take your medication? [] Yes [x] No  Do you ever skip doses due to side effects? [] Yes [x] No  Do you have trouble affording your medicines? [x] Yes - thyroid $38 [] No  Are you ever unable to pick up your medication due to transportation difficulties? [] Yes [x] No   Adherence strategy: 7 day minder   Accessory Clinical Findings   Lab Results  Component Value Date   CHOL 219 (H) 10/10/2022   HDL 53 10/10/2022   LDLCALC 145 (H) 10/10/2022   TRIG 118 10/10/2022    CHOLHDL 4.1 10/10/2022    No results found for: "LIPOA"  Lab Results  Component Value Date   ALT 41 (H) 10/10/2022   AST 46 (H) 10/10/2022   ALKPHOS 93 10/10/2022   BILITOT 0.5 10/10/2022   Lab Results  Component Value Date   CREATININE 0.96 10/10/2022   BUN 10 10/10/2022   NA 146 (H) 10/10/2022   K 4.3 10/10/2022   CL 106 10/10/2022   CO2 25 10/10/2022   Lab Results  Component Value Date   HGBA1C 6.7 (H) 10/10/2022    Home Medications    Current Outpatient Medications  Medication Sig Dispense Refill   amLODipine (NORVASC) 10 MG tablet Take 1 tablet by mouth once daily 90 tablet 0   celecoxib (CELEBREX) 100 MG capsule TAKE 1 CAPSULE BY MOUTH TWICE DAILY AS NEEDED FOR MILD TO MODERATE PAIN 60 capsule 0   Cholecalciferol (VITAMIN D-3) 125 MCG (5000 UT) TABS Take 2 tablets by mouth daily.     losartan (COZAAR) 100 MG tablet Take 1 tablet (100 mg total) by mouth daily. 90 tablet 3   metFORMIN (GLUCOPHAGE-XR) 500 MG 24 hr tablet Take 1 tablet (500 mg total) by mouth daily with breakfast. (Patient not taking: Reported on 12/12/2022) 30 tablet 5   NP THYROID 90 MG tablet Take 1 tablet by mouth once daily 30  tablet 0   No current facility-administered medications for this visit.     Assessment & Plan    HLD (hyperlipidemia) Assessment: Patient with ASCVD not at LDL goal of < 55 Most recent LDL 145 on 10/10/22 Not able to tolerate statins secondary to myalgias - pravastatin, rosuvastatin, atorvastatin Reviewed options for lowering LDL cholesterol, including ezetimibe, PCSK-9 inhibitors, bempedoic acid and inclisiran.  Discussed mechanisms of action, dosing, side effects, potential decreases in LDL cholesterol and costs.  Also reviewed potential options for patient assistance.  Plan: Patient agreeable to starting Leqvio Repeat labs after:  1 month after second dose Lipid Liver function States does not meet income requirements for [facility]   Phillips Hay,  PharmD CPP [facility] [address]  [place], [place] [address] [phone]  12/12/2022, 3:27 PM

## 2022-12-12 NOTE — Assessment & Plan Note (Signed)
Assessment: Patient with ASCVD not at LDL goal of < 55 Most recent LDL 145 on 10/10/22 Not able to tolerate statins secondary to myalgias - pravastatin, rosuvastatin, atorvastatin Reviewed options for lowering LDL cholesterol, including ezetimibe, PCSK-9 inhibitors, bempedoic acid and inclisiran.  Discussed mechanisms of action, dosing, side effects, potential decreases in LDL cholesterol and costs.  Also reviewed potential options for patient assistance.  Plan: Patient agreeable to starting Leqvio Repeat labs after:  1 month after second dose Lipid Liver function States does not meet income requirements for [employer]

## 2022-12-19 ENCOUNTER — Ambulatory Visit (HOSPITAL_COMMUNITY): Payer: PPO | Attending: Cardiology

## 2022-12-19 DIAGNOSIS — I5022 Chronic systolic (congestive) heart failure: Secondary | ICD-10-CM

## 2022-12-19 LAB — MYOCARDIAL AMYLOID PLANAR & SPECT: H/CL Ratio: 1.55

## 2022-12-19 MED ORDER — TECHNETIUM TC 99M PYROPHOSPHATE
20.7000 | Freq: Once | INTRAVENOUS | Status: AC
Start: 2022-12-19 — End: 2022-12-19
  Administered 2022-12-19: 20.7 via INTRAVENOUS

## 2022-12-23 ENCOUNTER — Telehealth: Payer: Self-pay | Admitting: *Deleted

## 2022-12-23 DIAGNOSIS — I428 Other cardiomyopathies: Secondary | ICD-10-CM

## 2022-12-23 NOTE — Telephone Encounter (Signed)
-----   Message from Rollene Rotunda, MD sent at 12/22/2022  5:40 PM EDT ----- This study was equivocal for amyloid.  Needs cardiac MRI.  Call Ms. Zinger with the results and send results to Anabel Halon, MD

## 2022-12-23 NOTE — Telephone Encounter (Signed)
Spoke with pt, aware of dr hochrein's recommendations Order placed

## 2022-12-30 ENCOUNTER — Other Ambulatory Visit: Payer: Self-pay | Admitting: Internal Medicine

## 2022-12-30 DIAGNOSIS — E039 Hypothyroidism, unspecified: Secondary | ICD-10-CM

## 2022-12-31 ENCOUNTER — Other Ambulatory Visit: Payer: Self-pay | Admitting: Internal Medicine

## 2022-12-31 DIAGNOSIS — E039 Hypothyroidism, unspecified: Secondary | ICD-10-CM

## 2023-01-03 ENCOUNTER — Other Ambulatory Visit: Payer: Self-pay | Admitting: Pharmacist Clinician (PhC)/ Clinical Pharmacy Specialist

## 2023-01-07 ENCOUNTER — Other Ambulatory Visit: Payer: Self-pay | Admitting: Internal Medicine

## 2023-01-07 ENCOUNTER — Telehealth: Payer: Self-pay | Admitting: Pharmacy Technician

## 2023-01-07 DIAGNOSIS — I10 Essential (primary) hypertension: Secondary | ICD-10-CM

## 2023-01-07 NOTE — Telephone Encounter (Signed)
Adrienne [name] note:  Patient will be scheduled as soon as possible.  Auth Submission: APPROVED Site of care: Site of care: [facility] Payer: [employer] ADVT Medication & CPT/J Code(s) submitted: Leqvio (Inclisiran) [id number] Route of submission (phone, fax, portal):  Phone #[phone] Fax # Auth type: Buy/Bill Units/visits requested: X2 Reference number: [id number] Approval from: 01/06/23 to 04/06/23

## 2023-01-09 ENCOUNTER — Ambulatory Visit: Payer: PPO | Admitting: Pulmonary Disease

## 2023-01-29 ENCOUNTER — Other Ambulatory Visit: Payer: Self-pay | Admitting: Internal Medicine

## 2023-01-29 DIAGNOSIS — I428 Other cardiomyopathies: Secondary | ICD-10-CM

## 2023-01-29 DIAGNOSIS — I10 Essential (primary) hypertension: Secondary | ICD-10-CM

## 2023-01-29 DIAGNOSIS — E039 Hypothyroidism, unspecified: Secondary | ICD-10-CM

## 2023-02-03 ENCOUNTER — Other Ambulatory Visit: Payer: Self-pay | Admitting: Internal Medicine

## 2023-02-03 DIAGNOSIS — M1712 Unilateral primary osteoarthritis, left knee: Secondary | ICD-10-CM

## 2023-02-04 ENCOUNTER — Ambulatory Visit: Payer: PPO

## 2023-02-06 ENCOUNTER — Other Ambulatory Visit: Payer: Self-pay | Admitting: General Surgery

## 2023-02-06 DIAGNOSIS — Z1231 Encounter for screening mammogram for malignant neoplasm of breast: Secondary | ICD-10-CM

## 2023-02-11 ENCOUNTER — Telehealth: Payer: Self-pay

## 2023-02-11 NOTE — Telephone Encounter (Signed)
Patient called stating that when she picked up Celebrex she noticed the rx number was different and now she is having irration on legs. Wants to know if it could be the medication or something else. Please advise?

## 2023-02-11 NOTE — Telephone Encounter (Signed)
Patient calling to follow up on note below says she needs to know what to do because her legs are itching so bad. Please advise Thank you

## 2023-02-11 NOTE — Telephone Encounter (Signed)
Patient advised.

## 2023-02-13 ENCOUNTER — Encounter: Payer: Self-pay | Admitting: Internal Medicine

## 2023-02-13 ENCOUNTER — Telehealth: Payer: Self-pay | Admitting: Internal Medicine

## 2023-02-13 ENCOUNTER — Ambulatory Visit (INDEPENDENT_AMBULATORY_CARE_PROVIDER_SITE_OTHER): Payer: PPO | Admitting: Internal Medicine

## 2023-02-13 VITALS — BP 138/80 | HR 60 | Ht 63.0 in | Wt 184.6 lb

## 2023-02-13 DIAGNOSIS — E039 Hypothyroidism, unspecified: Secondary | ICD-10-CM

## 2023-02-13 DIAGNOSIS — H538 Other visual disturbances: Secondary | ICD-10-CM

## 2023-02-13 DIAGNOSIS — L239 Allergic contact dermatitis, unspecified cause: Secondary | ICD-10-CM | POA: Diagnosis not present

## 2023-02-13 DIAGNOSIS — M1712 Unilateral primary osteoarthritis, left knee: Secondary | ICD-10-CM | POA: Diagnosis not present

## 2023-02-13 DIAGNOSIS — H9313 Tinnitus, bilateral: Secondary | ICD-10-CM | POA: Insufficient documentation

## 2023-02-13 DIAGNOSIS — T466X5A Adverse effect of antihyperlipidemic and antiarteriosclerotic drugs, initial encounter: Secondary | ICD-10-CM

## 2023-02-13 DIAGNOSIS — I428 Other cardiomyopathies: Secondary | ICD-10-CM | POA: Diagnosis not present

## 2023-02-13 DIAGNOSIS — E782 Mixed hyperlipidemia: Secondary | ICD-10-CM | POA: Diagnosis not present

## 2023-02-13 DIAGNOSIS — G720 Drug-induced myopathy: Secondary | ICD-10-CM

## 2023-02-13 DIAGNOSIS — I10 Essential (primary) hypertension: Secondary | ICD-10-CM

## 2023-02-13 DIAGNOSIS — E1169 Type 2 diabetes mellitus with other specified complication: Secondary | ICD-10-CM | POA: Diagnosis not present

## 2023-02-13 MED ORDER — TRIAMCINOLONE ACETONIDE 0.1 % EX CREA: 1.0000 | TOPICAL_CREAM | Freq: Two times a day (BID) | CUTANEOUS | 0 refills | Status: DC

## 2023-02-13 NOTE — Assessment & Plan Note (Signed)
Rash over legs likely due to allergy,  unknown trigger Kenalog cream prescribed

## 2023-02-13 NOTE — Assessment & Plan Note (Signed)
Had Cataract surgery, but still reports blurry vision Referred to [facility] for second opinion as per patient request

## 2023-02-13 NOTE — Progress Notes (Signed)
Established Patient Office Visit  Subjective:  Patient ID: Adrienne [name], female    DOB: [birth year]  Age: 77 y.o. MRN: [id number]  CC:  Chief Complaint  Patient presents with   Hypertension    4 month follow up    Hypothyroidism    4 month follow up     HPI Adrienne [name] is a 77 y.o. female with past medical history of HTN, nonishcemic CM, hypothyroidism, OA of hip and knee, HLD, breast ca. s/p right mastectomy who presents for f/u of her chronic medical conditions.  HTN: BP is elevated today, but she has not had her medications yet. Takes Losartan and Amlodipine regularly. Coreg was discontinued due to bradycardia. Patient denies headache, dizziness, chest pain, dyspnea or palpitations.  Hypothyroidism: She takes NP thyroid 90 mg daily currently.  She has history of oversupplemented thyroid in the past.  She denies any recent change in appetite, weight, constipation, tremors or palpitations.  Type 2 DM: Her HbA1c was 6.7 in 04/24.  She was later placed on metformin, which she took for 1 month, but did not get refill.  She prefers to manage it with diet alone.  Denies any polyuria or polydipsia currently.  Nonischemic CM: She has been evaluated by cardiology.  She recently had echocardiogram, which showed MR with mild MVP.  She is planned to get cardiac MRI for cardiomyopathy.  HLD: She has hot intolerance to statins.  Her LDL was 145 in 04/24.  She had Pharm.D. visit for PCSK9 inhibitor therapy.  She was approved for Leqvio, but canceled her appointment for the injection due to very high co-pay.  She reports having itching rash on bilateral legs.  She was curious as she noticed new prescription number her Celebrex, but her dose has been the same.  Does not report any recent insect bite or any new chemical exposure.  She reports blurry vision bilaterally.  She had cataract surgery in 2024. She prefers to get second opinion for blurry vision.  Past Medical History:   Diagnosis Date   Allergy    Ambulates with cane    straight cane   Arthritis    knee, back   CHF (congestive heart failure) (HCC)    Ductal carcinoma in situ of breast 12/2018   R Breast-mastectomy only   Gout    History of blood transfusion 1986   w/ Hysterectomy surgery   Hyperlipidemia    diet controlled - no meds   Hypertension    Hypothyroidism    Pelvic kidney    lower right pelvic kidney    Prediabetes    diet controlled - no meds   SBO (small bowel obstruction) (HCC) 10/2016   surgery    Sleep apnea    Mild - no mask needed per sleep study   Smoker    quit smoking 2018    Past Surgical History:  Procedure Laterality Date   ABDOMINAL HYSTERECTOMY  1986   COMPLETE-precancerous   APPENDECTOMY     pt states it was removed when gallbladder was removed.   BACK SURGERY     lower back   BREAST BIOPSY Right 05/12/2019   times 2   BREAST LUMPECTOMY WITH RADIOACTIVE SEED LOCALIZATION Right 03/18/2019   Procedure: RIGHT BREAST LUMPECTOMY WITH RADIOACTIVE SEED LOCALIZATION;  Surgeon: Griselda Miner, MD;  Location: [facility] OR;  Service: General;  Laterality: Right;   CHOLECYSTECTOMY     COLONOSCOPY  02/15/2008   Arlyce Dice    COLONOSCOPY WITH PROPOFOL  02/27/2018   Dr.Nandigam   ECTOPIC PREGNANCY SURGERY     JOINT REPLACEMENT     Left hip total Dr. Lequita Halt 10-01-17   MASTECTOMY Right 07/26/2019   POLYPECTOMY     polypectomy-oropharynx     RE-EXCISION OF BREAST CANCER,SUPERIOR MARGINS Right 04/08/2019   Procedure: RE-EXCISION OF RIGHT BREAST ANTERIOR MARGINS;  Surgeon: Chevis Pretty III, MD;  Location: [facility];  Service: General;  Laterality: Right;   right knee meniscus     SBO  10/2016   small bowel obstruction   SIMPLE MASTECTOMY WITH AXILLARY SENTINEL NODE BIOPSY Right 07/26/2019   Procedure: RIGHT MASTECTOMY WITH SENTINEL NODE BIOPSY;  Surgeon: Griselda Miner, MD;  Location: [facility];  Service: General;  Laterality: Right;   TOTAL HIP ARTHROPLASTY Left  10/01/2017   Procedure: LEFT  TOTAL HIP ARTHROPLASTY ANTERIOR APPROACH;  Surgeon: Ollen Gross, MD;  Location: [facility];  Service: Orthopedics;  Laterality: Left;   TOTAL HIP ARTHROPLASTY Right 03/11/2018   Procedure: RIGHT TOTAL HIP ARTHROPLASTY ANTERIOR APPROACH;  Surgeon: Ollen Gross, MD;  Location: [facility];  Service: Orthopedics;  Laterality: Right;    UPPER GASTROINTESTINAL ENDOSCOPY      Family History  Problem Relation Age of Onset   Cancer Sister    Diabetes Brother    Diabetes Brother    Hypertension Other    Breast cancer Sister    Colon cancer Neg Hx    Colon polyps Neg Hx    Rectal cancer Neg Hx    Stomach cancer Neg Hx     Social History   Socioeconomic History   Marital status: Widowed    Spouse name: Not on file   Number of children: 3   Years of education: 21   Highest education level: Some college, no degree  Occupational History   Occupation: retired  Tobacco Use   Smoking status: Former    Current packs/day: 0.00    Average packs/day: 1 pack/day for 32.0 years (32.0 ttl pk-yrs)    Types: E-cigarettes, Cigarettes    Start date: 11/20/1984    Quit date: 11/20/2016    Years since quitting: 6.2   Smokeless tobacco: Never   Tobacco comments:    Smoker since 1975 has quit and started back several times!!,  Vaping Use   Vaping status: Former  Substance and Sexual Activity   Alcohol use: No   Drug use: No   Sexual activity: Not Currently    Birth control/protection: Surgical    Comment: Hysterectomy  Other Topics Concern   Not on file  Social History Narrative   Widow since 2006.Married previously for 25 years.Lives with grandson and 2 great grandchildren.Retired.Previously office work.   Social Determinants of Health   Financial Resource Strain: Low Risk  (06/27/2021)   Overall Financial Resource Strain (CARDIA)    Difficulty of Paying Living Expenses: Not hard at all  Food Insecurity: No Food Insecurity (06/27/2021)   Hunger Vital Sign     Worried About Running Out of Food in the Last Year: Never true    Ran Out of Food in the Last Year: Never true  Transportation Needs: No Transportation Needs (06/27/2021)   PRAPARE - Administrator, Civil Service (Medical): No    Lack of Transportation (Non-Medical): No  Physical Activity: Inactive (06/27/2021)   Exercise Vital Sign    Days of Exercise per Week: 0 days    Minutes of Exercise per Session: 0 min  Stress: No Stress Concern Present (  06/27/2021)   [place] [employer] - Occupational Stress Questionnaire    Feeling of Stress : Not at all  Social Connections: Moderately Integrated (06/27/2021)   Social Connection and Isolation Panel [NHANES]    Frequency of Communication with Friends and Family: More than three times a week    Frequency of Social Gatherings with Friends and Family: Once a week    Attends Religious Services: More than 4 times per year    Active Member of [employer] or Organizations: Yes    Attends Banker Meetings: More than 4 times per year    Marital Status: Widowed  Intimate Partner Violence: Not At Risk (06/27/2021)   Humiliation, Afraid, Rape, and Kick questionnaire    Fear of Current or Ex-Partner: No    Emotionally Abused: No    Physically Abused: No    Sexually Abused: No    Outpatient Medications Prior to Visit  Medication Sig Dispense Refill   amLODipine (NORVASC) 10 MG tablet Take 1 tablet by mouth once daily 90 tablet 0   celecoxib (CELEBREX) 100 MG capsule TAKE 1 CAPSULE BY MOUTH TWICE DAILY AS NEEDED FOR MILD TO MODERATE PAIN 60 capsule 0   Cholecalciferol (VITAMIN D-3) 125 MCG (5000 UT) TABS Take 2 tablets by mouth daily.     losartan (COZAAR) 100 MG tablet Take 1 tablet by mouth once daily 90 tablet 0   metFORMIN (GLUCOPHAGE-XR) 500 MG 24 hr tablet Take 1 tablet (500 mg total) by mouth daily with breakfast. 30 tablet 5   NP THYROID 90 MG tablet Take 1 tablet by mouth once daily 30 tablet 0   No  facility-administered medications prior to visit.    Allergies  Allergen Reactions   Lisinopril Swelling    Angioedema Angioedema Angioedema   Statins     Myalgias   Atorvastatin Other (See Comments)    Muscle aches   Muscle aches  Muscle aches    Celebrex [Celecoxib] Rash    Sensitive     ROS Review of Systems  Constitutional:  Negative for chills and fever.  HENT:  Positive for tinnitus. Negative for congestion, sinus pressure, sinus pain and sore throat.   Eyes:  Positive for visual disturbance. Negative for pain and discharge.  Respiratory:  Negative for cough and shortness of breath.   Cardiovascular:  Negative for chest pain and palpitations.  Gastrointestinal:  Negative for abdominal pain, diarrhea, nausea and vomiting.  Endocrine: Negative for polydipsia and polyuria.  Genitourinary:  Negative for dysuria and hematuria.  Musculoskeletal:  Positive for arthralgias. Negative for neck pain and neck stiffness.  Skin:  Positive for rash.  Neurological:  Negative for dizziness and weakness.  Psychiatric/Behavioral:  Negative for agitation and behavioral problems.       Objective:    Physical Exam Vitals reviewed.  Constitutional:      General: She is not in acute distress.    Appearance: She is obese. She is not diaphoretic.  HENT:     Head: Normocephalic and atraumatic.     Nose: Nose normal.     Mouth/Throat:     Mouth: Mucous membranes are moist.  Eyes:     General: No scleral icterus.    Extraocular Movements: Extraocular movements intact.  Cardiovascular:     Rate and Rhythm: Normal rate and regular rhythm.     Pulses: Normal pulses.     Heart sounds: Normal heart sounds. No murmur heard. Pulmonary:     Breath sounds: Normal breath sounds.  No wheezing or rales.  Musculoskeletal:     Cervical back: Neck supple. No tenderness.     Right lower leg: No edema.     Left lower leg: No edema.  Skin:    General: Skin is warm.     Findings: Rash  (Erythematous papules over bilateral legs) present.  Neurological:     General: No focal deficit present.     Mental Status: She is alert and oriented to person, place, and time.     Sensory: No sensory deficit.     Motor: No weakness.  Psychiatric:        Mood and Affect: Mood normal.        Behavior: Behavior normal.     BP 138/80 (BP Location: Left Arm)   Pulse 60   Ht 5\' 3"  (1.6 m)   Wt 184 lb 9.6 oz (83.7 kg)   SpO2 95%   BMI 32.70 kg/m  Wt Readings from Last 3 Encounters:  02/13/23 184 lb 9.6 oz (83.7 kg)  11/07/22 191 lb 6.4 oz (86.8 kg)  10/10/22 188 lb 9.6 oz (85.5 kg)    Lab Results  Component Value Date   TSH 2.130 10/10/2022   Lab Results  Component Value Date   WBC 9.7 10/10/2022   HGB 12.5 10/10/2022   HCT 38.4 10/10/2022   MCV 91 10/10/2022   PLT 242 10/10/2022   Lab Results  Component Value Date   NA 146 (H) 10/10/2022   K 4.3 10/10/2022   CO2 25 10/10/2022   GLUCOSE 106 (H) 10/10/2022   BUN 10 10/10/2022   CREATININE 0.96 10/10/2022   BILITOT 0.5 10/10/2022   ALKPHOS 93 10/10/2022   AST 46 (H) 10/10/2022   ALT 41 (H) 10/10/2022   PROT 7.1 10/10/2022   ALBUMIN 4.1 10/10/2022   CALCIUM 9.8 10/10/2022   ANIONGAP 12 07/22/2019   EGFR 62 10/10/2022   GFR 74.79 07/15/2013   Lab Results  Component Value Date   CHOL 219 (H) 10/10/2022   Lab Results  Component Value Date   HDL 53 10/10/2022   Lab Results  Component Value Date   LDLCALC 145 (H) 10/10/2022   Lab Results  Component Value Date   TRIG 118 10/10/2022   Lab Results  Component Value Date   CHOLHDL 4.1 10/10/2022   Lab Results  Component Value Date   HGBA1C 6.7 (H) 10/10/2022      Assessment & Plan:   Problem List Items Addressed This Visit       Cardiovascular and Mediastinum   Hypertension - Primary    BP Readings from Last 1 Encounters:  02/13/23 138/80   Usually well-controlled with Losartan 100 mg QD and amlodipine 10 mg daily Elevated today as she has  not had her meds yet Counseled for compliance with the medications Advised DASH diet and moderate exercise/walking, at least 150 mins/week      Nonischemic cardiomyopathy (HCC)    On Losartan Stopped Coreg due to bradycardia Followed by Cardiology        Endocrine   Hypothyroidism    Lab Results  Component Value Date   TSH 2.130 10/10/2022   On NP thyroid 90 mg QD, had decreased dose in the past due to oversupplementation Discussed about switching to Levothyroxine, she would prefer to stay on NP thyroid for now Check TSH and free T4      Relevant Orders   TSH + free T4   Diabetes mellitus (HCC)    Lab  Results  Component Value Date   HGBA1C 6.7 (H) 10/10/2022   New onset Associated with HTN and HLD Was placed on Metformin 500 mg QD now, but did not continue as she prefers to manage it with diet Advised to follow diabetic diet On ARB F/u CMP and lipid panel Diabetic eye exam: Advised to follow up with Ophthalmology for diabetic eye exam       Relevant Orders   CMP14+EGFR   Hemoglobin A1c     Musculoskeletal and Integument   Primary osteoarthritis of left knee    On Celecoxib now Could not continue Diclofenac due to cost concern      Statin myopathy    Has had trials of different statins, had myalgias      Allergic dermatitis    Rash over legs likely due to allergy,  unknown trigger Kenalog cream prescribed      Relevant Medications   triamcinolone cream (KENALOG) 0.1 %     Other   HLD (hyperlipidemia)    Has uncontrolled HLD Did not tolerate statins Had Pharm.D. consultation for PCSK9 inhibitor therapy through cardiology, was approved for Leqvio, but had very high co-pay Advised to consider Repatha as alternative, she is going to discuss with Pharm.DKennis Carina vision    Had Cataract surgery, but still reports blurry vision Referred to [facility] for second opinion as per patient request      Relevant Orders   Ambulatory referral to  Ophthalmology   Tinnitus of both ears    Has chronic tinnitus, advised to contact ENT specialist for further evaluation       Meds ordered this encounter  Medications   triamcinolone cream (KENALOG) 0.1 %    Sig: Apply 1 Application topically 2 (two) times daily.    Dispense:  30 g    Refill:  0    Follow-up: Return in about 4 months (around 06/15/2023) for DM and hypothyroidism.    Anabel Halon, MD

## 2023-02-13 NOTE — Assessment & Plan Note (Addendum)
Lab Results  Component Value Date   HGBA1C 6.7 (H) 10/10/2022   New onset Associated with HTN and HLD Was placed on Metformin 500 mg QD now, but did not continue as she prefers to manage it with diet Advised to follow diabetic diet On ARB F/u CMP and lipid panel Diabetic eye exam: Advised to follow up with Ophthalmology for diabetic eye exam

## 2023-02-13 NOTE — Assessment & Plan Note (Signed)
Has chronic tinnitus, advised to contact ENT specialist for further evaluation

## 2023-02-13 NOTE — Telephone Encounter (Signed)
Patient advised.

## 2023-02-13 NOTE — Assessment & Plan Note (Addendum)
BP Readings from Last 1 Encounters:  02/13/23 138/80   Usually well-controlled with Losartan 100 mg QD and amlodipine 10 mg daily Elevated today as she has not had her meds yet Counseled for compliance with the medications Advised DASH diet and moderate exercise/walking, at least 150 mins/week

## 2023-02-13 NOTE — Assessment & Plan Note (Signed)
On Losartan Stopped Coreg due to bradycardia Followed by Cardiology

## 2023-02-13 NOTE — Assessment & Plan Note (Signed)
Has had trials of different statins, had myalgias

## 2023-02-13 NOTE — Assessment & Plan Note (Signed)
On Celecoxib now Could not continue Diclofenac due to cost concern

## 2023-02-13 NOTE — Assessment & Plan Note (Signed)
Lab Results  Component Value Date   TSH 2.130 10/10/2022   On NP thyroid 90 mg QD, had decreased dose in the past due to oversupplementation Discussed about switching to Levothyroxine, she would prefer to stay on NP thyroid for now Check TSH and free T4

## 2023-02-13 NOTE — Patient Instructions (Signed)
Please continue to take medications as prescribed. ? ?Please continue to follow low carb diet and perform moderate exercise/walking at least 150 mins/week. ?

## 2023-02-13 NOTE — Assessment & Plan Note (Signed)
Has uncontrolled HLD Did not tolerate statins Had Pharm.D. consultation for PCSK9 inhibitor therapy through cardiology, was approved for Leqvio, but had very high co-pay Advised to consider Repatha as alternative, she is going to discuss with Pharm.D.

## 2023-02-14 ENCOUNTER — Other Ambulatory Visit: Payer: Self-pay | Admitting: Internal Medicine

## 2023-02-14 DIAGNOSIS — E1169 Type 2 diabetes mellitus with other specified complication: Secondary | ICD-10-CM

## 2023-02-14 DIAGNOSIS — E039 Hypothyroidism, unspecified: Secondary | ICD-10-CM

## 2023-02-14 LAB — HEMOGLOBIN A1C
Est. average glucose Bld gHb Est-mCnc: 154 mg/dL
Hgb A1c MFr Bld: 7 % — ABNORMAL HIGH (ref 4.8–5.6)

## 2023-02-14 LAB — TSH+FREE T4
Free T4: 1.05 ng/dL (ref 0.82–1.77)
TSH: 0.308 u[IU]/mL — ABNORMAL LOW (ref 0.450–4.500)

## 2023-02-14 LAB — CMP14+EGFR
ALT: 22 IU/L (ref 0–32)
AST: 28 IU/L (ref 0–40)
Albumin: 4.4 g/dL (ref 3.8–4.8)
Alkaline Phosphatase: 106 IU/L (ref 44–121)
BUN/Creatinine Ratio: 13 (ref 12–28)
BUN: 14 mg/dL (ref 8–27)
Bilirubin Total: 0.4 mg/dL (ref 0.0–1.2)
CO2: 23 mmol/L (ref 20–29)
Calcium: 10.2 mg/dL (ref 8.7–10.3)
Chloride: 103 mmol/L (ref 96–106)
Creatinine, Ser: 1.05 mg/dL — ABNORMAL HIGH (ref 0.57–1.00)
Globulin, Total: 3.4 g/dL (ref 1.5–4.5)
Glucose: 125 mg/dL — ABNORMAL HIGH (ref 70–99)
Potassium: 4.3 mmol/L (ref 3.5–5.2)
Sodium: 140 mmol/L (ref 134–144)
Total Protein: 7.8 g/dL (ref 6.0–8.5)
eGFR: 55 mL/min/{1.73_m2} — ABNORMAL LOW (ref >59)

## 2023-02-14 MED ORDER — METFORMIN HCL ER 500 MG PO TB24
500.0000 mg | ORAL_TABLET | Freq: Every day | ORAL | 5 refills | Status: DC
Start: 2023-02-14 — End: 2023-06-28

## 2023-02-14 MED ORDER — THYROID 90 MG PO TABS
90.0000 mg | ORAL_TABLET | Freq: Every day | ORAL | 5 refills | Status: DC
Start: 2023-02-14 — End: 2023-09-17

## 2023-02-27 ENCOUNTER — Telehealth: Payer: Self-pay | Admitting: Cardiology

## 2023-02-27 DIAGNOSIS — D0511 Intraductal carcinoma in situ of right breast: Secondary | ICD-10-CM | POA: Diagnosis not present

## 2023-02-27 NOTE — Telephone Encounter (Signed)
Returned call to pt. Pt is wanting to get LEQVIO injections. Pt was scheduled for the injection on the 20th of June and she called to find out to how much it would cost and it was $2200 and she could not afford it. She wants to know how much it is truly gonna cost her. She states she has been on the phone all morning about this.  Will forward this to Dr. Antoine Poche and his nurse. She has called her insurance company as well and they told her to call us. Please advise.

## 2023-02-27 NOTE — Telephone Encounter (Signed)
Patient calling in about getting injections. Please advise

## 2023-03-03 ENCOUNTER — Telehealth: Payer: Self-pay | Admitting: Internal Medicine

## 2023-03-03 NOTE — Telephone Encounter (Signed)
Pt following up on phone note from 9/5. Please advise

## 2023-03-03 NOTE — Telephone Encounter (Signed)
Routed to pharmacy team 

## 2023-03-03 NOTE — Telephone Encounter (Signed)
Patient calling says she got a message in MyChart saying her thyroid levels were extremely low- not sure what to do. Please advise Thank you

## 2023-03-03 NOTE — Telephone Encounter (Signed)
I spoke with patient. Advised that with HTA Wilber Bihari is very expensive if she doesn't qualify for assistance. I will reach out to [facility] to see if anything has been started to look to see if she can get manufacture assistance. She did ask about Repatha. I did advise that this is would be cheaper option that Leqvio. She would like to pursue pt assistance with Leqvio first and then if not Repatha can be plan B.

## 2023-03-04 NOTE — Telephone Encounter (Signed)
Pt has been informed.

## 2023-03-06 ENCOUNTER — Ambulatory Visit
Admission: RE | Admit: 2023-03-06 | Discharge: 2023-03-06 | Disposition: A | Discharge status: Home / Self Care | Payer: PPO | Source: Ambulatory Visit | Attending: General Surgery | Admitting: General Surgery

## 2023-03-06 DIAGNOSIS — Z1231 Encounter for screening mammogram for malignant neoplasm of breast: Secondary | ICD-10-CM | POA: Diagnosis not present

## 2023-03-11 ENCOUNTER — Ambulatory Visit (HOSPITAL_COMMUNITY): Payer: PPO

## 2023-03-18 NOTE — Telephone Encounter (Signed)
Adrienne [name], Request has been sent to [employer] to enroll patient in the [facility] NPAF (free drug).  I will f/u once patient has been approved.

## 2023-04-01 ENCOUNTER — Ambulatory Visit (INDEPENDENT_AMBULATORY_CARE_PROVIDER_SITE_OTHER): Payer: PPO | Admitting: Audiology

## 2023-04-01 ENCOUNTER — Ambulatory Visit (INDEPENDENT_AMBULATORY_CARE_PROVIDER_SITE_OTHER): Payer: PPO | Admitting: Otolaryngology

## 2023-04-01 ENCOUNTER — Encounter (INDEPENDENT_AMBULATORY_CARE_PROVIDER_SITE_OTHER): Payer: Self-pay | Admitting: Otolaryngology

## 2023-04-01 VITALS — Ht 63.0 in | Wt 173.0 lb

## 2023-04-01 DIAGNOSIS — H6123 Impacted cerumen, bilateral: Secondary | ICD-10-CM

## 2023-04-01 DIAGNOSIS — H903 Sensorineural hearing loss, bilateral: Secondary | ICD-10-CM

## 2023-04-01 DIAGNOSIS — H9313 Tinnitus, bilateral: Secondary | ICD-10-CM | POA: Diagnosis not present

## 2023-04-02 DIAGNOSIS — H6123 Impacted cerumen, bilateral: Secondary | ICD-10-CM | POA: Insufficient documentation

## 2023-04-02 DIAGNOSIS — H903 Sensorineural hearing loss, bilateral: Secondary | ICD-10-CM | POA: Insufficient documentation

## 2023-04-02 NOTE — Progress Notes (Signed)
Patient ID: Adrienne [name], female   DOB: 08/06/1945, 77 y.o.   MRN: [id number]  Follow-up: Bilateral tinnitus, hearing loss  HPI: The patient is a 77 year old female who returns today for her follow-up evaluation.  She was last seen 1 year ago.  At that time, she was complaining of bilateral tinnitus for 10+ years.  She described her tinnitus as a constant nonpulsatile high-pitched noise.  She was noted to have bilateral cerumen impaction and bilateral high-frequency sensorineural hearing loss.  She was treated with cerumen disimpaction.  The strategies to cope with tinnitus were discussed.  The patient returns today complaining of persistent bilateral tinnitus.  She also complains of progressive hearing loss.  She is having significant difficulty hearing in noisy environments, such as in her church.  She denies any otalgia, otorrhea, or vertigo.  Exam: General: Communicates without difficulty, well nourished, no acute distress. Head: Normocephalic, no evidence injury, no tenderness, facial buttresses intact without stepoff. Face/sinus: No tenderness to palpation and percussion. Facial movement is normal and symmetric. Eyes: PERRL, EOMI. No scleral icterus, conjunctivae clear. Neuro: CN II exam reveals vision grossly intact.  No nystagmus at any point of gaze. EAC: Bilateral cerumen impaction.  Under the operating microscope, the cerumen is carefully removed with a combination of cerumen currette, alligator forceps, and suction catheters.  After the cerumen is removed, the TMs are noted to be normal. Nose: External evaluation reveals normal support and skin without lesions.  Dorsum is intact.  Anterior rhinoscopy reveals pink mucosa over anterior aspect of inferior turbinates and intact septum.  No purulence noted. Oral:  Oral cavity and oropharynx are intact, symmetric, without erythema or edema.  Mucosa is moist without lesions. Neck: Full range of motion without pain.  There is no significant  lymphadenopathy.  No masses palpable.  Thyroid bed within normal limits to palpation.  Parotid glands and submandibular glands equal bilaterally without mass.  Trachea is midline. Neuro:  CN 2-12 grossly intact. Gait normal.   Procedure: Bilateral cerumen disimpaction Anesthesia: None Description: Under the operating microscope, the cerumen is carefully removed with a combination of cerumen currette, alligator forceps, and suction catheters.  After the cerumen is removed, the TMs are noted to be normal.  No mass, erythema, or lesions. The patient tolerated the procedure well.   AUDIOMETRIC TESTING: I have read and reviewed the audiometric test, which shows bilateral high-frequency sensorineural hearing loss, slightly worse on the right. The speech reception threshold is 35dB AD and 15dB AS. The discrimination score is 60% AD and 72% AS. The tympanogram is normal bilaterally.   Assessment: 1.  Bilateral cerumen impaction.  After the cerumen disimpaction procedure, both tympanic membranes and middle ear spaces are noted to be normal. 2.  Stable bilateral high-frequency sensorineural hearing loss, slightly worse on the right side. 3.  The patient's tinnitus is likely a direct result of the hearing loss.  Plan: 1.  Otomicroscopy with bilateral cerumen disimpaction. 2.  The physical exam findings and the hearing test results are reviewed with the patient.  3.  The strategies to cope with tinnitus, including the use of masker, hearing aids, tinnitus retraining therapy, and avoidance of caffeine and alcohol are discussed.  4.  The patient is a candidate for hearing amplification.  The hearing aid options are discussed.  5.  The patient will return for reevaluation in 1 year.

## 2023-04-02 NOTE — Progress Notes (Signed)
[facility] [address], [address] [place], [place] [address]  Audiological Evaluation   Adrienne [name] comes today by herself  after Dr. Suszanne Conners, ENT sent a referral for a hearing evaluation due to concerns with tinnitus and hearing loss in both ears .  History:  Symptoms Yes Details  Hearing Loss      x Previous audiogram from 03-28-2022 indicated bilateral hearing loss and the right ear is worse than the left.  Tinnitus     x Both ears  Ear Pain    Balance Problems    Noise Exposure    Previous ear surgeries    Family History    Amplification       Otoscopy:  Right ear: Clear external ear canals and normal landmarks in the tympanic membrane.  Left ear: Clear external ear canals and normal landmarks in the tympanic membrane.   Tympanogram:  Right ear: Normal external ear canal volume with normal middle ear pressure and tympanic membrane compliance.   Left ear: Normal external ear canal volume with normal middle ear pressure and tympanic membrane compliance.    Hearing Evaluation:  The hearing test was completed using conventional audiometric techniques . under headphones and inserts.  The hearing test results indicate: Right ear- Normal to moderate sensorineural hearing loss from [phone] Hz Left ear-  Normal to moderately severe sensorineural hearing loss from [phone] Hz.  Of note asymmetry continues to be observed, right ear worse than left from [phone] Hz.  Speech Recognition Thresholds were obtained at 35dBHL in the right ear and 15dBHL in the left ear.  Word Recognition Testing using the NU-6 word list was completed at 75 dBHL in the  right ear and at 70 dBHL in the left ear and the patient scored 60% in the right ear and 72% in the left ear.  Today's test results indicate that there has not been a significant change in hearing thresholds when compared to the previous audiogram on file.      Recommendations:  1- Follow up with ENT as  scheduled for today. 2- Return for a hearing evaluation if concerns with hearing changes arise or per MD recommendation.  3- Consider a communication needs assessment after medical clearance for hearing aids is obtained.

## 2023-04-09 ENCOUNTER — Telehealth: Payer: Self-pay | Admitting: Pharmacy Technician

## 2023-04-09 NOTE — Telephone Encounter (Signed)
error 

## 2023-04-09 NOTE — Telephone Encounter (Signed)
Leqvio patient assistance has been approved and is in media tab

## 2023-04-22 ENCOUNTER — Ambulatory Visit (INDEPENDENT_AMBULATORY_CARE_PROVIDER_SITE_OTHER): Payer: PPO | Admitting: *Deleted

## 2023-04-22 VITALS — BP 119/65 | HR 40 | Temp 98.3°F | Resp 16 | Ht 63.0 in | Wt 181.0 lb

## 2023-04-22 DIAGNOSIS — E782 Mixed hyperlipidemia: Secondary | ICD-10-CM

## 2023-04-22 MED ORDER — INCLISIRAN SODIUM 284 MG/1.5ML ~~LOC~~ SOSY
284.0000 mg | PREFILLED_SYRINGE | Freq: Once | SUBCUTANEOUS | Status: AC
  Administered 2023-04-22: 284 mg via SUBCUTANEOUS

## 2023-04-22 NOTE — Progress Notes (Signed)
Diagnosis: Hyperlipidemia  Provider:  Mannam, Praveen MD  Procedure: Injection  Leqvio (inclisiran), Dose: 284 mg, Site: subcutaneous, Number of injections: 1  Post Care: Observation period completed  Discharge: Condition: Good, Destination: Home . AVS Provided  Performed by:  Williams, Kathryn A, RN       

## 2023-04-22 NOTE — Patient Instructions (Signed)
 Inclisiran Injection What is this medication? INCLISIRAN (in kli SIR an) treats high cholesterol. It works by decreasing bad cholesterol (such as LDL) in your blood. Changes to diet and exercise are often combined with this medication. This medicine may be used for other purposes; ask your health care provider or pharmacist if you have questions. COMMON BRAND NAME(S): LEQVIO What should I tell my care team before I take this medication? They need to know if you have any of these conditions: An unusual or allergic reaction to inclisiran, other medications, foods, dyes, or preservatives Pregnant or trying to get pregnant Breast-feeding How should I use this medication? This medication is injected under the skin. It is given by your care team in a hospital or clinic setting. Talk to your care team about the use of this medication in children. Special care may be needed. Overdosage: If you think you have taken too much of this medicine contact a poison control center or emergency room at once. NOTE: This medicine is only for you. Do not share this medicine with others. What if I miss a dose? Keep appointments for follow-up doses. It is important not to miss your dose. Call your care team if you are unable to keep an appointment. What may interact with this medication? Interactions are not expected. This list may not describe all possible interactions. Give your health care provider a list of all the medicines, herbs, non-prescription drugs, or dietary supplements you use. Also tell them if you smoke, drink alcohol, or use illegal drugs. Some items may interact with your medicine. What should I watch for while using this medication? Visit your care team for regular checks on your progress. Tell your care team if your symptoms do not start to get better or if they get worse. You may need blood work while you are taking this medication. What side effects may I notice from receiving this  medication? Side effects that you should report to your care team as soon as possible: Allergic reactions--skin rash, itching, hives, swelling of the face, lips, tongue, or throat Side effects that usually do not require medical attention (report these to your care team if they continue or are bothersome): Joint pain Pain, redness, or irritation at injection site This list may not describe all possible side effects. Call your doctor for medical advice about side effects. You may report side effects to FDA at 1-800-FDA-1088. Where should I keep my medication? This medication is given in a hospital or clinic. It will not be stored at home. NOTE: This sheet is a summary. It may not cover all possible information. If you have questions about this medicine, talk to your doctor, pharmacist, or health care provider.  2024 Elsevier/Gold Standard (2022-01-04 00:00:00)

## 2023-04-25 ENCOUNTER — Other Ambulatory Visit: Payer: Self-pay | Admitting: Internal Medicine

## 2023-04-25 DIAGNOSIS — I10 Essential (primary) hypertension: Secondary | ICD-10-CM

## 2023-04-25 DIAGNOSIS — M1712 Unilateral primary osteoarthritis, left knee: Secondary | ICD-10-CM

## 2023-05-05 ENCOUNTER — Other Ambulatory Visit: Payer: Self-pay | Admitting: Internal Medicine

## 2023-05-05 DIAGNOSIS — I428 Other cardiomyopathies: Secondary | ICD-10-CM

## 2023-05-05 DIAGNOSIS — I10 Essential (primary) hypertension: Secondary | ICD-10-CM

## 2023-05-06 DIAGNOSIS — H26493 Other secondary cataract, bilateral: Secondary | ICD-10-CM | POA: Diagnosis not present

## 2023-05-12 ENCOUNTER — Telehealth (HOSPITAL_COMMUNITY): Payer: Self-pay | Admitting: *Deleted

## 2023-05-12 NOTE — Telephone Encounter (Signed)

## 2023-05-13 ENCOUNTER — Other Ambulatory Visit: Payer: Self-pay | Admitting: Cardiology

## 2023-05-13 ENCOUNTER — Ambulatory Visit (HOSPITAL_COMMUNITY)
Admission: RE | Admit: 2023-05-13 | Discharge: 2023-05-13 | Disposition: A | Discharge status: Home / Self Care | Payer: PPO | Source: Ambulatory Visit | Attending: Cardiology | Admitting: Cardiology

## 2023-05-13 DIAGNOSIS — I428 Other cardiomyopathies: Secondary | ICD-10-CM

## 2023-05-13 MED ORDER — GADOBUTROL 1 MMOL/ML IV SOLN
10.0000 mL | Freq: Once | INTRAVENOUS | Status: AC | PRN
  Administered 2023-05-13: 10 mL via INTRAVENOUS

## 2023-05-15 ENCOUNTER — Ambulatory Visit (INDEPENDENT_AMBULATORY_CARE_PROVIDER_SITE_OTHER): Payer: PPO | Admitting: Otolaryngology

## 2023-05-15 ENCOUNTER — Encounter (INDEPENDENT_AMBULATORY_CARE_PROVIDER_SITE_OTHER): Payer: Self-pay | Admitting: Otolaryngology

## 2023-05-15 VITALS — BP 177/76 | HR 54 | Ht 63.0 in | Wt 178.0 lb

## 2023-05-15 DIAGNOSIS — R09A2 Foreign body sensation, throat: Secondary | ICD-10-CM

## 2023-05-15 DIAGNOSIS — K219 Gastro-esophageal reflux disease without esophagitis: Secondary | ICD-10-CM

## 2023-05-15 DIAGNOSIS — R0981 Nasal congestion: Secondary | ICD-10-CM

## 2023-05-15 DIAGNOSIS — J3089 Other allergic rhinitis: Secondary | ICD-10-CM

## 2023-05-15 DIAGNOSIS — R0982 Postnasal drip: Secondary | ICD-10-CM

## 2023-05-15 DIAGNOSIS — H903 Sensorineural hearing loss, bilateral: Secondary | ICD-10-CM

## 2023-05-15 DIAGNOSIS — R1310 Dysphagia, unspecified: Secondary | ICD-10-CM

## 2023-05-15 MED ORDER — CETIRIZINE HCL 10 MG PO TABS: 10.0000 mg | ORAL_TABLET | Freq: Every day | ORAL | 11 refills | Status: DC

## 2023-05-15 MED ORDER — SALINE SPRAY 0.65 % NA SOLN: 1.0000 | NASAL | 5 refills | Status: DC | PRN

## 2023-05-15 MED ORDER — FAMOTIDINE 20 MG PO TABS: 20.0000 mg | ORAL_TABLET | Freq: Two times a day (BID) | ORAL | 1 refills | Status: DC

## 2023-05-15 MED ORDER — FLUTICASONE PROPIONATE 50 MCG/ACT NA SUSP: 2.0000 | Freq: Every day | NASAL | 6 refills | Status: DC

## 2023-05-15 NOTE — Patient Instructions (Signed)
-   schedule swallow study - start Flonase/Ocean Spray and Zyrtec - start Famotidine for reflux  - return after swallow study

## 2023-05-15 NOTE — Progress Notes (Signed)
ENT CONSULT:  Reason for Consult: dysphagia, globus sensation    HPI: Discussed the use of AI scribe software for clinical note transcription with the patient, who gave verbal consent to proceed.  History of Present Illness   The patient is a 67 yoF hx of T2DM, ILD, emphysema, CHF, SNHL, presents with a chief complaint of difficulty swallowing, which they describe as a sensation of food only going down one side of their throat. This has been ongoing for approximately 12-13 months. The patient denies any associated coughing or choking when eating or drinking and reports no dietary restrictions due to the swallowing issue.  They have experienced instances where pills, particularly small fish oil capsules and metformin, have felt lodged in their throat, requiring additional effort to swallow. The patient has no history of stroke or other major medical problems that could contribute to dysphagia. They have a history of breast cancer, for which they underwent a mastectomy but did not receive radiation or chemotherapy.  The patient also reports a sensation of excessive mucus in their throat, particularly in the mornings, and recent episodes of nasal congestion and sneezing. They have not previously used any medications for these symptoms. The patient has no history of neck or throat surgeries.    Records Reviewed:  Office note by Dr Suszanne Conners 04/01/23 Plan: 1.  Otomicroscopy with bilateral cerumen disimpaction. 2.  The physical exam findings and the hearing test results are reviewed with the patient.  3.  The strategies to cope with tinnitus, including the use of masker, hearing aids, tinnitus retraining therapy, and avoidance of caffeine and alcohol are discussed.  4.  The patient is a candidate for hearing amplification.  The hearing aid options are discussed.  5.  The patient will return for reevaluation in 1 year.  PCP office note 02/13/23 - sent to Dr Suszanne Conners for tininitus  Tinnitus of both ears        Has chronic tinnitus, advised to contact ENT specialist for further evaluation       Past Medical History:  Diagnosis Date   Allergy    Ambulates with cane    straight cane   Arthritis    knee, back   CHF (congestive heart failure) (HCC)    Ductal carcinoma in situ of breast 12/2018   R Breast-mastectomy only   Gout    History of blood transfusion 1986   w/ Hysterectomy surgery   Hyperlipidemia    diet controlled - no meds   Hypertension    Hypothyroidism    Pelvic kidney    lower right pelvic kidney    Prediabetes    diet controlled - no meds   SBO (small bowel obstruction) (HCC) 10/2016   surgery    Sleep apnea    Mild - no mask needed per sleep study   Smoker    quit smoking 2018    Past Surgical History:  Procedure Laterality Date   ABDOMINAL HYSTERECTOMY  1986   COMPLETE-precancerous   APPENDECTOMY     pt states it was removed when gallbladder was removed.   AUGMENTATION MAMMAPLASTY     BACK SURGERY     lower back   BREAST BIOPSY Right 05/12/2019   times 2   BREAST LUMPECTOMY WITH RADIOACTIVE SEED LOCALIZATION Right 03/18/2019   Procedure: RIGHT BREAST LUMPECTOMY WITH RADIOACTIVE SEED LOCALIZATION;  Surgeon: Griselda Miner, MD;  [facility];  Service: General;  Laterality: Right;   CHOLECYSTECTOMY     COLONOSCOPY  02/15/2008  Kaplan    COLONOSCOPY WITH PROPOFOL  02/27/2018   Dr.Nandigam   ECTOPIC PREGNANCY SURGERY     JOINT REPLACEMENT     Left hip total Dr. Lequita Halt 10-01-17   MASTECTOMY Right 07/26/2019   POLYPECTOMY     polypectomy-oropharynx     RE-EXCISION OF BREAST CANCER,SUPERIOR MARGINS Right 04/08/2019   Procedure: RE-EXCISION OF RIGHT BREAST ANTERIOR MARGINS;  Surgeon: Chevis Pretty III, MD;  Location: [facility] ORS;  Service: General;  Laterality: Right;   right knee meniscus     SBO  10/2016   small bowel obstruction   SIMPLE MASTECTOMY WITH AXILLARY SENTINEL NODE BIOPSY Right 07/26/2019   Procedure: RIGHT MASTECTOMY WITH SENTINEL NODE BIOPSY;   Surgeon: Griselda Miner, MD;  Location: [facility];  Service: General;  Laterality: Right;   TOTAL HIP ARTHROPLASTY Left 10/01/2017   Procedure: LEFT  TOTAL HIP ARTHROPLASTY ANTERIOR APPROACH;  Surgeon: Ollen Gross, MD;  Location: [facility] OR[facility];  Service: Orthopedics;  Laterality: Left;   TOTAL HIP ARTHROPLASTY Right 03/11/2018   Procedure: RIGHT TOTAL HIP ARTHROPLASTY ANTERIOR APPROACH;  Surgeon: Ollen Gross, MD;  Location: [facility];  Service: Orthopedics;  Laterality: Right;    UPPER GASTROINTESTINAL ENDOSCOPY      Family History  Problem Relation Age of Onset   Cancer Sister    Diabetes Brother    Diabetes Brother    Hypertension Other    Breast cancer Sister    Colon cancer Neg Hx    Colon polyps Neg Hx    Rectal cancer Neg Hx    Stomach cancer Neg Hx     Social History:  reports that she quit smoking about 6 years ago. Her smoking use included e-cigarettes and cigarettes. She started smoking about 38 years ago. She has a 32 pack-year smoking history. She has never used smokeless tobacco. She reports that she does not drink alcohol and does not use drugs.  Allergies:  Allergies  Allergen Reactions   Lisinopril Swelling    Angioedema Angioedema Angioedema   Statins     Myalgias   Atorvastatin Other (See Comments)    Muscle aches   Muscle aches  Muscle aches     Medications: I have reviewed the patient's current medications.  The PMH, PSH, Medications, Allergies, and SH were reviewed and updated.  ROS: Constitutional: Negative for fever, weight loss and weight gain. Cardiovascular: Negative for chest pain and dyspnea on exertion. Respiratory: Is not experiencing shortness of breath at rest. Gastrointestinal: Negative for nausea and vomiting. Neurological: Negative for headaches. Psychiatric: The patient is not nervous/anxious  Blood pressure (!) 177/76, pulse (!) 54, height 5\' 3"  (1.6 m), weight 178 lb (80.7 kg), SpO2 96%.  PHYSICAL  EXAM:  Exam: General: Well-developed, well-nourished Respiratory Respiratory effort: Equal inspiration and expiration without stridor Cardiovascular Peripheral Vascular: Warm extremities with equal color/perfusion Eyes: No nystagmus with equal extraocular motion bilaterally Neuro/Psych/Balance: Patient oriented to person, place, and time; Appropriate mood and affect; Gait is intact with no imbalance; Cranial nerves I-XII are intact Head and Face Inspection: Normocephalic and atraumatic without mass or lesion Palpation: Facial skeleton intact without bony stepoffs Salivary Glands: No mass or tenderness Facial Strength: Facial motility symmetric and full bilaterally ENT Pinna: External ear intact and fully developed External canal: Canal is patent with intact skin Tympanic Membrane: Clear and mobile External Nose: No scar or anatomic deformity Internal Nose: Septum is relatively straight. No polyp, or purulence. Mucosal edema and erythema present.  Bilateral inferior turbinate hypertrophy.  Lips, Teeth, and gums: Mucosa and teeth intact and viable TMJ: No pain to palpation with full mobility Oral cavity/oropharynx: No erythema or exudate, no lesions present Nasopharynx: No mass or lesion with intact mucosa Hypopharynx: Intact mucosa without pooling of secretions Larynx Glottic: Full true vocal cord mobility without lesion or mass Supraglottic: Normal appearing epiglottis and AE folds Interarytenoid Space: Moderate post-cricoid edema/pachydermia Subglottic Space: No lesion/edema Neck Neck and Trachea: Midline trachea without mass or lesion Thyroid: No mass or nodularity Lymphatics: No lymphadenopathy  Procedure: Preoperative diagnosis: dysphagia, globus sensation   Postoperative diagnosis:   Same + GERD/LPR  Procedure: Flexible fiberoptic laryngoscopy  Surgeon: Ashok Croon, MD  Anesthesia: Topical lidocaine and Afrin Complications: None Condition is stable throughout  exam  Indications and consent:  The patient presents to the clinic with Indirect laryngoscopy view was incomplete. Thus it was recommended that they undergo a flexible fiberoptic laryngoscopy. All of the risks, benefits, and potential complications were reviewed with the patient preoperatively and verbal informed consent was obtained.  Procedure: The patient was seated upright in the clinic. Topical lidocaine and Afrin were applied to the nasal cavity. After adequate anesthesia had occurred, I then proceeded to pass the flexible telescope into the nasal cavity. The nasal cavity was patent without rhinorrhea or polyp. The nasopharynx was also patent without mass or lesion. The base of tongue was visualized and was normal. There were no signs of pooling of secretions in the piriform sinuses. The true vocal folds were mobile bilaterally. There were no signs of glottic or supraglottic mucosal lesion or mass. There was moderate interarytenoid pachydermia and post cricoid edema. The telescope was then slowly withdrawn and the patient tolerated the procedure throughout.  Studies Reviewed:CT chest 09/05/22 1. Spectrum of findings compatible with mild basilar predominant fibrotic interstitial lung disease without frank honeycombing, without appreciable interval change from recent screening chest CT. Imaging differential includes mild NSIP or UIP. Findings are categorized as probable UIP per consensus guidelines: Diagnosis of Idiopathic Pulmonary Fibrosis: An Official ATS/ERS/JRS/ALAT Clinical Practice Guideline. Am Rosezetta Schlatter Crit Care Med Vol 198, Iss 5, [phone], Feb 22 2017. 2. Stable mild cardiomegaly. Three-vessel coronary atherosclerosis. 3. Stable dilated main pulmonary artery, suggesting chronic pulmonary arterial hypertension. 4. Aortic Atherosclerosis (ICD10-I70.0) and Emphysema (ICD10-J43.9).   Assessment/Plan: Encounter Diagnoses  Name Primary?   Dysphagia, unspecified type Yes   Globus  sensation [R09.A2]    Sensorineural hearing loss (SNHL) of both ears    Chronic GERD    Chronic nasal congestion    Environmental and seasonal allergies    Post-nasal drip     Assessment and Plan    Chronic Dysphagia with pills and sensation of numbness on the left side of her throat.  Reports sensation of no swallowing on the left side of the throat for 12-13 months. No history of stroke or major risk factors. No choking or coughing when eating or drinking. Physical exam and scope exams were unremarkable. Differential diagnosis includes oropharyngeal vs esophageal dysphagia and untreated GERD LPR. Explained that a swallow study will evaluate the throat and esophagus. Discussed that untreated reflux can cause throat sensations like a lump or excessive mucus. If the swallow study is normal, no further action may be needed. Discussed potential benefits of trial of famotidine for reflux and lifestyle modifications to manage symptoms. - Order swallow study - esophagram and MBS - Prescribe famotidine 20 mg BID - Provide education on lifestyle modifications to manage reflux (avoid late meals, reduce caffeine, fried foods, and spicy  foods)  Chronic Nasal Congestion Reports nasal congestion, particularly after exposure to cold environments. No regular use of nasal sprays or allergy medications. Discussed potential benefits of Flonase and Zyrtec for managing symptoms. Recommended ocean spray or salt water spray for nasal dryness, especially during colder months when heating can dry nasal passages. - Prescribe Flonase nasal spray once daily 2 puffs b/l nares - Prescribe Zyrtec 10 mg at night - Recommend ocean spray or salt water spray for nasal dryness  GERD LPR  and globus sensation  - Famotidine 20 mg BID  - diet and lifestyle changes to minimize reflux   SNHL b/l  - annual audiogram - trial of hearing aids when ready (currently cost prohibitive for her)   - Schedule follow-up appointment  after swallow study results are available.      Ashok Croon, MD Otolaryngology [facility] Phone: [phone] Fax: [phone]    05/15/2023, 9:42 AM

## 2023-05-23 ENCOUNTER — Other Ambulatory Visit (HOSPITAL_COMMUNITY): Payer: Self-pay | Admitting: *Deleted

## 2023-05-23 DIAGNOSIS — R1310 Dysphagia, unspecified: Secondary | ICD-10-CM

## 2023-06-13 ENCOUNTER — Emergency Department (HOSPITAL_BASED_OUTPATIENT_CLINIC_OR_DEPARTMENT_OTHER): Payer: PPO

## 2023-06-13 ENCOUNTER — Other Ambulatory Visit: Payer: Self-pay

## 2023-06-13 ENCOUNTER — Inpatient Hospital Stay (HOSPITAL_BASED_OUTPATIENT_CLINIC_OR_DEPARTMENT_OTHER)
Admission: EM | Admit: 2023-06-13 | Discharge: 2023-06-16 | DRG: 435 | Disposition: A | Discharge status: Home / Self Care | Payer: PPO | Attending: Internal Medicine | Admitting: Internal Medicine

## 2023-06-13 DIAGNOSIS — G4733 Obstructive sleep apnea (adult) (pediatric): Secondary | ICD-10-CM | POA: Diagnosis not present

## 2023-06-13 DIAGNOSIS — E785 Hyperlipidemia, unspecified: Secondary | ICD-10-CM | POA: Diagnosis not present

## 2023-06-13 DIAGNOSIS — R7401 Elevation of levels of liver transaminase levels: Secondary | ICD-10-CM

## 2023-06-13 DIAGNOSIS — K219 Gastro-esophageal reflux disease without esophagitis: Secondary | ICD-10-CM | POA: Diagnosis present

## 2023-06-13 DIAGNOSIS — E119 Type 2 diabetes mellitus without complications: Secondary | ICD-10-CM | POA: Diagnosis not present

## 2023-06-13 DIAGNOSIS — K3189 Other diseases of stomach and duodenum: Secondary | ICD-10-CM | POA: Diagnosis not present

## 2023-06-13 DIAGNOSIS — Z803 Family history of malignant neoplasm of breast: Secondary | ICD-10-CM

## 2023-06-13 DIAGNOSIS — Z833 Family history of diabetes mellitus: Secondary | ICD-10-CM | POA: Diagnosis not present

## 2023-06-13 DIAGNOSIS — G4730 Sleep apnea, unspecified: Secondary | ICD-10-CM | POA: Diagnosis present

## 2023-06-13 DIAGNOSIS — I509 Heart failure, unspecified: Secondary | ICD-10-CM | POA: Diagnosis not present

## 2023-06-13 DIAGNOSIS — Z8249 Family history of ischemic heart disease and other diseases of the circulatory system: Secondary | ICD-10-CM

## 2023-06-13 DIAGNOSIS — K449 Diaphragmatic hernia without obstruction or gangrene: Secondary | ICD-10-CM | POA: Diagnosis not present

## 2023-06-13 DIAGNOSIS — Z87891 Personal history of nicotine dependence: Secondary | ICD-10-CM | POA: Diagnosis not present

## 2023-06-13 DIAGNOSIS — C259 Malignant neoplasm of pancreas, unspecified: Principal | ICD-10-CM | POA: Diagnosis present

## 2023-06-13 DIAGNOSIS — Z79899 Other long term (current) drug therapy: Secondary | ICD-10-CM

## 2023-06-13 DIAGNOSIS — Z853 Personal history of malignant neoplasm of breast: Secondary | ICD-10-CM

## 2023-06-13 DIAGNOSIS — E039 Hypothyroidism, unspecified: Secondary | ICD-10-CM | POA: Diagnosis present

## 2023-06-13 DIAGNOSIS — K2990 Gastroduodenitis, unspecified, without bleeding: Secondary | ICD-10-CM | POA: Diagnosis not present

## 2023-06-13 DIAGNOSIS — K8689 Other specified diseases of pancreas: Principal | ICD-10-CM | POA: Diagnosis present

## 2023-06-13 DIAGNOSIS — B9681 Helicobacter pylori [H. pylori] as the cause of diseases classified elsewhere: Secondary | ICD-10-CM

## 2023-06-13 DIAGNOSIS — K838 Other specified diseases of biliary tract: Secondary | ICD-10-CM | POA: Diagnosis not present

## 2023-06-13 DIAGNOSIS — K2970 Gastritis, unspecified, without bleeding: Secondary | ICD-10-CM | POA: Diagnosis present

## 2023-06-13 DIAGNOSIS — I110 Hypertensive heart disease with heart failure: Secondary | ICD-10-CM | POA: Diagnosis not present

## 2023-06-13 DIAGNOSIS — Z888 Allergy status to other drugs, medicaments and biological substances status: Secondary | ICD-10-CM

## 2023-06-13 DIAGNOSIS — I10 Essential (primary) hypertension: Secondary | ICD-10-CM | POA: Diagnosis not present

## 2023-06-13 DIAGNOSIS — K2289 Other specified disease of esophagus: Secondary | ICD-10-CM | POA: Diagnosis not present

## 2023-06-13 DIAGNOSIS — K31A11 Gastric intestinal metaplasia without dysplasia, involving the antrum: Secondary | ICD-10-CM | POA: Diagnosis not present

## 2023-06-13 DIAGNOSIS — K2950 Unspecified chronic gastritis without bleeding: Secondary | ICD-10-CM | POA: Diagnosis not present

## 2023-06-13 DIAGNOSIS — Z9011 Acquired absence of right breast and nipple: Secondary | ICD-10-CM | POA: Diagnosis not present

## 2023-06-13 DIAGNOSIS — Z7984 Long term (current) use of oral hypoglycemic drugs: Secondary | ICD-10-CM

## 2023-06-13 DIAGNOSIS — R935 Abnormal findings on diagnostic imaging of other abdominal regions, including retroperitoneum: Secondary | ICD-10-CM | POA: Diagnosis not present

## 2023-06-13 DIAGNOSIS — N3000 Acute cystitis without hematuria: Secondary | ICD-10-CM | POA: Diagnosis not present

## 2023-06-13 DIAGNOSIS — I428 Other cardiomyopathies: Secondary | ICD-10-CM | POA: Diagnosis present

## 2023-06-13 DIAGNOSIS — K222 Esophageal obstruction: Secondary | ICD-10-CM

## 2023-06-13 DIAGNOSIS — Z9049 Acquired absence of other specified parts of digestive tract: Secondary | ICD-10-CM | POA: Diagnosis not present

## 2023-06-13 DIAGNOSIS — N2889 Other specified disorders of kidney and ureter: Secondary | ICD-10-CM | POA: Diagnosis not present

## 2023-06-13 DIAGNOSIS — I700 Atherosclerosis of aorta: Secondary | ICD-10-CM | POA: Diagnosis not present

## 2023-06-13 DIAGNOSIS — K819 Cholecystitis, unspecified: Secondary | ICD-10-CM | POA: Diagnosis not present

## 2023-06-13 DIAGNOSIS — K831 Obstruction of bile duct: Secondary | ICD-10-CM | POA: Diagnosis not present

## 2023-06-13 DIAGNOSIS — K31A12 Gastric intestinal metaplasia without dysplasia, involving the body (corpus): Secondary | ICD-10-CM | POA: Diagnosis not present

## 2023-06-13 DIAGNOSIS — Z96643 Presence of artificial hip joint, bilateral: Secondary | ICD-10-CM | POA: Diagnosis not present

## 2023-06-13 DIAGNOSIS — K31A15 Gastric intestinal metaplasia without dysplasia, involving multiple sites: Secondary | ICD-10-CM | POA: Diagnosis not present

## 2023-06-13 DIAGNOSIS — R109 Unspecified abdominal pain: Secondary | ICD-10-CM | POA: Diagnosis not present

## 2023-06-13 DIAGNOSIS — C250 Malignant neoplasm of head of pancreas: Secondary | ICD-10-CM | POA: Diagnosis present

## 2023-06-13 HISTORY — DX: Type 2 diabetes mellitus without complications: E11.9

## 2023-06-13 LAB — COMPREHENSIVE METABOLIC PANEL
ALT: 359 U/L — ABNORMAL HIGH (ref 0–44)
AST: 213 U/L — ABNORMAL HIGH (ref 15–41)
Albumin: 4.2 g/dL (ref 3.5–5.0)
Alkaline Phosphatase: 526 U/L — ABNORMAL HIGH (ref 38–126)
Anion gap: 9 (ref 5–15)
BUN: 11 mg/dL (ref 8–23)
CO2: 25 mmol/L (ref 22–32)
Calcium: 10 mg/dL (ref 8.9–10.3)
Chloride: 103 mmol/L (ref 98–111)
Creatinine, Ser: 0.97 mg/dL (ref 0.44–1.00)
GFR, Estimated: >60 mL/min (ref >60)
Glucose, Bld: 200 mg/dL — ABNORMAL HIGH (ref 70–99)
Potassium: 4.4 mmol/L (ref 3.5–5.1)
Sodium: 137 mmol/L (ref 135–145)
Total Bilirubin: 6.1 mg/dL — ABNORMAL HIGH (ref <1.2)
Total Protein: 8.3 g/dL — ABNORMAL HIGH (ref 6.5–8.1)

## 2023-06-13 LAB — URINALYSIS, ROUTINE W REFLEX MICROSCOPIC
Glucose, UA: 250 mg/dL — AB
Hgb urine dipstick: NEGATIVE
Ketones, ur: NEGATIVE mg/dL
Nitrite: NEGATIVE
Protein, ur: NEGATIVE mg/dL
Specific Gravity, Urine: 1.008 (ref 1.005–1.030)
pH: 6 (ref 5.0–8.0)

## 2023-06-13 LAB — LIPASE, BLOOD: Lipase: 84 U/L — ABNORMAL HIGH (ref 11–51)

## 2023-06-13 LAB — CBC
HCT: 39.1 % (ref 36.0–46.0)
Hemoglobin: 12.9 g/dL (ref 12.0–15.0)
MCH: 30.6 pg (ref 26.0–34.0)
MCHC: 33 g/dL (ref 30.0–36.0)
MCV: 92.9 fL (ref 80.0–100.0)
Platelets: 195 10*3/uL (ref 150–400)
RBC: 4.21 MIL/uL (ref 3.87–5.11)
RDW: 15 % (ref 11.5–15.5)
WBC: 8.5 10*3/uL (ref 4.0–10.5)
nRBC: 0 % (ref 0.0–0.2)

## 2023-06-13 MED ORDER — SODIUM CHLORIDE 0.9 % IV SOLN
1.0000 g | Freq: Once | INTRAVENOUS | Status: AC
  Administered 2023-06-13: 1 g via INTRAVENOUS
  Filled 2023-06-13: qty 10

## 2023-06-13 MED ORDER — IOHEXOL 300 MG/ML  SOLN
100.0000 mL | Freq: Once | INTRAMUSCULAR | Status: AC | PRN
  Administered 2023-06-13: 100 mL via INTRAVENOUS

## 2023-06-13 NOTE — ED Triage Notes (Signed)
Pt POV reporting lower L side abd pain and brown urine since Sunday. Denies nvd or fever.

## 2023-06-13 NOTE — ED Provider Notes (Signed)
Adrienne [name] Provider Note   CSN: [id number] Arrival date & time: 06/13/23  1827     History  Chief Complaint  Patient presents with   Abdominal Pain    Adrienne [name] is a 77 y.o. female.  Patient is a 77 year old female with a history of hypertension, hyperlipidemia, CHF, prediabetes, prior breast cancer status postmastectomy who presents with abdominal discomfort.  She describes a vague discomfort in her abdomen it has been going on for the last several days.  She is also noted some brown color to her urine.  She had some constipation but took magnesium citrate and that improved.  She denies any fevers.  No nausea or vomiting.  No dysuria or burning on urination.  She is status postcholecystectomy.  She denies any recent Tylenol usage.       Home Medications Prior to Admission medications   Medication Sig Start Date End Date Taking? Authorizing Provider  amLODipine (NORVASC) 10 MG tablet Take 1 tablet by mouth once daily 04/25/23   Anabel Halon, MD  celecoxib (CELEBREX) 100 MG capsule TAKE 1 CAPSULE BY MOUTH TWICE DAILY AS NEEDED FOR MILD TO MODERATE PAIN 04/25/23   Anabel Halon, MD  cetirizine (ZYRTEC) 10 MG tablet Take 1 tablet (10 mg total) by mouth daily. 05/15/23   Ashok Croon, MD  Cholecalciferol (VITAMIN D-3) 125 MCG (5000 UT) TABS Take 2 tablets by mouth daily.    [provider]  famotidine (PEPCID) 20 MG tablet Take 1 tablet (20 mg total) by mouth 2 (two) times daily. 05/15/23   Ashok Croon, MD  fluticasone (FLONASE) 50 MCG/ACT nasal spray Place 2 sprays into both nostrils daily. 05/15/23   Ashok Croon, MD  losartan (COZAAR) 100 MG tablet Take 1 tablet by mouth once daily 05/05/23   Anabel Halon, MD  metFORMIN (GLUCOPHAGE-XR) 500 MG 24 hr tablet Take 1 tablet (500 mg total) by mouth daily with breakfast. 02/14/23   Anabel Halon, MD  sodium chloride (OCEAN) 0.65 % SOLN nasal spray Place 1  spray into both nostrils as needed. 05/15/23   Ashok Croon, MD  thyroid (NP THYROID) 90 MG tablet Take 1 tablet (90 mg total) by mouth daily. 02/14/23   Anabel Halon, MD  triamcinolone cream (KENALOG) 0.1 % Apply 1 Application topically 2 (two) times daily. 02/13/23   Anabel Halon, MD      Allergies    Lisinopril, Statins, and Atorvastatin    Review of Systems   Review of Systems  Constitutional:  Negative for chills, diaphoresis, fatigue and fever.  HENT:  Negative for congestion, rhinorrhea and sneezing.   Eyes: Negative.   Respiratory:  Negative for cough, chest tightness and shortness of breath.   Cardiovascular:  Negative for chest pain and leg swelling.  Gastrointestinal:  Positive for abdominal pain and constipation. Negative for blood in stool, diarrhea, nausea and vomiting.  Genitourinary:  Negative for difficulty urinating, flank pain, frequency and hematuria.  Musculoskeletal:  Negative for arthralgias and back pain.  Skin:  Negative for rash.  Neurological:  Negative for dizziness, speech difficulty, weakness, numbness and headaches.    Physical Exam Updated Vital Signs BP (!) 179/101 (BP Location: Left Arm)   Pulse 61   Temp 98 F (36.7 C) (Oral)   Resp 18   Ht 5\' 3"  (1.6 m)   Wt 79.4 kg   SpO2 100%   BMI 31.00 kg/m  Physical Exam Constitutional:  Appearance: She is well-developed.  HENT:     Head: Normocephalic and atraumatic.  Eyes:     General: Scleral icterus present.     Pupils: Pupils are equal, round, and reactive to light.  Cardiovascular:     Rate and Rhythm: Normal rate and regular rhythm.     Heart sounds: Normal heart sounds.  Pulmonary:     Effort: Pulmonary effort is normal. No respiratory distress.     Breath sounds: Normal breath sounds. No wheezing or rales.  Chest:     Chest wall: No tenderness.  Abdominal:     General: Bowel sounds are normal.     Palpations: Abdomen is soft.     Tenderness: There is no abdominal  tenderness. There is no guarding or rebound.  Musculoskeletal:        General: Normal range of motion.     Cervical back: Normal range of motion and neck supple.  Lymphadenopathy:     Cervical: No cervical adenopathy.  Skin:    General: Skin is warm and dry.     Findings: No rash.  Neurological:     Mental Status: She is alert and oriented to person, place, and time.     ED Results / Procedures / Treatments   Labs (all labs ordered are listed, but only abnormal results are displayed) Labs Reviewed  LIPASE, BLOOD - Abnormal; Notable for the following components:      Result Value   Lipase 84 (*)    All other components within normal limits  COMPREHENSIVE METABOLIC PANEL - Abnormal; Notable for the following components:   Glucose, Bld 200 (*)    Total Protein 8.3 (*)    AST 213 (*)    ALT 359 (*)    Alkaline Phosphatase 526 (*)    Total Bilirubin 6.1 (*)    All other components within normal limits  URINALYSIS, ROUTINE W REFLEX MICROSCOPIC - Abnormal; Notable for the following components:   Glucose, UA 250 (*)    Bilirubin Urine SMALL (*)    Leukocytes,Ua SMALL (*)    Bacteria, UA MANY (*)    All other components within normal limits  URINE CULTURE  CBC  HEPATITIS PANEL, ACUTE    EKG None  Radiology CT ABDOMEN PELVIS W CONTRAST Result Date: 06/13/2023 CLINICAL DATA:  Abdominal pain, acute, nonlocalized. Left lower side abdominal pain and brown urine since Sunday. EXAM: CT ABDOMEN AND PELVIS WITH CONTRAST TECHNIQUE: Multidetector CT imaging of the abdomen and pelvis was performed using the standard protocol following bolus administration of intravenous contrast. RADIATION DOSE REDUCTION: This exam was performed according to the departmental dose-optimization program which includes automated exposure control, adjustment of the mA and/or kV according to patient size and/or use of iterative reconstruction technique. CONTRAST:  OMNIPAQUE IOHEXOL 300 MG/ML  SOLN  COMPARISON:  11/13/2016. FINDINGS: Lower chest: Heart is mildly enlarged. Mild atelectasis or scarring is present at the lung bases. Hepatobiliary: No focal liver abnormality is seen. Status post cholecystectomy. Intrahepatic and extrahepatic biliary ductal dilatation are noted. The common bile duct measures up to 1.6 cm. Pancreas: There is an ill-defined hypodense region in the head of the pancreas measuring 2.9 x 2.2 cm. There is dilatation of the pancreatic duct up to 1.2 cm distal to this region. No surrounding inflammatory changes are seen. Spleen: Normal in size without focal abnormality. Adrenals/Urinary Tract: The adrenal glands are within normal limits. The kidneys enhance symmetrically. 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 The right kidney is  pelvic in location. No renal calculus or hydronephrosis is seen. The visualized portion of the urinary bladder is within normal limits. The bladder is not well seen due to streak hardware artifact. Stomach/Bowel: A small hiatal hernia is noted. Stomach is within normal limits. Appendix is not seen. No evidence of bowel wall thickening, distention, or inflammatory changes. No free air or pneumatosis is seen. A right inguinal hernia is noted containing nonobstructed small bowel. Vascular/Lymphatic: Aortic atherosclerosis. No enlarged abdominal or pelvic lymph nodes. Reproductive: Status post hysterectomy. No adnexal masses. Other: No abdominopelvic ascites. Musculoskeletal: Total hip arthroplasty changes are noted bilaterally. Degenerative changes are present in the thoracolumbar spine. No acute osseous abnormality is seen. IMPRESSION: 1. No acute process to explain reported left lower quadrant abdominal pain. 2. Vague hypodense region in the head of the pancreas with biliary and pancreatic ductal dilatation, concerning for possible pancreatic adenocarcinoma. MRI with pancreatic protocol and MRCP are recommended for further evaluation. 3. Small hiatal  hernia. 4. Right inguinal hernia containing nonobstructed small bowel. 5. Aortic atherosclerosis. Electronically Signed   By: Thornell Sartorius M.D.   On: 06/13/2023 21:50    Procedures Procedures    Medications Ordered in ED Medications  iohexol (OMNIPAQUE) 300 MG/ML solution 100 mL (100 mLs Intravenous Contrast Given 06/13/23 2117)  cefTRIAXone (ROCEPHIN) 1 g in sodium chloride 0.9 % 100 mL IVPB (0 g Intravenous Stopped 06/13/23 2247)    ED Course/ Medical Decision Making/ A&P                                 Medical Decision Making Amount and/or Complexity of Data Reviewed Labs: ordered. Radiology: ordered.  Risk Prescription drug management. Decision regarding hospitalization.   Patient is a 77 year old female who presents with some vague abdominal discomfort.  She is noted to have some scleral icterus.  Her LFTs are elevated and her bilirubin is 6.  CT scan shows concerns for pancreatic mass.  Will plan admission for MRI/MRCP.  Spoke with Dr. Margo Aye who will admit the patient.  I also messaged [facility] to put the patient on their consult list.  It appears that she has been seen in their group in the past.  Final Clinical Impression(s) / ED Diagnoses Final diagnoses:  Pancreatic mass  Transaminitis    Rx / DC Orders ED Discharge Orders     None         Rolan Bucco, MD 06/13/23 2251

## 2023-06-13 NOTE — ED Notes (Signed)
Pt report called to Renita RN verbalized complete understanding of Pt plan of care and current condition, denies questions at this time.

## 2023-06-14 ENCOUNTER — Encounter (HOSPITAL_COMMUNITY): Payer: Self-pay | Admitting: Internal Medicine

## 2023-06-14 ENCOUNTER — Inpatient Hospital Stay (HOSPITAL_COMMUNITY): Payer: PPO | Admitting: Anesthesiology

## 2023-06-14 ENCOUNTER — Inpatient Hospital Stay (HOSPITAL_COMMUNITY): Payer: PPO

## 2023-06-14 ENCOUNTER — Encounter (HOSPITAL_COMMUNITY)
Admission: EM | Disposition: A | Discharge status: Home / Self Care | Payer: Self-pay | Source: Home / Self Care | Attending: Family Medicine

## 2023-06-14 DIAGNOSIS — K31A15 Gastric intestinal metaplasia without dysplasia, involving multiple sites: Secondary | ICD-10-CM | POA: Diagnosis not present

## 2023-06-14 DIAGNOSIS — K8689 Other specified diseases of pancreas: Secondary | ICD-10-CM

## 2023-06-14 DIAGNOSIS — G4733 Obstructive sleep apnea (adult) (pediatric): Secondary | ICD-10-CM | POA: Diagnosis not present

## 2023-06-14 DIAGNOSIS — K838 Other specified diseases of biliary tract: Secondary | ICD-10-CM | POA: Diagnosis not present

## 2023-06-14 DIAGNOSIS — B9681 Helicobacter pylori [H. pylori] as the cause of diseases classified elsewhere: Secondary | ICD-10-CM | POA: Diagnosis not present

## 2023-06-14 DIAGNOSIS — K222 Esophageal obstruction: Secondary | ICD-10-CM | POA: Diagnosis not present

## 2023-06-14 DIAGNOSIS — K2950 Unspecified chronic gastritis without bleeding: Secondary | ICD-10-CM

## 2023-06-14 DIAGNOSIS — E039 Hypothyroidism, unspecified: Secondary | ICD-10-CM | POA: Diagnosis not present

## 2023-06-14 DIAGNOSIS — K2289 Other specified disease of esophagus: Secondary | ICD-10-CM | POA: Diagnosis not present

## 2023-06-14 DIAGNOSIS — K2970 Gastritis, unspecified, without bleeding: Secondary | ICD-10-CM

## 2023-06-14 DIAGNOSIS — K831 Obstruction of bile duct: Secondary | ICD-10-CM | POA: Diagnosis not present

## 2023-06-14 HISTORY — PX: MALONEY DILATION: SHX5535

## 2023-06-14 HISTORY — PX: ERCP: SHX5425

## 2023-06-14 HISTORY — PX: ESOPHAGOGASTRODUODENOSCOPY (EGD) WITH PROPOFOL: SHX5813

## 2023-06-14 HISTORY — PX: BIOPSY: SHX5522

## 2023-06-14 HISTORY — PX: PANCREATIC STENT PLACEMENT: SHX5539

## 2023-06-14 LAB — COMPREHENSIVE METABOLIC PANEL
ALT: 304 U/L — ABNORMAL HIGH (ref 0–44)
AST: 208 U/L — ABNORMAL HIGH (ref 15–41)
Albumin: 3.3 g/dL — ABNORMAL LOW (ref 3.5–5.0)
Alkaline Phosphatase: 419 U/L — ABNORMAL HIGH (ref 38–126)
Anion gap: 9 (ref 5–15)
BUN: 10 mg/dL (ref 8–23)
CO2: 23 mmol/L (ref 22–32)
Calcium: 9.3 mg/dL (ref 8.9–10.3)
Chloride: 107 mmol/L (ref 98–111)
Creatinine, Ser: 0.79 mg/dL (ref 0.44–1.00)
GFR, Estimated: >60 mL/min (ref >60)
Glucose, Bld: 161 mg/dL — ABNORMAL HIGH (ref 70–99)
Potassium: 3.6 mmol/L (ref 3.5–5.1)
Sodium: 139 mmol/L (ref 135–145)
Total Bilirubin: 6.7 mg/dL — ABNORMAL HIGH (ref <1.2)
Total Protein: 7 g/dL (ref 6.5–8.1)

## 2023-06-14 LAB — CBC
HCT: 34.2 % — ABNORMAL LOW (ref 36.0–46.0)
Hemoglobin: 11.6 g/dL — ABNORMAL LOW (ref 12.0–15.0)
MCH: 31.8 pg (ref 26.0–34.0)
MCHC: 33.9 g/dL (ref 30.0–36.0)
MCV: 93.7 fL (ref 80.0–100.0)
Platelets: 202 10*3/uL (ref 150–400)
RBC: 3.65 MIL/uL — ABNORMAL LOW (ref 3.87–5.11)
RDW: 15 % (ref 11.5–15.5)
WBC: 7 10*3/uL (ref 4.0–10.5)
nRBC: 0 % (ref 0.0–0.2)

## 2023-06-14 LAB — HEPATITIS PANEL, ACUTE
HCV Ab: NONREACTIVE
Hep A IgM: NONREACTIVE
Hep B C IgM: NONREACTIVE
Hepatitis B Surface Ag: NONREACTIVE

## 2023-06-14 LAB — PROTIME-INR
INR: 1 (ref 0.8–1.2)
Prothrombin Time: 13 s (ref 11.4–15.2)

## 2023-06-14 SURGERY — ERCP, WITH INTERVENTION IF INDICATED
Anesthesia: General

## 2023-06-14 MED ORDER — ACETAMINOPHEN 650 MG RE SUPP: 650.0000 mg | Freq: Four times a day (QID) | RECTAL | Status: DC | PRN

## 2023-06-14 MED ORDER — ACETAMINOPHEN 325 MG PO TABS: 650.0000 mg | ORAL_TABLET | Freq: Four times a day (QID) | ORAL | Status: DC | PRN

## 2023-06-14 MED ORDER — ONDANSETRON HCL 4 MG/2ML IJ SOLN
INTRAMUSCULAR | Status: DC | PRN
  Administered 2023-06-14: 4 mg via INTRAVENOUS

## 2023-06-14 MED ORDER — DEXAMETHASONE SODIUM PHOSPHATE 10 MG/ML IJ SOLN
INTRAMUSCULAR | Status: DC | PRN
  Administered 2023-06-14: 5 mg via INTRAVENOUS

## 2023-06-14 MED ORDER — OXYCODONE HCL 5 MG PO TABS: 5.0000 mg | ORAL_TABLET | Freq: Once | ORAL | Status: DC | PRN

## 2023-06-14 MED ORDER — MIDAZOLAM HCL 2 MG/2ML IJ SOLN
0.5000 mg | Freq: Once | INTRAMUSCULAR | Status: DC | PRN
Start: 2023-06-14 — End: 2023-06-14

## 2023-06-14 MED ORDER — MEPERIDINE HCL 50 MG/ML IJ SOLN
6.2500 mg | INTRAMUSCULAR | Status: DC | PRN
Start: 2023-06-14 — End: 2023-06-14

## 2023-06-14 MED ORDER — LOSARTAN POTASSIUM 50 MG PO TABS
100.0000 mg | ORAL_TABLET | Freq: Every day | ORAL | Status: DC
  Administered 2023-06-15 – 2023-06-16 (×2): 100 mg via ORAL
  Filled 2023-06-14 (×2): qty 2

## 2023-06-14 MED ORDER — OXYCODONE HCL 5 MG/5ML PO SOLN
5.0000 mg | Freq: Once | ORAL | Status: DC | PRN
  Filled 2023-06-14: qty 5

## 2023-06-14 MED ORDER — DICLOFENAC SUPPOSITORY 100 MG: 100.0000 mg | Freq: Once | RECTAL | Status: DC

## 2023-06-14 MED ORDER — OXYCODONE HCL 5 MG PO TABS: 5.0000 mg | ORAL_TABLET | ORAL | Status: DC | PRN

## 2023-06-14 MED ORDER — AMLODIPINE BESYLATE 10 MG PO TABS
10.0000 mg | ORAL_TABLET | Freq: Every day | ORAL | Status: DC
  Administered 2023-06-15 – 2023-06-16 (×2): 10 mg via ORAL
  Filled 2023-06-14 (×2): qty 1

## 2023-06-14 MED ORDER — ONDANSETRON HCL 4 MG PO TABS: 4.0000 mg | ORAL_TABLET | Freq: Four times a day (QID) | ORAL | Status: DC | PRN

## 2023-06-14 MED ORDER — PROPOFOL 10 MG/ML IV BOLUS
INTRAVENOUS | Status: DC | PRN
  Administered 2023-06-14: 50 mg via INTRAVENOUS
  Administered 2023-06-14: 100 mg via INTRAVENOUS

## 2023-06-14 MED ORDER — EPHEDRINE SULFATE-NACL 50-0.9 MG/10ML-% IV SOSY
PREFILLED_SYRINGE | INTRAVENOUS | Status: DC | PRN
  Administered 2023-06-14: 5 mg via INTRAVENOUS

## 2023-06-14 MED ORDER — FENTANYL CITRATE (PF) 100 MCG/2ML IJ SOLN: 25.0000 ug | INTRAMUSCULAR | Status: DC | PRN

## 2023-06-14 MED ORDER — GLUCAGON HCL RDNA (DIAGNOSTIC) 1 MG IJ SOLR
INTRAMUSCULAR | Status: AC
  Filled 2023-06-14: qty 1

## 2023-06-14 MED ORDER — DICLOFENAC SUPPOSITORY 100 MG
RECTAL | Status: DC | PRN
  Administered 2023-06-14: 100 mg via RECTAL

## 2023-06-14 MED ORDER — ENOXAPARIN SODIUM 40 MG/0.4ML IJ SOSY: 40.0000 mg | PREFILLED_SYRINGE | INTRAMUSCULAR | Status: DC

## 2023-06-14 MED ORDER — ROCURONIUM BROMIDE 100 MG/10ML IV SOLN
INTRAVENOUS | Status: DC | PRN
  Administered 2023-06-14: 60 mg via INTRAVENOUS

## 2023-06-14 MED ORDER — SUGAMMADEX SODIUM 200 MG/2ML IV SOLN
INTRAVENOUS | Status: DC | PRN
  Administered 2023-06-14: 200 mg via INTRAVENOUS

## 2023-06-14 MED ORDER — GADOBUTROL 1 MMOL/ML IV SOLN
7.0000 mL | Freq: Once | INTRAVENOUS | Status: AC | PRN
  Administered 2023-06-14: 7 mL via INTRAVENOUS

## 2023-06-14 MED ORDER — FENTANYL CITRATE (PF) 100 MCG/2ML IJ SOLN
INTRAMUSCULAR | Status: AC
  Filled 2023-06-14: qty 2

## 2023-06-14 MED ORDER — LACTATED RINGERS IV SOLN: INTRAVENOUS | Status: DC | PRN

## 2023-06-14 MED ORDER — PIPERACILLIN-TAZOBACTAM 3.375 G IVPB
3.3750 g | Freq: Three times a day (TID) | INTRAVENOUS | Status: DC
  Administered 2023-06-14 – 2023-06-16 (×8): 3.375 g via INTRAVENOUS
  Filled 2023-06-14 (×8): qty 50

## 2023-06-14 MED ORDER — BOOST / RESOURCE BREEZE PO LIQD CUSTOM
1.0000 | Freq: Three times a day (TID) | ORAL | Status: DC
  Administered 2023-06-15 (×2): 1 via ORAL

## 2023-06-14 MED ORDER — ONDANSETRON HCL 4 MG/2ML IJ SOLN
4.0000 mg | Freq: Four times a day (QID) | INTRAMUSCULAR | Status: DC | PRN
  Administered 2023-06-14: 4 mg via INTRAVENOUS
  Filled 2023-06-14: qty 2

## 2023-06-14 MED ORDER — PROPOFOL 10 MG/ML IV BOLUS
INTRAVENOUS | Status: AC
  Filled 2023-06-14: qty 20

## 2023-06-14 MED ORDER — ENOXAPARIN SODIUM 40 MG/0.4ML IJ SOSY
40.0000 mg | PREFILLED_SYRINGE | INTRAMUSCULAR | Status: DC
  Administered 2023-06-14: 40 mg via SUBCUTANEOUS
  Filled 2023-06-14 (×2): qty 0.4

## 2023-06-14 MED ORDER — DICLOFENAC SUPPOSITORY 100 MG
RECTAL | Status: AC
  Filled 2023-06-14: qty 1

## 2023-06-14 MED ORDER — GLUCAGON HCL RDNA (DIAGNOSTIC) 1 MG IJ SOLR
INTRAMUSCULAR | Status: DC | PRN
  Administered 2023-06-14: .5 mg via INTRAVENOUS

## 2023-06-14 MED ORDER — FENTANYL CITRATE (PF) 100 MCG/2ML IJ SOLN
INTRAMUSCULAR | Status: DC | PRN
  Administered 2023-06-14: 100 ug via INTRAVENOUS

## 2023-06-14 MED ORDER — LIDOCAINE HCL (CARDIAC) PF 100 MG/5ML IV SOSY
PREFILLED_SYRINGE | INTRAVENOUS | Status: DC | PRN
  Administered 2023-06-14: 60 mg via INTRAVENOUS

## 2023-06-14 MED ORDER — LACTATED RINGERS IV SOLN
INTRAVENOUS | Status: AC
Start: 2023-06-14 — End: 2023-06-15

## 2023-06-14 MED ORDER — FAMOTIDINE 20 MG PO TABS
20.0000 mg | ORAL_TABLET | Freq: Two times a day (BID) | ORAL | Status: DC
  Administered 2023-06-14 – 2023-06-16 (×5): 20 mg via ORAL
  Filled 2023-06-14 (×5): qty 1

## 2023-06-14 NOTE — Anesthesia Postprocedure Evaluation (Signed)
Anesthesia Post Note  Patient: Adrienne [name]  Procedure(s) Performed: ENDOSCOPIC RETROGRADE CHOLANGIOPANCREATOGRAPHY (ERCP) ESOPHAGOGASTRODUODENOSCOPY (EGD) WITH PROPOFOL MALONEY DILATION PANCREATIC STENT PLACEMENT BIOPSY     Patient location during evaluation: PACU Anesthesia Type: General Level of consciousness: awake and alert, patient cooperative and oriented Pain management: pain level controlled Vital Signs Assessment: post-procedure vital signs reviewed and stable Respiratory status: spontaneous breathing, nonlabored ventilation and respiratory function stable Cardiovascular status: blood pressure returned to baseline and stable Postop Assessment: no apparent nausea or vomiting Anesthetic complications: no   No notable events documented.  Last Vitals:  Vitals:   06/14/23 1114 06/14/23 1115  BP:  (!) 184/92  Pulse: 68 69  Resp: 16 (!) 21  Temp:    SpO2: 97% 95%    Last Pain:  Vitals:   06/14/23 1128  TempSrc:   PainSc: 0-No pain                 Hoover Grewe,E. Tytianna Greenley

## 2023-06-14 NOTE — Consult Note (Addendum)
Consultation  Referring Provider:     [facility] Primary Care Physician:  Anabel Halon, MD Primary Gastroenterologist:      Lavon Paganini Reason for Consultation:     jaundice, pancreatic mass     Impression / Plan:   Obstructive jaundice with a pancreatic mass and double duct sign (dilated bile and pancreatic ducts).  This is most consistent with pancreatic cancer.  Final reading of MRCP is pending though I have reviewed that.  Plan for ERCP with potential cytology brushings of the bile duct and stent placement to treat obstructive jaundice.  I have explained the risks benefits and indications, that include pancreatitis, bleeding, reactions to anesthesia, injury requiring surgery and other unforeseen possibilities.  She understands and agrees to proceed.  Further plans pending these results.  I canceled the order for Lovenox this morning and we can resume it after the ERCP.   Iva Boop, MD, [facility] See Loretha Stapler on call - gastroenterology for best contact person 06/14/2023 8:59 AM  11:23 AM  ERCP performed - had to dilate esophagus to pass the scope. Gastritis bx. Dilated pancreatic duct - pancreatic duct stent left. Unable to enter and stent bile duct. I called and explained findings to daughter [name] at patient's request and have reviewed w/ patient in recovery.  CA 19-9 ordered and will need to decide retry ERCP vs IR   Lovenox re-ordered  Iva Boop, MD, [facility]           HPI:   Adrienne [name] is a 77 y.o. female with a history of DCIS, hypertension, hypothyroidism, nonischemic cardiomyopathy, and type 2 diabetes mellitus.  She was admitted overnight because of jaundice, she noticed dark urine and her stomach felt sour.  She was evaluated in the emergency department where labs showed jaundice she was icteric, and a CT scan showed dilated pancreatic and biliary ducts and suggestion of a mass in the pancreas though it was ill-defined.  This was in  the head.  She has had an MRCP which I reviewed which shows a double duct sign as well.  Final reading of that is not yet out.  She has not had fever nausea or vomiting or significant dysphagia problems.  She has had some funny sensation of food moving down 1 side of the neck that ENT is evaluating.  She does not have pruritus.  Wt Readings from Last 3 Encounters:  06/13/23 79.4 kg  05/15/23 80.7 kg  04/22/23 82.1 kg   Last colonoscopy 12/18/2021 5 mm adenoma, also diverticulosis.  Previous polyps-3 adenomas 2019.Marland Kitchen  Past Medical History:  Diagnosis Date   Allergy    Ambulates with cane    straight cane   Arthritis    knee, back   CHF (congestive heart failure) (HCC)    Ductal carcinoma in situ of breast 12/2018   R Breast-mastectomy only   Gout    History of blood transfusion 1986   w/ Hysterectomy surgery   Hyperlipidemia    diet controlled - no meds   Hypertension    Hypothyroidism    Pelvic kidney    lower right pelvic kidney    SBO (small bowel obstruction) (HCC) 10/2016   surgery    Sleep apnea    Mild - no mask needed per sleep study   Smoker    quit smoking 2018   Type 2 diabetes mellitus (HCC)     Past Surgical History:  Procedure Laterality Date   ABDOMINAL HYSTERECTOMY  1986   COMPLETE-precancerous   APPENDECTOMY     pt states it was removed when gallbladder was removed.   AUGMENTATION MAMMAPLASTY     BACK SURGERY     lower back   BREAST BIOPSY Right 05/12/2019   times 2   BREAST LUMPECTOMY WITH RADIOACTIVE SEED LOCALIZATION Right 03/18/2019   Procedure: RIGHT BREAST LUMPECTOMY WITH RADIOACTIVE SEED LOCALIZATION;  Surgeon: Griselda Miner, MD;  Location: [facility];  Service: General;  Laterality: Right;   CHOLECYSTECTOMY     COLONOSCOPY  02/15/2008   Kaplan    COLONOSCOPY WITH PROPOFOL  02/27/2018   Dr.Nandigam   ECTOPIC PREGNANCY SURGERY     JOINT REPLACEMENT     Left hip total Dr. Lequita Halt 10-01-17   MASTECTOMY Right 07/26/2019   POLYPECTOMY      polypectomy-oropharynx     RE-EXCISION OF BREAST CANCER,SUPERIOR MARGINS Right 04/08/2019   Procedure: RE-EXCISION OF RIGHT BREAST ANTERIOR MARGINS;  Surgeon: Chevis Pretty III, MD;  Location: [facility];  Service: General;  Laterality: Right;   right knee meniscus     SBO  10/2016   small bowel obstruction   SIMPLE MASTECTOMY WITH AXILLARY SENTINEL NODE BIOPSY Right 07/26/2019   Procedure: RIGHT MASTECTOMY WITH SENTINEL NODE BIOPSY;  Surgeon: Griselda Miner, MD;  Location: [facility];  Service: General;  Laterality: Right;   TOTAL HIP ARTHROPLASTY Left 10/01/2017   Procedure: LEFT  TOTAL HIP ARTHROPLASTY ANTERIOR APPROACH;  Surgeon: Ollen Gross, MD;  Location: [facility];  Service: Orthopedics;  Laterality: Left;   TOTAL HIP ARTHROPLASTY Right 03/11/2018   Procedure: RIGHT TOTAL HIP ARTHROPLASTY ANTERIOR APPROACH;  Surgeon: Ollen Gross, MD;  Location: [facility];  Service: Orthopedics;  Laterality: Right;    UPPER GASTROINTESTINAL ENDOSCOPY      Family History  Problem Relation Age of Onset   Cancer Sister    Diabetes Brother    Diabetes Brother    Hypertension Other    Breast cancer Sister    Colon cancer Neg Hx    Colon polyps Neg Hx    Rectal cancer Neg Hx    Stomach cancer Neg Hx     Social History   Tobacco Use   Smoking status: Former    Current packs/day: 0.00    Average packs/day: 1 pack/day for 32.0 years (32.0 ttl pk-yrs)    Types: E-cigarettes, Cigarettes    Start date: 11/20/1984    Quit date: 11/20/2016    Years since quitting: 6.5   Smokeless tobacco: Never   Tobacco comments:    Smoker since 1975 has quit and started back several times!!,  Vaping Use   Vaping status: Former  Substance Use Topics   Alcohol use: No   Drug use: No    Prior to Admission medications   Medication Sig Start Date End Date Taking? Authorizing Provider  amLODipine (NORVASC) 10 MG tablet Take 1 tablet by mouth once daily 04/25/23  Yes Patel, Earlie Lou, MD  celecoxib  (CELEBREX) 100 MG capsule TAKE 1 CAPSULE BY MOUTH TWICE DAILY AS NEEDED FOR MILD TO MODERATE PAIN Patient taking differently: Take 100 mg by mouth every evening. 04/25/23  Yes Anabel Halon, MD  cetirizine (ZYRTEC) 10 MG tablet Take 1 tablet (10 mg total) by mouth daily. Patient taking differently: Take 10 mg by mouth daily as needed for allergies. 05/15/23  Yes Ashok Croon, MD  Cholecalciferol (VITAMIN D-3) 125 MCG (5000 UT) TABS Take 2 tablets by mouth daily.   Yes [provider]  fluticasone (FLONASE) 50 MCG/ACT nasal spray Place 2 sprays into both nostrils daily. 05/15/23  Yes Ashok Croon, MD  losartan (COZAAR) 100 MG tablet Take 1 tablet by mouth once daily 05/05/23  Yes Patel, Earlie Lou, MD  sodium chloride (OCEAN) 0.65 % SOLN nasal spray Place 1 spray into both nostrils as needed. 05/15/23  Yes Ashok Croon, MD  thyroid (NP THYROID) 90 MG tablet Take 1 tablet (90 mg total) by mouth daily. 02/14/23  Yes Anabel Halon, MD  famotidine (PEPCID) 20 MG tablet Take 1 tablet (20 mg total) by mouth 2 (two) times daily. Patient not taking: Reported on 06/14/2023 05/15/23   Ashok Croon, MD  metFORMIN (GLUCOPHAGE-XR) 500 MG 24 hr tablet Take 1 tablet (500 mg total) by mouth daily with breakfast. Patient not taking: Reported on 06/14/2023 02/14/23   Anabel Halon, MD    Current Facility-Administered Medications  Medication Dose Route Frequency Provider Last Rate Last Admin   acetaminophen (TYLENOL) tablet 650 mg  650 mg Oral Q6H PRN Alan Mulder, MD       Or   acetaminophen (TYLENOL) suppository 650 mg  650 mg Rectal Q6H PRN Alan Mulder, MD       amLODipine (NORVASC) tablet 10 mg  10 mg Oral Daily Dorrell, Robert, MD       famotidine (PEPCID) tablet 20 mg  20 mg Oral BID Alan Mulder, MD   20 mg at 06/14/23 0272   lactated ringers infusion   Intravenous Continuous Alan Mulder, MD 125 mL/hr at 06/14/23 0329 Infusion Verify at 06/14/23 0329   losartan  (COZAAR) tablet 100 mg  100 mg Oral Daily Dorrell, Molly Maduro, MD       ondansetron [facility]) tablet 4 mg  4 mg Oral Q6H PRN Alan Mulder, MD       Or   ondansetron (ZOFRAN) injection 4 mg  4 mg Intravenous Q6H PRN Dorrell, Robert, MD       oxyCODONE (Oxy IR/ROXICODONE) immediate release tablet 5 mg  5 mg Oral Q4H PRN Dorrell, Robert, MD       piperacillin-tazobactam (ZOSYN) IVPB 3.375 g  3.375 g Intravenous Q8H Poindexter, Leann T, RPH 12.5 mL/hr at 06/14/23 0331 3.375 g at 06/14/23 0331    Allergies as of 06/13/2023 - Review Complete 06/13/2023  Allergen Reaction Noted   Lisinopril Swelling 03/22/2016   Statins  10/10/2022   Atorvastatin Other (See Comments) 09/12/2016     Review of Systems:    This is positive for those things mentioned in the HPI. All other review of systems are negative.       Physical Exam:  Vital signs in last 24 hours: Temp:  [98 F (36.7 C)-98.5 F (36.9 C)] 98.5 F (36.9 C) (12/21 0443) Pulse Rate:  [48-66] 48 (12/21 0443) Resp:  [16-20] 18 (12/21 0443) BP: (147-203)/(65-101) 167/72 (12/21 0443) SpO2:  [97 %-100 %] 98 % (12/21 0443) Weight:  [79.4 kg] 79.4 kg (12/20 1835)    General:  Well-developed, well-nourished and in no acute distress Eyes:  icteric. ENT:   Mouth and posterior pharynx free of lesions. icteric mucosa Neck:   supple w/o thyromegaly or mass.  Lungs: Clear to auscultation bilaterally. Heart:  S1S2, no rubs, murmurs, gallops. Abdomen:  soft, non-tender, no hepatosplenomegaly, hernia, or mass and BS+.  Lymph:  no cervical or supraclavicular adenopathy. Extremities:   no edema Skin   no rash. Neuro:  A&O x 3.  Psych:  appropriate mood and  Affect.  Data Reviewed:   LAB RESULTS: Recent Labs    06/13/23 1837 06/14/23 0702  WBC 8.5 7.0  HGB 12.9 11.6*  HCT 39.1 34.2*  PLT 195 202   BMET Recent Labs    06/13/23 1837 06/14/23 0702  NA 137 139  K 4.4 3.6  CL 103 107  CO2 25 23  GLUCOSE 200* 161*  BUN 11 10   CREATININE 0.97 0.79  CALCIUM 10.0 9.3   LFT Recent Labs    06/14/23 0702  PROT 7.0  ALBUMIN 3.3*  AST 208*  ALT 304*  ALKPHOS 419*  BILITOT 6.7*   PT/INR Recent Labs    06/14/23 0702  LABPROT 13.0  INR 1.0   Lab Results  Component Value Date   LIPASE 84 (H) 06/13/2023     STUDIES: CT ABDOMEN PELVIS W CONTRAST Result Date: 06/13/2023 CLINICAL DATA:  Abdominal pain, acute, nonlocalized. Left lower side abdominal pain and brown urine since Sunday. EXAM: CT ABDOMEN AND PELVIS WITH CONTRAST TECHNIQUE: Multidetector CT imaging of the abdomen and pelvis was performed using the standard protocol following bolus administration of intravenous contrast. RADIATION DOSE REDUCTION: This exam was performed according to the departmental dose-optimization program which includes automated exposure control, adjustment of the mA and/or kV according to patient size and/or use of iterative reconstruction technique. CONTRAST:  OMNIPAQUE IOHEXOL 300 MG/ML  SOLN COMPARISON:  11/13/2016. FINDINGS: Lower chest: Heart is mildly enlarged. Mild atelectasis or scarring is present at the lung bases. Hepatobiliary: No focal liver abnormality is seen. Status post cholecystectomy. Intrahepatic and extrahepatic biliary ductal dilatation are noted. The common bile duct measures up to 1.6 cm. Pancreas: There is an ill-defined hypodense region in the head of the pancreas measuring 2.9 x 2.2 cm. There is dilatation of the pancreatic duct up to 1.2 cm distal to this region. No surrounding inflammatory changes are seen. Spleen: Normal in size without focal abnormality. Adrenals/Urinary Tract: The adrenal glands are within normal limits. The kidneys enhance symmetrically. 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 The right kidney is pelvic in location. No renal calculus or hydronephrosis is seen. The visualized portion of the urinary bladder is within normal limits. The bladder is not well seen due to streak  hardware artifact. Stomach/Bowel: A small hiatal hernia is noted. Stomach is within normal limits. Appendix is not seen. No evidence of bowel wall thickening, distention, or inflammatory changes. No free air or pneumatosis is seen. A right inguinal hernia is noted containing nonobstructed small bowel. Vascular/Lymphatic: Aortic atherosclerosis. No enlarged abdominal or pelvic lymph nodes. Reproductive: Status post hysterectomy. No adnexal masses. Other: No abdominopelvic ascites. Musculoskeletal: Total hip arthroplasty changes are noted bilaterally. Degenerative changes are present in the thoracolumbar spine. No acute osseous abnormality is seen. IMPRESSION: 1. No acute process to explain reported left lower quadrant abdominal pain. 2. Vague hypodense region in the head of the pancreas with biliary and pancreatic ductal dilatation, concerning for possible pancreatic adenocarcinoma. MRI with pancreatic protocol and MRCP are recommended for further evaluation. 3. Small hiatal hernia. 4. Right inguinal hernia containing nonobstructed small bowel. 5. Aortic atherosclerosis. Electronically Signed   By: Thornell Sartorius M.D.   On: 06/13/2023 21:50                Thanks   LOS: 1 day   @Farah Benish  Sena Slate, MD, [facility] @  06/14/2023, 8:59 AM

## 2023-06-14 NOTE — Anesthesia Procedure Notes (Signed)
Procedure Name: Intubation Date/Time: 06/14/2023 9:35 AM  Performed by: Burgess Estelle, CRNAPre-anesthesia Checklist: Patient identified, Emergency Drugs available, Suction available and Patient being monitored Patient Re-evaluated:Patient Re-evaluated prior to induction Oxygen Delivery Method: Circle system utilized Preoxygenation: Pre-oxygenation with 100% oxygen Induction Type: IV induction Ventilation: Mask ventilation without difficulty Laryngoscope Size: Mac and 3 Grade View: Grade I Tube type: Oral Tube size: 7.0 mm Number of attempts: 1 Airway Equipment and Method: Stylet and Oral airway Placement Confirmation: ETT inserted through vocal cords under direct vision, positive ETCO2 and breath sounds checked- equal and bilateral Secured at: 22 cm Tube secured with: Tape Dental Injury: Teeth and Oropharynx as per pre-operative assessment

## 2023-06-14 NOTE — Op Note (Signed)
[facility] Patient Name: Adrienne [name] Procedure Date: 06/14/2023 MRN: [id number] Attending MD: Iva Boop , MD, [id number] Date of Birth: [birth year] CSN: [id number] Age: 77 Admit Type: Inpatient Procedure:                ERCP Indications:              Jaundice, Tumor of the head of pancreas Providers:                Iva Boop, MD, Vita Erm, RN, Harrington Challenger,                            Technician Referring MD:              Medicines:                General Anesthesia, On continuous intermittent                            Zosyn; diclofenac 100 mg per rectum Complications:            No immediate complications. Estimated Blood Loss:     Estimated blood loss: none. Procedure:                Pre-Anesthesia Assessment:                           - Prior to the procedure, a History and Physical                            was performed, and patient medications and                            allergies were reviewed. The patient's tolerance of                            previous anesthesia was also reviewed. The risks                            and benefits of the procedure and the sedation                            options and risks were discussed with the patient.                            All questions were answered, and informed consent                            was obtained. Prior Anticoagulants: The patient has                            taken no anticoagulant or antiplatelet agents. ASA                            Grade Assessment: III - A patient with severe  systemic disease. After reviewing the risks and                            benefits, the patient was deemed in satisfactory                            condition to undergo the procedure.                           After obtaining informed consent, the scope was                            passed under direct vision. Throughout the                            procedure, the  patient's blood pressure, pulse, and                            oxygen saturations were monitored continuously. The                            [employer] D single use                            duodenoscope was introduced through the mouth, and                            used to inject contrast into and used to inject                            contrast into the ventral pancreatic duct. The                            [id number]-H190 [id number] Olympus endoscope was introduced                            through the mouth, and used to inject contrast into                            and to cannulate the ventral pancreatic duct. The                            ERCP was performed with difficulty due to                            challenging cannulation and challenging esophageal                            intubation because of abnormal patient anatomy.                            Successful completion of the procedure was aided by  esophageal dilation. The patient tolerated the                            procedure well. Scope In: Scope Out: Findings:      The scout film was normal. inoitial attempts to intubalte w/ exalt       duodenoscope not possible due to stenosis at UES. Gastroscope used to       inspect - inserted - normal esophagus except irregular Z-line, stomach       with mild gastritis of body and proximal antrum suspected and biopsied.       The duodenum was normal. I then retried Exalt scope - still would not       pass even with semi-left lateral position as before. Olympus       duodenoscope then used and it would not pass. 40 Fr Maloney dilator       passed and no heme. Mild mucosal disruption in the very proximal       esophagus. Then the Olympus duodenoscope wotld pass and it reached a       normal appearing major papilla with no bile seen. Multiple attempts to       cannulate with standard short-nose traction sphinctrerotome and .035        Jagwire resulted in pancreatic duct cannulation confirmed by gentle       contrast injection revealing partial pancreatogtram with very dilated       pancreatic duct. There was some ? filling defects but I think air vs       artifact/underfilling and not stones. I then tried the Revolution       sphincterotome and wire and again only able to cannulate pancreatic       duct. I placed a 4Fr 3 cm single pigtail no flap stent into the       pancreatic duct. I then attempted to cannulate the bile duct but there       was not enough space to really seat the sphincterotome well and The wire       went into pancreatic duct only still. I personally interpreted the       images. Impression:               - The examination was suspicious for a pancreatic                            tumor in the head of the pancreas. Pancreatic duct                            cannulated and partially filled - very dilated -                            underfilled by intent - given multiple wire                            passages and contrast injection a 4 Fr 3 cm no flap                            singel pigtail stent was placed.                           -  Attempts at a cholangiogram failed.                           - Esophageal stenosis. Dilated 40 fr Maloney w/                            mild mucosal disruption - she had been c/o mild                            dysphagia and had seen ENT                           - Mucosal changes suspicious for gastritis.                            Biopsied. Moderate Sedation:      Not Applicable - Patient had care per Anesthesia. Recommendation:           - Clear liquid diet today and advance to solid food                            tomorrow if ok                           F/U labs/pathology                           Check CA 19-9                           Will need to determine next step - IR approach w/                            PBD vs repeat ERCP                            Depending upon next steps will need to recheck for                            passage of pancretic duct stent later via KUB Procedure Code(s):        --- Professional ---                           [phone], Endoscopic retrograde                            cholangiopancreatography (ERCP); with placement of                            endoscopic stent into biliary or pancreatic duct,                            including pre- and post-dilation and guide wire                            passage, when performed, including  sphincterotomy,                            when performed, each stent                           43239, Esophagogastroduodenoscopy, flexible,                            transoral; with biopsy, single or multiple                           43450, Dilation of esophagus, by unguided sound or                            bougie, single or multiple passes                           74328, Endoscopic catheterization of the biliary                            ductal system, radiological supervision and                            interpretation Diagnosis Code(s):        --- Professional ---                           K22.2, Esophageal obstruction                           K31.89, Other diseases of stomach and duodenum                           R17, Unspecified jaundice                           D49.0, Neoplasm of unspecified behavior of                            digestive system CPT copyright 2022 [employer]. All rights reserved. The codes documented in this report are preliminary and upon coder review may  be revised to meet current compliance requirements. Iva Boop, MD 06/14/2023 11:11:06 AM This report has been signed electronically. Number of Addenda: 0

## 2023-06-14 NOTE — Progress Notes (Addendum)
Subjective: Patient admitted this morning, see detailed H&P by Dr Avie Arenas a 77 y.o. female with medical history significant of CHF, hyperlipidemia, hypertension who presents to the emergency department due to abdominal discomfort.  Patient has been having ongoing abdominal pain and changes in her urination over the last week.  She presented to outside hospital where she was found to be afebrile and hemodynamically stable.  Labs were obtained which showed total bilirubin 6.1, AST 213, ALT 359, WBC 8.5, hemoglobin 12.9, urinalysis negative for infection.  CT abdomen pelvis showed hypodense region in head of pancreas with ductal dilation concerning for malignancy.  Patient was transferred for MRCP   Vitals:   06/14/23 0443 06/14/23 0915  BP: (!) 167/72 (!) 194/93  Pulse: (!) 48   Resp: 18 16  Temp: 98.5 F (36.9 C) (!) 97.2 F (36.2 C)  SpO2: 98% 100%      A/P Pancreatic mass Obstructive jaundice -MRCP showed vague masslike fullness of central pancreatic head, severe pancreatic ductal dilatation distally, finding concerning for pancreatic adenocarcinoma GI consulted, underwent ERCP today; stent placement pancreatic duct; unable to cannulate bile duct, cholangiogram failed -GI following, likely will need repeat ERCP -Follow CA 19-9    [name] [facility]

## 2023-06-14 NOTE — Anesthesia Preprocedure Evaluation (Addendum)
Anesthesia Evaluation  Patient identified by MRN, date of birth, ID band Patient awake    Reviewed: Allergy & Precautions, NPO status , Patient's Chart, lab work & pertinent test results  History of Anesthesia Complications Negative for: history of anesthetic complications  Airway Mallampati: II  TM Distance: >3 FB Neck ROM: Full    Dental  (+) Chipped, Dental Advisory Given   Pulmonary sleep apnea (does not use CPAP) , former smoker   breath sounds clear to auscultation       Cardiovascular hypertension, Pt. on medications (-) angina  Rhythm:Regular Rate:Normal  08/2022 ECHO: EF 45-50%, mildly decreased LVF, Grade 2 DD, normal RVF, mild MR   Neuro/Psych negative neurological ROS     GI/Hepatic ,GERD  Medicated and Controlled,,Elevated LFTs: hypodense region in head of pancreas with ductal dilation concerning for malignancy   Endo/Other  diabetes (glu 161), Oral Hypoglycemic AgentsHypothyroidism  BMI 31  Renal/GU negative Renal ROS     Musculoskeletal   Abdominal   Peds  Hematology negative hematology ROS (+)   Anesthesia Other Findings H/o breast cancer  Reproductive/Obstetrics                             Anesthesia Physical Anesthesia Plan  ASA: 3  Anesthesia Plan: General   Post-op Pain Management: Minimal or no pain anticipated   Induction: Intravenous  PONV Risk Score and Plan: 3 and Ondansetron, Dexamethasone and Treatment may vary due to age or medical condition  Airway Management Planned: Oral ETT  Additional Equipment: None  Intra-op Plan:   Post-operative Plan: Extubation in OR  Informed Consent: I have reviewed the patients History and Physical, chart, labs and discussed the procedure including the risks, benefits and alternatives for the proposed anesthesia with the patient or authorized representative who has indicated his/her understanding and acceptance.      Dental advisory given  Plan Discussed with: CRNA and Surgeon  Anesthesia Plan Comments:         Anesthesia Quick Evaluation

## 2023-06-14 NOTE — H&P (Signed)
History and Physical    Adrienne [name] ZOX:[id number] DOB: June 24, 1946 DOA: 06/13/2023  PCP: Anabel Halon, MD   Chief Complaint: abd pain  HPI: Adrienne [name] is a 77 y.o. female with medical history significant of CHF, hyperlipidemia, hypertension who presents to the emergency department due to abdominal discomfort.  Patient has been having ongoing abdominal pain and changes in her urination over the last week.  She presented to outside hospital where she was found to be afebrile and hemodynamically stable.  Labs were obtained which showed total bilirubin 6.1, AST 213, ALT 359, WBC 8.5, hemoglobin 12.9, urinalysis negative for infection.  CT abdomen pelvis showed hypodense region in head of pancreas with ductal dilation concerning for malignancy.  Patient was transferred for MRCP.  On evaluation she denied complaints.  She denied weight loss or night sweats.   Review of Systems: Review of Systems  Constitutional:  Negative for chills and fever.  HENT: Negative.    Eyes: Negative.   Respiratory: Negative.    Cardiovascular: Negative.   Gastrointestinal:  Positive for abdominal pain.  Genitourinary: Negative.   Musculoskeletal: Negative.   Skin: Negative.   All other systems reviewed and are negative.    As per HPI otherwise 10 point review of systems negative.   Allergies  Allergen Reactions   Lisinopril Swelling    Angioedema Angioedema Angioedema   Statins     Myalgias   Atorvastatin Other (See Comments)    Muscle aches   Muscle aches  Muscle aches     Past Medical History:  Diagnosis Date   Allergy    Ambulates with cane    straight cane   Arthritis    knee, back   CHF (congestive heart failure) (HCC)    Ductal carcinoma in situ of breast 12/2018   R Breast-mastectomy only   Gout    History of blood transfusion 1986   w/ Hysterectomy surgery   Hyperlipidemia    diet controlled - no meds   Hypertension    Hypothyroidism    Pelvic kidney     lower right pelvic kidney    Prediabetes    diet controlled - no meds   SBO (small bowel obstruction) (HCC) 10/2016   surgery    Sleep apnea    Mild - no mask needed per sleep study   Smoker    quit smoking 2018    Past Surgical History:  Procedure Laterality Date   ABDOMINAL HYSTERECTOMY  1986   COMPLETE-precancerous   APPENDECTOMY     pt states it was removed when gallbladder was removed.   AUGMENTATION MAMMAPLASTY     BACK SURGERY     lower back   BREAST BIOPSY Right 05/12/2019   times 2   BREAST LUMPECTOMY WITH RADIOACTIVE SEED LOCALIZATION Right 03/18/2019   Procedure: RIGHT BREAST LUMPECTOMY WITH RADIOACTIVE SEED LOCALIZATION;  Surgeon: Griselda Miner, MD;  Location: [facility] OR;  Service: General;  Laterality: Right;   CHOLECYSTECTOMY     COLONOSCOPY  02/15/2008   Kaplan    COLONOSCOPY WITH PROPOFOL  02/27/2018   Dr.Nandigam   ECTOPIC PREGNANCY SURGERY     JOINT REPLACEMENT     Left hip total Dr. Lequita Halt 10-01-17   MASTECTOMY Right 07/26/2019   POLYPECTOMY     polypectomy-oropharynx     RE-EXCISION OF BREAST CANCER,SUPERIOR MARGINS Right 04/08/2019   Procedure: RE-EXCISION OF RIGHT BREAST ANTERIOR MARGINS;  Surgeon: Griselda Miner, MD;  Location: [facility] ORS;  Service: General;  Laterality:  Right;   right knee meniscus     SBO  10/2016   small bowel obstruction   SIMPLE MASTECTOMY WITH AXILLARY SENTINEL NODE BIOPSY Right 07/26/2019   Procedure: RIGHT MASTECTOMY WITH SENTINEL NODE BIOPSY;  Surgeon: Griselda Miner, MD;  Location: [facility];  Service: General;  Laterality: Right;   TOTAL HIP ARTHROPLASTY Left 10/01/2017   Procedure: LEFT  TOTAL HIP ARTHROPLASTY ANTERIOR APPROACH;  Surgeon: Ollen Gross, MD;  Location: [facility] ORS;  Service: Orthopedics;  Laterality: Left;   TOTAL HIP ARTHROPLASTY Right 03/11/2018   Procedure: RIGHT TOTAL HIP ARTHROPLASTY ANTERIOR APPROACH;  Surgeon: Ollen Gross, MD;  Location: [facility] OR[facility];  Service: Orthopedics;  Laterality: Right;     UPPER GASTROINTESTINAL ENDOSCOPY       reports that she quit smoking about 6 years ago. Her smoking use included e-cigarettes and cigarettes. She started smoking about 38 years ago. She has a 32 pack-year smoking history. She has never used smokeless tobacco. She reports that she does not drink alcohol and does not use drugs.  Family History  Problem Relation Age of Onset   Cancer Sister    Diabetes Brother    Diabetes Brother    Hypertension Other    Breast cancer Sister    Colon cancer Neg Hx    Colon polyps Neg Hx    Rectal cancer Neg Hx    Stomach cancer Neg Hx     Prior to Admission medications   Medication Sig Start Date End Date Taking? Authorizing Provider  amLODipine (NORVASC) 10 MG tablet Take 1 tablet by mouth once daily 04/25/23   Anabel Halon, MD  celecoxib (CELEBREX) 100 MG capsule TAKE 1 CAPSULE BY MOUTH TWICE DAILY AS NEEDED FOR MILD TO MODERATE PAIN 04/25/23   Anabel Halon, MD  cetirizine (ZYRTEC) 10 MG tablet Take 1 tablet (10 mg total) by mouth daily. 05/15/23   Ashok Croon, MD  Cholecalciferol (VITAMIN D-3) 125 MCG (5000 UT) TABS Take 2 tablets by mouth daily.    [provider]  famotidine (PEPCID) 20 MG tablet Take 1 tablet (20 mg total) by mouth 2 (two) times daily. 05/15/23   Ashok Croon, MD  fluticasone (FLONASE) 50 MCG/ACT nasal spray Place 2 sprays into both nostrils daily. 05/15/23   Ashok Croon, MD  losartan (COZAAR) 100 MG tablet Take 1 tablet by mouth once daily 05/05/23   Anabel Halon, MD  metFORMIN (GLUCOPHAGE-XR) 500 MG 24 hr tablet Take 1 tablet (500 mg total) by mouth daily with breakfast. 02/14/23   Anabel Halon, MD  sodium chloride (OCEAN) 0.65 % SOLN nasal spray Place 1 spray into both nostrils as needed. 05/15/23   Ashok Croon, MD  thyroid (NP THYROID) 90 MG tablet Take 1 tablet (90 mg total) by mouth daily. 02/14/23   Anabel Halon, MD  triamcinolone cream (KENALOG) 0.1 % Apply 1 Application  topically 2 (two) times daily. 02/13/23   Anabel Halon, MD    Physical Exam: Vitals:   06/13/23 1838 06/13/23 2135 06/13/23 2300 06/14/23 0024  BP: (!) 203/80 (!) 179/101 (!) 175/84 (!) 147/65  Pulse: (!) 55 61 66 (!) 51  Resp: 16 18 20 18   Temp: 98 F (36.7 C) 98 F (36.7 C)  98.5 F (36.9 C)  TempSrc:  Oral  Oral  SpO2: 100% 100% 99% 97%  Weight:      Height:       Physical Exam Vitals reviewed.  Constitutional:  Appearance: She is normal weight.  HENT:     Head: Normocephalic.  Cardiovascular:     Rate and Rhythm: Normal rate and regular rhythm.  Pulmonary:     Breath sounds: Normal breath sounds.  Abdominal:     General: Abdomen is flat.     Palpations: Abdomen is soft.  Skin:    General: Skin is warm.     Capillary Refill: Capillary refill takes less than 2 seconds.  Neurological:     General: No focal deficit present.     Mental Status: She is alert and oriented to person, place, and time.  Psychiatric:        Mood and Affect: Mood normal.        Labs on Admission: I have personally reviewed the patients's labs and imaging studies.  Assessment/Plan Principal Problem:   Pancreatic mass   # Obstructive jaundice # Abdominal pain - CT imaging with concern for pancreatic malignancy - Bilirubin 6.1 -GI consulted  Plan: Obtain MRCP Trend LFTs N.p.o. Start zosyn  # GERD-continue Pepcid  # Hypertension-continue amlodipine and losartan   Admission status: Inpatient Telemetry  Certification: The appropriate patient status for this patient is INPATIENT. Inpatient status is judged to be reasonable and necessary in order to provide the required intensity of service to ensure the patient's safety. The patient's presenting symptoms, physical exam findings, and initial radiographic and laboratory data in the context of their chronic comorbidities is felt to place them at high risk for further clinical deterioration. Furthermore, it is not anticipated  that the patient will be medically stable for discharge from the hospital within 2 midnights of admission.   * I certify that at the point of admission it is my clinical judgment that the patient will require inpatient hospital care spanning beyond 2 midnights from the point of admission due to high intensity of service, high risk for further deterioration and high frequency of surveillance required.Alan Mulder MD [facility] If 7PM-7AM, please contact night-coverage [email or web address]  06/14/2023, 2:28 AM

## 2023-06-14 NOTE — Transfer of Care (Signed)
Immediate Anesthesia Transfer of Care Note  Patient: Adrienne [name]  Procedure(s) Performed: ENDOSCOPIC RETROGRADE CHOLANGIOPANCREATOGRAPHY (ERCP) ESOPHAGOGASTRODUODENOSCOPY (EGD) WITH PROPOFOL MALONEY DILATION PANCREATIC STENT PLACEMENT BIOPSY  Patient Location: PACU  Anesthesia Type:General  Level of Consciousness: awake, alert , oriented, and patient cooperative  Airway & Oxygen Therapy: Patient Spontanous Breathing and Patient connected to face mask oxygen  Post-op Assessment: Report given to RN and Post -op Vital signs reviewed and stable  Post vital signs: Reviewed and stable  Last Vitals:  Vitals Value Taken Time  BP 176/71 06/14/23 1055  Temp 36.4 C 06/14/23 1054  Pulse 52 06/14/23 1058  Resp 16 06/14/23 1058  SpO2 98 % 06/14/23 1058  Vitals shown include unfiled device data.  Last Pain:  Vitals:   06/14/23 0915  TempSrc: Temporal  PainSc: 0-No pain         Complications: No notable events documented.

## 2023-06-14 NOTE — Progress Notes (Signed)
Pharmacy Antibiotic Note  Adrienne [name] is a 77 y.o. female admitted on 06/13/2023 with intra-abdominal infection.  Pharmacy has been consulted for Zosyn dosing.  Plan: Zosyn 3.375g IV q8h (4 hour infusion). Need for further dosage adjustment appears unlikely at present.    Will sign off at this time.  Please reconsult if a change in clinical status warrants re-evaluation of dosage.   Height: 5\' 3"  (160 cm) Weight: 79.4 kg (175 lb) IBW/kg (Calculated) : 52.4  Temp (24hrs), Avg:98.2 F (36.8 C), Min:98 F (36.7 C), Max:98.5 F (36.9 C)  Recent Labs  Lab 06/13/23 1837  WBC 8.5  CREATININE 0.97    Estimated Creatinine Clearance: 50 mL/min (by C-G formula based on SCr of 0.97 mg/dL).    Allergies  Allergen Reactions   Lisinopril Swelling    Angioedema Angioedema Angioedema   Statins     Myalgias   Atorvastatin Other (See Comments)    Muscle aches   Muscle aches  Muscle aches      Thank you for allowing pharmacy to be a part of this patient's care.  Maryellen Pile, PharmD Clinical Pharmacist 06/14/2023 2:39 AM

## 2023-06-14 NOTE — Progress Notes (Deleted)
Mult scopes used, tried ercp first 945 didn't work, egd [phone], tried exalt unsucc, then ercp [id number], then egd again for just to look [phone]

## 2023-06-15 DIAGNOSIS — K831 Obstruction of bile duct: Secondary | ICD-10-CM | POA: Diagnosis not present

## 2023-06-15 DIAGNOSIS — K838 Other specified diseases of biliary tract: Secondary | ICD-10-CM

## 2023-06-15 DIAGNOSIS — K2970 Gastritis, unspecified, without bleeding: Secondary | ICD-10-CM

## 2023-06-15 DIAGNOSIS — K8689 Other specified diseases of pancreas: Secondary | ICD-10-CM | POA: Diagnosis not present

## 2023-06-15 DIAGNOSIS — K2990 Gastroduodenitis, unspecified, without bleeding: Secondary | ICD-10-CM

## 2023-06-15 DIAGNOSIS — K222 Esophageal obstruction: Secondary | ICD-10-CM | POA: Diagnosis not present

## 2023-06-15 LAB — CBC
HCT: 36.9 % (ref 36.0–46.0)
Hemoglobin: 12.2 g/dL (ref 12.0–15.0)
MCH: 31 pg (ref 26.0–34.0)
MCHC: 33.1 g/dL (ref 30.0–36.0)
MCV: 93.7 fL (ref 80.0–100.0)
Platelets: 219 10*3/uL (ref 150–400)
RBC: 3.94 MIL/uL (ref 3.87–5.11)
RDW: 15 % (ref 11.5–15.5)
WBC: 10 10*3/uL (ref 4.0–10.5)
nRBC: 0 % (ref 0.0–0.2)

## 2023-06-15 LAB — COMPREHENSIVE METABOLIC PANEL
ALT: 330 U/L — ABNORMAL HIGH (ref 0–44)
AST: 190 U/L — ABNORMAL HIGH (ref 15–41)
Albumin: 3.5 g/dL (ref 3.5–5.0)
Alkaline Phosphatase: 458 U/L — ABNORMAL HIGH (ref 38–126)
Anion gap: 10 (ref 5–15)
BUN: 10 mg/dL (ref 8–23)
CO2: 24 mmol/L (ref 22–32)
Calcium: 9.4 mg/dL (ref 8.9–10.3)
Chloride: 102 mmol/L (ref 98–111)
Creatinine, Ser: 1.08 mg/dL — ABNORMAL HIGH (ref 0.44–1.00)
GFR, Estimated: 54 mL/min — ABNORMAL LOW (ref >60)
Glucose, Bld: 194 mg/dL — ABNORMAL HIGH (ref 70–99)
Potassium: 3.9 mmol/L (ref 3.5–5.1)
Sodium: 136 mmol/L (ref 135–145)
Total Bilirubin: 2.8 mg/dL — ABNORMAL HIGH (ref <1.2)
Total Protein: 7.5 g/dL (ref 6.5–8.1)

## 2023-06-15 NOTE — Plan of Care (Signed)

## 2023-06-15 NOTE — Plan of Care (Signed)
Request to IR for biliary drain placement for obstructive jaundice .  Patient history and imaging reviewed by Dr. Bryn Gulling who approves procedure.  Plan: - NPO at midnight on 12/23 - CBC/INR AM 12/23 - Lovenox dose 12/22 held - IR APP will see for full consult/consent   Please call on call IR MD with questions or concerns.  Lynnette Caffey, PA-C

## 2023-06-15 NOTE — Progress Notes (Signed)
[facility]  PROGRESS NOTE  Adrienne [name] WUJ:[id number] DOB: 1945/12/04 DOA: 06/13/2023 PCP: Anabel Halon, MD   Brief HPI:   77 y.o. female with medical history significant of CHF, hyperlipidemia, hypertension who presents to the emergency department due to abdominal discomfort.  Patient has been having ongoing abdominal pain and changes in her urination over the last week.  She presented to outside hospital where she was found to be afebrile and hemodynamically stable.  Labs were obtained which showed total bilirubin 6.1, AST 213, ALT 359, WBC 8.5, hemoglobin 12.9, urinalysis negative for infection.  CT abdomen pelvis showed hypodense region in head of pancreas with ductal dilation concerning for malignancy.  Patient was transferred for MRCP       Assessment/Plan:   Pancreatic mass Obstructive jaundice; bilirubin was 6.7>> 2.8 this morning -LFTs down with AST 190, ALT 330 -MRCP showed vague masslike fullness of central pancreatic head, severe pancreatic ductal dilatation distally, finding concerning for pancreatic adenocarcinoma GI consulted, underwent ERCP today; stent placement in  pancreatic duct; unable to cannulate bile duct, cholangiogram failed -GI following, likely will need repeat ERCP -Follow CA 19-9 -Dr. Leone Payor has consulted IR for biliary drain placement   GERD -Continue Pepcid  Hypertension -Continue amlodipine, losartan   Medications     amLODipine  10 mg Oral Daily   diclofenac  100 mg Rectal Once   enoxaparin (LOVENOX) injection  40 mg Subcutaneous Q24H   famotidine  20 mg Oral BID   feeding supplement  1 Container Oral TID BM   losartan  100 mg Oral Daily     Data Reviewed:   CBG:  No results for input(s): "GLUCAP" in the last 168 hours.  SpO2: 95 % O2 Flow Rate (L/min): 6 L/min    Vitals:   06/14/23 1500 06/14/23 2047 06/15/23 0428 06/15/23 0949  BP: (!) 158/91 (!) 149/87 (!) 178/81 (!) 174/76  Pulse: (!) 57 (!) 56 (!) 53    Resp:  14 14   Temp:  98.2 F (36.8 C) 98.5 F (36.9 C)   TempSrc:  Oral Oral   SpO2:  96% 95%   Weight:      Height:          Data Reviewed:  Basic Metabolic Panel: Recent Labs  Lab 06/13/23 1837 06/14/23 0702 06/15/23 0717  NA 137 139 136  K 4.4 3.6 3.9  CL 103 107 102  CO2 25 23 24   GLUCOSE 200* 161* 194*  BUN 11 10 10   CREATININE 0.97 0.79 1.08*  CALCIUM 10.0 9.3 9.4    CBC: Recent Labs  Lab 06/13/23 1837 06/14/23 0702 06/15/23 0717  WBC 8.5 7.0 10.0  HGB 12.9 11.6* 12.2  HCT 39.1 34.2* 36.9  MCV 92.9 93.7 93.7  PLT 195 202 219    LFT Recent Labs  Lab 06/13/23 1837 06/14/23 0702 06/15/23 0717  AST 213* 208* 190*  ALT 359* 304* 330*  ALKPHOS 526* 419* 458*  BILITOT 6.1* 6.7* 2.8*  PROT 8.3* 7.0 7.5  ALBUMIN 4.2 3.3* 3.5     Antibiotics: Anti-infectives (From admission, onward)    Start     Dose/Rate Route Frequency Ordered Stop   06/14/23 0300  piperacillin-tazobactam (ZOSYN) IVPB 3.375 g        3.375 g 12.5 mL/hr over 240 Minutes Intravenous Every 8 hours 06/14/23 0239     06/13/23 2200  cefTRIAXone (ROCEPHIN) 1 g in sodium chloride 0.9 % 100 mL IVPB  1 g 200 mL/hr over 30 Minutes Intravenous  Once 06/13/23 2155 06/13/23 2247        DVT prophylaxis: Lovenox  Code Status: Full code  Family Communication: No family at bedside   CONSULTS gastroenterology   Subjective   Denies abdominal pain.  No nausea or vomiting   Objective    Physical Examination:   General-appears in no acute distress Heart-S1-S2, regular, no murmur auscultated Lungs-clear to auscultation bilaterally, no wheezing or crackles auscultated Abdomen-soft, nontender, no organomegaly Extremities-no edema in the lower extremities Neuro-alert, oriented x3, no focal deficit noted   Status is: Inpatient:             [name]   [facility] If 7PM-7AM, please contact night-coverage at [email or web address], Office   [phone]   06/15/2023, 10:54 AM  LOS: 2 days

## 2023-06-15 NOTE — Plan of Care (Signed)
  Problem: Health Behavior/Discharge Planning: Goal: Ability to manage health-related needs will improve Outcome: Progressing   Problem: Clinical Measurements: Goal: Ability to maintain clinical measurements within normal limits will improve Outcome: Progressing Goal: Will remain free from infection Outcome: Progressing Goal: Diagnostic test results will improve Outcome: Progressing Goal: Respiratory complications will improve Outcome: Progressing Goal: Cardiovascular complication will be avoided Outcome: Progressing   Problem: Nutrition: Goal: Adequate nutrition will be maintained Outcome: Progressing   Problem: Elimination: Goal: Will not experience complications related to bowel motility Outcome: Progressing Goal: Will not experience complications related to urinary retention Outcome: Progressing   Problem: Pain Management: Goal: General experience of comfort will improve Outcome: Progressing   Problem: Safety: Goal: Ability to remain free from injury will improve Outcome: Progressing   Problem: Skin Integrity: Goal: Risk for impaired skin integrity will decrease Outcome: Progressing

## 2023-06-15 NOTE — Progress Notes (Signed)
Patient Name: Adrienne [name] Date of Encounter: 06/15/2023, 12:34 PM     Assessment and Plan  Obstructive jaundice w/ mass in head of pancreas. Pancreatic and biliary dilation. No hx chronic pancreatitis. On Zosyn IV. CA 19-9 pending -   Unable to cannulate bile duct at ERCP 12/21 - pancreatic duct stent left -sig decreased bilirubin today  Proximal esophageal stenosis - dilated 40 Fr Maloney(required to pass duodenoscope), hx of dysphagia  Gastritis - bxed  ----------------------------------------------------------------------------------------------------  Surprising that bilirubin has decreased -   Still needs biliary decompression I think (depending upon what bilirubin is tomorrow)  - will make NPO after MN and consult IR. Possibility of repeat ERCP attempt at discretion of oncoming [facility] GI team  Lovenox is being administered at 2200 so should not affect procedures as they would very likely be after 10AM  F/U Gastric bxs  Remember that she has a pancreatic duct stent and if we do not know if it has passed prior to dc will need KUB about 7-10 days after dc to   Soft diet today and NPO after MN  Eventual EUS (outpatient)  Iva Boop, MD, [facility] Gastroenterology See Loretha Stapler on call - gastroenterology for best contact person 06/15/2023 12:42 PM    Subjective  Feels ok w/o pain or nausea, wondering if she will get home before XMA   Objective  BP (!) 174/76   Pulse (!) 53   Temp 98.5 F (36.9 C) (Oral)   Resp 14   Ht 5\' 3"  (1.6 m)   Wt 79.4 kg   SpO2 95%   BMI 31.00 kg/m  Elderly bw NAD Abd is soft, NT BS +  Recent Labs  Lab 06/13/23 1837 06/14/23 0702 06/15/23 0717  AST 213* 208* 190*  ALT 359* 304* 330*  ALKPHOS 526* 419* 458*  BILITOT 6.1* 6.7* 2.8*  PROT 8.3* 7.0 7.5  ALBUMIN 4.2 3.3* 3.5  INR  --  1.0  --    Recent Labs  Lab 06/13/23 1837 06/14/23 0702 06/15/23 0717  NA 137 139 136  K 4.4 3.6 3.9  CL 103 107 102  CO2 25  23 24   GLUCOSE 200* 161* 194*  BUN 11 10 10   CREATININE 0.97 0.79 1.08*  CALCIUM 10.0 9.3 9.4   Recent Labs  Lab 06/13/23 1837 06/14/23 0702 06/15/23 0717  HGB 12.9 11.6* 12.2  HCT 39.1 34.2* 36.9  WBC 8.5 7.0 10.0  PLT 195 202 219    EGD/ERCP 06/14/23 - The examination was suspicious for a pancreatic tumor in the head of the pancreas. Pancreatic duct cannulated and partially filled - very dilated - underfilled by intent - given multiple wire passages and contrast injection a 4 Fr 3 cm no flap singel pigtail stent was placed. - Attempts at a cholangiogram failed. - Esophageal stenosis. Dilated 40 fr Maloney w/ mild mucosal disruption - she had been c/o mild dysphagia and had seen ENT - Mucosal changes suspicious for gastritis. Biopsied. Impression:  MRCP/MR 06/14/23 IMPRESSION: 1. Vague, masslike fullness of the central pancreatic head measuring 2.5 x 2.0 cm. Minimal underlying diffusion restriction and hypoenhancement. Severe pancreatic ductal dilatation distally, measuring up to 1.3 cm in caliber, with mild pancreatic parenchymal atrophy. Findings are highly concerning for pancreatic adenocarcinoma. 2. Severe intra and extrahepatic biliary ductal dilatation the common bile duct effaced within the superior pancreatic head and the extrahepatic duct measuring up to 1.7 cm in caliber. 3. The portal confluence and central superior mesenteric vein appear  to be encased and partially effaced in the vicinity of suspected pancreatic head mass. 4. Status post cholecystectomy. 5. No evidence of lymphadenopathy or metastatic disease in the abdomen. 6. Cardiomegaly.  High complexity medical decision making  Iva Boop, MD, [facility] Gastroenterology See Loretha Stapler on call - gastroenterology for best contact person 06/15/2023 12:34 PM

## 2023-06-16 ENCOUNTER — Other Ambulatory Visit: Payer: Self-pay

## 2023-06-16 ENCOUNTER — Other Ambulatory Visit (HOSPITAL_COMMUNITY): Payer: Self-pay

## 2023-06-16 ENCOUNTER — Telehealth: Payer: Self-pay

## 2023-06-16 ENCOUNTER — Encounter: Payer: Self-pay | Admitting: Cardiology

## 2023-06-16 DIAGNOSIS — C250 Malignant neoplasm of head of pancreas: Secondary | ICD-10-CM

## 2023-06-16 DIAGNOSIS — K831 Obstruction of bile duct: Secondary | ICD-10-CM | POA: Diagnosis not present

## 2023-06-16 DIAGNOSIS — R7401 Elevation of levels of liver transaminase levels: Secondary | ICD-10-CM | POA: Diagnosis not present

## 2023-06-16 DIAGNOSIS — K8689 Other specified diseases of pancreas: Secondary | ICD-10-CM | POA: Diagnosis not present

## 2023-06-16 DIAGNOSIS — N3000 Acute cystitis without hematuria: Secondary | ICD-10-CM | POA: Diagnosis not present

## 2023-06-16 LAB — COMPREHENSIVE METABOLIC PANEL
ALT: 257 U/L — ABNORMAL HIGH (ref 0–44)
AST: 116 U/L — ABNORMAL HIGH (ref 15–41)
Albumin: 3.2 g/dL — ABNORMAL LOW (ref 3.5–5.0)
Alkaline Phosphatase: 385 U/L — ABNORMAL HIGH (ref 38–126)
Anion gap: 9 (ref 5–15)
BUN: 15 mg/dL (ref 8–23)
CO2: 26 mmol/L (ref 22–32)
Calcium: 9.3 mg/dL (ref 8.9–10.3)
Chloride: 104 mmol/L (ref 98–111)
Creatinine, Ser: 0.97 mg/dL (ref 0.44–1.00)
GFR, Estimated: >60 mL/min (ref >60)
Glucose, Bld: 242 mg/dL — ABNORMAL HIGH (ref 70–99)
Potassium: 3.6 mmol/L (ref 3.5–5.1)
Sodium: 139 mmol/L (ref 135–145)
Total Bilirubin: 1.8 mg/dL — ABNORMAL HIGH (ref <1.2)
Total Protein: 6.9 g/dL (ref 6.5–8.1)

## 2023-06-16 LAB — CBC
HCT: 37.3 % (ref 36.0–46.0)
Hemoglobin: 11.9 g/dL — ABNORMAL LOW (ref 12.0–15.0)
MCH: 30.6 pg (ref 26.0–34.0)
MCHC: 31.9 g/dL (ref 30.0–36.0)
MCV: 95.9 fL (ref 80.0–100.0)
Platelets: 220 10*3/uL (ref 150–400)
RBC: 3.89 MIL/uL (ref 3.87–5.11)
RDW: 15.5 % (ref 11.5–15.5)
WBC: 9.6 10*3/uL (ref 4.0–10.5)
nRBC: 0 % (ref 0.0–0.2)

## 2023-06-16 LAB — URINE CULTURE: Culture: >=100000 — AB

## 2023-06-16 LAB — CANCER ANTIGEN 19-9: CA 19-9: <2 U/mL (ref 0–35)

## 2023-06-16 LAB — PROTIME-INR
INR: 0.9 (ref 0.8–1.2)
Prothrombin Time: 12.4 s (ref 11.4–15.2)

## 2023-06-16 MED ORDER — ONDANSETRON HCL 4 MG PO TABS: 4.0000 mg | ORAL_TABLET | Freq: Four times a day (QID) | ORAL | 0 refills | Status: DC | PRN

## 2023-06-16 MED ORDER — ONDANSETRON HCL 4 MG PO TABS
4.0000 mg | ORAL_TABLET | Freq: Four times a day (QID) | ORAL | 0 refills | Status: DC | PRN
  Filled 2023-06-16: qty 20, 5d supply, fill #0

## 2023-06-16 MED ORDER — AMOXICILLIN-POT CLAVULANATE 875-125 MG PO TABS
1.0000 | ORAL_TABLET | Freq: Two times a day (BID) | ORAL | 0 refills | Status: DC
  Filled 2023-06-16: qty 6, 3d supply, fill #0

## 2023-06-16 MED ORDER — AMOXICILLIN-POT CLAVULANATE 875-125 MG PO TABS: 1.0000 | ORAL_TABLET | Freq: Two times a day (BID) | ORAL | 0 refills | Status: AC

## 2023-06-16 NOTE — Telephone Encounter (Addendum)
Labs ordered. KUB ordered. Oncology referral entered as urgent.  Let me know about the radiology order for IR, please.

## 2023-06-16 NOTE — Progress Notes (Signed)
AVS reviewed with PT no inquires made. No complaints of chest pain, SOB or discomfort. Escort to lobby via wheelchair

## 2023-06-16 NOTE — Discharge Summary (Signed)
Physician Discharge Summary   Patient: Adrienne [name] MRN: [id number] DOB: 1945/08/28  Admit date:     06/13/2023  Discharge date: 06/16/23  Discharge Physician: Rickey Barbara   PCP: Anabel Halon, MD   Recommendations at discharge:    Follow up with PCP in 1-2 weeks Follow up with GI as scheduled Follow up with [facility] as will be scheduled  Discharge Diagnoses: Principal Problem:   Pancreatic mass Active Problems:   Obstructive jaundice   Gastritis and gastroduodenitis   Esophageal stenosis  Resolved Problems:   * No resolved hospital problems. *  Hospital Course: 77 y.o. female with medical history significant of CHF, hyperlipidemia, hypertension who presents to the emergency department due to abdominal discomfort.  Patient has been having ongoing abdominal pain and changes in her urination over the last week.  She presented to outside hospital where she was found to be afebrile and hemodynamically stable.  Labs were obtained which showed total bilirubin 6.1, AST 213, ALT 359, WBC 8.5, hemoglobin 12.9, urinalysis negative for infection.  CT abdomen pelvis showed hypodense region in head of pancreas with ductal dilation concerning for malignancy.  Patient was transferred for MRCP   Assessment and Plan: Pancreatic mass Obstructive jaundice; -MRCP showed vague masslike fullness of central pancreatic head, severe pancreatic ductal dilatation distally, finding concerning for pancreatic adenocarcinoma GI consulted, underwent ERCP with stent placement in  pancreatic duct; unable to cannulate bile duct, cholangiogram failed -Initial plan for f/u bilary drain and possible repeat ERCP -LFT's and bili later improved and pt no longer planned for drain by IR -Discussed with GI. OK to d/c today. Recs for repeat LFT's and KUB on Monday, possible re-attempt at ERCP   GERD -Continued Pepcid   Hypertension -Continue amlodipine, losartan    Consultants: GI, IR Procedures  performed: ERCP  Disposition: Home Diet recommendation:  Regular diet DISCHARGE MEDICATION: Allergies as of 06/16/2023       Reactions   Lisinopril Swelling   Angioedema   Statins    Myalgias   Atorvastatin Other (See Comments)   Muscle aches         Medication List     STOP taking these medications    celecoxib 100 MG capsule Commonly known as: CELEBREX       TAKE these medications    amLODipine 10 MG tablet Commonly known as: NORVASC Take 1 tablet by mouth once daily   amoxicillin-clavulanate 875-125 MG tablet Commonly known as: AUGMENTIN Take 1 tablet by mouth 2 (two) times daily for 3 days.   cetirizine 10 MG tablet Commonly known as: ZYRTEC Take 1 tablet (10 mg total) by mouth daily. What changed:  when to take this reasons to take this   famotidine 20 MG tablet Commonly known as: PEPCID Take 1 tablet (20 mg total) by mouth 2 (two) times daily.   fluticasone 50 MCG/ACT nasal spray Commonly known as: FLONASE Place 2 sprays into both nostrils daily.   losartan 100 MG tablet Commonly known as: COZAAR Take 1 tablet by mouth once daily   metFORMIN 500 MG 24 hr tablet Commonly known as: GLUCOPHAGE-XR Take 1 tablet (500 mg total) by mouth daily with breakfast.   ondansetron 4 MG tablet Commonly known as: ZOFRAN Take 1 tablet (4 mg total) by mouth every 6 (six) hours as needed for nausea.   sodium chloride 0.65 % Soln nasal spray Commonly known as: OCEAN Place 1 spray into both nostrils as needed.   thyroid 90 MG tablet Commonly  known as: NP Thyroid Take 1 tablet (90 mg total) by mouth daily.   Vitamin D-3 125 MCG (5000 UT) Tabs Take 2 tablets by mouth daily.        Follow-up Information     Anabel Halon, MD Follow up in 2 week(s).   Specialty: Internal Medicine Why: Hospital follow up Contact information: [address] [place] [address] [phone]         Napoleon Form, MD Follow up.   Specialty:  Gastroenterology Why: as scheduled Contact information: [address] [place] [address] [phone]         Follow up with the [facility] as will be arranged Follow up.   Why: Hospital follow up               Discharge Exam: Filed Weights   06/13/23 1835  Weight: 79.4 kg   General exam: Awake, laying in bed, in nad Respiratory system: Normal respiratory effort, no wheezing Cardiovascular system: regular rate, s1, s2 Gastrointestinal system: Soft, nondistended, positive BS Central nervous system: CN2-12 grossly intact, strength intact Extremities: Perfused, no clubbing Skin: Normal skin turgor, no notable skin lesions seen Psychiatry: Mood normal // no visual hallucinations   Condition at discharge: fair  The results of significant diagnostics from this hospitalization (including imaging, microbiology, ancillary and laboratory) are listed below for reference.   Imaging Studies: DG ERCP Result Date: 06/14/2023 CLINICAL DATA:  Acalculous cholecystitis; probable malignant biliary stricture EXAM: ERCP TECHNIQUE: Multiple spot images obtained with the fluoroscopic device and submitted for interpretation post-procedure. FLUOROSCOPY: Radiation Exposure Index (as provided by the fluoroscopic device): 84.7 MGy Kerma COMPARISON:  MRCP 06/14/2023 FINDINGS: A total of 9 intraoperative spot images are submitted for review. The images demonstrate cannulation of the pancreatic duct with balloon occluded ductogram. There is diffuse pancreatic ductal dilatation. The final images demonstrate placement of a temporary plastic pancreatic duct stent. IMPRESSION: 1. Diffuse pancreatic ductal dilatation. 2. Placement of a plastic pancreatic duct stent. These images were submitted for radiologic interpretation only. Please see the procedural report for the amount of contrast and the fluoroscopy time utilized. Electronically Signed   By: Malachy Moan M.D.   On: 06/14/2023 19:52    MR ABDOMEN MRCP W WO CONTAST Result Date: 06/14/2023 CLINICAL DATA:  Abdominal pain pancreatic mass suspected by prior CT EXAM: MRI ABDOMEN WITHOUT AND WITH CONTRAST (INCLUDING MRCP) TECHNIQUE: Multiplanar multisequence MR imaging of the abdomen was performed both before and after the administration of intravenous contrast. Heavily T2-weighted images of the biliary and pancreatic ducts were obtained, and three-dimensional MRCP images were rendered by post processing. CONTRAST:  7mL GADAVIST GADOBUTROL 1 MMOL/ML IV SOLN COMPARISON:  CT abdomen pelvis, 06/13/2023 FINDINGS: Lower chest: No acute abnormality.  Cardiomegaly. Hepatobiliary: No solid liver abnormality is seen. Status post cholecystectomy. Severe intra and extrahepatic biliary ductal dilatation the common bile duct effaced within the superior pancreatic head and the extrahepatic duct measuring up to 1.7 cm in caliber. Pancreas: Vague, masslike fullness of the central pancreatic head measuring 2.5 x 2.0 cm (series 16, image 59). Minimal underlying diffusion restriction and hypoenhancement. Severe pancreatic ductal dilatation distally, measuring up to 1.3 cm in caliber, with mild pancreatic parenchymal atrophy. Spleen: Normal in size without significant abnormality. Adrenals/Urinary Tract: Adrenal glands are unremarkable. Low-lying, under rotated right kidney, incompletely imaged. Left kidney is normal, without obvious renal calculi, solid lesion, or hydronephrosis. Stomach/Bowel: Stomach is within normal limits. No evidence of bowel wall thickening, distention, or inflammatory  changes. Vascular/Lymphatic: Aortic atherosclerosis. The portal confluence and central superior mesenteric vein appear to be encased and partially effaced in the vicinity of suspected pancreatic head mass (series 2, image 44). Fat planes to the celiac axis and superior mesenteric artery are preserved. No enlarged abdominal lymph nodes. Other: No abdominal wall hernia or  abnormality. No ascites. Musculoskeletal: No acute or significant osseous findings. IMPRESSION: 1. Vague, masslike fullness of the central pancreatic head measuring 2.5 x 2.0 cm. Minimal underlying diffusion restriction and hypoenhancement. Severe pancreatic ductal dilatation distally, measuring up to 1.3 cm in caliber, with mild pancreatic parenchymal atrophy. Findings are highly concerning for pancreatic adenocarcinoma. 2. Severe intra and extrahepatic biliary ductal dilatation the common bile duct effaced within the superior pancreatic head and the extrahepatic duct measuring up to 1.7 cm in caliber. 3. The portal confluence and central superior mesenteric vein appear to be encased and partially effaced in the vicinity of suspected pancreatic head mass. 4. Status post cholecystectomy. 5. No evidence of lymphadenopathy or metastatic disease in the abdomen. 6. Cardiomegaly. Aortic Atherosclerosis (ICD10-I70.0). Electronically Signed   By: Jearld Lesch M.D.   On: 06/14/2023 10:18   MR 3D Recon At Scanner Result Date: 06/14/2023 CLINICAL DATA:  Abdominal pain pancreatic mass suspected by prior CT EXAM: MRI ABDOMEN WITHOUT AND WITH CONTRAST (INCLUDING MRCP) TECHNIQUE: Multiplanar multisequence MR imaging of the abdomen was performed both before and after the administration of intravenous contrast. Heavily T2-weighted images of the biliary and pancreatic ducts were obtained, and three-dimensional MRCP images were rendered by post processing. CONTRAST:  7mL GADAVIST GADOBUTROL 1 MMOL/ML IV SOLN COMPARISON:  CT abdomen pelvis, 06/13/2023 FINDINGS: Lower chest: No acute abnormality.  Cardiomegaly. Hepatobiliary: No solid liver abnormality is seen. Status post cholecystectomy. Severe intra and extrahepatic biliary ductal dilatation the common bile duct effaced within the superior pancreatic head and the extrahepatic duct measuring up to 1.7 cm in caliber. Pancreas: Vague, masslike fullness of the central pancreatic  head measuring 2.5 x 2.0 cm (series 16, image 59). Minimal underlying diffusion restriction and hypoenhancement. Severe pancreatic ductal dilatation distally, measuring up to 1.3 cm in caliber, with mild pancreatic parenchymal atrophy. Spleen: Normal in size without significant abnormality. Adrenals/Urinary Tract: Adrenal glands are unremarkable. Low-lying, under rotated right kidney, incompletely imaged. Left kidney is normal, without obvious renal calculi, solid lesion, or hydronephrosis. Stomach/Bowel: Stomach is within normal limits. No evidence of bowel wall thickening, distention, or inflammatory changes. Vascular/Lymphatic: Aortic atherosclerosis. The portal confluence and central superior mesenteric vein appear to be encased and partially effaced in the vicinity of suspected pancreatic head mass (series 2, image 44). Fat planes to the celiac axis and superior mesenteric artery are preserved. No enlarged abdominal lymph nodes. Other: No abdominal wall hernia or abnormality. No ascites. Musculoskeletal: No acute or significant osseous findings. IMPRESSION: 1. Vague, masslike fullness of the central pancreatic head measuring 2.5 x 2.0 cm. Minimal underlying diffusion restriction and hypoenhancement. Severe pancreatic ductal dilatation distally, measuring up to 1.3 cm in caliber, with mild pancreatic parenchymal atrophy. Findings are highly concerning for pancreatic adenocarcinoma. 2. Severe intra and extrahepatic biliary ductal dilatation the common bile duct effaced within the superior pancreatic head and the extrahepatic duct measuring up to 1.7 cm in caliber. 3. The portal confluence and central superior mesenteric vein appear to be encased and partially effaced in the vicinity of suspected pancreatic head mass. 4. Status post cholecystectomy. 5. No evidence of lymphadenopathy or metastatic disease in the abdomen. 6. Cardiomegaly. Aortic Atherosclerosis (ICD10-I70.0). Electronically Signed  By: Jearld Lesch M.D.   On: 06/14/2023 10:18   CT ABDOMEN PELVIS W CONTRAST Result Date: 06/13/2023 CLINICAL DATA:  Abdominal pain, acute, nonlocalized. Left lower side abdominal pain and brown urine since Sunday. EXAM: CT ABDOMEN AND PELVIS WITH CONTRAST TECHNIQUE: Multidetector CT imaging of the abdomen and pelvis was performed using the standard protocol following bolus administration of intravenous contrast. RADIATION DOSE REDUCTION: This exam was performed according to the departmental dose-optimization program which includes automated exposure control, adjustment of the mA and/or kV according to patient size and/or use of iterative reconstruction technique. CONTRAST:  OMNIPAQUE IOHEXOL 300 MG/ML  SOLN COMPARISON:  11/13/2016. FINDINGS: Lower chest: Heart is mildly enlarged. Mild atelectasis or scarring is present at the lung bases. Hepatobiliary: No focal liver abnormality is seen. Status post cholecystectomy. Intrahepatic and extrahepatic biliary ductal dilatation are noted. The common bile duct measures up to 1.6 cm. Pancreas: There is an ill-defined hypodense region in the head of the pancreas measuring 2.9 x 2.2 cm. There is dilatation of the pancreatic duct up to 1.2 cm distal to this region. No surrounding inflammatory changes are seen. Spleen: Normal in size without focal abnormality. Adrenals/Urinary Tract: The adrenal glands are within normal limits. The kidneys enhance symmetrically. 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 The right kidney is pelvic in location. No renal calculus or hydronephrosis is seen. The visualized portion of the urinary bladder is within normal limits. The bladder is not well seen due to streak hardware artifact. Stomach/Bowel: A small hiatal hernia is noted. Stomach is within normal limits. Appendix is not seen. No evidence of bowel wall thickening, distention, or inflammatory changes. No free air or pneumatosis is seen. A right inguinal hernia is noted containing  nonobstructed small bowel. Vascular/Lymphatic: Aortic atherosclerosis. No enlarged abdominal or pelvic lymph nodes. Reproductive: Status post hysterectomy. No adnexal masses. Other: No abdominopelvic ascites. Musculoskeletal: Total hip arthroplasty changes are noted bilaterally. Degenerative changes are present in the thoracolumbar spine. No acute osseous abnormality is seen. IMPRESSION: 1. No acute process to explain reported left lower quadrant abdominal pain. 2. Vague hypodense region in the head of the pancreas with biliary and pancreatic ductal dilatation, concerning for possible pancreatic adenocarcinoma. MRI with pancreatic protocol and MRCP are recommended for further evaluation. 3. Small hiatal hernia. 4. Right inguinal hernia containing nonobstructed small bowel. 5. Aortic atherosclerosis. Electronically Signed   By: Thornell Sartorius M.D.   On: 06/13/2023 21:50    Microbiology: Results for orders placed or performed during the hospital encounter of 06/13/23  Urine Culture     Status: Abnormal   Collection Time: 06/13/23  8:50 PM   Specimen: Urine, Clean Catch  Result Value Ref Range Status   Specimen Description   Final    URINE, CLEAN CATCH Performed at [facility], [address] [place], [place] [address]    Special Requests   Final    NONE Performed at [facility], [address], [place], [place] [address]    Culture (A)  Final    >=100,000 COLONIES/mL ESCHERICHIA COLI >=100,000 COLONIES/mL PROTEUS MIRABILIS    Report Status 06/16/2023 FINAL  Final   Organism ID, Bacteria ESCHERICHIA COLI (A)  Final   Organism ID, Bacteria PROTEUS MIRABILIS (A)  Final      Susceptibility   Escherichia coli - MIC*    AMPICILLIN >=32 RESISTANT Resistant     CEFAZOLIN <=4 SENSITIVE Sensitive     CEFEPIME <=0.12 SENSITIVE Sensitive     CEFTRIAXONE <=  0.25 SENSITIVE Sensitive     CIPROFLOXACIN <=0.25 SENSITIVE Sensitive     GENTAMICIN <=1 SENSITIVE  Sensitive     IMIPENEM 0.5 SENSITIVE Sensitive     NITROFURANTOIN <=16 SENSITIVE Sensitive     TRIMETH/SULFA <=20 SENSITIVE Sensitive     AMPICILLIN/SULBACTAM 16 INTERMEDIATE Intermediate     PIP/TAZO <=4 SENSITIVE Sensitive ug/mL    * >=100,000 COLONIES/mL ESCHERICHIA COLI   Proteus mirabilis - MIC*    AMPICILLIN <=2 SENSITIVE Sensitive     CEFAZOLIN <=4 SENSITIVE Sensitive     CEFEPIME <=0.12 SENSITIVE Sensitive     CEFTRIAXONE <=0.25 SENSITIVE Sensitive     CIPROFLOXACIN <=0.25 SENSITIVE Sensitive     GENTAMICIN <=1 SENSITIVE Sensitive     IMIPENEM 2 SENSITIVE Sensitive     NITROFURANTOIN 128 RESISTANT Resistant     TRIMETH/SULFA <=20 SENSITIVE Sensitive     AMPICILLIN/SULBACTAM <=2 SENSITIVE Sensitive     PIP/TAZO <=4 SENSITIVE Sensitive ug/mL    * >=100,000 COLONIES/mL PROTEUS MIRABILIS    Labs: CBC: Recent Labs  Lab 06/13/23 1837 06/14/23 0702 06/15/23 0717 06/16/23 0609  WBC 8.5 7.0 10.0 9.6  HGB 12.9 11.6* 12.2 11.9*  HCT 39.1 34.2* 36.9 37.3  MCV 92.9 93.7 93.7 95.9  PLT 195 202 219 220   Basic Metabolic Panel: Recent Labs  Lab 06/13/23 1837 06/14/23 0702 06/15/23 0717 06/16/23 0609  NA 137 139 136 139  K 4.4 3.6 3.9 3.6  CL 103 107 102 104  CO2 25 23 24 26   GLUCOSE 200* 161* 194* 242*  BUN 11 10 10 15   CREATININE 0.97 0.79 1.08* 0.97  CALCIUM 10.0 9.3 9.4 9.3   Liver Function Tests: Recent Labs  Lab 06/13/23 1837 06/14/23 0702 06/15/23 0717 06/16/23 0609  AST 213* 208* 190* 116*  ALT 359* 304* 330* 257*  ALKPHOS 526* 419* 458* 385*  BILITOT 6.1* 6.7* 2.8* 1.8*  PROT 8.3* 7.0 7.5 6.9  ALBUMIN 4.2 3.3* 3.5 3.2*   CBG: No results for input(s): "GLUCAP" in the last 168 hours.  Discharge time spent: less than 30 minutes.  Signed: Rickey Barbara, MD [facility] 06/16/2023

## 2023-06-16 NOTE — Telephone Encounter (Signed)
IR evaluate and treat ordered. PTC through IR ordered. Radiology scheduling notified.

## 2023-06-16 NOTE — Progress Notes (Addendum)
Patient ID: Adrienne [name], female   DOB: March 04, 1946, 77 y.o.   MRN: [id number]    Progress Note   Subjective  Day #3 CC: New obstructive jaundice, pancreatic head mass IV Zosyn  CA 19-9 pending Labs today-WBC 9.6/hemoglobin 11.9/hematocrit 37.3 INR 0.9 BUN 15/creatinine 0.97 T. bili 1.8/alk phos 385/AST 116/ALT 257 improved  ERCP yesterday, gastritis, esophageal stenosis dilated 40 [place] (had complaint of dysphagia), unable to cannulate bile duct, 4 Fr 3 cm stent placed into the pancreatic duct  Patient says she feels fine, no abdominal pain. Did not have any abdominal discomfort after the ERCP.  She is asking whether she could be discharged from the hospital for Christmas and have what ever procedures she needs to have done after Christmas.  We briefly discussed PTC and she is asking if possible that this be set up after Christmas to allow her some days at home   Objective   Vital signs in last 24 hours: Temp:  [98.2 F (36.8 C)-98.8 F (37.1 C)] 98.4 F (36.9 C) (12/23 0512) Pulse Rate:  [50-54] 50 (12/23 0512) Resp:  [14-16] 14 (12/23 0512) BP: (120-181)/(71-85) 156/84 (12/23 0512) SpO2:  [99 %-100 %] 99 % (12/23 0512) Last BM Date : 06/13/23 General:  Elderly AA female in NAD Heart:  Regular rate and rhythm; no murmurs Lungs: Respirations even and unlabored, lungs CTA bilaterally Abdomen:  Soft, nontender and nondistended. Normal bowel sounds. Extremities:  Without edema. Neurologic:  Alert and oriented,  grossly normal neurologically. Psych:  Cooperative. Normal mood and affect.  Intake/Output from previous day: 12/22 0701 - 12/23 0700 In: 1333 [P.O.:1200; IV Piggyback:133] Out: -  Intake/Output this shift: No intake/output data recorded.  Lab Results: Recent Labs    06/14/23 0702 06/15/23 0717 06/16/23 0609  WBC 7.0 10.0 9.6  HGB 11.6* 12.2 11.9*  HCT 34.2* 36.9 37.3  PLT 202 219 220   BMET Recent Labs    06/14/23 0702 06/15/23 0717  06/16/23 0609  NA 139 136 139  K 3.6 3.9 3.6  CL 107 102 104  CO2 23 24 26   GLUCOSE 161* 194* 242*  BUN 10 10 15   CREATININE 0.79 1.08* 0.97  CALCIUM 9.3 9.4 9.3   LFT Recent Labs    06/16/23 0609  PROT 6.9  ALBUMIN 3.2*  AST 116*  ALT 257*  ALKPHOS 385*  BILITOT 1.8*   PT/INR Recent Labs    06/14/23 0702 06/16/23 0609  LABPROT 13.0 12.4  INR 1.0 0.9    Studies/Results: DG ERCP Result Date: 06/14/2023 CLINICAL DATA:  Acalculous cholecystitis; probable malignant biliary stricture EXAM: ERCP TECHNIQUE: Multiple spot images obtained with the fluoroscopic device and submitted for interpretation post-procedure. FLUOROSCOPY: Radiation Exposure Index (as provided by the fluoroscopic device): 84.7 MGy Kerma COMPARISON:  MRCP 06/14/2023 FINDINGS: A total of 9 intraoperative spot images are submitted for review. The images demonstrate cannulation of the pancreatic duct with balloon occluded ductogram. There is diffuse pancreatic ductal dilatation. The final images demonstrate placement of a temporary plastic pancreatic duct stent. IMPRESSION: 1. Diffuse pancreatic ductal dilatation. 2. Placement of a plastic pancreatic duct stent. These images were submitted for radiologic interpretation only. Please see the procedural report for the amount of contrast and the fluoroscopy time utilized. Electronically Signed   By: Malachy Moan M.D.   On: 06/14/2023 19:52       Assessment / Plan:    #53 77 year old female with new obstructive jaundice and imaging findings consistent with pancreatic head mass with  biliary obstruction. No evidence for metastatic disease by MRCP  Hepatic parameters have actually improved since admission  ERCP unsuccessful, unable to cannulate the common bile duct, a temporary pancreatic duct stent was placed  IR is to see patient this morning for consideration of PTC  Patient is asking if she could be discharged from the hospital and arrange for PTC to be done  after Christmas.  I think this is certainly reasonable as LFTs have improved, she has not had any evidence of infection. Will discuss with IR  She will need a KUB probably on Friday to assess that pancreatic duct stent has come out.  Addendum-discussed with patient at bedside, okay for regular diet Okay to be discharged home today We have arranged for CBC and c-Met for Monday, 06/23/2023 and she will also have KUB at our office that day She has appointment scheduled with Doug Sou, PA-C on 06/26/2023 at 11:30 AM third-floor [facility]  Patient is also been instructed that if she develops fever, shaking chills, nausea vomiting rapidly darkening urine or worsening abdominal pain that she should return to the emergency room in the interim.  Will need to coordinate with IR regarding scheduling for PTC based on labs on 06/23/2023   Principal Problem:   Pancreatic mass Active Problems:   Obstructive jaundice   Gastritis and gastroduodenitis   Esophageal stenosis    LOS: 3 days   Amy Esterwood PA-C 06/16/2023, 8:55 AM    Attending Physician Note   I have taken an interval history, reviewed the chart and examined the patient. I performed a substantive portion of this encounter, including complete performance of at least one of the key components, in conjunction with the APP. I agree with the APP's note, impression and recommendations with my edits. My additional impressions and recommendations are as follows.   Obstructive jaundice. Pancreatic head mass. ERCP yesterday unable to access CBD, PD stent placed. Unexpectedly her LFTs are improving. No abdominal pain.   Outpatient GI follow up with Dr. Lavon Paganini OK for discharge today with repeat LFTs and KUB on Monday  GI office appt Thursday - consider PTC vs re-attempt ERCP  GI signing off   Claudette Head, MD [facility], [facility], for our on call provider

## 2023-06-16 NOTE — Plan of Care (Signed)

## 2023-06-16 NOTE — Telephone Encounter (Signed)
-----   Message from Mike Gip sent at 06/16/2023 10:25 AM EST ----- Regarding: labs, appt and referral to Oncology Nice lady known to Dr. Lavon Paganini from prior colonoscopy came in with new obstructive jaundice and has pancreatic head mass Dr. Leone Payor attempted ERCP over the weekend, this was unsuccessful, could not cannulate duct but did place a temporary pancreatic duct stent  Interestingly her LFTs have come down without any intervention, she feels fine did not really have any abdominal pain on admission and wants to go home for Christmas  IR had been consulted to consider PTC, they agree that with LFTs continuing to improve that it may not even be appropriate to place PTC at this time.  Plan is to let her go home, she needs a CBC and c-Met on Monday, 06/23/2023 under Dr. Elana Alm name  IR said that they can see her next week if needed for [facility] which would then have to be scheduled, alternatively it was discussed about considering repeat ERCP .  She also needs an Oncology referral for new diagnosis of pancreatic malignancy-please  initiate thank you

## 2023-06-18 ENCOUNTER — Encounter (HOSPITAL_COMMUNITY): Payer: Self-pay | Admitting: Internal Medicine

## 2023-06-19 LAB — SURGICAL PATHOLOGY

## 2023-06-23 ENCOUNTER — Ambulatory Visit
Admission: RE | Admit: 2023-06-23 | Discharge: 2023-06-23 | Disposition: A | Discharge status: Home / Self Care | Payer: PPO | Source: Ambulatory Visit | Attending: Gastroenterology

## 2023-06-23 DIAGNOSIS — Z471 Aftercare following joint replacement surgery: Secondary | ICD-10-CM | POA: Diagnosis not present

## 2023-06-23 DIAGNOSIS — Z4682 Encounter for fitting and adjustment of non-vascular catheter: Secondary | ICD-10-CM | POA: Diagnosis not present

## 2023-06-23 DIAGNOSIS — C250 Malignant neoplasm of head of pancreas: Secondary | ICD-10-CM | POA: Diagnosis not present

## 2023-06-24 ENCOUNTER — Ambulatory Visit (INDEPENDENT_AMBULATORY_CARE_PROVIDER_SITE_OTHER): Payer: PPO | Admitting: Internal Medicine

## 2023-06-24 ENCOUNTER — Other Ambulatory Visit (HOSPITAL_COMMUNITY): Payer: Self-pay | Admitting: Internal Medicine

## 2023-06-24 ENCOUNTER — Encounter: Payer: Self-pay | Admitting: Internal Medicine

## 2023-06-24 VITALS — BP 148/84 | HR 58 | Ht 63.0 in | Wt 169.0 lb

## 2023-06-24 DIAGNOSIS — Z23 Encounter for immunization: Secondary | ICD-10-CM

## 2023-06-24 DIAGNOSIS — Z7984 Long term (current) use of oral hypoglycemic drugs: Secondary | ICD-10-CM

## 2023-06-24 DIAGNOSIS — K8689 Other specified diseases of pancreas: Secondary | ICD-10-CM

## 2023-06-24 DIAGNOSIS — E1169 Type 2 diabetes mellitus with other specified complication: Secondary | ICD-10-CM

## 2023-06-24 DIAGNOSIS — I10 Essential (primary) hypertension: Secondary | ICD-10-CM | POA: Diagnosis not present

## 2023-06-24 DIAGNOSIS — E039 Hypothyroidism, unspecified: Secondary | ICD-10-CM

## 2023-06-24 DIAGNOSIS — M1712 Unilateral primary osteoarthritis, left knee: Secondary | ICD-10-CM | POA: Diagnosis not present

## 2023-06-24 DIAGNOSIS — Z09 Encounter for follow-up examination after completed treatment for conditions other than malignant neoplasm: Secondary | ICD-10-CM | POA: Insufficient documentation

## 2023-06-24 MED ORDER — BLOOD GLUCOSE TEST VI STRP: 1.0000 | ORAL_STRIP | Freq: Three times a day (TID) | 0 refills | Status: DC

## 2023-06-24 MED ORDER — LANCET DEVICE MISC: 1.0000 | Freq: Three times a day (TID) | 0 refills | Status: DC

## 2023-06-24 MED ORDER — BLOOD GLUCOSE MONITORING SUPPL DEVI: 1.0000 | Freq: Three times a day (TID) | 0 refills | Status: DC

## 2023-06-24 MED ORDER — CELECOXIB 100 MG PO CAPS: 100.0000 mg | ORAL_CAPSULE | Freq: Every day | ORAL | 3 refills | Status: DC

## 2023-06-24 NOTE — Progress Notes (Signed)
 Established Patient Office Visit  Subjective:  Patient ID: Adrienne [name], female    DOB: December 19, 1945  Age: 77 y.o. MRN: [id number]  CC:  Chief Complaint  Patient presents with   Follow-up    HPI [name] is a 77 y.o. female with past medical history of HTN, type 2 DM, nonishcemic CM, hypothyroidism, OA of hip and knee, HLD, breast ca. s/p right mastectomy who presents for f/u of her chronic medical conditions.  She was recently admitted at [facility] for abdominal discomfort and dark urine. CT abdomen pelvis showed hypodense region in head of pancreas with ductal dilation concerning for malignancy.  She had MRCP, which was highly concerning for pancreatic adenocarcinoma.  She had ERCP on 06/14/23, with placement of a plastic pancreatic duct stent.  She denies any nausea or vomiting currently.  Denies any diarrhea, melena or hematochezia.  HTN: BP is elevated today, but she has not had her medications yet. Takes Losartan  and Amlodipine  regularly. Coreg  was discontinued due to bradycardia. Patient denies headache, dizziness, chest pain, dyspnea or palpitations.  Hypothyroidism: She takes NP thyroid  90 mg daily currently.  She has history of oversupplemented thyroid  in the past, but recent TSH has been better.  She denies any recent change in appetite, weight, constipation, tremors or palpitations.  Type 2 DM: Her HbA1c was 7.0 in 08/24.  She was placed on metformin  500 mg once daily, but she has stopped taking it due to constipation (?).  She prefers to manage it with diet alone.  Denies any polyuria or polydipsia currently.  Nonischemic CM: She has been evaluated by cardiology.  She recently had echocardiogram, which showed MR with mild MVP.   HLD: She has hot intolerance to statins.  Her LDL was 145 in 04/24.  She had Pharm.D. visit for PCSK9 inhibitor therapy.  She was approved for Leqvio , but canceled her appointment for the injection due to very high  co-pay.      Past Medical History:  Diagnosis Date   Allergy    Ambulates with cane    straight cane   Arthritis    knee, back   CHF (congestive heart failure) (HCC)    Ductal carcinoma in situ of breast 12/2018   R Breast-mastectomy only   Gout    History of blood transfusion 1986   w/ Hysterectomy surgery   Hyperlipidemia    diet controlled - no meds   Hypertension    Hypothyroidism    Pelvic kidney    lower right pelvic kidney    SBO (small bowel obstruction) (HCC) 10/2016   surgery    Sleep apnea    Mild - no mask needed per sleep study   Smoker    quit smoking 2018   Type 2 diabetes mellitus (HCC)     Past Surgical History:  Procedure Laterality Date   ABDOMINAL HYSTERECTOMY  1986   COMPLETE-precancerous   APPENDECTOMY     pt states it was removed when gallbladder was removed.   AUGMENTATION MAMMAPLASTY     BACK SURGERY     lower back   BIOPSY  06/14/2023   Procedure: BIOPSY;  Surgeon: Avram Lupita BRAVO, MD;  Location: [facility];  Service: Gastroenterology;;   BREAST BIOPSY Right 05/12/2019   times 2   BREAST LUMPECTOMY WITH RADIOACTIVE SEED LOCALIZATION Right 03/18/2019   Procedure: RIGHT BREAST LUMPECTOMY WITH RADIOACTIVE SEED LOCALIZATION;  Surgeon: [name], MD;  Location: [facility];  Service: General;  Laterality: Right;  CHOLECYSTECTOMY     COLONOSCOPY  02/15/2008   Kaplan    COLONOSCOPY WITH PROPOFOL   02/27/2018   Dr.Nandigam   ECTOPIC PREGNANCY SURGERY     ERCP N/A 06/14/2023   Procedure: ENDOSCOPIC RETROGRADE CHOLANGIOPANCREATOGRAPHY (ERCP);  Surgeon: Avram Lupita BRAVO, MD;  Location: [facility];  Service: Gastroenterology;  Laterality: N/A;   ESOPHAGOGASTRODUODENOSCOPY (EGD) WITH PROPOFOL  N/A 06/14/2023   Procedure: ESOPHAGOGASTRODUODENOSCOPY (EGD) WITH PROPOFOL ;  Surgeon: Avram Lupita BRAVO, MD;  Location: [facility];  Service: Gastroenterology;  Laterality: N/A;   JOINT REPLACEMENT     Left hip total Dr. Melodi 10-01-17   MALONEY  DILATION  06/14/2023   Procedure: AGAPITO DILATION;  Surgeon: Avram Lupita BRAVO, MD;  Location: [facility];  Service: Gastroenterology;;   MASTECTOMY Right 07/26/2019   PANCREATIC STENT PLACEMENT  06/14/2023   Procedure: PANCREATIC STENT PLACEMENT;  Surgeon: Avram Lupita BRAVO, MD;  Location: [facility];  Service: Gastroenterology;;   POLYPECTOMY     polypectomy-oropharynx     RE-EXCISION OF BREAST CANCER,SUPERIOR MARGINS Right 04/08/2019   Procedure: RE-EXCISION OF RIGHT BREAST ANTERIOR MARGINS;  Surgeon: Curvin Mt III, MD;  Location: [facility];  Service: General;  Laterality: Right;   right knee meniscus     SBO  10/2016   small bowel obstruction   SIMPLE MASTECTOMY WITH AXILLARY SENTINEL NODE BIOPSY Right 07/26/2019   Procedure: RIGHT MASTECTOMY WITH SENTINEL NODE BIOPSY;  Surgeon: Curvin Mt MOULD, MD;  Location: [facility];  Service: General;  Laterality: Right;   TOTAL HIP ARTHROPLASTY Left 10/01/2017   Procedure: LEFT  TOTAL HIP ARTHROPLASTY ANTERIOR APPROACH;  Surgeon: Melodi Lerner, MD;  Location: [facility];  Service: Orthopedics;  Laterality: Left;   TOTAL HIP ARTHROPLASTY Right 03/11/2018   Procedure: RIGHT TOTAL HIP ARTHROPLASTY ANTERIOR APPROACH;  Surgeon: Melodi Lerner, MD;  Location: [facility];  Service: Orthopedics;  Laterality: Right;    UPPER GASTROINTESTINAL ENDOSCOPY      Family History  Problem Relation Age of Onset   Cancer Sister    Diabetes Brother    Diabetes Brother    Hypertension Other    Breast cancer Sister    Colon cancer Neg Hx    Colon polyps Neg Hx    Rectal cancer Neg Hx    Stomach cancer Neg Hx     Social History   Socioeconomic History   Marital status: Widowed    Spouse name: Not on file   Number of children: 3   Years of education: 49   Highest education level: Some college, no degree  Occupational History   Occupation: retired  Tobacco Use   Smoking status: Former    Current packs/day: 0.00    Average packs/day: 1  pack/day for 32.0 years (32.0 ttl pk-yrs)    Types: E-cigarettes, Cigarettes    Start date: 11/20/1984    Quit date: 11/20/2016    Years since quitting: 6.5   Smokeless tobacco: Never   Tobacco comments:    Smoker since 1975 has quit and started back several times!!,  Vaping Use   Vaping status: Former  Substance and Sexual Activity   Alcohol use: No   Drug use: No   Sexual activity: Not Currently    Birth control/protection: Surgical    Comment: Hysterectomy  Other Topics Concern   Not on file  Social History Narrative   Widow since 2006.Married previously for 25 years.Lives with grandson and 2 great grandchildren.Retired.Previously office work.   Social Drivers of Corporate Investment Banker Strain:  Low Risk  (06/27/2021)   Overall Financial Resource Strain (CARDIA)    Difficulty of Paying Living Expenses: Not hard at all  Food Insecurity: No Food Insecurity (06/14/2023)   Hunger Vital Sign    Worried About Running Out of Food in the Last Year: Never true    Ran Out of Food in the Last Year: Never true  Transportation Needs: No Transportation Needs (06/14/2023)   PRAPARE - Administrator, Civil Service (Medical): No    Lack of Transportation (Non-Medical): No  Physical Activity: Inactive (06/27/2021)   Exercise Vital Sign    Days of Exercise per Week: 0 days    Minutes of Exercise per Session: 0 min  Stress: No Stress Concern Present (06/27/2021)   [employer] of Occupational Health - Occupational Stress Questionnaire    Feeling of Stress : Not at all  Social Connections: Moderately Integrated (06/27/2021)   Social Connection and Isolation Panel [NHANES]    Frequency of Communication with Friends and Family: More than three times a week    Frequency of Social Gatherings with Friends and Family: Once a week    Attends Religious Services: More than 4 times per year    Active Member of [employer] or Organizations: Yes    Attends Banker Meetings: More than  4 times per year    Marital Status: Widowed  Intimate Partner Violence: Not At Risk (06/14/2023)   Humiliation, Afraid, Rape, and Kick questionnaire    Fear of Current or Ex-Partner: No    Emotionally Abused: No    Physically Abused: No    Sexually Abused: No    Outpatient Medications Prior to Visit  Medication Sig Dispense Refill   amLODipine  (NORVASC ) 10 MG tablet Take 1 tablet by mouth once daily 90 tablet 0   cetirizine  (ZYRTEC ) 10 MG tablet Take 1 tablet (10 mg total) by mouth daily. (Patient taking differently: Take 10 mg by mouth daily as needed for allergies.) 30 tablet 11   Cholecalciferol (VITAMIN D -3) 125 MCG (5000 UT) TABS Take 2 tablets by mouth daily.     famotidine  (PEPCID ) 20 MG tablet Take 1 tablet (20 mg total) by mouth 2 (two) times daily. 30 tablet 1   fluticasone  (FLONASE ) 50 MCG/ACT nasal spray Place 2 sprays into both nostrils daily. 16 g 6   losartan  (COZAAR ) 100 MG tablet Take 1 tablet by mouth once daily 90 tablet 0   metFORMIN  (GLUCOPHAGE -XR) 500 MG 24 hr tablet Take 1 tablet (500 mg total) by mouth daily with breakfast. 30 tablet 5   ondansetron  (ZOFRAN ) 4 MG tablet Take 1 tablet (4 mg total) by mouth every 6 (six) hours as needed for nausea. 20 tablet 0   sodium chloride  (OCEAN) 0.65 % SOLN nasal spray Place 1 spray into both nostrils as needed. 30 mL 5   thyroid  (NP THYROID ) 90 MG tablet Take 1 tablet (90 mg total) by mouth daily. 30 tablet 5   No facility-administered medications prior to visit.    Allergies  Allergen Reactions   Lisinopril  Swelling    Angioedema   Statins     Myalgias   Atorvastatin  Other (See Comments)    Muscle aches     ROS Review of Systems  Constitutional:  Negative for chills and fever.  HENT:  Positive for tinnitus. Negative for congestion, sinus pressure, sinus pain and sore throat.   Eyes:  Positive for visual disturbance. Negative for pain and discharge.  Respiratory:  Negative for cough and  shortness of breath.    Cardiovascular:  Negative for chest pain and palpitations.  Gastrointestinal:  Negative for abdominal pain, diarrhea, nausea and vomiting.  Endocrine: Negative for polydipsia and polyuria.  Genitourinary:  Negative for dysuria and hematuria.  Musculoskeletal:  Positive for arthralgias. Negative for neck pain and neck stiffness.  Skin:  Negative for rash.  Neurological:  Negative for dizziness and weakness.  Psychiatric/Behavioral:  Negative for agitation and behavioral problems.       Objective:    Physical Exam Vitals reviewed.  Constitutional:      General: She is not in acute distress.    Appearance: She is obese. She is not diaphoretic.  HENT:     Head: Normocephalic and atraumatic.     Nose: Nose normal.     Mouth/Throat:     Mouth: Mucous membranes are moist.  Eyes:     General: No scleral icterus.    Extraocular Movements: Extraocular movements intact.  Cardiovascular:     Rate and Rhythm: Normal rate and regular rhythm.     Pulses: Normal pulses.     Heart sounds: Normal heart sounds. No murmur heard. Pulmonary:     Breath sounds: Normal breath sounds. No wheezing or rales.  Abdominal:     Palpations: Abdomen is soft.     Tenderness: There is no abdominal tenderness.  Musculoskeletal:     Cervical back: Neck supple. No tenderness.     Right lower leg: No edema.     Left lower leg: No edema.  Skin:    General: Skin is warm.  Neurological:     General: No focal deficit present.     Mental Status: She is alert and oriented to person, place, and time.     Sensory: No sensory deficit.     Motor: No weakness.  Psychiatric:        Mood and Affect: Mood normal.        Behavior: Behavior normal.     BP (!) 148/84 (BP Location: Left Arm)   Pulse (!) 58   Ht 5' 3 (1.6 m)   Wt 169 lb (76.7 kg)   SpO2 93%   BMI 29.94 kg/m  Wt Readings from Last 3 Encounters:  06/24/23 169 lb (76.7 kg)  06/13/23 175 lb (79.4 kg)  05/15/23 178 lb (80.7 kg)    Lab Results   Component Value Date   TSH 0.308 (L) 02/13/2023   Lab Results  Component Value Date   WBC 9.6 06/16/2023   HGB 11.9 (L) 06/16/2023   HCT 37.3 06/16/2023   MCV 95.9 06/16/2023   PLT 220 06/16/2023   Lab Results  Component Value Date   NA 139 06/16/2023   K 3.6 06/16/2023   CO2 26 06/16/2023   GLUCOSE 242 (H) 06/16/2023   BUN 15 06/16/2023   CREATININE 0.97 06/16/2023   BILITOT 1.8 (H) 06/16/2023   ALKPHOS 385 (H) 06/16/2023   AST 116 (H) 06/16/2023   ALT 257 (H) 06/16/2023   PROT 6.9 06/16/2023   ALBUMIN 3.2 (L) 06/16/2023   CALCIUM  9.3 06/16/2023   ANIONGAP 9 06/16/2023   EGFR 55 (L) 02/13/2023   GFR 74.79 07/15/2013   Lab Results  Component Value Date   CHOL 219 (H) 10/10/2022   Lab Results  Component Value Date   HDL 53 10/10/2022   Lab Results  Component Value Date   LDLCALC 145 (H) 10/10/2022   Lab Results  Component Value Date   TRIG 118 10/10/2022  Lab Results  Component Value Date   CHOLHDL 4.1 10/10/2022   Lab Results  Component Value Date   HGBA1C 7.0 (H) 02/13/2023      Assessment & Plan:   Problem List Items Addressed This Visit       Cardiovascular and Mediastinum   Hypertension   BP Readings from Last 1 Encounters:  06/24/23 (!) 148/84   Usually well-controlled with Losartan  100 mg QD and amlodipine  10 mg daily Elevated today as she has not had her meds yet Counseled for compliance with the medications Advised DASH diet and moderate exercise/walking, at least 150 mins/week      Relevant Orders   CBC with Differential/Platelet   CMP14+EGFR     Endocrine   Hypothyroidism   Lab Results  Component Value Date   TSH 0.308 (L) 02/13/2023   On NP thyroid  90 mg QD, had decreased dose in the past due to oversupplementation Discussed about switching to Levothyroxine , she would prefer to stay on NP thyroid  for now Check TSH and free T4      Relevant Orders   TSH + free T4   Type 2 diabetes mellitus with other specified  complication (HCC) - Primary   Lab Results  Component Value Date   HGBA1C 7.0 (H) 02/13/2023   Uncontrolled due to noncompliance Associated with HTN and HLD Was placed on Metformin  500 mg QD now, but did not continue as she prefers to manage it with diet - advised to restart Metformin  Avoid sulfonylureas for incretin analogs due to pancreatic mass Advised to follow diabetic diet On ARB F/u CMP and lipid panel Diabetic eye exam: Advised to follow up with Ophthalmology for diabetic eye exam       Relevant Medications   Blood Glucose Monitoring Suppl DEVI   Glucose Blood (BLOOD GLUCOSE TEST STRIPS) STRP   Lancet Device MISC   Other Relevant Orders   CMP14+EGFR   Hemoglobin A1c   Urine Microalbumin w/creat. ratio     Musculoskeletal and Integument   Primary osteoarthritis of left knee   Restart Celecoxib  Due to her liver enzymes being elevated, avoid Tylenol  for now      Relevant Medications   celecoxib  (CELEBREX ) 100 MG capsule     Other   Pancreatic mass   Recent hospitalization for abdominal discomfort Had CT abdomen, MRCP and later ERCP -pancreatic stent placement Followed by GI Going to see Oncology as well      Hospital discharge follow-up   Hospital chart reviewed, including discharge summary Medications reconciled and reviewed with the patient in detail Recheck CMP      Other Visit Diagnoses       Encounter for immunization       Relevant Orders   Flu Vaccine Trivalent High Dose (Fluad) (Completed)       Meds ordered this encounter  Medications   Blood Glucose Monitoring Suppl DEVI    Sig: 1 each by Does not apply route in the morning, at noon, and at bedtime. May substitute to any manufacturer covered by patient's insurance.    Dispense:  1 each    Refill:  0   Glucose Blood (BLOOD GLUCOSE TEST STRIPS) STRP    Sig: 1 each by In Vitro route in the morning, at noon, and at bedtime. May substitute to any manufacturer covered by patient's insurance.     Dispense:  100 strip    Refill:  0   Lancet Device MISC    Sig: 1 each by Does not  apply route in the morning, at noon, and at bedtime. May substitute to any manufacturer covered by patient's insurance.    Dispense:  1 each    Refill:  0   celecoxib  (CELEBREX ) 100 MG capsule    Sig: Take 1 capsule (100 mg total) by mouth daily.    Dispense:  30 capsule    Refill:  3    Follow-up: Return in about 4 months (around 10/22/2023) for HTN and DM.    Suzzane MARLA Blanch, MD

## 2023-06-24 NOTE — Assessment & Plan Note (Signed)
 Lab Results  Component Value Date   TSH 0.308 (L) 02/13/2023   On NP thyroid  90 mg QD, had decreased dose in the past due to oversupplementation Discussed about switching to Levothyroxine , she would prefer to stay on NP thyroid  for now Check TSH and free T4

## 2023-06-24 NOTE — Assessment & Plan Note (Signed)
Recent hospitalization for abdominal discomfort Had CT abdomen, MRCP and later ERCP -pancreatic stent placement Followed by GI Going to see Oncology as well

## 2023-06-24 NOTE — Assessment & Plan Note (Addendum)
 Hospital chart reviewed, including discharge summary Medications reconciled and reviewed with the patient in detail Recheck CMP

## 2023-06-24 NOTE — Patient Instructions (Signed)
Please continue to take medications as prescribed.  Please continue to follow low carb diet and ambulate as tolerated. 

## 2023-06-24 NOTE — Assessment & Plan Note (Addendum)
 Restart Celecoxib Due to her liver enzymes being elevated, avoid Tylenol for now

## 2023-06-24 NOTE — Assessment & Plan Note (Addendum)
 Lab Results  Component Value Date   HGBA1C 7.0 (H) 02/13/2023   Uncontrolled due to noncompliance Associated with HTN and HLD Was placed on Metformin  500 mg QD now, but did not continue as she prefers to manage it with diet - advised to restart Metformin  Avoid sulfonylureas for incretin analogs due to pancreatic mass Advised to follow diabetic diet On ARB F/u CMP and lipid panel Diabetic eye exam: Advised to follow up with Ophthalmology for diabetic eye exam

## 2023-06-24 NOTE — Assessment & Plan Note (Addendum)
 BP Readings from Last 1 Encounters:  06/24/23 (!) 148/84   Usually well-controlled with Losartan  100 mg QD and amlodipine  10 mg daily Elevated today as she has not had her meds yet Counseled for compliance with the medications Advised DASH diet and moderate exercise/walking, at least 150 mins/week

## 2023-06-25 LAB — CBC WITH DIFFERENTIAL/PLATELET
Basophils Absolute: 0 10*3/uL (ref 0.0–0.2)
Basos: 1 %
EOS (ABSOLUTE): 0.1 10*3/uL (ref 0.0–0.4)
Eos: 1 %
Hematocrit: 38.6 % (ref 34.0–46.6)
Hemoglobin: 12.4 g/dL (ref 11.1–15.9)
Immature Grans (Abs): 0 10*3/uL (ref 0.0–0.1)
Immature Granulocytes: 1 %
Lymphocytes Absolute: 2.3 10*3/uL (ref 0.7–3.1)
Lymphs: 27 %
MCH: 30.6 pg (ref 26.6–33.0)
MCHC: 32.1 g/dL (ref 31.5–35.7)
MCV: 95 fL (ref 79–97)
Monocytes Absolute: 0.5 10*3/uL (ref 0.1–0.9)
Monocytes: 6 %
Neutrophils Absolute: 5.4 10*3/uL (ref 1.4–7.0)
Neutrophils: 64 %
Platelets: 248 10*3/uL (ref 150–450)
RBC: 4.05 x10E6/uL (ref 3.77–5.28)
RDW: 13.1 % (ref 11.7–15.4)
WBC: 8.3 10*3/uL (ref 3.4–10.8)

## 2023-06-25 LAB — CMP14+EGFR
ALT: 561 [IU]/L (ref 0–32)
AST: 266 [IU]/L — ABNORMAL HIGH (ref 0–40)
Albumin: 4.2 g/dL (ref 3.8–4.8)
Alkaline Phosphatase: 1379 [IU]/L (ref 44–121)
BUN/Creatinine Ratio: 15 (ref 12–28)
BUN: 19 mg/dL (ref 8–27)
Bilirubin Total: 1.8 mg/dL — ABNORMAL HIGH (ref 0.0–1.2)
CO2: 21 mmol/L (ref 20–29)
Calcium: 9.8 mg/dL (ref 8.7–10.3)
Chloride: 102 mmol/L (ref 96–106)
Creatinine, Ser: 1.31 mg/dL — ABNORMAL HIGH (ref 0.57–1.00)
Globulin, Total: 3.2 g/dL (ref 1.5–4.5)
Glucose: 374 mg/dL — ABNORMAL HIGH (ref 70–99)
Potassium: 4.3 mmol/L (ref 3.5–5.2)
Sodium: 142 mmol/L (ref 134–144)
Total Protein: 7.4 g/dL (ref 6.0–8.5)
eGFR: 42 mL/min/{1.73_m2} — ABNORMAL LOW (ref >59)

## 2023-06-25 LAB — HEMOGLOBIN A1C
Est. average glucose Bld gHb Est-mCnc: 186 mg/dL
Hgb A1c MFr Bld: 8.1 % — ABNORMAL HIGH (ref 4.8–5.6)

## 2023-06-25 LAB — TSH+FREE T4
Free T4: 0.78 ng/dL — ABNORMAL LOW (ref 0.82–1.77)
TSH: 3.76 u[IU]/mL (ref 0.450–4.500)

## 2023-06-26 ENCOUNTER — Inpatient Hospital Stay (HOSPITAL_COMMUNITY)
Admission: EM | Admit: 2023-06-26 | Discharge: 2023-06-28 | DRG: 445 | Disposition: A | Discharge status: Home / Self Care | Payer: PPO | Attending: Internal Medicine | Admitting: Internal Medicine

## 2023-06-26 ENCOUNTER — Other Ambulatory Visit: Payer: Self-pay

## 2023-06-26 ENCOUNTER — Encounter (HOSPITAL_COMMUNITY): Payer: Self-pay | Admitting: Internal Medicine

## 2023-06-26 ENCOUNTER — Ambulatory Visit: Payer: PPO | Admitting: Gastroenterology

## 2023-06-26 ENCOUNTER — Inpatient Hospital Stay (HOSPITAL_COMMUNITY): Payer: PPO

## 2023-06-26 ENCOUNTER — Encounter: Payer: Self-pay | Admitting: Internal Medicine

## 2023-06-26 ENCOUNTER — Telehealth: Payer: Self-pay | Admitting: Internal Medicine

## 2023-06-26 DIAGNOSIS — K831 Obstruction of bile duct: Secondary | ICD-10-CM | POA: Diagnosis not present

## 2023-06-26 DIAGNOSIS — I428 Other cardiomyopathies: Secondary | ICD-10-CM | POA: Diagnosis not present

## 2023-06-26 DIAGNOSIS — R739 Hyperglycemia, unspecified: Secondary | ICD-10-CM

## 2023-06-26 DIAGNOSIS — E1165 Type 2 diabetes mellitus with hyperglycemia: Secondary | ICD-10-CM | POA: Diagnosis not present

## 2023-06-26 DIAGNOSIS — C259 Malignant neoplasm of pancreas, unspecified: Secondary | ICD-10-CM | POA: Diagnosis present

## 2023-06-26 DIAGNOSIS — I509 Heart failure, unspecified: Secondary | ICD-10-CM | POA: Diagnosis not present

## 2023-06-26 DIAGNOSIS — K838 Other specified diseases of biliary tract: Secondary | ICD-10-CM

## 2023-06-26 DIAGNOSIS — Z96643 Presence of artificial hip joint, bilateral: Secondary | ICD-10-CM | POA: Diagnosis present

## 2023-06-26 DIAGNOSIS — E039 Hypothyroidism, unspecified: Secondary | ICD-10-CM | POA: Diagnosis not present

## 2023-06-26 DIAGNOSIS — I5022 Chronic systolic (congestive) heart failure: Secondary | ICD-10-CM | POA: Diagnosis not present

## 2023-06-26 DIAGNOSIS — G4730 Sleep apnea, unspecified: Secondary | ICD-10-CM | POA: Diagnosis present

## 2023-06-26 DIAGNOSIS — R748 Abnormal levels of other serum enzymes: Secondary | ICD-10-CM | POA: Diagnosis present

## 2023-06-26 DIAGNOSIS — K219 Gastro-esophageal reflux disease without esophagitis: Secondary | ICD-10-CM | POA: Diagnosis not present

## 2023-06-26 DIAGNOSIS — Z8249 Family history of ischemic heart disease and other diseases of the circulatory system: Secondary | ICD-10-CM

## 2023-06-26 DIAGNOSIS — E1169 Type 2 diabetes mellitus with other specified complication: Secondary | ICD-10-CM

## 2023-06-26 DIAGNOSIS — Z122 Encounter for screening for malignant neoplasm of respiratory organs: Secondary | ICD-10-CM

## 2023-06-26 DIAGNOSIS — R001 Bradycardia, unspecified: Secondary | ICD-10-CM | POA: Diagnosis not present

## 2023-06-26 DIAGNOSIS — R509 Fever, unspecified: Secondary | ICD-10-CM | POA: Diagnosis not present

## 2023-06-26 DIAGNOSIS — K8689 Other specified diseases of pancreas: Secondary | ICD-10-CM | POA: Diagnosis not present

## 2023-06-26 DIAGNOSIS — B9681 Helicobacter pylori [H. pylori] as the cause of diseases classified elsewhere: Secondary | ICD-10-CM | POA: Insufficient documentation

## 2023-06-26 DIAGNOSIS — Z87891 Personal history of nicotine dependence: Secondary | ICD-10-CM | POA: Diagnosis not present

## 2023-06-26 DIAGNOSIS — M1990 Unspecified osteoarthritis, unspecified site: Secondary | ICD-10-CM | POA: Diagnosis present

## 2023-06-26 DIAGNOSIS — R945 Abnormal results of liver function studies: Secondary | ICD-10-CM | POA: Diagnosis present

## 2023-06-26 DIAGNOSIS — Z794 Long term (current) use of insulin: Secondary | ICD-10-CM | POA: Diagnosis not present

## 2023-06-26 DIAGNOSIS — D72829 Elevated white blood cell count, unspecified: Secondary | ICD-10-CM | POA: Diagnosis present

## 2023-06-26 DIAGNOSIS — Z170 Estrogen receptor positive status [ER+]: Secondary | ICD-10-CM

## 2023-06-26 DIAGNOSIS — Z4659 Encounter for fitting and adjustment of other gastrointestinal appliance and device: Secondary | ICD-10-CM | POA: Diagnosis not present

## 2023-06-26 DIAGNOSIS — D0511 Intraductal carcinoma in situ of right breast: Secondary | ICD-10-CM | POA: Diagnosis present

## 2023-06-26 DIAGNOSIS — Z9011 Acquired absence of right breast and nipple: Secondary | ICD-10-CM

## 2023-06-26 DIAGNOSIS — Z79899 Other long term (current) drug therapy: Secondary | ICD-10-CM

## 2023-06-26 DIAGNOSIS — Z79890 Hormone replacement therapy (postmenopausal): Secondary | ICD-10-CM

## 2023-06-26 DIAGNOSIS — Z791 Long term (current) use of non-steroidal anti-inflammatories (NSAID): Secondary | ICD-10-CM

## 2023-06-26 DIAGNOSIS — Z86000 Personal history of in-situ neoplasm of breast: Secondary | ICD-10-CM | POA: Diagnosis not present

## 2023-06-26 DIAGNOSIS — C250 Malignant neoplasm of head of pancreas: Secondary | ICD-10-CM | POA: Diagnosis present

## 2023-06-26 DIAGNOSIS — Z833 Family history of diabetes mellitus: Secondary | ICD-10-CM | POA: Diagnosis not present

## 2023-06-26 DIAGNOSIS — Z803 Family history of malignant neoplasm of breast: Secondary | ICD-10-CM | POA: Diagnosis not present

## 2023-06-26 DIAGNOSIS — K2970 Gastritis, unspecified, without bleeding: Secondary | ICD-10-CM | POA: Diagnosis not present

## 2023-06-26 DIAGNOSIS — Z9049 Acquired absence of other specified parts of digestive tract: Secondary | ICD-10-CM

## 2023-06-26 DIAGNOSIS — I10 Essential (primary) hypertension: Secondary | ICD-10-CM | POA: Diagnosis present

## 2023-06-26 DIAGNOSIS — R1310 Dysphagia, unspecified: Secondary | ICD-10-CM | POA: Diagnosis not present

## 2023-06-26 DIAGNOSIS — I110 Hypertensive heart disease with heart failure: Secondary | ICD-10-CM | POA: Diagnosis present

## 2023-06-26 DIAGNOSIS — E785 Hyperlipidemia, unspecified: Secondary | ICD-10-CM | POA: Diagnosis not present

## 2023-06-26 DIAGNOSIS — I517 Cardiomegaly: Secondary | ICD-10-CM | POA: Diagnosis not present

## 2023-06-26 DIAGNOSIS — Z8719 Personal history of other diseases of the digestive system: Secondary | ICD-10-CM

## 2023-06-26 DIAGNOSIS — Z7984 Long term (current) use of oral hypoglycemic drugs: Secondary | ICD-10-CM | POA: Diagnosis not present

## 2023-06-26 DIAGNOSIS — Z90710 Acquired absence of both cervix and uterus: Secondary | ICD-10-CM

## 2023-06-26 DIAGNOSIS — Z888 Allergy status to other drugs, medicaments and biological substances status: Secondary | ICD-10-CM

## 2023-06-26 DIAGNOSIS — R7989 Other specified abnormal findings of blood chemistry: Secondary | ICD-10-CM

## 2023-06-26 DIAGNOSIS — R17 Unspecified jaundice: Secondary | ICD-10-CM | POA: Diagnosis not present

## 2023-06-26 LAB — CBC WITH DIFFERENTIAL/PLATELET
Abs Immature Granulocytes: 0.03 10*3/uL (ref 0.00–0.07)
Basophils Absolute: 0.1 10*3/uL (ref 0.0–0.1)
Basophils Relative: 0 %
Eosinophils Absolute: 0.1 10*3/uL (ref 0.0–0.5)
Eosinophils Relative: 0 %
HCT: 38 % (ref 36.0–46.0)
Hemoglobin: 12.3 g/dL (ref 12.0–15.0)
Immature Granulocytes: 0 %
Lymphocytes Relative: 30 %
Lymphs Abs: 3.4 10*3/uL (ref 0.7–4.0)
MCH: 31.4 pg (ref 26.0–34.0)
MCHC: 32.4 g/dL (ref 30.0–36.0)
MCV: 96.9 fL (ref 80.0–100.0)
Monocytes Absolute: 0.6 10*3/uL (ref 0.1–1.0)
Monocytes Relative: 5 %
Neutro Abs: 7.2 10*3/uL (ref 1.7–7.7)
Neutrophils Relative %: 65 %
Platelets: 255 10*3/uL (ref 150–400)
RBC: 3.92 MIL/uL (ref 3.87–5.11)
RDW: 14.6 % (ref 11.5–15.5)
WBC: 11.4 10*3/uL — ABNORMAL HIGH (ref 4.0–10.5)
nRBC: 0 % (ref 0.0–0.2)

## 2023-06-26 LAB — COMPREHENSIVE METABOLIC PANEL
ALT: 296 U/L — ABNORMAL HIGH (ref 0–44)
AST: 72 U/L — ABNORMAL HIGH (ref 15–41)
Albumin: 3.8 g/dL (ref 3.5–5.0)
Alkaline Phosphatase: 821 U/L — ABNORMAL HIGH (ref 38–126)
Anion gap: 9 (ref 5–15)
BUN: 23 mg/dL (ref 8–23)
CO2: 23 mmol/L (ref 22–32)
Calcium: 9.6 mg/dL (ref 8.9–10.3)
Chloride: 103 mmol/L (ref 98–111)
Creatinine, Ser: 1.22 mg/dL — ABNORMAL HIGH (ref 0.44–1.00)
GFR, Estimated: 46 mL/min — ABNORMAL LOW (ref >60)
Glucose, Bld: 569 mg/dL (ref 70–99)
Potassium: 3.5 mmol/L (ref 3.5–5.1)
Sodium: 135 mmol/L (ref 135–145)
Total Bilirubin: 1.6 mg/dL — ABNORMAL HIGH (ref 0.0–1.2)
Total Protein: 8 g/dL (ref 6.5–8.1)

## 2023-06-26 LAB — GLUCOSE, CAPILLARY: Glucose-Capillary: 148 mg/dL — ABNORMAL HIGH (ref 70–99)

## 2023-06-26 LAB — URINALYSIS, COMPLETE (UACMP) WITH MICROSCOPIC
Bacteria, UA: NONE SEEN
Bilirubin Urine: NEGATIVE
Glucose, UA: >=500 mg/dL — AB
Hgb urine dipstick: NEGATIVE
Ketones, ur: NEGATIVE mg/dL
Leukocytes,Ua: NEGATIVE
Nitrite: NEGATIVE
Protein, ur: 30 mg/dL — AB
Specific Gravity, Urine: 1.023 (ref 1.005–1.030)
pH: 5 (ref 5.0–8.0)

## 2023-06-26 LAB — LIPASE, BLOOD: Lipase: 58 U/L — ABNORMAL HIGH (ref 11–51)

## 2023-06-26 MED ORDER — SODIUM CHLORIDE 0.9% FLUSH
3.0000 mL | Freq: Two times a day (BID) | INTRAVENOUS | Status: DC
  Administered 2023-06-26 – 2023-06-28 (×4): 3 mL via INTRAVENOUS

## 2023-06-26 MED ORDER — INSULIN ASPART 100 UNIT/ML IJ SOLN
10.0000 [IU] | INTRAMUSCULAR | Status: AC
  Administered 2023-06-26: 10 [IU] via SUBCUTANEOUS
  Filled 2023-06-26: qty 0.1

## 2023-06-26 MED ORDER — INSULIN ASPART 100 UNIT/ML IJ SOLN
0.0000 [IU] | Freq: Every day | INTRAMUSCULAR | Status: DC
  Administered 2023-06-27: 3 [IU] via SUBCUTANEOUS
  Filled 2023-06-26: qty 0.05

## 2023-06-26 MED ORDER — AMLODIPINE BESYLATE 5 MG PO TABS
10.0000 mg | ORAL_TABLET | Freq: Every day | ORAL | Status: DC
  Administered 2023-06-27: 10 mg via ORAL
  Filled 2023-06-26: qty 2

## 2023-06-26 MED ORDER — POLYETHYLENE GLYCOL 3350 17 G PO PACK: 17.0000 g | PACK | Freq: Every day | ORAL | Status: DC | PRN

## 2023-06-26 MED ORDER — FAMOTIDINE 20 MG PO TABS
20.0000 mg | ORAL_TABLET | Freq: Two times a day (BID) | ORAL | Status: DC
  Administered 2023-06-26 – 2023-06-28 (×4): 20 mg via ORAL
  Filled 2023-06-26 (×4): qty 1

## 2023-06-26 MED ORDER — ACETAMINOPHEN 650 MG RE SUPP: 650.0000 mg | Freq: Four times a day (QID) | RECTAL | Status: DC | PRN

## 2023-06-26 MED ORDER — POTASSIUM CHLORIDE CRYS ER 20 MEQ PO TBCR
40.0000 meq | EXTENDED_RELEASE_TABLET | Freq: Once | ORAL | Status: AC
  Administered 2023-06-26: 40 meq via ORAL
  Filled 2023-06-26: qty 2

## 2023-06-26 MED ORDER — INSULIN ASPART 100 UNIT/ML IJ SOLN
0.0000 [IU] | Freq: Three times a day (TID) | INTRAMUSCULAR | Status: DC
  Administered 2023-06-27: 8 [IU] via SUBCUTANEOUS
  Administered 2023-06-27: 11 [IU] via SUBCUTANEOUS
  Administered 2023-06-27: 3 [IU] via SUBCUTANEOUS
  Administered 2023-06-28: 5 [IU] via SUBCUTANEOUS
  Administered 2023-06-28: 8 [IU] via SUBCUTANEOUS
  Filled 2023-06-26: qty 0.15

## 2023-06-26 MED ORDER — SODIUM CHLORIDE 0.9 % IV BOLUS
500.0000 mL | Freq: Once | INTRAVENOUS | Status: AC
  Administered 2023-06-26: 500 mL via INTRAVENOUS

## 2023-06-26 MED ORDER — THYROID 60 MG PO TABS
90.0000 mg | ORAL_TABLET | Freq: Every day | ORAL | Status: DC
  Administered 2023-06-27 – 2023-06-28 (×2): 90 mg via ORAL
  Filled 2023-06-26 (×2): qty 1

## 2023-06-26 MED ORDER — ACETAMINOPHEN 325 MG PO TABS: 650.0000 mg | ORAL_TABLET | Freq: Four times a day (QID) | ORAL | Status: DC | PRN

## 2023-06-26 MED ORDER — LOSARTAN POTASSIUM 50 MG PO TABS
100.0000 mg | ORAL_TABLET | Freq: Every day | ORAL | Status: DC
  Administered 2023-06-27 – 2023-06-28 (×2): 100 mg via ORAL
  Filled 2023-06-26: qty 2
  Filled 2023-06-26: qty 4

## 2023-06-26 NOTE — H&P (Signed)
 History and Physical    Patient: Adrienne [name] FMW:[id number] DOB: 06/05/1946 DOA: 06/26/2023 DOS: the patient was seen and examined on 06/26/2023 PCP: [name], MD  Patient coming from: Home>sent in by GI due to elevated LFTs  Chief Complaint:  Chief Complaint  Patient presents with   Abnormal Lab   HPI: Adrienne [name] is a 78 y.o. female with medical history significant of CHF, hyperlipidemia, hypertension .  Late last December patient presented to the ED emergency room for abdominal discomfort.  She was found to have elevated LFTs with a CT abdomen pelvis showing hypodense region in the head of pancreas with ductal dilation concerning for malignancy.  MRCP was concerning for pancreatic adenocarcinoma.  Patient underwent ERCP with stent placement in the pancreatic duct.  But the bile duct could not be cannulated.  And the cholangiogram failed.  Although the plan was initially to repeat the ERCP, patient's LFTs and bilirubin is improved spontaneously and the drain by IR was deferred.  And patient was discharged.  Patient apparently had a routine follow-up with her primary care provider on June 24, 2023.  LFTs were drawn.  Patient's ALP went up from 385 ten days prior to 1379.  Similarly her ALT went from 2 57-5 6 1.  Patient received a phone call today from PCP to come to the ER.  Although here in the ER patient's alkaline phosphatase is 8-1 and her ALT is 296.  These values are still higher than the numbers 10 days ago.  Patient at this time denies any fevers any abdominal pain has been tolerating diet no diarrhea no change in urine color.  Offers no complaints.  Patient has been evaluated by gastroenterology.  Plan is for ERCP tomorrow.  Deferred EUS. See note. Review of Systems: As mentioned in the history of present illness. All other systems reviewed and are negative. Past Medical History:  Diagnosis Date   Allergy    Ambulates with cane    straight cane    Arthritis    knee, back   CHF (congestive heart failure) (HCC)    Ductal carcinoma in situ of breast 12/2018   R Breast-mastectomy only   Gout    History of blood transfusion 1986   w/ Hysterectomy surgery   Hyperlipidemia    diet controlled - no meds   Hypertension    Hypothyroidism    Pelvic kidney    lower right pelvic kidney    SBO (small bowel obstruction) (HCC) 10/2016   surgery    Sleep apnea    Mild - no mask needed per sleep study   Smoker    quit smoking 2018   Type 2 diabetes mellitus (HCC)    Past Surgical History:  Procedure Laterality Date   ABDOMINAL HYSTERECTOMY  1986   COMPLETE-precancerous   APPENDECTOMY     pt states it was removed when gallbladder was removed.   AUGMENTATION MAMMAPLASTY     BACK SURGERY     lower back   BIOPSY  06/14/2023   Procedure: BIOPSY;  Surgeon: Avram Lupita BRAVO, MD;  Location: [facility];  Service: Gastroenterology;;   BREAST BIOPSY Right 05/12/2019   times 2   BREAST LUMPECTOMY WITH RADIOACTIVE SEED LOCALIZATION Right 03/18/2019   Procedure: RIGHT BREAST LUMPECTOMY WITH RADIOACTIVE SEED LOCALIZATION;  Surgeon: [name], MD;  Location: [facility];  Service: General;  Laterality: Right;   CHOLECYSTECTOMY     COLONOSCOPY  02/15/2008   Debrah    COLONOSCOPY  WITH PROPOFOL   02/27/2018   Dr.Nandigam   ECTOPIC PREGNANCY SURGERY     ERCP N/A 06/14/2023   Procedure: ENDOSCOPIC RETROGRADE CHOLANGIOPANCREATOGRAPHY (ERCP);  Surgeon: Avram Lupita BRAVO, MD;  Location: [facility];  Service: Gastroenterology;  Laterality: N/A;   ESOPHAGOGASTRODUODENOSCOPY (EGD) WITH PROPOFOL  N/A 06/14/2023   Procedure: ESOPHAGOGASTRODUODENOSCOPY (EGD) WITH PROPOFOL ;  Surgeon: Avram Lupita BRAVO, MD;  Location: [facility];  Service: Gastroenterology;  Laterality: N/A;   JOINT REPLACEMENT     Left hip total Dr. Melodi 10-01-17   MALONEY DILATION  06/14/2023   Procedure: AGAPITO DILATION;  Surgeon: Avram Lupita BRAVO, MD;  Location: [facility];  Service:  Gastroenterology;;   MASTECTOMY Right 07/26/2019   PANCREATIC STENT PLACEMENT  06/14/2023   Procedure: PANCREATIC STENT PLACEMENT;  Surgeon: Avram Lupita BRAVO, MD;  Location: [facility];  Service: Gastroenterology;;   POLYPECTOMY     polypectomy-oropharynx     RE-EXCISION OF BREAST CANCER,SUPERIOR MARGINS Right 04/08/2019   Procedure: RE-EXCISION OF RIGHT BREAST ANTERIOR MARGINS;  Surgeon: Curvin Mt III, MD;  Location: [facility];  Service: General;  Laterality: Right;   right knee meniscus     SBO  10/2016   small bowel obstruction   SIMPLE MASTECTOMY WITH AXILLARY SENTINEL NODE BIOPSY Right 07/26/2019   Procedure: RIGHT MASTECTOMY WITH SENTINEL NODE BIOPSY;  Surgeon: Curvin Mt MOULD, MD;  Location: [facility];  Service: General;  Laterality: Right;   TOTAL HIP ARTHROPLASTY Left 10/01/2017   Procedure: LEFT  TOTAL HIP ARTHROPLASTY ANTERIOR APPROACH;  Surgeon: Melodi Lerner, MD;  Location: [facility];  Service: Orthopedics;  Laterality: Left;   TOTAL HIP ARTHROPLASTY Right 03/11/2018   Procedure: RIGHT TOTAL HIP ARTHROPLASTY ANTERIOR APPROACH;  Surgeon: Melodi Lerner, MD;  Location: [facility];  Service: Orthopedics;  Laterality: Right;    UPPER GASTROINTESTINAL ENDOSCOPY     Social History:  reports that she quit smoking about 6 years ago. Her smoking use included e-cigarettes and cigarettes. She started smoking about 38 years ago. She has a 32 pack-year smoking history. She has never used smokeless tobacco. She reports that she does not drink alcohol and does not use drugs.  Allergies  Allergen Reactions   Lisinopril  Swelling    Angioedema   Statins     Myalgias   Atorvastatin  Other (See Comments)    Muscle aches     Family History  Problem Relation Age of Onset   Cancer Sister    Diabetes Brother    Diabetes Brother    Hypertension Other    Breast cancer Sister    Colon cancer Neg Hx    Colon polyps Neg Hx    Rectal cancer Neg Hx    Stomach cancer Neg Hx      Prior to Admission medications   Medication Sig Start Date End Date Taking? Authorizing Provider  amLODipine  (NORVASC ) 10 MG tablet Take 1 tablet by mouth once daily 04/25/23   Patel, Rutwik K, MD  Blood Glucose Monitoring Suppl DEVI 1 each by Does not apply route in the morning, at noon, and at bedtime. May substitute to any manufacturer covered by patient's insurance. 06/24/23   [name], MD  celecoxib  (CELEBREX ) 100 MG capsule Take 1 capsule (100 mg total) by mouth daily. 06/24/23   [name], MD  cetirizine  (ZYRTEC ) 10 MG tablet Take 1 tablet (10 mg total) by mouth daily. Patient taking differently: Take 10 mg by mouth daily as needed for allergies. 05/15/23   Soldatova, Liuba, MD  Cholecalciferol (  VITAMIN D -3) 125 MCG (5000 UT) TABS Take 2 tablets by mouth daily.    [provider]  famotidine  (PEPCID ) 20 MG tablet Take 1 tablet (20 mg total) by mouth 2 (two) times daily. 05/15/23   Soldatova, Liuba, MD  fluticasone  (FLONASE ) 50 MCG/ACT nasal spray Place 2 sprays into both nostrils daily. 05/15/23   Soldatova, Liuba, MD  Glucose Blood (BLOOD GLUCOSE TEST STRIPS) STRP 1 each by In Vitro route in the morning, at noon, and at bedtime. May substitute to any manufacturer covered by patient's insurance. 06/24/23 07/27/23  [name], MD  Lancet Device MISC 1 each by Does not apply route in the morning, at noon, and at bedtime. May substitute to any manufacturer covered by patient's insurance. 06/24/23 07/24/23  [name], MD  losartan  (COZAAR ) 100 MG tablet Take 1 tablet by mouth once daily 05/05/23   Patel, Rutwik K, MD  metFORMIN  (GLUCOPHAGE -XR) 500 MG 24 hr tablet Take 1 tablet (500 mg total) by mouth daily with breakfast. 02/14/23   [name], MD  ondansetron  (ZOFRAN ) 4 MG tablet Take 1 tablet (4 mg total) by mouth every 6 (six) hours as needed for nausea. 06/16/23   Cindy Garnette POUR, MD  sodium chloride  (OCEAN) 0.65 % SOLN nasal spray Place 1 spray into both  nostrils as needed. 05/15/23   Soldatova, Liuba, MD  thyroid  (NP THYROID ) 90 MG tablet Take 1 tablet (90 mg total) by mouth daily. 02/14/23   [name], MD    Physical Exam: Vitals:   06/26/23 1301 06/26/23 1650  BP: (!) 153/89 (!) 152/80  Pulse: (!) 55   Resp: 16   Temp: 97.9 F (36.6 C)   TempSrc: Oral   SpO2: 98%    Patient is alert and awake appears to be in no distress, tolerating p.o. diet.  Gives a coherent account of her complaints Respiratory exam: Bilateral intravesicular Cardiovascular exam S1-S2 normal Abdomen soft nontender Extremities warm without edema  Data Reviewed:  Labs on Admission:  Results for orders placed or performed during the hospital encounter of 06/26/23 (from the past 24 hours)  CBC with Differential     Status: Abnormal   Collection Time: 06/26/23  1:52 PM  Result Value Ref Range   WBC 11.4 (H) 4.0 - 10.5 K/uL   RBC 3.92 3.87 - 5.11 MIL/uL   Hemoglobin 12.3 12.0 - 15.0 g/dL   HCT 61.9 63.9 - 53.9 %   MCV 96.9 80.0 - 100.0 fL   MCH 31.4 26.0 - 34.0 pg   MCHC 32.4 30.0 - 36.0 g/dL   RDW 85.3 88.4 - 84.4 %   Platelets 255 150 - 400 K/uL   nRBC 0.0 0.0 - 0.2 %   Neutrophils Relative % 65 %   Neutro Abs 7.2 1.7 - 7.7 K/uL   Lymphocytes Relative 30 %   Lymphs Abs 3.4 0.7 - 4.0 K/uL   Monocytes Relative 5 %   Monocytes Absolute 0.6 0.1 - 1.0 K/uL   Eosinophils Relative 0 %   Eosinophils Absolute 0.1 0.0 - 0.5 K/uL   Basophils Relative 0 %   Basophils Absolute 0.1 0.0 - 0.1 K/uL   Immature Granulocytes 0 %   Abs Immature Granulocytes 0.03 0.00 - 0.07 K/uL  Comprehensive metabolic panel     Status: Abnormal   Collection Time: 06/26/23  1:52 PM  Result Value Ref Range   Sodium 135 135 - 145 mmol/L   Potassium 3.5 3.5 - 5.1 mmol/L  Chloride 103 98 - 111 mmol/L   CO2 23 22 - 32 mmol/L   Glucose, Bld 569 (HH) 70 - 99 mg/dL   BUN 23 8 - 23 mg/dL   Creatinine, Ser 8.77 (H) 0.44 - 1.00 mg/dL   Calcium  9.6 8.9 - 10.3 mg/dL   Total  Protein 8.0 6.5 - 8.1 g/dL   Albumin 3.8 3.5 - 5.0 g/dL   AST 72 (H) 15 - 41 U/L   ALT 296 (H) 0 - 44 U/L   Alkaline Phosphatase 821 (H) 38 - 126 U/L   Total Bilirubin 1.6 (H) 0.0 - 1.2 mg/dL   GFR, Estimated 46 (L) >60 mL/min   Anion gap 9 5 - 15  Lipase, blood     Status: Abnormal   Collection Time: 06/26/23  1:52 PM  Result Value Ref Range   Lipase 58 (H) 11 - 51 U/L   Basic Metabolic Panel: Recent Labs  Lab 06/24/23 0859 06/26/23 1352  NA 142 135  K 4.3 3.5  CL 102 103  CO2 21 23  GLUCOSE 374* 569*  BUN 19 23  CREATININE 1.31* 1.22*  CALCIUM  9.8 9.6   Liver Function Tests: Recent Labs  Lab 06/24/23 0859 06/26/23 1352  AST 266* 72*  ALT 561* 296*  ALKPHOS 1,379* 821*  BILITOT 1.8* 1.6*  PROT 7.4 8.0  ALBUMIN 4.2 3.8   Recent Labs  Lab 06/26/23 1352  LIPASE 58*   No results for input(s): AMMONIA in the last 168 hours. CBC: Recent Labs  Lab 06/24/23 0859 06/26/23 1352  WBC 8.3 11.4*  NEUTROABS 5.4 7.2  HGB 12.4 12.3  HCT 38.6 38.0  MCV 95 96.9  PLT 248 255   Cardiac Enzymes: No results for input(s): CKTOTAL, CKMB, CKMBINDEX, TROPONINIHS in the last 168 hours.  BNP (last 3 results) No results for input(s): PROBNP in the last 8760 hours. CBG: No results for input(s): GLUCAP in the last 168 hours.  Radiological Exams on Admission:  No results found.     Assessment and Plan: * Obstructive jaundice Clinicaly no cholangitis. I encouraged patient to report any new symptoms. At this time, minimal leukocytosis. No concenr for infection. See pancreatic mass below. Await ERCP in AM     GERD (gastroesophageal reflux disease) C/w pepcid  from home.  Leukocytosis Minimal .monitoirng clinicaly. Chcek CXR and UA  Hyperglycemia Patient glucoses in the 500 range.  No evidence of elevated anion gap or HHS.  This is new as patient has previously only been on metformin .  As recently as December 23 and glucoses have been less than 200.  I  suspect patient may be having some pancreatic insufficiency.  Although she is not reporting any diarrhea.  At this time patient will be started on insulin  therapy.  Giving marked elevation in LFTs, we will hold the metformin .  Trend glucose levels.  Patient did receive 500 cc normal saline bolus in the ER.  Change diet to diabetic  Helicobacter pylori gastritis Dx on EGD 12/21. Has not been started on abx therapy yet. GI prefers to defer therapy for H pylori treatment to discharge/after ERCP  Pancreatic mass Anticipate GI will perform EUS with biopsy (or brushing during ERCP tomorrow to help with diagnosis.  Per DC summary 06/16/2023:  -MRCP showed vague masslike fullness of central pancreatic head, severe pancreatic ductal dilatation distally, finding concerning for pancreatic adenocarcinoma GI consulted, underwent ERCP with stent placement in  pancreatic duct; unable to cannulate bile duct, cholangiogram failed -Initial plan for f/u  bilary drain and possible repeat ERCP -LFT's and bili later improved and pt no longer planned for drain by IR -Discussed with GI. OK to d/c today. Recs for repeat LFT's and KUB on Monday, possible re-attempt at ERCP  Ductal carcinoma in situ (DCIS) of right breast This is remote. It does not look like patient is on any aromatase inhibitor.  . Per report from Dr. Lanny in 08/11/2019 : Ductal carcinoma in situ (DCIS) of right breast, Stage 0, ER+/PR+, Intermediate Grade -She was diagnosed in 01/2019. To cure her DCIS she underwent right lumpectomy with Dr. Curvin on 03/18/19. Due to positive margins she underwent re-excision surgery on 04/08/19 and mastectomy on 07/26/2019.  -her surgical path showed a DCIS, final surgical margins were negative. -I discussed although her cancer is currently cured, she has high risk of developing breast cancer in her left breast.  -Given she underwent node negative right mastectomy, she does not require adjuvant radiation.  -Given her  ER/PR positive disease and high risk of future left breast cancer, I discussed adjuvant endocrine therapy with Aromatase inhibitor or Tamoxifen which can reduce her risk of future breast cancer by half.              Hypothyroidism C/w replacement with thryoid armour tablet 90 mg  Hypertension C/w losartan . amlodipine   DVT prophylaxis by SCD only as pending ERCP in AM  C/w pepcid  per home dose.   Advance Care Planning:   Code Status: Full Code   Consults: GI, note in chart, thankyou.  Family Communication: per patietn.  Severity of Illness: The appropriate patient status for this patient is INPATIENT. Inpatient status is judged to be reasonable and necessary in order to provide the required intensity of service to ensure the patient's safety. The patient's presenting symptoms, physical exam findings, and initial radiographic and laboratory data in the context of their chronic comorbidities is felt to place them at high risk for further clinical deterioration. Furthermore, it is not anticipated that the patient will be medically stable for discharge from the hospital within 2 midnights of admission.   * I certify that at the point of admission it is my clinical judgment that the patient will require inpatient hospital care spanning beyond 2 midnights from the point of admission due to high intensity of service, high risk for further deterioration and high frequency of surveillance required.*  Author: Jacqulyn Divine, MD 06/26/2023 5:38 PM  For on call review [email or web address].

## 2023-06-26 NOTE — ED Provider Notes (Signed)
 [facility] AT [facility] Provider Note   CSN: [id number] Arrival date & time: 06/26/23  1225     History  Chief Complaint  Patient presents with   Abnormal Lab    Adrienne [name] is a 78 y.o. female.  78 year old female with a history of recently diagnosed pancreatic mass and diabetes who presents emergency department for abnormal labs.  Patient was admitted and had ERCP with stent placement on 06/14/23 for her pancreatic mass with obstruction.  Stent was removed and she went home.  Reported feeling well but had outpatient labs that were drawn which showed elevated alk phos, bilirubin, and LFTs so she was referred into the emergency department.   Blood sugars been elevated and says she has not taken her metformin  in 3 weeks.  Not on any other diabetic medication. Denies any abdominal pain, shortness of breath, increased thirst or urination, fevers, nausea vomiting or diarrhea.        Home Medications Prior to Admission medications   Medication Sig Start Date End Date Taking? Authorizing Provider  amLODipine  (NORVASC ) 10 MG tablet Take 1 tablet by mouth once daily 04/25/23  Yes [name], MD  cetirizine  (ZYRTEC ) 10 MG tablet Take 1 tablet (10 mg total) by mouth daily. Patient taking differently: Take 10 mg by mouth daily as needed for allergies. 05/15/23  Yes Soldatova, Liuba, MD  Cholecalciferol (VITAMIN D -3) 125 MCG (5000 UT) TABS Take 1 tablet by mouth daily.   Yes [provider]  famotidine  (PEPCID ) 20 MG tablet Take 1 tablet (20 mg total) by mouth 2 (two) times daily. 05/15/23  Yes Soldatova, Liuba, MD  fluticasone  (FLONASE ) 50 MCG/ACT nasal spray Place 2 sprays into both nostrils daily. 05/15/23  Yes Soldatova, Liuba, MD  losartan  (COZAAR ) 100 MG tablet Take 1 tablet by mouth once daily 05/05/23  Yes Patel, Rutwik K, MD  metFORMIN  (GLUCOPHAGE -XR) 500 MG 24 hr tablet Take 1 tablet (500 mg total) by mouth daily with breakfast.  02/14/23  Yes [name], MD  thyroid  (NP THYROID ) 90 MG tablet Take 1 tablet (90 mg total) by mouth daily. 02/14/23  Yes [name], MD  Blood Glucose Monitoring Suppl DEVI 1 each by Does not apply route in the morning, at noon, and at bedtime. May substitute to any manufacturer covered by patient's insurance. 06/24/23   [name], MD  celecoxib  (CELEBREX ) 100 MG capsule Take 1 capsule (100 mg total) by mouth daily. Patient not taking: Reported on 06/26/2023 06/24/23   [name], MD  Glucose Blood (BLOOD GLUCOSE TEST STRIPS) STRP 1 each by In Vitro route in the morning, at noon, and at bedtime. May substitute to any manufacturer covered by patient's insurance. 06/24/23 07/27/23  [name], MD  Lancet Device MISC 1 each by Does not apply route in the morning, at noon, and at bedtime. May substitute to any manufacturer covered by patient's insurance. 06/24/23 07/24/23  [name], MD  sodium chloride  (OCEAN) 0.65 % SOLN nasal spray Place 1 spray into both nostrils as needed. Patient not taking: Reported on 06/26/2023 05/15/23   Soldatova, Liuba, MD      Allergies    Lisinopril , Statins, and Atorvastatin     Review of Systems   Review of Systems  Physical Exam Updated Vital Signs BP (!) 178/72   Pulse (!) 43   Temp 97.9 F (36.6 C) (Oral)   Resp 14   Ht 5' 3 (1.6 m)  Wt 76.1 kg   SpO2 100%   BMI 29.72 kg/m  Physical Exam Vitals and nursing note reviewed.  Constitutional:      General: She is not in acute distress.    Appearance: She is well-developed.  HENT:     Head: Normocephalic and atraumatic.     Right Ear: External ear normal.     Left Ear: External ear normal.     Nose: Nose normal.  Eyes:     Extraocular Movements: Extraocular movements intact.     Conjunctiva/sclera: Conjunctivae normal.     Pupils: Pupils are equal, round, and reactive to light.  Cardiovascular:     Rate and Rhythm: Normal rate and regular rhythm.  Pulmonary:     Effort:  Pulmonary effort is normal. No respiratory distress.  Abdominal:     General: Abdomen is flat. There is no distension.     Palpations: Abdomen is soft. There is no mass.     Tenderness: There is no abdominal tenderness. There is no guarding.  Musculoskeletal:     Cervical back: Normal range of motion and neck supple.  Skin:    General: Skin is warm and dry.  Neurological:     Mental Status: She is alert and oriented to person, place, and time. Mental status is at baseline.  Psychiatric:        Mood and Affect: Mood normal.     ED Results / Procedures / Treatments   Labs (all labs ordered are listed, but only abnormal results are displayed) Labs Reviewed  CBC WITH DIFFERENTIAL/PLATELET - Abnormal; Notable for the following components:      Result Value   WBC 11.4 (*)    All other components within normal limits  COMPREHENSIVE METABOLIC PANEL - Abnormal; Notable for the following components:   Glucose, Bld 569 (*)    Creatinine, Ser 1.22 (*)    AST 72 (*)    ALT 296 (*)    Alkaline Phosphatase 821 (*)    Total Bilirubin 1.6 (*)    GFR, Estimated 46 (*)    All other components within normal limits  LIPASE, BLOOD - Abnormal; Notable for the following components:   Lipase 58 (*)    All other components within normal limits  APTT - Abnormal; Notable for the following components:   aPTT 23 (*)    All other components within normal limits  BASIC METABOLIC PANEL - Abnormal; Notable for the following components:   Glucose, Bld 307 (*)    All other components within normal limits  CBC - Abnormal; Notable for the following components:   RBC 3.58 (*)    Hemoglobin 11.2 (*)    HCT 34.1 (*)    All other components within normal limits  URINALYSIS, COMPLETE (UACMP) WITH MICROSCOPIC - Abnormal; Notable for the following components:   Glucose, UA >=500 (*)    Protein, ur 30 (*)    All other components within normal limits  GLUCOSE, CAPILLARY - Abnormal; Notable for the following  components:   Glucose-Capillary 148 (*)    All other components within normal limits  GLUCOSE, CAPILLARY - Abnormal; Notable for the following components:   Glucose-Capillary 473 (*)    All other components within normal limits  GLUCOSE, CAPILLARY - Abnormal; Notable for the following components:   Glucose-Capillary 325 (*)    All other components within normal limits  HEPATIC FUNCTION PANEL - Abnormal; Notable for the following components:   Albumin 3.3 (*)    AST 441 (*)  ALT 344 (*)    Alkaline Phosphatase 804 (*)    Total Bilirubin 2.5 (*)    Bilirubin, Direct 1.3 (*)    Indirect Bilirubin 1.2 (*)    All other components within normal limits  GLUCOSE, CAPILLARY - Abnormal; Notable for the following components:   Glucose-Capillary 158 (*)    All other components within normal limits  SURGICAL PCR SCREEN  PROTIME-INR  TSH  T4, FREE  T3    EKG None  Radiology DG Chest 2 View Result Date: 06/26/2023 CLINICAL DATA:  [id number] Fever [id number] EXAM: CHEST - 2 VIEW COMPARISON:  Chest x-ray 12/28/2009 FINDINGS: Enlarged cardiac silhouette. Otherwise the heart and mediastinal contours are within normal limits. No focal consolidation. No pulmonary edema. No pleural effusion. No pneumothorax. No acute osseous abnormality.  Right chest wall surgical clips. IMPRESSION: Mild cardiomegaly with no active cardiopulmonary disease. Electronically Signed   By: Morgane  Naveau M.D.   On: 06/26/2023 22:48    Procedures Procedures    Medications Ordered in ED Medications  insulin  aspart (novoLOG ) injection 0-15 Units ( Subcutaneous Automatically Held 07/12/23 1700)  insulin  aspart (novoLOG ) injection 0-5 Units ( Subcutaneous Automatically Held 07/12/23 2200)  0.9 %  sodium chloride  infusion ( Intravenous Continued from Pre-op 06/27/23 1258)  polyethylene glycol (MIRALAX  / GLYCOLAX ) packet 17 g ( Oral MAR Hold 06/27/23 1214)  sodium chloride  flush (NS) 0.9 % injection 3 mL ( Intravenous Automatically  Held 07/12/23 2200)  losartan  (COZAAR ) tablet 100 mg ( Oral Automatically Held 07/05/23 1000)  thyroid  (ARMOUR) tablet 90 mg ( Oral Automatically Held 07/05/23 0600)  famotidine  (PEPCID ) tablet 20 mg ( Oral Automatically Held 07/12/23 2200)  ciprofloxacin  (CIPRO ) IVPB 400 mg (400 mg Intravenous Given 06/27/23 1318)  sodium chloride  0.9 % bolus 500 mL (500 mLs Intravenous New Bag/Given 06/26/23 1842)  potassium chloride  SA (KLOR-CON  M) CR tablet 40 mEq (40 mEq Oral Given 06/26/23 1638)  insulin  aspart (novoLOG ) injection 10 Units (10 Units Subcutaneous Given 06/26/23 1634)  insulin  starter kit- pen needles (English) 1 kit (1 kit Other Given 06/27/23 1137)  living well with diabetes book MISC ( Does not apply Given 06/27/23 1138)    ED Course/ Medical Decision Making/ A&P Clinical Course as of 06/27/23 1337  Thu Jun 26, 2023  1606 McMicheal PA from GI consulted and recommends admission for ERCP in the morning. [RP]  1632 Dr Moody to admit the patient.  [RP]    Clinical Course User Index [RP] Yolande Lamar BROCKS, MD                                 Medical Decision Making Risk Prescription drug management. Decision regarding hospitalization.   Chrystle Murillo is a 78 y.o. female with comorbidities that complicate the patient evaluation including recently diagnosed pancreatic mass and non-insulin -dependent diabetes who presents emergency department for abnormal labs.    Initial Ddx:  Obstructive biliary pathology, pancreatic cancer, hyperglycemia, DKA, AKI  MDM/Course:  Patient presents to the emergency department with elevated LFTs.  On exam she is overall well-appearing without any abdominal tenderness to palpation.  Has been worked up for her pancreatic mass that is concerning for cancer.  Had a stent that was placed and removed recently.  Her LFTs today seem to have improved from prior.  She was evaluated by GI who wants to do an ERCP tomorrow.  She was hyperglycemic without signs of DKA and  was given  fluids and insulin .  Upon re-evaluation remained stable.  Admitted to hospitalist for further management.  This patient presents to the ED for concern of complaints listed in HPI, this involves an extensive number of treatment options, and is a complaint that carries with it a high risk of complications and morbidity. Disposition including potential need for admission considered.   Dispo: Admit to Floor  Records reviewed DC Summary The following labs were independently interpreted: Chemistry and show AKI and Hyperglycemia I have reviewed the patients home medications and made adjustments as needed Consults: Gastroenterology Social Determinants of health:  Elderly  Portions of this note were generated with Scientist, clinical (histocompatibility and immunogenetics). Dictation errors may occur despite best attempts at proofreading.     Final Clinical Impression(s) / ED Diagnoses Final diagnoses:  Pancreatic mass  Elevated LFTs  Hyperglycemia    Rx / DC Orders ED Discharge Orders     None         Yolande Lamar BROCKS, MD 06/27/23 [phone]

## 2023-06-26 NOTE — Assessment & Plan Note (Signed)
 C/w pepcid from home.

## 2023-06-26 NOTE — Assessment & Plan Note (Addendum)
 This is remote. It does not look like patient is on any aromatase inhibitor.  . Per report from Dr. Lanny in 08/11/2019 : Ductal carcinoma in situ (DCIS) of right breast, Stage 0, ER+/PR+, Intermediate Grade -She was diagnosed in 01/2019. To cure her DCIS she underwent right lumpectomy with Dr. Curvin on 03/18/19. Due to positive margins she underwent re-excision surgery on 04/08/19 and mastectomy on 07/26/2019.  -her surgical path showed a DCIS, final surgical margins were negative. -I discussed although her cancer is currently cured, she has high risk of developing breast cancer in her left breast.  -Given she underwent node negative right mastectomy, she does not require adjuvant radiation.  -Given her ER/PR positive disease and high risk of future left breast cancer, I discussed adjuvant endocrine therapy with Aromatase inhibitor or Tamoxifen which can reduce her risk of future breast cancer by half.

## 2023-06-26 NOTE — Assessment & Plan Note (Signed)
 Dx on EGD 12/21. Has not been started on abx therapy yet. GI prefers to defer therapy for H pylori treatment to discharge/after ERCP

## 2023-06-26 NOTE — Assessment & Plan Note (Addendum)
 Patient glucoses in the 500 range.  No evidence of elevated anion gap or HHS.  This is new as patient has previously only been on metformin .  As recently as December 23 and glucoses have been less than 200.  I suspect patient may be having some pancreatic insufficiency.  Although she is not reporting any diarrhea.  At this time patient will be started on insulin  therapy.  Giving marked elevation in LFTs, we will hold the metformin .  Trend glucose levels.  Patient did receive 500 cc normal saline bolus in the ER.  Change diet to diabetic

## 2023-06-26 NOTE — Assessment & Plan Note (Signed)
 Minimal .monitoirng clinicaly. Chcek CXR and UA

## 2023-06-26 NOTE — Telephone Encounter (Signed)
 This encounter was created in error - please disregard.

## 2023-06-26 NOTE — Telephone Encounter (Signed)
 Patient called and stated that she was running late and that she showed up to the [facility] thinking her appointment was there. I advise patient that she has 15 minute after her appointment time to arrive. Patient verbalize understanding.

## 2023-06-26 NOTE — Consult Note (Addendum)
 Consultation  Referring Provider:  [facility]  Primary Care Physician:  [name], MD Primary Gastroenterologist:  Dr. Shila       Reason for Consultation:     Jaundice, pancreatic mass  LOS: 0 days          HPI:   Adrienne [name] is a 78 y.o. female with past medical history significant for DCIS, hypertension, hypothyroidism, nonischemic cardiomyopathy, and type 2 diabetes mellitus presents for evaluation of elevated alk phos.  Patient recently admitted 06/13/2023 to 06/16/2023 with obstructive jaundice, mass in the head of the pancreas, pancreatic and biliary dilation.  S/p EGD/ERCP 12/21 with Dr. Avram.  Unable to cannulate bile duct at ERCP.  Pancreatic duct stent placed with improvement in bilirubin.  Also therapeutic dilation of proximal esophageal stenosis with 40 French Maloney (complained of dysphagia) and H pylori gastritis.  Patient was discharged 12/23 with improvement in total bilirubin to 1.8.  KUB showed passing of pancreatic stent.  Due to decrease in bilirubin IR did not proceed with PBD. Planned to see GI outpatient today (1/2) to set up further procedures.  Labs drawn by PCP 06/24/2023 showed stable total bilirubin of 1.8.  Exponential increase in alk phos to 1379.  AST and ALT also increasing with AST 266, ALT 561. No leukocytosis.  Patient was instructed to proceed to emergency department for further evaluation so patient was unable to make outpatient GI appt.[name]  Upon arrival to ED labs have improved with AST 72/ALT 296/alk phos 821.  Total bilirubin 1.6.  Some leukocytosis with WBC 11.4.  Lipase 58.  Patient has no complaints.  States she feels good.  Denies abdominal pain, nausea, vomiting, change in bowel habits, melena/hematochezia.   PREVIOUS GI WORKUP   EGD/ERCP 06/14/23 - The examination was suspicious for a pancreatic tumor in the head of the pancreas. Pancreatic duct cannulated and partially filled - very dilated - underfilled by intent -  given multiple wire passages and contrast injection a 4 Fr 3 cm no flap singel pigtail stent was placed. - Attempts at a cholangiogram failed. - Esophageal stenosis. Dilated 40 fr Maloney w/ mild mucosal disruption - she had been c/o mild dysphagia and had seen ENT - Mucosal changes suspicious for gastritis. Biopsied. H pylori positive.   MRCP/MR 06/14/23 IMPRESSION: 1. Vague, masslike fullness of the central pancreatic head measuring 2.5 x 2.0 cm. Minimal underlying diffusion restriction and hypoenhancement. Severe pancreatic ductal dilatation distally, measuring up to 1.3 cm in caliber, with mild pancreatic parenchymal atrophy. Findings are highly concerning for pancreatic adenocarcinoma. 2. Severe intra and extrahepatic biliary ductal dilatation the common bile duct effaced within the superior pancreatic head and the extrahepatic duct measuring up to 1.7 cm in caliber. 3. The portal confluence and central superior mesenteric vein appear to be encased and partially effaced in the vicinity of suspected pancreatic head mass. 4. Status post cholecystectomy. 5. No evidence of lymphadenopathy or metastatic disease in the abdomen. 6. Cardiomegaly. Past Medical History:  Diagnosis Date   Allergy    Ambulates with cane    straight cane   Arthritis    knee, back   CHF (congestive heart failure) (HCC)    Ductal carcinoma in situ of breast 12/2018   R Breast-mastectomy only   Gout    History of blood transfusion 1986   w/ Hysterectomy surgery   Hyperlipidemia    diet controlled - no meds   Hypertension    Hypothyroidism    Pelvic  kidney    lower right pelvic kidney    SBO (small bowel obstruction) (HCC) 10/2016   surgery    Sleep apnea    Mild - no mask needed per sleep study   Smoker    quit smoking 2018   Type 2 diabetes mellitus (HCC)     Surgical History:  She  has a past surgical history that includes Back surgery; Cholecystectomy; right knee meniscus;  polypectomy-oropharynx; Ectopic pregnancy surgery; Joint replacement; Total hip arthroplasty (Left, 10/01/2017); SBO (10/2016); Upper gastrointestinal endoscopy; Colonoscopy (02/15/2008); Total hip arthroplasty (Right, 03/11/2018); Appendectomy; Breast lumpectomy with radioactive seed localization (Right, 03/18/2019); Re-excision of breast cancer,superior margins (Right, 04/08/2019); Simple mastectomy with axillary sentinel node biopsy (Right, 07/26/2019); Abdominal hysterectomy (1986); Colonoscopy with propofol  (02/27/2018); Polypectomy; Mastectomy (Right, 07/26/2019); Breast biopsy (Right, 05/12/2019); Augmentation mammaplasty; ERCP (N/A, 06/14/2023); Esophagogastroduodenoscopy (egd) with propofol  (N/A, 06/14/2023); maloney dilation (06/14/2023); pancreatic stent placement (06/14/2023); and biopsy (06/14/2023). Family History:  Her family history includes Breast cancer in her sister; Cancer in her sister; Diabetes in her brother and brother; Hypertension in an other family member. Social History:   reports that she quit smoking about 6 years ago. Her smoking use included e-cigarettes and cigarettes. She started smoking about 38 years ago. She has a 32 pack-year smoking history. She has never used smokeless tobacco. She reports that she does not drink alcohol and does not use drugs.  Prior to Admission medications   Medication Sig Start Date End Date Taking? Authorizing Provider  amLODipine  (NORVASC ) 10 MG tablet Take 1 tablet by mouth once daily 04/25/23   Patel, Rutwik K, MD  Blood Glucose Monitoring Suppl DEVI 1 each by Does not apply route in the morning, at noon, and at bedtime. May substitute to any manufacturer covered by patient's insurance. 06/24/23   [name], MD  celecoxib  (CELEBREX ) 100 MG capsule Take 1 capsule (100 mg total) by mouth daily. 06/24/23   [name], MD  cetirizine  (ZYRTEC ) 10 MG tablet Take 1 tablet (10 mg total) by mouth daily. Patient taking differently: Take 10  mg by mouth daily as needed for allergies. 05/15/23   Soldatova, Liuba, MD  Cholecalciferol (VITAMIN D -3) 125 MCG (5000 UT) TABS Take 2 tablets by mouth daily.    [provider]  famotidine  (PEPCID ) 20 MG tablet Take 1 tablet (20 mg total) by mouth 2 (two) times daily. 05/15/23   Soldatova, Liuba, MD  fluticasone  (FLONASE ) 50 MCG/ACT nasal spray Place 2 sprays into both nostrils daily. 05/15/23   Soldatova, Liuba, MD  Glucose Blood (BLOOD GLUCOSE TEST STRIPS) STRP 1 each by In Vitro route in the morning, at noon, and at bedtime. May substitute to any manufacturer covered by patient's insurance. 06/24/23 07/27/23  [name], MD  Lancet Device MISC 1 each by Does not apply route in the morning, at noon, and at bedtime. May substitute to any manufacturer covered by patient's insurance. 06/24/23 07/24/23  [name], MD  losartan  (COZAAR ) 100 MG tablet Take 1 tablet by mouth once daily 05/05/23   Patel, Rutwik K, MD  metFORMIN  (GLUCOPHAGE -XR) 500 MG 24 hr tablet Take 1 tablet (500 mg total) by mouth daily with breakfast. 02/14/23   [name], MD  ondansetron  (ZOFRAN ) 4 MG tablet Take 1 tablet (4 mg total) by mouth every 6 (six) hours as needed for nausea. 06/16/23   Cindy Garnette POUR, MD  sodium chloride  (OCEAN) 0.65 % SOLN nasal spray Place 1 spray into both nostrils as needed.  05/15/23   Soldatova, Liuba, MD  thyroid  (NP THYROID ) 90 MG tablet Take 1 tablet (90 mg total) by mouth daily. 02/14/23   [name], MD    Current Facility-Administered Medications  Medication Dose Route Frequency Provider Last Rate Last Admin   insulin  aspart (novoLOG ) injection 0-15 Units  0-15 Units Subcutaneous TID WC Paterson, Robert C, MD       insulin  aspart (novoLOG ) injection 0-5 Units  0-5 Units Subcutaneous QHS Paterson, Robert C, MD       insulin  aspart (novoLOG ) injection 10 Units  10 Units Subcutaneous STAT Yolande Lamar BROCKS, MD       potassium chloride  SA (KLOR-CON  M) CR tablet 40 mEq   40 mEq Oral Once Yolande Lamar BROCKS, MD       sodium chloride  0.9 % bolus 500 mL  500 mL Intravenous Once Yolande Lamar BROCKS, MD       Current Outpatient Medications  Medication Sig Dispense Refill   amLODipine  (NORVASC ) 10 MG tablet Take 1 tablet by mouth once daily 90 tablet 0   Blood Glucose Monitoring Suppl DEVI 1 each by Does not apply route in the morning, at noon, and at bedtime. May substitute to any manufacturer covered by patient's insurance. 1 each 0   celecoxib  (CELEBREX ) 100 MG capsule Take 1 capsule (100 mg total) by mouth daily. 30 capsule 3   cetirizine  (ZYRTEC ) 10 MG tablet Take 1 tablet (10 mg total) by mouth daily. (Patient taking differently: Take 10 mg by mouth daily as needed for allergies.) 30 tablet 11   Cholecalciferol (VITAMIN D -3) 125 MCG (5000 UT) TABS Take 2 tablets by mouth daily.     famotidine  (PEPCID ) 20 MG tablet Take 1 tablet (20 mg total) by mouth 2 (two) times daily. 30 tablet 1   fluticasone  (FLONASE ) 50 MCG/ACT nasal spray Place 2 sprays into both nostrils daily. 16 g 6   Glucose Blood (BLOOD GLUCOSE TEST STRIPS) STRP 1 each by In Vitro route in the morning, at noon, and at bedtime. May substitute to any manufacturer covered by patient's insurance. 100 strip 0   Lancet Device MISC 1 each by Does not apply route in the morning, at noon, and at bedtime. May substitute to any manufacturer covered by patient's insurance. 1 each 0   losartan  (COZAAR ) 100 MG tablet Take 1 tablet by mouth once daily 90 tablet 0   metFORMIN  (GLUCOPHAGE -XR) 500 MG 24 hr tablet Take 1 tablet (500 mg total) by mouth daily with breakfast. 30 tablet 5   ondansetron  (ZOFRAN ) 4 MG tablet Take 1 tablet (4 mg total) by mouth every 6 (six) hours as needed for nausea. 20 tablet 0   sodium chloride  (OCEAN) 0.65 % SOLN nasal spray Place 1 spray into both nostrils as needed. 30 mL 5   thyroid  (NP THYROID ) 90 MG tablet Take 1 tablet (90 mg total) by mouth daily. 30 tablet 5    Allergies as of  06/26/2023 - Review Complete 06/26/2023  Allergen Reaction Noted   Lisinopril  Swelling 03/22/2016   Statins  10/10/2022   Atorvastatin  Other (See Comments) 09/12/2016    Review of Systems  Constitutional:  Negative for chills, fever and weight loss.  HENT:  Negative for hearing loss and tinnitus.   Eyes:  Negative for blurred vision and double vision.  Respiratory:  Negative for cough and hemoptysis.   Cardiovascular:  Negative for chest pain and palpitations.  Gastrointestinal:  Negative for abdominal pain, blood in stool, constipation, diarrhea, heartburn,  melena, nausea and vomiting.  Genitourinary:  Negative for dysuria and urgency.  Musculoskeletal:  Negative for myalgias and neck pain.  Skin:  Negative for itching and rash.  Neurological:  Negative for seizures and loss of consciousness.  Psychiatric/Behavioral:  Negative for depression and suicidal ideas.        Physical Exam:  Vital signs in last 24 hours: Temp:  [97.9 F (36.6 C)] 97.9 F (36.6 C) (01/02 1301) Pulse Rate:  [55] 55 (01/02 1301) Resp:  [16] 16 (01/02 1301) BP: (153)/(89) 153/89 (01/02 1301) SpO2:  [98 %] 98 % (01/02 1301)   Last BM recorded by nurses in past 5 days No data recorded  Physical Exam Constitutional:      Appearance: Normal appearance.  HENT:     Head: Normocephalic and atraumatic.     Nose: Nose normal. No congestion.     Mouth/Throat:     Mouth: Mucous membranes are moist.     Pharynx: Oropharynx is clear.  Eyes:     General: No scleral icterus.    Extraocular Movements: Extraocular movements intact.  Cardiovascular:     Rate and Rhythm: Normal rate and regular rhythm.  Pulmonary:     Effort: Pulmonary effort is normal. No respiratory distress.  Abdominal:     General: Abdomen is flat. Bowel sounds are normal. There is no distension.     Palpations: Abdomen is soft. There is no mass.     Tenderness: There is no abdominal tenderness. There is no guarding or rebound.      Hernia: No hernia is present.  Musculoskeletal:        General: No swelling. Normal range of motion.  Skin:    General: Skin is warm and dry.     Coloration: Skin is not jaundiced or pale.  Neurological:     General: No focal deficit present.     Mental Status: She is alert and oriented to person, place, and time.  Psychiatric:        Mood and Affect: Mood normal.        Thought Content: Thought content normal.        Judgment: Judgment normal.      LAB RESULTS: Recent Labs    06/24/23 0859 06/26/23 1352  WBC 8.3 11.4*  HGB 12.4 12.3  HCT 38.6 38.0  PLT 248 255   BMET Recent Labs    06/24/23 0859 06/26/23 1352  NA 142 135  K 4.3 3.5  CL 102 103  CO2 21 23  GLUCOSE 374* 569*  BUN 19 23  CREATININE 1.31* 1.22*  CALCIUM  9.8 9.6   LFT Recent Labs    06/26/23 1352  PROT 8.0  ALBUMIN 3.8  AST 72*  ALT 296*  ALKPHOS 821*  BILITOT 1.6*   PT/INR No results for input(s): LABPROT, INR in the last 72 hours.  STUDIES: No results found.    Impression    Obstructive jaundice with mass in head of pancreas.  Pancreatic and biliary dilation.  S/p EGD/ERCP 12/21 with Dr. Avram.  Unable to cannulate bile duct at ERCP.  Pancreatic duct stent placed with improvement in bilirubin. KUB with passing of stent. Admitted with worsening LFTs now improving again. AST 72/ALT 296/alk phos 821, improving from alk phos of 1379 T. bili 1.6 CA 19-9 <2  H. Pylori gastritis Dx on EGD 12/21. Has not been started on abx therapy yet. -- will need H pylori treatment upon discharge  Dysphagia therapeutic dilation of proximal esophageal stenosis  with 40 [place] Stai 12/21 - improved   Plan   - Daily CBC, CMET -Continue supportive care -Plan for ERCP tomorrow -I thoroughly discussed procedure with the patient to include nature, alternatives, benefits, and risks (including but not limited to post ERCP pancreatitis, bleeding, infection, perforation, anesthesia/cardiac  pulmonary complications). Discussed risk of pancreatitis associated with ERCP. Patient verbalized understanding and gave verbal consent to proceed with ERCP. - If ERCP is unsuccessful we will consider IR consultation for drain - EUS in the near future.  Inpatient versus outpatient depending on availability. -- NPO midnight. -- H .pylori treatment to be started upon eventual discharge  Thank you for your kind consultation, we will continue to follow.   Bayley CHRISTELLA Blower  06/26/2023, 4:11 PM     Attending physician's note   I have taken history, reviewed the chart and examined the patient. I performed a substantive portion of this encounter, including complete performance of at least one of the key components, in conjunction with the APP. I agree with the Advanced Practitioner's note, impression and recommendations.   Suspected pancreatic mass with double duct sign. Nl CA 19-9 New onset diabetes Dysphagia due to proximal esophageal stricture s/p dilatation 40 Fr 12/21-much better HP gastritis   Plan: -Reasonable to attempt another ERCP. Explained risks and benefits including risks of pancreatitis, bleeding, perforation.  Benefits were also discussed. -Likely EUS in future. -HP treatment at discharge  Anselm Bring, MD [facility] [phone]

## 2023-06-26 NOTE — ED Triage Notes (Signed)
 Pt arrives via POV. Pt states her PCP told her to come to the ED for elevated ast/alt and hgb A1c. Pt denies any complaints. PT AxOx4.

## 2023-06-26 NOTE — Assessment & Plan Note (Addendum)
 Clinicaly no cholangitis. I encouraged patient to report any new symptoms. At this time, minimal leukocytosis. No concenr for infection. See pancreatic mass below. Await ERCP in AM

## 2023-06-26 NOTE — Assessment & Plan Note (Addendum)
 Anticipate GI will perform EUS with biopsy (or brushing during ERCP tomorrow to help with diagnosis.  Per DC summary 06/16/2023:  -MRCP showed vague masslike fullness of central pancreatic head, severe pancreatic ductal dilatation distally, finding concerning for pancreatic adenocarcinoma GI consulted, underwent ERCP with stent placement in  pancreatic duct; unable to cannulate bile duct, cholangiogram failed -Initial plan for f/u bilary drain and possible repeat ERCP -LFT's and bili later improved and pt no longer planned for drain by IR -Discussed with GI. OK to d/c today. Recs for repeat LFT's and KUB on Monday, possible re-attempt at ERCP

## 2023-06-26 NOTE — ED Provider Triage Note (Signed)
 Emergency Medicine Provider Triage Evaluation Note  Adrienne [name] , a 78 y.o. female  was evaluated in triage.  Pt complains of abnormal labs.  Had labs drawn at her PCP office which showed transaminitis.  Was sent here for further evaluation.  Patient has known pancreatic mass which is suspicious for pancreatic adenocarcinoma.  Review of Systems  Positive:  Negative: See above   Physical Exam  BP (!) 153/89 (BP Location: Left Arm)   Pulse (!) 55   Temp 97.9 F (36.6 C) (Oral)   Resp 16   SpO2 98%  Gen:   Awake, no distress   Resp:  Normal effort  MSK:   Moves extremities without difficulty  Other:    Medical Decision Making  Medically screening exam initiated at 1:36 PM.  Appropriate orders placed.  Zakya Halabi [facility] was informed that the remainder of the evaluation will be completed by another provider, this initial triage assessment does not replace that evaluation, and the importance of remaining in the ED until their evaluation is complete.     Theotis Peers [place], [place] 06/26/23 1339

## 2023-06-26 NOTE — Assessment & Plan Note (Signed)
 C/w losartan. amlodipine

## 2023-06-26 NOTE — Assessment & Plan Note (Signed)
 C/w replacement with thryoid armour tablet 90 mg

## 2023-06-27 ENCOUNTER — Inpatient Hospital Stay (HOSPITAL_COMMUNITY): Payer: PPO | Admitting: Certified Registered Nurse Anesthetist

## 2023-06-27 ENCOUNTER — Inpatient Hospital Stay (HOSPITAL_COMMUNITY): Payer: PPO

## 2023-06-27 ENCOUNTER — Other Ambulatory Visit: Payer: Self-pay | Admitting: *Deleted

## 2023-06-27 ENCOUNTER — Encounter (HOSPITAL_COMMUNITY): Payer: Self-pay | Admitting: Internal Medicine

## 2023-06-27 ENCOUNTER — Telehealth: Payer: Self-pay | Admitting: Internal Medicine

## 2023-06-27 ENCOUNTER — Encounter (HOSPITAL_COMMUNITY)
Admission: EM | Disposition: A | Discharge status: Home / Self Care | Payer: Self-pay | Source: Home / Self Care | Attending: Internal Medicine

## 2023-06-27 DIAGNOSIS — C250 Malignant neoplasm of head of pancreas: Secondary | ICD-10-CM

## 2023-06-27 DIAGNOSIS — R17 Unspecified jaundice: Secondary | ICD-10-CM | POA: Diagnosis not present

## 2023-06-27 DIAGNOSIS — R748 Abnormal levels of other serum enzymes: Secondary | ICD-10-CM

## 2023-06-27 DIAGNOSIS — K8689 Other specified diseases of pancreas: Secondary | ICD-10-CM | POA: Diagnosis not present

## 2023-06-27 DIAGNOSIS — I10 Essential (primary) hypertension: Secondary | ICD-10-CM | POA: Diagnosis not present

## 2023-06-27 DIAGNOSIS — K838 Other specified diseases of biliary tract: Secondary | ICD-10-CM | POA: Diagnosis not present

## 2023-06-27 DIAGNOSIS — Z87891 Personal history of nicotine dependence: Secondary | ICD-10-CM

## 2023-06-27 DIAGNOSIS — K831 Obstruction of bile duct: Secondary | ICD-10-CM | POA: Diagnosis not present

## 2023-06-27 HISTORY — PX: SPHINCTEROTOMY: SHX5279

## 2023-06-27 HISTORY — PX: ERCP: SHX5425

## 2023-06-27 HISTORY — PX: PANCREATIC STENT PLACEMENT: SHX5539

## 2023-06-27 LAB — GLUCOSE, CAPILLARY
Glucose-Capillary: 473 mg/dL — ABNORMAL HIGH (ref 70–99)
Glucose-Capillary: 325 mg/dL — ABNORMAL HIGH (ref 70–99)
Glucose-Capillary: 158 mg/dL — ABNORMAL HIGH (ref 70–99)
Glucose-Capillary: 264 mg/dL — ABNORMAL HIGH (ref 70–99)
Glucose-Capillary: 286 mg/dL — ABNORMAL HIGH (ref 70–99)

## 2023-06-27 LAB — CBC
HCT: 34.1 % — ABNORMAL LOW (ref 36.0–46.0)
Hemoglobin: 11.2 g/dL — ABNORMAL LOW (ref 12.0–15.0)
MCH: 31.3 pg (ref 26.0–34.0)
MCHC: 32.8 g/dL (ref 30.0–36.0)
MCV: 95.3 fL (ref 80.0–100.0)
Platelets: 222 10*3/uL (ref 150–400)
RBC: 3.58 MIL/uL — ABNORMAL LOW (ref 3.87–5.11)
RDW: 14.2 % (ref 11.5–15.5)
WBC: 9.8 10*3/uL (ref 4.0–10.5)
nRBC: 0 % (ref 0.0–0.2)

## 2023-06-27 LAB — BASIC METABOLIC PANEL
Anion gap: 9 (ref 5–15)
BUN: 18 mg/dL (ref 8–23)
CO2: 22 mmol/L (ref 22–32)
Calcium: 9.2 mg/dL (ref 8.9–10.3)
Chloride: 108 mmol/L (ref 98–111)
Creatinine, Ser: 0.87 mg/dL (ref 0.44–1.00)
GFR, Estimated: >60 mL/min (ref >60)
Glucose, Bld: 307 mg/dL — ABNORMAL HIGH (ref 70–99)
Potassium: 4 mmol/L (ref 3.5–5.1)
Sodium: 139 mmol/L (ref 135–145)

## 2023-06-27 LAB — HEPATIC FUNCTION PANEL
ALT: 344 U/L — ABNORMAL HIGH (ref 0–44)
AST: 441 U/L — ABNORMAL HIGH (ref 15–41)
Albumin: 3.3 g/dL — ABNORMAL LOW (ref 3.5–5.0)
Alkaline Phosphatase: 804 U/L — ABNORMAL HIGH (ref 38–126)
Bilirubin, Direct: 1.3 mg/dL — ABNORMAL HIGH (ref 0.0–0.2)
Indirect Bilirubin: 1.2 mg/dL — ABNORMAL HIGH (ref 0.3–0.9)
Total Bilirubin: 2.5 mg/dL — ABNORMAL HIGH (ref 0.0–1.2)
Total Protein: 7 g/dL (ref 6.5–8.1)

## 2023-06-27 LAB — TSH: TSH: 3.783 u[IU]/mL (ref 0.350–4.500)

## 2023-06-27 LAB — MICROALBUMIN / CREATININE URINE RATIO
Creatinine, Urine: 104.2 mg/dL
Microalb/Creat Ratio: 36 mg/g{creat} — ABNORMAL HIGH (ref 0–29)
Microalbumin, Urine: 37.9 ug/mL

## 2023-06-27 LAB — APTT: aPTT: 23 s — ABNORMAL LOW (ref 24–36)

## 2023-06-27 LAB — SURGICAL PCR SCREEN
MRSA, PCR: NEGATIVE
Staphylococcus aureus: NEGATIVE

## 2023-06-27 LAB — PROTIME-INR
INR: 1 (ref 0.8–1.2)
Prothrombin Time: 13.3 s (ref 11.4–15.2)

## 2023-06-27 LAB — T4, FREE: Free T4: 0.67 ng/dL (ref 0.61–1.12)

## 2023-06-27 SURGERY — ERCP, WITH INTERVENTION IF INDICATED
Anesthesia: General

## 2023-06-27 MED ORDER — EPHEDRINE SULFATE-NACL 50-0.9 MG/10ML-% IV SOSY
PREFILLED_SYRINGE | INTRAVENOUS | Status: DC | PRN
  Administered 2023-06-27 (×2): 5 mg via INTRAVENOUS
  Administered 2023-06-27: 10 mg via INTRAVENOUS

## 2023-06-27 MED ORDER — PROPOFOL 10 MG/ML IV BOLUS
INTRAVENOUS | Status: DC | PRN
  Administered 2023-06-27: 130 mg via INTRAVENOUS

## 2023-06-27 MED ORDER — DICLOFENAC SUPPOSITORY 100 MG
RECTAL | Status: DC | PRN
  Administered 2023-06-27: 100 mg via RECTAL

## 2023-06-27 MED ORDER — INSULIN STARTER KIT- PEN NEEDLES (ENGLISH)
1.0000 | Freq: Once | Status: AC
  Administered 2023-06-27: 1
  Filled 2023-06-27: qty 1

## 2023-06-27 MED ORDER — INDOMETHACIN 50 MG RE SUPP
RECTAL | Status: AC
  Filled 2023-06-27: qty 2

## 2023-06-27 MED ORDER — LIVING WELL WITH DIABETES BOOK
Freq: Once | Status: AC
  Filled 2023-06-27: qty 1

## 2023-06-27 MED ORDER — SODIUM CHLORIDE 0.9 % IV SOLN
INTRAVENOUS | Status: DC | PRN
  Administered 2023-06-27: 8 mL

## 2023-06-27 MED ORDER — DEXAMETHASONE SODIUM PHOSPHATE 4 MG/ML IJ SOLN
INTRAMUSCULAR | Status: DC | PRN
  Administered 2023-06-27: 5 mg via INTRAVENOUS

## 2023-06-27 MED ORDER — CIPROFLOXACIN IN D5W 400 MG/200ML IV SOLN
INTRAVENOUS | Status: AC
  Filled 2023-06-27: qty 200

## 2023-06-27 MED ORDER — HYDRALAZINE HCL 20 MG/ML IJ SOLN: 10.0000 mg | Freq: Four times a day (QID) | INTRAMUSCULAR | Status: DC | PRN

## 2023-06-27 MED ORDER — ONDANSETRON HCL 4 MG/2ML IJ SOLN
INTRAMUSCULAR | Status: DC | PRN
  Administered 2023-06-27: 4 mg via INTRAVENOUS

## 2023-06-27 MED ORDER — ROCURONIUM BROMIDE 10 MG/ML (PF) SYRINGE
PREFILLED_SYRINGE | INTRAVENOUS | Status: DC | PRN
  Administered 2023-06-27: 50 mg via INTRAVENOUS

## 2023-06-27 MED ORDER — FENTANYL CITRATE (PF) 100 MCG/2ML IJ SOLN
INTRAMUSCULAR | Status: AC
  Filled 2023-06-27: qty 2

## 2023-06-27 MED ORDER — CIPROFLOXACIN IN D5W 400 MG/200ML IV SOLN
400.0000 mg | Freq: Two times a day (BID) | INTRAVENOUS | Status: DC
  Administered 2023-06-28 (×2): 400 mg via INTRAVENOUS
  Filled 2023-06-27 (×2): qty 200

## 2023-06-27 MED ORDER — SODIUM CHLORIDE 0.9 % IV SOLN: INTRAVENOUS | Status: DC

## 2023-06-27 MED ORDER — GLUCAGON HCL RDNA (DIAGNOSTIC) 1 MG IJ SOLR
INTRAMUSCULAR | Status: DC | PRN
  Administered 2023-06-27 (×4): .25 mg via INTRAVENOUS

## 2023-06-27 MED ORDER — LIDOCAINE 2% (20 MG/ML) 5 ML SYRINGE
INTRAMUSCULAR | Status: DC | PRN
  Administered 2023-06-27: 60 mg via INTRAVENOUS

## 2023-06-27 MED ORDER — DICLOFENAC SUPPOSITORY 100 MG
RECTAL | Status: AC
  Filled 2023-06-27: qty 1

## 2023-06-27 MED ORDER — FENTANYL CITRATE (PF) 100 MCG/2ML IJ SOLN
INTRAMUSCULAR | Status: DC | PRN
  Administered 2023-06-27 (×2): 50 ug via INTRAVENOUS

## 2023-06-27 MED ORDER — CIPROFLOXACIN IN D5W 400 MG/200ML IV SOLN
400.0000 mg | INTRAVENOUS | Status: AC
  Administered 2023-06-27: 400 mg via INTRAVENOUS
  Filled 2023-06-27: qty 200

## 2023-06-27 MED ORDER — GLUCAGON HCL RDNA (DIAGNOSTIC) 1 MG IJ SOLR
INTRAMUSCULAR | Status: AC
  Filled 2023-06-27: qty 2

## 2023-06-27 MED ORDER — CIPROFLOXACIN IN D5W 400 MG/200ML IV SOLN
400.0000 mg | Freq: Once | INTRAVENOUS | Status: DC
  Filled 2023-06-27: qty 200

## 2023-06-27 NOTE — Anesthesia Postprocedure Evaluation (Signed)
 Anesthesia Post Note  Patient: Tessah Patchen [place]  Procedure(s) Performed: ENDOSCOPIC RETROGRADE CHOLANGIOPANCREATOGRAPHY (ERCP) SPHINCTEROTOMY PANCREATIC STENT PLACEMENT     Patient location during evaluation: PACU Anesthesia Type: General Level of consciousness: awake and alert Pain management: pain level controlled Vital Signs Assessment: post-procedure vital signs reviewed and stable Respiratory status: spontaneous breathing, nonlabored ventilation, respiratory function stable and patient connected to nasal cannula oxygen Cardiovascular status: blood pressure returned to baseline and stable Postop Assessment: no apparent nausea or vomiting Anesthetic complications: no   No notable events documented.  Last Vitals:  Vitals:   06/27/23 1510 06/27/23 1520  BP: (!) 148/76 (!) 148/65  Pulse: (!) 54 (!) 51  Resp: 18 17  Temp:    SpO2: 94% 95%    Last Pain:  Vitals:   06/27/23 1520  TempSrc:   PainSc: 0-No pain                 Tully Burgo

## 2023-06-27 NOTE — Progress Notes (Signed)
 POST ERCP NOTE:  Pt doing well. No abdominal pain. No nausea/vomiting or melena. Vitals stable.  Abdominal Exam: soft, nontender.   Plan: -Advance diet to low fat diet in AM -Continue cipro  x 3 days total. -If OK, can D/C home in AM. -She has FU with Elida Berber at our office on Jan 10th (labs on Jan 9) -Will see if Dr Wilhelmenia has any availability for EUS on Thus.  -Dr Rollin covering for the weekend. -Discussed with pt.  Anselm Bring

## 2023-06-27 NOTE — Anesthesia Procedure Notes (Signed)
 Procedure Name: Intubation Date/Time: 06/27/2023 1:17 PM  Performed by: Judythe Tanda Aran, CRNAPre-anesthesia Checklist: Patient identified, Emergency Drugs available, Suction available and Patient being monitored Patient Re-evaluated:Patient Re-evaluated prior to induction Oxygen Delivery Method: Circle system utilized Preoxygenation: Pre-oxygenation with 100% oxygen Induction Type: IV induction Ventilation: Mask ventilation without difficulty Laryngoscope Size: 2 and Miller Grade View: Grade I Tube type: Oral Tube size: 7.0 mm Number of attempts: 1 Airway Equipment and Method: Stylet and Oral airway Placement Confirmation: ETT inserted through vocal cords under direct vision, positive ETCO2 and breath sounds checked- equal and bilateral Secured at: 21 cm Tube secured with: Tape Dental Injury: Teeth and Oropharynx as per pre-operative assessment

## 2023-06-27 NOTE — Interval H&P Note (Signed)
 History and Physical Interval Note:  06/27/2023 1:00 PM  Adrienne [name]  has presented today for surgery, with the diagnosis of Obstructive jaundice with pancreatic mass, pancreatic and biliary dilation, elevated LFTs.  The various methods of treatment have been discussed with the patient and family. After consideration of risks, benefits and other options for treatment, the patient has consented to  Procedure(s): ENDOSCOPIC RETROGRADE CHOLANGIOPANCREATOGRAPHY (ERCP) (N/A) as a surgical intervention.  The patient's history has been reviewed, patient examined, no change in status, stable for surgery.  I have reviewed the patient's chart and labs.  Questions were answered to the patient's satisfaction.     Lynnie Bring

## 2023-06-27 NOTE — Progress Notes (Signed)
 MEWS Progress Note  Patient Details Name: Adrienne [name] MRN: [id number] DOB: 02/19/1946 Today's Date: 06/27/2023   MEWS Flowsheet Documentation:  Assess: MEWS Score Temp: 98.2 F (36.8 C) BP: (!) 146/80 MAP (mmHg): 101 Pulse Rate: (!) 39 Resp: 16 Level of Consciousness: Alert SpO2: 99 % O2 Device: Room Air Assess: MEWS Score MEWS Temp: 0 MEWS Systolic: 0 MEWS Pulse: 2 MEWS RR: 0 MEWS LOC: 0 MEWS Score: 2 MEWS Score Color: Yellow Assess: SIRS CRITERIA SIRS Temperature : 0 SIRS Respirations : 0 SIRS Pulse: 0 SIRS WBC: 0 SIRS Score Sum : 0 SIRS Temperature : 0 SIRS Pulse: 0 SIRS Respirations : 0 SIRS WBC: 0 SIRS Score Sum : 0 Assess: if the MEWS score is Yellow or Red Were vital signs accurate and taken at a resting state?: Yes Does the patient meet 2 or more of the SIRS criteria?: No MEWS guidelines implemented : Yes, yellow Treat MEWS Interventions: Considered administering scheduled or prn medications/treatments as ordered Take Vital Signs Increase Vital Sign Frequency : Yellow: Q2hr x1, continue Q4hrs until patient remains green for 12hrs Escalate MEWS: Escalate: Yellow: Discuss with charge nurse and consider notifying provider and/or RRT Notify: Charge Nurse/RN Name of Charge Nurse/RN Notified: Danna,RN Provider Notification Provider Name/Title: Davia Nydia POUR, MD Date Provider Notified: 06/27/23 Time Provider Notified: [id number] Method of Notification: Page Notification Reason: Other (Comment) Provider response: Other (Comment), See new orders Date of Provider Response: 06/27/23 Time of Provider Response: 1016  EKG conducted per MD request      Tyrica Afzal E Hideo Googe 06/27/2023, 11:08 AM

## 2023-06-27 NOTE — Progress Notes (Signed)
 [facility]                                                                              [name], is a 78 y.o. female, DOB - 05/13/1946, FMW:[id number] Admit date - 06/26/2023    Outpatient Primary MD for the patient is Adrienne Suzzane POUR, MD  LOS - 1  days  Chief Complaint  Patient presents with   Abnormal Lab       Brief summary   Patient is a 78 year old female with CHF, HTN, HLP, was admitted 06/13/2023-06/16/2023, was found to have pancreatic mass with obstructive jaundice, underwent ERCP with stent placement but bile duct could not be cannulated and failed cholangiogram.  Plan was to repeat ERCP however LFTs and bilirubin improved and the drain by IR was deferred. Patient apparently had a routine follow-up with PCP on 06/14/2023 and noted to have transaminitis again.  Patient was recommended to come to the ER. Patient denied any fevers, worsening abdominal pain, had been tolerating diet.   Assessment & Plan    Principal Problem: Obstructive jaundice with known pancreatic mass, pancreatic and biliary dilation -Clinically stable, no cholangitis -GI consulted, plan for ERCP today -N.p.o.   Sinus bradycardia -Noted history of sinus bradycardia, HR in 30s to 40s this morning, asymptomatic -EKG showed sinus bradycardia, discussed with cardiology, no intervention needed. -Amlodipine  held   GERD (gastroesophageal reflux disease) Continue Pepcid    Leukocytosis Improving, possibly stress demargination -UA negative -Chest x-ray showed mild cardiomegaly, no infiltrates   Diabetes mellitus type 2, likely from pancreatic insufficiency -Hemoglobin A1c 8.1 on 06/24/2023 -Continue sliding scale insulin  while inpatient CBG (last 3)  Recent Labs    06/26/23 2039 06/27/23 0740 06/27/23 1142  GLUCAP 148* 325* 158*      Helicobacter pylori gastritis Dx on EGD 12/21. Has not been started on abx therapy yet. GI prefers to defer therapy for H pylori  treatment to discharge/after ERCP     Ductal carcinoma in situ (DCIS) of right breast This is remote. It does not look like patient is on any aromatase inhibitor.  . Per report from Dr. Lanny in 08/11/2019 : Ductal carcinoma in situ (DCIS) of right breast, Stage 0, ER+/PR+, Intermediate Grade -Status post right lumpectomy in 01/2019, reexcision surgery on 04/08/2019 and mastectomy on 07/26/2019, final surgical margins were negative -Outpatient follow-up with oncology   Hypothyroidism Continue thyroid  [place]   Hypertension Continue losartan , - amlodipine  held, will add hydralazine  as needed with parameters   Estimated body mass index is 29.72 kg/m as calculated from the following:   Height as of this encounter: 5' 3 (1.6 m).   Weight as of this encounter: 76.1 kg.  Code Status: Full code DVT Prophylaxis:  SCDs Start: 06/26/23 1705   Level of Care: Level of care: Med-Surg Family Communication: Updated patient Disposition Plan:      Remains inpatient appropriate:      Procedures:    Consultants:   GI  Antimicrobials:   Anti-infectives (From admission, onward)    Start     Dose/Rate Route Frequency Ordered Stop   06/27/23 1300  [MAR Hold]  ciprofloxacin  (CIPRO ) IVPB 400  mg        (MAR Hold since Fri 06/27/2023 at 1214.Hold Reason: Transfer to a Procedural area)   400 mg 200 mL/hr over 60 Minutes Intravenous 60 min pre-op 06/27/23 0041 06/27/23 1338   06/27/23 0115  ciprofloxacin  (CIPRO ) IVPB 400 mg  Status:  Discontinued        400 mg 200 mL/hr over 60 Minutes Intravenous  Once 06/27/23 0027 06/27/23 0041          Medications  [MAR Hold] famotidine   20 mg Oral BID   [MAR Hold] insulin  aspart  0-15 Units Subcutaneous TID WC   [MAR Hold] insulin  aspart  0-5 Units Subcutaneous QHS   [MAR Hold] losartan   100 mg Oral Daily   [MAR Hold] sodium chloride  flush  3 mL Intravenous Q12H   [MAR Hold] thyroid   90 mg Oral Daily      Subjective:   Adrienne [name] was seen and  examined today.  HR noted to be low this morning, asymptomatic, no dizziness, lightheadedness, nausea or vomiting.  EKG with sinus bradycardia. No acute events overnight.    Objective:   Vitals:   06/27/23 0127 06/27/23 0546 06/27/23 1019 06/27/23 1220  BP: (!) 157/71 (!) 133/94 (!) 146/80 (!) 178/72  Pulse: (!) 46 (!) 54 (!) 39 (!) 43  Resp: 16 15 16 14   Temp: 98.2 F (36.8 C) 98.8 F (37.1 C) 98.2 F (36.8 C) 97.9 F (36.6 C)  TempSrc: Oral Oral Oral Oral  SpO2: 100% 96% 99% 100%  Weight:      Height:        Intake/Output Summary (Last 24 hours) at 06/27/2023 1345 Last data filed at 06/27/2023 1325 Gross per 24 hour  Intake 249.67 ml  Output 300 ml  Net -50.33 ml     Wt Readings from Last 3 Encounters:  06/26/23 76.1 kg  06/24/23 76.7 kg  06/13/23 79.4 kg     Exam General: Alert and oriented x 3, NAD Cardiovascular: S1 S2 auscultated,  RRR, bradycardia Respiratory: Clear to auscultation bilaterally, no wheezing, rales or rhonchi Gastrointestinal: Soft, nontender, nondistended, + bowel sounds Ext: no pedal edema bilaterally Neuro: no new deficits Psych: Normal affect     Data Reviewed:  I have personally reviewed following labs    CBC Lab Results  Component Value Date   WBC 9.8 06/27/2023   RBC 3.58 (L) 06/27/2023   HGB 11.2 (L) 06/27/2023   HCT 34.1 (L) 06/27/2023   MCV 95.3 06/27/2023   MCH 31.3 06/27/2023   PLT 222 06/27/2023   MCHC 32.8 06/27/2023   RDW 14.2 06/27/2023   LYMPHSABS 3.4 06/26/2023   MONOABS 0.6 06/26/2023   EOSABS 0.1 06/26/2023   BASOSABS 0.1 06/26/2023     Last metabolic panel Lab Results  Component Value Date   NA 139 06/27/2023   K 4.0 06/27/2023   CL 108 06/27/2023   CO2 22 06/27/2023   BUN 18 06/27/2023   CREATININE 0.87 06/27/2023   GLUCOSE 307 (H) 06/27/2023   GFRNONAA >60 06/27/2023   GFRAA 78 08/29/2020   CALCIUM  9.2 06/27/2023   PHOS 3.1 11/17/2016   PROT 7.0 06/27/2023   ALBUMIN 3.3 (L) 06/27/2023    LABGLOB 3.2 06/24/2023   AGRATIO 1.4 10/10/2022   BILITOT 2.5 (H) 06/27/2023   ALKPHOS 804 (H) 06/27/2023   AST 441 (H) 06/27/2023   ALT 344 (H) 06/27/2023   ANIONGAP 9 06/27/2023    CBG (last 3)  Recent Labs    06/26/23 2039  06/27/23 0740 06/27/23 1142  GLUCAP 148* 325* 158*      Coagulation Profile: Recent Labs  Lab 06/27/23 0346  INR 1.0     Radiology Studies: I have personally reviewed the imaging studies  DG Chest 2 View Result Date: 06/26/2023 CLINICAL DATA:  [id number] Fever [id number] EXAM: CHEST - 2 VIEW COMPARISON:  Chest x-ray 12/28/2009 FINDINGS: Enlarged cardiac silhouette. Otherwise the heart and mediastinal contours are within normal limits. No focal consolidation. No pulmonary edema. No pleural effusion. No pneumothorax. No acute osseous abnormality.  Right chest wall surgical clips. IMPRESSION: Mild cardiomegaly with no active cardiopulmonary disease. Electronically Signed   By: Morgane  Naveau M.D.   On: 06/26/2023 22:48       Anton Cheramie M.D. [facility] 06/27/2023, 1:45 PM  Available via Epic secure chat 7am-7pm After 7 pm, please refer to night coverage provider listed on amion.

## 2023-06-27 NOTE — Op Note (Signed)
 [facility] Patient Name: Adrienne [name] Procedure Date: 06/27/2023 MRN: [id number] Attending MD: Lynnie Bring , MD, [id number] Date of Birth: 12/26/1945 CSN: [id number] Age: 78 Admit Type: Inpatient Procedure:                ERCP Indications:              Jaundice, Elevated liver enzymes. MRCP with double                            duct sign. Providers:                Lynnie Bring, MD, Ozell Pouch, Darleene Bare,                            RN, Rolin Doss, Technician, Renay Darner Referring MD:              Medicines:                General Anesthesia, Cipro  400 mg IV, Indomethacin                             100 mg PR Complications:            No immediate complications. Estimated Blood Loss:     Estimated blood loss: none. Procedure:                Pre-Anesthesia Assessment:                           - Prior to the procedure, a History and Physical                            was performed, and patient medications and                            allergies were reviewed. The patient's tolerance of                            previous anesthesia was also reviewed. The risks                            and benefits of the procedure and the sedation                            options and risks were discussed with the patient.                            All questions were answered, and informed consent                            was obtained. Prior Anticoagulants: The patient has                            taken no anticoagulant or antiplatelet agents. ASA  Grade Assessment: III - A patient with severe                            systemic disease. After reviewing the risks and                            benefits, the patient was deemed in satisfactory                            condition to undergo the procedure.                           After obtaining informed consent, the scope was                            passed under direct vision.  Throughout the                            procedure, the patient's blood pressure, pulse, and                            oxygen saturations were monitored continuously. The                            [id number]-[id number] ([id number] Olympus duodenoscope was                            introduced through the mouth, and used to inject                            contrast into and used to inject contrast into the                            bile duct. The ERCP was unusually difficult due to                            challenging cannulation because of abnormal                            anatomy. The patient tolerated the procedure well. Scope In: Scope Out: Findings:      The scout film was normal. The esophagus was successfully intubated       under direct vision. The scope was advanced to a normal major papilla in       the descending duodenum without detailed examination of the pharynx,       larynx and associated structures, and upper GI tract. The upper GI tract       was grossly normal.      Multiple folds were noted suggestive of shar pei papilla. The ventral       pancreatic duct was deeply cannulated and limited contrast injected. The       pancreatic duct was significantly dilated with irregular distal       stricture. One 4 Fr by 3 cm plastic stent with a single internal pigtail  was placed into the ventral pancreatic duct. The stent was in good       position. Thereafter, despite of multiple attempts, CBD could not be       cannulated. A limited needle-knife sphincterotomy was performed over the       pancreatic stent at 11 o'clock position. Slight amount of bile was seen.       Still, despite few more attempts, wire did not find CBD. The duodenal       mucosa was very redundant. At one point in time, there was a submucosal       extension of wire.      In patient's best interest, we decided to hold off on any further       attempts at cannulation. We did cannulate pancreatic duct before        aborting the procedure in an attempt to place a stent. Unfortunately the       stent got lodged at the elevator and again got dislodged.      Indocin  suppositories were given prior to the procedure. Aggressive IV       hydration was utilized. Patient was given additional bolus. Impression:               - Failed cholangiogram (this was second attempt at                            ERCP)                           - Pancreatic mass on MRCP with double duct sign. Moderate Sedation:      Not Applicable - Patient had care per Anesthesia. Recommendation:           - Return patient to hospital ward for ongoing care.                           - Watch for pancreatitis, bleeding, perforation,                            and cholangitis.                           - Would keep her on clear liquid diet. Do not                            advance until tomorrow.                           - Would continue Cipro  400 mg every 12 hours for 3                            days.                           - IR consultation for possible PTC as an outpatient.                           - She will also require EUS with biopsy likely.  Will get in touch with Dr. Wilhelmenia.                           - The findings and recommendations were discussed                            with the patient's family. Procedure Code(s):        --- Professional ---                           [phone], Endoscopic retrograde                            cholangiopancreatography (ERCP); with placement of                            endoscopic stent into biliary or pancreatic duct,                            including pre- and post-dilation and guide wire                            passage, when performed, including sphincterotomy,                            when performed, each stent                           [phone], Endoscopic catheterization of the pancreatic                            ductal system, radiological  supervision and                            interpretation Diagnosis Code(s):        --- Professional ---                           R17, Unspecified jaundice                           R74.8, Abnormal levels of other serum enzymes CPT copyright 2022 American Medical Association. All rights reserved. The codes documented in this report are preliminary and upon coder review may  be revised to meet current compliance requirements. Lynnie Bring, MD 06/27/2023 2:48:01 PM This report has been signed electronically. Number of Addenda: 0

## 2023-06-27 NOTE — Inpatient Diabetes Management (Signed)
 Inpatient Diabetes Program Recommendations  AACE/ADA: New Consensus Statement on Inpatient Glycemic Control  Target Ranges:  Prepandial:   less than 140 mg/dL      Peak postprandial:   less than 180 mg/dL (1-2 hours)      Critically ill patients:  140 - 180 mg/dL    Latest Reference Range & Units 06/26/23 16:56 06/26/23 20:39 06/27/23 07:40  Glucose-Capillary 70 - 99 mg/dL 526 (H) 851 (H) 674 (H)    Latest Reference Range & Units 06/26/23 13:52 06/27/23 03:46  CO2 22 - 32 mmol/L 23 22  Glucose 70 - 99 mg/dL 430 (HH) 692 (H)  Anion gap 5 - 15  9 9    Review of Glycemic Control  Diabetes history: DM2 Outpatient Diabetes medications: Metformin  XR 500 mg QAM Current orders for Inpatient glycemic control: Novolog  0-15 units TID with meals, Novolog  0-5 units QHS  Inpatient Diabetes Program Recommendations:    Insulin : Please consider ordering Semglee  10 units Q24H. If patient will remain NPO, please consider changing CBGs to Q4H and Novolog  0-15 units to Q4H.  Outpatient DM: Given pancreatic mass and likely pancreatic insufficiency, patient will likely need to be discharged on insulin  regimen for DM control.  NOTE: Patient admitted with obstructive jaundice, hyperglycemia (lab glucose 569 mg/dl on 01/28/73), and noted to have pancreatic mass. Per chart, patient is only taking Metformin  for DM as an outpatient. Tried to call patient's room phone but no answer. Per chart, patient to have ERCP today. Will ask on campus inpatient diabetes coordinator to speak with patient today regarding DM and insulin .  Thanks, Earnie Gainer, RN, MSN, CDCES Diabetes Coordinator Inpatient Diabetes Program [phone] (Team Pager from 8am to 5pm)

## 2023-06-27 NOTE — Inpatient Diabetes Management (Signed)
 Inpatient Diabetes Program Recommendations  AACE/ADA: New Consensus Statement on Inpatient Glycemic Control (2015)  Target Ranges:  Prepandial:   less than 140 mg/dL      Peak postprandial:   less than 180 mg/dL (1-2 hours)      Critically ill patients:  140 - 180 mg/dL   Lab Results  Component Value Date   GLUCAP 158 (H) 06/27/2023   HGBA1C 8.1 (H) 06/24/2023    Review of Glycemic Control  Diabetes history: DM2 Outpatient Diabetes medications: metformin  XR 500 QAM Current orders for Inpatient glycemic control: Novolog  0-15 TID with meals and 0-5 HS  Will likely need to be discharged on insulin .  Inpatient Diabetes Program Recommendations:    Educated patient on insulin  pen use at home. Reviewed contents of insulin  flexpen starter kit. Reviewed all steps if insulin  pen including attachment of needle, 2-unit air shot, dialing up dose, giving injection, removing needle, disposal of sharps, storage of unused insulin , disposal of insulin  etc. Patient able to provide successful return demonstration. Also reviewed troubleshooting with insulin  pen. MD to give patient Rxs for insulin  pens and insulin  pen needles.  Pt states I will do this if I have to. Was surprised to see how easy it is to use insulin  pen. Gave [facility] and insulin  pen starter kit.  Consider Semglee  10 Q24H  Continue to follow.  Thank you. Shona Brandy, RD, LDN, CDCES Inpatient Diabetes Coordinator [phone]

## 2023-06-27 NOTE — Transfer of Care (Signed)
 Immediate Anesthesia Transfer of Care Note  Patient: Adrienne [name]  Procedure(s) Performed: ENDOSCOPIC RETROGRADE CHOLANGIOPANCREATOGRAPHY (ERCP) SPHINCTEROTOMY PANCREATIC STENT PLACEMENT  Patient Location: Endoscopy Unit  Anesthesia Type:General  Level of Consciousness: awake and patient cooperative  Airway & Oxygen Therapy: Patient Spontanous Breathing and Patient connected to face mask  Post-op Assessment: Report given to RN and Post -op Vital signs reviewed and stable  Post vital signs: Reviewed and stable  Last Vitals:  Vitals Value Taken Time  BP 140/63 06/27/23 1436  Temp    Pulse 51 06/27/23 1438  Resp 18 06/27/23 1438  SpO2 98 % 06/27/23 1438  Vitals shown include unfiled device data.  Last Pain:  Vitals:   06/27/23 1220  TempSrc: Oral  PainSc: 0-No pain         Complications: No notable events documented.

## 2023-06-27 NOTE — Progress Notes (Signed)
 Scheduled for ERCP today. Draw Hepatic panel to trend LFTs. Improvement in leukocytosis. NPO for procedure.

## 2023-06-27 NOTE — Progress Notes (Signed)
   06/27/23 0906  TOC Brief Assessment  Insurance and Status Reviewed  Patient has primary care physician Yes  Home environment has been reviewed Resides with children  Prior level of function: Independent at baseline  Prior/Current Home Services No current home services  Social Drivers of Health Review SDOH reviewed no interventions necessary  Readmission risk has been reviewed Yes  Transition of care needs no transition of care needs at this time

## 2023-06-27 NOTE — Progress Notes (Signed)
 Patient has been NPO since midnight for a scheduled morning procedure. Consent form has been signed, and MRSA screening has been completed. The patient has verbally confirmed understanding of the procedure and has no questions at this time

## 2023-06-27 NOTE — Telephone Encounter (Signed)
 Copied from CRM [id number]. Topic: General - Other >> Jun 26, 2023  3:50 PM Fonda Kinder J wrote: Reason for CRM: Pt called in to make her PCP aware that she has checked into [facility]

## 2023-06-27 NOTE — Plan of Care (Signed)
  Problem: Education: Goal: Ability to describe self-care measures that may prevent or decrease complications (Diabetes Survival Skills Education) will improve Outcome: Progressing Goal: Individualized Educational Video(s) Outcome: Progressing   Problem: Coping: Goal: Ability to adjust to condition or change in health will improve Outcome: Progressing   Problem: Fluid Volume: Goal: Ability to maintain a balanced intake and output will improve Outcome: Progressing   Problem: Health Behavior/Discharge Planning: Goal: Ability to identify and utilize available resources and services will improve Outcome: Progressing Goal: Ability to manage health-related needs will improve Outcome: Progressing   Problem: Metabolic: Goal: Ability to maintain appropriate glucose levels will improve Outcome: Progressing   Problem: Nutritional: Goal: Maintenance of adequate nutrition will improve Outcome: Progressing Goal: Progress toward achieving an optimal weight will improve Outcome: Progressing   Problem: Skin Integrity: Goal: Risk for impaired skin integrity will decrease Outcome: Progressing   Problem: Tissue Perfusion: Goal: Adequacy of tissue perfusion will improve Outcome: Progressing   Problem: Health Behavior/Discharge Planning: Goal: Ability to manage health-related needs will improve Outcome: Progressing   Problem: Clinical Measurements: Goal: Ability to maintain clinical measurements within normal limits will improve Outcome: Progressing Goal: Will remain free from infection Outcome: Progressing Goal: Diagnostic test results will improve Outcome: Progressing Goal: Respiratory complications will improve Outcome: Progressing Goal: Cardiovascular complication will be avoided Outcome: Progressing   Problem: Activity: Goal: Risk for activity intolerance will decrease Outcome: Progressing   Problem: Nutrition: Goal: Adequate nutrition will be maintained Outcome:  Progressing   Problem: Elimination: Goal: Will not experience complications related to bowel motility Outcome: Progressing Goal: Will not experience complications related to urinary retention Outcome: Progressing   Problem: Pain Management: Goal: General experience of comfort will improve Outcome: Progressing   Problem: Safety: Goal: Ability to remain free from injury will improve Outcome: Progressing   Problem: Skin Integrity: Goal: Risk for impaired skin integrity will decrease Outcome: Progressing

## 2023-06-27 NOTE — Anesthesia Preprocedure Evaluation (Addendum)
 Anesthesia Evaluation  Patient identified by MRN, date of birth, ID band Patient awake    Reviewed: Allergy & Precautions, H&P , NPO status , Patient's Chart, lab work & pertinent test results  Airway Mallampati: III  TM Distance: >3 FB Neck ROM: Full    Dental  (+) Teeth Intact, Dental Advisory Given   Pulmonary sleep apnea (mild OSA, no CPAP) , COPD (no inhalers), former smoker Quit smoking 2018, 32 pack year history    Pulmonary exam normal breath sounds clear to auscultation       Cardiovascular hypertension (178/72 preop), Pt. on medications + Peripheral Vascular Disease and +CHF (grade 2 diastolic dysfunction, 45-50% LVEF)  Normal cardiovascular exam+ Valvular Problems/Murmurs (mild MR) MR  Rhythm:Regular Rate:Normal  Echo 08/2022  1. Left ventricular ejection fraction, by estimation, is 45 to 50%. The  left ventricle has mildly decreased function. The left ventricle  demonstrates global hypokinesis. Left ventricular diastolic parameters are  consistent with Grade II diastolic dysfunction (pseudonormalization).   2. Right ventricular systolic function is normal. The right ventricular  size is normal.   3. Left atrial size was moderately dilated.   4. The mitral valve is normal in structure. Mild mitral valve  regurgitation. No evidence of mitral stenosis.   5. The aortic valve is tricuspid. Aortic valve regurgitation is not  visualized. Aortic valve sclerosis is present, with no evidence of aortic  valve stenosis.   6. Aortic dilatation noted. There is borderline dilatation of the  ascending aorta, measuring 38 mm.   7. The inferior vena cava is normal in size with greater than 50%  respiratory variability, suggesting right atrial pressure of 3 mmHg.     Neuro/Psych negative neurological ROS  negative psych ROS   GI/Hepatic ,GERD  Controlled,,Obstructive jaundice   Endo/Other  diabetes, Well Controlled, Type 2, Oral  Hypoglycemic AgentsHypothyroidism    Renal/GU negative Renal ROS  negative genitourinary   Musculoskeletal  (+) Arthritis , Osteoarthritis,    Abdominal   Peds negative pediatric ROS (+)  Hematology negative hematology ROS (+) Hb 11.2   Anesthesia Other Findings   Reproductive/Obstetrics negative OB ROS                             Anesthesia Physical Anesthesia Plan  ASA: 3  Anesthesia Plan: General   Post-op Pain Management:    Induction: Intravenous  PONV Risk Score and Plan: 3 and Ondansetron , Dexamethasone  and Treatment may vary due to age or medical condition  Airway Management Planned: Oral ETT  Additional Equipment: None  Intra-op Plan:   Post-operative Plan: Extubation in OR  Informed Consent: I have reviewed the patients History and Physical, chart, labs and discussed the procedure including the risks, benefits and alternatives for the proposed anesthesia with the patient or authorized representative who has indicated his/her understanding and acceptance.     Dental advisory given  Plan Discussed with: CRNA  Anesthesia Plan Comments:         Anesthesia Quick Evaluation

## 2023-06-27 NOTE — H&P (View-Only) (Signed)
 Scheduled for ERCP today. Draw Hepatic panel to trend LFTs. Improvement in leukocytosis. NPO for procedure.

## 2023-06-28 DIAGNOSIS — E1169 Type 2 diabetes mellitus with other specified complication: Secondary | ICD-10-CM | POA: Diagnosis not present

## 2023-06-28 DIAGNOSIS — Z794 Long term (current) use of insulin: Secondary | ICD-10-CM

## 2023-06-28 DIAGNOSIS — K8689 Other specified diseases of pancreas: Secondary | ICD-10-CM | POA: Diagnosis not present

## 2023-06-28 DIAGNOSIS — Z122 Encounter for screening for malignant neoplasm of respiratory organs: Secondary | ICD-10-CM | POA: Diagnosis not present

## 2023-06-28 DIAGNOSIS — K831 Obstruction of bile duct: Secondary | ICD-10-CM | POA: Diagnosis not present

## 2023-06-28 LAB — COMPREHENSIVE METABOLIC PANEL
ALT: 453 U/L — ABNORMAL HIGH (ref 0–44)
AST: 357 U/L — ABNORMAL HIGH (ref 15–41)
Albumin: 3.2 g/dL — ABNORMAL LOW (ref 3.5–5.0)
Alkaline Phosphatase: 878 U/L — ABNORMAL HIGH (ref 38–126)
Anion gap: 9 (ref 5–15)
BUN: 18 mg/dL (ref 8–23)
CO2: 22 mmol/L (ref 22–32)
Calcium: 8.9 mg/dL (ref 8.9–10.3)
Chloride: 106 mmol/L (ref 98–111)
Creatinine, Ser: 1.17 mg/dL — ABNORMAL HIGH (ref 0.44–1.00)
GFR, Estimated: 49 mL/min — ABNORMAL LOW (ref >60)
Glucose, Bld: 275 mg/dL — ABNORMAL HIGH (ref 70–99)
Potassium: 4 mmol/L (ref 3.5–5.1)
Sodium: 137 mmol/L (ref 135–145)
Total Bilirubin: 1.9 mg/dL — ABNORMAL HIGH (ref 0.0–1.2)
Total Protein: 7 g/dL (ref 6.5–8.1)

## 2023-06-28 LAB — GLUCOSE, CAPILLARY
Glucose-Capillary: 229 mg/dL — ABNORMAL HIGH (ref 70–99)
Glucose-Capillary: 282 mg/dL — ABNORMAL HIGH (ref 70–99)

## 2023-06-28 LAB — CBC
HCT: 34.6 % — ABNORMAL LOW (ref 36.0–46.0)
Hemoglobin: 11.1 g/dL — ABNORMAL LOW (ref 12.0–15.0)
MCH: 30.7 pg (ref 26.0–34.0)
MCHC: 32.1 g/dL (ref 30.0–36.0)
MCV: 95.8 fL (ref 80.0–100.0)
Platelets: 229 10*3/uL (ref 150–400)
RBC: 3.61 MIL/uL — ABNORMAL LOW (ref 3.87–5.11)
RDW: 14.5 % (ref 11.5–15.5)
WBC: 6.5 10*3/uL (ref 4.0–10.5)
nRBC: 0 % (ref 0.0–0.2)

## 2023-06-28 LAB — LIPASE, BLOOD: Lipase: 38 U/L (ref 11–51)

## 2023-06-28 LAB — T3: T3, Total: 126 ng/dL (ref 71–180)

## 2023-06-28 MED ORDER — INSULIN ASPART 100 UNIT/ML FLEXPEN: 0.0000 [IU] | PEN_INJECTOR | Freq: Three times a day (TID) | SUBCUTANEOUS | 0 refills | Status: DC

## 2023-06-28 MED ORDER — BLOOD GLUCOSE TEST VI STRP: 1.0000 | ORAL_STRIP | Freq: Three times a day (TID) | 0 refills | Status: DC

## 2023-06-28 MED ORDER — CIPROFLOXACIN HCL 500 MG PO TABS: 500.0000 mg | ORAL_TABLET | Freq: Two times a day (BID) | ORAL | 0 refills | Status: AC

## 2023-06-28 MED ORDER — LANCET DEVICE MISC: 1.0000 | Freq: Three times a day (TID) | 0 refills | Status: DC

## 2023-06-28 MED ORDER — INSULIN GLARGINE-YFGN 100 UNIT/ML ~~LOC~~ SOLN
10.0000 [IU] | Freq: Every day | SUBCUTANEOUS | Status: DC
  Administered 2023-06-28: 10 [IU] via SUBCUTANEOUS
  Filled 2023-06-28: qty 0.1

## 2023-06-28 MED ORDER — LANCETS MISC: 1.0000 | Freq: Three times a day (TID) | 0 refills | Status: DC

## 2023-06-28 MED ORDER — BASAGLAR KWIKPEN 100 UNIT/ML ~~LOC~~ SOPN: 12.0000 [IU] | PEN_INJECTOR | Freq: Every day | SUBCUTANEOUS | 2 refills | Status: DC

## 2023-06-28 MED ORDER — BLOOD GLUCOSE MONITORING SUPPL DEVI: 1.0000 | Freq: Three times a day (TID) | 0 refills | Status: DC

## 2023-06-28 MED ORDER — PEN NEEDLES 31G X 5 MM MISC: 1.0000 | Freq: Three times a day (TID) | 0 refills | Status: DC

## 2023-06-28 NOTE — Plan of Care (Signed)
   Problem: Education: Goal: Ability to describe self-care measures that may prevent or decrease complications (Diabetes Survival Skills Education) will improve Outcome: Progressing Goal: Individualized Educational Video(s) Outcome: Progressing

## 2023-06-28 NOTE — Inpatient Diabetes Management (Signed)
 Inpatient Diabetes Program Recommendations  AACE/ADA: New Consensus Statement on Inpatient Glycemic Control (2015)  Target Ranges:  Prepandial:   less than 140 mg/dL      Peak postprandial:   less than 180 mg/dL (1-2 hours)      Critically ill patients:  140 - 180 mg/dL    Latest Reference Range & Units 06/27/23 07:40 06/27/23 11:42 06/27/23 17:39 06/27/23 20:24  Glucose-Capillary 70 - 99 mg/dL 674 (H)  11 units Novolog   158 (H)  3 units Novolog   5 mg Decadron  @ 1318  264 (H)  8 units Novolog   286 (H)  3 units Novolog    (H): Data is abnormally high  Latest Reference Range & Units 06/28/23 07:38  Glucose-Capillary 70 - 99 mg/dL 770 (H)  5 units Novolog   10 units Semglee   (H): Data is abnormally high    Home DM Meds: Metformin  XR 500 mg QAM   Current Orders: Semglee  10 units daily         Novolog  Moderate Correction Scale/ SSI (0-15 units) TID AC + HS    Note Semglee  10 units daily added this AM  Pt received Decadron  X 1 dose yest around 1pm which likely affected CBGs yest PM  Given pancreatic mass and likely pancreatic insufficiency, patient will likely need to be discharged on insulin  regimen for DM control.  Was Educated on how to use Insulin  Pen 01/03 by the Diabetes coordinator    --Will follow patient during hospitalization--  Adina Rudolpho Arrow RN, MSN, CDCES Diabetes Coordinator Inpatient Glycemic Control Team Team Pager: [phone] (8a-5p)

## 2023-06-28 NOTE — Discharge Summary (Signed)
 Physician Discharge Summary   Patient: Adrienne [name] MRN: [id number] DOB: 09/07/1945  Admit date:     06/26/2023  Discharge date: 06/28/23  Discharge Physician: Nydia Distance, MD    PCP: [name], MD   Recommendations at discharge:   Discontinued celecoxib , metformin  Started on basal insulin , Basaglar  12 units daily, sliding scale insulin , sensitive Outpatient GI follow-up scheduled 07/04/2023.  Discharge Diagnoses:    Obstructive jaundice with known pancreatic mass   Hypertension   Hypothyroidism   Ductal carcinoma in situ (DCIS) of right breast   Helicobacter pylori gastritis Diabetes mellitus type II likely from pancreatic insufficiency   GERD (gastroesophageal reflux disease)   Hospital Course:  Patient is a 78 year old female with CHF, HTN, HLP, was admitted 06/13/2023-06/16/2023, was found to have pancreatic mass with obstructive jaundice, underwent ERCP with stent placement but bile duct could not be cannulated and failed cholangiogram.  Plan was to repeat ERCP however LFTs and bilirubin improved and the drain by IR was deferred. Patient apparently had a routine follow-up with PCP on 06/14/2023 and noted to have transaminitis again.  Patient was recommended to come to the ER. Patient denied any fevers, worsening abdominal pain, had been tolerating diet.  Assessment and Plan:   Obstructive jaundice with known pancreatic mass, pancreatic and biliary dilation -Clinically stable, no cholangitis --GI was consulted, underwent ERCP on 1/3, failed cholangiogram, this was secondary attempted ERCP.  Pancreatic mass on MRCP with double duct sign.  Per Dr. Charlanne, IR consult for possible PTC as an outpatient, will also require EUS with biopsy likely, will discuss with Dr Wilhelmenia -- Patient was placed on clear liquid diet post ERCP now tolerating solid diet -Continue ciprofloxacin  for total of 3 days.   Sinus bradycardia -Noted history of sinus bradycardia, HR in 30s  to 40s this morning, asymptomatic -EKG showed sinus bradycardia, discussed with cardiology, no intervention needed. -Amlodipine  held   GERD (gastroesophageal reflux disease) Continue Pepcid    Leukocytosis Improving, possibly stress demargination -UA negative -Chest x-ray showed mild cardiomegaly, no infiltrates   Diabetes mellitus type 2, likely from pancreatic insufficiency -Hemoglobin A1c 8.1 on 06/24/2023 -Patient was placed on Semglee , sliding scale insulin  while inpatient.  Diabetic coronary was consulted. -Recommended patient now be on insulin  and started on Basaglar  FlexPen 12 units daily, sliding scale insulin  with meals   Helicobacter pylori gastritis Dx on EGD 12/21. Has not been started on abx therapy yet. GI prefers to defer therapy for H pylori treatment to discharge/after ERCP     Ductal carcinoma in situ (DCIS) of right breast This is remote. It does not look like patient is on any aromatase inhibitor.  . Per report from Dr. Lanny in 08/11/2019 : Ductal carcinoma in situ (DCIS) of right breast, Stage 0, ER+/PR+, Intermediate Grade -Status post right lumpectomy in 01/2019, reexcision surgery on 04/08/2019 and mastectomy on 07/26/2019, final surgical margins were negative -Outpatient follow-up with oncology   Hypothyroidism Continue thyroid  [place]   Hypertension Continue losartan , - amlodipine  held   Estimated body mass index is 29.72 kg/m as calculated from the following:   Height as of this encounter: 5' 3 (1.6 m).   Weight as of this encounter: 76.1 kg.       Pain control - [place]  Controlled Substance Reporting System database was reviewed. and patient was instructed, not to drive, operate heavy machinery, perform activities at heights, swimming or participation in water  activities or provide baby-sitting services while on Pain, Sleep and Anxiety Medications; until their outpatient  Physician has advised to do so again. Also recommended to not to take  more than prescribed Pain, Sleep and Anxiety Medications.  Consultants: Gastroenterology Procedures performed: ERCP Disposition: Home Diet recommendation:  Discharge Diet Orders (From admission, onward)     Start     Ordered   06/28/23 0000  Diet Carb Modified        06/28/23 1235            DISCHARGE MEDICATION: Allergies as of 06/28/2023       Reactions   Lisinopril  Swelling   Angioedema   Statins    Myalgias   Atorvastatin  Other (See Comments)   Muscle aches         Medication List     STOP taking these medications    amLODipine  10 MG tablet Commonly known as: NORVASC    celecoxib  100 MG capsule Commonly known as: CeleBREX    metFORMIN  500 MG 24 hr tablet Commonly known as: GLUCOPHAGE -XR       TAKE these medications    Basaglar  KwikPen 100 UNIT/ML Inject 12 Units into the skin daily. May substitute as needed per insurance.   Blood Glucose Monitoring Suppl Devi 1 each by Does not apply route in the morning, at noon, and at bedtime. May substitute to any manufacturer covered by patient's insurance. What changed: Another medication with the same name was added. Make sure you understand how and when to take each.   Blood Glucose Monitoring Suppl Devi 1 each by Does not apply route 3 (three) times daily. May dispense any manufacturer covered by patient's insurance. What changed: You were already taking a medication with the same name, and this prescription was added. Make sure you understand how and when to take each.   BLOOD GLUCOSE TEST STRIPS Strp 1 each by In Vitro route in the morning, at noon, and at bedtime. May substitute to any manufacturer covered by patient's insurance. What changed: Another medication with the same name was added. Make sure you understand how and when to take each.   BLOOD GLUCOSE TEST STRIPS Strp 1 each by Does not apply route 3 (three) times daily. Use as directed to check blood sugar. May dispense any manufacturer covered by  patient's insurance and fits patient's device. What changed: You were already taking a medication with the same name, and this prescription was added. Make sure you understand how and when to take each.   cetirizine  10 MG tablet Commonly known as: ZYRTEC  Take 1 tablet (10 mg total) by mouth daily. What changed:  when to take this reasons to take this   ciprofloxacin  500 MG tablet Commonly known as: CIPRO  Take 1 tablet (500 mg total) by mouth 2 (two) times daily for 2 days.   famotidine  20 MG tablet Commonly known as: PEPCID  Take 1 tablet (20 mg total) by mouth 2 (two) times daily.   fluticasone  50 MCG/ACT nasal spray Commonly known as: FLONASE  Place 2 sprays into both nostrils daily.   insulin  aspart 100 UNIT/ML FlexPen Commonly known as: NOVOLOG  Inject 0-6 Units into the skin 3 (three) times daily with meals. Check Blood Glucose (BG) and inject per scale: BG <150= 0 unit; BG 150-200= 1 unit; BG 201-250= 2 unit; BG 251-300= 3 unit; BG 301-350= 4 unit; BG 351-400= 5 unit; BG >400= 6 unit and Call Primary Care.   Lancet Device Misc 1 each by Does not apply route in the morning, at noon, and at bedtime. May substitute to any manufacturer covered by patient's insurance.  What changed: Another medication with the same name was added. Make sure you understand how and when to take each.   Lancet Device Misc 1 each by Does not apply route 3 (three) times daily. May dispense any manufacturer covered by patient's insurance. What changed: You were already taking a medication with the same name, and this prescription was added. Make sure you understand how and when to take each.   Lancets Misc 1 each by Does not apply route 3 (three) times daily. Use as directed to check blood sugar. May dispense any manufacturer covered by patient's insurance and fits patient's device.   losartan  100 MG tablet Commonly known as: COZAAR  Take 1 tablet by mouth once daily   Pen Needles 31G X 5 MM Misc 1  each by Does not apply route 3 (three) times daily. May dispense any manufacturer covered by patient's insurance.   thyroid  90 MG tablet Commonly known as: NP Thyroid  Take 1 tablet (90 mg total) by mouth daily.   Vitamin D -3 125 MCG (5000 UT) Tabs Take 1 tablet by mouth daily.        Follow-up Information     Cara Elida HERO, NP Follow up on 07/04/2023.   Specialty: Gastroenterology Why: at 10:30 AM, for hospital follow-up Contact information: [address] [place] [address] [phone]         [name], MD. Schedule an appointment as soon as possible for a visit in 2 week(s).   Specialty: Internal Medicine Why: for hospital follow-up Contact information: [address] [place] [address] [phone]                Discharge Exam: Fredricka Weights   06/26/23 1840  Weight: 76.1 kg   S: No acute complaints.  No nausea vomiting, abdominal pain, tolerating solid diet this morning.  Ready to discharge today.  Feels comfortable with insulin .  BP 138/61 (BP Location: Left Arm)   Pulse (!) 55   Temp 98.1 F (36.7 C) (Oral)   Resp 15   Ht 5' 3 (1.6 m)   Wt 76.1 kg   SpO2 99%   BMI 29.72 kg/m   Physical Exam General: Alert and oriented x 3, NAD Cardiovascular: S1 S2 clear, RRR.  Respiratory: CTAB, no wheezing, rales or rhonchi Gastrointestinal: Soft, nontender, nondistended, NBS Ext: no pedal edema bilaterally Neuro: no new deficits Psych: Normal affect    Condition at discharge: fair  The results of significant diagnostics from this hospitalization (including imaging, microbiology, ancillary and laboratory) are listed below for reference.   Imaging Studies: DG ERCP Result Date: 06/28/2023 CLINICAL DATA:  Elective surgery Fever Acalculous cholecystitis EXAM: ERCP TECHNIQUE: Multiple spot images obtained with the fluoroscopic device and submitted for interpretation post-procedure. FLUOROSCOPY: Radiation Exposure Index (as provided  by the fluoroscopic device): 23.2 mGy Kerma COMPARISON:  06/14/2023 FINDINGS: Three intraoperative fluoroscopic images were provided for interpretation. Pancreatic duct stent is again seen. Last image demonstrates endoscope in place with a guidewire extending medially in the right upper quadrant. The exact position of the guidewire is not certain. The pancreatic duct stent appears more inferiorly positioned. IMPRESSION: Intraoperative fluoroscopic images of ERCP as above. These images were submitted for radiologic interpretation only. Please see the procedural report for the amount of contrast and the fluoroscopy time utilized. Electronically Signed   By: Aliene Lloyd M.D.   On: 06/28/2023 09:02   DG C-Arm 1-60 Min-No Report Result Date: 06/27/2023 Fluoroscopy was utilized by the requesting physician.  No radiographic interpretation.   DG Chest 2 View Result Date: 06/26/2023 CLINICAL DATA:  [id number] Fever [id number] EXAM: CHEST - 2 VIEW COMPARISON:  Chest x-ray 12/28/2009 FINDINGS: Enlarged cardiac silhouette. Otherwise the heart and mediastinal contours are within normal limits. No focal consolidation. No pulmonary edema. No pleural effusion. No pneumothorax. No acute osseous abnormality.  Right chest wall surgical clips. IMPRESSION: Mild cardiomegaly with no active cardiopulmonary disease. Electronically Signed   By: Morgane  Naveau M.D.   On: 06/26/2023 22:48   DG ERCP Result Date: 06/14/2023 CLINICAL DATA:  Acalculous cholecystitis; probable malignant biliary stricture EXAM: ERCP TECHNIQUE: Multiple spot images obtained with the fluoroscopic device and submitted for interpretation post-procedure. FLUOROSCOPY: Radiation Exposure Index (as provided by the fluoroscopic device): 84.7 MGy Kerma COMPARISON:  MRCP 06/14/2023 FINDINGS: A total of 9 intraoperative spot images are submitted for review. The images demonstrate cannulation of the pancreatic duct with balloon occluded ductogram. There is diffuse pancreatic  ductal dilatation. The final images demonstrate placement of a temporary plastic pancreatic duct stent. IMPRESSION: 1. Diffuse pancreatic ductal dilatation. 2. Placement of a plastic pancreatic duct stent. These images were submitted for radiologic interpretation only. Please see the procedural report for the amount of contrast and the fluoroscopy time utilized. Electronically Signed   By: Wilkie Lent M.D.   On: 06/14/2023 19:52   MR ABDOMEN MRCP W WO CONTAST Result Date: 06/14/2023 CLINICAL DATA:  Abdominal pain pancreatic mass suspected by prior CT EXAM: MRI ABDOMEN WITHOUT AND WITH CONTRAST (INCLUDING MRCP) TECHNIQUE: Multiplanar multisequence MR imaging of the abdomen was performed both before and after the administration of intravenous contrast. Heavily T2-weighted images of the biliary and pancreatic ducts were obtained, and three-dimensional MRCP images were rendered by post processing. CONTRAST:  7mL GADAVIST  GADOBUTROL  1 MMOL/ML IV SOLN COMPARISON:  CT abdomen pelvis, 06/13/2023 FINDINGS: Lower chest: No acute abnormality.  Cardiomegaly. Hepatobiliary: No solid liver abnormality is seen. Status post cholecystectomy. Severe intra and extrahepatic biliary ductal dilatation the common bile duct effaced within the superior pancreatic head and the extrahepatic duct measuring up to 1.7 cm in caliber. Pancreas: Vague, masslike fullness of the central pancreatic head measuring 2.5 x 2.0 cm (series 16, image 59). Minimal underlying diffusion restriction and hypoenhancement. Severe pancreatic ductal dilatation distally, measuring up to 1.3 cm in caliber, with mild pancreatic parenchymal atrophy. Spleen: Normal in size without significant abnormality. Adrenals/Urinary Tract: Adrenal glands are unremarkable. Low-lying, under rotated right kidney, incompletely imaged. Left kidney is normal, without obvious renal calculi, solid lesion, or hydronephrosis. Stomach/Bowel: Stomach is within normal limits. No  evidence of bowel wall thickening, distention, or inflammatory changes. Vascular/Lymphatic: Aortic atherosclerosis. The portal confluence and central superior mesenteric vein appear to be encased and partially effaced in the vicinity of suspected pancreatic head mass (series 2, image 44). Fat planes to the celiac axis and superior mesenteric artery are preserved. No enlarged abdominal lymph nodes. Other: No abdominal wall hernia or abnormality. No ascites. Musculoskeletal: No acute or significant osseous findings. IMPRESSION: 1. Vague, masslike fullness of the central pancreatic head measuring 2.5 x 2.0 cm. Minimal underlying diffusion restriction and hypoenhancement. Severe pancreatic ductal dilatation distally, measuring up to 1.3 cm in caliber, with mild pancreatic parenchymal atrophy. Findings are highly concerning for pancreatic adenocarcinoma. 2. Severe intra and extrahepatic biliary ductal dilatation the common bile duct effaced within the superior pancreatic head and the extrahepatic duct measuring up to 1.7 cm in caliber. 3. The portal confluence and central superior mesenteric vein appear to be encased and  partially effaced in the vicinity of suspected pancreatic head mass. 4. Status post cholecystectomy. 5. No evidence of lymphadenopathy or metastatic disease in the abdomen. 6. Cardiomegaly. Aortic Atherosclerosis (ICD10-I70.0). Electronically Signed   By: Marolyn JONETTA Jaksch M.D.   On: 06/14/2023 10:18   MR 3D Recon At Scanner Result Date: 06/14/2023 CLINICAL DATA:  Abdominal pain pancreatic mass suspected by prior CT EXAM: MRI ABDOMEN WITHOUT AND WITH CONTRAST (INCLUDING MRCP) TECHNIQUE: Multiplanar multisequence MR imaging of the abdomen was performed both before and after the administration of intravenous contrast. Heavily T2-weighted images of the biliary and pancreatic ducts were obtained, and three-dimensional MRCP images were rendered by post processing. CONTRAST:  7mL GADAVIST  GADOBUTROL  1 MMOL/ML  IV SOLN COMPARISON:  CT abdomen pelvis, 06/13/2023 FINDINGS: Lower chest: No acute abnormality.  Cardiomegaly. Hepatobiliary: No solid liver abnormality is seen. Status post cholecystectomy. Severe intra and extrahepatic biliary ductal dilatation the common bile duct effaced within the superior pancreatic head and the extrahepatic duct measuring up to 1.7 cm in caliber. Pancreas: Vague, masslike fullness of the central pancreatic head measuring 2.5 x 2.0 cm (series 16, image 59). Minimal underlying diffusion restriction and hypoenhancement. Severe pancreatic ductal dilatation distally, measuring up to 1.3 cm in caliber, with mild pancreatic parenchymal atrophy. Spleen: Normal in size without significant abnormality. Adrenals/Urinary Tract: Adrenal glands are unremarkable. Low-lying, under rotated right kidney, incompletely imaged. Left kidney is normal, without obvious renal calculi, solid lesion, or hydronephrosis. Stomach/Bowel: Stomach is within normal limits. No evidence of bowel wall thickening, distention, or inflammatory changes. Vascular/Lymphatic: Aortic atherosclerosis. The portal confluence and central superior mesenteric vein appear to be encased and partially effaced in the vicinity of suspected pancreatic head mass (series 2, image 44). Fat planes to the celiac axis and superior mesenteric artery are preserved. No enlarged abdominal lymph nodes. Other: No abdominal wall hernia or abnormality. No ascites. Musculoskeletal: No acute or significant osseous findings. IMPRESSION: 1. Vague, masslike fullness of the central pancreatic head measuring 2.5 x 2.0 cm. Minimal underlying diffusion restriction and hypoenhancement. Severe pancreatic ductal dilatation distally, measuring up to 1.3 cm in caliber, with mild pancreatic parenchymal atrophy. Findings are highly concerning for pancreatic adenocarcinoma. 2. Severe intra and extrahepatic biliary ductal dilatation the common bile duct effaced within the  superior pancreatic head and the extrahepatic duct measuring up to 1.7 cm in caliber. 3. The portal confluence and central superior mesenteric vein appear to be encased and partially effaced in the vicinity of suspected pancreatic head mass. 4. Status post cholecystectomy. 5. No evidence of lymphadenopathy or metastatic disease in the abdomen. 6. Cardiomegaly. Aortic Atherosclerosis (ICD10-I70.0). Electronically Signed   By: Marolyn JONETTA Jaksch M.D.   On: 06/14/2023 10:18   CT ABDOMEN PELVIS W CONTRAST Result Date: 06/13/2023 CLINICAL DATA:  Abdominal pain, acute, nonlocalized. Left lower side abdominal pain and brown urine since Sunday. EXAM: CT ABDOMEN AND PELVIS WITH CONTRAST TECHNIQUE: Multidetector CT imaging of the abdomen and pelvis was performed using the standard protocol following bolus administration of intravenous contrast. RADIATION DOSE REDUCTION: This exam was performed according to the departmental dose-optimization program which includes automated exposure control, adjustment of the mA and/or kV according to patient size and/or use of iterative reconstruction technique. CONTRAST:  OMNIPAQUE  IOHEXOL  300 MG/ML  SOLN COMPARISON:  11/13/2016. FINDINGS: Lower chest: Heart is mildly enlarged. Mild atelectasis or scarring is present at the lung bases. Hepatobiliary: No focal liver abnormality is seen. Status post cholecystectomy. Intrahepatic and extrahepatic biliary ductal dilatation are noted. The common bile duct  measures up to 1.6 cm. Pancreas: There is an ill-defined hypodense region in the head of the pancreas measuring 2.9 x 2.2 cm. There is dilatation of the pancreatic duct up to 1.2 cm distal to this region. No surrounding inflammatory changes are seen. Spleen: Normal in size without focal abnormality. Adrenals/Urinary Tract: The adrenal glands are within normal limits. The kidneys enhance symmetrically. 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 The right kidney is pelvic in location.  No renal calculus or hydronephrosis is seen. The visualized portion of the urinary bladder is within normal limits. The bladder is not well seen due to streak hardware artifact. Stomach/Bowel: A small hiatal hernia is noted. Stomach is within normal limits. Appendix is not seen. No evidence of bowel wall thickening, distention, or inflammatory changes. No free air or pneumatosis is seen. A right inguinal hernia is noted containing nonobstructed small bowel. Vascular/Lymphatic: Aortic atherosclerosis. No enlarged abdominal or pelvic lymph nodes. Reproductive: Status post hysterectomy. No adnexal masses. Other: No abdominopelvic ascites. Musculoskeletal: Total hip arthroplasty changes are noted bilaterally. Degenerative changes are present in the thoracolumbar spine. No acute osseous abnormality is seen. IMPRESSION: 1. No acute process to explain reported left lower quadrant abdominal pain. 2. Vague hypodense region in the head of the pancreas with biliary and pancreatic ductal dilatation, concerning for possible pancreatic adenocarcinoma. MRI with pancreatic protocol and MRCP are recommended for further evaluation. 3. Small hiatal hernia. 4. Right inguinal hernia containing nonobstructed small bowel. 5. Aortic atherosclerosis. Electronically Signed   By: Leita Birmingham M.D.   On: 06/13/2023 21:50    Microbiology: Results for orders placed or performed during the hospital encounter of 06/26/23  Surgical pcr screen     Status: None   Collection Time: 06/27/23  1:00 AM   Specimen: Nasal Mucosa; Nasal Swab  Result Value Ref Range Status   MRSA, PCR NEGATIVE NEGATIVE Final   Staphylococcus aureus NEGATIVE NEGATIVE Final    Comment: (NOTE) The Xpert SA Assay (FDA approved for NASAL specimens in patients 69 years of age and older), is one component of a comprehensive surveillance program. It is not intended to diagnose infection nor to guide or monitor treatment. Performed at [facility],  [address], [place], [place] [address]     Labs: CBC: Recent Labs  Lab 06/24/23 [phone] 06/26/23 1352 06/27/23 0346 06/28/23 0341  WBC 8.3 11.4* 9.8 6.5  NEUTROABS 5.4 7.2  --   --   HGB 12.4 12.3 11.2* 11.1*  HCT 38.6 38.0 34.1* 34.6*  MCV 95 96.9 95.3 95.8  PLT 248 255 222 229   Basic Metabolic Panel: Recent Labs  Lab 06/24/23 0859 06/26/23 1352 06/27/23 0346 06/28/23 0341  NA 142 135 139 137  K 4.3 3.5 4.0 4.0  CL 102 103 108 106  CO2 21 23 22 22   GLUCOSE 374* 569* 307* 275*  BUN 19 23 18 18   CREATININE 1.31* 1.22* 0.87 1.17*  CALCIUM  9.8 9.6 9.2 8.9   Liver Function Tests: Recent Labs  Lab 06/24/23 0859 06/26/23 1352 06/27/23 0907 06/28/23 0341  AST 266* 72* 441* 357*  ALT 561* 296* 344* 453*  ALKPHOS 1,379* 821* 804* 878*  BILITOT 1.8* 1.6* 2.5* 1.9*  PROT 7.4 8.0 7.0 7.0  ALBUMIN 4.2 3.8 3.3* 3.2*   CBG: Recent Labs  Lab 06/27/23 1142 06/27/23 1739 06/27/23 2024 06/28/23 0738 06/28/23 1130  GLUCAP 158* 264* 286* 229* 282*    Discharge time spent: greater than 30 minutes.  Signed: Nydia Distance,  MD [facility] 06/28/2023

## 2023-06-30 ENCOUNTER — Encounter (HOSPITAL_COMMUNITY): Payer: Self-pay | Admitting: Gastroenterology

## 2023-06-30 ENCOUNTER — Telehealth: Payer: Self-pay

## 2023-06-30 NOTE — Transitions of Care (Post Inpatient/ED Visit) (Signed)
   06/30/2023  Name: Adrienne [name] MRN: [id number] DOB: 23-Nov-1945  Today's TOC FU Call Status: Today's TOC FU Call Status:: Unsuccessful Call (1st Attempt) Unsuccessful Call (1st Attempt) Date: 06/30/23  Attempted to reach the patient regarding the most recent Inpatient/ED visit.  Follow Up Plan: Additional outreach attempts will be made to reach the patient to complete the Transitions of Care (Post Inpatient/ED visit) call.   Alan Ee, RN, BSN, CEN [employer]- Transition of Care Team.  [facility] [phone]

## 2023-07-01 ENCOUNTER — Telehealth: Payer: Self-pay

## 2023-07-01 ENCOUNTER — Ambulatory Visit (HOSPITAL_COMMUNITY): Payer: PPO

## 2023-07-01 ENCOUNTER — Encounter (HOSPITAL_COMMUNITY): Payer: PPO

## 2023-07-01 NOTE — Transitions of Care (Post Inpatient/ED Visit) (Signed)
   07/01/2023  Name: Adrienne [name] MRN: [id number] DOB: 1946/03/04  Today's TOC FU Call Status: Today's TOC FU Call Status:: Successful TOC FU Call Completed TOC FU Call Complete Date: 07/01/23 Patient's Name and Date of Birth confirmed.  Transition Care Management Follow-up Telephone Call Discharge Facility: [facility] Type of Discharge: Inpatient Admission How have you been since you were released from the hospital?: Better (reports that she is feeling fine, denies itching.) Any questions or concerns?: No  Items Reviewed: Did you receive and understand the discharge instructions provided?: Yes Medications obtained,verified, and reconciled?: Yes (Medications Reviewed) Any new allergies since your discharge?: No Dietary orders reviewed?: Yes Type of Diet Ordered:: carb modified Do you have support at home?: Yes People in Home: child(ren), adult  Medications Reviewed Today: Medications Reviewed Today   Medications were not reviewed in this encounter     Home Care and Equipment/Supplies: Were Home Health Services Ordered?: No Any new equipment or medical supplies ordered?: No  Functional Questionnaire: Do you need assistance with bathing/showering or dressing?: No Do you need assistance with meal preparation?: No Do you need assistance with eating?: No Do you have difficulty maintaining continence: No Do you need assistance with getting out of bed/getting out of a chair/moving?: No Do you have difficulty managing or taking your medications?: No  Follow up appointments reviewed: PCP Follow-up appointment confirmed?: No (patient will call and make an appointment) MD Provider Line [phone] Given: No Specialist Hospital Follow-up appointment confirmed?: Yes Date of Specialist follow-up appointment?: 07/04/23 Follow-Up Specialty Provider:: GI Do you need transportation to your follow-up appointment?: No Do you understand care options if your  condition(s) worsen?: Yes-patient verbalized understanding  SDOH Interventions Today    Flowsheet Row Most Recent Value  SDOH Interventions   Food Insecurity Interventions Intervention Not Indicated  Housing Interventions Intervention Not Indicated  Transportation Interventions Intervention Not Indicated  Utilities Interventions Intervention Not Indicated     Patient reports that she is doing well. Reports no pain, no itching, no NVD.  States she has some questions about her diet and will discuss with specialist on Friday during office visit.  Reviewed basic carb modification with patient ( like avoid or limit sweets, candy, cookies, pies, Reviewed potatoes, rice and pasta turn into sugar)  Patient reports she will make her follow up PCP appointment.  Offered 30 day TOC program and patient declined. Encouraged patient to call me if needed. Provided my contact information.  Interventions Today    Flowsheet Row Most Recent Value  Nutrition Interventions   Nutrition Discussed/Reviewed Carbohydrate meal planning  Pharmacy Interventions   Pharmacy Dicussed/Reviewed Medications and their functions         Alan Ee, RN, BSN, [employer] Population Health- Transition of Care Team.  [facility] [phone]

## 2023-07-03 ENCOUNTER — Inpatient Hospital Stay: Payer: PPO | Admitting: Nurse Practitioner

## 2023-07-03 ENCOUNTER — Encounter: Payer: Self-pay | Admitting: Nurse Practitioner

## 2023-07-03 ENCOUNTER — Telehealth: Payer: Self-pay

## 2023-07-03 ENCOUNTER — Inpatient Hospital Stay: Payer: PPO | Attending: Nurse Practitioner | Admitting: Nurse Practitioner

## 2023-07-03 VITALS — BP 182/89 | HR 55 | Temp 97.8°F | Resp 16 | Ht 63.0 in | Wt 169.6 lb

## 2023-07-03 DIAGNOSIS — K8689 Other specified diseases of pancreas: Secondary | ICD-10-CM

## 2023-07-03 DIAGNOSIS — R1900 Intra-abdominal and pelvic swelling, mass and lump, unspecified site: Secondary | ICD-10-CM

## 2023-07-03 DIAGNOSIS — Z86000 Personal history of in-situ neoplasm of breast: Secondary | ICD-10-CM | POA: Diagnosis not present

## 2023-07-03 DIAGNOSIS — Z9011 Acquired absence of right breast and nipple: Secondary | ICD-10-CM | POA: Diagnosis not present

## 2023-07-03 DIAGNOSIS — I10 Essential (primary) hypertension: Secondary | ICD-10-CM | POA: Diagnosis not present

## 2023-07-03 DIAGNOSIS — Z87891 Personal history of nicotine dependence: Secondary | ICD-10-CM | POA: Diagnosis not present

## 2023-07-03 DIAGNOSIS — E119 Type 2 diabetes mellitus without complications: Secondary | ICD-10-CM | POA: Insufficient documentation

## 2023-07-03 LAB — CBC WITH DIFFERENTIAL (CANCER CENTER ONLY)
Abs Immature Granulocytes: 0.03 10*3/uL (ref 0.00–0.07)
Basophils Absolute: 0 10*3/uL (ref 0.0–0.1)
Basophils Relative: 0 %
Eosinophils Absolute: 0 10*3/uL (ref 0.0–0.5)
Eosinophils Relative: 0 %
HCT: 39.3 % (ref 36.0–46.0)
Hemoglobin: 13.2 g/dL (ref 12.0–15.0)
Immature Granulocytes: 0 %
Lymphocytes Relative: 31 %
Lymphs Abs: 2.4 10*3/uL (ref 0.7–4.0)
MCH: 30.8 pg (ref 26.0–34.0)
MCHC: 33.6 g/dL (ref 30.0–36.0)
MCV: 91.6 fL (ref 80.0–100.0)
Monocytes Absolute: 0.5 10*3/uL (ref 0.1–1.0)
Monocytes Relative: 6 %
Neutro Abs: 4.7 10*3/uL (ref 1.7–7.7)
Neutrophils Relative %: 63 %
Platelet Count: 243 10*3/uL (ref 150–400)
RBC: 4.29 MIL/uL (ref 3.87–5.11)
RDW: 14.5 % (ref 11.5–15.5)
WBC Count: 7.8 10*3/uL (ref 4.0–10.5)
nRBC: 0 % (ref 0.0–0.2)

## 2023-07-03 LAB — CMP (CANCER CENTER ONLY)
ALT: 585 U/L (ref 0–44)
AST: 492 U/L (ref 15–41)
Albumin: 4 g/dL (ref 3.5–5.0)
Alkaline Phosphatase: 1310 U/L — ABNORMAL HIGH (ref 38–126)
Anion gap: 8 (ref 5–15)
BUN: 16 mg/dL (ref 8–23)
CO2: 24 mmol/L (ref 22–32)
Calcium: 9.9 mg/dL (ref 8.9–10.3)
Chloride: 105 mmol/L (ref 98–111)
Creatinine: 1.12 mg/dL — ABNORMAL HIGH (ref 0.44–1.00)
GFR, Estimated: 51 mL/min — ABNORMAL LOW (ref >60)
Glucose, Bld: 252 mg/dL — ABNORMAL HIGH (ref 70–99)
Potassium: 3.8 mmol/L (ref 3.5–5.1)
Sodium: 137 mmol/L (ref 135–145)
Total Bilirubin: 6.5 mg/dL (ref 0.0–1.2)
Total Protein: 7.5 g/dL (ref 6.5–8.1)

## 2023-07-03 NOTE — Telephone Encounter (Signed)
 CRITICAL VALUE STICKER  CRITICAL VALUE: Total Bil. 6.5, AST 492, ALT 585  RECEIVER (on-site recipient of call): Norleen Spain CMA  DATE & TIME NOTIFIED: 07/03/2023 1430  MESSENGER (representative from lab): Amber in Lab  MD NOTIFIED: Dr. Lanny  TIME OF NOTIFICATION: 1430  RESPONSE:  Made doctor aware

## 2023-07-03 NOTE — Progress Notes (Addendum)
 [facility]   [phone] [phone]   Clinic New Consult Note   Patient Care Team: [name], MD as PCP - General (Internal Medicine) [name], MD as PCP - Cardiology (Cardiology) [name], RN as Oncology Nurse Navigator Glean Stephane BROCKS, RN as Oncology Nurse Navigator [name], MD as Consulting Physician (General Surgery) [name], MD as Consulting Physician (Hematology) [name], MD as Attending Physician (Radiation Oncology) Nicholaus Sherlean CROME, [facility] (Inactive) as Pharmacist (Pharmacist) 07/03/2023  CHIEF COMPLAINTS/PURPOSE OF CONSULTATION:  Pancreatic cancer, referred by Dr. Shila  History of Present Illness   Adrienne [name]. Klaus is a 78 year old female with PMH including HTN, HL, emphysema, GERD, OA, elevated glucose, and right breast DCIS (seen Dr. Lanny in 2020/2021) presents for pancreatic mass.  She presented to the ED 06/13/2023 with obstructive jaundice, T. bili 6.1; work up with CT AP showed an ill-defined hypodense region in the head of the pancreas measuring 2.9 x 2.2 cm with pancreatic ductal dilatation concerning for malignancy.  Dedicated MRI 12/21 showed vague masslike fullness in the central hepatic head measuring 2.5 x 2.0 cm with pancreatic and biliary ductal dilatation also showing the portal confluence and central SMV appeared to be encased and partially effaced by the mass.  She underwent ERCP with stenting of the pancreatic duct, but unable to cannulate the bile duct (Dr. Avram).  LFTs improved and discharged 12/23.  Due to rising alk phos and ALT on 12/31 and a blood glucose 569 on 06/26/23 she was referred back to the ER for admission. Unfortunately second attempt at ERCP 06/27/23 to stent to bile duct failed (Dr. Charlanne). Discharged 06/28/23 with plans for IR consult for possible PTC and outpatient EUS for diagnosis.  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   Independent with ADLs and active, drives.  Denies alcohol or drug use, smoked cigarettes 1 pack/day x 30 years, quit in 2018.  Patient is up-to-date on age-appropriate cancer screenings, as sister had lymphoma and another sister had breast cancer.   Today she presents with her daughter, not exactly sure why she is here.  Feels well, has recovered from hospitalization.  She is eating and drinking well with good energy level.  Has lost 8 pounds intentionally to lower A1c.  Urine remains intermittently dark.  Denies nausea/vomiting, abdominal pain or bloating, fever, chills, cough, chest pain, dyspnea, leg edema, or any other complaints.    MEDICAL HISTORY:  Past Medical History:  Diagnosis Date   Allergy    Ambulates with cane    straight cane   Arthritis    knee, back   CHF (congestive heart failure) (HCC)    Ductal carcinoma in situ of breast 12/2018   R Breast-mastectomy only   Gout    History of blood transfusion 1986   w/ Hysterectomy surgery   Hyperlipidemia    diet controlled - no meds   Hypertension    Hypothyroidism    Pelvic kidney    lower right pelvic kidney    SBO (small bowel obstruction) (HCC) 10/2016   surgery    Sleep apnea    Mild - no mask needed per sleep study   Smoker    quit smoking 2018   Type 2 diabetes mellitus (HCC)     SURGICAL HISTORY: Past Surgical History:  Procedure Laterality Date   ABDOMINAL HYSTERECTOMY  1986   COMPLETE-precancerous   APPENDECTOMY     pt  states it was removed when gallbladder was removed.   AUGMENTATION MAMMAPLASTY     BACK SURGERY     lower back   BIOPSY  06/14/2023   Procedure: BIOPSY;  Surgeon: Avram Lupita BRAVO, MD;  Location: [facility];  Service: Gastroenterology;;   BREAST BIOPSY Right 05/12/2019   times 2   BREAST LUMPECTOMY WITH RADIOACTIVE SEED LOCALIZATION Right 03/18/2019   Procedure: RIGHT BREAST LUMPECTOMY WITH RADIOACTIVE SEED LOCALIZATION;  Surgeon: [name], MD;  Location: [facility];  Service:  General;  Laterality: Right;   CHOLECYSTECTOMY     COLONOSCOPY  02/15/2008   Kaplan    COLONOSCOPY WITH PROPOFOL   02/27/2018   Dr.Nandigam   ECTOPIC PREGNANCY SURGERY     ERCP N/A 06/14/2023   Procedure: ENDOSCOPIC RETROGRADE CHOLANGIOPANCREATOGRAPHY (ERCP);  Surgeon: Avram Lupita BRAVO, MD;  Location: [facility];  Service: Gastroenterology;  Laterality: N/A;   ERCP N/A 06/27/2023   Procedure: ENDOSCOPIC RETROGRADE CHOLANGIOPANCREATOGRAPHY (ERCP);  Surgeon: Charlanne Groom, MD;  Location: [facility];  Service: Gastroenterology;  Laterality: N/A;   ESOPHAGOGASTRODUODENOSCOPY (EGD) WITH PROPOFOL  N/A 06/14/2023   Procedure: ESOPHAGOGASTRODUODENOSCOPY (EGD) WITH PROPOFOL ;  Surgeon: Avram Lupita BRAVO, MD;  Location: [facility];  Service: Gastroenterology;  Laterality: N/A;   JOINT REPLACEMENT     Left hip total Dr. Melodi 10-01-17   MALONEY DILATION  06/14/2023   Procedure: AGAPITO DILATION;  Surgeon: Avram Lupita BRAVO, MD;  Location: [facility];  Service: Gastroenterology;;   MASTECTOMY Right 07/26/2019   PANCREATIC STENT PLACEMENT  06/14/2023   Procedure: PANCREATIC STENT PLACEMENT;  Surgeon: Avram Lupita BRAVO, MD;  Location: [facility];  Service: Gastroenterology;;   PANCREATIC STENT PLACEMENT  06/27/2023   Procedure: PANCREATIC STENT PLACEMENT;  Surgeon: Charlanne Groom, MD;  Location: [facility];  Service: Gastroenterology;;   POLYPECTOMY     polypectomy-oropharynx     RE-EXCISION OF BREAST CANCER,SUPERIOR MARGINS Right 04/08/2019   Procedure: RE-EXCISION OF RIGHT BREAST ANTERIOR MARGINS;  Surgeon: Curvin Deward III, MD;  Location: [facility];  Service: General;  Laterality: Right;   right knee meniscus     SBO  10/2016   small bowel obstruction   SIMPLE MASTECTOMY WITH AXILLARY SENTINEL NODE BIOPSY Right 07/26/2019   Procedure: RIGHT MASTECTOMY WITH SENTINEL NODE BIOPSY;  Surgeon: [name], MD;  Location: [facility];  Service: General;  Laterality: Right;   SPHINCTEROTOMY   06/27/2023   Procedure: SPHINCTEROTOMY;  Surgeon: Charlanne Groom, MD;  Location: [facility];  Service: Gastroenterology;;   TOTAL HIP ARTHROPLASTY Left 10/01/2017   Procedure: LEFT  TOTAL HIP ARTHROPLASTY ANTERIOR APPROACH;  Surgeon: Melodi Lerner, MD;  Location: [facility];  Service: Orthopedics;  Laterality: Left;   TOTAL HIP ARTHROPLASTY Right 03/11/2018   Procedure: RIGHT TOTAL HIP ARTHROPLASTY ANTERIOR APPROACH;  Surgeon: Melodi Lerner, MD;  Location: [facility];  Service: Orthopedics;  Laterality: Right;    UPPER GASTROINTESTINAL ENDOSCOPY      SOCIAL HISTORY: Social History   Socioeconomic History   Marital status: Widowed    Spouse name: Not on file   Number of children: 3   Years of education: 30   Highest education level: Some college, no degree  Occupational History   Occupation: retired  Tobacco Use   Smoking status: Former    Current packs/day: 0.00    Average packs/day: 1 pack/day for 32.0 years (32.0 ttl pk-yrs)    Types: E-cigarettes, Cigarettes    Start date: 11/20/1984    Quit date: 11/20/2016    Years since quitting:  6.6   Smokeless tobacco: Never   Tobacco comments:    Smoker since 1975 has quit and started back several times!!,  Vaping Use   Vaping status: Former  Substance and Sexual Activity   Alcohol use: No   Drug use: No   Sexual activity: Not Currently    Birth control/protection: Surgical    Comment: Hysterectomy  Other Topics Concern   Not on file  Social History Narrative   Widow since 2006.Married previously for 25 years.Lives with grandson and 2 great grandchildren.Retired.Previously office work.   Social Drivers of Corporate Investment Banker Strain: Low Risk  (06/27/2021)   Overall Financial Resource Strain (CARDIA)    Difficulty of Paying Living Expenses: Not hard at all  Food Insecurity: No Food Insecurity (07/01/2023)   Hunger Vital Sign    Worried About Running Out of Food in the Last Year: Never true    Ran Out of Food in the Last  Year: Never true  Transportation Needs: No Transportation Needs (07/01/2023)   PRAPARE - Administrator, Civil Service (Medical): No    Lack of Transportation (Non-Medical): No  Physical Activity: Inactive (06/27/2021)   Exercise Vital Sign    Days of Exercise per Week: 0 days    Minutes of Exercise per Session: 0 min  Stress: No Stress Concern Present (06/27/2021)   [employer] of Occupational Health - Occupational Stress Questionnaire    Feeling of Stress : Not at all  Social Connections: Moderately Integrated (06/26/2023)   Social Connection and Isolation Panel [NHANES]    Frequency of Communication with Friends and Family: More than three times a week    Frequency of Social Gatherings with Friends and Family: Once a week    Attends Religious Services: More than 4 times per year    Active Member of [employer] or Organizations: Yes    Attends Banker Meetings: More than 4 times per year    Marital Status: Widowed  Intimate Partner Violence: Not At Risk (07/01/2023)   Humiliation, Afraid, Rape, and Kick questionnaire    Fear of Current or Ex-Partner: No    Emotionally Abused: No    Physically Abused: No    Sexually Abused: No    FAMILY HISTORY: Family History  Problem Relation Age of Onset   Cancer Sister    Diabetes Brother    Diabetes Brother    Hypertension Other    Breast cancer Sister    Colon cancer Neg Hx    Colon polyps Neg Hx    Rectal cancer Neg Hx    Stomach cancer Neg Hx     ALLERGIES:  is allergic to lisinopril , statins, and atorvastatin .  MEDICATIONS:  Current Outpatient Medications  Medication Sig Dispense Refill   Blood Glucose Monitoring Suppl DEVI 1 each by Does not apply route in the morning, at noon, and at bedtime. May substitute to any manufacturer covered by patient's insurance. 1 each 0   Blood Glucose Monitoring Suppl DEVI 1 each by Does not apply route 3 (three) times daily. May dispense any manufacturer covered by patient's  insurance. 1 each 0   cetirizine  (ZYRTEC ) 10 MG tablet Take 1 tablet (10 mg total) by mouth daily. (Patient taking differently: Take 10 mg by mouth daily as needed for allergies.) 30 tablet 11   Cholecalciferol (VITAMIN D -3) 125 MCG (5000 UT) TABS Take 1 tablet by mouth daily.     famotidine  (PEPCID ) 20 MG tablet Take 1 tablet (20 mg total)  by mouth 2 (two) times daily. 30 tablet 1   fluticasone  (FLONASE ) 50 MCG/ACT nasal spray Place 2 sprays into both nostrils daily. 16 g 6   Glucose Blood (BLOOD GLUCOSE TEST STRIPS) STRP 1 each by In Vitro route in the morning, at noon, and at bedtime. May substitute to any manufacturer covered by patient's insurance. 100 strip 0   Glucose Blood (BLOOD GLUCOSE TEST STRIPS) STRP 1 each by Does not apply route 3 (three) times daily. Use as directed to check blood sugar. May dispense any manufacturer covered by patient's insurance and fits patient's device. 100 strip 0   insulin  aspart (NOVOLOG ) 100 UNIT/ML FlexPen Inject 0-6 Units into the skin 3 (three) times daily with meals. Check Blood Glucose (BG) and inject per scale: BG <150= 0 unit; BG 150-200= 1 unit; BG 201-250= 2 unit; BG 251-300= 3 unit; BG 301-350= 4 unit; BG 351-400= 5 unit; BG >400= 6 unit and Call Primary Care. 15 mL 0   Insulin  Glargine (BASAGLAR  KWIKPEN) 100 UNIT/ML Inject 12 Units into the skin daily. May substitute as needed per insurance. 15 mL 2   Insulin  Pen Needle (PEN NEEDLES) 31G X 5 MM MISC 1 each by Does not apply route 3 (three) times daily. May dispense any manufacturer covered by patient's insurance. 100 each 0   Lancet Device MISC 1 each by Does not apply route in the morning, at noon, and at bedtime. May substitute to any manufacturer covered by patient's insurance. 1 each 0   Lancet Device MISC 1 each by Does not apply route 3 (three) times daily. May dispense any manufacturer covered by patient's insurance. 1 each 0   Lancets MISC 1 each by Does not apply route 3 (three) times daily.  Use as directed to check blood sugar. May dispense any manufacturer covered by patient's insurance and fits patient's device. 100 each 0   losartan  (COZAAR ) 100 MG tablet Take 1 tablet by mouth once daily 90 tablet 0   thyroid  (NP THYROID ) 90 MG tablet Take 1 tablet (90 mg total) by mouth daily. 30 tablet 5   No current facility-administered medications for this visit.    REVIEW OF SYSTEMS:   Constitutional: Denies fevers, chills or abnormal night sweats (+) intentional weight loss Eyes: Denies blurriness of vision, double vision or watery eyes Ears, nose, mouth, throat, and face: Denies mucositis or sore throat Respiratory: Denies cough, dyspnea or wheezes Cardiovascular: Denies palpitation, chest discomfort or lower extremity swelling Gastrointestinal:  Denies nausea, vomiting, constipation, diarrhea, heartburn, pain, or change in bowel habits Skin: Denies abnormal skin rashes Lymphatics: Denies new lymphadenopathy or easy bruising Neurological:Denies numbness, tingling or new weaknesses Behavioral/Psych: Mood is stable, no new changes  All other systems were reviewed with the patient and are negative.  PHYSICAL EXAMINATION: ECOG PERFORMANCE STATUS: 0 - Asymptomatic  Vitals:   07/03/23 1240  BP: (!) 182/89  Pulse: (!) 55  Resp: 16  Temp: 97.8 F (36.6 C)  SpO2: 98%   Filed Weights   07/03/23 1240  Weight: 169 lb 9.6 oz (76.9 kg)    GENERAL:alert, no distress and comfortable SKIN: no rashes or significant lesions EYES: sclera clear LYMPH:  no palpable cervical or supraclavicular lymphadenopathy  LUNGS: clear with normal breathing effort HEART: regular rate & rhythm, no lower extremity edema ABDOMEN:abdomen soft, non-tender and normal bowel sounds Musculoskeletal:no cyanosis of digits and no clubbing  PSYCH: alert & oriented x 3 with fluent speech NEURO: no focal motor/sensory deficits   LABORATORY DATA:  I have reviewed the data as listed    Latest Ref Rng & Units  06/28/2023    3:41 AM 06/27/2023    3:46 AM 06/26/2023    1:52 PM  CBC  WBC 4.0 - 10.5 K/uL 6.5  9.8  11.4   Hemoglobin 12.0 - 15.0 g/dL 88.8  88.7  87.6   Hematocrit 36.0 - 46.0 % 34.6  34.1  38.0   Platelets 150 - 400 K/uL 229  222  255       Latest Ref Rng & Units 06/28/2023    3:41 AM 06/27/2023    9:07 AM 06/27/2023    3:46 AM  CMP  Glucose 70 - 99 mg/dL 724   692   BUN 8 - 23 mg/dL 18   18   Creatinine 9.55 - 1.00 mg/dL 8.82   9.12   Sodium 864 - 145 mmol/L 137   139   Potassium 3.5 - 5.1 mmol/L 4.0   4.0   Chloride 98 - 111 mmol/L 106   108   CO2 22 - 32 mmol/L 22   22   Calcium  8.9 - 10.3 mg/dL 8.9   9.2   Total Protein 6.5 - 8.1 g/dL 7.0  7.0    Total Bilirubin 0.0 - 1.2 mg/dL 1.9  2.5    Alkaline Phos 38 - 126 U/L 878  804    AST 15 - 41 U/L 357  441    ALT 0 - 44 U/L 453  344       RADIOGRAPHIC STUDIES: I have personally reviewed the radiological images as listed and agreed with the findings in the report. DG ERCP Result Date: 06/28/2023 CLINICAL DATA:  Elective surgery Fever Acalculous cholecystitis EXAM: ERCP TECHNIQUE: Multiple spot images obtained with the fluoroscopic device and submitted for interpretation post-procedure. FLUOROSCOPY: Radiation Exposure Index (as provided by the fluoroscopic device): 23.2 mGy Kerma COMPARISON:  06/14/2023 FINDINGS: Three intraoperative fluoroscopic images were provided for interpretation. Pancreatic duct stent is again seen. Last image demonstrates endoscope in place with a guidewire extending medially in the right upper quadrant. The exact position of the guidewire is not certain. The pancreatic duct stent appears more inferiorly positioned. IMPRESSION: Intraoperative fluoroscopic images of ERCP as above. These images were submitted for radiologic interpretation only. Please see the procedural report for the amount of contrast and the fluoroscopy time utilized. Electronically Signed   By: Aliene Lloyd M.D.   On: 06/28/2023 09:02   DG C-Arm  1-60 Min-No Report Result Date: 06/27/2023 Fluoroscopy was utilized by the requesting physician.  No radiographic interpretation.   DG Chest 2 View Result Date: 06/26/2023 CLINICAL DATA:  [id number] Fever [id number] EXAM: CHEST - 2 VIEW COMPARISON:  Chest x-ray 12/28/2009 FINDINGS: Enlarged cardiac silhouette. Otherwise the heart and mediastinal contours are within normal limits. No focal consolidation. No pulmonary edema. No pleural effusion. No pneumothorax. No acute osseous abnormality.  Right chest wall surgical clips. IMPRESSION: Mild cardiomegaly with no active cardiopulmonary disease. Electronically Signed   By: Morgane  Naveau M.D.   On: 06/26/2023 22:48   DG ERCP Result Date: 06/14/2023 CLINICAL DATA:  Acalculous cholecystitis; probable malignant biliary stricture EXAM: ERCP TECHNIQUE: Multiple spot images obtained with the fluoroscopic device and submitted for interpretation post-procedure. FLUOROSCOPY: Radiation Exposure Index (as provided by the fluoroscopic device): 84.7 MGy Kerma COMPARISON:  MRCP 06/14/2023 FINDINGS: A total of 9 intraoperative spot images are submitted for review. The images demonstrate cannulation of the pancreatic duct with balloon occluded ductogram. There is  diffuse pancreatic ductal dilatation. The final images demonstrate placement of a temporary plastic pancreatic duct stent. IMPRESSION: 1. Diffuse pancreatic ductal dilatation. 2. Placement of a plastic pancreatic duct stent. These images were submitted for radiologic interpretation only. Please see the procedural report for the amount of contrast and the fluoroscopy time utilized. Electronically Signed   By: Wilkie Lent M.D.   On: 06/14/2023 19:52   MR ABDOMEN MRCP W WO CONTAST Result Date: 06/14/2023 CLINICAL DATA:  Abdominal pain pancreatic mass suspected by prior CT EXAM: MRI ABDOMEN WITHOUT AND WITH CONTRAST (INCLUDING MRCP) TECHNIQUE: Multiplanar multisequence MR imaging of the abdomen was performed both before  and after the administration of intravenous contrast. Heavily T2-weighted images of the biliary and pancreatic ducts were obtained, and three-dimensional MRCP images were rendered by post processing. CONTRAST:  7mL GADAVIST  GADOBUTROL  1 MMOL/ML IV SOLN COMPARISON:  CT abdomen pelvis, 06/13/2023 FINDINGS: Lower chest: No acute abnormality.  Cardiomegaly. Hepatobiliary: No solid liver abnormality is seen. Status post cholecystectomy. Severe intra and extrahepatic biliary ductal dilatation the common bile duct effaced within the superior pancreatic head and the extrahepatic duct measuring up to 1.7 cm in caliber. Pancreas: Vague, masslike fullness of the central pancreatic head measuring 2.5 x 2.0 cm (series 16, image 59). Minimal underlying diffusion restriction and hypoenhancement. Severe pancreatic ductal dilatation distally, measuring up to 1.3 cm in caliber, with mild pancreatic parenchymal atrophy. Spleen: Normal in size without significant abnormality. Adrenals/Urinary Tract: Adrenal glands are unremarkable. Low-lying, under rotated right kidney, incompletely imaged. Left kidney is normal, without obvious renal calculi, solid lesion, or hydronephrosis. Stomach/Bowel: Stomach is within normal limits. No evidence of bowel wall thickening, distention, or inflammatory changes. Vascular/Lymphatic: Aortic atherosclerosis. The portal confluence and central superior mesenteric vein appear to be encased and partially effaced in the vicinity of suspected pancreatic head mass (series 2, image 44). Fat planes to the celiac axis and superior mesenteric artery are preserved. No enlarged abdominal lymph nodes. Other: No abdominal wall hernia or abnormality. No ascites. Musculoskeletal: No acute or significant osseous findings. IMPRESSION: 1. Vague, masslike fullness of the central pancreatic head measuring 2.5 x 2.0 cm. Minimal underlying diffusion restriction and hypoenhancement. Severe pancreatic ductal dilatation distally,  measuring up to 1.3 cm in caliber, with mild pancreatic parenchymal atrophy. Findings are highly concerning for pancreatic adenocarcinoma. 2. Severe intra and extrahepatic biliary ductal dilatation the common bile duct effaced within the superior pancreatic head and the extrahepatic duct measuring up to 1.7 cm in caliber. 3. The portal confluence and central superior mesenteric vein appear to be encased and partially effaced in the vicinity of suspected pancreatic head mass. 4. Status post cholecystectomy. 5. No evidence of lymphadenopathy or metastatic disease in the abdomen. 6. Cardiomegaly. Aortic Atherosclerosis (ICD10-I70.0). Electronically Signed   By: Marolyn JONETTA Jaksch M.D.   On: 06/14/2023 10:18   MR 3D Recon At Scanner Result Date: 06/14/2023 CLINICAL DATA:  Abdominal pain pancreatic mass suspected by prior CT EXAM: MRI ABDOMEN WITHOUT AND WITH CONTRAST (INCLUDING MRCP) TECHNIQUE: Multiplanar multisequence MR imaging of the abdomen was performed both before and after the administration of intravenous contrast. Heavily T2-weighted images of the biliary and pancreatic ducts were obtained, and three-dimensional MRCP images were rendered by post processing. CONTRAST:  7mL GADAVIST  GADOBUTROL  1 MMOL/ML IV SOLN COMPARISON:  CT abdomen pelvis, 06/13/2023 FINDINGS: Lower chest: No acute abnormality.  Cardiomegaly. Hepatobiliary: No solid liver abnormality is seen. Status post cholecystectomy. Severe intra and extrahepatic biliary ductal dilatation the common bile duct effaced within the  superior pancreatic head and the extrahepatic duct measuring up to 1.7 cm in caliber. Pancreas: Vague, masslike fullness of the central pancreatic head measuring 2.5 x 2.0 cm (series 16, image 59). Minimal underlying diffusion restriction and hypoenhancement. Severe pancreatic ductal dilatation distally, measuring up to 1.3 cm in caliber, with mild pancreatic parenchymal atrophy. Spleen: Normal in size without significant  abnormality. Adrenals/Urinary Tract: Adrenal glands are unremarkable. Low-lying, under rotated right kidney, incompletely imaged. Left kidney is normal, without obvious renal calculi, solid lesion, or hydronephrosis. Stomach/Bowel: Stomach is within normal limits. No evidence of bowel wall thickening, distention, or inflammatory changes. Vascular/Lymphatic: Aortic atherosclerosis. The portal confluence and central superior mesenteric vein appear to be encased and partially effaced in the vicinity of suspected pancreatic head mass (series 2, image 44). Fat planes to the celiac axis and superior mesenteric artery are preserved. No enlarged abdominal lymph nodes. Other: No abdominal wall hernia or abnormality. No ascites. Musculoskeletal: No acute or significant osseous findings. IMPRESSION: 1. Vague, masslike fullness of the central pancreatic head measuring 2.5 x 2.0 cm. Minimal underlying diffusion restriction and hypoenhancement. Severe pancreatic ductal dilatation distally, measuring up to 1.3 cm in caliber, with mild pancreatic parenchymal atrophy. Findings are highly concerning for pancreatic adenocarcinoma. 2. Severe intra and extrahepatic biliary ductal dilatation the common bile duct effaced within the superior pancreatic head and the extrahepatic duct measuring up to 1.7 cm in caliber. 3. The portal confluence and central superior mesenteric vein appear to be encased and partially effaced in the vicinity of suspected pancreatic head mass. 4. Status post cholecystectomy. 5. No evidence of lymphadenopathy or metastatic disease in the abdomen. 6. Cardiomegaly. Aortic Atherosclerosis (ICD10-I70.0). Electronically Signed   By: Marolyn JONETTA Jaksch M.D.   On: 06/14/2023 10:18   CT ABDOMEN PELVIS W CONTRAST Result Date: 06/13/2023 CLINICAL DATA:  Abdominal pain, acute, nonlocalized. Left lower side abdominal pain and brown urine since Sunday. EXAM: CT ABDOMEN AND PELVIS WITH CONTRAST TECHNIQUE: Multidetector CT  imaging of the abdomen and pelvis was performed using the standard protocol following bolus administration of intravenous contrast. RADIATION DOSE REDUCTION: This exam was performed according to the departmental dose-optimization program which includes automated exposure control, adjustment of the mA and/or kV according to patient size and/or use of iterative reconstruction technique. CONTRAST:  OMNIPAQUE  IOHEXOL  300 MG/ML  SOLN COMPARISON:  11/13/2016. FINDINGS: Lower chest: Heart is mildly enlarged. Mild atelectasis or scarring is present at the lung bases. Hepatobiliary: No focal liver abnormality is seen. Status post cholecystectomy. Intrahepatic and extrahepatic biliary ductal dilatation are noted. The common bile duct measures up to 1.6 cm. Pancreas: There is an ill-defined hypodense region in the head of the pancreas measuring 2.9 x 2.2 cm. There is dilatation of the pancreatic duct up to 1.2 cm distal to this region. No surrounding inflammatory changes are seen. Spleen: Normal in size without focal abnormality. Adrenals/Urinary Tract: The adrenal glands are within normal limits. The kidneys enhance symmetrically. 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 The right kidney is pelvic in location. No renal calculus or hydronephrosis is seen. The visualized portion of the urinary bladder is within normal limits. The bladder is not well seen due to streak hardware artifact. Stomach/Bowel: A small hiatal hernia is noted. Stomach is within normal limits. Appendix is not seen. No evidence of bowel wall thickening, distention, or inflammatory changes. No free air or pneumatosis is seen. A right inguinal hernia is noted containing nonobstructed small bowel. Vascular/Lymphatic: Aortic atherosclerosis. No enlarged abdominal or pelvic lymph nodes.  Reproductive: Status post hysterectomy. No adnexal masses. Other: No abdominopelvic ascites. Musculoskeletal: Total hip arthroplasty changes are noted bilaterally.  Degenerative changes are present in the thoracolumbar spine. No acute osseous abnormality is seen. IMPRESSION: 1. No acute process to explain reported left lower quadrant abdominal pain. 2. Vague hypodense region in the head of the pancreas with biliary and pancreatic ductal dilatation, concerning for possible pancreatic adenocarcinoma. MRI with pancreatic protocol and MRCP are recommended for further evaluation. 3. Small hiatal hernia. 4. Right inguinal hernia containing nonobstructed small bowel. 5. Aortic atherosclerosis. Electronically Signed   By: Leita Birmingham M.D.   On: 06/13/2023 21:50    ASSESSMENT & PLAN:  78 year old female   Pancreatic mass  -We reviewed her medical record in detail with the patient and family -She presented with painless jaundice and intentional weight loss, workup showed pancreatic head mass with double duct sign, concerning for malignancy -S/p pancreatic duct stenting, but failed bile duct stenting x 2 -Baseline CA 19-9 was normal -We reviewed her workup thus far, recovered from hospitalization and is doing well overall, asymptomatic  -Discussed the image findings are concerning for malignancy, including neuroendocrine tumor of the pancreas versus adenocarcinoma -We recommend PET scan to further differentiate if this is a benign versus malignant process -Lab today, including chromogranin A tumor marker for PNET -She is scheduled with GI tomorrow to discuss EUS, we recommend to proceed with that for tissue diagnosis.  -If she has worsened transaminitis on today's labs will discuss possible PTC with IR -Will review her case in conference -Pt agrees with the plan -She did not want to go into treatment detail until we have a definitive diagnosis, which is very reasonable -F/up after work up is complete -Pt seen with Dr. Lanny  DCIS, right breast, stage 0, ER/PR+, G2 -Diagnosed 01/2019, s/p right lumpectomy on 03/18/2019, due to positive margin she underwent reexcision  04/08/2019 and mastectomy on 07/26/2019 -Final path showed DCIS, no invasive cancer, did not need postmastectomy radiation and declined adjuvant antiestrogen -Screening left mammogram 03/06/2023 was benign -If she is found to have pancreatic adeno, we would recommend genetic testing to r/o BRCA mutation, if + she would be a candidate for parp inhibitor    PLAN: -Medical record reviewed -Lab today (CBC, CMP, chromogranin A) -Possible IR consult for PTC if she has worsening transaminitis  -PET scan in the next week -Discuss in tumor board -Scheduled with GI 1/10, encouraged pt to proceed with EUS for diagnosis -F/up after work up is complete -Pt seen with Dr. Lanny -Message to care team   Orders Placed This Encounter  Procedures   NM PET Image Initial (PI) Skull Base To Thigh    Standing Status:   Future    Expected Date:   07/10/2023    Expiration Date:   07/02/2024    If indicated for the ordered procedure, I authorize the administration of a radiopharmaceutical per Radiology protocol:   Yes    Preferred imaging location?:   [facility]   CBC with Differential (Cancer Center Only)    Standing Status:   Future    Number of Occurrences:   1    Expiration Date:   07/02/2024   CMP (Cancer Center only)    Standing Status:   Future    Number of Occurrences:   1    Expiration Date:   07/02/2024   Chromogranin A    Standing Status:   Future    Number of Occurrences:   1  Expiration Date:   07/02/2024    All questions were answered. The patient knows to call the clinic with any problems, questions or concerns.      Jakeem Grape K Shawne Eskelson, NP 07/03/2023    Addendum I have seen the patient, examined her. I agree with the assessment and and plan and have edited the notes.    78 yo female with PMH of hypertension, OSA, breast DCIS, presented with obstructive jaundice, CT and abdominal MRI showed a hypodense lesion in the head of the pancreas, highly suspicious for malignancy.  She has had ERCP twice  but biliary stent placement has not been successful.  I reviewed her labs and images with patient and her daughter, and discussed the high possibility of malignancy, especially pancreatic cancer.  Her tumor marker CA 19.9 was unremarkable, will repeat her CBC, CMP and obtain chromogranin A today.  Will reach out to her GI, to see if Dr. Wilhelmenia can do EUS biopsy of the pancreatic mass.  I will also obtain a PET scan imaging for further evaluation, since she has not had any tissue diagnosis.  Her labs came back after her clinic visit today, which showed worsening hyperbilirubinemia with total bilirubin 6.5, increased from 1.9 6 days ago.  Case was discussed with [facility], Dr. Wilhelmenia reviewed her case, and is willing to try ERCP again, and EUS for tissue diagnosis early next week.  If ERCP fails, then we will ask IR to do percutaneous biliary draining.  I called patient after her visit, she agrees with the plan.  Will see her back next Monday for hospital admission.  She knows to go to the emergency room if she develops any fever, worsening right upper abdominal pain or other new symptoms.  I spent a total of 60 minutes for her visit today, more than 50% time on face-to-face counseling.  Onita Mattock MD 07/02/2022

## 2023-07-04 ENCOUNTER — Ambulatory Visit: Payer: PPO | Admitting: Nurse Practitioner

## 2023-07-04 ENCOUNTER — Encounter: Payer: Self-pay | Admitting: Cardiology

## 2023-07-04 NOTE — Progress Notes (Signed)
 Per Secure Chat from Dr. Lanny & Dr. Luverne regarding hospital admission for [facility] procedure.  Norleen Spain, CMA spoke with pt to inform pt that Dr. Lanny would like to see the pt in clinic 07/07/2023 morning and be admitted to [facility] hospital afterwards so Dr. Luverne can do the pt's PTC.  Pt verbalized understanding and confirmed appt with Norleen Spain, CMA.  This nurse will contact the Hospitalist Pager on 07/07/2023 to have the pt admitted to [facility].

## 2023-07-07 ENCOUNTER — Inpatient Hospital Stay: Payer: PPO

## 2023-07-07 ENCOUNTER — Inpatient Hospital Stay: Payer: PPO | Admitting: Hematology

## 2023-07-07 ENCOUNTER — Other Ambulatory Visit: Payer: Self-pay

## 2023-07-07 ENCOUNTER — Encounter (HOSPITAL_COMMUNITY): Payer: Self-pay

## 2023-07-07 ENCOUNTER — Telehealth: Payer: Self-pay

## 2023-07-07 ENCOUNTER — Inpatient Hospital Stay (HOSPITAL_COMMUNITY)
Admission: RE | Admit: 2023-07-07 | Discharge: 2023-07-09 | DRG: 435 | Disposition: A | Discharge status: Home / Self Care | Payer: PPO | Source: Ambulatory Visit | Attending: Internal Medicine | Admitting: Internal Medicine

## 2023-07-07 ENCOUNTER — Encounter (HOSPITAL_COMMUNITY): Payer: Self-pay | Admitting: Internal Medicine

## 2023-07-07 ENCOUNTER — Encounter: Payer: Self-pay | Admitting: Hematology

## 2023-07-07 VITALS — BP 189/85 | HR 63 | Temp 97.5°F | Resp 17 | Wt 168.4 lb

## 2023-07-07 DIAGNOSIS — K2990 Gastroduodenitis, unspecified, without bleeding: Secondary | ICD-10-CM | POA: Diagnosis present

## 2023-07-07 DIAGNOSIS — E1169 Type 2 diabetes mellitus with other specified complication: Secondary | ICD-10-CM | POA: Diagnosis present

## 2023-07-07 DIAGNOSIS — K838 Other specified diseases of biliary tract: Secondary | ICD-10-CM | POA: Diagnosis not present

## 2023-07-07 DIAGNOSIS — K3189 Other diseases of stomach and duodenum: Secondary | ICD-10-CM | POA: Diagnosis present

## 2023-07-07 DIAGNOSIS — K449 Diaphragmatic hernia without obstruction or gangrene: Secondary | ICD-10-CM | POA: Diagnosis not present

## 2023-07-07 DIAGNOSIS — E785 Hyperlipidemia, unspecified: Secondary | ICD-10-CM | POA: Diagnosis present

## 2023-07-07 DIAGNOSIS — K8689 Other specified diseases of pancreas: Secondary | ICD-10-CM

## 2023-07-07 DIAGNOSIS — C250 Malignant neoplasm of head of pancreas: Secondary | ICD-10-CM | POA: Diagnosis not present

## 2023-07-07 DIAGNOSIS — Z853 Personal history of malignant neoplasm of breast: Secondary | ICD-10-CM

## 2023-07-07 DIAGNOSIS — I10 Essential (primary) hypertension: Secondary | ICD-10-CM | POA: Diagnosis present

## 2023-07-07 DIAGNOSIS — R748 Abnormal levels of other serum enzymes: Secondary | ICD-10-CM | POA: Diagnosis present

## 2023-07-07 DIAGNOSIS — H409 Unspecified glaucoma: Secondary | ICD-10-CM | POA: Diagnosis present

## 2023-07-07 DIAGNOSIS — E039 Hypothyroidism, unspecified: Secondary | ICD-10-CM | POA: Diagnosis present

## 2023-07-07 DIAGNOSIS — Z96643 Presence of artificial hip joint, bilateral: Secondary | ICD-10-CM | POA: Diagnosis present

## 2023-07-07 DIAGNOSIS — Z87891 Personal history of nicotine dependence: Secondary | ICD-10-CM | POA: Diagnosis not present

## 2023-07-07 DIAGNOSIS — I899 Noninfective disorder of lymphatic vessels and lymph nodes, unspecified: Secondary | ICD-10-CM | POA: Diagnosis not present

## 2023-07-07 DIAGNOSIS — Z833 Family history of diabetes mellitus: Secondary | ICD-10-CM

## 2023-07-07 DIAGNOSIS — J849 Interstitial pulmonary disease, unspecified: Secondary | ICD-10-CM | POA: Diagnosis not present

## 2023-07-07 DIAGNOSIS — K219 Gastro-esophageal reflux disease without esophagitis: Secondary | ICD-10-CM | POA: Diagnosis not present

## 2023-07-07 DIAGNOSIS — K2289 Other specified disease of esophagus: Secondary | ICD-10-CM | POA: Diagnosis not present

## 2023-07-07 DIAGNOSIS — C240 Malignant neoplasm of extrahepatic bile duct: Secondary | ICD-10-CM | POA: Diagnosis not present

## 2023-07-07 DIAGNOSIS — E876 Hypokalemia: Secondary | ICD-10-CM | POA: Diagnosis not present

## 2023-07-07 DIAGNOSIS — Z4682 Encounter for fitting and adjustment of non-vascular catheter: Secondary | ICD-10-CM | POA: Diagnosis not present

## 2023-07-07 DIAGNOSIS — Z8249 Family history of ischemic heart disease and other diseases of the circulatory system: Secondary | ICD-10-CM | POA: Diagnosis not present

## 2023-07-07 DIAGNOSIS — K222 Esophageal obstruction: Secondary | ICD-10-CM | POA: Diagnosis present

## 2023-07-07 DIAGNOSIS — Z794 Long term (current) use of insulin: Secondary | ICD-10-CM

## 2023-07-07 DIAGNOSIS — I428 Other cardiomyopathies: Secondary | ICD-10-CM | POA: Diagnosis present

## 2023-07-07 DIAGNOSIS — K2970 Gastritis, unspecified, without bleeding: Secondary | ICD-10-CM | POA: Diagnosis not present

## 2023-07-07 DIAGNOSIS — I110 Hypertensive heart disease with heart failure: Secondary | ICD-10-CM | POA: Diagnosis present

## 2023-07-07 DIAGNOSIS — I5022 Chronic systolic (congestive) heart failure: Secondary | ICD-10-CM | POA: Diagnosis not present

## 2023-07-07 DIAGNOSIS — K831 Obstruction of bile duct: Principal | ICD-10-CM | POA: Diagnosis present

## 2023-07-07 DIAGNOSIS — K31A11 Gastric intestinal metaplasia without dysplasia, involving the antrum: Secondary | ICD-10-CM | POA: Diagnosis not present

## 2023-07-07 DIAGNOSIS — D72829 Elevated white blood cell count, unspecified: Secondary | ICD-10-CM | POA: Diagnosis not present

## 2023-07-07 DIAGNOSIS — R7989 Other specified abnormal findings of blood chemistry: Secondary | ICD-10-CM | POA: Diagnosis present

## 2023-07-07 DIAGNOSIS — Z9049 Acquired absence of other specified parts of digestive tract: Secondary | ICD-10-CM

## 2023-07-07 DIAGNOSIS — B9681 Helicobacter pylori [H. pylori] as the cause of diseases classified elsewhere: Secondary | ICD-10-CM | POA: Diagnosis not present

## 2023-07-07 DIAGNOSIS — Z90710 Acquired absence of both cervix and uterus: Secondary | ICD-10-CM

## 2023-07-07 DIAGNOSIS — Z803 Family history of malignant neoplasm of breast: Secondary | ICD-10-CM

## 2023-07-07 DIAGNOSIS — Z9011 Acquired absence of right breast and nipple: Secondary | ICD-10-CM

## 2023-07-07 DIAGNOSIS — N179 Acute kidney failure, unspecified: Secondary | ICD-10-CM | POA: Diagnosis present

## 2023-07-07 DIAGNOSIS — C249 Malignant neoplasm of biliary tract, unspecified: Secondary | ICD-10-CM | POA: Diagnosis not present

## 2023-07-07 DIAGNOSIS — R7401 Elevation of levels of liver transaminase levels: Secondary | ICD-10-CM | POA: Diagnosis present

## 2023-07-07 DIAGNOSIS — C259 Malignant neoplasm of pancreas, unspecified: Secondary | ICD-10-CM | POA: Diagnosis present

## 2023-07-07 DIAGNOSIS — G4733 Obstructive sleep apnea (adult) (pediatric): Secondary | ICD-10-CM | POA: Diagnosis not present

## 2023-07-07 DIAGNOSIS — E119 Type 2 diabetes mellitus without complications: Secondary | ICD-10-CM | POA: Diagnosis not present

## 2023-07-07 DIAGNOSIS — K2950 Unspecified chronic gastritis without bleeding: Secondary | ICD-10-CM

## 2023-07-07 LAB — CBC WITH DIFFERENTIAL/PLATELET
Abs Immature Granulocytes: 0.02 10*3/uL (ref 0.00–0.07)
Basophils Absolute: 0 10*3/uL (ref 0.0–0.1)
Basophils Relative: 0 %
Eosinophils Absolute: 0 10*3/uL (ref 0.0–0.5)
Eosinophils Relative: 0 %
HCT: 35.8 % — ABNORMAL LOW (ref 36.0–46.0)
Hemoglobin: 12.2 g/dL (ref 12.0–15.0)
Immature Granulocytes: 0 %
Lymphocytes Relative: 24 %
Lymphs Abs: 1.6 10*3/uL (ref 0.7–4.0)
MCH: 30.7 pg (ref 26.0–34.0)
MCHC: 34.1 g/dL (ref 30.0–36.0)
MCV: 89.9 fL (ref 80.0–100.0)
Monocytes Absolute: 0.4 10*3/uL (ref 0.1–1.0)
Monocytes Relative: 7 %
Neutro Abs: 4.5 10*3/uL (ref 1.7–7.7)
Neutrophils Relative %: 69 %
Platelets: 264 10*3/uL (ref 150–400)
RBC: 3.98 MIL/uL (ref 3.87–5.11)
RDW: 15.2 % (ref 11.5–15.5)
WBC: 6.5 10*3/uL (ref 4.0–10.5)
nRBC: 0 % (ref 0.0–0.2)

## 2023-07-07 LAB — COMPREHENSIVE METABOLIC PANEL
ALT: 396 U/L (ref 0–44)
AST: 273 U/L (ref 15–41)
Albumin: 3.8 g/dL (ref 3.5–5.0)
Alkaline Phosphatase: 1123 U/L — ABNORMAL HIGH (ref 38–126)
Anion gap: 8 (ref 5–15)
BUN: 16 mg/dL (ref 8–23)
CO2: 26 mmol/L (ref 22–32)
Calcium: 9.6 mg/dL (ref 8.9–10.3)
Chloride: 107 mmol/L (ref 98–111)
Creatinine, Ser: 1.33 mg/dL — ABNORMAL HIGH (ref 0.44–1.00)
GFR, Estimated: 42 mL/min — ABNORMAL LOW (ref >60)
Glucose, Bld: 137 mg/dL — ABNORMAL HIGH (ref 70–99)
Potassium: 3.7 mmol/L (ref 3.5–5.1)
Sodium: 141 mmol/L (ref 135–145)
Total Bilirubin: 8.6 mg/dL (ref 0.0–1.2)
Total Protein: 7.1 g/dL (ref 6.5–8.1)

## 2023-07-07 LAB — GLUCOSE, CAPILLARY
Glucose-Capillary: 167 mg/dL — ABNORMAL HIGH (ref 70–99)
Glucose-Capillary: 167 mg/dL — ABNORMAL HIGH (ref 70–99)

## 2023-07-07 LAB — PROTIME-INR
INR: 0.9 (ref 0.8–1.2)
Prothrombin Time: 12.6 s (ref 11.4–15.2)

## 2023-07-07 MED ORDER — INSULIN GLARGINE-YFGN 100 UNIT/ML ~~LOC~~ SOLN
12.0000 [IU] | Freq: Every day | SUBCUTANEOUS | Status: DC
  Administered 2023-07-07 – 2023-07-08 (×2): 12 [IU] via SUBCUTANEOUS
  Filled 2023-07-07 (×3): qty 0.12

## 2023-07-07 MED ORDER — SODIUM CHLORIDE 0.45 % IV SOLN: INTRAVENOUS | Status: DC

## 2023-07-07 MED ORDER — ACETAMINOPHEN 650 MG RE SUPP: 650.0000 mg | Freq: Four times a day (QID) | RECTAL | Status: DC | PRN

## 2023-07-07 MED ORDER — ONDANSETRON HCL 4 MG PO TABS: 4.0000 mg | ORAL_TABLET | Freq: Four times a day (QID) | ORAL | Status: DC | PRN

## 2023-07-07 MED ORDER — THYROID 60 MG PO TABS
90.0000 mg | ORAL_TABLET | Freq: Every day | ORAL | Status: DC
  Administered 2023-07-09: 90 mg via ORAL
  Filled 2023-07-07 (×2): qty 1

## 2023-07-07 MED ORDER — HYDRALAZINE HCL 50 MG PO TABS
50.0000 mg | ORAL_TABLET | Freq: Four times a day (QID) | ORAL | Status: DC | PRN
  Administered 2023-07-07 – 2023-07-08 (×2): 50 mg via ORAL
  Filled 2023-07-07 (×2): qty 1

## 2023-07-07 MED ORDER — ONDANSETRON HCL 4 MG/2ML IJ SOLN
4.0000 mg | Freq: Four times a day (QID) | INTRAMUSCULAR | Status: DC | PRN
Start: 2023-07-07 — End: 2023-07-09
  Administered 2023-07-08: 4 mg via INTRAVENOUS
  Filled 2023-07-07: qty 2

## 2023-07-07 MED ORDER — SODIUM CHLORIDE 0.45 % IV SOLN: INTRAVENOUS | Status: AC

## 2023-07-07 MED ORDER — ACETAMINOPHEN 325 MG PO TABS: 650.0000 mg | ORAL_TABLET | Freq: Four times a day (QID) | ORAL | Status: DC | PRN

## 2023-07-07 MED ORDER — INSULIN ASPART 100 UNIT/ML IJ SOLN
0.0000 [IU] | Freq: Three times a day (TID) | INTRAMUSCULAR | Status: DC
Start: 2023-07-07 — End: 2023-07-09
  Administered 2023-07-07 – 2023-07-09 (×3): 3 [IU] via SUBCUTANEOUS
  Administered 2023-07-09: 2 [IU] via SUBCUTANEOUS

## 2023-07-07 MED ORDER — HYDROXYZINE HCL 25 MG PO TABS: 25.0000 mg | ORAL_TABLET | Freq: Four times a day (QID) | ORAL | Status: DC | PRN

## 2023-07-07 MED ORDER — FAMOTIDINE 20 MG PO TABS
20.0000 mg | ORAL_TABLET | Freq: Every day | ORAL | Status: DC
  Administered 2023-07-07 – 2023-07-09 (×2): 20 mg via ORAL
  Filled 2023-07-07 (×2): qty 1

## 2023-07-07 NOTE — Consult Note (Signed)
 Consultation  Referring Provider: Dr. Celinda    Primary Care Physician:  [name], MD Primary Gastroenterologist: Dr. Shila       Reason for Consultation:   Jaundice/pancreatic mass         HPI:   Adrienne [name] is a 78 y.o. female with a past medical history as listed below significant for DCIS, hypertension, hypothyroidism and nonischemic cardiomyopathy as well as type 2 diabetes, who presents to the hospital today for elevated LFTs in the setting of pancreatic mass and jaundice and known biliary obstruction.    Admission 06/13/2023-06/16/2023 with obstructive jaundice, mass in the head of the pancreas, pancreatic and biliary dilation.  Status post EGD/ERCP 12/21 with Dr. Vona to cannulate bile duct ERCP-pancreatic duct stent placed with improvement in bilirubin.  Also therapeutic dilation of the proximal esophageal stenosis with 40 French Maloney and H. pylori gastritis.  Discharged 12/23 with improvement in total bili to 1.8, baby showed passing of the pancreatic stent, due to decrease in bilirubin IR did not proceed with PBD.    06/24/2023 labs at PCP showed stable total bili of 1.8, exponential increase in alk phos to 1379, AST and ALT increasing with AST 266, ALT 561.  Instructed to proceed to the emergency department for further eval.    06/26/2023-06/27/2023 patient followed by our service in the hospital.  At that point AST 72, ALT 296, alk phos 821, total bili 1.6, leukocytosis with WBC 11.4, lipase 58.  She was feeling well.  Repeat ERCP 06/27/2023 was unsuccessful, patient continued on Cipro  and advance diet, recommended EUS.    07/03/2023 CMP with a creatinine of 1.12, alk phos 1310, AST 492, ALT 585 and total bili 6.5.    07/07/2023 CMP with a creatinine of 1.33, alk phos 1123, AST 273, ALT 396, total bili 8.6.    Today, the patient tells me she is actually feeling fine.  She was admitted given increasing bilirubin.  Aware of plans for EUS tomorrow.  Denies any  new complaints or concerns, no abdominal pain, normal bowel habits, tolerating her diet, no nausea or vomiting.  She is jaundiced and has been for a while.    Denies fever or chills.  Previous GI workup: ERCP 07/24/2023 -Failed cholangiogram (this was second attempt at ERCP) -Pancreatic mass on MRCP with double duct sign  EGD/ERCP 06/14/23 - The examination was suspicious for a pancreatic tumor in the head of the pancreas. Pancreatic duct cannulated and partially filled - very dilated - underfilled by intent - given multiple wire passages and contrast injection a 4 Fr 3 cm no flap singel pigtail stent was placed. - Attempts at a cholangiogram failed. - Esophageal stenosis. Dilated 40 fr Maloney w/ mild mucosal disruption - she had been c/o mild dysphagia and had seen ENT - Mucosal changes suspicious for gastritis. Biopsied. H pylori positive.   MRCP/MR 06/14/23 IMPRESSION: 1. Vague, masslike fullness of the central pancreatic head measuring 2.5 x 2.0 cm. Minimal underlying diffusion restriction and hypoenhancement. Severe pancreatic ductal dilatation distally, measuring up to 1.3 cm in caliber, with mild pancreatic parenchymal atrophy. Findings are highly concerning for pancreatic adenocarcinoma. 2. Severe intra and extrahepatic biliary ductal dilatation the common bile duct effaced within the superior pancreatic head and the extrahepatic duct measuring up to 1.7 cm in caliber. 3. The portal confluence and central superior mesenteric vein appear to be encased and partially effaced in the vicinity of suspected pancreatic head mass. 4. Status post cholecystectomy. 5. No  evidence of lymphadenopathy or metastatic disease in the abdomen. 6. Cardiomegaly.  Past Medical History:  Diagnosis Date  . Allergy   . Ambulates with cane    straight cane  . Arthritis    knee, back  . CHF (congestive heart failure) (HCC)   . Ductal carcinoma in situ of breast 12/2018   R Breast-mastectomy only   . Gout   . History of blood transfusion 1986   w/ Hysterectomy surgery  . Hyperlipidemia    diet controlled - no meds  . Hypertension   . Hypothyroidism   . Pelvic kidney    lower right pelvic kidney   . SBO (small bowel obstruction) (HCC) 10/2016   surgery   . Sleep apnea    Mild - no mask needed per sleep study  . Smoker    quit smoking 2018  . Type 2 diabetes mellitus (HCC)     Past Surgical History:  Procedure Laterality Date  . ABDOMINAL HYSTERECTOMY  1986   COMPLETE-precancerous  . APPENDECTOMY     pt states it was removed when gallbladder was removed.  . AUGMENTATION MAMMAPLASTY    . BACK SURGERY     lower back  . BIOPSY  06/14/2023   Procedure: BIOPSY;  Surgeon: Avram Lupita BRAVO, MD;  Location: [facility];  Service: Gastroenterology;;  . BREAST BIOPSY Right 05/12/2019   times 2  . BREAST LUMPECTOMY WITH RADIOACTIVE SEED LOCALIZATION Right 03/18/2019   Procedure: RIGHT BREAST LUMPECTOMY WITH RADIOACTIVE SEED LOCALIZATION;  Surgeon: [name], MD;  Location: [facility] OR;  Service: General;  Laterality: Right;  . CHOLECYSTECTOMY    . COLONOSCOPY  02/15/2008   Debrah   . COLONOSCOPY WITH PROPOFOL   02/27/2018   Dr.Nandigam  . ECTOPIC PREGNANCY SURGERY    . ERCP N/A 06/14/2023   Procedure: ENDOSCOPIC RETROGRADE CHOLANGIOPANCREATOGRAPHY (ERCP);  Surgeon: Avram Lupita BRAVO, MD;  Location: [facility];  Service: Gastroenterology;  Laterality: N/A;  . ERCP N/A 06/27/2023   Procedure: ENDOSCOPIC RETROGRADE CHOLANGIOPANCREATOGRAPHY (ERCP);  Surgeon: Charlanne Groom, MD;  Location: [facility];  Service: Gastroenterology;  Laterality: N/A;  . ESOPHAGOGASTRODUODENOSCOPY (EGD) WITH PROPOFOL  N/A 06/14/2023   Procedure: ESOPHAGOGASTRODUODENOSCOPY (EGD) WITH PROPOFOL ;  Surgeon: Avram Lupita BRAVO, MD;  Location: [facility];  Service: Gastroenterology;  Laterality: N/A;  . JOINT REPLACEMENT     Left hip total Dr. Melodi 10-01-17  . MALONEY DILATION  06/14/2023   Procedure: AGAPITO  DILATION;  Surgeon: Avram Lupita BRAVO, MD;  Location: [facility];  Service: Gastroenterology;;  . MASTECTOMY Right 07/26/2019  . PANCREATIC STENT PLACEMENT  06/14/2023   Procedure: PANCREATIC STENT PLACEMENT;  Surgeon: Avram Lupita BRAVO, MD;  Location: [facility];  Service: Gastroenterology;;  . PANCREATIC STENT PLACEMENT  06/27/2023   Procedure: PANCREATIC STENT PLACEMENT;  Surgeon: Charlanne Groom, MD;  Location: [facility];  Service: Gastroenterology;;  . POLYPECTOMY    . polypectomy-oropharynx    . RE-EXCISION OF BREAST CANCER,SUPERIOR MARGINS Right 04/08/2019   Procedure: RE-EXCISION OF RIGHT BREAST ANTERIOR MARGINS;  Surgeon: [name], MD;  Location: [facility];  Service: General;  Laterality: Right;  . right knee meniscus    . SBO  10/2016   small bowel obstruction  . SIMPLE MASTECTOMY WITH AXILLARY SENTINEL NODE BIOPSY Right 07/26/2019   Procedure: RIGHT MASTECTOMY WITH SENTINEL NODE BIOPSY;  Surgeon: [name], MD;  Location: [facility];  Service: General;  Laterality: Right;  . SPHINCTEROTOMY  06/27/2023   Procedure: SPHINCTEROTOMY;  Surgeon: Charlanne Groom, MD;  Location:  [facility];  Service: Gastroenterology;;  . TOTAL HIP ARTHROPLASTY Left 10/01/2017   Procedure: LEFT  TOTAL HIP ARTHROPLASTY ANTERIOR APPROACH;  Surgeon: Melodi Lerner, MD;  Location: [facility];  Service: Orthopedics;  Laterality: Left;  . TOTAL HIP ARTHROPLASTY Right 03/11/2018   Procedure: RIGHT TOTAL HIP ARTHROPLASTY ANTERIOR APPROACH;  Surgeon: Melodi Lerner, MD;  Location: [facility];  Service: Orthopedics;  Laterality: Right;   . UPPER GASTROINTESTINAL ENDOSCOPY      Family History  Problem Relation Age of Onset  . Cancer Sister   . Diabetes Brother   . Diabetes Brother   . Hypertension Other   . Breast cancer Sister   . Colon cancer Neg Hx   . Colon polyps Neg Hx   . Rectal cancer Neg Hx   . Stomach cancer Neg Hx     Social History   Tobacco Use  . Smoking status: Former     Current packs/day: 0.00    Average packs/day: 1 pack/day for 32.0 years (32.0 ttl pk-yrs)    Types: E-cigarettes, Cigarettes    Start date: 11/20/1984    Quit date: 11/20/2016    Years since quitting: 6.6  . Smokeless tobacco: Never  . Tobacco comments:    Smoker since 1975 has quit and started back several times    As of 07/03/23: smoked 30 years, 1 PPD, quit 2018  Vaping Use  . Vaping status: Former  Substance Use Topics  . Alcohol use: No  . Drug use: No    Prior to Admission medications   Medication Sig Start Date End Date Taking? Authorizing Provider  cetirizine  (ZYRTEC ) 10 MG tablet Take 1 tablet (10 mg total) by mouth daily. Patient taking differently: Take 10 mg by mouth daily as needed for allergies. 05/15/23   Soldatova, Liuba, MD  Cholecalciferol (VITAMIN D -3) 125 MCG (5000 UT) TABS Take 1 tablet by mouth daily.    [provider]  famotidine  (PEPCID ) 20 MG tablet Take 1 tablet (20 mg total) by mouth 2 (two) times daily. 05/15/23   Soldatova, Liuba, MD  fluticasone  (FLONASE ) 50 MCG/ACT nasal spray Place 2 sprays into both nostrils daily. 05/15/23   Soldatova, Liuba, MD  insulin  aspart (NOVOLOG ) 100 UNIT/ML FlexPen Inject 0-6 Units into the skin 3 (three) times daily with meals. Check Blood Glucose (BG) and inject per scale: BG <150= 0 unit; BG 150-200= 1 unit; BG 201-250= 2 unit; BG 251-300= 3 unit; BG 301-350= 4 unit; BG 351-400= 5 unit; BG >400= 6 unit and Call Primary Care. 06/28/23   Rai, Nydia POUR, MD  Insulin  Glargine (BASAGLAR  KWIKPEN) 100 UNIT/ML Inject 12 Units into the skin daily. May substitute as needed per insurance. 06/28/23   Rai, Nydia POUR, MD  losartan  (COZAAR ) 100 MG tablet Take 1 tablet by mouth once daily 05/05/23   Patel, Rutwik K, MD  thyroid  (NP THYROID ) 90 MG tablet Take 1 tablet (90 mg total) by mouth daily. 02/14/23   [name], MD    Current Facility-Administered Medications  Medication Dose Route Frequency Provider Last Rate Last Admin   . 0.45 % sodium chloride  infusion   Intravenous Continuous Celinda Alm Lot, MD      . hydrALAZINE  (APRESOLINE ) tablet 50 mg  50 mg Oral Q6H PRN Celinda Alm Lot, MD      . insulin  aspart (novoLOG ) injection 0-15 Units  0-15 Units Subcutaneous TID WC Celinda Alm Lot, MD      . ondansetron  (ZOFRAN ) tablet 4 mg  4 mg  Oral Q6H PRN Celinda Alm Lot, MD       Or  . ondansetron  (ZOFRAN ) injection 4 mg  4 mg Intravenous Q6H PRN Celinda Alm Lot, MD        Allergies as of 07/07/2023 - Review Complete 07/07/2023  Allergen Reaction Noted  . Lisinopril  Swelling 03/22/2016  . Statins  10/10/2022  . Atorvastatin  Other (See Comments) 09/12/2016     Review of Systems:    Constitutional: No weight loss, fever or chills Skin: No rash  Cardiovascular: No chest pain Respiratory: No SOB Gastrointestinal: See HPI and otherwise negative Genitourinary: No dysuria Neurological: No headache, dizziness or syncope Musculoskeletal: No new muscle or joint pain Hematologic: No bleeding  Psychiatric: No history of depression or anxiety    Physical Exam:  Vital signs in last 24 hours: Temp:  [97.5 F (36.4 C)-97.7 F (36.5 C)] 97.7 F (36.5 C) (01/13 1204) Pulse Rate:  [63] 63 (01/13 1204) Resp:  [17-18] 18 (01/13 1204) BP: (151-194)/(64-85) 151/64 (01/13 1204) SpO2:  [100 %] 100 % (01/13 0955) Weight:  [76.4 kg] 76.4 kg (01/13 1204)   General:   Pleasant AA female appears to be in NAD, Well developed, Well nourished, alert and cooperative +jaundice Head:  Normocephalic and atraumatic. Eyes:   PEERL, EOMI. +icterus. Conjunctiva pink. Ears:  Normal auditory acuity. Neck:  Supple Throat: Oral cavity and pharynx without inflammation, swelling or lesion. Teeth in good condition. Lungs: Respirations even and unlabored. Lungs clear to auscultation bilaterally.   No wheezes, crackles, or rhonchi.  Heart: Normal S1, S2. No MRG. Regular rate and rhythm. No peripheral edema, cyanosis or  pallor.  Abdomen:  Soft, nondistended, nontender. No rebound or guarding. Normal bowel sounds. No appreciable masses or hepatomegaly. Rectal:  Not performed.  Msk:  Symmetrical without gross deformities. Peripheral pulses intact.  Extremities:  Without edema, no deformity or joint abnormality. Normal ROM, normal sensation. Neurologic:  Alert and  oriented x4;  grossly normal neurologically. Skin:   Dry and intact without significant lesions or rashes. Psychiatric: Demonstrates good judgement and reason without abnormal affect or behaviors.   LAB RESULTS: Recent Labs    07/07/23 1015  WBC 6.5  HGB 12.2  HCT 35.8*  PLT 264   BMET Recent Labs    07/07/23 1015  NA 141  K 3.7  CL 107  CO2 26  GLUCOSE 137*  BUN 16  CREATININE 1.33*  CALCIUM  9.6      Latest Ref Rng & Units 07/07/2023   10:15 AM 07/03/2023    1:24 PM 06/28/2023    3:41 AM  Hepatic Function  Total Protein 6.5 - 8.1 g/dL 7.1  7.5  7.0   Albumin 3.5 - 5.0 g/dL 3.8  4.0  3.2   AST 15 - 41 U/L 273  492  357   ALT 0 - 44 U/L 396  585  453   Alk Phosphatase 38 - 126 U/L 1,123  1,310  878   Total Bilirubin 0.0 - 1.2 mg/dL 8.6  6.5  1.9      PT/INR Recent Labs    07/07/23 1015  LABPROT 12.6  INR 0.9     Impression / Plan:   Impression: 1.  Obstructive jaundice with mass in the head of the pancreas and pancreatic/biliary dilation: Status post EGD/ERCP 12/21 with Dr. Avram, unable to cannulate bile duct at ERCP, pancreatic duct stent placed with improvement in bilirubin, KUB with passing the stent admitted with worsening LFTs back in the beginning of  January, CA 19-9 less than 2, repeat ERCP 06/27/2023 with Dr. Charlanne with failed cholangiogram, pancreatic mass on MRCP with double duct sign-at that time continued on Cipro  400 every 12 for 3 days and recommended IR consult for possible PTC as an outpatient, also recommend EUS with biopsy, now admitted with increasing bilirubin; likely obstructive jaundice in the setting  of pancreatic mass 2.  H. pylori gastritis: Diagnosed on EGD 12/21: Had not started antibiotic therapy yet  Plan: 1.  Plan for repeat EUS/ERCP tomorrow 07/08/2023 with Dr. Wilhelmenia.  Did discuss risks, benefits, limitations and alternatives and the patient agrees to proceed. 2.  Patient will be n.p.o. at midnight for procedure tomorrow.  Can continue her current diet for now. 3.  Continue other supportive measures 4.  Continue to monitor LFTs 5.  Did call and discussed the plan with patient's daughter Adrienne [name] at [phone] per patient request.  Thank you for your kind consultation, we will continue to follow.  Delon Gibson Nixxon Faria  07/07/2023, 12:22 PM

## 2023-07-07 NOTE — Progress Notes (Signed)
 [facility]   [phone] [phone]   Clinic Follow up Note   Patient Care Team: [name], MD as PCP - General (Internal Medicine) [name], MD as PCP - Cardiology (Cardiology) [name], RN as Oncology Nurse Navigator Glean Stephane BROCKS, RN as Oncology Nurse Navigator [name], MD as Consulting Physician (General Surgery) [name], MD as Consulting Physician (Hematology) [name], MD as Attending Physician (Radiation Oncology) Nicholaus Sherlean CROME, [facility] (Inactive) as Pharmacist (Pharmacist)  Date of Service:  07/07/2023  CHIEF COMPLAINT: f/u of pancreatic mass and hyperbilirubinemia  CURRENT THERAPY:  Pending biopsy  Assessment and Plan    Pancreatic Mass Pancreatic mass under evaluation for malignancy. Scheduled for stent placement in bile duct and ultrasound-guided needle biopsy by Dr. Wilhelmenia tomorrow. Previous stent attempts unsuccessful; preferred over drainage tube for convenience. Biopsy crucial for diagnosis. Family prefers biopsy regardless of stent outcome. - Perform stent placement in bile duct - Perform ultrasound-guided needle biopsy of pancreatic mass - Consult GI specialist if stent placement unsuccessful - Consider drainage tube if stent placement fails - Repeat blood work today - Admit today for bed availability for tomorrow's procedure  Jaundice Jaundice likely secondary to pancreatic mass obstructing bile duct. Managed with planned stent placement. Consider drainage tube if stent placement unsuccessful. - Perform stent placement in bile duct - Consult GI specialist if stent placement unsuccessful - Consider drainage tube if stent placement fails  Hypertension Hypertension; missed medication this morning due to fasting. Plans to take medication after eating. - Take blood pressure medication after eating - Ensure access to food [facility] or hospital cafeteria)  General Health Maintenance PET  scan scheduled for January 16th to evaluate tumor extent; may need rescheduling based on discharge timing. Cholesterol injection scheduled for January 30th. - Reschedule PET scan if necessary - Administer cholesterol injection on January 30th  Plan -pt is clinically stable, will repeat lab today - Admit to [facility] hospital today - Update contact information to include patient's daughter - Call when hospital bed is available.         SUMMARY OF ONCOLOGIC HISTORY: Oncology History  Ductal carcinoma in situ (DCIS) of right breast  01/28/2019 Mammogram   Diagnostic Mammogram 01/28/19  IMPRESSION: Right breast retroareolar 7 mm grouped pleomorphic calcifications are suspicious.   02/02/2019 Initial Biopsy   Diagnosis 02/02/19 Breast, right, needle core biopsy, outer retroareolar - DUCTAL CARCINOMA IN SITU WITH CALCIFICATIONS. Microscopic Comment The ductal carcinoma in situ is intermediate grade. Estrogen and progesterone receptors will be performed.  Results: IMMUNOHISTOCHEMICAL AND MORPHOMETRIC ANALYSIS PERFORMED MANUALLY Estrogen Receptor: 90%, POSITIVE, STRONG STAINING INTENSITY Progesterone Receptor: 20%, POSITIVE, STRONG STAINING INTENSITY    02/02/2019 Cancer Staging   Staging form: Breast, AJCC 8th Edition - Clinical stage from 02/02/2019: Stage 0 (cTis (DCIS), cN0, cM0, G2, ER+, PR+, HER2: Not Assessed) - Signed by [name], MD on 02/10/2019   02/04/2019 Initial Diagnosis   Ductal carcinoma in situ (DCIS) of right breast   03/18/2019 Surgery   RIGHT BREAST LUMPECTOMY WITH RADIOACTIVE SEED LOCALIZATION by Dr. Curvin 03/18/19   03/18/2019 Pathology Results   FINAL MICROSCOPIC DIAGNOSIS: 03/18/19 A. BREAST, RIGHT, LUMPECTOMY:  - Ductal carcinoma in situ with calcifications and necrosis, 1.4 cm.  - Ductal carcinoma in situ focally 0.1 cm from medial and anterior  lumpectomy margins.  - Ductal carcinoma in situ involves the final anterior margin (See part  D).  - Ductal carcinoma in  situ focally 0.3 cm from superior  lumpectomy  margin.  - Biopsy site and biopsy clip.   B. BREAST, RIGHT LATERAL MARGIN, EXCISION:  - Benign breast tissue.  - No ductal carcinoma in situ.  - Final lateral margin greater than 1 cm.   C. BREAST, RIGHT MEDIAL MARGIN, EXCISION:  - Ductal carcinoma in situ, 0.3 cm.  - Ductal carcinoma in situ with focally 0.3 cm from final medial margin.   D. BREAST, RIGHT ANTERIOR MARGIN, EXCISION:  - Ductal carcinoma in situ, 1.5 cm.  - Ductal carcinoma in situ focally involves final anterior margin.   E. BREAST, RIGHT DEEP MARGIN, EXCISION:  - Fibrocystic changes.  - No ductal carcinoma in situ.  - Final posterior margin greater than 0.7 cm.    04/08/2019 Surgery   RE-EXCISION OF RIGHT BREAST ANTERIOR MARGINS by Dr Curvin 04/08/19   04/08/2019 Pathology Results    DIAGNOSIS: 04/08/19  A. BREAST, RIGHT, ANTERIOR SUPERIOR, EXCISION:  - Intermediate grade ductal carcinoma in situ with necrosis.  - In situ carcinoma is <1 mm (not on ink) from the new anterior superior  margin, multifocally.  - Intraductal papilloma.  - Fibrocystic change.   B. BREAST, RIGHT, ANTERIOR INFERIOR, EXCISION:  - Benign breast tissue with resection changes.   C. BREAST, RIGHT, MEDIAL, EXCISION:  - Benign breast tissue with resection changes.  - Fibroadenomatoid and fibrocystic changes.   05/06/2019 Mammogram   Right Mammogram 05/06/19 IMPRESSION: Suspicious finding with 2 groups of microcalcifications in the medial right breast. The anterior group measures 2 x 2 x 2 mm. The more posterior group measures 4 x 3 x 4 mm.   05/12/2019 Pathology Results   Diagnosis 05/12/19 1. Breast, right, needle core biopsy, medial - DUCTAL CARCINOMA IN SITU WITH CALCIFICATIONS. - SEE MICROSCOPIC DESCRIPTION. 2. Breast, right, needle core biopsy, medial - DUCTAL CARCINOMA IN SITU WITH CALCIFICATIONS. - SEE MICROSCOPIC DESCRIPTION. Results: IMMUNOHISTOCHEMICAL AND MORPHOMETRIC  ANALYSIS PERFORMED MANUALLY Estrogen Receptor: 100%, POSITIVE, STRONG STAINING INTENSITY Progesterone Receptor: 0%, NEGATIVE COMMENT: The negative hormone receptor study(ies) in this case has an internal positive control. Results: IMMUNOHISTOCHEMICAL AND MORPHOMETRIC ANALYSIS PERFORMED MANUALLY Estrogen Receptor: 100%, POSITIVE, STRONG STAINING INTENSITY Progesterone Receptor: 0%, NEGATIVE COMMENT: The negative hormone receptor study(ies) in this case has an internal positive control.   07/26/2019 Surgery   RIGHT MASTECTOMY WITH SENTINEL NODE BIOPSY by Dr. Curvin 07/26/19   07/26/2019 Pathology Results   FINAL MICROSCOPIC DIAGNOSIS: 07/26/19  A. LYMPH NODE, RIGHT #1, SENTINEL,  BIOPSY:  -  No carcinoma identified in one lymph node (0/1)   B. BREAST, RIGHT, MASTECTOMY:  -  Ductal carcinoma in situ, intermediate grade, 0.6 cm  -  Margins uninvolved by carcinoma (greater than 1 cm; posterior margin)  -  Previous biopsy site changes (x2)  -  See oncology table below   07/26/2019 Cancer Staging   Staging form: Breast, AJCC 8th Edition - Pathologic stage from 07/26/2019: Stage 0 (pTis (DCIS), pN0, cM0, G2, ER+, PR+, HER2: Not Assessed) - Signed by [name], MD on 08/10/2019      Discussed the use of AI scribe software for clinical note transcription with the patient, who gave verbal consent to proceed.  History of Present Illness   The patient, a 78 year old woman with a pancreatic mass and hyperpigmentation, presents for a follow-up visit. She reports no new symptoms over the weekend. She describes her urine as sometimes light, sometimes a little bit dark, but not dark yellow. The patient has been noted to have jaundice, which has been observed before  but not to this degree. She has a history of hypertension and did not take her blood pressure medication on the morning of the visit due to needing to eat first. The patient also has a cholesterol injection scheduled.         All other systems were  reviewed with the patient and are negative.  MEDICAL HISTORY:  Past Medical History:  Diagnosis Date   Allergy    Ambulates with cane    straight cane   Arthritis    knee, back   CHF (congestive heart failure) (HCC)    Ductal carcinoma in situ of breast 12/2018   R Breast-mastectomy only   Gout    History of blood transfusion 1986   w/ Hysterectomy surgery   Hyperlipidemia    diet controlled - no meds   Hypertension    Hypothyroidism    Pelvic kidney    lower right pelvic kidney    SBO (small bowel obstruction) (HCC) 10/2016   surgery    Sleep apnea    Mild - no mask needed per sleep study   Smoker    quit smoking 2018   Type 2 diabetes mellitus (HCC)     SURGICAL HISTORY: Past Surgical History:  Procedure Laterality Date   ABDOMINAL HYSTERECTOMY  1986   COMPLETE-precancerous   APPENDECTOMY     pt states it was removed when gallbladder was removed.   AUGMENTATION MAMMAPLASTY     BACK SURGERY     lower back   BIOPSY  06/14/2023   Procedure: BIOPSY;  Surgeon: Avram Lupita BRAVO, MD;  Location: [facility];  Service: Gastroenterology;;   BREAST BIOPSY Right 05/12/2019   times 2   BREAST LUMPECTOMY WITH RADIOACTIVE SEED LOCALIZATION Right 03/18/2019   Procedure: RIGHT BREAST LUMPECTOMY WITH RADIOACTIVE SEED LOCALIZATION;  Surgeon: [name], MD;  Location: [facility] OR;  Service: General;  Laterality: Right;   CHOLECYSTECTOMY     COLONOSCOPY  02/15/2008   Kaplan    COLONOSCOPY WITH PROPOFOL   02/27/2018   Dr.Nandigam   ECTOPIC PREGNANCY SURGERY     ERCP N/A 06/14/2023   Procedure: ENDOSCOPIC RETROGRADE CHOLANGIOPANCREATOGRAPHY (ERCP);  Surgeon: Avram Lupita BRAVO, MD;  Location: [facility];  Service: Gastroenterology;  Laterality: N/A;   ERCP N/A 06/27/2023   Procedure: ENDOSCOPIC RETROGRADE CHOLANGIOPANCREATOGRAPHY (ERCP);  Surgeon: Charlanne Groom, MD;  Location: [facility];  Service: Gastroenterology;  Laterality: N/A;   ESOPHAGOGASTRODUODENOSCOPY (EGD) WITH PROPOFOL   N/A 06/14/2023   Procedure: ESOPHAGOGASTRODUODENOSCOPY (EGD) WITH PROPOFOL ;  Surgeon: Avram Lupita BRAVO, MD;  Location: [facility];  Service: Gastroenterology;  Laterality: N/A;   JOINT REPLACEMENT     Left hip total Dr. Melodi 10-01-17   MALONEY DILATION  06/14/2023   Procedure: AGAPITO DILATION;  Surgeon: Avram Lupita BRAVO, MD;  Location: [facility];  Service: Gastroenterology;;   MASTECTOMY Right 07/26/2019   PANCREATIC STENT PLACEMENT  06/14/2023   Procedure: PANCREATIC STENT PLACEMENT;  Surgeon: Avram Lupita BRAVO, MD;  Location: [facility];  Service: Gastroenterology;;   PANCREATIC STENT PLACEMENT  06/27/2023   Procedure: PANCREATIC STENT PLACEMENT;  Surgeon: Charlanne Groom, MD;  Location: [facility];  Service: Gastroenterology;;   POLYPECTOMY     polypectomy-oropharynx     RE-EXCISION OF BREAST CANCER,SUPERIOR MARGINS Right 04/08/2019   Procedure: RE-EXCISION OF RIGHT BREAST ANTERIOR MARGINS;  Surgeon: Curvin Deward III, MD;  Location: [facility];  Service: General;  Laterality: Right;   right knee meniscus     SBO  10/2016   small bowel obstruction  SIMPLE MASTECTOMY WITH AXILLARY SENTINEL NODE BIOPSY Right 07/26/2019   Procedure: RIGHT MASTECTOMY WITH SENTINEL NODE BIOPSY;  Surgeon: [name], MD;  Location: [facility];  Service: General;  Laterality: Right;   SPHINCTEROTOMY  06/27/2023   Procedure: SPHINCTEROTOMY;  Surgeon: Charlanne Groom, MD;  Location: [facility];  Service: Gastroenterology;;   TOTAL HIP ARTHROPLASTY Left 10/01/2017   Procedure: LEFT  TOTAL HIP ARTHROPLASTY ANTERIOR APPROACH;  Surgeon: Melodi Lerner, MD;  Location: [facility];  Service: Orthopedics;  Laterality: Left;   TOTAL HIP ARTHROPLASTY Right 03/11/2018   Procedure: RIGHT TOTAL HIP ARTHROPLASTY ANTERIOR APPROACH;  Surgeon: Melodi Lerner, MD;  Location: [facility];  Service: Orthopedics;  Laterality: Right;    UPPER GASTROINTESTINAL ENDOSCOPY      I have reviewed the social history and family  history with the patient and they are unchanged from previous note.  ALLERGIES:  is allergic to lisinopril , statins, and atorvastatin .  MEDICATIONS:  Current Outpatient Medications  Medication Sig Dispense Refill   Blood Glucose Monitoring Suppl DEVI 1 each by Does not apply route in the morning, at noon, and at bedtime. May substitute to any manufacturer covered by patient's insurance. 1 each 0   Blood Glucose Monitoring Suppl DEVI 1 each by Does not apply route 3 (three) times daily. May dispense any manufacturer covered by patient's insurance. 1 each 0   cetirizine  (ZYRTEC ) 10 MG tablet Take 1 tablet (10 mg total) by mouth daily. (Patient taking differently: Take 10 mg by mouth daily as needed for allergies.) 30 tablet 11   Cholecalciferol (VITAMIN D -3) 125 MCG (5000 UT) TABS Take 1 tablet by mouth daily.     famotidine  (PEPCID ) 20 MG tablet Take 1 tablet (20 mg total) by mouth 2 (two) times daily. 30 tablet 1   fluticasone  (FLONASE ) 50 MCG/ACT nasal spray Place 2 sprays into both nostrils daily. 16 g 6   Glucose Blood (BLOOD GLUCOSE TEST STRIPS) STRP 1 each by In Vitro route in the morning, at noon, and at bedtime. May substitute to any manufacturer covered by patient's insurance. 100 strip 0   Glucose Blood (BLOOD GLUCOSE TEST STRIPS) STRP 1 each by Does not apply route 3 (three) times daily. Use as directed to check blood sugar. May dispense any manufacturer covered by patient's insurance and fits patient's device. 100 strip 0   insulin  aspart (NOVOLOG ) 100 UNIT/ML FlexPen Inject 0-6 Units into the skin 3 (three) times daily with meals. Check Blood Glucose (BG) and inject per scale: BG <150= 0 unit; BG 150-200= 1 unit; BG 201-250= 2 unit; BG 251-300= 3 unit; BG 301-350= 4 unit; BG 351-400= 5 unit; BG >400= 6 unit and Call Primary Care. 15 mL 0   Insulin  Glargine (BASAGLAR  KWIKPEN) 100 UNIT/ML Inject 12 Units into the skin daily. May substitute as needed per insurance. 15 mL 2   Insulin  Pen  Needle (PEN NEEDLES) 31G X 5 MM MISC 1 each by Does not apply route 3 (three) times daily. May dispense any manufacturer covered by patient's insurance. 100 each 0   Lancet Device MISC 1 each by Does not apply route in the morning, at noon, and at bedtime. May substitute to any manufacturer covered by patient's insurance. 1 each 0   Lancet Device MISC 1 each by Does not apply route 3 (three) times daily. May dispense any manufacturer covered by patient's insurance. 1 each 0   Lancets MISC 1 each by Does not apply route 3 (three) times daily. Use  as directed to check blood sugar. May dispense any manufacturer covered by patient's insurance and fits patient's device. 100 each 0   losartan  (COZAAR ) 100 MG tablet Take 1 tablet by mouth once daily 90 tablet 0   thyroid  (NP THYROID ) 90 MG tablet Take 1 tablet (90 mg total) by mouth daily. 30 tablet 5   No current facility-administered medications for this visit.    PHYSICAL EXAMINATION: ECOG PERFORMANCE STATUS: 1 - Symptomatic but completely ambulatory  Vitals:   07/07/23 0955 07/07/23 0958  BP: (!) 194/85 (!) 189/85  Pulse: 63   Resp: 17   Temp: (!) 97.5 F (36.4 C)   SpO2: 100%    Wt Readings from Last 3 Encounters:  07/07/23 168 lb 6.4 oz (76.4 kg)  07/03/23 169 lb 9.6 oz (76.9 kg)  06/26/23 167 lb 12.3 oz (76.1 kg)     GENERAL:alert, no distress and comfortable SKIN: skin color, texture, turgor are normal, no rashes or significant lesions EYES: normal, Conjunctiva are pink and non-injected, (+) jaundice  NECK: supple, thyroid  normal size, non-tender, without nodularity LYMPH:  no palpable lymphadenopathy in the cervical, axillary  LUNGS: clear to auscultation and percussion with normal breathing effort HEART: regular rate & rhythm and no murmurs and no lower extremity edema ABDOMEN:abdomen soft, non-tender and normal bowel sounds Musculoskeletal:no cyanosis of digits and no clubbing  NEURO: alert & oriented x 3 with fluent speech,  no focal motor/sensory deficits  LABORATORY DATA:  I have reviewed the data as listed    Latest Ref Rng & Units 07/07/2023   10:15 AM 07/03/2023    1:24 PM 06/28/2023    3:41 AM  CBC  WBC 4.0 - 10.5 K/uL 6.5  7.8  6.5   Hemoglobin 12.0 - 15.0 g/dL 87.7  86.7  88.8   Hematocrit 36.0 - 46.0 % 35.8  39.3  34.6   Platelets 150 - 400 K/uL 264  243  229         Latest Ref Rng & Units 07/03/2023    1:24 PM 06/28/2023    3:41 AM 06/27/2023    9:07 AM  CMP  Glucose 70 - 99 mg/dL 747  724    BUN 8 - 23 mg/dL 16  18    Creatinine 9.55 - 1.00 mg/dL 8.87  8.82    Sodium 864 - 145 mmol/L 137  137    Potassium 3.5 - 5.1 mmol/L 3.8  4.0    Chloride 98 - 111 mmol/L 105  106    CO2 22 - 32 mmol/L 24  22    Calcium  8.9 - 10.3 mg/dL 9.9  8.9    Total Protein 6.5 - 8.1 g/dL 7.5  7.0  7.0   Total Bilirubin 0.0 - 1.2 mg/dL 6.5  1.9  2.5   Alkaline Phos 38 - 126 U/L 1,310  878  804   AST 15 - 41 U/L 492  357  441   ALT 0 - 44 U/L 585  453  344       RADIOGRAPHIC STUDIES: I have personally reviewed the radiological images as listed and agreed with the findings in the report. No results found.    Orders Placed This Encounter  Procedures   CBC with Differential/Platelet    Standing Status:   Standing    Number of Occurrences:   50    Expiration Date:   07/06/2024   Comprehensive metabolic panel    Standing Status:   Standing    Number  of Occurrences:   50    Expiration Date:   07/06/2024   Protime-INR    Standing Status:   Future    Number of Occurrences:   1    Expected Date:   07/07/2023    Expiration Date:   07/06/2024   All questions were answered. The patient knows to call the clinic with any problems, questions or concerns. No barriers to learning was detected. The total time spent in the appointment was 25 minutes.     Onita Mattock, MD 07/07/2023

## 2023-07-07 NOTE — H&P (View-Only) (Signed)
 Consultation  Referring Provider: Dr. Celinda    Primary Care Physician:  [name], MD Primary Gastroenterologist: Dr. Shila       Reason for Consultation:   Jaundice/pancreatic mass         HPI:   Adrienne [name] is a 78 y.o. female with a past medical history as listed below significant for DCIS, hypertension, hypothyroidism and nonischemic cardiomyopathy as well as type 2 diabetes, who presents to the hospital today for elevated LFTs in the setting of pancreatic mass and jaundice and known biliary obstruction.    Admission 06/13/2023-06/16/2023 with obstructive jaundice, mass in the head of the pancreas, pancreatic and biliary dilation.  Status post EGD/ERCP 12/21 with Dr. Vona to cannulate bile duct ERCP-pancreatic duct stent placed with improvement in bilirubin.  Also therapeutic dilation of the proximal esophageal stenosis with 40 French Maloney and H. pylori gastritis.  Discharged 12/23 with improvement in total bili to 1.8, baby showed passing of the pancreatic stent, due to decrease in bilirubin IR did not proceed with PBD.    06/24/2023 labs at PCP showed stable total bili of 1.8, exponential increase in alk phos to 1379, AST and ALT increasing with AST 266, ALT 561.  Instructed to proceed to the emergency department for further eval.    06/26/2023-06/27/2023 patient followed by our service in the hospital.  At that point AST 72, ALT 296, alk phos 821, total bili 1.6, leukocytosis with WBC 11.4, lipase 58.  She was feeling well.  Repeat ERCP 06/27/2023 was unsuccessful, patient continued on Cipro  and advance diet, recommended EUS.    07/03/2023 CMP with a creatinine of 1.12, alk phos 1310, AST 492, ALT 585 and total bili 6.5.    07/07/2023 CMP with a creatinine of 1.33, alk phos 1123, AST 273, ALT 396, total bili 8.6.    Today, the patient tells me she is actually feeling fine.  She was admitted given increasing bilirubin.  Aware of plans for EUS tomorrow.  Denies any  new complaints or concerns, no abdominal pain, normal bowel habits, tolerating her diet, no nausea or vomiting.  She is jaundiced and has been for a while.    Denies fever or chills.  Previous GI workup: ERCP 07/24/2023 -Failed cholangiogram (this was second attempt at ERCP) -Pancreatic mass on MRCP with double duct sign  EGD/ERCP 06/14/23 - The examination was suspicious for a pancreatic tumor in the head of the pancreas. Pancreatic duct cannulated and partially filled - very dilated - underfilled by intent - given multiple wire passages and contrast injection a 4 Fr 3 cm no flap singel pigtail stent was placed. - Attempts at a cholangiogram failed. - Esophageal stenosis. Dilated 40 fr Maloney w/ mild mucosal disruption - she had been c/o mild dysphagia and had seen ENT - Mucosal changes suspicious for gastritis. Biopsied. H pylori positive.   MRCP/MR 06/14/23 IMPRESSION: 1. Vague, masslike fullness of the central pancreatic head measuring 2.5 x 2.0 cm. Minimal underlying diffusion restriction and hypoenhancement. Severe pancreatic ductal dilatation distally, measuring up to 1.3 cm in caliber, with mild pancreatic parenchymal atrophy. Findings are highly concerning for pancreatic adenocarcinoma. 2. Severe intra and extrahepatic biliary ductal dilatation the common bile duct effaced within the superior pancreatic head and the extrahepatic duct measuring up to 1.7 cm in caliber. 3. The portal confluence and central superior mesenteric vein appear to be encased and partially effaced in the vicinity of suspected pancreatic head mass. 4. Status post cholecystectomy. 5. No  evidence of lymphadenopathy or metastatic disease in the abdomen. 6. Cardiomegaly.  Past Medical History:  Diagnosis Date  . Allergy   . Ambulates with cane    straight cane  . Arthritis    knee, back  . CHF (congestive heart failure) (HCC)   . Ductal carcinoma in situ of breast 12/2018   R Breast-mastectomy only   . Gout   . History of blood transfusion 1986   w/ Hysterectomy surgery  . Hyperlipidemia    diet controlled - no meds  . Hypertension   . Hypothyroidism   . Pelvic kidney    lower right pelvic kidney   . SBO (small bowel obstruction) (HCC) 10/2016   surgery   . Sleep apnea    Mild - no mask needed per sleep study  . Smoker    quit smoking 2018  . Type 2 diabetes mellitus (HCC)     Past Surgical History:  Procedure Laterality Date  . ABDOMINAL HYSTERECTOMY  1986   COMPLETE-precancerous  . APPENDECTOMY     pt states it was removed when gallbladder was removed.  . AUGMENTATION MAMMAPLASTY    . BACK SURGERY     lower back  . BIOPSY  06/14/2023   Procedure: BIOPSY;  Surgeon: Avram Lupita BRAVO, MD;  Location: [facility];  Service: Gastroenterology;;  . BREAST BIOPSY Right 05/12/2019   times 2  . BREAST LUMPECTOMY WITH RADIOACTIVE SEED LOCALIZATION Right 03/18/2019   Procedure: RIGHT BREAST LUMPECTOMY WITH RADIOACTIVE SEED LOCALIZATION;  Surgeon: [name], MD;  Location: [facility] OR;  Service: General;  Laterality: Right;  . CHOLECYSTECTOMY    . COLONOSCOPY  02/15/2008   Debrah   . COLONOSCOPY WITH PROPOFOL   02/27/2018   Dr.Nandigam  . ECTOPIC PREGNANCY SURGERY    . ERCP N/A 06/14/2023   Procedure: ENDOSCOPIC RETROGRADE CHOLANGIOPANCREATOGRAPHY (ERCP);  Surgeon: Avram Lupita BRAVO, MD;  Location: [facility];  Service: Gastroenterology;  Laterality: N/A;  . ERCP N/A 06/27/2023   Procedure: ENDOSCOPIC RETROGRADE CHOLANGIOPANCREATOGRAPHY (ERCP);  Surgeon: Charlanne Groom, MD;  Location: [facility];  Service: Gastroenterology;  Laterality: N/A;  . ESOPHAGOGASTRODUODENOSCOPY (EGD) WITH PROPOFOL  N/A 06/14/2023   Procedure: ESOPHAGOGASTRODUODENOSCOPY (EGD) WITH PROPOFOL ;  Surgeon: Avram Lupita BRAVO, MD;  Location: [facility];  Service: Gastroenterology;  Laterality: N/A;  . JOINT REPLACEMENT     Left hip total Dr. Melodi 10-01-17  . MALONEY DILATION  06/14/2023   Procedure: AGAPITO  DILATION;  Surgeon: Avram Lupita BRAVO, MD;  Location: [facility];  Service: Gastroenterology;;  . MASTECTOMY Right 07/26/2019  . PANCREATIC STENT PLACEMENT  06/14/2023   Procedure: PANCREATIC STENT PLACEMENT;  Surgeon: Avram Lupita BRAVO, MD;  Location: [facility];  Service: Gastroenterology;;  . PANCREATIC STENT PLACEMENT  06/27/2023   Procedure: PANCREATIC STENT PLACEMENT;  Surgeon: Charlanne Groom, MD;  Location: [facility];  Service: Gastroenterology;;  . POLYPECTOMY    . polypectomy-oropharynx    . RE-EXCISION OF BREAST CANCER,SUPERIOR MARGINS Right 04/08/2019   Procedure: RE-EXCISION OF RIGHT BREAST ANTERIOR MARGINS;  Surgeon: [name], MD;  Location: [facility];  Service: General;  Laterality: Right;  . right knee meniscus    . SBO  10/2016   small bowel obstruction  . SIMPLE MASTECTOMY WITH AXILLARY SENTINEL NODE BIOPSY Right 07/26/2019   Procedure: RIGHT MASTECTOMY WITH SENTINEL NODE BIOPSY;  Surgeon: [name], MD;  Location: [facility];  Service: General;  Laterality: Right;  . SPHINCTEROTOMY  06/27/2023   Procedure: SPHINCTEROTOMY;  Surgeon: Charlanne Groom, MD;  Location:  [facility];  Service: Gastroenterology;;  . TOTAL HIP ARTHROPLASTY Left 10/01/2017   Procedure: LEFT  TOTAL HIP ARTHROPLASTY ANTERIOR APPROACH;  Surgeon: Melodi Lerner, MD;  Location: [facility];  Service: Orthopedics;  Laterality: Left;  . TOTAL HIP ARTHROPLASTY Right 03/11/2018   Procedure: RIGHT TOTAL HIP ARTHROPLASTY ANTERIOR APPROACH;  Surgeon: Melodi Lerner, MD;  Location: [facility];  Service: Orthopedics;  Laterality: Right;   . UPPER GASTROINTESTINAL ENDOSCOPY      Family History  Problem Relation Age of Onset  . Cancer Sister   . Diabetes Brother   . Diabetes Brother   . Hypertension Other   . Breast cancer Sister   . Colon cancer Neg Hx   . Colon polyps Neg Hx   . Rectal cancer Neg Hx   . Stomach cancer Neg Hx     Social History   Tobacco Use  . Smoking status: Former     Current packs/day: 0.00    Average packs/day: 1 pack/day for 32.0 years (32.0 ttl pk-yrs)    Types: E-cigarettes, Cigarettes    Start date: 11/20/1984    Quit date: 11/20/2016    Years since quitting: 6.6  . Smokeless tobacco: Never  . Tobacco comments:    Smoker since 1975 has quit and started back several times    As of 07/03/23: smoked 30 years, 1 PPD, quit 2018  Vaping Use  . Vaping status: Former  Substance Use Topics  . Alcohol use: No  . Drug use: No    Prior to Admission medications   Medication Sig Start Date End Date Taking? Authorizing Provider  cetirizine  (ZYRTEC ) 10 MG tablet Take 1 tablet (10 mg total) by mouth daily. Patient taking differently: Take 10 mg by mouth daily as needed for allergies. 05/15/23   Soldatova, Liuba, MD  Cholecalciferol (VITAMIN D -3) 125 MCG (5000 UT) TABS Take 1 tablet by mouth daily.    [provider]  famotidine  (PEPCID ) 20 MG tablet Take 1 tablet (20 mg total) by mouth 2 (two) times daily. 05/15/23   Soldatova, Liuba, MD  fluticasone  (FLONASE ) 50 MCG/ACT nasal spray Place 2 sprays into both nostrils daily. 05/15/23   Soldatova, Liuba, MD  insulin  aspart (NOVOLOG ) 100 UNIT/ML FlexPen Inject 0-6 Units into the skin 3 (three) times daily with meals. Check Blood Glucose (BG) and inject per scale: BG <150= 0 unit; BG 150-200= 1 unit; BG 201-250= 2 unit; BG 251-300= 3 unit; BG 301-350= 4 unit; BG 351-400= 5 unit; BG >400= 6 unit and Call Primary Care. 06/28/23   Rai, Nydia POUR, MD  Insulin  Glargine (BASAGLAR  KWIKPEN) 100 UNIT/ML Inject 12 Units into the skin daily. May substitute as needed per insurance. 06/28/23   Rai, Nydia POUR, MD  losartan  (COZAAR ) 100 MG tablet Take 1 tablet by mouth once daily 05/05/23   Patel, Rutwik K, MD  thyroid  (NP THYROID ) 90 MG tablet Take 1 tablet (90 mg total) by mouth daily. 02/14/23   [name], MD    Current Facility-Administered Medications  Medication Dose Route Frequency Provider Last Rate Last Admin   . 0.45 % sodium chloride  infusion   Intravenous Continuous Celinda Alm Lot, MD      . hydrALAZINE  (APRESOLINE ) tablet 50 mg  50 mg Oral Q6H PRN Celinda Alm Lot, MD      . insulin  aspart (novoLOG ) injection 0-15 Units  0-15 Units Subcutaneous TID WC Celinda Alm Lot, MD      . ondansetron  (ZOFRAN ) tablet 4 mg  4 mg  Oral Q6H PRN Celinda Alm Lot, MD       Or  . ondansetron  (ZOFRAN ) injection 4 mg  4 mg Intravenous Q6H PRN Celinda Alm Lot, MD        Allergies as of 07/07/2023 - Review Complete 07/07/2023  Allergen Reaction Noted  . Lisinopril  Swelling 03/22/2016  . Statins  10/10/2022  . Atorvastatin  Other (See Comments) 09/12/2016     Review of Systems:    Constitutional: No weight loss, fever or chills Skin: No rash  Cardiovascular: No chest pain Respiratory: No SOB Gastrointestinal: See HPI and otherwise negative Genitourinary: No dysuria Neurological: No headache, dizziness or syncope Musculoskeletal: No new muscle or joint pain Hematologic: No bleeding  Psychiatric: No history of depression or anxiety    Physical Exam:  Vital signs in last 24 hours: Temp:  [97.5 F (36.4 C)-97.7 F (36.5 C)] 97.7 F (36.5 C) (01/13 1204) Pulse Rate:  [63] 63 (01/13 1204) Resp:  [17-18] 18 (01/13 1204) BP: (151-194)/(64-85) 151/64 (01/13 1204) SpO2:  [100 %] 100 % (01/13 0955) Weight:  [76.4 kg] 76.4 kg (01/13 1204)   General:   Pleasant AA female appears to be in NAD, Well developed, Well nourished, alert and cooperative +jaundice Head:  Normocephalic and atraumatic. Eyes:   PEERL, EOMI. +icterus. Conjunctiva pink. Ears:  Normal auditory acuity. Neck:  Supple Throat: Oral cavity and pharynx without inflammation, swelling or lesion. Teeth in good condition. Lungs: Respirations even and unlabored. Lungs clear to auscultation bilaterally.   No wheezes, crackles, or rhonchi.  Heart: Normal S1, S2. No MRG. Regular rate and rhythm. No peripheral edema, cyanosis or  pallor.  Abdomen:  Soft, nondistended, nontender. No rebound or guarding. Normal bowel sounds. No appreciable masses or hepatomegaly. Rectal:  Not performed.  Msk:  Symmetrical without gross deformities. Peripheral pulses intact.  Extremities:  Without edema, no deformity or joint abnormality. Normal ROM, normal sensation. Neurologic:  Alert and  oriented x4;  grossly normal neurologically. Skin:   Dry and intact without significant lesions or rashes. Psychiatric: Demonstrates good judgement and reason without abnormal affect or behaviors.   LAB RESULTS: Recent Labs    07/07/23 1015  WBC 6.5  HGB 12.2  HCT 35.8*  PLT 264   BMET Recent Labs    07/07/23 1015  NA 141  K 3.7  CL 107  CO2 26  GLUCOSE 137*  BUN 16  CREATININE 1.33*  CALCIUM  9.6      Latest Ref Rng & Units 07/07/2023   10:15 AM 07/03/2023    1:24 PM 06/28/2023    3:41 AM  Hepatic Function  Total Protein 6.5 - 8.1 g/dL 7.1  7.5  7.0   Albumin 3.5 - 5.0 g/dL 3.8  4.0  3.2   AST 15 - 41 U/L 273  492  357   ALT 0 - 44 U/L 396  585  453   Alk Phosphatase 38 - 126 U/L 1,123  1,310  878   Total Bilirubin 0.0 - 1.2 mg/dL 8.6  6.5  1.9      PT/INR Recent Labs    07/07/23 1015  LABPROT 12.6  INR 0.9     Impression / Plan:   Impression: 1.  Obstructive jaundice with mass in the head of the pancreas and pancreatic/biliary dilation: Status post EGD/ERCP 12/21 with Dr. Avram, unable to cannulate bile duct at ERCP, pancreatic duct stent placed with improvement in bilirubin, KUB with passing the stent admitted with worsening LFTs back in the beginning of  January, CA 19-9 less than 2, repeat ERCP 06/27/2023 with Dr. Charlanne with failed cholangiogram, pancreatic mass on MRCP with double duct sign-at that time continued on Cipro  400 every 12 for 3 days and recommended IR consult for possible PTC as an outpatient, also recommend EUS with biopsy, now admitted with increasing bilirubin; likely obstructive jaundice in the setting  of pancreatic mass 2.  H. pylori gastritis: Diagnosed on EGD 12/21: Had not started antibiotic therapy yet  Plan: 1.  Plan for repeat EUS/ERCP tomorrow 07/08/2023 with Dr. Wilhelmenia.  Did discuss risks, benefits, limitations and alternatives and the patient agrees to proceed. 2.  Patient will be n.p.o. at midnight for procedure tomorrow.  Can continue her current diet for now. 3.  Continue other supportive measures 4.  Continue to monitor LFTs 5.  Did call and discussed the plan with patient's daughter [name] at [phone] per patient request.  Thank you for your kind consultation, we will continue to follow.  Delon Gibson Nixxon Faria  07/07/2023, 12:22 PM

## 2023-07-07 NOTE — Telephone Encounter (Signed)
 Critical lab values were reported from [facility] lab [facility]) for pt.  Tbili 8.6; AST 273; and ALT 396.  Pt admitted to room 1619 at [facility]

## 2023-07-07 NOTE — H&P (Signed)
 History and Physical    Patient: Adrienne [name] FMW:[id number] DOB: [birth year] DOA: 07/07/2023 DOS: the patient was seen and examined on 07/07/2023 PCP: [name], MD  Patient coming from: Home  Chief Complaint: Obstructive jaundice.  HPI: Adrienne [name] is a 78 y.o. female with medical history significant of seasonal allergies, osteoarthritis, mildly reduced EF CHF, ductal carcinoma in situ, hyperlipidemia, hypertension, hypothyroidism, pelvic kidney, small bowel obstruction, sleep apnea not on CPAP, tobacco use in remission, type 2 diabetes who has been admitted twice recently due to pancreatic mass with obstructive jaundice with 2 attempts to obtain EUS and biliary stent placement not successful.  Her bilirubin has risen from 6.5 to 8.6 mg/dL in the last 4 days.  CBC done today was unremarkable.  Normal PT/INR.  She is being referred by her oncologist for admission.  She is scheduled for a third ERCP attempt with Dr. Wilhelmenia tomorrow.   Review of Systems: As mentioned in the history of present illness. All other systems reviewed and are negative. Past Medical History:  Diagnosis Date   Allergy    Ambulates with cane    straight cane   Arthritis    knee, back   CHF (congestive heart failure) (HCC)    Ductal carcinoma in situ of breast 12/2018   R Breast-mastectomy only   Gout    History of blood transfusion 1986   w/ Hysterectomy surgery   Hyperlipidemia    diet controlled - no meds   Hypertension    Hypothyroidism    Pelvic kidney    lower right pelvic kidney    SBO (small bowel obstruction) (HCC) 10/2016   surgery    Sleep apnea    Mild - no mask needed per sleep study   Smoker    quit smoking 2018   Type 2 diabetes mellitus (HCC)    Past Surgical History:  Procedure Laterality Date   ABDOMINAL HYSTERECTOMY  1986   COMPLETE-precancerous   APPENDECTOMY     pt states it was removed when gallbladder was removed.   AUGMENTATION MAMMAPLASTY     BACK  SURGERY     lower back   BIOPSY  06/14/2023   Procedure: BIOPSY;  Surgeon: Avram Lupita BRAVO, MD;  Location: [facility];  Service: Gastroenterology;;   BREAST BIOPSY Right 05/12/2019   times 2   BREAST LUMPECTOMY WITH RADIOACTIVE SEED LOCALIZATION Right 03/18/2019   Procedure: RIGHT BREAST LUMPECTOMY WITH RADIOACTIVE SEED LOCALIZATION;  Surgeon: [name], MD;  Location: [facility];  Service: General;  Laterality: Right;   CHOLECYSTECTOMY     COLONOSCOPY  02/15/2008   Kaplan    COLONOSCOPY WITH PROPOFOL   02/27/2018   Dr.[email or web address]   ECTOPIC PREGNANCY SURGERY     ERCP N/A 06/14/2023   Procedure: ENDOSCOPIC RETROGRADE CHOLANGIOPANCREATOGRAPHY (ERCP);  Surgeon: Avram Lupita BRAVO, MD;  Location: [facility];  Service: Gastroenterology;  Laterality: N/A;   ERCP N/A 06/27/2023   Procedure: ENDOSCOPIC RETROGRADE CHOLANGIOPANCREATOGRAPHY (ERCP);  Surgeon: Charlanne Groom, MD;  Location: [facility];  Service: Gastroenterology;  Laterality: N/A;   ESOPHAGOGASTRODUODENOSCOPY (EGD) WITH PROPOFOL  N/A 06/14/2023   Procedure: ESOPHAGOGASTRODUODENOSCOPY (EGD) WITH PROPOFOL ;  Surgeon: Avram Lupita BRAVO, MD;  Location: [facility];  Service: Gastroenterology;  Laterality: N/A;   JOINT REPLACEMENT     Left hip total Dr. Melodi 10-01-17   MALONEY DILATION  06/14/2023   Procedure: AGAPITO DILATION;  Surgeon: Avram Lupita BRAVO, MD;  Location: [facility];  Service: Gastroenterology;;   MASTECTOMY Right 07/26/2019   PANCREATIC  STENT PLACEMENT  06/14/2023   Procedure: PANCREATIC STENT PLACEMENT;  Surgeon: Avram Lupita BRAVO, MD;  Location: [facility];  Service: Gastroenterology;;   PANCREATIC STENT PLACEMENT  06/27/2023   Procedure: PANCREATIC STENT PLACEMENT;  Surgeon: Charlanne Groom, MD;  Location: [facility];  Service: Gastroenterology;;   POLYPECTOMY     polypectomy-oropharynx     RE-EXCISION OF BREAST CANCER,SUPERIOR MARGINS Right 04/08/2019   Procedure: RE-EXCISION OF RIGHT BREAST ANTERIOR MARGINS;  Surgeon:  Curvin Mt III, MD;  Location: [facility];  Service: General;  Laterality: Right;   right knee meniscus     SBO  10/2016   small bowel obstruction   SIMPLE MASTECTOMY WITH AXILLARY SENTINEL NODE BIOPSY Right 07/26/2019   Procedure: RIGHT MASTECTOMY WITH SENTINEL NODE BIOPSY;  Surgeon: Curvin Mt MOULD, MD;  Location: [facility];  Service: General;  Laterality: Right;   SPHINCTEROTOMY  06/27/2023   Procedure: SPHINCTEROTOMY;  Surgeon: Charlanne Groom, MD;  Location: [facility];  Service: Gastroenterology;;   TOTAL HIP ARTHROPLASTY Left 10/01/2017   Procedure: LEFT  TOTAL HIP ARTHROPLASTY ANTERIOR APPROACH;  Surgeon: Melodi Lerner, MD;  Location: [facility];  Service: Orthopedics;  Laterality: Left;   TOTAL HIP ARTHROPLASTY Right 03/11/2018   Procedure: RIGHT TOTAL HIP ARTHROPLASTY ANTERIOR APPROACH;  Surgeon: Melodi Lerner, MD;  Location: [facility];  Service: Orthopedics;  Laterality: Right;    UPPER GASTROINTESTINAL ENDOSCOPY     Social History:  reports that she quit smoking about 6 years ago. Her smoking use included e-cigarettes and cigarettes. She started smoking about 38 years ago. She has a 32 pack-year smoking history. She has never used smokeless tobacco. She reports that she does not drink alcohol and does not use drugs.  Allergies  Allergen Reactions   Lisinopril  Swelling    Angioedema   Statins     Myalgias   Atorvastatin  Other (See Comments)    Muscle aches     Family History  Problem Relation Age of Onset   Cancer Sister    Diabetes Brother    Diabetes Brother    Hypertension Other    Breast cancer Sister    Colon cancer Neg Hx    Colon polyps Neg Hx    Rectal cancer Neg Hx    Stomach cancer Neg Hx     Prior to Admission medications   Medication Sig Start Date End Date Taking? Authorizing Provider  Blood Glucose Monitoring Suppl DEVI 1 each by Does not apply route in the morning, at noon, and at bedtime. May substitute to any manufacturer covered by  patient's insurance. 06/24/23   [name], MD  Blood Glucose Monitoring Suppl DEVI 1 each by Does not apply route 3 (three) times daily. May dispense any manufacturer covered by patient's insurance. 06/28/23   Rai, Nydia POUR, MD  cetirizine  (ZYRTEC ) 10 MG tablet Take 1 tablet (10 mg total) by mouth daily. Patient taking differently: Take 10 mg by mouth daily as needed for allergies. 05/15/23   Soldatova, Liuba, MD  Cholecalciferol (VITAMIN D -3) 125 MCG (5000 UT) TABS Take 1 tablet by mouth daily.    [provider]  famotidine  (PEPCID ) 20 MG tablet Take 1 tablet (20 mg total) by mouth 2 (two) times daily. 05/15/23   Soldatova, Liuba, MD  fluticasone  (FLONASE ) 50 MCG/ACT nasal spray Place 2 sprays into both nostrils daily. 05/15/23   Soldatova, Liuba, MD  Glucose Blood (BLOOD GLUCOSE TEST STRIPS) STRP 1 each by In Vitro route in the morning, at noon, and  at bedtime. May substitute to any manufacturer covered by patient's insurance. 06/24/23 07/27/23  [name], MD  Glucose Blood (BLOOD GLUCOSE TEST STRIPS) STRP 1 each by Does not apply route 3 (three) times daily. Use as directed to check blood sugar. May dispense any manufacturer covered by patient's insurance and fits patient's device. 06/28/23   Rai, Nydia POUR, MD  insulin  aspart (NOVOLOG ) 100 UNIT/ML FlexPen Inject 0-6 Units into the skin 3 (three) times daily with meals. Check Blood Glucose (BG) and inject per scale: BG <150= 0 unit; BG 150-200= 1 unit; BG 201-250= 2 unit; BG 251-300= 3 unit; BG 301-350= 4 unit; BG 351-400= 5 unit; BG >400= 6 unit and Call Primary Care. 06/28/23   Rai, Nydia POUR, MD  Insulin  Glargine (BASAGLAR  KWIKPEN) 100 UNIT/ML Inject 12 Units into the skin daily. May substitute as needed per insurance. 06/28/23   Rai, Nydia POUR, MD  Insulin  Pen Needle (PEN NEEDLES) 31G X 5 MM MISC 1 each by Does not apply route 3 (three) times daily. May dispense any manufacturer covered by patient's insurance. 06/28/23   Rai,  Nydia POUR, MD  Lancet Device MISC 1 each by Does not apply route in the morning, at noon, and at bedtime. May substitute to any manufacturer covered by patient's insurance. 06/24/23 07/24/23  [name], MD  Lancet Device MISC 1 each by Does not apply route 3 (three) times daily. May dispense any manufacturer covered by patient's insurance. 06/28/23   Rai, Nydia POUR, MD  Lancets MISC 1 each by Does not apply route 3 (three) times daily. Use as directed to check blood sugar. May dispense any manufacturer covered by patient's insurance and fits patient's device. 06/28/23   Rai, Nydia POUR, MD  losartan  (COZAAR ) 100 MG tablet Take 1 tablet by mouth once daily 05/05/23   Patel, Rutwik K, MD  thyroid  (NP THYROID ) 90 MG tablet Take 1 tablet (90 mg total) by mouth daily. 02/14/23   [name], MD    Physical Exam: There were no vitals filed for this visit. Physical Exam Vitals and nursing note reviewed.  Constitutional:      General: She is awake. She is not in acute distress.    Appearance: Normal appearance. She is ill-appearing.  HENT:     Head: Normocephalic.     Nose: No rhinorrhea.     Mouth/Throat:     Mouth: Mucous membranes are dry.  Eyes:     General: Scleral icterus present.     Pupils: Pupils are equal, round, and reactive to light.  Neck:     Vascular: No JVD.  Cardiovascular:     Rate and Rhythm: Normal rate and regular rhythm.     Heart sounds: S1 normal and S2 normal.  Pulmonary:     Effort: Pulmonary effort is normal.     Breath sounds: Normal breath sounds. No wheezing, rhonchi or rales.  Abdominal:     General: Bowel sounds are normal. There is no distension.     Palpations: Abdomen is soft.     Tenderness: There is no abdominal tenderness.  Musculoskeletal:     Cervical back: Neck supple.     Right lower leg: No edema.     Left lower leg: No edema.  Skin:    General: Skin is warm and dry.  Neurological:     General: No focal deficit present.     Mental  Status: She is alert and oriented to person, place, and time.  Psychiatric:        Mood and Affect: Mood normal.        Behavior: Behavior normal. Behavior is cooperative.     Data Reviewed:  Results are pending, will review when available.  09/12/2022 TTE report. IMPRESSIONS:   1. Left ventricular ejection fraction, by estimation, is 45 to 50%. The  left ventricle has mildly decreased function. The left ventricle  demonstrates global hypokinesis. Left ventricular diastolic parameters are  consistent with Grade II diastolic  dysfunction (pseudonormalization).   2. Right ventricular systolic function is normal. The right ventricular  size is normal.   3. Left atrial size was moderately dilated.   4. The mitral valve is normal in structure. Mild mitral valve  regurgitation. No evidence of mitral stenosis.   5. The aortic valve is tricuspid. Aortic valve regurgitation is not  visualized. Aortic valve sclerosis is present, with no evidence of aortic  valve stenosis.   6. Aortic dilatation noted. There is borderline dilatation of the  ascending aorta, measuring 38 mm.   7. The inferior vena cava is normal in size with greater than 50%  respiratory variability, suggesting right atrial pressure of 3 mmHg.    Assessment and Plan: Principal Problem:   Pancreatic mass   Obstructive jaundice Observation/MedSurg. Continue IV fluids. Keep n.p.o. after midnight. Antiemetics as needed. Follow CBC, CMP  in AM. GI consult appreciated. For ERCP tomorrow.  Active Problems:   Type 2 diabetes mellitus with other specified complication (HCC) Carbohydrate modified diet. CBG monitoring with RI SS. Continue Basaglar  12 units SQ at bedtime or formulary equivalent. Check hemoglobin A1c.    GERD (gastroesophageal reflux disease)   Gastritis and gastroduodenitis Continue famotidine  20 mg p.o. daily.    Elevated serum creatinine Hold losartan . Gentle IV hydration. Follow-up creatinine in  AM.    Hypertension Hold losartan  due to creatinine increase. Monitor blood pressure.    HLD (hyperlipidemia) No statin given abnormal LFTs.    Chronic systolic CHF (congestive heart failure) (HCC) Seems to be euvolemic.    Hypothyroidism Continue thyroid  Armour.    Glaucoma of both eyes Currently not on high drops. Follow-up with ophthalmology as an outpatient.    ILD (interstitial lung disease) (HCC) Bronchodilators as needed.    Advance Care Planning:   Code Status: Full Code   Consults:   Family Communication:   Severity of Illness: The appropriate patient status for this patient is INPATIENT. Inpatient status is judged to be reasonable and necessary in order to provide the required intensity of service to ensure the patient's safety. The patient's presenting symptoms, physical exam findings, and initial radiographic and laboratory data in the context of their chronic comorbidities is felt to place them at high risk for further clinical deterioration. Furthermore, it is not anticipated that the patient will be medically stable for discharge from the hospital within 2 midnights of admission.   * I certify that at the point of admission it is my clinical judgment that the patient will require inpatient hospital care spanning beyond 2 midnights from the point of admission due to high intensity of service, high risk for further deterioration and high frequency of surveillance required.*  Author: Alm Dorn Castor, MD 07/07/2023 11:53 AM  For on call review [email or web address].   This document was prepared using Dragon voice recognition software and may contain some unintended transcription errors.

## 2023-07-08 ENCOUNTER — Ambulatory Visit (HOSPITAL_COMMUNITY): Admit: 2023-07-08 | Payer: PPO | Admitting: Gastroenterology

## 2023-07-08 ENCOUNTER — Inpatient Hospital Stay (HOSPITAL_COMMUNITY): Payer: PPO

## 2023-07-08 ENCOUNTER — Inpatient Hospital Stay (HOSPITAL_COMMUNITY): Payer: PPO | Admitting: Anesthesiology

## 2023-07-08 ENCOUNTER — Encounter (HOSPITAL_COMMUNITY): Payer: Self-pay | Admitting: Gastroenterology

## 2023-07-08 ENCOUNTER — Encounter (HOSPITAL_COMMUNITY)
Admission: RE | Disposition: A | Discharge status: Home / Self Care | Payer: Self-pay | Source: Ambulatory Visit | Attending: Internal Medicine

## 2023-07-08 DIAGNOSIS — K2950 Unspecified chronic gastritis without bleeding: Secondary | ICD-10-CM | POA: Diagnosis not present

## 2023-07-08 DIAGNOSIS — G4733 Obstructive sleep apnea (adult) (pediatric): Secondary | ICD-10-CM | POA: Diagnosis not present

## 2023-07-08 DIAGNOSIS — R7989 Other specified abnormal findings of blood chemistry: Secondary | ICD-10-CM | POA: Diagnosis not present

## 2023-07-08 DIAGNOSIS — K2289 Other specified disease of esophagus: Secondary | ICD-10-CM

## 2023-07-08 DIAGNOSIS — K3189 Other diseases of stomach and duodenum: Secondary | ICD-10-CM

## 2023-07-08 DIAGNOSIS — K831 Obstruction of bile duct: Secondary | ICD-10-CM

## 2023-07-08 DIAGNOSIS — C240 Malignant neoplasm of extrahepatic bile duct: Secondary | ICD-10-CM

## 2023-07-08 DIAGNOSIS — K838 Other specified diseases of biliary tract: Secondary | ICD-10-CM | POA: Diagnosis not present

## 2023-07-08 DIAGNOSIS — K449 Diaphragmatic hernia without obstruction or gangrene: Secondary | ICD-10-CM

## 2023-07-08 DIAGNOSIS — I899 Noninfective disorder of lymphatic vessels and lymph nodes, unspecified: Secondary | ICD-10-CM

## 2023-07-08 DIAGNOSIS — E119 Type 2 diabetes mellitus without complications: Secondary | ICD-10-CM

## 2023-07-08 DIAGNOSIS — K8689 Other specified diseases of pancreas: Secondary | ICD-10-CM | POA: Diagnosis not present

## 2023-07-08 DIAGNOSIS — K31A11 Gastric intestinal metaplasia without dysplasia, involving the antrum: Secondary | ICD-10-CM

## 2023-07-08 HISTORY — PX: BILIARY STENT PLACEMENT: SHX5538

## 2023-07-08 HISTORY — PX: ERCP: SHX5425

## 2023-07-08 HISTORY — PX: SPHINCTEROTOMY: SHX5544

## 2023-07-08 HISTORY — PX: EUS: SHX5427

## 2023-07-08 HISTORY — PX: ESOPHAGOGASTRODUODENOSCOPY: SHX5428

## 2023-07-08 HISTORY — PX: BILIARY BRUSHING: SHX6843

## 2023-07-08 HISTORY — PX: BIOPSY: SHX5522

## 2023-07-08 HISTORY — PX: FINE NEEDLE ASPIRATION: SHX5430

## 2023-07-08 LAB — CBC
HCT: 31.9 % — ABNORMAL LOW (ref 36.0–46.0)
Hemoglobin: 10.8 g/dL — ABNORMAL LOW (ref 12.0–15.0)
MCH: 30.9 pg (ref 26.0–34.0)
MCHC: 33.9 g/dL (ref 30.0–36.0)
MCV: 91.1 fL (ref 80.0–100.0)
Platelets: 218 10*3/uL (ref 150–400)
RBC: 3.5 MIL/uL — ABNORMAL LOW (ref 3.87–5.11)
RDW: 15.5 % (ref 11.5–15.5)
WBC: 7.8 10*3/uL (ref 4.0–10.5)
nRBC: 0 % (ref 0.0–0.2)

## 2023-07-08 LAB — COMPREHENSIVE METABOLIC PANEL
ALT: 298 U/L — ABNORMAL HIGH (ref 0–44)
AST: 203 U/L — ABNORMAL HIGH (ref 15–41)
Albumin: 2.9 g/dL — ABNORMAL LOW (ref 3.5–5.0)
Alkaline Phosphatase: 848 U/L — ABNORMAL HIGH (ref 38–126)
Anion gap: 8 (ref 5–15)
BUN: 16 mg/dL (ref 8–23)
CO2: 19 mmol/L — ABNORMAL LOW (ref 22–32)
Calcium: 8.3 mg/dL — ABNORMAL LOW (ref 8.9–10.3)
Chloride: 108 mmol/L (ref 98–111)
Creatinine, Ser: 0.8 mg/dL (ref 0.44–1.00)
GFR, Estimated: >60 mL/min (ref >60)
Glucose, Bld: 118 mg/dL — ABNORMAL HIGH (ref 70–99)
Potassium: 3 mmol/L — ABNORMAL LOW (ref 3.5–5.1)
Sodium: 135 mmol/L (ref 135–145)
Total Bilirubin: 8.2 mg/dL — ABNORMAL HIGH (ref 0.0–1.2)
Total Protein: 6.4 g/dL — ABNORMAL LOW (ref 6.5–8.1)

## 2023-07-08 LAB — GLUCOSE, CAPILLARY
Glucose-Capillary: 120 mg/dL — ABNORMAL HIGH (ref 70–99)
Glucose-Capillary: 169 mg/dL — ABNORMAL HIGH (ref 70–99)
Glucose-Capillary: 156 mg/dL — ABNORMAL HIGH (ref 70–99)
Glucose-Capillary: 129 mg/dL — ABNORMAL HIGH (ref 70–99)

## 2023-07-08 LAB — CHROMOGRANIN A: Chromogranin A (ng/mL): 154.8 ng/mL — ABNORMAL HIGH (ref 0.0–101.8)

## 2023-07-08 SURGERY — EGD (ESOPHAGOGASTRODUODENOSCOPY)
Anesthesia: General

## 2023-07-08 MED ORDER — ROCURONIUM BROMIDE 10 MG/ML (PF) SYRINGE
PREFILLED_SYRINGE | INTRAVENOUS | Status: DC | PRN
  Administered 2023-07-08: 70 mg via INTRAVENOUS

## 2023-07-08 MED ORDER — HYDROMORPHONE HCL 1 MG/ML IJ SOLN
0.5000 mg | INTRAMUSCULAR | Status: DC | PRN
  Administered 2023-07-08: 0.5 mg via INTRAVENOUS
  Filled 2023-07-08: qty 0.5

## 2023-07-08 MED ORDER — SUGAMMADEX SODIUM 200 MG/2ML IV SOLN
INTRAVENOUS | Status: DC | PRN
  Administered 2023-07-08: 200 mg via INTRAVENOUS

## 2023-07-08 MED ORDER — CIPROFLOXACIN IN D5W 400 MG/200ML IV SOLN
400.0000 mg | Freq: Two times a day (BID) | INTRAVENOUS | Status: DC
  Administered 2023-07-08 – 2023-07-09 (×2): 400 mg via INTRAVENOUS
  Filled 2023-07-08 (×2): qty 200

## 2023-07-08 MED ORDER — HYDRALAZINE HCL 50 MG PO TABS
50.0000 mg | ORAL_TABLET | Freq: Three times a day (TID) | ORAL | Status: DC
  Administered 2023-07-08 – 2023-07-09 (×3): 50 mg via ORAL
  Filled 2023-07-08 (×3): qty 1

## 2023-07-08 MED ORDER — SODIUM CHLORIDE 0.9 % IV SOLN
INTRAVENOUS | Status: DC | PRN
  Administered 2023-07-08: 35 mL

## 2023-07-08 MED ORDER — SODIUM CHLORIDE 0.9 % IV SOLN: INTRAVENOUS | Status: DC

## 2023-07-08 MED ORDER — CIPROFLOXACIN IN D5W 400 MG/200ML IV SOLN
INTRAVENOUS | Status: DC | PRN
  Administered 2023-07-08: 400 mg via INTRAVENOUS

## 2023-07-08 MED ORDER — DEXAMETHASONE SODIUM PHOSPHATE 10 MG/ML IJ SOLN
INTRAMUSCULAR | Status: DC | PRN
  Administered 2023-07-08: 10 mg via INTRAVENOUS

## 2023-07-08 MED ORDER — FENTANYL CITRATE (PF) 100 MCG/2ML IJ SOLN
INTRAMUSCULAR | Status: AC
  Filled 2023-07-08: qty 2

## 2023-07-08 MED ORDER — HYDRALAZINE HCL 20 MG/ML IJ SOLN
5.0000 mg | Freq: Once | INTRAMUSCULAR | Status: AC
  Administered 2023-07-08: 5 mg via INTRAVENOUS

## 2023-07-08 MED ORDER — CIPROFLOXACIN IN D5W 400 MG/200ML IV SOLN
INTRAVENOUS | Status: AC
  Filled 2023-07-08: qty 200

## 2023-07-08 MED ORDER — SODIUM CHLORIDE 0.9 % IV SOLN
1.5000 g | Freq: Once | INTRAVENOUS | Status: DC
  Filled 2023-07-08: qty 4

## 2023-07-08 MED ORDER — GLYCOPYRROLATE 0.2 MG/ML IJ SOLN
INTRAMUSCULAR | Status: DC | PRN
  Administered 2023-07-08: .2 mg via INTRAVENOUS

## 2023-07-08 MED ORDER — DICLOFENAC SUPPOSITORY 100 MG
RECTAL | Status: DC | PRN
  Administered 2023-07-08: 100 mg via RECTAL

## 2023-07-08 MED ORDER — SODIUM CHLORIDE 0.45 % IV SOLN
INTRAVENOUS | Status: DC
  Administered 2023-07-08: 100 mL/h via INTRAVENOUS

## 2023-07-08 MED ORDER — PROPOFOL 10 MG/ML IV BOLUS
INTRAVENOUS | Status: DC | PRN
  Administered 2023-07-08: 120 mg via INTRAVENOUS

## 2023-07-08 MED ORDER — FENTANYL CITRATE (PF) 100 MCG/2ML IJ SOLN
INTRAMUSCULAR | Status: DC | PRN
  Administered 2023-07-08: 100 ug via INTRAVENOUS

## 2023-07-08 MED ORDER — LIDOCAINE 2% (20 MG/ML) 5 ML SYRINGE
INTRAMUSCULAR | Status: DC | PRN
  Administered 2023-07-08: 60 mg via INTRAVENOUS

## 2023-07-08 MED ORDER — HYDRALAZINE HCL 20 MG/ML IJ SOLN
INTRAMUSCULAR | Status: AC
  Filled 2023-07-08: qty 1

## 2023-07-08 MED ORDER — GLUCAGON HCL RDNA (DIAGNOSTIC) 1 MG IJ SOLR
INTRAMUSCULAR | Status: DC | PRN
  Administered 2023-07-08 (×3): .25 mg via INTRAVENOUS

## 2023-07-08 MED ORDER — PHENYLEPHRINE 80 MCG/ML (10ML) SYRINGE FOR IV PUSH (FOR BLOOD PRESSURE SUPPORT)
PREFILLED_SYRINGE | INTRAVENOUS | Status: DC | PRN
  Administered 2023-07-08 (×3): 80 ug via INTRAVENOUS

## 2023-07-08 MED ORDER — DICLOFENAC SUPPOSITORY 100 MG
RECTAL | Status: AC
  Filled 2023-07-08: qty 1

## 2023-07-08 MED ORDER — ONDANSETRON HCL 4 MG/2ML IJ SOLN
INTRAMUSCULAR | Status: DC | PRN
  Administered 2023-07-08: 4 mg via INTRAVENOUS

## 2023-07-08 MED ORDER — GLUCAGON HCL RDNA (DIAGNOSTIC) 1 MG IJ SOLR
INTRAMUSCULAR | Status: AC
  Filled 2023-07-08: qty 1

## 2023-07-08 MED ORDER — HYDRALAZINE HCL 20 MG/ML IJ SOLN: 10.0000 mg | Freq: Four times a day (QID) | INTRAMUSCULAR | Status: DC | PRN

## 2023-07-08 MED ORDER — DICLOFENAC SUPPOSITORY 100 MG: 100.0000 mg | Freq: Once | RECTAL | Status: DC

## 2023-07-08 MED ORDER — EPHEDRINE SULFATE-NACL 50-0.9 MG/10ML-% IV SOSY
PREFILLED_SYRINGE | INTRAVENOUS | Status: DC | PRN
  Administered 2023-07-08: 5 mg via INTRAVENOUS
  Administered 2023-07-08 (×2): 10 mg via INTRAVENOUS

## 2023-07-08 MED ORDER — POTASSIUM CHLORIDE 10 MEQ/100ML IV SOLN
10.0000 meq | INTRAVENOUS | Status: AC
  Administered 2023-07-08 (×2): 10 meq via INTRAVENOUS
  Filled 2023-07-08: qty 100

## 2023-07-08 NOTE — Progress Notes (Signed)
 PROGRESS NOTE  Adrienne [name] FMW:[id number] DOB: [birth year] DOA: 07/07/2023 PCP: [name], MD   LOS: 1 day   Brief narrative:  Adrienne [name] is a 78 y.o. female with medical history significant of chronic systolic congestive heart failure, ductal carcinoma in situ, hyperlipidemia, hypertension, hypothyroidism, small bowel obstruction, sleep apnea not on CPAP, tobacco use in remission, type 2 diabetes who was recently admitted hospital for pancreatic mass and obstructive jaundice but with 2 failed attempts at EUS and biliary stent presented to hospital with increasing bilirubin levels from 6.5 to 8.6 mg/dL in the last 4 days.  Creatinine from 07/07/2023 was 1.3.  CBC was unremarkable with normal PT and INR.[name]  Patient was referred by her oncologist for admission and was admitted for third ERCP attempt with Dr. Wilhelmenia, GI.   Assessment and Plan:    Pancreatic mass leading to  Obstructive jaundice Continue IV fluids n.p.o. antiemetics.  AST and ALT elevated with elevated bilirubin.  GI has been consulted.  Awaiting for ERCP.    Type 2 diabetes mellitus with other specified complication (HCC) Continue sliding scale insulin  Accu-Cheks diabetic diet, long-acting insulin .     GERD (gastroesophageal reflux disease)   Gastritis and gastroduodenitis Continue Pepcid      Hypertension Losartan  on hold due to mild AKI.   hyperlipidemia No statin given abnormal LFTs.  Hypokalemia.  Potassium 3.0 today.  Will replace.  Check BMP in AM.  Will consider IV potassium today since the patient is NPO.  After normal saline at this time due to n.p.o.  Mild AKI.  Creatinine on presentation at 1.3.  Creatinine has improved to 3.8 today.  Losartan  on hold.  Continue IV fluids with half-normal saline.     Chronic systolic CHF (congestive heart failure) (HCC) Seems to be euvolemic.  Hold losartan .     Hypothyroidism Continue thyroid  Armour.     Glaucoma of both eyes Not on  eyedrops.  Follow-up with pulmonary as outpatient.     ILD (interstitial lung disease)  Bronchodilators as needed.    DVT prophylaxis: SCDs Start: 07/07/23 1142   Disposition: Home likely in 1 to 2 days  Status is: Inpatient Remains inpatient appropriate because: Need for ERCP and attempt at biliary decompression.    Code Status:     Code Status: Full Code  Family Communication: None at bedside  Consultants: GI Oncology  Procedures: None yet  Anti-infectives:  None  Anti-infectives (From admission, onward)    None        Subjective: Today, patient was seen and examined at bedside.  Patient denies any nausea, vomiting, fever, chills or rigor.  States that her BP is elevated.  Awaiting for ERCP.  Objective: Vitals:   07/08/23 0024 07/08/23 0530  BP: (!) 186/73 (!) 163/69  Pulse:  (!) 52  Resp:  18  Temp:  98.5 F (36.9 C)  SpO2:  100%    Intake/Output Summary (Last 24 hours) at 07/08/2023 0918 Last data filed at 07/08/2023 0303 Gross per 24 hour  Intake 1122.16 ml  Output 1 ml  Net 1121.16 ml   Filed Weights   07/07/23 1204  Weight: 76.4 kg   Body mass index is 29.83 kg/m.   Physical Exam:  GENERAL: Patient is alert awake and oriented. Not in obvious distress. HENT: Mild pallor and icterus noted.  Pupils equally reactive to light. Oral mucosa is moist NECK: is supple, no gross swelling noted. CHEST: Clear to auscultation. No crackles or wheezes.  Diminished breath  sounds bilaterally. CVS: S1 and S2 heard, no murmur. Regular rate and rhythm.  ABDOMEN: Soft, non-tender, bowel sounds are present. EXTREMITIES: No edema. CNS: Cranial nerves are intact. No focal motor deficits. SKIN: warm and dry without rashes.  Data Review: I have personally reviewed the following laboratory data and studies,  CBC: Recent Labs  Lab 07/03/23 1324 07/07/23 1015 07/08/23 0541  WBC 7.8 6.5 7.8  NEUTROABS 4.7 4.5  --   HGB 13.2 12.2 10.8*  HCT 39.3 35.8*  31.9*  MCV 91.6 89.9 91.1  PLT 243 264 218   Basic Metabolic Panel: Recent Labs  Lab 07/03/23 1324 07/07/23 1015 07/08/23 0541  NA 137 141 135  K 3.8 3.7 3.0*  CL 105 107 108  CO2 24 26 19*  GLUCOSE 252* 137* 118*  BUN 16 16 16   CREATININE 1.12* 1.33* 0.80  CALCIUM  9.9 9.6 8.3*   Liver Function Tests: Recent Labs  Lab 07/03/23 1324 07/07/23 1015 07/08/23 0541  AST 492* 273* 203*  ALT 585* 396* 298*  ALKPHOS 1,310* 1,123* 848*  BILITOT 6.5* 8.6* 8.2*  PROT 7.5 7.1 6.4*  ALBUMIN 4.0 3.8 2.9*   No results for input(s): LIPASE, AMYLASE in the last 168 hours. No results for input(s): AMMONIA in the last 168 hours. Cardiac Enzymes: No results for input(s): CKTOTAL, CKMB, CKMBINDEX, TROPONINI in the last 168 hours. BNP (last 3 results) No results for input(s): BNP in the last 8760 hours.  ProBNP (last 3 results) No results for input(s): PROBNP in the last 8760 hours.  CBG: Recent Labs  Lab 07/07/23 1625 07/07/23 2051 07/08/23 0814  GLUCAP 167* 167* 120*   No results found for this or any previous visit (from the past 240 hours).   Studies: No results found.    Anshul Meddings, MD  [facility] 07/08/2023  If 7PM-7AM, please contact night-coverage

## 2023-07-08 NOTE — Op Note (Addendum)
 [facility] Patient Name: Adrienne [name] Procedure Date: 07/08/2023 MRN: [id number] Attending MD: Aloha Finner , MD, [id number] Date of Birth: 19-May-1946 CSN: [id number] Age: 78 Admit Type: Inpatient Procedure:                ERCP Indications:              Biliary dilation on Computed Tomogram Scan,                            Abnormal MRCP, Jaundice, Elevated liver enzymes Providers:                Aloha Finner, MD, Burnard Fire RN, RN,                            Coye Mbumina, Technician Referring MD:             Onita Mattock Medicines:                General Anesthesia, Cipro  400 mg IV, Diclofenac  100                            mg rectal, Glucagon  0.75 mg IV Complications:            No immediate complications. Estimated Blood Loss:     Estimated blood loss was minimal. Procedure:                Pre-Anesthesia Assessment:                           - Prior to the procedure, a History and Physical                            was performed, and patient medications and                            allergies were reviewed. The patient's tolerance of                            previous anesthesia was also reviewed. The risks                            and benefits of the procedure and the sedation                            options and risks were discussed with the patient.                            All questions were answered, and informed consent                            was obtained. Prior Anticoagulants: The patient has                            taken no anticoagulant or antiplatelet agents. ASA  Grade Assessment: III - A patient with severe                            systemic disease. After reviewing the risks and                            benefits, the patient was deemed in satisfactory                            condition to undergo the procedure.                           After obtaining informed consent, the scope was                             passed under direct vision. Throughout the                            procedure, the patient's blood pressure, pulse, and                            oxygen saturations were monitored continuously. The                            [id number]-[id number] ([id number] Olympus duodenoscope was                            introduced through the mouth, and used to inject                            contrast into and used to inject contrast into the                            bile duct. The ERCP was accomplished without                            difficulty. The patient tolerated the procedure.                            Unclear reasoning, images were not saved to                            provation. Scope In: Scope Out: Findings:      The scout film was normal.      The esophagus was successfully intubated under direct vision without       detailed examination of the pharynx, larynx, and associated structures,       and upper GI tract. What appeared to be a small biliary sphincterotomy       had been performed. The sphincterotomy appeared open, no bile was       draining.      With a semilong position, a 0.035 inch x 260 cm straight Hydra Jagwire       was passed into the biliary tree, but it appeared to be moving into the  cystic duct region rather than going to the proximal intrahepatics a       superficial injection suggested that we were in a dilated portion of       what appeared to be the cystic duct rather than intrahepatics.       Subsequently a second 0.035 inch x 260 cm straight Hydra Jagwire was       passed into the intrahepatics of the biliary tree. The Hydratome       sphincterotome was passed over the guidewire and the bile duct was then       deeply cannulated. Contrast was injected. At this point I removed the       first wire that was in the cystic duct region. I personally interpreted       the bile duct images. Ductal flow of contrast was adequate. Image        quality was adequate. Contrast extended to the hepatic ducts.       Opacification of the entire biliary tree except for the gallbladder was       successful. The middle third of the main bile duct contained a single       severe stenosis 40 mm in length. The middle third of the main bile duct       and upper third of the main bile duct were diffusely dilated. The       largest diameter was 15 mm. The biliary sphincterotomy was extended with       a monofilament Hydratome sphincterotome using ERBE electrocautery. There       was no post-sphincterotomy bleeding. Cells for cytology were obtained by       brushing the biliary stenosis (2 separate cytology brushings were used).       One 10 Fr by 9 cm plastic biliary stent with a single external flap and       a single internal flap was placed into the common bile duct. The stent       was in good position.      A pancreatogram was not performed.      The duodenoscope was withdrawn from the patient. Impression:               - What appears to be a small biliary sphincterotomy                            appeared open.                           - Using a 2 wire technique, we were able to                            cannulate deeply the bile duct proximally.                           - A single severe biliary stricture was found in                            the middle third of the main bile duct. The                            stricture was malignant appearing. This was brushed  for cytology.                           - The upper third of the main bile duct and middle                            third of the main bile duct were dilated.                           - The previous biliary sphincterotomy was extended.                           - One plastic biliary stent was placed into the                            common bile duct to traverse the stenosis. Moderate Sedation:      Not Applicable - Patient had care per  Anesthesia. Recommendation:           - The patient will be observed post-procedure,                            until all discharge criteria are met.                           - Return patient to hospital ward for ongoing care.                           - Advance diet as tolerated.                           - Observe patient's clinical course.                           - Check liver enzymes (AST, ALT, alkaline                            phosphatase, bilirubin) in the morning. Expect that                            this may take a few weeks to normalize completely.                           - Watch for pancreatitis, bleeding, perforation,                            and cholangitis.                           - Ciprofloxacin  500 mg twice daily x 3 days in                            total, will defer to medicine service to consider  transitioning to oral when ready.                           - Hold heparin  VTE prophylaxis for 24 hours to                            decrease risk of post interventional bleeding.                           - Repeat ERCP in 6 months to exchange stent, if                            patient is not a surgical candidate and consider                            placement of UCSEMS versus FCSEMS.                           - Consider updated imaging to better visualize                            vasculature, SMA most importantly. Will defer this                            to oncology team.                           - The findings and recommendations were discussed                            with the patient.                           - The findings and recommendations were discussed                            with the referring physician. Procedure Code(s):        --- Professional ---                           [phone], Endoscopic retrograde                            cholangiopancreatography (ERCP); with placement of                             endoscopic stent into biliary or pancreatic duct,                            including pre- and post-dilation and guide wire                            passage, when performed, including sphincterotomy,  when performed, each stent                           [phone], Endoscopic catheterization of the biliary                            ductal system, radiological supervision and                            interpretation Diagnosis Code(s):        --- Professional ---                           K83.1, Obstruction of bile duct                           K83.8, Other specified diseases of biliary tract                           R17, Unspecified jaundice                           R74.8, Abnormal levels of other serum enzymes                           R93.2, Abnormal findings on diagnostic imaging of                            liver and biliary tract CPT copyright 2022 [employer]. All rights reserved. The codes documented in this report are preliminary and upon coder review may  be revised to meet current compliance requirements. Aloha Finner, MD 07/08/2023 2:56:48 PM Number of Addenda: 0

## 2023-07-08 NOTE — Progress Notes (Addendum)
 Pt presented to endo today for EUS/ERCP with MD Mansouraty. Post-operatively, pt noted to be hypertensive with SBP in the 220s. Pt slightly bradycardic with HR in the upper 50s. Pt denies pain, headache, or chest pain. [facility] Stoltzfus notified. VO for 5 mg of hydralazine  IV push. Medication given as ordered. MD Mansouraty assessed pt at bedside and is aware of hypertension.  Ozell VEAR Pouch, RN 07/08/23 3:12 PM  Addendum 1525: Post-hydralazine , pt's BP 161/92. [facility] Stoltzfus notified. Per [facility] Stoltzfus, okay to transfer back to inpt unit.  Ozell VEAR Pouch, RN 07/08/23 3:26 PM

## 2023-07-08 NOTE — Anesthesia Procedure Notes (Signed)
 Procedure Name: Intubation Date/Time: 07/08/2023 12:52 PM  Performed by: Cindie Charleen PARAS, CRNAPre-anesthesia Checklist: Patient identified, Emergency Drugs available, Suction available, Patient being monitored and Timeout performed Patient Re-evaluated:Patient Re-evaluated prior to induction Oxygen Delivery Method: Circle system utilized Preoxygenation: Pre-oxygenation with 100% oxygen Induction Type: IV induction Ventilation: Mask ventilation without difficulty Laryngoscope Size: Mac and 3 Grade View: Grade I Tube type: Oral Tube size: 7.0 mm Number of attempts: 1 Airway Equipment and Method: Stylet Placement Confirmation: ETT inserted through vocal cords under direct vision, positive ETCO2 and breath sounds checked- equal and bilateral Secured at: 21 cm Tube secured with: Tape Dental Injury: Teeth and Oropharynx as per pre-operative assessment

## 2023-07-08 NOTE — Anesthesia Preprocedure Evaluation (Addendum)
 Anesthesia Evaluation  Patient identified by MRN, date of birth, ID band Patient awake    Reviewed: Allergy & Precautions, H&P , NPO status , Patient's Chart, lab work & pertinent test results  Airway Mallampati: III  TM Distance: >3 FB Neck ROM: Full    Dental  (+) Teeth Intact, Dental Advisory Given   Pulmonary sleep apnea (mild OSA, no CPAP) , COPD (no inhalers), former smoker Quit smoking 2018, 32 pack year history    Pulmonary exam normal breath sounds clear to auscultation       Cardiovascular hypertension, Pt. on medications + Peripheral Vascular Disease and +CHF (grade 2 diastolic dysfunction, 45-50% LVEF)  Normal cardiovascular exam+ Valvular Problems/Murmurs (mild MR) MR  Rhythm:Regular Rate:Normal  Echo 08/2022  1. Left ventricular ejection fraction, by estimation, is 45 to 50%. The  left ventricle has mildly decreased function. The left ventricle  demonstrates global hypokinesis. Left ventricular diastolic parameters are  consistent with Grade II diastolic dysfunction (pseudonormalization).   2. Right ventricular systolic function is normal. The right ventricular  size is normal.   3. Left atrial size was moderately dilated.   4. The mitral valve is normal in structure. Mild mitral valve  regurgitation. No evidence of mitral stenosis.   5. The aortic valve is tricuspid. Aortic valve regurgitation is not  visualized. Aortic valve sclerosis is present, with no evidence of aortic  valve stenosis.   6. Aortic dilatation noted. There is borderline dilatation of the  ascending aorta, measuring 38 mm.   7. The inferior vena cava is normal in size with greater than 50%  respiratory variability, suggesting right atrial pressure of 3 mmHg.     Neuro/Psych negative neurological ROS  negative psych ROS   GI/Hepatic ,GERD  Controlled,,Pancreatic cancer, obstructive jaundice   Endo/Other  diabetes, Well Controlled, Type 2,  Oral Hypoglycemic AgentsHypothyroidism    Renal/GU negative Renal ROS  negative genitourinary   Musculoskeletal  (+) Arthritis , Osteoarthritis,    Abdominal   Peds negative pediatric ROS (+)  Hematology negative hematology ROS (+) Hb 11.2   Anesthesia Other Findings   Reproductive/Obstetrics negative OB ROS                              Anesthesia Physical Anesthesia Plan  ASA: 3  Anesthesia Plan: General   Post-op Pain Management:    Induction: Intravenous  PONV Risk Score and Plan: 3 and Ondansetron , Dexamethasone  and Treatment may vary due to age or medical condition  Airway Management Planned: Oral ETT  Additional Equipment: None  Intra-op Plan:   Post-operative Plan: Extubation in OR  Informed Consent: I have reviewed the patients History and Physical, chart, labs and discussed the procedure including the risks, benefits and alternatives for the proposed anesthesia with the patient or authorized representative who has indicated his/her understanding and acceptance.     Dental advisory given  Plan Discussed with: CRNA  Anesthesia Plan Comments:          Anesthesia Quick Evaluation

## 2023-07-08 NOTE — Hospital Course (Addendum)
 Brief Narrative:  78 y.o. female with medical history significant of chronic systolic congestive heart failure, ductal carcinoma in situ, hyperlipidemia, hypertension, hypothyroidism, small bowel obstruction, sleep apnea not on CPAP, tobacco use in remission, type 2 diabetes who was recently admitted hospital for pancreatic mass and obstructive jaundice but with 2 failed attempts at EUS and biliary stent presented to hospital with increasing bilirubin levels from 6.5 to 8.6 mg/dL in the last 4 days.  Creatinine from 07/07/2023 was 1.3.  CBC was unremarkable with normal PT and INR.[name]  Patient was referred by her oncologist for admission and was admitted for third ERCP attempt with Dr. Wilhelmenia, GI.  Eventually went MRCP 1/14, biliary stricture was noted which was likely malignant, biopsies were performed.  1 plastic biliary stent was placed. Today patient is doing well and would like to be discharged.  Assessment & Plan:  Principal Problem:   Pancreatic mass Active Problems:   Hypertension   HLD (hyperlipidemia)   Chronic systolic CHF (congestive heart failure) (HCC)   Hypothyroidism   Glaucoma of both eyes   ILD (interstitial lung disease) (HCC)   Type 2 diabetes mellitus with other specified complication (HCC)   Obstructive jaundice   GERD (gastroesophageal reflux disease)   Gastritis and gastroduodenitis   Elevated serum creatinine       Pancreatic mass leading to  Obstructive jaundice Transaminitis Underwent another ERCP on 1/14, noted to have what appeared to be malignant stricture.  Biopsies were performed.  Plastic biliary stent was placed.  Currently recommending Cipro  500 mg twice daily for 3 days Expect several days for transaminitis and elevated total bilirubin to normalize. Follow-up outpatient Dr. Lanny from Onc.      Type 2 diabetes mellitus with other specified complication (HCC) Continue sliding scale insulin  Accu-Cheks diabetic diet, long-acting insulin .  Adjust as  necessary.     GERD (gastroesophageal reflux disease)   Gastritis and gastroduodenitis Continue Pepcid      Hypertension Losartan    hyperlipidemia No statin given abnormal LFTs.   Hypokalemia.   As needed repletion   Mild AKI.   Baseline creatinine 0.6, peaked 1.33.  Now resolved     Chronic systolic CHF (congestive heart failure) (HCC) Will home upon discharge     Hypothyroidism Continue thyroid  Armour.     Glaucoma of both eyes Not on eyedrops.  Follow-up with pulmonary as outpatient.     ILD (interstitial lung disease)  Bronchodilators as needed.  DVT prophylaxis: SCDs Start: 07/07/23 1142    Code Status: Full Code Family Communication: Daughter [name] updated Status is: Inpatient Remains inpatient appropriate because: Patient is doing well no complaints at this time.    Subjective: Doing well no complaints. Patient would like to be discharged.  Examination:  General exam: Appears calm and comfortable  Respiratory system: Clear to auscultation. Respiratory effort normal. Cardiovascular system: S1 & S2 heard, RRR. No JVD, murmurs, rubs, gallops or clicks. No pedal edema. Gastrointestinal system: Abdomen is nondistended, soft and nontender. No organomegaly or masses felt. Normal bowel sounds heard. Central nervous system: Alert and oriented. No focal neurological deficits. Extremities: Symmetric 5 x 5 power. Skin: No rashes, lesions or ulcers Psychiatry: Judgement and insight appear normal. Mood & affect appropriate.

## 2023-07-08 NOTE — Op Note (Signed)
 [facility] Patient Name: Adrienne [name] Procedure Date: 07/08/2023 MRN: [id number] Attending MD: Aloha Finner , MD, [id number] Date of Birth: Nov 10, 1945 CSN: [id number] Age: 78 Admit Type: Inpatient Procedure:                Upper EUS Indications:              Common bile duct dilation (acquired) seen on CT                            scan, Dilated pancreatic duct on CT scan, Suspected                            mass in pancreas on MRCP, Elevated liver enzymes Providers:                Aloha Finner, MD, Burnard Fire RN, RN,                            Coye Mbumina, Technician Referring MD:             Onita Mattock Medicines:                General Anesthesia Complications:            No immediate complications. Estimated Blood Loss:     Estimated blood loss was minimal. Procedure:                Pre-Anesthesia Assessment:                           - Prior to the procedure, a History and Physical                            was performed, and patient medications and                            allergies were reviewed. The patient's tolerance of                            previous anesthesia was also reviewed. The risks                            and benefits of the procedure and the sedation                            options and risks were discussed with the patient.                            All questions were answered, and informed consent                            was obtained. Prior Anticoagulants: The patient has                            taken no anticoagulant or antiplatelet agents. ASA  Grade Assessment: III - A patient with severe                            systemic disease. After reviewing the risks and                            benefits, the patient was deemed in satisfactory                            condition to undergo the procedure.                           After obtaining informed consent, the endoscope was                             passed under direct vision. Throughout the                            procedure, the patient's blood pressure, pulse, and                            oxygen saturations were monitored continuously. The                            [id number]-H190 ([id number]) Olympus endoscope was introduced                            through the mouth, and advanced to the second part                            of duodenum. The [id number]-[id number] ([id number]) Olympus                            duodenoscope was introduced through the mouth, and                            advanced to the area of papilla. The GF-[id number]                            ([id number]) Olympus linear ultrasound scope was                            introduced through the mouth, and advanced to the                            duodenum for ultrasound examination from the                            stomach and duodenum. The upper EUS was                            accomplished without difficulty. The patient  tolerated the procedure. Scope In: Scope Out: Findings:      ENDOSCOPIC FINDING: :      No gross lesions were noted in the entire esophagus.      The Z-line was irregular and was found 40 cm from the incisors.      A 1 cm hiatal hernia was present.      Multiple dispersed small erosions with no bleeding and no stigmata of       recent bleeding were found in the entire examined stomach. Biopsies were       taken with a cold forceps for histology and Helicobacter pylori testing.      No gross lesions were noted in the duodenal bulb, in the first portion       of the duodenum and in the second portion of the duodenum.      The major papilla was normal.      ENDOSONOGRAPHIC FINDING: :      An irregular mass was identified in the pancreatic head. The mass was       hypoechoic. The mass measured 28 mm by 21 mm in maximal cross-sectional       diameter. The endosonographic borders were well-defined. There was        sonographic evidence suggesting abutment of the portal vein and       invasions of the superior mesenteric vein. An intact interface was seen       between the mass and the celiac trunk suggesting a lack of invasion.       Difficult to visualize the superior mesenteric trunk based on patient's       anatomy (so I cannot comment on this completely). The remainder of the       pancreas was examined. The endosonographic appearance of parenchyma and       the upstream pancreatic duct indicated duct dilation and parenchymal       atrophy. Fine needle biopsy was performed. Color Doppler imaging was       utilized prior to needle puncture to confirm a lack of significant       vascular structures within the needle path. Six passes were made with       the 22 gauge Acquire biopsy needle using a transduodenal approach. A       visible core of tissue was obtained. Preliminary cytologic examination       and touch preps were performed. The cellularity of the specimen was       adequate. Final cytology results are pending.      There was a small distance of normal appearing distal CBD (2.3 mm) and       then with the mass lesion leading to dilation of the common bile duct,       in the common hepatic duct and diffusely throughout the intrahepatic       bile duct(s).      Endosonographic imaging of the ampulla showed no intramural       (subepithelial) lesion.      Endosonographic imaging in the visualized portion of the liver showed no       mass.      No malignant-appearing lymph nodes were visualized in the celiac region       (level 20), peripancreatic region and porta hepatis region.      The celiac region was visualized. Impression:               EGD Impression:                           -  No gross lesions in the entire esophagus. Z-line                            irregular, 40 cm from the incisors.                           - 1 cm hiatal hernia.                           - Erosive gastropathy  with no bleeding and no                            stigmata of recent bleeding. Biopsied.                           - No gross lesions in the duodenal bulb, in the                            first portion of the duodenum and in the second                            portion of the duodenum.                           - Normal major papilla.                           EUS Impression:                           - A mass was identified in the pancreatic head.                            Cytology results are pending. However, the                            endosonographic appearance is suspicious for                            adenocarcinoma. This was staged T2 N0 Mx by                            endosonographic criteria. The staging applies if                            malignancy is confirmed (with the caveat that I                            cannot visualize the superior mesenteric artery                            completely). Fine needle biopsy performed.                           - There was dilation in the  common bile duct, in                            the common hepatic duct and in the intrahepatic                            bile ducts, diffusely.                           - No malignant-appearing lymph nodes were                            visualized in the celiac region (level 20),                            peripancreatic region and porta hepatis region. Moderate Sedation:      Not Applicable - Patient had care per Anesthesia. Recommendation:           - Proceed to scheduled ERCP.                           - Observe patient's clinical course.                           - Await cytology results and await path results.                           - The findings and recommendations were discussed                            with the patient.                           - The findings and recommendations were discussed                            with the referring physician. Procedure Code(s):         --- Professional ---                           [phone], Esophagogastroduodenoscopy, flexible,                            transoral; with transendoscopic ultrasound-guided                            intramural or transmural fine needle                            aspiration/biopsy(s), (includes endoscopic                            ultrasound examination limited to the esophagus,                            stomach or duodenum, and adjacent structures)  [id number], 59, Esophagogastroduodenoscopy, flexible,                            transoral; with biopsy, single or multiple Diagnosis Code(s):        --- Professional ---                           K22.89, Other specified disease of esophagus                           K44.9, Diaphragmatic hernia without obstruction or                            gangrene                           K31.89, Other diseases of stomach and duodenum                           K86.89, Other specified diseases of pancreas                           K83.8, Other specified diseases of biliary tract                           I89.9, Noninfective disorder of lymphatic vessels                            and lymph nodes, unspecified                           R74.8, Abnormal levels of other serum enzymes                           R93.3, Abnormal findings on diagnostic imaging of                            other parts of digestive tract CPT copyright 2022 [employer]. All rights reserved. The codes documented in this report are preliminary and upon coder review may  be revised to meet current compliance requirements. Aloha Finner, MD 07/08/2023 2:43:54 PM Number of Addenda: 0

## 2023-07-08 NOTE — Transfer of Care (Signed)
 Immediate Anesthesia Transfer of Care Note  Patient: Adrienne [name]  Procedure(s) Performed: ESOPHAGOGASTRODUODENOSCOPY (EGD) UPPER ENDOSCOPIC ULTRASOUND (EUS) LINEAR ENDOSCOPIC RETROGRADE CHOLANGIOPANCREATOGRAPHY (ERCP) FINE NEEDLE ASPIRATION (FNA) LINEAR BIOPSY BILIARY BRUSHING BILIARY STENT PLACEMENT SPHINCTEROTOMY  Patient Location: PACU  Anesthesia Type:General  Level of Consciousness: sedated, patient cooperative, and responds to stimulation  Airway & Oxygen Therapy: Patient Spontanous Breathing and Patient connected to face mask oxygen  Post-op Assessment: Report given to RN and Post -op Vital signs reviewed and stable  Post vital signs: Reviewed and stable  Last Vitals:  Vitals Value Taken Time  BP    Temp    Pulse    Resp    SpO2      Last Pain:  Vitals:   07/08/23 1200  TempSrc: Temporal  PainSc: 0-No pain         Complications: No notable events documented.

## 2023-07-08 NOTE — Anesthesia Postprocedure Evaluation (Signed)
 Anesthesia Post Note  Patient: Adrienne [name]  Procedure(s) Performed: ESOPHAGOGASTRODUODENOSCOPY (EGD) UPPER ENDOSCOPIC ULTRASOUND (EUS) LINEAR ENDOSCOPIC RETROGRADE CHOLANGIOPANCREATOGRAPHY (ERCP) FINE NEEDLE ASPIRATION (FNA) LINEAR BIOPSY BILIARY BRUSHING BILIARY STENT PLACEMENT SPHINCTEROTOMY     Patient location during evaluation: Endoscopy Anesthesia Type: General Level of consciousness: awake and alert Pain management: pain level controlled Vital Signs Assessment: post-procedure vital signs reviewed and stable Respiratory status: spontaneous breathing, nonlabored ventilation, respiratory function stable and patient connected to nasal cannula oxygen Cardiovascular status: blood pressure returned to baseline and stable Postop Assessment: no apparent nausea or vomiting Anesthetic complications: no   No notable events documented.  Last Vitals:  Vitals:   07/08/23 1530 07/08/23 1609  BP: (!) 156/93 (!) 179/81  Pulse: 69 71  Resp: 13 18  Temp:  36.5 C  SpO2: 98% 94%    Last Pain:  Vitals:   07/08/23 1725  TempSrc:   PainSc: 3                  Thom JONELLE Peoples

## 2023-07-08 NOTE — Interval H&P Note (Signed)
 History and Physical Interval Note:  07/08/2023 12:17 PM  Adrienne [name]  has presented today for surgery, with the diagnosis of pancreatic cancer, obstructive jaundice.  The various methods of treatment have been discussed with the patient and family. After consideration of risks, benefits and other options for treatment, the patient has consented to  Procedure(s): ESOPHAGOGASTRODUODENOSCOPY (EGD) (N/A) UPPER ENDOSCOPIC ULTRASOUND (EUS) LINEAR (N/A) ENDOSCOPIC RETROGRADE CHOLANGIOPANCREATOGRAPHY (ERCP) (N/A) as a surgical intervention.  The patient's history has been reviewed, patient examined, no change in status, stable for surgery.  I have reviewed the patient's chart and labs.  Questions were answered to the patient's satisfaction.    The risks of an EUS including intestinal perforation, bleeding, infection, aspiration, and medication effects were discussed as was the possibility it may not give a definitive diagnosis if a biopsy is performed.  When a biopsy of the pancreas is done as part of the EUS, there is an additional risk of pancreatitis at the rate of about 1-2%.  It was explained that procedure related pancreatitis is typically mild, although it can be severe and even life threatening, which is why we do not perform random pancreatic biopsies and only biopsy a lesion/area we feel is concerning enough to warrant the risk.   The risks of an ERCP were discussed at length, including but not limited to the risk of perforation, bleeding, abdominal pain, post-ERCP pancreatitis (while usually mild can be severe and even life threatening).     Micky Overturf Mansouraty Jr

## 2023-07-09 ENCOUNTER — Other Ambulatory Visit: Payer: Self-pay | Admitting: *Deleted

## 2023-07-09 ENCOUNTER — Telehealth: Payer: Self-pay

## 2023-07-09 DIAGNOSIS — K838 Other specified diseases of biliary tract: Secondary | ICD-10-CM | POA: Diagnosis not present

## 2023-07-09 DIAGNOSIS — R7989 Other specified abnormal findings of blood chemistry: Secondary | ICD-10-CM

## 2023-07-09 DIAGNOSIS — K831 Obstruction of bile duct: Secondary | ICD-10-CM | POA: Diagnosis not present

## 2023-07-09 DIAGNOSIS — K8689 Other specified diseases of pancreas: Secondary | ICD-10-CM | POA: Diagnosis not present

## 2023-07-09 DIAGNOSIS — C240 Malignant neoplasm of extrahepatic bile duct: Secondary | ICD-10-CM | POA: Diagnosis not present

## 2023-07-09 LAB — CBC
HCT: 31.4 % — ABNORMAL LOW (ref 36.0–46.0)
Hemoglobin: 10.5 g/dL — ABNORMAL LOW (ref 12.0–15.0)
MCH: 30.5 pg (ref 26.0–34.0)
MCHC: 33.4 g/dL (ref 30.0–36.0)
MCV: 91.3 fL (ref 80.0–100.0)
Platelets: 211 10*3/uL (ref 150–400)
RBC: 3.44 MIL/uL — ABNORMAL LOW (ref 3.87–5.11)
RDW: 15.9 % — ABNORMAL HIGH (ref 11.5–15.5)
WBC: 5.8 10*3/uL (ref 4.0–10.5)
nRBC: 0 % (ref 0.0–0.2)

## 2023-07-09 LAB — GLUCOSE, CAPILLARY
Glucose-Capillary: 142 mg/dL — ABNORMAL HIGH (ref 70–99)
Glucose-Capillary: 174 mg/dL — ABNORMAL HIGH (ref 70–99)

## 2023-07-09 LAB — MAGNESIUM: Magnesium: 1.9 mg/dL (ref 1.7–2.4)

## 2023-07-09 LAB — LIPASE, BLOOD: Lipase: 58 U/L — ABNORMAL HIGH (ref 11–51)

## 2023-07-09 LAB — COMPREHENSIVE METABOLIC PANEL
ALT: 284 U/L — ABNORMAL HIGH (ref 0–44)
AST: 197 U/L — ABNORMAL HIGH (ref 15–41)
Albumin: 2.5 g/dL — ABNORMAL LOW (ref 3.5–5.0)
Alkaline Phosphatase: 816 U/L — ABNORMAL HIGH (ref 38–126)
Anion gap: 10 (ref 5–15)
BUN: <5 mg/dL — ABNORMAL LOW (ref 8–23)
CO2: 18 mmol/L — ABNORMAL LOW (ref 22–32)
Calcium: 8.1 mg/dL — ABNORMAL LOW (ref 8.9–10.3)
Chloride: 108 mmol/L (ref 98–111)
Creatinine, Ser: 0.67 mg/dL (ref 0.44–1.00)
GFR, Estimated: >60 mL/min (ref >60)
Glucose, Bld: 147 mg/dL — ABNORMAL HIGH (ref 70–99)
Potassium: 3.7 mmol/L (ref 3.5–5.1)
Sodium: 136 mmol/L (ref 135–145)
Total Bilirubin: 4.8 mg/dL — ABNORMAL HIGH (ref 0.0–1.2)
Total Protein: 5.5 g/dL — ABNORMAL LOW (ref 6.5–8.1)

## 2023-07-09 MED ORDER — SENNOSIDES-DOCUSATE SODIUM 8.6-50 MG PO TABS: 1.0000 | ORAL_TABLET | Freq: Every evening | ORAL | Status: DC | PRN

## 2023-07-09 MED ORDER — POTASSIUM CHLORIDE CRYS ER 20 MEQ PO TBCR
40.0000 meq | EXTENDED_RELEASE_TABLET | Freq: Once | ORAL | Status: AC
  Administered 2023-07-09: 40 meq via ORAL
  Filled 2023-07-09: qty 2

## 2023-07-09 MED ORDER — IPRATROPIUM-ALBUTEROL 0.5-2.5 (3) MG/3ML IN SOLN: 3.0000 mL | RESPIRATORY_TRACT | Status: DC | PRN

## 2023-07-09 MED ORDER — CIPROFLOXACIN HCL 500 MG PO TABS: 500.0000 mg | ORAL_TABLET | Freq: Two times a day (BID) | ORAL | 0 refills | Status: AC

## 2023-07-09 MED ORDER — ACETAMINOPHEN 325 MG PO TABS: 650.0000 mg | ORAL_TABLET | Freq: Four times a day (QID) | ORAL | Status: DC | PRN

## 2023-07-09 MED ORDER — CIPROFLOXACIN HCL 500 MG PO TABS: 500.0000 mg | ORAL_TABLET | Freq: Two times a day (BID) | ORAL | Status: DC

## 2023-07-09 NOTE — Telephone Encounter (Signed)
Recall has been entered as ordered  

## 2023-07-09 NOTE — Plan of Care (Signed)
  Problem: Education: Goal: Knowledge of General Education information will improve Description: Including pain rating scale, medication(s)/side effects and non-pharmacologic comfort measures Outcome: Progressing   Problem: Health Behavior/Discharge Planning: Goal: Ability to manage health-related needs will improve Outcome: Progressing   Problem: Clinical Measurements: Goal: Ability to maintain clinical measurements within normal limits will improve Outcome: Progressing Goal: Will remain free from infection Outcome: Progressing Goal: Diagnostic test results will improve Outcome: Progressing Goal: Respiratory complications will improve Outcome: Progressing Goal: Cardiovascular complication will be avoided Outcome: Progressing   Problem: Activity: Goal: Risk for activity intolerance will decrease Outcome: Progressing   Problem: Nutrition: Goal: Adequate nutrition will be maintained Outcome: Progressing   Problem: Coping: Goal: Level of anxiety will decrease Outcome: Progressing   Problem: Elimination: Goal: Will not experience complications related to bowel motility Outcome: Progressing Goal: Will not experience complications related to urinary retention Outcome: Progressing   Problem: Pain Management: Goal: General experience of comfort will improve Outcome: Progressing   Problem: Safety: Goal: Ability to remain free from injury will improve Outcome: Progressing   Problem: Skin Integrity: Goal: Risk for impaired skin integrity will decrease Outcome: Progressing   Problem: Education: Goal: Ability to describe self-care measures that may prevent or decrease complications (Diabetes Survival Skills Education) will improve Outcome: Progressing Goal: Individualized Educational Video(s) Outcome: Progressing   Problem: Coping: Goal: Ability to adjust to condition or change in health will improve Outcome: Progressing   Problem: Fluid Volume: Goal: Ability to  maintain a balanced intake and output will improve Outcome: Progressing   Problem: Health Behavior/Discharge Planning: Goal: Ability to identify and utilize available resources and services will improve Outcome: Progressing Goal: Ability to manage health-related needs will improve Outcome: Progressing   Problem: Metabolic: Goal: Ability to maintain appropriate glucose levels will improve Outcome: Progressing   Problem: Nutritional: Goal: Progress toward achieving an optimal weight will improve Outcome: Progressing   Problem: Skin Integrity: Goal: Risk for impaired skin integrity will decrease Outcome: Progressing   Problem: Tissue Perfusion: Goal: Adequacy of tissue perfusion will improve Outcome: Progressing

## 2023-07-09 NOTE — Consult Note (Signed)
 [facility] Liaison Consult Note   07/09/2023  Adrienne [name] [facility] [birth year] [id number]  Insurance: [employer]   Primary Care Provider: [name], MD, [facility], this provider is listed for the transition of care follow up appointments  and TOC follow up calls   [facility] Liaison screened the patient remotely at [facility]. Spoke with patient via hospital telephone operator assisted. HIPAA verified. Explained reason for call and to anticipate VBCI RN TOC for follow up.    The patient was screened for 30 day readmission hospitalization with noted low risk score for unplanned readmission risk 3 hospital admissions in 6 months.  The patient was assessed for potential Community Care Coordination service needs for post hospital transition for care coordination. Review of patient's electronic medical record reveals patient is for home.   Plan: [facility] Liaison will continue to follow progress and disposition to asess for post hospital community care coordination needs.  Referral request for community care coordination: Patient to anticipate a follow up call with team.  She denies any other needs at this time for Care Coordination.   [facility] Community Care, Population Health does not replace or interfere with any arrangements made by the Inpatient Transition of Care team.   For questions contact:   [place], RN, BSN, CCM [place]  [facility], [facility] Liaison Direct Dial: [phone] or secure chat Email: [email or web address]

## 2023-07-09 NOTE — Progress Notes (Signed)
 The proposed treatment discussed in conference is for discussion purpose only and is not a binding recommendation.  The patients have not been physically examined, or presented with their treatment options.  Therefore, final treatment plans cannot be decided.

## 2023-07-09 NOTE — Telephone Encounter (Signed)
-----   Message from [facility] sent at 07/08/2023  5:30 PM EST ----- Regarding: ERCP recall Adrienne [name], Place ERCP recall for 6 months for stent exchange. Thanks. GM

## 2023-07-09 NOTE — Discharge Summary (Signed)
 Physician Discharge Summary  Adrienne [name] ZOX:[id number] DOB: 1946/02/20 DOA: 07/07/2023  PCP: [name], MD  Admit date: 07/07/2023 Discharge date: 07/09/2023  Admitted From: Home Disposition:  Home  Recommendations for Outpatient Follow-up:  Follow up with PCP in 1-2 weeks Please obtain BMP/CBC in one week your next doctors visit.  Oral Cipro  for 2 more days Outpatient follow-up with oncology   Discharge Condition: Stable CODE STATUS: Full code Diet recommendation: Heart healthy  Brief/Interim Summary: Brief Narrative:  78 y.o. female with medical history significant of chronic systolic congestive heart failure, ductal carcinoma in situ, hyperlipidemia, hypertension, hypothyroidism, small bowel obstruction, sleep apnea not on CPAP, tobacco use in remission, type 2 diabetes who was recently admitted hospital for pancreatic mass and obstructive jaundice but with 2 failed attempts at EUS and biliary stent presented to hospital with increasing bilirubin levels from 6.5 to 8.6 mg/dL in the last 4 days.  Creatinine from 07/07/2023 was 1.3.  CBC was unremarkable with normal PT and INR.Adrienne [name]  Patient was referred by her oncologist for admission and was admitted for third ERCP attempt with Dr. Brice Campi, GI.  Eventually went MRCP 1/14, biliary stricture was noted which was likely malignant, biopsies were performed.  1 plastic biliary stent was placed. Today patient is doing well and would like to be discharged.  Assessment & Plan:  Principal Problem:   Pancreatic mass Active Problems:   Hypertension   HLD (hyperlipidemia)   Chronic systolic CHF (congestive heart failure) (HCC)   Hypothyroidism   Glaucoma of both eyes   ILD (interstitial lung disease) (HCC)   Type 2 diabetes mellitus with other specified complication (HCC)   Obstructive jaundice   GERD (gastroesophageal reflux disease)   Gastritis and gastroduodenitis   Elevated serum creatinine       Pancreatic mass  leading to  Obstructive jaundice Transaminitis Underwent another ERCP on 1/14, noted to have what appeared to be malignant stricture.  Biopsies were performed.  Plastic biliary stent was placed.  Currently recommending Cipro  500 mg twice daily for 3 days Expect several days for transaminitis and elevated total bilirubin to normalize. Follow-up outpatient Dr. Maryalice Smaller from Onc.      Type 2 diabetes mellitus with other specified complication (HCC) Continue sliding scale insulin  Accu-Cheks diabetic diet, long-acting insulin .  Adjust as necessary.     GERD (gastroesophageal reflux disease)   Gastritis and gastroduodenitis Continue Pepcid      Hypertension Losartan    hyperlipidemia No statin given abnormal LFTs.   Hypokalemia.   As needed repletion   Mild AKI.   Baseline creatinine 0.6, peaked 1.33.  Now resolved     Chronic systolic CHF (congestive heart failure) (HCC) Will home upon discharge     Hypothyroidism Continue thyroid  Armour.     Glaucoma of both eyes Not on eyedrops.  Follow-up with pulmonary as outpatient.     ILD (interstitial lung disease)  Bronchodilators as needed.  DVT prophylaxis: SCDs Start: 07/07/23 1142    Code Status: Full Code Family Communication: Daughter [name] updated Status is: Inpatient Remains inpatient appropriate because: Patient is doing well no complaints at this time.    Subjective: Doing well no complaints. Patient would like to be discharged.  Examination:  General exam: Appears calm and comfortable  Respiratory system: Clear to auscultation. Respiratory effort normal. Cardiovascular system: S1 & S2 heard, RRR. No JVD, murmurs, rubs, gallops or clicks. No pedal edema. Gastrointestinal system: Abdomen is nondistended, soft and nontender. No organomegaly or masses felt. Normal bowel sounds  heard. Central nervous system: Alert and oriented. No focal neurological deficits. Extremities: Symmetric 5 x 5 power. Skin: No rashes, lesions  or ulcers Psychiatry: Judgement and insight appear normal. Mood & affect appropriate.    Discharge Diagnoses:  Principal Problem:   Pancreatic mass Active Problems:   Hypertension   HLD (hyperlipidemia)   Chronic systolic CHF (congestive heart failure) (HCC)   Hypothyroidism   Glaucoma of both eyes   ILD (interstitial lung disease) (HCC)   Type 2 diabetes mellitus with other specified complication (HCC)   Obstructive jaundice   GERD (gastroesophageal reflux disease)   Gastritis and gastroduodenitis   Elevated serum creatinine      Discharge Exam: Vitals:   07/09/23 0509 07/09/23 0512  BP: 139/66 139/66  Pulse: (!) 53   Resp: 18   Temp: 98.8 F (37.1 C)   SpO2: 93%    Vitals:   07/08/23 2034 07/08/23 2207 07/09/23 0509 07/09/23 0512  BP: (!) 156/69 (!) 156/69 139/66 139/66  Pulse: (!) 51  (!) 53   Resp: 15  18   Temp: 98.4 F (36.9 C)  98.8 F (37.1 C)   TempSrc: Oral  Oral   SpO2: 96%  93%   Weight:      Height:          Discharge Instructions   Allergies as of 07/09/2023       Reactions   Lisinopril  Swelling   Angioedema   Statins    Myalgias   Atorvastatin  Other (See Comments)   Muscle aches         Medication List     TAKE these medications    Basaglar  KwikPen 100 UNIT/ML Inject 12 Units into the skin daily. May substitute as needed per insurance. What changed:  when to take this additional instructions   celecoxib  100 MG capsule Commonly known as: CELEBREX  Take 100 mg by mouth 2 (two) times daily.   cetirizine  10 MG tablet Commonly known as: ZYRTEC  Take 1 tablet (10 mg total) by mouth daily.   ciprofloxacin  500 MG tablet Commonly known as: CIPRO  Take 1 tablet (500 mg total) by mouth 2 (two) times daily for 2 days.   famotidine  20 MG tablet Commonly known as: PEPCID  Take 1 tablet (20 mg total) by mouth 2 (two) times daily. What changed: when to take this   fluticasone  50 MCG/ACT nasal spray Commonly known as:  FLONASE  Place 2 sprays into both nostrils daily.   insulin  aspart 100 UNIT/ML FlexPen Commonly known as: NOVOLOG  Inject 0-6 Units into the skin 3 (three) times daily with meals. Check Blood Glucose (BG) and inject per scale: BG <150= 0 unit; BG 150-200= 1 unit; BG 201-250= 2 unit; BG 251-300= 3 unit; BG 301-350= 4 unit; BG 351-400= 5 unit; BG >400= 6 unit and Call Primary Care.   losartan  100 MG tablet Commonly known as: COZAAR  Take 1 tablet by mouth once daily   metFORMIN  500 MG 24 hr tablet Commonly known as: GLUCOPHAGE -XR Take 500 mg by mouth daily with breakfast.   thyroid  90 MG tablet Commonly known as: NP Thyroid  Take 1 tablet (90 mg total) by mouth daily.   Vitamin D -3 125 MCG (5000 UT) Tabs Take 1 tablet by mouth daily.        Follow-up Information     [name], MD Follow up.   Specialty: Internal Medicine Contact information: [address] [place] [address] [phone]  Allergies  Allergen Reactions   Lisinopril  Swelling    Angioedema   Statins     Myalgias   Atorvastatin  Other (See Comments)    Muscle aches     You were cared for by a hospitalist during your hospital stay. If you have any questions about your discharge medications or the care you received while you were in the hospital after you are discharged, you can call the unit and asked to speak with the hospitalist on call if the hospitalist that took care of you is not available. Once you are discharged, your primary care physician will handle any further medical issues. Please note that no refills for any discharge medications will be authorized once you are discharged, as it is imperative that you return to your primary care physician (or establish a relationship with a primary care physician if you do not have one) for your aftercare needs so that they can reassess your need for medications and monitor your lab values.  You were cared for by a hospitalist during  your hospital stay. If you have any questions about your discharge medications or the care you received while you were in the hospital after you are discharged, you can call the unit and asked to speak with the hospitalist on call if the hospitalist that took care of you is not available. Once you are discharged, your primary care physician will handle any further medical issues. Please note that NO REFILLS for any discharge medications will be authorized once you are discharged, as it is imperative that you return to your primary care physician (or establish a relationship with a primary care physician if you do not have one) for your aftercare needs so that they can reassess your need for medications and monitor your lab values.  Please request your Prim.MD to go over all Hospital Tests and Procedure/Radiological results at the follow up, please get all Hospital records sent to your Prim MD by signing hospital release before you go home.  Get CBC, CMP, 2 view Chest X ray checked  by Primary MD during your next visit or SNF MD in 5-7 days ( we routinely change or add medications that can affect your baseline labs and fluid status, therefore we recommend that you get the mentioned basic workup next visit with your PCP, your PCP may decide not to get them or add new tests based on their clinical decision)  On your next visit with your primary care physician please Get Medicines reviewed and adjusted.  If you experience worsening of your admission symptoms, develop shortness of breath, life threatening emergency, suicidal or homicidal thoughts you must seek medical attention immediately by calling 911 or calling your MD immediately  if symptoms less severe.  You Must read complete instructions/literature along with all the possible adverse reactions/side effects for all the Medicines you take and that have been prescribed to you. Take any new Medicines after you have completely understood and accpet all the  possible adverse reactions/side effects.   Do not drive, operate heavy machinery, perform activities at heights, swimming or participation in water  activities or provide baby sitting services if your were admitted for syncope or siezures until you have seen by Primary MD or a Neurologist and advised to do so again.  Do not drive when taking Pain medications.   Procedures/Studies: DG ERCP Result Date: 07/09/2023 CLINICAL DATA:  Malignant biliary stricture EXAM: ERCP TECHNIQUE: Multiple spot images obtained with the fluoroscopic device and submitted for interpretation post-procedure.  FLUOROSCOPY: Radiation Exposure Index (as provided by the fluoroscopic device): 67.31 mGy Kerma COMPARISON:  Prior ERCP 06/27/2023; MRCP 06/14/2023 FINDINGS: A total of 16 intraoperative spot images are submitted for review. The images demonstrate a flexible duodenal scope in the descending duodenum with wire cannulation of the biliary system. Cholangiography demonstrates intra and extrahepatic biliary ductal dilatation with what appears to be complete occlusion of the mid and distal common bile duct. On the final images, a plastic biliary stent has been placed. IMPRESSION: ERCP with placement of plastic biliary stent as above. These images were submitted for radiologic interpretation only. Please see the procedural report for the amount of contrast and the fluoroscopy time utilized. Electronically Signed   By: Fernando Hoyer M.D.   On: 07/09/2023 07:49   DG Abd 1 View Result Date: 07/03/2023 CLINICAL DATA:  Assess pancreatic duct stent-placed on 06/14/2023 EXAM: ABDOMEN - 1 VIEW COMPARISON:  June 13, 2020 FINDINGS: Biliary stent projects over the RIGHT lower quadrant and has been displaced from the common bile duct. Air and stool filled nondilated loops of bowel. Status post bilateral hip arthroplasty. Degenerative changes of the lumbar spine. IMPRESSION: Biliary stent projects over the RIGHT lower quadrant and has been  displaced from the common bile duct. Electronically Signed   By: Clancy Crimes M.D.   On: 07/03/2023 14:46   DG ERCP Result Date: 06/28/2023 CLINICAL DATA:  Elective surgery Fever Acalculous cholecystitis EXAM: ERCP TECHNIQUE: Multiple spot images obtained with the fluoroscopic device and submitted for interpretation post-procedure. FLUOROSCOPY: Radiation Exposure Index (as provided by the fluoroscopic device): 23.2 mGy Kerma COMPARISON:  06/14/2023 FINDINGS: Three intraoperative fluoroscopic images were provided for interpretation. Pancreatic duct stent is again seen. Last image demonstrates endoscope in place with a guidewire extending medially in the right upper quadrant. The exact position of the guidewire is not certain. The pancreatic duct stent appears more inferiorly positioned. IMPRESSION: Intraoperative fluoroscopic images of ERCP as above. These images were submitted for radiologic interpretation only. Please see the procedural report for the amount of contrast and the fluoroscopy time utilized. Electronically Signed   By: Elester Grim M.D.   On: 06/28/2023 09:02   DG C-Arm 1-60 Min-No Report Result Date: 06/27/2023 Fluoroscopy was utilized by the requesting physician.  No radiographic interpretation.   DG Chest 2 View Result Date: 06/26/2023 CLINICAL DATA:  [id number] Fever [id number] EXAM: CHEST - 2 VIEW COMPARISON:  Chest x-ray 12/28/2009 FINDINGS: Enlarged cardiac silhouette. Otherwise the heart and mediastinal contours are within normal limits. No focal consolidation. No pulmonary edema. No pleural effusion. No pneumothorax. No acute osseous abnormality.  Right chest wall surgical clips. IMPRESSION: Mild cardiomegaly with no active cardiopulmonary disease. Electronically Signed   By: Morgane  Naveau M.D.   On: 06/26/2023 22:48   DG ERCP Result Date: 06/14/2023 CLINICAL DATA:  Acalculous cholecystitis; probable malignant biliary stricture EXAM: ERCP TECHNIQUE: Multiple spot images obtained with  the fluoroscopic device and submitted for interpretation post-procedure. FLUOROSCOPY: Radiation Exposure Index (as provided by the fluoroscopic device): 84.7 MGy Kerma COMPARISON:  MRCP 06/14/2023 FINDINGS: A total of 9 intraoperative spot images are submitted for review. The images demonstrate cannulation of the pancreatic duct with balloon occluded ductogram. There is diffuse pancreatic ductal dilatation. The final images demonstrate placement of a temporary plastic pancreatic duct stent. IMPRESSION: 1. Diffuse pancreatic ductal dilatation. 2. Placement of a plastic pancreatic duct stent. These images were submitted for radiologic interpretation only. Please see the procedural report for the amount of contrast and the fluoroscopy time  utilized. Electronically Signed   By: Fernando Hoyer M.D.   On: 06/14/2023 19:52   MR ABDOMEN MRCP W WO CONTAST Result Date: 06/14/2023 CLINICAL DATA:  Abdominal pain pancreatic mass suspected by prior CT EXAM: MRI ABDOMEN WITHOUT AND WITH CONTRAST (INCLUDING MRCP) TECHNIQUE: Multiplanar multisequence MR imaging of the abdomen was performed both before and after the administration of intravenous contrast. Heavily T2-weighted images of the biliary and pancreatic ducts were obtained, and three-dimensional MRCP images were rendered by post processing. CONTRAST:  7mL GADAVIST  GADOBUTROL  1 MMOL/ML IV SOLN COMPARISON:  CT abdomen pelvis, 06/13/2023 FINDINGS: Lower chest: No acute abnormality.  Cardiomegaly. Hepatobiliary: No solid liver abnormality is seen. Status post cholecystectomy. Severe intra and extrahepatic biliary ductal dilatation the common bile duct effaced within the superior pancreatic head and the extrahepatic duct measuring up to 1.7 cm in caliber. Pancreas: Vague, masslike fullness of the central pancreatic head measuring 2.5 x 2.0 cm (series 16, image 59). Minimal underlying diffusion restriction and hypoenhancement. Severe pancreatic ductal dilatation distally,  measuring up to 1.3 cm in caliber, with mild pancreatic parenchymal atrophy. Spleen: Normal in size without significant abnormality. Adrenals/Urinary Tract: Adrenal glands are unremarkable. Low-lying, under rotated right kidney, incompletely imaged. Left kidney is normal, without obvious renal calculi, solid lesion, or hydronephrosis. Stomach/Bowel: Stomach is within normal limits. No evidence of bowel wall thickening, distention, or inflammatory changes. Vascular/Lymphatic: Aortic atherosclerosis. The portal confluence and central superior mesenteric vein appear to be encased and partially effaced in the vicinity of suspected pancreatic head mass (series 2, image 44). Fat planes to the celiac axis and superior mesenteric artery are preserved. No enlarged abdominal lymph nodes. Other: No abdominal wall hernia or abnormality. No ascites. Musculoskeletal: No acute or significant osseous findings. IMPRESSION: 1. Vague, masslike fullness of the central pancreatic head measuring 2.5 x 2.0 cm. Minimal underlying diffusion restriction and hypoenhancement. Severe pancreatic ductal dilatation distally, measuring up to 1.3 cm in caliber, with mild pancreatic parenchymal atrophy. Findings are highly concerning for pancreatic adenocarcinoma. 2. Severe intra and extrahepatic biliary ductal dilatation the common bile duct effaced within the superior pancreatic head and the extrahepatic duct measuring up to 1.7 cm in caliber. 3. The portal confluence and central superior mesenteric vein appear to be encased and partially effaced in the vicinity of suspected pancreatic head mass. 4. Status post cholecystectomy. 5. No evidence of lymphadenopathy or metastatic disease in the abdomen. 6. Cardiomegaly. Aortic Atherosclerosis (ICD10-I70.0). Electronically Signed   By: Fredricka Jenny M.D.   On: 06/14/2023 10:18   MR 3D Recon At Scanner Result Date: 06/14/2023 CLINICAL DATA:  Abdominal pain pancreatic mass suspected by prior CT EXAM:  MRI ABDOMEN WITHOUT AND WITH CONTRAST (INCLUDING MRCP) TECHNIQUE: Multiplanar multisequence MR imaging of the abdomen was performed both before and after the administration of intravenous contrast. Heavily T2-weighted images of the biliary and pancreatic ducts were obtained, and three-dimensional MRCP images were rendered by post processing. CONTRAST:  7mL GADAVIST  GADOBUTROL  1 MMOL/ML IV SOLN COMPARISON:  CT abdomen pelvis, 06/13/2023 FINDINGS: Lower chest: No acute abnormality.  Cardiomegaly. Hepatobiliary: No solid liver abnormality is seen. Status post cholecystectomy. Severe intra and extrahepatic biliary ductal dilatation the common bile duct effaced within the superior pancreatic head and the extrahepatic duct measuring up to 1.7 cm in caliber. Pancreas: Vague, masslike fullness of the central pancreatic head measuring 2.5 x 2.0 cm (series 16, image 59). Minimal underlying diffusion restriction and hypoenhancement. Severe pancreatic ductal dilatation distally, measuring up to 1.3 cm in caliber, with mild  pancreatic parenchymal atrophy. Spleen: Normal in size without significant abnormality. Adrenals/Urinary Tract: Adrenal glands are unremarkable. Low-lying, under rotated right kidney, incompletely imaged. Left kidney is normal, without obvious renal calculi, solid lesion, or hydronephrosis. Stomach/Bowel: Stomach is within normal limits. No evidence of bowel wall thickening, distention, or inflammatory changes. Vascular/Lymphatic: Aortic atherosclerosis. The portal confluence and central superior mesenteric vein appear to be encased and partially effaced in the vicinity of suspected pancreatic head mass (series 2, image 44). Fat planes to the celiac axis and superior mesenteric artery are preserved. No enlarged abdominal lymph nodes. Other: No abdominal wall hernia or abnormality. No ascites. Musculoskeletal: No acute or significant osseous findings. IMPRESSION: 1. Vague, masslike fullness of the central  pancreatic head measuring 2.5 x 2.0 cm. Minimal underlying diffusion restriction and hypoenhancement. Severe pancreatic ductal dilatation distally, measuring up to 1.3 cm in caliber, with mild pancreatic parenchymal atrophy. Findings are highly concerning for pancreatic adenocarcinoma. 2. Severe intra and extrahepatic biliary ductal dilatation the common bile duct effaced within the superior pancreatic head and the extrahepatic duct measuring up to 1.7 cm in caliber. 3. The portal confluence and central superior mesenteric vein appear to be encased and partially effaced in the vicinity of suspected pancreatic head mass. 4. Status post cholecystectomy. 5. No evidence of lymphadenopathy or metastatic disease in the abdomen. 6. Cardiomegaly. Aortic Atherosclerosis (ICD10-I70.0). Electronically Signed   By: Fredricka Jenny M.D.   On: 06/14/2023 10:18   CT ABDOMEN PELVIS W CONTRAST Result Date: 06/13/2023 CLINICAL DATA:  Abdominal pain, acute, nonlocalized. Left lower side abdominal pain and brown urine since Sunday. EXAM: CT ABDOMEN AND PELVIS WITH CONTRAST TECHNIQUE: Multidetector CT imaging of the abdomen and pelvis was performed using the standard protocol following bolus administration of intravenous contrast. RADIATION DOSE REDUCTION: This exam was performed according to the departmental dose-optimization program which includes automated exposure control, adjustment of the mA and/or kV according to patient size and/or use of iterative reconstruction technique. CONTRAST:  OMNIPAQUE  IOHEXOL  300 MG/ML  SOLN COMPARISON:  11/13/2016. FINDINGS: Lower chest: Heart is mildly enlarged. Mild atelectasis or scarring is present at the lung bases. Hepatobiliary: No focal liver abnormality is seen. Status post cholecystectomy. Intrahepatic and extrahepatic biliary ductal dilatation are noted. The common bile duct measures up to 1.6 cm. Pancreas: There is an ill-defined hypodense region in the head of the pancreas  measuring 2.9 x 2.2 cm. There is dilatation of the pancreatic duct up to 1.2 cm distal to this region. No surrounding inflammatory changes are seen. Spleen: Normal in size without focal abnormality. Adrenals/Urinary Tract: The adrenal glands are within normal limits. The kidneys enhance symmetrically. 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 The right kidney is pelvic in location. No renal calculus or hydronephrosis is seen. The visualized portion of the urinary bladder is within normal limits. The bladder is not well seen due to streak hardware artifact. Stomach/Bowel: A small hiatal hernia is noted. Stomach is within normal limits. Appendix is not seen. No evidence of bowel wall thickening, distention, or inflammatory changes. No free air or pneumatosis is seen. A right inguinal hernia is noted containing nonobstructed small bowel. Vascular/Lymphatic: Aortic atherosclerosis. No enlarged abdominal or pelvic lymph nodes. Reproductive: Status post hysterectomy. No adnexal masses. Other: No abdominopelvic ascites. Musculoskeletal: Total hip arthroplasty changes are noted bilaterally. Degenerative changes are present in the thoracolumbar spine. No acute osseous abnormality is seen. IMPRESSION: 1. No acute process to explain reported left lower quadrant abdominal pain. 2. Vague hypodense region in the  head of the pancreas with biliary and pancreatic ductal dilatation, concerning for possible pancreatic adenocarcinoma. MRI with pancreatic protocol and MRCP are recommended for further evaluation. 3. Small hiatal hernia. 4. Right inguinal hernia containing nonobstructed small bowel. 5. Aortic atherosclerosis. Electronically Signed   By: Wyvonnia Heimlich M.D.   On: 06/13/2023 21:50     The results of significant diagnostics from this hospitalization (including imaging, microbiology, ancillary and laboratory) are listed below for reference.     Microbiology: No results found for this or any previous visit (from  the past 240 hours).   Labs: BNP (last 3 results) No results for input(s): "BNP" in the last 8760 hours. Basic Metabolic Panel: Recent Labs  Lab 07/03/23 1324 07/07/23 1015 07/08/23 0541 07/09/23 0604  NA 137 141 135 136  K 3.8 3.7 3.0* 3.7  CL 105 107 108 108  CO2 24 26 19* 18*  GLUCOSE 252* 137* 118* 147*  BUN 16 16 16  <5*  CREATININE 1.12* 1.33* 0.80 0.67  CALCIUM  9.9 9.6 8.3* 8.1*  MG  --   --   --  1.9   Liver Function Tests: Recent Labs  Lab 07/03/23 1324 07/07/23 1015 07/08/23 0541 07/09/23 0604  AST 492* 273* 203* 197*  ALT 585* 396* 298* 284*  ALKPHOS 1,310* 1,123* 848* 816*  BILITOT 6.5* 8.6* 8.2* 4.8*  PROT 7.5 7.1 6.4* 5.5*  ALBUMIN 4.0 3.8 2.9* 2.5*   Recent Labs  Lab 07/09/23 0604  LIPASE 58*   No results for input(s): "AMMONIA" in the last 168 hours. CBC: Recent Labs  Lab 07/03/23 1324 07/07/23 1015 07/08/23 0541 07/09/23 0604  WBC 7.8 6.5 7.8 5.8  NEUTROABS 4.7 4.5  --   --   HGB 13.2 12.2 10.8* 10.5*  HCT 39.3 35.8* 31.9* 31.4*  MCV 91.6 89.9 91.1 91.3  PLT 243 264 218 211   Cardiac Enzymes: No results for input(s): "CKTOTAL", "CKMB", "CKMBINDEX", "TROPONINI" in the last 168 hours. BNP: Invalid input(s): "POCBNP" CBG: Recent Labs  Lab 07/08/23 1534 07/08/23 1704 07/08/23 2121 07/09/23 0724 07/09/23 1112  GLUCAP 169* 156* 129* 142* 174*   D-Dimer No results for input(s): "DDIMER" in the last 72 hours. Hgb A1c No results for input(s): "HGBA1C" in the last 72 hours. Lipid Profile No results for input(s): "CHOL", "HDL", "LDLCALC", "TRIG", "CHOLHDL", "LDLDIRECT" in the last 72 hours. Thyroid  function studies No results for input(s): "TSH", "T4TOTAL", "T3FREE", "THYROIDAB" in the last 72 hours.  Invalid input(s): "FREET3" Anemia work up No results for input(s): "VITAMINB12", "FOLATE", "FERRITIN", "TIBC", "IRON", "RETICCTPCT" in the last 72 hours. Urinalysis    Component Value Date/Time   COLORURINE YELLOW 06/26/2023 2155    APPEARANCEUR CLEAR 06/26/2023 2155   LABSPEC 1.023 06/26/2023 2155   PHURINE 5.0 06/26/2023 2155   GLUCOSEU >=500 (A) 06/26/2023 2155   HGBUR NEGATIVE 06/26/2023 2155   BILIRUBINUR NEGATIVE 06/26/2023 2155   KETONESUR NEGATIVE 06/26/2023 2155   PROTEINUR 30 (A) 06/26/2023 2155   NITRITE NEGATIVE 06/26/2023 2155   LEUKOCYTESUR NEGATIVE 06/26/2023 2155   Sepsis Labs Recent Labs  Lab 07/03/23 1324 07/07/23 1015 07/08/23 0541 07/09/23 0604  WBC 7.8 6.5 7.8 5.8   Microbiology No results found for this or any previous visit (from the past 240 hours).   Time coordinating discharge:  I have spent 35 minutes face to face with the patient and on the ward discussing the patients care, assessment, plan and disposition with other care givers. >50% of the time was devoted counseling the patient about  the risks and benefits of treatment/Discharge disposition and coordinating care.   SIGNED:   Maggie Schooner, MD  [facility] 07/09/2023, 12:30 PM   If 7PM-7AM, please contact night-coverage

## 2023-07-09 NOTE — Progress Notes (Signed)
 Progress Note   Subjective  Chief Complaint: Jaundice/pancreatic mass  Today, patient feeling very well.  No abdominal pain, tolerated a regular diet.  She asked some questions about plans going forward but otherwise is doing well and okay to go home.   Objective   Vital signs in last 24 hours: Temp:  [97.3 F (36.3 C)-98.8 F (37.1 C)] 98.8 F (37.1 C) (01/15 0509) Pulse Rate:  [50-71] 53 (01/15 0509) Resp:  [13-21] 18 (01/15 0509) BP: (139-225)/(66-97) 139/66 (01/15 0512) SpO2:  [93 %-100 %] 93 % (01/15 0509) Weight:  [76.4 kg] 76.4 kg (01/14 1200)   General:    AA female in NAD Heart:  Regular rate and rhythm; no murmurs Lungs: Respirations even and unlabored, lungs CTA bilaterally Abdomen:  Soft, nontender and nondistended. Normal bowel sounds. Psych:  Cooperative. Normal mood and affect.  Intake/Output from previous day: 01/14 0701 - 01/15 0700 In: 1260.9 [I.V.:1012.4; IV Piggyback:248.5] Out: -   Lab Results: Recent Labs    07/07/23 1015 07/08/23 0541 07/09/23 0604  WBC 6.5 7.8 5.8  HGB 12.2 10.8* 10.5*  HCT 35.8* 31.9* 31.4*  PLT 264 218 211   BMET Recent Labs    07/07/23 1015 07/08/23 0541 07/09/23 0604  NA 141 135 136  K 3.7 3.0* 3.7  CL 107 108 108  CO2 26 19* 18*  GLUCOSE 137* 118* 147*  BUN 16 16 <5*  CREATININE 1.33* 0.80 0.67  CALCIUM  9.6 8.3* 8.1*      Latest Ref Rng & Units 07/09/2023    6:04 AM 07/08/2023    5:41 AM 07/07/2023   10:15 AM  Hepatic Function  Total Protein 6.5 - 8.1 g/dL 5.5  6.4  7.1   Albumin 3.5 - 5.0 g/dL 2.5  2.9  3.8   AST 15 - 41 U/L 197  203  273   ALT 0 - 44 U/L 284  298  396   Alk Phosphatase 38 - 126 U/L 816  848  1,123   Total Bilirubin 0.0 - 1.2 mg/dL 4.8  8.2  8.6      PT/INR Recent Labs    07/07/23 1015  LABPROT 12.6  INR 0.9    Studies/Results: DG ERCP Result Date: 07/09/2023 CLINICAL DATA:  Malignant biliary stricture EXAM: ERCP TECHNIQUE: Multiple spot images obtained with the  fluoroscopic device and submitted for interpretation post-procedure. FLUOROSCOPY: Radiation Exposure Index (as provided by the fluoroscopic device): 67.31 mGy Kerma COMPARISON:  Prior ERCP 06/27/2023; MRCP 06/14/2023 FINDINGS: A total of 16 intraoperative spot images are submitted for review. The images demonstrate a flexible duodenal scope in the descending duodenum with wire cannulation of the biliary system. Cholangiography demonstrates intra and extrahepatic biliary ductal dilatation with what appears to be complete occlusion of the mid and distal common bile duct. On the final images, a plastic biliary stent has been placed. IMPRESSION: ERCP with placement of plastic biliary stent as above. These images were submitted for radiologic interpretation only. Please see the procedural report for the amount of contrast and the fluoroscopy time utilized. Electronically Signed   By: Fernando Hoyer M.D.   On: 07/09/2023 07:49   EUS/ERCP 07/08/2023 EGD Impression:                           - No gross lesions in the entire esophagus. Z-line  irregular, 40 cm from the incisors.                           - 1 cm hiatal hernia.                           - Erosive gastropathy with no bleeding and no                            stigmata of recent bleeding. Biopsied.                           - No gross lesions in the duodenal bulb, in the                            first portion of the duodenum and in the second                            portion of the duodenum.                           - Normal major papilla.                           EUS Impression:                           - A mass was identified in the pancreatic head.                            Cytology results are pending. However, the                            endosonographic appearance is suspicious for                            adenocarcinoma. This was staged T2 N0 Mx by                            endosonographic  criteria. The staging applies if                            malignancy is confirmed (with the caveat that I                            cannot visualize the superior mesenteric artery                            completely). Fine needle biopsy performed.                           - There was dilation in the common bile duct, in                            the common hepatic duct  and in the intrahepatic                            bile ducts, diffusely.                           - No malignant-appearing lymph nodes were                            visualized in the celiac region (level 20),                            peripancreatic region and porta hepatis region. ERCP impression: Impression:               - What appears to be a small biliary sphincterotomy                            appeared open.                           - Using a 2 wire technique, we were able to                            cannulate deeply the bile duct proximally.                           - A single severe biliary stricture was found in                            the middle third of the main bile duct. The                            stricture was malignant appearing. This was brushed                            for cytology.                           - The upper third of the main bile duct and middle                            third of the main bile duct were dilated.                           - The previous biliary sphincterotomy was extended.                           - One plastic biliary stent was placed into the                            common bile duct to traverse the stenosis.   Assessment / Plan:   Assessment: 1.  Obstructive jaundice with mass in the head of the pancreas and pancreatic/biliary dilation: Status post 2 prior ERCP attempts for stent placement, repeat ERCP with  EUS 07/08/2023 with Dr. Brice Campi who masterfully was able to place a stent and LFTs are trending down today, no abdominal pain, tolerating  regular diet 2.  H. pylori gastritis: Will need to be followed outpatient  Plan: 1.  Still awaiting cytology results 2.  Continue to monitor LFTs while in the hospital 3.  Continue regular diet 4.  Patient can likely be discharged home soon from our perspective 5.  Plans for repeat ERCP in 6 months, this will be arranged by our office 6.  Would recommend continuing Ciprofloxacin  500 mg twice daily x 3 days-okay to transition to oral meds if ready for discharge 7.  Will follow-up with H. pylori recommendations  Thank you for your kind consultation, we will sign off.   LOS: 2 days   Graciella Lavender  07/09/2023, 11:19 AM

## 2023-07-10 ENCOUNTER — Encounter: Payer: Self-pay | Admitting: Gastroenterology

## 2023-07-10 ENCOUNTER — Encounter (HOSPITAL_COMMUNITY): Payer: PPO

## 2023-07-10 ENCOUNTER — Telehealth: Payer: Self-pay

## 2023-07-10 LAB — SURGICAL PATHOLOGY

## 2023-07-10 NOTE — Transitions of Care (Post Inpatient/ED Visit) (Signed)
07/10/2023  Name: Adrienne [name] MRN: [id number] DOB: 10-29-1945  Today's TOC FU Call Status: Today's TOC FU Call Status:: Successful TOC FU Call Completed TOC FU Call Complete Date: 07/10/23 Patient's Name and Date of Birth confirmed.  Transition Care Management Follow-up Telephone Call Date of Discharge: 07/09/23 Discharge Facility: [facility]) Type of Discharge: Inpatient Admission Primary Inpatient Discharge Diagnosis:: Pancreatic mass How have you been since you were released from the hospital?: Better Any questions or concerns?: No  Items Reviewed: Did you receive and understand the discharge instructions provided?: Yes Medications obtained,verified, and reconciled?: Yes (Medications Reviewed) Any new allergies since your discharge?: No Dietary orders reviewed?: Yes Type of Diet Ordered:: carb modified Do you have support at home?: Yes People in Home: child(ren), adult  Medications Reviewed Today: Medications Reviewed Today     Reviewed by Earlie Server, RN (Registered Nurse) on 07/10/23 at 1049  Med List Status: <None>   Medication Order Taking? Sig Documenting Provider Last Dose Status Informant  celecoxib (CELEBREX) 100 MG capsule [id number] No Take 100 mg by mouth 2 (two) times daily.  Patient not taking: Reported on 07/07/2023   [provider] Not Taking Active Self, Pharmacy Records  cetirizine (ZYRTEC) 10 MG tablet [id number] No Take 1 tablet (10 mg total) by mouth daily.  Patient not taking: Reported on 07/07/2023   Ashok Croon, MD Not Taking Active Self, Pharmacy Records           Med Note [facility] [place], [place] A   Thu Jun 26, 2023  7:52 PM)    Cholecalciferol (VITAMIN D-3) 125 MCG (5000 UT) TABS [id number] Yes Take 1 tablet by mouth daily. [provider] Taking Active Self, Pharmacy Records           Med Note (CRUTHIS, Carlota Raspberry Jul 07, 2023 12:40 PM) When able to remember   ciprofloxacin (CIPRO) 500 MG tablet  [id number] Yes Take 1 tablet (500 mg total) by mouth 2 (two) times daily for 2 days. Miguel Rota, MD Taking Active   famotidine (PEPCID) 20 MG tablet [id number] Yes Take 1 tablet (20 mg total) by mouth 2 (two) times daily.  Patient taking differently: Take 20 mg by mouth daily.   Ashok Croon, MD Taking Active Self, Pharmacy Records  fluticasone [facility]) 50 MCG/ACT nasal spray [id number] Yes Place 2 sprays into both nostrils daily. Ashok Croon, MD Taking Active Self, Pharmacy Records  insulin aspart (NOVOLOG) 100 UNIT/ML FlexPen [id number] Yes Inject 0-6 Units into the skin 3 (three) times daily with meals. Check Blood Glucose (BG) and inject per scale: BG <150= 0 unit; BG 150-200= 1 unit; BG 201-250= 2 unit; BG 251-300= 3 unit; BG 301-350= 4 unit; BG 351-400= 5 unit; BG >400= 6 unit and Call Primary Care. Cathren Harsh, MD Taking Active Self, Pharmacy Records  Insulin Glargine [facility] 100 UNIT/ML [id number] Yes Inject 12 Units into the skin daily. May substitute as needed per insurance.  Patient taking differently: Inject 12 Units into the skin at bedtime.   Cathren Harsh, MD Taking Active Self, Pharmacy Records  losartan (COZAAR) 100 MG tablet [id number] Yes Take 1 tablet by mouth once daily Anabel Halon, MD Taking Active Self, Pharmacy Records  metFORMIN (GLUCOPHAGE-XR) 500 MG 24 hr tablet [id number] Yes Take 500 mg by mouth daily with breakfast. [provider] Taking Active Self, Pharmacy Records  thyroid (NP THYROID) 90 MG tablet [id number] Yes Take 1 tablet (90 mg total) by mouth  daily. Anabel Halon, MD Taking Active Self, Pharmacy Records            Home Care and Equipment/Supplies: Were Home Health Services Ordered?: No Any new equipment or medical supplies ordered?: No  Functional Questionnaire: Do you need assistance with bathing/showering or dressing?: No Do you need assistance with meal preparation?: No Do you need assistance with eating?:  No Do you have difficulty maintaining continence: No Do you need assistance with getting out of bed/getting out of a chair/moving?: No Do you have difficulty managing or taking your medications?: No  Follow up appointments reviewed: PCP Follow-up appointment confirmed?: No (patient reports that MD office will call her today, if not she will call them tomorrow) MD Provider Line [phone] Given: No Specialist Hospital Follow-up appointment confirmed?: Yes Do you need transportation to your follow-up appointment?: No Do you understand care options if your condition(s) worsen?: Yes-patient verbalized understanding  SDOH Interventions Today    Flowsheet Row Most Recent Value  SDOH Interventions   Food Insecurity Interventions Intervention Not Indicated  Housing Interventions Intervention Not Indicated  Transportation Interventions Intervention Not Indicated  Utilities Interventions Intervention Not Indicated      Patient reports that she is doing well. Reports that she has all her medications and is taking them as prescribed, Reports no pain today.  States that her "tastebuds" are not right.  Encouraged patient to eat and drink well.  Encouraged patient to get a follow up appointment with PCP and she verbalized understanding.  Offered 30 day TOC program and patient declined.  Lonia Chimera, RN, BSN, CEN [employer]- Transition of Care Team.  [facility] [phone]

## 2023-07-11 ENCOUNTER — Encounter (HOSPITAL_COMMUNITY): Payer: Self-pay | Admitting: Gastroenterology

## 2023-07-11 LAB — CYTOLOGY - NON PAP

## 2023-07-11 NOTE — Telephone Encounter (Signed)
Spoke with patient. She is scheduled with PCP on Feb 20.

## 2023-07-11 NOTE — Telephone Encounter (Signed)
Copied from CRM [id number]. Topic: Clinical - Medication Question >> Jul 11, 2023 11:26 AM Geroge Baseman wrote: Reason for CRM: Patient was just in the hospital and they changed a bunch of her meds, she is now only on thyroid and vitamin d and insulin. I tried to schedule her a hospital follow up as she was discharged from  on 07/09/2023. She states she just wanted to speak with someone in the office first because she doesn't know how to proceed with her medications being change.

## 2023-07-14 ENCOUNTER — Telehealth: Payer: Self-pay | Admitting: Nurse Practitioner

## 2023-07-14 NOTE — Telephone Encounter (Signed)
Patient stated they did not want to come in for scheduled appointment without completing scan first, message has been forwarded over to the provider and also the desk nurse regarding scheduling

## 2023-07-15 ENCOUNTER — Other Ambulatory Visit: Payer: Self-pay | Admitting: Nurse Practitioner

## 2023-07-15 ENCOUNTER — Other Ambulatory Visit: Payer: PPO

## 2023-07-15 ENCOUNTER — Ambulatory Visit: Payer: PPO | Admitting: Nurse Practitioner

## 2023-07-15 DIAGNOSIS — C250 Malignant neoplasm of head of pancreas: Secondary | ICD-10-CM

## 2023-07-15 NOTE — Progress Notes (Signed)
I called patient and, with her permission, daughter Kelly Splinter to review path results which confirm pancreatic adenocarcinoma. She is doing well at home, eating/drinking with good PS. Denies pain or signs of juandice.   I reviewed her work up thus far and Dr. Latanya Maudlin recommendation to proceed with port placement. Pt agrees. Will proceed with orders and scheduling. She is aware of PET scan 1/23 to complete staging and lab/fup on 1/29. No other needs at this time.  Santiago Glad, NP

## 2023-07-16 ENCOUNTER — Telehealth: Payer: Self-pay

## 2023-07-16 NOTE — Telephone Encounter (Signed)
Copied from CRM [id number]. Topic: Clinical - Medical Advice >> Jul 16, 2023  3:11 PM Shelah Lewandowsky wrote: Reason for CRM: Patients dentist wants medical clearance for the patient to have her dental check up, please call patient [phone]

## 2023-07-17 ENCOUNTER — Ambulatory Visit (HOSPITAL_COMMUNITY)
Admission: RE | Admit: 2023-07-17 | Discharge: 2023-07-17 | Disposition: A | Discharge status: Home / Self Care | Payer: PPO | Source: Ambulatory Visit | Attending: Nurse Practitioner | Admitting: Nurse Practitioner

## 2023-07-17 DIAGNOSIS — K449 Diaphragmatic hernia without obstruction or gangrene: Secondary | ICD-10-CM | POA: Insufficient documentation

## 2023-07-17 DIAGNOSIS — Z853 Personal history of malignant neoplasm of breast: Secondary | ICD-10-CM | POA: Diagnosis not present

## 2023-07-17 DIAGNOSIS — R1900 Intra-abdominal and pelvic swelling, mass and lump, unspecified site: Secondary | ICD-10-CM

## 2023-07-17 DIAGNOSIS — I700 Atherosclerosis of aorta: Secondary | ICD-10-CM | POA: Diagnosis not present

## 2023-07-17 DIAGNOSIS — J439 Emphysema, unspecified: Secondary | ICD-10-CM | POA: Diagnosis not present

## 2023-07-17 DIAGNOSIS — C250 Malignant neoplasm of head of pancreas: Secondary | ICD-10-CM | POA: Diagnosis not present

## 2023-07-17 DIAGNOSIS — Z96643 Presence of artificial hip joint, bilateral: Secondary | ICD-10-CM | POA: Insufficient documentation

## 2023-07-17 DIAGNOSIS — K8689 Other specified diseases of pancreas: Secondary | ICD-10-CM

## 2023-07-17 DIAGNOSIS — I2510 Atherosclerotic heart disease of native coronary artery without angina pectoris: Secondary | ICD-10-CM | POA: Diagnosis not present

## 2023-07-17 LAB — GLUCOSE, CAPILLARY: Glucose-Capillary: 74 mg/dL (ref 70–99)

## 2023-07-17 MED ORDER — FLUDEOXYGLUCOSE F - 18 (FDG) INJECTION
8.3800 | Freq: Once | INTRAVENOUS | Status: AC
  Administered 2023-07-17: 8.38 via INTRAVENOUS

## 2023-07-17 NOTE — Telephone Encounter (Signed)
Spoke to pt call was disconnected in the middle of talking to her. Tried calling back unable to reach her.

## 2023-07-19 ENCOUNTER — Other Ambulatory Visit: Payer: Self-pay | Admitting: Internal Medicine

## 2023-07-19 DIAGNOSIS — E1169 Type 2 diabetes mellitus with other specified complication: Secondary | ICD-10-CM

## 2023-07-21 ENCOUNTER — Other Ambulatory Visit: Payer: Self-pay | Admitting: Internal Medicine

## 2023-07-21 DIAGNOSIS — E1169 Type 2 diabetes mellitus with other specified complication: Secondary | ICD-10-CM

## 2023-07-22 ENCOUNTER — Other Ambulatory Visit: Payer: Self-pay | Admitting: Internal Medicine

## 2023-07-22 DIAGNOSIS — E1169 Type 2 diabetes mellitus with other specified complication: Secondary | ICD-10-CM

## 2023-07-22 MED ORDER — LANCETS MISC. MISC: 1.0000 | Freq: Three times a day (TID) | 5 refills | Status: AC

## 2023-07-22 MED ORDER — BLOOD GLUCOSE TEST VI STRP: 1.0000 | ORAL_STRIP | Freq: Three times a day (TID) | 5 refills | Status: AC

## 2023-07-22 NOTE — Assessment & Plan Note (Signed)
cT2N0M0, stage IB -Patient presented with obstructive jaundice, CT and MRI showed a 2.5 cm mass in the head of the pancreas, with pancreatic duct and biliary duct dilatation.  ERCP was attempted twice but failed due to deformed duodenum -She underwent ERCP and stent placement, EUS with fine-needle biopsy of the pancreatic mass by Dr. Meridee Score on July 08, 2023.  Biopsy confirmed adenocarcinoma. -Due to the tumor invasion to portal vein and SMV, I recommended neoadjuvant chemotherapy.  Plan to review her case in GI tumor conference.

## 2023-07-22 NOTE — Telephone Encounter (Unsigned)
Copied from CRM [id number]. Topic: Clinical - Prescription Issue >> Jul 22, 2023 10:18 AM Fonda Kinder J wrote: Reason for CRM: Pt called in stating her pharmacy was waiting on provider's authorization for her test strips, she states she is completely out of the medication. Pt is aware it may take 2-3 days but just wanted to advise that she is completely out and really needs to take her blood sugar

## 2023-07-23 ENCOUNTER — Inpatient Hospital Stay: Payer: PPO | Admitting: Hematology

## 2023-07-23 ENCOUNTER — Encounter: Payer: Self-pay | Admitting: Hematology

## 2023-07-23 ENCOUNTER — Inpatient Hospital Stay: Payer: PPO

## 2023-07-23 VITALS — BP 177/83 | HR 58 | Temp 97.3°F | Resp 18 | Wt 164.9 lb

## 2023-07-23 DIAGNOSIS — Z9011 Acquired absence of right breast and nipple: Secondary | ICD-10-CM | POA: Diagnosis not present

## 2023-07-23 DIAGNOSIS — C259 Malignant neoplasm of pancreas, unspecified: Secondary | ICD-10-CM | POA: Diagnosis not present

## 2023-07-23 DIAGNOSIS — Z86000 Personal history of in-situ neoplasm of breast: Secondary | ICD-10-CM | POA: Diagnosis not present

## 2023-07-23 DIAGNOSIS — E119 Type 2 diabetes mellitus without complications: Secondary | ICD-10-CM | POA: Diagnosis not present

## 2023-07-23 DIAGNOSIS — K8689 Other specified diseases of pancreas: Secondary | ICD-10-CM

## 2023-07-23 DIAGNOSIS — Z87891 Personal history of nicotine dependence: Secondary | ICD-10-CM | POA: Diagnosis not present

## 2023-07-23 DIAGNOSIS — I10 Essential (primary) hypertension: Secondary | ICD-10-CM | POA: Diagnosis not present

## 2023-07-23 LAB — CBC WITH DIFFERENTIAL/PLATELET
Abs Immature Granulocytes: 0.02 10*3/uL (ref 0.00–0.07)
Basophils Absolute: 0 10*3/uL (ref 0.0–0.1)
Basophils Relative: 1 %
Eosinophils Absolute: 0.1 10*3/uL (ref 0.0–0.5)
Eosinophils Relative: 1 %
HCT: 36.4 % (ref 36.0–46.0)
Hemoglobin: 12 g/dL (ref 12.0–15.0)
Immature Granulocytes: 0 %
Lymphocytes Relative: 27 %
Lymphs Abs: 2.1 10*3/uL (ref 0.7–4.0)
MCH: 30.7 pg (ref 26.0–34.0)
MCHC: 33 g/dL (ref 30.0–36.0)
MCV: 93.1 fL (ref 80.0–100.0)
Monocytes Absolute: 0.5 10*3/uL (ref 0.1–1.0)
Monocytes Relative: 6 %
Neutro Abs: 5.2 10*3/uL (ref 1.7–7.7)
Neutrophils Relative %: 65 %
Platelets: 261 10*3/uL (ref 150–400)
RBC: 3.91 MIL/uL (ref 3.87–5.11)
RDW: 13.8 % (ref 11.5–15.5)
WBC: 8 10*3/uL (ref 4.0–10.5)
nRBC: 0 % (ref 0.0–0.2)

## 2023-07-23 LAB — COMPREHENSIVE METABOLIC PANEL
ALT: 165 U/L — ABNORMAL HIGH (ref 0–44)
AST: 78 U/L — ABNORMAL HIGH (ref 15–41)
Albumin: 3.6 g/dL (ref 3.5–5.0)
Alkaline Phosphatase: 607 U/L — ABNORMAL HIGH (ref 38–126)
Anion gap: 7 (ref 5–15)
BUN: 13 mg/dL (ref 8–23)
CO2: 29 mmol/L (ref 22–32)
Calcium: 9.5 mg/dL (ref 8.9–10.3)
Chloride: 109 mmol/L (ref 98–111)
Creatinine, Ser: 1.15 mg/dL — ABNORMAL HIGH (ref 0.44–1.00)
GFR, Estimated: 50 mL/min — ABNORMAL LOW (ref >60)
Glucose, Bld: 132 mg/dL — ABNORMAL HIGH (ref 70–99)
Potassium: 3.7 mmol/L (ref 3.5–5.1)
Sodium: 145 mmol/L (ref 135–145)
Total Bilirubin: 1.7 mg/dL — ABNORMAL HIGH (ref 0.0–1.2)
Total Protein: 7 g/dL (ref 6.5–8.1)

## 2023-07-23 MED ORDER — LIDOCAINE-PRILOCAINE 2.5-2.5 % EX CREA: 1.0000 | TOPICAL_CREAM | CUTANEOUS | 2 refills | Status: DC | PRN

## 2023-07-23 MED ORDER — ONDANSETRON HCL 8 MG PO TABS: 8.0000 mg | ORAL_TABLET | Freq: Three times a day (TID) | ORAL | 1 refills | Status: DC | PRN

## 2023-07-23 MED ORDER — PROCHLORPERAZINE MALEATE 10 MG PO TABS: 10.0000 mg | ORAL_TABLET | Freq: Four times a day (QID) | ORAL | 2 refills | Status: DC | PRN

## 2023-07-23 NOTE — Progress Notes (Signed)
[facility]   [phone] [phone]   Clinic Follow up Note   Patient Care Team: Anabel Halon, MD as PCP - General (Internal Medicine) Rollene Rotunda, MD as PCP - Cardiology (Cardiology) Donnelly Angelica, RN as Oncology Nurse Navigator Pershing Proud, RN as Oncology Nurse Navigator Griselda Miner, MD as Consulting Physician (General Surgery) Malachy Mood, MD as Consulting Physician (Hematology) Lonie Peak, MD as Attending Physician (Radiation Oncology) Erroll Luna, [facility] (Inactive) as Pharmacist (Pharmacist)  Date of Service:  07/23/2023  CHIEF COMPLAINT: f/u of newly diagnosed pancreatic cancer  CURRENT THERAPY:  Pending neoadjuvant chemotherapy  Oncology History   Pancreatic adenocarcinoma (HCC) cT2N0M0, stage IB -Patient presented with obstructive jaundice, CT and MRI showed a 2.5 cm mass in the head of the pancreas, with pancreatic duct and biliary duct dilatation.  ERCP was attempted twice but failed due to deformed duodenum -She underwent ERCP and stent placement, EUS with fine-needle biopsy of the pancreatic mass by Dr. Meridee Score on July 08, 2023.  Biopsy confirmed adenocarcinoma. -Due to the tumor invasion to portal vein and SMV, I recommended neoadjuvant chemotherapy.  Plan to review her case in GI tumor conference.   Assessment and Plan    Pancreatic Adenocarcinoma Pancreatic adenocarcinoma in the head of the pancreas causing jaundice and bile duct obstruction. Tumor invading local veins. Successful stent placement by Dr. Meridee Score. PET scan shows no metastasis to lungs or liver, but tumor is compressing the duodenum. Bilirubin improved to 1.7, liver enzymes decreasing.  -Due to the vascular involvement, I recommend neoadjuvant chemotherapy.  She is open to chemotherapy. Discussed chemotherapy options: first regimen FOLFIRINOX (oxaliplatin, irinotecan, 5-FU) is more effective in general but involves a six-hour infusion  and a two-day pump, with potential side effects including nausea, diarrhea, alopecia, and neuropathy. Second regimen gemcitabine and Abraxane is less intensive but probably less effective.  After lengthy discussion, she agrees to try FOLFIRINOX. -Surgery (Whipple) discussed as the only curative option, with a 60-70% recurrence rate. Chemotherapy before surgery to shrink tumor and improve surgical outcomes. One in three chance of cure with combined treatment. - Schedule chemotherapy with oxaliplatin, irinotecan, 5-FU starting on Tuesday, August 05, 2023. - Arrange port placement on Tuesday, July 29, 2023. - Order Zofran and Compazine for nausea management post-chemotherapy. - Schedule chemotherapy class for patient education. - Refer to pancreatic surgeon for potential Whipple surgery post-chemotherapy. - Order genetic testing for pancreatic cancer.  Diabetes Mellitus Diabetes mellitus managed with insulin. Reports difficulty with diet management. - Refer to dietician for dietary management advice.  Hypertension Hypertension managed with Losartan.  Hypothyroidism Hypothyroidism managed with thyroid medication.  General Health Maintenance Generally active and lives with her grandson. Resolved anemia. - Encourage hydration to improve kidney function. - Monitor blood counts regularly.  Plan -PET scan reviewed, the formal report still pending. -I recommend neoadjuvant chemotherapy, she agrees to try FOLFIRINOX, will schedule after her birthday - Schedule chemotherapy to start on Tuesday, August 05, 2023. - Schedule port placement on Tuesday, July 29, 2023. - Arrange follow-up appointments with pancreatic surgeon and dietician.      SUMMARY OF ONCOLOGIC HISTORY: Oncology History  Ductal carcinoma in situ (DCIS) of right breast  01/28/2019 Mammogram   Diagnostic Mammogram 01/28/19  IMPRESSION: Right breast retroareolar 7 mm grouped pleomorphic calcifications are suspicious.    02/02/2019 Initial Biopsy   Diagnosis 02/02/19 Breast, right, needle core biopsy, outer retroareolar - DUCTAL CARCINOMA IN SITU WITH CALCIFICATIONS. Microscopic Comment The ductal carcinoma  in situ is intermediate grade. Estrogen and progesterone receptors will be performed.  Results: IMMUNOHISTOCHEMICAL AND MORPHOMETRIC ANALYSIS PERFORMED MANUALLY Estrogen Receptor: 90%, POSITIVE, STRONG STAINING INTENSITY Progesterone Receptor: 20%, POSITIVE, STRONG STAINING INTENSITY    02/02/2019 Cancer Staging   Staging form: Breast, AJCC 8th Edition - Clinical stage from 02/02/2019: Stage 0 (cTis (DCIS), cN0, cM0, G2, ER+, PR+, HER2: Not Assessed) - Signed by Malachy Mood, MD on 02/10/2019   02/04/2019 Initial Diagnosis   Ductal carcinoma in situ (DCIS) of right breast   03/18/2019 Surgery   RIGHT BREAST LUMPECTOMY WITH RADIOACTIVE SEED LOCALIZATION by Dr. Carolynne Edouard 03/18/19   03/18/2019 Pathology Results   FINAL MICROSCOPIC DIAGNOSIS: 03/18/19 A. BREAST, RIGHT, LUMPECTOMY:  - Ductal carcinoma in situ with calcifications and necrosis, 1.4 cm.  - Ductal carcinoma in situ focally 0.1 cm from medial and anterior  lumpectomy margins.  - Ductal carcinoma in situ involves the final anterior margin (See part  D).  - Ductal carcinoma in situ focally 0.3 cm from superior lumpectomy  margin.  - Biopsy site and biopsy clip.   B. BREAST, RIGHT LATERAL MARGIN, EXCISION:  - Benign breast tissue.  - No ductal carcinoma in situ.  - Final lateral margin greater than 1 cm.   C. BREAST, RIGHT MEDIAL MARGIN, EXCISION:  - Ductal carcinoma in situ, 0.3 cm.  - Ductal carcinoma in situ with focally 0.3 cm from final medial margin.   D. BREAST, RIGHT ANTERIOR MARGIN, EXCISION:  - Ductal carcinoma in situ, 1.5 cm.  - Ductal carcinoma in situ focally involves final anterior margin.   E. BREAST, RIGHT DEEP MARGIN, EXCISION:  - Fibrocystic changes.  - No ductal carcinoma in situ.  - Final posterior margin greater than  0.7 cm.    04/08/2019 Surgery   RE-EXCISION OF RIGHT BREAST ANTERIOR MARGINS by Dr Carolynne Edouard 04/08/19   04/08/2019 Pathology Results    DIAGNOSIS: 04/08/19  A. BREAST, RIGHT, ANTERIOR SUPERIOR, EXCISION:  - Intermediate grade ductal carcinoma in situ with necrosis.  - In situ carcinoma is <1 mm (not on ink) from the new anterior superior  margin, multifocally.  - Intraductal papilloma.  - Fibrocystic change.   B. BREAST, RIGHT, ANTERIOR INFERIOR, EXCISION:  - Benign breast tissue with resection changes.   C. BREAST, RIGHT, MEDIAL, EXCISION:  - Benign breast tissue with resection changes.  - Fibroadenomatoid and fibrocystic changes.   05/06/2019 Mammogram   Right Mammogram 05/06/19 IMPRESSION: Suspicious finding with 2 groups of microcalcifications in the medial right breast. The anterior group measures 2 x 2 x 2 mm. The more posterior group measures 4 x 3 x 4 mm.   05/12/2019 Pathology Results   Diagnosis 05/12/19 1. Breast, right, needle core biopsy, medial - DUCTAL CARCINOMA IN SITU WITH CALCIFICATIONS. - SEE MICROSCOPIC DESCRIPTION. 2. Breast, right, needle core biopsy, medial - DUCTAL CARCINOMA IN SITU WITH CALCIFICATIONS. - SEE MICROSCOPIC DESCRIPTION. Results: IMMUNOHISTOCHEMICAL AND MORPHOMETRIC ANALYSIS PERFORMED MANUALLY Estrogen Receptor: 100%, POSITIVE, STRONG STAINING INTENSITY Progesterone Receptor: 0%, NEGATIVE COMMENT: The negative hormone receptor study(ies) in this case has an internal positive control. Results: IMMUNOHISTOCHEMICAL AND MORPHOMETRIC ANALYSIS PERFORMED MANUALLY Estrogen Receptor: 100%, POSITIVE, STRONG STAINING INTENSITY Progesterone Receptor: 0%, NEGATIVE COMMENT: The negative hormone receptor study(ies) in this case has an internal positive control.   07/26/2019 Surgery   RIGHT MASTECTOMY WITH SENTINEL NODE BIOPSY by Dr. Carolynne Edouard 07/26/19   07/26/2019 Pathology Results   FINAL MICROSCOPIC DIAGNOSIS: 07/26/19  A. LYMPH NODE, RIGHT #1, SENTINEL,   BIOPSY:  -  No  carcinoma identified in one lymph node (0/1)   B. BREAST, RIGHT, MASTECTOMY:  -  Ductal carcinoma in situ, intermediate grade, 0.6 cm  -  Margins uninvolved by carcinoma (greater than 1 cm; posterior margin)  -  Previous biopsy site changes (x2)  -  See oncology table below   07/26/2019 Cancer Staging   Staging form: Breast, AJCC 8th Edition - Pathologic stage from 07/26/2019: Stage 0 (pTis (DCIS), pN0, cM0, G2, ER+, PR+, HER2: Not Assessed) - Signed by Malachy Mood, MD on 08/10/2019   Pancreatic adenocarcinoma (HCC)  07/07/2022 Cancer Staging   Staging form: Exocrine Pancreas, AJCC 8th Edition - Clinical stage from 07/07/2022: Stage IB (cT2, cN0, cM0) - Signed by Malachy Mood, MD on 07/22/2023 Stage prefix: Initial diagnosis Total positive nodes: 0   06/13/2023 Initial Diagnosis   Pancreatic adenocarcinoma (HCC)   07/30/2023 -  Chemotherapy   Patient is on Treatment Plan : PANCREAS Modified FOLFIRINOX q14d x 4 cycles        Discussed the use of AI scribe software for clinical note transcription with the patient, who gave verbal consent to proceed.  History of Present Illness   The patient is a 78 year old woman with a history of diabetes and recently diagnosed pancreatic adenocarcinoma. She reports feeling better since a recent procedure to place a stent in the bile duct to alleviate jaundice. The patient's jaundice has improved, and her urine has become lighter. She denies any pain or nausea. However, she reports difficulty in managing her diet due to her diabetes and is on insulin. She expresses interest in receiving dietary advice from a dietician.         All other systems were reviewed with the patient and are negative.  MEDICAL HISTORY:  Past Medical History:  Diagnosis Date   Allergy    Ambulates with cane    straight cane   Arthritis    knee, back   CHF (congestive heart failure) (HCC)    Ductal carcinoma in situ of breast 12/2018   R Breast-mastectomy only    Gout    History of blood transfusion 1986   w/ Hysterectomy surgery   Hyperlipidemia    diet controlled - no meds   Hypertension    Hypothyroidism    Pelvic kidney    lower right pelvic kidney    SBO (small bowel obstruction) (HCC) 10/2016   surgery    Sleep apnea    Mild - no mask needed per sleep study   Smoker    quit smoking 2018   Type 2 diabetes mellitus (HCC)     SURGICAL HISTORY: Past Surgical History:  Procedure Laterality Date   ABDOMINAL HYSTERECTOMY  1986   COMPLETE-precancerous   APPENDECTOMY     pt states it was removed when gallbladder was removed.   AUGMENTATION MAMMAPLASTY     BACK SURGERY     lower back   BILIARY BRUSHING  07/08/2023   Procedure: BILIARY BRUSHING;  Surgeon: Meridee Score Netty Starring., MD;  Location: [facility];  Service: Gastroenterology;;   BILIARY STENT PLACEMENT N/A 07/08/2023   Procedure: BILIARY STENT PLACEMENT;  Surgeon: Lemar Lofty., MD;  Location: [facility];  Service: Gastroenterology;  Laterality: N/A;   BIOPSY  06/14/2023   Procedure: BIOPSY;  Surgeon: Iva Boop, MD;  Location: [facility];  Service: Gastroenterology;;   BIOPSY  07/08/2023   Procedure: BIOPSY;  Surgeon: Lemar Lofty., MD;  Location: [facility];  Service: Gastroenterology;;   BREAST BIOPSY Right 05/12/2019  times 2   BREAST LUMPECTOMY WITH RADIOACTIVE SEED LOCALIZATION Right 03/18/2019   Procedure: RIGHT BREAST LUMPECTOMY WITH RADIOACTIVE SEED LOCALIZATION;  Surgeon: Griselda Miner, MD;  Location[facility];  Service: General;  Laterality: Right;   CHOLECYSTECTOMY     COLONOSCOPY  02/15/2008   [facility]    COLONOSCOPY WITH PROPOFOL  02/27/2018   Dr.Nandigam   ECTOPIC PREGNANCY SURGERY     ERCP N/A 06/14/2023   Procedure: ENDOSCOPIC RETROGRADE CHOLANGIOPANCREATOGRAPHY (ERCP);  Surgeon: Iva Boop, MD;  Location: [facility];  Service: Gastroenterology;  Laterality: N/A;   ERCP N/A 06/27/2023   Procedure: ENDOSCOPIC RETROGRADE  CHOLANGIOPANCREATOGRAPHY (ERCP);  Surgeon: Lynann Bologna, MD;  Location: [facility];  Service: Gastroenterology;  Laterality: N/A;   ERCP N/A 07/08/2023   Procedure: ENDOSCOPIC RETROGRADE CHOLANGIOPANCREATOGRAPHY (ERCP);  Surgeon: Lemar Lofty., MD;  Location: [facility];  Service: Gastroenterology;  Laterality: N/A;   ESOPHAGOGASTRODUODENOSCOPY N/A 07/08/2023   Procedure: ESOPHAGOGASTRODUODENOSCOPY (EGD);  Surgeon: Lemar Lofty., MD;  Location: [facility];  Service: Gastroenterology;  Laterality: N/A;   ESOPHAGOGASTRODUODENOSCOPY (EGD) WITH PROPOFOL N/A 06/14/2023   Procedure: ESOPHAGOGASTRODUODENOSCOPY (EGD) WITH PROPOFOL;  Surgeon: Iva Boop, MD;  Location: [facility];  Service: Gastroenterology;  Laterality: N/A;   EUS N/A 07/08/2023   Procedure: UPPER ENDOSCOPIC ULTRASOUND (EUS) LINEAR;  Surgeon: Lemar Lofty., MD;  Location: [facility];  Service: Gastroenterology;  Laterality: N/A;   FINE NEEDLE ASPIRATION N/A 07/08/2023   Procedure: FINE NEEDLE ASPIRATION (FNA) LINEAR;  Surgeon: Lemar Lofty., MD;  Location: [facility];  Service: Gastroenterology;  Laterality: N/A;   JOINT REPLACEMENT     Left hip total Dr. Lequita Halt 10-01-17   MALONEY DILATION  06/14/2023   Procedure: Elease Hashimoto DILATION;  Surgeon: Iva Boop, MD;  Location: [facility];  Service: Gastroenterology;;   MASTECTOMY Right 07/26/2019   PANCREATIC STENT PLACEMENT  06/14/2023   Procedure: PANCREATIC STENT PLACEMENT;  Surgeon: Iva Boop, MD;  Location: [facility];  Service: Gastroenterology;;   PANCREATIC STENT PLACEMENT  06/27/2023   Procedure: PANCREATIC STENT PLACEMENT;  Surgeon: Lynann Bologna, MD;  Location: [facility];  Service: Gastroenterology;;   POLYPECTOMY     polypectomy-oropharynx     RE-EXCISION OF BREAST CANCER,SUPERIOR MARGINS Right 04/08/2019   Procedure: RE-EXCISION OF RIGHT BREAST ANTERIOR MARGINS;  Surgeon: Chevis Pretty III, MD;  Location: [facility];   Service: General;  Laterality: Right;   right knee meniscus     SBO  10/2016   small bowel obstruction   SIMPLE MASTECTOMY WITH AXILLARY SENTINEL NODE BIOPSY Right 07/26/2019   Procedure: RIGHT MASTECTOMY WITH SENTINEL NODE BIOPSY;  Surgeon: Griselda Miner, MD;  Location: [facility];  Service: General;  Laterality: Right;   SPHINCTEROTOMY  06/27/2023   Procedure: SPHINCTEROTOMY;  Surgeon: Lynann Bologna, MD;  Location: [facility];  Service: Gastroenterology;;   Dennison Mascot  07/08/2023   Procedure: Dennison Mascot;  Surgeon: Lemar Lofty., MD;  Location: [facility];  Service: Gastroenterology;;   TOTAL HIP ARTHROPLASTY Left 10/01/2017   Procedure: LEFT  TOTAL HIP ARTHROPLASTY ANTERIOR APPROACH;  Surgeon: Ollen Gross, MD;  Location: [facility];  Service: Orthopedics;  Laterality: Left;   TOTAL HIP ARTHROPLASTY Right 03/11/2018   Procedure: RIGHT TOTAL HIP ARTHROPLASTY ANTERIOR APPROACH;  Surgeon: Ollen Gross, MD;  Location: [facility];  Service: Orthopedics;  Laterality: Right;    UPPER GASTROINTESTINAL ENDOSCOPY      I have reviewed the social history and family history with the patient and they are unchanged from previous  note.  ALLERGIES:  is allergic to lisinopril, statins, and atorvastatin.  MEDICATIONS:  Current Outpatient Medications  Medication Sig Dispense Refill   celecoxib (CELEBREX) 100 MG capsule Take 100 mg by mouth 2 (two) times daily. (Patient not taking: Reported on 07/07/2023)     cetirizine (ZYRTEC) 10 MG tablet Take 1 tablet (10 mg total) by mouth daily. (Patient not taking: Reported on 07/07/2023) 30 tablet 11   Cholecalciferol (VITAMIN D-3) 125 MCG (5000 UT) TABS Take 1 tablet by mouth daily.     famotidine (PEPCID) 20 MG tablet Take 1 tablet (20 mg total) by mouth 2 (two) times daily. (Patient taking differently: Take 20 mg by mouth daily.) 30 tablet 1   fluticasone (FLONASE) 50 MCG/ACT nasal spray Place 2 sprays into both nostrils daily.  16 g 6   Glucose Blood (BLOOD GLUCOSE TEST STRIPS) STRP 1 each by In Vitro route in the morning, at noon, and at bedtime. May substitute to any manufacturer covered by patient's insurance. 100 strip 5   insulin aspart (NOVOLOG) 100 UNIT/ML FlexPen Inject 0-6 Units into the skin 3 (three) times daily with meals. Check Blood Glucose (BG) and inject per scale: BG <150= 0 unit; BG 150-200= 1 unit; BG 201-250= 2 unit; BG 251-300= 3 unit; BG 301-350= 4 unit; BG 351-400= 5 unit; BG >400= 6 unit and Call Primary Care. 15 mL 0   Insulin Glargine (BASAGLAR KWIKPEN) 100 UNIT/ML Inject 12 Units into the skin daily. May substitute as needed per insurance. (Patient taking differently: Inject 12 Units into the skin at bedtime.) 15 mL 2   Lancets (ONETOUCH DELICA PLUS LANCET33G) MISC USE TO CHECK BLOOD SUGAR IN THE MORNING, AT NOON, AND AT BEDTIME. 100 each 0   Lancets Misc. MISC 1 each by Does not apply route in the morning, at noon, and at bedtime. May substitute to any manufacturer covered by patient's insurance. 100 each 5   losartan (COZAAR) 100 MG tablet Take 1 tablet by mouth once daily 90 tablet 0   thyroid (NP THYROID) 90 MG tablet Take 1 tablet (90 mg total) by mouth daily. 30 tablet 5   No current facility-administered medications for this visit.    PHYSICAL EXAMINATION: ECOG PERFORMANCE STATUS: 1 - Symptomatic but completely ambulatory  Vitals:   07/23/23 1139 07/23/23 1141  BP: (!) 189/95 (!) 177/83  Pulse: (!) 58   Resp: 18   Temp: (!) 97.3 F (36.3 C)   SpO2: 99%    Wt Readings from Last 3 Encounters:  07/23/23 164 lb 14.4 oz (74.8 kg)  07/08/23 168 lb 6.9 oz (76.4 kg)  07/07/23 168 lb 6.4 oz (76.4 kg)     GENERAL:alert, no distress and comfortable SKIN: skin color, texture, turgor are normal, no rashes or significant lesions EYES: normal, Conjunctiva are pink and non-injected, sclera clear NECK: supple, thyroid normal size, non-tender, without nodularity LYMPH:  no palpable  lymphadenopathy in the cervical, axillary  LUNGS: clear to auscultation and percussion with normal breathing effort HEART: regular rate & rhythm and no murmurs and no lower extremity edema ABDOMEN:abdomen soft, non-tender and normal bowel sounds Musculoskeletal:no cyanosis of digits and no clubbing  NEURO: alert & oriented x 3 with fluent speech, no focal motor/sensory deficits  LABORATORY DATA:  I have reviewed the data as listed    Latest Ref Rng & Units 07/23/2023   11:13 AM 07/09/2023    6:04 AM 07/08/2023    5:41 AM  CBC  WBC 4.0 - 10.5 K/uL 8.0  5.8  7.8   Hemoglobin 12.0 - 15.0 g/dL 16.1  09.6  04.5   Hematocrit 36.0 - 46.0 % 36.4  31.4  31.9   Platelets 150 - 400 K/uL 261  211  218         Latest Ref Rng & Units 07/23/2023   11:13 AM 07/09/2023    6:04 AM 07/08/2023    5:41 AM  CMP  Glucose 70 - 99 mg/dL 409  811  914   BUN 8 - 23 mg/dL 13  <5  16   Creatinine 0.44 - 1.00 mg/dL 7.82  9.56  2.13   Sodium 135 - 145 mmol/L 145  136  135   Potassium 3.5 - 5.1 mmol/L 3.7  3.7  3.0   Chloride 98 - 111 mmol/L 109  108  108   CO2 22 - 32 mmol/L 29  18  19    Calcium 8.9 - 10.3 mg/dL 9.5  8.1  8.3   Total Protein 6.5 - 8.1 g/dL 7.0  5.5  6.4   Total Bilirubin 0.0 - 1.2 mg/dL 1.7  4.8  8.2   Alkaline Phos 38 - 126 U/L 607  816  848   AST 15 - 41 U/L 78  197  203   ALT 0 - 44 U/L 165  284  298       RADIOGRAPHIC STUDIES: I have personally reviewed the radiological images as listed and agreed with the findings in the report. No results found.    Orders Placed This Encounter  Procedures   CBC with Differential (Cancer Center Only)    Standing Status:   Future    Expected Date:   07/30/2023    Expiration Date:   07/29/2024   CMP (Cancer Center only)    Standing Status:   Future    Expected Date:   07/30/2023    Expiration Date:   07/29/2024   CBC with Differential (Cancer Center Only)    Standing Status:   Future    Expected Date:   08/13/2023    Expiration Date:   08/12/2024    CMP (Cancer Center only)    Standing Status:   Future    Expected Date:   08/13/2023    Expiration Date:   08/12/2024   CBC with Differential (Cancer Center Only)    Standing Status:   Future    Expected Date:   08/27/2023    Expiration Date:   08/26/2024   CMP (Cancer Center only)    Standing Status:   Future    Expected Date:   08/27/2023    Expiration Date:   08/26/2024   CBC with Differential (Cancer Center Only)    Standing Status:   Future    Expected Date:   09/10/2023    Expiration Date:   09/09/2024   CMP (Cancer Center only)    Standing Status:   Future    Expected Date:   09/10/2023    Expiration Date:   09/09/2024   Ambulatory referral to General Surgery    Referral Priority:   Routine    Referral Type:   Surgical    Referral Reason:   Specialty Services Required    Requested Specialty:   General Surgery    Number of Visits Requested:   1   Ambulatory Referral to [facility]    Referral Priority:   Routine    Referral Type:   Consultation    Referral Reason:   Specialty Services Required  Number of Visits Requested:   1   All questions were answered. The patient knows to call the clinic with any problems, questions or concerns. No barriers to learning was detected. The total time spent in the appointment was 40 minutes.     Malachy Mood, MD 07/23/2023

## 2023-07-23 NOTE — Progress Notes (Signed)
START ON PATHWAY REGIMEN - Pancreatic Adenocarcinoma     A cycle is every 14 days:     Irinotecan      Oxaliplatin      Leucovorin      Fluorouracil   **Always confirm dose/schedule in your pharmacy ordering system**  Patient Characteristics: Preoperative, M0 (Clinical Staging), Borderline Resectable, PS = 0,1, BRCA1/2 and PALB2 Mutation Absent/Unknown Therapeutic Status: Preoperative, M0 (Clinical Staging) AJCC T Category: cT2 AJCC N Category: cN0 Resectability Status: Borderline Resectable AJCC M Category: cM0 AJCC 8 Stage Grouping: IB ECOG Performance Status: 0 BRCA1/2 Mutation Status: Awaiting Test Results PALB2 Mutation Status: Awaiting Test Results Intent of Therapy: Curative Intent, Discussed with Patient

## 2023-07-24 ENCOUNTER — Ambulatory Visit: Payer: PPO

## 2023-07-24 ENCOUNTER — Other Ambulatory Visit: Payer: Self-pay

## 2023-07-24 VITALS — BP 175/78 | HR 61 | Temp 97.9°F | Resp 16 | Ht 63.5 in | Wt 165.8 lb

## 2023-07-24 DIAGNOSIS — E782 Mixed hyperlipidemia: Secondary | ICD-10-CM | POA: Diagnosis not present

## 2023-07-24 MED ORDER — SODIUM CHLORIDE 0.9 % IV SOLN: Freq: Once | INTRAVENOUS | Status: DC | PRN

## 2023-07-24 MED ORDER — ANTICOAGULANT SODIUM CITRATE 4% (200MG/5ML) IV SOLN: 5.0000 mL | Freq: Once | Status: DC | PRN

## 2023-07-24 MED ORDER — ALBUTEROL SULFATE HFA 108 (90 BASE) MCG/ACT IN AERS: 2.0000 | INHALATION_SPRAY | Freq: Once | RESPIRATORY_TRACT | Status: DC | PRN

## 2023-07-24 MED ORDER — DIPHENHYDRAMINE HCL 50 MG/ML IJ SOLN: 50.0000 mg | Freq: Once | INTRAMUSCULAR | Status: DC | PRN

## 2023-07-24 MED ORDER — INCLISIRAN SODIUM 284 MG/1.5ML ~~LOC~~ SOSY
284.0000 mg | PREFILLED_SYRINGE | Freq: Once | SUBCUTANEOUS | Status: AC
  Administered 2023-07-24: 284 mg via SUBCUTANEOUS
  Filled 2023-07-24: qty 1.5

## 2023-07-24 MED ORDER — HEPARIN SOD (PORK) LOCK FLUSH 100 UNIT/ML IV SOLN: 500.0000 [IU] | Freq: Once | INTRAVENOUS | Status: DC | PRN

## 2023-07-24 MED ORDER — SODIUM CHLORIDE 0.9% FLUSH: 10.0000 mL | Freq: Once | INTRAVENOUS | Status: DC | PRN

## 2023-07-24 MED ORDER — ALTEPLASE 2 MG IJ SOLR: 2.0000 mg | Freq: Once | INTRAMUSCULAR | Status: DC | PRN

## 2023-07-24 MED ORDER — SODIUM CHLORIDE 0.9% FLUSH: 3.0000 mL | Freq: Once | INTRAVENOUS | Status: DC | PRN

## 2023-07-24 MED ORDER — HEPARIN SOD (PORK) LOCK FLUSH 100 UNIT/ML IV SOLN: 250.0000 [IU] | Freq: Once | INTRAVENOUS | Status: DC | PRN

## 2023-07-24 MED ORDER — INCLISIRAN SODIUM 284 MG/1.5ML ~~LOC~~ SOSY
284.0000 mg | PREFILLED_SYRINGE | Freq: Once | SUBCUTANEOUS | Status: DC
  Filled 2023-07-24: qty 1.5

## 2023-07-24 MED ORDER — FAMOTIDINE IN NACL 20-0.9 MG/50ML-% IV SOLN: 20.0000 mg | Freq: Once | INTRAVENOUS | Status: DC | PRN

## 2023-07-24 MED ORDER — METHYLPREDNISOLONE SODIUM SUCC 125 MG IJ SOLR: 125.0000 mg | Freq: Once | INTRAMUSCULAR | Status: DC | PRN

## 2023-07-24 MED ORDER — EPINEPHRINE 0.3 MG/0.3ML IJ SOAJ: 0.3000 mg | Freq: Once | INTRAMUSCULAR | Status: DC | PRN

## 2023-07-24 NOTE — Progress Notes (Signed)
Diagnosis: Hyperlipidemia  Provider:  Chilton Greathouse MD  Procedure: Injection  Leqvio (inclisiran), Dose: 284 mg, Site: subcutaneous, Number of injections: 1  Injection Site(s): Left arm  Post Care: Observation period completed   patient wanted to 15 min.post injection  Discharge: Condition: Good, Destination: Home . AVS Provided  Performed by:  Marlow Baars [place], RN

## 2023-07-25 ENCOUNTER — Other Ambulatory Visit: Payer: Self-pay

## 2023-07-28 ENCOUNTER — Other Ambulatory Visit: Payer: Self-pay | Admitting: Physician Assistant

## 2023-07-28 ENCOUNTER — Other Ambulatory Visit: Payer: Self-pay | Admitting: Genetic Counselor

## 2023-07-28 ENCOUNTER — Other Ambulatory Visit: Payer: Self-pay

## 2023-07-28 DIAGNOSIS — C259 Malignant neoplasm of pancreas, unspecified: Secondary | ICD-10-CM

## 2023-07-28 DIAGNOSIS — Z01818 Encounter for other preprocedural examination: Secondary | ICD-10-CM

## 2023-07-28 NOTE — Telephone Encounter (Signed)
Copied from CRM [id number]. Topic: Clinical - Medical Advice >> Jul 25, 2023  2:30 PM Geroge Baseman wrote: Reason for CRM: Patient wanted to make sure that someone is seeing that she has been in the hospital recently and they had changed some medication. While she was in the hospital they changed several of her medications and she just wants to be sure of how she should continue taking her medications. She would like to be called back ASAP so that she knows how to proceed [phone]. She said her readings of her blood sugar have been staying in a good range.

## 2023-07-29 ENCOUNTER — Encounter (HOSPITAL_COMMUNITY): Payer: Self-pay

## 2023-07-29 ENCOUNTER — Ambulatory Visit (HOSPITAL_COMMUNITY)
Admission: RE | Admit: 2023-07-29 | Discharge: 2023-07-29 | Disposition: A | Discharge status: Home / Self Care | Payer: PPO | Source: Ambulatory Visit | Attending: Nurse Practitioner

## 2023-07-29 ENCOUNTER — Other Ambulatory Visit: Payer: Self-pay

## 2023-07-29 ENCOUNTER — Ambulatory Visit (HOSPITAL_COMMUNITY)
Admission: RE | Admit: 2023-07-29 | Discharge: 2023-07-29 | Disposition: A | Discharge status: Home / Self Care | Payer: PPO | Source: Ambulatory Visit | Attending: Nurse Practitioner | Admitting: Nurse Practitioner

## 2023-07-29 ENCOUNTER — Encounter: Payer: Self-pay | Admitting: Internal Medicine

## 2023-07-29 ENCOUNTER — Other Ambulatory Visit (HOSPITAL_COMMUNITY): Payer: PPO

## 2023-07-29 DIAGNOSIS — G4733 Obstructive sleep apnea (adult) (pediatric): Secondary | ICD-10-CM | POA: Insufficient documentation

## 2023-07-29 DIAGNOSIS — I110 Hypertensive heart disease with heart failure: Secondary | ICD-10-CM | POA: Insufficient documentation

## 2023-07-29 DIAGNOSIS — Z9011 Acquired absence of right breast and nipple: Secondary | ICD-10-CM | POA: Insufficient documentation

## 2023-07-29 DIAGNOSIS — Z794 Long term (current) use of insulin: Secondary | ICD-10-CM | POA: Insufficient documentation

## 2023-07-29 DIAGNOSIS — E119 Type 2 diabetes mellitus without complications: Secondary | ICD-10-CM | POA: Insufficient documentation

## 2023-07-29 DIAGNOSIS — I509 Heart failure, unspecified: Secondary | ICD-10-CM | POA: Diagnosis not present

## 2023-07-29 DIAGNOSIS — C259 Malignant neoplasm of pancreas, unspecified: Secondary | ICD-10-CM | POA: Diagnosis not present

## 2023-07-29 DIAGNOSIS — Z87891 Personal history of nicotine dependence: Secondary | ICD-10-CM | POA: Insufficient documentation

## 2023-07-29 DIAGNOSIS — K219 Gastro-esophageal reflux disease without esophagitis: Secondary | ICD-10-CM | POA: Insufficient documentation

## 2023-07-29 DIAGNOSIS — Z01818 Encounter for other preprocedural examination: Secondary | ICD-10-CM

## 2023-07-29 DIAGNOSIS — Z853 Personal history of malignant neoplasm of breast: Secondary | ICD-10-CM | POA: Insufficient documentation

## 2023-07-29 DIAGNOSIS — C250 Malignant neoplasm of head of pancreas: Secondary | ICD-10-CM | POA: Diagnosis not present

## 2023-07-29 DIAGNOSIS — J432 Centrilobular emphysema: Secondary | ICD-10-CM | POA: Diagnosis not present

## 2023-07-29 HISTORY — PX: IR IMAGING GUIDED PORT INSERTION: IMG5740

## 2023-07-29 LAB — GLUCOSE, CAPILLARY: Glucose-Capillary: 103 mg/dL — ABNORMAL HIGH (ref 70–99)

## 2023-07-29 MED ORDER — FENTANYL CITRATE (PF) 100 MCG/2ML IJ SOLN
INTRAMUSCULAR | Status: AC
  Filled 2023-07-29: qty 2

## 2023-07-29 MED ORDER — MIDAZOLAM HCL 2 MG/2ML IJ SOLN
INTRAMUSCULAR | Status: AC
  Filled 2023-07-29: qty 2

## 2023-07-29 MED ORDER — SODIUM CHLORIDE 0.9 % IV SOLN: INTRAVENOUS | Status: DC

## 2023-07-29 MED ORDER — MIDAZOLAM HCL 2 MG/2ML IJ SOLN
INTRAMUSCULAR | Status: AC | PRN
  Administered 2023-07-29 (×2): 1 mg via INTRAVENOUS

## 2023-07-29 MED ORDER — HEPARIN SOD (PORK) LOCK FLUSH 100 UNIT/ML IV SOLN
500.0000 [IU] | Freq: Once | INTRAVENOUS | Status: AC
  Administered 2023-07-29: 500 [IU] via INTRAVENOUS

## 2023-07-29 MED ORDER — SODIUM CHLORIDE 0.9% FLUSH: 3.0000 mL | Freq: Two times a day (BID) | INTRAVENOUS | Status: DC

## 2023-07-29 MED ORDER — FENTANYL CITRATE (PF) 100 MCG/2ML IJ SOLN
INTRAMUSCULAR | Status: AC | PRN
  Administered 2023-07-29 (×2): 50 ug via INTRAVENOUS

## 2023-07-29 MED ORDER — LIDOCAINE-EPINEPHRINE 1 %-1:100000 IJ SOLN
INTRAMUSCULAR | Status: AC
Start: 2023-07-29 — End: ?
  Filled 2023-07-29: qty 1

## 2023-07-29 MED ORDER — SODIUM CHLORIDE 0.9% FLUSH: 3.0000 mL | INTRAVENOUS | Status: DC | PRN

## 2023-07-29 MED ORDER — HEPARIN SOD (PORK) LOCK FLUSH 100 UNIT/ML IV SOLN
INTRAVENOUS | Status: AC
  Filled 2023-07-29: qty 5

## 2023-07-29 MED ORDER — LIDOCAINE-EPINEPHRINE 1 %-1:100000 IJ SOLN
20.0000 mL | Freq: Once | INTRAMUSCULAR | Status: AC
  Administered 2023-07-29: 17 mL via INTRADERMAL

## 2023-07-29 NOTE — Progress Notes (Signed)
1515 Ice bag given to use for comfort to left upper neck and left upper chest.

## 2023-07-29 NOTE — Discharge Instructions (Addendum)
 Discharge Instructions:   Please call Interventional Radiology clinic [phone] with any questions or concerns.  You may remove your bandaid to the left upper neck and left upper chest dressing and shower tomorrow.  Do not use EMLA  / Lidocaine  cream for 2 weeks post Port Insertion this will remove the surgical glue.   Moderate Conscious Sedation, Adult, Care After  This sheet gives you information about how to care for yourself after your procedure. Your health care provider may also give you more specific instructions. If you have problems or questions, contact your health care provider. What can I expect after the procedure? After the procedure, it is common to have: Sleepiness for several hours. Impaired judgment for several hours. Difficulty with balance. Vomiting if you eat too soon. Follow these instructions at home: For the time period you were told by your health care provider:Implanted Port Insertion, Care After The following information offers guidance on how to care for yourself after your procedure. Your health care provider may also give you more specific instructions. If you have problems or questions, contact your health care provider. What can I expect after the procedure? After the procedure, it is common to have: Discomfort at the port insertion site. Bruising on the skin over the port. This should improve over 3-4 days. Follow these instructions at home: [facility] care After your port is placed, you will get a manufacturer's information card. The card has information about your port. Keep this card with you at all times. Take care of the port as told by your health care provider. Ask your health care provider if you or a family member can get training for taking care of the port at home. A home health care nurse will be be available to help care for the port. Make sure to remember what type of port you have. Incision care     Follow instructions from your health  care provider about how to take care of your port insertion site. Make sure you: Wash your hands with soap and water  for at least 20 seconds before and after you change your bandage (dressing). If soap and water  are not available, use hand sanitizer. Change your dressing as told by your health care provider. Leave stitches (sutures), skin glue, or adhesive strips in place. These skin closures may need to stay in place for 2 weeks or longer. If adhesive strip edges start to loosen and curl up, you may trim the loose edges. Do not remove adhesive strips completely unless your health care provider tells you to do that. Check your port insertion site every day for signs of infection. Check for: Redness, swelling, or pain. Fluid or blood. Warmth. Pus or a bad smell. Activity Return to your normal activities as told by your health care provider. Ask your health care provider what activities are safe for you. You may have to avoid lifting. Ask your health care provider how much you can safely lift. General instructions Take over-the-counter and prescription medicines only as told by your health care provider. Do not take baths, swim, or use a hot tub until your health care provider approves. Ask your health care provider if you may take showers. You may only be allowed to take sponge baths. If you were given a sedative during the procedure, it can affect you for several hours. Do not drive or operate machinery until your health care provider says that it is safe. Wear a medical alert bracelet in case of an emergency. This will  tell any health care providers that you have a port. Keep all follow-up visits. This is important. Contact a health care provider if: You cannot flush your port with saline as directed, or you cannot draw blood from the port. You have a fever or chills. You have redness, swelling, or pain around your port insertion site. You have fluid or blood coming from your port insertion  site. Your port insertion site feels warm to the touch. You have pus or a bad smell coming from the port insertion site. Get help right away if: You have chest pain or shortness of breath. You have bleeding from your port that you cannot control. These symptoms may be an emergency. Get help right away. Call 911. Do not wait to see if the symptoms will go away. Do not drive yourself to the hospital. Summary Take care of the port as told by your health care provider. Keep the manufacturer's information card with you at all times. Change your dressing as told by your health care provider. Contact a health care provider if you have a fever or chills or if you have redness, swelling, or pain around your port insertion site. Keep all follow-up visits. This information is not intended to replace advice given to you by your health care provider. Make sure you discuss any questions you have with your health care provider. Document Revised: 12/12/2020 Document Reviewed: 12/12/2020 Elsevier Patient Education  2023 [employer] Rest. Do not participate in activities where you could fall or become injured. Do not drive or use machinery. Do not drink alcohol. Do not take sleeping pills or medicines that cause drowsiness. Do not make important decisions or sign legal documents. Do not take care of children on your own. Eating and drinking  Follow the diet recommended by your health care provider. Drink enough fluid to keep your urine pale yellow. If you vomit: Drink water , juice, or soup when you can drink without vomiting. Make sure you have little or no nausea before eating solid foods. General instructions Take over-the-counter and prescription medicines only as told by your health care provider. Have a responsible adult stay with you for the time you are told. It is important to have someone help care for you until you are awake and alert. Do not smoke. Keep all follow-up visits as told by  your health care provider. This is important. Contact a health care provider if: You are still sleepy or having trouble with balance after 24 hours. You feel light-headed. You keep feeling nauseous or you keep vomiting. You develop a rash. You have a fever. You have redness or swelling around the IV site. Get help right away if: You have trouble breathing. You have new-onset confusion at home. Summary After the procedure, it is common to feel sleepy, have impaired judgment, or feel nauseous if you eat too soon. Rest after you get home. Know the things you should not do after the procedure. Follow the diet recommended by your health care provider and drink enough fluid to keep your urine pale yellow. Get help right away if you have trouble breathing or new-onset confusion at home. This information is not intended to replace advice given to you by your health care provider. Make sure you discuss any questions you have with your health care provider. Document Revised: 10/08/2019 Document Reviewed: 05/06/2019 Elsevier Patient Education  2023 [employer]  Implanted [place] Insertion, Care After  The following information offers guidance on how to care for yourself after  your procedure. Your health care provider may also give you more specific instructions. If you have problems or questions, contact your health care provider. What can I expect after the procedure? After the procedure, it is common to have: Discomfort at the port insertion site. Bruising on the skin over the port. This should improve over 3-4 days. Follow these instructions at home: [facility] care After your port is placed, you will get a manufacturer's information card. The card has information about your port. Keep this card with you at all times. Take care of the port as told by your health care provider. Ask your health care provider if you or a family member can get training for taking care of the port at home. A home health  care nurse will be be available to help care for the port. Make sure to remember what type of port you have. Incision care     Follow instructions from your health care provider about how to take care of your port insertion site. Make sure you: Wash your hands with soap and water  for at least 20 seconds before and after you change your bandage (dressing). If soap and water  are not available, use hand sanitizer. Change your dressing as told by your health care provider. Leave stitches (sutures), skin glue, or adhesive strips in place. These skin closures may need to stay in place for 2 weeks or longer. If adhesive strip edges start to loosen and curl up, you may trim the loose edges. Do not remove adhesive strips completely unless your health care provider tells you to do that. Check your port insertion site every day for signs of infection. Check for: Redness, swelling, or pain. Fluid or blood. Warmth. Pus or a bad smell. Activity Return to your normal activities as told by your health care provider. Ask your health care provider what activities are safe for you. You may have to avoid lifting. Ask your health care provider how much you can safely lift. General instructions Take over-the-counter and prescription medicines only as told by your health care provider. Do not take baths, swim, or use a hot tub until your health care provider approves. Ask your health care provider if you may take showers. You may only be allowed to take sponge baths. If you were given a sedative during the procedure, it can affect you for several hours. Do not drive or operate machinery until your health care provider says that it is safe. Wear a medical alert bracelet in case of an emergency. This will tell any health care providers that you have a port. Keep all follow-up visits. This is important. Contact a health care provider if: You cannot flush your port with saline as directed, or you cannot draw blood  from the port. You have a fever or chills. You have redness, swelling, or pain around your port insertion site. You have fluid or blood coming from your port insertion site. Your port insertion site feels warm to the touch. You have pus or a bad smell coming from the port insertion site. Get help right away if: You have chest pain or shortness of breath. You have bleeding from your port that you cannot control. These symptoms may be an emergency. Get help right away. Call 911. Do not wait to see if the symptoms will go away. Do not drive yourself to the hospital. Summary Take care of the port as told by your health care provider. Keep the manufacturer's information card with you at  all times. Change your dressing as told by your health care provider. Contact a health care provider if you have a fever or chills or if you have redness, swelling, or pain around your port insertion site. Keep all follow-up visits. This information is not intended to replace advice given to you by your health care provider. Make sure you discuss any questions you have with your health care provider. Document Revised: 12/12/2020 Document Reviewed: 12/12/2020 Elsevier Patient Education  2023 [employer].

## 2023-07-29 NOTE — H&P (Addendum)
 Chief Complaint: Patient was seen in consultation today for pancreatic head neoplasm   at the request of Burton,Lacie K  Referring Physician(s): Burton,Lacie K  Supervising Physician: Luverne Aran  Patient Status: [facility] - Out-pt  Full Code  History of Present Illness: Adrienne [name] is a 78 y.o. female with history of HTN, HL, DM type 2, GERD, breast cancer-right sided mastectomy, OSA, SBO, CHF, emphysema, and arthritis. Patient initially presented to the emergency room 06/13/23 with jaundice. CT AP 06/13/23 reported a vague hypodense region in the head of the pancreas with biliary and pancreatic ductal dilatation. These findings were concerning for pancreatic adenocarcinoma. A further MRI 06/14/23 reported the same mass, which measured 2.5 x 2.0 cm. MRI also reported severe pancreatic ductal dilatation distally, measuring 1.3 cm in caliber and mild pancreatic parenchymal atrophy. ERCP with stenting of the pancreatic duct was also performed 12/21.  Pet scan was performed 07/17/23, which also reported a ill-defined pancreatic head mass and associated atrophy/duct dilation throughout the pancreatic head and tail. Patient is currently followed by Dr Onita mattock. Patient was referred for a port placement to assist with treatment.   Patient reports being here for a port placement to assist with treatment. Patient has her daughter with her today. Patient denies new chest pain, shortness of breath, vomiting, diarrhea, abdominal pain, fever and/or chills.   Past Medical History:  Diagnosis Date   Allergy    Ambulates with cane    straight cane   Arthritis    knee, back   CHF (congestive heart failure) (HCC)    Ductal carcinoma in situ of breast 12/2018   R Breast-mastectomy only   Gout    History of blood transfusion 1986   w/ Hysterectomy surgery   Hyperlipidemia    diet controlled - no meds   Hypertension    Hypothyroidism    Pelvic kidney    lower right pelvic kidney    SBO  (small bowel obstruction) (HCC) 10/2016   surgery    Sleep apnea    Mild - no mask needed per sleep study   Smoker    quit smoking 2018   Type 2 diabetes mellitus (HCC)     Past Surgical History:  Procedure Laterality Date   ABDOMINAL HYSTERECTOMY  1986   COMPLETE-precancerous   APPENDECTOMY     pt states it was removed when gallbladder was removed.   AUGMENTATION MAMMAPLASTY     BACK SURGERY     lower back   BILIARY BRUSHING  07/08/2023   Procedure: BILIARY BRUSHING;  Surgeon: Wilhelmenia Aloha Raddle., MD;  Location: [facility];  Service: Gastroenterology;;   BILIARY STENT PLACEMENT N/A 07/08/2023   Procedure: BILIARY STENT PLACEMENT;  Surgeon: Wilhelmenia Aloha Raddle., MD;  Location: [facility];  Service: Gastroenterology;  Laterality: N/A;   BIOPSY  06/14/2023   Procedure: BIOPSY;  Surgeon: Avram Lupita BRAVO, MD;  Location: [facility];  Service: Gastroenterology;;   BIOPSY  07/08/2023   Procedure: BIOPSY;  Surgeon: Wilhelmenia Aloha Raddle., MD;  Location: [facility];  Service: Gastroenterology;;   BREAST BIOPSY Right 05/12/2019   times 2   BREAST LUMPECTOMY WITH RADIOACTIVE SEED LOCALIZATION Right 03/18/2019   Procedure: RIGHT BREAST LUMPECTOMY WITH RADIOACTIVE SEED LOCALIZATION;  Surgeon: [name], MD;  Location: [facility];  Service: General;  Laterality: Right;   CHOLECYSTECTOMY     COLONOSCOPY  02/15/2008   Kaplan    COLONOSCOPY WITH PROPOFOL   02/27/2018   Dr.Nandigam   ECTOPIC PREGNANCY SURGERY  ERCP N/A 06/14/2023   Procedure: ENDOSCOPIC RETROGRADE CHOLANGIOPANCREATOGRAPHY (ERCP);  Surgeon: Avram Lupita BRAVO, MD;  Location: [facility];  Service: Gastroenterology;  Laterality: N/A;   ERCP N/A 06/27/2023   Procedure: ENDOSCOPIC RETROGRADE CHOLANGIOPANCREATOGRAPHY (ERCP);  Surgeon: Charlanne Groom, MD;  Location: [facility];  Service: Gastroenterology;  Laterality: N/A;   ERCP N/A 07/08/2023   Procedure: ENDOSCOPIC RETROGRADE CHOLANGIOPANCREATOGRAPHY (ERCP);  Surgeon:  Wilhelmenia Aloha Raddle., MD;  Location: [facility];  Service: Gastroenterology;  Laterality: N/A;   ESOPHAGOGASTRODUODENOSCOPY N/A 07/08/2023   Procedure: ESOPHAGOGASTRODUODENOSCOPY (EGD);  Surgeon: Wilhelmenia Aloha Raddle., MD;  Location: [facility];  Service: Gastroenterology;  Laterality: N/A;   ESOPHAGOGASTRODUODENOSCOPY (EGD) WITH PROPOFOL  N/A 06/14/2023   Procedure: ESOPHAGOGASTRODUODENOSCOPY (EGD) WITH PROPOFOL ;  Surgeon: Avram Lupita BRAVO, MD;  Location: [facility];  Service: Gastroenterology;  Laterality: N/A;   EUS N/A 07/08/2023   Procedure: UPPER ENDOSCOPIC ULTRASOUND (EUS) LINEAR;  Surgeon: Wilhelmenia Aloha Raddle., MD;  Location: [facility];  Service: Gastroenterology;  Laterality: N/A;   FINE NEEDLE ASPIRATION N/A 07/08/2023   Procedure: FINE NEEDLE ASPIRATION (FNA) LINEAR;  Surgeon: Wilhelmenia Aloha Raddle., MD;  Location: [facility];  Service: Gastroenterology;  Laterality: N/A;   JOINT REPLACEMENT     Left hip total Dr. Melodi 10-01-17   MALONEY DILATION  06/14/2023   Procedure: AGAPITO DILATION;  Surgeon: Avram Lupita BRAVO, MD;  Location: [facility];  Service: Gastroenterology;;   MASTECTOMY Right 07/26/2019   PANCREATIC STENT PLACEMENT  06/14/2023   Procedure: PANCREATIC STENT PLACEMENT;  Surgeon: Avram Lupita BRAVO, MD;  Location: [facility];  Service: Gastroenterology;;   PANCREATIC STENT PLACEMENT  06/27/2023   Procedure: PANCREATIC STENT PLACEMENT;  Surgeon: Charlanne Groom, MD;  Location: [facility];  Service: Gastroenterology;;   POLYPECTOMY     polypectomy-oropharynx     RE-EXCISION OF BREAST CANCER,SUPERIOR MARGINS Right 04/08/2019   Procedure: RE-EXCISION OF RIGHT BREAST ANTERIOR MARGINS;  Surgeon: Curvin Mt III, MD;  Location: [facility];  Service: General;  Laterality: Right;   right knee meniscus     SBO  10/2016   small bowel obstruction   SIMPLE MASTECTOMY WITH AXILLARY SENTINEL NODE BIOPSY Right 07/26/2019   Procedure: RIGHT MASTECTOMY WITH SENTINEL NODE  BIOPSY;  Surgeon: Curvin Mt MOULD, MD;  Location: [facility];  Service: General;  Laterality: Right;   SPHINCTEROTOMY  06/27/2023   Procedure: SPHINCTEROTOMY;  Surgeon: Charlanne Groom, MD;  Location: [facility];  Service: Gastroenterology;;   ANNETT  07/08/2023   Procedure: ANNETT;  Surgeon: Wilhelmenia Aloha Raddle., MD;  Location: [facility];  Service: Gastroenterology;;   TOTAL HIP ARTHROPLASTY Left 10/01/2017   Procedure: LEFT  TOTAL HIP ARTHROPLASTY ANTERIOR APPROACH;  Surgeon: Melodi Lerner, MD;  Location: [facility];  Service: Orthopedics;  Laterality: Left;   TOTAL HIP ARTHROPLASTY Right 03/11/2018   Procedure: RIGHT TOTAL HIP ARTHROPLASTY ANTERIOR APPROACH;  Surgeon: Melodi Lerner, MD;  Location: [facility];  Service: Orthopedics;  Laterality: Right;    UPPER GASTROINTESTINAL ENDOSCOPY      Allergies: Lisinopril , Statins, and Atorvastatin   Medications: Prior to Admission medications   Medication Sig Start Date End Date Taking? Authorizing Provider  Glucose Blood (BLOOD GLUCOSE TEST STRIPS) STRP 1 each by In Vitro route in the morning, at noon, and at bedtime. May substitute to any manufacturer covered by patient's insurance. 07/22/23  Yes [name], MD  insulin  aspart (NOVOLOG ) 100 UNIT/ML FlexPen Inject 0-6 Units into the skin 3 (three) times daily with meals. Check Blood Glucose (BG) and inject per scale: BG <150=  0 unit; BG 150-200= 1 unit; BG 201-250= 2 unit; BG 251-300= 3 unit; BG 301-350= 4 unit; BG 351-400= 5 unit; BG >400= 6 unit and Call Primary Care. 06/28/23  Yes Rai, Ripudeep K, MD  Insulin  Glargine (BASAGLAR  KWIKPEN) 100 UNIT/ML Inject 12 Units into the skin daily. May substitute as needed per insurance. Patient taking differently: Inject 12 Units into the skin at bedtime. 06/28/23  Yes Rai, Ripudeep K, MD  Lancets (ONETOUCH DELICA PLUS LANCET33G) MISC USE TO CHECK BLOOD SUGAR IN THE MORNING, AT NOON, AND AT BEDTIME. 07/21/23  Yes [name], MD  Lancets Misc. MISC 1 each by Does not apply route in the morning, at noon, and at bedtime. May substitute to any manufacturer covered by patient's insurance. 07/22/23 08/21/23 Yes [name], MD  losartan  (COZAAR ) 100 MG tablet Take 1 tablet by mouth once daily 05/05/23  Yes [name], MD  thyroid  (NP THYROID ) 90 MG tablet Take 1 tablet (90 mg total) by mouth daily. 02/14/23  Yes [name], MD  celecoxib  (CELEBREX ) 100 MG capsule Take 100 mg by mouth 2 (two) times daily. Patient not taking: Reported on 07/07/2023    [provider]  cetirizine  (ZYRTEC ) 10 MG tablet Take 1 tablet (10 mg total) by mouth daily. Patient not taking: Reported on 07/07/2023 05/15/23   Soldatova, Liuba, MD  Cholecalciferol (VITAMIN D -3) 125 MCG (5000 UT) TABS Take 1 tablet by mouth daily.   Yes [provider]  famotidine  (PEPCID ) 20 MG tablet Take 1 tablet (20 mg total) by mouth 2 (two) times daily. Patient taking differently: Take 20 mg by mouth daily. 05/15/23  Yes Soldatova, Liuba, MD  fluticasone  (FLONASE ) 50 MCG/ACT nasal spray Place 2 sprays into both nostrils daily. 05/15/23   Soldatova, Liuba, MD  lidocaine -prilocaine  (EMLA ) cream Apply 1 Application topically as needed. 07/23/23   [name], MD  ondansetron  (ZOFRAN ) 8 MG tablet Take 1 tablet (8 mg total) by mouth every 8 (eight) hours as needed for nausea or vomiting. 07/23/23   [name], MD  prochlorperazine  (COMPAZINE ) 10 MG tablet Take 1 tablet (10 mg total) by mouth every 6 (six) hours as needed for nausea or vomiting. 07/23/23   [name], MD     Family History  Problem Relation Age of Onset   Cancer Sister    Diabetes Brother    Diabetes Brother    Hypertension Other    Breast cancer Sister    Colon cancer Neg Hx    Colon polyps Neg Hx    Rectal cancer Neg Hx    Stomach cancer Neg Hx     Social History   Socioeconomic History   Marital status: Widowed    Spouse name: Not on file   Number of children: 3    Years of education: 17   Highest education level: Some college, no degree  Occupational History   Occupation: retired  Tobacco Use   Smoking status: Former    Current packs/day: 0.00    Average packs/day: 1 pack/day for 32.0 years (32.0 ttl pk-yrs)    Types: E-cigarettes, Cigarettes    Start date: 11/20/1984    Quit date: 11/20/2016    Years since quitting: 6.6   Smokeless tobacco: Never   Tobacco comments:    Smoker since 1975 has quit and started back several times    As of 07/03/23: smoked 30 years, 1 PPD, quit 2018  Vaping Use   Vaping status: Former  Substance and  Sexual Activity   Alcohol use: No   Drug use: No   Sexual activity: Not Currently    Birth control/protection: Surgical    Comment: Hysterectomy  Other Topics Concern   Not on file  Social History Narrative   Widow since 2006.Married previously for 25 years.Lives with grandson and 2 great grandchildren.Retired.Previously office work.   Social Drivers of Corporate Investment Banker Strain: Low Risk  (06/27/2021)   Overall Financial Resource Strain (CARDIA)    Difficulty of Paying Living Expenses: Not hard at all  Food Insecurity: No Food Insecurity (07/10/2023)   Hunger Vital Sign    Worried About Running Out of Food in the Last Year: Never true    Ran Out of Food in the Last Year: Never true  Transportation Needs: No Transportation Needs (07/10/2023)   PRAPARE - Administrator, Civil Service (Medical): No    Lack of Transportation (Non-Medical): No  Physical Activity: Inactive (06/27/2021)   Exercise Vital Sign    Days of Exercise per Week: 0 days    Minutes of Exercise per Session: 0 min  Stress: No Stress Concern Present (06/27/2021)   [employer] of Occupational Health - Occupational Stress Questionnaire    Feeling of Stress : Not at all  Social Connections: Moderately Integrated (07/07/2023)   Social Connection and Isolation Panel [NHANES]    Frequency of Communication with Friends and Family:  More than three times a week    Frequency of Social Gatherings with Friends and Family: Once a week    Attends Religious Services: More than 4 times per year    Active Member of [employer] or Organizations: Yes    Attends Banker Meetings: More than 4 times per year    Marital Status: Widowed     Review of Systems: A 12 point ROS discussed and pertinent positives are indicated in the HPI above.  All other systems are negative.  Review of Systems  Constitutional:  Negative for chills and fever.  Respiratory:  Negative for chest tightness and shortness of breath.   Cardiovascular:  Negative for chest pain.  Gastrointestinal:  Negative for abdominal pain, diarrhea and vomiting.  Psychiatric/Behavioral:  Negative for confusion.     Vital Signs: Pulse (!) 54   Temp 97.6 F (36.4 C) (Oral)   Resp 16   Ht 5' 3.5 (1.613 m)   Wt 165 lb 12.6 oz (75.2 kg)   SpO2 96%   BMI 28.91 kg/m   Advance Care Plan: The advanced care plan/surrogate decision maker was discussed at the time of visit and documented in the medical record.    Physical Exam HENT:     Head: Normocephalic and atraumatic.     Mouth/Throat:     Mouth: Mucous membranes are moist.     Pharynx: Oropharynx is clear.  Cardiovascular:     Rate and Rhythm: Normal rate and regular rhythm.  Pulmonary:     Effort: Pulmonary effort is normal. No respiratory distress.     Breath sounds: Normal breath sounds.  Abdominal:     Palpations: Abdomen is soft.     Tenderness: There is no abdominal tenderness.  Skin:    General: Skin is warm.  Neurological:     Mental Status: She is alert and oriented to person, place, and time.  Psychiatric:        Mood and Affect: Mood normal.        Thought Content: Thought content normal.  Judgment: Judgment normal.     Imaging: NM PET Image Initial (PI) Skull Base To Thigh Addendum Date: 07/25/2023 ADDENDUM REPORT: 07/25/2023 15:08 ADDENDUM: Fasting blood glucose 74 mg/dL  Electronically Signed   By: Selinda DELENA Blue M.D.   On: 07/25/2023 15:08   Result Date: 07/25/2023 CLINICAL DATA:  Initial treatment strategy for pancreatic head adenocarcinoma on 07/08/2023 biopsy with biliary stent placement. History of right breast cancer. EXAM: NUCLEAR MEDICINE PET SKULL BASE TO THIGH TECHNIQUE: 8.4 mCi F-18 FDG was injected intravenously. Full-ring PET imaging was performed from the skull base to thigh after the radiotracer. CT data was obtained and used for attenuation correction and anatomic localization. Fasting blood glucose:  mg/dl COMPARISON:  87/78/7975 MRI abdomen and 06/13/2023 CT abdomen/pelvis. 09/05/2022 high-resolution chest CT. FINDINGS: Mediastinal blood pool activity: SUV max 3.6 Liver activity: SUV max NA NECK: No enlarged or hypermetabolic neck lymph nodes. Incidental CT findings: None. CHEST: No enlarged or hypermetabolic axillary, mediastinal or hilar lymph nodes. No hypermetabolic pulmonary findings. Incidental CT findings: Right mastectomy. Coronary atherosclerosis. Atherosclerotic nonaneurysmal thoracic aorta. Borderline mild cardiomegaly. Mild centrilobular emphysema. ABDOMEN/PELVIS: Intensely hypermetabolic ill-defined slightly hypodense pancreatic head mass measuring approximately 3.3 x 2.9 cm with max SUV 10.2 (series 4/image 102). Associated diffuse main pancreatic duct dilation and parenchymal atrophy in the pancreatic body and tail. Left adrenal 1.2 cm nodule with density 15 HU with mild hypermetabolism with max SUV 4.5 (series 4/image 103). No abnormal hypermetabolic activity within the liver, right adrenal glands, or spleen. No hypermetabolic lymph nodes in the abdomen or pelvis. Incidental CT findings: Small hiatal hernia. Atherosclerotic nonaneurysmal abdominal aorta. SKELETON: No focal hypermetabolic activity to suggest skeletal metastasis. Incidental CT findings: Bilateral total hip arthroplasty. IMPRESSION: 1. Intensely hypermetabolic ill-defined pancreatic  head mass, compatible with known primary pancreatic adenocarcinoma. Associated atrophy and duct dilation throughout the pancreatic body and tail. 2. Mildly hypermetabolic left adrenal 1.2 cm nodule, indeterminate, potentially a lipid poor adenoma, cannot exclude adrenal metastasis. 3. No other potential sites of hypermetabolic metastatic disease. 4. Aortic Atherosclerosis (ICD10-I70.0) and Emphysema (ICD10-J43.9). Electronically Signed: By: Selinda DELENA Blue M.D. On: 07/25/2023 13:59   DG ERCP Result Date: 07/09/2023 CLINICAL DATA:  Malignant biliary stricture EXAM: ERCP TECHNIQUE: Multiple spot images obtained with the fluoroscopic device and submitted for interpretation post-procedure. FLUOROSCOPY: Radiation Exposure Index (as provided by the fluoroscopic device): 67.31 mGy Kerma COMPARISON:  Prior ERCP 06/27/2023; MRCP 06/14/2023 FINDINGS: A total of 16 intraoperative spot images are submitted for review. The images demonstrate a flexible duodenal scope in the descending duodenum with wire cannulation of the biliary system. Cholangiography demonstrates intra and extrahepatic biliary ductal dilatation with what appears to be complete occlusion of the mid and distal common bile duct. On the final images, a plastic biliary stent has been placed. IMPRESSION: ERCP with placement of plastic biliary stent as above. These images were submitted for radiologic interpretation only. Please see the procedural report for the amount of contrast and the fluoroscopy time utilized. Electronically Signed   By: Wilkie Lent M.D.   On: 07/09/2023 07:49    Labs:  CBC: Recent Labs    07/07/23 1015 07/08/23 0541 07/09/23 0604 07/23/23 1113  WBC 6.5 7.8 5.8 8.0  HGB 12.2 10.8* 10.5* 12.0  HCT 35.8* 31.9* 31.4* 36.4  PLT 264 218 211 261    COAGS: Recent Labs    06/14/23 0702 06/16/23 0609 06/27/23 0346 07/07/23 1015  INR 1.0 0.9 1.0 0.9  APTT  --   --  23*  --  BMP: Recent Labs    07/07/23 1015  07/08/23 0541 07/09/23 0604 07/23/23 1113  NA 141 135 136 145  K 3.7 3.0* 3.7 3.7  CL 107 108 108 109  CO2 26 19* 18* 29  GLUCOSE 137* 118* 147* 132*  BUN 16 16 <5* 13  CALCIUM  9.6 8.3* 8.1* 9.5  CREATININE 1.33* 0.80 0.67 1.15*  GFRNONAA 42* >60 >60 50*    LIVER FUNCTION TESTS: Recent Labs    07/07/23 1015 07/08/23 0541 07/09/23 0604 07/23/23 1113  BILITOT 8.6* 8.2* 4.8* 1.7*  AST 273* 203* 197* 78*  ALT 396* 298* 284* 165*  ALKPHOS 1,123* 848* 816* 607*  PROT 7.1 6.4* 5.5* 7.0  ALBUMIN 3.8 2.9* 2.5* 3.6    TUMOR MARKERS: No results for input(s): AFPTM, CEA, CA199, CHROMGRNA in the last 8760 hours.  Assessment and Plan: Kae Lauman is a 78 y.o. female with history of HTN, HL, DM type 2, GERD, breast cancer-right sided mastectomy, OSA, SBO, CHF, emphysema, and arthritis. Patient initially presented to the emergency room 06/13/23 with jaundice. CT AP 06/13/23 reported a vague hypodense region in the head of the pancreas with biliary and pancreatic ductal dilatation. These findings were concerning for pancreatic adenocarcinoma. A further MRI 06/14/23 reported the same mass, which measured 2.5 x 2.0 cm. MRI also reported severe pancreatic ductal dilatation distally, measuring 1.3 cm in caliber and mild pancreatic parenchymal atrophy. ERCP with stenting of the pancreatic duct was also performed 12/21.  Pet scan was performed 07/17/23, which also reported a ill-defined pancreatic head mass and associated atrophy/duct dilation throughout the pancreatic head and tail. Patient is currently followed by Dr Onita mattock. Patient was referred for a port placement to assist with treatment.   Patient reports being here for a port placement to assist with treatment. Patient has her daughter with her today. Patient denies new chest pain, shortness of breath, vomiting, diarrhea, abdominal pain, fever and/or chills.   Risks and benefits of image guided port-a-catheter placement was  discussed with the patient including, but not limited to bleeding, infection, pneumothorax, or fibrin sheath development and need for additional procedures.  All of the patient's questions were answered, patient is agreeable to proceed. Consent signed and in chart.  Thank you for this interesting consult.  I greatly enjoyed meeting Ladasha Schnackenberg [place] and look forward to participating in their care.  A copy of this report was sent to the requesting provider on this date.  Electronically Signed: Daved LOISE Hipp, PA 07/29/2023, 12:47 PM   I spent a total of  15 Minutes   in face to face in clinical consultation, greater than 50% of which was counseling/coordinating care for central venous tunneled catheter with port placement.

## 2023-07-29 NOTE — Procedures (Signed)
 Interventional Radiology Procedure Note  Procedure: Single Lumen Power Port Placement    Access:  Left IJ vein.  Findings: Catheter tip positioned at SVC/RA junction. Port is ready for immediate use.   Complications: None  EBL: < 10 mL  Recommendations:  - Ok to shower in 24 hours - Do not submerge for 7 days - Routine line care   Hartleigh Edmonston T. Fredia Sorrow, M.D Pager:  [phone]

## 2023-07-30 NOTE — Progress Notes (Signed)
 Pharmacist Chemotherapy Monitoring - Initial Assessment    Anticipated start date: 08/06/23   The following has been reviewed per standard work regarding the patient's treatment regimen: The patient's diagnosis, treatment plan and drug doses, and organ/hematologic function Lab orders and baseline tests specific to treatment regimen  The treatment plan start date, drug sequencing, and pre-medications Prior authorization status  Patient's documented medication list, including drug-drug interaction screen and prescriptions for anti-emetics and supportive care specific to the treatment regimen The drug concentrations, fluid compatibility, administration routes, and timing of the medications to be used The patient's access for treatment and lifetime cumulative dose history, if applicable  The patient's medication allergies and previous infusion related reactions, if applicable   Changes made to treatment plan:  N/A  Follow up needed:  N/A   Adrienne [name], PharmD, MBA

## 2023-07-31 ENCOUNTER — Other Ambulatory Visit: Payer: Self-pay | Admitting: *Deleted

## 2023-07-31 ENCOUNTER — Inpatient Hospital Stay: Payer: PPO | Attending: Nurse Practitioner

## 2023-07-31 ENCOUNTER — Encounter: Payer: Self-pay | Admitting: Internal Medicine

## 2023-07-31 ENCOUNTER — Other Ambulatory Visit: Payer: Self-pay

## 2023-07-31 ENCOUNTER — Ambulatory Visit (INDEPENDENT_AMBULATORY_CARE_PROVIDER_SITE_OTHER): Payer: PPO | Admitting: Internal Medicine

## 2023-07-31 VITALS — BP 176/84 | HR 60 | Ht 63.5 in | Wt 165.6 lb

## 2023-07-31 DIAGNOSIS — Z86000 Personal history of in-situ neoplasm of breast: Secondary | ICD-10-CM | POA: Insufficient documentation

## 2023-07-31 DIAGNOSIS — I10 Essential (primary) hypertension: Secondary | ICD-10-CM | POA: Diagnosis not present

## 2023-07-31 DIAGNOSIS — Z79634 Long term (current) use of topoisomerase inhibitor: Secondary | ICD-10-CM | POA: Insufficient documentation

## 2023-07-31 DIAGNOSIS — C259 Malignant neoplasm of pancreas, unspecified: Secondary | ICD-10-CM

## 2023-07-31 DIAGNOSIS — C250 Malignant neoplasm of head of pancreas: Secondary | ICD-10-CM | POA: Insufficient documentation

## 2023-07-31 DIAGNOSIS — Z794 Long term (current) use of insulin: Secondary | ICD-10-CM

## 2023-07-31 DIAGNOSIS — Z5111 Encounter for antineoplastic chemotherapy: Secondary | ICD-10-CM | POA: Insufficient documentation

## 2023-07-31 DIAGNOSIS — E1169 Type 2 diabetes mellitus with other specified complication: Secondary | ICD-10-CM | POA: Diagnosis not present

## 2023-07-31 DIAGNOSIS — Z79631 Long term (current) use of antimetabolite agent: Secondary | ICD-10-CM | POA: Insufficient documentation

## 2023-07-31 DIAGNOSIS — Z9011 Acquired absence of right breast and nipple: Secondary | ICD-10-CM | POA: Insufficient documentation

## 2023-07-31 DIAGNOSIS — Z79630 Long term (current) use of alkylating agent: Secondary | ICD-10-CM | POA: Insufficient documentation

## 2023-07-31 DIAGNOSIS — Z5189 Encounter for other specified aftercare: Secondary | ICD-10-CM | POA: Insufficient documentation

## 2023-07-31 LAB — CYTOLOGY - NON PAP

## 2023-07-31 MED ORDER — NIFEDIPINE ER OSMOTIC RELEASE 60 MG PO TB24: 60.0000 mg | ORAL_TABLET | Freq: Every day | ORAL | 0 refills | Status: DC

## 2023-07-31 NOTE — Assessment & Plan Note (Signed)
 Recently diagnosed Followed by GI and oncology -planned to start chemotherapy

## 2023-07-31 NOTE — Progress Notes (Addendum)
 Established Patient Office Visit  Subjective:  Patient ID: Adrienne [name], female    DOB: 25-Mar-1946  Age: 77 y.o. MRN: [id number]  CC:  Chief Complaint  Patient presents with   Hypertension    Has been having elevated bp readings. Was taken off medications from the hospital.     HPI Adrienne [name] is a 78 y.o. female with past medical history of HTN, type 2 DM, nonishcemic CM, hypothyroidism, OA of hip and knee, HLD, breast ca. s/p right mastectomy who presents for f/u of recent hospitalization.  She was recently admitted at [facility] for abdominal discomfort and dark urine. CT abdomen pelvis showed hypodense region in head of pancreas with ductal dilation concerning for malignancy.  She had MRCP, which was highly concerning for pancreatic adenocarcinoma.  She had ERCP on 06/14/23, with placement of a plastic pancreatic duct stent.  She had recurrence of hyperbilirubinemia, was advised to be admitted again, had ERCP and EUS on 07/08/23.  Biopsy showed adenocarcinoma.  She has been evaluated by oncology and is planned to start chemotherapy.  She denies any nausea or vomiting currently.  Denies any diarrhea, melena or hematochezia.  HTN: BP is elevated today, but her amlodipine  was recently discontinued during hospitalization.  Takes Losartan  100 mg QD regularly. Coreg  was discontinued due to bradycardia. Patient denies headache, dizziness, chest pain, dyspnea or palpitations.  Hypothyroidism: She takes NP thyroid  90 mg daily currently.  She has history of oversupplemented thyroid  in the past, but recent TSH has been better.  She denies any recent change in appetite, weight, constipation, tremors or palpitations.  Type 2 DM: Her HbA1c was 8.1 in 12/24.  She has been placed on Basaglar  to 12 units nightly and ISS.  Her blood glucose have been better controlled, around 80-140 now.  She has not needed mealtime insulin  frequently.  Denies any polyuria or polydipsia  currently.  Nonischemic CM: She has been evaluated by cardiology.  She recently had echocardiogram, which showed MR with mild MVP.   HLD: She has hot intolerance to statins.  Her LDL was 145 in 04/24.  She had Pharm.D. visit for PCSK9 inhibitor therapy.  She was approved for Leqvio  and has started it now.      Past Medical History:  Diagnosis Date   Allergy    Ambulates with cane    straight cane   Arthritis    knee, back   CHF (congestive heart failure) (HCC)    Ductal carcinoma in situ of breast 12/2018   R Breast-mastectomy only   Gout    History of blood transfusion 1986   w/ Hysterectomy surgery   Hyperlipidemia    diet controlled - no meds   Hypertension    Hypothyroidism    Pelvic kidney    lower right pelvic kidney    SBO (small bowel obstruction) (HCC) 10/2016   surgery    Sleep apnea    Mild - no mask needed per sleep study   Smoker    quit smoking 2018   Type 2 diabetes mellitus (HCC)     Past Surgical History:  Procedure Laterality Date   ABDOMINAL HYSTERECTOMY  1986   COMPLETE-precancerous   APPENDECTOMY     pt states it was removed when gallbladder was removed.   AUGMENTATION MAMMAPLASTY     BACK SURGERY     lower back   BILIARY BRUSHING  07/08/2023   Procedure: BILIARY BRUSHING;  Surgeon: Wilhelmenia Aloha Raddle., MD;  Location: [facility];  Service: Gastroenterology;;   BILIARY STENT PLACEMENT N/A 07/08/2023   Procedure: BILIARY STENT PLACEMENT;  Surgeon: Wilhelmenia Aloha Raddle., MD;  Location: [facility];  Service: Gastroenterology;  Laterality: N/A;   BIOPSY  06/14/2023   Procedure: BIOPSY;  Surgeon: Avram Lupita BRAVO, MD;  Location: [facility];  Service: Gastroenterology;;   BIOPSY  07/08/2023   Procedure: BIOPSY;  Surgeon: Wilhelmenia Aloha Raddle., MD;  Location: [facility];  Service: Gastroenterology;;   BREAST BIOPSY Right 05/12/2019   times 2   BREAST LUMPECTOMY WITH RADIOACTIVE SEED LOCALIZATION Right 03/18/2019   Procedure: RIGHT  BREAST LUMPECTOMY WITH RADIOACTIVE SEED LOCALIZATION;  Surgeon: [name], MD;  Location: [facility];  Service: General;  Laterality: Right;   CHOLECYSTECTOMY     COLONOSCOPY  02/15/2008   Kaplan    COLONOSCOPY WITH PROPOFOL   02/27/2018   Dr.Nandigam   ECTOPIC PREGNANCY SURGERY     ERCP N/A 06/14/2023   Procedure: ENDOSCOPIC RETROGRADE CHOLANGIOPANCREATOGRAPHY (ERCP);  Surgeon: Avram Lupita BRAVO, MD;  Location: [facility];  Service: Gastroenterology;  Laterality: N/A;   ERCP N/A 06/27/2023   Procedure: ENDOSCOPIC RETROGRADE CHOLANGIOPANCREATOGRAPHY (ERCP);  Surgeon: Charlanne Groom, MD;  Location: [facility];  Service: Gastroenterology;  Laterality: N/A;   ERCP N/A 07/08/2023   Procedure: ENDOSCOPIC RETROGRADE CHOLANGIOPANCREATOGRAPHY (ERCP);  Surgeon: Wilhelmenia Aloha Raddle., MD;  Location: [facility];  Service: Gastroenterology;  Laterality: N/A;   ESOPHAGOGASTRODUODENOSCOPY N/A 07/08/2023   Procedure: ESOPHAGOGASTRODUODENOSCOPY (EGD);  Surgeon: Wilhelmenia Aloha Raddle., MD;  Location: [facility];  Service: Gastroenterology;  Laterality: N/A;   ESOPHAGOGASTRODUODENOSCOPY (EGD) WITH PROPOFOL  N/A 06/14/2023   Procedure: ESOPHAGOGASTRODUODENOSCOPY (EGD) WITH PROPOFOL ;  Surgeon: Avram Lupita BRAVO, MD;  Location: [facility];  Service: Gastroenterology;  Laterality: N/A;   EUS N/A 07/08/2023   Procedure: UPPER ENDOSCOPIC ULTRASOUND (EUS) LINEAR;  Surgeon: Wilhelmenia Aloha Raddle., MD;  Location: [facility];  Service: Gastroenterology;  Laterality: N/A;   FINE NEEDLE ASPIRATION N/A 07/08/2023   Procedure: FINE NEEDLE ASPIRATION (FNA) LINEAR;  Surgeon: Wilhelmenia Aloha Raddle., MD;  Location: [facility];  Service: Gastroenterology;  Laterality: N/A;   IR IMAGING GUIDED PORT INSERTION  07/29/2023   JOINT REPLACEMENT     Left hip total Dr. Melodi 10-01-17   MALONEY DILATION  06/14/2023   Procedure: AGAPITO DILATION;  Surgeon: Avram Lupita BRAVO, MD;  Location: [facility];  Service: Gastroenterology;;    MASTECTOMY Right 07/26/2019   PANCREATIC STENT PLACEMENT  06/14/2023   Procedure: PANCREATIC STENT PLACEMENT;  Surgeon: Avram Lupita BRAVO, MD;  Location: [facility];  Service: Gastroenterology;;   PANCREATIC STENT PLACEMENT  06/27/2023   Procedure: PANCREATIC STENT PLACEMENT;  Surgeon: Charlanne Groom, MD;  Location: [facility];  Service: Gastroenterology;;   POLYPECTOMY     polypectomy-oropharynx     RE-EXCISION OF BREAST CANCER,SUPERIOR MARGINS Right 04/08/2019   Procedure: RE-EXCISION OF RIGHT BREAST ANTERIOR MARGINS;  Surgeon: Curvin Deward III, MD;  Location: [facility];  Service: General;  Laterality: Right;   right knee meniscus     SBO  10/2016   small bowel obstruction   SIMPLE MASTECTOMY WITH AXILLARY SENTINEL NODE BIOPSY Right 07/26/2019   Procedure: RIGHT MASTECTOMY WITH SENTINEL NODE BIOPSY;  Surgeon: [name], MD;  Location: [facility];  Service: General;  Laterality: Right;   SPHINCTEROTOMY  06/27/2023   Procedure: SPHINCTEROTOMY;  Surgeon: Charlanne Groom, MD;  Location: [facility];  Service: Gastroenterology;;   ANNETT  07/08/2023   Procedure: ANNETT;  Surgeon: Wilhelmenia Aloha Raddle., MD;  Location: [facility];  Service:  Gastroenterology;;   TOTAL HIP ARTHROPLASTY Left 10/01/2017   Procedure: LEFT  TOTAL HIP ARTHROPLASTY ANTERIOR APPROACH;  Surgeon: Melodi Lerner, MD;  Location: [facility];  Service: Orthopedics;  Laterality: Left;   TOTAL HIP ARTHROPLASTY Right 03/11/2018   Procedure: RIGHT TOTAL HIP ARTHROPLASTY ANTERIOR APPROACH;  Surgeon: Melodi Lerner, MD;  Location: [facility];  Service: Orthopedics;  Laterality: Right;    UPPER GASTROINTESTINAL ENDOSCOPY      Family History  Problem Relation Age of Onset   Cancer Sister    Diabetes Brother    Diabetes Brother    Hypertension Other    Breast cancer Sister    Colon cancer Neg Hx    Colon polyps Neg Hx    Rectal cancer Neg Hx    Stomach cancer Neg Hx     Social History    Socioeconomic History   Marital status: Widowed    Spouse name: Not on file   Number of children: 3   Years of education: 23   Highest education level: Some college, no degree  Occupational History   Occupation: retired  Tobacco Use   Smoking status: Former    Current packs/day: 0.00    Average packs/day: 1 pack/day for 32.0 years (32.0 ttl pk-yrs)    Types: E-cigarettes, Cigarettes    Start date: 11/20/1984    Quit date: 11/20/2016    Years since quitting: 6.6   Smokeless tobacco: Never   Tobacco comments:    Smoker since 1975 has quit and started back several times    As of 07/03/23: smoked 30 years, 1 PPD, quit 2018  Vaping Use   Vaping status: Former  Substance and Sexual Activity   Alcohol use: No   Drug use: No   Sexual activity: Not Currently    Birth control/protection: Surgical    Comment: Hysterectomy  Other Topics Concern   Not on file  Social History Narrative   Widow since 2006.Married previously for 25 years.Lives with grandson and 2 great grandchildren.Retired.Previously office work.   Social Drivers of Corporate Investment Banker Strain: Low Risk  (06/27/2021)   Overall Financial Resource Strain (CARDIA)    Difficulty of Paying Living Expenses: Not hard at all  Food Insecurity: No Food Insecurity (07/10/2023)   Hunger Vital Sign    Worried About Running Out of Food in the Last Year: Never true    Ran Out of Food in the Last Year: Never true  Transportation Needs: No Transportation Needs (07/10/2023)   PRAPARE - Administrator, Civil Service (Medical): No    Lack of Transportation (Non-Medical): No  Physical Activity: Inactive (06/27/2021)   Exercise Vital Sign    Days of Exercise per Week: 0 days    Minutes of Exercise per Session: 0 min  Stress: No Stress Concern Present (06/27/2021)   [employer] of Occupational Health - Occupational Stress Questionnaire    Feeling of Stress : Not at all  Social Connections: Moderately Integrated  (07/07/2023)   Social Connection and Isolation Panel [NHANES]    Frequency of Communication with Friends and Family: More than three times a week    Frequency of Social Gatherings with Friends and Family: Once a week    Attends Religious Services: More than 4 times per year    Active Member of [employer] or Organizations: Yes    Attends Banker Meetings: More than 4 times per year    Marital Status: Widowed  Intimate Partner Violence: Not At Risk (  07/10/2023)   Humiliation, Afraid, Rape, and Kick questionnaire    Fear of Current or Ex-Partner: No    Emotionally Abused: No    Physically Abused: No    Sexually Abused: No    Outpatient Medications Prior to Visit  Medication Sig Dispense Refill   Cholecalciferol (VITAMIN D -3) 125 MCG (5000 UT) TABS Take 1 tablet by mouth daily.     famotidine  (PEPCID ) 20 MG tablet Take 1 tablet (20 mg total) by mouth 2 (two) times daily. (Patient taking differently: Take 20 mg by mouth daily.) 30 tablet 1   Glucose Blood (BLOOD GLUCOSE TEST STRIPS) STRP 1 each by In Vitro route in the morning, at noon, and at bedtime. May substitute to any manufacturer covered by patient's insurance. 100 strip 5   insulin  aspart (NOVOLOG ) 100 UNIT/ML FlexPen Inject 0-6 Units into the skin 3 (three) times daily with meals. Check Blood Glucose (BG) and inject per scale: BG <150= 0 unit; BG 150-200= 1 unit; BG 201-250= 2 unit; BG 251-300= 3 unit; BG 301-350= 4 unit; BG 351-400= 5 unit; BG >400= 6 unit and Call Primary Care. 15 mL 0   Insulin  Glargine (BASAGLAR  KWIKPEN) 100 UNIT/ML Inject 12 Units into the skin daily. May substitute as needed per insurance. (Patient taking differently: Inject 12 Units into the skin at bedtime.) 15 mL 2   Lancets (ONETOUCH DELICA PLUS LANCET33G) MISC USE TO CHECK BLOOD SUGAR IN THE MORNING, AT NOON, AND AT BEDTIME. 100 each 0   Lancets Misc. MISC 1 each by Does not apply route in the morning, at noon, and at bedtime. May substitute to any  manufacturer covered by patient's insurance. 100 each 5   lidocaine -prilocaine  (EMLA ) cream Apply 1 Application topically as needed. 30 g 2   losartan  (COZAAR ) 100 MG tablet Take 1 tablet by mouth once daily 90 tablet 0   ondansetron  (ZOFRAN ) 8 MG tablet Take 1 tablet (8 mg total) by mouth every 8 (eight) hours as needed for nausea or vomiting. 20 tablet 1   prochlorperazine  (COMPAZINE ) 10 MG tablet Take 1 tablet (10 mg total) by mouth every 6 (six) hours as needed for nausea or vomiting. 30 tablet 2   thyroid  (NP THYROID ) 90 MG tablet Take 1 tablet (90 mg total) by mouth daily. 30 tablet 5   cetirizine  (ZYRTEC ) 10 MG tablet Take 1 tablet (10 mg total) by mouth daily. 30 tablet 11   fluticasone  (FLONASE ) 50 MCG/ACT nasal spray Place 2 sprays into both nostrils daily. (Patient not taking: Reported on 07/31/2023) 16 g 6   celecoxib  (CELEBREX ) 100 MG capsule Take 100 mg by mouth 2 (two) times daily. (Patient not taking: Reported on 07/07/2023)     No facility-administered medications prior to visit.    Allergies  Allergen Reactions   Lisinopril  Swelling    Angioedema   Statins     Myalgias   Atorvastatin  Other (See Comments)    Muscle aches     ROS Review of Systems  Constitutional:  Negative for chills and fever.  HENT:  Positive for tinnitus. Negative for congestion, sinus pressure, sinus pain and sore throat.   Eyes:  Positive for visual disturbance. Negative for pain and discharge.  Respiratory:  Negative for cough and shortness of breath.   Cardiovascular:  Negative for chest pain and palpitations.  Gastrointestinal:  Negative for abdominal pain, diarrhea, nausea and vomiting.  Endocrine: Negative for polydipsia and polyuria.  Genitourinary:  Negative for dysuria and hematuria.  Musculoskeletal:  Positive for  arthralgias. Negative for neck pain and neck stiffness.  Skin:  Negative for rash.  Neurological:  Negative for dizziness and weakness.  Psychiatric/Behavioral:  Negative for  agitation and behavioral problems.       Objective:    Physical Exam Vitals reviewed.  Constitutional:      General: She is not in acute distress.    Appearance: She is obese. She is not diaphoretic.  HENT:     Head: Normocephalic and atraumatic.     Nose: Nose normal.     Mouth/Throat:     Mouth: Mucous membranes are moist.  Eyes:     General: No scleral icterus.    Extraocular Movements: Extraocular movements intact.  Cardiovascular:     Rate and Rhythm: Normal rate and regular rhythm.     Pulses: Normal pulses.     Heart sounds: Normal heart sounds. No murmur heard. Pulmonary:     Breath sounds: Normal breath sounds. No wheezing or rales.  Musculoskeletal:     Cervical back: Neck supple. No tenderness.     Right lower leg: No edema.     Left lower leg: No edema.  Skin:    General: Skin is warm.  Neurological:     General: No focal deficit present.     Mental Status: She is alert and oriented to person, place, and time.     Sensory: No sensory deficit.     Motor: No weakness.  Psychiatric:        Mood and Affect: Mood normal.        Behavior: Behavior normal.     BP (!) 176/84 (BP Location: Left Arm)   Pulse 60   Ht 5' 3.5 (1.613 m)   Wt 165 lb 9.6 oz (75.1 kg)   SpO2 97%   BMI 28.87 kg/m  Wt Readings from Last 3 Encounters:  07/31/23 165 lb 9.6 oz (75.1 kg)  07/29/23 165 lb 12.6 oz (75.2 kg)  07/24/23 165 lb 12.8 oz (75.2 kg)    Lab Results  Component Value Date   TSH 3.783 06/27/2023   Lab Results  Component Value Date   WBC 8.0 07/23/2023   HGB 12.0 07/23/2023   HCT 36.4 07/23/2023   MCV 93.1 07/23/2023   PLT 261 07/23/2023   Lab Results  Component Value Date   NA 145 07/23/2023   K 3.7 07/23/2023   CO2 29 07/23/2023   GLUCOSE 132 (H) 07/23/2023   BUN 13 07/23/2023   CREATININE 1.15 (H) 07/23/2023   BILITOT 1.7 (H) 07/23/2023   ALKPHOS 607 (H) 07/23/2023   AST 78 (H) 07/23/2023   ALT 165 (H) 07/23/2023   PROT 7.0 07/23/2023    ALBUMIN 3.6 07/23/2023   CALCIUM  9.5 07/23/2023   ANIONGAP 7 07/23/2023   EGFR 42 (L) 06/24/2023   GFR 74.79 07/15/2013   Lab Results  Component Value Date   CHOL 219 (H) 10/10/2022   Lab Results  Component Value Date   HDL 53 10/10/2022   Lab Results  Component Value Date   LDLCALC 145 (H) 10/10/2022   Lab Results  Component Value Date   TRIG 118 10/10/2022   Lab Results  Component Value Date   CHOLHDL 4.1 10/10/2022   Lab Results  Component Value Date   HGBA1C 8.1 (H) 06/24/2023      Assessment & Plan:   Problem List Items Addressed This Visit       Cardiovascular and Mediastinum   Hypertension - Primary   BP  Readings from Last 1 Encounters:  07/31/23 (!) 176/84   Uncontrolled with Losartan  100 mg QD Recently discontinued amlodipine  10 mg daily due to concern for hypotension during recent hospitalization Started nifedipine  60 mg once daily, her BP has been around 170s/100s at home Counseled for compliance with the medications Advised DASH diet and moderate exercise/walking, at least 150 mins/week      Relevant Medications   NIFEdipine  (PROCARDIA  XL/NIFEDICAL XL) 60 MG 24 hr tablet     Digestive   Pancreatic adenocarcinoma (HCC)   Recently diagnosed Followed by GI and oncology -planned to start chemotherapy        Endocrine   Type 2 diabetes mellitus with other specified complication (HCC)   Lab Results  Component Value Date   HGBA1C 8.1 (H) 06/24/2023   Uncontrolled, but improving On Basaglar  12 units nightly and NovoLog  ISS Associated with HTN and HLD Metformin  discontinued from recent hospitalization Avoid sulfonylureas or incretin analogs due to pancreatic mass Advised to follow diabetic diet On ARB F/u CMP and lipid panel Diabetic eye exam: Advised to follow up with Ophthalmology for diabetic eye exam         Meds ordered this encounter  Medications   NIFEdipine  (PROCARDIA  XL/NIFEDICAL XL) 60 MG 24 hr tablet    Sig: Take 1  tablet (60 mg total) by mouth daily.    Dispense:  30 tablet    Refill:  0    Follow-up: Return if symptoms worsen or fail to improve.    Suzzane MARLA Blanch, MD

## 2023-07-31 NOTE — Assessment & Plan Note (Addendum)
 BP Readings from Last 1 Encounters:  07/31/23 (!) 176/84   Uncontrolled with Losartan  100 mg QD Recently discontinued amlodipine  10 mg daily due to concern for hypotension during recent hospitalization Started nifedipine  60 mg once daily, her BP has been around 170s/100s at home Counseled for compliance with the medications Advised DASH diet and moderate exercise/walking, at least 150 mins/week

## 2023-07-31 NOTE — Progress Notes (Signed)
 The proposed treatment discussed in conference is for discussion purpose only and is not a binding recommendation.  The patients have not been physically examined, or presented with their treatment options.  Therefore, final treatment plans cannot be decided.

## 2023-07-31 NOTE — Assessment & Plan Note (Addendum)
 Lab Results  Component Value Date   HGBA1C 8.1 (H) 06/24/2023   Uncontrolled, but improving On Basaglar  12 units nightly and NovoLog  ISS Associated with HTN and HLD Metformin  discontinued from recent hospitalization Avoid sulfonylureas or incretin analogs due to pancreatic mass Advised to follow diabetic diet On ARB F/u CMP and lipid panel Diabetic eye exam: Advised to follow up with Ophthalmology for diabetic eye exam

## 2023-07-31 NOTE — Patient Instructions (Signed)
 Please start taking Nifedipine  as prescribed.  Please continue taking Losartan  as prescribed.  Please continue to follow low carb diet and ambulate as tolerated.

## 2023-08-01 ENCOUNTER — Encounter: Payer: Self-pay | Admitting: Hematology

## 2023-08-04 NOTE — Assessment & Plan Note (Signed)
 cT2N0M0, stage IB -Patient presented with obstructive jaundice, CT and MRI showed a 2.5 cm mass in the head of the pancreas, with pancreatic duct and biliary duct dilatation.  ERCP was attempted twice but failed due to deformed duodenum -She underwent ERCP and stent placement, EUS with fine-needle biopsy of the pancreatic mass by Dr. Brice Campi on July 08, 2023.  Biopsy confirmed adenocarcinoma. -Due to the tumor invasion to portal vein and SMV, I recommended neoadjuvant chemotherapy FOLFIRINOX, she wills tart on 08/05/2023

## 2023-08-05 ENCOUNTER — Other Ambulatory Visit: Payer: Self-pay | Admitting: *Deleted

## 2023-08-05 ENCOUNTER — Telehealth: Payer: Self-pay | Admitting: Genetic Counselor

## 2023-08-05 ENCOUNTER — Inpatient Hospital Stay: Payer: PPO | Admitting: Hematology

## 2023-08-05 ENCOUNTER — Inpatient Hospital Stay: Payer: PPO

## 2023-08-05 VITALS — BP 171/82 | HR 67 | Temp 97.3°F | Resp 20 | Wt 164.5 lb

## 2023-08-05 DIAGNOSIS — C259 Malignant neoplasm of pancreas, unspecified: Secondary | ICD-10-CM | POA: Diagnosis not present

## 2023-08-05 DIAGNOSIS — D0511 Intraductal carcinoma in situ of right breast: Secondary | ICD-10-CM | POA: Diagnosis not present

## 2023-08-05 DIAGNOSIS — Z79630 Long term (current) use of alkylating agent: Secondary | ICD-10-CM | POA: Diagnosis not present

## 2023-08-05 DIAGNOSIS — Z79634 Long term (current) use of topoisomerase inhibitor: Secondary | ICD-10-CM | POA: Diagnosis not present

## 2023-08-05 DIAGNOSIS — Z9011 Acquired absence of right breast and nipple: Secondary | ICD-10-CM | POA: Diagnosis not present

## 2023-08-05 DIAGNOSIS — K8689 Other specified diseases of pancreas: Secondary | ICD-10-CM

## 2023-08-05 DIAGNOSIS — Z86000 Personal history of in-situ neoplasm of breast: Secondary | ICD-10-CM | POA: Diagnosis not present

## 2023-08-05 DIAGNOSIS — C250 Malignant neoplasm of head of pancreas: Secondary | ICD-10-CM | POA: Diagnosis not present

## 2023-08-05 DIAGNOSIS — Z5111 Encounter for antineoplastic chemotherapy: Secondary | ICD-10-CM | POA: Diagnosis not present

## 2023-08-05 DIAGNOSIS — Z95828 Presence of other vascular implants and grafts: Secondary | ICD-10-CM | POA: Insufficient documentation

## 2023-08-05 DIAGNOSIS — Z79631 Long term (current) use of antimetabolite agent: Secondary | ICD-10-CM | POA: Diagnosis not present

## 2023-08-05 DIAGNOSIS — Z803 Family history of malignant neoplasm of breast: Secondary | ICD-10-CM | POA: Diagnosis not present

## 2023-08-05 DIAGNOSIS — Z5189 Encounter for other specified aftercare: Secondary | ICD-10-CM | POA: Diagnosis not present

## 2023-08-05 LAB — CBC WITH DIFFERENTIAL/PLATELET
Abs Immature Granulocytes: 0.01 10*3/uL (ref 0.00–0.07)
Basophils Absolute: 0 10*3/uL (ref 0.0–0.1)
Basophils Relative: 0 %
Eosinophils Absolute: 0.1 10*3/uL (ref 0.0–0.5)
Eosinophils Relative: 1 %
HCT: 34 % — ABNORMAL LOW (ref 36.0–46.0)
Hemoglobin: 11.3 g/dL — ABNORMAL LOW (ref 12.0–15.0)
Immature Granulocytes: 0 %
Lymphocytes Relative: 32 %
Lymphs Abs: 2.5 10*3/uL (ref 0.7–4.0)
MCH: 30.4 pg (ref 26.0–34.0)
MCHC: 33.2 g/dL (ref 30.0–36.0)
MCV: 91.4 fL (ref 80.0–100.0)
Monocytes Absolute: 0.5 10*3/uL (ref 0.1–1.0)
Monocytes Relative: 6 %
Neutro Abs: 4.6 10*3/uL (ref 1.7–7.7)
Neutrophils Relative %: 61 %
Platelets: 227 10*3/uL (ref 150–400)
RBC: 3.72 MIL/uL — ABNORMAL LOW (ref 3.87–5.11)
RDW: 13.2 % (ref 11.5–15.5)
WBC: 7.7 10*3/uL (ref 4.0–10.5)
nRBC: 0 % (ref 0.0–0.2)

## 2023-08-05 LAB — COMPREHENSIVE METABOLIC PANEL
ALT: 45 U/L — ABNORMAL HIGH (ref 0–44)
AST: 34 U/L (ref 15–41)
Albumin: 3.7 g/dL (ref 3.5–5.0)
Alkaline Phosphatase: 339 U/L — ABNORMAL HIGH (ref 38–126)
Anion gap: 6 (ref 5–15)
BUN: 16 mg/dL (ref 8–23)
CO2: 28 mmol/L (ref 22–32)
Calcium: 9.3 mg/dL (ref 8.9–10.3)
Chloride: 105 mmol/L (ref 98–111)
Creatinine, Ser: 0.87 mg/dL (ref 0.44–1.00)
GFR, Estimated: >60 mL/min (ref >60)
Glucose, Bld: 280 mg/dL — ABNORMAL HIGH (ref 70–99)
Potassium: 3.5 mmol/L (ref 3.5–5.1)
Sodium: 139 mmol/L (ref 135–145)
Total Bilirubin: 1.1 mg/dL (ref 0.0–1.2)
Total Protein: 6.9 g/dL (ref 6.5–8.1)

## 2023-08-05 LAB — GENETIC SCREENING ORDER

## 2023-08-05 MED ORDER — SODIUM CHLORIDE 0.9% FLUSH
10.0000 mL | Freq: Once | INTRAVENOUS | Status: AC
  Administered 2023-08-05: 10 mL

## 2023-08-05 MED ORDER — HEPARIN SOD (PORK) LOCK FLUSH 100 UNIT/ML IV SOLN
500.0000 [IU] | Freq: Once | INTRAVENOUS | Status: AC
  Administered 2023-08-05: 500 [IU]

## 2023-08-05 MED FILL — Fosaprepitant Dimeglumine For IV Infusion 150 MG (Base Eq): INTRAVENOUS | Qty: 5 | Status: AC

## 2023-08-05 NOTE — Progress Notes (Signed)
[facility]   [phone] Fax:([phone]) [phone]   Clinic Follow up Note   Patient Care Team: Anabel Halon, MD as PCP - General (Internal Medicine) Rollene Rotunda, MD as PCP - Cardiology (Cardiology) Donnelly Angelica, RN as Oncology Nurse Navigator Pershing Proud, RN as Oncology Nurse Navigator Griselda Miner, MD as Consulting Physician (General Surgery) Malachy Mood, MD as Consulting Physician (Hematology) Lonie Peak, MD as Attending Physician (Radiation Oncology) Erroll Luna, [facility] (Inactive) as Pharmacist (Pharmacist)  Date of Service:  08/05/2023  CHIEF COMPLAINT: f/u of pancreatic cancer  CURRENT THERAPY:  Neoadjuvant chemotherapy FOLFIRINOX  Oncology History   Pancreatic adenocarcinoma (HCC) cT2N0M0, stage IB -Patient presented with obstructive jaundice, CT and MRI showed a 2.5 cm mass in the head of the pancreas, with pancreatic duct and biliary duct dilatation.  ERCP was attempted twice but failed due to deformed duodenum -She underwent ERCP and stent placement, EUS with fine-needle biopsy of the pancreatic mass by Dr. Meridee Score on July 08, 2023.  Biopsy confirmed adenocarcinoma. -Due to the tumor invasion to portal vein and SMV, I recommended neoadjuvant chemotherapy FOLFIRINOX, she wills tart on 08/05/2023  Assessment and Plan    Pancreatic Cancer 78 year old female with pancreatic cancer, preparing for FOLFIRINOX chemotherapy (oxaliplatin, irinotecan, 5-fluorouracil). Port placed without complications. Informed consent obtained, discussing side effects: cold sensitivity (oxaliplatin), diarrhea (irinotecan), fatigue (5-FU). Labs show good blood counts, bilirubin decreased to 1.7. Asymptomatic with no stomach pain or jaundice. Emphasized importance of attending scheduled treatments despite weather concerns to avoid delays. - Administer FOLFIRINOX chemotherapy starting tomorrow - Monitor for side effects: cold sensitivity, diarrhea,  fatigue - Instruct to avoid cold foods/drinks - Advise use of Imodium AD for diarrhea - Administer growth factor shot on day 3 if approved - Instruct to take Claritin for five days starting on day 3 if growth factor shot is given - Schedule follow-up every two weeks for labs and assessment before each treatment - Reiterate importance of attending scheduled treatments despite weather concerns  General Health Maintenance Emphasized maintaining general health and activity during chemotherapy. Discussed monitoring for infection due to potential leukopenia from chemotherapy. - Encourage staying active and maintaining routine activities - Monitor for signs of infection and report symptoms immediately  Plan -Lab reviewed, adequate for treatment, she is scheduled to start for cycle chemotherapy FOLFIRINOX tomorrow -Will give G-CSF on day 3 if approved by insurance -Lab and follow-up in 2 weeks before cycle 2 chemo        SUMMARY OF ONCOLOGIC HISTORY: Oncology History  Ductal carcinoma in situ (DCIS) of right breast  01/28/2019 Mammogram   Diagnostic Mammogram 01/28/19  IMPRESSION: Right breast retroareolar 7 mm grouped pleomorphic calcifications are suspicious.   02/02/2019 Initial Biopsy   Diagnosis 02/02/19 Breast, right, needle core biopsy, outer retroareolar - DUCTAL CARCINOMA IN SITU WITH CALCIFICATIONS. Microscopic Comment The ductal carcinoma in situ is intermediate grade. Estrogen and progesterone receptors will be performed.  Results: IMMUNOHISTOCHEMICAL AND MORPHOMETRIC ANALYSIS PERFORMED MANUALLY Estrogen Receptor: 90%, POSITIVE, STRONG STAINING INTENSITY Progesterone Receptor: 20%, POSITIVE, STRONG STAINING INTENSITY    02/02/2019 Cancer Staging   Staging form: Breast, AJCC 8th Edition - Clinical stage from 02/02/2019: Stage 0 (cTis (DCIS), cN0, cM0, G2, ER+, PR+, HER2: Not Assessed) - Signed by Malachy Mood, MD on 02/10/2019   02/04/2019 Initial Diagnosis   Ductal carcinoma  in situ (DCIS) of right breast   03/18/2019 Surgery   RIGHT BREAST LUMPECTOMY WITH RADIOACTIVE SEED LOCALIZATION by Dr. Carolynne Edouard 03/18/19  03/18/2019 Pathology Results   FINAL MICROSCOPIC DIAGNOSIS: 03/18/19 A. BREAST, RIGHT, LUMPECTOMY:  - Ductal carcinoma in situ with calcifications and necrosis, 1.4 cm.  - Ductal carcinoma in situ focally 0.1 cm from medial and anterior  lumpectomy margins.  - Ductal carcinoma in situ involves the final anterior margin (See part  D).  - Ductal carcinoma in situ focally 0.3 cm from superior lumpectomy  margin.  - Biopsy site and biopsy clip.   B. BREAST, RIGHT LATERAL MARGIN, EXCISION:  - Benign breast tissue.  - No ductal carcinoma in situ.  - Final lateral margin greater than 1 cm.   C. BREAST, RIGHT MEDIAL MARGIN, EXCISION:  - Ductal carcinoma in situ, 0.3 cm.  - Ductal carcinoma in situ with focally 0.3 cm from final medial margin.   D. BREAST, RIGHT ANTERIOR MARGIN, EXCISION:  - Ductal carcinoma in situ, 1.5 cm.  - Ductal carcinoma in situ focally involves final anterior margin.   E. BREAST, RIGHT DEEP MARGIN, EXCISION:  - Fibrocystic changes.  - No ductal carcinoma in situ.  - Final posterior margin greater than 0.7 cm.    04/08/2019 Surgery   RE-EXCISION OF RIGHT BREAST ANTERIOR MARGINS by Dr Carolynne Edouard 04/08/19   04/08/2019 Pathology Results    DIAGNOSIS: 04/08/19  A. BREAST, RIGHT, ANTERIOR SUPERIOR, EXCISION:  - Intermediate grade ductal carcinoma in situ with necrosis.  - In situ carcinoma is <1 mm (not on ink) from the new anterior superior  margin, multifocally.  - Intraductal papilloma.  - Fibrocystic change.   B. BREAST, RIGHT, ANTERIOR INFERIOR, EXCISION:  - Benign breast tissue with resection changes.   C. BREAST, RIGHT, MEDIAL, EXCISION:  - Benign breast tissue with resection changes.  - Fibroadenomatoid and fibrocystic changes.   05/06/2019 Mammogram   Right Mammogram 05/06/19 IMPRESSION: Suspicious finding with 2  groups of microcalcifications in the medial right breast. The anterior group measures 2 x 2 x 2 mm. The more posterior group measures 4 x 3 x 4 mm.   05/12/2019 Pathology Results   Diagnosis 05/12/19 1. Breast, right, needle core biopsy, medial - DUCTAL CARCINOMA IN SITU WITH CALCIFICATIONS. - SEE MICROSCOPIC DESCRIPTION. 2. Breast, right, needle core biopsy, medial - DUCTAL CARCINOMA IN SITU WITH CALCIFICATIONS. - SEE MICROSCOPIC DESCRIPTION. Results: IMMUNOHISTOCHEMICAL AND MORPHOMETRIC ANALYSIS PERFORMED MANUALLY Estrogen Receptor: 100%, POSITIVE, STRONG STAINING INTENSITY Progesterone Receptor: 0%, NEGATIVE COMMENT: The negative hormone receptor study(ies) in this case has an internal positive control. Results: IMMUNOHISTOCHEMICAL AND MORPHOMETRIC ANALYSIS PERFORMED MANUALLY Estrogen Receptor: 100%, POSITIVE, STRONG STAINING INTENSITY Progesterone Receptor: 0%, NEGATIVE COMMENT: The negative hormone receptor study(ies) in this case has an internal positive control.   07/26/2019 Surgery   RIGHT MASTECTOMY WITH SENTINEL NODE BIOPSY by Dr. Carolynne Edouard 07/26/19   07/26/2019 Pathology Results   FINAL MICROSCOPIC DIAGNOSIS: 07/26/19  A. LYMPH NODE, RIGHT #1, SENTINEL,  BIOPSY:  -  No carcinoma identified in one lymph node (0/1)   B. BREAST, RIGHT, MASTECTOMY:  -  Ductal carcinoma in situ, intermediate grade, 0.6 cm  -  Margins uninvolved by carcinoma (greater than 1 cm; posterior margin)  -  Previous biopsy site changes (x2)  -  See oncology table below   07/26/2019 Cancer Staging   Staging form: Breast, AJCC 8th Edition - Pathologic stage from 07/26/2019: Stage 0 (pTis (DCIS), pN0, cM0, G2, ER+, PR+, HER2: Not Assessed) - Signed by Malachy Mood, MD on 08/10/2019   Pancreatic adenocarcinoma (HCC)  07/07/2022 Cancer Staging   Staging form: Exocrine Pancreas, AJCC 8th Edition -  Clinical stage from 07/07/2022: Stage IB (cT2, cN0, cM0) - Signed by Malachy Mood, MD on 07/22/2023 Stage prefix: Initial  diagnosis Total positive nodes: 0   06/13/2023 Initial Diagnosis   Pancreatic adenocarcinoma (HCC)   08/06/2023 -  Chemotherapy   Patient is on Treatment Plan : PANCREAS Modified FOLFIRINOX q14d x 4 cycles        Discussed the use of AI scribe software for clinical note transcription with the patient, who gave verbal consent to proceed.  History of Present Illness   A 78 year old female with a history of pancreatic cancer presents for a follow-up visit. She has recently had a port placed and attended a chemotherapy class in preparation for her upcoming treatment. She reports no current stomach pain or other issues. She has experienced jaundice in the past, but her eyes appear clear at this visit. Her appetite is not back to normal, but she is eating better. She has picked up her prescribed nausea medication in preparation for chemotherapy. She expresses concern about the potential for inclement weather to interfere with her ability to attend her scheduled chemotherapy session.         All other systems were reviewed with the patient and are negative.  MEDICAL HISTORY:  Past Medical History:  Diagnosis Date   Allergy    Ambulates with cane    straight cane   Arthritis    knee, back   CHF (congestive heart failure) (HCC)    Ductal carcinoma in situ of breast 12/2018   R Breast-mastectomy only   Gout    History of blood transfusion 1986   w/ Hysterectomy surgery   Hyperlipidemia    diet controlled - no meds   Hypertension    Hypothyroidism    Pelvic kidney    lower right pelvic kidney    SBO (small bowel obstruction) (HCC) 10/2016   surgery    Sleep apnea    Mild - no mask needed per sleep study   Smoker    quit smoking 2018   Type 2 diabetes mellitus (HCC)     SURGICAL HISTORY: Past Surgical History:  Procedure Laterality Date   ABDOMINAL HYSTERECTOMY  1986   COMPLETE-precancerous   APPENDECTOMY     pt states it was removed when gallbladder was removed.    AUGMENTATION MAMMAPLASTY     BACK SURGERY     lower back   BILIARY BRUSHING  07/08/2023   Procedure: BILIARY BRUSHING;  Surgeon: Meridee Score Netty Starring., MD;  Location: [facility];  Service: Gastroenterology;;   BILIARY STENT PLACEMENT N/A 07/08/2023   Procedure: BILIARY STENT PLACEMENT;  Surgeon: Lemar Lofty., MD;  Location: [facility];  Service: Gastroenterology;  Laterality: N/A;   BIOPSY  06/14/2023   Procedure: BIOPSY;  Surgeon: Iva Boop, MD;  Location: [facility];  Service: Gastroenterology;;   BIOPSY  07/08/2023   Procedure: BIOPSY;  Surgeon: Lemar Lofty., MD;  Location: [facility];  Service: Gastroenterology;;   BREAST BIOPSY Right 05/12/2019   times 2   BREAST LUMPECTOMY WITH RADIOACTIVE SEED LOCALIZATION Right 03/18/2019   Procedure: RIGHT BREAST LUMPECTOMY WITH RADIOACTIVE SEED LOCALIZATION;  Surgeon: Griselda Miner, MD;  [facility];  Service: General;  Laterality: Right;   CHOLECYSTECTOMY     COLONOSCOPY  02/15/2008   [facility]    COLONOSCOPY WITH PROPOFOL  02/27/2018   Dr.Nandigam   ECTOPIC PREGNANCY SURGERY     ERCP N/A 06/14/2023   Procedure: ENDOSCOPIC RETROGRADE CHOLANGIOPANCREATOGRAPHY (ERCP);  Surgeon: Iva Boop,  MD;  Location: [facility];  Service: Gastroenterology;  Laterality: N/A;   ERCP N/A 06/27/2023   Procedure: ENDOSCOPIC RETROGRADE CHOLANGIOPANCREATOGRAPHY (ERCP);  Surgeon: Lynann Bologna, MD;  Location: [facility];  Service: Gastroenterology;  Laterality: N/A;   ERCP N/A 07/08/2023   Procedure: ENDOSCOPIC RETROGRADE CHOLANGIOPANCREATOGRAPHY (ERCP);  Surgeon: Lemar Lofty., MD;  Location: [facility];  Service: Gastroenterology;  Laterality: N/A;   ESOPHAGOGASTRODUODENOSCOPY N/A 07/08/2023   Procedure: ESOPHAGOGASTRODUODENOSCOPY (EGD);  Surgeon: Lemar Lofty., MD;  Location: [facility];  Service: Gastroenterology;  Laterality: N/A;   ESOPHAGOGASTRODUODENOSCOPY (EGD) WITH PROPOFOL N/A 06/14/2023    Procedure: ESOPHAGOGASTRODUODENOSCOPY (EGD) WITH PROPOFOL;  Surgeon: Iva Boop, MD;  Location: [facility];  Service: Gastroenterology;  Laterality: N/A;   EUS N/A 07/08/2023   Procedure: UPPER ENDOSCOPIC ULTRASOUND (EUS) LINEAR;  Surgeon: Lemar Lofty., MD;  Location: [facility];  Service: Gastroenterology;  Laterality: N/A;   FINE NEEDLE ASPIRATION N/A 07/08/2023   Procedure: FINE NEEDLE ASPIRATION (FNA) LINEAR;  Surgeon: Lemar Lofty., MD;  Location: [facility];  Service: Gastroenterology;  Laterality: N/A;   IR IMAGING GUIDED PORT INSERTION  07/29/2023   JOINT REPLACEMENT     Left hip total Dr. Lequita Halt 10-01-17   MALONEY DILATION  06/14/2023   Procedure: Elease Hashimoto DILATION;  Surgeon: Iva Boop, MD;  Location: [facility];  Service: Gastroenterology;;   MASTECTOMY Right 07/26/2019   PANCREATIC STENT PLACEMENT  06/14/2023   Procedure: PANCREATIC STENT PLACEMENT;  Surgeon: Iva Boop, MD;  Location: [facility];  Service: Gastroenterology;;   PANCREATIC STENT PLACEMENT  06/27/2023   Procedure: PANCREATIC STENT PLACEMENT;  Surgeon: Lynann Bologna, MD;  Location: [facility];  Service: Gastroenterology;;   POLYPECTOMY     polypectomy-oropharynx     RE-EXCISION OF BREAST CANCER,SUPERIOR MARGINS Right 04/08/2019   Procedure: RE-EXCISION OF RIGHT BREAST ANTERIOR MARGINS;  Surgeon: Chevis Pretty III, MD;  Location: [facility];  Service: General;  Laterality: Right;   right knee meniscus     SBO  10/2016   small bowel obstruction   SIMPLE MASTECTOMY WITH AXILLARY SENTINEL NODE BIOPSY Right 07/26/2019   Procedure: RIGHT MASTECTOMY WITH SENTINEL NODE BIOPSY;  Surgeon: Griselda Miner, MD;  Location: [facility];  Service: General;  Laterality: Right;   SPHINCTEROTOMY  06/27/2023   Procedure: SPHINCTEROTOMY;  Surgeon: Lynann Bologna, MD;  Location: [facility];  Service: Gastroenterology;;   Dennison Mascot  07/08/2023   Procedure: Dennison Mascot;  Surgeon:  Lemar Lofty., MD;  Location: [facility];  Service: Gastroenterology;;   TOTAL HIP ARTHROPLASTY Left 10/01/2017   Procedure: LEFT  TOTAL HIP ARTHROPLASTY ANTERIOR APPROACH;  Surgeon: Ollen Gross, MD;  Location: [facility];  Service: Orthopedics;  Laterality: Left;   TOTAL HIP ARTHROPLASTY Right 03/11/2018   Procedure: RIGHT TOTAL HIP ARTHROPLASTY ANTERIOR APPROACH;  Surgeon: Ollen Gross, MD;  Location: [facility];  Service: Orthopedics;  Laterality: Right;    UPPER GASTROINTESTINAL ENDOSCOPY      I have reviewed the social history and family history with the patient and they are unchanged from previous note.  ALLERGIES:  is allergic to lisinopril, statins, and atorvastatin.  MEDICATIONS:  Current Outpatient Medications  Medication Sig Dispense Refill   Cholecalciferol (VITAMIN D-3) 125 MCG (5000 UT) TABS Take 1 tablet by mouth daily.     famotidine (PEPCID) 20 MG tablet Take 1 tablet (20 mg total) by mouth 2 (two) times daily. (Patient taking differently: Take 20 mg by mouth daily.) 30 tablet 1   fluticasone (FLONASE) 50 MCG/ACT  nasal spray Place 2 sprays into both nostrils daily. (Patient not taking: Reported on 07/31/2023) 16 g 6   Glucose Blood (BLOOD GLUCOSE TEST STRIPS) STRP 1 each by In Vitro route in the morning, at noon, and at bedtime. May substitute to any manufacturer covered by patient's insurance. 100 strip 5   insulin aspart (NOVOLOG) 100 UNIT/ML FlexPen Inject 0-6 Units into the skin 3 (three) times daily with meals. Check Blood Glucose (BG) and inject per scale: BG <150= 0 unit; BG 150-200= 1 unit; BG 201-250= 2 unit; BG 251-300= 3 unit; BG 301-350= 4 unit; BG 351-400= 5 unit; BG >400= 6 unit and Call Primary Care. 15 mL 0   Insulin Glargine (BASAGLAR KWIKPEN) 100 UNIT/ML Inject 12 Units into the skin daily. May substitute as needed per insurance. (Patient taking differently: Inject 12 Units into the skin at bedtime.) 15 mL 2   Lancets (ONETOUCH DELICA PLUS  LANCET33G) MISC USE TO CHECK BLOOD SUGAR IN THE MORNING, AT NOON, AND AT BEDTIME. 100 each 0   Lancets Misc. MISC 1 each by Does not apply route in the morning, at noon, and at bedtime. May substitute to any manufacturer covered by patient's insurance. 100 each 5   lidocaine-prilocaine (EMLA) cream Apply 1 Application topically as needed. 30 g 2   losartan (COZAAR) 100 MG tablet Take 1 tablet by mouth once daily 90 tablet 0   NIFEdipine (PROCARDIA XL/NIFEDICAL XL) 60 MG 24 hr tablet Take 1 tablet (60 mg total) by mouth daily. 30 tablet 0   ondansetron (ZOFRAN) 8 MG tablet Take 1 tablet (8 mg total) by mouth every 8 (eight) hours as needed for nausea or vomiting. 20 tablet 1   prochlorperazine (COMPAZINE) 10 MG tablet Take 1 tablet (10 mg total) by mouth every 6 (six) hours as needed for nausea or vomiting. 30 tablet 2   thyroid (NP THYROID) 90 MG tablet Take 1 tablet (90 mg total) by mouth daily. 30 tablet 5   No current facility-administered medications for this visit.    PHYSICAL EXAMINATION: ECOG PERFORMANCE STATUS: 0 - Asymptomatic  Vitals:   08/05/23 1401 08/05/23 1402  BP: (!) 164/71 (!) 171/82  Pulse: 67   Resp: 20   Temp: (!) 97.3 F (36.3 C)   SpO2: 100%    Wt Readings from Last 3 Encounters:  08/05/23 74.6 kg  07/31/23 75.1 kg  07/29/23 75.2 kg     GENERAL:alert, no distress and comfortable SKIN: skin color, texture, turgor are normal, no rashes or significant lesions EYES: normal, Conjunctiva are pink and non-injected, sclera clear NECK: supple, thyroid normal size, non-tender, without nodularity LYMPH:  no palpable lymphadenopathy in the cervical, axillary  LUNGS: clear to auscultation and percussion with normal breathing effort HEART: regular rate & rhythm and no murmurs and no lower extremity edema ABDOMEN:abdomen soft, non-tender and normal bowel sounds Musculoskeletal:no cyanosis of digits and no clubbing  NEURO: alert & oriented x 3 with fluent speech, no focal  motor/sensory deficits  Physical Exam   HEENT: Scleral icterus observed, eyes clear.      LABORATORY DATA:  I have reviewed the data as listed    Latest Ref Rng & Units 08/05/2023    1:18 PM 07/23/2023   11:13 AM 07/09/2023    6:04 AM  CBC  WBC 4.0 - 10.5 K/uL 7.7  8.0  5.8   Hemoglobin 12.0 - 15.0 g/dL 81.1  91.4  78.2   Hematocrit 36.0 - 46.0 % 34.0  36.4  31.4  Platelets 150 - 400 K/uL 227  261  211         Latest Ref Rng & Units 08/05/2023    1:18 PM 07/23/2023   11:13 AM 07/09/2023    6:04 AM  CMP  Glucose 70 - 99 mg/dL 161  096  045   BUN 8 - 23 mg/dL 16  13  <5   Creatinine 0.44 - 1.00 mg/dL 4.09  8.11  9.14   Sodium 135 - 145 mmol/L 139  145  136   Potassium 3.5 - 5.1 mmol/L 3.5  3.7  3.7   Chloride 98 - 111 mmol/L 105  109  108   CO2 22 - 32 mmol/L 28  29  18    Calcium 8.9 - 10.3 mg/dL 9.3  9.5  8.1   Total Protein 6.5 - 8.1 g/dL 6.9  7.0  5.5   Total Bilirubin 0.0 - 1.2 mg/dL 1.1  1.7  4.8   Alkaline Phos 38 - 126 U/L 339  607  816   AST 15 - 41 U/L 34  78  197   ALT 0 - 44 U/L 45  165  284       RADIOGRAPHIC STUDIES: I have personally reviewed the radiological images as listed and agreed with the findings in the report. No results found.    No orders of the defined types were placed in this encounter.  All questions were answered. The patient knows to call the clinic with any problems, questions or concerns. No barriers to learning was detected. The total time spent in the appointment was 30 minutes.     Malachy Mood, MD 08/05/2023

## 2023-08-05 NOTE — Telephone Encounter (Signed)
POC testing-- Per request of Dr. Mosetta Putt, submitted Lendon Collar TRF for BRCAPlus with reflex to CancerNext-Expanded +RNAinsight Panel.

## 2023-08-05 NOTE — Telephone Encounter (Unsigned)
Copied from CRM [id number]. Topic: General - Other >> Aug 04, 2023  2:49 PM Prudencio Pair wrote: Reason for CRM: Patient called stating that she saw Dr. Allena Katz last week. States she was reading some of his comments in her chart & he stated she had not had her Leqvio shot for her cholesterol. Patient states she had the first dose on 04/22/23 & the second dose was on 07/24/23. She wanted to let him know so that her chart could be updated & she stated she will also call the place where she had the shot administered so they can update & put it in her chart & also have them to send the information over to Dr. Allena Katz.

## 2023-08-06 ENCOUNTER — Inpatient Hospital Stay: Payer: PPO

## 2023-08-06 ENCOUNTER — Other Ambulatory Visit: Payer: PPO

## 2023-08-06 ENCOUNTER — Ambulatory Visit: Payer: PPO | Admitting: Hematology

## 2023-08-06 VITALS — BP 173/89 | HR 68 | Temp 97.7°F | Resp 18

## 2023-08-06 DIAGNOSIS — C259 Malignant neoplasm of pancreas, unspecified: Secondary | ICD-10-CM

## 2023-08-06 DIAGNOSIS — Z5111 Encounter for antineoplastic chemotherapy: Secondary | ICD-10-CM | POA: Diagnosis not present

## 2023-08-06 MED ORDER — DEXTROSE 5 % IV SOLN
INTRAVENOUS | Status: DC
Start: 2023-08-06 — End: 2023-08-06

## 2023-08-06 MED ORDER — SODIUM CHLORIDE 0.9% FLUSH
10.0000 mL | INTRAVENOUS | Status: DC | PRN
  Administered 2023-08-06: 10 mL

## 2023-08-06 MED ORDER — SODIUM CHLORIDE 0.9 % IV SOLN
150.0000 mg | Freq: Once | INTRAVENOUS | Status: AC
  Administered 2023-08-06: 150 mg via INTRAVENOUS
  Filled 2023-08-06: qty 150

## 2023-08-06 MED ORDER — DEXAMETHASONE SODIUM PHOSPHATE 10 MG/ML IJ SOLN
10.0000 mg | Freq: Once | INTRAMUSCULAR | Status: AC
  Administered 2023-08-06: 10 mg via INTRAVENOUS
  Filled 2023-08-06: qty 1

## 2023-08-06 MED ORDER — ATROPINE SULFATE 1 MG/ML IV SOLN
0.5000 mg | Freq: Once | INTRAVENOUS | Status: AC | PRN
  Administered 2023-08-06: 0.5 mg via INTRAVENOUS
  Filled 2023-08-06: qty 1

## 2023-08-06 MED ORDER — OXALIPLATIN CHEMO INJECTION 100 MG/20ML
85.0000 mg/m2 | Freq: Once | INTRAVENOUS | Status: AC
  Administered 2023-08-06: 150 mg via INTRAVENOUS
  Filled 2023-08-06: qty 10

## 2023-08-06 MED ORDER — SODIUM CHLORIDE 0.9 % IV SOLN
150.0000 mg/m2 | Freq: Once | INTRAVENOUS | Status: AC
  Administered 2023-08-06: 300 mg via INTRAVENOUS
  Filled 2023-08-06: qty 15

## 2023-08-06 MED ORDER — SODIUM CHLORIDE 0.9 % IV SOLN
2400.0000 mg/m2 | INTRAVENOUS | Status: DC
  Administered 2023-08-06: 4350 mg via INTRAVENOUS
  Filled 2023-08-06: qty 87

## 2023-08-06 MED ORDER — PALONOSETRON HCL INJECTION 0.25 MG/5ML
0.2500 mg | Freq: Once | INTRAVENOUS | Status: AC
  Administered 2023-08-06: 0.25 mg via INTRAVENOUS
  Filled 2023-08-06: qty 5

## 2023-08-06 MED ORDER — SODIUM CHLORIDE 0.9 % IV SOLN
400.0000 mg/m2 | Freq: Once | INTRAVENOUS | Status: AC
  Administered 2023-08-06: 728 mg via INTRAVENOUS
  Filled 2023-08-06: qty 17.5

## 2023-08-06 NOTE — Patient Instructions (Signed)
[facility] - A DEPT OF [facility]  Discharge Instructions: Thank you for choosing [facility] to provide your oncology and hematology care.   If you have a lab appointment with the Cancer Center, please go directly to the Cancer Center and check in at the registration area.   Wear comfortable clothing and clothing appropriate for easy access to any Portacath or PICC line.   We strive to give you quality time with your provider. You may need to reschedule your appointment if you arrive late (15 or more minutes).  Arriving late affects you and other patients whose appointments are after yours.  Also, if you miss three or more appointments without notifying the office, you may be dismissed from the clinic at the provider's discretion.      For prescription refill requests, have your pharmacy contact our office and allow 72 hours for refills to be completed.    Today you received the following chemotherapy and/or immunotherapy agents: Oxaliplatin, Irinotecan, Leucovorin, Fluorouracil      To help prevent nausea and vomiting after your treatment, we encourage you to take your nausea medication as directed.  BELOW ARE SYMPTOMS THAT SHOULD BE REPORTED IMMEDIATELY: *FEVER GREATER THAN 100.4 F (38 C) OR HIGHER *CHILLS OR SWEATING *NAUSEA AND VOMITING THAT IS NOT CONTROLLED WITH YOUR NAUSEA MEDICATION *UNUSUAL SHORTNESS OF BREATH *UNUSUAL BRUISING OR BLEEDING *URINARY PROBLEMS (pain or burning when urinating, or frequent urination) *BOWEL PROBLEMS (unusual diarrhea, constipation, pain near the anus) TENDERNESS IN MOUTH AND THROAT WITH OR WITHOUT PRESENCE OF ULCERS (sore throat, sores in mouth, or a toothache) UNUSUAL RASH, SWELLING OR PAIN  UNUSUAL VAGINAL DISCHARGE OR ITCHING   Items with * indicate a potential emergency and should be followed up as soon as possible or go to the Emergency Department if any problems should occur.  Please show the  CHEMOTHERAPY ALERT CARD or IMMUNOTHERAPY ALERT CARD at check-in to the Emergency Department and triage nurse.  Should you have questions after your visit or need to cancel or reschedule your appointment, please contact [facility] - A DEPT OF [facility]  Dept: [phone]  and follow the prompts.  Office hours are 8:00 a.m. to 4:30 p.m. Monday - Friday. Please note that voicemails left after 4:00 p.m. may not be returned until the following business day.  We are closed weekends and major holidays. You have access to a nurse at all times for urgent questions. Please call the main number to the clinic Dept: [phone] and follow the prompts.   For any non-urgent questions, you may also contact your provider using MyChart. We now offer e-Visits for anyone 79 and older to request care online for non-urgent symptoms. For details visit [email or web address].   Also download the MyChart app! Go to the app store, search "MyChart", open the app, select , and log in with your MyChart username and password.  Oxaliplatin Injection What is this medication? OXALIPLATIN (ox AL i PLA tin) treats colorectal cancer. It works by slowing down the growth of cancer cells. This medicine may be used for other purposes; ask your health care provider or pharmacist if you have questions. COMMON BRAND NAME(S): Eloxatin What should I tell my care team before I take this medication? They need to know if you have any of these conditions: Heart disease History of irregular heartbeat or rhythm Liver disease Low blood cell levels (white cells, red cells,  and platelets) Lung or breathing disease, such as asthma Take medications that treat or prevent blood clots Tingling of the fingers, toes, or other nerve disorder An unusual or allergic reaction to oxaliplatin, other medications, foods, dyes, or preservatives If you or your partner are pregnant or trying to get  pregnant Breast-feeding How should I use this medication? This medication is injected into a vein. It is given by your care team in a hospital or clinic setting. Talk to your care team about the use of this medication in children. Special care may be needed. Overdosage: If you think you have taken too much of this medicine contact a poison control center or emergency room at once. NOTE: This medicine is only for you. Do not share this medicine with others. What if I miss a dose? Keep appointments for follow-up doses. It is important not to miss a dose. Call your care team if you are unable to keep an appointment. What may interact with this medication? Do not take this medication with any of the following: Cisapride Dronedarone Pimozide Thioridazine This medication may also interact with the following: Aspirin and aspirin-like medications Certain medications that treat or prevent blood clots, such as warfarin, apixaban, dabigatran, and rivaroxaban Cisplatin Cyclosporine Diuretics Medications for infection, such as acyclovir, adefovir, amphotericin B, bacitracin, cidofovir, foscarnet, ganciclovir, gentamicin, pentamidine, vancomycin NSAIDs, medications for pain and inflammation, such as ibuprofen or naproxen Other medications that cause heart rhythm changes Pamidronate Zoledronic acid This list may not describe all possible interactions. Give your health care provider a list of all the medicines, herbs, non-prescription drugs, or dietary supplements you use. Also tell them if you smoke, drink alcohol, or use illegal drugs. Some items may interact with your medicine. What should I watch for while using this medication? Your condition will be monitored carefully while you are receiving this medication. You may need blood work while taking this medication. This medication may make you feel generally unwell. This is not uncommon as chemotherapy can affect healthy cells as well as cancer  cells. Report any side effects. Continue your course of treatment even though you feel ill unless your care team tells you to stop. This medication may increase your risk of getting an infection. Call your care team for advice if you get a fever, chills, sore throat, or other symptoms of a cold or flu. Do not treat yourself. Try to avoid being around people who are sick. Avoid taking medications that contain aspirin, acetaminophen, ibuprofen, naproxen, or ketoprofen unless instructed by your care team. These medications may hide a fever. Be careful brushing or flossing your teeth or using a toothpick because you may get an infection or bleed more easily. If you have any dental work done, tell your dentist you are receiving this medication. This medication can make you more sensitive to cold. Do not drink cold drinks or use ice. Cover exposed skin before coming in contact with cold temperatures or cold objects. When out in cold weather wear warm clothing and cover your mouth and nose to warm the air that goes into your lungs. Tell your care team if you get sensitive to the cold. Talk to your care team if you or your partner are pregnant or think either of you might be pregnant. This medication can cause serious birth defects if taken during pregnancy and for 9 months after the last dose. A negative pregnancy test is required before starting this medication. A reliable form of contraception is recommended while taking this  medication and for 9 months after the last dose. Talk to your care team about effective forms of contraception. Do not father a child while taking this medication and for 6 months after the last dose. Use a condom while having sex during this time period. Do not breastfeed while taking this medication and for 3 months after the last dose. This medication may cause infertility. Talk to your care team if you are concerned about your fertility. What side effects may I notice from receiving this  medication? Side effects that you should report to your care team as soon as possible: Allergic reactions--skin rash, itching, hives, swelling of the face, lips, tongue, or throat Bleeding--bloody or black, tar-like stools, vomiting blood or brown material that looks like coffee grounds, red or dark brown urine, small red or purple spots on skin, unusual bruising or bleeding Dry cough, shortness of breath or trouble breathing Heart rhythm changes--fast or irregular heartbeat, dizziness, feeling faint or lightheaded, chest pain, trouble breathing Infection--fever, chills, cough, sore throat, wounds that don't heal, pain or trouble when passing urine, general feeling of discomfort or being unwell Liver injury--right upper belly pain, loss of appetite, nausea, light-colored stool, dark yellow or brown urine, yellowing skin or eyes, unusual weakness or fatigue Low red blood cell level--unusual weakness or fatigue, dizziness, headache, trouble breathing Muscle injury--unusual weakness or fatigue, muscle pain, dark yellow or brown urine, decrease in amount of urine Pain, tingling, or numbness in the hands or feet Sudden and severe headache, confusion, change in vision, seizures, which may be signs of posterior reversible encephalopathy syndrome (PRES) Unusual bruising or bleeding Side effects that usually do not require medical attention (report to your care team if they continue or are bothersome): Diarrhea Nausea Pain, redness, or swelling with sores inside the mouth or throat Unusual weakness or fatigue Vomiting This list may not describe all possible side effects. Call your doctor for medical advice about side effects. You may report side effects to FDA at 1-800-FDA-1088. Where should I keep my medication? This medication is given in a hospital or clinic. It will not be stored at home. NOTE: This sheet is a summary. It may not cover all possible information. If you have questions about this  medicine, talk to your doctor, pharmacist, or health care provider.  2024 Elsevier/Gold Standard (2023-05-23 00:00:00)  Irinotecan Injection What is this medication? IRINOTECAN (ir in oh TEE kan) treats some types of cancer. It works by slowing down the growth of cancer cells. This medicine may be used for other purposes; ask your health care provider or pharmacist if you have questions. COMMON BRAND NAME(S): Camptosar What should I tell my care team before I take this medication? They need to know if you have any of these conditions: Dehydration Diarrhea Infection, especially a viral infection, such as chickenpox, cold sores, herpes Liver disease Low blood cell levels (white cells, red cells, and platelets) Low levels of electrolytes, such as calcium, magnesium, or potassium in your blood Recent or ongoing radiation An unusual or allergic reaction to irinotecan, other medications, foods, dyes, or preservatives If you or your partner are pregnant or trying to get pregnant Breast-feeding How should I use this medication? This medication is injected into a vein. It is given by your care team in a hospital or clinic setting. Talk to your care team about the use of this medication in children. Special care may be needed. Overdosage: If you think you have taken too much of this medicine contact a  poison control center or emergency room at once. NOTE: This medicine is only for you. Do not share this medicine with others. What if I miss a dose? Keep appointments for follow-up doses. It is important not to miss your dose. Call your care team if you are unable to keep an appointment. What may interact with this medication? Do not take this medication with any of the following: Cobicistat Itraconazole This medication may also interact with the following: Certain antibiotics, such as clarithromycin, rifampin, rifabutin Certain antivirals for HIV or AIDS Certain medications for fungal  infections, such as ketoconazole, posaconazole, voriconazole Certain medications for seizures, such as carbamazepine, phenobarbital, phenytoin Gemfibrozil Nefazodone St. John's wort This list may not describe all possible interactions. Give your health care provider a list of all the medicines, herbs, non-prescription drugs, or dietary supplements you use. Also tell them if you smoke, drink alcohol, or use illegal drugs. Some items may interact with your medicine. What should I watch for while using this medication? Your condition will be monitored carefully while you are receiving this medication. You may need blood work while taking this medication. This medication may make you feel generally unwell. This is not uncommon as chemotherapy can affect healthy cells as well as cancer cells. Report any side effects. Continue your course of treatment even though you feel ill unless your care team tells you to stop. This medication can cause serious side effects. To reduce the risk, your care team may give you other medications to take before receiving this one. Be sure to follow the directions from your care team. This medication may affect your coordination, reaction time, or judgement. Do not drive or operate machinery until you know how this medication affects you. Sit up or stand slowly to reduce the risk of dizzy or fainting spells. Drinking alcohol with this medication can increase the risk of these side effects. This medication may increase your risk of getting an infection. Call your care team for advice if you get a fever, chills, sore throat, or other symptoms of a cold or flu. Do not treat yourself. Try to avoid being around people who are sick. Avoid taking medications that contain aspirin, acetaminophen, ibuprofen, naproxen, or ketoprofen unless instructed by your care team. These medications may hide a fever. This medication may increase your risk to bruise or bleed. Call your care team if you  notice any unusual bleeding. Be careful brushing or flossing your teeth or using a toothpick because you may get an infection or bleed more easily. If you have any dental work done, tell your dentist you are receiving this medication. Talk to your care team if you or your partner are pregnant or think either of you might be pregnant. This medication can cause serious birth defects if taken during pregnancy and for 6 months after the last dose. You will need a negative pregnancy test before starting this medication. Contraception is recommended while taking this medication and for 6 months after the last dose. Your care team can help you find the option that works for you. Do not father a child while taking this medication and for 3 months after the last dose. Use a condom for contraception during this time period. Do not breastfeed while taking this medication and for 7 days after the last dose. This medication may cause infertility. Talk to your care team if you are concerned about your fertility. What side effects may I notice from receiving this medication? Side effects that you should  report to your care team as soon as possible: Allergic reactions--skin rash, itching, hives, swelling of the face, lips, tongue, or throat Dry cough, shortness of breath or trouble breathing Increased saliva or tears, increased sweating, stomach cramping, diarrhea, small pupils, unusual weakness or fatigue, slow heartbeat Infection--fever, chills, cough, sore throat, wounds that don't heal, pain or trouble when passing urine, general feeling of discomfort or being unwell Kidney injury--decrease in the amount of urine, swelling of the ankles, hands, or feet Low red blood cell level--unusual weakness or fatigue, dizziness, headache, trouble breathing Severe or prolonged diarrhea Unusual bruising or bleeding Side effects that usually do not require medical attention (report to your care team if they continue or are  bothersome): Constipation Diarrhea Hair loss Loss of appetite Nausea Stomach pain This list may not describe all possible side effects. Call your doctor for medical advice about side effects. You may report side effects to FDA at 1-800-FDA-1088. Where should I keep my medication? This medication is given in a hospital or clinic. It will not be stored at home. NOTE: This sheet is a summary. It may not cover all possible information. If you have questions about this medicine, talk to your doctor, pharmacist, or health care provider.  2024 Elsevier/Gold Standard (2021-10-22 00:00:00)  Leucovorin Injection What is this medication? LEUCOVORIN (loo koe VOR in) prevents side effects from certain medications, such as methotrexate. It works by increasing folate levels. This helps protect healthy cells in your body. It may also be used to treat anemia caused by low levels of folate. It can also be used with fluorouracil, a type of chemotherapy, to treat colorectal cancer. It works by increasing the effects of fluorouracil in the body. This medicine may be used for other purposes; ask your health care provider or pharmacist if you have questions. What should I tell my care team before I take this medication? They need to know if you have any of these conditions: Anemia from low levels of vitamin B12 in the blood An unusual or allergic reaction to leucovorin, folic acid, other medications, foods, dyes, or preservatives Pregnant or trying to get pregnant Breastfeeding How should I use this medication? This medication is injected into a vein or a muscle. It is given by your care team in a hospital or clinic setting. Talk to your care team about the use of this medication in children. Special care may be needed. Overdosage: If you think you have taken too much of this medicine contact a poison control center or emergency room at once. NOTE: This medicine is only for you. Do not share this medicine with  others. What if I miss a dose? Keep appointments for follow-up doses. It is important not to miss your dose. Call your care team if you are unable to keep an appointment. What may interact with this medication? Capecitabine Fluorouracil Phenobarbital Phenytoin Primidone Trimethoprim;sulfamethoxazole This list may not describe all possible interactions. Give your health care provider a list of all the medicines, herbs, non-prescription drugs, or dietary supplements you use. Also tell them if you smoke, drink alcohol, or use illegal drugs. Some items may interact with your medicine. What should I watch for while using this medication? Your condition will be monitored carefully while you are receiving this medication. This medication may increase the side effects of 5-fluorouracil. Tell your care team if you have diarrhea or mouth sores that do not get better or that get worse. What side effects may I notice from receiving this medication?  Side effects that you should report to your care team as soon as possible: Allergic reactions--skin rash, itching, hives, swelling of the face, lips, tongue, or throat This list may not describe all possible side effects. Call your doctor for medical advice about side effects. You may report side effects to FDA at 1-800-FDA-1088. Where should I keep my medication? This medication is given in a hospital or clinic. It will not be stored at home. NOTE: This sheet is a summary. It may not cover all possible information. If you have questions about this medicine, talk to your doctor, pharmacist, or health care provider.  2024 Elsevier/Gold Standard (2021-11-13 00:00:00)  Fluorouracil Injection What is this medication? FLUOROURACIL (flure oh YOOR a sil) treats some types of cancer. It works by slowing down the growth of cancer cells. This medicine may be used for other purposes; ask your health care provider or pharmacist if you have questions. COMMON BRAND  NAME(S): Adrucil What should I tell my care team before I take this medication? They need to know if you have any of these conditions: Blood disorders Dihydropyrimidine dehydrogenase (DPD) deficiency Infection, such as chickenpox, cold sores, herpes Kidney disease Liver disease Poor nutrition Recent or ongoing radiation therapy An unusual or allergic reaction to fluorouracil, other medications, foods, dyes, or preservatives If you or your partner are pregnant or trying to get pregnant Breast-feeding How should I use this medication? This medication is injected into a vein. It is administered by your care team in a hospital or clinic setting. Talk to your care team about the use of this medication in children. Special care may be needed. Overdosage: If you think you have taken too much of this medicine contact a poison control center or emergency room at once. NOTE: This medicine is only for you. Do not share this medicine with others. What if I miss a dose? Keep appointments for follow-up doses. It is important not to miss your dose. Call your care team if you are unable to keep an appointment. What may interact with this medication? Do not take this medication with any of the following: Live virus vaccines This medication may also interact with the following: Medications that treat or prevent blood clots, such as warfarin, enoxaparin, dalteparin This list may not describe all possible interactions. Give your health care provider a list of all the medicines, herbs, non-prescription drugs, or dietary supplements you use. Also tell them if you smoke, drink alcohol, or use illegal drugs. Some items may interact with your medicine. What should I watch for while using this medication? Your condition will be monitored carefully while you are receiving this medication. This medication may make you feel generally unwell. This is not uncommon as chemotherapy can affect healthy cells as well as  cancer cells. Report any side effects. Continue your course of treatment even though you feel ill unless your care team tells you to stop. In some cases, you may be given additional medications to help with side effects. Follow all directions for their use. This medication may increase your risk of getting an infection. Call your care team for advice if you get a fever, chills, sore throat, or other symptoms of a cold or flu. Do not treat yourself. Try to avoid being around people who are sick. This medication may increase your risk to bruise or bleed. Call your care team if you notice any unusual bleeding. Be careful brushing or flossing your teeth or using a toothpick because you may get an  infection or bleed more easily. If you have any dental work done, tell your dentist you are receiving this medication. Avoid taking medications that contain aspirin, acetaminophen, ibuprofen, naproxen, or ketoprofen unless instructed by your care team. These medications may hide a fever. Do not treat diarrhea with over the counter products. Contact your care team if you have diarrhea that lasts more than 2 days or if it is severe and watery. This medication can make you more sensitive to the sun. Keep out of the sun. If you cannot avoid being in the sun, wear protective clothing and sunscreen. Do not use sun lamps, tanning beds, or tanning booths. Talk to your care team if you or your partner wish to become pregnant or think you might be pregnant. This medication can cause serious birth defects if taken during pregnancy and for 3 months after the last dose. A reliable form of contraception is recommended while taking this medication and for 3 months after the last dose. Talk to your care team about effective forms of contraception. Do not father a child while taking this medication and for 3 months after the last dose. Use a condom while having sex during this time period. Do not breastfeed while taking this  medication. This medication may cause infertility. Talk to your care team if you are concerned about your fertility. What side effects may I notice from receiving this medication? Side effects that you should report to your care team as soon as possible: Allergic reactions--skin rash, itching, hives, swelling of the face, lips, tongue, or throat Heart attack--pain or tightness in the chest, shoulders, arms, or jaw, nausea, shortness of breath, cold or clammy skin, feeling faint or lightheaded Heart failure--shortness of breath, swelling of the ankles, feet, or hands, sudden weight gain, unusual weakness or fatigue Heart rhythm changes--fast or irregular heartbeat, dizziness, feeling faint or lightheaded, chest pain, trouble breathing High ammonia level--unusual weakness or fatigue, confusion, loss of appetite, nausea, vomiting, seizures Infection--fever, chills, cough, sore throat, wounds that don't heal, pain or trouble when passing urine, general feeling of discomfort or being unwell Low red blood cell level--unusual weakness or fatigue, dizziness, headache, trouble breathing Pain, tingling, or numbness in the hands or feet, muscle weakness, change in vision, confusion or trouble speaking, loss of balance or coordination, trouble walking, seizures Redness, swelling, and blistering of the skin over hands and feet Severe or prolonged diarrhea Unusual bruising or bleeding Side effects that usually do not require medical attention (report to your care team if they continue or are bothersome): Dry skin Headache Increased tears Nausea Pain, redness, or swelling with sores inside the mouth or throat Sensitivity to light Vomiting This list may not describe all possible side effects. Call your doctor for medical advice about side effects. You may report side effects to FDA at 1-800-FDA-1088. Where should I keep my medication? This medication is given in a hospital or clinic. It will not be stored  at home. NOTE: This sheet is a summary. It may not cover all possible information. If you have questions about this medicine, talk to your doctor, pharmacist, or health care provider.  2024 Elsevier/Gold Standard (2021-10-16 00:00:00)

## 2023-08-07 ENCOUNTER — Ambulatory Visit: Payer: PPO | Admitting: Internal Medicine

## 2023-08-07 ENCOUNTER — Encounter: Payer: Self-pay | Admitting: Hematology

## 2023-08-07 ENCOUNTER — Telehealth: Payer: Self-pay

## 2023-08-07 NOTE — Telephone Encounter (Signed)
Adrienne [name] states that she is doing fine. She is eating, drinking, and urinating well. She knows to call the office at [phone] if  she has any questions or concerns.

## 2023-08-07 NOTE — Telephone Encounter (Signed)
-----   Message from Nurse Lanora Manis A sent at 08/06/2023  2:29 PM EST ----- Regarding: First timer 08/06/23- First time Folfirinox. Tolerated well. Dr. Mosetta Putt patient

## 2023-08-07 NOTE — Progress Notes (Signed)
Received voicemail from patient after receiving my card per my request.  Introduced myself as Dance movement psychotherapist and to screen for one-time [employer] to assist with personal expenses while going through treatment. Patient currently does not qualify to apply due to being insured and  not receiving government benefits such as food stamps.  Discussed [employer] whom assists patients whom live in [place] only. Advised patient she may receive an application at her appointment tomorrow and she may drop off along with supporting documents which is close to where she lives.  She has my card for any additional financial questions or concerns.

## 2023-08-08 ENCOUNTER — Inpatient Hospital Stay: Payer: PPO

## 2023-08-08 VITALS — BP 129/69 | HR 57 | Temp 98.0°F | Resp 18

## 2023-08-08 DIAGNOSIS — C259 Malignant neoplasm of pancreas, unspecified: Secondary | ICD-10-CM

## 2023-08-08 DIAGNOSIS — Z95828 Presence of other vascular implants and grafts: Secondary | ICD-10-CM

## 2023-08-08 DIAGNOSIS — Z5111 Encounter for antineoplastic chemotherapy: Secondary | ICD-10-CM | POA: Diagnosis not present

## 2023-08-08 MED ORDER — PEGFILGRASTIM-CBQV 6 MG/0.6ML ~~LOC~~ SOSY
6.0000 mg | PREFILLED_SYRINGE | Freq: Once | SUBCUTANEOUS | Status: AC
  Administered 2023-08-08: 6 mg via SUBCUTANEOUS
  Filled 2023-08-08: qty 0.6

## 2023-08-08 MED ORDER — SODIUM CHLORIDE 0.9% FLUSH
10.0000 mL | INTRAVENOUS | Status: DC | PRN
Start: 2023-08-08 — End: 2023-08-08

## 2023-08-08 MED ORDER — HEPARIN SOD (PORK) LOCK FLUSH 100 UNIT/ML IV SOLN: 250.0000 [IU] | Freq: Once | INTRAVENOUS | Status: DC | PRN

## 2023-08-08 MED ORDER — HEPARIN SOD (PORK) LOCK FLUSH 100 UNIT/ML IV SOLN: 500.0000 [IU] | Freq: Once | INTRAVENOUS | Status: DC | PRN

## 2023-08-08 MED ORDER — HEPARIN SOD (PORK) LOCK FLUSH 100 UNIT/ML IV SOLN
500.0000 [IU] | Freq: Once | INTRAVENOUS | Status: AC
  Administered 2023-08-08: 500 [IU]

## 2023-08-08 MED ORDER — SODIUM CHLORIDE 0.9% FLUSH
10.0000 mL | Freq: Once | INTRAVENOUS | Status: AC
  Administered 2023-08-08: 10 mL

## 2023-08-08 NOTE — Patient Instructions (Signed)

## 2023-08-08 NOTE — Progress Notes (Signed)
Pt. Here for port D/C.  Noted pt's Port was fairly new with glue intact.  No gauze noted under the dressing.  While pulling off the dressing the glue came off.  Notified Jae Dire in [facility]  She assessed the pt. And states ok to apply steri strips. Steri Strips applied.  Pt. And daughter [name] made aware to not scrub or rub the port area while showering.  To allow the steri strips to fall off and allow the water to run down over the port

## 2023-08-12 ENCOUNTER — Encounter (HOSPITAL_COMMUNITY): Payer: PPO

## 2023-08-12 ENCOUNTER — Ambulatory Visit (HOSPITAL_COMMUNITY): Payer: PPO

## 2023-08-14 ENCOUNTER — Ambulatory Visit (INDEPENDENT_AMBULATORY_CARE_PROVIDER_SITE_OTHER): Payer: PPO | Admitting: Otolaryngology

## 2023-08-14 ENCOUNTER — Ambulatory Visit: Payer: PPO | Admitting: Internal Medicine

## 2023-08-14 ENCOUNTER — Other Ambulatory Visit: Payer: Self-pay | Admitting: Internal Medicine

## 2023-08-14 DIAGNOSIS — C259 Malignant neoplasm of pancreas, unspecified: Secondary | ICD-10-CM | POA: Diagnosis not present

## 2023-08-14 DIAGNOSIS — I10 Essential (primary) hypertension: Secondary | ICD-10-CM

## 2023-08-14 DIAGNOSIS — I428 Other cardiomyopathies: Secondary | ICD-10-CM

## 2023-08-15 ENCOUNTER — Encounter: Payer: Self-pay | Admitting: Genetic Counselor

## 2023-08-15 DIAGNOSIS — Z1379 Encounter for other screening for genetic and chromosomal anomalies: Secondary | ICD-10-CM | POA: Insufficient documentation

## 2023-08-17 NOTE — Assessment & Plan Note (Signed)
 cT2N0M0, stage IB -Patient presented with obstructive jaundice, CT and MRI showed a 2.5 cm mass in the head of the pancreas, with pancreatic duct and biliary duct dilatation.  ERCP was attempted twice but failed due to deformed duodenum -She underwent ERCP and stent placement, EUS with fine-needle biopsy of the pancreatic mass by Dr. Meridee Score on July 08, 2023.  Biopsy confirmed adenocarcinoma. -Due to the tumor invasion to portal vein and SMV, neoadjuvant chemotherapy FOLFIRINOX was recommended. Initial dose started 08/06/2023. She tolerated fist cycle very well and has no new concerns or complaints.  -08/18/2023 - Cycle 2 day 1 FOFIRINOX. Will hold Udenyca on day 3 of treatment due to WBC 17.8 and ANC 14.3. -proceed with Cycle 3 day 1 as scheduled 09/02/2023.

## 2023-08-17 NOTE — Progress Notes (Unsigned)
 Patient Care Team: Anabel Halon, MD as PCP - General (Internal Medicine) Rollene Rotunda, MD as PCP - Cardiology (Cardiology) Donnelly Angelica, RN as Oncology Nurse Navigator Pershing Proud, RN as Oncology Nurse Navigator Griselda Miner, MD as Consulting Physician (General Surgery) Malachy Mood, MD as Consulting Physician (Hematology) Lonie Peak, MD as Attending Physician (Radiation Oncology) Erroll Luna, [facility] (Inactive) as Pharmacist (Pharmacist)  Clinic Day:  08/19/2023  Referring physician: Malachy Mood, MD  ASSESSMENT & PLAN:   Assessment & Plan: Pancreatic adenocarcinoma (HCC) cT2N0M0, stage IB -Patient presented with obstructive jaundice, CT and MRI showed a 2.5 cm mass in the head of the pancreas, with pancreatic duct and biliary duct dilatation.  ERCP was attempted twice but failed due to deformed duodenum -She underwent ERCP and stent placement, EUS with fine-needle biopsy of the pancreatic mass by Dr. Meridee Score on July 08, 2023.  Biopsy confirmed adenocarcinoma. -Due to the tumor invasion to portal vein and SMV, neoadjuvant chemotherapy FOLFIRINOX was recommended. Initial dose started 08/06/2023. She tolerated fist cycle very well and has no new concerns or complaints.  -08/18/2023 - Cycle 2 day 1 FOFIRINOX. Will hold Udenyca on day 3 of treatment due to WBC 17.8 and ANC 14.3. -proceed with Cycle 3 day 1 as scheduled 09/02/2023.    Leukocytosis  Likely from Udenyca infusion on 3rd day after initial treatment.  -will hold from this cycle however, will keep on treatment plan for future use when needed.   Hypokalemia Likely from chemotherapy.  Add potassium 10 mEq daily to medications and send to pharmacy today  Plan:  Labs reviewed. Satisfactory for treatment today.  Proceed with Cycle 2 Day 1 FOLFIRINOX -hold udenyca on day 3 of treatment due to leukocytosis. Reassess this at start of next cycle.  Labs/flush, follow up, and treatment as scheduled 09/02/2023.     The patient understands the plans discussed today and is in agreement with them.  She knows to contact our office if she develops concerns prior to her next appointment.  I provided 25 minutes of face-to-face time during this encounter and > 50% was spent counseling as documented under my assessment and plan.    Carlean Jews, NP  [facility] - A DEPT OF [facility] [address] [place] [address] Dept: [phone] Dept Fax: [phone]   No orders of the defined types were placed in this encounter.     CHIEF COMPLAINT:  CC:  pancreatic cancer   Current Treatment:  neoadjuvant chemotherapy with FOLFIRINOX  INTERVAL HISTORY:  Austina is here today for repeat clinical assessment. She wsa last seen by Dr. Mosetta Putt 08/05/2023.  She had first cycle of chemotherapy with FOLFIRINOX starting 08/06/2023.  Today, 08/19/2023, is cycle 2 day 1. Overall, she has tolerated treatment very well. Reports decreased appetite but this is not new with chemotherapy. She states she associates this more with diabetes. She denies chest pain, chest pressure, or shortness of breath. She denies headaches or visual disturbances. She denies abdominal pain, nausea, vomiting, or changes in bowel or bladder habits.  She denies blood in her stool. She denies fevers or chills. She denies pain. Her appetite is good. Her weight has been stable.  I have reviewed the past medical history, past surgical history, social history and family history with the patient and they are unchanged from previous note.  ALLERGIES:  is allergic to lisinopril, statins, and atorvastatin.  MEDICATIONS:  Current Outpatient Medications  Medication Sig Dispense Refill   Cholecalciferol (VITAMIN D-3) 125 MCG (5000 UT) TABS Take 1 tablet by mouth daily.     famotidine (PEPCID) 20 MG tablet Take 1 tablet (20 mg total) by mouth 2 (two) times daily. (Patient taking differently:  Take 20 mg by mouth daily.) 30 tablet 1   fluticasone (FLONASE) 50 MCG/ACT nasal spray Place 2 sprays into both nostrils daily. 16 g 6   Glucose Blood (BLOOD GLUCOSE TEST STRIPS) STRP 1 each by In Vitro route in the morning, at noon, and at bedtime. May substitute to any manufacturer covered by patient's insurance. 100 strip 5   insulin aspart (NOVOLOG) 100 UNIT/ML FlexPen Inject 0-6 Units into the skin 3 (three) times daily with meals. Check Blood Glucose (BG) and inject per scale: BG <150= 0 unit; BG 150-200= 1 unit; BG 201-250= 2 unit; BG 251-300= 3 unit; BG 301-350= 4 unit; BG 351-400= 5 unit; BG >400= 6 unit and Call Primary Care. 15 mL 0   Insulin Glargine (BASAGLAR KWIKPEN) 100 UNIT/ML Inject 12 Units into the skin daily. May substitute as needed per insurance. (Patient taking differently: Inject 12 Units into the skin at bedtime.) 15 mL 2   Lancets (ONETOUCH DELICA PLUS LANCET33G) MISC USE TO CHECK BLOOD SUGAR IN THE MORNING, AT NOON, AND AT BEDTIME. 100 each 0   Lancets Misc. MISC 1 each by Does not apply route in the morning, at noon, and at bedtime. May substitute to any manufacturer covered by patient's insurance. 100 each 5   lidocaine-prilocaine (EMLA) cream Apply 1 Application topically as needed. 30 g 2   losartan (COZAAR) 100 MG tablet Take 1 tablet by mouth once daily 90 tablet 0   NIFEdipine (PROCARDIA XL/NIFEDICAL XL) 60 MG 24 hr tablet Take 1 tablet (60 mg total) by mouth daily. 30 tablet 0   ondansetron (ZOFRAN) 8 MG tablet Take 1 tablet (8 mg total) by mouth every 8 (eight) hours as needed for nausea or vomiting. 20 tablet 1   potassium chloride (KLOR-CON) 10 MEQ tablet Take 1 tablet (10 mEq total) by mouth daily. 30 tablet 1   prochlorperazine (COMPAZINE) 10 MG tablet Take 1 tablet (10 mg total) by mouth every 6 (six) hours as needed for nausea or vomiting. 30 tablet 2   thyroid (NP THYROID) 90 MG tablet Take 1 tablet (90 mg total) by mouth daily. 30 tablet 5   No current  facility-administered medications for this visit.   Facility-Administered Medications Ordered in Other Visits  Medication Dose Route Frequency Provider Last Rate Last Admin   atropine injection 0.5 mg  0.5 mg Intravenous Once PRN Malachy Mood, MD       dextrose 5 % solution   Intravenous Continuous Malachy Mood, MD 10 mL/hr at 08/19/23 1005 New Bag at 08/19/23 1005   fluorouracil (ADRUCIL) 4,350 mg in sodium chloride 0.9 % 63 mL chemo infusion  2,400 mg/m2 (Treatment Plan Recorded) Intravenous 1 day or 1 dose Malachy Mood, MD       fosaprepitant (EMEND) 150 mg in sodium chloride 0.9 % 145 mL IVPB  150 mg Intravenous Once Malachy Mood, MD 450 mL/hr at 08/19/23 1014 150 mg at 08/19/23 1014   irinotecan (CAMPTOSAR) 300 mg in sodium chloride 0.9 % 500 mL chemo infusion  150 mg/m2 (Treatment Plan Recorded) Intravenous Once Malachy Mood, MD       leucovorin 728 mg in sodium chloride 0.9 % 250 mL infusion  400 mg/m2 (Treatment Plan Recorded)  Intravenous Once Malachy Mood, MD       oxaliplatin (ELOXATIN) 150 mg in dextrose 5 % 500 mL chemo infusion  85 mg/m2 (Treatment Plan Recorded) Intravenous Once Malachy Mood, MD        HISTORY OF PRESENT ILLNESS:   Oncology History  Ductal carcinoma in situ (DCIS) of right breast  01/28/2019 Mammogram   Diagnostic Mammogram 01/28/19  IMPRESSION: Right breast retroareolar 7 mm grouped pleomorphic calcifications are suspicious.   02/02/2019 Initial Biopsy   Diagnosis 02/02/19 Breast, right, needle core biopsy, outer retroareolar - DUCTAL CARCINOMA IN SITU WITH CALCIFICATIONS. Microscopic Comment The ductal carcinoma in situ is intermediate grade. Estrogen and progesterone receptors will be performed.  Results: IMMUNOHISTOCHEMICAL AND MORPHOMETRIC ANALYSIS PERFORMED MANUALLY Estrogen Receptor: 90%, POSITIVE, STRONG STAINING INTENSITY Progesterone Receptor: 20%, POSITIVE, STRONG STAINING INTENSITY    02/02/2019 Cancer Staging   Staging form: Breast, AJCC 8th Edition - Clinical  stage from 02/02/2019: Stage 0 (cTis (DCIS), cN0, cM0, G2, ER+, PR+, HER2: Not Assessed) - Signed by Malachy Mood, MD on 02/10/2019   02/04/2019 Initial Diagnosis   Ductal carcinoma in situ (DCIS) of right breast   03/18/2019 Surgery   RIGHT BREAST LUMPECTOMY WITH RADIOACTIVE SEED LOCALIZATION by Dr. Carolynne Edouard 03/18/19   03/18/2019 Pathology Results   FINAL MICROSCOPIC DIAGNOSIS: 03/18/19 A. BREAST, RIGHT, LUMPECTOMY:  - Ductal carcinoma in situ with calcifications and necrosis, 1.4 cm.  - Ductal carcinoma in situ focally 0.1 cm from medial and anterior  lumpectomy margins.  - Ductal carcinoma in situ involves the final anterior margin (See part  D).  - Ductal carcinoma in situ focally 0.3 cm from superior lumpectomy  margin.  - Biopsy site and biopsy clip.   B. BREAST, RIGHT LATERAL MARGIN, EXCISION:  - Benign breast tissue.  - No ductal carcinoma in situ.  - Final lateral margin greater than 1 cm.   C. BREAST, RIGHT MEDIAL MARGIN, EXCISION:  - Ductal carcinoma in situ, 0.3 cm.  - Ductal carcinoma in situ with focally 0.3 cm from final medial margin.   D. BREAST, RIGHT ANTERIOR MARGIN, EXCISION:  - Ductal carcinoma in situ, 1.5 cm.  - Ductal carcinoma in situ focally involves final anterior margin.   E. BREAST, RIGHT DEEP MARGIN, EXCISION:  - Fibrocystic changes.  - No ductal carcinoma in situ.  - Final posterior margin greater than 0.7 cm.    04/08/2019 Surgery   RE-EXCISION OF RIGHT BREAST ANTERIOR MARGINS by Dr Carolynne Edouard 04/08/19   04/08/2019 Pathology Results    DIAGNOSIS: 04/08/19  A. BREAST, RIGHT, ANTERIOR SUPERIOR, EXCISION:  - Intermediate grade ductal carcinoma in situ with necrosis.  - In situ carcinoma is <1 mm (not on ink) from the new anterior superior  margin, multifocally.  - Intraductal papilloma.  - Fibrocystic change.   B. BREAST, RIGHT, ANTERIOR INFERIOR, EXCISION:  - Benign breast tissue with resection changes.   C. BREAST, RIGHT, MEDIAL, EXCISION:  - Benign  breast tissue with resection changes.  - Fibroadenomatoid and fibrocystic changes.   05/06/2019 Mammogram   Right Mammogram 05/06/19 IMPRESSION: Suspicious finding with 2 groups of microcalcifications in the medial right breast. The anterior group measures 2 x 2 x 2 mm. The more posterior group measures 4 x 3 x 4 mm.   05/12/2019 Pathology Results   Diagnosis 05/12/19 1. Breast, right, needle core biopsy, medial - DUCTAL CARCINOMA IN SITU WITH CALCIFICATIONS. - SEE MICROSCOPIC DESCRIPTION. 2. Breast, right, needle core biopsy, medial - DUCTAL CARCINOMA IN SITU WITH CALCIFICATIONS. - SEE  MICROSCOPIC DESCRIPTION. Results: IMMUNOHISTOCHEMICAL AND MORPHOMETRIC ANALYSIS PERFORMED MANUALLY Estrogen Receptor: 100%, POSITIVE, STRONG STAINING INTENSITY Progesterone Receptor: 0%, NEGATIVE COMMENT: The negative hormone receptor study(ies) in this case has an internal positive control. Results: IMMUNOHISTOCHEMICAL AND MORPHOMETRIC ANALYSIS PERFORMED MANUALLY Estrogen Receptor: 100%, POSITIVE, STRONG STAINING INTENSITY Progesterone Receptor: 0%, NEGATIVE COMMENT: The negative hormone receptor study(ies) in this case has an internal positive control.   07/26/2019 Surgery   RIGHT MASTECTOMY WITH SENTINEL NODE BIOPSY by Dr. Carolynne Edouard 07/26/19   07/26/2019 Pathology Results   FINAL MICROSCOPIC DIAGNOSIS: 07/26/19  A. LYMPH NODE, RIGHT #1, SENTINEL,  BIOPSY:  -  No carcinoma identified in one lymph node (0/1)   B. BREAST, RIGHT, MASTECTOMY:  -  Ductal carcinoma in situ, intermediate grade, 0.6 cm  -  Margins uninvolved by carcinoma (greater than 1 cm; posterior margin)  -  Previous biopsy site changes (x2)  -  See oncology table below   07/26/2019 Cancer Staging   Staging form: Breast, AJCC 8th Edition - Pathologic stage from 07/26/2019: Stage 0 (pTis (DCIS), pN0, cM0, G2, ER+, PR+, HER2: Not Assessed) - Signed by Malachy Mood, MD on 08/10/2019   08/15/2023 Genetic Testing   Negative [facility]  CancerNext-Expanded +RNAinsight Panel.  VUS in ATM at c.4910-891G>A and SDHA at c.-1C>G.  Report date is 08/15/2023.   The CancerNext-Expanded gene panel offered by [facility] and includes sequencing, rearrangement, and RNA analysis for the following 76 genes: AIP, ALK, APC, ATM, AXIN2, BAP1, BARD1, BMPR1A, BRCA1, BRCA2, BRIP1, CDC73, CDH1, CDK4, CDKN1B, CDKN2A, CEBPA, CHEK2, CTNNA1, DDX41, DICER1, ETV6, FH, FLCN, GATA2, LZTR1, MAX, MBD4, MEN1, MET, MLH1, MSH2, MSH3, MSH6, MUTYH, NF1, NF2, NTHL1, PALB2, PHOX2B, PMS2, POT1, PRKAR1A, PTCH1, PTEN, RAD51C, RAD51D, RB1, RET, RUNX1, SDHA, SDHAF2, SDHB, SDHC, SDHD, SMAD4, SMARCA4, SMARCB1, SMARCE1, STK11, SUFU, TMEM127, TP53, TSC1, TSC2, VHL, and WT1 (sequencing and deletion/duplication); EGFR, HOXB13, KIT, MITF, PDGFRA, POLD1, and POLE (sequencing only); EPCAM and GREM1 (deletion/duplication only).    Pancreatic adenocarcinoma (HCC)  07/07/2022 Cancer Staging   Staging form: Exocrine Pancreas, AJCC 8th Edition - Clinical stage from 07/07/2022: Stage IB (cT2, cN0, cM0) - Signed by Malachy Mood, MD on 07/22/2023 Stage prefix: Initial diagnosis Total positive nodes: 0   06/13/2023 Initial Diagnosis   Pancreatic adenocarcinoma (HCC)   08/06/2023 -  Chemotherapy   Patient is on Treatment Plan : PANCREAS Modified FOLFIRINOX q14d x 4 cycles     08/15/2023 Genetic Testing   Negative [facility] CancerNext-Expanded +RNAinsight Panel.  VUS in ATM at c.4910-891G>A and SDHA at c.-1C>G.  Report date is 08/15/2023.   The CancerNext-Expanded gene panel offered by [facility] and includes sequencing, rearrangement, and RNA analysis for the following 76 genes: AIP, ALK, APC, ATM, AXIN2, BAP1, BARD1, BMPR1A, BRCA1, BRCA2, BRIP1, CDC73, CDH1, CDK4, CDKN1B, CDKN2A, CEBPA, CHEK2, CTNNA1, DDX41, DICER1, ETV6, FH, FLCN, GATA2, LZTR1, MAX, MBD4, MEN1, MET, MLH1, MSH2, MSH3, MSH6, MUTYH, NF1, NF2, NTHL1, PALB2, PHOX2B, PMS2, POT1, PRKAR1A, PTCH1, PTEN, RAD51C, RAD51D, RB1, RET, RUNX1,  SDHA, SDHAF2, SDHB, SDHC, SDHD, SMAD4, SMARCA4, SMARCB1, SMARCE1, STK11, SUFU, TMEM127, TP53, TSC1, TSC2, VHL, and WT1 (sequencing and deletion/duplication); EGFR, HOXB13, KIT, MITF, PDGFRA, POLD1, and POLE (sequencing only); EPCAM and GREM1 (deletion/duplication only).        REVIEW OF SYSTEMS:   Constitutional: Denies fevers, chills or abnormal weight loss Eyes: Denies blurriness of vision Ears, nose, mouth, throat, and face: Denies mucositis or sore throat Respiratory: Denies cough, dyspnea or wheezes Cardiovascular: Denies palpitation, chest discomfort or lower extremity swelling Gastrointestinal:  Denies  nausea, heartburn or change in bowel habits Skin: Denies abnormal skin rashes Lymphatics: Denies new lymphadenopathy or easy bruising Neurological:Denies numbness, tingling or new weaknesses Behavioral/Psych: Mood is stable, no new changes  All other systems were reviewed with the patient and are negative.   VITALS:   Today's Vitals   08/19/23 0924  BP: (!) 148/86  Pulse: 72  Resp: 19  Temp: 97.8 F (36.6 C)  TempSrc: Temporal  SpO2: 94%  Weight: 164 lb (74.4 kg)  PainSc: 0-No pain   Body mass index is 28.6 kg/m.   Wt Readings from Last 3 Encounters:  08/19/23 164 lb (74.4 kg)  08/05/23 164 lb 8 oz (74.6 kg)  07/31/23 165 lb 9.6 oz (75.1 kg)    Body mass index is 28.6 kg/m.  Performance status (ECOG): 1 - Symptomatic but completely ambulatory  PHYSICAL EXAM:   GENERAL:alert, no distress and comfortable SKIN: skin color, texture, turgor are normal, no rashes or significant lesions EYES: normal, Conjunctiva are pink and non-injected, sclera clear OROPHARYNX:no exudate, no erythema and lips, buccal mucosa, and tongue normal  NECK: supple, thyroid normal size, non-tender, without nodularity LYMPH:  no palpable lymphadenopathy in the cervical, axillary or inguinal LUNGS: clear to auscultation and percussion with normal breathing effort HEART: regular rate &  rhythm and no murmurs and no lower extremity edema ABDOMEN:abdomen soft, non-tender and normal bowel sounds Musculoskeletal:no cyanosis of digits and no clubbing  NEURO: alert & oriented x 3 with fluent speech, no focal motor/sensory deficits  LABORATORY DATA:  I have reviewed the data as listed    Component Value Date/Time   NA 142 08/19/2023 0905   NA 142 06/24/2023 0859   K 3.2 (L) 08/19/2023 0905   CL 108 08/19/2023 0905   CO2 26 08/19/2023 0905   GLUCOSE 171 (H) 08/19/2023 0905   BUN 9 08/19/2023 0905   BUN 19 06/24/2023 0859   CREATININE 0.90 08/19/2023 0905   CREATININE 1.05 (H) 12/05/2020 1027   CALCIUM 8.4 (L) 08/19/2023 0905   PROT 6.5 08/19/2023 0905   PROT 7.4 06/24/2023 0859   ALBUMIN 3.5 08/19/2023 0905   ALBUMIN 4.2 06/24/2023 0859   AST 26 08/19/2023 0905   ALT 27 08/19/2023 0905   ALKPHOS 231 (H) 08/19/2023 0905   BILITOT 0.6 08/19/2023 0905   GFRNONAA >60 08/19/2023 0905   GFRNONAA 67 08/29/2020 1048   GFRAA 78 08/29/2020 1048     Lab Results  Component Value Date   WBC 17.8 (H) 08/19/2023   NEUTROABS 14.3 (H) 08/19/2023   HGB 11.2 (L) 08/19/2023   HCT 33.5 (L) 08/19/2023   MCV 92.0 08/19/2023   PLT 210 08/19/2023      RADIOGRAPHIC STUDIES: IR IMAGING GUIDED PORT INSERTION Result Date: 07/29/2023 CLINICAL DATA:  Pancreatic carcinoma and need for porta cath for chemotherapy. EXAM: IMPLANTED PORT A CATH PLACEMENT WITH ULTRASOUND AND FLUOROSCOPIC GUIDANCE ANESTHESIA/SEDATION: Moderate (conscious) sedation was employed during this procedure. A total of Versed 2.0 mg and Fentanyl 100 mcg was administered intravenously. Moderate Sedation Time: 40 minutes. The patient's level of consciousness and vital signs were monitored continuously by radiology nursing throughout the procedure under my direct supervision. FLUOROSCOPY: 48 seconds.  2.0 mGy. PROCEDURE: The procedure, risks, benefits, and alternatives were explained to the patient. Questions regarding the  procedure were encouraged and answered. The patient understands and consents to the procedure. A time-out was performed prior to initiating the procedure. Ultrasound was utilized to confirm patency of the left internal jugular vein. Under ultrasound  image was saved a recorded. The left neck and chest were prepped with chlorhexidine in a sterile fashion, and a sterile drape was applied covering the operative field. Maximum barrier sterile technique with sterile gowns and gloves were used for the procedure. Local anesthesia was provided with 1% lidocaine. After creating a small venotomy incision, a 21 gauge needle was advanced into the left internal jugular vein under direct, real-time ultrasound guidance. Ultrasound image documentation was performed. After securing guidewire access, an 8 Fr dilator was placed. A J-wire was kinked to measure appropriate catheter length. A subcutaneous port pocket was then created along the upper chest wall utilizing sharp and blunt dissection. Portable cautery was utilized. The pocket was irrigated with sterile saline. A single lumen power injectable port was chosen for placement. The 8 Fr catheter was tunneled from the port pocket site to the venotomy incision. The port was placed in the pocket. External catheter was trimmed to appropriate length based on guidewire measurement. At the venotomy, an 8 Fr peel-away sheath was placed over a guidewire. The catheter was then placed through the sheath and the sheath removed. Final catheter positioning was confirmed and documented with a fluoroscopic spot image. The port was accessed with a needle and aspirated and flushed with heparinized saline. The access needle was removed. The venotomy and port pocket incisions were closed with subcutaneous 3-0 Monocryl and subcuticular 4-0 Vicryl. Dermabond was applied to both incisions. COMPLICATIONS: COMPLICATIONS None FINDINGS: After catheter placement, the tip lies at the cavo-atrial junction. The  catheter aspirates normally and is ready for immediate use. IMPRESSION: Placement of single lumen port a cath via left internal jugular vein. The catheter tip lies at the cavo-atrial junction. A power injectable port a cath was placed and is ready for immediate use. Electronically Signed   By: Irish Lack M.D.   On: 07/29/2023 16:05

## 2023-08-18 MED FILL — Fosaprepitant Dimeglumine For IV Infusion 150 MG (Base Eq): INTRAVENOUS | Qty: 5 | Status: AC

## 2023-08-18 NOTE — Telephone Encounter (Signed)
 [facility] CancerNext-Expanded +RNAinsight negative.  VUS in ATM and SDHA.  Disclosed results by telephone to daughter Adrienne [name].        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  At this time, it is unknown if this variant is associated with an increased risk for cancer or if it is benign, but most uncertain variants are reclassified to benign. It should not be used to make medical management decisions. With time, we suspect the laboratory will determine the significance of this variant, if any. If the laboratory reclassifies this variant, we will attempt to contact Adrienne [name] to discuss it further.    Even though a pathogenic variant was not identified, possible explanations for the cancer in the family may include: There may be no hereditary risk for cancer in the family. The cancers in Adrienne [name] and/or her family may be sporadic/familial or due to other genetic and environmental factors.  Most cancer is not hereditary.  There may be a gene mutation in one of these genes that current testing methods cannot detect but that chance is small. There could be another gene that has not yet been discovered, or that we have not yet tested, that is responsible for the cancer diagnoses in the family.  It is also possible there is a hereditary cause for the cancer in the family that Adrienne [name] did not inherit. The variant of uncertain significance detected in the ATM gene may be reclassified as a pathogenic variant in the future. At this time, we do not know if this variant increases the risk for cancer.  Therefore, it is important to remain in touch with cancer genetics in the future so that we can continue to offer Adrienne [name] the most up to date genetic testing.    CANCER SCREENING RECOMMENDATIONS:  Adrienne [name] test result is considered negative (normal).  This means that we have not identified a hereditary cause for her personal history of  cancer at this time.   An individual's cancer risk and medical management are not determined by genetic test results alone. Overall cancer risk assessment incorporates additional factors, including personal medical history, family history, and any available genetic information that may result in a personalized plan for cancer prevention and surveillance. Therefore, it is recommended she continue to follow the cancer management and screening guidelines provided by her oncology and primary healthcare provider.  RECOMMENDATIONS FOR FAMILY MEMBERS:   Of note, family history information/pedigree was not collected during phone call and therefore recommendations based on family history are limited.  Since she did not inherit a identifiable mutation in a cancer predisposition gene included on this panel, her children could not have inherited a known mutation from her in one of these genes. Individuals in this family might be at some increased risk of developing cancer, over the general population risk, due to the family history of cancer.  Individuals in the family should notify their providers of the family history of cancer. We recommend women in this family have a yearly mammogram beginning at age 31, or 54 years younger than the earliest onset of cancer, an annual clinical breast exam, and perform monthly breast self-exams.  Risk models that take into account family history and hormonal history may be helpful in determining appropriate breast cancer screening options for family members.  Others in the family may have a hereditary cancer mutation that Adrienne [name] did not inherit.  Relatives should notify providers of family history and  dicsuss if genetic testing is appropriate for them.  We do not recommend familial testing for the variants of uncertain significance (VUS).  FOLLOW-UP:  Cancer genetics is a rapidly advancing field and it is possible that new genetic tests will be appropriate for her and/or  her family members in the future. We encourage Adrienne [name] to remain in contact with cancer genetics, so we can update her personal and family histories and let her know of advances in cancer genetics that may benefit this family.     Cairo Lingenfelter M. Rennie Plowman, MS, [facility] Genetic Counselor [email or web address] (P) [phone]

## 2023-08-19 ENCOUNTER — Inpatient Hospital Stay: Payer: PPO | Admitting: Dietician

## 2023-08-19 ENCOUNTER — Encounter: Payer: Self-pay | Admitting: Nurse Practitioner

## 2023-08-19 ENCOUNTER — Inpatient Hospital Stay: Payer: PPO

## 2023-08-19 ENCOUNTER — Other Ambulatory Visit: Payer: Self-pay | Admitting: Nurse Practitioner

## 2023-08-19 ENCOUNTER — Encounter: Payer: Self-pay | Admitting: Hematology

## 2023-08-19 ENCOUNTER — Inpatient Hospital Stay (HOSPITAL_BASED_OUTPATIENT_CLINIC_OR_DEPARTMENT_OTHER): Payer: PPO | Admitting: Nurse Practitioner

## 2023-08-19 VITALS — BP 135/71 | HR 71 | Resp 18

## 2023-08-19 VITALS — BP 148/86 | HR 72 | Temp 97.8°F | Resp 19 | Wt 164.0 lb

## 2023-08-19 DIAGNOSIS — C259 Malignant neoplasm of pancreas, unspecified: Secondary | ICD-10-CM

## 2023-08-19 DIAGNOSIS — Z5111 Encounter for antineoplastic chemotherapy: Secondary | ICD-10-CM | POA: Diagnosis not present

## 2023-08-19 DIAGNOSIS — Z95828 Presence of other vascular implants and grafts: Secondary | ICD-10-CM

## 2023-08-19 LAB — CBC WITH DIFFERENTIAL (CANCER CENTER ONLY)
Abs Immature Granulocytes: 0.29 10*3/uL — ABNORMAL HIGH (ref 0.00–0.07)
Basophils Absolute: 0.1 10*3/uL (ref 0.0–0.1)
Basophils Relative: 0 %
Eosinophils Absolute: 0.1 10*3/uL (ref 0.0–0.5)
Eosinophils Relative: 1 %
HCT: 33.5 % — ABNORMAL LOW (ref 36.0–46.0)
Hemoglobin: 11.2 g/dL — ABNORMAL LOW (ref 12.0–15.0)
Immature Granulocytes: 2 %
Lymphocytes Relative: 14 %
Lymphs Abs: 2.4 10*3/uL (ref 0.7–4.0)
MCH: 30.8 pg (ref 26.0–34.0)
MCHC: 33.4 g/dL (ref 30.0–36.0)
MCV: 92 fL (ref 80.0–100.0)
Monocytes Absolute: 0.7 10*3/uL (ref 0.1–1.0)
Monocytes Relative: 4 %
Neutro Abs: 14.3 10*3/uL — ABNORMAL HIGH (ref 1.7–7.7)
Neutrophils Relative %: 79 %
Platelet Count: 210 10*3/uL (ref 150–400)
RBC: 3.64 MIL/uL — ABNORMAL LOW (ref 3.87–5.11)
RDW: 13.1 % (ref 11.5–15.5)
WBC Count: 17.8 10*3/uL — ABNORMAL HIGH (ref 4.0–10.5)
nRBC: 0 % (ref 0.0–0.2)

## 2023-08-19 LAB — CMP (CANCER CENTER ONLY)
ALT: 27 U/L (ref 0–44)
AST: 26 U/L (ref 15–41)
Albumin: 3.5 g/dL (ref 3.5–5.0)
Alkaline Phosphatase: 231 U/L — ABNORMAL HIGH (ref 38–126)
Anion gap: 8 (ref 5–15)
BUN: 9 mg/dL (ref 8–23)
CO2: 26 mmol/L (ref 22–32)
Calcium: 8.4 mg/dL — ABNORMAL LOW (ref 8.9–10.3)
Chloride: 108 mmol/L (ref 98–111)
Creatinine: 0.9 mg/dL (ref 0.44–1.00)
GFR, Estimated: >60 mL/min (ref >60)
Glucose, Bld: 171 mg/dL — ABNORMAL HIGH (ref 70–99)
Potassium: 3.2 mmol/L — ABNORMAL LOW (ref 3.5–5.1)
Sodium: 142 mmol/L (ref 135–145)
Total Bilirubin: 0.6 mg/dL (ref 0.0–1.2)
Total Protein: 6.5 g/dL (ref 6.5–8.1)

## 2023-08-19 MED ORDER — POTASSIUM CHLORIDE ER 10 MEQ PO TBCR: 10.0000 meq | EXTENDED_RELEASE_TABLET | Freq: Every day | ORAL | 1 refills | Status: DC

## 2023-08-19 MED ORDER — SODIUM CHLORIDE 0.9 % IV SOLN
400.0000 mg/m2 | Freq: Once | INTRAVENOUS | Status: AC
  Administered 2023-08-19: 728 mg via INTRAVENOUS
  Filled 2023-08-19: qty 25

## 2023-08-19 MED ORDER — DIPHENHYDRAMINE HCL 50 MG/ML IJ SOLN
50.0000 mg | Freq: Once | INTRAMUSCULAR | Status: AC | PRN
  Administered 2023-08-19: 25 mg via INTRAVENOUS

## 2023-08-19 MED ORDER — ATROPINE SULFATE 1 MG/ML IV SOLN
0.5000 mg | Freq: Once | INTRAVENOUS | Status: AC | PRN
  Administered 2023-08-19: 0.5 mg via INTRAVENOUS
  Filled 2023-08-19: qty 1

## 2023-08-19 MED ORDER — SODIUM CHLORIDE 0.9 % IV SOLN: Freq: Once | INTRAVENOUS | Status: DC | PRN

## 2023-08-19 MED ORDER — OXALIPLATIN CHEMO INJECTION 100 MG/20ML
85.0000 mg/m2 | Freq: Once | INTRAVENOUS | Status: AC
  Administered 2023-08-19: 150 mg via INTRAVENOUS
  Filled 2023-08-19: qty 10

## 2023-08-19 MED ORDER — SODIUM CHLORIDE 0.9% FLUSH
10.0000 mL | Freq: Once | INTRAVENOUS | Status: AC
  Administered 2023-08-19: 10 mL

## 2023-08-19 MED ORDER — SODIUM CHLORIDE 0.9% FLUSH
10.0000 mL | INTRAVENOUS | Status: DC | PRN
  Administered 2023-08-19: 10 mL

## 2023-08-19 MED ORDER — METHYLPREDNISOLONE SODIUM SUCC 125 MG IJ SOLR
125.0000 mg | Freq: Once | INTRAMUSCULAR | Status: AC | PRN
  Administered 2023-08-19: 125 mg via INTRAVENOUS

## 2023-08-19 MED ORDER — SODIUM CHLORIDE 0.9 % IV SOLN
2400.0000 mg/m2 | INTRAVENOUS | Status: DC
  Administered 2023-08-19: 4350 mg via INTRAVENOUS
  Filled 2023-08-19: qty 87

## 2023-08-19 MED ORDER — DEXTROSE 5 % IV SOLN
INTRAVENOUS | Status: DC
Start: 2023-08-19 — End: 2023-08-19

## 2023-08-19 MED ORDER — PALONOSETRON HCL INJECTION 0.25 MG/5ML
0.2500 mg | Freq: Once | INTRAVENOUS | Status: AC
  Administered 2023-08-19: 0.25 mg via INTRAVENOUS
  Filled 2023-08-19: qty 5

## 2023-08-19 MED ORDER — DEXAMETHASONE SODIUM PHOSPHATE 10 MG/ML IJ SOLN
10.0000 mg | Freq: Once | INTRAMUSCULAR | Status: AC
  Administered 2023-08-19: 10 mg via INTRAVENOUS
  Filled 2023-08-19: qty 1

## 2023-08-19 MED ORDER — SODIUM CHLORIDE 0.9 % IV SOLN
150.0000 mg | Freq: Once | INTRAVENOUS | Status: AC
  Administered 2023-08-19: 150 mg via INTRAVENOUS
  Filled 2023-08-19: qty 150

## 2023-08-19 MED ORDER — FAMOTIDINE IN NACL 20-0.9 MG/50ML-% IV SOLN
20.0000 mg | Freq: Once | INTRAVENOUS | Status: AC | PRN
  Administered 2023-08-19: 20 mg via INTRAVENOUS

## 2023-08-19 MED ORDER — IRINOTECAN HCL CHEMO INJECTION 100 MG/5ML
150.0000 mg/m2 | Freq: Once | INTRAVENOUS | Status: AC
  Administered 2023-08-19: 300 mg via INTRAVENOUS
  Filled 2023-08-19: qty 15

## 2023-08-19 NOTE — Progress Notes (Signed)
 Nutrition Assessment   Reason for Assessment: Pancreatic Cancer   ASSESSMENT: 78 year old female with stage IB pancreatic adenocarcinoma. She is receiving neoadjuvant Folfirinox q14d (start 2/12). Patient is under the care of Dr. Mosetta Putt.  Met with pt in infusion. She is eating a bag of chips at visit. Patient reports tolerating first therapy well with no side effects. She does report altered taste. Gravitating towards more salty foods for flavor. Patient is eating 3 times/day. Had grits with eggs for breakfast, snacked on peanut butter crackers in the afternoon. Recalls meatloaf, collards, pintos, cornbread for dinner. She is drinking 40-60 ounces of water.    Nutrition Focused Physical Exam: deferred   Medications: D3, pepcid, novolog, cozaar, zofran, klor-con, compazine   Labs: K .2, glucose 171   Anthropometrics: Pt reports intentional wt loss with dietary changes d/t elevated blood sugars  Height: 5'3.5" Weight: 164 lb UBW: 184 lb (August) BMI: 28.60  11% wt loss in 6 months - severe for time frame  NUTRITION DIAGNOSIS: Food and nutrition related knowledge deficit related to cancer as evidenced by no prior need for associated nutrition information   INTERVENTION:  Educated on strategies for taste changes, suggested trying baking soda salt water rinses before meals - handout with tips + recipe  Discussed importance of adequate calorie and protein energy intake to minimize loss of LBM Encouraged high protein snacks in between meals - handout with ideas  Discussed possible cold sensitivity side effects of oxaliplatin - handout provided Contact information given    MONITORING, EVALUATION, GOAL: Pt will tolerate increased calories and protein to minimize wt loss during treatment    Next Visit: Tuesday March 25 during infusion

## 2023-08-19 NOTE — Patient Instructions (Signed)
 [facility] - A DEPT OF [facility]  Discharge Instructions: Thank you for choosing [facility] to provide your oncology and hematology care.   If you have a lab appointment with the Cancer Center, please go directly to the Cancer Center and check in at the registration area.   Wear comfortable clothing and clothing appropriate for easy access to any Portacath or PICC line.   We strive to give you quality time with your provider. You may need to reschedule your appointment if you arrive late (15 or more minutes).  Arriving late affects you and other patients whose appointments are after yours.  Also, if you miss three or more appointments without notifying the office, you may be dismissed from the clinic at the provider's discretion.      For prescription refill requests, have your pharmacy contact our office and allow 72 hours for refills to be completed.    Today you received the following chemotherapy and/or immunotherapy agents: Oxaliplatin, Irinotecan, Leucovorin, Fluorouracil      To help prevent nausea and vomiting after your treatment, we encourage you to take your nausea medication as directed.  BELOW ARE SYMPTOMS THAT SHOULD BE REPORTED IMMEDIATELY: *FEVER GREATER THAN 100.4 F (38 C) OR HIGHER *CHILLS OR SWEATING *NAUSEA AND VOMITING THAT IS NOT CONTROLLED WITH YOUR NAUSEA MEDICATION *UNUSUAL SHORTNESS OF BREATH *UNUSUAL BRUISING OR BLEEDING *URINARY PROBLEMS (pain or burning when urinating, or frequent urination) *BOWEL PROBLEMS (unusual diarrhea, constipation, pain near the anus) TENDERNESS IN MOUTH AND THROAT WITH OR WITHOUT PRESENCE OF ULCERS (sore throat, sores in mouth, or a toothache) UNUSUAL RASH, SWELLING OR PAIN  UNUSUAL VAGINAL DISCHARGE OR ITCHING   Items with * indicate a potential emergency and should be followed up as soon as possible or go to the Emergency Department if any problems should occur.  Please show the  CHEMOTHERAPY ALERT CARD or IMMUNOTHERAPY ALERT CARD at check-in to the Emergency Department and triage nurse.  Should you have questions after your visit or need to cancel or reschedule your appointment, please contact [facility] - A DEPT OF [facility]  Dept: [phone]  and follow the prompts.  Office hours are 8:00 a.m. to 4:30 p.m. Monday - Friday. Please note that voicemails left after 4:00 p.m. may not be returned until the following business day.  We are closed weekends and major holidays. You have access to a nurse at all times for urgent questions. Please call the main number to the clinic Dept: [phone] and follow the prompts.   For any non-urgent questions, you may also contact your provider using MyChart. We now offer e-Visits for anyone 79 and older to request care online for non-urgent symptoms. For details visit [email or web address].   Also download the MyChart app! Go to the app store, search "MyChart", open the app, select , and log in with your MyChart username and password.  Oxaliplatin Injection What is this medication? OXALIPLATIN (ox AL i PLA tin) treats colorectal cancer. It works by slowing down the growth of cancer cells. This medicine may be used for other purposes; ask your health care provider or pharmacist if you have questions. COMMON BRAND NAME(S): Eloxatin What should I tell my care team before I take this medication? They need to know if you have any of these conditions: Heart disease History of irregular heartbeat or rhythm Liver disease Low blood cell levels (white cells, red cells,  and platelets) Lung or breathing disease, such as asthma Take medications that treat or prevent blood clots Tingling of the fingers, toes, or other nerve disorder An unusual or allergic reaction to oxaliplatin, other medications, foods, dyes, or preservatives If you or your partner are pregnant or trying to get  pregnant Breast-feeding How should I use this medication? This medication is injected into a vein. It is given by your care team in a hospital or clinic setting. Talk to your care team about the use of this medication in children. Special care may be needed. Overdosage: If you think you have taken too much of this medicine contact a poison control center or emergency room at once. NOTE: This medicine is only for you. Do not share this medicine with others. What if I miss a dose? Keep appointments for follow-up doses. It is important not to miss a dose. Call your care team if you are unable to keep an appointment. What may interact with this medication? Do not take this medication with any of the following: Cisapride Dronedarone Pimozide Thioridazine This medication may also interact with the following: Aspirin and aspirin-like medications Certain medications that treat or prevent blood clots, such as warfarin, apixaban, dabigatran, and rivaroxaban Cisplatin Cyclosporine Diuretics Medications for infection, such as acyclovir, adefovir, amphotericin B, bacitracin, cidofovir, foscarnet, ganciclovir, gentamicin, pentamidine, vancomycin NSAIDs, medications for pain and inflammation, such as ibuprofen or naproxen Other medications that cause heart rhythm changes Pamidronate Zoledronic acid This list may not describe all possible interactions. Give your health care provider a list of all the medicines, herbs, non-prescription drugs, or dietary supplements you use. Also tell them if you smoke, drink alcohol, or use illegal drugs. Some items may interact with your medicine. What should I watch for while using this medication? Your condition will be monitored carefully while you are receiving this medication. You may need blood work while taking this medication. This medication may make you feel generally unwell. This is not uncommon as chemotherapy can affect healthy cells as well as cancer  cells. Report any side effects. Continue your course of treatment even though you feel ill unless your care team tells you to stop. This medication may increase your risk of getting an infection. Call your care team for advice if you get a fever, chills, sore throat, or other symptoms of a cold or flu. Do not treat yourself. Try to avoid being around people who are sick. Avoid taking medications that contain aspirin, acetaminophen, ibuprofen, naproxen, or ketoprofen unless instructed by your care team. These medications may hide a fever. Be careful brushing or flossing your teeth or using a toothpick because you may get an infection or bleed more easily. If you have any dental work done, tell your dentist you are receiving this medication. This medication can make you more sensitive to cold. Do not drink cold drinks or use ice. Cover exposed skin before coming in contact with cold temperatures or cold objects. When out in cold weather wear warm clothing and cover your mouth and nose to warm the air that goes into your lungs. Tell your care team if you get sensitive to the cold. Talk to your care team if you or your partner are pregnant or think either of you might be pregnant. This medication can cause serious birth defects if taken during pregnancy and for 9 months after the last dose. A negative pregnancy test is required before starting this medication. A reliable form of contraception is recommended while taking this  medication and for 9 months after the last dose. Talk to your care team about effective forms of contraception. Do not father a child while taking this medication and for 6 months after the last dose. Use a condom while having sex during this time period. Do not breastfeed while taking this medication and for 3 months after the last dose. This medication may cause infertility. Talk to your care team if you are concerned about your fertility. What side effects may I notice from receiving this  medication? Side effects that you should report to your care team as soon as possible: Allergic reactions--skin rash, itching, hives, swelling of the face, lips, tongue, or throat Bleeding--bloody or black, tar-like stools, vomiting blood or brown material that looks like coffee grounds, red or dark brown urine, small red or purple spots on skin, unusual bruising or bleeding Dry cough, shortness of breath or trouble breathing Heart rhythm changes--fast or irregular heartbeat, dizziness, feeling faint or lightheaded, chest pain, trouble breathing Infection--fever, chills, cough, sore throat, wounds that don't heal, pain or trouble when passing urine, general feeling of discomfort or being unwell Liver injury--right upper belly pain, loss of appetite, nausea, light-colored stool, dark yellow or brown urine, yellowing skin or eyes, unusual weakness or fatigue Low red blood cell level--unusual weakness or fatigue, dizziness, headache, trouble breathing Muscle injury--unusual weakness or fatigue, muscle pain, dark yellow or brown urine, decrease in amount of urine Pain, tingling, or numbness in the hands or feet Sudden and severe headache, confusion, change in vision, seizures, which may be signs of posterior reversible encephalopathy syndrome (PRES) Unusual bruising or bleeding Side effects that usually do not require medical attention (report to your care team if they continue or are bothersome): Diarrhea Nausea Pain, redness, or swelling with sores inside the mouth or throat Unusual weakness or fatigue Vomiting This list may not describe all possible side effects. Call your doctor for medical advice about side effects. You may report side effects to FDA at 1-800-FDA-1088. Where should I keep my medication? This medication is given in a hospital or clinic. It will not be stored at home. NOTE: This sheet is a summary. It may not cover all possible information. If you have questions about this  medicine, talk to your doctor, pharmacist, or health care provider.  2024 Elsevier/Gold Standard (2023-05-23 00:00:00)  Irinotecan Injection What is this medication? IRINOTECAN (ir in oh TEE kan) treats some types of cancer. It works by slowing down the growth of cancer cells. This medicine may be used for other purposes; ask your health care provider or pharmacist if you have questions. COMMON BRAND NAME(S): Camptosar What should I tell my care team before I take this medication? They need to know if you have any of these conditions: Dehydration Diarrhea Infection, especially a viral infection, such as chickenpox, cold sores, herpes Liver disease Low blood cell levels (white cells, red cells, and platelets) Low levels of electrolytes, such as calcium, magnesium, or potassium in your blood Recent or ongoing radiation An unusual or allergic reaction to irinotecan, other medications, foods, dyes, or preservatives If you or your partner are pregnant or trying to get pregnant Breast-feeding How should I use this medication? This medication is injected into a vein. It is given by your care team in a hospital or clinic setting. Talk to your care team about the use of this medication in children. Special care may be needed. Overdosage: If you think you have taken too much of this medicine contact a  poison control center or emergency room at once. NOTE: This medicine is only for you. Do not share this medicine with others. What if I miss a dose? Keep appointments for follow-up doses. It is important not to miss your dose. Call your care team if you are unable to keep an appointment. What may interact with this medication? Do not take this medication with any of the following: Cobicistat Itraconazole This medication may also interact with the following: Certain antibiotics, such as clarithromycin, rifampin, rifabutin Certain antivirals for HIV or AIDS Certain medications for fungal  infections, such as ketoconazole, posaconazole, voriconazole Certain medications for seizures, such as carbamazepine, phenobarbital, phenytoin Gemfibrozil Nefazodone St. John's wort This list may not describe all possible interactions. Give your health care provider a list of all the medicines, herbs, non-prescription drugs, or dietary supplements you use. Also tell them if you smoke, drink alcohol, or use illegal drugs. Some items may interact with your medicine. What should I watch for while using this medication? Your condition will be monitored carefully while you are receiving this medication. You may need blood work while taking this medication. This medication may make you feel generally unwell. This is not uncommon as chemotherapy can affect healthy cells as well as cancer cells. Report any side effects. Continue your course of treatment even though you feel ill unless your care team tells you to stop. This medication can cause serious side effects. To reduce the risk, your care team may give you other medications to take before receiving this one. Be sure to follow the directions from your care team. This medication may affect your coordination, reaction time, or judgement. Do not drive or operate machinery until you know how this medication affects you. Sit up or stand slowly to reduce the risk of dizzy or fainting spells. Drinking alcohol with this medication can increase the risk of these side effects. This medication may increase your risk of getting an infection. Call your care team for advice if you get a fever, chills, sore throat, or other symptoms of a cold or flu. Do not treat yourself. Try to avoid being around people who are sick. Avoid taking medications that contain aspirin, acetaminophen, ibuprofen, naproxen, or ketoprofen unless instructed by your care team. These medications may hide a fever. This medication may increase your risk to bruise or bleed. Call your care team if you  notice any unusual bleeding. Be careful brushing or flossing your teeth or using a toothpick because you may get an infection or bleed more easily. If you have any dental work done, tell your dentist you are receiving this medication. Talk to your care team if you or your partner are pregnant or think either of you might be pregnant. This medication can cause serious birth defects if taken during pregnancy and for 6 months after the last dose. You will need a negative pregnancy test before starting this medication. Contraception is recommended while taking this medication and for 6 months after the last dose. Your care team can help you find the option that works for you. Do not father a child while taking this medication and for 3 months after the last dose. Use a condom for contraception during this time period. Do not breastfeed while taking this medication and for 7 days after the last dose. This medication may cause infertility. Talk to your care team if you are concerned about your fertility. What side effects may I notice from receiving this medication? Side effects that you should  report to your care team as soon as possible: Allergic reactions--skin rash, itching, hives, swelling of the face, lips, tongue, or throat Dry cough, shortness of breath or trouble breathing Increased saliva or tears, increased sweating, stomach cramping, diarrhea, small pupils, unusual weakness or fatigue, slow heartbeat Infection--fever, chills, cough, sore throat, wounds that don't heal, pain or trouble when passing urine, general feeling of discomfort or being unwell Kidney injury--decrease in the amount of urine, swelling of the ankles, hands, or feet Low red blood cell level--unusual weakness or fatigue, dizziness, headache, trouble breathing Severe or prolonged diarrhea Unusual bruising or bleeding Side effects that usually do not require medical attention (report to your care team if they continue or are  bothersome): Constipation Diarrhea Hair loss Loss of appetite Nausea Stomach pain This list may not describe all possible side effects. Call your doctor for medical advice about side effects. You may report side effects to FDA at 1-800-FDA-1088. Where should I keep my medication? This medication is given in a hospital or clinic. It will not be stored at home. NOTE: This sheet is a summary. It may not cover all possible information. If you have questions about this medicine, talk to your doctor, pharmacist, or health care provider.  2024 Elsevier/Gold Standard (2021-10-22 00:00:00)  Leucovorin Injection What is this medication? LEUCOVORIN (loo koe VOR in) prevents side effects from certain medications, such as methotrexate. It works by increasing folate levels. This helps protect healthy cells in your body. It may also be used to treat anemia caused by low levels of folate. It can also be used with fluorouracil, a type of chemotherapy, to treat colorectal cancer. It works by increasing the effects of fluorouracil in the body. This medicine may be used for other purposes; ask your health care provider or pharmacist if you have questions. What should I tell my care team before I take this medication? They need to know if you have any of these conditions: Anemia from low levels of vitamin B12 in the blood An unusual or allergic reaction to leucovorin, folic acid, other medications, foods, dyes, or preservatives Pregnant or trying to get pregnant Breastfeeding How should I use this medication? This medication is injected into a vein or a muscle. It is given by your care team in a hospital or clinic setting. Talk to your care team about the use of this medication in children. Special care may be needed. Overdosage: If you think you have taken too much of this medicine contact a poison control center or emergency room at once. NOTE: This medicine is only for you. Do not share this medicine with  others. What if I miss a dose? Keep appointments for follow-up doses. It is important not to miss your dose. Call your care team if you are unable to keep an appointment. What may interact with this medication? Capecitabine Fluorouracil Phenobarbital Phenytoin Primidone Trimethoprim;sulfamethoxazole This list may not describe all possible interactions. Give your health care provider a list of all the medicines, herbs, non-prescription drugs, or dietary supplements you use. Also tell them if you smoke, drink alcohol, or use illegal drugs. Some items may interact with your medicine. What should I watch for while using this medication? Your condition will be monitored carefully while you are receiving this medication. This medication may increase the side effects of 5-fluorouracil. Tell your care team if you have diarrhea or mouth sores that do not get better or that get worse. What side effects may I notice from receiving this medication?  Side effects that you should report to your care team as soon as possible: Allergic reactions--skin rash, itching, hives, swelling of the face, lips, tongue, or throat This list may not describe all possible side effects. Call your doctor for medical advice about side effects. You may report side effects to FDA at 1-800-FDA-1088. Where should I keep my medication? This medication is given in a hospital or clinic. It will not be stored at home. NOTE: This sheet is a summary. It may not cover all possible information. If you have questions about this medicine, talk to your doctor, pharmacist, or health care provider.  2024 Elsevier/Gold Standard (2021-11-13 00:00:00)  Fluorouracil Injection What is this medication? FLUOROURACIL (flure oh YOOR a sil) treats some types of cancer. It works by slowing down the growth of cancer cells. This medicine may be used for other purposes; ask your health care provider or pharmacist if you have questions. COMMON BRAND  NAME(S): Adrucil What should I tell my care team before I take this medication? They need to know if you have any of these conditions: Blood disorders Dihydropyrimidine dehydrogenase (DPD) deficiency Infection, such as chickenpox, cold sores, herpes Kidney disease Liver disease Poor nutrition Recent or ongoing radiation therapy An unusual or allergic reaction to fluorouracil, other medications, foods, dyes, or preservatives If you or your partner are pregnant or trying to get pregnant Breast-feeding How should I use this medication? This medication is injected into a vein. It is administered by your care team in a hospital or clinic setting. Talk to your care team about the use of this medication in children. Special care may be needed. Overdosage: If you think you have taken too much of this medicine contact a poison control center or emergency room at once. NOTE: This medicine is only for you. Do not share this medicine with others. What if I miss a dose? Keep appointments for follow-up doses. It is important not to miss your dose. Call your care team if you are unable to keep an appointment. What may interact with this medication? Do not take this medication with any of the following: Live virus vaccines This medication may also interact with the following: Medications that treat or prevent blood clots, such as warfarin, enoxaparin, dalteparin This list may not describe all possible interactions. Give your health care provider a list of all the medicines, herbs, non-prescription drugs, or dietary supplements you use. Also tell them if you smoke, drink alcohol, or use illegal drugs. Some items may interact with your medicine. What should I watch for while using this medication? Your condition will be monitored carefully while you are receiving this medication. This medication may make you feel generally unwell. This is not uncommon as chemotherapy can affect healthy cells as well as  cancer cells. Report any side effects. Continue your course of treatment even though you feel ill unless your care team tells you to stop. In some cases, you may be given additional medications to help with side effects. Follow all directions for their use. This medication may increase your risk of getting an infection. Call your care team for advice if you get a fever, chills, sore throat, or other symptoms of a cold or flu. Do not treat yourself. Try to avoid being around people who are sick. This medication may increase your risk to bruise or bleed. Call your care team if you notice any unusual bleeding. Be careful brushing or flossing your teeth or using a toothpick because you may get an  infection or bleed more easily. If you have any dental work done, tell your dentist you are receiving this medication. Avoid taking medications that contain aspirin, acetaminophen, ibuprofen, naproxen, or ketoprofen unless instructed by your care team. These medications may hide a fever. Do not treat diarrhea with over the counter products. Contact your care team if you have diarrhea that lasts more than 2 days or if it is severe and watery. This medication can make you more sensitive to the sun. Keep out of the sun. If you cannot avoid being in the sun, wear protective clothing and sunscreen. Do not use sun lamps, tanning beds, or tanning booths. Talk to your care team if you or your partner wish to become pregnant or think you might be pregnant. This medication can cause serious birth defects if taken during pregnancy and for 3 months after the last dose. A reliable form of contraception is recommended while taking this medication and for 3 months after the last dose. Talk to your care team about effective forms of contraception. Do not father a child while taking this medication and for 3 months after the last dose. Use a condom while having sex during this time period. Do not breastfeed while taking this  medication. This medication may cause infertility. Talk to your care team if you are concerned about your fertility. What side effects may I notice from receiving this medication? Side effects that you should report to your care team as soon as possible: Allergic reactions--skin rash, itching, hives, swelling of the face, lips, tongue, or throat Heart attack--pain or tightness in the chest, shoulders, arms, or jaw, nausea, shortness of breath, cold or clammy skin, feeling faint or lightheaded Heart failure--shortness of breath, swelling of the ankles, feet, or hands, sudden weight gain, unusual weakness or fatigue Heart rhythm changes--fast or irregular heartbeat, dizziness, feeling faint or lightheaded, chest pain, trouble breathing High ammonia level--unusual weakness or fatigue, confusion, loss of appetite, nausea, vomiting, seizures Infection--fever, chills, cough, sore throat, wounds that don't heal, pain or trouble when passing urine, general feeling of discomfort or being unwell Low red blood cell level--unusual weakness or fatigue, dizziness, headache, trouble breathing Pain, tingling, or numbness in the hands or feet, muscle weakness, change in vision, confusion or trouble speaking, loss of balance or coordination, trouble walking, seizures Redness, swelling, and blistering of the skin over hands and feet Severe or prolonged diarrhea Unusual bruising or bleeding Side effects that usually do not require medical attention (report to your care team if they continue or are bothersome): Dry skin Headache Increased tears Nausea Pain, redness, or swelling with sores inside the mouth or throat Sensitivity to light Vomiting This list may not describe all possible side effects. Call your doctor for medical advice about side effects. You may report side effects to FDA at 1-800-FDA-1088. Where should I keep my medication? This medication is given in a hospital or clinic. It will not be stored  at home. NOTE: This sheet is a summary. It may not cover all possible information. If you have questions about this medicine, talk to your doctor, pharmacist, or health care provider.  2024 Elsevier/Gold Standard (2021-10-16 00:00:00)

## 2023-08-19 NOTE — Progress Notes (Signed)
 Hypersensitivity Reaction note  Date of event: 08/19/23 Time of event: 1342 Generic name of drug involved: Irinotecan and Leucovorin (following oxaliplatin) Name of provider notified of the hypersensitivity reaction: Santiago Glad, NP; Dr. Mosetta Putt Was agent that likely caused hypersensitivity reaction added to Allergies List within EMR? (Pending) Chain of events including reaction signs/symptoms, treatment administered, and outcome (e.g., drug resumed; drug discontinued; sent to Emergency Department; etc.)  1342: 30 min into irinotecan and leucovorin infusion, patient c/o difficulty breathing, and feeling like her throat was closing up.  Patient leaning forward, noticeably dyspneic.  Also c/o hand cramping and "not feeling right."  Infusion stopped, Santiago Glad, NP, en route.  1345: NP at chairside.  IVF hung to gravity; IV Pepcid hung to gravity (see MAR for details).  See Flowsheets for VS.  1350: Patient reported minimal relief of symptoms.  Per NP, IV Solu-Medrol administered (see MAR).  1351: Patient reported feeling moderately better.  Per NP, IVF continued to gravity.  1402: Patient noticeably uncomfortable, c/o "tight feeling" when she swallows.  Per NP, IV Benadryl administered (see MAR).  1421: Patient reported feeling "80% better."  MD notified.  1446: Per MD, irinotecan and leucovorin restarted at half rate.   Patient reported relief of symptoms.  1501: Patient tolerated medication rechallenge without issue.  See Flowsheets for VS.  Per MD, medication increased to original rate.  Patient tolerated remainder of treatment without incident.  MD notified; premedications added to future treatments.  Bradd Burner, RN 08/19/2023 2:48 PM

## 2023-08-19 NOTE — Progress Notes (Signed)
 Per Dr. Mosetta Putt, rechallenge at 1/2 rate for if everything is good after pt should be good to go to full rate.

## 2023-08-19 NOTE — Progress Notes (Signed)
 Called to infusion room to assess patient for infusion reaction. She had completed C2 D1 oxaliplatin and had irinotecan/leucovorin infusing. She ambulated to bathroom, upon return to her seat she started breathing heavily with some difficulty and shortness of breath. She felt her throat was dry and a little tight. Chemo was stopped, vital signs were obtained, and NS started to gravity. She was given hypersensitivity medication including pepcid, solumedrol, and Benadryl (25 mg IV). Lungs clear on exam, she remained AAO x4 and hemodynamically stable on room air for the duration. Symptoms improved immediately.   Unclear if this was a reaction to Emend, Oxaliplatin (delayed) or Irinotecan/leucovorin which were infusing with symptom onset. She will likely benefit from additional premeds with subsequent chemo. I handed off to Vincent Gros, NP and Dr. Mosetta Putt for further care.    Santiago Glad, NP

## 2023-08-21 ENCOUNTER — Inpatient Hospital Stay: Payer: PPO

## 2023-08-21 ENCOUNTER — Telehealth: Payer: Self-pay

## 2023-08-21 ENCOUNTER — Ambulatory Visit (INDEPENDENT_AMBULATORY_CARE_PROVIDER_SITE_OTHER): Payer: PPO | Admitting: Otolaryngology

## 2023-08-21 VITALS — BP 138/73 | HR 67 | Temp 99.0°F | Resp 16

## 2023-08-21 DIAGNOSIS — Z95828 Presence of other vascular implants and grafts: Secondary | ICD-10-CM

## 2023-08-21 MED ORDER — HEPARIN SOD (PORK) LOCK FLUSH 100 UNIT/ML IV SOLN: 500.0000 [IU] | Freq: Once | INTRAVENOUS | Status: DC

## 2023-08-21 MED ORDER — SODIUM CHLORIDE 0.9% FLUSH: 10.0000 mL | Freq: Once | INTRAVENOUS | Status: DC

## 2023-08-21 NOTE — Telephone Encounter (Signed)
 Received a telephone call from patient requesting pathology reports. Patient stated she needs her pathology reports for [employer]  Contacted patient by telephone.  Reached patient's daughter, Kelly Splinter. Let patient know that pathology reports have been printed and let patient know that she can pick-up documents from the front desk receptionist at her convenience.  Daughter verbalized understanding.

## 2023-08-22 ENCOUNTER — Encounter: Payer: Self-pay | Admitting: Hematology

## 2023-08-28 ENCOUNTER — Other Ambulatory Visit: Payer: Self-pay

## 2023-08-28 ENCOUNTER — Other Ambulatory Visit: Payer: Self-pay | Admitting: Internal Medicine

## 2023-08-28 DIAGNOSIS — I10 Essential (primary) hypertension: Secondary | ICD-10-CM

## 2023-09-01 ENCOUNTER — Other Ambulatory Visit: Payer: Self-pay | Admitting: Internal Medicine

## 2023-09-01 MED ORDER — ONETOUCH DELICA PLUS LANCET33G MISC: 1.0000 | Freq: Three times a day (TID) | 0 refills | Status: DC

## 2023-09-01 MED FILL — Fosaprepitant Dimeglumine For IV Infusion 150 MG (Base Eq): INTRAVENOUS | Qty: 5 | Status: AC

## 2023-09-01 NOTE — Telephone Encounter (Signed)
 Copied from CRM [id number]. Topic: Clinical - Medication Refill >> Sep 01, 2023 10:39 AM Benetta Spar B wrote: Most Recent Primary Care Visit:  Provider: Anabel Halon  Department: [facility] CARE  Visit Type: OFFICE VISIT  Date: 07/31/2023  Medication: Lancets (ONETOUCH DELICA PLUS LANCET33G) MISC  Has the patient contacted their pharmacy? yes (Agent: If yes, when and what did the pharmacy advise?)  Is this the correct pharmacy for this prescription? yes If no, delete pharmacy and type the correct one.  [facility] [address], [place] - [address] [place] [address] Phone: [phone] Fax: [phone]    Has the prescription been filled recently? no  Is the patient out of the medication? yes  Has the patient been seen for an appointment in the last year OR does the patient have an upcoming appointment? yes  Can we respond through MyChart? yes  Agent: Please be advised that Rx refills may take up to 3 business days. We ask that you follow-up with your pharmacy.

## 2023-09-02 ENCOUNTER — Inpatient Hospital Stay: Payer: PPO | Attending: Nurse Practitioner

## 2023-09-02 ENCOUNTER — Inpatient Hospital Stay: Payer: PPO | Attending: Nurse Practitioner | Admitting: Hematology

## 2023-09-02 ENCOUNTER — Encounter: Payer: Self-pay | Admitting: Hematology

## 2023-09-02 ENCOUNTER — Inpatient Hospital Stay: Payer: PPO

## 2023-09-02 VITALS — BP 156/82 | HR 56 | Temp 97.4°F | Resp 22 | Ht 63.5 in | Wt 161.2 lb

## 2023-09-02 DIAGNOSIS — C259 Malignant neoplasm of pancreas, unspecified: Secondary | ICD-10-CM

## 2023-09-02 DIAGNOSIS — C250 Malignant neoplasm of head of pancreas: Secondary | ICD-10-CM | POA: Diagnosis not present

## 2023-09-02 DIAGNOSIS — Z79630 Long term (current) use of alkylating agent: Secondary | ICD-10-CM | POA: Insufficient documentation

## 2023-09-02 DIAGNOSIS — Z79634 Long term (current) use of topoisomerase inhibitor: Secondary | ICD-10-CM | POA: Insufficient documentation

## 2023-09-02 DIAGNOSIS — Z5189 Encounter for other specified aftercare: Secondary | ICD-10-CM | POA: Diagnosis not present

## 2023-09-02 DIAGNOSIS — Z5111 Encounter for antineoplastic chemotherapy: Secondary | ICD-10-CM | POA: Insufficient documentation

## 2023-09-02 DIAGNOSIS — Z79631 Long term (current) use of antimetabolite agent: Secondary | ICD-10-CM | POA: Diagnosis not present

## 2023-09-02 DIAGNOSIS — Z79899 Other long term (current) drug therapy: Secondary | ICD-10-CM | POA: Diagnosis not present

## 2023-09-02 DIAGNOSIS — Z95828 Presence of other vascular implants and grafts: Secondary | ICD-10-CM

## 2023-09-02 LAB — CMP (CANCER CENTER ONLY)
ALT: 57 U/L — ABNORMAL HIGH (ref 0–44)
AST: 32 U/L (ref 15–41)
Albumin: 3.6 g/dL (ref 3.5–5.0)
Alkaline Phosphatase: 414 U/L — ABNORMAL HIGH (ref 38–126)
Anion gap: 8 (ref 5–15)
BUN: 10 mg/dL (ref 8–23)
CO2: 27 mmol/L (ref 22–32)
Calcium: 8.4 mg/dL — ABNORMAL LOW (ref 8.9–10.3)
Chloride: 106 mmol/L (ref 98–111)
Creatinine: 0.87 mg/dL (ref 0.44–1.00)
GFR, Estimated: >60 mL/min (ref >60)
Glucose, Bld: 149 mg/dL — ABNORMAL HIGH (ref 70–99)
Potassium: 3.6 mmol/L (ref 3.5–5.1)
Sodium: 141 mmol/L (ref 135–145)
Total Bilirubin: 0.5 mg/dL (ref 0.0–1.2)
Total Protein: 6.6 g/dL (ref 6.5–8.1)

## 2023-09-02 LAB — CBC WITH DIFFERENTIAL (CANCER CENTER ONLY)
Abs Immature Granulocytes: 0.02 10*3/uL (ref 0.00–0.07)
Basophils Absolute: 0 10*3/uL (ref 0.0–0.1)
Basophils Relative: 1 %
Eosinophils Absolute: 0.1 10*3/uL (ref 0.0–0.5)
Eosinophils Relative: 2 %
HCT: 30 % — ABNORMAL LOW (ref 36.0–46.0)
Hemoglobin: 10 g/dL — ABNORMAL LOW (ref 12.0–15.0)
Immature Granulocytes: 1 %
Lymphocytes Relative: 57 %
Lymphs Abs: 2.3 10*3/uL (ref 0.7–4.0)
MCH: 30.1 pg (ref 26.0–34.0)
MCHC: 33.3 g/dL (ref 30.0–36.0)
MCV: 90.4 fL (ref 80.0–100.0)
Monocytes Absolute: 0.5 10*3/uL (ref 0.1–1.0)
Monocytes Relative: 13 %
Neutro Abs: 1.1 10*3/uL — ABNORMAL LOW (ref 1.7–7.7)
Neutrophils Relative %: 26 %
Platelet Count: 357 10*3/uL (ref 150–400)
RBC: 3.32 MIL/uL — ABNORMAL LOW (ref 3.87–5.11)
RDW: 13.5 % (ref 11.5–15.5)
WBC Count: 4.1 10*3/uL (ref 4.0–10.5)
nRBC: 0 % (ref 0.0–0.2)

## 2023-09-02 MED ORDER — FAMOTIDINE IN NACL 20-0.9 MG/50ML-% IV SOLN
20.0000 mg | Freq: Once | INTRAVENOUS | Status: AC
  Administered 2023-09-02: 20 mg via INTRAVENOUS
  Filled 2023-09-02: qty 50

## 2023-09-02 MED ORDER — SODIUM CHLORIDE 0.9 % IV SOLN
150.0000 mg/m2 | Freq: Once | INTRAVENOUS | Status: AC
  Administered 2023-09-02: 300 mg via INTRAVENOUS
  Filled 2023-09-02: qty 15

## 2023-09-02 MED ORDER — SODIUM CHLORIDE 0.9% FLUSH
10.0000 mL | Freq: Once | INTRAVENOUS | Status: AC
  Administered 2023-09-02: 10 mL

## 2023-09-02 MED ORDER — ATROPINE SULFATE 1 MG/ML IV SOLN
0.5000 mg | Freq: Once | INTRAVENOUS | Status: AC | PRN
  Administered 2023-09-02: 0.5 mg via INTRAVENOUS
  Filled 2023-09-02: qty 1

## 2023-09-02 MED ORDER — OXALIPLATIN CHEMO INJECTION 100 MG/20ML
85.0000 mg/m2 | Freq: Once | INTRAVENOUS | Status: AC
  Administered 2023-09-02: 150 mg via INTRAVENOUS
  Filled 2023-09-02: qty 10

## 2023-09-02 MED ORDER — SODIUM CHLORIDE 0.9% FLUSH: 10.0000 mL | INTRAVENOUS | Status: DC | PRN

## 2023-09-02 MED ORDER — DIPHENHYDRAMINE HCL 50 MG/ML IJ SOLN
50.0000 mg | Freq: Once | INTRAMUSCULAR | Status: AC
  Administered 2023-09-02: 50 mg via INTRAVENOUS
  Filled 2023-09-02: qty 1

## 2023-09-02 MED ORDER — DEXAMETHASONE SODIUM PHOSPHATE 10 MG/ML IJ SOLN
10.0000 mg | Freq: Once | INTRAMUSCULAR | Status: AC
  Administered 2023-09-02: 10 mg via INTRAVENOUS
  Filled 2023-09-02: qty 1

## 2023-09-02 MED ORDER — SODIUM CHLORIDE 0.9 % IV SOLN
150.0000 mg | Freq: Once | INTRAVENOUS | Status: AC
  Administered 2023-09-02: 150 mg via INTRAVENOUS
  Filled 2023-09-02: qty 150

## 2023-09-02 MED ORDER — HEPARIN SOD (PORK) LOCK FLUSH 100 UNIT/ML IV SOLN: 500.0000 [IU] | Freq: Once | INTRAVENOUS | Status: DC | PRN

## 2023-09-02 MED ORDER — DEXTROSE 5 % IV SOLN: INTRAVENOUS | Status: DC

## 2023-09-02 MED ORDER — SODIUM CHLORIDE 0.9 % IV SOLN
400.0000 mg/m2 | Freq: Once | INTRAVENOUS | Status: AC
  Administered 2023-09-02: 728 mg via INTRAVENOUS
  Filled 2023-09-02: qty 17.5

## 2023-09-02 MED ORDER — PALONOSETRON HCL INJECTION 0.25 MG/5ML
0.2500 mg | Freq: Once | INTRAVENOUS | Status: AC
  Administered 2023-09-02: 0.25 mg via INTRAVENOUS
  Filled 2023-09-02: qty 5

## 2023-09-02 MED ORDER — SODIUM CHLORIDE 0.9 % IV SOLN
2400.0000 mg/m2 | INTRAVENOUS | Status: DC
  Administered 2023-09-02: 4350 mg via INTRAVENOUS
  Filled 2023-09-02: qty 87

## 2023-09-02 NOTE — Patient Instructions (Signed)
 [facility] - A DEPT OF [facility]  Discharge Instructions: Thank you for choosing [facility] to provide your oncology and hematology care.   If you have a lab appointment with the Cancer Center, please go directly to the Cancer Center and check in at the registration area.   Wear comfortable clothing and clothing appropriate for easy access to any Portacath or PICC line.   We strive to give you quality time with your provider. You may need to reschedule your appointment if you arrive late (15 or more minutes).  Arriving late affects you and other patients whose appointments are after yours.  Also, if you miss three or more appointments without notifying the office, you may be dismissed from the clinic at the provider's discretion.      For prescription refill requests, have your pharmacy contact our office and allow 72 hours for refills to be completed.    Today you received the following chemotherapy and/or immunotherapy agents: Oxaliplatin, Irinotecan, Leucovorin, Fluorouracil      To help prevent nausea and vomiting after your treatment, we encourage you to take your nausea medication as directed.  BELOW ARE SYMPTOMS THAT SHOULD BE REPORTED IMMEDIATELY: *FEVER GREATER THAN 100.4 F (38 C) OR HIGHER *CHILLS OR SWEATING *NAUSEA AND VOMITING THAT IS NOT CONTROLLED WITH YOUR NAUSEA MEDICATION *UNUSUAL SHORTNESS OF BREATH *UNUSUAL BRUISING OR BLEEDING *URINARY PROBLEMS (pain or burning when urinating, or frequent urination) *BOWEL PROBLEMS (unusual diarrhea, constipation, pain near the anus) TENDERNESS IN MOUTH AND THROAT WITH OR WITHOUT PRESENCE OF ULCERS (sore throat, sores in mouth, or a toothache) UNUSUAL RASH, SWELLING OR PAIN  UNUSUAL VAGINAL DISCHARGE OR ITCHING   Items with * indicate a potential emergency and should be followed up as soon as possible or go to the Emergency Department if any problems should occur.  Please show the  CHEMOTHERAPY ALERT CARD or IMMUNOTHERAPY ALERT CARD at check-in to the Emergency Department and triage nurse.  Should you have questions after your visit or need to cancel or reschedule your appointment, please contact [facility] - A DEPT OF [facility]  Dept: [phone]  and follow the prompts.  Office hours are 8:00 a.m. to 4:30 p.m. Monday - Friday. Please note that voicemails left after 4:00 p.m. may not be returned until the following business day.  We are closed weekends and major holidays. You have access to a nurse at all times for urgent questions. Please call the main number to the clinic Dept: [phone] and follow the prompts.   For any non-urgent questions, you may also contact your provider using MyChart. We now offer e-Visits for anyone 79 and older to request care online for non-urgent symptoms. For details visit [email or web address].   Also download the MyChart app! Go to the app store, search "MyChart", open the app, select , and log in with your MyChart username and password.  Oxaliplatin Injection What is this medication? OXALIPLATIN (ox AL i PLA tin) treats colorectal cancer. It works by slowing down the growth of cancer cells. This medicine may be used for other purposes; ask your health care provider or pharmacist if you have questions. COMMON BRAND NAME(S): Eloxatin What should I tell my care team before I take this medication? They need to know if you have any of these conditions: Heart disease History of irregular heartbeat or rhythm Liver disease Low blood cell levels (white cells, red cells,  and platelets) Lung or breathing disease, such as asthma Take medications that treat or prevent blood clots Tingling of the fingers, toes, or other nerve disorder An unusual or allergic reaction to oxaliplatin, other medications, foods, dyes, or preservatives If you or your partner are pregnant or trying to get  pregnant Breast-feeding How should I use this medication? This medication is injected into a vein. It is given by your care team in a hospital or clinic setting. Talk to your care team about the use of this medication in children. Special care may be needed. Overdosage: If you think you have taken too much of this medicine contact a poison control center or emergency room at once. NOTE: This medicine is only for you. Do not share this medicine with others. What if I miss a dose? Keep appointments for follow-up doses. It is important not to miss a dose. Call your care team if you are unable to keep an appointment. What may interact with this medication? Do not take this medication with any of the following: Cisapride Dronedarone Pimozide Thioridazine This medication may also interact with the following: Aspirin and aspirin-like medications Certain medications that treat or prevent blood clots, such as warfarin, apixaban, dabigatran, and rivaroxaban Cisplatin Cyclosporine Diuretics Medications for infection, such as acyclovir, adefovir, amphotericin B, bacitracin, cidofovir, foscarnet, ganciclovir, gentamicin, pentamidine, vancomycin NSAIDs, medications for pain and inflammation, such as ibuprofen or naproxen Other medications that cause heart rhythm changes Pamidronate Zoledronic acid This list may not describe all possible interactions. Give your health care provider a list of all the medicines, herbs, non-prescription drugs, or dietary supplements you use. Also tell them if you smoke, drink alcohol, or use illegal drugs. Some items may interact with your medicine. What should I watch for while using this medication? Your condition will be monitored carefully while you are receiving this medication. You may need blood work while taking this medication. This medication may make you feel generally unwell. This is not uncommon as chemotherapy can affect healthy cells as well as cancer  cells. Report any side effects. Continue your course of treatment even though you feel ill unless your care team tells you to stop. This medication may increase your risk of getting an infection. Call your care team for advice if you get a fever, chills, sore throat, or other symptoms of a cold or flu. Do not treat yourself. Try to avoid being around people who are sick. Avoid taking medications that contain aspirin, acetaminophen, ibuprofen, naproxen, or ketoprofen unless instructed by your care team. These medications may hide a fever. Be careful brushing or flossing your teeth or using a toothpick because you may get an infection or bleed more easily. If you have any dental work done, tell your dentist you are receiving this medication. This medication can make you more sensitive to cold. Do not drink cold drinks or use ice. Cover exposed skin before coming in contact with cold temperatures or cold objects. When out in cold weather wear warm clothing and cover your mouth and nose to warm the air that goes into your lungs. Tell your care team if you get sensitive to the cold. Talk to your care team if you or your partner are pregnant or think either of you might be pregnant. This medication can cause serious birth defects if taken during pregnancy and for 9 months after the last dose. A negative pregnancy test is required before starting this medication. A reliable form of contraception is recommended while taking this  medication and for 9 months after the last dose. Talk to your care team about effective forms of contraception. Do not father a child while taking this medication and for 6 months after the last dose. Use a condom while having sex during this time period. Do not breastfeed while taking this medication and for 3 months after the last dose. This medication may cause infertility. Talk to your care team if you are concerned about your fertility. What side effects may I notice from receiving this  medication? Side effects that you should report to your care team as soon as possible: Allergic reactions--skin rash, itching, hives, swelling of the face, lips, tongue, or throat Bleeding--bloody or black, tar-like stools, vomiting blood or brown material that looks like coffee grounds, red or dark brown urine, small red or purple spots on skin, unusual bruising or bleeding Dry cough, shortness of breath or trouble breathing Heart rhythm changes--fast or irregular heartbeat, dizziness, feeling faint or lightheaded, chest pain, trouble breathing Infection--fever, chills, cough, sore throat, wounds that don't heal, pain or trouble when passing urine, general feeling of discomfort or being unwell Liver injury--right upper belly pain, loss of appetite, nausea, light-colored stool, dark yellow or brown urine, yellowing skin or eyes, unusual weakness or fatigue Low red blood cell level--unusual weakness or fatigue, dizziness, headache, trouble breathing Muscle injury--unusual weakness or fatigue, muscle pain, dark yellow or brown urine, decrease in amount of urine Pain, tingling, or numbness in the hands or feet Sudden and severe headache, confusion, change in vision, seizures, which may be signs of posterior reversible encephalopathy syndrome (PRES) Unusual bruising or bleeding Side effects that usually do not require medical attention (report to your care team if they continue or are bothersome): Diarrhea Nausea Pain, redness, or swelling with sores inside the mouth or throat Unusual weakness or fatigue Vomiting This list may not describe all possible side effects. Call your doctor for medical advice about side effects. You may report side effects to FDA at 1-800-FDA-1088. Where should I keep my medication? This medication is given in a hospital or clinic. It will not be stored at home. NOTE: This sheet is a summary. It may not cover all possible information. If you have questions about this  medicine, talk to your doctor, pharmacist, or health care provider.  2024 Elsevier/Gold Standard (2023-05-23 00:00:00)  Irinotecan Injection What is this medication? IRINOTECAN (ir in oh TEE kan) treats some types of cancer. It works by slowing down the growth of cancer cells. This medicine may be used for other purposes; ask your health care provider or pharmacist if you have questions. COMMON BRAND NAME(S): Camptosar What should I tell my care team before I take this medication? They need to know if you have any of these conditions: Dehydration Diarrhea Infection, especially a viral infection, such as chickenpox, cold sores, herpes Liver disease Low blood cell levels (white cells, red cells, and platelets) Low levels of electrolytes, such as calcium, magnesium, or potassium in your blood Recent or ongoing radiation An unusual or allergic reaction to irinotecan, other medications, foods, dyes, or preservatives If you or your partner are pregnant or trying to get pregnant Breast-feeding How should I use this medication? This medication is injected into a vein. It is given by your care team in a hospital or clinic setting. Talk to your care team about the use of this medication in children. Special care may be needed. Overdosage: If you think you have taken too much of this medicine contact a  poison control center or emergency room at once. NOTE: This medicine is only for you. Do not share this medicine with others. What if I miss a dose? Keep appointments for follow-up doses. It is important not to miss your dose. Call your care team if you are unable to keep an appointment. What may interact with this medication? Do not take this medication with any of the following: Cobicistat Itraconazole This medication may also interact with the following: Certain antibiotics, such as clarithromycin, rifampin, rifabutin Certain antivirals for HIV or AIDS Certain medications for fungal  infections, such as ketoconazole, posaconazole, voriconazole Certain medications for seizures, such as carbamazepine, phenobarbital, phenytoin Gemfibrozil Nefazodone St. John's wort This list may not describe all possible interactions. Give your health care provider a list of all the medicines, herbs, non-prescription drugs, or dietary supplements you use. Also tell them if you smoke, drink alcohol, or use illegal drugs. Some items may interact with your medicine. What should I watch for while using this medication? Your condition will be monitored carefully while you are receiving this medication. You may need blood work while taking this medication. This medication may make you feel generally unwell. This is not uncommon as chemotherapy can affect healthy cells as well as cancer cells. Report any side effects. Continue your course of treatment even though you feel ill unless your care team tells you to stop. This medication can cause serious side effects. To reduce the risk, your care team may give you other medications to take before receiving this one. Be sure to follow the directions from your care team. This medication may affect your coordination, reaction time, or judgement. Do not drive or operate machinery until you know how this medication affects you. Sit up or stand slowly to reduce the risk of dizzy or fainting spells. Drinking alcohol with this medication can increase the risk of these side effects. This medication may increase your risk of getting an infection. Call your care team for advice if you get a fever, chills, sore throat, or other symptoms of a cold or flu. Do not treat yourself. Try to avoid being around people who are sick. Avoid taking medications that contain aspirin, acetaminophen, ibuprofen, naproxen, or ketoprofen unless instructed by your care team. These medications may hide a fever. This medication may increase your risk to bruise or bleed. Call your care team if you  notice any unusual bleeding. Be careful brushing or flossing your teeth or using a toothpick because you may get an infection or bleed more easily. If you have any dental work done, tell your dentist you are receiving this medication. Talk to your care team if you or your partner are pregnant or think either of you might be pregnant. This medication can cause serious birth defects if taken during pregnancy and for 6 months after the last dose. You will need a negative pregnancy test before starting this medication. Contraception is recommended while taking this medication and for 6 months after the last dose. Your care team can help you find the option that works for you. Do not father a child while taking this medication and for 3 months after the last dose. Use a condom for contraception during this time period. Do not breastfeed while taking this medication and for 7 days after the last dose. This medication may cause infertility. Talk to your care team if you are concerned about your fertility. What side effects may I notice from receiving this medication? Side effects that you should  report to your care team as soon as possible: Allergic reactions--skin rash, itching, hives, swelling of the face, lips, tongue, or throat Dry cough, shortness of breath or trouble breathing Increased saliva or tears, increased sweating, stomach cramping, diarrhea, small pupils, unusual weakness or fatigue, slow heartbeat Infection--fever, chills, cough, sore throat, wounds that don't heal, pain or trouble when passing urine, general feeling of discomfort or being unwell Kidney injury--decrease in the amount of urine, swelling of the ankles, hands, or feet Low red blood cell level--unusual weakness or fatigue, dizziness, headache, trouble breathing Severe or prolonged diarrhea Unusual bruising or bleeding Side effects that usually do not require medical attention (report to your care team if they continue or are  bothersome): Constipation Diarrhea Hair loss Loss of appetite Nausea Stomach pain This list may not describe all possible side effects. Call your doctor for medical advice about side effects. You may report side effects to FDA at 1-800-FDA-1088. Where should I keep my medication? This medication is given in a hospital or clinic. It will not be stored at home. NOTE: This sheet is a summary. It may not cover all possible information. If you have questions about this medicine, talk to your doctor, pharmacist, or health care provider.  2024 Elsevier/Gold Standard (2021-10-22 00:00:00)  Leucovorin Injection What is this medication? LEUCOVORIN (loo koe VOR in) prevents side effects from certain medications, such as methotrexate. It works by increasing folate levels. This helps protect healthy cells in your body. It may also be used to treat anemia caused by low levels of folate. It can also be used with fluorouracil, a type of chemotherapy, to treat colorectal cancer. It works by increasing the effects of fluorouracil in the body. This medicine may be used for other purposes; ask your health care provider or pharmacist if you have questions. What should I tell my care team before I take this medication? They need to know if you have any of these conditions: Anemia from low levels of vitamin B12 in the blood An unusual or allergic reaction to leucovorin, folic acid, other medications, foods, dyes, or preservatives Pregnant or trying to get pregnant Breastfeeding How should I use this medication? This medication is injected into a vein or a muscle. It is given by your care team in a hospital or clinic setting. Talk to your care team about the use of this medication in children. Special care may be needed. Overdosage: If you think you have taken too much of this medicine contact a poison control center or emergency room at once. NOTE: This medicine is only for you. Do not share this medicine with  others. What if I miss a dose? Keep appointments for follow-up doses. It is important not to miss your dose. Call your care team if you are unable to keep an appointment. What may interact with this medication? Capecitabine Fluorouracil Phenobarbital Phenytoin Primidone Trimethoprim;sulfamethoxazole This list may not describe all possible interactions. Give your health care provider a list of all the medicines, herbs, non-prescription drugs, or dietary supplements you use. Also tell them if you smoke, drink alcohol, or use illegal drugs. Some items may interact with your medicine. What should I watch for while using this medication? Your condition will be monitored carefully while you are receiving this medication. This medication may increase the side effects of 5-fluorouracil. Tell your care team if you have diarrhea or mouth sores that do not get better or that get worse. What side effects may I notice from receiving this medication?  Side effects that you should report to your care team as soon as possible: Allergic reactions--skin rash, itching, hives, swelling of the face, lips, tongue, or throat This list may not describe all possible side effects. Call your doctor for medical advice about side effects. You may report side effects to FDA at 1-800-FDA-1088. Where should I keep my medication? This medication is given in a hospital or clinic. It will not be stored at home. NOTE: This sheet is a summary. It may not cover all possible information. If you have questions about this medicine, talk to your doctor, pharmacist, or health care provider.  2024 Elsevier/Gold Standard (2021-11-13 00:00:00)  Fluorouracil Injection What is this medication? FLUOROURACIL (flure oh YOOR a sil) treats some types of cancer. It works by slowing down the growth of cancer cells. This medicine may be used for other purposes; ask your health care provider or pharmacist if you have questions. COMMON BRAND  NAME(S): Adrucil What should I tell my care team before I take this medication? They need to know if you have any of these conditions: Blood disorders Dihydropyrimidine dehydrogenase (DPD) deficiency Infection, such as chickenpox, cold sores, herpes Kidney disease Liver disease Poor nutrition Recent or ongoing radiation therapy An unusual or allergic reaction to fluorouracil, other medications, foods, dyes, or preservatives If you or your partner are pregnant or trying to get pregnant Breast-feeding How should I use this medication? This medication is injected into a vein. It is administered by your care team in a hospital or clinic setting. Talk to your care team about the use of this medication in children. Special care may be needed. Overdosage: If you think you have taken too much of this medicine contact a poison control center or emergency room at once. NOTE: This medicine is only for you. Do not share this medicine with others. What if I miss a dose? Keep appointments for follow-up doses. It is important not to miss your dose. Call your care team if you are unable to keep an appointment. What may interact with this medication? Do not take this medication with any of the following: Live virus vaccines This medication may also interact with the following: Medications that treat or prevent blood clots, such as warfarin, enoxaparin, dalteparin This list may not describe all possible interactions. Give your health care provider a list of all the medicines, herbs, non-prescription drugs, or dietary supplements you use. Also tell them if you smoke, drink alcohol, or use illegal drugs. Some items may interact with your medicine. What should I watch for while using this medication? Your condition will be monitored carefully while you are receiving this medication. This medication may make you feel generally unwell. This is not uncommon as chemotherapy can affect healthy cells as well as  cancer cells. Report any side effects. Continue your course of treatment even though you feel ill unless your care team tells you to stop. In some cases, you may be given additional medications to help with side effects. Follow all directions for their use. This medication may increase your risk of getting an infection. Call your care team for advice if you get a fever, chills, sore throat, or other symptoms of a cold or flu. Do not treat yourself. Try to avoid being around people who are sick. This medication may increase your risk to bruise or bleed. Call your care team if you notice any unusual bleeding. Be careful brushing or flossing your teeth or using a toothpick because you may get an  infection or bleed more easily. If you have any dental work done, tell your dentist you are receiving this medication. Avoid taking medications that contain aspirin, acetaminophen, ibuprofen, naproxen, or ketoprofen unless instructed by your care team. These medications may hide a fever. Do not treat diarrhea with over the counter products. Contact your care team if you have diarrhea that lasts more than 2 days or if it is severe and watery. This medication can make you more sensitive to the sun. Keep out of the sun. If you cannot avoid being in the sun, wear protective clothing and sunscreen. Do not use sun lamps, tanning beds, or tanning booths. Talk to your care team if you or your partner wish to become pregnant or think you might be pregnant. This medication can cause serious birth defects if taken during pregnancy and for 3 months after the last dose. A reliable form of contraception is recommended while taking this medication and for 3 months after the last dose. Talk to your care team about effective forms of contraception. Do not father a child while taking this medication and for 3 months after the last dose. Use a condom while having sex during this time period. Do not breastfeed while taking this  medication. This medication may cause infertility. Talk to your care team if you are concerned about your fertility. What side effects may I notice from receiving this medication? Side effects that you should report to your care team as soon as possible: Allergic reactions--skin rash, itching, hives, swelling of the face, lips, tongue, or throat Heart attack--pain or tightness in the chest, shoulders, arms, or jaw, nausea, shortness of breath, cold or clammy skin, feeling faint or lightheaded Heart failure--shortness of breath, swelling of the ankles, feet, or hands, sudden weight gain, unusual weakness or fatigue Heart rhythm changes--fast or irregular heartbeat, dizziness, feeling faint or lightheaded, chest pain, trouble breathing High ammonia level--unusual weakness or fatigue, confusion, loss of appetite, nausea, vomiting, seizures Infection--fever, chills, cough, sore throat, wounds that don't heal, pain or trouble when passing urine, general feeling of discomfort or being unwell Low red blood cell level--unusual weakness or fatigue, dizziness, headache, trouble breathing Pain, tingling, or numbness in the hands or feet, muscle weakness, change in vision, confusion or trouble speaking, loss of balance or coordination, trouble walking, seizures Redness, swelling, and blistering of the skin over hands and feet Severe or prolonged diarrhea Unusual bruising or bleeding Side effects that usually do not require medical attention (report to your care team if they continue or are bothersome): Dry skin Headache Increased tears Nausea Pain, redness, or swelling with sores inside the mouth or throat Sensitivity to light Vomiting This list may not describe all possible side effects. Call your doctor for medical advice about side effects. You may report side effects to FDA at 1-800-FDA-1088. Where should I keep my medication? This medication is given in a hospital or clinic. It will not be stored  at home. NOTE: This sheet is a summary. It may not cover all possible information. If you have questions about this medicine, talk to your doctor, pharmacist, or health care provider.  2024 Elsevier/Gold Standard (2021-10-16 00:00:00)

## 2023-09-02 NOTE — Progress Notes (Signed)
 [facility]   [phone] [phone]   Clinic Follow up Note   Patient Care Team: Anabel Halon, MD as PCP - General (Internal Medicine) Rollene Rotunda, MD as PCP - Cardiology (Cardiology) Donnelly Angelica, RN as Oncology Nurse Navigator Pershing Proud, RN as Oncology Nurse Navigator Griselda Miner, MD as Consulting Physician (General Surgery) Malachy Mood, MD as Consulting Physician (Hematology) Lonie Peak, MD as Attending Physician (Radiation Oncology) Erroll Luna, [facility] (Inactive) as Pharmacist (Pharmacist)  Date of Service:  09/02/2023  CHIEF COMPLAINT: f/u of pancreatic cancer  CURRENT THERAPY:  Neoadjuvant chemotherapy FOLFIRINOX  Oncology History   Pancreatic adenocarcinoma (HCC) cT2N0M0, stage IB -Patient presented with obstructive jaundice, CT and MRI showed a 2.5 cm mass in the head of the pancreas, with pancreatic duct and biliary duct dilatation.  ERCP was attempted twice but failed due to deformed duodenum -She underwent ERCP and stent placement, EUS with fine-needle biopsy of the pancreatic mass by Dr. Meridee Score on July 08, 2023.  Biopsy confirmed adenocarcinoma. -Due to the tumor invasion to portal vein and SMV, I recommended neoadjuvant chemotherapy FOLFIRINOX, she started on 08/05/2023    Assessment and Plan    Pancreatic cancer Currently undergoing chemotherapy for pancreatic cancer, on cycle three. Tolerating treatment well with no significant side effects such as nausea or vomiting. Slight weight decrease from 164 to 161 pounds, likely due to reduced appetite. Genetic testing negative for significant mutations. - Continue chemotherapy with the next cycle on March 25. - Monitor weight weekly and encourage nutritional intake. - Suggest nutritional supplements like Ensure or Boost, or homemade smoothies with protein powder and high-calorie ingredients. - Review blood counts and labs when available. - Schedule a  follow-up scan after cycle five or six.  Nutritional deficiency risk Decreased appetite leading to slight weight loss. Open to trying nutritional supplements. Goal is to maintain weight and nutritional status during chemotherapy. - Encourage the use of nutritional supplements like Ensure or Boost. - Suggest making homemade smoothies with protein powder and high-calorie ingredients. - Monitor weight weekly.     Neutropenia -Secondary to chemotherapy.  G-CSF was held last cycle due to leukocytosis.  Will restart this cycle.  Plan -Lab reviewed, ANC 1.1, adequate for treatment, will proceed cycle 3 FOLFIRINOX today with G-CSF on day 3 -Follow-up in 2 weeks before cycle 4.    SUMMARY OF ONCOLOGIC HISTORY: Oncology History  Ductal carcinoma in situ (DCIS) of right breast  01/28/2019 Mammogram   Diagnostic Mammogram 01/28/19  IMPRESSION: Right breast retroareolar 7 mm grouped pleomorphic calcifications are suspicious.   02/02/2019 Initial Biopsy   Diagnosis 02/02/19 Breast, right, needle core biopsy, outer retroareolar - DUCTAL CARCINOMA IN SITU WITH CALCIFICATIONS. Microscopic Comment The ductal carcinoma in situ is intermediate grade. Estrogen and progesterone receptors will be performed.  Results: IMMUNOHISTOCHEMICAL AND MORPHOMETRIC ANALYSIS PERFORMED MANUALLY Estrogen Receptor: 90%, POSITIVE, STRONG STAINING INTENSITY Progesterone Receptor: 20%, POSITIVE, STRONG STAINING INTENSITY    02/02/2019 Cancer Staging   Staging form: Breast, AJCC 8th Edition - Clinical stage from 02/02/2019: Stage 0 (cTis (DCIS), cN0, cM0, G2, ER+, PR+, HER2: Not Assessed) - Signed by Malachy Mood, MD on 02/10/2019   02/04/2019 Initial Diagnosis   Ductal carcinoma in situ (DCIS) of right breast   03/18/2019 Surgery   RIGHT BREAST LUMPECTOMY WITH RADIOACTIVE SEED LOCALIZATION by Dr. Carolynne Edouard 03/18/19   03/18/2019 Pathology Results   FINAL MICROSCOPIC DIAGNOSIS: 03/18/19 A. BREAST, RIGHT, LUMPECTOMY:  - Ductal  carcinoma in situ with  calcifications and necrosis, 1.4 cm.  - Ductal carcinoma in situ focally 0.1 cm from medial and anterior  lumpectomy margins.  - Ductal carcinoma in situ involves the final anterior margin (See part  D).  - Ductal carcinoma in situ focally 0.3 cm from superior lumpectomy  margin.  - Biopsy site and biopsy clip.   B. BREAST, RIGHT LATERAL MARGIN, EXCISION:  - Benign breast tissue.  - No ductal carcinoma in situ.  - Final lateral margin greater than 1 cm.   C. BREAST, RIGHT MEDIAL MARGIN, EXCISION:  - Ductal carcinoma in situ, 0.3 cm.  - Ductal carcinoma in situ with focally 0.3 cm from final medial margin.   D. BREAST, RIGHT ANTERIOR MARGIN, EXCISION:  - Ductal carcinoma in situ, 1.5 cm.  - Ductal carcinoma in situ focally involves final anterior margin.   E. BREAST, RIGHT DEEP MARGIN, EXCISION:  - Fibrocystic changes.  - No ductal carcinoma in situ.  - Final posterior margin greater than 0.7 cm.    04/08/2019 Surgery   RE-EXCISION OF RIGHT BREAST ANTERIOR MARGINS by Dr Carolynne Edouard 04/08/19   04/08/2019 Pathology Results    DIAGNOSIS: 04/08/19  A. BREAST, RIGHT, ANTERIOR SUPERIOR, EXCISION:  - Intermediate grade ductal carcinoma in situ with necrosis.  - In situ carcinoma is <1 mm (not on ink) from the new anterior superior  margin, multifocally.  - Intraductal papilloma.  - Fibrocystic change.   B. BREAST, RIGHT, ANTERIOR INFERIOR, EXCISION:  - Benign breast tissue with resection changes.   C. BREAST, RIGHT, MEDIAL, EXCISION:  - Benign breast tissue with resection changes.  - Fibroadenomatoid and fibrocystic changes.   05/06/2019 Mammogram   Right Mammogram 05/06/19 IMPRESSION: Suspicious finding with 2 groups of microcalcifications in the medial right breast. The anterior group measures 2 x 2 x 2 mm. The more posterior group measures 4 x 3 x 4 mm.   05/12/2019 Pathology Results   Diagnosis 05/12/19 1. Breast, right, needle core biopsy,  medial - DUCTAL CARCINOMA IN SITU WITH CALCIFICATIONS. - SEE MICROSCOPIC DESCRIPTION. 2. Breast, right, needle core biopsy, medial - DUCTAL CARCINOMA IN SITU WITH CALCIFICATIONS. - SEE MICROSCOPIC DESCRIPTION. Results: IMMUNOHISTOCHEMICAL AND MORPHOMETRIC ANALYSIS PERFORMED MANUALLY Estrogen Receptor: 100%, POSITIVE, STRONG STAINING INTENSITY Progesterone Receptor: 0%, NEGATIVE COMMENT: The negative hormone receptor study(ies) in this case has an internal positive control. Results: IMMUNOHISTOCHEMICAL AND MORPHOMETRIC ANALYSIS PERFORMED MANUALLY Estrogen Receptor: 100%, POSITIVE, STRONG STAINING INTENSITY Progesterone Receptor: 0%, NEGATIVE COMMENT: The negative hormone receptor study(ies) in this case has an internal positive control.   07/26/2019 Surgery   RIGHT MASTECTOMY WITH SENTINEL NODE BIOPSY by Dr. Carolynne Edouard 07/26/19   07/26/2019 Pathology Results   FINAL MICROSCOPIC DIAGNOSIS: 07/26/19  A. LYMPH NODE, RIGHT #1, SENTINEL,  BIOPSY:  -  No carcinoma identified in one lymph node (0/1)   B. BREAST, RIGHT, MASTECTOMY:  -  Ductal carcinoma in situ, intermediate grade, 0.6 cm  -  Margins uninvolved by carcinoma (greater than 1 cm; posterior margin)  -  Previous biopsy site changes (x2)  -  See oncology table below   07/26/2019 Cancer Staging   Staging form: Breast, AJCC 8th Edition - Pathologic stage from 07/26/2019: Stage 0 (pTis (DCIS), pN0, cM0, G2, ER+, PR+, HER2: Not Assessed) - Signed by Malachy Mood, MD on 08/10/2019   08/15/2023 Genetic Testing   Negative [facility] CancerNext-Expanded +RNAinsight Panel.  VUS in ATM at c.4910-891G>A and SDHA at c.-1C>G.  Report date is 08/15/2023.   The CancerNext-Expanded gene panel offered by [facility] and  includes sequencing, rearrangement, and RNA analysis for the following 76 genes: AIP, ALK, APC, ATM, AXIN2, BAP1, BARD1, BMPR1A, BRCA1, BRCA2, BRIP1, CDC73, CDH1, CDK4, CDKN1B, CDKN2A, CEBPA, CHEK2, CTNNA1, DDX41, DICER1, ETV6, FH, FLCN, GATA2, LZTR1,  MAX, MBD4, MEN1, MET, MLH1, MSH2, MSH3, MSH6, MUTYH, NF1, NF2, NTHL1, PALB2, PHOX2B, PMS2, POT1, PRKAR1A, PTCH1, PTEN, RAD51C, RAD51D, RB1, RET, RUNX1, SDHA, SDHAF2, SDHB, SDHC, SDHD, SMAD4, SMARCA4, SMARCB1, SMARCE1, STK11, SUFU, TMEM127, TP53, TSC1, TSC2, VHL, and WT1 (sequencing and deletion/duplication); EGFR, HOXB13, KIT, MITF, PDGFRA, POLD1, and POLE (sequencing only); EPCAM and GREM1 (deletion/duplication only).    Pancreatic adenocarcinoma (HCC)  07/07/2022 Cancer Staging   Staging form: Exocrine Pancreas, AJCC 8th Edition - Clinical stage from 07/07/2022: Stage IB (cT2, cN0, cM0) - Signed by Malachy Mood, MD on 07/22/2023 Stage prefix: Initial diagnosis Total positive nodes: 0   06/13/2023 Initial Diagnosis   Pancreatic adenocarcinoma (HCC)   08/06/2023 -  Chemotherapy   Patient is on Treatment Plan : PANCREAS Modified FOLFIRINOX q14d x 4 cycles     08/15/2023 Genetic Testing   Negative [facility] CancerNext-Expanded +RNAinsight Panel.  VUS in ATM at c.4910-891G>A and SDHA at c.-1C>G.  Report date is 08/15/2023.   The CancerNext-Expanded gene panel offered by [facility] and includes sequencing, rearrangement, and RNA analysis for the following 76 genes: AIP, ALK, APC, ATM, AXIN2, BAP1, BARD1, BMPR1A, BRCA1, BRCA2, BRIP1, CDC73, CDH1, CDK4, CDKN1B, CDKN2A, CEBPA, CHEK2, CTNNA1, DDX41, DICER1, ETV6, FH, FLCN, GATA2, LZTR1, MAX, MBD4, MEN1, MET, MLH1, MSH2, MSH3, MSH6, MUTYH, NF1, NF2, NTHL1, PALB2, PHOX2B, PMS2, POT1, PRKAR1A, PTCH1, PTEN, RAD51C, RAD51D, RB1, RET, RUNX1, SDHA, SDHAF2, SDHB, SDHC, SDHD, SMAD4, SMARCA4, SMARCB1, SMARCE1, STK11, SUFU, TMEM127, TP53, TSC1, TSC2, VHL, and WT1 (sequencing and deletion/duplication); EGFR, HOXB13, KIT, MITF, PDGFRA, POLD1, and POLE (sequencing only); EPCAM and GREM1 (deletion/duplication only).       Discussed the use of AI scribe software for clinical note transcription with the patient, who gave verbal consent to proceed.  History of Present Illness    The patient, a 78 year old with pancreatic cancer, presents for a routine follow-up after chemotherapy. She denies any adverse effects from the last chemotherapy cycle, including fatigue, nausea, or vomiting. She reports a decrease in appetite, which has led to a slight weight loss. Despite this, she has been eating better recently. She has not tried nutritional supplements like Ensure or Boost but is open to trying them. She has regular bowel movements. She has met with a surgeon and has undergone genetic testing, which was negative.         All other systems were reviewed with the patient and are negative.  MEDICAL HISTORY:  Past Medical History:  Diagnosis Date   Allergy    Ambulates with cane    straight cane   Arthritis    knee, back   CHF (congestive heart failure) (HCC)    Ductal carcinoma in situ of breast 12/2018   R Breast-mastectomy only   Gout    History of blood transfusion 1986   w/ Hysterectomy surgery   Hyperlipidemia    diet controlled - no meds   Hypertension    Hypothyroidism    Pelvic kidney    lower right pelvic kidney    SBO (small bowel obstruction) (HCC) 10/2016   surgery    Sleep apnea    Mild - no mask needed per sleep study   Smoker    quit smoking 2018   Type 2 diabetes mellitus (HCC)     SURGICAL HISTORY:  Past Surgical History:  Procedure Laterality Date   ABDOMINAL HYSTERECTOMY  1986   COMPLETE-precancerous   APPENDECTOMY     pt states it was removed when gallbladder was removed.   AUGMENTATION MAMMAPLASTY     BACK SURGERY     lower back   BILIARY BRUSHING  07/08/2023   Procedure: BILIARY BRUSHING;  Surgeon: Meridee Score Netty Starring., MD;  Location: [facility];  Service: Gastroenterology;;   BILIARY STENT PLACEMENT N/A 07/08/2023   Procedure: BILIARY STENT PLACEMENT;  Surgeon: Lemar Lofty., MD;  Location: [facility];  Service: Gastroenterology;  Laterality: N/A;   BIOPSY  06/14/2023   Procedure: BIOPSY;  Surgeon: Iva Boop, MD;  Location: [facility];  Service: Gastroenterology;;   BIOPSY  07/08/2023   Procedure: BIOPSY;  Surgeon: Lemar Lofty., MD;  Location: [facility];  Service: Gastroenterology;;   BREAST BIOPSY Right 05/12/2019   times 2   BREAST LUMPECTOMY WITH RADIOACTIVE SEED LOCALIZATION Right 03/18/2019   Procedure: RIGHT BREAST LUMPECTOMY WITH RADIOACTIVE SEED LOCALIZATION;  Surgeon: Griselda Miner, MD;  Location: [facility];  Service: General;  Laterality: Right;   CHOLECYSTECTOMY     COLONOSCOPY  02/15/2008   [facility]    COLONOSCOPY WITH PROPOFOL  02/27/2018   Dr.Nandigam   ECTOPIC PREGNANCY SURGERY     ERCP N/A 06/14/2023   Procedure: ENDOSCOPIC RETROGRADE CHOLANGIOPANCREATOGRAPHY (ERCP);  Surgeon: Iva Boop, MD;  Location: [facility];  Service: Gastroenterology;  Laterality: N/A;   ERCP N/A 06/27/2023   Procedure: ENDOSCOPIC RETROGRADE CHOLANGIOPANCREATOGRAPHY (ERCP);  Surgeon: Lynann Bologna, MD;  Location: [facility];  Service: Gastroenterology;  Laterality: N/A;   ERCP N/A 07/08/2023   Procedure: ENDOSCOPIC RETROGRADE CHOLANGIOPANCREATOGRAPHY (ERCP);  Surgeon: Lemar Lofty., MD;  Location: [facility];  Service: Gastroenterology;  Laterality: N/A;   ESOPHAGOGASTRODUODENOSCOPY N/A 07/08/2023   Procedure: ESOPHAGOGASTRODUODENOSCOPY (EGD);  Surgeon: Lemar Lofty., MD;  Location: [facility];  Service: Gastroenterology;  Laterality: N/A;   ESOPHAGOGASTRODUODENOSCOPY (EGD) WITH PROPOFOL N/A 06/14/2023   Procedure: ESOPHAGOGASTRODUODENOSCOPY (EGD) WITH PROPOFOL;  Surgeon: Iva Boop, MD;  Location: [facility];  Service: Gastroenterology;  Laterality: N/A;   EUS N/A 07/08/2023   Procedure: UPPER ENDOSCOPIC ULTRASOUND (EUS) LINEAR;  Surgeon: Lemar Lofty., MD;  Location: [facility];  Service: Gastroenterology;  Laterality: N/A;   FINE NEEDLE ASPIRATION N/A 07/08/2023   Procedure: FINE NEEDLE ASPIRATION (FNA) LINEAR;  Surgeon: Lemar Lofty., MD;  Location: [facility];  Service: Gastroenterology;  Laterality: N/A;   IR IMAGING GUIDED PORT INSERTION  07/29/2023   JOINT REPLACEMENT     Left hip total Dr. Lequita Halt 10-01-17   MALONEY DILATION  06/14/2023   Procedure: Elease Hashimoto DILATION;  Surgeon: Iva Boop, MD;  Location: [facility];  Service: Gastroenterology;;   MASTECTOMY Right 07/26/2019   PANCREATIC STENT PLACEMENT  06/14/2023   Procedure: PANCREATIC STENT PLACEMENT;  Surgeon: Iva Boop, MD;  Location: [facility];  Service: Gastroenterology;;   PANCREATIC STENT PLACEMENT  06/27/2023   Procedure: PANCREATIC STENT PLACEMENT;  Surgeon: Lynann Bologna, MD;  Location: [facility];  Service: Gastroenterology;;   POLYPECTOMY     polypectomy-oropharynx     RE-EXCISION OF BREAST CANCER,SUPERIOR MARGINS Right 04/08/2019   Procedure: RE-EXCISION OF RIGHT BREAST ANTERIOR MARGINS;  Surgeon: Chevis Pretty III, MD;  Location: [facility];  Service: General;  Laterality: Right;   right knee meniscus     SBO  10/2016   small bowel obstruction   SIMPLE MASTECTOMY WITH AXILLARY SENTINEL NODE BIOPSY Right 07/26/2019  Procedure: RIGHT MASTECTOMY WITH SENTINEL NODE BIOPSY;  Surgeon: Griselda Miner, MD;  Location: [facility];  Service: General;  Laterality: Right;   SPHINCTEROTOMY  06/27/2023   Procedure: SPHINCTEROTOMY;  Surgeon: Lynann Bologna, MD;  Location: [facility];  Service: Gastroenterology;;   Dennison Mascot  07/08/2023   Procedure: Dennison Mascot;  Surgeon: Lemar Lofty., MD;  Location: [facility];  Service: Gastroenterology;;   TOTAL HIP ARTHROPLASTY Left 10/01/2017   Procedure: LEFT  TOTAL HIP ARTHROPLASTY ANTERIOR APPROACH;  Surgeon: Ollen Gross, MD;  Location: [facility];  Service: Orthopedics;  Laterality: Left;   TOTAL HIP ARTHROPLASTY Right 03/11/2018   Procedure: RIGHT TOTAL HIP ARTHROPLASTY ANTERIOR APPROACH;  Surgeon: Ollen Gross, MD;  Location: [facility];  Service: Orthopedics;   Laterality: Right;    UPPER GASTROINTESTINAL ENDOSCOPY      I have reviewed the social history and family history with the patient and they are unchanged from previous note.  ALLERGIES:  is allergic to lisinopril, statins, and atorvastatin.  MEDICATIONS:  Current Outpatient Medications  Medication Sig Dispense Refill   Cholecalciferol (VITAMIN D-3) 125 MCG (5000 UT) TABS Take 1 tablet by mouth daily.     famotidine (PEPCID) 20 MG tablet Take 1 tablet (20 mg total) by mouth 2 (two) times daily. (Patient taking differently: Take 20 mg by mouth daily.) 30 tablet 1   fluticasone (FLONASE) 50 MCG/ACT nasal spray Place 2 sprays into both nostrils daily. 16 g 6   Glucose Blood (BLOOD GLUCOSE TEST STRIPS) STRP 1 each by In Vitro route in the morning, at noon, and at bedtime. May substitute to any manufacturer covered by patient's insurance. 100 strip 5   insulin aspart (NOVOLOG) 100 UNIT/ML FlexPen Inject 0-6 Units into the skin 3 (three) times daily with meals. Check Blood Glucose (BG) and inject per scale: BG <150= 0 unit; BG 150-200= 1 unit; BG 201-250= 2 unit; BG 251-300= 3 unit; BG 301-350= 4 unit; BG 351-400= 5 unit; BG >400= 6 unit and Call Primary Care. 15 mL 0   Insulin Glargine (BASAGLAR KWIKPEN) 100 UNIT/ML Inject 12 Units into the skin daily. May substitute as needed per insurance. (Patient taking differently: Inject 12 Units into the skin at bedtime.) 15 mL 2   Lancets (ONETOUCH DELICA PLUS LANCET33G) MISC 1 each by Does not apply route 3 (three) times daily. 100 each 0   lidocaine-prilocaine (EMLA) cream Apply 1 Application topically as needed. 30 g 2   losartan (COZAAR) 100 MG tablet Take 1 tablet by mouth once daily 90 tablet 0   NIFEdipine (ADALAT CC) 60 MG 24 hr tablet Take 1 tablet by mouth once daily 30 tablet 0   ondansetron (ZOFRAN) 8 MG tablet Take 1 tablet (8 mg total) by mouth every 8 (eight) hours as needed for nausea or vomiting. 20 tablet 1   potassium chloride  (KLOR-CON) 10 MEQ tablet Take 1 tablet (10 mEq total) by mouth daily. 30 tablet 1   prochlorperazine (COMPAZINE) 10 MG tablet Take 1 tablet (10 mg total) by mouth every 6 (six) hours as needed for nausea or vomiting. 30 tablet 2   thyroid (NP THYROID) 90 MG tablet Take 1 tablet (90 mg total) by mouth daily. 30 tablet 5   No current facility-administered medications for this visit.    PHYSICAL EXAMINATION: ECOG PERFORMANCE STATUS: 1 - Symptomatic but completely ambulatory  Vitals:   09/02/23 1042  BP: (!) 156/82  Pulse: (!) 56  Resp: (!) 22  Temp: (!) 97.4 F (  36.3 C)  SpO2: 95%   Wt Readings from Last 3 Encounters:  09/02/23 161 lb 3.2 oz (73.1 kg)  08/19/23 164 lb (74.4 kg)  08/05/23 164 lb 8 oz (74.6 kg)     GENERAL:alert, no distress and comfortable SKIN: skin color, texture, turgor are normal, no rashes or significant lesions EYES: normal, Conjunctiva are pink and non-injected, sclera clear NECK: supple, thyroid normal size, non-tender, without nodularity LYMPH:  no palpable lymphadenopathy in the cervical, axillary  LUNGS: clear to auscultation and percussion with normal breathing effort HEART: regular rate & rhythm and no murmurs and no lower extremity edema ABDOMEN:abdomen soft, non-tender and normal bowel sounds Musculoskeletal:no cyanosis of digits and no clubbing  NEURO: alert & oriented x 3 with fluent speech, no focal motor/sensory deficits    LABORATORY DATA:  I have reviewed the data as listed    Latest Ref Rng & Units 09/02/2023   10:16 AM 08/19/2023    9:05 AM 08/05/2023    1:18 PM  CBC  WBC 4.0 - 10.5 K/uL 4.1  17.8  7.7   Hemoglobin 12.0 - 15.0 g/dL 91.4  78.2  95.6   Hematocrit 36.0 - 46.0 % 30.0  33.5  34.0   Platelets 150 - 400 K/uL 357  210  227         Latest Ref Rng & Units 09/02/2023   10:16 AM 08/19/2023    9:05 AM 08/05/2023    1:18 PM  CMP  Glucose 70 - 99 mg/dL 213  086  578   BUN 8 - 23 mg/dL 10  9  16    Creatinine 0.44 - 1.00 mg/dL  4.69  6.29  5.28   Sodium 135 - 145 mmol/L 141  142  139   Potassium 3.5 - 5.1 mmol/L 3.6  3.2  3.5   Chloride 98 - 111 mmol/L 106  108  105   CO2 22 - 32 mmol/L 27  26  28    Calcium 8.9 - 10.3 mg/dL 8.4  8.4  9.3   Total Protein 6.5 - 8.1 g/dL 6.6  6.5  6.9   Total Bilirubin 0.0 - 1.2 mg/dL 0.5  0.6  1.1   Alkaline Phos 38 - 126 U/L 414  231  339   AST 15 - 41 U/L 32  26  34   ALT 0 - 44 U/L 57  27  45       RADIOGRAPHIC STUDIES: I have personally reviewed the radiological images as listed and agreed with the findings in the report. No results found.    Orders Placed This Encounter  Procedures   CBC with Differential (Cancer Center Only)    Standing Status:   Future    Expected Date:   09/30/2023    Expiration Date:   09/29/2024   CMP (Cancer Center only)    Standing Status:   Future    Expected Date:   09/30/2023    Expiration Date:   09/29/2024   CBC with Differential (Cancer Center Only)    Standing Status:   Future    Expected Date:   10/14/2023    Expiration Date:   10/13/2024   CMP (Cancer Center only)    Standing Status:   Future    Expected Date:   10/14/2023    Expiration Date:   10/13/2024   All questions were answered. The patient knows to call the clinic with any problems, questions or concerns. No barriers to learning was detected. The total  time spent in the appointment was 25 minutes.     Malachy Mood, MD 09/02/2023

## 2023-09-02 NOTE — Assessment & Plan Note (Signed)
 cT2N0M0, stage IB -Patient presented with obstructive jaundice, CT and MRI showed a 2.5 cm mass in the head of the pancreas, with pancreatic duct and biliary duct dilatation.  ERCP was attempted twice but failed due to deformed duodenum -She underwent ERCP and stent placement, EUS with fine-needle biopsy of the pancreatic mass by Dr. Meridee Score on July 08, 2023.  Biopsy confirmed adenocarcinoma. -Due to the tumor invasion to portal vein and SMV, I recommended neoadjuvant chemotherapy FOLFIRINOX, she started on 08/05/2023

## 2023-09-02 NOTE — Progress Notes (Signed)
 Note created in error

## 2023-09-03 ENCOUNTER — Telehealth: Payer: Self-pay | Admitting: Internal Medicine

## 2023-09-03 ENCOUNTER — Other Ambulatory Visit: Payer: Self-pay

## 2023-09-03 DIAGNOSIS — C259 Malignant neoplasm of pancreas, unspecified: Secondary | ICD-10-CM | POA: Diagnosis not present

## 2023-09-03 MED ORDER — PEN NEEDLES 31G X 5 MM MISC: 1.0000 | Freq: Three times a day (TID) | 0 refills | Status: DC

## 2023-09-03 NOTE — Telephone Encounter (Signed)
 Refill sent.

## 2023-09-03 NOTE — Telephone Encounter (Unsigned)
 Copied from CRM [id number]. Topic: Clinical - Medication Refill >> Sep 03, 2023 12:38 PM Izetta Dakin wrote: Most Recent Primary Care Visit:  Provider: Anabel Halon  Department: [facility]  Visit Type: OFFICE VISIT  Date: 07/31/2023  Medication: [employer] pen needles 31g   Has the patient contacted their pharmacy? Yes (Agent: If no, request that the patient contact the pharmacy for the refill. If patient does not wish to contact the pharmacy document the reason why and proceed with request.) (Agent: If yes, when and what did the pharmacy advise?)  Is this the correct pharmacy for this prescription? Yes If no, delete pharmacy and type the correct one.  This is the patient's preferred pharmacy:  [facility] [address] [place] - [address] [place] [address] [place] [address] Phone: [phone] Fax: [phone]     Has the prescription been filled recently? No  Is the patient out of the medication? Yes  Has the patient been seen for an appointment in the last year OR does the patient have an upcoming appointment? Yes  Can we respond through MyChart? Yes  Agent: Please be advised that Rx refills may take up to 3 business days. We ask that you follow-up with your pharmacy.

## 2023-09-04 ENCOUNTER — Inpatient Hospital Stay: Payer: PPO

## 2023-09-04 VITALS — BP 130/89 | HR 59 | Temp 98.5°F | Resp 18

## 2023-09-04 DIAGNOSIS — Z95828 Presence of other vascular implants and grafts: Secondary | ICD-10-CM

## 2023-09-04 DIAGNOSIS — C259 Malignant neoplasm of pancreas, unspecified: Secondary | ICD-10-CM

## 2023-09-04 DIAGNOSIS — Z5111 Encounter for antineoplastic chemotherapy: Secondary | ICD-10-CM | POA: Diagnosis not present

## 2023-09-04 MED ORDER — SODIUM CHLORIDE 0.9% FLUSH: 10.0000 mL | Freq: Once | INTRAVENOUS | Status: DC

## 2023-09-04 MED ORDER — PEGFILGRASTIM-CBQV 6 MG/0.6ML ~~LOC~~ SOSY
6.0000 mg | PREFILLED_SYRINGE | Freq: Once | SUBCUTANEOUS | Status: AC
  Administered 2023-09-04: 6 mg via SUBCUTANEOUS
  Filled 2023-09-04: qty 0.6

## 2023-09-04 MED ORDER — HEPARIN SOD (PORK) LOCK FLUSH 100 UNIT/ML IV SOLN: 250.0000 [IU] | Freq: Once | INTRAVENOUS | Status: DC

## 2023-09-04 NOTE — Patient Instructions (Signed)

## 2023-09-09 ENCOUNTER — Other Ambulatory Visit: Payer: Self-pay

## 2023-09-11 ENCOUNTER — Encounter: Payer: Self-pay | Admitting: Hematology

## 2023-09-14 NOTE — Assessment & Plan Note (Signed)
 cT2N0M0, stage IB -Patient presented with obstructive jaundice, CT and MRI showed a 2.5 cm mass in the head of the pancreas, with pancreatic duct and biliary duct dilatation.  ERCP was attempted twice but failed due to deformed duodenum -She underwent ERCP and stent placement, EUS with fine-needle biopsy of the pancreatic mass by Dr. Meridee Score on July 08, 2023.  Biopsy confirmed adenocarcinoma. -Due to the tumor invasion to portal vein and SMV, neoadjuvant chemotherapy FOLFIRINOX was recommended. She started on 08/05/2023.  -she presents for cycle 4 day 1 and is getting g-CSF o nday 3 of treatment.

## 2023-09-14 NOTE — Progress Notes (Unsigned)
 Patient Care Team: Anabel Halon, MD as PCP - General (Internal Medicine) Rollene Rotunda, MD as PCP - Cardiology (Cardiology) Donnelly Angelica, RN as Oncology Nurse Navigator Pershing Proud, RN as Oncology Nurse Navigator Griselda Miner, MD as Consulting Physician (General Surgery) Malachy Mood, MD as Consulting Physician (Hematology) Lonie Peak, MD as Attending Physician (Radiation Oncology) Erroll Luna, [facility] (Inactive) as Pharmacist (Pharmacist)  Clinic Day:  09/16/2023  Referring physician: Malachy Mood, MD  ASSESSMENT & PLAN:   Assessment & Plan: Pancreatic adenocarcinoma (HCC) cT2N0M0, stage IB -Patient presented with obstructive jaundice, CT and MRI showed a 2.5 cm mass in the head of the pancreas, with pancreatic duct and biliary duct dilatation.  ERCP was attempted twice but failed due to deformed duodenum -She underwent ERCP and stent placement, EUS with fine-needle biopsy of the pancreatic mass by Dr. Meridee Score on July 08, 2023.  Biopsy confirmed adenocarcinoma. -Due to the tumor invasion to portal vein and SMV, neoadjuvant chemotherapy FOLFIRINOX was recommended. She started on 08/05/2023.  -she presents for cycle 4 day 1 FOLFIRINOX. She did receive G-CSF after cycle 3. WBC is now 26.8 with ANC  20.8. will hold G-CSF on day 3 of Cycle 4. Will keep this in plan of care and will administer if needed.   Ductal carcinoma in situ (DCIS) of right breast Diagnosis 01/2019 -s/p right mastectomy 07/26/2019  Decreased appetite and weight loss Patient continues to have decreased appetite with weight loss of 4 pounds over past 2 weeks. She knows to consume Boost or Ensure to improve calorie and protein intake. She is scheduled to meet with nutritionist during her visit today.   Hypokalemia Patient's potassium level is 3.3 today.  She reports some difficulty with prescribed oral potassium supplements.  States they do not sit well in her stomach.  Recommend trial of OTC potassium  twice daily.  Check CMP in 2 weeks and will adjust dosing as indicated.  Neutropenia Secondary to chemotherapy.  Received G-CSF injection after most recent cycle.  White cell count no elevated at 26.8 with ANC 20.8.  Will hold G-CSF with this cycle.  Will keep GSF as part of the plan and administer on as needed basis.  Plan:  -Labs reviewed.  ANC 20.8.  Will proceed with cycle 4  day 1 FOLFIRINOX and hold G-CSF on day 3 of treatment cycle. -Mild hypokalemia.  Patient reports intolerance to prescribed potassium supplement.  Recommend trial of OTC potassium capsules twice daily.  Recheck labs in 2 weeks, prior to cycle 5. -Patient scheduled to meet with nutritionist during infusion today. -Labs/flush, follow-up with Dr. Mosetta Putt, and treatment with cycle 5 in 2 weeks.  The patient understands the plans discussed today and is in agreement with them.  She knows to contact our office if she develops concerns prior to her next appointment.  I provided 20 minutes of face-to-face time during this encounter and > 50% was spent counseling as documented under my assessment and plan.    Adrienne Jews, Adrienne [name]  [facility] - A DEPT OF [facility] [address] [place] [address] Dept: [phone] Dept Fax: [phone]   No orders of the defined types were placed in this encounter.     CHIEF COMPLAINT:  CC: pancreatic adenocarcinoma. History of right breast cancer   Current Treatment:  neoadjuvant chemotherapy FOLFIRINOX  INTERVAL HISTORY:  Adrienne [name] is here today for repeat clinical assessment. Was last  seen by Dr. Mosetta Putt 09/02/2023.  Received G-CSF on day 3 of treatment. Today is cycle 4 day 1 FOLFIRINOX.  WBC 26.8 with ANC 20.8.  Will hold G-CSF on day 3 of cycle 4.  Will keep G-CSF and treatment plan and administer when needed.  She continues to have reduced appetite.  When she is hungry, not able to eat much.  Denies pain in her  belly.  States she just gets full quickly.  States her left ankle is swollen today.  It is tender to walk on.  Denies injury.  States she woke up yesterday and ankle was just slightly swollen.  She denies chest pain, chest pressure, or shortness of breath. She denies headaches or visual disturbances. She denies abdominal pain, nausea, vomiting, or changes in bowel or bladder habits.  She denies fevers or chills. She denies pain. Her appetite is decreased. Her weight has decreased 4 pounds over last 2 weeks .  I have reviewed the past medical history, past surgical history, social history and family history with the patient and they are unchanged from previous note.  ALLERGIES:  is allergic to lisinopril, statins, and atorvastatin.  MEDICATIONS:  Current Outpatient Medications  Medication Sig Dispense Refill   Cholecalciferol (VITAMIN D-3) 125 MCG (5000 UT) TABS Take 1 tablet by mouth daily.     famotidine (PEPCID) 20 MG tablet Take 1 tablet (20 mg total) by mouth 2 (two) times daily. (Patient taking differently: Take 20 mg by mouth daily.) 30 tablet 1   fluticasone (FLONASE) 50 MCG/ACT nasal spray Place 2 sprays into both nostrils daily. 16 g 6   Glucose Blood (BLOOD GLUCOSE TEST STRIPS) STRP 1 each by In Vitro route in the morning, at noon, and at bedtime. May substitute to any manufacturer covered by patient's insurance. 100 strip 5   insulin aspart (NOVOLOG) 100 UNIT/ML FlexPen Inject 0-6 Units into the skin 3 (three) times daily with meals. Check Blood Glucose (BG) and inject per scale: BG <150= 0 unit; BG 150-200= 1 unit; BG 201-250= 2 unit; BG 251-300= 3 unit; BG 301-350= 4 unit; BG 351-400= 5 unit; BG >400= 6 unit and Call Primary Care. 15 mL 0   Insulin Glargine (BASAGLAR KWIKPEN) 100 UNIT/ML Inject 12 Units into the skin daily. May substitute as needed per insurance. (Patient taking differently: Inject 12 Units into the skin at bedtime.) 15 mL 2   Insulin Pen Needle (PEN NEEDLES) 31G X 5 MM  MISC 1 each by Does not apply route 3 (three) times daily. May dispense any manufacturer covered by patient's insurance. 100 each 0   Lancets (ONETOUCH DELICA PLUS LANCET33G) MISC 1 each by Does not apply route 3 (three) times daily. 100 each 0   lidocaine-prilocaine (EMLA) cream Apply 1 Application topically as needed. 30 g 2   losartan (COZAAR) 100 MG tablet Take 1 tablet by mouth once daily 90 tablet 0   NIFEdipine (ADALAT CC) 60 MG 24 hr tablet Take 1 tablet by mouth once daily 30 tablet 0   ondansetron (ZOFRAN) 8 MG tablet Take 1 tablet (8 mg total) by mouth every 8 (eight) hours as needed for nausea or vomiting. 20 tablet 1   potassium chloride (KLOR-CON) 10 MEQ tablet Take 1 tablet (10 mEq total) by mouth daily. 30 tablet 1   prochlorperazine (COMPAZINE) 10 MG tablet Take 1 tablet (10 mg total) by mouth every 6 (six) hours as needed for nausea or vomiting. 30 tablet 2   thyroid (Adrienne [name] THYROID) 90 MG  tablet Take 1 tablet (90 mg total) by mouth daily. 30 tablet 5   No current facility-administered medications for this visit.   Facility-Administered Medications Ordered in Other Visits  Medication Dose Route Frequency Provider Last Rate Last Admin   dexamethasone (DECADRON) injection 10 mg  10 mg Intravenous Once Malachy Mood, MD       dextrose 5 % solution   Intravenous Continuous Malachy Mood, MD 10 mL/hr at 09/16/23 1124 New Bag at 09/16/23 1124   famotidine (PEPCID) IVPB 20 mg premix  20 mg Intravenous Once Malachy Mood, MD       fluorouracil (ADRUCIL) 4,350 mg in sodium chloride 0.9 % 63 mL chemo infusion  2,400 mg/m2 (Treatment Plan Recorded) Intravenous 1 day or 1 dose Malachy Mood, MD       fosaprepitant (EMEND) 150 mg in sodium chloride 0.9 % 145 mL IVPB  150 mg Intravenous Once Malachy Mood, MD       irinotecan (CAMPTOSAR) 300 mg in sodium chloride 0.9 % 500 mL chemo infusion  150 mg/m2 (Treatment Plan Recorded) Intravenous Once Malachy Mood, MD       leucovorin 728 mg in sodium chloride 0.9 % 250 mL  infusion  400 mg/m2 (Treatment Plan Recorded) Intravenous Once Malachy Mood, MD       oxaliplatin (ELOXATIN) 150 mg in dextrose 5 % 500 mL chemo infusion  85 mg/m2 (Treatment Plan Recorded) Intravenous Once Malachy Mood, MD        HISTORY OF PRESENT ILLNESS:   Oncology History  Ductal carcinoma in situ (DCIS) of right breast  01/28/2019 Mammogram   Diagnostic Mammogram 01/28/19  IMPRESSION: Right breast retroareolar 7 mm grouped pleomorphic calcifications are suspicious.   02/02/2019 Initial Biopsy   Diagnosis 02/02/19 Breast, right, needle core biopsy, outer retroareolar - DUCTAL CARCINOMA IN SITU WITH CALCIFICATIONS. Microscopic Comment The ductal carcinoma in situ is intermediate grade. Estrogen and progesterone receptors will be performed.  Results: IMMUNOHISTOCHEMICAL AND MORPHOMETRIC ANALYSIS PERFORMED MANUALLY Estrogen Receptor: 90%, POSITIVE, STRONG STAINING INTENSITY Progesterone Receptor: 20%, POSITIVE, STRONG STAINING INTENSITY    02/02/2019 Cancer Staging   Staging form: Breast, AJCC 8th Edition - Clinical stage from 02/02/2019: Stage 0 (cTis (DCIS), cN0, cM0, G2, ER+, PR+, HER2: Not Assessed) - Signed by Malachy Mood, MD on 02/10/2019   02/04/2019 Initial Diagnosis   Ductal carcinoma in situ (DCIS) of right breast   03/18/2019 Surgery   RIGHT BREAST LUMPECTOMY WITH RADIOACTIVE SEED LOCALIZATION by Dr. Carolynne Edouard 03/18/19   03/18/2019 Pathology Results   FINAL MICROSCOPIC DIAGNOSIS: 03/18/19 A. BREAST, RIGHT, LUMPECTOMY:  - Ductal carcinoma in situ with calcifications and necrosis, 1.4 cm.  - Ductal carcinoma in situ focally 0.1 cm from medial and anterior  lumpectomy margins.  - Ductal carcinoma in situ involves the final anterior margin (See part  D).  - Ductal carcinoma in situ focally 0.3 cm from superior lumpectomy  margin.  - Biopsy site and biopsy clip.   B. BREAST, RIGHT LATERAL MARGIN, EXCISION:  - Benign breast tissue.  - No ductal carcinoma in situ.  - Final lateral  margin greater than 1 cm.   C. BREAST, RIGHT MEDIAL MARGIN, EXCISION:  - Ductal carcinoma in situ, 0.3 cm.  - Ductal carcinoma in situ with focally 0.3 cm from final medial margin.   D. BREAST, RIGHT ANTERIOR MARGIN, EXCISION:  - Ductal carcinoma in situ, 1.5 cm.  - Ductal carcinoma in situ focally involves final anterior margin.   E. BREAST, RIGHT DEEP MARGIN, EXCISION:  - Fibrocystic  changes.  - No ductal carcinoma in situ.  - Final posterior margin greater than 0.7 cm.    04/08/2019 Surgery   RE-EXCISION OF RIGHT BREAST ANTERIOR MARGINS by Dr Carolynne Edouard 04/08/19   04/08/2019 Pathology Results    DIAGNOSIS: 04/08/19  A. BREAST, RIGHT, ANTERIOR SUPERIOR, EXCISION:  - Intermediate grade ductal carcinoma in situ with necrosis.  - In situ carcinoma is <1 mm (not on ink) from the new anterior superior  margin, multifocally.  - Intraductal papilloma.  - Fibrocystic change.   B. BREAST, RIGHT, ANTERIOR INFERIOR, EXCISION:  - Benign breast tissue with resection changes.   C. BREAST, RIGHT, MEDIAL, EXCISION:  - Benign breast tissue with resection changes.  - Fibroadenomatoid and fibrocystic changes.   05/06/2019 Mammogram   Right Mammogram 05/06/19 IMPRESSION: Suspicious finding with 2 groups of microcalcifications in the medial right breast. The anterior group measures 2 x 2 x 2 mm. The more posterior group measures 4 x 3 x 4 mm.   05/12/2019 Pathology Results   Diagnosis 05/12/19 1. Breast, right, needle core biopsy, medial - DUCTAL CARCINOMA IN SITU WITH CALCIFICATIONS. - SEE MICROSCOPIC DESCRIPTION. 2. Breast, right, needle core biopsy, medial - DUCTAL CARCINOMA IN SITU WITH CALCIFICATIONS. - SEE MICROSCOPIC DESCRIPTION. Results: IMMUNOHISTOCHEMICAL AND MORPHOMETRIC ANALYSIS PERFORMED MANUALLY Estrogen Receptor: 100%, POSITIVE, STRONG STAINING INTENSITY Progesterone Receptor: 0%, NEGATIVE COMMENT: The negative hormone receptor study(ies) in this case has an internal  positive control. Results: IMMUNOHISTOCHEMICAL AND MORPHOMETRIC ANALYSIS PERFORMED MANUALLY Estrogen Receptor: 100%, POSITIVE, STRONG STAINING INTENSITY Progesterone Receptor: 0%, NEGATIVE COMMENT: The negative hormone receptor study(ies) in this case has an internal positive control.   07/26/2019 Surgery   RIGHT MASTECTOMY WITH SENTINEL NODE BIOPSY by Dr. Carolynne Edouard 07/26/19   07/26/2019 Pathology Results   FINAL MICROSCOPIC DIAGNOSIS: 07/26/19  A. LYMPH NODE, RIGHT #1, SENTINEL,  BIOPSY:  -  No carcinoma identified in one lymph node (0/1)   B. BREAST, RIGHT, MASTECTOMY:  -  Ductal carcinoma in situ, intermediate grade, 0.6 cm  -  Margins uninvolved by carcinoma (greater than 1 cm; posterior margin)  -  Previous biopsy site changes (x2)  -  See oncology table below   07/26/2019 Cancer Staging   Staging form: Breast, AJCC 8th Edition - Pathologic stage from 07/26/2019: Stage 0 (pTis (DCIS), pN0, cM0, G2, ER+, PR+, HER2: Not Assessed) - Signed by Malachy Mood, MD on 08/10/2019   08/15/2023 Genetic Testing   Negative [facility] CancerNext-Expanded +RNAinsight Panel.  VUS in ATM at c.4910-891G>A and SDHA at c.-1C>G.  Report date is 08/15/2023.   The CancerNext-Expanded gene panel offered by [facility] and includes sequencing, rearrangement, and RNA analysis for the following 76 genes: AIP, ALK, APC, ATM, AXIN2, BAP1, BARD1, BMPR1A, BRCA1, BRCA2, BRIP1, CDC73, CDH1, CDK4, CDKN1B, CDKN2A, CEBPA, CHEK2, CTNNA1, DDX41, DICER1, ETV6, FH, FLCN, GATA2, LZTR1, MAX, MBD4, MEN1, MET, MLH1, MSH2, MSH3, MSH6, MUTYH, NF1, NF2, NTHL1, PALB2, PHOX2B, PMS2, POT1, PRKAR1A, PTCH1, PTEN, RAD51C, RAD51D, RB1, RET, RUNX1, SDHA, SDHAF2, SDHB, SDHC, SDHD, SMAD4, SMARCA4, SMARCB1, SMARCE1, STK11, SUFU, TMEM127, TP53, TSC1, TSC2, VHL, and WT1 (sequencing and deletion/duplication); EGFR, HOXB13, KIT, MITF, PDGFRA, POLD1, and POLE (sequencing only); EPCAM and GREM1 (deletion/duplication only).    Pancreatic adenocarcinoma (HCC)  07/07/2022  Cancer Staging   Staging form: Exocrine Pancreas, AJCC 8th Edition - Clinical stage from 07/07/2022: Stage IB (cT2, cN0, cM0) - Signed by Malachy Mood, MD on 07/22/2023 Stage prefix: Initial diagnosis Total positive nodes: 0   06/13/2023 Initial Diagnosis   Pancreatic adenocarcinoma (HCC)  08/06/2023 -  Chemotherapy   Patient is on Treatment Plan : PANCREAS Modified FOLFIRINOX q14d x 4 cycles     08/15/2023 Genetic Testing   Negative [facility] CancerNext-Expanded +RNAinsight Panel.  VUS in ATM at c.4910-891G>A and SDHA at c.-1C>G.  Report date is 08/15/2023.   The CancerNext-Expanded gene panel offered by [facility] and includes sequencing, rearrangement, and RNA analysis for the following 76 genes: AIP, ALK, APC, ATM, AXIN2, BAP1, BARD1, BMPR1A, BRCA1, BRCA2, BRIP1, CDC73, CDH1, CDK4, CDKN1B, CDKN2A, CEBPA, CHEK2, CTNNA1, DDX41, DICER1, ETV6, FH, FLCN, GATA2, LZTR1, MAX, MBD4, MEN1, MET, MLH1, MSH2, MSH3, MSH6, MUTYH, NF1, NF2, NTHL1, PALB2, PHOX2B, PMS2, POT1, PRKAR1A, PTCH1, PTEN, RAD51C, RAD51D, RB1, RET, RUNX1, SDHA, SDHAF2, SDHB, SDHC, SDHD, SMAD4, SMARCA4, SMARCB1, SMARCE1, STK11, SUFU, TMEM127, TP53, TSC1, TSC2, VHL, and WT1 (sequencing and deletion/duplication); EGFR, HOXB13, KIT, MITF, PDGFRA, POLD1, and POLE (sequencing only); EPCAM and GREM1 (deletion/duplication only).        REVIEW OF SYSTEMS:   Constitutional: Denies fevers or chills.  Has had 4 pound weight loss since her last visit. Eyes: Denies blurriness of vision Ears, nose, mouth, throat, and face: Denies mucositis or sore throat Respiratory: Denies cough, dyspnea or wheezes Cardiovascular: Denies palpitation or chest discomfort. Reports mild left ankle and foot swelling since yesterday.  Gastrointestinal:  Denies nausea, heartburn or change in bowel habits. Has moderately decreased appetite.  Skin: Denies abnormal skin rashes Lymphatics: Denies new lymphadenopathy or easy bruising Neurological:Denies numbness, tingling or  new weaknesses Behavioral/Psych: Mood is stable, no new changes  All other systems were reviewed with the patient and are negative.   VITALS:   Today's Vitals   09/16/23 1029  BP: (!) 144/80  Pulse: 71  Resp: (!) 22  Temp: 97.7 F (36.5 C)  TempSrc: Temporal  SpO2: 99%  Weight: 157 lb 12.8 oz (71.6 kg)  Height: 5' 3.5" (1.613 m)   Body mass index is 27.51 kg/m.   Wt Readings from Last 3 Encounters:  09/16/23 157 lb 12.8 oz (71.6 kg)  09/02/23 161 lb 3.2 oz (73.1 kg)  08/19/23 164 lb (74.4 kg)    Body mass index is 27.51 kg/m.  Performance status (ECOG): 1 - Symptomatic but completely ambulatory  PHYSICAL EXAM:   GENERAL:alert, no distress and comfortable SKIN: skin color, texture, turgor are normal, no rashes or significant lesions EYES: normal, Conjunctiva are pink and non-injected, sclera clear OROPHARYNX:no exudate, no erythema and lips, buccal mucosa, and tongue normal  NECK: supple, thyroid normal size, non-tender, without nodularity LYMPH:  no palpable lymphadenopathy in the cervical, axillary or inguinal LUNGS: clear to auscultation and percussion with normal breathing effort HEART: regular rate & rhythm and no murmurs. Very mild, non pitting edema in left ankle and foot. Not tender. Bilateral pedal pulses are palpable.  ABDOMEN:abdomen soft, non-tender and normal bowel sounds Musculoskeletal:no cyanosis of digits and no clubbing  NEURO: alert & oriented x 3 with fluent speech, no focal motor/sensory deficits  LABORATORY DATA:  I have reviewed the data as listed    Component Value Date/Time   NA 142 09/16/2023 1006   NA 142 06/24/2023 0859   K 3.3 (L) 09/16/2023 1006   CL 106 09/16/2023 1006   CO2 27 09/16/2023 1006   GLUCOSE 178 (H) 09/16/2023 1006   BUN 9 09/16/2023 1006   BUN 19 06/24/2023 0859   CREATININE 0.92 09/16/2023 1006   CREATININE 1.05 (H) 12/05/2020 1027   CALCIUM 8.4 (L) 09/16/2023 1006   PROT 7.1 09/16/2023 1006  PROT 7.4  06/24/2023 0859   ALBUMIN 3.7 09/16/2023 1006   ALBUMIN 4.2 06/24/2023 0859   AST 41 09/16/2023 1006   ALT 59 (H) 09/16/2023 1006   ALKPHOS 655 (H) 09/16/2023 1006   BILITOT 0.4 09/16/2023 1006   GFRNONAA >60 09/16/2023 1006   GFRNONAA 67 08/29/2020 1048   GFRAA 78 08/29/2020 1048     Lab Results  Component Value Date   WBC 26.8 (H) 09/16/2023   NEUTROABS 20.8 (H) 09/16/2023   HGB 10.9 (L) 09/16/2023   HCT 32.8 (L) 09/16/2023   MCV 92.7 09/16/2023   PLT 256 09/16/2023

## 2023-09-14 NOTE — Assessment & Plan Note (Addendum)
 Diagnosis 01/2019 -s/p right mastectomy 07/26/2019

## 2023-09-15 MED FILL — Fosaprepitant Dimeglumine For IV Infusion 150 MG (Base Eq): INTRAVENOUS | Qty: 5 | Status: AC

## 2023-09-16 ENCOUNTER — Encounter: Payer: Self-pay | Admitting: Hematology

## 2023-09-16 ENCOUNTER — Inpatient Hospital Stay (HOSPITAL_BASED_OUTPATIENT_CLINIC_OR_DEPARTMENT_OTHER): Payer: PPO | Admitting: Nurse Practitioner

## 2023-09-16 ENCOUNTER — Inpatient Hospital Stay: Payer: PPO

## 2023-09-16 ENCOUNTER — Encounter: Payer: Self-pay | Admitting: Nurse Practitioner

## 2023-09-16 ENCOUNTER — Inpatient Hospital Stay: Payer: PPO | Admitting: Nutrition

## 2023-09-16 VITALS — BP 123/64 | HR 67 | Resp 17

## 2023-09-16 VITALS — BP 144/80 | HR 71 | Temp 97.7°F | Resp 22 | Ht 63.5 in | Wt 157.8 lb

## 2023-09-16 DIAGNOSIS — Z5111 Encounter for antineoplastic chemotherapy: Secondary | ICD-10-CM | POA: Diagnosis not present

## 2023-09-16 DIAGNOSIS — Z95828 Presence of other vascular implants and grafts: Secondary | ICD-10-CM

## 2023-09-16 DIAGNOSIS — D0511 Intraductal carcinoma in situ of right breast: Secondary | ICD-10-CM | POA: Diagnosis not present

## 2023-09-16 DIAGNOSIS — C259 Malignant neoplasm of pancreas, unspecified: Secondary | ICD-10-CM | POA: Diagnosis not present

## 2023-09-16 LAB — CMP (CANCER CENTER ONLY)
ALT: 59 U/L — ABNORMAL HIGH (ref 0–44)
AST: 41 U/L (ref 15–41)
Albumin: 3.7 g/dL (ref 3.5–5.0)
Alkaline Phosphatase: 655 U/L — ABNORMAL HIGH (ref 38–126)
Anion gap: 9 (ref 5–15)
BUN: 9 mg/dL (ref 8–23)
CO2: 27 mmol/L (ref 22–32)
Calcium: 8.4 mg/dL — ABNORMAL LOW (ref 8.9–10.3)
Chloride: 106 mmol/L (ref 98–111)
Creatinine: 0.92 mg/dL (ref 0.44–1.00)
GFR, Estimated: >60 mL/min (ref >60)
Glucose, Bld: 178 mg/dL — ABNORMAL HIGH (ref 70–99)
Potassium: 3.3 mmol/L — ABNORMAL LOW (ref 3.5–5.1)
Sodium: 142 mmol/L (ref 135–145)
Total Bilirubin: 0.4 mg/dL (ref 0.0–1.2)
Total Protein: 7.1 g/dL (ref 6.5–8.1)

## 2023-09-16 LAB — CBC WITH DIFFERENTIAL (CANCER CENTER ONLY)
Abs Immature Granulocytes: 1.35 10*3/uL — ABNORMAL HIGH (ref 0.00–0.07)
Basophils Absolute: 0.1 10*3/uL (ref 0.0–0.1)
Basophils Relative: 0 %
Eosinophils Absolute: 0 10*3/uL (ref 0.0–0.5)
Eosinophils Relative: 0 %
HCT: 32.8 % — ABNORMAL LOW (ref 36.0–46.0)
Hemoglobin: 10.9 g/dL — ABNORMAL LOW (ref 12.0–15.0)
Immature Granulocytes: 5 %
Lymphocytes Relative: 13 %
Lymphs Abs: 3.4 10*3/uL (ref 0.7–4.0)
MCH: 30.8 pg (ref 26.0–34.0)
MCHC: 33.2 g/dL (ref 30.0–36.0)
MCV: 92.7 fL (ref 80.0–100.0)
Monocytes Absolute: 1.2 10*3/uL — ABNORMAL HIGH (ref 0.1–1.0)
Monocytes Relative: 4 %
Neutro Abs: 20.8 10*3/uL — ABNORMAL HIGH (ref 1.7–7.7)
Neutrophils Relative %: 78 %
Platelet Count: 256 10*3/uL (ref 150–400)
RBC: 3.54 MIL/uL — ABNORMAL LOW (ref 3.87–5.11)
RDW: 14.7 % (ref 11.5–15.5)
WBC Count: 26.8 10*3/uL — ABNORMAL HIGH (ref 4.0–10.5)
nRBC: 0.5 % — ABNORMAL HIGH (ref 0.0–0.2)

## 2023-09-16 MED ORDER — ATROPINE SULFATE 1 MG/ML IV SOLN
0.5000 mg | Freq: Once | INTRAVENOUS | Status: AC | PRN
  Administered 2023-09-16: 0.5 mg via INTRAVENOUS
  Filled 2023-09-16: qty 1

## 2023-09-16 MED ORDER — DIPHENHYDRAMINE HCL 50 MG/ML IJ SOLN
25.0000 mg | Freq: Once | INTRAMUSCULAR | Status: AC
  Administered 2023-09-16: 25 mg via INTRAVENOUS

## 2023-09-16 MED ORDER — DEXAMETHASONE SODIUM PHOSPHATE 10 MG/ML IJ SOLN
10.0000 mg | Freq: Once | INTRAMUSCULAR | Status: AC
  Administered 2023-09-16: 10 mg via INTRAVENOUS
  Filled 2023-09-16: qty 1

## 2023-09-16 MED ORDER — SODIUM CHLORIDE 0.9% FLUSH
10.0000 mL | Freq: Once | INTRAVENOUS | Status: AC
  Administered 2023-09-16: 10 mL

## 2023-09-16 MED ORDER — METHYLPREDNISOLONE SODIUM SUCC 40 MG IJ SOLR
40.0000 mg | Freq: Once | INTRAMUSCULAR | Status: AC
Start: 2023-09-16 — End: 2023-09-16
  Administered 2023-09-16: 40 mg via INTRAVENOUS

## 2023-09-16 MED ORDER — OXALIPLATIN CHEMO INJECTION 100 MG/20ML
85.0000 mg/m2 | Freq: Once | INTRAVENOUS | Status: AC
  Administered 2023-09-16: 150 mg via INTRAVENOUS
  Filled 2023-09-16: qty 10

## 2023-09-16 MED ORDER — SODIUM CHLORIDE 0.9 % IV SOLN
150.0000 mg | Freq: Once | INTRAVENOUS | Status: AC
  Administered 2023-09-16: 150 mg via INTRAVENOUS
  Filled 2023-09-16: qty 150

## 2023-09-16 MED ORDER — SODIUM CHLORIDE 0.9 % IV SOLN
2400.0000 mg/m2 | INTRAVENOUS | Status: DC
  Administered 2023-09-16: 4350 mg via INTRAVENOUS
  Filled 2023-09-16: qty 87

## 2023-09-16 MED ORDER — SODIUM CHLORIDE 0.9 % IV SOLN
400.0000 mg/m2 | Freq: Once | INTRAVENOUS | Status: AC
  Administered 2023-09-16: 728 mg via INTRAVENOUS
  Filled 2023-09-16: qty 25

## 2023-09-16 MED ORDER — PALONOSETRON HCL INJECTION 0.25 MG/5ML
0.2500 mg | Freq: Once | INTRAVENOUS | Status: AC
  Administered 2023-09-16: 0.25 mg via INTRAVENOUS
  Filled 2023-09-16: qty 5

## 2023-09-16 MED ORDER — FAMOTIDINE IN NACL 20-0.9 MG/50ML-% IV SOLN
20.0000 mg | Freq: Once | INTRAVENOUS | Status: AC
  Administered 2023-09-16: 20 mg via INTRAVENOUS
  Filled 2023-09-16: qty 50

## 2023-09-16 MED ORDER — DIPHENHYDRAMINE HCL 50 MG/ML IJ SOLN
50.0000 mg | Freq: Once | INTRAMUSCULAR | Status: AC
Start: 2023-09-16 — End: 2023-09-16
  Administered 2023-09-16: 50 mg via INTRAVENOUS
  Filled 2023-09-16: qty 1

## 2023-09-16 MED ORDER — SODIUM CHLORIDE 0.9 % IV SOLN
150.0000 mg/m2 | Freq: Once | INTRAVENOUS | Status: AC
  Administered 2023-09-16: 300 mg via INTRAVENOUS
  Filled 2023-09-16: qty 15

## 2023-09-16 MED ORDER — DEXTROSE 5 % IV SOLN
INTRAVENOUS | Status: DC
Start: 2023-09-16 — End: 2023-09-17

## 2023-09-16 NOTE — Progress Notes (Signed)
 Nutrition follow-up completed with patient during infusion for pancreas cancer.  Weight decreased and documented as 157 pounds 12.8 ounces March 25.  This is decreased from 164 pounds February 25.  This is a 4% decrease over 1 month.  Labs include potassium 3.3 and glucose 178.  Patient reports her appetite is decreased and she has early satiety.  She tries not to eat a lot of sweets. She has not had a taste for them recently.  She is experiencing taste alterations and states baking soda and salt water gargles help a little.  Reports recent weight loss was unintentional.  Nutrition diagnosis: Food and nutrition related knowledge deficit, continues.  Intervention: Patient educated to consume small frequent meals and snacks throughout the day.  Provided nutrition facts sheet on suggestions for snacks between meals. Encouraged weight maintenance. Educated on taste alterations and provided nutrition fact sheet. Continue strategies to minimize cold sensitivity.  Monitoring, evaluation, goals: Patient will tolerate adequate calories and protein for weight maintenance.  Next visit: Tuesday, April 22 during infusion.  **Disclaimer: This note was dictated with voice recognition software. Similar sounding words can inadvertently be transcribed and this note may contain transcription errors which may not have been corrected upon publication of note.**

## 2023-09-16 NOTE — Progress Notes (Signed)
 Hypersensitivity Reaction note  Date of event: 09/16/23 Time of event: 1540  Generic name of drug involved: oxaliplatin Name of provider notified of the hypersensitivity reaction: Mosetta Putt Was agent that likely caused hypersensitivity reaction added to Allergies List within EMR? yes Chain of events including reaction signs/symptoms, treatment administered, and outcome (e.g., drug resumed; drug discontinued; sent to Emergency Department; etc.) See [facility] and flowsheet for vital signs and meds given.  Pt returned to infusion after being discharged on 5FU pump c/o SHOB, throat swelling, and feeling anxious.  Dr Mosetta Putt called to bedside and emergency orders given (See MAR)  Pt remained alert and shortly returned to baseline.  After 30 min observation and per Dr Mosetta Putt, pump was restarted, and patient was discharged in care of family.  Trula Ore, RN 09/16/2023 6:55 PM

## 2023-09-17 ENCOUNTER — Other Ambulatory Visit: Payer: Self-pay | Admitting: Internal Medicine

## 2023-09-17 DIAGNOSIS — E039 Hypothyroidism, unspecified: Secondary | ICD-10-CM

## 2023-09-18 ENCOUNTER — Inpatient Hospital Stay: Payer: PPO

## 2023-09-18 VITALS — BP 133/74 | HR 60 | Resp 17

## 2023-09-18 DIAGNOSIS — Z5111 Encounter for antineoplastic chemotherapy: Secondary | ICD-10-CM | POA: Diagnosis not present

## 2023-09-18 DIAGNOSIS — C259 Malignant neoplasm of pancreas, unspecified: Secondary | ICD-10-CM

## 2023-09-18 MED ORDER — HEPARIN SOD (PORK) LOCK FLUSH 100 UNIT/ML IV SOLN
500.0000 [IU] | Freq: Once | INTRAVENOUS | Status: AC | PRN
  Administered 2023-09-18: 500 [IU]

## 2023-09-18 MED ORDER — PEGFILGRASTIM-CBQV 6 MG/0.6ML ~~LOC~~ SOSY
6.0000 mg | PREFILLED_SYRINGE | Freq: Once | SUBCUTANEOUS | Status: AC
  Administered 2023-09-18: 6 mg via SUBCUTANEOUS
  Filled 2023-09-18: qty 0.6

## 2023-09-18 MED ORDER — SODIUM CHLORIDE 0.9% FLUSH
10.0000 mL | INTRAVENOUS | Status: DC | PRN
  Administered 2023-09-18: 10 mL

## 2023-09-22 ENCOUNTER — Telehealth (HOSPITAL_COMMUNITY): Payer: Self-pay | Admitting: *Deleted

## 2023-09-22 ENCOUNTER — Other Ambulatory Visit: Payer: Self-pay | Admitting: Internal Medicine

## 2023-09-22 DIAGNOSIS — I10 Essential (primary) hypertension: Secondary | ICD-10-CM

## 2023-09-22 NOTE — Telephone Encounter (Signed)
 Spoke with patient and she requested to cancel her OP MBS and Esophagram orders at this time for her to focus on her chemo treatments. Patient stated that when she was ready for testing she would contact Dr. Pricilla Riffle for a new order. RKEEL

## 2023-09-29 MED FILL — Fosaprepitant Dimeglumine For IV Infusion 150 MG (Base Eq): INTRAVENOUS | Qty: 5 | Status: AC

## 2023-09-29 NOTE — Assessment & Plan Note (Signed)
 cT2N0M0, stage IB -Patient presented with obstructive jaundice, CT and MRI showed a 2.5 cm mass in the head of the pancreas, with pancreatic duct and biliary duct dilatation.  ERCP was attempted twice but failed due to deformed duodenum -She underwent ERCP and stent placement, EUS with fine-needle biopsy of the pancreatic mass by Dr. Meridee Score on July 08, 2023.  Biopsy confirmed adenocarcinoma. -Due to the tumor invasion to portal vein and SMV, I recommended neoadjuvant chemotherapy FOLFIRINOX, she started on 08/05/2023

## 2023-09-30 ENCOUNTER — Encounter: Payer: Self-pay | Admitting: Cardiology

## 2023-09-30 ENCOUNTER — Inpatient Hospital Stay

## 2023-09-30 ENCOUNTER — Encounter: Payer: Self-pay | Admitting: Hematology

## 2023-09-30 ENCOUNTER — Inpatient Hospital Stay: Attending: Nurse Practitioner | Admitting: Hematology

## 2023-09-30 ENCOUNTER — Ambulatory Visit

## 2023-09-30 ENCOUNTER — Other Ambulatory Visit: Payer: Self-pay

## 2023-09-30 ENCOUNTER — Inpatient Hospital Stay: Attending: Nurse Practitioner

## 2023-09-30 VITALS — BP 150/75 | HR 62 | Temp 98.4°F | Resp 16 | Ht 63.5 in | Wt 157.7 lb

## 2023-09-30 VITALS — BP 124/64 | HR 66 | Temp 98.3°F | Resp 20

## 2023-09-30 DIAGNOSIS — Z79631 Long term (current) use of antimetabolite agent: Secondary | ICD-10-CM | POA: Diagnosis not present

## 2023-09-30 DIAGNOSIS — C250 Malignant neoplasm of head of pancreas: Secondary | ICD-10-CM | POA: Diagnosis not present

## 2023-09-30 DIAGNOSIS — Z79634 Long term (current) use of topoisomerase inhibitor: Secondary | ICD-10-CM | POA: Insufficient documentation

## 2023-09-30 DIAGNOSIS — C259 Malignant neoplasm of pancreas, unspecified: Secondary | ICD-10-CM

## 2023-09-30 DIAGNOSIS — Z95828 Presence of other vascular implants and grafts: Secondary | ICD-10-CM

## 2023-09-30 DIAGNOSIS — Z79630 Long term (current) use of alkylating agent: Secondary | ICD-10-CM | POA: Diagnosis not present

## 2023-09-30 DIAGNOSIS — Z5189 Encounter for other specified aftercare: Secondary | ICD-10-CM | POA: Insufficient documentation

## 2023-09-30 DIAGNOSIS — Z5111 Encounter for antineoplastic chemotherapy: Secondary | ICD-10-CM | POA: Diagnosis not present

## 2023-09-30 LAB — CBC WITH DIFFERENTIAL (CANCER CENTER ONLY)
Abs Immature Granulocytes: 0.83 10*3/uL — ABNORMAL HIGH (ref 0.00–0.07)
Basophils Absolute: 0.1 10*3/uL (ref 0.0–0.1)
Basophils Relative: 0 %
Eosinophils Absolute: 0.1 10*3/uL (ref 0.0–0.5)
Eosinophils Relative: 0 %
HCT: 30 % — ABNORMAL LOW (ref 36.0–46.0)
Hemoglobin: 10 g/dL — ABNORMAL LOW (ref 12.0–15.0)
Immature Granulocytes: 4 %
Lymphocytes Relative: 16 %
Lymphs Abs: 3.6 10*3/uL (ref 0.7–4.0)
MCH: 30.5 pg (ref 26.0–34.0)
MCHC: 33.3 g/dL (ref 30.0–36.0)
MCV: 91.5 fL (ref 80.0–100.0)
Monocytes Absolute: 1.3 10*3/uL — ABNORMAL HIGH (ref 0.1–1.0)
Monocytes Relative: 6 %
Neutro Abs: 16.5 10*3/uL — ABNORMAL HIGH (ref 1.7–7.7)
Neutrophils Relative %: 74 %
Platelet Count: 277 10*3/uL (ref 150–400)
RBC: 3.28 MIL/uL — ABNORMAL LOW (ref 3.87–5.11)
RDW: 15.8 % — ABNORMAL HIGH (ref 11.5–15.5)
WBC Count: 22.3 10*3/uL — ABNORMAL HIGH (ref 4.0–10.5)
nRBC: 0.7 % — ABNORMAL HIGH (ref 0.0–0.2)

## 2023-09-30 LAB — CMP (CANCER CENTER ONLY)
ALT: 90 U/L — ABNORMAL HIGH (ref 0–44)
AST: 69 U/L — ABNORMAL HIGH (ref 15–41)
Albumin: 3.7 g/dL (ref 3.5–5.0)
Alkaline Phosphatase: 761 U/L — ABNORMAL HIGH (ref 38–126)
Anion gap: 8 (ref 5–15)
BUN: 10 mg/dL (ref 8–23)
CO2: 27 mmol/L (ref 22–32)
Calcium: 8.8 mg/dL — ABNORMAL LOW (ref 8.9–10.3)
Chloride: 106 mmol/L (ref 98–111)
Creatinine: 0.94 mg/dL (ref 0.44–1.00)
GFR, Estimated: >60 mL/min (ref >60)
Glucose, Bld: 173 mg/dL — ABNORMAL HIGH (ref 70–99)
Potassium: 3.3 mmol/L — ABNORMAL LOW (ref 3.5–5.1)
Sodium: 141 mmol/L (ref 135–145)
Total Bilirubin: 0.3 mg/dL (ref 0.0–1.2)
Total Protein: 7.1 g/dL (ref 6.5–8.1)

## 2023-09-30 MED ORDER — DIPHENHYDRAMINE HCL 50 MG/ML IJ SOLN
50.0000 mg | Freq: Once | INTRAMUSCULAR | Status: AC
  Administered 2023-09-30: 50 mg via INTRAVENOUS
  Filled 2023-09-30: qty 1

## 2023-09-30 MED ORDER — SODIUM CHLORIDE 0.9 % IV SOLN
2400.0000 mg/m2 | INTRAVENOUS | Status: DC
  Administered 2023-09-30: 4350 mg via INTRAVENOUS
  Filled 2023-09-30: qty 87

## 2023-09-30 MED ORDER — FAMOTIDINE IN NACL 20-0.9 MG/50ML-% IV SOLN
20.0000 mg | Freq: Once | INTRAVENOUS | Status: AC
  Administered 2023-09-30: 20 mg via INTRAVENOUS
  Filled 2023-09-30: qty 50

## 2023-09-30 MED ORDER — ALTEPLASE 2 MG IJ SOLR
2.0000 mg | Freq: Once | INTRAMUSCULAR | Status: AC
Start: 2023-09-30 — End: 2023-09-30
  Administered 2023-09-30: 2 mg
  Filled 2023-09-30: qty 2

## 2023-09-30 MED ORDER — SODIUM CHLORIDE 0.9 % IV SOLN
150.0000 mg | Freq: Once | INTRAVENOUS | Status: AC
  Administered 2023-09-30: 150 mg via INTRAVENOUS
  Filled 2023-09-30: qty 150

## 2023-09-30 MED ORDER — DEXAMETHASONE SODIUM PHOSPHATE 10 MG/ML IJ SOLN
10.0000 mg | Freq: Once | INTRAMUSCULAR | Status: AC
Start: 2023-09-30 — End: 2023-09-30
  Administered 2023-09-30: 10 mg via INTRAVENOUS
  Filled 2023-09-30: qty 1

## 2023-09-30 MED ORDER — PALONOSETRON HCL INJECTION 0.25 MG/5ML
0.2500 mg | Freq: Once | INTRAVENOUS | Status: AC
  Administered 2023-09-30: 0.25 mg via INTRAVENOUS
  Filled 2023-09-30: qty 5

## 2023-09-30 MED ORDER — OXALIPLATIN CHEMO INJECTION 100 MG/20ML
85.0000 mg/m2 | Freq: Once | INTRAVENOUS | Status: AC
  Administered 2023-09-30: 150 mg via INTRAVENOUS
  Filled 2023-09-30: qty 10

## 2023-09-30 MED ORDER — SODIUM CHLORIDE 0.9% FLUSH
10.0000 mL | INTRAVENOUS | Status: DC | PRN
  Administered 2023-09-30: 10 mL

## 2023-09-30 MED ORDER — SODIUM CHLORIDE 0.9 % IV SOLN
400.0000 mg/m2 | Freq: Once | INTRAVENOUS | Status: AC
  Administered 2023-09-30: 728 mg via INTRAVENOUS
  Filled 2023-09-30: qty 17.5

## 2023-09-30 MED ORDER — POTASSIUM CHLORIDE ER 10 MEQ PO TBCR
EXTENDED_RELEASE_TABLET | ORAL | 0 refills | Status: DC
Start: 2023-09-30 — End: 2024-02-24

## 2023-09-30 MED ORDER — SODIUM CHLORIDE 0.9% FLUSH
10.0000 mL | Freq: Once | INTRAVENOUS | Status: AC
  Administered 2023-09-30: 10 mL

## 2023-09-30 MED ORDER — ATROPINE SULFATE 1 MG/ML IV SOLN
0.5000 mg | Freq: Once | INTRAVENOUS | Status: AC | PRN
  Administered 2023-09-30: 0.5 mg via INTRAVENOUS
  Filled 2023-09-30: qty 1

## 2023-09-30 MED ORDER — DEXTROSE 5 % IV SOLN: INTRAVENOUS | Status: DC

## 2023-09-30 MED ORDER — SODIUM CHLORIDE 0.9 % IV SOLN
150.0000 mg/m2 | Freq: Once | INTRAVENOUS | Status: AC
  Administered 2023-09-30: 300 mg via INTRAVENOUS
  Filled 2023-09-30: qty 15

## 2023-09-30 NOTE — Progress Notes (Signed)
 Pt. complained of difficulty opening right eye that is a "new" symptom and "just started". Also complained of hoarse voice. Pt. denies vision changes, no facial droop and bilateral hand grips strong and equal. Bilateral feet dorsi flexion strong and equal. Vital signs stable. Pt. States she did not drink anything cold. Dr. Mosetta Putt notified and in for assessment. Continue with chemotherapy and instructed pt. to notify her if it does not clear in 2 days or if it gets worse. Pt. and family states they understand.

## 2023-09-30 NOTE — Patient Instructions (Signed)
 [facility] - A DEPT OF [facility]  Discharge Instructions: Thank you for choosing [facility] to provide your oncology and hematology care.   If you have a lab appointment with the Cancer Center, please go directly to the Cancer Center and check in at the registration area.   Wear comfortable clothing and clothing appropriate for easy access to any Portacath or PICC line.   We strive to give you quality time with your provider. You may need to reschedule your appointment if you arrive late (15 or more minutes).  Arriving late affects you and other patients whose appointments are after yours.  Also, if you miss three or more appointments without notifying the office, you may be dismissed from the clinic at the provider's discretion.      For prescription refill requests, have your pharmacy contact our office and allow 72 hours for refills to be completed.    Today you received the following chemotherapy and/or immunotherapy agents: Oxaliplatin/Irinotecan/Leucovorin/Fluorouracil      To help prevent nausea and vomiting after your treatment, we encourage you to take your nausea medication as directed.  BELOW ARE SYMPTOMS THAT SHOULD BE REPORTED IMMEDIATELY: *FEVER GREATER THAN 100.4 F (38 C) OR HIGHER *CHILLS OR SWEATING *NAUSEA AND VOMITING THAT IS NOT CONTROLLED WITH YOUR NAUSEA MEDICATION *UNUSUAL SHORTNESS OF BREATH *UNUSUAL BRUISING OR BLEEDING *URINARY PROBLEMS (pain or burning when urinating, or frequent urination) *BOWEL PROBLEMS (unusual diarrhea, constipation, pain near the anus) TENDERNESS IN MOUTH AND THROAT WITH OR WITHOUT PRESENCE OF ULCERS (sore throat, sores in mouth, or a toothache) UNUSUAL RASH, SWELLING OR PAIN  UNUSUAL VAGINAL DISCHARGE OR ITCHING   Items with * indicate a potential emergency and should be followed up as soon as possible or go to the Emergency Department if any problems should occur.  Please show the  CHEMOTHERAPY ALERT CARD or IMMUNOTHERAPY ALERT CARD at check-in to the Emergency Department and triage nurse.  Should you have questions after your visit or need to cancel or reschedule your appointment, please contact [facility] - [facility]  Dept: [phone]  and follow the prompts.  Office hours are 8:00 a.m. to 4:30 p.m. Monday - Friday. Please note that voicemails left after 4:00 p.m. may not be returned until the following business day.  We are closed weekends and major holidays. You have access to a nurse at all times for urgent questions. Please call the main number to the clinic Dept: [phone] and follow the prompts.   For any non-urgent questions, you may also contact your provider using MyChart. We now offer e-Visits for anyone 3 and older to request care online for non-urgent symptoms. For details visit [email or web address].   Also download the MyChart app! Go to the app store, search "MyChart", open the app, select [place], and log in with your MyChart username and password.

## 2023-09-30 NOTE — Progress Notes (Signed)
 Per Dr. Mosetta Putt OK to being premedications prior to labs resulting today d/t delay in blood return from [facility].  Per Dr. Mosetta Putt Oxaliplatin to infuse over 3 hours d/t previous Oxaliplatin rxn.

## 2023-09-30 NOTE — Progress Notes (Signed)
 [facility]   Telephone:(336) [phone] Fax:([phone]) [phone]   Clinic Follow up Note   Patient Care Team: Anabel Halon, MD as PCP - General (Internal Medicine) Rollene Rotunda, MD as PCP - Cardiology (Cardiology) Donnelly Angelica, RN as Oncology Nurse Navigator Pershing Proud, RN as Oncology Nurse Navigator Griselda Miner, MD as Consulting Physician (General Surgery) Malachy Mood, MD as Consulting Physician (Hematology) Lonie Peak, MD as Attending Physician (Radiation Oncology) Erroll Luna, [facility] (Inactive) as Pharmacist (Pharmacist)  Date of Service:  09/30/2023  CHIEF COMPLAINT: f/u of pancreatic cancer  CURRENT THERAPY:  FOLFIRINOX   Oncology History   Pancreatic adenocarcinoma (HCC) cT2N0M0, stage IB -Patient presented with obstructive jaundice, CT and MRI showed a 2.5 cm mass in the head of the pancreas, with pancreatic duct and biliary duct dilatation.  ERCP was attempted twice but failed due to deformed duodenum -She underwent ERCP and stent placement, EUS with fine-needle biopsy of the pancreatic mass by Dr. Meridee Score on July 08, 2023.  Biopsy confirmed adenocarcinoma. -Due to the tumor invasion to portal vein and SMV, I recommended neoadjuvant chemotherapy FOLFIRINOX, she started on 08/05/2023  Assessment & Plan Pancreatic cancer She is undergoing chemotherapy for pancreatic cancer, currently on cycle five of a planned eight-cycle regimen. She experienced a reaction during the last cycle, involving difficulty breathing after drinking room temperature water, managed with Benadryl and warm tea. No nausea or other issues post-chemotherapy. The treatment plan includes continuing the same chemotherapy dose with a longer infusion time for oxaliplatin to mitigate severe reactions. A scan will be performed around the time of the last chemotherapy cycle to evaluate for potential surgery. - Continue chemotherapy regimen for a total of eight cycles - Administer  oxaliplatin with a longer infusion time - Perform a scan around the time of the last chemotherapy cycle to evaluate for potential surgery  Port occlusion She experienced port occlusion, a common occurrence, resolved with medication. This is the first instance of such an issue. - Monitor port function and response to medication  Plan -Lab reviewed, adequate for treatment, will proceed cycle 5 FOLFIRINOX at same dose today.  We have added additional premeds due to her previous reaction.  I also or extend oxaliplatin infusion to 3 hours to reduce risk of reaction.  She knows to drink warm water towards the end of chemotherapy -Follow-up in 2 weeks before cycle 6 chemo, will order restaging CT scan on next visit   SUMMARY OF ONCOLOGIC HISTORY: Oncology History  Ductal carcinoma in situ (DCIS) of right breast  01/28/2019 Mammogram   Diagnostic Mammogram 01/28/19  IMPRESSION: Right breast retroareolar 7 mm grouped pleomorphic calcifications are suspicious.   02/02/2019 Initial Biopsy   Diagnosis 02/02/19 Breast, right, needle core biopsy, outer retroareolar - DUCTAL CARCINOMA IN SITU WITH CALCIFICATIONS. Microscopic Comment The ductal carcinoma in situ is intermediate grade. Estrogen and progesterone receptors will be performed.  Results: IMMUNOHISTOCHEMICAL AND MORPHOMETRIC ANALYSIS PERFORMED MANUALLY Estrogen Receptor: 90%, POSITIVE, STRONG STAINING INTENSITY Progesterone Receptor: 20%, POSITIVE, STRONG STAINING INTENSITY    02/02/2019 Cancer Staging   Staging form: Breast, AJCC 8th Edition - Clinical stage from 02/02/2019: Stage 0 (cTis (DCIS), cN0, cM0, G2, ER+, PR+, HER2: Not Assessed) - Signed by Malachy Mood, MD on 02/10/2019   02/04/2019 Initial Diagnosis   Ductal carcinoma in situ (DCIS) of right breast   03/18/2019 Surgery   RIGHT BREAST LUMPECTOMY WITH RADIOACTIVE SEED LOCALIZATION by Dr. Carolynne Edouard 03/18/19   03/18/2019 Pathology Results   FINAL  MICROSCOPIC DIAGNOSIS: 03/18/19 A. BREAST,  RIGHT, LUMPECTOMY:  - Ductal carcinoma in situ with calcifications and necrosis, 1.4 cm.  - Ductal carcinoma in situ focally 0.1 cm from medial and anterior  lumpectomy margins.  - Ductal carcinoma in situ involves the final anterior margin (See part  D).  - Ductal carcinoma in situ focally 0.3 cm from superior lumpectomy  margin.  - Biopsy site and biopsy clip.   B. BREAST, RIGHT LATERAL MARGIN, EXCISION:  - Benign breast tissue.  - No ductal carcinoma in situ.  - Final lateral margin greater than 1 cm.   C. BREAST, RIGHT MEDIAL MARGIN, EXCISION:  - Ductal carcinoma in situ, 0.3 cm.  - Ductal carcinoma in situ with focally 0.3 cm from final medial margin.   D. BREAST, RIGHT ANTERIOR MARGIN, EXCISION:  - Ductal carcinoma in situ, 1.5 cm.  - Ductal carcinoma in situ focally involves final anterior margin.   E. BREAST, RIGHT DEEP MARGIN, EXCISION:  - Fibrocystic changes.  - No ductal carcinoma in situ.  - Final posterior margin greater than 0.7 cm.    04/08/2019 Surgery   RE-EXCISION OF RIGHT BREAST ANTERIOR MARGINS by Dr Carolynne Edouard 04/08/19   04/08/2019 Pathology Results    DIAGNOSIS: 04/08/19  A. BREAST, RIGHT, ANTERIOR SUPERIOR, EXCISION:  - Intermediate grade ductal carcinoma in situ with necrosis.  - In situ carcinoma is <1 mm (not on ink) from the new anterior superior  margin, multifocally.  - Intraductal papilloma.  - Fibrocystic change.   B. BREAST, RIGHT, ANTERIOR INFERIOR, EXCISION:  - Benign breast tissue with resection changes.   C. BREAST, RIGHT, MEDIAL, EXCISION:  - Benign breast tissue with resection changes.  - Fibroadenomatoid and fibrocystic changes.   05/06/2019 Mammogram   Right Mammogram 05/06/19 IMPRESSION: Suspicious finding with 2 groups of microcalcifications in the medial right breast. The anterior group measures 2 x 2 x 2 mm. The more posterior group measures 4 x 3 x 4 mm.   05/12/2019 Pathology Results   Diagnosis 05/12/19 1. Breast,  right, needle core biopsy, medial - DUCTAL CARCINOMA IN SITU WITH CALCIFICATIONS. - SEE MICROSCOPIC DESCRIPTION. 2. Breast, right, needle core biopsy, medial - DUCTAL CARCINOMA IN SITU WITH CALCIFICATIONS. - SEE MICROSCOPIC DESCRIPTION. Results: IMMUNOHISTOCHEMICAL AND MORPHOMETRIC ANALYSIS PERFORMED MANUALLY Estrogen Receptor: 100%, POSITIVE, STRONG STAINING INTENSITY Progesterone Receptor: 0%, NEGATIVE COMMENT: The negative hormone receptor study(ies) in this case has an internal positive control. Results: IMMUNOHISTOCHEMICAL AND MORPHOMETRIC ANALYSIS PERFORMED MANUALLY Estrogen Receptor: 100%, POSITIVE, STRONG STAINING INTENSITY Progesterone Receptor: 0%, NEGATIVE COMMENT: The negative hormone receptor study(ies) in this case has an internal positive control.   07/26/2019 Surgery   RIGHT MASTECTOMY WITH SENTINEL NODE BIOPSY by Dr. Carolynne Edouard 07/26/19   07/26/2019 Pathology Results   FINAL MICROSCOPIC DIAGNOSIS: 07/26/19  A. LYMPH NODE, RIGHT #1, SENTINEL,  BIOPSY:  -  No carcinoma identified in one lymph node (0/1)   B. BREAST, RIGHT, MASTECTOMY:  -  Ductal carcinoma in situ, intermediate grade, 0.6 cm  -  Margins uninvolved by carcinoma (greater than 1 cm; posterior margin)  -  Previous biopsy site changes (x2)  -  See oncology table below   07/26/2019 Cancer Staging   Staging form: Breast, AJCC 8th Edition - Pathologic stage from 07/26/2019: Stage 0 (pTis (DCIS), pN0, cM0, G2, ER+, PR+, HER2: Not Assessed) - Signed by Malachy Mood, MD on 08/10/2019   08/15/2023 Genetic Testing   Negative [facility] CancerNext-Expanded +RNAinsight Panel.  VUS in ATM at c.4910-891G>A and SDHA at c.-1C>G.  Report date is 08/15/2023.   The CancerNext-Expanded gene panel offered by [facility] and includes sequencing, rearrangement, and RNA analysis for the following 76 genes: AIP, ALK, APC, ATM, AXIN2, BAP1, BARD1, BMPR1A, BRCA1, BRCA2, BRIP1, CDC73, CDH1, CDK4, CDKN1B, CDKN2A, CEBPA, CHEK2, CTNNA1, DDX41, DICER1,  ETV6, FH, FLCN, GATA2, LZTR1, MAX, MBD4, MEN1, MET, MLH1, MSH2, MSH3, MSH6, MUTYH, NF1, NF2, NTHL1, PALB2, PHOX2B, PMS2, POT1, PRKAR1A, PTCH1, PTEN, RAD51C, RAD51D, RB1, RET, RUNX1, SDHA, SDHAF2, SDHB, SDHC, SDHD, SMAD4, SMARCA4, SMARCB1, SMARCE1, STK11, SUFU, TMEM127, TP53, TSC1, TSC2, VHL, and WT1 (sequencing and deletion/duplication); EGFR, HOXB13, KIT, MITF, PDGFRA, POLD1, and POLE (sequencing only); EPCAM and GREM1 (deletion/duplication only).    Pancreatic adenocarcinoma (HCC)  07/07/2022 Cancer Staging   Staging form: Exocrine Pancreas, AJCC 8th Edition - Clinical stage from 07/07/2022: Stage IB (cT2, cN0, cM0) - Signed by Malachy Mood, MD on 07/22/2023 Stage prefix: Initial diagnosis Total positive nodes: 0   06/13/2023 Initial Diagnosis   Pancreatic adenocarcinoma (HCC)   08/06/2023 -  Chemotherapy   Patient is on Treatment Plan : PANCREAS Modified FOLFIRINOX q14d x 4 cycles     08/15/2023 Genetic Testing   Negative [facility] CancerNext-Expanded +RNAinsight Panel.  VUS in ATM at c.4910-891G>A and SDHA at c.-1C>G.  Report date is 08/15/2023.   The CancerNext-Expanded gene panel offered by [facility] and includes sequencing, rearrangement, and RNA analysis for the following 76 genes: AIP, ALK, APC, ATM, AXIN2, BAP1, BARD1, BMPR1A, BRCA1, BRCA2, BRIP1, CDC73, CDH1, CDK4, CDKN1B, CDKN2A, CEBPA, CHEK2, CTNNA1, DDX41, DICER1, ETV6, FH, FLCN, GATA2, LZTR1, MAX, MBD4, MEN1, MET, MLH1, MSH2, MSH3, MSH6, MUTYH, NF1, NF2, NTHL1, PALB2, PHOX2B, PMS2, POT1, PRKAR1A, PTCH1, PTEN, RAD51C, RAD51D, RB1, RET, RUNX1, SDHA, SDHAF2, SDHB, SDHC, SDHD, SMAD4, SMARCA4, SMARCB1, SMARCE1, STK11, SUFU, TMEM127, TP53, TSC1, TSC2, VHL, and WT1 (sequencing and deletion/duplication); EGFR, HOXB13, KIT, MITF, PDGFRA, POLD1, and POLE (sequencing only); EPCAM and GREM1 (deletion/duplication only).       Discussed the use of AI scribe software for clinical note transcription with the patient, who gave verbal consent to  proceed.  History of Present Illness A 78 year old patient with a known diagnosis of pancreatic cancer presents for a follow-up visit. The patient reports an issue with her port during the last treatment cycle, which required intervention for flushing. This was the first time the patient experienced such a problem with the port. During the last cycle of chemotherapy, the patient experienced a reaction causing breathlessness. This occurred after drinking room temperature water at the end of the treatment session, just before leaving the facility. The patient had to return to the treatment room, where symptoms were managed with Benadryl and warm tea. The patient denies any other adverse effects from the chemotherapy, such as nausea or tiredness.     All other systems were reviewed with the patient and are negative.  MEDICAL HISTORY:  Past Medical History:  Diagnosis Date   Allergy    Ambulates with cane    straight cane   Arthritis    knee, back   CHF (congestive heart failure) (HCC)    Ductal carcinoma in situ of breast 12/2018   R Breast-mastectomy only   Gout    History of blood transfusion 1986   w/ Hysterectomy surgery   Hyperlipidemia    diet controlled - no meds   Hypertension    Hypothyroidism    Pelvic kidney    lower right pelvic kidney    SBO (small bowel obstruction) (HCC) 10/2016   surgery  Sleep apnea    Mild - no mask needed per sleep study   Smoker    quit smoking 2018   Type 2 diabetes mellitus (HCC)     SURGICAL HISTORY: Past Surgical History:  Procedure Laterality Date   ABDOMINAL HYSTERECTOMY  1986   COMPLETE-precancerous   APPENDECTOMY     pt states it was removed when gallbladder was removed.   AUGMENTATION MAMMAPLASTY     BACK SURGERY     lower back   BILIARY BRUSHING  07/08/2023   Procedure: BILIARY BRUSHING;  Surgeon: Meridee Score Netty Starring., MD;  Location: [facility];  Service: Gastroenterology;;   BILIARY STENT PLACEMENT N/A 07/08/2023    Procedure: BILIARY STENT PLACEMENT;  Surgeon: Lemar Lofty., MD;  Location: [facility];  Service: Gastroenterology;  Laterality: N/A;   BIOPSY  06/14/2023   Procedure: BIOPSY;  Surgeon: Iva Boop, MD;  Location: [facility];  Service: Gastroenterology;;   BIOPSY  07/08/2023   Procedure: BIOPSY;  Surgeon: Lemar Lofty., MD;  Location: [facility];  Service: Gastroenterology;;   BREAST BIOPSY Right 05/12/2019   times 2   BREAST LUMPECTOMY WITH RADIOACTIVE SEED LOCALIZATION Right 03/18/2019   Procedure: RIGHT BREAST LUMPECTOMY WITH RADIOACTIVE SEED LOCALIZATION;  Surgeon: Griselda Miner, MD;  Location: [facility];  Service: General;  Laterality: Right;   CHOLECYSTECTOMY     COLONOSCOPY  02/15/2008   [facility]    COLONOSCOPY WITH PROPOFOL  02/27/2018   Dr.Nandigam   ECTOPIC PREGNANCY SURGERY     ERCP N/A 06/14/2023   Procedure: ENDOSCOPIC RETROGRADE CHOLANGIOPANCREATOGRAPHY (ERCP);  Surgeon: Iva Boop, MD;  Location: [facility];  Service: Gastroenterology;  Laterality: N/A;   ERCP N/A 06/27/2023   Procedure: ENDOSCOPIC RETROGRADE CHOLANGIOPANCREATOGRAPHY (ERCP);  Surgeon: Lynann Bologna, MD;  Location: [facility];  Service: Gastroenterology;  Laterality: N/A;   ERCP N/A 07/08/2023   Procedure: ENDOSCOPIC RETROGRADE CHOLANGIOPANCREATOGRAPHY (ERCP);  Surgeon: Lemar Lofty., MD;  Location: [facility];  Service: Gastroenterology;  Laterality: N/A;   ESOPHAGOGASTRODUODENOSCOPY N/A 07/08/2023   Procedure: ESOPHAGOGASTRODUODENOSCOPY (EGD);  Surgeon: Lemar Lofty., MD;  Location: [facility];  Service: Gastroenterology;  Laterality: N/A;   ESOPHAGOGASTRODUODENOSCOPY (EGD) WITH PROPOFOL N/A 06/14/2023   Procedure: ESOPHAGOGASTRODUODENOSCOPY (EGD) WITH PROPOFOL;  Surgeon: Iva Boop, MD;  Location: [facility];  Service: Gastroenterology;  Laterality: N/A;   EUS N/A 07/08/2023   Procedure: UPPER ENDOSCOPIC ULTRASOUND (EUS) LINEAR;  Surgeon:  Lemar Lofty., MD;  Location: [facility];  Service: Gastroenterology;  Laterality: N/A;   FINE NEEDLE ASPIRATION N/A 07/08/2023   Procedure: FINE NEEDLE ASPIRATION (FNA) LINEAR;  Surgeon: Lemar Lofty., MD;  Location: [facility];  Service: Gastroenterology;  Laterality: N/A;   IR IMAGING GUIDED PORT INSERTION  07/29/2023   JOINT REPLACEMENT     Left hip total Dr. Lequita Halt 10-01-17   MALONEY DILATION  06/14/2023   Procedure: Elease Hashimoto DILATION;  Surgeon: Iva Boop, MD;  Location: [facility];  Service: Gastroenterology;;   MASTECTOMY Right 07/26/2019   PANCREATIC STENT PLACEMENT  06/14/2023   Procedure: PANCREATIC STENT PLACEMENT;  Surgeon: Iva Boop, MD;  Location: [facility];  Service: Gastroenterology;;   PANCREATIC STENT PLACEMENT  06/27/2023   Procedure: PANCREATIC STENT PLACEMENT;  Surgeon: Lynann Bologna, MD;  Location: [facility];  Service: Gastroenterology;;   POLYPECTOMY     polypectomy-oropharynx     RE-EXCISION OF BREAST CANCER,SUPERIOR MARGINS Right 04/08/2019   Procedure: RE-EXCISION OF RIGHT BREAST ANTERIOR MARGINS;  Surgeon: Griselda Miner, MD;  Location: [facility];  Service: General;  Laterality: Right;   right knee meniscus     SBO  10/2016   small bowel obstruction   SIMPLE MASTECTOMY WITH AXILLARY SENTINEL NODE BIOPSY Right 07/26/2019   Procedure: RIGHT MASTECTOMY WITH SENTINEL NODE BIOPSY;  Surgeon: Griselda Miner, MD;  Location: [facility];  Service: General;  Laterality: Right;   SPHINCTEROTOMY  06/27/2023   Procedure: SPHINCTEROTOMY;  Surgeon: Lynann Bologna, MD;  Location: [facility];  Service: Gastroenterology;;   Dennison Mascot  07/08/2023   Procedure: Dennison Mascot;  Surgeon: Lemar Lofty., MD;  Location: [facility];  Service: Gastroenterology;;   TOTAL HIP ARTHROPLASTY Left 10/01/2017   Procedure: LEFT  TOTAL HIP ARTHROPLASTY ANTERIOR APPROACH;  Surgeon: Ollen Gross, MD;  Location: [facility];  Service:  Orthopedics;  Laterality: Left;   TOTAL HIP ARTHROPLASTY Right 03/11/2018   Procedure: RIGHT TOTAL HIP ARTHROPLASTY ANTERIOR APPROACH;  Surgeon: Ollen Gross, MD;  Location: [facility];  Service: Orthopedics;  Laterality: Right;    UPPER GASTROINTESTINAL ENDOSCOPY      I have reviewed the social history and family history with the patient and they are unchanged from previous note.  ALLERGIES:  is allergic to lisinopril, oxaliplatin, statins, and atorvastatin.  MEDICATIONS:  Current Outpatient Medications  Medication Sig Dispense Refill   Cholecalciferol (VITAMIN D-3) 125 MCG (5000 UT) TABS Take 1 tablet by mouth daily.     famotidine (PEPCID) 20 MG tablet Take 1 tablet (20 mg total) by mouth 2 (two) times daily. (Patient taking differently: Take 20 mg by mouth daily.) 30 tablet 1   fluticasone (FLONASE) 50 MCG/ACT nasal spray Place 2 sprays into both nostrils daily. 16 g 6   Glucose Blood (BLOOD GLUCOSE TEST STRIPS) STRP 1 each by In Vitro route in the morning, at noon, and at bedtime. May substitute to any manufacturer covered by patient's insurance. 100 strip 5   insulin aspart (NOVOLOG) 100 UNIT/ML FlexPen Inject 0-6 Units into the skin 3 (three) times daily with meals. Check Blood Glucose (BG) and inject per scale: BG <150= 0 unit; BG 150-200= 1 unit; BG 201-250= 2 unit; BG 251-300= 3 unit; BG 301-350= 4 unit; BG 351-400= 5 unit; BG >400= 6 unit and Call Primary Care. 15 mL 0   Insulin Glargine (BASAGLAR KWIKPEN) 100 UNIT/ML Inject 12 Units into the skin daily. May substitute as needed per insurance. (Patient taking differently: Inject 12 Units into the skin at bedtime.) 15 mL 2   Insulin Pen Needle (PEN NEEDLES) 31G X 5 MM MISC 1 each by Does not apply route 3 (three) times daily. May dispense any manufacturer covered by patient's insurance. 100 each 0   Lancets (ONETOUCH DELICA PLUS LANCET33G) MISC 1 each by Does not apply route 3 (three) times daily. 100 each 0   lidocaine-prilocaine  (EMLA) cream Apply 1 Application topically as needed. 30 g 2   losartan (COZAAR) 100 MG tablet Take 1 tablet by mouth once daily 90 tablet 0   NIFEdipine (ADALAT CC) 60 MG 24 hr tablet Take 1 tablet by mouth once daily 30 tablet 0   NP THYROID 90 MG tablet Take 1 tablet by mouth once daily 30 tablet 0   ondansetron (ZOFRAN) 8 MG tablet Take 1 tablet (8 mg total) by mouth every 8 (eight) hours as needed for nausea or vomiting. 20 tablet 1   potassium chloride (KLOR-CON) 10 MEQ tablet Take 1 tablet (10 mEq total) by mouth daily. 30 tablet 1   prochlorperazine (COMPAZINE) 10 MG tablet  Take 1 tablet (10 mg total) by mouth every 6 (six) hours as needed for nausea or vomiting. 30 tablet 2   No current facility-administered medications for this visit.   Facility-Administered Medications Ordered in Other Visits  Medication Dose Route Frequency Provider Last Rate Last Admin   dextrose 5 % solution   Intravenous Continuous Malachy Mood, MD 10 mL/hr at 09/30/23 1033 New Bag at 09/30/23 1033   diphenhydrAMINE (BENADRYL) injection 50 mg  50 mg Intravenous Once Malachy Mood, MD       famotidine (PEPCID) IVPB 20 mg premix  20 mg Intravenous Once Malachy Mood, MD       fosaprepitant (EMEND) 150 mg in sodium chloride 0.9 % 145 mL IVPB  150 mg Intravenous Once Malachy Mood, MD 450 mL/hr at 09/30/23 1045 150 mg at 09/30/23 1045   sodium chloride flush (NS) 0.9 % injection 10 mL  10 mL Intracatheter PRN Malachy Mood, MD        PHYSICAL EXAMINATION: ECOG PERFORMANCE STATUS: 1 - Symptomatic but completely ambulatory  Vitals:   09/30/23 0951  BP: (!) 150/75  Pulse: 62  Resp: 16  Temp: 98.4 F (36.9 C)  SpO2: 98%   Wt Readings from Last 3 Encounters:  09/30/23 157 lb 11.2 oz (71.5 kg)  09/16/23 157 lb 12.8 oz (71.6 kg)  09/02/23 161 lb 3.2 oz (73.1 kg)     GENERAL:alert, no distress and comfortable SKIN: skin color, texture, turgor are normal, no rashes or significant lesions EYES: normal, Conjunctiva are pink and  non-injected, sclera clear NECK: supple, thyroid normal size, non-tender, without nodularity LYMPH:  no palpable lymphadenopathy in the cervical, axillary  LUNGS: clear to auscultation and percussion with normal breathing effort HEART: regular rate & rhythm and no murmurs and no lower extremity edema ABDOMEN:abdomen soft, non-tender and normal bowel sounds Musculoskeletal:no cyanosis of digits and no clubbing  NEURO: alert & oriented x 3 with fluent speech, no focal motor/sensory deficits  Physical Exam    LABORATORY DATA:  I have reviewed the data as listed    Latest Ref Rng & Units 09/30/2023    9:59 AM 09/16/2023   10:06 AM 09/02/2023   10:16 AM  CBC  WBC 4.0 - 10.5 K/uL 22.3  26.8  4.1   Hemoglobin 12.0 - 15.0 g/dL 16.1  09.6  04.5   Hematocrit 36.0 - 46.0 % 30.0  32.8  30.0   Platelets 150 - 400 K/uL 277  256  357         Latest Ref Rng & Units 09/16/2023   10:06 AM 09/02/2023   10:16 AM 08/19/2023    9:05 AM  CMP  Glucose 70 - 99 mg/dL 409  811  914   BUN 8 - 23 mg/dL 9  10  9    Creatinine 0.44 - 1.00 mg/dL 7.82  9.56  2.13   Sodium 135 - 145 mmol/L 142  141  142   Potassium 3.5 - 5.1 mmol/L 3.3  3.6  3.2   Chloride 98 - 111 mmol/L 106  106  108   CO2 22 - 32 mmol/L 27  27  26    Calcium 8.9 - 10.3 mg/dL 8.4  8.4  8.4   Total Protein 6.5 - 8.1 g/dL 7.1  6.6  6.5   Total Bilirubin 0.0 - 1.2 mg/dL 0.4  0.5  0.6   Alkaline Phos 38 - 126 U/L 655  414  231   AST 15 - 41 U/L 41  32  26   ALT 0 - 44 U/L 59  57  27       RADIOGRAPHIC STUDIES: I have personally reviewed the radiological images as listed and agreed with the findings in the report. No results found.    Orders Placed This Encounter  Procedures   CBC with Differential (Cancer Center Only)    Standing Status:   Future    Expected Date:   10/28/2023    Expiration Date:   10/27/2024   CMP (Cancer Center only)    Standing Status:   Future    Expected Date:   10/28/2023    Expiration Date:   10/27/2024   CBC  with Differential (Cancer Center Only)    Standing Status:   Future    Expected Date:   11/11/2023    Expiration Date:   11/10/2024   CMP (Cancer Center only)    Standing Status:   Future    Expected Date:   11/11/2023    Expiration Date:   11/10/2024   All questions were answered. The patient knows to call the clinic with any problems, questions or concerns. No barriers to learning was detected. The total time spent in the appointment was 30 minutes.     Malachy Mood, MD 09/30/2023

## 2023-09-30 NOTE — Progress Notes (Signed)
 Per Dr. Mosetta Putt OK to proceed with tx today with elevated ALT 90.

## 2023-09-30 NOTE — Progress Notes (Signed)
 Per Dr. Mosetta Putt: ok to start premeds while labs pending.  Oxaliplatin to inf over 3 hrs.  Ebony Hail, Pharm.D., CPP 09/30/2023@10 :30 AM

## 2023-10-01 ENCOUNTER — Telehealth: Payer: Self-pay

## 2023-10-01 NOTE — Telephone Encounter (Signed)
 Patient was identified as falling into the [place] Measure - Diabetes.   Patient was: Appointment already scheduled for:  10/21/23.

## 2023-10-02 ENCOUNTER — Inpatient Hospital Stay

## 2023-10-02 VITALS — BP 150/84 | HR 60 | Temp 98.2°F | Resp 18

## 2023-10-02 DIAGNOSIS — C259 Malignant neoplasm of pancreas, unspecified: Secondary | ICD-10-CM

## 2023-10-02 DIAGNOSIS — Z5111 Encounter for antineoplastic chemotherapy: Secondary | ICD-10-CM | POA: Diagnosis not present

## 2023-10-02 MED ORDER — SODIUM CHLORIDE 0.9% FLUSH
10.0000 mL | INTRAVENOUS | Status: DC | PRN
Start: 2023-10-02 — End: 2023-10-02

## 2023-10-02 MED ORDER — HEPARIN SOD (PORK) LOCK FLUSH 100 UNIT/ML IV SOLN
500.0000 [IU] | Freq: Once | INTRAVENOUS | Status: DC | PRN
Start: 2023-10-02 — End: 2023-10-02

## 2023-10-02 MED ORDER — PEGFILGRASTIM-CBQV 6 MG/0.6ML ~~LOC~~ SOSY
6.0000 mg | PREFILLED_SYRINGE | Freq: Once | SUBCUTANEOUS | Status: AC
  Administered 2023-10-02: 6 mg via SUBCUTANEOUS

## 2023-10-04 DIAGNOSIS — C259 Malignant neoplasm of pancreas, unspecified: Secondary | ICD-10-CM | POA: Diagnosis not present

## 2023-10-13 ENCOUNTER — Encounter: Payer: Self-pay | Admitting: Internal Medicine

## 2023-10-13 MED FILL — Fosaprepitant Dimeglumine For IV Infusion 150 MG (Base Eq): INTRAVENOUS | Qty: 5 | Status: AC

## 2023-10-13 NOTE — Progress Notes (Signed)
 Patient Care Team: [name], MD as PCP - General (Internal Medicine) Eilleen Grates, MD as PCP - Cardiology (Cardiology) Alane Hsu, RN as Oncology Nurse Navigator Auther Bo, RN as Oncology Nurse Navigator Caralyn Chandler, MD as Consulting Physician (General Surgery) Sonja [place], MD as Consulting Physician (Hematology) Colie Dawes, MD as Attending Physician (Radiation Oncology) Myrle Aspen, [facility] (Inactive) as Pharmacist (Pharmacist)   CHIEF COMPLAINT: Follow up pancreatic cancer   Oncology History  Ductal carcinoma in situ (DCIS) of right breast  01/28/2019 Mammogram   Diagnostic Mammogram 01/28/19  IMPRESSION: Right breast retroareolar 7 mm grouped pleomorphic calcifications are suspicious.   02/02/2019 Initial Biopsy   Diagnosis 02/02/19 Breast, right, needle core biopsy, outer retroareolar - DUCTAL CARCINOMA IN SITU WITH CALCIFICATIONS. Microscopic Comment The ductal carcinoma in situ is intermediate grade. Estrogen and progesterone receptors will be performed.  Results: IMMUNOHISTOCHEMICAL AND MORPHOMETRIC ANALYSIS PERFORMED MANUALLY Estrogen Receptor: 90%, POSITIVE, STRONG STAINING INTENSITY Progesterone Receptor: 20%, POSITIVE, STRONG STAINING INTENSITY    02/02/2019 Cancer Staging   Staging form: Breast, AJCC 8th Edition - Clinical stage from 02/02/2019: Stage 0 (cTis (DCIS), cN0, cM0, G2, ER+, PR+, HER2: Not Assessed) - Signed by Sonja [place], MD on 02/10/2019   02/04/2019 Initial Diagnosis   Ductal carcinoma in situ (DCIS) of right breast   03/18/2019 Surgery   RIGHT BREAST LUMPECTOMY WITH RADIOACTIVE SEED LOCALIZATION by Dr. Alethea Andes 03/18/19   03/18/2019 Pathology Results   FINAL MICROSCOPIC DIAGNOSIS: 03/18/19 A. BREAST, RIGHT, LUMPECTOMY:  - Ductal carcinoma in situ with calcifications and necrosis, 1.4 cm.  - Ductal carcinoma in situ focally 0.1 cm from medial and anterior  lumpectomy margins.  - Ductal carcinoma in situ involves the final  anterior margin (See part  D).  - Ductal carcinoma in situ focally 0.3 cm from superior lumpectomy  margin.  - Biopsy site and biopsy clip.   B. BREAST, RIGHT LATERAL MARGIN, EXCISION:  - Benign breast tissue.  - No ductal carcinoma in situ.  - Final lateral margin greater than 1 cm.   C. BREAST, RIGHT MEDIAL MARGIN, EXCISION:  - Ductal carcinoma in situ, 0.3 cm.  - Ductal carcinoma in situ with focally 0.3 cm from final medial margin.   D. BREAST, RIGHT ANTERIOR MARGIN, EXCISION:  - Ductal carcinoma in situ, 1.5 cm.  - Ductal carcinoma in situ focally involves final anterior margin.   E. BREAST, RIGHT DEEP MARGIN, EXCISION:  - Fibrocystic changes.  - No ductal carcinoma in situ.  - Final posterior margin greater than 0.7 cm.    04/08/2019 Surgery   RE-EXCISION OF RIGHT BREAST ANTERIOR MARGINS by Dr Alethea Andes 04/08/19   04/08/2019 Pathology Results    DIAGNOSIS: 04/08/19  A. BREAST, RIGHT, ANTERIOR SUPERIOR, EXCISION:  - Intermediate grade ductal carcinoma in situ with necrosis.  - In situ carcinoma is <1 mm (not on ink) from the new anterior superior  margin, multifocally.  - Intraductal papilloma.  - Fibrocystic change.   B. BREAST, RIGHT, ANTERIOR INFERIOR, EXCISION:  - Benign breast tissue with resection changes.   C. BREAST, RIGHT, MEDIAL, EXCISION:  - Benign breast tissue with resection changes.  - Fibroadenomatoid and fibrocystic changes.   05/06/2019 Mammogram   Right Mammogram 05/06/19 IMPRESSION: Suspicious finding with 2 groups of microcalcifications in the medial right breast. The anterior group measures 2 x 2 x 2 mm. The more posterior group measures 4 x 3 x 4 mm.   05/12/2019 Pathology Results   Diagnosis 05/12/19 1.  Breast, right, needle core biopsy, medial - DUCTAL CARCINOMA IN SITU WITH CALCIFICATIONS. - SEE MICROSCOPIC DESCRIPTION. 2. Breast, right, needle core biopsy, medial - DUCTAL CARCINOMA IN SITU WITH CALCIFICATIONS. - SEE MICROSCOPIC  DESCRIPTION. Results: IMMUNOHISTOCHEMICAL AND MORPHOMETRIC ANALYSIS PERFORMED MANUALLY Estrogen Receptor: 100%, POSITIVE, STRONG STAINING INTENSITY Progesterone Receptor: 0%, NEGATIVE COMMENT: The negative hormone receptor study(ies) in this case has an internal positive control. Results: IMMUNOHISTOCHEMICAL AND MORPHOMETRIC ANALYSIS PERFORMED MANUALLY Estrogen Receptor: 100%, POSITIVE, STRONG STAINING INTENSITY Progesterone Receptor: 0%, NEGATIVE COMMENT: The negative hormone receptor study(ies) in this case has an internal positive control.   07/26/2019 Surgery   RIGHT MASTECTOMY WITH SENTINEL NODE BIOPSY by Dr. Alethea Andes 07/26/19   07/26/2019 Pathology Results   FINAL MICROSCOPIC DIAGNOSIS: 07/26/19  A. LYMPH NODE, RIGHT #1, SENTINEL,  BIOPSY:  -  No carcinoma identified in one lymph node (0/1)   B. BREAST, RIGHT, MASTECTOMY:  -  Ductal carcinoma in situ, intermediate grade, 0.6 cm  -  Margins uninvolved by carcinoma (greater than 1 cm; posterior margin)  -  Previous biopsy site changes (x2)  -  See oncology table below   07/26/2019 Cancer Staging   Staging form: Breast, AJCC 8th Edition - Pathologic stage from 07/26/2019: Stage 0 (pTis (DCIS), pN0, cM0, G2, ER+, PR+, HER2: Not Assessed) - Signed by Sonja [place], MD on 08/10/2019   08/15/2023 Genetic Testing   Negative [facility] CancerNext-Expanded +RNAinsight Panel.  VUS in ATM at c.4910-891G>A and SDHA at c.-1C>G.  Report date is 08/15/2023.   The CancerNext-Expanded gene panel offered by [facility] and includes sequencing, rearrangement, and RNA analysis for the following 76 genes: AIP, ALK, APC, ATM, AXIN2, BAP1, BARD1, BMPR1A, BRCA1, BRCA2, BRIP1, CDC73, CDH1, CDK4, CDKN1B, CDKN2A, CEBPA, CHEK2, CTNNA1, DDX41, DICER1, ETV6, FH, FLCN, GATA2, LZTR1, MAX, MBD4, MEN1, MET, MLH1, MSH2, MSH3, MSH6, MUTYH, NF1, NF2, NTHL1, PALB2, PHOX2B, PMS2, POT1, PRKAR1A, PTCH1, PTEN, RAD51C, RAD51D, RB1, RET, RUNX1, SDHA, SDHAF2, SDHB, SDHC, SDHD, SMAD4, SMARCA4,  SMARCB1, SMARCE1, STK11, SUFU, TMEM127, TP53, TSC1, TSC2, VHL, and WT1 (sequencing and deletion/duplication); EGFR, HOXB13, KIT, MITF, PDGFRA, POLD1, and POLE (sequencing only); EPCAM and GREM1 (deletion/duplication only).    Pancreatic adenocarcinoma (HCC)  07/07/2022 Cancer Staging   Staging form: Exocrine Pancreas, AJCC 8th Edition - Clinical stage from 07/07/2022: Stage IB (cT2, cN0, cM0) - Signed by Sonja [place], MD on 07/22/2023 Stage prefix: Initial diagnosis Total positive nodes: 0   06/13/2023 Initial Diagnosis   Pancreatic adenocarcinoma (HCC)   08/06/2023 -  Chemotherapy   Patient is on Treatment Plan : PANCREAS Modified FOLFIRINOX q14d x 4 cycles     08/15/2023 Genetic Testing   Negative [facility] CancerNext-Expanded +RNAinsight Panel.  VUS in ATM at c.4910-891G>A and SDHA at c.-1C>G.  Report date is 08/15/2023.   The CancerNext-Expanded gene panel offered by [facility] and includes sequencing, rearrangement, and RNA analysis for the following 76 genes: AIP, ALK, APC, ATM, AXIN2, BAP1, BARD1, BMPR1A, BRCA1, BRCA2, BRIP1, CDC73, CDH1, CDK4, CDKN1B, CDKN2A, CEBPA, CHEK2, CTNNA1, DDX41, DICER1, ETV6, FH, FLCN, GATA2, LZTR1, MAX, MBD4, MEN1, MET, MLH1, MSH2, MSH3, MSH6, MUTYH, NF1, NF2, NTHL1, PALB2, PHOX2B, PMS2, POT1, PRKAR1A, PTCH1, PTEN, RAD51C, RAD51D, RB1, RET, RUNX1, SDHA, SDHAF2, SDHB, SDHC, SDHD, SMAD4, SMARCA4, SMARCB1, SMARCE1, STK11, SUFU, TMEM127, TP53, TSC1, TSC2, VHL, and WT1 (sequencing and deletion/duplication); EGFR, HOXB13, KIT, MITF, PDGFRA, POLD1, and POLE (sequencing only); EPCAM and GREM1 (deletion/duplication only).       CURRENT THERAPY: Neoadjuvant FOLFIRINOX q14 days x8 cycles   INTERVAL HISTORY Adrienne [name] returns  for follow up and treatment as scheduled. Last seen by Dr. Maryalice Smaller 09/30/23 with cycle 5.  Oxaliplatin  fused over 3 hours and she still had shortness of breath at the end of the infusion, otherwise tolerated well.  She feels normal when she gets home and has  no other side effects from treatment except altered taste.  Denies cold sensitivity/neuropathy, nausea/vomiting, constipation or diarrhea, fever/chills, cough, leg edema, pain, or any other new or specific complaints.  ROS  All other systems reviewed and negative  Past Medical History:  Diagnosis Date   Allergy    Ambulates with cane    straight cane   Arthritis    knee, back   CHF (congestive heart failure) (HCC)    Ductal carcinoma in situ of breast 12/2018   R Breast-mastectomy only   Gout    History of blood transfusion 1986   w/ Hysterectomy surgery   Hyperlipidemia    diet controlled - no meds   Hypertension    Hypothyroidism    Pelvic kidney    lower right pelvic kidney    SBO (small bowel obstruction) (HCC) 10/2016   surgery    Sleep apnea    Mild - no mask needed per sleep study   Smoker    quit smoking 2018   Type 2 diabetes mellitus (HCC)      Past Surgical History:  Procedure Laterality Date   ABDOMINAL HYSTERECTOMY  1986   COMPLETE-precancerous   APPENDECTOMY     pt states it was removed when gallbladder was removed.   AUGMENTATION MAMMAPLASTY     BACK SURGERY     lower back   BILIARY BRUSHING  07/08/2023   Procedure: BILIARY BRUSHING;  Surgeon: Brice Campi Albino Alu., MD;  Location: [facility];  Service: Gastroenterology;;   BILIARY STENT PLACEMENT N/A 07/08/2023   Procedure: BILIARY STENT PLACEMENT;  Surgeon: Normie Becton., MD;  Location: [facility];  Service: Gastroenterology;  Laterality: N/A;   BIOPSY  06/14/2023   Procedure: BIOPSY;  Surgeon: Kenney Peacemaker, MD;  Location: [facility];  Service: Gastroenterology;;   BIOPSY  07/08/2023   Procedure: BIOPSY;  Surgeon: Normie Becton., MD;  Location: [facility];  Service: Gastroenterology;;   BREAST BIOPSY Right 05/12/2019   times 2   BREAST LUMPECTOMY WITH RADIOACTIVE SEED LOCALIZATION Right 03/18/2019   Procedure: RIGHT BREAST LUMPECTOMY WITH RADIOACTIVE SEED LOCALIZATION;   Surgeon: Caralyn Chandler, MD;  Location: [facility] OR;  Service: General;  Laterality: Right;   CHOLECYSTECTOMY     COLONOSCOPY  02/15/2008   [facility]plan    COLONOSCOPY WITH PROPOFOL   02/27/2018   Dr.Nandigam   ECTOPIC PREGNANCY SURGERY     ERCP N/A 06/14/2023   Procedure: ENDOSCOPIC RETROGRADE CHOLANGIOPANCREATOGRAPHY (ERCP);  Surgeon: Kenney Peacemaker, MD;  Location: [facility];  Service: Gastroenterology;  Laterality: N/A;   ERCP N/A 06/27/2023   Procedure: ENDOSCOPIC RETROGRADE CHOLANGIOPANCREATOGRAPHY (ERCP);  Surgeon: Lajuan Pila, MD;  Location: [facility];  Service: Gastroenterology;  Laterality: N/A;   ERCP N/A 07/08/2023   Procedure: ENDOSCOPIC RETROGRADE CHOLANGIOPANCREATOGRAPHY (ERCP);  Surgeon: Normie Becton., MD;  Location: [facility];  Service: Gastroenterology;  Laterality: N/A;   ESOPHAGOGASTRODUODENOSCOPY N/A 07/08/2023   Procedure: ESOPHAGOGASTRODUODENOSCOPY (EGD);  Surgeon: Normie Becton., MD;  Location: [facility];  Service: Gastroenterology;  Laterality: N/A;   ESOPHAGOGASTRODUODENOSCOPY (EGD) WITH PROPOFOL  N/A 06/14/2023   Procedure: ESOPHAGOGASTRODUODENOSCOPY (EGD) WITH PROPOFOL ;  Surgeon: Kenney Peacemaker, MD;  Location: [facility];  Service: Gastroenterology;  Laterality: N/A;   EUS N/A  07/08/2023   Procedure: UPPER ENDOSCOPIC ULTRASOUND (EUS) LINEAR;  Surgeon: Normie Becton., MD;  Location: [facility];  Service: Gastroenterology;  Laterality: N/A;   FINE NEEDLE ASPIRATION N/A 07/08/2023   Procedure: FINE NEEDLE ASPIRATION (FNA) LINEAR;  Surgeon: Normie Becton., MD;  Location: [facility];  Service: Gastroenterology;  Laterality: N/A;   IR IMAGING GUIDED PORT INSERTION  07/29/2023   JOINT REPLACEMENT     Left hip total Dr. France Ina 10-01-17   MALONEY DILATION  06/14/2023   Procedure: Londa Rival DILATION;  Surgeon: Kenney Peacemaker, MD;  Location: [facility];  Service: Gastroenterology;;   MASTECTOMY Right 07/26/2019   PANCREATIC STENT  PLACEMENT  06/14/2023   Procedure: PANCREATIC STENT PLACEMENT;  Surgeon: Kenney Peacemaker, MD;  Location: [facility];  Service: Gastroenterology;;   PANCREATIC STENT PLACEMENT  06/27/2023   Procedure: PANCREATIC STENT PLACEMENT;  Surgeon: Lajuan Pila, MD;  Location: [facility];  Service: Gastroenterology;;   POLYPECTOMY     polypectomy-oropharynx     RE-EXCISION OF BREAST CANCER,SUPERIOR MARGINS Right 04/08/2019   Procedure: RE-EXCISION OF RIGHT BREAST ANTERIOR MARGINS;  Surgeon: Lillette Reid III, MD;  Location: [facility];  Service: General;  Laterality: Right;   right knee meniscus     SBO  10/2016   small bowel obstruction   SIMPLE MASTECTOMY WITH AXILLARY SENTINEL NODE BIOPSY Right 07/26/2019   Procedure: RIGHT MASTECTOMY WITH SENTINEL NODE BIOPSY;  Surgeon: Caralyn Chandler, MD;  Location:  SURGERY CENTER;  Service: General;  Laterality: Right;   SPHINCTEROTOMY  06/27/2023   Procedure: SPHINCTEROTOMY;  Surgeon: Lajuan Pila, MD;  Location: [facility];  Service: Gastroenterology;;   [address]  07/08/2023   Procedure: [address];  Surgeon: Normie Becton., MD;  Location: [facility];  Service: Gastroenterology;;   TOTAL HIP ARTHROPLASTY Left 10/01/2017   Procedure: LEFT  TOTAL HIP ARTHROPLASTY ANTERIOR APPROACH;  Surgeon: Liliane Rei, MD;  Location: [facility];  Service: Orthopedics;  Laterality: Left;   TOTAL HIP ARTHROPLASTY Right 03/11/2018   Procedure: RIGHT TOTAL HIP ARTHROPLASTY ANTERIOR APPROACH;  Surgeon: Liliane Rei, MD;  Location: [facility];  Service: Orthopedics;  Laterality: Right;    UPPER GASTROINTESTINAL ENDOSCOPY       Outpatient Encounter Medications as of 10/14/2023  Medication Sig Note   Cholecalciferol (VITAMIN D -3) 125 MCG (5000 UT) TABS Take 1 tablet by mouth daily. 07/07/2023: When able to remember    famotidine  (PEPCID ) 20 MG tablet Take 1 tablet (20 mg total) by mouth 2 (two) times daily. (Patient taking differently: Take 20 mg by  mouth daily.)    fluticasone  (FLONASE ) 50 MCG/ACT nasal spray Place 2 sprays into both nostrils daily.    Glucose Blood (BLOOD GLUCOSE TEST STRIPS) STRP 1 each by In Vitro route in the morning, at noon, and at bedtime. May substitute to any manufacturer covered by patient's insurance.    insulin  aspart (NOVOLOG ) 100 UNIT/ML FlexPen Inject 0-6 Units into the skin 3 (three) times daily with meals. Check Blood Glucose (BG) and inject per scale: BG <150= 0 unit; BG 150-200= 1 unit; BG 201-250= 2 unit; BG 251-300= 3 unit; BG 301-350= 4 unit; BG 351-400= 5 unit; BG >400= 6 unit and Call Primary Care.    Insulin  Glargine (BASAGLAR  KWIKPEN) 100 UNIT/ML Inject 12 Units into the skin daily. May substitute as needed per insurance. (Patient taking differently: Inject 12 Units into the skin at bedtime.)    Insulin  Pen Needle (PEN NEEDLES) 31G X 5 MM MISC 1 each by Does not  apply route 3 (three) times daily. May dispense any manufacturer covered by patient's insurance.    Lancets (ONETOUCH DELICA PLUS LANCET33G) MISC 1 each by Does not apply route 3 (three) times daily.    lidocaine -prilocaine  (EMLA ) cream Apply 1 Application topically as needed. 07/29/2023: Hasn't started   losartan  (COZAAR ) 100 MG tablet Take 1 tablet by mouth once daily    NIFEdipine  (ADALAT  CC) 60 MG 24 hr tablet Take 1 tablet by mouth once daily    NP THYROID  90 MG tablet Take 1 tablet by mouth once daily    ondansetron  (ZOFRAN ) 8 MG tablet Take 1 tablet (8 mg total) by mouth every 8 (eight) hours as needed for nausea or vomiting. 07/29/2023: Hasn't started   potassium chloride  (KLOR-CON ) 10 MEQ tablet Take 1 tablet (10 mEq total) by mouth 3 (three) times daily for 7 days, THEN 1 tablet (10 mEq total) daily for 23 days.    prochlorperazine  (COMPAZINE ) 10 MG tablet Take 1 tablet (10 mg total) by mouth every 6 (six) hours as needed for nausea or vomiting. 07/29/2023: Hasn't started   No facility-administered encounter medications on file as of  10/14/2023.     Today's Vitals   10/14/23 0904 10/14/23 0931  BP: (!) 186/94 (!) 150/88  Pulse: 60   Resp: 16   Temp: 97.9 F (36.6 C)   TempSrc: Temporal   SpO2: 96%   Weight: 153 lb 4.8 oz (69.5 kg)   PainSc:  0-No pain   Body mass index is 26.73 kg/m.   PHYSICAL EXAM GENERAL:alert, no distress and comfortable SKIN: no rash  EYES: sclera clear NECK: without mass LYMPH:  no palpable cervical or supraclavicular lymphadenopathy  LUNGS: clear with normal breathing effort HEART: regular rate & rhythm, no lower extremity edema ABDOMEN: abdomen soft, non-tender and normal bowel sounds NEURO: alert & oriented x 3 with fluent speech, no focal motor/sensory deficits PAC without erythema    CBC    Component Value Date/Time   WBC 23.6 (H) 10/14/2023 0823   WBC 7.7 08/05/2023 1318   RBC 2.88 (L) 10/14/2023 0823   HGB 9.0 (L) 10/14/2023 0823   HGB 12.4 06/24/2023 0859   HCT 26.6 (L) 10/14/2023 0823   HCT 38.6 06/24/2023 0859   PLT 249 10/14/2023 0823   PLT 248 06/24/2023 0859   MCV 92.4 10/14/2023 0823   MCV 95 06/24/2023 0859   MCH 31.3 10/14/2023 0823   MCHC 33.8 10/14/2023 0823   RDW 17.0 (H) 10/14/2023 0823   RDW 13.1 06/24/2023 0859   LYMPHSABS 2.2 10/14/2023 0823   LYMPHSABS 2.3 06/24/2023 0859   MONOABS 1.6 (H) 10/14/2023 0823   EOSABS 0.0 10/14/2023 0823   EOSABS 0.1 06/24/2023 0859   BASOSABS 0.1 10/14/2023 0823   BASOSABS 0.0 06/24/2023 0859     CMP     Component Value Date/Time   NA 144 10/14/2023 0823   NA 142 06/24/2023 0859   K 3.7 10/14/2023 0823   CL 109 10/14/2023 0823   CO2 27 10/14/2023 0823   GLUCOSE 116 (H) 10/14/2023 0823   BUN 11 10/14/2023 0823   BUN 19 06/24/2023 0859   CREATININE 1.09 (H) 10/14/2023 0823   CREATININE 1.05 (H) 12/05/2020 1027   CALCIUM  8.3 (L) 10/14/2023 0823   PROT 6.7 10/14/2023 0823   PROT 7.4 06/24/2023 0859   ALBUMIN 3.6 10/14/2023 0823   ALBUMIN 4.2 06/24/2023 0859   AST 307 (HH) 10/14/2023 0823   ALT 176  (H) 10/14/2023 1610  ALKPHOS 691 (H) 10/14/2023 0823   BILITOT 1.1 10/14/2023 0823   GFRNONAA 53 (L) 10/14/2023 0823   GFRNONAA 67 08/29/2020 1048   GFRAA 78 08/29/2020 1048     ASSESSMENT & PLAN:78 year old female     Pancreatic adenocarcinoma, cT2N0M0, stage IB -She presented with painless jaundice and intentional weight loss, workup showed pancreatic head mass with double duct sign, concerning for malignancy -S/p pancreatic duct stenting, but failed bile duct stenting x 2 -Baseline CA 19-9 was normal -S/p ERCP and stent placement EUS with FNA by Dr. Brice Campi 07/08/23 which confirmed adenocarcinoma.  -Due to tumor invasion of portal vein and SMV, she is on neoadjuvant chemotherapy FOLFIRINOX starting 08/05/23 for planned 8 cycles  - Adrienne [name] appears stable, s/p cycle 5, tolerating prolonged oxalic infusion with mild shortness of breath.  She otherwise tolerates treatment very well, maintains adequate performance status ECOG 0.  No clinical evidence of disease progression - Labs reviewed, mild transaminitis, exam is benign.  She does have a stent in place.  Proceed with cycle 6 chemo, no dose adjustments per pharmacy - Repeat LFTs next week, if worse will proceed with imaging rather than wait until after cycle 7   DCIS, right breast, stage 0, ER/PR+, G2 -Diagnosed 01/2019, s/p right lumpectomy on 03/18/2019, due to positive margin she underwent reexcision 04/08/2019 and mastectomy on 07/26/2019 -Final path showed DCIS, no invasive cancer, did not need postmastectomy radiation and declined adjuvant antiestrogen -Screening left mammogram 03/06/2023 was benign     PLAN: -Labs reviewed -Proceed with cycle 6 neoadjuvant FOLFIRINOX, oxali over 3 hours, no dose adjustments -Pump d/c and G-CSF on day 3 -Lab next week to monitor LFTs -Follow-up with cycle 7 in 2 weeks, then restage and review in conference -Anticipate completing 8 cycles then follow-up with surgery  Orders Placed This  Encounter  Procedures   CT PANCREAS ABDOMEN W WO CONTRAST    Standing Status:   Future    Expected Date:   11/04/2023    Expiration Date:   10/13/2024    If indicated for the ordered procedure, I authorize the administration of contrast media per Radiology protocol:   Yes    Does the patient have a contrast media/X-ray dye allergy?:   No    Preferred imaging location?:   [facility]      All questions were answered. The patient knows to call the clinic with any problems, questions or concerns. No barriers to learning were detected. I spent 20 minutes counseling the patient face to face. The total time spent in the appointment was 30 minutes and more than 50% was on counseling, review of test results, and coordination of care.   Adrienne Kearse, NP-C 10/14/2023

## 2023-10-14 ENCOUNTER — Encounter: Payer: Self-pay | Admitting: Nurse Practitioner

## 2023-10-14 ENCOUNTER — Inpatient Hospital Stay

## 2023-10-14 ENCOUNTER — Telehealth: Payer: Self-pay | Admitting: Nurse Practitioner

## 2023-10-14 ENCOUNTER — Encounter: Payer: Self-pay | Admitting: Hematology

## 2023-10-14 ENCOUNTER — Inpatient Hospital Stay: Admitting: Nurse Practitioner

## 2023-10-14 ENCOUNTER — Telehealth: Payer: Self-pay

## 2023-10-14 ENCOUNTER — Inpatient Hospital Stay: Admitting: Nutrition

## 2023-10-14 VITALS — BP 150/88 | HR 60 | Temp 97.9°F | Resp 16 | Wt 153.3 lb

## 2023-10-14 VITALS — BP 125/66 | HR 74 | Resp 18

## 2023-10-14 DIAGNOSIS — Z5111 Encounter for antineoplastic chemotherapy: Secondary | ICD-10-CM | POA: Diagnosis not present

## 2023-10-14 DIAGNOSIS — C259 Malignant neoplasm of pancreas, unspecified: Secondary | ICD-10-CM | POA: Diagnosis not present

## 2023-10-14 DIAGNOSIS — Z95828 Presence of other vascular implants and grafts: Secondary | ICD-10-CM

## 2023-10-14 LAB — CMP (CANCER CENTER ONLY)
ALT: 176 U/L — ABNORMAL HIGH (ref 0–44)
AST: 307 U/L (ref 15–41)
Albumin: 3.6 g/dL (ref 3.5–5.0)
Alkaline Phosphatase: 691 U/L — ABNORMAL HIGH (ref 38–126)
Anion gap: 8 (ref 5–15)
BUN: 11 mg/dL (ref 8–23)
CO2: 27 mmol/L (ref 22–32)
Calcium: 8.3 mg/dL — ABNORMAL LOW (ref 8.9–10.3)
Chloride: 109 mmol/L (ref 98–111)
Creatinine: 1.09 mg/dL — ABNORMAL HIGH (ref 0.44–1.00)
GFR, Estimated: 53 mL/min — ABNORMAL LOW (ref >60)
Glucose, Bld: 116 mg/dL — ABNORMAL HIGH (ref 70–99)
Potassium: 3.7 mmol/L (ref 3.5–5.1)
Sodium: 144 mmol/L (ref 135–145)
Total Bilirubin: 1.1 mg/dL (ref 0.0–1.2)
Total Protein: 6.7 g/dL (ref 6.5–8.1)

## 2023-10-14 LAB — CBC WITH DIFFERENTIAL (CANCER CENTER ONLY)
Abs Immature Granulocytes: 0.96 10*3/uL — ABNORMAL HIGH (ref 0.00–0.07)
Basophils Absolute: 0.1 10*3/uL (ref 0.0–0.1)
Basophils Relative: 0 %
Eosinophils Absolute: 0 10*3/uL (ref 0.0–0.5)
Eosinophils Relative: 0 %
HCT: 26.6 % — ABNORMAL LOW (ref 36.0–46.0)
Hemoglobin: 9 g/dL — ABNORMAL LOW (ref 12.0–15.0)
Immature Granulocytes: 4 %
Lymphocytes Relative: 9 %
Lymphs Abs: 2.2 10*3/uL (ref 0.7–4.0)
MCH: 31.3 pg (ref 26.0–34.0)
MCHC: 33.8 g/dL (ref 30.0–36.0)
MCV: 92.4 fL (ref 80.0–100.0)
Monocytes Absolute: 1.6 10*3/uL — ABNORMAL HIGH (ref 0.1–1.0)
Monocytes Relative: 7 %
Neutro Abs: 18.8 10*3/uL — ABNORMAL HIGH (ref 1.7–7.7)
Neutrophils Relative %: 80 %
Platelet Count: 249 10*3/uL (ref 150–400)
RBC: 2.88 MIL/uL — ABNORMAL LOW (ref 3.87–5.11)
RDW: 17 % — ABNORMAL HIGH (ref 11.5–15.5)
WBC Count: 23.6 10*3/uL — ABNORMAL HIGH (ref 4.0–10.5)
nRBC: 0.6 % — ABNORMAL HIGH (ref 0.0–0.2)

## 2023-10-14 MED ORDER — ATROPINE SULFATE 1 MG/ML IV SOLN
0.5000 mg | Freq: Once | INTRAVENOUS | Status: AC | PRN
  Administered 2023-10-14: 0.5 mg via INTRAVENOUS
  Filled 2023-10-14: qty 1

## 2023-10-14 MED ORDER — DEXAMETHASONE SODIUM PHOSPHATE 10 MG/ML IJ SOLN
10.0000 mg | Freq: Once | INTRAMUSCULAR | Status: AC
  Administered 2023-10-14: 10 mg via INTRAVENOUS
  Filled 2023-10-14: qty 1

## 2023-10-14 MED ORDER — HEPARIN SOD (PORK) LOCK FLUSH 100 UNIT/ML IV SOLN: 500.0000 [IU] | Freq: Once | INTRAVENOUS | Status: DC | PRN

## 2023-10-14 MED ORDER — SODIUM CHLORIDE 0.9 % IV SOLN
400.0000 mg/m2 | Freq: Once | INTRAVENOUS | Status: AC
  Administered 2023-10-14: 728 mg via INTRAVENOUS
  Filled 2023-10-14: qty 25

## 2023-10-14 MED ORDER — FAMOTIDINE IN NACL 20-0.9 MG/50ML-% IV SOLN
20.0000 mg | Freq: Once | INTRAVENOUS | Status: AC
  Administered 2023-10-14: 20 mg via INTRAVENOUS
  Filled 2023-10-14: qty 50

## 2023-10-14 MED ORDER — SODIUM CHLORIDE 0.9 % IV SOLN
260.0000 mg | Freq: Once | INTRAVENOUS | Status: AC
  Administered 2023-10-14: 260 mg via INTRAVENOUS
  Filled 2023-10-14: qty 13

## 2023-10-14 MED ORDER — SODIUM CHLORIDE 0.9 % IV SOLN
2400.0000 mg/m2 | INTRAVENOUS | Status: DC
  Administered 2023-10-14: 4350 mg via INTRAVENOUS
  Filled 2023-10-14: qty 87

## 2023-10-14 MED ORDER — SODIUM CHLORIDE 0.9% FLUSH
10.0000 mL | Freq: Once | INTRAVENOUS | Status: AC
  Administered 2023-10-14: 10 mL

## 2023-10-14 MED ORDER — OXALIPLATIN CHEMO INJECTION 100 MG/20ML
85.0000 mg/m2 | Freq: Once | INTRAVENOUS | Status: AC
  Administered 2023-10-14: 150 mg via INTRAVENOUS
  Filled 2023-10-14: qty 10

## 2023-10-14 MED ORDER — SODIUM CHLORIDE 0.9 % IV SOLN
150.0000 mg | Freq: Once | INTRAVENOUS | Status: AC
  Administered 2023-10-14: 150 mg via INTRAVENOUS
  Filled 2023-10-14: qty 150

## 2023-10-14 MED ORDER — DEXTROSE 5 % IV SOLN: INTRAVENOUS | Status: DC

## 2023-10-14 MED ORDER — PALONOSETRON HCL INJECTION 0.25 MG/5ML
0.2500 mg | Freq: Once | INTRAVENOUS | Status: AC
  Administered 2023-10-14: 0.25 mg via INTRAVENOUS
  Filled 2023-10-14: qty 5

## 2023-10-14 MED ORDER — SODIUM CHLORIDE 0.9% FLUSH: 10.0000 mL | INTRAVENOUS | Status: DC | PRN

## 2023-10-14 MED ORDER — DIPHENHYDRAMINE HCL 50 MG/ML IJ SOLN
50.0000 mg | Freq: Once | INTRAMUSCULAR | Status: AC
  Administered 2023-10-14: 50 mg via INTRAVENOUS
  Filled 2023-10-14: qty 1

## 2023-10-14 NOTE — Progress Notes (Signed)
 Pt's Tbili wnl.  Will proceed w/ planned doses of chemo.  D/w Lacie Burton, NP and in agreement.  Shaun Zuccaro, Pharm.D., CPP 10/14/2023@10 :03 AM

## 2023-10-14 NOTE — Progress Notes (Signed)
 Per Lacie PA, ok to proceed today with AST 307 and ALT 176. Dose adjusted per pharmacy

## 2023-10-14 NOTE — Patient Instructions (Signed)
 [facility] - A DEPT OF [facility]  Discharge Instructions: Thank you for choosing [facility] to provide your oncology and hematology care.   If you have a lab appointment with the Cancer Center, please go directly to the Cancer Center and check in at the registration area.   Wear comfortable clothing and clothing appropriate for easy access to any Portacath or PICC line.   We strive to give you quality time with your provider. You may need to reschedule your appointment if you arrive late (15 or more minutes).  Arriving late affects you and other patients whose appointments are after yours.  Also, if you miss three or more appointments without notifying the office, you may be dismissed from the clinic at the provider's discretion.      For prescription refill requests, have your pharmacy contact our office and allow 72 hours for refills to be completed.    Today you received the following chemotherapy and/or immunotherapy agents: Oxaliplatin, Leucovorin, Irinotecan, Fluorouracil      To help prevent nausea and vomiting after your treatment, we encourage you to take your nausea medication as directed.  BELOW ARE SYMPTOMS THAT SHOULD BE REPORTED IMMEDIATELY: *FEVER GREATER THAN 100.4 F (38 C) OR HIGHER *CHILLS OR SWEATING *NAUSEA AND VOMITING THAT IS NOT CONTROLLED WITH YOUR NAUSEA MEDICATION *UNUSUAL SHORTNESS OF BREATH *UNUSUAL BRUISING OR BLEEDING *URINARY PROBLEMS (pain or burning when urinating, or frequent urination) *BOWEL PROBLEMS (unusual diarrhea, constipation, pain near the anus) TENDERNESS IN MOUTH AND THROAT WITH OR WITHOUT PRESENCE OF ULCERS (sore throat, sores in mouth, or a toothache) UNUSUAL RASH, SWELLING OR PAIN  UNUSUAL VAGINAL DISCHARGE OR ITCHING   Items with * indicate a potential emergency and should be followed up as soon as possible or go to the Emergency Department if any problems should occur.  Please show the  CHEMOTHERAPY ALERT CARD or IMMUNOTHERAPY ALERT CARD at check-in to the Emergency Department and triage nurse.  Should you have questions after your visit or need to cancel or reschedule your appointment, please contact [facility] - A DEPT [facility]  Dept: [phone]  and follow the prompts.  Office hours are 8:00 a.m. to 4:30 p.m. Monday - Friday. Please note that voicemails left after 4:00 p.m. may not be returned until the following business day.  We are closed weekends and major holidays. You have access to a nurse at all times for urgent questions. Please call the main number to the clinic Dept: [phone] and follow the prompts.   For any non-urgent questions, you may also contact your provider using MyChart. We now offer e-Visits for anyone 82 and older to request care online for non-urgent symptoms. For details visit [email or web address].   Also download the MyChart app! Go to the app store, search "MyChart", open the app, select [place] [place], and log in with your MyChart username and password.

## 2023-10-14 NOTE — Progress Notes (Signed)
 OK to use today's BSA = 1.76 m2 for Irinotecan  dose; adjusted dose per Dr. Candise Chambers recommendation.  Jaquitta Dupriest, Pharm.D., CPP 10/14/2023@10 :14 AM

## 2023-10-14 NOTE — Telephone Encounter (Signed)
 Critical lab value reported: AST 307 Notified Delvin File, NP & Cleda Curly, CMA since Lacie seeing pt today in clinic.

## 2023-10-14 NOTE — Progress Notes (Signed)
 Nutrition follow-up completed with patient during infusion for pancreas cancer.  Patient is followed by Dr. Maryalice Smaller.  Weight: 153 pounds 4.8 ounces April 22. 157 pounds 12.8 ounces March 25 164 pounds February 25  Labs include glucose 116, creatinine 1.09.  Taste alterations continue.  It is difficult for her to eat because food does not taste good.  She has tried baking soda and salt water  gargle without much success.  Has been limiting fresh fruit but overall enjoys the taste of it.  Reports blood sugars are within expected and acceptable range.  Nutrition diagnosis:  Food and nutrition related knowledge deficit, continues.  Intervention: Educated to incorporate fresh fruits with meals and snacks as tolerated.  Provided snack list. Encouraged weight maintenance. Support provided.  Monitoring, evaluation, goals: Increase calories and protein to promote weight stabilization.  Next visit: Tuesday, May 20 during infusion.  **Disclaimer: This note was dictated with voice recognition software. Similar sounding words can inadvertently be transcribed and this note may contain transcription errors which may not have been corrected upon publication of note.**

## 2023-10-14 NOTE — Telephone Encounter (Signed)
 Per a sceure chat I received from Lacie on 4/22, I scheduled Shaden for her Lab appointment. Patient scheduled appointments. Patient is aware of all appointment details.

## 2023-10-16 ENCOUNTER — Inpatient Hospital Stay

## 2023-10-16 VITALS — BP 112/65 | HR 57 | Temp 98.9°F | Resp 17

## 2023-10-16 DIAGNOSIS — Z5111 Encounter for antineoplastic chemotherapy: Secondary | ICD-10-CM | POA: Diagnosis not present

## 2023-10-16 DIAGNOSIS — C259 Malignant neoplasm of pancreas, unspecified: Secondary | ICD-10-CM

## 2023-10-16 MED ORDER — PEGFILGRASTIM-CBQV 6 MG/0.6ML ~~LOC~~ SOSY
6.0000 mg | PREFILLED_SYRINGE | Freq: Once | SUBCUTANEOUS | Status: AC
  Administered 2023-10-16: 6 mg via SUBCUTANEOUS
  Filled 2023-10-16: qty 0.6

## 2023-10-16 MED ORDER — SODIUM CHLORIDE 0.9% FLUSH
10.0000 mL | INTRAVENOUS | Status: DC | PRN
  Administered 2023-10-16: 10 mL

## 2023-10-16 MED ORDER — HEPARIN SOD (PORK) LOCK FLUSH 100 UNIT/ML IV SOLN
500.0000 [IU] | Freq: Once | INTRAVENOUS | Status: AC | PRN
  Administered 2023-10-16: 500 [IU]

## 2023-10-17 ENCOUNTER — Telehealth: Payer: Self-pay | Admitting: Hematology

## 2023-10-17 ENCOUNTER — Other Ambulatory Visit: Payer: Self-pay

## 2023-10-20 ENCOUNTER — Inpatient Hospital Stay

## 2023-10-20 DIAGNOSIS — Z5111 Encounter for antineoplastic chemotherapy: Secondary | ICD-10-CM | POA: Diagnosis not present

## 2023-10-20 DIAGNOSIS — Z95828 Presence of other vascular implants and grafts: Secondary | ICD-10-CM

## 2023-10-20 DIAGNOSIS — K8689 Other specified diseases of pancreas: Secondary | ICD-10-CM

## 2023-10-20 LAB — CBC WITH DIFFERENTIAL/PLATELET
Abs Immature Granulocytes: 0.54 10*3/uL — ABNORMAL HIGH (ref 0.00–0.07)
Basophils Absolute: 0.2 10*3/uL — ABNORMAL HIGH (ref 0.0–0.1)
Basophils Relative: 0 %
Eosinophils Absolute: 0 10*3/uL (ref 0.0–0.5)
Eosinophils Relative: 0 %
HCT: 26.6 % — ABNORMAL LOW (ref 36.0–46.0)
Hemoglobin: 9.1 g/dL — ABNORMAL LOW (ref 12.0–15.0)
Immature Granulocytes: 2 %
Lymphocytes Relative: 10 %
Lymphs Abs: 3.5 10*3/uL (ref 0.7–4.0)
MCH: 31.7 pg (ref 26.0–34.0)
MCHC: 34.2 g/dL (ref 30.0–36.0)
MCV: 92.7 fL (ref 80.0–100.0)
Monocytes Absolute: 0.4 10*3/uL (ref 0.1–1.0)
Monocytes Relative: 1 %
Neutro Abs: 29.8 10*3/uL — ABNORMAL HIGH (ref 1.7–7.7)
Neutrophils Relative %: 87 %
Platelets: 127 10*3/uL — ABNORMAL LOW (ref 150–400)
RBC: 2.87 MIL/uL — ABNORMAL LOW (ref 3.87–5.11)
RDW: 16.8 % — ABNORMAL HIGH (ref 11.5–15.5)
WBC: 34.4 10*3/uL — ABNORMAL HIGH (ref 4.0–10.5)
nRBC: 0 % (ref 0.0–0.2)

## 2023-10-20 LAB — COMPREHENSIVE METABOLIC PANEL WITH GFR
ALT: 49 U/L — ABNORMAL HIGH (ref 0–44)
AST: 34 U/L (ref 15–41)
Albumin: 3.8 g/dL (ref 3.5–5.0)
Alkaline Phosphatase: 453 U/L — ABNORMAL HIGH (ref 38–126)
Anion gap: 5 (ref 5–15)
BUN: 15 mg/dL (ref 8–23)
CO2: 27 mmol/L (ref 22–32)
Calcium: 8.3 mg/dL — ABNORMAL LOW (ref 8.9–10.3)
Chloride: 109 mmol/L (ref 98–111)
Creatinine, Ser: 0.78 mg/dL (ref 0.44–1.00)
GFR, Estimated: >60 mL/min (ref >60)
Glucose, Bld: 133 mg/dL — ABNORMAL HIGH (ref 70–99)
Potassium: 4 mmol/L (ref 3.5–5.1)
Sodium: 141 mmol/L (ref 135–145)
Total Bilirubin: 0.5 mg/dL (ref 0.0–1.2)
Total Protein: 6.6 g/dL (ref 6.5–8.1)

## 2023-10-20 MED ORDER — HEPARIN SOD (PORK) LOCK FLUSH 100 UNIT/ML IV SOLN
500.0000 [IU] | Freq: Once | INTRAVENOUS | Status: AC
  Administered 2023-10-20: 500 [IU]

## 2023-10-20 MED ORDER — SODIUM CHLORIDE 0.9% FLUSH
10.0000 mL | Freq: Once | INTRAVENOUS | Status: AC
  Administered 2023-10-20: 10 mL

## 2023-10-21 ENCOUNTER — Ambulatory Visit: Payer: PPO | Admitting: Internal Medicine

## 2023-10-21 ENCOUNTER — Ambulatory Visit (INDEPENDENT_AMBULATORY_CARE_PROVIDER_SITE_OTHER): Payer: PPO | Admitting: Internal Medicine

## 2023-10-21 ENCOUNTER — Encounter: Payer: Self-pay | Admitting: Internal Medicine

## 2023-10-21 VITALS — BP 138/86 | HR 68 | Ht 63.5 in | Wt 152.6 lb

## 2023-10-21 DIAGNOSIS — E782 Mixed hyperlipidemia: Secondary | ICD-10-CM

## 2023-10-21 DIAGNOSIS — C259 Malignant neoplasm of pancreas, unspecified: Secondary | ICD-10-CM

## 2023-10-21 DIAGNOSIS — I10 Essential (primary) hypertension: Secondary | ICD-10-CM | POA: Diagnosis not present

## 2023-10-21 DIAGNOSIS — Z794 Long term (current) use of insulin: Secondary | ICD-10-CM

## 2023-10-21 DIAGNOSIS — I428 Other cardiomyopathies: Secondary | ICD-10-CM

## 2023-10-21 DIAGNOSIS — D61810 Antineoplastic chemotherapy induced pancytopenia: Secondary | ICD-10-CM | POA: Insufficient documentation

## 2023-10-21 DIAGNOSIS — Z0001 Encounter for general adult medical examination with abnormal findings: Secondary | ICD-10-CM

## 2023-10-21 DIAGNOSIS — J439 Emphysema, unspecified: Secondary | ICD-10-CM | POA: Diagnosis not present

## 2023-10-21 DIAGNOSIS — J849 Interstitial pulmonary disease, unspecified: Secondary | ICD-10-CM

## 2023-10-21 DIAGNOSIS — E039 Hypothyroidism, unspecified: Secondary | ICD-10-CM

## 2023-10-21 DIAGNOSIS — E1169 Type 2 diabetes mellitus with other specified complication: Secondary | ICD-10-CM

## 2023-10-21 MED ORDER — THYROID 90 MG PO TABS: 90.0000 mg | ORAL_TABLET | Freq: Every day | ORAL | 5 refills | Status: DC

## 2023-10-21 MED ORDER — NIFEDIPINE ER 60 MG PO TB24: 60.0000 mg | ORAL_TABLET | Freq: Every day | ORAL | 1 refills | Status: DC

## 2023-10-21 MED ORDER — LOSARTAN POTASSIUM 100 MG PO TABS: 100.0000 mg | ORAL_TABLET | Freq: Every day | ORAL | 1 refills | Status: DC

## 2023-10-21 NOTE — Patient Instructions (Addendum)
 Please schedule Medicare Annual Wellness.  Please continue to take medications as prescribed.  Please continue to follow low carb diet and perform moderate exercise/walking as tolerated.

## 2023-10-21 NOTE — Assessment & Plan Note (Signed)
 Last CBC reviewed Followed by Hemato-Oncology

## 2023-10-21 NOTE — Assessment & Plan Note (Addendum)
 POC HbA1c: 7.2, improved than prior Well-controlled for her age On Basaglar  12 units nightly and NovoLog  ISS Associated with HTN and HLD Metformin  discontinued from recent hospitalization Avoid sulfonylureas or incretin analogs due to pancreatic mass Advised to follow diabetic diet On ARB F/u CMP and lipid panel Diabetic eye exam: Advised to follow up with Ophthalmology for diabetic eye exam

## 2023-10-21 NOTE — Assessment & Plan Note (Signed)
HRCT chest reviewed Currently asymptomatic Followed by pulmonology

## 2023-10-21 NOTE — Assessment & Plan Note (Signed)
 Physical exam as documented. Recent blood tests reviewed from the chart, added Lipid profile.

## 2023-10-21 NOTE — Assessment & Plan Note (Addendum)
 Has uncontrolled HLD Did not tolerate statins Had Pharm.D. consultation for PCSK9 inhibitor therapy through cardiology, was approved for Leqvio 

## 2023-10-21 NOTE — Progress Notes (Signed)
 Established Patient Office Visit  Subjective:  Patient ID: Adrienne [name], female    DOB: 12/01/1945  Age: 78 y.o. MRN: [id number]  CC:  Chief Complaint  Patient presents with   Medical Management of Chronic Issues    4 month f/u     HPI Adrienne [name] is a 78 y.o. female with past medical history of HTN, type 2 DM, nonishcemic CM, hypothyroidism, OA of hip and knee, HLD, breast ca. s/p right mastectomy who presents for f/u of her chronic medical conditions.  HTN: BP is WNL today. Takes Losartan  100 mg QD and nifedipine  60 mg QD regularly. Coreg  was discontinued due to bradycardia. Patient denies headache, dizziness, chest pain, dyspnea or palpitations.  Hypothyroidism: She takes NP thyroid  90 mg daily currently.  She has history of oversupplemented thyroid  in the past, but recent TSH has been better.  She denies any recent change in appetite, weight, constipation, tremors or palpitations.  Type 2 DM: Her HbA1c was 7.2 today, better than prior - 8.1 in 12/24.  She has been placed on Basaglar  12 units nightly and ISS.  Her blood glucose have been better controlled, around 80-140 now.  She has not needed mealtime insulin  recently.  Denies any polyuria or polydipsia currently.  Pancreatic adenocarcinoma: She has started chemotherapy.  She complains of lack of appetite, loss of taste sensation and intermittent tingling of bilateral LE.  Denies overt nausea or vomiting.  Denies chronic diarrhea.  Nonischemic CM: She has been evaluated by cardiology.  She had echocardiogram in 03/24, which showed MR with mild MVP.   HLD: She has hot intolerance to statins.  Her LDL was 145 in 04/24.  She had Pharm.D. visit for PCSK9 inhibitor therapy.  She was approved for Leqvio  and has started it now.      Past Medical History:  Diagnosis Date   Allergy    Ambulates with cane    straight cane   Arthritis    knee, back   CHF (congestive heart failure) (HCC)    Ductal carcinoma in situ  of breast 12/2018   R Breast-mastectomy only   Gout    History of blood transfusion 1986   w/ Hysterectomy surgery   Hyperlipidemia    diet controlled - no meds   Hypertension    Hypothyroidism    Pelvic kidney    lower right pelvic kidney    SBO (small bowel obstruction) (HCC) 10/2016   surgery    Sleep apnea    Mild - no mask needed per sleep study   Smoker    quit smoking 2018   Type 2 diabetes mellitus (HCC)     Past Surgical History:  Procedure Laterality Date   ABDOMINAL HYSTERECTOMY  1986   COMPLETE-precancerous   APPENDECTOMY     pt states it was removed when gallbladder was removed.   AUGMENTATION MAMMAPLASTY     BACK SURGERY     lower back   BILIARY BRUSHING  07/08/2023   Procedure: BILIARY BRUSHING;  Surgeon: Brice Campi Albino Alu., MD;  Location: [facility];  Service: Gastroenterology;;   BILIARY STENT PLACEMENT N/A 07/08/2023   Procedure: BILIARY STENT PLACEMENT;  Surgeon: Normie Becton., MD;  Location: [facility];  Service: Gastroenterology;  Laterality: N/A;   BIOPSY  06/14/2023   Procedure: BIOPSY;  Surgeon: Kenney Peacemaker, MD;  Location: [facility];  Service: Gastroenterology;;   BIOPSY  07/08/2023   Procedure: BIOPSY;  Surgeon: Normie Becton., MD;  Location: [facility];  Service: Gastroenterology;;   BREAST BIOPSY Right 05/12/2019   times 2   BREAST LUMPECTOMY WITH RADIOACTIVE SEED LOCALIZATION Right 03/18/2019   Procedure: RIGHT BREAST LUMPECTOMY WITH RADIOACTIVE SEED LOCALIZATION;  Surgeon: Caralyn Chandler, MD;  Location: [facility];  Service: General;  Laterality: Right;   CHOLECYSTECTOMY     COLONOSCOPY  02/15/2008   [facility]    COLONOSCOPY WITH PROPOFOL   02/27/2018   Dr.Nandigam   ECTOPIC PREGNANCY SURGERY     ERCP N/A 06/14/2023   Procedure: ENDOSCOPIC RETROGRADE CHOLANGIOPANCREATOGRAPHY (ERCP);  Surgeon: Kenney Peacemaker, MD;  Location: [facility];  Service: Gastroenterology;  Laterality: N/A;   ERCP N/A 06/27/2023    Procedure: ENDOSCOPIC RETROGRADE CHOLANGIOPANCREATOGRAPHY (ERCP);  Surgeon: Lajuan Pila, MD;  Location: [facility];  Service: Gastroenterology;  Laterality: N/A;   ERCP N/A 07/08/2023   Procedure: ENDOSCOPIC RETROGRADE CHOLANGIOPANCREATOGRAPHY (ERCP);  Surgeon: Normie Becton., MD;  Location: [facility];  Service: Gastroenterology;  Laterality: N/A;   ESOPHAGOGASTRODUODENOSCOPY N/A 07/08/2023   Procedure: ESOPHAGOGASTRODUODENOSCOPY (EGD);  Surgeon: Normie Becton., MD;  Location: [facility];  Service: Gastroenterology;  Laterality: N/A;   ESOPHAGOGASTRODUODENOSCOPY (EGD) WITH PROPOFOL  N/A 06/14/2023   Procedure: ESOPHAGOGASTRODUODENOSCOPY (EGD) WITH PROPOFOL ;  Surgeon: Kenney Peacemaker, MD;  Location: [facility];  Service: Gastroenterology;  Laterality: N/A;   EUS N/A 07/08/2023   Procedure: UPPER ENDOSCOPIC ULTRASOUND (EUS) LINEAR;  Surgeon: Normie Becton., MD;  Location: [facility];  Service: Gastroenterology;  Laterality: N/A;   FINE NEEDLE ASPIRATION N/A 07/08/2023   Procedure: FINE NEEDLE ASPIRATION (FNA) LINEAR;  Surgeon: Normie Becton., MD;  Location: [facility];  Service: Gastroenterology;  Laterality: N/A;   IR IMAGING GUIDED PORT INSERTION  07/29/2023   JOINT REPLACEMENT     Left hip total Dr. France Ina 10-01-17   MALONEY DILATION  06/14/2023   Procedure: Londa Rival DILATION;  Surgeon: Kenney Peacemaker, MD;  Location: [facility];  Service: Gastroenterology;;   MASTECTOMY Right 07/26/2019   PANCREATIC STENT PLACEMENT  06/14/2023   Procedure: PANCREATIC STENT PLACEMENT;  Surgeon: Kenney Peacemaker, MD;  Location: [facility];  Service: Gastroenterology;;   PANCREATIC STENT PLACEMENT  06/27/2023   Procedure: PANCREATIC STENT PLACEMENT;  Surgeon: Lajuan Pila, MD;  Location: [facility];  Service: Gastroenterology;;   POLYPECTOMY     polypectomy-oropharynx     RE-EXCISION OF BREAST CANCER,SUPERIOR MARGINS Right 04/08/2019   Procedure: RE-EXCISION OF  RIGHT BREAST ANTERIOR MARGINS;  Surgeon: Lillette Reid III, MD;  Location: [facility];  Service: General;  Laterality: Right;   right knee meniscus     SBO  10/2016   small bowel obstruction   SIMPLE MASTECTOMY WITH AXILLARY SENTINEL NODE BIOPSY Right 07/26/2019   Procedure: RIGHT MASTECTOMY WITH SENTINEL NODE BIOPSY;  Surgeon: Caralyn Chandler, MD;  Location: [facility];  Service: General;  Laterality: Right;   SPHINCTEROTOMY  06/27/2023   Procedure: SPHINCTEROTOMY;  Surgeon: Lajuan Pila, MD;  Location: [facility];  Service: Gastroenterology;;   [address]  07/08/2023   Procedure: [address];  Surgeon: Normie Becton., MD;  Location: [facility];  Service: Gastroenterology;;   TOTAL HIP ARTHROPLASTY Left 10/01/2017   Procedure: LEFT  TOTAL HIP ARTHROPLASTY ANTERIOR APPROACH;  Surgeon: Liliane Rei, MD;  Location: [facility];  Service: Orthopedics;  Laterality: Left;   TOTAL HIP ARTHROPLASTY Right 03/11/2018   Procedure: RIGHT TOTAL HIP ARTHROPLASTY ANTERIOR APPROACH;  Surgeon: Liliane Rei, MD;  Location: [facility];  Service: Orthopedics;  Laterality: Right;    UPPER GASTROINTESTINAL ENDOSCOPY  Family History  Problem Relation Age of Onset   Cancer Sister    Diabetes Brother    Diabetes Brother    Hypertension Other    Breast cancer Sister    Colon cancer Neg Hx    Colon polyps Neg Hx    Rectal cancer Neg Hx    Stomach cancer Neg Hx     Social History   Socioeconomic History   Marital status: Widowed    Spouse name: Not on file   Number of children: 3   Years of education: 67   Highest education level: Some college, no degree  Occupational History   Occupation: retired  Tobacco Use   Smoking status: Former    Current packs/day: 0.00    Average packs/day: 1 pack/day for 32.0 years (32.0 ttl pk-yrs)    Types: E-cigarettes, Cigarettes    Start date: 11/20/1984    Quit date: 11/20/2016    Years since quitting: 6.9   Smokeless tobacco: Never    Tobacco comments:    Smoker since 1975 has quit and started back several times    As of 07/03/23: smoked 30 years, 1 PPD, quit 2018  Vaping Use   Vaping status: Former  Substance and Sexual Activity   Alcohol use: No   Drug use: No   Sexual activity: Not Currently    Birth control/protection: Surgical    Comment: Hysterectomy  Other Topics Concern   Not on file  Social History Narrative   Widow since 2006.Married previously for 25 years.Lives with grandson and 2 great grandchildren.Retired.Previously office work.   Social Drivers of Corporate investment banker Strain: Low Risk  (06/27/2021)   Overall Financial Resource Strain (CARDIA)    Difficulty of Paying Living Expenses: Not hard at all  Food Insecurity: No Food Insecurity (07/10/2023)   Hunger Vital Sign    Worried About Running Out of Food in the Last Year: Never true    Ran Out of Food in the Last Year: Never true  Transportation Needs: No Transportation Needs (07/10/2023)   PRAPARE - Administrator, Civil Service (Medical): No    Lack of Transportation (Non-Medical): No  Physical Activity: Inactive (06/27/2021)   Exercise Vital Sign    Days of Exercise per Week: 0 days    Minutes of Exercise per Session: 0 min  Stress: No Stress Concern Present (06/27/2021)   [employer] of Occupational Health - Occupational Stress Questionnaire    Feeling of Stress : Not at all  Social Connections: Moderately Integrated (07/07/2023)   Social Connection and Isolation Panel [NHANES]    Frequency of Communication with Friends and Family: More than three times a week    Frequency of Social Gatherings with Friends and Family: Once a week    Attends Religious Services: More than 4 times per year    Active Member of [employer] or Organizations: Yes    Attends Banker Meetings: More than 4 times per year    Marital Status: Widowed  Intimate Partner Violence: Not At Risk (07/10/2023)   Humiliation, Afraid, Rape, and Kick  questionnaire    Fear of Current or Ex-Partner: No    Emotionally Abused: No    Physically Abused: No    Sexually Abused: No    Outpatient Medications Prior to Visit  Medication Sig Dispense Refill   Cholecalciferol (VITAMIN D -3) 125 MCG (5000 UT) TABS Take 1 tablet by mouth daily.     famotidine  (PEPCID ) 20 MG tablet Take 1  tablet (20 mg total) by mouth 2 (two) times daily. (Patient taking differently: Take 20 mg by mouth daily.) 30 tablet 1   fluticasone  (FLONASE ) 50 MCG/ACT nasal spray Place 2 sprays into both nostrils daily. 16 g 6   Glucose Blood (BLOOD GLUCOSE TEST STRIPS) STRP 1 each by In Vitro route in the morning, at noon, and at bedtime. May substitute to any manufacturer covered by patient's insurance. 100 strip 5   insulin  aspart (NOVOLOG ) 100 UNIT/ML FlexPen Inject 0-6 Units into the skin 3 (three) times daily with meals. Check Blood Glucose (BG) and inject per scale: BG <150= 0 unit; BG 150-200= 1 unit; BG 201-250= 2 unit; BG 251-300= 3 unit; BG 301-350= 4 unit; BG 351-400= 5 unit; BG >400= 6 unit and Call Primary Care. 15 mL 0   Insulin  Glargine (BASAGLAR  KWIKPEN) 100 UNIT/ML Inject 12 Units into the skin daily. May substitute as needed per insurance. (Patient taking differently: Inject 12 Units into the skin at bedtime.) 15 mL 2   Insulin  Pen Needle (PEN NEEDLES) 31G X 5 MM MISC 1 each by Does not apply route 3 (three) times daily. May dispense any manufacturer covered by patient's insurance. 100 each 0   Lancets (ONETOUCH DELICA PLUS LANCET33G) MISC 1 each by Does not apply route 3 (three) times daily. 100 each 0   lidocaine -prilocaine  (EMLA ) cream Apply 1 Application topically as needed. 30 g 2   ondansetron  (ZOFRAN ) 8 MG tablet Take 1 tablet (8 mg total) by mouth every 8 (eight) hours as needed for nausea or vomiting. 20 tablet 1   potassium chloride  (KLOR-CON ) 10 MEQ tablet Take 1 tablet (10 mEq total) by mouth 3 (three) times daily for 7 days, THEN 1 tablet (10 mEq total)  daily for 23 days. 44 tablet 0   prochlorperazine  (COMPAZINE ) 10 MG tablet Take 1 tablet (10 mg total) by mouth every 6 (six) hours as needed for nausea or vomiting. 30 tablet 2   losartan  (COZAAR ) 100 MG tablet Take 1 tablet by mouth once daily 90 tablet 0   NIFEdipine  (ADALAT  CC) 60 MG 24 hr tablet Take 1 tablet by mouth once daily 30 tablet 0   NP THYROID  90 MG tablet Take 1 tablet by mouth once daily 30 tablet 0   No facility-administered medications prior to visit.    Allergies  Allergen Reactions   Lisinopril  Swelling    Angioedema   Oxaliplatin  Shortness Of Breath and Swelling   Statins     Myalgias   Atorvastatin  Other (See Comments)    Muscle aches     ROS Review of Systems  Constitutional:  Negative for chills and fever.  HENT:  Positive for tinnitus. Negative for congestion, sinus pressure, sinus pain and sore throat.   Eyes:  Positive for visual disturbance. Negative for pain and discharge.  Respiratory:  Negative for cough and shortness of breath.   Cardiovascular:  Negative for chest pain and palpitations.  Gastrointestinal:  Negative for abdominal pain, diarrhea, nausea and vomiting.  Endocrine: Negative for polydipsia and polyuria.  Genitourinary:  Negative for dysuria and hematuria.  Musculoskeletal:  Positive for arthralgias. Negative for neck pain and neck stiffness.  Skin:  Negative for rash.  Neurological:  Negative for dizziness and weakness.  Psychiatric/Behavioral:  Negative for agitation and behavioral problems.       Objective:    Physical Exam Vitals reviewed.  Constitutional:      General: She is not in acute distress.    Appearance: She  is obese. She is not diaphoretic.  HENT:     Head: Normocephalic and atraumatic.     Nose: Nose normal.     Mouth/Throat:     Mouth: Mucous membranes are moist.  Eyes:     General: No scleral icterus.    Extraocular Movements: Extraocular movements intact.  Cardiovascular:     Rate and Rhythm: Normal  rate and regular rhythm.     Heart sounds: Normal heart sounds. No murmur heard. Pulmonary:     Breath sounds: Normal breath sounds. No wheezing or rales.  Abdominal:     Palpations: Abdomen is soft.     Tenderness: There is no abdominal tenderness.  Musculoskeletal:     Cervical back: Neck supple. No tenderness.     Right lower leg: No edema.     Left lower leg: No edema.  Skin:    General: Skin is warm.  Neurological:     General: No focal deficit present.     Mental Status: She is alert and oriented to person, place, and time.     Cranial Nerves: No cranial nerve deficit.     Sensory: No sensory deficit.     Motor: No weakness.  Psychiatric:        Mood and Affect: Mood normal.        Behavior: Behavior normal.     BP 138/86 (BP Location: Left Arm)   Pulse 68   Ht 5' 3.5" (1.613 m)   Wt 152 lb 9.6 oz (69.2 kg)   SpO2 98%   BMI 26.61 kg/m  Wt Readings from Last 3 Encounters:  10/21/23 152 lb 9.6 oz (69.2 kg)  10/14/23 153 lb 4.8 oz (69.5 kg)  09/30/23 157 lb 11.2 oz (71.5 kg)    Lab Results  Component Value Date   TSH 3.783 06/27/2023   Lab Results  Component Value Date   WBC 34.4 (H) 10/20/2023   HGB 9.1 (L) 10/20/2023   HCT 26.6 (L) 10/20/2023   MCV 92.7 10/20/2023   PLT 127 (L) 10/20/2023   Lab Results  Component Value Date   NA 141 10/20/2023   K 4.0 10/20/2023   CO2 27 10/20/2023   GLUCOSE 133 (H) 10/20/2023   BUN 15 10/20/2023   CREATININE 0.78 10/20/2023   BILITOT 0.5 10/20/2023   ALKPHOS 453 (H) 10/20/2023   AST 34 10/20/2023   ALT 49 (H) 10/20/2023   PROT 6.6 10/20/2023   ALBUMIN 3.8 10/20/2023   CALCIUM  8.3 (L) 10/20/2023   ANIONGAP 5 10/20/2023   EGFR 42 (L) 06/24/2023   GFR 74.79 07/15/2013   Lab Results  Component Value Date   CHOL 219 (H) 10/10/2022   Lab Results  Component Value Date   HDL 53 10/10/2022   Lab Results  Component Value Date   LDLCALC 145 (H) 10/10/2022   Lab Results  Component Value Date   TRIG 118  10/10/2022   Lab Results  Component Value Date   CHOLHDL 4.1 10/10/2022   Lab Results  Component Value Date   HGBA1C 8.1 (H) 06/24/2023      Assessment & Plan:   Problem List Items Addressed This Visit       Cardiovascular and Mediastinum   Hypertension - Primary   BP Readings from Last 1 Encounters:  10/21/23 138/86   Well-controlled with Losartan  100 mg once daily and nifedipine  60 mg once daily Counseled for compliance with the medications Advised DASH diet and moderate exercise/walking, at least 150 mins/week  Relevant Medications   NIFEdipine  (ADALAT  CC) 60 MG 24 hr tablet   losartan  (COZAAR ) 100 MG tablet   Nonischemic cardiomyopathy (HCC)   On Losartan  Had to Coreg  due to bradycardia Followed by Cardiology      Relevant Medications   NIFEdipine  (ADALAT  CC) 60 MG 24 hr tablet   losartan  (COZAAR ) 100 MG tablet     Respiratory   ILD (interstitial lung disease) (HCC)   HRCT chest reviewed Currently asymptomatic Followed by pulmonology      Pulmonary emphysema (HCC)   Noted on CT chest Currently asymptomatic        Digestive   Pancreatic adenocarcinoma (HCC)   Followed by GI and oncology - undergoing chemotherapy        Endocrine   Hypothyroidism   Lab Results  Component Value Date   TSH 3.783 06/27/2023   On NP thyroid  90 mg QD, had decreased dose in the past due to oversupplementation Discussed about switching to Levothyroxine , she would prefer to stay on NP thyroid  for now Check TSH and free T4      Relevant Medications   thyroid  (NP THYROID ) 90 MG tablet   Type 2 diabetes mellitus with other specified complication (HCC)   POC HbA1c: 7.2, improved than prior Well-controlled for her age On Basaglar  12 units nightly and NovoLog  ISS Associated with HTN and HLD Metformin  discontinued from recent hospitalization Avoid sulfonylureas or incretin analogs due to pancreatic mass Advised to follow diabetic diet On ARB F/u CMP and lipid  panel Diabetic eye exam: Advised to follow up with Ophthalmology for diabetic eye exam      Relevant Medications   losartan  (COZAAR ) 100 MG tablet   Other Relevant Orders   Bayer DCA Hb A1c Waived     Hematopoietic and Hemostatic   Pancytopenia due to chemotherapy (HCC)   Last CBC reviewed Followed by Hemato-Oncology        Other   HLD (hyperlipidemia)   Has uncontrolled HLD Did not tolerate statins Had Pharm.D. consultation for PCSK9 inhibitor therapy through cardiology, was approved for Leqvio       Relevant Medications   NIFEdipine  (ADALAT  CC) 60 MG 24 hr tablet   losartan  (COZAAR ) 100 MG tablet   Other Relevant Orders   Lipid Profile   Encounter for general adult medical examination with abnormal findings   Physical exam as documented. Recent blood tests reviewed from the chart, added Lipid profile.         Meds ordered this encounter  Medications   thyroid  (NP THYROID ) 90 MG tablet    Sig: Take 1 tablet (90 mg total) by mouth daily.    Dispense:  30 tablet    Refill:  5   NIFEdipine  (ADALAT  CC) 60 MG 24 hr tablet    Sig: Take 1 tablet (60 mg total) by mouth daily.    Dispense:  90 tablet    Refill:  1   losartan  (COZAAR ) 100 MG tablet    Sig: Take 1 tablet (100 mg total) by mouth daily.    Dispense:  90 tablet    Refill:  1    Follow-up: Return in about 4 months (around 02/20/2024) for DM and HTN.    [name], MD

## 2023-10-21 NOTE — Assessment & Plan Note (Signed)
Noted on CT chest Currently asymptomatic

## 2023-10-21 NOTE — Assessment & Plan Note (Signed)
 Followed by GI and oncology - undergoing chemotherapy

## 2023-10-21 NOTE — Assessment & Plan Note (Signed)
 Lab Results  Component Value Date   TSH 3.783 06/27/2023   On NP thyroid  90 mg QD, had decreased dose in the past due to oversupplementation Discussed about switching to Levothyroxine , she would prefer to stay on NP thyroid  for now Check TSH and free T4

## 2023-10-21 NOTE — Assessment & Plan Note (Signed)
 On Losartan  Had to Coreg  due to bradycardia Followed by Cardiology

## 2023-10-21 NOTE — Assessment & Plan Note (Signed)
 BP Readings from Last 1 Encounters:  10/21/23 138/86   Well-controlled with Losartan  100 mg once daily and nifedipine  60 mg once daily Counseled for compliance with the medications Advised DASH diet and moderate exercise/walking, at least 150 mins/week

## 2023-10-22 ENCOUNTER — Other Ambulatory Visit: Payer: Self-pay

## 2023-10-27 MED FILL — Fosaprepitant Dimeglumine For IV Infusion 150 MG (Base Eq): INTRAVENOUS | Qty: 5 | Status: AC

## 2023-10-27 NOTE — Assessment & Plan Note (Signed)
 cT2N0M0, stage IB -Patient presented with obstructive jaundice, CT and MRI showed a 2.5 cm mass in the head of the pancreas, with pancreatic duct and biliary duct dilatation.  ERCP was attempted twice but failed due to deformed duodenum -She underwent ERCP and stent placement, EUS with fine-needle biopsy of the pancreatic mass by Dr. Meridee Score on July 08, 2023.  Biopsy confirmed adenocarcinoma. -Due to the tumor invasion to portal vein and SMV, I recommended neoadjuvant chemotherapy FOLFIRINOX, she started on 08/05/2023

## 2023-10-28 ENCOUNTER — Encounter: Payer: Self-pay | Admitting: Hematology

## 2023-10-28 ENCOUNTER — Inpatient Hospital Stay: Attending: Nurse Practitioner | Admitting: Hematology

## 2023-10-28 ENCOUNTER — Inpatient Hospital Stay

## 2023-10-28 VITALS — BP 140/76 | HR 72 | Temp 98.6°F | Resp 22 | Ht 63.5 in | Wt 156.1 lb

## 2023-10-28 DIAGNOSIS — C259 Malignant neoplasm of pancreas, unspecified: Secondary | ICD-10-CM

## 2023-10-28 DIAGNOSIS — Z79630 Long term (current) use of alkylating agent: Secondary | ICD-10-CM | POA: Insufficient documentation

## 2023-10-28 DIAGNOSIS — Z5189 Encounter for other specified aftercare: Secondary | ICD-10-CM | POA: Diagnosis not present

## 2023-10-28 DIAGNOSIS — Z79634 Long term (current) use of topoisomerase inhibitor: Secondary | ICD-10-CM | POA: Diagnosis not present

## 2023-10-28 DIAGNOSIS — C250 Malignant neoplasm of head of pancreas: Secondary | ICD-10-CM | POA: Insufficient documentation

## 2023-10-28 DIAGNOSIS — Z79631 Long term (current) use of antimetabolite agent: Secondary | ICD-10-CM | POA: Diagnosis not present

## 2023-10-28 DIAGNOSIS — Z95828 Presence of other vascular implants and grafts: Secondary | ICD-10-CM

## 2023-10-28 DIAGNOSIS — Z5111 Encounter for antineoplastic chemotherapy: Secondary | ICD-10-CM | POA: Diagnosis not present

## 2023-10-28 DIAGNOSIS — T451X5A Adverse effect of antineoplastic and immunosuppressive drugs, initial encounter: Secondary | ICD-10-CM | POA: Diagnosis not present

## 2023-10-28 DIAGNOSIS — D6481 Anemia due to antineoplastic chemotherapy: Secondary | ICD-10-CM | POA: Diagnosis not present

## 2023-10-28 LAB — CBC WITH DIFFERENTIAL (CANCER CENTER ONLY)
Abs Immature Granulocytes: 1.33 10*3/uL — ABNORMAL HIGH (ref 0.00–0.07)
Basophils Absolute: 0.1 10*3/uL (ref 0.0–0.1)
Basophils Relative: 0 %
Eosinophils Absolute: 0.2 10*3/uL (ref 0.0–0.5)
Eosinophils Relative: 1 %
HCT: 24.6 % — ABNORMAL LOW (ref 36.0–46.0)
Hemoglobin: 8.2 g/dL — ABNORMAL LOW (ref 12.0–15.0)
Immature Granulocytes: 5 %
Lymphocytes Relative: 14 %
Lymphs Abs: 3.8 10*3/uL (ref 0.7–4.0)
MCH: 31.3 pg (ref 26.0–34.0)
MCHC: 33.3 g/dL (ref 30.0–36.0)
MCV: 93.9 fL (ref 80.0–100.0)
Monocytes Absolute: 1.7 10*3/uL — ABNORMAL HIGH (ref 0.1–1.0)
Monocytes Relative: 7 %
Neutro Abs: 19 10*3/uL — ABNORMAL HIGH (ref 1.7–7.7)
Neutrophils Relative %: 73 %
Platelet Count: 181 10*3/uL (ref 150–400)
RBC: 2.62 MIL/uL — ABNORMAL LOW (ref 3.87–5.11)
RDW: 18.5 % — ABNORMAL HIGH (ref 11.5–15.5)
WBC Count: 26 10*3/uL — ABNORMAL HIGH (ref 4.0–10.5)
nRBC: 0.5 % — ABNORMAL HIGH (ref 0.0–0.2)

## 2023-10-28 LAB — CMP (CANCER CENTER ONLY)
ALT: 21 U/L (ref 0–44)
AST: 21 U/L (ref 15–41)
Albumin: 3.5 g/dL (ref 3.5–5.0)
Alkaline Phosphatase: 305 U/L — ABNORMAL HIGH (ref 38–126)
Anion gap: 8 (ref 5–15)
BUN: 12 mg/dL (ref 8–23)
CO2: 26 mmol/L (ref 22–32)
Calcium: 7.6 mg/dL — ABNORMAL LOW (ref 8.9–10.3)
Chloride: 108 mmol/L (ref 98–111)
Creatinine: 0.92 mg/dL (ref 0.44–1.00)
GFR, Estimated: >60 mL/min (ref >60)
Glucose, Bld: 116 mg/dL — ABNORMAL HIGH (ref 70–99)
Potassium: 3 mmol/L — ABNORMAL LOW (ref 3.5–5.1)
Sodium: 142 mmol/L (ref 135–145)
Total Bilirubin: 0.3 mg/dL (ref 0.0–1.2)
Total Protein: 6.2 g/dL — ABNORMAL LOW (ref 6.5–8.1)

## 2023-10-28 MED ORDER — SODIUM CHLORIDE 0.9 % IV SOLN
150.0000 mg | Freq: Once | INTRAVENOUS | Status: AC
  Administered 2023-10-28: 150 mg via INTRAVENOUS
  Filled 2023-10-28: qty 150

## 2023-10-28 MED ORDER — SODIUM CHLORIDE 0.9 % IV SOLN
2400.0000 mg/m2 | INTRAVENOUS | Status: DC
  Administered 2023-10-28: 4350 mg via INTRAVENOUS
  Filled 2023-10-28: qty 87

## 2023-10-28 MED ORDER — ATROPINE SULFATE 1 MG/ML IV SOLN
0.5000 mg | Freq: Once | INTRAVENOUS | Status: AC | PRN
  Administered 2023-10-28: 0.5 mg via INTRAVENOUS
  Filled 2023-10-28: qty 1

## 2023-10-28 MED ORDER — FAMOTIDINE IN NACL 20-0.9 MG/50ML-% IV SOLN
20.0000 mg | Freq: Once | INTRAVENOUS | Status: AC
  Administered 2023-10-28: 20 mg via INTRAVENOUS
  Filled 2023-10-28: qty 50

## 2023-10-28 MED ORDER — PALONOSETRON HCL INJECTION 0.25 MG/5ML
0.2500 mg | Freq: Once | INTRAVENOUS | Status: AC
  Administered 2023-10-28: 0.25 mg via INTRAVENOUS
  Filled 2023-10-28: qty 5

## 2023-10-28 MED ORDER — SODIUM CHLORIDE 0.9% FLUSH
10.0000 mL | Freq: Once | INTRAVENOUS | Status: AC
  Administered 2023-10-28: 10 mL

## 2023-10-28 MED ORDER — DEXTROSE 5 % IV SOLN: INTRAVENOUS | Status: DC

## 2023-10-28 MED ORDER — OXALIPLATIN CHEMO INJECTION 100 MG/20ML
85.0000 mg/m2 | Freq: Once | INTRAVENOUS | Status: AC
  Administered 2023-10-28: 150 mg via INTRAVENOUS
  Filled 2023-10-28: qty 10

## 2023-10-28 MED ORDER — DEXAMETHASONE SODIUM PHOSPHATE 10 MG/ML IJ SOLN
10.0000 mg | Freq: Once | INTRAMUSCULAR | Status: AC
  Administered 2023-10-28: 10 mg via INTRAVENOUS
  Filled 2023-10-28: qty 1

## 2023-10-28 MED ORDER — SODIUM CHLORIDE 0.9 % IV SOLN
400.0000 mg/m2 | Freq: Once | INTRAVENOUS | Status: AC
  Administered 2023-10-28: 728 mg via INTRAVENOUS
  Filled 2023-10-28: qty 17.5

## 2023-10-28 MED ORDER — SODIUM CHLORIDE 0.9 % IV SOLN
260.0000 mg | Freq: Once | INTRAVENOUS | Status: AC
  Administered 2023-10-28: 260 mg via INTRAVENOUS
  Filled 2023-10-28: qty 13

## 2023-10-28 MED ORDER — DIPHENHYDRAMINE HCL 50 MG/ML IJ SOLN
50.0000 mg | Freq: Once | INTRAMUSCULAR | Status: AC
  Administered 2023-10-28: 50 mg via INTRAVENOUS
  Filled 2023-10-28: qty 1

## 2023-10-28 NOTE — Patient Instructions (Signed)
 [facility] - A DEPT OF [facility]  Discharge Instructions: Thank you for choosing [facility] to provide your oncology and hematology care.   If you have a lab appointment with the Cancer Center, please go directly to the Cancer Center and check in at the registration area.   Wear comfortable clothing and clothing appropriate for easy access to any Portacath or PICC line.   We strive to give you quality time with your provider. You may need to reschedule your appointment if you arrive late (15 or more minutes).  Arriving late affects you and other patients whose appointments are after yours.  Also, if you miss three or more appointments without notifying the office, you may be dismissed from the clinic at the provider's discretion.      For prescription refill requests, have your pharmacy contact our office and allow 72 hours for refills to be completed.    Today you received the following chemotherapy and/or immunotherapy agents: Oxaliplatin, Leucovorin, Irinotecan, Fluorouracil      To help prevent nausea and vomiting after your treatment, we encourage you to take your nausea medication as directed.  BELOW ARE SYMPTOMS THAT SHOULD BE REPORTED IMMEDIATELY: *FEVER GREATER THAN 100.4 F (38 C) OR HIGHER *CHILLS OR SWEATING *NAUSEA AND VOMITING THAT IS NOT CONTROLLED WITH YOUR NAUSEA MEDICATION *UNUSUAL SHORTNESS OF BREATH *UNUSUAL BRUISING OR BLEEDING *URINARY PROBLEMS (pain or burning when urinating, or frequent urination) *BOWEL PROBLEMS (unusual diarrhea, constipation, pain near the anus) TENDERNESS IN MOUTH AND THROAT WITH OR WITHOUT PRESENCE OF ULCERS (sore throat, sores in mouth, or a toothache) UNUSUAL RASH, SWELLING OR PAIN  UNUSUAL VAGINAL DISCHARGE OR ITCHING   Items with * indicate a potential emergency and should be followed up as soon as possible or go to the Emergency Department if any problems should occur.  Please show the  CHEMOTHERAPY ALERT CARD or IMMUNOTHERAPY ALERT CARD at check-in to the Emergency Department and triage nurse.  Should you have questions after your visit or need to cancel or reschedule your appointment, please contact [facility] - A DEPT [facility]  Dept: [phone]  and follow the prompts.  Office hours are 8:00 a.m. to 4:30 p.m. Monday - Friday. Please note that voicemails left after 4:00 p.m. may not be returned until the following business day.  We are closed weekends and major holidays. You have access to a nurse at all times for urgent questions. Please call the main number to the clinic Dept: [phone] and follow the prompts.   For any non-urgent questions, you may also contact your provider using MyChart. We now offer e-Visits for anyone 82 and older to request care online for non-urgent symptoms. For details visit [email or web address].   Also download the MyChart app! Go to the app store, search "MyChart", open the app, select [place] [place], and log in with your MyChart username and password.

## 2023-10-28 NOTE — Progress Notes (Signed)
 [facility]   [phone] Fax:([phone]   Clinic Follow up Note   Patient Care Team: [name], MD as PCP - General (Internal Medicine) Eilleen Grates, MD as PCP - Cardiology (Cardiology) Alane Hsu, RN as Oncology Nurse Navigator Auther Bo, RN as Oncology Nurse Navigator Caralyn Chandler, MD as Consulting Physician (General Surgery) Sonja [place], MD as Consulting Physician (Hematology) Colie Dawes, MD as Attending Physician (Radiation Oncology) Myrle Aspen, [facility] (Inactive) as Pharmacist (Pharmacist)  Date of Service:  10/28/2023  CHIEF COMPLAINT: f/u of pancreatic cancer  CURRENT THERAPY:  Neoadjuvant chemotherapy FOLFIRINOX  Oncology History   Pancreatic adenocarcinoma (HCC) cT2N0M0, stage IB -Patient presented with obstructive jaundice, CT and MRI showed a 2.5 cm mass in the head of the pancreas, with pancreatic duct and biliary duct dilatation.  ERCP was attempted twice but failed due to deformed duodenum -She underwent ERCP and stent placement, EUS with fine-needle biopsy of the pancreatic mass by Dr. Brice Campi on July 08, 2023.  Biopsy confirmed adenocarcinoma. -Due to the tumor invasion to portal vein and SMV, I recommended neoadjuvant chemotherapy FOLFIRINOX, she started on 08/05/2023  Assessment & Plan Pancreatic cancer Currently undergoing chemotherapy, cycle 7 of 8. Tolerating treatment better with premedication and extended infusion times. Experiencing dysgeusia and anorexia, but no significant weight loss. Scheduled for a scan next week to evaluate treatment response. Potential for surgery post-chemotherapy, pending scan results and tumor board review on Nov 12, 2023. - Continue chemotherapy at the same dose and schedule cycle 8 for Nov 11, 2023. - Review scan results in tumor conference on Nov 12, 2023. - Coordinate with surgeon Dr. Leighton Punches for potential surgery post-chemotherapy.  Anemia due to  chemotherapy Anemia secondary to chemotherapy with hemoglobin at 8.2 g/dL, trending down. No symptoms of dyspnea or functional impairment. Blood transfusion considered if hemoglobin drops below 8 g/dL. - Monitor hemoglobin levels closely. - Consider blood transfusion if hemoglobin falls below 8 g/dL.  Elevated liver enzymes Previous episodes resolved in the last test. Current liver function tests pending. - Review liver function test results when available.  Plan - Lab reviewed, CMP still pending, if adequate, will proceed cycle 7 chemo at the same dose - She is scheduled for restaging CT scan next week - Follow-up in 2 weeks before cycle 8 chemo - Tumor board discussion on May 21   SUMMARY OF ONCOLOGIC HISTORY: Oncology History  Ductal carcinoma in situ (DCIS) of right breast  01/28/2019 Mammogram   Diagnostic Mammogram 01/28/19  IMPRESSION: Right breast retroareolar 7 mm grouped pleomorphic calcifications are suspicious.   02/02/2019 Initial Biopsy   Diagnosis 02/02/19 Breast, right, needle core biopsy, outer retroareolar - DUCTAL CARCINOMA IN SITU WITH CALCIFICATIONS. Microscopic Comment The ductal carcinoma in situ is intermediate grade. Estrogen and progesterone receptors will be performed.  Results: IMMUNOHISTOCHEMICAL AND MORPHOMETRIC ANALYSIS PERFORMED MANUALLY Estrogen Receptor: 90%, POSITIVE, STRONG STAINING INTENSITY Progesterone Receptor: 20%, POSITIVE, STRONG STAINING INTENSITY    02/02/2019 Cancer Staging   Staging form: Breast, AJCC 8th Edition - Clinical stage from 02/02/2019: Stage 0 (cTis (DCIS), cN0, cM0, G2, ER+, PR+, HER2: Not Assessed) - Signed by Sonja [place], MD on 02/10/2019   02/04/2019 Initial Diagnosis   Ductal carcinoma in situ (DCIS) of right breast   03/18/2019 Surgery   RIGHT BREAST LUMPECTOMY WITH RADIOACTIVE SEED LOCALIZATION by Dr. Alethea Andes 03/18/19   03/18/2019 Pathology Results   FINAL MICROSCOPIC DIAGNOSIS: 03/18/19 A. BREAST, RIGHT, LUMPECTOMY:  -  Ductal carcinoma in situ  with calcifications and necrosis, 1.4 cm.  - Ductal carcinoma in situ focally 0.1 cm from medial and anterior  lumpectomy margins.  - Ductal carcinoma in situ involves the final anterior margin (See part  D).  - Ductal carcinoma in situ focally 0.3 cm from superior lumpectomy  margin.  - Biopsy site and biopsy clip.   B. BREAST, RIGHT LATERAL MARGIN, EXCISION:  - Benign breast tissue.  - No ductal carcinoma in situ.  - Final lateral margin greater than 1 cm.   C. BREAST, RIGHT MEDIAL MARGIN, EXCISION:  - Ductal carcinoma in situ, 0.3 cm.  - Ductal carcinoma in situ with focally 0.3 cm from final medial margin.   D. BREAST, RIGHT ANTERIOR MARGIN, EXCISION:  - Ductal carcinoma in situ, 1.5 cm.  - Ductal carcinoma in situ focally involves final anterior margin.   E. BREAST, RIGHT DEEP MARGIN, EXCISION:  - Fibrocystic changes.  - No ductal carcinoma in situ.  - Final posterior margin greater than 0.7 cm.    04/08/2019 Surgery   RE-EXCISION OF RIGHT BREAST ANTERIOR MARGINS by Dr Alethea Andes 04/08/19   04/08/2019 Pathology Results    DIAGNOSIS: 04/08/19  A. BREAST, RIGHT, ANTERIOR SUPERIOR, EXCISION:  - Intermediate grade ductal carcinoma in situ with necrosis.  - In situ carcinoma is <1 mm (not on ink) from the new anterior superior  margin, multifocally.  - Intraductal papilloma.  - Fibrocystic change.   B. BREAST, RIGHT, ANTERIOR INFERIOR, EXCISION:  - Benign breast tissue with resection changes.   C. BREAST, RIGHT, MEDIAL, EXCISION:  - Benign breast tissue with resection changes.  - Fibroadenomatoid and fibrocystic changes.   05/06/2019 Mammogram   Right Mammogram 05/06/19 IMPRESSION: Suspicious finding with 2 groups of microcalcifications in the medial right breast. The anterior group measures 2 x 2 x 2 mm. The more posterior group measures 4 x 3 x 4 mm.   05/12/2019 Pathology Results   Diagnosis 05/12/19 1. Breast, right, needle core biopsy,  medial - DUCTAL CARCINOMA IN SITU WITH CALCIFICATIONS. - SEE MICROSCOPIC DESCRIPTION. 2. Breast, right, needle core biopsy, medial - DUCTAL CARCINOMA IN SITU WITH CALCIFICATIONS. - SEE MICROSCOPIC DESCRIPTION. Results: IMMUNOHISTOCHEMICAL AND MORPHOMETRIC ANALYSIS PERFORMED MANUALLY Estrogen Receptor: 100%, POSITIVE, STRONG STAINING INTENSITY Progesterone Receptor: 0%, NEGATIVE COMMENT: The negative hormone receptor study(ies) in this case has an internal positive control. Results: IMMUNOHISTOCHEMICAL AND MORPHOMETRIC ANALYSIS PERFORMED MANUALLY Estrogen Receptor: 100%, POSITIVE, STRONG STAINING INTENSITY Progesterone Receptor: 0%, NEGATIVE COMMENT: The negative hormone receptor study(ies) in this case has an internal positive control.   07/26/2019 Surgery   RIGHT MASTECTOMY WITH SENTINEL NODE BIOPSY by Dr. Alethea Andes 07/26/19   07/26/2019 Pathology Results   FINAL MICROSCOPIC DIAGNOSIS: 07/26/19  A. LYMPH NODE, RIGHT #1, SENTINEL,  BIOPSY:  -  No carcinoma identified in one lymph node (0/1)   B. BREAST, RIGHT, MASTECTOMY:  -  Ductal carcinoma in situ, intermediate grade, 0.6 cm  -  Margins uninvolved by carcinoma (greater than 1 cm; posterior margin)  -  Previous biopsy site changes (x2)  -  See oncology table below   07/26/2019 Cancer Staging   Staging form: Breast, AJCC 8th Edition - Pathologic stage from 07/26/2019: Stage 0 (pTis (DCIS), pN0, cM0, G2, ER+, PR+, HER2: Not Assessed) - Signed by Sonja [place], MD on 08/10/2019   08/15/2023 Genetic Testing   Negative [facility] CancerNext-Expanded +RNAinsight Panel.  VUS in ATM at c.4910-891G>A and SDHA at c.-1C>G.  Report date is 08/15/2023.   The CancerNext-Expanded gene panel offered by [employer]  and includes sequencing, rearrangement, and RNA analysis for the following 76 genes: AIP, ALK, APC, ATM, AXIN2, BAP1, BARD1, BMPR1A, BRCA1, BRCA2, BRIP1, CDC73, CDH1, CDK4, CDKN1B, CDKN2A, CEBPA, CHEK2, CTNNA1, DDX41, DICER1, ETV6, FH, FLCN, GATA2, LZTR1,  MAX, MBD4, MEN1, MET, MLH1, MSH2, MSH3, MSH6, MUTYH, NF1, NF2, NTHL1, PALB2, PHOX2B, PMS2, POT1, PRKAR1A, PTCH1, PTEN, RAD51C, RAD51D, RB1, RET, RUNX1, SDHA, SDHAF2, SDHB, SDHC, SDHD, SMAD4, SMARCA4, SMARCB1, SMARCE1, STK11, SUFU, TMEM127, TP53, TSC1, TSC2, VHL, and WT1 (sequencing and deletion/duplication); EGFR, HOXB13, KIT, MITF, PDGFRA, POLD1, and POLE (sequencing only); EPCAM and GREM1 (deletion/duplication only).    Pancreatic adenocarcinoma (HCC)  07/07/2022 Cancer Staging   Staging form: Exocrine Pancreas, AJCC 8th Edition - Clinical stage from 07/07/2022: Stage IB (cT2, cN0, cM0) - Signed by Sonja [place], MD on 07/22/2023 Stage prefix: Initial diagnosis Total positive nodes: 0   06/13/2023 Initial Diagnosis   Pancreatic adenocarcinoma (HCC)   08/06/2023 -  Chemotherapy   Patient is on Treatment Plan : PANCREAS Modified FOLFIRINOX q14d x 4 cycles     08/15/2023 Genetic Testing   Negative [facility] CancerNext-Expanded +RNAinsight Panel.  VUS in ATM at c.4910-891G>A and SDHA at c.-1C>G.  Report date is 08/15/2023.   The CancerNext-Expanded gene panel offered by [facility] and includes sequencing, rearrangement, and RNA analysis for the following 76 genes: AIP, ALK, APC, ATM, AXIN2, BAP1, BARD1, BMPR1A, BRCA1, BRCA2, BRIP1, CDC73, CDH1, CDK4, CDKN1B, CDKN2A, CEBPA, CHEK2, CTNNA1, DDX41, DICER1, ETV6, FH, FLCN, GATA2, LZTR1, MAX, MBD4, MEN1, MET, MLH1, MSH2, MSH3, MSH6, MUTYH, NF1, NF2, NTHL1, PALB2, PHOX2B, PMS2, POT1, PRKAR1A, PTCH1, PTEN, RAD51C, RAD51D, RB1, RET, RUNX1, SDHA, SDHAF2, SDHB, SDHC, SDHD, SMAD4, SMARCA4, SMARCB1, SMARCE1, STK11, SUFU, TMEM127, TP53, TSC1, TSC2, VHL, and WT1 (sequencing and deletion/duplication); EGFR, HOXB13, KIT, MITF, PDGFRA, POLD1, and POLE (sequencing only); EPCAM and GREM1 (deletion/duplication only).       Discussed the use of AI scribe software for clinical note transcription with the patient, who gave verbal consent to proceed.  History of Present  Illness Adrienne [name] is a 78 year old female with pancreatic cancer who presents for follow-up.  She is undergoing her seventh cycle of chemotherapy without adverse reactions, managed with premedication and extended infusion time. Her appetite is poor, and she struggles with food palatability, yet she has gained three pounds since the last treatment. Hemoglobin is 8.2 and trending down due to chemotherapy. White blood cell count is elevated from a recent injection, but there is no fever. Platelet count is normal. Previous liver enzyme elevations have resolved, and she awaits current kidney and liver function test results.  No fever, diarrhea, numbness, tingling, or shortness of breath. She performs daily activities independently. No swelling in her legs, cough, phlegm, or stomach pain. Her port shows no signs of redness or pain. No medication refills are needed at this time.     All other systems were reviewed with the patient and are negative.  MEDICAL HISTORY:  Past Medical History:  Diagnosis Date   Allergy    Ambulates with cane    straight cane   Arthritis    knee, back   CHF (congestive heart failure) (HCC)    Ductal carcinoma in situ of breast 12/2018   R Breast-mastectomy only   Gout    History of blood transfusion 1986   w/ Hysterectomy surgery   Hyperlipidemia    diet controlled - no meds   Hypertension    Hypothyroidism    Pelvic kidney    lower right pelvic kidney  SBO (small bowel obstruction) (HCC) 10/2016   surgery    Sleep apnea    Mild - no mask needed per sleep study   Smoker    quit smoking 2018   Type 2 diabetes mellitus (HCC)     SURGICAL HISTORY: Past Surgical History:  Procedure Laterality Date   ABDOMINAL HYSTERECTOMY  1986   COMPLETE-precancerous   APPENDECTOMY     pt states it was removed when gallbladder was removed.   AUGMENTATION MAMMAPLASTY     BACK SURGERY     lower back   BILIARY BRUSHING  07/08/2023   Procedure: BILIARY  BRUSHING;  Surgeon: Brice Campi Albino Alu., MD;  Location: [facility];  Service: Gastroenterology;;   BILIARY STENT PLACEMENT N/A 07/08/2023   Procedure: BILIARY STENT PLACEMENT;  Surgeon: Normie Becton., MD;  Location: [facility];  Service: Gastroenterology;  Laterality: N/A;   BIOPSY  06/14/2023   Procedure: BIOPSY;  Surgeon: Kenney Peacemaker, MD;  Location: [facility];  Service: Gastroenterology;;   BIOPSY  07/08/2023   Procedure: BIOPSY;  Surgeon: Normie Becton., MD;  Location: [facility];  Service: Gastroenterology;;   BREAST BIOPSY Right 05/12/2019   times 2   BREAST LUMPECTOMY WITH RADIOACTIVE SEED LOCALIZATION Right 03/18/2019   Procedure: RIGHT BREAST LUMPECTOMY WITH RADIOACTIVE SEED LOCALIZATION;  Surgeon: Caralyn Chandler, MD;  Location: [facility];  Service: General;  Laterality: Right;   CHOLECYSTECTOMY     COLONOSCOPY  02/15/2008   [facility]    COLONOSCOPY WITH PROPOFOL   02/27/2018   Dr.Nandigam   ECTOPIC PREGNANCY SURGERY     ERCP N/A 06/14/2023   Procedure: ENDOSCOPIC RETROGRADE CHOLANGIOPANCREATOGRAPHY (ERCP);  Surgeon: Kenney Peacemaker, MD;  Location: [facility];  Service: Gastroenterology;  Laterality: N/A;   ERCP N/A 06/27/2023   Procedure: ENDOSCOPIC RETROGRADE CHOLANGIOPANCREATOGRAPHY (ERCP);  Surgeon: Lajuan Pila, MD;  Location: [facility];  Service: Gastroenterology;  Laterality: N/A;   ERCP N/A 07/08/2023   Procedure: ENDOSCOPIC RETROGRADE CHOLANGIOPANCREATOGRAPHY (ERCP);  Surgeon: Normie Becton., MD;  Location: [facility];  Service: Gastroenterology;  Laterality: N/A;   ESOPHAGOGASTRODUODENOSCOPY N/A 07/08/2023   Procedure: ESOPHAGOGASTRODUODENOSCOPY (EGD);  Surgeon: Normie Becton., MD;  Location: [facility];  Service: Gastroenterology;  Laterality: N/A;   ESOPHAGOGASTRODUODENOSCOPY (EGD) WITH PROPOFOL  N/A 06/14/2023   Procedure: ESOPHAGOGASTRODUODENOSCOPY (EGD) WITH PROPOFOL ;  Surgeon: Kenney Peacemaker, MD;  Location: [facility];  Service: Gastroenterology;  Laterality: N/A;   EUS N/A 07/08/2023   Procedure: UPPER ENDOSCOPIC ULTRASOUND (EUS) LINEAR;  Surgeon: Normie Becton., MD;  Location: [facility];  Service: Gastroenterology;  Laterality: N/A;   FINE NEEDLE ASPIRATION N/A 07/08/2023   Procedure: FINE NEEDLE ASPIRATION (FNA) LINEAR;  Surgeon: Normie Becton., MD;  Location: [facility];  Service: Gastroenterology;  Laterality: N/A;   IR IMAGING GUIDED PORT INSERTION  07/29/2023   JOINT REPLACEMENT     Left hip total Dr. France Ina 10-01-17   MALONEY DILATION  06/14/2023   Procedure: Londa Rival DILATION;  Surgeon: Kenney Peacemaker, MD;  Location: [facility];  Service: Gastroenterology;;   MASTECTOMY Right 07/26/2019   PANCREATIC STENT PLACEMENT  06/14/2023   Procedure: PANCREATIC STENT PLACEMENT;  Surgeon: Kenney Peacemaker, MD;  Location: [facility];  Service: Gastroenterology;;   PANCREATIC STENT PLACEMENT  06/27/2023   Procedure: PANCREATIC STENT PLACEMENT;  Surgeon: Lajuan Pila, MD;  Location: [facility];  Service: Gastroenterology;;   POLYPECTOMY     polypectomy-oropharynx     RE-EXCISION OF BREAST CANCER,SUPERIOR MARGINS Right 04/08/2019   Procedure: RE-EXCISION OF RIGHT BREAST  ANTERIOR MARGINS;  Surgeon: Lillette Reid III, MD;  Location: [facility];  Service: General;  Laterality: Right;   right knee meniscus     SBO  10/2016   small bowel obstruction   SIMPLE MASTECTOMY WITH AXILLARY SENTINEL NODE BIOPSY Right 07/26/2019   Procedure: RIGHT MASTECTOMY WITH SENTINEL NODE BIOPSY;  Surgeon: Caralyn Chandler, MD;  Location: [facility];  Service: General;  Laterality: Right;   SPHINCTEROTOMY  06/27/2023   Procedure: SPHINCTEROTOMY;  Surgeon: Lajuan Pila, MD;  Location: [facility];  Service: Gastroenterology;;   [address]  07/08/2023   Procedure: [address];  Surgeon: Normie Becton., MD;  Location: [facility];  Service: Gastroenterology;;   TOTAL HIP ARTHROPLASTY  Left 10/01/2017   Procedure: LEFT  TOTAL HIP ARTHROPLASTY ANTERIOR APPROACH;  Surgeon: Liliane Rei, MD;  Location: [facility];  Service: Orthopedics;  Laterality: Left;   TOTAL HIP ARTHROPLASTY Right 03/11/2018   Procedure: RIGHT TOTAL HIP ARTHROPLASTY ANTERIOR APPROACH;  Surgeon: Liliane Rei, MD;  Location: [facility];  Service: Orthopedics;  Laterality: Right;    UPPER GASTROINTESTINAL ENDOSCOPY      I have reviewed the social history and family history with the patient and they are unchanged from previous note.  ALLERGIES:  is allergic to lisinopril , oxaliplatin , statins, and atorvastatin .  MEDICATIONS:  Current Outpatient Medications  Medication Sig Dispense Refill   Cholecalciferol (VITAMIN D -3) 125 MCG (5000 UT) TABS Take 1 tablet by mouth daily.     famotidine  (PEPCID ) 20 MG tablet Take 1 tablet (20 mg total) by mouth 2 (two) times daily. (Patient taking differently: Take 20 mg by mouth daily.) 30 tablet 1   fluticasone  (FLONASE ) 50 MCG/ACT nasal spray Place 2 sprays into both nostrils daily. 16 g 6   Glucose Blood (BLOOD GLUCOSE TEST STRIPS) STRP 1 each by In Vitro route in the morning, at noon, and at bedtime. May substitute to any manufacturer covered by patient's insurance. 100 strip 5   insulin  aspart (NOVOLOG ) 100 UNIT/ML FlexPen Inject 0-6 Units into the skin 3 (three) times daily with meals. Check Blood Glucose (BG) and inject per scale: BG <150= 0 unit; BG 150-200= 1 unit; BG 201-250= 2 unit; BG 251-300= 3 unit; BG 301-350= 4 unit; BG 351-400= 5 unit; BG >400= 6 unit and Call Primary Care. 15 mL 0   Insulin  Glargine (BASAGLAR  KWIKPEN) 100 UNIT/ML Inject 12 Units into the skin daily. May substitute as needed per insurance. (Patient taking differently: Inject 12 Units into the skin at bedtime.) 15 mL 2   Insulin  Pen Needle (PEN NEEDLES) 31G X 5 MM MISC 1 each by Does not apply route 3 (three) times daily. May dispense any manufacturer covered by patient's insurance. 100 each 0    Lancets (ONETOUCH DELICA PLUS LANCET33G) MISC 1 each by Does not apply route 3 (three) times daily. 100 each 0   lidocaine -prilocaine  (EMLA ) cream Apply 1 Application topically as needed. 30 g 2   losartan  (COZAAR ) 100 MG tablet Take 1 tablet (100 mg total) by mouth daily. 90 tablet 1   NIFEdipine  (ADALAT  CC) 60 MG 24 hr tablet Take 1 tablet (60 mg total) by mouth daily. 90 tablet 1   ondansetron  (ZOFRAN ) 8 MG tablet Take 1 tablet (8 mg total) by mouth every 8 (eight) hours as needed for nausea or vomiting. 20 tablet 1   potassium chloride  (KLOR-CON ) 10 MEQ tablet Take 1 tablet (10 mEq total) by mouth 3 (three) times daily for 7 days, THEN 1 tablet (10  mEq total) daily for 23 days. 44 tablet 0   prochlorperazine  (COMPAZINE ) 10 MG tablet Take 1 tablet (10 mg total) by mouth every 6 (six) hours as needed for nausea or vomiting. 30 tablet 2   thyroid  (NP THYROID ) 90 MG tablet Take 1 tablet (90 mg total) by mouth daily. 30 tablet 5   No current facility-administered medications for this visit.    PHYSICAL EXAMINATION: ECOG PERFORMANCE STATUS: 1 - Symptomatic but completely ambulatory  Vitals:   10/28/23 0900 10/28/23 0903  BP: (!) 146/76 (!) 140/76  Pulse: 72   Resp: (!) 22   Temp: 98.6 F (37 C)   SpO2: 98%    Wt Readings from Last 3 Encounters:  10/28/23 156 lb 1.6 oz (70.8 kg)  10/21/23 152 lb 9.6 oz (69.2 kg)  10/14/23 153 lb 4.8 oz (69.5 kg)     GENERAL:alert, no distress and comfortable SKIN: skin color, texture, turgor are normal, no rashes or significant lesions EYES: normal, Conjunctiva are pink and non-injected, sclera clear NECK: supple, thyroid  normal size, non-tender, without nodularity LYMPH:  no palpable lymphadenopathy in the cervical, axillary  LUNGS: clear to auscultation and percussion with normal breathing effort HEART: regular rate & rhythm and no murmurs and no lower extremity edema ABDOMEN:abdomen soft, non-tender and normal bowel sounds Musculoskeletal:no  cyanosis of digits and no clubbing  NEURO: alert & oriented x 3 with fluent speech, no focal motor/sensory deficits  Physical Exam    LABORATORY DATA:  I have reviewed the data as listed    Latest Ref Rng & Units 10/28/2023    8:41 AM 10/20/2023    2:48 PM 10/14/2023    8:23 AM  CBC  WBC 4.0 - 10.5 K/uL 26.0  34.4  23.6   Hemoglobin 12.0 - 15.0 g/dL 8.2  9.1  9.0   Hematocrit 36.0 - 46.0 % 24.6  26.6  26.6   Platelets 150 - 400 K/uL 181  127  249         Latest Ref Rng & Units 10/20/2023    2:48 PM 10/14/2023    8:23 AM 09/30/2023    9:59 AM  CMP  Glucose 70 - 99 mg/dL 409  811  914   BUN 8 - 23 mg/dL 15  11  10    Creatinine 0.44 - 1.00 mg/dL 7.82  9.56  2.13   Sodium 135 - 145 mmol/L 141  144  141   Potassium 3.5 - 5.1 mmol/L 4.0  3.7  3.3   Chloride 98 - 111 mmol/L 109  109  106   CO2 22 - 32 mmol/L 27  27  27    Calcium  8.9 - 10.3 mg/dL 8.3  8.3  8.8   Total Protein 6.5 - 8.1 g/dL 6.6  6.7  7.1   Total Bilirubin 0.0 - 1.2 mg/dL 0.5  1.1  0.3   Alkaline Phos 38 - 126 U/L 453  691  761   AST 15 - 41 U/L 34  307  69   ALT 0 - 44 U/L 49  176  90       RADIOGRAPHIC STUDIES: I have personally reviewed the radiological images as listed and agreed with the findings in the report. No results found.    No orders of the defined types were placed in this encounter.  All questions were answered. The patient knows to call the clinic with any problems, questions or concerns. No barriers to learning was detected. The total time spent in the  appointment was 25 minutes.     Sonja [place], MD 10/28/2023

## 2023-10-29 ENCOUNTER — Other Ambulatory Visit: Payer: Self-pay

## 2023-10-29 ENCOUNTER — Encounter (HOSPITAL_BASED_OUTPATIENT_CLINIC_OR_DEPARTMENT_OTHER): Payer: Self-pay

## 2023-10-29 ENCOUNTER — Ambulatory Visit: Payer: Self-pay

## 2023-10-29 ENCOUNTER — Emergency Department (HOSPITAL_BASED_OUTPATIENT_CLINIC_OR_DEPARTMENT_OTHER)

## 2023-10-29 ENCOUNTER — Emergency Department (HOSPITAL_BASED_OUTPATIENT_CLINIC_OR_DEPARTMENT_OTHER)
Admission: EM | Admit: 2023-10-29 | Discharge: 2023-10-29 | Disposition: A | Discharge status: Home / Self Care | Attending: Emergency Medicine | Admitting: Emergency Medicine

## 2023-10-29 ENCOUNTER — Emergency Department (HOSPITAL_BASED_OUTPATIENT_CLINIC_OR_DEPARTMENT_OTHER)
Admission: EM | Admit: 2023-10-29 | Discharge: 2023-10-29 | Disposition: A | Discharge status: Home / Self Care | Source: Home / Self Care | Attending: Emergency Medicine | Admitting: Emergency Medicine

## 2023-10-29 ENCOUNTER — Encounter (HOSPITAL_BASED_OUTPATIENT_CLINIC_OR_DEPARTMENT_OTHER): Payer: Self-pay | Admitting: Emergency Medicine

## 2023-10-29 DIAGNOSIS — L7622 Postprocedural hemorrhage and hematoma of skin and subcutaneous tissue following other procedure: Secondary | ICD-10-CM | POA: Insufficient documentation

## 2023-10-29 DIAGNOSIS — I509 Heart failure, unspecified: Secondary | ICD-10-CM | POA: Insufficient documentation

## 2023-10-29 DIAGNOSIS — E039 Hypothyroidism, unspecified: Secondary | ICD-10-CM | POA: Insufficient documentation

## 2023-10-29 DIAGNOSIS — T8130XA Disruption of wound, unspecified, initial encounter: Secondary | ICD-10-CM | POA: Diagnosis not present

## 2023-10-29 DIAGNOSIS — E119 Type 2 diabetes mellitus without complications: Secondary | ICD-10-CM | POA: Insufficient documentation

## 2023-10-29 DIAGNOSIS — Z87891 Personal history of nicotine dependence: Secondary | ICD-10-CM | POA: Insufficient documentation

## 2023-10-29 DIAGNOSIS — S0181XA Laceration without foreign body of other part of head, initial encounter: Secondary | ICD-10-CM | POA: Insufficient documentation

## 2023-10-29 DIAGNOSIS — I110 Hypertensive heart disease with heart failure: Secondary | ICD-10-CM | POA: Insufficient documentation

## 2023-10-29 DIAGNOSIS — S8992XA Unspecified injury of left lower leg, initial encounter: Secondary | ICD-10-CM | POA: Diagnosis not present

## 2023-10-29 DIAGNOSIS — W1830XA Fall on same level, unspecified, initial encounter: Secondary | ICD-10-CM | POA: Diagnosis not present

## 2023-10-29 DIAGNOSIS — Z79899 Other long term (current) drug therapy: Secondary | ICD-10-CM | POA: Insufficient documentation

## 2023-10-29 DIAGNOSIS — S0990XA Unspecified injury of head, initial encounter: Secondary | ICD-10-CM | POA: Diagnosis present

## 2023-10-29 DIAGNOSIS — Z794 Long term (current) use of insulin: Secondary | ICD-10-CM | POA: Insufficient documentation

## 2023-10-29 DIAGNOSIS — R58 Hemorrhage, not elsewhere classified: Secondary | ICD-10-CM

## 2023-10-29 DIAGNOSIS — T8131XA Disruption of external operation (surgical) wound, not elsewhere classified, initial encounter: Secondary | ICD-10-CM | POA: Diagnosis not present

## 2023-10-29 MED ORDER — LIDOCAINE-EPINEPHRINE (PF) 2 %-1:200000 IJ SOLN
INTRAMUSCULAR | Status: AC
  Filled 2023-10-29: qty 20

## 2023-10-29 MED ORDER — LIDOCAINE-PRILOCAINE 2.5-2.5 % EX CREA
TOPICAL_CREAM | Freq: Once | CUTANEOUS | Status: AC
  Filled 2023-10-29: qty 5

## 2023-10-29 MED ORDER — LIDOCAINE-PRILOCAINE 2.5-2.5 % EX CREA
1.0000 | TOPICAL_CREAM | Freq: Once | CUTANEOUS | Status: AC
  Administered 2023-10-29: 1 via TOPICAL
  Filled 2023-10-29: qty 5

## 2023-10-29 NOTE — ED Triage Notes (Signed)
 Seen here last night for fall. Placed 4 stitches in chin. Came back today for bleeding around stitches.

## 2023-10-29 NOTE — ED Triage Notes (Signed)
 Pt fell this morning from standing, hit her chin on a knob on a dresser. Has a centimeter lac to her chin. Hit her head and her left knee. Denies any blood thinner.

## 2023-10-29 NOTE — Telephone Encounter (Signed)
 Copied from CRM [id number]. Topic: Clinical - Red Word Triage >> Oct 29, 2023  3:55 PM Bearl Botts E wrote: Red Word that prompted transfer to Nurse Triage: Suture complications, bleeding   Chief Complaint: Suture popped  Symptoms: Bleeding from suture site, skin gaping from where a suture popped  Disposition: [x] ED /[] Urgent Care (no appt availability in office) / [] Appointment(In office/virtual)/ []  [facility]/ [] Home Care/ [] Refused Recommended Disposition /[] [facility]/ []  Follow-up with PCP Additional Notes: patient reports she was in the ED this morning for a fall and had sutures placed in her chin. She states when she changed the bandage she noticed the wound was bleeding and that a suture had popped. Patient advised to go back to the ED where they will be able to place another suture if needed. Patient understood and is agreeable with this plan.     Reason for Disposition  [1] Wound gaping open after sutures (or staples) AND [2] < 48 hours since wound re-opened  Answer Assessment - Initial Assessment Questions 1. LOCATION: "Where are the sutures (or staples) located?"      Chin 2. DATE IN: "When were the sutures (or staples) put in?"       Today  Protocols used: Suture or Staple Questions-A-AH

## 2023-10-29 NOTE — ED Provider Notes (Signed)
 [place] EMERGENCY DEPARTMENT AT [facility] Provider Note   CSN: [id number] Arrival date & time: 10/29/23  4098     History  Chief Complaint  Patient presents with   Adrienne [name]    Adrienne [name] is a 78 y.o. female.  78 yo F who fell tonight because she was shuffing her feet and her foot got caught on something, hit left knee but able to walk. Also hit top of head and chin on something causing a contusion to scalp and a laceration to her chin. No LOC, emesis, neuro changes. No malalignment of her teeth or concern for mouth injury.    Fall       Home Medications Prior to Admission medications   Medication Sig Start Date End Date Taking? Authorizing Provider  Cholecalciferol (VITAMIN D -3) 125 MCG (5000 UT) TABS Take 1 tablet by mouth daily.    [provider]  famotidine  (PEPCID ) 20 MG tablet Take 1 tablet (20 mg total) by mouth 2 (two) times daily. Patient taking differently: Take 20 mg by mouth daily. 05/15/23   Soldatova, Liuba, MD  fluticasone  (FLONASE ) 50 MCG/ACT nasal spray Place 2 sprays into both nostrils daily. 05/15/23   Soldatova, Liuba, MD  Glucose Blood (BLOOD GLUCOSE TEST STRIPS) STRP 1 each by In Vitro route in the morning, at noon, and at bedtime. May substitute to any manufacturer covered by patient's insurance. 07/22/23   [name], MD  insulin  aspart (NOVOLOG ) 100 UNIT/ML FlexPen Inject 0-6 Units into the skin 3 (three) times daily with meals. Check Blood Glucose (BG) and inject per scale: BG <150= 0 unit; BG 150-200= 1 unit; BG 201-250= 2 unit; BG 251-300= 3 unit; BG 301-350= 4 unit; BG 351-400= 5 unit; BG >400= 6 unit and Call Primary Care. 06/28/23   Rai, Hurman Maiden, MD  Insulin  Glargine (BASAGLAR  KWIKPEN) 100 UNIT/ML Inject 12 Units into the skin daily. May substitute as needed per insurance. Patient taking differently: Inject 12 Units into the skin at bedtime. 06/28/23   Rai, Hurman Maiden, MD  Insulin  Pen Needle (PEN NEEDLES) 31G X 5 MM  MISC 1 each by Does not apply route 3 (three) times daily. May dispense any manufacturer covered by patient's insurance. 09/03/23   [name], MD  Lancets [facility] DELICA PLUS [place]) MISC 1 each by Does not apply route 3 (three) times daily. 09/01/23   [name], MD  lidocaine -prilocaine  (EMLA ) cream Apply 1 Application topically as needed. 07/23/23   Sonja Shishmaref, MD  losartan  (COZAAR ) 100 MG tablet Take 1 tablet (100 mg total) by mouth daily. 10/21/23   [name], MD  NIFEdipine  (ADALAT  CC) 60 MG 24 hr tablet Take 1 tablet (60 mg total) by mouth daily. 10/21/23   [name], MD  ondansetron  (ZOFRAN ) 8 MG tablet Take 1 tablet (8 mg total) by mouth every 8 (eight) hours as needed for nausea or vomiting. 07/23/23   Sonja Wetmore, MD  potassium chloride  (KLOR-CON ) 10 MEQ tablet Take 1 tablet (10 mEq total) by mouth 3 (three) times daily for 7 days, THEN 1 tablet (10 mEq total) daily for 23 days. 09/30/23 10/30/23  Sonja [place], MD  prochlorperazine  (COMPAZINE ) 10 MG tablet Take 1 tablet (10 mg total) by mouth every 6 (six) hours as needed for nausea or vomiting. 07/23/23   Sonja [place], MD  thyroid  (NP THYROID ) 90 MG tablet Take 1 tablet (90 mg total) by mouth daily. 10/21/23   [name], MD  Allergies    Lisinopril , Oxaliplatin , Statins, and Atorvastatin     Review of Systems   Review of Systems  Physical Exam Updated Vital Signs BP (!) 151/94 (BP Location: Right Arm)   Pulse 79   Temp 98.2 F (36.8 C) (Oral)   Resp 18   Ht 5\' 4"  (1.626 m)   Wt 70.3 kg   SpO2 100%   BMI 26.61 kg/m  Physical Exam Vitals and nursing note reviewed.  Constitutional:      Appearance: She is well-developed.  HENT:     Head: Normocephalic. Contusion (scalp) and laceration (chin, stellate on top, linear on bottom, small amount of oozing) present.  Cardiovascular:     Rate and Rhythm: Normal rate and regular rhythm.  Pulmonary:     Effort: No respiratory distress.     Breath sounds: No  stridor.  Abdominal:     General: There is no distension.  Musculoskeletal:     Cervical back: Normal range of motion.  Neurological:     Mental Status: She is alert.     ED Results / Procedures / Treatments   Labs (all labs ordered are listed, but only abnormal results are displayed) Labs Reviewed - No data to display  EKG None  Radiology CT Head Wo Contrast Result Date: 10/29/2023 CLINICAL DATA:  Blunt facial trauma. Fell this morning from standing. Chin hit knob on a dresser. Cm laceration to chin. Also hit head and left knee. EXAM: CT HEAD WITHOUT CONTRAST CT MAXILLOFACIAL WITHOUT CONTRAST TECHNIQUE: Multidetector CT imaging of the head and maxillofacial structures were performed using the standard protocol without intravenous contrast. Multiplanar CT image reconstructions of the maxillofacial structures were also generated. RADIATION DOSE REDUCTION: This exam was performed according to the departmental dose-optimization program which includes automated exposure control, adjustment of the mA and/or kV according to patient size and/or use of iterative reconstruction technique. COMPARISON:  CT head 04/11/2008 FINDINGS: CT HEAD FINDINGS Brain: No evidence of acute infarction, hemorrhage, hydrocephalus, extra-axial collection or mass lesion/mass effect. Vascular: No hyperdense vessel or unexpected calcification. Skull: Normal. Negative for fracture or focal lesion. Other: None. CT MAXILLOFACIAL FINDINGS Osseous: No acute fracture or mandibular dislocation. Orbits: Negative. No traumatic or inflammatory finding. Sinuses: Clear. Soft tissues: Laceration/contusion about the chin with gas in the soft tissues and musculature about the left mandible. IMPRESSION: 1. No acute intracranial abnormality. 2. No acute facial fracture. Laceration/contusion about the chin. Electronically Signed   By: Rozell Cornet M.D.   On: 10/29/2023 03:40   CT Maxillofacial Wo Contrast Result Date: 10/29/2023 CLINICAL  DATA:  Blunt facial trauma. Fell this morning from standing. Chin hit knob on a dresser. Cm laceration to chin. Also hit head and left knee. EXAM: CT HEAD WITHOUT CONTRAST CT MAXILLOFACIAL WITHOUT CONTRAST TECHNIQUE: Multidetector CT imaging of the head and maxillofacial structures were performed using the standard protocol without intravenous contrast. Multiplanar CT image reconstructions of the maxillofacial structures were also generated. RADIATION DOSE REDUCTION: This exam was performed according to the departmental dose-optimization program which includes automated exposure control, adjustment of the mA and/or kV according to patient size and/or use of iterative reconstruction technique. COMPARISON:  CT head 04/11/2008 FINDINGS: CT HEAD FINDINGS Brain: No evidence of acute infarction, hemorrhage, hydrocephalus, extra-axial collection or mass lesion/mass effect. Vascular: No hyperdense vessel or unexpected calcification. Skull: Normal. Negative for fracture or focal lesion. Other: None. CT MAXILLOFACIAL FINDINGS Osseous: No acute fracture or mandibular dislocation. Orbits: Negative. No traumatic or inflammatory finding. Sinuses: Clear. Soft tissues:  Laceration/contusion about the chin with gas in the soft tissues and musculature about the left mandible. IMPRESSION: 1. No acute intracranial abnormality. 2. No acute facial fracture. Laceration/contusion about the chin. Electronically Signed   By: Rozell Cornet M.D.   On: 10/29/2023 03:40    Procedures .Laceration Repair  Date/Time: 10/29/2023 3:57 AM  Performed by: Eve Hinders, MD Authorized by: Eve Hinders, MD   Consent:    Consent obtained:  Verbal   Consent given by:  Patient   Risks, benefits, and alternatives were discussed: yes     Risks discussed:  Infection, need for additional repair, nerve damage, poor wound healing, poor cosmetic result, pain and retained foreign body   Alternatives discussed:  No treatment, delayed treatment,  observation and referral Universal protocol:    Procedure explained and questions answered to patient or proxy's satisfaction: yes     Immediately prior to procedure, a time out was called: yes     Patient identity confirmed:  Verbally with patient and arm band Anesthesia:    Anesthesia method:  Local infiltration   Local anesthetic:  Lidocaine  2% WITH epi Laceration details:    Location:  Face   Face location:  Chin   Length (cm):  1   Depth (mm):  3 Pre-procedure details:    Preparation:  Patient was prepped and draped in usual sterile fashion Exploration:    Wound exploration: wound explored through full range of motion and entire depth of wound visualized   Treatment:    Area cleansed with:  Shur-Clens and soap and water    Amount of cleaning:  Standard   Irrigation solution:  Sterile water    Irrigation volume:  50   Irrigation method:  Syringe Skin repair:    Repair method:  Sutures   Suture size:  5-0   Suture material:  Fast-absorbing gut   Suture technique:  Simple interrupted   Number of sutures:  3 Approximation:    Approximation:  Close Repair type:    Repair type:  Simple Post-procedure details:    Dressing:  Antibiotic ointment and non-adherent dressing   Procedure completion:  Tolerated well, no immediate complications     Medications Ordered in ED Medications  lidocaine -EPINEPHrine  (XYLOCAINE  W/EPI) 2 %-1:200000 (PF) injection (has no administration in time range)  lidocaine -prilocaine  (EMLA ) cream 1 Application (1 Application Topical Given 10/29/23 0305)    ED Course/ Medical Decision Making/ A&P                                 Medical Decision Making Amount and/or Complexity of Data Reviewed Radiology: ordered.  Risk Prescription drug management.   Mechanical fall without concussive type symptoms, ct head/face clear, knee with contusion but doubt fracture. Laceration repaired as above. Wound care discussed.   Final Clinical Impression(s) / ED  Diagnoses Final diagnoses:  Laceration of skin of chin, initial encounter    Rx / DC Orders ED Discharge Orders     None         Brynn Mulgrew, Reymundo Caulk, MD 10/29/23 [phone]

## 2023-10-30 ENCOUNTER — Inpatient Hospital Stay

## 2023-10-30 ENCOUNTER — Telehealth: Payer: Self-pay

## 2023-10-30 VITALS — BP 133/74 | HR 70 | Temp 98.3°F | Resp 16

## 2023-10-30 DIAGNOSIS — C259 Malignant neoplasm of pancreas, unspecified: Secondary | ICD-10-CM

## 2023-10-30 DIAGNOSIS — Z5111 Encounter for antineoplastic chemotherapy: Secondary | ICD-10-CM | POA: Diagnosis not present

## 2023-10-30 MED ORDER — HEPARIN SOD (PORK) LOCK FLUSH 100 UNIT/ML IV SOLN
500.0000 [IU] | Freq: Once | INTRAVENOUS | Status: AC | PRN
  Administered 2023-10-30: 500 [IU]

## 2023-10-30 MED ORDER — PEGFILGRASTIM-CBQV 6 MG/0.6ML ~~LOC~~ SOSY
6.0000 mg | PREFILLED_SYRINGE | Freq: Once | SUBCUTANEOUS | Status: AC
  Administered 2023-10-30: 6 mg via SUBCUTANEOUS
  Filled 2023-10-30: qty 0.6

## 2023-10-30 MED ORDER — SODIUM CHLORIDE 0.9% FLUSH
10.0000 mL | INTRAVENOUS | Status: DC | PRN
Start: 2023-10-30 — End: 2023-10-30
  Administered 2023-10-30: 10 mL

## 2023-10-30 NOTE — Telephone Encounter (Signed)
 Received Secure Chat message from [place], LPN in [facility] Flush room stating that pt wanted to speak with Dr. Candise Chambers nurse.  Spoke w/pt and she stated she fell yesterday night while going to the bathroom.  Pt and daughter stated that the pt went to the ED for further evaluation after the fall.  Pt had stitches in her chin and her mandible is bruised & swollen from the fall. Pt did not loosen or knock out any teeth.  Pt's home health nurse advised pt to f/u with Dr. Maryalice Smaller since the pt is currently receiving chemotherapy.  Stated this nurse will make Dr. Maryalice Smaller aware.  Pt stated she's still able to eat but did c/o of dizziness.  Pt's daughter stated the pt isn't really drinking much d/t pt's c/o food & water  not tasting right.  Instructed pt to drink Liquid IV, Gatorade, or Pedialyte to stay hydrated.  Also, instructed pt to supplement meals with Ensure or Boost since she's not really eating.  Pt and daughter verbalized understanding.  Instructed pt to contact Dr. Candise Chambers office should her chin start to show signs of infection or doesn't seem to be healing.  Pt and daughter verbalized understanding and stated they will contact Dr. Candise Chambers office with any changes.  Notified Dr. Maryalice Smaller and staff of this conversation with pt.

## 2023-10-30 NOTE — ED Provider Notes (Signed)
 [place] EMERGENCY DEPARTMENT AT [facility] Provider Note   CSN: [id number] Arrival date & time: 10/29/23  1707     History  Chief Complaint  Patient presents with   Wound Check    Bettey Palmatier is a 78 y.o. female.  HPI      78yo female with history of adenocarcinoma, CHF, hyperlipidemia, hypertension, type II DM per chart (she denies states due to pancreatic cancer), who fell yesterday and came to the ED with head/face CT without acute abnormalities, laceration repaired who presents with concern for bleeding from the area, wound dehiscence.  Has had some bleeding, saw little thread to the side and worried suture came out. Denies other acute concerns at this time.  Put some pressure on it, not sure if she needed more sutures placed.   Past Medical History:  Diagnosis Date   Allergy    Ambulates with cane    straight cane   Arthritis    knee, back   CHF (congestive heart failure) (HCC)    Ductal carcinoma in situ of breast 12/2018   R Breast-mastectomy only   Gout    History of blood transfusion 1986   w/ Hysterectomy surgery   Hyperlipidemia    diet controlled - no meds   Hypertension    Hypothyroidism    Pelvic kidney    lower right pelvic kidney    SBO (small bowel obstruction) (HCC) 10/2016   surgery    Sleep apnea    Mild - no mask needed per sleep study   Smoker    quit smoking 2018   Type 2 diabetes mellitus (HCC)     Home Medications Prior to Admission medications   Medication Sig Start Date End Date Taking? Authorizing Provider  Cholecalciferol (VITAMIN D -3) 125 MCG (5000 UT) TABS Take 1 tablet by mouth daily.    [provider]  famotidine  (PEPCID ) 20 MG tablet Take 1 tablet (20 mg total) by mouth 2 (two) times daily. Patient taking differently: Take 20 mg by mouth daily. 05/15/23   Soldatova, Liuba, MD  fluticasone  (FLONASE ) 50 MCG/ACT nasal spray Place 2 sprays into both nostrils daily. 05/15/23   Soldatova, Liuba, MD   Glucose Blood (BLOOD GLUCOSE TEST STRIPS) STRP 1 each by In Vitro route in the morning, at noon, and at bedtime. May substitute to any manufacturer covered by patient's insurance. 07/22/23   [name], MD  insulin  aspart (NOVOLOG ) 100 UNIT/ML FlexPen Inject 0-6 Units into the skin 3 (three) times daily with meals. Check Blood Glucose (BG) and inject per scale: BG <150= 0 unit; BG 150-200= 1 unit; BG 201-250= 2 unit; BG 251-300= 3 unit; BG 301-350= 4 unit; BG 351-400= 5 unit; BG >400= 6 unit and Call Primary Care. 06/28/23   Rai, Hurman Maiden, MD  Insulin  Glargine (BASAGLAR  KWIKPEN) 100 UNIT/ML Inject 12 Units into the skin daily. May substitute as needed per insurance. Patient taking differently: Inject 12 Units into the skin at bedtime. 06/28/23   Rai, Hurman Maiden, MD  Insulin  Pen Needle (PEN NEEDLES) 31G X 5 MM MISC 1 each by Does not apply route 3 (three) times daily. May dispense any manufacturer covered by patient's insurance. 09/03/23   [name], MD  Lancets [facility] DELICA PLUS [place]) MISC 1 each by Does not apply route 3 (three) times daily. 09/01/23   [name], MD  lidocaine -prilocaine  (EMLA ) cream Apply 1 Application topically as needed. 07/23/23   Sonja Falcon, MD  losartan  (COZAAR )  100 MG tablet Take 1 tablet (100 mg total) by mouth daily. 10/21/23   [name], MD  NIFEdipine  (ADALAT  CC) 60 MG 24 hr tablet Take 1 tablet (60 mg total) by mouth daily. 10/21/23   [name], MD  ondansetron  (ZOFRAN ) 8 MG tablet Take 1 tablet (8 mg total) by mouth every 8 (eight) hours as needed for nausea or vomiting. 07/23/23   Sonja Pike, MD  potassium chloride  (KLOR-CON ) 10 MEQ tablet Take 1 tablet (10 mEq total) by mouth 3 (three) times daily for 7 days, THEN 1 tablet (10 mEq total) daily for 23 days. 09/30/23 10/30/23  Sonja [place], MD  prochlorperazine  (COMPAZINE ) 10 MG tablet Take 1 tablet (10 mg total) by mouth every 6 (six) hours as needed for nausea or vomiting. 07/23/23   Sonja , MD   thyroid  (NP THYROID ) 90 MG tablet Take 1 tablet (90 mg total) by mouth daily. 10/21/23   [name], MD      Allergies    Lisinopril , Oxaliplatin , Statins, and Atorvastatin     Review of Systems   Review of Systems  Physical Exam Updated Vital Signs BP 136/76   Pulse 72   Temp 98 F (36.7 C)   SpO2 100%  Physical Exam Vitals and nursing note reviewed.  Constitutional:      General: She is not in acute distress.    Appearance: Normal appearance. She is not ill-appearing, toxic-appearing or diaphoretic.  HENT:     Head: Normocephalic.     Comments: 1.5cm laceration to chin, 2 sutures in place at distal ends Eyes:     Conjunctiva/sclera: Conjunctivae normal.  Cardiovascular:     Rate and Rhythm: Normal rate and regular rhythm.     Pulses: Normal pulses.  Pulmonary:     Effort: Pulmonary effort is normal. No respiratory distress.  Musculoskeletal:        General: No deformity or signs of injury.     Cervical back: No rigidity.  Skin:    General: Skin is warm and dry.     Coloration: Skin is not jaundiced or pale.  Neurological:     General: No focal deficit present.     Mental Status: She is alert and oriented to person, place, and time.     ED Results / Procedures / Treatments   Labs (all labs ordered are listed, but only abnormal results are displayed) Labs Reviewed - No data to display  EKG None  Radiology CT Head Wo Contrast Result Date: 10/29/2023 CLINICAL DATA:  Blunt facial trauma. Fell this morning from standing. Chin hit knob on a dresser. Cm laceration to chin. Also hit head and left knee. EXAM: CT HEAD WITHOUT CONTRAST CT MAXILLOFACIAL WITHOUT CONTRAST TECHNIQUE: Multidetector CT imaging of the head and maxillofacial structures were performed using the standard protocol without intravenous contrast. Multiplanar CT image reconstructions of the maxillofacial structures were also generated. RADIATION DOSE REDUCTION: This exam was performed according to the  departmental dose-optimization program which includes automated exposure control, adjustment of the mA and/or kV according to patient size and/or use of iterative reconstruction technique. COMPARISON:  CT head 04/11/2008 FINDINGS: CT HEAD FINDINGS Brain: No evidence of acute infarction, hemorrhage, hydrocephalus, extra-axial collection or mass lesion/mass effect. Vascular: No hyperdense vessel or unexpected calcification. Skull: Normal. Negative for fracture or focal lesion. Other: None. CT MAXILLOFACIAL FINDINGS Osseous: No acute fracture or mandibular dislocation. Orbits: Negative. No traumatic or inflammatory finding. Sinuses: Clear. Soft tissues: Laceration/contusion about the chin with gas  in the soft tissues and musculature about the left mandible. IMPRESSION: 1. No acute intracranial abnormality. 2. No acute facial fracture. Laceration/contusion about the chin. Electronically Signed   By: Rozell Cornet M.D.   On: 10/29/2023 03:40   CT Maxillofacial Wo Contrast Result Date: 10/29/2023 CLINICAL DATA:  Blunt facial trauma. Fell this morning from standing. Chin hit knob on a dresser. Cm laceration to chin. Also hit head and left knee. EXAM: CT HEAD WITHOUT CONTRAST CT MAXILLOFACIAL WITHOUT CONTRAST TECHNIQUE: Multidetector CT imaging of the head and maxillofacial structures were performed using the standard protocol without intravenous contrast. Multiplanar CT image reconstructions of the maxillofacial structures were also generated. RADIATION DOSE REDUCTION: This exam was performed according to the departmental dose-optimization program which includes automated exposure control, adjustment of the mA and/or kV according to patient size and/or use of iterative reconstruction technique. COMPARISON:  CT head 04/11/2008 FINDINGS: CT HEAD FINDINGS Brain: No evidence of acute infarction, hemorrhage, hydrocephalus, extra-axial collection or mass lesion/mass effect. Vascular: No hyperdense vessel or unexpected  calcification. Skull: Normal. Negative for fracture or focal lesion. Other: None. CT MAXILLOFACIAL FINDINGS Osseous: No acute fracture or mandibular dislocation. Orbits: Negative. No traumatic or inflammatory finding. Sinuses: Clear. Soft tissues: Laceration/contusion about the chin with gas in the soft tissues and musculature about the left mandible. IMPRESSION: 1. No acute intracranial abnormality. 2. No acute facial fracture. Laceration/contusion about the chin. Electronically Signed   By: Rozell Cornet M.D.   On: 10/29/2023 03:40    Procedures Procedures    Medications Ordered in ED Medications - No data to display  ED Course/ Medical Decision Making/ A&P                                 Medical Decision Making  [phone] female with history of adenocarcinoma, CHF, hyperlipidemia, hypertension, type II DM per chart (she denies states due to pancreatic cancer), who fell yesterday and came to the ED with head/face CT without acute abnormalities, laceration repaired who presents with concern for bleeding from the area, wound dehiscence since it was sutured last night.  Wound is overall hemostatic, minor bleeding with aggravation. Discussed placing constant pressure if there is some bleeding from the wound Not having significant hemorrhage to suggest need for aggressive intervention.  Has not had INR since Jan-can be checked as outpt, platelets WNL yesterday.  There was a suture thread to the side , given notes say 3 sutures present believe this was extra suture material, also while examining the area a suture was noted to be untied and was removed.  Has 2 remaining sutures at distal ends which are intact and the wound continues to appear well approximated. (See photo) Did clean area with chloraprep, placed 5 steri strips for support. Discussed monitoring for signs of infection.Patient discharged in stable condition with understanding of reasons to return.         Final Clinical Impression(s) /  ED Diagnoses Final diagnoses:  Wound dehiscence  Bleeding    Rx / DC Orders ED Discharge Orders     None         Scarlette Currier, MD 10/31/23 1243

## 2023-10-31 ENCOUNTER — Telehealth: Payer: Self-pay

## 2023-10-31 NOTE — Telephone Encounter (Signed)
 Spoke with pt via telephone regarding previous fall.  Pt stated she does NOT want to postpone her chemo tx.  She said she feels fine and her jaw is bruised but is healing OK.  Pt stated she will notify Dr. Candise Chambers office if she starts to develop signs of infection.  Pt had no further questions or concerns

## 2023-11-03 DIAGNOSIS — C259 Malignant neoplasm of pancreas, unspecified: Secondary | ICD-10-CM | POA: Diagnosis not present

## 2023-11-05 ENCOUNTER — Ambulatory Visit: Payer: Self-pay

## 2023-11-05 ENCOUNTER — Ambulatory Visit (HOSPITAL_COMMUNITY)
Admission: RE | Admit: 2023-11-05 | Discharge: 2023-11-05 | Disposition: A | Discharge status: Home / Self Care | Source: Ambulatory Visit | Attending: Nurse Practitioner | Admitting: Nurse Practitioner

## 2023-11-05 DIAGNOSIS — C259 Malignant neoplasm of pancreas, unspecified: Secondary | ICD-10-CM | POA: Insufficient documentation

## 2023-11-05 DIAGNOSIS — R160 Hepatomegaly, not elsewhere classified: Secondary | ICD-10-CM | POA: Diagnosis not present

## 2023-11-05 DIAGNOSIS — K8689 Other specified diseases of pancreas: Secondary | ICD-10-CM | POA: Diagnosis not present

## 2023-11-05 DIAGNOSIS — Q632 Ectopic kidney: Secondary | ICD-10-CM | POA: Diagnosis not present

## 2023-11-05 MED ORDER — IOHEXOL 300 MG/ML  SOLN
100.0000 mL | Freq: Once | INTRAMUSCULAR | Status: AC | PRN
  Administered 2023-11-05: 100 mL via INTRAVENOUS

## 2023-11-10 MED FILL — Fosaprepitant Dimeglumine For IV Infusion 150 MG (Base Eq): INTRAVENOUS | Qty: 5 | Status: AC

## 2023-11-11 ENCOUNTER — Inpatient Hospital Stay

## 2023-11-11 ENCOUNTER — Inpatient Hospital Stay: Admitting: Hematology

## 2023-11-11 ENCOUNTER — Other Ambulatory Visit: Payer: Self-pay

## 2023-11-11 ENCOUNTER — Inpatient Hospital Stay: Admitting: Nutrition

## 2023-11-11 ENCOUNTER — Encounter: Payer: Self-pay | Admitting: Hematology

## 2023-11-11 VITALS — BP 144/78 | HR 93 | Temp 97.6°F | Resp 18 | Ht 64.0 in | Wt 154.9 lb

## 2023-11-11 DIAGNOSIS — D649 Anemia, unspecified: Secondary | ICD-10-CM

## 2023-11-11 DIAGNOSIS — D6481 Anemia due to antineoplastic chemotherapy: Secondary | ICD-10-CM

## 2023-11-11 DIAGNOSIS — D0511 Intraductal carcinoma in situ of right breast: Secondary | ICD-10-CM

## 2023-11-11 DIAGNOSIS — K8689 Other specified diseases of pancreas: Secondary | ICD-10-CM

## 2023-11-11 DIAGNOSIS — Z95828 Presence of other vascular implants and grafts: Secondary | ICD-10-CM

## 2023-11-11 DIAGNOSIS — C250 Malignant neoplasm of head of pancreas: Secondary | ICD-10-CM

## 2023-11-11 DIAGNOSIS — C259 Malignant neoplasm of pancreas, unspecified: Secondary | ICD-10-CM

## 2023-11-11 DIAGNOSIS — T451X5A Adverse effect of antineoplastic and immunosuppressive drugs, initial encounter: Secondary | ICD-10-CM

## 2023-11-11 DIAGNOSIS — Z5111 Encounter for antineoplastic chemotherapy: Secondary | ICD-10-CM | POA: Diagnosis not present

## 2023-11-11 LAB — CMP (CANCER CENTER ONLY)
ALT: 26 U/L (ref 0–44)
AST: 25 U/L (ref 15–41)
Albumin: 3.5 g/dL (ref 3.5–5.0)
Alkaline Phosphatase: 245 U/L — ABNORMAL HIGH (ref 38–126)
Anion gap: 9 (ref 5–15)
BUN: 11 mg/dL (ref 8–23)
CO2: 24 mmol/L (ref 22–32)
Calcium: 7.3 mg/dL — ABNORMAL LOW (ref 8.9–10.3)
Chloride: 108 mmol/L (ref 98–111)
Creatinine: 0.97 mg/dL (ref 0.44–1.00)
GFR, Estimated: >60 mL/min (ref >60)
Glucose, Bld: 120 mg/dL — ABNORMAL HIGH (ref 70–99)
Potassium: 3.4 mmol/L — ABNORMAL LOW (ref 3.5–5.1)
Sodium: 141 mmol/L (ref 135–145)
Total Bilirubin: 0.3 mg/dL (ref 0.0–1.2)
Total Protein: 6.1 g/dL — ABNORMAL LOW (ref 6.5–8.1)

## 2023-11-11 LAB — CBC WITH DIFFERENTIAL (CANCER CENTER ONLY)
Abs Immature Granulocytes: 1.23 10*3/uL — ABNORMAL HIGH (ref 0.00–0.07)
Basophils Absolute: 0.1 10*3/uL (ref 0.0–0.1)
Basophils Relative: 0 %
Eosinophils Absolute: 0.1 10*3/uL (ref 0.0–0.5)
Eosinophils Relative: 0 %
HCT: 22.6 % — ABNORMAL LOW (ref 36.0–46.0)
Hemoglobin: 7.6 g/dL — ABNORMAL LOW (ref 12.0–15.0)
Immature Granulocytes: 5 %
Lymphocytes Relative: 15 %
Lymphs Abs: 3.4 10*3/uL (ref 0.7–4.0)
MCH: 32.5 pg (ref 26.0–34.0)
MCHC: 33.6 g/dL (ref 30.0–36.0)
MCV: 96.6 fL (ref 80.0–100.0)
Monocytes Absolute: 1.3 10*3/uL — ABNORMAL HIGH (ref 0.1–1.0)
Monocytes Relative: 6 %
Neutro Abs: 16.8 10*3/uL — ABNORMAL HIGH (ref 1.7–7.7)
Neutrophils Relative %: 74 %
Platelet Count: 182 10*3/uL (ref 150–400)
RBC: 2.34 MIL/uL — ABNORMAL LOW (ref 3.87–5.11)
RDW: 20.4 % — ABNORMAL HIGH (ref 11.5–15.5)
WBC Count: 22.9 10*3/uL — ABNORMAL HIGH (ref 4.0–10.5)
nRBC: 0.7 % — ABNORMAL HIGH (ref 0.0–0.2)

## 2023-11-11 LAB — SAMPLE TO BLOOD BANK

## 2023-11-11 LAB — PREPARE RBC (CROSSMATCH)

## 2023-11-11 MED ORDER — SODIUM CHLORIDE 0.9 % IV SOLN
2400.0000 mg/m2 | INTRAVENOUS | Status: DC
  Administered 2023-11-11: 4350 mg via INTRAVENOUS
  Filled 2023-11-11: qty 87

## 2023-11-11 MED ORDER — PALONOSETRON HCL INJECTION 0.25 MG/5ML
0.2500 mg | Freq: Once | INTRAVENOUS | Status: AC
  Administered 2023-11-11: 0.25 mg via INTRAVENOUS
  Filled 2023-11-11: qty 5

## 2023-11-11 MED ORDER — OXALIPLATIN CHEMO INJECTION 100 MG/20ML
85.0000 mg/m2 | Freq: Once | INTRAVENOUS | Status: AC
  Administered 2023-11-11: 150 mg via INTRAVENOUS
  Filled 2023-11-11: qty 30

## 2023-11-11 MED ORDER — DEXAMETHASONE SODIUM PHOSPHATE 10 MG/ML IJ SOLN
10.0000 mg | Freq: Once | INTRAMUSCULAR | Status: AC
  Administered 2023-11-11: 10 mg via INTRAVENOUS
  Filled 2023-11-11: qty 1

## 2023-11-11 MED ORDER — SODIUM CHLORIDE 0.9 % IV SOLN
260.0000 mg | Freq: Once | INTRAVENOUS | Status: AC
  Administered 2023-11-11: 260 mg via INTRAVENOUS
  Filled 2023-11-11: qty 13

## 2023-11-11 MED ORDER — FAMOTIDINE IN NACL 20-0.9 MG/50ML-% IV SOLN
20.0000 mg | Freq: Once | INTRAVENOUS | Status: AC
  Administered 2023-11-11: 20 mg via INTRAVENOUS
  Filled 2023-11-11: qty 50

## 2023-11-11 MED ORDER — SODIUM CHLORIDE 0.9% FLUSH
10.0000 mL | Freq: Once | INTRAVENOUS | Status: AC
Start: 2023-11-11 — End: 2023-11-11
  Administered 2023-11-11: 10 mL

## 2023-11-11 MED ORDER — DEXTROSE 5 % IV SOLN: INTRAVENOUS | Status: DC

## 2023-11-11 MED ORDER — SODIUM CHLORIDE 0.9 % IV SOLN
150.0000 mg | Freq: Once | INTRAVENOUS | Status: AC
  Administered 2023-11-11: 150 mg via INTRAVENOUS
  Filled 2023-11-11: qty 150

## 2023-11-11 MED ORDER — ATROPINE SULFATE 1 MG/ML IV SOLN
0.5000 mg | Freq: Once | INTRAVENOUS | Status: AC | PRN
  Administered 2023-11-11: 0.5 mg via INTRAVENOUS
  Filled 2023-11-11: qty 1

## 2023-11-11 MED ORDER — DIPHENHYDRAMINE HCL 50 MG/ML IJ SOLN
50.0000 mg | Freq: Once | INTRAMUSCULAR | Status: AC
  Administered 2023-11-11: 50 mg via INTRAVENOUS
  Filled 2023-11-11: qty 1

## 2023-11-11 MED ORDER — SODIUM CHLORIDE 0.9 % IV SOLN
400.0000 mg/m2 | Freq: Once | INTRAVENOUS | Status: AC
  Administered 2023-11-11: 728 mg via INTRAVENOUS
  Filled 2023-11-11: qty 25

## 2023-11-11 NOTE — Assessment & Plan Note (Addendum)
 cT2N0M0, stage IB -Patient presented with obstructive jaundice, CT and MRI showed a 2.5 cm mass in the head of the pancreas, with pancreatic duct and biliary duct dilatation.  ERCP was attempted twice but failed due to deformed duodenum -She underwent ERCP and stent placement, EUS with fine-needle biopsy of the pancreatic mass by Dr. Brice Campi on July 08, 2023.  Biopsy confirmed adenocarcinoma. -Due to the tumor invasion to portal vein and SMV, I recommended neoadjuvant chemotherapy FOLFIRINOX, she started on 08/05/2023 -restaging CT 11/05/2023 showed stable disease

## 2023-11-11 NOTE — Progress Notes (Signed)
 [facility]   Telephone:([phone] Fax:(336) [phone]   Clinic Follow up Note   Patient Care Team: [name], MD as PCP - General (Internal Medicine) Eilleen Grates, MD as PCP - Cardiology (Cardiology) Alane Hsu, RN as Oncology Nurse Navigator Auther Bo, RN as Oncology Nurse Navigator Caralyn Chandler, MD as Consulting Physician (General Surgery) Sonja Cherokee, MD as Consulting Physician (Hematology) Colie Dawes, MD as Attending Physician (Radiation Oncology) Myrle Aspen, [facility] (Inactive) as Pharmacist (Pharmacist)  Date of Service:  11/11/2023  CHIEF COMPLAINT: f/u of pancreatic cancer  CURRENT THERAPY:  Neoadjuvant chemotherapy FOLFIRINOX  Oncology History   Pancreatic adenocarcinoma (HCC) cT2N0M0, stage IB -Patient presented with obstructive jaundice, CT and MRI showed a 2.5 cm mass in the head of the pancreas, with pancreatic duct and biliary duct dilatation.  ERCP was attempted twice but failed due to deformed duodenum -She underwent ERCP and stent placement, EUS with fine-needle biopsy of the pancreatic mass by Dr. Brice Campi on July 08, 2023.  Biopsy confirmed adenocarcinoma. -Due to the tumor invasion to portal vein and SMV, I recommended neoadjuvant chemotherapy FOLFIRINOX, she started on 08/05/2023 -restaging CT 11/05/2023 showed stable disease   Assessment & Plan Pancreatic cancer Pancreatic cancer is well-managed with no progression or metastasis as per recent CT scan. Tumor size is unchanged and continues to invade surrounding blood vessels, complicating surgical intervention. Previous consultation with Dr. Karleen Overall indicated surgery would be challenging. The goal is disease control rather than cure, given the high risk of recurrence post-surgery (50-70%). - Continue current chemotherapy regimen - Consult with Dr. Karleen Overall regarding surgical options - Discuss case in tumor board with Dr. Leighton Punches and radiation oncologist -  Consider changing chemotherapy regimen to gemcitabine and Abraxane if surgery is not offered at this point - Consider consolidation radiation if surgery is not feasible - Proceed with chemotherapy today  Chemotherapy-induced anemia Hemoglobin level is 7.6, decreased from 8.2, likely due to chemotherapy. Blood transfusion is considered safe with tested blood for HIV and hepatitis. Prenatal vitamins may provide supportive care but are unlikely to significantly improve anemia. - Schedule blood transfusion within the next week - Perform type and crossmatch for blood transfusion - Consider prenatal vitamins for supportive care  Chemotherapy-induced gastrointestinal inflammation Intermittent lower abdominal discomfort, likely due to chemotherapy-induced gastrointestinal inflammation. No heartburn or cramping reported. Loose stools present but not progressing to diarrhea. - Use Tylenol  for discomfort - Consider over-the-counter Pepcid , omeprazole, or Tums for acid reflux symptoms  Worsening anemia - Secondary to chemotherapy - Hemoglobin 7.8, she is mildly symptomatic. - Will consider blood transfusion, consent obtained after long discussion.  Plan - Lab reviewed, adequate for treatment, will proceed cycle 8 FOLFIRINOX today at the same dose - 1 unit blood transfusion later this week or next week - Will review her case in tumor board, to see if surgery is feasible (unlikely).  If not, I will change her chemotherapy to gemcitabine and Abraxane, starting in 2 weeks - Follow-up in 2 weeks    SUMMARY OF ONCOLOGIC HISTORY: Oncology History  Ductal carcinoma in situ (DCIS) of right breast  01/28/2019 Mammogram   Diagnostic Mammogram 01/28/19  IMPRESSION: Right breast retroareolar 7 mm grouped pleomorphic calcifications are suspicious.   02/02/2019 Initial Biopsy   Diagnosis 02/02/19 Breast, right, needle core biopsy, outer retroareolar - DUCTAL CARCINOMA IN SITU WITH CALCIFICATIONS. Microscopic  Comment The ductal carcinoma in situ is intermediate grade. Estrogen and progesterone receptors will be performed.  Results:  IMMUNOHISTOCHEMICAL AND MORPHOMETRIC ANALYSIS PERFORMED MANUALLY Estrogen Receptor: 90%, POSITIVE, STRONG STAINING INTENSITY Progesterone Receptor: 20%, POSITIVE, STRONG STAINING INTENSITY    02/02/2019 Cancer Staging   Staging form: Breast, AJCC 8th Edition - Clinical stage from 02/02/2019: Stage 0 (cTis (DCIS), cN0, cM0, G2, ER+, PR+, HER2: Not Assessed) - Signed by Sonja Jolivue, MD on 02/10/2019   02/04/2019 Initial Diagnosis   Ductal carcinoma in situ (DCIS) of right breast   03/18/2019 Surgery   RIGHT BREAST LUMPECTOMY WITH RADIOACTIVE SEED LOCALIZATION by Dr. Alethea Andes 03/18/19   03/18/2019 Pathology Results   FINAL MICROSCOPIC DIAGNOSIS: 03/18/19 A. BREAST, RIGHT, LUMPECTOMY:  - Ductal carcinoma in situ with calcifications and necrosis, 1.4 cm.  - Ductal carcinoma in situ focally 0.1 cm from medial and anterior  lumpectomy margins.  - Ductal carcinoma in situ involves the final anterior margin (See part  D).  - Ductal carcinoma in situ focally 0.3 cm from superior lumpectomy  margin.  - Biopsy site and biopsy clip.   B. BREAST, RIGHT LATERAL MARGIN, EXCISION:  - Benign breast tissue.  - No ductal carcinoma in situ.  - Final lateral margin greater than 1 cm.   C. BREAST, RIGHT MEDIAL MARGIN, EXCISION:  - Ductal carcinoma in situ, 0.3 cm.  - Ductal carcinoma in situ with focally 0.3 cm from final medial margin.   D. BREAST, RIGHT ANTERIOR MARGIN, EXCISION:  - Ductal carcinoma in situ, 1.5 cm.  - Ductal carcinoma in situ focally involves final anterior margin.   E. BREAST, RIGHT DEEP MARGIN, EXCISION:  - Fibrocystic changes.  - No ductal carcinoma in situ.  - Final posterior margin greater than 0.7 cm.    04/08/2019 Surgery   RE-EXCISION OF RIGHT BREAST ANTERIOR MARGINS by Dr Alethea Andes 04/08/19   04/08/2019 Pathology Results    DIAGNOSIS: 04/08/19  A.  BREAST, RIGHT, ANTERIOR SUPERIOR, EXCISION:  - Intermediate grade ductal carcinoma in situ with necrosis.  - In situ carcinoma is <1 mm (not on ink) from the new anterior superior  margin, multifocally.  - Intraductal papilloma.  - Fibrocystic change.   B. BREAST, RIGHT, ANTERIOR INFERIOR, EXCISION:  - Benign breast tissue with resection changes.   C. BREAST, RIGHT, MEDIAL, EXCISION:  - Benign breast tissue with resection changes.  - Fibroadenomatoid and fibrocystic changes.   05/06/2019 Mammogram   Right Mammogram 05/06/19 IMPRESSION: Suspicious finding with 2 groups of microcalcifications in the medial right breast. The anterior group measures 2 x 2 x 2 mm. The more posterior group measures 4 x 3 x 4 mm.   05/12/2019 Pathology Results   Diagnosis 05/12/19 1. Breast, right, needle core biopsy, medial - DUCTAL CARCINOMA IN SITU WITH CALCIFICATIONS. - SEE MICROSCOPIC DESCRIPTION. 2. Breast, right, needle core biopsy, medial - DUCTAL CARCINOMA IN SITU WITH CALCIFICATIONS. - SEE MICROSCOPIC DESCRIPTION. Results: IMMUNOHISTOCHEMICAL AND MORPHOMETRIC ANALYSIS PERFORMED MANUALLY Estrogen Receptor: 100%, POSITIVE, STRONG STAINING INTENSITY Progesterone Receptor: 0%, NEGATIVE COMMENT: The negative hormone receptor study(ies) in this case has an internal positive control. Results: IMMUNOHISTOCHEMICAL AND MORPHOMETRIC ANALYSIS PERFORMED MANUALLY Estrogen Receptor: 100%, POSITIVE, STRONG STAINING INTENSITY Progesterone Receptor: 0%, NEGATIVE COMMENT: The negative hormone receptor study(ies) in this case has an internal positive control.   07/26/2019 Surgery   RIGHT MASTECTOMY WITH SENTINEL NODE BIOPSY by Dr. Alethea Andes 07/26/19   07/26/2019 Pathology Results   FINAL MICROSCOPIC DIAGNOSIS: 07/26/19  A. LYMPH NODE, RIGHT #1, SENTINEL,  BIOPSY:  -  No carcinoma identified in one lymph node (0/1)   B. BREAST, RIGHT, MASTECTOMY:  -  Ductal carcinoma in situ, intermediate grade, 0.6 cm  -   Margins uninvolved by carcinoma (greater than 1 cm; posterior margin)  -  Previous biopsy site changes (x2)  -  See oncology table below   07/26/2019 Cancer Staging   Staging form: Breast, AJCC 8th Edition - Pathologic stage from 07/26/2019: Stage 0 (pTis (DCIS), pN0, cM0, G2, ER+, PR+, HER2: Not Assessed) - Signed by Sonja [place], MD on 08/10/2019   08/15/2023 Genetic Testing   Negative [facility] CancerNext-Expanded +RNAinsight Panel.  VUS in ATM at c.4910-891G>A and SDHA at c.-1C>G.  Report date is 08/15/2023.   The CancerNext-Expanded gene panel offered by [facility] and includes sequencing, rearrangement, and RNA analysis for the following 76 genes: AIP, ALK, APC, ATM, AXIN2, BAP1, BARD1, BMPR1A, BRCA1, BRCA2, BRIP1, CDC73, CDH1, CDK4, CDKN1B, CDKN2A, CEBPA, CHEK2, CTNNA1, DDX41, DICER1, ETV6, FH, FLCN, GATA2, LZTR1, MAX, MBD4, MEN1, MET, MLH1, MSH2, MSH3, MSH6, MUTYH, NF1, NF2, NTHL1, PALB2, PHOX2B, PMS2, POT1, PRKAR1A, PTCH1, PTEN, RAD51C, RAD51D, RB1, RET, RUNX1, SDHA, SDHAF2, SDHB, SDHC, SDHD, SMAD4, SMARCA4, SMARCB1, SMARCE1, STK11, SUFU, TMEM127, TP53, TSC1, TSC2, VHL, and WT1 (sequencing and deletion/duplication); EGFR, HOXB13, KIT, MITF, PDGFRA, POLD1, and POLE (sequencing only); EPCAM and GREM1 (deletion/duplication only).    Pancreatic adenocarcinoma (HCC)  07/07/2022 Cancer Staging   Staging form: Exocrine Pancreas, AJCC 8th Edition - Clinical stage from 07/07/2022: Stage IB (cT2, cN0, cM0) - Signed by Sonja Iola, MD on 07/22/2023 Stage prefix: Initial diagnosis Total positive nodes: 0   06/13/2023 Initial Diagnosis   Pancreatic adenocarcinoma (HCC)   08/06/2023 -  Chemotherapy   Patient is on Treatment Plan : PANCREAS Modified FOLFIRINOX q14d x 4 cycles     08/15/2023 Genetic Testing   Negative [facility] CancerNext-Expanded +RNAinsight Panel.  VUS in ATM at c.4910-891G>A and SDHA at c.-1C>G.  Report date is 08/15/2023.   The CancerNext-Expanded gene panel offered by [facility] and  includes sequencing, rearrangement, and RNA analysis for the following 76 genes: AIP, ALK, APC, ATM, AXIN2, BAP1, BARD1, BMPR1A, BRCA1, BRCA2, BRIP1, CDC73, CDH1, CDK4, CDKN1B, CDKN2A, CEBPA, CHEK2, CTNNA1, DDX41, DICER1, ETV6, FH, FLCN, GATA2, LZTR1, MAX, MBD4, MEN1, MET, MLH1, MSH2, MSH3, MSH6, MUTYH, NF1, NF2, NTHL1, PALB2, PHOX2B, PMS2, POT1, PRKAR1A, PTCH1, PTEN, RAD51C, RAD51D, RB1, RET, RUNX1, SDHA, SDHAF2, SDHB, SDHC, SDHD, SMAD4, SMARCA4, SMARCB1, SMARCE1, STK11, SUFU, TMEM127, TP53, TSC1, TSC2, VHL, and WT1 (sequencing and deletion/duplication); EGFR, HOXB13, KIT, MITF, PDGFRA, POLD1, and POLE (sequencing only); EPCAM and GREM1 (deletion/duplication only).       Discussed the use of AI scribe software for clinical note transcription with the patient, who gave verbal consent to proceed.  History of Present Illness Adrienne [name] is a 78 year old female with pancreatic cancer who presents for follow-up.  She is undergoing chemotherapy without significant issues from the last cycle. She experiences no nausea, illness, or adverse reactions. Her weight is stable at 154 pounds with good nutritional intake.  On Friday, she experienced intermittent lower abdominal discomfort described as a 'twinge' with loose stools. She used Pepcid  for relief, but discomfort persists occasionally. Her hemoglobin level decreased to 7.6 from 8.2, with fatigue remaining unchanged. She is taking insulin  and uses Pepcid  for stomach issues.     All other systems were reviewed with the patient and are negative.  MEDICAL HISTORY:  Past Medical History:  Diagnosis Date   Allergy    Ambulates with cane    straight cane   Arthritis    knee, back   CHF (congestive  heart failure) (HCC)    Ductal carcinoma in situ of breast 12/2018   R Breast-mastectomy only   Gout    History of blood transfusion 1986   w/ Hysterectomy surgery   Hyperlipidemia    diet controlled - no meds   Hypertension     Hypothyroidism    Pelvic kidney    lower right pelvic kidney    SBO (small bowel obstruction) (HCC) 10/2016   surgery    Sleep apnea    Mild - no mask needed per sleep study   Smoker    quit smoking 2018   Type 2 diabetes mellitus (HCC)     SURGICAL HISTORY: Past Surgical History:  Procedure Laterality Date   ABDOMINAL HYSTERECTOMY  1986   COMPLETE-precancerous   APPENDECTOMY     pt states it was removed when gallbladder was removed.   AUGMENTATION MAMMAPLASTY     BACK SURGERY     lower back   BILIARY BRUSHING  07/08/2023   Procedure: BILIARY BRUSHING;  Surgeon: Brice Campi Albino Alu., MD;  Location: [facility];  Service: Gastroenterology;;   BILIARY STENT PLACEMENT N/A 07/08/2023   Procedure: BILIARY STENT PLACEMENT;  Surgeon: Normie Becton., MD;  Location: [facility];  Service: Gastroenterology;  Laterality: N/A;   BIOPSY  06/14/2023   Procedure: BIOPSY;  Surgeon: Kenney Peacemaker, MD;  Location: [facility];  Service: Gastroenterology;;   BIOPSY  07/08/2023   Procedure: BIOPSY;  Surgeon: Normie Becton., MD;  Location: [facility];  Service: Gastroenterology;;   BREAST BIOPSY Right 05/12/2019   times 2   BREAST LUMPECTOMY WITH RADIOACTIVE SEED LOCALIZATION Right 03/18/2019   Procedure: RIGHT BREAST LUMPECTOMY WITH RADIOACTIVE SEED LOCALIZATION;  Surgeon: Caralyn Chandler, MD;  Location: [facility];  Service: General;  Laterality: Right;   CHOLECYSTECTOMY     COLONOSCOPY  02/15/2008   [facility]    COLONOSCOPY WITH PROPOFOL   02/27/2018   Dr.Nandigam   ECTOPIC PREGNANCY SURGERY     ERCP N/A 06/14/2023   Procedure: ENDOSCOPIC RETROGRADE CHOLANGIOPANCREATOGRAPHY (ERCP);  Surgeon: Kenney Peacemaker, MD;  Location: [facility];  Service: Gastroenterology;  Laterality: N/A;   ERCP N/A 06/27/2023   Procedure: ENDOSCOPIC RETROGRADE CHOLANGIOPANCREATOGRAPHY (ERCP);  Surgeon: Lajuan Pila, MD;  Location: [facility];  Service: Gastroenterology;  Laterality: N/A;   ERCP N/A  07/08/2023   Procedure: ENDOSCOPIC RETROGRADE CHOLANGIOPANCREATOGRAPHY (ERCP);  Surgeon: Normie Becton., MD;  Location: [facility];  Service: Gastroenterology;  Laterality: N/A;   ESOPHAGOGASTRODUODENOSCOPY N/A 07/08/2023   Procedure: ESOPHAGOGASTRODUODENOSCOPY (EGD);  Surgeon: Normie Becton., MD;  Location: [facility];  Service: Gastroenterology;  Laterality: N/A;   ESOPHAGOGASTRODUODENOSCOPY (EGD) WITH PROPOFOL  N/A 06/14/2023   Procedure: ESOPHAGOGASTRODUODENOSCOPY (EGD) WITH PROPOFOL ;  Surgeon: Kenney Peacemaker, MD;  Location: [facility];  Service: Gastroenterology;  Laterality: N/A;   EUS N/A 07/08/2023   Procedure: UPPER ENDOSCOPIC ULTRASOUND (EUS) LINEAR;  Surgeon: Normie Becton., MD;  Location: [facility];  Service: Gastroenterology;  Laterality: N/A;   FINE NEEDLE ASPIRATION N/A 07/08/2023   Procedure: FINE NEEDLE ASPIRATION (FNA) LINEAR;  Surgeon: Normie Becton., MD;  Location: [facility];  Service: Gastroenterology;  Laterality: N/A;   IR IMAGING GUIDED PORT INSERTION  07/29/2023   JOINT REPLACEMENT     Left hip total Dr. France Ina 10-01-17   MALONEY DILATION  06/14/2023   Procedure: Londa Rival DILATION;  Surgeon: Kenney Peacemaker, MD;  Location: [facility];  Service: Gastroenterology;;   MASTECTOMY Right 07/26/2019   PANCREATIC STENT PLACEMENT  06/14/2023   Procedure:  PANCREATIC STENT PLACEMENT;  Surgeon: Kenney Peacemaker, MD;  Location: [facility];  Service: Gastroenterology;;   PANCREATIC STENT PLACEMENT  06/27/2023   Procedure: PANCREATIC STENT PLACEMENT;  Surgeon: Lajuan Pila, MD;  Location: [facility];  Service: Gastroenterology;;   POLYPECTOMY     polypectomy-oropharynx     RE-EXCISION OF BREAST CANCER,SUPERIOR MARGINS Right 04/08/2019   Procedure: RE-EXCISION OF RIGHT BREAST ANTERIOR MARGINS;  Surgeon: Lillette Reid III, MD;  Location: [facility];  Service: General;  Laterality: Right;   right knee meniscus     SBO  10/2016   small bowel  obstruction   SIMPLE MASTECTOMY WITH AXILLARY SENTINEL NODE BIOPSY Right 07/26/2019   Procedure: RIGHT MASTECTOMY WITH SENTINEL NODE BIOPSY;  Surgeon: Caralyn Chandler, MD;  Location: [facility];  Service: General;  Laterality: Right;   SPHINCTEROTOMY  06/27/2023   Procedure: SPHINCTEROTOMY;  Surgeon: Lajuan Pila, MD;  Location: [facility];  Service: Gastroenterology;;   [address]  07/08/2023   Procedure: [address];  Surgeon: Normie Becton., MD;  Location: [facility];  Service: Gastroenterology;;   TOTAL HIP ARTHROPLASTY Left 10/01/2017   Procedure: LEFT  TOTAL HIP ARTHROPLASTY ANTERIOR APPROACH;  Surgeon: Liliane Rei, MD;  Location: [facility];  Service: Orthopedics;  Laterality: Left;   TOTAL HIP ARTHROPLASTY Right 03/11/2018   Procedure: RIGHT TOTAL HIP ARTHROPLASTY ANTERIOR APPROACH;  Surgeon: Liliane Rei, MD;  Location: [facility];  Service: Orthopedics;  Laterality: Right;    UPPER GASTROINTESTINAL ENDOSCOPY      I have reviewed the social history and family history with the patient and they are unchanged from previous note.  ALLERGIES:  is allergic to lisinopril , oxaliplatin , statins, and atorvastatin .  MEDICATIONS:  Current Outpatient Medications  Medication Sig Dispense Refill   Cholecalciferol (VITAMIN D -3) 125 MCG (5000 UT) TABS Take 1 tablet by mouth daily.     famotidine  (PEPCID ) 20 MG tablet Take 1 tablet (20 mg total) by mouth 2 (two) times daily. (Patient taking differently: Take 20 mg by mouth daily.) 30 tablet 1   fluticasone  (FLONASE ) 50 MCG/ACT nasal spray Place 2 sprays into both nostrils daily. 16 g 6   Glucose Blood (BLOOD GLUCOSE TEST STRIPS) STRP 1 each by In Vitro route in the morning, at noon, and at bedtime. May substitute to any manufacturer covered by patient's insurance. 100 strip 5   insulin  aspart (NOVOLOG ) 100 UNIT/ML FlexPen Inject 0-6 Units into the skin 3 (three) times daily with meals. Check Blood Glucose (BG) and  inject per scale: BG <150= 0 unit; BG 150-200= 1 unit; BG 201-250= 2 unit; BG 251-300= 3 unit; BG 301-350= 4 unit; BG 351-400= 5 unit; BG >400= 6 unit and Call Primary Care. 15 mL 0   Insulin  Glargine (BASAGLAR  KWIKPEN) 100 UNIT/ML Inject 12 Units into the skin daily. May substitute as needed per insurance. (Patient taking differently: Inject 12 Units into the skin at bedtime.) 15 mL 2   Insulin  Pen Needle (PEN NEEDLES) 31G X 5 MM MISC 1 each by Does not apply route 3 (three) times daily. May dispense any manufacturer covered by patient's insurance. 100 each 0   Lancets (ONETOUCH DELICA PLUS LANCET33G) MISC 1 each by Does not apply route 3 (three) times daily. 100 each 0   lidocaine -prilocaine  (EMLA ) cream Apply 1 Application topically as needed. 30 g 2   losartan  (COZAAR ) 100 MG tablet Take 1 tablet (100 mg total) by mouth daily. 90 tablet 1   NIFEdipine  (ADALAT  CC) 60 MG 24 hr tablet  Take 1 tablet (60 mg total) by mouth daily. 90 tablet 1   ondansetron  (ZOFRAN ) 8 MG tablet Take 1 tablet (8 mg total) by mouth every 8 (eight) hours as needed for nausea or vomiting. 20 tablet 1   prochlorperazine  (COMPAZINE ) 10 MG tablet Take 1 tablet (10 mg total) by mouth every 6 (six) hours as needed for nausea or vomiting. 30 tablet 2   thyroid  (NP THYROID ) 90 MG tablet Take 1 tablet (90 mg total) by mouth daily. 30 tablet 5   potassium chloride  (KLOR-CON ) 10 MEQ tablet Take 1 tablet (10 mEq total) by mouth 3 (three) times daily for 7 days, THEN 1 tablet (10 mEq total) daily for 23 days. 44 tablet 0   No current facility-administered medications for this visit.    PHYSICAL EXAMINATION: ECOG PERFORMANCE STATUS: 1 - Symptomatic but completely ambulatory  Vitals:   11/11/23 0904 11/11/23 0905  BP: (!) 148/76 (!) 144/78  Pulse: 93   Resp: 18   Temp: 97.6 F (36.4 C)   SpO2: 99%    Wt Readings from Last 3 Encounters:  11/11/23 154 lb 14.4 oz (70.3 kg)  10/29/23 155 lb (70.3 kg)  10/28/23 156 lb 1.6 oz  (70.8 kg)     GENERAL:alert, no distress and comfortable SKIN: skin color, texture, turgor are normal, no rashes or significant lesions EYES: normal, Conjunctiva are pink and non-injected, sclera clear NECK: supple, thyroid  normal size, non-tender, without nodularity LYMPH:  no palpable lymphadenopathy in the cervical, axillary  LUNGS: clear to auscultation and percussion with normal breathing effort HEART: regular rate & rhythm and no murmurs and no lower extremity edema ABDOMEN:abdomen soft, non-tender and normal bowel sounds Musculoskeletal:no cyanosis of digits and no clubbing  NEURO: alert & oriented x 3 with fluent speech, no focal motor/sensory deficits  Physical Exam MEASUREMENTS: Weight- 154.  LABORATORY DATA:  I have reviewed the data as listed    Latest Ref Rng & Units 11/11/2023    8:48 AM 10/28/2023    8:41 AM 10/20/2023    2:48 PM  CBC  WBC 4.0 - 10.5 K/uL 22.9  26.0  34.4   Hemoglobin 12.0 - 15.0 g/dL 7.6  8.2  9.1   Hematocrit 36.0 - 46.0 % 22.6  24.6  26.6   Platelets 150 - 400 K/uL 182  181  127         Latest Ref Rng & Units 11/11/2023    8:48 AM 10/28/2023    8:41 AM 10/20/2023    2:48 PM  CMP  Glucose 70 - 99 mg/dL 161  096  045   BUN 8 - 23 mg/dL 11  12  15    Creatinine 0.44 - 1.00 mg/dL 4.09  8.11  9.14   Sodium 135 - 145 mmol/L 141  142  141   Potassium 3.5 - 5.1 mmol/L 3.4  3.0  4.0   Chloride 98 - 111 mmol/L 108  108  109   CO2 22 - 32 mmol/L 24  26  27    Calcium  8.9 - 10.3 mg/dL 7.3  7.6  8.3   Total Protein 6.5 - 8.1 g/dL 6.1  6.2  6.6   Total Bilirubin 0.0 - 1.2 mg/dL 0.3  0.3  0.5   Alkaline Phos 38 - 126 U/L 245  305  453   AST 15 - 41 U/L 25  21  34   ALT 0 - 44 U/L 26  21  49       RADIOGRAPHIC  STUDIES: I have personally reviewed the radiological images as listed and agreed with the findings in the report. No results found.    Orders Placed This Encounter  Procedures   Informed Consent Details: Physician/Practitioner Attestation;  Transcribe to consent form and obtain patient signature    Physician/Practitioner attestation of informed consent for blood and or blood product transfusion:   I, the physician/practitioner, attest that I have discussed with the patient the benefits, risks, side effects, alternatives, likelihood of achieving goals and potential problems during recovery for the procedure that I have provided informed consent.    Product(s):   All Product(s)   All questions were answered. The patient knows to call the clinic with any problems, questions or concerns. No barriers to learning was detected. The total time spent in the appointment was 40 minutes, including review of chart and various tests results, discussions about plan of care and coordination of care plan     Sonja [place], MD 11/11/2023

## 2023-11-11 NOTE — Patient Instructions (Addendum)
 [facility] - A DEPT OF [facility]  Discharge Instructions: Thank you for choosing [facility] to provide your oncology and hematology care.   If you have a lab appointment with the Cancer Center, please go directly to the Cancer Center and check in at the registration area.   Wear comfortable clothing and clothing appropriate for easy access to any Portacath or PICC line.   We strive to give you quality time with your provider. You may need to reschedule your appointment if you arrive late (15 or more minutes).  Arriving late affects you and other patients whose appointments are after yours.  Also, if you miss three or more appointments without notifying the office, you may be dismissed from the clinic at the provider's discretion.      For prescription refill requests, have your pharmacy contact our office and allow 72 hours for refills to be completed.    Today you received the following chemotherapy and/or immunotherapy agent: oxaliplatin , leucovorin , irinotecan , fluorourcil      To help prevent nausea and vomiting after your treatment, we encourage you to take your nausea medication as directed.  BELOW ARE SYMPTOMS THAT SHOULD BE REPORTED IMMEDIATELY: *FEVER GREATER THAN 100.4 F (38 C) OR HIGHER *CHILLS OR SWEATING *NAUSEA AND VOMITING THAT IS NOT CONTROLLED WITH YOUR NAUSEA MEDICATION *UNUSUAL SHORTNESS OF BREATH *UNUSUAL BRUISING OR BLEEDING *URINARY PROBLEMS (pain or burning when urinating, or frequent urination) *BOWEL PROBLEMS (unusual diarrhea, constipation, pain near the anus) TENDERNESS IN MOUTH AND THROAT WITH OR WITHOUT PRESENCE OF ULCERS (sore throat, sores in mouth, or a toothache) UNUSUAL RASH, SWELLING OR PAIN  UNUSUAL VAGINAL DISCHARGE OR ITCHING   Items with * indicate a potential emergency and should be followed up as soon as possible or go to the Emergency Department if any problems should occur.  Please show the  CHEMOTHERAPY ALERT CARD or IMMUNOTHERAPY ALERT CARD at check-in to the Emergency Department and triage nurse.  Should you have questions after your visit or need to cancel or reschedule your appointment, please contact [facility] - A DEPT OF [facility]  Dept: [phone]  and follow the prompts.  Office hours are 8:00 a.m. to 4:30 p.m. Monday - Friday. Please note that voicemails left after 4:00 p.m. may not be returned until the following business day.  We are closed weekends and major holidays. You have access to a nurse at all times for urgent questions. Please call the main number to the clinic Dept: [phone] and follow the prompts.   For any non-urgent questions, you may also contact your provider using MyChart. We now offer e-Visits for anyone 62 and older to request care online for non-urgent symptoms. For details visit [email or web address].   Also download the MyChart app! Go to the app store, search "MyChart", open the app, select [facility], and log in with your MyChart username and password.

## 2023-11-11 NOTE — Progress Notes (Signed)
 Nutrition follow-up completed with patient during infusion for pancreas cancer.  Patient is receiving cycle 8 of FOLFIRINOX and is followed by Dr. Maryalice Smaller.  Weight stable and documented as 154 pounds 14.4 ounces May 20.  Patient weighed 153 pounds 4.8 ounces April 22.  Labs include glucose 120, potassium 3.4, hemoglobin 7.6.  Patient complains that taste alterations continue.  She is forcing herself to eat.  She has increased fresh fruits which tastes good to her.  Noted that patient fell on May 7 and needed to get stitches in her chin.  She denies chewing and swallowing problems.  Noted stools are loose but not considered diarrhea.  Patient takes Lantus  in evening before bed and reports sometimes her blood sugars are very low in the morning.  Reports ranges from 49-113.  She is eating a small bedtime snack.  Nutrition diagnosis: Food and nutrition related knowledge deficit, ongoing.  Intervention: Educated to continue small frequent meals and snacks with adequate calories and protein for continued weight maintenance. Reviewed high-protein foods to incorporate more often. Encouraged patient to discuss blood sugar ranges with provider who prescribes insulin .  Recommended patient consume a larger snack that contains complex carbohydrate along with protein and healthy fat at bedtime.  Monitoring, evaluation, goals: Patient will tolerate adequate calories and protein to minimize weight loss.  Next visit: To be scheduled with upcoming treatment as needed.  **Disclaimer: This note was dictated with voice recognition software. Similar sounding words can inadvertently be transcribed and this note may contain transcription errors which may not have been corrected upon publication of note.**

## 2023-11-12 ENCOUNTER — Other Ambulatory Visit: Payer: Self-pay

## 2023-11-13 ENCOUNTER — Encounter

## 2023-11-13 ENCOUNTER — Inpatient Hospital Stay

## 2023-11-13 VITALS — BP 142/80 | HR 78 | Temp 98.1°F | Resp 18

## 2023-11-13 DIAGNOSIS — K8689 Other specified diseases of pancreas: Secondary | ICD-10-CM

## 2023-11-13 DIAGNOSIS — D0511 Intraductal carcinoma in situ of right breast: Secondary | ICD-10-CM

## 2023-11-13 DIAGNOSIS — Z5111 Encounter for antineoplastic chemotherapy: Secondary | ICD-10-CM | POA: Diagnosis not present

## 2023-11-13 DIAGNOSIS — D649 Anemia, unspecified: Secondary | ICD-10-CM

## 2023-11-13 DIAGNOSIS — C259 Malignant neoplasm of pancreas, unspecified: Secondary | ICD-10-CM

## 2023-11-13 DIAGNOSIS — C250 Malignant neoplasm of head of pancreas: Secondary | ICD-10-CM

## 2023-11-13 DIAGNOSIS — D6481 Anemia due to antineoplastic chemotherapy: Secondary | ICD-10-CM

## 2023-11-13 MED ORDER — SODIUM CHLORIDE 0.9% IV SOLUTION
250.0000 mL | INTRAVENOUS | Status: DC
  Administered 2023-11-13: 100 mL via INTRAVENOUS

## 2023-11-13 MED ORDER — PEGFILGRASTIM-CBQV 6 MG/0.6ML ~~LOC~~ SOSY
6.0000 mg | PREFILLED_SYRINGE | Freq: Once | SUBCUTANEOUS | Status: AC
Start: 2023-11-13 — End: 2023-11-13
  Administered 2023-11-13: 6 mg via SUBCUTANEOUS
  Filled 2023-11-13: qty 0.6

## 2023-11-13 MED ORDER — HEPARIN SOD (PORK) LOCK FLUSH 100 UNIT/ML IV SOLN
500.0000 [IU] | Freq: Once | INTRAVENOUS | Status: AC | PRN
  Administered 2023-11-13: 500 [IU]

## 2023-11-13 MED ORDER — SODIUM CHLORIDE 0.9% FLUSH
10.0000 mL | INTRAVENOUS | Status: DC | PRN
  Administered 2023-11-13: 10 mL

## 2023-11-13 NOTE — Patient Instructions (Signed)

## 2023-11-14 ENCOUNTER — Encounter

## 2023-11-14 ENCOUNTER — Encounter: Payer: Self-pay | Admitting: Cardiology

## 2023-11-14 ENCOUNTER — Encounter: Payer: Self-pay | Admitting: Hematology

## 2023-11-14 LAB — TYPE AND SCREEN
ABO/RH(D): B POS
Antibody Screen: NEGATIVE
Unit division: 0
Unit division: 0

## 2023-11-14 LAB — BPAM RBC
Blood Product Expiration Date: 202506182359
Blood Product Expiration Date: 202506182359
ISSUE DATE / TIME: 202505220927
ISSUE DATE / TIME: 202505220927
Unit Type and Rh: 7300
Unit Type and Rh: 7300

## 2023-11-14 NOTE — Progress Notes (Signed)
 The proposed treatment discussed in conference is for discussion purpose only and is not a binding recommendation.  The patients have not been physically examined, or presented with their treatment options.  Therefore, final treatment plans cannot be decided.

## 2023-11-17 ENCOUNTER — Encounter: Payer: Self-pay | Admitting: Hematology

## 2023-11-17 NOTE — Addendum Note (Signed)
 Addended by: Sonja Badger on: 11/17/2023 05:25 PM   Modules accepted: Orders

## 2023-11-18 DIAGNOSIS — C259 Malignant neoplasm of pancreas, unspecified: Secondary | ICD-10-CM | POA: Diagnosis not present

## 2023-11-18 DIAGNOSIS — Z09 Encounter for follow-up examination after completed treatment for conditions other than malignant neoplasm: Secondary | ICD-10-CM | POA: Diagnosis not present

## 2023-11-24 NOTE — Assessment & Plan Note (Addendum)
 cT2N0M0, stage IB -Patient presented with obstructive jaundice, CT and MRI showed a 2.5 cm mass in the head of the pancreas, with pancreatic duct and biliary duct dilatation.  ERCP was attempted twice but failed due to deformed duodenum -She underwent ERCP and stent placement, EUS with fine-needle biopsy of the pancreatic mass by Dr. Brice Campi on July 08, 2023.  Biopsy confirmed adenocarcinoma. -Due to the tumor invasion to portal vein and SMV, I recommended neoadjuvant chemotherapy FOLFIRINOX, she started on 08/05/2023 -restaging CT 11/05/2023 showed stable disease  - Patient was seen by pancreatobiliary surgeon Dr. Leighton Punches, who also consulted her colleagues at [facility], and they recommend a total of 6 months neoadjuvant chemotherapy.  Will change her chemo to second line gemcitabine and Abraxane for additional 3 months.

## 2023-11-24 NOTE — Progress Notes (Signed)
 Pharmacist Chemotherapy Monitoring - Initial Assessment    Anticipated start date: 12/02/23   The following has been reviewed per standard work regarding the patient's treatment regimen: The patient's diagnosis, treatment plan and drug doses, and organ/hematologic function Lab orders and baseline tests specific to treatment regimen  The treatment plan start date, drug sequencing, and pre-medications Prior authorization status  Patient's documented medication list, including drug-drug interaction screen and prescriptions for anti-emetics and supportive care specific to the treatment regimen The drug concentrations, fluid compatibility, administration routes, and timing of the medications to be used The patient's access for treatment and lifetime cumulative dose history, if applicable  The patient's medication allergies and previous infusion related reactions, if applicable   Changes made to treatment plan:  N/A  Follow up needed:  N/A   Rayona Sardinha, PharmD, MBA

## 2023-11-25 ENCOUNTER — Encounter: Payer: Self-pay | Admitting: Hematology

## 2023-11-25 ENCOUNTER — Inpatient Hospital Stay

## 2023-11-25 ENCOUNTER — Encounter: Payer: Self-pay | Admitting: Internal Medicine

## 2023-11-25 ENCOUNTER — Inpatient Hospital Stay: Attending: Nurse Practitioner | Admitting: Hematology

## 2023-11-25 ENCOUNTER — Ambulatory Visit: Payer: Self-pay

## 2023-11-25 VITALS — BP 162/80 | HR 87 | Temp 98.1°F | Resp 18 | Ht 64.0 in | Wt 153.1 lb

## 2023-11-25 DIAGNOSIS — Z79633 Long term (current) use of mitotic inhibitor: Secondary | ICD-10-CM | POA: Diagnosis not present

## 2023-11-25 DIAGNOSIS — M10072 Idiopathic gout, left ankle and foot: Secondary | ICD-10-CM | POA: Insufficient documentation

## 2023-11-25 DIAGNOSIS — Z79631 Long term (current) use of antimetabolite agent: Secondary | ICD-10-CM | POA: Insufficient documentation

## 2023-11-25 DIAGNOSIS — C259 Malignant neoplasm of pancreas, unspecified: Secondary | ICD-10-CM

## 2023-11-25 DIAGNOSIS — M109 Gout, unspecified: Secondary | ICD-10-CM | POA: Diagnosis not present

## 2023-11-25 DIAGNOSIS — C250 Malignant neoplasm of head of pancreas: Secondary | ICD-10-CM | POA: Insufficient documentation

## 2023-11-25 DIAGNOSIS — Z95828 Presence of other vascular implants and grafts: Secondary | ICD-10-CM

## 2023-11-25 DIAGNOSIS — Z5111 Encounter for antineoplastic chemotherapy: Secondary | ICD-10-CM | POA: Diagnosis not present

## 2023-11-25 LAB — CMP (CANCER CENTER ONLY)
ALT: 24 U/L (ref 0–44)
AST: 26 U/L (ref 15–41)
Albumin: 3.6 g/dL (ref 3.5–5.0)
Alkaline Phosphatase: 247 U/L — ABNORMAL HIGH (ref 38–126)
Anion gap: 10 (ref 5–15)
BUN: 9 mg/dL (ref 8–23)
CO2: 26 mmol/L (ref 22–32)
Calcium: 7.3 mg/dL — ABNORMAL LOW (ref 8.9–10.3)
Chloride: 109 mmol/L (ref 98–111)
Creatinine: 0.82 mg/dL (ref 0.44–1.00)
GFR, Estimated: >60 mL/min (ref >60)
Glucose, Bld: 152 mg/dL — ABNORMAL HIGH (ref 70–99)
Potassium: 3.2 mmol/L — ABNORMAL LOW (ref 3.5–5.1)
Sodium: 145 mmol/L (ref 135–145)
Total Bilirubin: 0.3 mg/dL (ref 0.0–1.2)
Total Protein: 6.7 g/dL (ref 6.5–8.1)

## 2023-11-25 LAB — CBC WITH DIFFERENTIAL (CANCER CENTER ONLY)
Abs Immature Granulocytes: 1.2 10*3/uL — ABNORMAL HIGH (ref 0.00–0.07)
Basophils Absolute: 0.1 10*3/uL (ref 0.0–0.1)
Basophils Relative: 1 %
Eosinophils Absolute: 0.1 10*3/uL (ref 0.0–0.5)
Eosinophils Relative: 0 %
HCT: 29.1 % — ABNORMAL LOW (ref 36.0–46.0)
Hemoglobin: 9.6 g/dL — ABNORMAL LOW (ref 12.0–15.0)
Immature Granulocytes: 5 %
Lymphocytes Relative: 9 %
Lymphs Abs: 2.4 10*3/uL (ref 0.7–4.0)
MCH: 30.7 pg (ref 26.0–34.0)
MCHC: 33 g/dL (ref 30.0–36.0)
MCV: 93 fL (ref 80.0–100.0)
Monocytes Absolute: 2.1 10*3/uL — ABNORMAL HIGH (ref 0.1–1.0)
Monocytes Relative: 8 %
Neutro Abs: 20.7 10*3/uL — ABNORMAL HIGH (ref 1.7–7.7)
Neutrophils Relative %: 77 %
Platelet Count: 133 10*3/uL — ABNORMAL LOW (ref 150–400)
RBC: 3.13 MIL/uL — ABNORMAL LOW (ref 3.87–5.11)
RDW: 23.2 % — ABNORMAL HIGH (ref 11.5–15.5)
WBC Count: 26.6 10*3/uL — ABNORMAL HIGH (ref 4.0–10.5)
nRBC: 0.1 % (ref 0.0–0.2)

## 2023-11-25 MED ORDER — PREDNISONE 10 MG (21) PO TBPK: ORAL_TABLET | ORAL | 0 refills | Status: DC

## 2023-11-25 MED ORDER — SODIUM CHLORIDE 0.9% FLUSH
10.0000 mL | Freq: Once | INTRAVENOUS | Status: AC
  Administered 2023-11-25: 10 mL

## 2023-11-25 NOTE — Progress Notes (Signed)
 [facility]   [phone] Fax:([phone]   Clinic Follow up Note   Patient Care Team: [name], MD as PCP - General (Internal Medicine) Eilleen Grates, MD as PCP - Cardiology (Cardiology) Alane Hsu, RN as Oncology Nurse Navigator Auther Bo, RN as Oncology Nurse Navigator Caralyn Chandler, MD as Consulting Physician (General Surgery) Sonja Davie, MD as Consulting Physician (Hematology) Colie Dawes, MD as Attending Physician (Radiation Oncology) Myrle Aspen, [facility] (Inactive) as Pharmacist (Pharmacist)  Date of Service:  11/25/2023  CHIEF COMPLAINT: f/u of pancreatic cancer  CURRENT THERAPY:  Second line neoadjuvant chemotherapy gemcitabine and Abraxane  Oncology History   Pancreatic adenocarcinoma (HCC) cT2N0M0, stage IB -Patient presented with obstructive jaundice, CT and MRI showed a 2.5 cm mass in the head of the pancreas, with pancreatic duct and biliary duct dilatation.  ERCP was attempted twice but failed due to deformed duodenum -She underwent ERCP and stent placement, EUS with fine-needle biopsy of the pancreatic mass by Dr. Brice Campi on July 08, 2023.  Biopsy confirmed adenocarcinoma. -Due to the tumor invasion to portal vein and SMV, I recommended neoadjuvant chemotherapy FOLFIRINOX, she started on 08/05/2023 -restaging CT 11/05/2023 showed stable disease  - Patient was seen by pancreatobiliary surgeon Dr. Leighton Punches, who also consulted her colleagues at [facility], and they recommend a total of 6 months neoadjuvant chemotherapy.  Will change her chemo to second line gemcitabine and Abraxane for additional 3 months.  Assessment & Plan Pancreatic cancer Pancreatic cancer under chemotherapy treatment. Recent surgical consultation at [facility] advised continuation of current regimen: two weeks on, one week off. Today's session postponed due to acute gout flare to prevent interaction affecting healing. Next sessions on June 10 and June 17.  Discussed chemotherapy side effects: neuropathy, alopecia, cytopenias, fatigue, anorexia. Current regimen is shorter, better tolerated with less fatigue and diarrhea. Recommended cryotherapy during chemotherapy to mitigate neuropathy risk. - Postpone chemotherapy for one week. - Reschedule chemotherapy for June 10 and June 17. - Use cryotherapy during chemotherapy to reduce neuropathy risk.  Gout Acute gout flare in left foot, affecting hallux and ankle, causing significant pain and swelling. Afebrile. Currently wheelchair-bound due to pain. Advised to consult primary care physician for management, as it is outside oncological scope. Discussed potential treatments: NSAIDs, prescription medications, corticosteroids, colchicine. - Consult primary care physician for gout management. - Consider urgent care if primary care physician is unavailable.  Anemia Anemia with recent hemoglobin of 9.6, improved post-transfusion from two weeks ago. Current hemoglobin is well-managed.  Hypokalemia Hypokalemia with potassium level of 3.2. Not on potassium supplements. Advised to initiate home potassium supplementation with increased dosage for three days. - Initiate potassium supplements twice daily for three days, then reduce to once daily.  Plan - Due to her gout flare, will hold on chemotherapy today, started first cycle of gemcitabine and Abraxane in a week - I recommended her to call her PCP or go to urgent care for gout treatment - Follow-up next week before chemo   SUMMARY OF ONCOLOGIC HISTORY: Oncology History  Ductal carcinoma in situ (DCIS) of right breast  01/28/2019 Mammogram   Diagnostic Mammogram 01/28/19  IMPRESSION: Right breast retroareolar 7 mm grouped pleomorphic calcifications are suspicious.   02/02/2019 Initial Biopsy   Diagnosis 02/02/19 Breast, right, needle core biopsy, outer retroareolar - DUCTAL CARCINOMA IN SITU WITH CALCIFICATIONS. Microscopic Comment The ductal carcinoma  in situ is intermediate grade. Estrogen and progesterone receptors will be performed.  Results: IMMUNOHISTOCHEMICAL AND MORPHOMETRIC ANALYSIS  PERFORMED MANUALLY Estrogen Receptor: 90%, POSITIVE, STRONG STAINING INTENSITY Progesterone Receptor: 20%, POSITIVE, STRONG STAINING INTENSITY    02/02/2019 Cancer Staging   Staging form: Breast, AJCC 8th Edition - Clinical stage from 02/02/2019: Stage 0 (cTis (DCIS), cN0, cM0, G2, ER+, PR+, HER2: Not Assessed) - Signed by Sonja Jersey, MD on 02/10/2019   02/04/2019 Initial Diagnosis   Ductal carcinoma in situ (DCIS) of right breast   03/18/2019 Surgery   RIGHT BREAST LUMPECTOMY WITH RADIOACTIVE SEED LOCALIZATION by Dr. Alethea Andes 03/18/19   03/18/2019 Pathology Results   FINAL MICROSCOPIC DIAGNOSIS: 03/18/19 A. BREAST, RIGHT, LUMPECTOMY:  - Ductal carcinoma in situ with calcifications and necrosis, 1.4 cm.  - Ductal carcinoma in situ focally 0.1 cm from medial and anterior  lumpectomy margins.  - Ductal carcinoma in situ involves the final anterior margin (See part  D).  - Ductal carcinoma in situ focally 0.3 cm from superior lumpectomy  margin.  - Biopsy site and biopsy clip.   B. BREAST, RIGHT LATERAL MARGIN, EXCISION:  - Benign breast tissue.  - No ductal carcinoma in situ.  - Final lateral margin greater than 1 cm.   C. BREAST, RIGHT MEDIAL MARGIN, EXCISION:  - Ductal carcinoma in situ, 0.3 cm.  - Ductal carcinoma in situ with focally 0.3 cm from final medial margin.   D. BREAST, RIGHT ANTERIOR MARGIN, EXCISION:  - Ductal carcinoma in situ, 1.5 cm.  - Ductal carcinoma in situ focally involves final anterior margin.   E. BREAST, RIGHT DEEP MARGIN, EXCISION:  - Fibrocystic changes.  - No ductal carcinoma in situ.  - Final posterior margin greater than 0.7 cm.    04/08/2019 Surgery   RE-EXCISION OF RIGHT BREAST ANTERIOR MARGINS by Dr Alethea Andes 04/08/19   04/08/2019 Pathology Results    DIAGNOSIS: 04/08/19  A. BREAST, RIGHT, ANTERIOR SUPERIOR,  EXCISION:  - Intermediate grade ductal carcinoma in situ with necrosis.  - In situ carcinoma is <1 mm (not on ink) from the new anterior superior  margin, multifocally.  - Intraductal papilloma.  - Fibrocystic change.   B. BREAST, RIGHT, ANTERIOR INFERIOR, EXCISION:  - Benign breast tissue with resection changes.   C. BREAST, RIGHT, MEDIAL, EXCISION:  - Benign breast tissue with resection changes.  - Fibroadenomatoid and fibrocystic changes.   05/06/2019 Mammogram   Right Mammogram 05/06/19 IMPRESSION: Suspicious finding with 2 groups of microcalcifications in the medial right breast. The anterior group measures 2 x 2 x 2 mm. The more posterior group measures 4 x 3 x 4 mm.   05/12/2019 Pathology Results   Diagnosis 05/12/19 1. Breast, right, needle core biopsy, medial - DUCTAL CARCINOMA IN SITU WITH CALCIFICATIONS. - SEE MICROSCOPIC DESCRIPTION. 2. Breast, right, needle core biopsy, medial - DUCTAL CARCINOMA IN SITU WITH CALCIFICATIONS. - SEE MICROSCOPIC DESCRIPTION. Results: IMMUNOHISTOCHEMICAL AND MORPHOMETRIC ANALYSIS PERFORMED MANUALLY Estrogen Receptor: 100%, POSITIVE, STRONG STAINING INTENSITY Progesterone Receptor: 0%, NEGATIVE COMMENT: The negative hormone receptor study(ies) in this case has an internal positive control. Results: IMMUNOHISTOCHEMICAL AND MORPHOMETRIC ANALYSIS PERFORMED MANUALLY Estrogen Receptor: 100%, POSITIVE, STRONG STAINING INTENSITY Progesterone Receptor: 0%, NEGATIVE COMMENT: The negative hormone receptor study(ies) in this case has an internal positive control.   07/26/2019 Surgery   RIGHT MASTECTOMY WITH SENTINEL NODE BIOPSY by Dr. Alethea Andes 07/26/19   07/26/2019 Pathology Results   FINAL MICROSCOPIC DIAGNOSIS: 07/26/19  A. LYMPH NODE, RIGHT #1, SENTINEL,  BIOPSY:  -  No carcinoma identified in one lymph node (0/1)   B. BREAST, RIGHT, MASTECTOMY:  -  Ductal carcinoma in  situ, intermediate grade, 0.6 cm  -  Margins uninvolved by carcinoma (greater  than 1 cm; posterior margin)  -  Previous biopsy site changes (x2)  -  See oncology table below   07/26/2019 Cancer Staging   Staging form: Breast, AJCC 8th Edition - Pathologic stage from 07/26/2019: Stage 0 (pTis (DCIS), pN0, cM0, G2, ER+, PR+, HER2: Not Assessed) - Signed by Sonja [place], MD on 08/10/2019   08/15/2023 Genetic Testing   Negative [facility] CancerNext-Expanded +RNAinsight Panel.  VUS in ATM at c.4910-891G>A and SDHA at c.-1C>G.  Report date is 08/15/2023.   The CancerNext-Expanded gene panel offered by [facility] and includes sequencing, rearrangement, and RNA analysis for the following 76 genes: AIP, ALK, APC, ATM, AXIN2, BAP1, BARD1, BMPR1A, BRCA1, BRCA2, BRIP1, CDC73, CDH1, CDK4, CDKN1B, CDKN2A, CEBPA, CHEK2, CTNNA1, DDX41, DICER1, ETV6, FH, FLCN, GATA2, LZTR1, MAX, MBD4, MEN1, MET, MLH1, MSH2, MSH3, MSH6, MUTYH, NF1, NF2, NTHL1, PALB2, PHOX2B, PMS2, POT1, PRKAR1A, PTCH1, PTEN, RAD51C, RAD51D, RB1, RET, RUNX1, SDHA, SDHAF2, SDHB, SDHC, SDHD, SMAD4, SMARCA4, SMARCB1, SMARCE1, STK11, SUFU, TMEM127, TP53, TSC1, TSC2, VHL, and WT1 (sequencing and deletion/duplication); EGFR, HOXB13, KIT, MITF, PDGFRA, POLD1, and POLE (sequencing only); EPCAM and GREM1 (deletion/duplication only).    Pancreatic adenocarcinoma (HCC)  07/07/2022 Cancer Staging   Staging form: Exocrine Pancreas, AJCC 8th Edition - Clinical stage from 07/07/2022: Stage IB (cT2, cN0, cM0) - Signed by Sonja Sedgwick, MD on 07/22/2023 Stage prefix: Initial diagnosis Total positive nodes: 0   06/13/2023 Initial Diagnosis   Pancreatic adenocarcinoma (HCC)   08/06/2023 - 11/13/2023 Chemotherapy   Patient is on Treatment Plan : PANCREAS Modified FOLFIRINOX q14d x 4 cycles     08/15/2023 Genetic Testing   Negative [facility] CancerNext-Expanded +RNAinsight Panel.  VUS in ATM at c.4910-891G>A and SDHA at c.-1C>G.  Report date is 08/15/2023.   The CancerNext-Expanded gene panel offered by [facility] and includes sequencing, rearrangement, and  RNA analysis for the following 76 genes: AIP, ALK, APC, ATM, AXIN2, BAP1, BARD1, BMPR1A, BRCA1, BRCA2, BRIP1, CDC73, CDH1, CDK4, CDKN1B, CDKN2A, CEBPA, CHEK2, CTNNA1, DDX41, DICER1, ETV6, FH, FLCN, GATA2, LZTR1, MAX, MBD4, MEN1, MET, MLH1, MSH2, MSH3, MSH6, MUTYH, NF1, NF2, NTHL1, PALB2, PHOX2B, PMS2, POT1, PRKAR1A, PTCH1, PTEN, RAD51C, RAD51D, RB1, RET, RUNX1, SDHA, SDHAF2, SDHB, SDHC, SDHD, SMAD4, SMARCA4, SMARCB1, SMARCE1, STK11, SUFU, TMEM127, TP53, TSC1, TSC2, VHL, and WT1 (sequencing and deletion/duplication); EGFR, HOXB13, KIT, MITF, PDGFRA, POLD1, and POLE (sequencing only); EPCAM and GREM1 (deletion/duplication only).    12/02/2023 -  Chemotherapy   Patient is on Treatment Plan : PANCREATIC Abraxane D1,8 + Gemcitabine D1,8 q21d        Discussed the use of AI scribe software for clinical note transcription with the patient, who gave verbal consent to proceed.  History of Present Illness Adrienne [name] is a 78 year old female with pancreatic cancer who presents for follow-up.  She is undergoing chemotherapy for pancreatic cancer, with the last session three weeks ago. She is scheduled to start chemotherapy today, following a regimen of two weeks on and one week off. She has been in treatment for three months and anticipates three more months. Darkness in her hands and feet is noted, with neuropathy discussed as a potential side effect.  Swelling in the left foot, particularly in the big toe and ankle, began yesterday morning. The swelling is very painful and affects her mobility, necessitating the use of a wheelchair, though she can walk slowly. No fever is present.  Recent blood work shows a high  white blood cell count, hemoglobin at 9.6, and platelet count of 133. A blood transfusion two weeks ago helped maintain hemoglobin levels. Potassium is low at 3.2. She has potassium pills at home but has difficulty swallowing them and needs to soften them before ingestion.     All other  systems were reviewed with the patient and are negative.  MEDICAL HISTORY:  Past Medical History:  Diagnosis Date   Allergy    Ambulates with cane    straight cane   Arthritis    knee, back   CHF (congestive heart failure) (HCC)    Ductal carcinoma in situ of breast 12/2018   R Breast-mastectomy only   Gout    History of blood transfusion 1986   w/ Hysterectomy surgery   Hyperlipidemia    diet controlled - no meds   Hypertension    Hypothyroidism    Pelvic kidney    lower right pelvic kidney    SBO (small bowel obstruction) (HCC) 10/2016   surgery    Sleep apnea    Mild - no mask needed per sleep study   Smoker    quit smoking 2018   Type 2 diabetes mellitus (HCC)     SURGICAL HISTORY: Past Surgical History:  Procedure Laterality Date   ABDOMINAL HYSTERECTOMY  1986   COMPLETE-precancerous   APPENDECTOMY     pt states it was removed when gallbladder was removed.   AUGMENTATION MAMMAPLASTY     BACK SURGERY     lower back   BILIARY BRUSHING  07/08/2023   Procedure: BILIARY BRUSHING;  Surgeon: Brice Campi Albino Alu., MD;  Location: [facility];  Service: Gastroenterology;;   BILIARY STENT PLACEMENT N/A 07/08/2023   Procedure: BILIARY STENT PLACEMENT;  Surgeon: Normie Becton., MD;  Location: [facility];  Service: Gastroenterology;  Laterality: N/A;   BIOPSY  06/14/2023   Procedure: BIOPSY;  Surgeon: Kenney Peacemaker, MD;  Location: [facility];  Service: Gastroenterology;;   BIOPSY  07/08/2023   Procedure: BIOPSY;  Surgeon: Normie Becton., MD;  Location: [facility];  Service: Gastroenterology;;   BREAST BIOPSY Right 05/12/2019   times 2   BREAST LUMPECTOMY WITH RADIOACTIVE SEED LOCALIZATION Right 03/18/2019   Procedure: RIGHT BREAST LUMPECTOMY WITH RADIOACTIVE SEED LOCALIZATION;  Surgeon: Caralyn Chandler, MD;  Location: [facility];  Service: General;  Laterality: Right;   CHOLECYSTECTOMY     COLONOSCOPY  02/15/2008   [facility]    COLONOSCOPY WITH  PROPOFOL   02/27/2018   Dr.Nandigam   ECTOPIC PREGNANCY SURGERY     ERCP N/A 06/14/2023   Procedure: ENDOSCOPIC RETROGRADE CHOLANGIOPANCREATOGRAPHY (ERCP);  Surgeon: Kenney Peacemaker, MD;  Location: [facility];  Service: Gastroenterology;  Laterality: N/A;   ERCP N/A 06/27/2023   Procedure: ENDOSCOPIC RETROGRADE CHOLANGIOPANCREATOGRAPHY (ERCP);  Surgeon: Lajuan Pila, MD;  Location: [facility];  Service: Gastroenterology;  Laterality: N/A;   ERCP N/A 07/08/2023   Procedure: ENDOSCOPIC RETROGRADE CHOLANGIOPANCREATOGRAPHY (ERCP);  Surgeon: Normie Becton., MD;  Location: [facility];  Service: Gastroenterology;  Laterality: N/A;   ESOPHAGOGASTRODUODENOSCOPY N/A 07/08/2023   Procedure: ESOPHAGOGASTRODUODENOSCOPY (EGD);  Surgeon: Normie Becton., MD;  Location: [facility];  Service: Gastroenterology;  Laterality: N/A;   ESOPHAGOGASTRODUODENOSCOPY (EGD) WITH PROPOFOL  N/A 06/14/2023   Procedure: ESOPHAGOGASTRODUODENOSCOPY (EGD) WITH PROPOFOL ;  Surgeon: Kenney Peacemaker, MD;  Location: [facility];  Service: Gastroenterology;  Laterality: N/A;   EUS N/A 07/08/2023   Procedure: UPPER ENDOSCOPIC ULTRASOUND (EUS) LINEAR;  Surgeon: Normie Becton., MD;  Location: [facility];  Service: Gastroenterology;  Laterality: N/A;   FINE NEEDLE ASPIRATION N/A 07/08/2023   Procedure: FINE NEEDLE ASPIRATION (FNA) LINEAR;  Surgeon: Normie Becton., MD;  Location: [facility];  Service: Gastroenterology;  Laterality: N/A;   IR IMAGING GUIDED PORT INSERTION  07/29/2023   JOINT REPLACEMENT     Left hip total Dr. France Ina 10-01-17   MALONEY DILATION  06/14/2023   Procedure: Londa Rival DILATION;  Surgeon: Kenney Peacemaker, MD;  Location: [facility];  Service: Gastroenterology;;   MASTECTOMY Right 07/26/2019   PANCREATIC STENT PLACEMENT  06/14/2023   Procedure: PANCREATIC STENT PLACEMENT;  Surgeon: Kenney Peacemaker, MD;  Location: [facility];  Service: Gastroenterology;;   PANCREATIC STENT  PLACEMENT  06/27/2023   Procedure: PANCREATIC STENT PLACEMENT;  Surgeon: Lajuan Pila, MD;  Location: [facility];  Service: Gastroenterology;;   POLYPECTOMY     polypectomy-oropharynx     RE-EXCISION OF BREAST CANCER,SUPERIOR MARGINS Right 04/08/2019   Procedure: RE-EXCISION OF RIGHT BREAST ANTERIOR MARGINS;  Surgeon: Lillette Reid III, MD;  Location: [facility];  Service: General;  Laterality: Right;   right knee meniscus     SBO  10/2016   small bowel obstruction   SIMPLE MASTECTOMY WITH AXILLARY SENTINEL NODE BIOPSY Right 07/26/2019   Procedure: RIGHT MASTECTOMY WITH SENTINEL NODE BIOPSY;  Surgeon: Caralyn Chandler, MD;  Location: [facility];  Service: General;  Laterality: Right;   SPHINCTEROTOMY  06/27/2023   Procedure: SPHINCTEROTOMY;  Surgeon: Lajuan Pila, MD;  Location: [facility];  Service: Gastroenterology;;   [address]  07/08/2023   Procedure: [address];  Surgeon: Normie Becton., MD;  Location: [facility];  Service: Gastroenterology;;   TOTAL HIP ARTHROPLASTY Left 10/01/2017   Procedure: LEFT  TOTAL HIP ARTHROPLASTY ANTERIOR APPROACH;  Surgeon: Liliane Rei, MD;  Location: [facility];  Service: Orthopedics;  Laterality: Left;   TOTAL HIP ARTHROPLASTY Right 03/11/2018   Procedure: RIGHT TOTAL HIP ARTHROPLASTY ANTERIOR APPROACH;  Surgeon: Liliane Rei, MD;  Location: [facility];  Service: Orthopedics;  Laterality: Right;    UPPER GASTROINTESTINAL ENDOSCOPY      I have reviewed the social history and family history with the patient and they are unchanged from previous note.  ALLERGIES:  is allergic to lisinopril , oxaliplatin , statins, and atorvastatin .  MEDICATIONS:  Current Outpatient Medications  Medication Sig Dispense Refill   Cholecalciferol (VITAMIN D -3) 125 MCG (5000 UT) TABS Take 1 tablet by mouth daily.     famotidine  (PEPCID ) 20 MG tablet Take 1 tablet (20 mg total) by mouth 2 (two) times daily. (Patient taking differently: Take 20 mg by  mouth daily.) 30 tablet 1   fluticasone  (FLONASE ) 50 MCG/ACT nasal spray Place 2 sprays into both nostrils daily. 16 g 6   Glucose Blood (BLOOD GLUCOSE TEST STRIPS) STRP 1 each by In Vitro route in the morning, at noon, and at bedtime. May substitute to any manufacturer covered by patient's insurance. 100 strip 5   insulin  aspart (NOVOLOG ) 100 UNIT/ML FlexPen Inject 0-6 Units into the skin 3 (three) times daily with meals. Check Blood Glucose (BG) and inject per scale: BG <150= 0 unit; BG 150-200= 1 unit; BG 201-250= 2 unit; BG 251-300= 3 unit; BG 301-350= 4 unit; BG 351-400= 5 unit; BG >400= 6 unit and Call Primary Care. 15 mL 0   Insulin  Glargine (BASAGLAR  KWIKPEN) 100 UNIT/ML Inject 12 Units into the skin daily. May substitute as needed per insurance. (Patient taking differently: Inject 12 Units into the skin at bedtime.) 15 mL 2   Insulin  Pen  Needle (PEN NEEDLES) 31G X 5 MM MISC 1 each by Does not apply route 3 (three) times daily. May dispense any manufacturer covered by patient's insurance. 100 each 0   Lancets (ONETOUCH DELICA PLUS LANCET33G) MISC 1 each by Does not apply route 3 (three) times daily. 100 each 0   lidocaine -prilocaine  (EMLA ) cream Apply 1 Application topically as needed. 30 g 2   losartan  (COZAAR ) 100 MG tablet Take 1 tablet (100 mg total) by mouth daily. 90 tablet 1   NIFEdipine  (ADALAT  CC) 60 MG 24 hr tablet Take 1 tablet (60 mg total) by mouth daily. 90 tablet 1   ondansetron  (ZOFRAN ) 8 MG tablet Take 1 tablet (8 mg total) by mouth every 8 (eight) hours as needed for nausea or vomiting. 20 tablet 1   potassium chloride  (KLOR-CON ) 10 MEQ tablet Take 1 tablet (10 mEq total) by mouth 3 (three) times daily for 7 days, THEN 1 tablet (10 mEq total) daily for 23 days. 44 tablet 0   predniSONE  (STERAPRED UNI-PAK 21 TAB) 10 MG (21) TBPK tablet Use as directed. 21 each 0   prochlorperazine  (COMPAZINE ) 10 MG tablet Take 1 tablet (10 mg total) by mouth every 6 (six) hours as needed for  nausea or vomiting. 30 tablet 2   thyroid  (NP THYROID ) 90 MG tablet Take 1 tablet (90 mg total) by mouth daily. 30 tablet 5   No current facility-administered medications for this visit.    PHYSICAL EXAMINATION: ECOG PERFORMANCE STATUS: 1 - Symptomatic but completely ambulatory  Vitals:   11/25/23 1022 11/25/23 1023  BP: (!) 160/80 (!) 162/80  Pulse: 87   Resp: 18   Temp: 98.1 F (36.7 C)   SpO2: 99%    Wt Readings from Last 3 Encounters:  11/25/23 153 lb 1.6 oz (69.4 kg)  11/11/23 154 lb 14.4 oz (70.3 kg)  10/29/23 155 lb (70.3 kg)     GENERAL:alert, no distress and comfortable SKIN: skin color, texture, turgor are normal, no rashes or significant lesions EYES: normal, Conjunctiva are pink and non-injected, sclera clear NECK: supple, thyroid  normal size, non-tender, without nodularity LYMPH:  no palpable lymphadenopathy in the cervical, axillary  LUNGS: clear to auscultation and percussion with normal breathing effort HEART: regular rate & rhythm and no murmurs and no lower extremity edema except moderate edema of left ankle with tenderness  ABDOMEN:abdomen soft, non-tender and normal bowel sounds Musculoskeletal:no cyanosis of digits and no clubbing  NEURO: alert & oriented x 3 with fluent speech, no focal motor/sensory deficits  Physical Exam    LABORATORY DATA:  I have reviewed the data as listed    Latest Ref Rng & Units 11/25/2023    9:59 AM 11/11/2023    8:48 AM 10/28/2023    8:41 AM  CBC  WBC 4.0 - 10.5 K/uL 26.6  22.9  26.0   Hemoglobin 12.0 - 15.0 g/dL 9.6  7.6  8.2   Hematocrit 36.0 - 46.0 % 29.1  22.6  24.6   Platelets 150 - 400 K/uL 133  182  181         Latest Ref Rng & Units 11/25/2023    9:59 AM 11/11/2023    8:48 AM 10/28/2023    8:41 AM  CMP  Glucose 70 - 99 mg/dL 829  562  130   BUN 8 - 23 mg/dL 9  11  12    Creatinine 0.44 - 1.00 mg/dL 8.65  7.84  6.96   Sodium 135 - 145 mmol/L 145  141  142   Potassium 3.5 - 5.1 mmol/L 3.2  3.4  3.0    Chloride 98 - 111 mmol/L 109  108  108   CO2 22 - 32 mmol/L 26  24  26    Calcium  8.9 - 10.3 mg/dL 7.3  7.3  7.6   Total Protein 6.5 - 8.1 g/dL 6.7  6.1  6.2   Total Bilirubin 0.0 - 1.2 mg/dL 0.3  0.3  0.3   Alkaline Phos 38 - 126 U/L 247  245  305   AST 15 - 41 U/L 26  25  21    ALT 0 - 44 U/L 24  26  21        RADIOGRAPHIC STUDIES: I have personally reviewed the radiological images as listed and agreed with the findings in the report. No results found.    Orders Placed This Encounter  Procedures   CBC with Differential (Cancer Center Only)    Standing Status:   Future    Expected Date:   12/23/2023    Expiration Date:   12/22/2024   CMP (Cancer Center only)    Standing Status:   Future    Expected Date:   12/23/2023    Expiration Date:   12/22/2024   CBC with Differential (Cancer Center Only)    Standing Status:   Future    Expected Date:   12/30/2023    Expiration Date:   12/29/2024   CMP (Cancer Center only)    Standing Status:   Future    Expected Date:   12/30/2023    Expiration Date:   12/29/2024   All questions were answered. The patient knows to call the clinic with any problems, questions or concerns. No barriers to learning was detected. The total time spent in the appointment was 25 minutes, including review of chart and various tests results, discussions about plan of care and coordination of care plan     Sonja Isola, MD 11/25/2023

## 2023-11-25 NOTE — Telephone Encounter (Signed)
 Patient scheduled , mychart message sent to covering provider

## 2023-11-25 NOTE — Telephone Encounter (Signed)
 Copied from CRM [id number]. Topic: Clinical - Red Word Triage >> Nov 25, 2023 11:35 AM [place] B wrote: [facility] that prompted transfer to Nurse Triage: pt's daughter, [name] called in, pt has gout in left toe and is painful   Chief Complaint: Left great toe pain Symptoms: great toe is red and swollen Frequency: constant Pertinent Negatives: Patient denies fever Disposition: [] ED /[] Urgent Care (no appt availability in office) / [] Appointment(In office/virtual)/ []  [place] [place] [facility]/ [] Home Care/ [] Refused Recommended Disposition /[] [place] [facility]/ []  Follow-up with PCP Additional Notes: Per daughter unable to do chemo today due to pain. Patient was told by oncologist that it appears to be gout and that she needs to f/u with pcp. No appt avail until 6/5. RN advising urgent or emergency department. Daughter says she is unsure if patient will go to either. RN placed call to [facility] to see if there was some availability and was advised to send message to clinic. Went ahead scheduled 6/5 appt. Will send message [facility] to see if medication can be ordered ahead of time.  Reason for Disposition  [1] SEVERE pain (e.g., excruciating, unable to do any normal activities) AND [2] not improved after 2 hours of pain medicine  Answer Assessment - Initial Assessment Questions 1. ONSET: "When did the pain start?"      Pain started today  2. LOCATION: "Where is the pain located?"   (e.g., around nail, entire toe, at foot joint)      Left big toe  3. PAIN: "How bad is the pain?"    (Scale 1-10; or mild, moderate, severe)   -  MILD (1-3): doesn't interfere with normal activities    -  MODERATE (4-7): interferes with normal activities (e.g., work or school) or awakens from sleep, limping    -  SEVERE (8-10): excruciating pain, unable to do any normal activities, unable to walk     Severe pain  4. APPEARANCE: "What does the toe look like?" (e.g., redness, swelling, bruising, pallor)     Redness and  swellling  5. CAUSE: "What do you think is causing the toe pain?"     Believe it is gout  6. OTHER SYMPTOMS: "Do you have any other symptoms?" (e.g., leg pain, rash, fever, numbness)     No  7. PREGNANCY: "Is there any chance you are pregnant?" "When was your last menstrual period?"     No  Protocols used: Toe Pain-A-AH

## 2023-11-26 ENCOUNTER — Other Ambulatory Visit: Payer: Self-pay | Admitting: Internal Medicine

## 2023-11-27 ENCOUNTER — Ambulatory Visit: Payer: Self-pay

## 2023-11-27 DIAGNOSIS — M19072 Primary osteoarthritis, left ankle and foot: Secondary | ICD-10-CM | POA: Diagnosis not present

## 2023-11-27 DIAGNOSIS — M79672 Pain in left foot: Secondary | ICD-10-CM | POA: Diagnosis not present

## 2023-11-27 DIAGNOSIS — M8580 Other specified disorders of bone density and structure, unspecified site: Secondary | ICD-10-CM | POA: Diagnosis not present

## 2023-11-27 DIAGNOSIS — M109 Gout, unspecified: Secondary | ICD-10-CM | POA: Diagnosis not present

## 2023-11-27 DIAGNOSIS — M7989 Other specified soft tissue disorders: Secondary | ICD-10-CM | POA: Diagnosis not present

## 2023-12-01 NOTE — Progress Notes (Unsigned)
 [facility]   Telephone:([phone] [phone]    Patient Care Team: [name], MD as PCP - General (Internal Medicine) Eilleen Grates, MD as PCP - Cardiology (Cardiology) Alane Hsu, RN as Oncology Nurse Navigator Auther Bo, RN as Oncology Nurse Navigator Caralyn Chandler, MD as Consulting Physician (General Surgery) Sonja Galloway, MD as Consulting Physician (Hematology) Colie Dawes, MD as Attending Physician (Radiation Oncology) Myrle Aspen, [facility] (Inactive) as Pharmacist (Pharmacist)   CHIEF COMPLAINT: Follow up pancreatic cancer   Oncology History  Ductal carcinoma in situ (DCIS) of right breast  01/28/2019 Mammogram   Diagnostic Mammogram 01/28/19  IMPRESSION: Right breast retroareolar 7 mm grouped pleomorphic calcifications are suspicious.   02/02/2019 Initial Biopsy   Diagnosis 02/02/19 Breast, right, needle core biopsy, outer retroareolar - DUCTAL CARCINOMA IN SITU WITH CALCIFICATIONS. Microscopic Comment The ductal carcinoma in situ is intermediate grade. Estrogen and progesterone receptors will be performed.  Results: IMMUNOHISTOCHEMICAL AND MORPHOMETRIC ANALYSIS PERFORMED MANUALLY Estrogen Receptor: 90%, POSITIVE, STRONG STAINING INTENSITY Progesterone Receptor: 20%, POSITIVE, STRONG STAINING INTENSITY    02/02/2019 Cancer Staging   Staging form: Breast, AJCC 8th Edition - Clinical stage from 02/02/2019: Stage 0 (cTis (DCIS), cN0, cM0, G2, ER+, PR+, HER2: Not Assessed) - Signed by Sonja Iuka, MD on 02/10/2019   02/04/2019 Initial Diagnosis   Ductal carcinoma in situ (DCIS) of right breast   03/18/2019 Surgery   RIGHT BREAST LUMPECTOMY WITH RADIOACTIVE SEED LOCALIZATION by Dr. Alethea Andes 03/18/19   03/18/2019 Pathology Results   FINAL MICROSCOPIC DIAGNOSIS: 03/18/19 A. BREAST, RIGHT, LUMPECTOMY:  - Ductal carcinoma in situ with calcifications and necrosis, 1.4 cm.  - Ductal carcinoma in situ focally 0.1 cm from medial and  anterior  lumpectomy margins.  - Ductal carcinoma in situ involves the final anterior margin (See part  D).  - Ductal carcinoma in situ focally 0.3 cm from superior lumpectomy  margin.  - Biopsy site and biopsy clip.   B. BREAST, RIGHT LATERAL MARGIN, EXCISION:  - Benign breast tissue.  - No ductal carcinoma in situ.  - Final lateral margin greater than 1 cm.   C. BREAST, RIGHT MEDIAL MARGIN, EXCISION:  - Ductal carcinoma in situ, 0.3 cm.  - Ductal carcinoma in situ with focally 0.3 cm from final medial margin.   D. BREAST, RIGHT ANTERIOR MARGIN, EXCISION:  - Ductal carcinoma in situ, 1.5 cm.  - Ductal carcinoma in situ focally involves final anterior margin.   E. BREAST, RIGHT DEEP MARGIN, EXCISION:  - Fibrocystic changes.  - No ductal carcinoma in situ.  - Final posterior margin greater than 0.7 cm.    04/08/2019 Surgery   RE-EXCISION OF RIGHT BREAST ANTERIOR MARGINS by Dr Alethea Andes 04/08/19   04/08/2019 Pathology Results    DIAGNOSIS: 04/08/19  A. BREAST, RIGHT, ANTERIOR SUPERIOR, EXCISION:  - Intermediate grade ductal carcinoma in situ with necrosis.  - In situ carcinoma is <1 mm (not on ink) from the new anterior superior  margin, multifocally.  - Intraductal papilloma.  - Fibrocystic change.   B. BREAST, RIGHT, ANTERIOR INFERIOR, EXCISION:  - Benign breast tissue with resection changes.   C. BREAST, RIGHT, MEDIAL, EXCISION:  - Benign breast tissue with resection changes.  - Fibroadenomatoid and fibrocystic changes.   05/06/2019 Mammogram   Right Mammogram 05/06/19 IMPRESSION: Suspicious finding with 2 groups of microcalcifications in the medial right breast. The anterior group measures 2 x 2 x 2 mm. The more posterior group measures 4  x 3 x 4 mm.   05/12/2019 Pathology Results   Diagnosis 05/12/19 1. Breast, right, needle core biopsy, medial - DUCTAL CARCINOMA IN SITU WITH CALCIFICATIONS. - SEE MICROSCOPIC DESCRIPTION. 2. Breast, right, needle core biopsy,  medial - DUCTAL CARCINOMA IN SITU WITH CALCIFICATIONS. - SEE MICROSCOPIC DESCRIPTION. Results: IMMUNOHISTOCHEMICAL AND MORPHOMETRIC ANALYSIS PERFORMED MANUALLY Estrogen Receptor: 100%, POSITIVE, STRONG STAINING INTENSITY Progesterone Receptor: 0%, NEGATIVE COMMENT: The negative hormone receptor study(ies) in this case has an internal positive control. Results: IMMUNOHISTOCHEMICAL AND MORPHOMETRIC ANALYSIS PERFORMED MANUALLY Estrogen Receptor: 100%, POSITIVE, STRONG STAINING INTENSITY Progesterone Receptor: 0%, NEGATIVE COMMENT: The negative hormone receptor study(ies) in this case has an internal positive control.   07/26/2019 Surgery   RIGHT MASTECTOMY WITH SENTINEL NODE BIOPSY by Dr. Alethea Andes 07/26/19   07/26/2019 Pathology Results   FINAL MICROSCOPIC DIAGNOSIS: 07/26/19  A. LYMPH NODE, RIGHT #1, SENTINEL,  BIOPSY:  -  No carcinoma identified in one lymph node (0/1)   B. BREAST, RIGHT, MASTECTOMY:  -  Ductal carcinoma in situ, intermediate grade, 0.6 cm  -  Margins uninvolved by carcinoma (greater than 1 cm; posterior margin)  -  Previous biopsy site changes (x2)  -  See oncology table below   07/26/2019 Cancer Staging   Staging form: Breast, AJCC 8th Edition - Pathologic stage from 07/26/2019: Stage 0 (pTis (DCIS), pN0, cM0, G2, ER+, PR+, HER2: Not Assessed) - Signed by Sonja Ferrelview, MD on 08/10/2019   08/15/2023 Genetic Testing   Negative [facility] CancerNext-Expanded +RNAinsight Panel.  VUS in ATM at c.4910-891G>A and SDHA at c.-1C>G.  Report date is 08/15/2023.   The CancerNext-Expanded gene panel offered by [facility] and includes sequencing, rearrangement, and RNA analysis for the following 76 genes: AIP, ALK, APC, ATM, AXIN2, BAP1, BARD1, BMPR1A, BRCA1, BRCA2, BRIP1, CDC73, CDH1, CDK4, CDKN1B, CDKN2A, CEBPA, CHEK2, CTNNA1, DDX41, DICER1, ETV6, FH, FLCN, GATA2, LZTR1, MAX, MBD4, MEN1, MET, MLH1, MSH2, MSH3, MSH6, MUTYH, NF1, NF2, NTHL1, PALB2, PHOX2B, PMS2, POT1, PRKAR1A, PTCH1, PTEN, RAD51C,  RAD51D, RB1, RET, RUNX1, SDHA, SDHAF2, SDHB, SDHC, SDHD, SMAD4, SMARCA4, SMARCB1, SMARCE1, STK11, SUFU, TMEM127, TP53, TSC1, TSC2, VHL, and WT1 (sequencing and deletion/duplication); EGFR, HOXB13, KIT, MITF, PDGFRA, POLD1, and POLE (sequencing only); EPCAM and GREM1 (deletion/duplication only).    Pancreatic adenocarcinoma (HCC)  07/07/2022 Cancer Staging   Staging form: Exocrine Pancreas, AJCC 8th Edition - Clinical stage from 07/07/2022: Stage IB (cT2, cN0, cM0) - Signed by Sonja Milan, MD on 07/22/2023 Stage prefix: Initial diagnosis Total positive nodes: 0   06/13/2023 Initial Diagnosis   Pancreatic adenocarcinoma (HCC)   08/06/2023 - 11/13/2023 Chemotherapy   Patient is on Treatment Plan : PANCREAS Modified FOLFIRINOX q14d x 4 cycles     08/15/2023 Genetic Testing   Negative [facility] CancerNext-Expanded +RNAinsight Panel.  VUS in ATM at c.4910-891G>A and SDHA at c.-1C>G.  Report date is 08/15/2023.   The CancerNext-Expanded gene panel offered by [facility] and includes sequencing, rearrangement, and RNA analysis for the following 76 genes: AIP, ALK, APC, ATM, AXIN2, BAP1, BARD1, BMPR1A, BRCA1, BRCA2, BRIP1, CDC73, CDH1, CDK4, CDKN1B, CDKN2A, CEBPA, CHEK2, CTNNA1, DDX41, DICER1, ETV6, FH, FLCN, GATA2, LZTR1, MAX, MBD4, MEN1, MET, MLH1, MSH2, MSH3, MSH6, MUTYH, NF1, NF2, NTHL1, PALB2, PHOX2B, PMS2, POT1, PRKAR1A, PTCH1, PTEN, RAD51C, RAD51D, RB1, RET, RUNX1, SDHA, SDHAF2, SDHB, SDHC, SDHD, SMAD4, SMARCA4, SMARCB1, SMARCE1, STK11, SUFU, TMEM127, TP53, TSC1, TSC2, VHL, and WT1 (sequencing and deletion/duplication); EGFR, HOXB13, KIT, MITF, PDGFRA, POLD1, and POLE (sequencing only); EPCAM and GREM1 (deletion/duplication only).    12/02/2023 -  Chemotherapy   Patient is on Treatment Plan : PANCREATIC Abraxane D1,8 + Gemcitabine D1,8 q21d        CURRENT THERAPY: FOLFIRINOX 2/25 - 10/2023, changed to Gemcitabine/Abraxane 12/02/23   INTERVAL HISTORY Ms. Walter returns for follow up and treatment. Gout  flare has resolved. She feels well, normal. Has had no side effects from chemo. Continues eating/drinking well and active. Has a cough every now and then she attributes to the weather changing. Denies pain, n/v/c/d, fever/chills, chest pain, dyspnea, leg edema, or neuropathy.  ROS  All other systems reviewed and negative  Past Medical History:  Diagnosis Date   Allergy    Ambulates with cane    straight cane   Arthritis    knee, back   CHF (congestive heart failure) (HCC)    Ductal carcinoma in situ of breast 12/2018   R Breast-mastectomy only   Gout    History of blood transfusion 1986   w/ Hysterectomy surgery   Hyperlipidemia    diet controlled - no meds   Hypertension    Hypothyroidism    Pelvic kidney    lower right pelvic kidney    SBO (small bowel obstruction) (HCC) 10/2016   surgery    Sleep apnea    Mild - no mask needed per sleep study   Smoker    quit smoking 2018   Type 2 diabetes mellitus (HCC)      Past Surgical History:  Procedure Laterality Date   ABDOMINAL HYSTERECTOMY  1986   COMPLETE-precancerous   APPENDECTOMY     pt states it was removed when gallbladder was removed.   AUGMENTATION MAMMAPLASTY     BACK SURGERY     lower back   BILIARY BRUSHING  07/08/2023   Procedure: BILIARY BRUSHING;  Surgeon: Brice Campi Albino Alu., MD;  Location: [facility];  Service: Gastroenterology;;   BILIARY STENT PLACEMENT N/A 07/08/2023   Procedure: BILIARY STENT PLACEMENT;  Surgeon: Normie Becton., MD;  Location: [facility];  Service: Gastroenterology;  Laterality: N/A;   BIOPSY  06/14/2023   Procedure: BIOPSY;  Surgeon: Kenney Peacemaker, MD;  Location: [facility];  Service: Gastroenterology;;   BIOPSY  07/08/2023   Procedure: BIOPSY;  Surgeon: Normie Becton., MD;  Location: [facility];  Service: Gastroenterology;;   BREAST BIOPSY Right 05/12/2019   times 2   BREAST LUMPECTOMY WITH RADIOACTIVE SEED LOCALIZATION Right 03/18/2019   Procedure:  RIGHT BREAST LUMPECTOMY WITH RADIOACTIVE SEED LOCALIZATION;  Surgeon: Caralyn Chandler, MD;  [facility];  Service: General;  Laterality: Right;   CHOLECYSTECTOMY     COLONOSCOPY  02/15/2008   [facility]    COLONOSCOPY WITH PROPOFOL   02/27/2018   Dr.Nandigam   ECTOPIC PREGNANCY SURGERY     ERCP N/A 06/14/2023   Procedure: ENDOSCOPIC RETROGRADE CHOLANGIOPANCREATOGRAPHY (ERCP);  Surgeon: Kenney Peacemaker, MD;  Location: [facility];  Service: Gastroenterology;  Laterality: N/A;   ERCP N/A 06/27/2023   Procedure: ENDOSCOPIC RETROGRADE CHOLANGIOPANCREATOGRAPHY (ERCP);  Surgeon: Lajuan Pila, MD;  Location: [facility];  Service: Gastroenterology;  Laterality: N/A;   ERCP N/A 07/08/2023   Procedure: ENDOSCOPIC RETROGRADE CHOLANGIOPANCREATOGRAPHY (ERCP);  Surgeon: Normie Becton., MD;  Location: [facility];  Service: Gastroenterology;  Laterality: N/A;   ESOPHAGOGASTRODUODENOSCOPY N/A 07/08/2023   Procedure: ESOPHAGOGASTRODUODENOSCOPY (EGD);  Surgeon: Normie Becton., MD;  Location: [facility];  Service: Gastroenterology;  Laterality: N/A;   ESOPHAGOGASTRODUODENOSCOPY (EGD) WITH PROPOFOL  N/A 06/14/2023   Procedure: ESOPHAGOGASTRODUODENOSCOPY (EGD) WITH PROPOFOL ;  Surgeon: Kenney Peacemaker, MD;  Location:  [facility];  Service: Gastroenterology;  Laterality: N/A;   EUS N/A 07/08/2023   Procedure: UPPER ENDOSCOPIC ULTRASOUND (EUS) LINEAR;  Surgeon: Normie Becton., MD;  Location: [facility];  Service: Gastroenterology;  Laterality: N/A;   FINE NEEDLE ASPIRATION N/A 07/08/2023   Procedure: FINE NEEDLE ASPIRATION (FNA) LINEAR;  Surgeon: Normie Becton., MD;  Location: [facility];  Service: Gastroenterology;  Laterality: N/A;   IR IMAGING GUIDED PORT INSERTION  07/29/2023   JOINT REPLACEMENT     Left hip total Dr. France Ina 10-01-17   MALONEY DILATION  06/14/2023   Procedure: Londa Rival DILATION;  Surgeon: Kenney Peacemaker, MD;  Location: [facility];  Service:  Gastroenterology;;   MASTECTOMY Right 07/26/2019   PANCREATIC STENT PLACEMENT  06/14/2023   Procedure: PANCREATIC STENT PLACEMENT;  Surgeon: Kenney Peacemaker, MD;  Location: [facility];  Service: Gastroenterology;;   PANCREATIC STENT PLACEMENT  06/27/2023   Procedure: PANCREATIC STENT PLACEMENT;  Surgeon: Lajuan Pila, MD;  Location: [facility];  Service: Gastroenterology;;   POLYPECTOMY     polypectomy-oropharynx     RE-EXCISION OF BREAST CANCER,SUPERIOR MARGINS Right 04/08/2019   Procedure: RE-EXCISION OF RIGHT BREAST ANTERIOR MARGINS;  Surgeon: Lillette Reid III, MD;  Location: [facility];  Service: General;  Laterality: Right;   right knee meniscus     SBO  10/2016   small bowel obstruction   SIMPLE MASTECTOMY WITH AXILLARY SENTINEL NODE BIOPSY Right 07/26/2019   Procedure: RIGHT MASTECTOMY WITH SENTINEL NODE BIOPSY;  Surgeon: Caralyn Chandler, MD;  Location: [facility];  Service: General;  Laterality: Right;   SPHINCTEROTOMY  06/27/2023   Procedure: SPHINCTEROTOMY;  Surgeon: Lajuan Pila, MD;  Location: [facility];  Service: Gastroenterology;;   [address]  07/08/2023   Procedure: [address];  Surgeon: Normie Becton., MD;  Location: [facility];  Service: Gastroenterology;;   TOTAL HIP ARTHROPLASTY Left 10/01/2017   Procedure: LEFT  TOTAL HIP ARTHROPLASTY ANTERIOR APPROACH;  Surgeon: Liliane Rei, MD;  Location: [facility];  Service: Orthopedics;  Laterality: Left;   TOTAL HIP ARTHROPLASTY Right 03/11/2018   Procedure: RIGHT TOTAL HIP ARTHROPLASTY ANTERIOR APPROACH;  Surgeon: Liliane Rei, MD;  Location: [facility];  Service: Orthopedics;  Laterality: Right;    UPPER GASTROINTESTINAL ENDOSCOPY       Outpatient Encounter Medications as of 12/02/2023  Medication Sig Note   Cholecalciferol (VITAMIN D -3) 125 MCG (5000 UT) TABS Take 1 tablet by mouth daily. 07/07/2023: When able to remember    famotidine  (PEPCID ) 20 MG tablet Take 1 tablet (20 mg total) by  mouth 2 (two) times daily. (Patient taking differently: Take 20 mg by mouth daily.)    fluticasone  (FLONASE ) 50 MCG/ACT nasal spray Place 2 sprays into both nostrils daily.    Glucose Blood (BLOOD GLUCOSE TEST STRIPS) STRP 1 each by In Vitro route in the morning, at noon, and at bedtime. May substitute to any manufacturer covered by patient's insurance.    insulin  aspart (NOVOLOG ) 100 UNIT/ML FlexPen Inject 0-6 Units into the skin 3 (three) times daily with meals. Check Blood Glucose (BG) and inject per scale: BG <150= 0 unit; BG 150-200= 1 unit; BG 201-250= 2 unit; BG 251-300= 3 unit; BG 301-350= 4 unit; BG 351-400= 5 unit; BG >400= 6 unit and Call Primary Care.    Insulin  Glargine (BASAGLAR  KWIKPEN) 100 UNIT/ML Inject 12 Units into the skin daily. May substitute as needed per insurance. (Patient taking differently: Inject 12 Units into the skin at bedtime.)    Lancets [facility]  PLUS LANCET33G) MISC 1 each by Does not apply route 3 (three) times daily.    lidocaine -prilocaine  (EMLA ) cream Apply 1 Application topically as needed. 07/29/2023: Hasn't started   losartan  (COZAAR ) 100 MG tablet Take 1 tablet (100 mg total) by mouth daily.    NIFEdipine  (ADALAT  CC) 60 MG 24 hr tablet Take 1 tablet (60 mg total) by mouth daily.    ondansetron  (ZOFRAN ) 8 MG tablet Take 1 tablet (8 mg total) by mouth every 8 (eight) hours as needed for nausea or vomiting. 07/29/2023: Hasn't started   predniSONE  (STERAPRED UNI-PAK 21 TAB) 10 MG (21) TBPK tablet Use as directed.    prochlorperazine  (COMPAZINE ) 10 MG tablet Take 1 tablet (10 mg total) by mouth every 6 (six) hours as needed for nausea or vomiting. 07/29/2023: Hasn't started   RELION PEN NEEDLES 31G X 6 MM MISC USE 1  THREE TIMES DAILY    thyroid  (NP THYROID ) 90 MG tablet Take 1 tablet (90 mg total) by mouth daily.    potassium chloride  (KLOR-CON ) 10 MEQ tablet Take 1 tablet (10 mEq total) by mouth 3 (three) times daily for 7 days, THEN 1 tablet (10 mEq total)  daily for 23 days.    No facility-administered encounter medications on file as of 12/02/2023.     Today's Vitals   12/02/23 1311  BP: 124/64  Pulse: 82  Resp: 17  Temp: 97.7 F (36.5 C)  TempSrc: Temporal  SpO2: 96%  Weight: 149 lb 3.2 oz (67.7 kg)  Height: 5\' 4"  (1.626 m)  PainSc: 0-No pain   Body mass index is 25.61 kg/m.   ECOG PERFORMANCE STATUS: 0 - Asymptomatic  PHYSICAL EXAM GENERAL:alert, no distress and comfortable SKIN: no rash  EYES: sclera clear NECK: without mass LYMPH:  no palpable cervical or supraclavicular lymphadenopathy  LUNGS: clear with normal breathing effort HEART: regular rate & rhythm, no lower extremity edema ABDOMEN: abdomen soft, non-tender and normal bowel sounds NEURO: alert & oriented x 3 with fluent speech, no focal motor/sensory deficits PAC without erythema    CBC    Latest Ref Rng & Units 12/02/2023   12:50 PM 11/25/2023    9:59 AM 11/11/2023    8:48 AM  CBC  WBC 4.0 - 10.5 K/uL 18.8  26.6  22.9   Hemoglobin 12.0 - 15.0 g/dL 16.1  9.6  7.6   Hematocrit 36.0 - 46.0 % 31.7  29.1  22.6   Platelets 150 - 400 K/uL 300  133  182       CMP     Latest Ref Rng & Units 12/02/2023   12:50 PM 11/25/2023    9:59 AM 11/11/2023    8:48 AM  CMP  Glucose 70 - 99 mg/dL 096  045  409   BUN 8 - 23 mg/dL 21  9  11    Creatinine 0.44 - 1.00 mg/dL 8.11  9.14  7.82   Sodium 135 - 145 mmol/L 144  145  141   Potassium 3.5 - 5.1 mmol/L 4.0  3.2  3.4   Chloride 98 - 111 mmol/L 107  109  108   CO2 22 - 32 mmol/L 29  26  24    Calcium  8.9 - 10.3 mg/dL 8.8  7.3  7.3   Total Protein 6.5 - 8.1 g/dL 6.8  6.7  6.1   Total Bilirubin 0.0 - 1.2 mg/dL 0.4  0.3  0.3   Alkaline Phos 38 - 126 U/L 196  247  245   AST  15 - 41 U/L 26  26  25    ALT 0 - 44 U/L 27  24  26        ASSESSMENT & PLAN:78 year old female     Pancreatic adenocarcinoma, cT2N0M0, stage IB -She presented with painless jaundice and intentional weight loss, workup showed pancreatic head mass  with double duct sign, concerning for malignancy -S/p pancreatic duct stenting, but failed bile duct stenting x 2 -Baseline CA 19-9 was normal -S/p ERCP and stent placement EUS with FNA by Dr. Brice Campi 07/08/23 which confirmed adenocarcinoma.  -Due to tumor invasion of portal vein and SMV, she is on neoadjuvant chemotherapy FOLFIRINOX starting 08/05/23  -Restaging CT 11/05/23 showed stable disease -Seen by Dr. Leighton Punches who recommends 6 months total neoadjuvant chemo, changed to second line Gem/Abraxane for additional 3 months -Ms. Hubka appears well. Gout flare resolved. She is otherwise doing well with very good PS. -Labs reviewed, adequate to proceed with C1 D1 Gem/Abraxane today as scheduled.  - F/up and C1 D8 next week, or sooner if needed. She knows to call if cough worsens or she develops fever/chills, SOB or other concerns   DCIS, right breast, stage 0, ER/PR+, G2 -Diagnosed 01/2019, s/p right lumpectomy on 03/18/2019, due to positive margin she underwent reexcision 04/08/2019 and mastectomy on 07/26/2019 -Final path showed DCIS, no invasive cancer, did not need postmastectomy radiation and declined adjuvant antiestrogen -Screening left mammogram 03/06/2023 was benign    PLAN: -Labs reviewed -Proceed with C1 D1 Gem/Abraxane -Cryotherapy during Abraxane, other SE's and symptom management reviewed -F/up and C1 D8 next week, or sooner if needed   All questions were answered. The patient knows to call the clinic with any problems, questions or concerns. No barriers to learning were detected.   Lauraine Crespo K Rawson [employer], NP 12/02/2023

## 2023-12-02 ENCOUNTER — Inpatient Hospital Stay

## 2023-12-02 ENCOUNTER — Encounter: Payer: Self-pay | Admitting: Nurse Practitioner

## 2023-12-02 ENCOUNTER — Inpatient Hospital Stay: Admitting: Nurse Practitioner

## 2023-12-02 VITALS — BP 124/64 | HR 82 | Temp 97.7°F | Resp 17 | Ht 64.0 in | Wt 149.2 lb

## 2023-12-02 DIAGNOSIS — C259 Malignant neoplasm of pancreas, unspecified: Secondary | ICD-10-CM

## 2023-12-02 DIAGNOSIS — Z5111 Encounter for antineoplastic chemotherapy: Secondary | ICD-10-CM | POA: Diagnosis not present

## 2023-12-02 DIAGNOSIS — T451X5A Adverse effect of antineoplastic and immunosuppressive drugs, initial encounter: Secondary | ICD-10-CM

## 2023-12-02 DIAGNOSIS — Z95828 Presence of other vascular implants and grafts: Secondary | ICD-10-CM

## 2023-12-02 DIAGNOSIS — K8689 Other specified diseases of pancreas: Secondary | ICD-10-CM

## 2023-12-02 DIAGNOSIS — C250 Malignant neoplasm of head of pancreas: Secondary | ICD-10-CM

## 2023-12-02 DIAGNOSIS — D0511 Intraductal carcinoma in situ of right breast: Secondary | ICD-10-CM

## 2023-12-02 LAB — CBC WITH DIFFERENTIAL (CANCER CENTER ONLY)
Abs Immature Granulocytes: 0.2 10*3/uL — ABNORMAL HIGH (ref 0.00–0.07)
Basophils Absolute: 0.1 10*3/uL (ref 0.0–0.1)
Basophils Relative: 0 %
Eosinophils Absolute: 0 10*3/uL (ref 0.0–0.5)
Eosinophils Relative: 0 %
HCT: 31.7 % — ABNORMAL LOW (ref 36.0–46.0)
Hemoglobin: 10.2 g/dL — ABNORMAL LOW (ref 12.0–15.0)
Immature Granulocytes: 1 %
Lymphocytes Relative: 15 %
Lymphs Abs: 2.9 10*3/uL (ref 0.7–4.0)
MCH: 30.3 pg (ref 26.0–34.0)
MCHC: 32.2 g/dL (ref 30.0–36.0)
MCV: 94.1 fL (ref 80.0–100.0)
Monocytes Absolute: 1.2 10*3/uL — ABNORMAL HIGH (ref 0.1–1.0)
Monocytes Relative: 6 %
Neutro Abs: 14.5 10*3/uL — ABNORMAL HIGH (ref 1.7–7.7)
Neutrophils Relative %: 78 %
Platelet Count: 300 10*3/uL (ref 150–400)
RBC: 3.37 MIL/uL — ABNORMAL LOW (ref 3.87–5.11)
RDW: 22.7 % — ABNORMAL HIGH (ref 11.5–15.5)
WBC Count: 18.8 10*3/uL — ABNORMAL HIGH (ref 4.0–10.5)
nRBC: 0.1 % (ref 0.0–0.2)

## 2023-12-02 LAB — CMP (CANCER CENTER ONLY)
ALT: 27 U/L (ref 0–44)
AST: 26 U/L (ref 15–41)
Albumin: 3.7 g/dL (ref 3.5–5.0)
Alkaline Phosphatase: 196 U/L — ABNORMAL HIGH (ref 38–126)
Anion gap: 8 (ref 5–15)
BUN: 21 mg/dL (ref 8–23)
CO2: 29 mmol/L (ref 22–32)
Calcium: 8.8 mg/dL — ABNORMAL LOW (ref 8.9–10.3)
Chloride: 107 mmol/L (ref 98–111)
Creatinine: 0.95 mg/dL (ref 0.44–1.00)
GFR, Estimated: >60 mL/min (ref >60)
Glucose, Bld: 125 mg/dL — ABNORMAL HIGH (ref 70–99)
Potassium: 4 mmol/L (ref 3.5–5.1)
Sodium: 144 mmol/L (ref 135–145)
Total Bilirubin: 0.4 mg/dL (ref 0.0–1.2)
Total Protein: 6.8 g/dL (ref 6.5–8.1)

## 2023-12-02 LAB — SAMPLE TO BLOOD BANK

## 2023-12-02 MED ORDER — SODIUM CHLORIDE 0.9% FLUSH
10.0000 mL | Freq: Once | INTRAVENOUS | Status: AC
  Administered 2023-12-02: 10 mL

## 2023-12-02 MED ORDER — SODIUM CHLORIDE 0.9% FLUSH
10.0000 mL | INTRAVENOUS | Status: DC | PRN
  Administered 2023-12-02: 10 mL

## 2023-12-02 MED ORDER — HEPARIN SOD (PORK) LOCK FLUSH 100 UNIT/ML IV SOLN
500.0000 [IU] | Freq: Once | INTRAVENOUS | Status: AC | PRN
  Administered 2023-12-02: 500 [IU]

## 2023-12-02 MED ORDER — PROCHLORPERAZINE MALEATE 10 MG PO TABS
10.0000 mg | ORAL_TABLET | Freq: Once | ORAL | Status: AC
  Administered 2023-12-02: 10 mg via ORAL
  Filled 2023-12-02: qty 1

## 2023-12-02 MED ORDER — SODIUM CHLORIDE 0.9 % IV SOLN: INTRAVENOUS | Status: DC

## 2023-12-02 MED ORDER — PACLITAXEL PROTEIN-BOUND CHEMO INJECTION 100 MG
125.0000 mg/m2 | Freq: Once | INTRAVENOUS | Status: AC
  Administered 2023-12-02: 225 mg via INTRAVENOUS
  Filled 2023-12-02: qty 45

## 2023-12-02 MED ORDER — SODIUM CHLORIDE 0.9 % IV SOLN
1000.0000 mg/m2 | Freq: Once | INTRAVENOUS | Status: AC
  Administered 2023-12-02: 1786 mg via INTRAVENOUS
  Filled 2023-12-02: qty 46.97

## 2023-12-02 NOTE — Patient Instructions (Signed)
 [facility] - A DEPT OF [facility]  Discharge Instructions: Thank you for choosing [facility] to provide your oncology and hematology care.   If you have a lab appointment with the Cancer Center, please go directly to the Cancer Center and check in at the registration area.   Wear comfortable clothing and clothing appropriate for easy access to any Portacath or PICC line.   We strive to give you quality time with your provider. You may need to reschedule your appointment if you arrive late (15 or more minutes).  Arriving late affects you and other patients whose appointments are after yours.  Also, if you miss three or more appointments without notifying the office, you may be dismissed from the clinic at the provider's discretion.      For prescription refill requests, have your pharmacy contact our office and allow 72 hours for refills to be completed.    Today you received the following chemotherapy and/or immunotherapy agents Abraxane and gemzar      To help prevent nausea and vomiting after your treatment, we encourage you to take your nausea medication as directed.  BELOW ARE SYMPTOMS THAT SHOULD BE REPORTED IMMEDIATELY: *FEVER GREATER THAN 100.4 F (38 C) OR HIGHER *CHILLS OR SWEATING *NAUSEA AND VOMITING THAT IS NOT CONTROLLED WITH YOUR NAUSEA MEDICATION *UNUSUAL SHORTNESS OF BREATH *UNUSUAL BRUISING OR BLEEDING *URINARY PROBLEMS (pain or burning when urinating, or frequent urination) *BOWEL PROBLEMS (unusual diarrhea, constipation, pain near the anus) TENDERNESS IN MOUTH AND THROAT WITH OR WITHOUT PRESENCE OF ULCERS (sore throat, sores in mouth, or a toothache) UNUSUAL RASH, SWELLING OR PAIN  UNUSUAL VAGINAL DISCHARGE OR ITCHING   Items with * indicate a potential emergency and should be followed up as soon as possible or go to the Emergency Department if any problems should occur.  Please show the CHEMOTHERAPY ALERT CARD or  IMMUNOTHERAPY ALERT CARD at check-in to the Emergency Department and triage nurse.  Should you have questions after your visit or need to cancel or reschedule your appointment, please contact [facility] - A DEPT OF [facility]  Dept: [phone]  and follow the prompts.  Office hours are 8:00 a.m. to 4:30 p.m. Monday - Friday. Please note that voicemails left after 4:00 p.m. may not be returned until the following business day.  We are closed weekends and major holidays. You have access to a nurse at all times for urgent questions. Please call the main number to the clinic Dept: [phone] and follow the prompts.   For any non-urgent questions, you may also contact your provider using MyChart. We now offer e-Visits for anyone 70 and older to request care online for non-urgent symptoms. For details visit [email or web address].   Also download the MyChart app! Go to the app store, search "MyChart", open the app, select [place], and log in with your MyChart username and password.

## 2023-12-08 NOTE — Assessment & Plan Note (Addendum)
 cT2N0M0, stage IB -She presented with painless jaundice and intentional weight loss, workup showed pancreatic head mass with double duct sign, concerning for malignancy -S/p pancreatic duct stenting, but failed bile duct stenting x 2 -Baseline CA 19-9 was normal -S/p ERCP and stent placement EUS with FNA by Dr. Brice Campi 07/08/23 which confirmed adenocarcinoma.  -Due to tumor invasion of portal vein and SMV, she is on neoadjuvant chemotherapy FOLFIRINOX starting 08/05/23  -Restaging CT 11/05/23 showed stable disease -Seen by Dr. Leighton Punches who recommends 6 months total neoadjuvant chemo, changed to second line Gem/Abraxane  for additional 3 months -Ms. Zagami appears well. Gout flare resolved. She is otherwise doing well with very good PS. -Labs reviewed, adequate to proceed with C1 D1 Gem/Abraxane  today as scheduled.  - 12/09/2023 -  C1 D8 today. Has recovered fully from recent gout flare of left great toe.

## 2023-12-08 NOTE — Progress Notes (Unsigned)
 Patient Care Team: [name], MD as PCP - General (Internal Medicine) Eilleen Grates, MD as PCP - Cardiology (Cardiology) Alane Hsu, RN as Oncology Nurse Navigator Auther Bo, RN as Oncology Nurse Navigator Caralyn Chandler, MD as Consulting Physician (General Surgery) Sonja Athens, MD as Consulting Physician (Hematology) Colie Dawes, MD as Attending Physician (Radiation Oncology) Myrle Aspen, [facility] (Inactive) as Pharmacist (Pharmacist)  Clinic Day:  12/09/2023  Referring physician: Sonja Naalehu, MD  ASSESSMENT & PLAN:   Assessment & Plan: Pancreatic adenocarcinoma (HCC) cT2N0M0, stage IB -She presented with painless jaundice and intentional weight loss, workup showed pancreatic head mass with double duct sign, concerning for malignancy -S/p pancreatic duct stenting, but failed bile duct stenting x 2 -Baseline CA 19-9 was normal -S/p ERCP and stent placement EUS with FNA by Dr. Brice Campi 07/08/23 which confirmed adenocarcinoma.  -Due to tumor invasion of portal vein and SMV, she is on neoadjuvant chemotherapy FOLFIRINOX starting 08/05/23  -Restaging CT 11/05/23 showed stable disease -Seen by Dr. Leighton Punches who recommends 6 months total neoadjuvant chemo, changed to second line Gem/Abraxane  for additional 3 months -Adrienne [name] appears well. Gout flare resolved. She is otherwise doing well with very good PS. -Labs reviewed, adequate to proceed with C1 D1 Gem/Abraxane  today as scheduled.  - 12/09/2023 -  C1 D8 today. Has recovered fully from recent gout flare of left great toe.    Gout flare Left great toe. Had to hold Cycle 1 day 8 gemzar /abraxane  to give her time to take gout medication and recover. Today, she states that her gout flare has resolved. She was treated with a prednisone  taper and diclofenac  to take as needed for pain and inflammation.   Anemia Like related to both cancer and chemotherapy. Has required blood transfusions in the past. Though Hgb and hct have  both decreased, blood not needed at this time. Will add ferritin and blood bank hold to her next labs and treatment.   Decreased renal function Mild reduction in renal functions with BUN 13, creatinine 1.05, and eGFR 47. This is most likely related to gout flare and medicinal treatment for this. Encouraged her to increase water  intake. Will recheck with every visit.   Plan: Labs reviewed. -moderate anemia with Hgb 8.6 and Hct 26.7. no requirement for blood transfusion today. Will check ferritin and send blood bank hold with next lab check.  -mild reduction in renal functions, most likely due to recent gout flare and treatment. Encouraged increased water  intake.  Labs and patient presentation are appropriate for chemotherapy gemzar /abraxane  today.  Proceed with Cycle 1 day 8 of gemcitabine /abraxane .  Labs/flush, follow up, and treatment as scheduled.    The patient understands the plans discussed today and is in agreement with them.  She knows to contact our office if she develops concerns prior to her next appointment.  I provided 25 minutes of face-to-face time during this encounter and > 50% was spent counseling as documented under my assessment and plan.    Sharyon Deis, NP  [facility] - A DEPT OF [facility] [address] [place] [address] Dept: [phone] Dept Fax: [phone]   Orders Placed This Encounter  Procedures   Ferritin    Standing Status:   Future    Expected Date:   12/16/2023    Expiration Date:   12/08/2024   Sample to Blood Bank(Blood Bank Hold)    Standing Status:  Future    Expected Date:   12/16/2023    Expiration Date:   12/08/2024      CHIEF COMPLAINT:  CC: Pancreatic adenocarcinoma history of ductal carcinoma of right breast  Current Treatment: FOLFIRINOX February 2025 through May 2025, changed to gemcitabine /Abraxane  on 12/02/2023  INTERVAL HISTORY:  Adrienne [name] is here today  for repeat clinical assessment.  She was last seen by Lacie, NP on 12/02/2023.  This was initial treatment with gemcitabine /Abraxane . Cycle 1 Day 8 had to be held for 1 week due to acute gout flare. She took prednisone  taper and diclofenac  as needed. She states that she is feeling much better.  She states that overall, she is feeling well.  She states her appetite is improving however, food just does not taste as good as it used to.  She denies nausea or vomiting.  Denies diarrhea or constipation.  She denies abdominal pain.  She denies presence of blood in her stool or hematemesis.  Denies epistaxis.  States she did have some mild neuropathy, especially in her left foot during gout flare.  This has resolved.  She denies fevers or chills. She denies pain.    I have reviewed the past medical history, past surgical history, social history and family history with the patient and they are unchanged from previous note.  ALLERGIES:  is allergic to lisinopril , oxaliplatin , statins, and atorvastatin .  MEDICATIONS:  Current Outpatient Medications  Medication Sig Dispense Refill   Cholecalciferol (VITAMIN D -3) 125 MCG (5000 UT) TABS Take 1 tablet by mouth daily.     famotidine  (PEPCID ) 20 MG tablet Take 1 tablet (20 mg total) by mouth 2 (two) times daily. (Patient taking differently: Take 20 mg by mouth daily.) 30 tablet 1   fluticasone  (FLONASE ) 50 MCG/ACT nasal spray Place 2 sprays into both nostrils daily. 16 g 6   Glucose Blood (BLOOD GLUCOSE TEST STRIPS) STRP 1 each by In Vitro route in the morning, at noon, and at bedtime. May substitute to any manufacturer covered by patient's insurance. 100 strip 5   insulin  aspart (NOVOLOG ) 100 UNIT/ML FlexPen Inject 0-6 Units into the skin 3 (three) times daily with meals. Check Blood Glucose (BG) and inject per scale: BG <150= 0 unit; BG 150-200= 1 unit; BG 201-250= 2 unit; BG 251-300= 3 unit; BG 301-350= 4 unit; BG 351-400= 5 unit; BG >400= 6 unit and Call Primary  Care. 15 mL 0   Insulin  Glargine (BASAGLAR  KWIKPEN) 100 UNIT/ML Inject 12 Units into the skin daily. May substitute as needed per insurance. (Patient taking differently: Inject 12 Units into the skin at bedtime.) 15 mL 2   Lancets (ONETOUCH DELICA PLUS LANCET33G) MISC 1 each by Does not apply route 3 (three) times daily. 100 each 0   lidocaine -prilocaine  (EMLA ) cream Apply 1 Application topically as needed. 30 g 2   losartan  (COZAAR ) 100 MG tablet Take 1 tablet (100 mg total) by mouth daily. 90 tablet 1   NIFEdipine  (ADALAT  CC) 60 MG 24 hr tablet Take 1 tablet (60 mg total) by mouth daily. 90 tablet 1   ondansetron  (ZOFRAN ) 8 MG tablet Take 1 tablet (8 mg total) by mouth every 8 (eight) hours as needed for nausea or vomiting. 20 tablet 1   potassium chloride  (KLOR-CON ) 10 MEQ tablet Take 1 tablet (10 mEq total) by mouth 3 (three) times daily for 7 days, THEN 1 tablet (10 mEq total) daily for 23 days. 44 tablet 0   predniSONE  (STERAPRED UNI-PAK 21 TAB) 10 MG (21)  TBPK tablet Use as directed. 21 each 0   prochlorperazine  (COMPAZINE ) 10 MG tablet Take 1 tablet (10 mg total) by mouth every 6 (six) hours as needed for nausea or vomiting. 30 tablet 2   RELION PEN NEEDLES 31G X 6 MM MISC USE 1  THREE TIMES DAILY 100 each 0   thyroid  (NP THYROID ) 90 MG tablet Take 1 tablet (90 mg total) by mouth daily. 30 tablet 5   No current facility-administered medications for this visit.   Facility-Administered Medications Ordered in Other Visits  Medication Dose Route Frequency Provider Last Rate Last Admin   0.9 %  sodium chloride  infusion   Intravenous Continuous Sonja Berlin, MD 10 mL/hr at 12/09/23 0914 New Bag at 12/09/23 0914   gemcitabine  (GEMZAR ) 1,786 mg in sodium chloride  0.9 % 250 mL chemo infusion  1,000 mg/m2 (Treatment Plan Recorded) Intravenous Once Sonja Busby, MD       PACLitaxel -protein bound (ABRAXANE ) chemo infusion 225 mg  125 mg/m2 (Treatment Plan Recorded) Intravenous Once Sonja Elverson, MD 90 mL/hr at  12/09/23 1001 225 mg at 12/09/23 1001    HISTORY OF PRESENT ILLNESS:   Oncology History  Ductal carcinoma in situ (DCIS) of right breast  01/28/2019 Mammogram   Diagnostic Mammogram 01/28/19  IMPRESSION: Right breast retroareolar 7 mm grouped pleomorphic calcifications are suspicious.   02/02/2019 Initial Biopsy   Diagnosis 02/02/19 Breast, right, needle core biopsy, outer retroareolar - DUCTAL CARCINOMA IN SITU WITH CALCIFICATIONS. Microscopic Comment The ductal carcinoma in situ is intermediate grade. Estrogen and progesterone receptors will be performed.  Results: IMMUNOHISTOCHEMICAL AND MORPHOMETRIC ANALYSIS PERFORMED MANUALLY Estrogen Receptor: 90%, POSITIVE, STRONG STAINING INTENSITY Progesterone Receptor: 20%, POSITIVE, STRONG STAINING INTENSITY    02/02/2019 Cancer Staging   Staging form: Breast, AJCC 8th Edition - Clinical stage from 02/02/2019: Stage 0 (cTis (DCIS), cN0, cM0, G2, ER+, PR+, HER2: Not Assessed) - Signed by Sonja Quintana, MD on 02/10/2019   02/04/2019 Initial Diagnosis   Ductal carcinoma in situ (DCIS) of right breast   03/18/2019 Surgery   RIGHT BREAST LUMPECTOMY WITH RADIOACTIVE SEED LOCALIZATION by Dr. Alethea Andes 03/18/19   03/18/2019 Pathology Results   FINAL MICROSCOPIC DIAGNOSIS: 03/18/19 A. BREAST, RIGHT, LUMPECTOMY:  - Ductal carcinoma in situ with calcifications and necrosis, 1.4 cm.  - Ductal carcinoma in situ focally 0.1 cm from medial and anterior  lumpectomy margins.  - Ductal carcinoma in situ involves the final anterior margin (See part  D).  - Ductal carcinoma in situ focally 0.3 cm from superior lumpectomy  margin.  - Biopsy site and biopsy clip.   B. BREAST, RIGHT LATERAL MARGIN, EXCISION:  - Benign breast tissue.  - No ductal carcinoma in situ.  - Final lateral margin greater than 1 cm.   C. BREAST, RIGHT MEDIAL MARGIN, EXCISION:  - Ductal carcinoma in situ, 0.3 cm.  - Ductal carcinoma in situ with focally 0.3 cm from final medial margin.    D. BREAST, RIGHT ANTERIOR MARGIN, EXCISION:  - Ductal carcinoma in situ, 1.5 cm.  - Ductal carcinoma in situ focally involves final anterior margin.   E. BREAST, RIGHT DEEP MARGIN, EXCISION:  - Fibrocystic changes.  - No ductal carcinoma in situ.  - Final posterior margin greater than 0.7 cm.    04/08/2019 Surgery   RE-EXCISION OF RIGHT BREAST ANTERIOR MARGINS by Dr Alethea Andes 04/08/19   04/08/2019 Pathology Results    DIAGNOSIS: 04/08/19  A. BREAST, RIGHT, ANTERIOR SUPERIOR, EXCISION:  - Intermediate grade ductal carcinoma in situ with necrosis.  -  In situ carcinoma is <1 mm (not on ink) from the new anterior superior  margin, multifocally.  - Intraductal papilloma.  - Fibrocystic change.   B. BREAST, RIGHT, ANTERIOR INFERIOR, EXCISION:  - Benign breast tissue with resection changes.   C. BREAST, RIGHT, MEDIAL, EXCISION:  - Benign breast tissue with resection changes.  - Fibroadenomatoid and fibrocystic changes.   05/06/2019 Mammogram   Right Mammogram 05/06/19 IMPRESSION: Suspicious finding with 2 groups of microcalcifications in the medial right breast. The anterior group measures 2 x 2 x 2 mm. The more posterior group measures 4 x 3 x 4 mm.   05/12/2019 Pathology Results   Diagnosis 05/12/19 1. Breast, right, needle core biopsy, medial - DUCTAL CARCINOMA IN SITU WITH CALCIFICATIONS. - SEE MICROSCOPIC DESCRIPTION. 2. Breast, right, needle core biopsy, medial - DUCTAL CARCINOMA IN SITU WITH CALCIFICATIONS. - SEE MICROSCOPIC DESCRIPTION. Results: IMMUNOHISTOCHEMICAL AND MORPHOMETRIC ANALYSIS PERFORMED MANUALLY Estrogen Receptor: 100%, POSITIVE, STRONG STAINING INTENSITY Progesterone Receptor: 0%, NEGATIVE COMMENT: The negative hormone receptor study(ies) in this case has an internal positive control. Results: IMMUNOHISTOCHEMICAL AND MORPHOMETRIC ANALYSIS PERFORMED MANUALLY Estrogen Receptor: 100%, POSITIVE, STRONG STAINING INTENSITY Progesterone Receptor: 0%,  NEGATIVE COMMENT: The negative hormone receptor study(ies) in this case has an internal positive control.   07/26/2019 Surgery   RIGHT MASTECTOMY WITH SENTINEL NODE BIOPSY by Dr. Alethea Andes 07/26/19   07/26/2019 Pathology Results   FINAL MICROSCOPIC DIAGNOSIS: 07/26/19  A. LYMPH NODE, RIGHT #1, SENTINEL,  BIOPSY:  -  No carcinoma identified in one lymph node (0/1)   B. BREAST, RIGHT, MASTECTOMY:  -  Ductal carcinoma in situ, intermediate grade, 0.6 cm  -  Margins uninvolved by carcinoma (greater than 1 cm; posterior margin)  -  Previous biopsy site changes (x2)  -  See oncology table below   07/26/2019 Cancer Staging   Staging form: Breast, AJCC 8th Edition - Pathologic stage from 07/26/2019: Stage 0 (pTis (DCIS), pN0, cM0, G2, ER+, PR+, HER2: Not Assessed) - Signed by Sonja Brocket, MD on 08/10/2019   08/15/2023 Genetic Testing   Negative [facility] CancerNext-Expanded +RNAinsight Panel.  VUS in ATM at c.4910-891G>A and SDHA at c.-1C>G.  Report date is 08/15/2023.   The CancerNext-Expanded gene panel offered by [facility] and includes sequencing, rearrangement, and RNA analysis for the following 76 genes: AIP, ALK, APC, ATM, AXIN2, BAP1, BARD1, BMPR1A, BRCA1, BRCA2, BRIP1, CDC73, CDH1, CDK4, CDKN1B, CDKN2A, CEBPA, CHEK2, CTNNA1, DDX41, DICER1, ETV6, FH, FLCN, GATA2, LZTR1, MAX, MBD4, MEN1, MET, MLH1, MSH2, MSH3, MSH6, MUTYH, NF1, NF2, NTHL1, PALB2, PHOX2B, PMS2, POT1, PRKAR1A, PTCH1, PTEN, RAD51C, RAD51D, RB1, RET, RUNX1, SDHA, SDHAF2, SDHB, SDHC, SDHD, SMAD4, SMARCA4, SMARCB1, SMARCE1, STK11, SUFU, TMEM127, TP53, TSC1, TSC2, VHL, and WT1 (sequencing and deletion/duplication); EGFR, HOXB13, KIT, MITF, PDGFRA, POLD1, and POLE (sequencing only); EPCAM and GREM1 (deletion/duplication only).    Pancreatic adenocarcinoma (HCC)  07/07/2022 Cancer Staging   Staging form: Exocrine Pancreas, AJCC 8th Edition - Clinical stage from 07/07/2022: Stage IB (cT2, cN0, cM0) - Signed by Sonja Windsor, MD on 07/22/2023 Stage prefix:  Initial diagnosis Total positive nodes: 0   06/13/2023 Initial Diagnosis   Pancreatic adenocarcinoma (HCC)   08/06/2023 - 11/13/2023 Chemotherapy   Patient is on Treatment Plan : PANCREAS Modified FOLFIRINOX q14d x 4 cycles     08/15/2023 Genetic Testing   Negative [facility] CancerNext-Expanded +RNAinsight Panel.  VUS in ATM at c.4910-891G>A and SDHA at c.-1C>G.  Report date is 08/15/2023.   The CancerNext-Expanded gene panel offered by [facility] and includes sequencing, rearrangement,  and RNA analysis for the following 76 genes: AIP, ALK, APC, ATM, AXIN2, BAP1, BARD1, BMPR1A, BRCA1, BRCA2, BRIP1, CDC73, CDH1, CDK4, CDKN1B, CDKN2A, CEBPA, CHEK2, CTNNA1, DDX41, DICER1, ETV6, FH, FLCN, GATA2, LZTR1, MAX, MBD4, MEN1, MET, MLH1, MSH2, MSH3, MSH6, MUTYH, NF1, NF2, NTHL1, PALB2, PHOX2B, PMS2, POT1, PRKAR1A, PTCH1, PTEN, RAD51C, RAD51D, RB1, RET, RUNX1, SDHA, SDHAF2, SDHB, SDHC, SDHD, SMAD4, SMARCA4, SMARCB1, SMARCE1, STK11, SUFU, TMEM127, TP53, TSC1, TSC2, VHL, and WT1 (sequencing and deletion/duplication); EGFR, HOXB13, KIT, MITF, PDGFRA, POLD1, and POLE (sequencing only); EPCAM and GREM1 (deletion/duplication only).    12/02/2023 -  Chemotherapy   Patient is on Treatment Plan : PANCREATIC Abraxane  D1,8 + Gemcitabine  D1,8 q21d         REVIEW OF SYSTEMS:   Constitutional: Denies fevers, chills or abnormal weight loss Eyes: Denies blurriness of vision Ears, nose, mouth, throat, and face: Denies mucositis or sore throat Respiratory: Denies cough, dyspnea or wheezes Cardiovascular: Denies palpitation, chest discomfort or lower extremity swelling Gastrointestinal:  Denies nausea, heartburn or change in bowel habits Skin: Denies abnormal skin rashes Lymphatics: Denies new lymphadenopathy or easy bruising Neurological:Denies numbness, tingling or new weaknesses Behavioral/Psych: Mood is stable, no new changes  All other systems were reviewed with the patient and are negative.   VITALS:   Today's  Vitals   12/09/23 0842 12/09/23 0843  BP: 138/72   Pulse: 72   Resp: 18   Temp: (!) 97.2 F (36.2 C)   TempSrc: Temporal   SpO2: 99%   Weight: 149 lb (67.6 kg)   Height: 5' 4 (1.626 m)   PainSc:  0-No pain   Body mass index is 25.58 kg/m.   Wt Readings from Last 3 Encounters:  12/09/23 149 lb (67.6 kg)  12/02/23 149 lb 3.2 oz (67.7 kg)  11/25/23 153 lb 1.6 oz (69.4 kg)    Body mass index is 25.58 kg/m.  Performance status (ECOG): 1 - Symptomatic but completely ambulatory  PHYSICAL EXAM:   GENERAL:alert, no distress and comfortable SKIN: skin color, texture, turgor are normal, no rashes or significant lesions EYES: normal, Conjunctiva are pink and non-injected, sclera clear OROPHARYNX:no exudate, no erythema and lips, buccal mucosa, and tongue normal  NECK: supple, thyroid  normal size, non-tender, without nodularity LYMPH:  no palpable lymphadenopathy in the cervical, axillary or inguinal LUNGS: clear to auscultation and percussion with normal breathing effort HEART: regular rate & rhythm and no murmurs and no lower extremity edema ABDOMEN:abdomen soft, non-tender and normal bowel sounds Musculoskeletal:no cyanosis of digits and no clubbing  NEURO: alert & oriented x 3 with fluent speech, no focal motor/sensory deficits  LABORATORY DATA:  I have reviewed the data as listed    Component Value Date/Time   NA 140 12/09/2023 0800   NA 142 06/24/2023 0859   K 4.2 12/09/2023 0800   CL 107 12/09/2023 0800   CO2 26 12/09/2023 0800   GLUCOSE 140 (H) 12/09/2023 0800   BUN 13 12/09/2023 0800   BUN 19 06/24/2023 0859   CREATININE 1.15 (H) 12/09/2023 0800   CREATININE 0.95 12/02/2023 1250   CREATININE 1.05 (H) 12/05/2020 1027   CALCIUM  8.6 (L) 12/09/2023 0800   PROT 6.7 12/09/2023 0800   PROT 7.4 06/24/2023 0859   ALBUMIN 3.6 12/09/2023 0800   ALBUMIN 4.2 06/24/2023 0859   AST 23 12/09/2023 0800   AST 26 12/02/2023 1250   ALT 16 12/09/2023 0800   ALT 27 12/02/2023  1250   ALKPHOS 116 12/09/2023 0800   BILITOT 0.4 12/09/2023 0800  BILITOT 0.4 12/02/2023 1250   GFRNONAA 49 (L) 12/09/2023 0800   GFRNONAA >60 12/02/2023 1250   GFRNONAA 67 08/29/2020 1048   GFRAA 78 08/29/2020 1048    Lab Results  Component Value Date   WBC 4.1 12/09/2023   NEUTROABS 2.1 12/09/2023   HGB 8.6 (L) 12/09/2023   HCT 26.7 (L) 12/09/2023   MCV 97.1 12/09/2023   PLT 173 12/09/2023

## 2023-12-09 ENCOUNTER — Inpatient Hospital Stay

## 2023-12-09 ENCOUNTER — Inpatient Hospital Stay: Admitting: Nurse Practitioner

## 2023-12-09 ENCOUNTER — Encounter: Payer: Self-pay | Admitting: Nurse Practitioner

## 2023-12-09 VITALS — BP 138/72 | HR 72 | Temp 97.2°F | Resp 18 | Ht 64.0 in | Wt 149.0 lb

## 2023-12-09 DIAGNOSIS — K8689 Other specified diseases of pancreas: Secondary | ICD-10-CM

## 2023-12-09 DIAGNOSIS — Z95828 Presence of other vascular implants and grafts: Secondary | ICD-10-CM

## 2023-12-09 DIAGNOSIS — C259 Malignant neoplasm of pancreas, unspecified: Secondary | ICD-10-CM

## 2023-12-09 DIAGNOSIS — Z5111 Encounter for antineoplastic chemotherapy: Secondary | ICD-10-CM | POA: Diagnosis not present

## 2023-12-09 LAB — CBC WITH DIFFERENTIAL/PLATELET
Abs Immature Granulocytes: 0.01 10*3/uL (ref 0.00–0.07)
Basophils Absolute: 0 10*3/uL (ref 0.0–0.1)
Basophils Relative: 1 %
Eosinophils Absolute: 0 10*3/uL (ref 0.0–0.5)
Eosinophils Relative: 1 %
HCT: 26.7 % — ABNORMAL LOW (ref 36.0–46.0)
Hemoglobin: 8.6 g/dL — ABNORMAL LOW (ref 12.0–15.0)
Immature Granulocytes: 0 %
Lymphocytes Relative: 40 %
Lymphs Abs: 1.7 10*3/uL (ref 0.7–4.0)
MCH: 31.3 pg (ref 26.0–34.0)
MCHC: 32.2 g/dL (ref 30.0–36.0)
MCV: 97.1 fL (ref 80.0–100.0)
Monocytes Absolute: 0.3 10*3/uL (ref 0.1–1.0)
Monocytes Relative: 8 %
Neutro Abs: 2.1 10*3/uL (ref 1.7–7.7)
Neutrophils Relative %: 50 %
Platelets: 173 10*3/uL (ref 150–400)
RBC: 2.75 MIL/uL — ABNORMAL LOW (ref 3.87–5.11)
RDW: 21.6 % — ABNORMAL HIGH (ref 11.5–15.5)
WBC: 4.1 10*3/uL (ref 4.0–10.5)
nRBC: 0 % (ref 0.0–0.2)

## 2023-12-09 LAB — COMPREHENSIVE METABOLIC PANEL WITH GFR
ALT: 16 U/L (ref 0–44)
AST: 23 U/L (ref 15–41)
Albumin: 3.6 g/dL (ref 3.5–5.0)
Alkaline Phosphatase: 116 U/L (ref 38–126)
Anion gap: 7 (ref 5–15)
BUN: 13 mg/dL (ref 8–23)
CO2: 26 mmol/L (ref 22–32)
Calcium: 8.6 mg/dL — ABNORMAL LOW (ref 8.9–10.3)
Chloride: 107 mmol/L (ref 98–111)
Creatinine, Ser: 1.15 mg/dL — ABNORMAL HIGH (ref 0.44–1.00)
GFR, Estimated: 49 mL/min — ABNORMAL LOW (ref >60)
Glucose, Bld: 140 mg/dL — ABNORMAL HIGH (ref 70–99)
Potassium: 4.2 mmol/L (ref 3.5–5.1)
Sodium: 140 mmol/L (ref 135–145)
Total Bilirubin: 0.4 mg/dL (ref 0.0–1.2)
Total Protein: 6.7 g/dL (ref 6.5–8.1)

## 2023-12-09 MED ORDER — PROCHLORPERAZINE MALEATE 10 MG PO TABS
10.0000 mg | ORAL_TABLET | Freq: Once | ORAL | Status: AC
  Administered 2023-12-09: 10 mg via ORAL

## 2023-12-09 MED ORDER — PACLITAXEL PROTEIN-BOUND CHEMO INJECTION 100 MG
125.0000 mg/m2 | Freq: Once | INTRAVENOUS | Status: AC
  Administered 2023-12-09: 225 mg via INTRAVENOUS
  Filled 2023-12-09: qty 45

## 2023-12-09 MED ORDER — SODIUM CHLORIDE 0.9% FLUSH
10.0000 mL | Freq: Once | INTRAVENOUS | Status: AC
  Administered 2023-12-09: 10 mL

## 2023-12-09 MED ORDER — SODIUM CHLORIDE 0.9 % IV SOLN
1000.0000 mg/m2 | Freq: Once | INTRAVENOUS | Status: AC
  Administered 2023-12-09: 1786 mg via INTRAVENOUS
  Filled 2023-12-09: qty 46.97

## 2023-12-09 MED ORDER — SODIUM CHLORIDE 0.9 % IV SOLN: INTRAVENOUS | Status: DC

## 2023-12-09 MED ORDER — HEPARIN SOD (PORK) LOCK FLUSH 100 UNIT/ML IV SOLN
250.0000 [IU] | Freq: Once | INTRAVENOUS | Status: DC
Start: 2023-12-09 — End: 2023-12-09

## 2023-12-09 NOTE — Patient Instructions (Signed)
 [facility] - A DEPT OF [facility]  Discharge Instructions: Thank you for choosing [facility] to provide your oncology and hematology care.   If you have a lab appointment with the Cancer Center, please go directly to the Cancer Center and check in at the registration area.   Wear comfortable clothing and clothing appropriate for easy access to any Portacath or PICC line.   We strive to give you quality time with your provider. You may need to reschedule your appointment if you arrive late (15 or more minutes).  Arriving late affects you and other patients whose appointments are after yours.  Also, if you miss three or more appointments without notifying the office, you may be dismissed from the clinic at the provider's discretion.      For prescription refill requests, have your pharmacy contact our office and allow 72 hours for refills to be completed.    Today you received the following chemotherapy and/or immunotherapy agents Abraxane and gemzar      To help prevent nausea and vomiting after your treatment, we encourage you to take your nausea medication as directed.  BELOW ARE SYMPTOMS THAT SHOULD BE REPORTED IMMEDIATELY: *FEVER GREATER THAN 100.4 F (38 C) OR HIGHER *CHILLS OR SWEATING *NAUSEA AND VOMITING THAT IS NOT CONTROLLED WITH YOUR NAUSEA MEDICATION *UNUSUAL SHORTNESS OF BREATH *UNUSUAL BRUISING OR BLEEDING *URINARY PROBLEMS (pain or burning when urinating, or frequent urination) *BOWEL PROBLEMS (unusual diarrhea, constipation, pain near the anus) TENDERNESS IN MOUTH AND THROAT WITH OR WITHOUT PRESENCE OF ULCERS (sore throat, sores in mouth, or a toothache) UNUSUAL RASH, SWELLING OR PAIN  UNUSUAL VAGINAL DISCHARGE OR ITCHING   Items with * indicate a potential emergency and should be followed up as soon as possible or go to the Emergency Department if any problems should occur.  Please show the CHEMOTHERAPY ALERT CARD or  IMMUNOTHERAPY ALERT CARD at check-in to the Emergency Department and triage nurse.  Should you have questions after your visit or need to cancel or reschedule your appointment, please contact [facility] - A DEPT OF [facility]  Dept: [phone]  and follow the prompts.  Office hours are 8:00 a.m. to 4:30 p.m. Monday - Friday. Please note that voicemails left after 4:00 p.m. may not be returned until the following business day.  We are closed weekends and major holidays. You have access to a nurse at all times for urgent questions. Please call the main number to the clinic Dept: [phone] and follow the prompts.   For any non-urgent questions, you may also contact your provider using MyChart. We now offer e-Visits for anyone 70 and older to request care online for non-urgent symptoms. For details visit [email or web address].   Also download the MyChart app! Go to the app store, search "MyChart", open the app, select [place], and log in with your MyChart username and password.

## 2023-12-15 ENCOUNTER — Encounter: Payer: Self-pay | Admitting: Hematology

## 2023-12-19 ENCOUNTER — Ambulatory Visit (INDEPENDENT_AMBULATORY_CARE_PROVIDER_SITE_OTHER): Admitting: Otolaryngology

## 2023-12-19 ENCOUNTER — Encounter (INDEPENDENT_AMBULATORY_CARE_PROVIDER_SITE_OTHER): Payer: Self-pay | Admitting: Otolaryngology

## 2023-12-19 VITALS — BP 155/69 | HR 60

## 2023-12-19 DIAGNOSIS — R0981 Nasal congestion: Secondary | ICD-10-CM

## 2023-12-19 DIAGNOSIS — R070 Pain in throat: Secondary | ICD-10-CM

## 2023-12-19 DIAGNOSIS — K219 Gastro-esophageal reflux disease without esophagitis: Secondary | ICD-10-CM | POA: Diagnosis not present

## 2023-12-19 DIAGNOSIS — J3089 Other allergic rhinitis: Secondary | ICD-10-CM

## 2023-12-19 DIAGNOSIS — R0982 Postnasal drip: Secondary | ICD-10-CM

## 2023-12-19 MED ORDER — FLUTICASONE PROPIONATE 50 MCG/ACT NA SUSP: 2.0000 | Freq: Every day | NASAL | 6 refills | Status: AC

## 2023-12-19 NOTE — Progress Notes (Signed)
 ENT Progress Note:   Update 12/19/2023  Discussed the use of AI scribe software for clinical note transcription with the patient, who gave verbal consent to proceed.  History of Present Illness Adrienne [name] is a 78 year old female undergoing chemotherapy who presents with a sore and scratchy throat.  She experiences a sore and scratchy throat every morning. A swallow test was planned but not performed due to the initiation of chemotherapy. She is currently undergoing chemotherapy with four treatments remaining and is unsure of the specific regimen. No significant illness from chemotherapy has been reported.  She has stopped taking Pepcid , which she was previously on. She experiences postnasal drainage sometimes in the morning but has not been using any treatment for it recently. She has a nasal spray like Flonase  available.  Chemotherapy has led to hair loss, which she does not find bothersome. It also affects her mouth, hands, and feet. She recently visited a dentist due to a chipped tooth, which may need a crown or extraction.  Records Reviewed:  Initial Evaluation  Reason for Consult: dysphagia, globus sensation    HPI: Discussed the use of AI scribe software for clinical note transcription with the patient, who gave verbal consent to proceed.  History of Present Illness   The patient is a 34 yoF hx of T2DM, ILD, emphysema, CHF, SNHL, presents with a chief complaint of difficulty swallowing, which they describe as a sensation of food only going down one side of their throat. This has been ongoing for approximately 12-13 months. The patient denies any associated coughing or choking when eating or drinking and reports no dietary restrictions due to the swallowing issue.  They have experienced instances where pills, particularly small fish oil capsules and metformin , have felt lodged in their throat, requiring additional effort to swallow. The patient has no history of stroke or  other major medical problems that could contribute to dysphagia. They have a history of breast cancer, for which they underwent a mastectomy but did not receive radiation or chemotherapy.  The patient also reports a sensation of excessive mucus in their throat, particularly in the mornings, and recent episodes of nasal congestion and sneezing. They have not previously used any medications for these symptoms. The patient has no history of neck or throat surgeries.    Records Reviewed:  Office note by Dr Karis 04/01/23 Plan: 1.  Otomicroscopy with bilateral cerumen disimpaction. 2.  The physical exam findings and the hearing test results are reviewed with the patient.  3.  The strategies to cope with tinnitus, including the use of masker, hearing aids, tinnitus retraining therapy, and avoidance of caffeine and alcohol are discussed.  4.  The patient is a candidate for hearing amplification.  The hearing aid options are discussed.  5.  The patient will return for reevaluation in 1 year.  PCP office note 02/13/23 - sent to Dr Karis for tininitus  Tinnitus of both ears       Has chronic tinnitus, advised to contact ENT specialist for further evaluation       Past Medical History:  Diagnosis Date   Allergy    Ambulates with cane    straight cane   Arthritis    knee, back   CHF (congestive heart failure) (HCC)    Ductal carcinoma in situ of breast 12/2018   R Breast-mastectomy only   Gout    History of blood transfusion 1986   w/ Hysterectomy surgery   Hyperlipidemia    diet  controlled - no meds   Hypertension    Hypothyroidism    Pelvic kidney    lower right pelvic kidney    SBO (small bowel obstruction) (HCC) 10/2016   surgery    Sleep apnea    Mild - no mask needed per sleep study   Smoker    quit smoking 2018   Type 2 diabetes mellitus (HCC)     Past Surgical History:  Procedure Laterality Date   ABDOMINAL HYSTERECTOMY  1986   COMPLETE-precancerous   APPENDECTOMY     pt  states it was removed when gallbladder was removed.   AUGMENTATION MAMMAPLASTY     BACK SURGERY     lower back   BILIARY BRUSHING  07/08/2023   Procedure: BILIARY BRUSHING;  Surgeon: Wilhelmenia Aloha Raddle., MD;  Location: [facility];  Service: Gastroenterology;;   BILIARY STENT PLACEMENT N/A 07/08/2023   Procedure: BILIARY STENT PLACEMENT;  Surgeon: Wilhelmenia Aloha Raddle., MD;  Location: [facility];  Service: Gastroenterology;  Laterality: N/A;   BIOPSY  06/14/2023   Procedure: BIOPSY;  Surgeon: Avram Lupita BRAVO, MD;  Location: [facility];  Service: Gastroenterology;;   BIOPSY  07/08/2023   Procedure: BIOPSY;  Surgeon: Wilhelmenia Aloha Raddle., MD;  Location: [facility];  Service: Gastroenterology;;   BREAST BIOPSY Right 05/12/2019   times 2   BREAST LUMPECTOMY WITH RADIOACTIVE SEED LOCALIZATION Right 03/18/2019   Procedure: RIGHT BREAST LUMPECTOMY WITH RADIOACTIVE SEED LOCALIZATION;  Surgeon: [name], MD;  Location: [facility];  Service: General;  Laterality: Right;   CHOLECYSTECTOMY     COLONOSCOPY  02/15/2008   [facility]    COLONOSCOPY WITH PROPOFOL   02/27/2018   Dr.Nandigam   ECTOPIC PREGNANCY SURGERY     ERCP N/A 06/14/2023   Procedure: ENDOSCOPIC RETROGRADE CHOLANGIOPANCREATOGRAPHY (ERCP);  Surgeon: Avram Lupita BRAVO, MD;  Location: [facility];  Service: Gastroenterology;  Laterality: N/A;   ERCP N/A 06/27/2023   Procedure: ENDOSCOPIC RETROGRADE CHOLANGIOPANCREATOGRAPHY (ERCP);  Surgeon: Charlanne Groom, MD;  Location: [facility];  Service: Gastroenterology;  Laterality: N/A;   ERCP N/A 07/08/2023   Procedure: ENDOSCOPIC RETROGRADE CHOLANGIOPANCREATOGRAPHY (ERCP);  Surgeon: Wilhelmenia Aloha Raddle., MD;  Location: [facility];  Service: Gastroenterology;  Laterality: N/A;   ESOPHAGOGASTRODUODENOSCOPY N/A 07/08/2023   Procedure: ESOPHAGOGASTRODUODENOSCOPY (EGD);  Surgeon: Wilhelmenia Aloha Raddle., MD;  Location: [facility];  Service: Gastroenterology;  Laterality: N/A;    ESOPHAGOGASTRODUODENOSCOPY (EGD) WITH PROPOFOL  N/A 06/14/2023   Procedure: ESOPHAGOGASTRODUODENOSCOPY (EGD) WITH PROPOFOL ;  Surgeon: Avram Lupita BRAVO, MD;  Location: [facility];  Service: Gastroenterology;  Laterality: N/A;   EUS N/A 07/08/2023   Procedure: UPPER ENDOSCOPIC ULTRASOUND (EUS) LINEAR;  Surgeon: Wilhelmenia Aloha Raddle., MD;  Location: [facility];  Service: Gastroenterology;  Laterality: N/A;   FINE NEEDLE ASPIRATION N/A 07/08/2023   Procedure: FINE NEEDLE ASPIRATION (FNA) LINEAR;  Surgeon: Wilhelmenia Aloha Raddle., MD;  Location: [facility];  Service: Gastroenterology;  Laterality: N/A;   IR IMAGING GUIDED PORT INSERTION  07/29/2023   JOINT REPLACEMENT     Left hip total Dr. Melodi 10-01-17   MALONEY DILATION  06/14/2023   Procedure: AGAPITO DILATION;  Surgeon: Avram Lupita BRAVO, MD;  Location: [facility];  Service: Gastroenterology;;   MASTECTOMY Right 07/26/2019   PANCREATIC STENT PLACEMENT  06/14/2023   Procedure: PANCREATIC STENT PLACEMENT;  Surgeon: Avram Lupita BRAVO, MD;  Location: [facility];  Service: Gastroenterology;;   PANCREATIC STENT PLACEMENT  06/27/2023   Procedure: PANCREATIC STENT PLACEMENT;  Surgeon: Charlanne Groom, MD;  Location: [facility];  Service: Gastroenterology;;  POLYPECTOMY     polypectomy-oropharynx     RE-EXCISION OF BREAST CANCER,SUPERIOR MARGINS Right 04/08/2019   Procedure: RE-EXCISION OF RIGHT BREAST ANTERIOR MARGINS;  Surgeon: Curvin Mt III, MD;  Location: [facility];  Service: General;  Laterality: Right;   right knee meniscus     SBO  10/2016   small bowel obstruction   SIMPLE MASTECTOMY WITH AXILLARY SENTINEL NODE BIOPSY Right 07/26/2019   Procedure: RIGHT MASTECTOMY WITH SENTINEL NODE BIOPSY;  Surgeon: Curvin Mt MOULD, MD;  Location: [facility];  Service: General;  Laterality: Right;   SPHINCTEROTOMY  06/27/2023   Procedure: SPHINCTEROTOMY;  Surgeon: Charlanne Groom, MD;  Location: [facility];  Service: Gastroenterology;;    ANNETT  07/08/2023   Procedure: ANNETT;  Surgeon: Wilhelmenia Aloha Raddle., MD;  Location: [facility];  Service: Gastroenterology;;   TOTAL HIP ARTHROPLASTY Left 10/01/2017   Procedure: LEFT  TOTAL HIP ARTHROPLASTY ANTERIOR APPROACH;  Surgeon: Melodi Lerner, MD;  Location: [facility];  Service: Orthopedics;  Laterality: Left;   TOTAL HIP ARTHROPLASTY Right 03/11/2018   Procedure: RIGHT TOTAL HIP ARTHROPLASTY ANTERIOR APPROACH;  Surgeon: Melodi Lerner, MD;  Location: [facility];  Service: Orthopedics;  Laterality: Right;    UPPER GASTROINTESTINAL ENDOSCOPY      Family History  Problem Relation Age of Onset   Cancer Sister    Diabetes Brother    Diabetes Brother    Hypertension Other    Breast cancer Sister    Colon cancer Neg Hx    Colon polyps Neg Hx    Rectal cancer Neg Hx    Stomach cancer Neg Hx     Social History:  reports that she quit smoking about 7 years ago. Her smoking use included e-cigarettes and cigarettes. She started smoking about 39 years ago. She has a 32 pack-year smoking history. She has never used smokeless tobacco. She reports that she does not drink alcohol and does not use drugs.  Allergies:  Allergies  Allergen Reactions   Lisinopril  Swelling    Angioedema   Oxaliplatin  Shortness Of Breath and Swelling   Statins     Myalgias   Atorvastatin  Other (See Comments)    Muscle aches     Medications: I have reviewed the patient's current medications.  The PMH, PSH, Medications, Allergies, and SH were reviewed and updated.  ROS: Constitutional: Negative for fever, weight loss and weight gain. Cardiovascular: Negative for chest pain and dyspnea on exertion. Respiratory: Is not experiencing shortness of breath at rest. Gastrointestinal: Negative for nausea and vomiting. Neurological: Negative for headaches. Psychiatric: The patient is not nervous/anxious  There were no vitals taken for this visit.  PHYSICAL EXAM:  Exam: General:  Well-developed, well-nourished Respiratory Respiratory effort: Equal inspiration and expiration without stridor Cardiovascular Peripheral Vascular: Warm extremities with equal color/perfusion Eyes: No nystagmus with equal extraocular motion bilaterally Neuro/Psych/Balance: Patient oriented to person, place, and time; Appropriate mood and affect; Gait is intact with no imbalance; Cranial nerves I-XII are intact Head and Face Inspection: Normocephalic and atraumatic without mass or lesion Palpation: Facial skeleton intact without bony stepoffs Salivary Glands: No mass or tenderness Facial Strength: Facial motility symmetric and full bilaterally ENT Pinna: External ear intact and fully developed External canal: Canal is patent with intact skin Tympanic Membrane: Clear and mobile External Nose: No scar or anatomic deformity Internal Nose: Septum is relatively straight. No polyp, or purulence. Mucosal edema and erythema present.  Bilateral inferior turbinate hypertrophy.  Lips, Teeth, and gums: Mucosa and teeth intact and  viable TMJ: No pain to palpation with full mobility Oral cavity/oropharynx: No erythema or exudate, no lesions present Nasopharynx: No mass or lesion with intact mucosa Hypopharynx: Intact mucosa without pooling of secretions Larynx Glottic: Full true vocal cord mobility without lesion or mass Supraglottic: Normal appearing epiglottis and AE folds Interarytenoid Space: Moderate post-cricoid edema/pachydermia Subglottic Space: No lesion/edema Neck Neck and Trachea: Midline trachea without mass or lesion Thyroid : No mass or nodularity Lymphatics: No lymphadenopathy  Procedure: Preoperative diagnosis: sore throat and discomfort   Postoperative diagnosis:   Same + GERD/LPR  Procedure: Flexible fiberoptic laryngoscopy  Surgeon: Elena Larry, MD  Anesthesia: Topical lidocaine  and Afrin Complications: None Condition is stable throughout exam  Indications and  consent:  The patient presents to the clinic with Indirect laryngoscopy view was incomplete. Thus it was recommended that they undergo a flexible fiberoptic laryngoscopy. All of the risks, benefits, and potential complications were reviewed with the patient preoperatively and verbal informed consent was obtained.  Procedure: The patient was seated upright in the clinic. Topical lidocaine  and Afrin were applied to the nasal cavity. After adequate anesthesia had occurred, I then proceeded to pass the flexible telescope into the nasal cavity. The nasal cavity was patent without rhinorrhea or polyp. The nasopharynx was also patent without mass or lesion. The base of tongue was visualized and was normal. There were no signs of pooling of secretions in the piriform sinuses. The true vocal folds were mobile bilaterally. There were no signs of glottic or supraglottic mucosal lesion or mass. There was moderate interarytenoid pachydermia and post cricoid edema. The telescope was then slowly withdrawn and the patient tolerated the procedure throughout.  Studies Reviewed:CT chest 09/05/22 1. Spectrum of findings compatible with mild basilar predominant fibrotic interstitial lung disease without frank honeycombing, without appreciable interval change from recent screening chest CT. Imaging differential includes mild NSIP or UIP. Findings are categorized as probable UIP per consensus guidelines: Diagnosis of Idiopathic Pulmonary Fibrosis: An Official ATS/ERS/JRS/ALAT Clinical Practice Guideline. Am JINNY [employer] Crit Care Med Vol 198, Iss 5, [phone], Feb 22 2017. 2. Stable mild cardiomegaly. Three-vessel coronary atherosclerosis. 3. Stable dilated main pulmonary artery, suggesting chronic pulmonary arterial hypertension. 4. Aortic Atherosclerosis (ICD10-I70.0) and Emphysema (ICD10-J43.9).   Assessment/Plan: No diagnosis found.   Assessment and Plan    Chronic Dysphagia with pills and sensation of numbness on  the left side of her throat.  Reports sensation of no swallowing on the left side of the throat for 12-13 months. No history of stroke or major risk factors. No choking or coughing when eating or drinking. Physical exam and scope exams were unremarkable. Differential diagnosis includes oropharyngeal vs esophageal dysphagia and untreated GERD LPR. Explained that a swallow study will evaluate the throat and esophagus. Discussed that untreated reflux can cause throat sensations like a lump or excessive mucus. If the swallow study is normal, no further action may be needed. Discussed potential benefits of trial of famotidine  for reflux and lifestyle modifications to manage symptoms. - Order swallow study - esophagram and MBS - Prescribe famotidine  20 mg BID - Provide education on lifestyle modifications to manage reflux (avoid late meals, reduce caffeine, fried foods, and spicy foods)  Chronic Nasal Congestion Reports nasal congestion, particularly after exposure to cold environments. No regular use of nasal sprays or allergy medications. Discussed potential benefits of Flonase  and Zyrtec  for managing symptoms. Recommended ocean spray or salt water  spray for nasal dryness, especially during colder months when heating can dry nasal passages. - Prescribe Flonase   nasal spray once daily 2 puffs b/l nares - Prescribe Zyrtec  10 mg at night - Recommend ocean spray or salt water  spray for nasal dryness  GERD LPR  and globus sensation  - Famotidine  20 mg BID  - diet and lifestyle changes to minimize reflux   SNHL b/l  - annual audiogram - trial of hearing aids when ready (currently cost prohibitive for her)   Update 12/19/2023 Assessment and Plan Assessment & Plan Persistent chronic sore throat  Persistent sore throat likely due to mouth breathing, postnasal drainage, or reflux irritation No abnormalities on scope examination but she did have changes c/w GERD LPR. We discussed silent reflux. We also  discussed that chemotherapy can impact levels of B12 and folate and that can lead to throat discomfort.  - Advise morning hydration. - Recommend morning salt water  gargle. - multivitamin   Postnasal drip and Environmental allergies  Postnasal drainage noted on scope exam.  - Refill Flonase  nasal spray. She would like to hold off on antihistamine and I think it is reasonable  GERD LPR Possible reflux contributing to sore throat, untreated. - Advise dietary modifications to reduce reflux, including limiting caffeine and early dinners.          Elena Larry, MD Otolaryngology [facility] Phone: [phone] Fax: [phone]    12/19/2023, 8:04 AM

## 2023-12-19 NOTE — Patient Instructions (Signed)
 [email or web address] - check out this website to learn more about reflux   -Avoid lying down for at least two hours after a meal or after drinking acidic beverages, like soda, or other caffeinated beverages. This can help to prevent stomach contents from flowing back into the esophagus. -Keep your head elevated while you sleep. Using an extra pillow or two can also help to prevent reflux. -Eat smaller and more frequent meals each day instead of a few large meals. This promotes digestion and can aid in preventing heartburn. -Wear loose-fitting clothes to ease pressure on the stomach, which can worsen heartburn and reflux. -Reduce excess weight around the midsection. This can ease pressure on the stomach. Such pressure can force some stomach contents back up the esophagus

## 2023-12-22 NOTE — Assessment & Plan Note (Signed)
 cT2N0M0, stage IB -Patient presented with obstructive jaundice, CT and MRI showed a 2.5 cm mass in the head of the pancreas, with pancreatic duct and biliary duct dilatation.  ERCP was attempted twice but failed due to deformed duodenum -She underwent ERCP and stent placement, EUS with fine-needle biopsy of the pancreatic mass by Dr. Wilhelmenia on July 08, 2023.  Biopsy confirmed adenocarcinoma. -Due to the tumor invasion to portal vein and SMV, I recommended neoadjuvant chemotherapy FOLFIRINOX, she started on 08/05/2023 -restaging CT 11/05/2023 showed stable disease  - Patient was seen by pancreatobiliary surgeon Dr. Dasie, who also consulted her colleagues at [facility], and they recommend a total of 6 months neoadjuvant chemotherapy.  Will change her chemo to second line gemcitabine  and Abraxane  for additional 3 months. She started on 12/02/2023

## 2023-12-23 ENCOUNTER — Other Ambulatory Visit: Payer: Self-pay

## 2023-12-23 ENCOUNTER — Inpatient Hospital Stay: Admitting: Hematology

## 2023-12-23 ENCOUNTER — Inpatient Hospital Stay: Attending: Nurse Practitioner

## 2023-12-23 ENCOUNTER — Inpatient Hospital Stay: Admitting: Nutrition

## 2023-12-23 ENCOUNTER — Inpatient Hospital Stay

## 2023-12-23 VITALS — BP 132/64 | HR 79 | Temp 97.2°F | Resp 15 | Ht 64.0 in | Wt 151.4 lb

## 2023-12-23 DIAGNOSIS — Z79631 Long term (current) use of antimetabolite agent: Secondary | ICD-10-CM | POA: Insufficient documentation

## 2023-12-23 DIAGNOSIS — C259 Malignant neoplasm of pancreas, unspecified: Secondary | ICD-10-CM

## 2023-12-23 DIAGNOSIS — D6481 Anemia due to antineoplastic chemotherapy: Secondary | ICD-10-CM | POA: Diagnosis not present

## 2023-12-23 DIAGNOSIS — Z95828 Presence of other vascular implants and grafts: Secondary | ICD-10-CM

## 2023-12-23 DIAGNOSIS — T451X5A Adverse effect of antineoplastic and immunosuppressive drugs, initial encounter: Secondary | ICD-10-CM | POA: Insufficient documentation

## 2023-12-23 DIAGNOSIS — C250 Malignant neoplasm of head of pancreas: Secondary | ICD-10-CM | POA: Diagnosis not present

## 2023-12-23 DIAGNOSIS — Z79633 Long term (current) use of mitotic inhibitor: Secondary | ICD-10-CM | POA: Diagnosis not present

## 2023-12-23 DIAGNOSIS — Z5111 Encounter for antineoplastic chemotherapy: Secondary | ICD-10-CM | POA: Diagnosis not present

## 2023-12-23 LAB — CMP (CANCER CENTER ONLY)
ALT: 13 U/L (ref 0–44)
AST: 19 U/L (ref 15–41)
Albumin: 3.4 g/dL — ABNORMAL LOW (ref 3.5–5.0)
Alkaline Phosphatase: 102 U/L (ref 38–126)
Anion gap: 7 (ref 5–15)
BUN: 17 mg/dL (ref 8–23)
CO2: 26 mmol/L (ref 22–32)
Calcium: 8.2 mg/dL — ABNORMAL LOW (ref 8.9–10.3)
Chloride: 107 mmol/L (ref 98–111)
Creatinine: 1.31 mg/dL — ABNORMAL HIGH (ref 0.44–1.00)
GFR, Estimated: 42 mL/min — ABNORMAL LOW (ref >60)
Glucose, Bld: 127 mg/dL — ABNORMAL HIGH (ref 70–99)
Potassium: 4.2 mmol/L (ref 3.5–5.1)
Sodium: 140 mmol/L (ref 135–145)
Total Bilirubin: 0.3 mg/dL (ref 0.0–1.2)
Total Protein: 6.5 g/dL (ref 6.5–8.1)

## 2023-12-23 LAB — CBC WITH DIFFERENTIAL (CANCER CENTER ONLY)
Abs Immature Granulocytes: 0.02 10*3/uL (ref 0.00–0.07)
Basophils Absolute: 0 10*3/uL (ref 0.0–0.1)
Basophils Relative: 0 %
Eosinophils Absolute: 0.1 10*3/uL (ref 0.0–0.5)
Eosinophils Relative: 1 %
HCT: 21.7 % — ABNORMAL LOW (ref 36.0–46.0)
Hemoglobin: 7.1 g/dL — ABNORMAL LOW (ref 12.0–15.0)
Immature Granulocytes: 0 %
Lymphocytes Relative: 31 %
Lymphs Abs: 1.7 10*3/uL (ref 0.7–4.0)
MCH: 32 pg (ref 26.0–34.0)
MCHC: 32.7 g/dL (ref 30.0–36.0)
MCV: 97.7 fL (ref 80.0–100.0)
Monocytes Absolute: 0.6 10*3/uL (ref 0.1–1.0)
Monocytes Relative: 11 %
Neutro Abs: 3.2 10*3/uL (ref 1.7–7.7)
Neutrophils Relative %: 57 %
Platelet Count: 239 10*3/uL (ref 150–400)
RBC: 2.22 MIL/uL — ABNORMAL LOW (ref 3.87–5.11)
RDW: 22.1 % — ABNORMAL HIGH (ref 11.5–15.5)
WBC Count: 5.6 10*3/uL (ref 4.0–10.5)
nRBC: 0 % (ref 0.0–0.2)

## 2023-12-23 LAB — PREPARE RBC (CROSSMATCH)

## 2023-12-23 LAB — FERRITIN: Ferritin: 840 ng/mL — ABNORMAL HIGH (ref 11–307)

## 2023-12-23 LAB — SAMPLE TO BLOOD BANK

## 2023-12-23 MED ORDER — SODIUM CHLORIDE 0.9 % IV SOLN: INTRAVENOUS | Status: DC

## 2023-12-23 MED ORDER — SODIUM CHLORIDE 0.9 % IV SOLN
1000.0000 mg/m2 | Freq: Once | INTRAVENOUS | Status: AC
  Administered 2023-12-23: 1786 mg via INTRAVENOUS
  Filled 2023-12-23: qty 46.97

## 2023-12-23 MED ORDER — PACLITAXEL PROTEIN-BOUND CHEMO INJECTION 100 MG
125.0000 mg/m2 | Freq: Once | INTRAVENOUS | Status: AC
  Administered 2023-12-23: 225 mg via INTRAVENOUS
  Filled 2023-12-23: qty 45

## 2023-12-23 MED ORDER — PROCHLORPERAZINE MALEATE 10 MG PO TABS
10.0000 mg | ORAL_TABLET | Freq: Once | ORAL | Status: AC
  Administered 2023-12-23: 10 mg via ORAL
  Filled 2023-12-23: qty 1

## 2023-12-23 MED ORDER — SODIUM CHLORIDE 0.9% FLUSH
10.0000 mL | Freq: Once | INTRAVENOUS | Status: AC
  Administered 2023-12-23: 10 mL

## 2023-12-23 MED ORDER — SODIUM CHLORIDE 0.9% FLUSH
10.0000 mL | INTRAVENOUS | Status: DC | PRN
  Administered 2023-12-23: 10 mL

## 2023-12-23 MED ORDER — HEPARIN SOD (PORK) LOCK FLUSH 100 UNIT/ML IV SOLN
500.0000 [IU] | Freq: Once | INTRAVENOUS | Status: AC | PRN
  Administered 2023-12-23: 500 [IU]

## 2023-12-23 NOTE — Progress Notes (Signed)
 Nutrition follow-up completed with patient during infusion for pancreas cancer.  Patient is receiving Abraxane  and gemcitabine .  She is followed by Dr. Lanny.  Weight was documented as 151 pounds 6.4 ounces July 1 149 pounds June 17 153 pounds 4.8 ounces April 22  Labs include glucose 127 and creatinine 1.31.  Patient denies difficulty chewing or swallowing.  She tolerates regular textures.  She denies nausea, vomiting, constipation, and diarrhea.  Reports morning blood sugars in the 70's most of the time.  Continues to complain of taste alterations.  Food just does not taste good.  It does not taste the same.She reports that she sometimes uses baking soda and salt water  gargle.  She likes most summer fruits and ice cream.  She does not eat much meat.  Her weight has been relatively stable.  Nutrition diagnosis: Food and nutrition related knowledge deficit, improved.  Intervention: Provided additional education on improving taste alterations.  Stressed importance of using baking soda and salt water  gargle prior to eating.  Provided multiple tips on adding seasonings and marinated meats.  Provided an additional fact sheet on taste and smell alterations.  Monitoring, evaluation, goals: Patient will work to increase calories and protein to minimize weight loss.  No follow-up is scheduled.  Patient has RD contact information for questions or concerns.  **Disclaimer: This note was dictated with voice recognition software. Similar sounding words can inadvertently be transcribed and this note may contain transcription errors which may not have been corrected upon publication of note.**

## 2023-12-23 NOTE — Patient Instructions (Signed)
 [facility] - A DEPT OF [facility]  Discharge Instructions: Thank you for choosing [facility] to provide your oncology and hematology care.   If you have a lab appointment with the Cancer Center, please go directly to the Cancer Center and check in at the registration area.   Wear comfortable clothing and clothing appropriate for easy access to any Portacath or PICC line.   We strive to give you quality time with your provider. You may need to reschedule your appointment if you arrive late (15 or more minutes).  Arriving late affects you and other patients whose appointments are after yours.  Also, if you miss three or more appointments without notifying the office, you may be dismissed from the clinic at the provider's discretion.      For prescription refill requests, have your pharmacy contact our office and allow 72 hours for refills to be completed.    Today you received the following chemotherapy and/or immunotherapy agents Abraxane  and Gemzar        To help prevent nausea and vomiting after your treatment, we encourage you to take your nausea medication as directed.  BELOW ARE SYMPTOMS THAT SHOULD BE REPORTED IMMEDIATELY: *FEVER GREATER THAN 100.4 F (38 C) OR HIGHER *CHILLS OR SWEATING *NAUSEA AND VOMITING THAT IS NOT CONTROLLED WITH YOUR NAUSEA MEDICATION *UNUSUAL SHORTNESS OF BREATH *UNUSUAL BRUISING OR BLEEDING *URINARY PROBLEMS (pain or burning when urinating, or frequent urination) *BOWEL PROBLEMS (unusual diarrhea, constipation, pain near the anus) TENDERNESS IN MOUTH AND THROAT WITH OR WITHOUT PRESENCE OF ULCERS (sore throat, sores in mouth, or a toothache) UNUSUAL RASH, SWELLING OR PAIN  UNUSUAL VAGINAL DISCHARGE OR ITCHING   Items with * indicate a potential emergency and should be followed up as soon as possible or go to the Emergency Department if any problems should occur.  Please show the CHEMOTHERAPY ALERT CARD or  IMMUNOTHERAPY ALERT CARD at check-in to the Emergency Department and triage nurse.  Should you have questions after your visit or need to cancel or reschedule your appointment, please contact [facility] - A DEPT OF [facility]  Dept: [phone]  and follow the prompts.  Office hours are 8:00 a.m. to 4:30 p.m. Monday - Friday. Please note that voicemails left after 4:00 p.m. may not be returned until the following business day.  We are closed weekends and major holidays. You have access to a nurse at all times for urgent questions. Please call the main number to the clinic Dept: [phone] and follow the prompts.   For any non-urgent questions, you may also contact your provider using MyChart. We now offer e-Visits for anyone 48 and older to request care online for non-urgent symptoms. For details visit [email or web address].   Also download the MyChart app! Go to the app store, search "MyChart", open the app, select [facility], and log in with your MyChart username and password.

## 2023-12-23 NOTE — Progress Notes (Signed)
 [facility]   [phone] [phone]   Clinic Follow up Note   Patient Care Team: [name], MD as PCP - General (Internal Medicine) [name], MD as PCP - Cardiology (Cardiology) [name], RN as Oncology Nurse Navigator Glean, Stephane BROCKS, RN (Inactive) as Oncology Nurse Navigator [name], MD as Consulting Physician (General Surgery) [name], MD as Consulting Physician (Hematology) [name], MD as Attending Physician (Radiation Oncology) Nicholaus Sherlean CROME, [facility] (Inactive) as Pharmacist (Pharmacist)  Date of Service:  12/23/2023  CHIEF COMPLAINT: f/u of pancreatic cancer  CURRENT THERAPY:  Gemcitabine  and Abraxane   Oncology History   Pancreatic adenocarcinoma (HCC) cT2N0M0, stage IB -Patient presented with obstructive jaundice, CT and MRI showed a 2.5 cm mass in the head of the pancreas, with pancreatic duct and biliary duct dilatation.  ERCP was attempted twice but failed due to deformed duodenum -She underwent ERCP and stent placement, EUS with fine-needle biopsy of the pancreatic mass by Dr. Wilhelmenia on July 08, 2023.  Biopsy confirmed adenocarcinoma. -Due to the tumor invasion to portal vein and SMV, I recommended neoadjuvant chemotherapy FOLFIRINOX, she started on 08/05/2023 -restaging CT 11/05/2023 showed stable disease  - Patient was seen by pancreatobiliary surgeon Dr. Dasie, who also consulted her colleagues at [facility], and they recommend a total of 6 months neoadjuvant chemotherapy.  Will change her chemo to second line gemcitabine  and Abraxane  for additional 3 months. She started on 12/02/2023  Assessment & Plan Pancreatic cancer Undergoing chemotherapy with a regimen of two weeks on treatment followed by a one-week break. Last scan in May led to treatment adjustment. Next scan in August to assess efficacy. Treatment is tolerable. - Administer chemotherapy today and next week, followed by a one-week break -  Schedule next scan for August to evaluate treatment response  Anemia due to chemotherapy Hemoglobin level is 7.1, indicating anemia likely due to chemotherapy-induced marrow suppression. Previous transfusion was approximately six weeks ago. No significant fatigue reported. - Arrange for blood transfusion in the next few days - Ensure type and crossmatch is completed for blood transfusion  Peripheral neuropathy due to chemotherapy Reports using ice therapy on fingers to manage neuropathy symptoms. Neuropathy in toes is present but not severe. No significant discomfort reported.  Dental issues Presence of a cracked tooth requiring extraction to prevent infection. Wisdom teeth removal is not urgent and should be postponed until after chemotherapy. - Schedule extraction of cracked tooth, ensuring at least a ten-day break from chemotherapy - Postpone wisdom teeth removal until after chemotherapy is completed  Gout Previous gout flare resolved with medication. No current symptoms reported.  Plan - Lab reviewed, adequate for treatment, will proceed cycle 2-day 1 gemcitabine  and Abraxane  today, she will return next week for day 8 treatment - Follow-up next week   SUMMARY OF ONCOLOGIC HISTORY: Oncology History  Ductal carcinoma in situ (DCIS) of right breast  01/28/2019 Mammogram   Diagnostic Mammogram 01/28/19  IMPRESSION: Right breast retroareolar 7 mm grouped pleomorphic calcifications are suspicious.   02/02/2019 Initial Biopsy   Diagnosis 02/02/19 Breast, right, needle core biopsy, outer retroareolar - DUCTAL CARCINOMA IN SITU WITH CALCIFICATIONS. Microscopic Comment The ductal carcinoma in situ is intermediate grade. Estrogen and progesterone receptors will be performed.  Results: IMMUNOHISTOCHEMICAL AND MORPHOMETRIC ANALYSIS PERFORMED MANUALLY Estrogen Receptor: 90%, POSITIVE, STRONG STAINING INTENSITY Progesterone Receptor: 20%, POSITIVE, STRONG STAINING INTENSITY    02/02/2019  Cancer Staging   Staging form: Breast, AJCC 8th Edition - Clinical stage from  02/02/2019: Stage 0 (cTis (DCIS), cN0, cM0, G2, ER+, PR+, HER2: Not Assessed) - Signed by [name], MD on 02/10/2019   02/04/2019 Initial Diagnosis   Ductal carcinoma in situ (DCIS) of right breast   03/18/2019 Surgery   RIGHT BREAST LUMPECTOMY WITH RADIOACTIVE SEED LOCALIZATION by Dr. Curvin 03/18/19   03/18/2019 Pathology Results   FINAL MICROSCOPIC DIAGNOSIS: 03/18/19 A. BREAST, RIGHT, LUMPECTOMY:  - Ductal carcinoma in situ with calcifications and necrosis, 1.4 cm.  - Ductal carcinoma in situ focally 0.1 cm from medial and anterior  lumpectomy margins.  - Ductal carcinoma in situ involves the final anterior margin (See part  D).  - Ductal carcinoma in situ focally 0.3 cm from superior lumpectomy  margin.  - Biopsy site and biopsy clip.   B. BREAST, RIGHT LATERAL MARGIN, EXCISION:  - Benign breast tissue.  - No ductal carcinoma in situ.  - Final lateral margin greater than 1 cm.   C. BREAST, RIGHT MEDIAL MARGIN, EXCISION:  - Ductal carcinoma in situ, 0.3 cm.  - Ductal carcinoma in situ with focally 0.3 cm from final medial margin.   D. BREAST, RIGHT ANTERIOR MARGIN, EXCISION:  - Ductal carcinoma in situ, 1.5 cm.  - Ductal carcinoma in situ focally involves final anterior margin.   E. BREAST, RIGHT DEEP MARGIN, EXCISION:  - Fibrocystic changes.  - No ductal carcinoma in situ.  - Final posterior margin greater than 0.7 cm.    04/08/2019 Surgery   RE-EXCISION OF RIGHT BREAST ANTERIOR MARGINS by Dr Curvin 04/08/19   04/08/2019 Pathology Results    DIAGNOSIS: 04/08/19  A. BREAST, RIGHT, ANTERIOR SUPERIOR, EXCISION:  - Intermediate grade ductal carcinoma in situ with necrosis.  - In situ carcinoma is <1 mm (not on ink) from the new anterior superior  margin, multifocally.  - Intraductal papilloma.  - Fibrocystic change.   B. BREAST, RIGHT, ANTERIOR INFERIOR, EXCISION:  - Benign breast tissue with  resection changes.   C. BREAST, RIGHT, MEDIAL, EXCISION:  - Benign breast tissue with resection changes.  - Fibroadenomatoid and fibrocystic changes.   05/06/2019 Mammogram   Right Mammogram 05/06/19 IMPRESSION: Suspicious finding with 2 groups of microcalcifications in the medial right breast. The anterior group measures 2 x 2 x 2 mm. The more posterior group measures 4 x 3 x 4 mm.   05/12/2019 Pathology Results   Diagnosis 05/12/19 1. Breast, right, needle core biopsy, medial - DUCTAL CARCINOMA IN SITU WITH CALCIFICATIONS. - SEE MICROSCOPIC DESCRIPTION. 2. Breast, right, needle core biopsy, medial - DUCTAL CARCINOMA IN SITU WITH CALCIFICATIONS. - SEE MICROSCOPIC DESCRIPTION. Results: IMMUNOHISTOCHEMICAL AND MORPHOMETRIC ANALYSIS PERFORMED MANUALLY Estrogen Receptor: 100%, POSITIVE, STRONG STAINING INTENSITY Progesterone Receptor: 0%, NEGATIVE COMMENT: The negative hormone receptor study(ies) in this case has an internal positive control. Results: IMMUNOHISTOCHEMICAL AND MORPHOMETRIC ANALYSIS PERFORMED MANUALLY Estrogen Receptor: 100%, POSITIVE, STRONG STAINING INTENSITY Progesterone Receptor: 0%, NEGATIVE COMMENT: The negative hormone receptor study(ies) in this case has an internal positive control.   07/26/2019 Surgery   RIGHT MASTECTOMY WITH SENTINEL NODE BIOPSY by Dr. Curvin 07/26/19   07/26/2019 Pathology Results   FINAL MICROSCOPIC DIAGNOSIS: 07/26/19  A. LYMPH NODE, RIGHT #1, SENTINEL,  BIOPSY:  -  No carcinoma identified in one lymph node (0/1)   B. BREAST, RIGHT, MASTECTOMY:  -  Ductal carcinoma in situ, intermediate grade, 0.6 cm  -  Margins uninvolved by carcinoma (greater than 1 cm; posterior margin)  -  Previous biopsy site changes (x2)  -  See oncology table below  07/26/2019 Cancer Staging   Staging form: Breast, AJCC 8th Edition - Pathologic stage from 07/26/2019: Stage 0 (pTis (DCIS), pN0, cM0, G2, ER+, PR+, HER2: Not Assessed) - Signed by [name], MD on  08/10/2019   08/15/2023 Genetic Testing   Negative [facility] CancerNext-Expanded +RNAinsight Panel.  VUS in ATM at c.4910-891G>A and SDHA at c.-1C>G.  Report date is 08/15/2023.   The CancerNext-Expanded gene panel offered by [facility] and includes sequencing, rearrangement, and RNA analysis for the following 76 genes: AIP, ALK, APC, ATM, AXIN2, BAP1, BARD1, BMPR1A, BRCA1, BRCA2, BRIP1, CDC73, CDH1, CDK4, CDKN1B, CDKN2A, CEBPA, CHEK2, CTNNA1, DDX41, DICER1, ETV6, FH, FLCN, GATA2, LZTR1, MAX, MBD4, MEN1, MET, MLH1, MSH2, MSH3, MSH6, MUTYH, NF1, NF2, NTHL1, PALB2, PHOX2B, PMS2, POT1, PRKAR1A, PTCH1, PTEN, RAD51C, RAD51D, RB1, RET, RUNX1, SDHA, SDHAF2, SDHB, SDHC, SDHD, SMAD4, SMARCA4, SMARCB1, SMARCE1, STK11, SUFU, TMEM127, TP53, TSC1, TSC2, VHL, and WT1 (sequencing and deletion/duplication); EGFR, HOXB13, KIT, MITF, PDGFRA, POLD1, and POLE (sequencing only); EPCAM and GREM1 (deletion/duplication only).    Pancreatic adenocarcinoma (HCC)  07/07/2022 Cancer Staging   Staging form: Exocrine Pancreas, AJCC 8th Edition - Clinical stage from 07/07/2022: Stage IB (cT2, cN0, cM0) - Signed by [name], MD on 07/22/2023 Stage prefix: Initial diagnosis Total positive nodes: 0   06/13/2023 Initial Diagnosis   Pancreatic adenocarcinoma (HCC)   08/06/2023 - 11/13/2023 Chemotherapy   Patient is on Treatment Plan : PANCREAS Modified FOLFIRINOX q14d x 4 cycles     08/15/2023 Genetic Testing   Negative [facility] CancerNext-Expanded +RNAinsight Panel.  VUS in ATM at c.4910-891G>A and SDHA at c.-1C>G.  Report date is 08/15/2023.   The CancerNext-Expanded gene panel offered by [facility] and includes sequencing, rearrangement, and RNA analysis for the following 76 genes: AIP, ALK, APC, ATM, AXIN2, BAP1, BARD1, BMPR1A, BRCA1, BRCA2, BRIP1, CDC73, CDH1, CDK4, CDKN1B, CDKN2A, CEBPA, CHEK2, CTNNA1, DDX41, DICER1, ETV6, FH, FLCN, GATA2, LZTR1, MAX, MBD4, MEN1, MET, MLH1, MSH2, MSH3, MSH6, MUTYH, NF1, NF2, NTHL1, PALB2, PHOX2B,  PMS2, POT1, PRKAR1A, PTCH1, PTEN, RAD51C, RAD51D, RB1, RET, RUNX1, SDHA, SDHAF2, SDHB, SDHC, SDHD, SMAD4, SMARCA4, SMARCB1, SMARCE1, STK11, SUFU, TMEM127, TP53, TSC1, TSC2, VHL, and WT1 (sequencing and deletion/duplication); EGFR, HOXB13, KIT, MITF, PDGFRA, POLD1, and POLE (sequencing only); EPCAM and GREM1 (deletion/duplication only).    12/02/2023 -  Chemotherapy   Patient is on Treatment Plan : PANCREATIC Abraxane  D1,8 + Gemcitabine  D1,8 q21d        Discussed the use of AI scribe software for clinical note transcription with the patient, who gave verbal consent to proceed.  History of Present Illness Adrienne [name] is a 78 year old female with pancreatic cancer who presents for follow-up.  She is undergoing treatment for pancreatic cancer and maintains stable energy levels. Approximately six weeks ago, she received a blood transfusion. Her last imaging was in May. She experiences no significant adverse effects from chemotherapy, including neuropathy, numbness, or tingling, and uses ice on her fingers as a precaution.  A previous gout flare has resolved with medication, and she currently has no gout-related issues. She has a broken tooth requiring removal and two wisdom teeth that are not urgent, with dental procedures planned during chemotherapy breaks.  She has no cough, fever, pain, stomach pain, tenderness, swelling, or nausea. She has sufficient medication for nausea management.     All other systems were reviewed with the patient and are negative.  MEDICAL HISTORY:  Past Medical History:  Diagnosis Date   Allergy    Ambulates with cane  straight cane   Arthritis    knee, back   CHF (congestive heart failure) (HCC)    Ductal carcinoma in situ of breast 12/2018   R Breast-mastectomy only   Gout    History of blood transfusion 1986   w/ Hysterectomy surgery   Hyperlipidemia    diet controlled - no meds   Hypertension    Hypothyroidism    Pelvic kidney    lower  right pelvic kidney    SBO (small bowel obstruction) (HCC) 10/2016   surgery    Sleep apnea    Mild - no mask needed per sleep study   Smoker    quit smoking 2018   Type 2 diabetes mellitus (HCC)     SURGICAL HISTORY: Past Surgical History:  Procedure Laterality Date   ABDOMINAL HYSTERECTOMY  1986   COMPLETE-precancerous   APPENDECTOMY     pt states it was removed when gallbladder was removed.   AUGMENTATION MAMMAPLASTY     BACK SURGERY     lower back   BILIARY BRUSHING  07/08/2023   Procedure: BILIARY BRUSHING;  Surgeon: Wilhelmenia Aloha Raddle., MD;  Location: [facility];  Service: Gastroenterology;;   BILIARY STENT PLACEMENT N/A 07/08/2023   Procedure: BILIARY STENT PLACEMENT;  Surgeon: Wilhelmenia Aloha Raddle., MD;  Location: [facility];  Service: Gastroenterology;  Laterality: N/A;   BIOPSY  06/14/2023   Procedure: BIOPSY;  Surgeon: Avram Lupita BRAVO, MD;  Location: [facility];  Service: Gastroenterology;;   BIOPSY  07/08/2023   Procedure: BIOPSY;  Surgeon: Wilhelmenia Aloha Raddle., MD;  Location: [facility];  Service: Gastroenterology;;   BREAST BIOPSY Right 05/12/2019   times 2   BREAST LUMPECTOMY WITH RADIOACTIVE SEED LOCALIZATION Right 03/18/2019   Procedure: RIGHT BREAST LUMPECTOMY WITH RADIOACTIVE SEED LOCALIZATION;  Surgeon: [name], MD;  Location: [facility];  Service: General;  Laterality: Right;   CHOLECYSTECTOMY     COLONOSCOPY  02/15/2008   Kaplan    COLONOSCOPY WITH PROPOFOL   02/27/2018   Dr.Nandigam   ECTOPIC PREGNANCY SURGERY     ERCP N/A 06/14/2023   Procedure: ENDOSCOPIC RETROGRADE CHOLANGIOPANCREATOGRAPHY (ERCP);  Surgeon: Avram Lupita BRAVO, MD;  Location: [facility];  Service: Gastroenterology;  Laterality: N/A;   ERCP N/A 06/27/2023   Procedure: ENDOSCOPIC RETROGRADE CHOLANGIOPANCREATOGRAPHY (ERCP);  Surgeon: Charlanne Groom, MD;  Location: [facility];  Service: Gastroenterology;  Laterality: N/A;   ERCP N/A 07/08/2023   Procedure: ENDOSCOPIC  RETROGRADE CHOLANGIOPANCREATOGRAPHY (ERCP);  Surgeon: Wilhelmenia Aloha Raddle., MD;  Location: [facility];  Service: Gastroenterology;  Laterality: N/A;   ESOPHAGOGASTRODUODENOSCOPY N/A 07/08/2023   Procedure: ESOPHAGOGASTRODUODENOSCOPY (EGD);  Surgeon: Wilhelmenia Aloha Raddle., MD;  Location: [facility];  Service: Gastroenterology;  Laterality: N/A;   ESOPHAGOGASTRODUODENOSCOPY (EGD) WITH PROPOFOL  N/A 06/14/2023   Procedure: ESOPHAGOGASTRODUODENOSCOPY (EGD) WITH PROPOFOL ;  Surgeon: Avram Lupita BRAVO, MD;  Location: [facility];  Service: Gastroenterology;  Laterality: N/A;   EUS N/A 07/08/2023   Procedure: UPPER ENDOSCOPIC ULTRASOUND (EUS) LINEAR;  Surgeon: Wilhelmenia Aloha Raddle., MD;  Location: [facility];  Service: Gastroenterology;  Laterality: N/A;   FINE NEEDLE ASPIRATION N/A 07/08/2023   Procedure: FINE NEEDLE ASPIRATION (FNA) LINEAR;  Surgeon: Wilhelmenia Aloha Raddle., MD;  Location: [facility];  Service: Gastroenterology;  Laterality: N/A;   IR IMAGING GUIDED PORT INSERTION  07/29/2023   JOINT REPLACEMENT     Left hip total Dr. Melodi 10-01-17   MALONEY DILATION  06/14/2023   Procedure: AGAPITO DILATION;  Surgeon: Avram Lupita BRAVO, MD;  Location: [facility];  Service: Gastroenterology;;  MASTECTOMY Right 07/26/2019   PANCREATIC STENT PLACEMENT  06/14/2023   Procedure: PANCREATIC STENT PLACEMENT;  Surgeon: Avram Lupita BRAVO, MD;  Location: [facility];  Service: Gastroenterology;;   PANCREATIC STENT PLACEMENT  06/27/2023   Procedure: PANCREATIC STENT PLACEMENT;  Surgeon: Charlanne Groom, MD;  Location: [facility];  Service: Gastroenterology;;   POLYPECTOMY     polypectomy-oropharynx     RE-EXCISION OF BREAST CANCER,SUPERIOR MARGINS Right 04/08/2019   Procedure: RE-EXCISION OF RIGHT BREAST ANTERIOR MARGINS;  Surgeon: Curvin Mt III, MD;  Location: [facility];  Service: General;  Laterality: Right;   right knee meniscus     SBO  10/2016   small bowel obstruction   SIMPLE MASTECTOMY WITH  AXILLARY SENTINEL NODE BIOPSY Right 07/26/2019   Procedure: RIGHT MASTECTOMY WITH SENTINEL NODE BIOPSY;  Surgeon: Curvin Mt MOULD, MD;  Location: [facility];  Service: General;  Laterality: Right;   SPHINCTEROTOMY  06/27/2023   Procedure: SPHINCTEROTOMY;  Surgeon: Charlanne Groom, MD;  Location: [facility];  Service: Gastroenterology;;   ANNETT  07/08/2023   Procedure: ANNETT;  Surgeon: Wilhelmenia Aloha Raddle., MD;  Location: [facility];  Service: Gastroenterology;;   TOTAL HIP ARTHROPLASTY Left 10/01/2017   Procedure: LEFT  TOTAL HIP ARTHROPLASTY ANTERIOR APPROACH;  Surgeon: Melodi Lerner, MD;  Location: [facility];  Service: Orthopedics;  Laterality: Left;   TOTAL HIP ARTHROPLASTY Right 03/11/2018   Procedure: RIGHT TOTAL HIP ARTHROPLASTY ANTERIOR APPROACH;  Surgeon: Melodi Lerner, MD;  Location: [facility];  Service: Orthopedics;  Laterality: Right;    UPPER GASTROINTESTINAL ENDOSCOPY      I have reviewed the social history and family history with the patient and they are unchanged from previous note.  ALLERGIES:  is allergic to lisinopril , oxaliplatin , statins, and atorvastatin .  MEDICATIONS:  Current Outpatient Medications  Medication Sig Dispense Refill   Cholecalciferol (VITAMIN D -3) 125 MCG (5000 UT) TABS Take 1 tablet by mouth daily.     famotidine  (PEPCID ) 20 MG tablet Take 1 tablet (20 mg total) by mouth 2 (two) times daily. (Patient taking differently: Take 20 mg by mouth daily.) 30 tablet 1   fluticasone  (FLONASE ) 50 MCG/ACT nasal spray Place 2 sprays into both nostrils daily. 16 g 6   Glucose Blood (BLOOD GLUCOSE TEST STRIPS) STRP 1 each by In Vitro route in the morning, at noon, and at bedtime. May substitute to any manufacturer covered by patient's insurance. 100 strip 5   insulin  aspart (NOVOLOG ) 100 UNIT/ML FlexPen Inject 0-6 Units into the skin 3 (three) times daily with meals. Check Blood Glucose (BG) and inject per scale: BG <150= 0 unit; BG  150-200= 1 unit; BG 201-250= 2 unit; BG 251-300= 3 unit; BG 301-350= 4 unit; BG 351-400= 5 unit; BG >400= 6 unit and Call Primary Care. 15 mL 0   Insulin  Glargine (BASAGLAR  KWIKPEN) 100 UNIT/ML Inject 12 Units into the skin daily. May substitute as needed per insurance. (Patient taking differently: Inject 12 Units into the skin at bedtime.) 15 mL 2   Lancets (ONETOUCH DELICA PLUS LANCET33G) MISC 1 each by Does not apply route 3 (three) times daily. 100 each 0   lidocaine -prilocaine  (EMLA ) cream Apply 1 Application topically as needed. 30 g 2   losartan  (COZAAR ) 100 MG tablet Take 1 tablet (100 mg total) by mouth daily. 90 tablet 1   NIFEdipine  (ADALAT  CC) 60 MG 24 hr tablet Take 1 tablet (60 mg total) by mouth daily. 90 tablet 1   ondansetron  (ZOFRAN ) 8 MG tablet Take 1  tablet (8 mg total) by mouth every 8 (eight) hours as needed for nausea or vomiting. 20 tablet 1   potassium chloride  (KLOR-CON ) 10 MEQ tablet Take 1 tablet (10 mEq total) by mouth 3 (three) times daily for 7 days, THEN 1 tablet (10 mEq total) daily for 23 days. 44 tablet 0   predniSONE  (STERAPRED UNI-PAK 21 TAB) 10 MG (21) TBPK tablet Use as directed. 21 each 0   prochlorperazine  (COMPAZINE ) 10 MG tablet Take 1 tablet (10 mg total) by mouth every 6 (six) hours as needed for nausea or vomiting. 30 tablet 2   RELION PEN NEEDLES 31G X 6 MM MISC USE 1  THREE TIMES DAILY 100 each 0   thyroid  (NP THYROID ) 90 MG tablet Take 1 tablet (90 mg total) by mouth daily. 30 tablet 5   No current facility-administered medications for this visit.    PHYSICAL EXAMINATION: ECOG PERFORMANCE STATUS: 1 - Symptomatic but completely ambulatory  Vitals:   12/23/23 0957  BP: 132/64  Pulse: 79  Resp: 15  Temp: (!) 97.2 F (36.2 C)  SpO2: 97%   Wt Readings from Last 3 Encounters:  12/23/23 151 lb 6.4 oz (68.7 kg)  12/09/23 149 lb (67.6 kg)  12/02/23 149 lb 3.2 oz (67.7 kg)     GENERAL:alert, no distress and comfortable SKIN: skin color,  texture, turgor are normal, no rashes or significant lesions EYES: normal, Conjunctiva are pink and non-injected, sclera clear NECK: supple, thyroid  normal size, non-tender, without nodularity LYMPH:  no palpable lymphadenopathy in the cervical, axillary  LUNGS: clear to auscultation and percussion with normal breathing effort HEART: regular rate & rhythm and no murmurs and no lower extremity edema ABDOMEN:abdomen soft, non-tender and normal bowel sounds Musculoskeletal:no cyanosis of digits and no clubbing  NEURO: alert & oriented x 3 with fluent speech, no focal motor/sensory deficits  Physical Exam    LABORATORY DATA:  I have reviewed the data as listed    Latest Ref Rng & Units 12/23/2023    9:36 AM 12/09/2023    8:00 AM 12/02/2023   12:50 PM  CBC  WBC 4.0 - 10.5 K/uL 5.6  4.1  18.8   Hemoglobin 12.0 - 15.0 g/dL 7.1  8.6  89.7   Hematocrit 36.0 - 46.0 % 21.7  26.7  31.7   Platelets 150 - 400 K/uL 239  173  300         Latest Ref Rng & Units 12/23/2023    9:36 AM 12/09/2023    8:00 AM 12/02/2023   12:50 PM  CMP  Glucose 70 - 99 mg/dL 872  859  874   BUN 8 - 23 mg/dL 17  13  21    Creatinine 0.44 - 1.00 mg/dL 8.68  8.84  9.04   Sodium 135 - 145 mmol/L 140  140  144   Potassium 3.5 - 5.1 mmol/L 4.2  4.2  4.0   Chloride 98 - 111 mmol/L 107  107  107   CO2 22 - 32 mmol/L 26  26  29    Calcium  8.9 - 10.3 mg/dL 8.2  8.6  8.8   Total Protein 6.5 - 8.1 g/dL 6.5  6.7  6.8   Total Bilirubin 0.0 - 1.2 mg/dL 0.3  0.4  0.4   Alkaline Phos 38 - 126 U/L 102  116  196   AST 15 - 41 U/L 19  23  26    ALT 0 - 44 U/L 13  16  27  RADIOGRAPHIC STUDIES: I have personally reviewed the radiological images as listed and agreed with the findings in the report. No results found.    Orders Placed This Encounter  Procedures   CBC with Differential (Cancer Center Only)    Standing Status:   Future    Expected Date:   02/03/2024    Expiration Date:   02/02/2025   CMP (Cancer Center only)     Standing Status:   Future    Expected Date:   02/03/2024    Expiration Date:   02/02/2025   CBC with Differential (Cancer Center Only)    Standing Status:   Future    Expected Date:   02/10/2024    Expiration Date:   02/09/2025   CMP (Cancer Center only)    Standing Status:   Future    Expected Date:   02/10/2024    Expiration Date:   02/09/2025   All questions were answered. The patient knows to call the clinic with any problems, questions or concerns. No barriers to learning was detected. The total time spent in the appointment was 25 minutes, including review of chart and various tests results, discussions about plan of care and coordination of care plan     Onita Mattock, MD 12/23/2023

## 2023-12-23 NOTE — Progress Notes (Signed)
 Verbal order given to administer 2 units of PRBC fro Dr. Lanny, spoke with Rankin in BB type and screen order received patient will have 2 units of PRBC transfused on 12/25/2023 at [facility].

## 2023-12-25 ENCOUNTER — Ambulatory Visit: Payer: Self-pay | Admitting: Nurse Practitioner

## 2023-12-25 ENCOUNTER — Inpatient Hospital Stay

## 2023-12-25 VITALS — BP 125/65 | HR 74 | Temp 97.9°F | Resp 16

## 2023-12-25 DIAGNOSIS — Z5111 Encounter for antineoplastic chemotherapy: Secondary | ICD-10-CM | POA: Diagnosis not present

## 2023-12-25 DIAGNOSIS — C259 Malignant neoplasm of pancreas, unspecified: Secondary | ICD-10-CM

## 2023-12-25 DIAGNOSIS — Z95828 Presence of other vascular implants and grafts: Secondary | ICD-10-CM

## 2023-12-25 MED ORDER — SODIUM CHLORIDE 0.9% FLUSH
10.0000 mL | INTRAVENOUS | Status: DC | PRN
  Administered 2023-12-25: 10 mL via INTRAVENOUS

## 2023-12-25 MED ORDER — SODIUM CHLORIDE 0.9% FLUSH
10.0000 mL | Freq: Once | INTRAVENOUS | Status: AC
  Administered 2023-12-25: 10 mL via INTRAVENOUS

## 2023-12-25 MED ORDER — SODIUM CHLORIDE 0.9% IV SOLUTION
250.0000 mL | INTRAVENOUS | Status: DC
  Administered 2023-12-25: 250 mL via INTRAVENOUS

## 2023-12-25 MED ORDER — HEPARIN SOD (PORK) LOCK FLUSH 100 UNIT/ML IV SOLN
500.0000 [IU] | Freq: Once | INTRAVENOUS | Status: AC
  Administered 2023-12-25: 500 [IU] via INTRAVENOUS

## 2023-12-25 NOTE — Patient Instructions (Addendum)
 [facility] - A DEPT OF . [facility]  Discharge Instructions: Thank you for choosing [facility] to provide your oncology and hematology care.   If you have a lab appointment with the Cancer Center, please go directly to the Cancer Center and check in at the registration area.   Wear comfortable clothing and clothing appropriate for easy access to any Portacath or PICC line.   We strive to give you quality time with your provider. You may need to reschedule your appointment if you arrive late (15 or more minutes).  Arriving late affects you and other patients whose appointments are after yours.  Also, if you miss three or more appointments without notifying the office, you may be dismissed from the clinic at the provider's discretion.      For prescription refill requests, have your pharmacy contact our office and allow 72 hours for refills to be completed.    Today you received the following Blood Transfusion.  Blood Transfusion, Adult, Care After The following information offers guidance on how to care for yourself after your procedure. Your health care provider may also give you more specific instructions. If you have problems or questions, contact your health care provider. What can I expect after the procedure? After the procedure, it is common to have: Bruising and soreness where the IV was inserted. A headache. Follow these instructions at home: IV insertion site care     Follow instructions from your health care provider about how to take care of your IV insertion site. Make sure you: Wash your hands with soap and water  for at least 20 seconds before and after you change your bandage (dressing). If soap and water  are not available, use hand sanitizer. Change your dressing as told by your health care provider. Check your IV insertion site every day for signs of infection. Check for: Redness, swelling, or pain. Bleeding from the  site. Warmth. Pus or a bad smell. General instructions Take over-the-counter and prescription medicines only as told by your health care provider. Rest as told by your health care provider. Return to your normal activities as told by your health care provider. Keep all follow-up visits. Lab tests may need to be done at certain periods to recheck your blood counts. Contact a health care provider if: You have itching or red, swollen areas of skin (hives). You have a fever or chills. You have pain in the head, back, or chest. You feel anxious or you feel weak after doing your normal activities. You have redness, swelling, warmth, or pain around the IV insertion site. You have blood coming from the IV insertion site that does not stop with pressure. You have pus or a bad smell coming from your IV insertion site. If you received your blood transfusion in an outpatient setting, you will be told whom to contact to report any reactions. Get help right away if: You have symptoms of a serious allergic or immune system reaction, including: Trouble breathing or shortness of breath. Swelling of the face, feeling flushed, or widespread rash. Dark urine or blood in the urine. Fast heartbeat. These symptoms may be an emergency. Get help right away. Call 911. Do not wait to see if the symptoms will go away. Do not drive yourself to the hospital. Summary Bruising and soreness around the IV insertion site are common. Check your IV insertion site every day for signs of infection. Rest as told by your health care provider. Return  to your normal activities as told by your health care provider. Get help right away for symptoms of a serious allergic or immune system reaction to the blood transfusion. This information is not intended to replace advice given to you by your health care provider. Make sure you discuss any questions you have with your health care provider. Document Revised: 09/07/2021 Document  Reviewed: 09/07/2021 Elsevier Patient Education  2024 [employer]   To help prevent nausea and vomiting after your treatment, we encourage you to take your nausea medication as directed.  BELOW ARE SYMPTOMS THAT SHOULD BE REPORTED IMMEDIATELY: *FEVER GREATER THAN 100.4 F (38 C) OR HIGHER *CHILLS OR SWEATING *NAUSEA AND VOMITING THAT IS NOT CONTROLLED WITH YOUR NAUSEA MEDICATION *UNUSUAL SHORTNESS OF BREATH *UNUSUAL BRUISING OR BLEEDING *URINARY PROBLEMS (pain or burning when urinating, or frequent urination) *BOWEL PROBLEMS (unusual diarrhea, constipation, pain near the anus) TENDERNESS IN MOUTH AND THROAT WITH OR WITHOUT PRESENCE OF ULCERS (sore throat, sores in mouth, or a toothache) UNUSUAL RASH, SWELLING OR PAIN  UNUSUAL VAGINAL DISCHARGE OR ITCHING   Items with * indicate a potential emergency and should be followed up as soon as possible or go to the Emergency Department if any problems should occur.  Please show the CHEMOTHERAPY ALERT CARD or IMMUNOTHERAPY ALERT CARD at check-in to the Emergency Department and triage nurse.  Should you have questions after your visit or need to cancel or reschedule your appointment, please contact [facility] - A DEPT OF [facility]  Dept: [phone]  and follow the prompts.  Office hours are 8:00 a.m. to 4:30 p.m. Monday - Friday. Please note that voicemails left after 4:00 p.m. may not be returned until the following business day.  We are closed weekends and major holidays. You have access to a nurse at all times for urgent questions. Please call the main number to the clinic Dept: [phone] and follow the prompts.   For any non-urgent questions, you may also contact your provider using MyChart. We now offer e-Visits for anyone 66 and older to request care online for non-urgent symptoms. For details visit [email or web address].   Also download the MyChart app! Go to the app store, search MyChart, open  the app, select [facility], and log in with your MyChart username and password.

## 2023-12-25 NOTE — Progress Notes (Signed)
 Patient presents today for PRBC x2 units transfusion. Patient is in satisfactory condition with no new complaints voiced.  Vital signs are stable.  We will proceed with transfusion per provider orders.    Patient tolerated treatment well with no complaints voiced.  Patient left ambulatory in stable condition.  Vital signs stable at discharge.  Follow up as scheduled.

## 2023-12-29 LAB — TYPE AND SCREEN
ABO/RH(D): B POS
Antibody Screen: NEGATIVE
Unit division: 0
Unit division: 0

## 2023-12-29 LAB — BPAM RBC
Blood Product Expiration Date: 202507302359
Blood Product Expiration Date: 202507302359
ISSUE DATE / TIME: 202507030716
ISSUE DATE / TIME: 202507030716
Unit Type and Rh: 7300
Unit Type and Rh: 7300

## 2023-12-30 ENCOUNTER — Inpatient Hospital Stay

## 2023-12-30 ENCOUNTER — Inpatient Hospital Stay: Admitting: Hematology

## 2023-12-30 VITALS — BP 136/72 | HR 79 | Temp 97.3°F | Resp 15 | Ht 64.0 in | Wt 148.1 lb

## 2023-12-30 DIAGNOSIS — D0511 Intraductal carcinoma in situ of right breast: Secondary | ICD-10-CM

## 2023-12-30 DIAGNOSIS — Z95828 Presence of other vascular implants and grafts: Secondary | ICD-10-CM

## 2023-12-30 DIAGNOSIS — D6481 Anemia due to antineoplastic chemotherapy: Secondary | ICD-10-CM

## 2023-12-30 DIAGNOSIS — C250 Malignant neoplasm of head of pancreas: Secondary | ICD-10-CM

## 2023-12-30 DIAGNOSIS — K8689 Other specified diseases of pancreas: Secondary | ICD-10-CM

## 2023-12-30 DIAGNOSIS — C259 Malignant neoplasm of pancreas, unspecified: Secondary | ICD-10-CM

## 2023-12-30 DIAGNOSIS — Z5111 Encounter for antineoplastic chemotherapy: Secondary | ICD-10-CM | POA: Diagnosis not present

## 2023-12-30 LAB — CMP (CANCER CENTER ONLY)
ALT: 20 U/L (ref 0–44)
AST: 31 U/L (ref 15–41)
Albumin: 3.5 g/dL (ref 3.5–5.0)
Alkaline Phosphatase: 102 U/L (ref 38–126)
Anion gap: 5 (ref 5–15)
BUN: 18 mg/dL (ref 8–23)
CO2: 27 mmol/L (ref 22–32)
Calcium: 8.7 mg/dL — ABNORMAL LOW (ref 8.9–10.3)
Chloride: 106 mmol/L (ref 98–111)
Creatinine: 1.07 mg/dL — ABNORMAL HIGH (ref 0.44–1.00)
GFR, Estimated: 54 mL/min — ABNORMAL LOW (ref >60)
Glucose, Bld: 99 mg/dL (ref 70–99)
Potassium: 4 mmol/L (ref 3.5–5.1)
Sodium: 138 mmol/L (ref 135–145)
Total Bilirubin: 0.3 mg/dL (ref 0.0–1.2)
Total Protein: 6.7 g/dL (ref 6.5–8.1)

## 2023-12-30 LAB — CBC WITH DIFFERENTIAL (CANCER CENTER ONLY)
Abs Immature Granulocytes: 0 K/uL (ref 0.00–0.07)
Basophils Absolute: 0 K/uL (ref 0.0–0.1)
Basophils Relative: 1 %
Eosinophils Absolute: 0 K/uL (ref 0.0–0.5)
Eosinophils Relative: 0 %
HCT: 30.1 % — ABNORMAL LOW (ref 36.0–46.0)
Hemoglobin: 10 g/dL — ABNORMAL LOW (ref 12.0–15.0)
Immature Granulocytes: 0 %
Lymphocytes Relative: 44 %
Lymphs Abs: 1.7 K/uL (ref 0.7–4.0)
MCH: 31.4 pg (ref 26.0–34.0)
MCHC: 33.2 g/dL (ref 30.0–36.0)
MCV: 94.7 fL (ref 80.0–100.0)
Monocytes Absolute: 0.3 K/uL (ref 0.1–1.0)
Monocytes Relative: 7 %
Neutro Abs: 1.8 K/uL (ref 1.7–7.7)
Neutrophils Relative %: 48 %
Platelet Count: 279 K/uL (ref 150–400)
RBC: 3.18 MIL/uL — ABNORMAL LOW (ref 3.87–5.11)
RDW: 18.6 % — ABNORMAL HIGH (ref 11.5–15.5)
WBC Count: 3.8 K/uL — ABNORMAL LOW (ref 4.0–10.5)
nRBC: 0 % (ref 0.0–0.2)

## 2023-12-30 LAB — SAMPLE TO BLOOD BANK

## 2023-12-30 MED ORDER — PROCHLORPERAZINE MALEATE 10 MG PO TABS
10.0000 mg | ORAL_TABLET | Freq: Once | ORAL | Status: AC
  Administered 2023-12-30: 10 mg via ORAL
  Filled 2023-12-30: qty 1

## 2023-12-30 MED ORDER — SODIUM CHLORIDE 0.9% FLUSH
10.0000 mL | Freq: Once | INTRAVENOUS | Status: AC
  Administered 2023-12-30: 10 mL

## 2023-12-30 MED ORDER — PACLITAXEL PROTEIN-BOUND CHEMO INJECTION 100 MG
100.0000 mg/m2 | Freq: Once | INTRAVENOUS | Status: AC
  Administered 2023-12-30: 175 mg via INTRAVENOUS
  Filled 2023-12-30: qty 35

## 2023-12-30 MED ORDER — HEPARIN SOD (PORK) LOCK FLUSH 100 UNIT/ML IV SOLN
500.0000 [IU] | Freq: Once | INTRAVENOUS | Status: AC | PRN
  Administered 2023-12-30: 500 [IU]

## 2023-12-30 MED ORDER — SODIUM CHLORIDE 0.9% FLUSH
10.0000 mL | INTRAVENOUS | Status: DC | PRN
  Administered 2023-12-30: 10 mL

## 2023-12-30 MED ORDER — SODIUM CHLORIDE 0.9 % IV SOLN
1000.0000 mg/m2 | Freq: Once | INTRAVENOUS | Status: AC
  Administered 2023-12-30: 1786 mg via INTRAVENOUS
  Filled 2023-12-30: qty 46.97

## 2023-12-30 MED ORDER — SODIUM CHLORIDE 0.9 % IV SOLN: INTRAVENOUS | Status: DC

## 2023-12-30 NOTE — Progress Notes (Signed)
 [facility]   [phone] Fax:([phone]   Clinic Follow up Note   Patient Care Team: [name], MD as PCP - General (Internal Medicine) [name], MD as PCP - Cardiology (Cardiology) [name], RN as Oncology Nurse Navigator Glean, Stephane BROCKS, RN (Inactive) as Oncology Nurse Navigator [name], MD as Consulting Physician (General Surgery) [name], MD as Consulting Physician (Hematology) [name], MD as Attending Physician (Radiation Oncology) Nicholaus Sherlean CROME, [facility] (Inactive) as Pharmacist (Pharmacist)  Date of Service:  12/30/2023  CHIEF COMPLAINT: f/u of pancreatic cancer  CURRENT THERAPY:  Gemcitabine  and Abraxane  on day 1 and 8 every 21 days  Oncology History   Pancreatic adenocarcinoma (HCC) cT2N0M0, stage IB -Patient presented with obstructive jaundice, CT and MRI showed a 2.5 cm mass in the head of the pancreas, with pancreatic duct and biliary duct dilatation.  ERCP was attempted twice but failed due to deformed duodenum -She underwent ERCP and stent placement, EUS with fine-needle biopsy of the pancreatic mass by Dr. Wilhelmenia on July 08, 2023.  Biopsy confirmed adenocarcinoma. -Due to the tumor invasion to portal vein and SMV, I recommended neoadjuvant chemotherapy FOLFIRINOX, she started on 08/05/2023 -restaging CT 11/05/2023 showed stable disease  - Patient was seen by pancreatobiliary surgeon Dr. Dasie, who also consulted her colleagues at [facility], and they recommend a total of 6 months neoadjuvant chemotherapy.  Will change her chemo to second line gemcitabine  and Abraxane  for additional 3 months. She started on 12/02/2023  Assessment & Plan Pancreatic cancer Pancreatic cancer under treatment with chemotherapy. No fever, rash, or significant fatigue post-treatment. Weight loss noted, possibly due to scale discrepancies. Blood counts improved post-transfusion, with hemoglobin at 10 g/dL. White blood cell count  slightly low but adequate for treatment continuation. Awaiting current kidney and liver function test results. - Continue chemotherapy as scheduled - Monitor kidney and liver function tests when available - Schedule next CT scan for August  Chemotherapy-induced peripheral neuropathy Tingling in toes since initiation of current chemotherapy regimen, likely due to Abraxane . No numbness reported. - Reduce Abraxane  dose - Advise use of ice on feet to alleviate tingling - Recommend over-the-counter B complex or B12 vitamin supplementation  Anemia due to chemotherapy Anemia secondary to chemotherapy, previously managed with blood transfusion. Hemoglobin improved to 10 g/dL post-transfusion.  Chronic kidney disease, unspecified Chronic kidney disease with recent abnormal kidney function tests. Reports difficulty increasing fluid intake due to nocturia. - Encourage increased fluid intake, aiming for three cups of water  daily if possible  Plan - She is overall tolerating chemotherapy well.  Due to her neuropathy, will reduce Abraxane  dose to 100 mg/m2 - Labs reviewed, adequate for treatment, will proceed cycle 3-day 8 treatment today - She will return in 2 weeks before cycle 4-day 1 therapy -will order her restaging scan on next visit  SUMMARY OF ONCOLOGIC HISTORY: Oncology History  Ductal carcinoma in situ (DCIS) of right breast  01/28/2019 Mammogram   Diagnostic Mammogram 01/28/19  IMPRESSION: Right breast retroareolar 7 mm grouped pleomorphic calcifications are suspicious.   02/02/2019 Initial Biopsy   Diagnosis 02/02/19 Breast, right, needle core biopsy, outer retroareolar - DUCTAL CARCINOMA IN SITU WITH CALCIFICATIONS. Microscopic Comment The ductal carcinoma in situ is intermediate grade. Estrogen and progesterone receptors will be performed.  Results: IMMUNOHISTOCHEMICAL AND MORPHOMETRIC ANALYSIS PERFORMED MANUALLY Estrogen Receptor: 90%, POSITIVE, STRONG STAINING  INTENSITY Progesterone Receptor: 20%, POSITIVE, STRONG STAINING INTENSITY    02/02/2019 Cancer Staging   Staging form: Breast,  AJCC 8th Edition - Clinical stage from 02/02/2019: Stage 0 (cTis (DCIS), cN0, cM0, G2, ER+, PR+, HER2: Not Assessed) - Signed by [name], MD on 02/10/2019   02/04/2019 Initial Diagnosis   Ductal carcinoma in situ (DCIS) of right breast   03/18/2019 Surgery   RIGHT BREAST LUMPECTOMY WITH RADIOACTIVE SEED LOCALIZATION by Dr. Curvin 03/18/19   03/18/2019 Pathology Results   FINAL MICROSCOPIC DIAGNOSIS: 03/18/19 A. BREAST, RIGHT, LUMPECTOMY:  - Ductal carcinoma in situ with calcifications and necrosis, 1.4 cm.  - Ductal carcinoma in situ focally 0.1 cm from medial and anterior  lumpectomy margins.  - Ductal carcinoma in situ involves the final anterior margin (See part  D).  - Ductal carcinoma in situ focally 0.3 cm from superior lumpectomy  margin.  - Biopsy site and biopsy clip.   B. BREAST, RIGHT LATERAL MARGIN, EXCISION:  - Benign breast tissue.  - No ductal carcinoma in situ.  - Final lateral margin greater than 1 cm.   C. BREAST, RIGHT MEDIAL MARGIN, EXCISION:  - Ductal carcinoma in situ, 0.3 cm.  - Ductal carcinoma in situ with focally 0.3 cm from final medial margin.   D. BREAST, RIGHT ANTERIOR MARGIN, EXCISION:  - Ductal carcinoma in situ, 1.5 cm.  - Ductal carcinoma in situ focally involves final anterior margin.   E. BREAST, RIGHT DEEP MARGIN, EXCISION:  - Fibrocystic changes.  - No ductal carcinoma in situ.  - Final posterior margin greater than 0.7 cm.    04/08/2019 Surgery   RE-EXCISION OF RIGHT BREAST ANTERIOR MARGINS by Dr Curvin 04/08/19   04/08/2019 Pathology Results    DIAGNOSIS: 04/08/19  A. BREAST, RIGHT, ANTERIOR SUPERIOR, EXCISION:  - Intermediate grade ductal carcinoma in situ with necrosis.  - In situ carcinoma is <1 mm (not on ink) from the new anterior superior  margin, multifocally.  - Intraductal papilloma.  - Fibrocystic  change.   B. BREAST, RIGHT, ANTERIOR INFERIOR, EXCISION:  - Benign breast tissue with resection changes.   C. BREAST, RIGHT, MEDIAL, EXCISION:  - Benign breast tissue with resection changes.  - Fibroadenomatoid and fibrocystic changes.   05/06/2019 Mammogram   Right Mammogram 05/06/19 IMPRESSION: Suspicious finding with 2 groups of microcalcifications in the medial right breast. The anterior group measures 2 x 2 x 2 mm. The more posterior group measures 4 x 3 x 4 mm.   05/12/2019 Pathology Results   Diagnosis 05/12/19 1. Breast, right, needle core biopsy, medial - DUCTAL CARCINOMA IN SITU WITH CALCIFICATIONS. - SEE MICROSCOPIC DESCRIPTION. 2. Breast, right, needle core biopsy, medial - DUCTAL CARCINOMA IN SITU WITH CALCIFICATIONS. - SEE MICROSCOPIC DESCRIPTION. Results: IMMUNOHISTOCHEMICAL AND MORPHOMETRIC ANALYSIS PERFORMED MANUALLY Estrogen Receptor: 100%, POSITIVE, STRONG STAINING INTENSITY Progesterone Receptor: 0%, NEGATIVE COMMENT: The negative hormone receptor study(ies) in this case has an internal positive control. Results: IMMUNOHISTOCHEMICAL AND MORPHOMETRIC ANALYSIS PERFORMED MANUALLY Estrogen Receptor: 100%, POSITIVE, STRONG STAINING INTENSITY Progesterone Receptor: 0%, NEGATIVE COMMENT: The negative hormone receptor study(ies) in this case has an internal positive control.   07/26/2019 Surgery   RIGHT MASTECTOMY WITH SENTINEL NODE BIOPSY by Dr. Curvin 07/26/19   07/26/2019 Pathology Results   FINAL MICROSCOPIC DIAGNOSIS: 07/26/19  A. LYMPH NODE, RIGHT #1, SENTINEL,  BIOPSY:  -  No carcinoma identified in one lymph node (0/1)   B. BREAST, RIGHT, MASTECTOMY:  -  Ductal carcinoma in situ, intermediate grade, 0.6 cm  -  Margins uninvolved by carcinoma (greater than 1 cm; posterior margin)  -  Previous biopsy site changes (x2)  -  See oncology table below   07/26/2019 Cancer Staging   Staging form: Breast, AJCC 8th Edition - Pathologic stage from 07/26/2019: Stage 0  (pTis (DCIS), pN0, cM0, G2, ER+, PR+, HER2: Not Assessed) - Signed by [name], MD on 08/10/2019   08/15/2023 Genetic Testing   Negative [facility] CancerNext-Expanded +RNAinsight Panel.  VUS in ATM at c.4910-891G>A and SDHA at c.-1C>G.  Report date is 08/15/2023.   The CancerNext-Expanded gene panel offered by [facility] and includes sequencing, rearrangement, and RNA analysis for the following 76 genes: AIP, ALK, APC, ATM, AXIN2, BAP1, BARD1, BMPR1A, BRCA1, BRCA2, BRIP1, CDC73, CDH1, CDK4, CDKN1B, CDKN2A, CEBPA, CHEK2, CTNNA1, DDX41, DICER1, ETV6, FH, FLCN, GATA2, LZTR1, MAX, MBD4, MEN1, MET, MLH1, MSH2, MSH3, MSH6, MUTYH, NF1, NF2, NTHL1, PALB2, PHOX2B, PMS2, POT1, PRKAR1A, PTCH1, PTEN, RAD51C, RAD51D, RB1, RET, RUNX1, SDHA, SDHAF2, SDHB, SDHC, SDHD, SMAD4, SMARCA4, SMARCB1, SMARCE1, STK11, SUFU, TMEM127, TP53, TSC1, TSC2, VHL, and WT1 (sequencing and deletion/duplication); EGFR, HOXB13, KIT, MITF, PDGFRA, POLD1, and POLE (sequencing only); EPCAM and GREM1 (deletion/duplication only).    Pancreatic adenocarcinoma (HCC)  07/07/2022 Cancer Staging   Staging form: Exocrine Pancreas, AJCC 8th Edition - Clinical stage from 07/07/2022: Stage IB (cT2, cN0, cM0) - Signed by [name], MD on 07/22/2023 Stage prefix: Initial diagnosis Total positive nodes: 0   06/13/2023 Initial Diagnosis   Pancreatic adenocarcinoma (HCC)   08/06/2023 - 11/13/2023 Chemotherapy   Patient is on Treatment Plan : PANCREAS Modified FOLFIRINOX q14d x 4 cycles     08/15/2023 Genetic Testing   Negative [facility] CancerNext-Expanded +RNAinsight Panel.  VUS in ATM at c.4910-891G>A and SDHA at c.-1C>G.  Report date is 08/15/2023.   The CancerNext-Expanded gene panel offered by [facility] and includes sequencing, rearrangement, and RNA analysis for the following 76 genes: AIP, ALK, APC, ATM, AXIN2, BAP1, BARD1, BMPR1A, BRCA1, BRCA2, BRIP1, CDC73, CDH1, CDK4, CDKN1B, CDKN2A, CEBPA, CHEK2, CTNNA1, DDX41, DICER1, ETV6, FH, FLCN, GATA2,  LZTR1, MAX, MBD4, MEN1, MET, MLH1, MSH2, MSH3, MSH6, MUTYH, NF1, NF2, NTHL1, PALB2, PHOX2B, PMS2, POT1, PRKAR1A, PTCH1, PTEN, RAD51C, RAD51D, RB1, RET, RUNX1, SDHA, SDHAF2, SDHB, SDHC, SDHD, SMAD4, SMARCA4, SMARCB1, SMARCE1, STK11, SUFU, TMEM127, TP53, TSC1, TSC2, VHL, and WT1 (sequencing and deletion/duplication); EGFR, HOXB13, KIT, MITF, PDGFRA, POLD1, and POLE (sequencing only); EPCAM and GREM1 (deletion/duplication only).    12/02/2023 -  Chemotherapy   Patient is on Treatment Plan : PANCREATIC Abraxane  D1,8 + Gemcitabine  D1,8 q21d        Discussed the use of AI scribe software for clinical note transcription with the patient, who gave verbal consent to proceed.  History of Present Illness Adrienne [name] is a 78 year old female with pancreatic cancer who presents for follow-up.  She is undergoing chemotherapy without fever, rash, or fatigue post-infusion. She has experienced weight loss since last week, though the exact amount is uncertain. A recent blood transfusion improved her hemoglobin levels and her overall well-being. She struggles with increasing water  intake due to nocturia, consuming approximately two cups of water  daily. Tingling in her toes began with the current treatment regimen, with no numbness or involvement of her hands.     All other systems were reviewed with the patient and are negative.  MEDICAL HISTORY:  Past Medical History:  Diagnosis Date   Allergy    Ambulates with cane    straight cane   Arthritis    knee, back   CHF (congestive heart failure) (HCC)    Ductal carcinoma in situ of breast 12/2018   R  Breast-mastectomy only   Gout    History of blood transfusion 1986   w/ Hysterectomy surgery   Hyperlipidemia    diet controlled - no meds   Hypertension    Hypothyroidism    Pelvic kidney    lower right pelvic kidney    SBO (small bowel obstruction) (HCC) 10/2016   surgery    Sleep apnea    Mild - no mask needed per sleep study   Smoker     quit smoking 2018   Type 2 diabetes mellitus (HCC)     SURGICAL HISTORY: Past Surgical History:  Procedure Laterality Date   ABDOMINAL HYSTERECTOMY  1986   COMPLETE-precancerous   APPENDECTOMY     pt states it was removed when gallbladder was removed.   AUGMENTATION MAMMAPLASTY     BACK SURGERY     lower back   BILIARY BRUSHING  07/08/2023   Procedure: BILIARY BRUSHING;  Surgeon: Wilhelmenia Aloha Raddle., MD;  Location: [facility];  Service: Gastroenterology;;   BILIARY STENT PLACEMENT N/A 07/08/2023   Procedure: BILIARY STENT PLACEMENT;  Surgeon: Wilhelmenia Aloha Raddle., MD;  Location: [facility];  Service: Gastroenterology;  Laterality: N/A;   BIOPSY  06/14/2023   Procedure: BIOPSY;  Surgeon: Avram Lupita BRAVO, MD;  Location: [facility];  Service: Gastroenterology;;   BIOPSY  07/08/2023   Procedure: BIOPSY;  Surgeon: Wilhelmenia Aloha Raddle., MD;  Location: [facility];  Service: Gastroenterology;;   BREAST BIOPSY Right 05/12/2019   times 2   BREAST LUMPECTOMY WITH RADIOACTIVE SEED LOCALIZATION Right 03/18/2019   Procedure: RIGHT BREAST LUMPECTOMY WITH RADIOACTIVE SEED LOCALIZATION;  Surgeon: [name], MD;  Location[facility];  Service: General;  Laterality: Right;   CHOLECYSTECTOMY     COLONOSCOPY  02/15/2008   Kaplan    COLONOSCOPY WITH PROPOFOL   02/27/2018   Dr.Nandigam   ECTOPIC PREGNANCY SURGERY     ERCP N/A 06/14/2023   Procedure: ENDOSCOPIC RETROGRADE CHOLANGIOPANCREATOGRAPHY (ERCP);  Surgeon: Avram Lupita BRAVO, MD;  Location: [facility];  Service: Gastroenterology;  Laterality: N/A;   ERCP N/A 06/27/2023   Procedure: ENDOSCOPIC RETROGRADE CHOLANGIOPANCREATOGRAPHY (ERCP);  Surgeon: Charlanne Groom, MD;  Location: [facility];  Service: Gastroenterology;  Laterality: N/A;   ERCP N/A 07/08/2023   Procedure: ENDOSCOPIC RETROGRADE CHOLANGIOPANCREATOGRAPHY (ERCP);  Surgeon: Wilhelmenia Aloha Raddle., MD;  Location: [facility];  Service: Gastroenterology;  Laterality: N/A;    ESOPHAGOGASTRODUODENOSCOPY N/A 07/08/2023   Procedure: ESOPHAGOGASTRODUODENOSCOPY (EGD);  Surgeon: Wilhelmenia Aloha Raddle., MD;  Location: [facility];  Service: Gastroenterology;  Laterality: N/A;   ESOPHAGOGASTRODUODENOSCOPY (EGD) WITH PROPOFOL  N/A 06/14/2023   Procedure: ESOPHAGOGASTRODUODENOSCOPY (EGD) WITH PROPOFOL ;  Surgeon: Avram Lupita BRAVO, MD;  Location: [facility];  Service: Gastroenterology;  Laterality: N/A;   EUS N/A 07/08/2023   Procedure: UPPER ENDOSCOPIC ULTRASOUND (EUS) LINEAR;  Surgeon: Wilhelmenia Aloha Raddle., MD;  Location: [facility];  Service: Gastroenterology;  Laterality: N/A;   FINE NEEDLE ASPIRATION N/A 07/08/2023   Procedure: FINE NEEDLE ASPIRATION (FNA) LINEAR;  Surgeon: Wilhelmenia Aloha Raddle., MD;  Location: [facility];  Service: Gastroenterology;  Laterality: N/A;   IR IMAGING GUIDED PORT INSERTION  07/29/2023   JOINT REPLACEMENT     Left hip total Dr. Melodi 10-01-17   MALONEY DILATION  06/14/2023   Procedure: AGAPITO DILATION;  Surgeon: Avram Lupita BRAVO, MD;  Location: [facility];  Service: Gastroenterology;;   MASTECTOMY Right 07/26/2019   PANCREATIC STENT PLACEMENT  06/14/2023   Procedure: PANCREATIC STENT PLACEMENT;  Surgeon: Avram Lupita BRAVO, MD;  Location: [facility];  Service: Gastroenterology;;  PANCREATIC STENT PLACEMENT  06/27/2023   Procedure: PANCREATIC STENT PLACEMENT;  Surgeon: Charlanne Groom, MD;  Location: [facility];  Service: Gastroenterology;;   POLYPECTOMY     polypectomy-oropharynx     RE-EXCISION OF BREAST CANCER,SUPERIOR MARGINS Right 04/08/2019   Procedure: RE-EXCISION OF RIGHT BREAST ANTERIOR MARGINS;  Surgeon: Curvin Mt III, MD;  Location: [facility];  Service: General;  Laterality: Right;   right knee meniscus     SBO  10/2016   small bowel obstruction   SIMPLE MASTECTOMY WITH AXILLARY SENTINEL NODE BIOPSY Right 07/26/2019   Procedure: RIGHT MASTECTOMY WITH SENTINEL NODE BIOPSY;  Surgeon: Curvin Mt MOULD, MD;  Location: [facility];  Service: General;  Laterality: Right;   SPHINCTEROTOMY  06/27/2023   Procedure: SPHINCTEROTOMY;  Surgeon: Charlanne Groom, MD;  Location: [facility];  Service: Gastroenterology;;   ANNETT  07/08/2023   Procedure: ANNETT;  Surgeon: Wilhelmenia Aloha Raddle., MD;  Location: [facility];  Service: Gastroenterology;;   TOTAL HIP ARTHROPLASTY Left 10/01/2017   Procedure: LEFT  TOTAL HIP ARTHROPLASTY ANTERIOR APPROACH;  Surgeon: Melodi Lerner, MD;  Location: [facility];  Service: Orthopedics;  Laterality: Left;   TOTAL HIP ARTHROPLASTY Right 03/11/2018   Procedure: RIGHT TOTAL HIP ARTHROPLASTY ANTERIOR APPROACH;  Surgeon: Melodi Lerner, MD;  Location: [facility];  Service: Orthopedics;  Laterality: Right;    UPPER GASTROINTESTINAL ENDOSCOPY      I have reviewed the social history and family history with the patient and they are unchanged from previous note.  ALLERGIES:  is allergic to lisinopril , oxaliplatin , statins, and atorvastatin .  MEDICATIONS:  Current Outpatient Medications  Medication Sig Dispense Refill   Cholecalciferol (VITAMIN D -3) 125 MCG (5000 UT) TABS Take 1 tablet by mouth daily.     famotidine  (PEPCID ) 20 MG tablet Take 1 tablet (20 mg total) by mouth 2 (two) times daily. (Patient taking differently: Take 20 mg by mouth daily.) 30 tablet 1   fluticasone  (FLONASE ) 50 MCG/ACT nasal spray Place 2 sprays into both nostrils daily. 16 g 6   Glucose Blood (BLOOD GLUCOSE TEST STRIPS) STRP 1 each by In Vitro route in the morning, at noon, and at bedtime. May substitute to any manufacturer covered by patient's insurance. 100 strip 5   insulin  aspart (NOVOLOG ) 100 UNIT/ML FlexPen Inject 0-6 Units into the skin 3 (three) times daily with meals. Check Blood Glucose (BG) and inject per scale: BG <150= 0 unit; BG 150-200= 1 unit; BG 201-250= 2 unit; BG 251-300= 3 unit; BG 301-350= 4 unit; BG 351-400= 5 unit; BG >400= 6 unit and Call Primary Care. 15 mL 0   Insulin   Glargine (BASAGLAR  KWIKPEN) 100 UNIT/ML Inject 12 Units into the skin daily. May substitute as needed per insurance. (Patient taking differently: Inject 12 Units into the skin at bedtime.) 15 mL 2   Lancets (ONETOUCH DELICA PLUS LANCET33G) MISC 1 each by Does not apply route 3 (three) times daily. 100 each 0   lidocaine -prilocaine  (EMLA ) cream Apply 1 Application topically as needed. 30 g 2   losartan  (COZAAR ) 100 MG tablet Take 1 tablet (100 mg total) by mouth daily. 90 tablet 1   NIFEdipine  (ADALAT  CC) 60 MG 24 hr tablet Take 1 tablet (60 mg total) by mouth daily. 90 tablet 1   ondansetron  (ZOFRAN ) 8 MG tablet Take 1 tablet (8 mg total) by mouth every 8 (eight) hours as needed for nausea or vomiting. 20 tablet 1   potassium chloride  (KLOR-CON ) 10 MEQ tablet Take 1 tablet (10  mEq total) by mouth 3 (three) times daily for 7 days, THEN 1 tablet (10 mEq total) daily for 23 days. 44 tablet 0   predniSONE  (STERAPRED UNI-PAK 21 TAB) 10 MG (21) TBPK tablet Use as directed. 21 each 0   prochlorperazine  (COMPAZINE ) 10 MG tablet Take 1 tablet (10 mg total) by mouth every 6 (six) hours as needed for nausea or vomiting. 30 tablet 2   RELION PEN NEEDLES 31G X 6 MM MISC USE 1  THREE TIMES DAILY 100 each 0   thyroid  (NP THYROID ) 90 MG tablet Take 1 tablet (90 mg total) by mouth daily. 30 tablet 5   No current facility-administered medications for this visit.   Facility-Administered Medications Ordered in Other Visits  Medication Dose Route Frequency Provider Last Rate Last Admin   0.9 %  sodium chloride  infusion   Intravenous Continuous [name], MD   Stopped at 12/30/23 1254   sodium chloride  flush (NS) 0.9 % injection 10 mL  10 mL Intracatheter PRN [name], MD   10 mL at 12/30/23 1254    PHYSICAL EXAMINATION: ECOG PERFORMANCE STATUS: 1 - Symptomatic but completely ambulatory  Vitals:   12/30/23 0956 12/30/23 1000  BP: (!) 161/77 136/72  Pulse: 79   Resp: 15   Temp: (!) 97.3 F (36.3 C)   SpO2:  100%    Wt Readings from Last 3 Encounters:  12/30/23 148 lb 1.6 oz (67.2 kg)  12/23/23 151 lb 6.4 oz (68.7 kg)  12/09/23 149 lb (67.6 kg)     GENERAL:alert, no distress and comfortable SKIN: skin color, texture, turgor are normal, no rashes or significant lesions EYES: normal, Conjunctiva are pink and non-injected, sclera clear NECK: supple, thyroid  normal size, non-tender, without nodularity LYMPH:  no palpable lymphadenopathy in the cervical, axillary  LUNGS: clear to auscultation and percussion with normal breathing effort HEART: regular rate & rhythm and no murmurs and no lower extremity edema ABDOMEN:abdomen soft, non-tender and normal bowel sounds Musculoskeletal:no cyanosis of digits and no clubbing  NEURO: alert & oriented x 3 with fluent speech, no focal motor/sensory deficits  Physical Exam    LABORATORY DATA:  I have reviewed the data as listed    Latest Ref Rng & Units 12/30/2023    9:37 AM 12/23/2023    9:36 AM 12/09/2023    8:00 AM  CBC  WBC 4.0 - 10.5 K/uL 3.8  5.6  4.1   Hemoglobin 12.0 - 15.0 g/dL 89.9  7.1  8.6   Hematocrit 36.0 - 46.0 % 30.1  21.7  26.7   Platelets 150 - 400 K/uL 279  239  173         Latest Ref Rng & Units 12/30/2023    9:37 AM 12/23/2023    9:36 AM 12/09/2023    8:00 AM  CMP  Glucose 70 - 99 mg/dL 99  872  859   BUN 8 - 23 mg/dL 18  17  13    Creatinine 0.44 - 1.00 mg/dL 8.92  8.68  8.84   Sodium 135 - 145 mmol/L 138  140  140   Potassium 3.5 - 5.1 mmol/L 4.0  4.2  4.2   Chloride 98 - 111 mmol/L 106  107  107   CO2 22 - 32 mmol/L 27  26  26    Calcium  8.9 - 10.3 mg/dL 8.7  8.2  8.6   Total Protein 6.5 - 8.1 g/dL 6.7  6.5  6.7   Total Bilirubin 0.0 - 1.2 mg/dL  0.3  0.3  0.4   Alkaline Phos 38 - 126 U/L 102  102  116   AST 15 - 41 U/L 31  19  23    ALT 0 - 44 U/L 20  13  16        RADIOGRAPHIC STUDIES: I have personally reviewed the radiological images as listed and agreed with the findings in the report. No results found.    No  orders of the defined types were placed in this encounter.  All questions were answered. The patient knows to call the clinic with any problems, questions or concerns. No barriers to learning was detected. The total time spent in the appointment was 25 minutes, including review of chart and various tests results, discussions about plan of care and coordination of care plan     Onita Mattock, MD 12/30/2023

## 2023-12-30 NOTE — Assessment & Plan Note (Signed)
 cT2N0M0, stage IB -Patient presented with obstructive jaundice, CT and MRI showed a 2.5 cm mass in the head of the pancreas, with pancreatic duct and biliary duct dilatation.  ERCP was attempted twice but failed due to deformed duodenum -She underwent ERCP and stent placement, EUS with fine-needle biopsy of the pancreatic mass by Dr. Wilhelmenia on July 08, 2023.  Biopsy confirmed adenocarcinoma. -Due to the tumor invasion to portal vein and SMV, I recommended neoadjuvant chemotherapy FOLFIRINOX, she started on 08/05/2023 -restaging CT 11/05/2023 showed stable disease  - Patient was seen by pancreatobiliary surgeon Dr. Dasie, who also consulted her colleagues at [facility], and they recommend a total of 6 months neoadjuvant chemotherapy.  Will change her chemo to second line gemcitabine  and Abraxane  for additional 3 months. She started on 12/02/2023

## 2023-12-30 NOTE — Patient Instructions (Signed)
 [facility] - A DEPT OF [facility]  Discharge Instructions: Thank you for choosing [facility] to provide your oncology and hematology care.   If you have a lab appointment with the Cancer Center, please go directly to the Cancer Center and check in at the registration area.   Wear comfortable clothing and clothing appropriate for easy access to any Portacath or PICC line.   We strive to give you quality time with your provider. You may need to reschedule your appointment if you arrive late (15 or more minutes).  Arriving late affects you and other patients whose appointments are after yours.  Also, if you miss three or more appointments without notifying the office, you may be dismissed from the clinic at the provider's discretion.      For prescription refill requests, have your pharmacy contact our office and allow 72 hours for refills to be completed.    Today you received the following chemotherapy and/or immunotherapy agents: Paclitaxel protein bound (Abraxane) and Gemcitabine (Gemzar)      To help prevent nausea and vomiting after your treatment, we encourage you to take your nausea medication as directed.  BELOW ARE SYMPTOMS THAT SHOULD BE REPORTED IMMEDIATELY: *FEVER GREATER THAN 100.4 F (38 C) OR HIGHER *CHILLS OR SWEATING *NAUSEA AND VOMITING THAT IS NOT CONTROLLED WITH YOUR NAUSEA MEDICATION *UNUSUAL SHORTNESS OF BREATH *UNUSUAL BRUISING OR BLEEDING *URINARY PROBLEMS (pain or burning when urinating, or frequent urination) *BOWEL PROBLEMS (unusual diarrhea, constipation, pain near the anus) TENDERNESS IN MOUTH AND THROAT WITH OR WITHOUT PRESENCE OF ULCERS (sore throat, sores in mouth, or a toothache) UNUSUAL RASH, SWELLING OR PAIN  UNUSUAL VAGINAL DISCHARGE OR ITCHING   Items with * indicate a potential emergency and should be followed up as soon as possible or go to the Emergency Department if any problems should occur.  Please  show the CHEMOTHERAPY ALERT CARD or IMMUNOTHERAPY ALERT CARD at check-in to the Emergency Department and triage nurse.  Should you have questions after your visit or need to cancel or reschedule your appointment, please contact [facility] - A DEPT OF [facility]  Dept: [phone]  and follow the prompts.  Office hours are 8:00 a.m. to 4:30 p.m. Monday - Friday. Please note that voicemails left after 4:00 p.m. may not be returned until the following business day.  We are closed weekends and major holidays. You have access to a nurse at all times for urgent questions. Please call the main number to the clinic Dept: [phone] and follow the prompts.   For any non-urgent questions, you may also contact your provider using MyChart. We now offer e-Visits for anyone 31 and older to request care online for non-urgent symptoms. For details visit [email or web address].   Also download the MyChart app! Go to the app store, search "MyChart", open the app, select [place], and log in with your MyChart username and password.

## 2024-01-05 ENCOUNTER — Other Ambulatory Visit: Payer: Self-pay

## 2024-01-13 ENCOUNTER — Inpatient Hospital Stay: Admitting: Hematology

## 2024-01-13 ENCOUNTER — Inpatient Hospital Stay

## 2024-01-13 VITALS — BP 143/74 | HR 58 | Temp 97.7°F | Resp 16

## 2024-01-13 VITALS — BP 152/72 | HR 65 | Temp 97.3°F | Resp 16 | Ht 64.0 in | Wt 149.4 lb

## 2024-01-13 DIAGNOSIS — C250 Malignant neoplasm of head of pancreas: Secondary | ICD-10-CM

## 2024-01-13 DIAGNOSIS — C259 Malignant neoplasm of pancreas, unspecified: Secondary | ICD-10-CM

## 2024-01-13 DIAGNOSIS — K8689 Other specified diseases of pancreas: Secondary | ICD-10-CM

## 2024-01-13 DIAGNOSIS — Z5111 Encounter for antineoplastic chemotherapy: Secondary | ICD-10-CM | POA: Diagnosis not present

## 2024-01-13 DIAGNOSIS — Z95828 Presence of other vascular implants and grafts: Secondary | ICD-10-CM

## 2024-01-13 DIAGNOSIS — D6481 Anemia due to antineoplastic chemotherapy: Secondary | ICD-10-CM

## 2024-01-13 DIAGNOSIS — D0511 Intraductal carcinoma in situ of right breast: Secondary | ICD-10-CM

## 2024-01-13 LAB — SAMPLE TO BLOOD BANK

## 2024-01-13 LAB — CMP (CANCER CENTER ONLY)
ALT: 15 U/L (ref 0–44)
AST: 22 U/L (ref 15–41)
Albumin: 3.5 g/dL (ref 3.5–5.0)
Alkaline Phosphatase: 98 U/L (ref 38–126)
Anion gap: 8 (ref 5–15)
BUN: 12 mg/dL (ref 8–23)
CO2: 25 mmol/L (ref 22–32)
Calcium: 8.7 mg/dL — ABNORMAL LOW (ref 8.9–10.3)
Chloride: 107 mmol/L (ref 98–111)
Creatinine: 0.96 mg/dL (ref 0.44–1.00)
GFR, Estimated: >60 mL/min (ref >60)
Glucose, Bld: 132 mg/dL — ABNORMAL HIGH (ref 70–99)
Potassium: 3.9 mmol/L (ref 3.5–5.1)
Sodium: 140 mmol/L (ref 135–145)
Total Bilirubin: 0.3 mg/dL (ref 0.0–1.2)
Total Protein: 6.9 g/dL (ref 6.5–8.1)

## 2024-01-13 LAB — CBC WITH DIFFERENTIAL (CANCER CENTER ONLY)
Abs Immature Granulocytes: 0.02 K/uL (ref 0.00–0.07)
Basophils Absolute: 0 K/uL (ref 0.0–0.1)
Basophils Relative: 0 %
Eosinophils Absolute: 0.2 K/uL (ref 0.0–0.5)
Eosinophils Relative: 2 %
HCT: 27.8 % — ABNORMAL LOW (ref 36.0–46.0)
Hemoglobin: 9.3 g/dL — ABNORMAL LOW (ref 12.0–15.0)
Immature Granulocytes: 0 %
Lymphocytes Relative: 32 %
Lymphs Abs: 2.1 K/uL (ref 0.7–4.0)
MCH: 32.3 pg (ref 26.0–34.0)
MCHC: 33.5 g/dL (ref 30.0–36.0)
MCV: 96.5 fL (ref 80.0–100.0)
Monocytes Absolute: 0.9 K/uL (ref 0.1–1.0)
Monocytes Relative: 13 %
Neutro Abs: 3.4 K/uL (ref 1.7–7.7)
Neutrophils Relative %: 53 %
Platelet Count: 265 K/uL (ref 150–400)
RBC: 2.88 MIL/uL — ABNORMAL LOW (ref 3.87–5.11)
RDW: 19 % — ABNORMAL HIGH (ref 11.5–15.5)
WBC Count: 6.6 K/uL (ref 4.0–10.5)
nRBC: 0 % (ref 0.0–0.2)

## 2024-01-13 MED ORDER — SODIUM CHLORIDE 0.9% FLUSH
10.0000 mL | Freq: Once | INTRAVENOUS | Status: AC
Start: 2024-01-13 — End: 2024-01-13
  Administered 2024-01-13: 10 mL

## 2024-01-13 MED ORDER — SODIUM CHLORIDE 0.9 % IV SOLN
INTRAVENOUS | Status: DC
Start: 2024-01-13 — End: 2024-01-13

## 2024-01-13 MED ORDER — HEPARIN SOD (PORK) LOCK FLUSH 100 UNIT/ML IV SOLN
500.0000 [IU] | Freq: Once | INTRAVENOUS | Status: AC | PRN
Start: 2024-01-13 — End: 2024-01-13
  Administered 2024-01-13: 500 [IU]

## 2024-01-13 MED ORDER — PACLITAXEL PROTEIN-BOUND CHEMO INJECTION 100 MG
80.0000 mg/m2 | Freq: Once | INTRAVENOUS | Status: AC
  Administered 2024-01-13: 150 mg via INTRAVENOUS
  Filled 2024-01-13: qty 30

## 2024-01-13 MED ORDER — SODIUM CHLORIDE 0.9 % IV SOLN
1000.0000 mg/m2 | Freq: Once | INTRAVENOUS | Status: AC
  Administered 2024-01-13: 1786 mg via INTRAVENOUS
  Filled 2024-01-13: qty 46.97

## 2024-01-13 MED ORDER — SODIUM CHLORIDE 0.9% FLUSH
10.0000 mL | INTRAVENOUS | Status: DC | PRN
  Administered 2024-01-13: 10 mL

## 2024-01-13 MED ORDER — PROCHLORPERAZINE MALEATE 10 MG PO TABS
10.0000 mg | ORAL_TABLET | Freq: Once | ORAL | Status: AC
  Administered 2024-01-13: 10 mg via ORAL
  Filled 2024-01-13: qty 1

## 2024-01-13 NOTE — Patient Instructions (Signed)
 [facility] - A DEPT OF [facility]  Discharge Instructions: Thank you for choosing [facility] to provide your oncology and hematology care.   If you have a lab appointment with the Cancer Center, please go directly to the Cancer Center and check in at the registration area.   Wear comfortable clothing and clothing appropriate for easy access to any Portacath or PICC line.   We strive to give you quality time with your provider. You may need to reschedule your appointment if you arrive late (15 or more minutes).  Arriving late affects you and other patients whose appointments are after yours.  Also, if you miss three or more appointments without notifying the office, you may be dismissed from the clinic at the provider's discretion.      For prescription refill requests, have your pharmacy contact our office and allow 72 hours for refills to be completed.    Today you received the following chemotherapy and/or immunotherapy agents: Paclitaxel protein bound (Abraxane) and Gemcitabine (Gemzar)      To help prevent nausea and vomiting after your treatment, we encourage you to take your nausea medication as directed.  BELOW ARE SYMPTOMS THAT SHOULD BE REPORTED IMMEDIATELY: *FEVER GREATER THAN 100.4 F (38 C) OR HIGHER *CHILLS OR SWEATING *NAUSEA AND VOMITING THAT IS NOT CONTROLLED WITH YOUR NAUSEA MEDICATION *UNUSUAL SHORTNESS OF BREATH *UNUSUAL BRUISING OR BLEEDING *URINARY PROBLEMS (pain or burning when urinating, or frequent urination) *BOWEL PROBLEMS (unusual diarrhea, constipation, pain near the anus) TENDERNESS IN MOUTH AND THROAT WITH OR WITHOUT PRESENCE OF ULCERS (sore throat, sores in mouth, or a toothache) UNUSUAL RASH, SWELLING OR PAIN  UNUSUAL VAGINAL DISCHARGE OR ITCHING   Items with * indicate a potential emergency and should be followed up as soon as possible or go to the Emergency Department if any problems should occur.  Please  show the CHEMOTHERAPY ALERT CARD or IMMUNOTHERAPY ALERT CARD at check-in to the Emergency Department and triage nurse.  Should you have questions after your visit or need to cancel or reschedule your appointment, please contact [facility] - A DEPT OF [facility]  Dept: [phone]  and follow the prompts.  Office hours are 8:00 a.m. to 4:30 p.m. Monday - Friday. Please note that voicemails left after 4:00 p.m. may not be returned until the following business day.  We are closed weekends and major holidays. You have access to a nurse at all times for urgent questions. Please call the main number to the clinic Dept: [phone] and follow the prompts.   For any non-urgent questions, you may also contact your provider using MyChart. We now offer e-Visits for anyone 31 and older to request care online for non-urgent symptoms. For details visit [email or web address].   Also download the MyChart app! Go to the app store, search "MyChart", open the app, select [place], and log in with your MyChart username and password.

## 2024-01-13 NOTE — Assessment & Plan Note (Signed)
 cT2N0M0, stage IB -Patient presented with obstructive jaundice, CT and MRI showed a 2.5 cm mass in the head of the pancreas, with pancreatic duct and biliary duct dilatation.  ERCP was attempted twice but failed due to deformed duodenum -She underwent ERCP and stent placement, EUS with fine-needle biopsy of the pancreatic mass by Dr. Wilhelmenia on July 08, 2023.  Biopsy confirmed adenocarcinoma. -Due to the tumor invasion to portal vein and SMV, I recommended neoadjuvant chemotherapy FOLFIRINOX, she started on 08/05/2023 -restaging CT 11/05/2023 showed stable disease  - Patient was seen by pancreatobiliary surgeon Dr. Dasie, who also consulted her colleagues at [facility], and they recommend a total of 6 months neoadjuvant chemotherapy.  Will change her chemo to second line gemcitabine  and Abraxane  for additional 3 months. She started on 12/02/2023

## 2024-01-13 NOTE — Progress Notes (Signed)
 [facility]   [phone] [phone]   Clinic Follow up Note   Patient Care Team: [name], MD as PCP - General (Internal Medicine) [name], MD as PCP - Cardiology (Cardiology) [name], RN as Oncology Nurse Navigator Glean, Stephane BROCKS, RN (Inactive) as Oncology Nurse Navigator [name], MD as Consulting Physician (General Surgery) [name], MD as Consulting Physician (Hematology) [name], MD as Attending Physician (Radiation Oncology) Nicholaus Sherlean CROME, [facility] (Inactive) as Pharmacist (Pharmacist)  Date of Service:  01/13/2024  CHIEF COMPLAINT: f/u of pancreatic cancer  CURRENT THERAPY:  Abraxane  and gemcitabine  on day 1 and 8 every 21 days  Oncology History   Pancreatic adenocarcinoma (HCC) cT2N0M0, stage IB -Patient presented with obstructive jaundice, CT and MRI showed a 2.5 cm mass in the head of the pancreas, with pancreatic duct and biliary duct dilatation.  ERCP was attempted twice but failed due to deformed duodenum -She underwent ERCP and stent placement, EUS with fine-needle biopsy of the pancreatic mass by Dr. Wilhelmenia on July 08, 2023.  Biopsy confirmed adenocarcinoma. -Due to the tumor invasion to portal vein and SMV, I recommended neoadjuvant chemotherapy FOLFIRINOX, she started on 08/05/2023 -restaging CT 11/05/2023 showed stable disease  - Patient was seen by pancreatobiliary surgeon Dr. Dasie, who also consulted her colleagues at [facility], and they recommend a total of 6 months neoadjuvant chemotherapy.  Will change her chemo to second line gemcitabine  and Abraxane  for additional 3 months. She started on 12/02/2023  Assessment & Plan Pancreatic cancer Pancreatic cancer under treatment with chemotherapy. Hemoglobin is well-managed at 9.3, no need for blood transfusion. Liver and kidney functions are normal. Scheduled for cycle three of chemotherapy today, with treatment today and next week, followed by  a week off. CT scan to be repeated after cycle four, likely in late August. - Administer chemotherapy today and next week - Order CT scan for late August - Schedule follow-up with Doctor Dasie after CT scan  Chemotherapy-induced peripheral neuropathy Mild peripheral neuropathy affecting fingers and toes, primarily noticeable at night. No pain or functional impairment reported. Mild decrease in sensation noted. She is taking B12 supplements. - Continue B12 supplementation - Reduce Paclitaxel  dose slightly - Encourage staying active and exercising  Plan - She is clinically stable, tolerating chemotherapy well overall - Lab reviewed, adequate for treatment, will proceed cycle 3-day 1 chemo today with slight dose reduction of Abraxane  to 80 mg/m due to her neuropathy - She will return next week for day 8 treatment - Follow-up in 3 weeks.  Plan to repeat a CT scan in 5 weeks   SUMMARY OF ONCOLOGIC HISTORY: Oncology History  Ductal carcinoma in situ (DCIS) of right breast  01/28/2019 Mammogram   Diagnostic Mammogram 01/28/19  IMPRESSION: Right breast retroareolar 7 mm grouped pleomorphic calcifications are suspicious.   02/02/2019 Initial Biopsy   Diagnosis 02/02/19 Breast, right, needle core biopsy, outer retroareolar - DUCTAL CARCINOMA IN SITU WITH CALCIFICATIONS. Microscopic Comment The ductal carcinoma in situ is intermediate grade. Estrogen and progesterone receptors will be performed.  Results: IMMUNOHISTOCHEMICAL AND MORPHOMETRIC ANALYSIS PERFORMED MANUALLY Estrogen Receptor: 90%, POSITIVE, STRONG STAINING INTENSITY Progesterone Receptor: 20%, POSITIVE, STRONG STAINING INTENSITY    02/02/2019 Cancer Staging   Staging form: Breast, AJCC 8th Edition - Clinical stage from 02/02/2019: Stage 0 (cTis (DCIS), cN0, cM0, G2, ER+, PR+, HER2: Not Assessed) - Signed by [name], MD on 02/10/2019   02/04/2019 Initial Diagnosis   Ductal carcinoma in situ (DCIS)  of right breast   03/18/2019  Surgery   RIGHT BREAST LUMPECTOMY WITH RADIOACTIVE SEED LOCALIZATION by Dr. Curvin 03/18/19   03/18/2019 Pathology Results   FINAL MICROSCOPIC DIAGNOSIS: 03/18/19 A. BREAST, RIGHT, LUMPECTOMY:  - Ductal carcinoma in situ with calcifications and necrosis, 1.4 cm.  - Ductal carcinoma in situ focally 0.1 cm from medial and anterior  lumpectomy margins.  - Ductal carcinoma in situ involves the final anterior margin (See part  D).  - Ductal carcinoma in situ focally 0.3 cm from superior lumpectomy  margin.  - Biopsy site and biopsy clip.   B. BREAST, RIGHT LATERAL MARGIN, EXCISION:  - Benign breast tissue.  - No ductal carcinoma in situ.  - Final lateral margin greater than 1 cm.   C. BREAST, RIGHT MEDIAL MARGIN, EXCISION:  - Ductal carcinoma in situ, 0.3 cm.  - Ductal carcinoma in situ with focally 0.3 cm from final medial margin.   D. BREAST, RIGHT ANTERIOR MARGIN, EXCISION:  - Ductal carcinoma in situ, 1.5 cm.  - Ductal carcinoma in situ focally involves final anterior margin.   E. BREAST, RIGHT DEEP MARGIN, EXCISION:  - Fibrocystic changes.  - No ductal carcinoma in situ.  - Final posterior margin greater than 0.7 cm.    04/08/2019 Surgery   RE-EXCISION OF RIGHT BREAST ANTERIOR MARGINS by Dr Curvin 04/08/19   04/08/2019 Pathology Results    DIAGNOSIS: 04/08/19  A. BREAST, RIGHT, ANTERIOR SUPERIOR, EXCISION:  - Intermediate grade ductal carcinoma in situ with necrosis.  - In situ carcinoma is <1 mm (not on ink) from the new anterior superior  margin, multifocally.  - Intraductal papilloma.  - Fibrocystic change.   B. BREAST, RIGHT, ANTERIOR INFERIOR, EXCISION:  - Benign breast tissue with resection changes.   C. BREAST, RIGHT, MEDIAL, EXCISION:  - Benign breast tissue with resection changes.  - Fibroadenomatoid and fibrocystic changes.   05/06/2019 Mammogram   Right Mammogram 05/06/19 IMPRESSION: Suspicious finding with 2 groups of microcalcifications in the medial  right breast. The anterior group measures 2 x 2 x 2 mm. The more posterior group measures 4 x 3 x 4 mm.   05/12/2019 Pathology Results   Diagnosis 05/12/19 1. Breast, right, needle core biopsy, medial - DUCTAL CARCINOMA IN SITU WITH CALCIFICATIONS. - SEE MICROSCOPIC DESCRIPTION. 2. Breast, right, needle core biopsy, medial - DUCTAL CARCINOMA IN SITU WITH CALCIFICATIONS. - SEE MICROSCOPIC DESCRIPTION. Results: IMMUNOHISTOCHEMICAL AND MORPHOMETRIC ANALYSIS PERFORMED MANUALLY Estrogen Receptor: 100%, POSITIVE, STRONG STAINING INTENSITY Progesterone Receptor: 0%, NEGATIVE COMMENT: The negative hormone receptor study(ies) in this case has an internal positive control. Results: IMMUNOHISTOCHEMICAL AND MORPHOMETRIC ANALYSIS PERFORMED MANUALLY Estrogen Receptor: 100%, POSITIVE, STRONG STAINING INTENSITY Progesterone Receptor: 0%, NEGATIVE COMMENT: The negative hormone receptor study(ies) in this case has an internal positive control.   07/26/2019 Surgery   RIGHT MASTECTOMY WITH SENTINEL NODE BIOPSY by Dr. Curvin 07/26/19   07/26/2019 Pathology Results   FINAL MICROSCOPIC DIAGNOSIS: 07/26/19  A. LYMPH NODE, RIGHT #1, SENTINEL,  BIOPSY:  -  No carcinoma identified in one lymph node (0/1)   B. BREAST, RIGHT, MASTECTOMY:  -  Ductal carcinoma in situ, intermediate grade, 0.6 cm  -  Margins uninvolved by carcinoma (greater than 1 cm; posterior margin)  -  Previous biopsy site changes (x2)  -  See oncology table below   07/26/2019 Cancer Staging   Staging form: Breast, AJCC 8th Edition - Pathologic stage from 07/26/2019: Stage 0 (pTis (DCIS), pN0, cM0, G2, ER+, PR+, HER2: Not Assessed) - Signed by  [name], MD on 08/10/2019   08/15/2023 Genetic Testing   Negative [facility] CancerNext-Expanded +RNAinsight Panel.  VUS in ATM at c.4910-891G>A and SDHA at c.-1C>G.  Report date is 08/15/2023.   The CancerNext-Expanded gene panel offered by [facility] and includes sequencing, rearrangement, and RNA analysis  for the following 76 genes: AIP, ALK, APC, ATM, AXIN2, BAP1, BARD1, BMPR1A, BRCA1, BRCA2, BRIP1, CDC73, CDH1, CDK4, CDKN1B, CDKN2A, CEBPA, CHEK2, CTNNA1, DDX41, DICER1, ETV6, FH, FLCN, GATA2, LZTR1, MAX, MBD4, MEN1, MET, MLH1, MSH2, MSH3, MSH6, MUTYH, NF1, NF2, NTHL1, PALB2, PHOX2B, PMS2, POT1, PRKAR1A, PTCH1, PTEN, RAD51C, RAD51D, RB1, RET, RUNX1, SDHA, SDHAF2, SDHB, SDHC, SDHD, SMAD4, SMARCA4, SMARCB1, SMARCE1, STK11, SUFU, TMEM127, TP53, TSC1, TSC2, VHL, and WT1 (sequencing and deletion/duplication); EGFR, HOXB13, KIT, MITF, PDGFRA, POLD1, and POLE (sequencing only); EPCAM and GREM1 (deletion/duplication only).    Pancreatic adenocarcinoma (HCC)  07/07/2022 Cancer Staging   Staging form: Exocrine Pancreas, AJCC 8th Edition - Clinical stage from 07/07/2022: Stage IB (cT2, cN0, cM0) - Signed by [name], MD on 07/22/2023 Stage prefix: Initial diagnosis Total positive nodes: 0   06/13/2023 Initial Diagnosis   Pancreatic adenocarcinoma (HCC)   08/06/2023 - 11/13/2023 Chemotherapy   Patient is on Treatment Plan : PANCREAS Modified FOLFIRINOX q14d x 4 cycles     08/15/2023 Genetic Testing   Negative [facility] CancerNext-Expanded +RNAinsight Panel.  VUS in ATM at c.4910-891G>A and SDHA at c.-1C>G.  Report date is 08/15/2023.   The CancerNext-Expanded gene panel offered by [facility] and includes sequencing, rearrangement, and RNA analysis for the following 76 genes: AIP, ALK, APC, ATM, AXIN2, BAP1, BARD1, BMPR1A, BRCA1, BRCA2, BRIP1, CDC73, CDH1, CDK4, CDKN1B, CDKN2A, CEBPA, CHEK2, CTNNA1, DDX41, DICER1, ETV6, FH, FLCN, GATA2, LZTR1, MAX, MBD4, MEN1, MET, MLH1, MSH2, MSH3, MSH6, MUTYH, NF1, NF2, NTHL1, PALB2, PHOX2B, PMS2, POT1, PRKAR1A, PTCH1, PTEN, RAD51C, RAD51D, RB1, RET, RUNX1, SDHA, SDHAF2, SDHB, SDHC, SDHD, SMAD4, SMARCA4, SMARCB1, SMARCE1, STK11, SUFU, TMEM127, TP53, TSC1, TSC2, VHL, and WT1 (sequencing and deletion/duplication); EGFR, HOXB13, KIT, MITF, PDGFRA, POLD1, and POLE (sequencing only);  EPCAM and GREM1 (deletion/duplication only).    12/02/2023 -  Chemotherapy   Patient is on Treatment Plan : PANCREATIC Abraxane  D1,8 + Gemcitabine  D1,8 q21d        Discussed the use of AI scribe software for clinical note transcription with the patient, who gave verbal consent to proceed.  History of Present Illness Adrienne [name] is a 78 year old female with pancreatic cancer who presents for follow-up.  She is undergoing her third cycle of chemotherapy without issues in the last cycle. A blood transfusion two to three weeks ago improved her condition, and her hemoglobin is 9.3. Her energy levels are satisfactory for normal daily activities.  She experiences nocturnal numbness in her fingers and toes without pain or difficulty with tasks. She takes B12 supplements, though the strength is unspecified.  She is not taking medication for nausea and has no allergies to CT contrast. No medication refills are needed.     All other systems were reviewed with the patient and are negative.  MEDICAL HISTORY:  Past Medical History:  Diagnosis Date   Allergy    Ambulates with cane    straight cane   Arthritis    knee, back   CHF (congestive heart failure) (HCC)    Ductal carcinoma in situ of breast 12/2018   R Breast-mastectomy only   Gout    History of blood transfusion 1986   w/ Hysterectomy surgery   Hyperlipidemia  diet controlled - no meds   Hypertension    Hypothyroidism    Pelvic kidney    lower right pelvic kidney    SBO (small bowel obstruction) (HCC) 10/2016   surgery    Sleep apnea    Mild - no mask needed per sleep study   Smoker    quit smoking 2018   Type 2 diabetes mellitus (HCC)     SURGICAL HISTORY: Past Surgical History:  Procedure Laterality Date   ABDOMINAL HYSTERECTOMY  1986   COMPLETE-precancerous   APPENDECTOMY     pt states it was removed when gallbladder was removed.   AUGMENTATION MAMMAPLASTY     BACK SURGERY     lower back   BILIARY  BRUSHING  07/08/2023   Procedure: BILIARY BRUSHING;  Surgeon: Wilhelmenia Aloha Raddle., MD;  Location: [facility];  Service: Gastroenterology;;   BILIARY STENT PLACEMENT N/A 07/08/2023   Procedure: BILIARY STENT PLACEMENT;  Surgeon: Wilhelmenia Aloha Raddle., MD;  Location: [facility];  Service: Gastroenterology;  Laterality: N/A;   BIOPSY  06/14/2023   Procedure: BIOPSY;  Surgeon: Avram Lupita BRAVO, MD;  Location: [facility];  Service: Gastroenterology;;   BIOPSY  07/08/2023   Procedure: BIOPSY;  Surgeon: Wilhelmenia Aloha Raddle., MD;  Location: [facility];  Service: Gastroenterology;;   BREAST BIOPSY Right 05/12/2019   times 2   BREAST LUMPECTOMY WITH RADIOACTIVE SEED LOCALIZATION Right 03/18/2019   Procedure: RIGHT BREAST LUMPECTOMY WITH RADIOACTIVE SEED LOCALIZATION;  Surgeon: [name], MD;  Location: [facility];  Service: General;  Laterality: Right;   CHOLECYSTECTOMY     COLONOSCOPY  02/15/2008   Kaplan    COLONOSCOPY WITH PROPOFOL   02/27/2018   Dr.Nandigam   ECTOPIC PREGNANCY SURGERY     ERCP N/A 06/14/2023   Procedure: ENDOSCOPIC RETROGRADE CHOLANGIOPANCREATOGRAPHY (ERCP);  Surgeon: Avram Lupita BRAVO, MD;  Location: [facility];  Service: Gastroenterology;  Laterality: N/A;   ERCP N/A 06/27/2023   Procedure: ENDOSCOPIC RETROGRADE CHOLANGIOPANCREATOGRAPHY (ERCP);  Surgeon: Charlanne Groom, MD;  Location: [facility];  Service: Gastroenterology;  Laterality: N/A;   ERCP N/A 07/08/2023   Procedure: ENDOSCOPIC RETROGRADE CHOLANGIOPANCREATOGRAPHY (ERCP);  Surgeon: Wilhelmenia Aloha Raddle., MD;  Location: [facility];  Service: Gastroenterology;  Laterality: N/A;   ESOPHAGOGASTRODUODENOSCOPY N/A 07/08/2023   Procedure: ESOPHAGOGASTRODUODENOSCOPY (EGD);  Surgeon: Wilhelmenia Aloha Raddle., MD;  Location: [facility];  Service: Gastroenterology;  Laterality: N/A;   ESOPHAGOGASTRODUODENOSCOPY (EGD) WITH PROPOFOL  N/A 06/14/2023   Procedure: ESOPHAGOGASTRODUODENOSCOPY (EGD) WITH PROPOFOL ;  Surgeon:  Avram Lupita BRAVO, MD;  Location: [facility];  Service: Gastroenterology;  Laterality: N/A;   EUS N/A 07/08/2023   Procedure: UPPER ENDOSCOPIC ULTRASOUND (EUS) LINEAR;  Surgeon: Wilhelmenia Aloha Raddle., MD;  Location: [facility];  Service: Gastroenterology;  Laterality: N/A;   FINE NEEDLE ASPIRATION N/A 07/08/2023   Procedure: FINE NEEDLE ASPIRATION (FNA) LINEAR;  Surgeon: Wilhelmenia Aloha Raddle., MD;  Location: [facility];  Service: Gastroenterology;  Laterality: N/A;   IR IMAGING GUIDED PORT INSERTION  07/29/2023   JOINT REPLACEMENT     Left hip total Dr. Melodi 10-01-17   MALONEY DILATION  06/14/2023   Procedure: AGAPITO DILATION;  Surgeon: Avram Lupita BRAVO, MD;  Location: [facility];  Service: Gastroenterology;;   MASTECTOMY Right 07/26/2019   PANCREATIC STENT PLACEMENT  06/14/2023   Procedure: PANCREATIC STENT PLACEMENT;  Surgeon: Avram Lupita BRAVO, MD;  Location: [facility];  Service: Gastroenterology;;   PANCREATIC STENT PLACEMENT  06/27/2023   Procedure: PANCREATIC STENT PLACEMENT;  Surgeon: Charlanne Groom, MD;  Location: [facility];  Service:  Gastroenterology;;   POLYPECTOMY     polypectomy-oropharynx     RE-EXCISION OF BREAST CANCER,SUPERIOR MARGINS Right 04/08/2019   Procedure: RE-EXCISION OF RIGHT BREAST ANTERIOR MARGINS;  Surgeon: Curvin Mt III, MD;  Location: [facility];  Service: General;  Laterality: Right;   right knee meniscus     SBO  10/2016   small bowel obstruction   SIMPLE MASTECTOMY WITH AXILLARY SENTINEL NODE BIOPSY Right 07/26/2019   Procedure: RIGHT MASTECTOMY WITH SENTINEL NODE BIOPSY;  Surgeon: Curvin Mt MOULD, MD;  Location: [facility];  Service: General;  Laterality: Right;   SPHINCTEROTOMY  06/27/2023   Procedure: SPHINCTEROTOMY;  Surgeon: Charlanne Groom, MD;  Location: [facility];  Service: Gastroenterology;;   ANNETT  07/08/2023   Procedure: ANNETT;  Surgeon: Wilhelmenia Aloha Raddle., MD;  Location: [facility];  Service:  Gastroenterology;;   TOTAL HIP ARTHROPLASTY Left 10/01/2017   Procedure: LEFT  TOTAL HIP ARTHROPLASTY ANTERIOR APPROACH;  Surgeon: Melodi Lerner, MD;  Location: [facility];  Service: Orthopedics;  Laterality: Left;   TOTAL HIP ARTHROPLASTY Right 03/11/2018   Procedure: RIGHT TOTAL HIP ARTHROPLASTY ANTERIOR APPROACH;  Surgeon: Melodi Lerner, MD;  Location: [facility];  Service: Orthopedics;  Laterality: Right;    UPPER GASTROINTESTINAL ENDOSCOPY      I have reviewed the social history and family history with the patient and they are unchanged from previous note.  ALLERGIES:  is allergic to lisinopril , oxaliplatin , statins, and atorvastatin .  MEDICATIONS:  Current Outpatient Medications  Medication Sig Dispense Refill   Cholecalciferol (VITAMIN D -3) 125 MCG (5000 UT) TABS Take 1 tablet by mouth daily.     famotidine  (PEPCID ) 20 MG tablet Take 1 tablet (20 mg total) by mouth 2 (two) times daily. (Patient taking differently: Take 20 mg by mouth daily.) 30 tablet 1   fluticasone  (FLONASE ) 50 MCG/ACT nasal spray Place 2 sprays into both nostrils daily. 16 g 6   Glucose Blood (BLOOD GLUCOSE TEST STRIPS) STRP 1 each by In Vitro route in the morning, at noon, and at bedtime. May substitute to any manufacturer covered by patient's insurance. 100 strip 5   insulin  aspart (NOVOLOG ) 100 UNIT/ML FlexPen Inject 0-6 Units into the skin 3 (three) times daily with meals. Check Blood Glucose (BG) and inject per scale: BG <150= 0 unit; BG 150-200= 1 unit; BG 201-250= 2 unit; BG 251-300= 3 unit; BG 301-350= 4 unit; BG 351-400= 5 unit; BG >400= 6 unit and Call Primary Care. 15 mL 0   Insulin  Glargine (BASAGLAR  KWIKPEN) 100 UNIT/ML Inject 12 Units into the skin daily. May substitute as needed per insurance. (Patient taking differently: Inject 12 Units into the skin at bedtime.) 15 mL 2   Lancets (ONETOUCH DELICA PLUS LANCET33G) MISC 1 each by Does not apply route 3 (three) times daily. 100 each 0    lidocaine -prilocaine  (EMLA ) cream Apply 1 Application topically as needed. 30 g 2   losartan  (COZAAR ) 100 MG tablet Take 1 tablet (100 mg total) by mouth daily. 90 tablet 1   NIFEdipine  (ADALAT  CC) 60 MG 24 hr tablet Take 1 tablet (60 mg total) by mouth daily. 90 tablet 1   ondansetron  (ZOFRAN ) 8 MG tablet Take 1 tablet (8 mg total) by mouth every 8 (eight) hours as needed for nausea or vomiting. 20 tablet 1   potassium chloride  (KLOR-CON ) 10 MEQ tablet Take 1 tablet (10 mEq total) by mouth 3 (three) times daily for 7 days, THEN 1 tablet (10 mEq total) daily for 23 days. 44  tablet 0   predniSONE  (STERAPRED UNI-PAK 21 TAB) 10 MG (21) TBPK tablet Use as directed. 21 each 0   prochlorperazine  (COMPAZINE ) 10 MG tablet Take 1 tablet (10 mg total) by mouth every 6 (six) hours as needed for nausea or vomiting. 30 tablet 2   RELION PEN NEEDLES 31G X 6 MM MISC USE 1  THREE TIMES DAILY 100 each 0   thyroid  (NP THYROID ) 90 MG tablet Take 1 tablet (90 mg total) by mouth daily. 30 tablet 5   No current facility-administered medications for this visit.   Facility-Administered Medications Ordered in Other Visits  Medication Dose Route Frequency Provider Last Rate Last Admin   0.9 %  sodium chloride  infusion   Intravenous Continuous [name], MD   Stopped at 01/13/24 1306   sodium chloride  flush (NS) 0.9 % injection 10 mL  10 mL Intracatheter PRN [name], MD   10 mL at 01/13/24 1307    PHYSICAL EXAMINATION: ECOG PERFORMANCE STATUS: 1 - Symptomatic but completely ambulatory  Vitals:   01/13/24 1003 01/13/24 1004  BP: (!) 150/68 (!) 152/72  Pulse: 65   Resp: 16   Temp: (!) 97.3 F (36.3 C)   SpO2: 98%    Wt Readings from Last 3 Encounters:  01/13/24 149 lb 6.4 oz (67.8 kg)  12/30/23 148 lb 1.6 oz (67.2 kg)  12/23/23 151 lb 6.4 oz (68.7 kg)     GENERAL:alert, no distress and comfortable SKIN: skin color, texture, turgor are normal, no rashes or significant lesions EYES: normal, Conjunctiva are  pink and non-injected, sclera clear NECK: supple, thyroid  normal size, non-tender, without nodularity LYMPH:  no palpable lymphadenopathy in the cervical, axillary  LUNGS: clear to auscultation and percussion with normal breathing effort HEART: regular rate & rhythm and no murmurs and no lower extremity edema ABDOMEN:abdomen soft, non-tender and normal bowel sounds Musculoskeletal:no cyanosis of digits and no clubbing  NEURO: alert & oriented x 3 with fluent speech, no focal motor/sensory deficits except mild vibration sensation loss in both hands  Physical Exam   LABORATORY DATA:  I have reviewed the data as listed    Latest Ref Rng & Units 01/13/2024    9:40 AM 12/30/2023    9:37 AM 12/23/2023    9:36 AM  CBC  WBC 4.0 - 10.5 K/uL 6.6  3.8  5.6   Hemoglobin 12.0 - 15.0 g/dL 9.3  89.9  7.1   Hematocrit 36.0 - 46.0 % 27.8  30.1  21.7   Platelets 150 - 400 K/uL 265  279  239         Latest Ref Rng & Units 01/13/2024    9:40 AM 12/30/2023    9:37 AM 12/23/2023    9:36 AM  CMP  Glucose 70 - 99 mg/dL 867  99  872   BUN 8 - 23 mg/dL 12  18  17    Creatinine 0.44 - 1.00 mg/dL 9.03  8.92  8.68   Sodium 135 - 145 mmol/L 140  138  140   Potassium 3.5 - 5.1 mmol/L 3.9  4.0  4.2   Chloride 98 - 111 mmol/L 107  106  107   CO2 22 - 32 mmol/L 25  27  26    Calcium  8.9 - 10.3 mg/dL 8.7  8.7  8.2   Total Protein 6.5 - 8.1 g/dL 6.9  6.7  6.5   Total Bilirubin 0.0 - 1.2 mg/dL 0.3  0.3  0.3   Alkaline Phos 38 - 126  U/L 98  102  102   AST 15 - 41 U/L 22  31  19    ALT 0 - 44 U/L 15  20  13        RADIOGRAPHIC STUDIES: I have personally reviewed the radiological images as listed and agreed with the findings in the report. No results found.    Orders Placed This Encounter  Procedures   CT ABD PELVIS W/WO CM ONCOLOGY PANCREATIC PROTOCOL    Standing Status:   Future    Expected Date:   02/17/2024    Expiration Date:   01/12/2025    If indicated for the ordered procedure, I authorize the  administration of contrast media per Radiology protocol:   Yes    Does the patient have a contrast media/X-ray dye allergy?:   No    Preferred imaging location?:   [facility]    If indicated for the ordered procedure, I authorize the administration of oral contrast media per Radiology protocol:   Yes   All questions were answered. The patient knows to call the clinic with any problems, questions or concerns. No barriers to learning was detected. The total time spent in the appointment was 30 minutes, including review of chart and various tests results, discussions about plan of care and coordination of care plan     Onita Mattock, MD 01/13/2024

## 2024-01-15 ENCOUNTER — Telehealth: Payer: Self-pay | Admitting: Hematology

## 2024-01-15 NOTE — Telephone Encounter (Signed)
 Scheduled appointments per WQ. Talked with the patients daughter and she is aware of the made appointments for the patient.

## 2024-01-18 ENCOUNTER — Emergency Department (HOSPITAL_COMMUNITY)

## 2024-01-18 ENCOUNTER — Other Ambulatory Visit: Payer: Self-pay

## 2024-01-18 ENCOUNTER — Emergency Department (HOSPITAL_COMMUNITY)
Admission: EM | Admit: 2024-01-18 | Discharge: 2024-01-18 | Disposition: A | Discharge status: Home / Self Care | Attending: Emergency Medicine | Admitting: Emergency Medicine

## 2024-01-18 DIAGNOSIS — I110 Hypertensive heart disease with heart failure: Secondary | ICD-10-CM | POA: Diagnosis not present

## 2024-01-18 DIAGNOSIS — R531 Weakness: Secondary | ICD-10-CM | POA: Diagnosis not present

## 2024-01-18 DIAGNOSIS — E039 Hypothyroidism, unspecified: Secondary | ICD-10-CM | POA: Insufficient documentation

## 2024-01-18 DIAGNOSIS — R519 Headache, unspecified: Secondary | ICD-10-CM | POA: Diagnosis not present

## 2024-01-18 DIAGNOSIS — R112 Nausea with vomiting, unspecified: Secondary | ICD-10-CM | POA: Diagnosis not present

## 2024-01-18 DIAGNOSIS — F17200 Nicotine dependence, unspecified, uncomplicated: Secondary | ICD-10-CM | POA: Insufficient documentation

## 2024-01-18 DIAGNOSIS — E860 Dehydration: Secondary | ICD-10-CM | POA: Diagnosis not present

## 2024-01-18 DIAGNOSIS — Z794 Long term (current) use of insulin: Secondary | ICD-10-CM | POA: Diagnosis not present

## 2024-01-18 DIAGNOSIS — R42 Dizziness and giddiness: Secondary | ICD-10-CM

## 2024-01-18 DIAGNOSIS — Z79899 Other long term (current) drug therapy: Secondary | ICD-10-CM | POA: Insufficient documentation

## 2024-01-18 DIAGNOSIS — I509 Heart failure, unspecified: Secondary | ICD-10-CM | POA: Insufficient documentation

## 2024-01-18 DIAGNOSIS — I6782 Cerebral ischemia: Secondary | ICD-10-CM | POA: Diagnosis not present

## 2024-01-18 DIAGNOSIS — E119 Type 2 diabetes mellitus without complications: Secondary | ICD-10-CM | POA: Insufficient documentation

## 2024-01-18 LAB — CBC
HCT: 26 % — ABNORMAL LOW (ref 36.0–46.0)
Hemoglobin: 8.6 g/dL — ABNORMAL LOW (ref 12.0–15.0)
MCH: 32.8 pg (ref 26.0–34.0)
MCHC: 33.1 g/dL (ref 30.0–36.0)
MCV: 99.2 fL (ref 80.0–100.0)
Platelets: 372 K/uL (ref 150–400)
RBC: 2.62 MIL/uL — ABNORMAL LOW (ref 3.87–5.11)
RDW: 18.5 % — ABNORMAL HIGH (ref 11.5–15.5)
WBC: 5 K/uL (ref 4.0–10.5)
nRBC: 0 % (ref 0.0–0.2)

## 2024-01-18 LAB — COMPREHENSIVE METABOLIC PANEL WITH GFR
ALT: 22 U/L (ref 0–44)
AST: 35 U/L (ref 15–41)
Albumin: 3.3 g/dL — ABNORMAL LOW (ref 3.5–5.0)
Alkaline Phosphatase: 91 U/L (ref 38–126)
Anion gap: 7 (ref 5–15)
BUN: 15 mg/dL (ref 8–23)
CO2: 24 mmol/L (ref 22–32)
Calcium: 8.7 mg/dL — ABNORMAL LOW (ref 8.9–10.3)
Chloride: 106 mmol/L (ref 98–111)
Creatinine, Ser: 0.86 mg/dL (ref 0.44–1.00)
GFR, Estimated: >60 mL/min (ref >60)
Glucose, Bld: 122 mg/dL — ABNORMAL HIGH (ref 70–99)
Potassium: 4 mmol/L (ref 3.5–5.1)
Sodium: 137 mmol/L (ref 135–145)
Total Bilirubin: 0.4 mg/dL (ref 0.0–1.2)
Total Protein: 7 g/dL (ref 6.5–8.1)

## 2024-01-18 LAB — LIPASE, BLOOD: Lipase: 19 U/L (ref 11–51)

## 2024-01-18 MED ORDER — SODIUM CHLORIDE 0.9 % IV BOLUS
500.0000 mL | Freq: Once | INTRAVENOUS | Status: AC
  Administered 2024-01-18: 500 mL via INTRAVENOUS

## 2024-01-18 MED ORDER — HEPARIN SOD (PORK) LOCK FLUSH 100 UNIT/ML IV SOLN
INTRAVENOUS | Status: AC
  Administered 2024-01-18: 500 [IU] via INTRAVENOUS
  Filled 2024-01-18: qty 5

## 2024-01-18 MED ORDER — MECLIZINE HCL 12.5 MG PO TABS
25.0000 mg | ORAL_TABLET | Freq: Once | ORAL | Status: AC
Start: 2024-01-18 — End: 2024-01-18
  Administered 2024-01-18: 25 mg via ORAL
  Filled 2024-01-18: qty 2

## 2024-01-18 NOTE — ED Triage Notes (Signed)
 Pt comes in for n/v and dizziness started a couple hrs ago. Pt is not sure how many time she has thrown up this morning. Pt has a hx of pancreatic cancer and last txt was on Tuesday. Last accessed port on Tuesday. Pt is A&Ox4.

## 2024-01-18 NOTE — ED Notes (Signed)
 Ambulated patient in the hallway for approximately 50 feet. Pt tolerated well, without dizziness or nausea.

## 2024-01-18 NOTE — Discharge Instructions (Signed)
 Your symptoms resolved.  You were able to walk around the emergency department without any symptoms.  Follow-up with your oncologist.  Increase your fluid intake.  Return for any concerning symptoms.

## 2024-01-18 NOTE — ED Notes (Signed)
 Pt up to bedside toilet in NAD. Family remains at the bedside. IVF continue to infuse, approx 200 cc left to infuse.

## 2024-01-18 NOTE — ED Provider Notes (Signed)
 [place] EMERGENCY DEPARTMENT AT [facility] Provider Note   CSN: [id number] Arrival date & time: 01/18/24  1328     Patient presents with: Dizziness, Emesis, and Nausea   Gaylia Kassel is a 78 y.o. female.   78 year old female presents today for concern of nausea, and dizziness.  She has history of pancreatic cancer and is undergoing current treatment.  She denies any headache, vision change.  She was given some Zofran  and route by EMS and reports improvement in symptoms.  Currently does not feel dizzy.  Endorses decreased fluid intake particularly yesterday.  The history is provided by the patient. No language interpreter was used.       Prior to Admission medications   Medication Sig Start Date End Date Taking? Authorizing Provider  Cholecalciferol (VITAMIN D -3) 125 MCG (5000 UT) TABS Take 1 tablet by mouth daily.    [provider]  famotidine  (PEPCID ) 20 MG tablet Take 1 tablet (20 mg total) by mouth 2 (two) times daily. Patient taking differently: Take 20 mg by mouth daily. 05/15/23   Soldatova, Liuba, MD  fluticasone  (FLONASE ) 50 MCG/ACT nasal spray Place 2 sprays into both nostrils daily. 12/19/23   Soldatova, Liuba, MD  Glucose Blood (BLOOD GLUCOSE TEST STRIPS) STRP 1 each by In Vitro route in the morning, at noon, and at bedtime. May substitute to any manufacturer covered by patient's insurance. 07/22/23   [name], MD  insulin  aspart (NOVOLOG ) 100 UNIT/ML FlexPen Inject 0-6 Units into the skin 3 (three) times daily with meals. Check Blood Glucose (BG) and inject per scale: BG <150= 0 unit; BG 150-200= 1 unit; BG 201-250= 2 unit; BG 251-300= 3 unit; BG 301-350= 4 unit; BG 351-400= 5 unit; BG >400= 6 unit and Call Primary Care. 06/28/23   Rai, Nydia POUR, MD  Insulin  Glargine (BASAGLAR  KWIKPEN) 100 UNIT/ML Inject 12 Units into the skin daily. May substitute as needed per insurance. Patient taking differently: Inject 12 Units into the skin at  bedtime. 06/28/23   Rai, Nydia POUR, MD  Lancets (ONETOUCH DELICA PLUS [place]) MISC 1 each by Does not apply route 3 (three) times daily. 09/01/23   [name], MD  lidocaine -prilocaine  (EMLA ) cream Apply 1 Application topically as needed. 07/23/23   [name], MD  losartan  (COZAAR ) 100 MG tablet Take 1 tablet (100 mg total) by mouth daily. 10/21/23   [name], MD  NIFEdipine  (ADALAT  CC) 60 MG 24 hr tablet Take 1 tablet (60 mg total) by mouth daily. 10/21/23   [name], MD  ondansetron  (ZOFRAN ) 8 MG tablet Take 1 tablet (8 mg total) by mouth every 8 (eight) hours as needed for nausea or vomiting. 07/23/23   [name], MD  potassium chloride  (KLOR-CON ) 10 MEQ tablet Take 1 tablet (10 mEq total) by mouth 3 (three) times daily for 7 days, THEN 1 tablet (10 mEq total) daily for 23 days. 09/30/23 12/30/23  [name], MD  predniSONE  (STERAPRED UNI-PAK 21 TAB) 10 MG (21) TBPK tablet Use as directed. 11/25/23   Melvenia Manus BRAVO, MD  prochlorperazine  (COMPAZINE ) 10 MG tablet Take 1 tablet (10 mg total) by mouth every 6 (six) hours as needed for nausea or vomiting. 07/23/23   [name], MD  RELION PEN NEEDLES 31G X 6 MM MISC USE 1  THREE TIMES DAILY 11/26/23   [name], MD  thyroid  (NP THYROID ) 90 MG tablet Take 1 tablet (90 mg total) by mouth daily. 10/21/23  [name], MD    Allergies: Lisinopril , Oxaliplatin , Statins, and Atorvastatin     Review of Systems  Constitutional:  Negative for chills and fever.  Eyes:  Negative for visual disturbance.  Respiratory:  Negative for shortness of breath.   Cardiovascular:  Negative for chest pain.  Gastrointestinal:  Positive for nausea and vomiting. Negative for abdominal pain, constipation and diarrhea.  Genitourinary:  Negative for dysuria.  Neurological:  Negative for light-headedness and headaches.  All other systems reviewed and are negative.   Updated Vital Signs BP (!) 167/93 (BP Location: Right Arm)   Pulse 74   Temp (!) 97.4 F  (36.3 C) (Oral)   Resp 17   Ht 5' 4 (1.626 m)   Wt 67.6 kg   SpO2 98%   BMI 25.58 kg/m   Physical Exam Vitals and nursing note reviewed.  Constitutional:      General: She is not in acute distress.    Appearance: Normal appearance. She is not ill-appearing.  HENT:     Head: Normocephalic and atraumatic.     Nose: Nose normal.  Eyes:     General: No scleral icterus.    Extraocular Movements: Extraocular movements intact.     Conjunctiva/sclera: Conjunctivae normal.     Pupils: Pupils are equal, round, and reactive to light.  Cardiovascular:     Rate and Rhythm: Normal rate and regular rhythm.     Pulses: Normal pulses.     Heart sounds: Normal heart sounds.  Pulmonary:     Effort: Pulmonary effort is normal. No respiratory distress.     Breath sounds: Normal breath sounds. No wheezing or rales.  Abdominal:     General: There is no distension.     Palpations: Abdomen is soft.     Tenderness: There is no abdominal tenderness. There is no guarding.  Musculoskeletal:        General: Normal range of motion.     Cervical back: Normal range of motion.  Skin:    General: Skin is warm and dry.  Neurological:     General: No focal deficit present.     Mental Status: She is alert and oriented to person, place, and time. Mental status is at baseline.     Comments: Cranial nerves III through XII intact.  Full range of motion in bilateral upper and lower extremities with 5/5 strength in extensor and flexor muscle groups.  No pronator drift.  Normal speech.     (all labs ordered are listed, but only abnormal results are displayed) Labs Reviewed  COMPREHENSIVE METABOLIC PANEL WITH GFR - Abnormal; Notable for the following components:      Result Value   Glucose, Bld 122 (*)    Calcium  8.7 (*)    Albumin 3.3 (*)    All other components within normal limits  CBC - Abnormal; Notable for the following components:   RBC 2.62 (*)    Hemoglobin 8.6 (*)    HCT 26.0 (*)    RDW 18.5  (*)    All other components within normal limits  LIPASE, BLOOD  URINALYSIS, ROUTINE W REFLEX MICROSCOPIC    EKG: None  Radiology: CT Head Wo Contrast Result Date: 01/18/2024 CLINICAL DATA:  Headache, new onset (Age >= 51y). Nausea, vomiting, and dizziness. History of pancreatic cancer. EXAM: CT HEAD WITHOUT CONTRAST TECHNIQUE: Contiguous axial images were obtained from the base of the skull through the vertex without intravenous contrast. RADIATION DOSE REDUCTION: This exam was performed according to the  departmental dose-optimization program which includes automated exposure control, adjustment of the mA and/or kV according to patient size and/or use of iterative reconstruction technique. COMPARISON:  Head CT 10/29/2023 FINDINGS: Brain: There is no evidence of an acute infarct, intracranial hemorrhage, mass, midline shift, or extra-axial fluid collection. Patchy hypodensities in the cerebral white matter are unchanged and nonspecific but compatible with mild-to-moderate chronic small vessel ischemic disease. A chronic lacunar infarct in the left caudate head is unchanged. Mild cerebral atrophy is within normal limits for age. The ventricles are normal in size. Vascular: Calcified atherosclerosis at the skull base. No hyperdense vessel. Skull: No fracture or suspicious lesion. Sinuses/Orbits: Visualized paranasal sinuses and mastoid air cells are clear. Bilateral cataract extraction. Other: None. IMPRESSION: 1. No evidence of acute intracranial abnormality. 2. Mild-to-moderate chronic small vessel ischemic disease. Electronically Signed   By: Dasie Hamburg M.D.   On: 01/18/2024 15:14     Procedures   Medications Ordered in the ED  heparin  lock flush 100 UNIT/ML injection (has no administration in time range)  meclizine  (ANTIVERT ) tablet 25 mg (25 mg Oral Given 01/18/24 1452)  sodium chloride  0.9 % bolus 500 mL (500 mLs Intravenous Bolus 01/18/24 1452)                                     Medical Decision Making Amount and/or Complexity of Data Reviewed Labs: ordered. Radiology: ordered.   Medical Decision Making / ED Course   This patient presents to the ED for concern of dizziness, emesis, this involves an extensive number of treatment options, and is a complaint that carries with it a high risk of complications and morbidity.  The differential diagnosis includes vertigo, dehydration, orthostasis, metastasis  MDM: 78 year old female presents with dizziness, and nausea and emesis. History of pancreatic cancer. Symptoms resolved after getting Zofran  in the emergency department. Will obtain labs, provide half liter fluid bolus and reevaluate. Patient reports continued improvement. Labs reassuring.  Hemoglobin 8.6 this is consistent with her previous baseline.  She has been as low as 7.1 in the past month.  Renal function is preserved. Patient ambulated in the emergency department without any issues.  Denies any lightheadedness, dizziness. Family feels comfortable with discharge.  They will follow-up with the oncologist.  Discharged in stable condition.  Strict return precautions discussed. CT head obtained without acute intracranial process.    Additional history obtained: -Additional history obtained from family at bedside, chart review -External records from outside source obtained and reviewed including: Chart review including previous notes, labs, imaging, consultation notes   Lab Tests: -I ordered, reviewed, and interpreted labs.   The pertinent results include:   Labs Reviewed  COMPREHENSIVE METABOLIC PANEL WITH GFR - Abnormal; Notable for the following components:      Result Value   Glucose, Bld 122 (*)    Calcium  8.7 (*)    Albumin 3.3 (*)    All other components within normal limits  CBC - Abnormal; Notable for the following components:   RBC 2.62 (*)    Hemoglobin 8.6 (*)    HCT 26.0 (*)    RDW 18.5 (*)    All other components within normal  limits  LIPASE, BLOOD  URINALYSIS, ROUTINE W REFLEX MICROSCOPIC      EKG  EKG Interpretation Date/Time:    Ventricular Rate:    PR Interval:    QRS Duration:    QT Interval:  QTC Calculation:   R Axis:      Text Interpretation:           Imaging Studies ordered: I ordered imaging studies including CT head without contrast I independently visualized and interpreted imaging. I agree with the radiologist interpretation   Medicines ordered and prescription drug management: Meds ordered this encounter  Medications   meclizine  (ANTIVERT ) tablet 25 mg   sodium chloride  0.9 % bolus 500 mL   heparin  lock flush 100 UNIT/ML injection    Bassinger, Leigh Ann: cabinet override    -I have reviewed the patients home medicines and have made adjustments as needed     Reevaluation: After the interventions noted above, I reevaluated the patient and found that they have :resolved  Co morbidities that complicate the patient evaluation  Past Medical History:  Diagnosis Date   Allergy    Ambulates with cane    straight cane   Arthritis    knee, back   CHF (congestive heart failure) (HCC)    Ductal carcinoma in situ of breast 12/2018   R Breast-mastectomy only   Gout    History of blood transfusion 1986   w/ Hysterectomy surgery   Hyperlipidemia    diet controlled - no meds   Hypertension    Hypothyroidism    Pelvic kidney    lower right pelvic kidney    SBO (small bowel obstruction) (HCC) 10/2016   surgery    Sleep apnea    Mild - no mask needed per sleep study   Smoker    quit smoking 2018   Type 2 diabetes mellitus (HCC)       Dispostion: Discharged in stable condition.  Return precaution discussed.  Patient voices understanding and is in agreement with plan.   Final diagnoses:  Dehydration  Dizziness    ED Discharge Orders     None          Hildegard Loge, PA-C 01/18/24 1724    Charlyn Sora, MD 01/19/24 1547

## 2024-01-18 NOTE — ED Notes (Signed)
 IV to L AC d/c intact. Port access flushed with heparin  flush and then de-accessed. Pt tolerated both well with no evidence of bleeding.

## 2024-01-19 ENCOUNTER — Telehealth: Payer: Self-pay | Admitting: Hematology

## 2024-01-19 NOTE — Telephone Encounter (Signed)
 Called and rescheduled appointments per los. Talked with the patients daughter Luke and she is aware of the changes made to the patients appointments.

## 2024-01-20 ENCOUNTER — Ambulatory Visit: Admitting: Hematology

## 2024-01-20 ENCOUNTER — Inpatient Hospital Stay

## 2024-01-20 VITALS — BP 132/68 | HR 62 | Temp 97.6°F | Resp 16 | Wt 148.2 lb

## 2024-01-20 DIAGNOSIS — Z5111 Encounter for antineoplastic chemotherapy: Secondary | ICD-10-CM | POA: Diagnosis not present

## 2024-01-20 DIAGNOSIS — D6481 Anemia due to antineoplastic chemotherapy: Secondary | ICD-10-CM

## 2024-01-20 DIAGNOSIS — K8689 Other specified diseases of pancreas: Secondary | ICD-10-CM

## 2024-01-20 DIAGNOSIS — Z95828 Presence of other vascular implants and grafts: Secondary | ICD-10-CM

## 2024-01-20 DIAGNOSIS — C259 Malignant neoplasm of pancreas, unspecified: Secondary | ICD-10-CM

## 2024-01-20 DIAGNOSIS — D0511 Intraductal carcinoma in situ of right breast: Secondary | ICD-10-CM

## 2024-01-20 DIAGNOSIS — C250 Malignant neoplasm of head of pancreas: Secondary | ICD-10-CM

## 2024-01-20 LAB — CBC WITH DIFFERENTIAL (CANCER CENTER ONLY)
Abs Immature Granulocytes: 0.01 K/uL (ref 0.00–0.07)
Basophils Absolute: 0 K/uL (ref 0.0–0.1)
Basophils Relative: 1 %
Eosinophils Absolute: 0 K/uL (ref 0.0–0.5)
Eosinophils Relative: 1 %
HCT: 26 % — ABNORMAL LOW (ref 36.0–46.0)
Hemoglobin: 8.6 g/dL — ABNORMAL LOW (ref 12.0–15.0)
Immature Granulocytes: 0 %
Lymphocytes Relative: 51 %
Lymphs Abs: 2.2 K/uL (ref 0.7–4.0)
MCH: 32 pg (ref 26.0–34.0)
MCHC: 33.1 g/dL (ref 30.0–36.0)
MCV: 96.7 fL (ref 80.0–100.0)
Monocytes Absolute: 0.3 K/uL (ref 0.1–1.0)
Monocytes Relative: 7 %
Neutro Abs: 1.7 K/uL (ref 1.7–7.7)
Neutrophils Relative %: 40 %
Platelet Count: 360 K/uL (ref 150–400)
RBC: 2.69 MIL/uL — ABNORMAL LOW (ref 3.87–5.11)
RDW: 18.1 % — ABNORMAL HIGH (ref 11.5–15.5)
WBC Count: 4.2 K/uL (ref 4.0–10.5)
nRBC: 0 % (ref 0.0–0.2)

## 2024-01-20 LAB — CMP (CANCER CENTER ONLY)
ALT: 15 U/L (ref 0–44)
AST: 26 U/L (ref 15–41)
Albumin: 3.4 g/dL — ABNORMAL LOW (ref 3.5–5.0)
Alkaline Phosphatase: 97 U/L (ref 38–126)
Anion gap: 6 (ref 5–15)
BUN: 14 mg/dL (ref 8–23)
CO2: 26 mmol/L (ref 22–32)
Calcium: 8.7 mg/dL — ABNORMAL LOW (ref 8.9–10.3)
Chloride: 105 mmol/L (ref 98–111)
Creatinine: 1.01 mg/dL — ABNORMAL HIGH (ref 0.44–1.00)
GFR, Estimated: 58 mL/min — ABNORMAL LOW (ref >60)
Glucose, Bld: 136 mg/dL — ABNORMAL HIGH (ref 70–99)
Potassium: 3.9 mmol/L (ref 3.5–5.1)
Sodium: 137 mmol/L (ref 135–145)
Total Bilirubin: 0.2 mg/dL (ref 0.0–1.2)
Total Protein: 6.8 g/dL (ref 6.5–8.1)

## 2024-01-20 LAB — SAMPLE TO BLOOD BANK

## 2024-01-20 MED ORDER — PROCHLORPERAZINE MALEATE 10 MG PO TABS
10.0000 mg | ORAL_TABLET | Freq: Once | ORAL | Status: AC
  Administered 2024-01-20: 10 mg via ORAL
  Filled 2024-01-20: qty 1

## 2024-01-20 MED ORDER — SODIUM CHLORIDE 0.9 % IV SOLN: INTRAVENOUS | Status: DC

## 2024-01-20 MED ORDER — SODIUM CHLORIDE 0.9 % IV SOLN
1000.0000 mg/m2 | Freq: Once | INTRAVENOUS | Status: AC
  Administered 2024-01-20: 1786 mg via INTRAVENOUS
  Filled 2024-01-20: qty 46.97

## 2024-01-20 MED ORDER — PACLITAXEL PROTEIN-BOUND CHEMO INJECTION 100 MG
80.0000 mg/m2 | Freq: Once | INTRAVENOUS | Status: AC
  Administered 2024-01-20: 150 mg via INTRAVENOUS
  Filled 2024-01-20: qty 30

## 2024-01-20 MED ORDER — SODIUM CHLORIDE 0.9% FLUSH
10.0000 mL | INTRAVENOUS | Status: DC | PRN
  Administered 2024-01-20: 10 mL

## 2024-01-20 MED ORDER — HEPARIN SOD (PORK) LOCK FLUSH 100 UNIT/ML IV SOLN: 500.0000 [IU] | Freq: Once | INTRAVENOUS | Status: DC | PRN

## 2024-01-20 MED ORDER — SODIUM CHLORIDE 0.9% FLUSH
10.0000 mL | Freq: Once | INTRAVENOUS | Status: AC
Start: 2024-01-20 — End: 2024-01-20
  Administered 2024-01-20: 10 mL

## 2024-01-20 NOTE — Progress Notes (Signed)
 Patient was seen in the ER on Sunday patient says she feels treatment is appropriate. Discussed with Dr. Lanny and Lacie Burton via secure chat and was given permission to proceed with treatment today.

## 2024-01-22 ENCOUNTER — Ambulatory Visit: Payer: PPO

## 2024-01-22 VITALS — BP 148/77 | HR 63 | Temp 98.3°F | Resp 16 | Ht 63.5 in | Wt 144.6 lb

## 2024-01-22 DIAGNOSIS — E782 Mixed hyperlipidemia: Secondary | ICD-10-CM | POA: Diagnosis not present

## 2024-01-22 MED ORDER — INCLISIRAN SODIUM 284 MG/1.5ML ~~LOC~~ SOSY
284.0000 mg | PREFILLED_SYRINGE | Freq: Once | SUBCUTANEOUS | Status: AC
Start: 2024-01-22 — End: 2024-01-22
  Administered 2024-01-22: 284 mg via SUBCUTANEOUS
  Filled 2024-01-22: qty 1.5

## 2024-01-22 NOTE — Progress Notes (Signed)
 Diagnosis: Hyperlipidemia  Provider:  Chilton Greathouse MD  Procedure: Injection  Leqvio (inclisiran), Dose: 284 mg, Site: subcutaneous, Number of injections: 1  Injection Site(s): Left arm  Post Care: Patient declined observation  Discharge: Condition: Good, Destination: Home . AVS Provided  Performed by:  Wyvonne Lenz, RN

## 2024-01-23 ENCOUNTER — Other Ambulatory Visit: Payer: Self-pay

## 2024-01-26 DIAGNOSIS — R001 Bradycardia, unspecified: Secondary | ICD-10-CM | POA: Insufficient documentation

## 2024-01-26 NOTE — Progress Notes (Unsigned)
 Cardiology Office Note:   Date:  01/28/2024  ID:  Adrienne [name], Adrienne [name] [birth year], MRN [id number] PCP: [name], MD  [facility] Providers Cardiologist:  Lynwood Schilling, MD {  History of Present Illness:   Adrienne [name] is a 78 y.o. female who presents for follow up of CHF.  She had an EF of 44% in 2010.   EF was 55% in 2015.  Since I last saw her she has done well.  She did have some leg pain and I sent her and she was found to have ABIs of 0.76 on the right 10.73 on the left.  She did see Dr. Court who recommended no further cardiovascular testing but just continued risk management.   Echo in March 2024 demonstrated an EF of 45 - 50%.  After this echocardiogram she was started on spironolactone.  She has had lower blood pressures and has not tolerated med titration.  I did send her for an MRI in the past.  There was no infiltrative disease.  The EF was 49%.  She had some mild mitral valve prolapse and mitral regurgitation.  She has had some bradycardia without any significant symptomatic bradycardia arrhythmias.  Since I last saw her she has been diagnosed with pancreatic cancer.  She has been treated with FOLFIRINOX.  She had stable disease on staging in May 2025.  She is having gemcitabine  and Abraxane  and is going to be considered for surgery.  She actually says that from a cardiac standpoint she has been doing relatively well.  She denies any palpitations.  She has not been having any chest discomfort, neck or arm discomfort.  She is not been having any presyncope or syncope.  She has not been having any weight gain or edema.  ROS: As stated in the HPI and negative for all other systems.  Studies Reviewed:    EKG:     Possible ectopic atrial rhythm versus abnormal lead placement, right axis deviation, right bundle branch block, first-degree AV block, right bundle branch block is new compared to previous as it is axis change.  January 18, 2024    Risk  Assessment/Calculations:              Physical Exam:   VS:  BP 138/80   Pulse 60   Ht 5' 3.5 (1.613 m)   Wt 145 lb (65.8 kg)   BMI 25.28 kg/m    Wt Readings from Last 3 Encounters:  01/28/24 145 lb (65.8 kg)  01/22/24 144 lb 9.6 oz (65.6 kg)  01/20/24 148 lb 4 oz (67.2 kg)     GEN: Well nourished, well developed in no acute distress NECK: No JVD; No carotid bruits CARDIAC: RRR, no murmurs, rubs, gallops RESPIRATORY:  Clear to auscultation without rales, wheezing or rhonchi  ABDOMEN: Soft, non-tender, non-distended EXTREMITIES:  No edema; No deformity   ASSESSMENT AND PLAN:   History of Cardiomyopathy : The patient has a history of cardiomyopathy with no infiltrative disease on MRI.  EF was 49%.  She has not tolerated med titration in the past.  Etiology is not clear.  Is not thought to be ischemic.  He had a negative ischemia workup back in 2010 when she had her diagnosis of reduced ejection fraction.  She has had no new symptoms.  No change in therapy.  Mild Mitral Valve Prolapse   she has had mild MR most recent echo and I will follow this clinically.  Bradycardia : She is worn  a monitor before and does have bradycardia but no symptomatic bradycardia arrhythmias.  She has a new right bundle branch block and will need to follow-up closely for symptoms.  Currently she is symptom-free.    HTN : Her blood pressure is well-controlled.  No change in therapy.   HLD : She has been intolerant to statins.  She has had coronary calcium  in the past.  I would eventually like to consider treating her with PCSK9 but given her current situation I think we can hold off and follow this in the future.    Pancreatic cancer: She is getting therapy as above.  She is not had any of the symptoms of myocardial ischemia or vasospasm that can happen.  The QTc was not prolonged.  She does have bradycardia arrhythmia but no symptoms.  I will follow-up with an echocardiogram but I would like to wait till  closer to the time of surgery.  I will schedule her for 4-month follow-up or she will let me know prior to surgery at which point I would repeat the echocardiogram.   Follow up with me in 6 months or sooner based on the timing of the surgery.  Signed, Lynwood Schilling, MD

## 2024-01-28 ENCOUNTER — Encounter: Payer: Self-pay | Admitting: Cardiology

## 2024-01-28 ENCOUNTER — Ambulatory Visit (INDEPENDENT_AMBULATORY_CARE_PROVIDER_SITE_OTHER): Admitting: Cardiology

## 2024-01-28 VITALS — BP 138/80 | HR 60 | Ht 63.5 in | Wt 145.0 lb

## 2024-01-28 DIAGNOSIS — I420 Dilated cardiomyopathy: Secondary | ICD-10-CM

## 2024-01-28 DIAGNOSIS — I10 Essential (primary) hypertension: Secondary | ICD-10-CM | POA: Diagnosis not present

## 2024-01-28 DIAGNOSIS — I341 Nonrheumatic mitral (valve) prolapse: Secondary | ICD-10-CM

## 2024-01-28 DIAGNOSIS — E785 Hyperlipidemia, unspecified: Secondary | ICD-10-CM | POA: Diagnosis not present

## 2024-01-28 DIAGNOSIS — R001 Bradycardia, unspecified: Secondary | ICD-10-CM | POA: Diagnosis not present

## 2024-01-28 NOTE — Patient Instructions (Addendum)

## 2024-02-02 NOTE — Assessment & Plan Note (Signed)
 cT2N0M0, stage IB -Patient presented with obstructive jaundice, CT and MRI showed a 2.5 cm mass in the head of the pancreas, with pancreatic duct and biliary duct dilatation.  ERCP was attempted twice but failed due to deformed duodenum -She underwent ERCP and stent placement, EUS with fine-needle biopsy of the pancreatic mass by Dr. Wilhelmenia on July 08, 2023.  Biopsy confirmed adenocarcinoma. -Due to the tumor invasion to portal vein and SMV, I recommended neoadjuvant chemotherapy FOLFIRINOX, she started on 08/05/2023 -restaging CT 11/05/2023 showed stable disease  - Patient was seen by pancreatobiliary surgeon Dr. Dasie, who also consulted her colleagues at [facility], and they recommend a total of 6 months neoadjuvant chemotherapy.  Will change her chemo to second line gemcitabine  and Abraxane  for additional 3 months. She started on 12/02/2023

## 2024-02-03 ENCOUNTER — Inpatient Hospital Stay: Admitting: Hematology

## 2024-02-03 ENCOUNTER — Inpatient Hospital Stay

## 2024-02-03 ENCOUNTER — Inpatient Hospital Stay: Attending: Nurse Practitioner

## 2024-02-03 VITALS — BP 154/72 | HR 75 | Temp 97.3°F | Resp 18 | Ht 63.5 in | Wt 149.7 lb

## 2024-02-03 DIAGNOSIS — D6481 Anemia due to antineoplastic chemotherapy: Secondary | ICD-10-CM | POA: Insufficient documentation

## 2024-02-03 DIAGNOSIS — Z79631 Long term (current) use of antimetabolite agent: Secondary | ICD-10-CM | POA: Insufficient documentation

## 2024-02-03 DIAGNOSIS — Z5111 Encounter for antineoplastic chemotherapy: Secondary | ICD-10-CM | POA: Diagnosis not present

## 2024-02-03 DIAGNOSIS — K8689 Other specified diseases of pancreas: Secondary | ICD-10-CM

## 2024-02-03 DIAGNOSIS — C259 Malignant neoplasm of pancreas, unspecified: Secondary | ICD-10-CM | POA: Diagnosis not present

## 2024-02-03 DIAGNOSIS — D0511 Intraductal carcinoma in situ of right breast: Secondary | ICD-10-CM

## 2024-02-03 DIAGNOSIS — T451X5A Adverse effect of antineoplastic and immunosuppressive drugs, initial encounter: Secondary | ICD-10-CM

## 2024-02-03 DIAGNOSIS — C250 Malignant neoplasm of head of pancreas: Secondary | ICD-10-CM

## 2024-02-03 DIAGNOSIS — Z95828 Presence of other vascular implants and grafts: Secondary | ICD-10-CM

## 2024-02-03 LAB — CBC WITH DIFFERENTIAL (CANCER CENTER ONLY)
Abs Immature Granulocytes: 0.02 K/uL (ref 0.00–0.07)
Basophils Absolute: 0 K/uL (ref 0.0–0.1)
Basophils Relative: 1 %
Eosinophils Absolute: 0.2 K/uL (ref 0.0–0.5)
Eosinophils Relative: 2 %
HCT: 25.1 % — ABNORMAL LOW (ref 36.0–46.0)
Hemoglobin: 8.3 g/dL — ABNORMAL LOW (ref 12.0–15.0)
Immature Granulocytes: 0 %
Lymphocytes Relative: 23 %
Lymphs Abs: 2 K/uL (ref 0.7–4.0)
MCH: 32 pg (ref 26.0–34.0)
MCHC: 33.1 g/dL (ref 30.0–36.0)
MCV: 96.9 fL (ref 80.0–100.0)
Monocytes Absolute: 1.2 K/uL — ABNORMAL HIGH (ref 0.1–1.0)
Monocytes Relative: 15 %
Neutro Abs: 5.1 K/uL (ref 1.7–7.7)
Neutrophils Relative %: 59 %
Platelet Count: 264 K/uL (ref 150–400)
RBC: 2.59 MIL/uL — ABNORMAL LOW (ref 3.87–5.11)
RDW: 18.5 % — ABNORMAL HIGH (ref 11.5–15.5)
WBC Count: 8.5 K/uL (ref 4.0–10.5)
nRBC: 0 % (ref 0.0–0.2)

## 2024-02-03 LAB — CMP (CANCER CENTER ONLY)
ALT: 12 U/L (ref 0–44)
AST: 23 U/L (ref 15–41)
Albumin: 3.7 g/dL (ref 3.5–5.0)
Alkaline Phosphatase: 102 U/L (ref 38–126)
Anion gap: 5 (ref 5–15)
BUN: 11 mg/dL (ref 8–23)
CO2: 27 mmol/L (ref 22–32)
Calcium: 8.5 mg/dL — ABNORMAL LOW (ref 8.9–10.3)
Chloride: 108 mmol/L (ref 98–111)
Creatinine: 0.88 mg/dL (ref 0.44–1.00)
GFR, Estimated: >60 mL/min (ref >60)
Glucose, Bld: 83 mg/dL (ref 70–99)
Potassium: 3.6 mmol/L (ref 3.5–5.1)
Sodium: 140 mmol/L (ref 135–145)
Total Bilirubin: 0.4 mg/dL (ref 0.0–1.2)
Total Protein: 7 g/dL (ref 6.5–8.1)

## 2024-02-03 LAB — SAMPLE TO BLOOD BANK

## 2024-02-03 MED ORDER — SODIUM CHLORIDE 0.9 % IV SOLN
1000.0000 mg/m2 | Freq: Once | INTRAVENOUS | Status: AC
  Administered 2024-02-03 (×2): 1786 mg via INTRAVENOUS
  Filled 2024-02-03: qty 46.97

## 2024-02-03 MED ORDER — SODIUM CHLORIDE 0.9 % IV SOLN: INTRAVENOUS | Status: DC

## 2024-02-03 MED ORDER — PROCHLORPERAZINE MALEATE 10 MG PO TABS
10.0000 mg | ORAL_TABLET | Freq: Once | ORAL | Status: AC
  Administered 2024-02-03 (×2): 10 mg via ORAL
  Filled 2024-02-03: qty 1

## 2024-02-03 MED ORDER — SODIUM CHLORIDE 0.9% FLUSH
10.0000 mL | Freq: Once | INTRAVENOUS | Status: AC
Start: 2024-02-03 — End: 2024-02-03
  Administered 2024-02-03 (×2): 10 mL

## 2024-02-03 NOTE — Patient Instructions (Signed)

## 2024-02-03 NOTE — Progress Notes (Signed)
 [facility]   [phone] [phone]   Clinic Follow up Note   Patient Care Team: [name], MD as PCP - General (Internal Medicine) [name], MD as PCP - Cardiology (Cardiology) [name], RN as Oncology Nurse Navigator Glean, Stephane BROCKS, RN (Inactive) as Oncology Nurse Navigator [name], MD as Consulting Physician (General Surgery) [name], MD as Consulting Physician (Hematology) [name], MD as Attending Physician (Radiation Oncology) Nicholaus Sherlean CROME, [facility] (Inactive) as Pharmacist (Pharmacist)  Date of Service:  02/03/2024  CHIEF COMPLAINT: f/u of pancreatic cancer  CURRENT THERAPY:  Gemcitabine  and Abraxane   Oncology History   Pancreatic adenocarcinoma (HCC) cT2N0M0, stage IB -Patient presented with obstructive jaundice, CT and MRI showed a 2.5 cm mass in the head of the pancreas, with pancreatic duct and biliary duct dilatation.  ERCP was attempted twice but failed due to deformed duodenum -She underwent ERCP and stent placement, EUS with fine-needle biopsy of the pancreatic mass by Dr. Wilhelmenia on July 08, 2023.  Biopsy confirmed adenocarcinoma. -Due to the tumor invasion to portal vein and SMV, I recommended neoadjuvant chemotherapy FOLFIRINOX, she started on 08/05/2023 -restaging CT 11/05/2023 showed stable disease  - Patient was seen by pancreatobiliary surgeon Dr. Dasie, who also consulted her colleagues at [facility], and they recommend a total of 6 months neoadjuvant chemotherapy.  Will change her chemo to second line gemcitabine  and Abraxane  for additional 3 months. She started on 12/02/2023  Assessment & Plan Pancreatic cancer Pancreatic cancer with ongoing chemotherapy treatment. Severe neuropathy attributed to Abraxane  necessitates discontinuation. Potential surgical intervention at [facility] for curative intent discussed. Surgery is the only curative option but poses significant risks, especially  considering age. Radiation therapy discussed as an alternative if surgery is not pursued or feasible, with potential side effects including nausea and diarrhea. - Discontinue Abraxane  due to neuropathy. - Continue gemcitabine  chemotherapy. - Schedule CT scan on February 18, 2024. - Discuss surgical options with pancreatic surgeon at [facility] after CT scan. - Consider radiation therapy if surgery is not pursued or feasible. - Monitor tumor markers and clinical status post-radiation if chosen.  Chemotherapy-induced peripheral neuropathy Severe chemotherapy-induced peripheral neuropathy, rated 10/10 in severity, affecting both hands and feet. Symptoms include tingling, difficulty with hand function, and balance issues. Neuropathy attributed to Abraxane , necessitating discontinuation of the drug. - Discontinue Abraxane  due to severe neuropathy. - Advise on fall prevention strategies, including wearing supportive shoes and being cautious on stairs.  Plan - Lab reviewed, adequate for treatment.  Due to significant neuropathy, will stop Abraxane , and proceed with gemcitabine  alone - She will return next week for chemo gemcitabine  - She is scheduled for restaging CT on August 27, plan to see her after CT scan to determine next step -We reviewed surgery, will reach out to Dr. Dasie and possible referral to [facility] after next CT    SUMMARY OF ONCOLOGIC HISTORY: Oncology History  Ductal carcinoma in situ (DCIS) of right breast  01/28/2019 Mammogram   Diagnostic Mammogram 01/28/19  IMPRESSION: Right breast retroareolar 7 mm grouped pleomorphic calcifications are suspicious.   02/02/2019 Initial Biopsy   Diagnosis 02/02/19 Breast, right, needle core biopsy, outer retroareolar - DUCTAL CARCINOMA IN SITU WITH CALCIFICATIONS. Microscopic Comment The ductal carcinoma in situ is intermediate grade. Estrogen and progesterone receptors will be performed.  Results: IMMUNOHISTOCHEMICAL AND MORPHOMETRIC ANALYSIS  PERFORMED MANUALLY Estrogen Receptor: 90%, POSITIVE, STRONG STAINING INTENSITY Progesterone Receptor: 20%, POSITIVE, STRONG STAINING INTENSITY    02/02/2019 Cancer Staging  Staging form: Breast, AJCC 8th Edition - Clinical stage from 02/02/2019: Stage 0 (cTis (DCIS), cN0, cM0, G2, ER+, PR+, HER2: Not Assessed) - Signed by [name], MD on 02/10/2019   02/04/2019 Initial Diagnosis   Ductal carcinoma in situ (DCIS) of right breast   03/18/2019 Surgery   RIGHT BREAST LUMPECTOMY WITH RADIOACTIVE SEED LOCALIZATION by Dr. Curvin 03/18/19   03/18/2019 Pathology Results   FINAL MICROSCOPIC DIAGNOSIS: 03/18/19 A. BREAST, RIGHT, LUMPECTOMY:  - Ductal carcinoma in situ with calcifications and necrosis, 1.4 cm.  - Ductal carcinoma in situ focally 0.1 cm from medial and anterior  lumpectomy margins.  - Ductal carcinoma in situ involves the final anterior margin (See part  D).  - Ductal carcinoma in situ focally 0.3 cm from superior lumpectomy  margin.  - Biopsy site and biopsy clip.   B. BREAST, RIGHT LATERAL MARGIN, EXCISION:  - Benign breast tissue.  - No ductal carcinoma in situ.  - Final lateral margin greater than 1 cm.   C. BREAST, RIGHT MEDIAL MARGIN, EXCISION:  - Ductal carcinoma in situ, 0.3 cm.  - Ductal carcinoma in situ with focally 0.3 cm from final medial margin.   D. BREAST, RIGHT ANTERIOR MARGIN, EXCISION:  - Ductal carcinoma in situ, 1.5 cm.  - Ductal carcinoma in situ focally involves final anterior margin.   E. BREAST, RIGHT DEEP MARGIN, EXCISION:  - Fibrocystic changes.  - No ductal carcinoma in situ.  - Final posterior margin greater than 0.7 cm.    04/08/2019 Surgery   RE-EXCISION OF RIGHT BREAST ANTERIOR MARGINS by Dr Curvin 04/08/19   04/08/2019 Pathology Results    DIAGNOSIS: 04/08/19  A. BREAST, RIGHT, ANTERIOR SUPERIOR, EXCISION:  - Intermediate grade ductal carcinoma in situ with necrosis.  - In situ carcinoma is <1 mm (not on ink) from the new anterior  superior  margin, multifocally.  - Intraductal papilloma.  - Fibrocystic change.   B. BREAST, RIGHT, ANTERIOR INFERIOR, EXCISION:  - Benign breast tissue with resection changes.   C. BREAST, RIGHT, MEDIAL, EXCISION:  - Benign breast tissue with resection changes.  - Fibroadenomatoid and fibrocystic changes.   05/06/2019 Mammogram   Right Mammogram 05/06/19 IMPRESSION: Suspicious finding with 2 groups of microcalcifications in the medial right breast. The anterior group measures 2 x 2 x 2 mm. The more posterior group measures 4 x 3 x 4 mm.   05/12/2019 Pathology Results   Diagnosis 05/12/19 1. Breast, right, needle core biopsy, medial - DUCTAL CARCINOMA IN SITU WITH CALCIFICATIONS. - SEE MICROSCOPIC DESCRIPTION. 2. Breast, right, needle core biopsy, medial - DUCTAL CARCINOMA IN SITU WITH CALCIFICATIONS. - SEE MICROSCOPIC DESCRIPTION. Results: IMMUNOHISTOCHEMICAL AND MORPHOMETRIC ANALYSIS PERFORMED MANUALLY Estrogen Receptor: 100%, POSITIVE, STRONG STAINING INTENSITY Progesterone Receptor: 0%, NEGATIVE COMMENT: The negative hormone receptor study(ies) in this case has an internal positive control. Results: IMMUNOHISTOCHEMICAL AND MORPHOMETRIC ANALYSIS PERFORMED MANUALLY Estrogen Receptor: 100%, POSITIVE, STRONG STAINING INTENSITY Progesterone Receptor: 0%, NEGATIVE COMMENT: The negative hormone receptor study(ies) in this case has an internal positive control.   07/26/2019 Surgery   RIGHT MASTECTOMY WITH SENTINEL NODE BIOPSY by Dr. Curvin 07/26/19   07/26/2019 Pathology Results   FINAL MICROSCOPIC DIAGNOSIS: 07/26/19  A. LYMPH NODE, RIGHT #1, SENTINEL,  BIOPSY:  -  No carcinoma identified in one lymph node (0/1)   B. BREAST, RIGHT, MASTECTOMY:  -  Ductal carcinoma in situ, intermediate grade, 0.6 cm  -  Margins uninvolved by carcinoma (greater than 1 cm; posterior margin)  -  Previous biopsy site  changes (x2)  -  See oncology table below   07/26/2019 Cancer Staging   Staging  form: Breast, AJCC 8th Edition - Pathologic stage from 07/26/2019: Stage 0 (pTis (DCIS), pN0, cM0, G2, ER+, PR+, HER2: Not Assessed) - Signed by [name], MD on 08/10/2019   08/15/2023 Genetic Testing   Negative [facility] CancerNext-Expanded +RNAinsight Panel.  VUS in ATM at c.4910-891G>A and SDHA at c.-1C>G.  Report date is 08/15/2023.   The CancerNext-Expanded gene panel offered by [facility] and includes sequencing, rearrangement, and RNA analysis for the following 76 genes: AIP, ALK, APC, ATM, AXIN2, BAP1, BARD1, BMPR1A, BRCA1, BRCA2, BRIP1, CDC73, CDH1, CDK4, CDKN1B, CDKN2A, CEBPA, CHEK2, CTNNA1, DDX41, DICER1, ETV6, FH, FLCN, GATA2, LZTR1, MAX, MBD4, MEN1, MET, MLH1, MSH2, MSH3, MSH6, MUTYH, NF1, NF2, NTHL1, PALB2, PHOX2B, PMS2, POT1, PRKAR1A, PTCH1, PTEN, RAD51C, RAD51D, RB1, RET, RUNX1, SDHA, SDHAF2, SDHB, SDHC, SDHD, SMAD4, SMARCA4, SMARCB1, SMARCE1, STK11, SUFU, TMEM127, TP53, TSC1, TSC2, VHL, and WT1 (sequencing and deletion/duplication); EGFR, HOXB13, KIT, MITF, PDGFRA, POLD1, and POLE (sequencing only); EPCAM and GREM1 (deletion/duplication only).    Pancreatic adenocarcinoma (HCC)  07/07/2022 Cancer Staging   Staging form: Exocrine Pancreas, AJCC 8th Edition - Clinical stage from 07/07/2022: Stage IB (cT2, cN0, cM0) - Signed by [name], MD on 07/22/2023 Stage prefix: Initial diagnosis Total positive nodes: 0   06/13/2023 Initial Diagnosis   Pancreatic adenocarcinoma (HCC)   08/06/2023 - 11/13/2023 Chemotherapy   Patient is on Treatment Plan : PANCREAS Modified FOLFIRINOX q14d x 4 cycles     08/15/2023 Genetic Testing   Negative [facility] CancerNext-Expanded +RNAinsight Panel.  VUS in ATM at c.4910-891G>A and SDHA at c.-1C>G.  Report date is 08/15/2023.   The CancerNext-Expanded gene panel offered by [facility] and includes sequencing, rearrangement, and RNA analysis for the following 76 genes: AIP, ALK, APC, ATM, AXIN2, BAP1, BARD1, BMPR1A, BRCA1, BRCA2, BRIP1, CDC73, CDH1, CDK4, CDKN1B,  CDKN2A, CEBPA, CHEK2, CTNNA1, DDX41, DICER1, ETV6, FH, FLCN, GATA2, LZTR1, MAX, MBD4, MEN1, MET, MLH1, MSH2, MSH3, MSH6, MUTYH, NF1, NF2, NTHL1, PALB2, PHOX2B, PMS2, POT1, PRKAR1A, PTCH1, PTEN, RAD51C, RAD51D, RB1, RET, RUNX1, SDHA, SDHAF2, SDHB, SDHC, SDHD, SMAD4, SMARCA4, SMARCB1, SMARCE1, STK11, SUFU, TMEM127, TP53, TSC1, TSC2, VHL, and WT1 (sequencing and deletion/duplication); EGFR, HOXB13, KIT, MITF, PDGFRA, POLD1, and POLE (sequencing only); EPCAM and GREM1 (deletion/duplication only).    12/02/2023 -  Chemotherapy   Patient is on Treatment Plan : PANCREATIC Abraxane  D1,8 + Gemcitabine  D1,8 q21d        Discussed the use of AI scribe software for clinical note transcription with the patient, who gave verbal consent to proceed.  History of Present Illness Adrienne [name] is a 78 year old female with pancreatic cancer who presents for follow-up.  She experiences significant neuropathy with a tingling sensation rated at 10 out of 10 in both hands and feet, causing difficulty with hand functions and balance while walking. She has not experienced any falls. There is no numbness.  She is currently undergoing chemotherapy and has not experienced significant fatigue following her last cycle. She is eating well and has a scan scheduled for February 18, 2024.     All other systems were reviewed with the patient and are negative.  MEDICAL HISTORY:  Past Medical History:  Diagnosis Date   Allergy    Ambulates with cane    straight cane   Arthritis    knee, back   CHF (congestive heart failure) (HCC)    Ductal carcinoma in situ of breast 12/2018  R Breast-mastectomy only   Gout    History of blood transfusion 1986   w/ Hysterectomy surgery   Hyperlipidemia    diet controlled - no meds   Hypertension    Hypothyroidism    Pelvic kidney    lower right pelvic kidney    SBO (small bowel obstruction) (HCC) 10/2016   surgery    Sleep apnea    Mild - no mask needed per sleep study    Smoker    quit smoking 2018   Type 2 diabetes mellitus (HCC)     SURGICAL HISTORY: Past Surgical History:  Procedure Laterality Date   ABDOMINAL HYSTERECTOMY  1986   COMPLETE-precancerous   APPENDECTOMY     pt states it was removed when gallbladder was removed.   AUGMENTATION MAMMAPLASTY     BACK SURGERY     lower back   BILIARY BRUSHING  07/08/2023   Procedure: BILIARY BRUSHING;  Surgeon: Wilhelmenia Aloha Raddle., MD;  Location: [facility];  Service: Gastroenterology;;   BILIARY STENT PLACEMENT N/A 07/08/2023   Procedure: BILIARY STENT PLACEMENT;  Surgeon: Wilhelmenia Aloha Raddle., MD;  Location: [facility];  Service: Gastroenterology;  Laterality: N/A;   BIOPSY  06/14/2023   Procedure: BIOPSY;  Surgeon: Avram Lupita BRAVO, MD;  Location: [facility];  Service: Gastroenterology;;   BIOPSY  07/08/2023   Procedure: BIOPSY;  Surgeon: Wilhelmenia Aloha Raddle., MD;  Location: [facility];  Service: Gastroenterology;;   BREAST BIOPSY Right 05/12/2019   times 2   BREAST LUMPECTOMY WITH RADIOACTIVE SEED LOCALIZATION Right 03/18/2019   Procedure: RIGHT BREAST LUMPECTOMY WITH RADIOACTIVE SEED LOCALIZATION;  Surgeon: [name], MD;  Location: [facility];  Service: General;  Laterality: Right;   CHOLECYSTECTOMY     COLONOSCOPY  02/15/2008   Kaplan    COLONOSCOPY WITH PROPOFOL   02/27/2018   Dr.Nandigam   ECTOPIC PREGNANCY SURGERY     ERCP N/A 06/14/2023   Procedure: ENDOSCOPIC RETROGRADE CHOLANGIOPANCREATOGRAPHY (ERCP);  Surgeon: Avram Lupita BRAVO, MD;  Location: [facility];  Service: Gastroenterology;  Laterality: N/A;   ERCP N/A 06/27/2023   Procedure: ENDOSCOPIC RETROGRADE CHOLANGIOPANCREATOGRAPHY (ERCP);  Surgeon: Charlanne Groom, MD;  Location: [facility];  Service: Gastroenterology;  Laterality: N/A;   ERCP N/A 07/08/2023   Procedure: ENDOSCOPIC RETROGRADE CHOLANGIOPANCREATOGRAPHY (ERCP);  Surgeon: Wilhelmenia Aloha Raddle., MD;  Location: [facility];  Service: Gastroenterology;   Laterality: N/A;   ESOPHAGOGASTRODUODENOSCOPY N/A 07/08/2023   Procedure: ESOPHAGOGASTRODUODENOSCOPY (EGD);  Surgeon: Wilhelmenia Aloha Raddle., MD;  Location: [facility];  Service: Gastroenterology;  Laterality: N/A;   ESOPHAGOGASTRODUODENOSCOPY (EGD) WITH PROPOFOL  N/A 06/14/2023   Procedure: ESOPHAGOGASTRODUODENOSCOPY (EGD) WITH PROPOFOL ;  Surgeon: Avram Lupita BRAVO, MD;  Location: [facility];  Service: Gastroenterology;  Laterality: N/A;   EUS N/A 07/08/2023   Procedure: UPPER ENDOSCOPIC ULTRASOUND (EUS) LINEAR;  Surgeon: Wilhelmenia Aloha Raddle., MD;  Location: [facility];  Service: Gastroenterology;  Laterality: N/A;   FINE NEEDLE ASPIRATION N/A 07/08/2023   Procedure: FINE NEEDLE ASPIRATION (FNA) LINEAR;  Surgeon: Wilhelmenia Aloha Raddle., MD;  Location: [facility];  Service: Gastroenterology;  Laterality: N/A;   IR IMAGING GUIDED PORT INSERTION  07/29/2023   JOINT REPLACEMENT     Left hip total Dr. Melodi 10-01-17   MALONEY DILATION  06/14/2023   Procedure: AGAPITO DILATION;  Surgeon: Avram Lupita BRAVO, MD;  Location: [facility];  Service: Gastroenterology;;   MASTECTOMY Right 07/26/2019   PANCREATIC STENT PLACEMENT  06/14/2023   Procedure: PANCREATIC STENT PLACEMENT;  Surgeon: Avram Lupita BRAVO, MD;  Location: [facility];  Service:  Gastroenterology;;   PANCREATIC STENT PLACEMENT  06/27/2023   Procedure: PANCREATIC STENT PLACEMENT;  Surgeon: Charlanne Groom, MD;  Location: [facility];  Service: Gastroenterology;;   POLYPECTOMY     polypectomy-oropharynx     RE-EXCISION OF BREAST CANCER,SUPERIOR MARGINS Right 04/08/2019   Procedure: RE-EXCISION OF RIGHT BREAST ANTERIOR MARGINS;  Surgeon: Curvin Mt III, MD;  Location: [facility];  Service: General;  Laterality: Right;   right knee meniscus     SBO  10/2016   small bowel obstruction   SIMPLE MASTECTOMY WITH AXILLARY SENTINEL NODE BIOPSY Right 07/26/2019   Procedure: RIGHT MASTECTOMY WITH SENTINEL NODE BIOPSY;  Surgeon: Curvin Mt MOULD, MD;   Location: [facility];  Service: General;  Laterality: Right;   SPHINCTEROTOMY  06/27/2023   Procedure: SPHINCTEROTOMY;  Surgeon: Charlanne Groom, MD;  Location: [facility];  Service: Gastroenterology;;   ANNETT  07/08/2023   Procedure: ANNETT;  Surgeon: Wilhelmenia Aloha Raddle., MD;  Location: [facility];  Service: Gastroenterology;;   TOTAL HIP ARTHROPLASTY Left 10/01/2017   Procedure: LEFT  TOTAL HIP ARTHROPLASTY ANTERIOR APPROACH;  Surgeon: Melodi Lerner, MD;  Location: [facility];  Service: Orthopedics;  Laterality: Left;   TOTAL HIP ARTHROPLASTY Right 03/11/2018   Procedure: RIGHT TOTAL HIP ARTHROPLASTY ANTERIOR APPROACH;  Surgeon: Melodi Lerner, MD;  Location: [facility];  Service: Orthopedics;  Laterality: Right;    UPPER GASTROINTESTINAL ENDOSCOPY      I have reviewed the social history and family history with the patient and they are unchanged from previous note.  ALLERGIES:  is allergic to lisinopril , oxaliplatin , statins, and atorvastatin .  MEDICATIONS:  Current Outpatient Medications  Medication Sig Dispense Refill   Cholecalciferol (VITAMIN D -3) 125 MCG (5000 UT) TABS Take 1 tablet by mouth daily.     cyanocobalamin (VITAMIN B12) 1000 MCG tablet Take 1,000 mcg by mouth daily.     famotidine  (PEPCID ) 20 MG tablet Take 1 tablet (20 mg total) by mouth 2 (two) times daily. (Patient not taking: Reported on 01/28/2024) 30 tablet 1   fluticasone  (FLONASE ) 50 MCG/ACT nasal spray Place 2 sprays into both nostrils daily. (Patient not taking: Reported on 01/28/2024) 16 g 6   Glucose Blood (BLOOD GLUCOSE TEST STRIPS) STRP 1 each by In Vitro route in the morning, at noon, and at bedtime. May substitute to any manufacturer covered by patient's insurance. 100 strip 5   insulin  aspart (NOVOLOG ) 100 UNIT/ML FlexPen Inject 0-6 Units into the skin 3 (three) times daily with meals. Check Blood Glucose (BG) and inject per scale: BG <150= 0 unit; BG 150-200= 1 unit; BG  201-250= 2 unit; BG 251-300= 3 unit; BG 301-350= 4 unit; BG 351-400= 5 unit; BG >400= 6 unit and Call Primary Care. 15 mL 0   Insulin  Glargine (BASAGLAR  KWIKPEN) 100 UNIT/ML Inject 12 Units into the skin daily. May substitute as needed per insurance. (Patient taking differently: Inject 12 Units into the skin at bedtime.) 15 mL 2   Lancets (ONETOUCH DELICA PLUS LANCET33G) MISC 1 each by Does not apply route 3 (three) times daily. 100 each 0   lidocaine -prilocaine  (EMLA ) cream Apply 1 Application topically as needed. 30 g 2   losartan  (COZAAR ) 100 MG tablet Take 1 tablet (100 mg total) by mouth daily. 90 tablet 1   NIFEdipine  (ADALAT  CC) 60 MG 24 hr tablet Take 1 tablet (60 mg total) by mouth daily. 90 tablet 1   ondansetron  (ZOFRAN ) 8 MG tablet Take 1 tablet (8 mg total) by mouth every 8 (eight)  hours as needed for nausea or vomiting. (Patient not taking: Reported on 01/28/2024) 20 tablet 1   potassium chloride  (KLOR-CON ) 10 MEQ tablet Take 1 tablet (10 mEq total) by mouth 3 (three) times daily for 7 days, THEN 1 tablet (10 mEq total) daily for 23 days. (Patient not taking: No sig reported) 44 tablet 0   predniSONE  (STERAPRED UNI-PAK 21 TAB) 10 MG (21) TBPK tablet Use as directed. (Patient not taking: Reported on 01/28/2024) 21 each 0   prochlorperazine  (COMPAZINE ) 10 MG tablet Take 1 tablet (10 mg total) by mouth every 6 (six) hours as needed for nausea or vomiting. 30 tablet 2   RELION PEN NEEDLES 31G X 6 MM MISC USE 1  THREE TIMES DAILY (Patient not taking: Reported on 01/28/2024) 100 each 0   thyroid  (NP THYROID ) 90 MG tablet Take 1 tablet (90 mg total) by mouth daily. 30 tablet 5   No current facility-administered medications for this visit.   Facility-Administered Medications Ordered in Other Visits  Medication Dose Route Frequency Provider Last Rate Last Admin   0.9 %  sodium chloride  infusion   Intravenous Continuous [name], MD   Stopped at 02/03/24 1252    PHYSICAL EXAMINATION: ECOG  PERFORMANCE STATUS: 2 - Symptomatic, <50% confined to bed  Vitals:   02/03/24 1052 02/03/24 1054  BP: (!) 150/70 (!) 154/72  Pulse: 75   Resp: 18   Temp: (!) 97.3 F (36.3 C)   SpO2: 97%    Wt Readings from Last 3 Encounters:  02/03/24 149 lb 11.2 oz (67.9 kg)  01/28/24 145 lb (65.8 kg)  01/22/24 144 lb 9.6 oz (65.6 kg)     GENERAL:alert, no distress and comfortable SKIN: skin color, texture, turgor are normal, no rashes or significant lesions EYES: normal, Conjunctiva are pink and non-injected, sclera clear NECK: supple, thyroid  normal size, non-tender, without nodularity LYMPH:  no palpable lymphadenopathy in the cervical, axillary  LUNGS: clear to auscultation and percussion with normal breathing effort HEART: regular rate & rhythm and no murmurs and no lower extremity edema ABDOMEN:abdomen soft, non-tender and normal bowel sounds Musculoskeletal:no cyanosis of digits and no clubbing    Physical Exam    LABORATORY DATA:  I have reviewed the data as listed    Latest Ref Rng & Units 02/03/2024   10:31 AM 01/20/2024   10:25 AM 01/18/2024    2:34 PM  CBC  WBC 4.0 - 10.5 K/uL 8.5  4.2  5.0   Hemoglobin 12.0 - 15.0 g/dL 8.3  8.6  8.6   Hematocrit 36.0 - 46.0 % 25.1  26.0  26.0   Platelets 150 - 400 K/uL 264  360  372         Latest Ref Rng & Units 02/03/2024   10:31 AM 01/20/2024   10:25 AM 01/18/2024    2:34 PM  CMP  Glucose 70 - 99 mg/dL 83  863  877   BUN 8 - 23 mg/dL 11  14  15    Creatinine 0.44 - 1.00 mg/dL 9.11  8.98  9.13   Sodium 135 - 145 mmol/L 140  137  137   Potassium 3.5 - 5.1 mmol/L 3.6  3.9  4.0   Chloride 98 - 111 mmol/L 108  105  106   CO2 22 - 32 mmol/L 27  26  24    Calcium  8.9 - 10.3 mg/dL 8.5  8.7  8.7   Total Protein 6.5 - 8.1 g/dL 7.0  6.8  7.0  Total Bilirubin 0.0 - 1.2 mg/dL 0.4  0.2  0.4   Alkaline Phos 38 - 126 U/L 102  97  91   AST 15 - 41 U/L 23  26  35   ALT 0 - 44 U/L 12  15  22        RADIOGRAPHIC STUDIES: I have personally  reviewed the radiological images as listed and agreed with the findings in the report. No results found.    No orders of the defined types were placed in this encounter.  All questions were answered. The patient knows to call the clinic with any problems, questions or concerns. No barriers to learning was detected. The total time spent in the appointment was 40 minutes, including review of chart and various tests results, discussions about plan of care and coordination of care plan     Adrienne Mattock, MD 02/03/2024

## 2024-02-04 ENCOUNTER — Telehealth: Payer: Self-pay | Admitting: Hematology

## 2024-02-04 NOTE — Telephone Encounter (Signed)
 Adrienne [name] has been scheduled for her follow up appointment and Adrienne [name] has been made aware of all appointment details.

## 2024-02-09 ENCOUNTER — Inpatient Hospital Stay

## 2024-02-09 ENCOUNTER — Other Ambulatory Visit: Payer: Self-pay

## 2024-02-09 VITALS — BP 154/89 | HR 59 | Temp 98.6°F | Resp 18 | Wt 143.2 lb

## 2024-02-09 DIAGNOSIS — C259 Malignant neoplasm of pancreas, unspecified: Secondary | ICD-10-CM

## 2024-02-09 DIAGNOSIS — K8689 Other specified diseases of pancreas: Secondary | ICD-10-CM

## 2024-02-09 DIAGNOSIS — D6481 Anemia due to antineoplastic chemotherapy: Secondary | ICD-10-CM

## 2024-02-09 DIAGNOSIS — D0511 Intraductal carcinoma in situ of right breast: Secondary | ICD-10-CM

## 2024-02-09 DIAGNOSIS — Z95828 Presence of other vascular implants and grafts: Secondary | ICD-10-CM

## 2024-02-09 DIAGNOSIS — Z5111 Encounter for antineoplastic chemotherapy: Secondary | ICD-10-CM | POA: Diagnosis not present

## 2024-02-09 DIAGNOSIS — D649 Anemia, unspecified: Secondary | ICD-10-CM

## 2024-02-09 DIAGNOSIS — C250 Malignant neoplasm of head of pancreas: Secondary | ICD-10-CM

## 2024-02-09 LAB — CBC WITH DIFFERENTIAL (CANCER CENTER ONLY)
Abs Immature Granulocytes: 0.02 K/uL (ref 0.00–0.07)
Basophils Absolute: 0 K/uL (ref 0.0–0.1)
Basophils Relative: 1 %
Eosinophils Absolute: 0.1 K/uL (ref 0.0–0.5)
Eosinophils Relative: 2 %
HCT: 22.9 % — ABNORMAL LOW (ref 36.0–46.0)
Hemoglobin: 7.6 g/dL — ABNORMAL LOW (ref 12.0–15.0)
Immature Granulocytes: 0 %
Lymphocytes Relative: 33 %
Lymphs Abs: 1.8 K/uL (ref 0.7–4.0)
MCH: 32.2 pg (ref 26.0–34.0)
MCHC: 33.2 g/dL (ref 30.0–36.0)
MCV: 97 fL (ref 80.0–100.0)
Monocytes Absolute: 0.4 K/uL (ref 0.1–1.0)
Monocytes Relative: 7 %
Neutro Abs: 3.1 K/uL (ref 1.7–7.7)
Neutrophils Relative %: 57 %
Platelet Count: 422 K/uL — ABNORMAL HIGH (ref 150–400)
RBC: 2.36 MIL/uL — ABNORMAL LOW (ref 3.87–5.11)
RDW: 17.3 % — ABNORMAL HIGH (ref 11.5–15.5)
WBC Count: 5.4 K/uL (ref 4.0–10.5)
nRBC: 0 % (ref 0.0–0.2)

## 2024-02-09 LAB — CMP (CANCER CENTER ONLY)
ALT: 13 U/L (ref 0–44)
AST: 23 U/L (ref 15–41)
Albumin: 3.7 g/dL (ref 3.5–5.0)
Alkaline Phosphatase: 97 U/L (ref 38–126)
Anion gap: 6 (ref 5–15)
BUN: 13 mg/dL (ref 8–23)
CO2: 28 mmol/L (ref 22–32)
Calcium: 8.9 mg/dL (ref 8.9–10.3)
Chloride: 105 mmol/L (ref 98–111)
Creatinine: 1.01 mg/dL — ABNORMAL HIGH (ref 0.44–1.00)
GFR, Estimated: 58 mL/min — ABNORMAL LOW (ref >60)
Glucose, Bld: 118 mg/dL — ABNORMAL HIGH (ref 70–99)
Potassium: 4.1 mmol/L (ref 3.5–5.1)
Sodium: 139 mmol/L (ref 135–145)
Total Bilirubin: 0.3 mg/dL (ref 0.0–1.2)
Total Protein: 7.1 g/dL (ref 6.5–8.1)

## 2024-02-09 LAB — SAMPLE TO BLOOD BANK

## 2024-02-09 LAB — PREPARE RBC (CROSSMATCH)

## 2024-02-09 MED ORDER — HEPARIN SOD (PORK) LOCK FLUSH 100 UNIT/ML IV SOLN
500.0000 [IU] | Freq: Once | INTRAVENOUS | Status: DC | PRN
Start: 2024-02-09 — End: 2024-02-09

## 2024-02-09 MED ORDER — SODIUM CHLORIDE 0.9% FLUSH: 10.0000 mL | INTRAVENOUS | Status: DC | PRN

## 2024-02-09 MED ORDER — SODIUM CHLORIDE 0.9 % IV SOLN: INTRAVENOUS | Status: DC

## 2024-02-09 MED ORDER — PROCHLORPERAZINE MALEATE 10 MG PO TABS
10.0000 mg | ORAL_TABLET | Freq: Once | ORAL | Status: AC
  Administered 2024-02-09: 10 mg via ORAL
  Filled 2024-02-09: qty 1

## 2024-02-09 MED ORDER — SODIUM CHLORIDE 0.9 % IV SOLN
1000.0000 mg/m2 | Freq: Once | INTRAVENOUS | Status: AC
  Administered 2024-02-09: 1786 mg via INTRAVENOUS
  Filled 2024-02-09: qty 46.97

## 2024-02-09 MED ORDER — SODIUM CHLORIDE 0.9% FLUSH
10.0000 mL | Freq: Once | INTRAVENOUS | Status: AC
  Administered 2024-02-09: 10 mL

## 2024-02-09 NOTE — Patient Instructions (Signed)
 [facility] - A DEPT OF [facility]  Discharge Instructions: Thank you for choosing [facility] to provide your oncology and hematology care.   If you have a lab appointment with the Cancer Center, please go directly to the Cancer Center and check in at the registration area.   Wear comfortable clothing and clothing appropriate for easy access to any Portacath or PICC line.   We strive to give you quality time with your provider. You may need to reschedule your appointment if you arrive late (15 or more minutes).  Arriving late affects you and other patients whose appointments are after yours.  Also, if you miss three or more appointments without notifying the office, you may be dismissed from the clinic at the provider's discretion.      For prescription refill requests, have your pharmacy contact our office and allow 72 hours for refills to be completed.    Today you received the following chemotherapy and/or immunotherapy agents: Gemcitabine.       To help prevent nausea and vomiting after your treatment, we encourage you to take your nausea medication as directed.  BELOW ARE SYMPTOMS THAT SHOULD BE REPORTED IMMEDIATELY: *FEVER GREATER THAN 100.4 F (38 C) OR HIGHER *CHILLS OR SWEATING *NAUSEA AND VOMITING THAT IS NOT CONTROLLED WITH YOUR NAUSEA MEDICATION *UNUSUAL SHORTNESS OF BREATH *UNUSUAL BRUISING OR BLEEDING *URINARY PROBLEMS (pain or burning when urinating, or frequent urination) *BOWEL PROBLEMS (unusual diarrhea, constipation, pain near the anus) TENDERNESS IN MOUTH AND THROAT WITH OR WITHOUT PRESENCE OF ULCERS (sore throat, sores in mouth, or a toothache) UNUSUAL RASH, SWELLING OR PAIN  UNUSUAL VAGINAL DISCHARGE OR ITCHING   Items with * indicate a potential emergency and should be followed up as soon as possible or go to the Emergency Department if any problems should occur.  Please show the CHEMOTHERAPY ALERT CARD or  IMMUNOTHERAPY ALERT CARD at check-in to the Emergency Department and triage nurse.  Should you have questions after your visit or need to cancel or reschedule your appointment, please contact [facility] - A DEPT OF [facility]  Dept: [phone]  and follow the prompts.  Office hours are 8:00 a.m. to 4:30 p.m. Monday - Friday. Please note that voicemails left after 4:00 p.m. may not be returned until the following business day.  We are closed weekends and major holidays. You have access to a nurse at all times for urgent questions. Please call the main number to the clinic Dept: [phone] and follow the prompts.   For any non-urgent questions, you may also contact your provider using MyChart. We now offer e-Visits for anyone 19 and older to request care online for non-urgent symptoms. For details visit [email or web address].   Also download the MyChart app! Go to the app store, search "MyChart", open the app, select [facility], and log in with your MyChart username and password.

## 2024-02-09 NOTE — Progress Notes (Signed)
 Per Lanny MD, ok to treat today with HGB 7.6. Pt will receive PRBC tomorrow.

## 2024-02-10 ENCOUNTER — Other Ambulatory Visit

## 2024-02-10 ENCOUNTER — Ambulatory Visit

## 2024-02-10 ENCOUNTER — Ambulatory Visit: Admitting: Hematology

## 2024-02-10 DIAGNOSIS — D6481 Anemia due to antineoplastic chemotherapy: Secondary | ICD-10-CM

## 2024-02-10 DIAGNOSIS — K8689 Other specified diseases of pancreas: Secondary | ICD-10-CM

## 2024-02-10 DIAGNOSIS — D0511 Intraductal carcinoma in situ of right breast: Secondary | ICD-10-CM

## 2024-02-10 DIAGNOSIS — Z5111 Encounter for antineoplastic chemotherapy: Secondary | ICD-10-CM | POA: Diagnosis not present

## 2024-02-10 DIAGNOSIS — C259 Malignant neoplasm of pancreas, unspecified: Secondary | ICD-10-CM

## 2024-02-10 DIAGNOSIS — D649 Anemia, unspecified: Secondary | ICD-10-CM

## 2024-02-10 MED ORDER — SODIUM CHLORIDE 0.9% IV SOLUTION
250.0000 mL | INTRAVENOUS | Status: DC
  Administered 2024-02-10: 100 mL via INTRAVENOUS

## 2024-02-10 NOTE — Patient Instructions (Signed)

## 2024-02-11 LAB — TYPE AND SCREEN
ABO/RH(D): B POS
Antibody Screen: NEGATIVE
Unit division: 0
Unit division: 0

## 2024-02-11 LAB — BPAM RBC
Blood Product Expiration Date: 202509122359
Blood Product Unit Number: 202509062359
ISSUE DATE / TIME: 202508190841
PRODUCT CODE: 202508190841
PRODUCT CODE: 202509122359
Unit Type and Rh: 7300
Unit Type and Rh: 202509062359
Unit Type and Rh: 7300
Unit Type and Rh: 7300

## 2024-02-12 ENCOUNTER — Other Ambulatory Visit: Payer: Self-pay | Admitting: Internal Medicine

## 2024-02-12 DIAGNOSIS — Z1231 Encounter for screening mammogram for malignant neoplasm of breast: Secondary | ICD-10-CM

## 2024-02-18 ENCOUNTER — Ambulatory Visit (HOSPITAL_COMMUNITY)
Admission: RE | Admit: 2024-02-18 | Discharge: 2024-02-18 | Disposition: A | Discharge status: Home / Self Care | Source: Ambulatory Visit | Attending: Hematology | Admitting: Hematology

## 2024-02-18 DIAGNOSIS — K8689 Other specified diseases of pancreas: Secondary | ICD-10-CM | POA: Diagnosis not present

## 2024-02-18 DIAGNOSIS — C259 Malignant neoplasm of pancreas, unspecified: Secondary | ICD-10-CM | POA: Diagnosis not present

## 2024-02-18 MED ORDER — IOHEXOL 300 MG/ML  SOLN
100.0000 mL | Freq: Once | INTRAMUSCULAR | Status: AC | PRN
  Administered 2024-02-18: 100 mL via INTRAVENOUS

## 2024-02-18 MED ORDER — HEPARIN SOD (PORK) LOCK FLUSH 100 UNIT/ML IV SOLN
INTRAVENOUS | Status: AC
  Filled 2024-02-18: qty 5

## 2024-02-18 MED ORDER — HEPARIN SOD (PORK) LOCK FLUSH 100 UNIT/ML IV SOLN
500.0000 [IU] | Freq: Once | INTRAVENOUS | Status: AC
  Administered 2024-02-18: 500 [IU] via INTRAVENOUS

## 2024-02-20 DIAGNOSIS — H35361 Drusen (degenerative) of macula, right eye: Secondary | ICD-10-CM | POA: Diagnosis not present

## 2024-02-20 DIAGNOSIS — H40023 Open angle with borderline findings, high risk, bilateral: Secondary | ICD-10-CM | POA: Diagnosis not present

## 2024-02-20 DIAGNOSIS — Z961 Presence of intraocular lens: Secondary | ICD-10-CM | POA: Diagnosis not present

## 2024-02-20 DIAGNOSIS — H524 Presbyopia: Secondary | ICD-10-CM | POA: Diagnosis not present

## 2024-02-20 DIAGNOSIS — E119 Type 2 diabetes mellitus without complications: Secondary | ICD-10-CM | POA: Diagnosis not present

## 2024-02-20 LAB — HM DIABETES EYE EXAM

## 2024-02-24 ENCOUNTER — Ambulatory Visit: Admitting: Internal Medicine

## 2024-02-24 ENCOUNTER — Encounter: Payer: Self-pay | Admitting: Internal Medicine

## 2024-02-24 ENCOUNTER — Encounter: Payer: Self-pay | Admitting: Hematology

## 2024-02-24 ENCOUNTER — Other Ambulatory Visit: Payer: Self-pay

## 2024-02-24 VITALS — BP 128/70 | HR 87 | Ht 63.0 in | Wt 140.4 lb

## 2024-02-24 DIAGNOSIS — I10 Essential (primary) hypertension: Secondary | ICD-10-CM | POA: Diagnosis not present

## 2024-02-24 DIAGNOSIS — C259 Malignant neoplasm of pancreas, unspecified: Secondary | ICD-10-CM

## 2024-02-24 DIAGNOSIS — I428 Other cardiomyopathies: Secondary | ICD-10-CM | POA: Diagnosis not present

## 2024-02-24 DIAGNOSIS — E1169 Type 2 diabetes mellitus with other specified complication: Secondary | ICD-10-CM

## 2024-02-24 DIAGNOSIS — D61810 Antineoplastic chemotherapy induced pancytopenia: Secondary | ICD-10-CM | POA: Diagnosis not present

## 2024-02-24 DIAGNOSIS — Z794 Long term (current) use of insulin: Secondary | ICD-10-CM

## 2024-02-24 DIAGNOSIS — E039 Hypothyroidism, unspecified: Secondary | ICD-10-CM | POA: Diagnosis not present

## 2024-02-24 DIAGNOSIS — J019 Acute sinusitis, unspecified: Secondary | ICD-10-CM

## 2024-02-24 MED ORDER — AZITHROMYCIN 250 MG PO TABS: ORAL_TABLET | ORAL | 0 refills | Status: DC

## 2024-02-24 MED ORDER — BENZONATATE 200 MG PO CAPS: 200.0000 mg | ORAL_CAPSULE | Freq: Two times a day (BID) | ORAL | 0 refills | Status: DC | PRN

## 2024-02-24 NOTE — Assessment & Plan Note (Signed)
 BP Readings from Last 1 Encounters:  02/24/24 128/70   Well-controlled with Losartan  100 mg once daily and nifedipine  60 mg once daily Counseled for compliance with the medications Advised DASH diet and moderate exercise/walking, at least 150 mins/week

## 2024-02-24 NOTE — Assessment & Plan Note (Addendum)
 On Losartan  Had to stop Coreg  due to bradycardia Appears euvolemic Followed by Cardiology

## 2024-02-24 NOTE — Patient Instructions (Signed)
 Please start taking Azithromycin  as prescribed.  Please take Tessalon  as needed for cough.  Please continue to take medications as prescribed.  Please continue to follow low carb diet and ambulate as tolerated.

## 2024-02-24 NOTE — Assessment & Plan Note (Addendum)
 POC HbA1c: 7.2 in 04/25, improved than prior Well-controlled for her age, check HbA1c On Basaglar  12 units nightly and NovoLog  ISS Associated with HTN and HLD Metformin  discontinued from recent hospitalization Avoid sulfonylureas or incretin analogs due to pancreatic mass Advised to follow diabetic diet On ARB F/u CMP and lipid panel Diabetic eye exam: Advised to follow up with Ophthalmology for diabetic eye exam

## 2024-02-24 NOTE — Assessment & Plan Note (Addendum)
 Lab Results  Component Value Date   TSH 3.783 06/27/2023   On NP thyroid  90 mg QD, had decreased dose in the past due to oversupplementation Due to recent weight loss, recheck TSH and free T4 Had discussed about switching to Levothyroxine , she would prefer to stay on NP thyroid  for now

## 2024-02-24 NOTE — Assessment & Plan Note (Signed)
 Considering persistent symptoms despite symptomatic treatment, started empiric azithromycin  Advised to use Flonase  as needed for nasal congestion/allergies Tessalon  as needed for cough

## 2024-02-24 NOTE — Progress Notes (Signed)
 Established Patient Office Visit  Subjective:  Patient ID: Adrienne [name], female    DOB: [birth year]  Age: 78 y.o. MRN: [id number]  CC:  Chief Complaint  Patient presents with   Diabetes    4 month f/u    Hypertension    4 month f/u.    Cough    Reports a cough ongoing for 1 week.     HPI Adrienne [name] is a 78 y.o. female with past medical history of HTN, type 2 DM, nonishcemic CM, hypothyroidism, OA of hip and knee, HLD, breast ca. s/p right mastectomy who presents for f/u of her chronic medical conditions.  HTN: BP is WNL today. Takes Losartan  100 mg QD and nifedipine  60 mg QD regularly. Coreg  was discontinued due to bradycardia. Patient denies headache, dizziness, chest pain, dyspnea or palpitations.  Hypothyroidism: She takes NP thyroid  90 mg daily currently.  She has history of oversupplemented thyroid  in the past, but recent TSH has been better.  Her appetite has reduced recently, likely due to chemotherapy.  She has lost about 12 lbs since the last visit.  Type 2 DM: Her HbA1c was 7.2 in 04/25, better than prior - 8.1 in 12/24.  She has been placed on Basaglar  12 units nightly and ISS.  Her blood glucose have been better controlled, around 80-140 now.  She has not needed mealtime insulin  recently.  Denies any polyuria or polydipsia currently.  Pancreatic adenocarcinoma: She has started chemotherapy.  She complains of lack of appetite, loss of taste sensation and intermittent tingling of bilateral LE.  Denies overt nausea or vomiting.  Denies chronic diarrhea.  Nonischemic CM: She has been evaluated by cardiology.  She had echocardiogram in 03/24, which showed MR with mild MVP.  HLD: She has hot intolerance to statins.  Her LDL was 145 in 04/24.  She had Pharm.D. visit for PCSK9 inhibitor therapy.  She was approved for Leqvio , which has been held currently due to ongoing chemotherapy.  She complains of nasal congestion, postnasal drip, sore throat and dry cough  for the last 1 week.  Denies any fever or chills.  Denies any recent worsening of dyspnea and wheezing.   Past Medical History:  Diagnosis Date   Allergy    Ambulates with cane    straight cane   Arthritis    knee, back   CHF (congestive heart failure) (HCC)    Ductal carcinoma in situ of breast 12/2018   R Breast-mastectomy only   Gout    History of blood transfusion 1986   w/ Hysterectomy surgery   Hyperlipidemia    diet controlled - no meds   Hypertension    Hypothyroidism    Pelvic kidney    lower right pelvic kidney    SBO (small bowel obstruction) (HCC) 10/2016   surgery    Sleep apnea    Mild - no mask needed per sleep study   Smoker    quit smoking 2018   Type 2 diabetes mellitus (HCC)     Past Surgical History:  Procedure Laterality Date   ABDOMINAL HYSTERECTOMY  1986   COMPLETE-precancerous   APPENDECTOMY     pt states it was removed when gallbladder was removed.   AUGMENTATION MAMMAPLASTY     BACK SURGERY     lower back   BILIARY BRUSHING  07/08/2023   Procedure: BILIARY BRUSHING;  Surgeon: Wilhelmenia Aloha Raddle., MD;  Location: [facility];  Service: Gastroenterology;;   BILIARY STENT PLACEMENT N/A 07/08/2023  Procedure: BILIARY STENT PLACEMENT;  Surgeon: Wilhelmenia Aloha Raddle., MD;  Location: [facility];  Service: Gastroenterology;  Laterality: N/A;   BIOPSY  06/14/2023   Procedure: BIOPSY;  Surgeon: Avram Lupita BRAVO, MD;  Location: [facility];  Service: Gastroenterology;;   BIOPSY  07/08/2023   Procedure: BIOPSY;  Surgeon: Wilhelmenia Aloha Raddle., MD;  [facility];  Service: Gastroenterology;;   BREAST BIOPSY Right 05/12/2019   times 2   BREAST LUMPECTOMY WITH RADIOACTIVE SEED LOCALIZATION Right 03/18/2019   Procedure: RIGHT BREAST LUMPECTOMY WITH RADIOACTIVE SEED LOCALIZATION;  Surgeon: [name], MD;  Location: [facility];  Service: General;  Laterality: Right;   CHOLECYSTECTOMY     COLONOSCOPY  02/15/2008   Kaplan    COLONOSCOPY  WITH PROPOFOL   02/27/2018   Dr.Nandigam   ECTOPIC PREGNANCY SURGERY     ERCP N/A 06/14/2023   Procedure: ENDOSCOPIC RETROGRADE CHOLANGIOPANCREATOGRAPHY (ERCP);  Surgeon: Avram Lupita BRAVO, MD;  Location: [facility];  Service: Gastroenterology;  Laterality: N/A;   ERCP N/A 06/27/2023   Procedure: ENDOSCOPIC RETROGRADE CHOLANGIOPANCREATOGRAPHY (ERCP);  Surgeon: Charlanne Groom, MD;  Location: [facility];  Service: Gastroenterology;  Laterality: N/A;   ERCP N/A 07/08/2023   Procedure: ENDOSCOPIC RETROGRADE CHOLANGIOPANCREATOGRAPHY (ERCP);  Surgeon: Wilhelmenia Aloha Raddle., MD;  Location: [facility];  Service: Gastroenterology;  Laterality: N/A;   ESOPHAGOGASTRODUODENOSCOPY N/A 07/08/2023   Procedure: ESOPHAGOGASTRODUODENOSCOPY (EGD);  Surgeon: Wilhelmenia Aloha Raddle., MD;  Location: [facility];  Service: Gastroenterology;  Laterality: N/A;   ESOPHAGOGASTRODUODENOSCOPY (EGD) WITH PROPOFOL  N/A 06/14/2023   Procedure: ESOPHAGOGASTRODUODENOSCOPY (EGD) WITH PROPOFOL ;  Surgeon: Avram Lupita BRAVO, MD;  [facility];  Service: Gastroenterology;  Laterality: N/A;   EUS N/A 07/08/2023   Procedure: UPPER ENDOSCOPIC ULTRASOUND (EUS) LINEAR;  Surgeon: Wilhelmenia Aloha Raddle., MD;  [facility];  Service: Gastroenterology;  Laterality: N/A;   FINE NEEDLE ASPIRATION N/A 07/08/2023   Procedure: FINE NEEDLE ASPIRATION (FNA) LINEAR;  Surgeon: Wilhelmenia Aloha Raddle., MD;  [facility];  Service: Gastroenterology;  Laterality: N/A;   IR IMAGING GUIDED PORT INSERTION  07/29/2023   JOINT REPLACEMENT     Left hip total Dr. Melodi 10-01-17   MALONEY DILATION  06/14/2023   Procedure: AGAPITO DILATION;  Surgeon: Avram Lupita BRAVO, MD;  [facility];  Service: Gastroenterology;;   MASTECTOMY Right 07/26/2019   PANCREATIC STENT PLACEMENT  06/14/2023   Procedure: PANCREATIC STENT PLACEMENT;  Surgeon: Avram Lupita BRAVO, MD;  [facility];  Service: Gastroenterology;;   PANCREATIC  STENT PLACEMENT  06/27/2023   Procedure: PANCREATIC STENT PLACEMENT;  Surgeon: Charlanne Groom, MD;  Location: [facility];  Service: Gastroenterology;;   POLYPECTOMY     polypectomy-oropharynx     RE-EXCISION OF BREAST CANCER,SUPERIOR MARGINS Right 04/08/2019   Procedure: RE-EXCISION OF RIGHT BREAST ANTERIOR MARGINS;  Surgeon: Curvin Deward III, MD;  [facility];  Service: General;  Laterality: Right;   right knee meniscus     SBO  10/2016   small bowel obstruction   SIMPLE MASTECTOMY WITH AXILLARY SENTINEL NODE BIOPSY Right 07/26/2019   Procedure: RIGHT MASTECTOMY WITH SENTINEL NODE BIOPSY;  Surgeon: [name], MD;  Location: [facility];  Service: General;  Laterality: Right;   SPHINCTEROTOMY  06/27/2023   Procedure: SPHINCTEROTOMY;  Surgeon: Charlanne Groom, MD;  Location: [facility];  Service: Gastroenterology;;   ANNETT  07/08/2023   Procedure: ANNETT;  Surgeon: Wilhelmenia Aloha Raddle., MD;  [facility];  Service: Gastroenterology;;   TOTAL HIP ARTHROPLASTY Left 10/01/2017   Procedure: LEFT  TOTAL HIP ARTHROPLASTY ANTERIOR APPROACH;  Surgeon: Melodi Lerner, MD;  Location[facility];  Service: Orthopedics;  Laterality: Left;   TOTAL HIP ARTHROPLASTY Right 03/11/2018   Procedure: RIGHT TOTAL HIP ARTHROPLASTY ANTERIOR APPROACH;  Surgeon: Melodi Lerner, MD;  [facility];  Service: Orthopedics;  Laterality: Right;    UPPER GASTROINTESTINAL ENDOSCOPY      Family History  Problem Relation Age of Onset   Cancer Sister    Diabetes Brother    Diabetes Brother    Hypertension Other    Breast cancer Sister    Colon cancer Neg Hx    Colon polyps Neg Hx    Rectal cancer Neg Hx    Stomach cancer Neg Hx     Social History   Socioeconomic History   Marital status: Widowed    Spouse name: Not on file   Number of children: 3   Years of education: 63   Highest education level: Some college, no degree  Occupational History    Occupation: retired  Tobacco Use   Smoking status: Former    Current packs/day: 0.00    Average packs/day: 1 pack/day for 32.0 years (32.0 ttl pk-yrs)    Types: E-cigarettes, Cigarettes    Start date: 11/20/1984    Quit date: 11/20/2016    Years since quitting: 7.2   Smokeless tobacco: Never   Tobacco comments:    Smoker since 1975 has quit and started back several times    As of 07/03/23: smoked 30 years, 1 PPD, quit 2018  Vaping Use   Vaping status: Former  Substance and Sexual Activity   Alcohol use: No   Drug use: No   Sexual activity: Not Currently    Birth control/protection: Surgical    Comment: Hysterectomy  Other Topics Concern   Not on file  Social History Narrative   Widow since 2006.Married previously for 25 years.Lives with grandson and 2 great grandchildren.Retired.Previously office work.   Social Drivers of Corporate investment banker Strain: Low Risk  (06/27/2021)   Overall Financial Resource Strain (CARDIA)    Difficulty of Paying Living Expenses: Not hard at all  Food Insecurity: No Food Insecurity (12/23/2023)   Hunger Vital Sign    Worried About Running Out of Food in the Last Year: Never true    Ran Out of Food in the Last Year: Never true  Transportation Needs: No Transportation Needs (12/23/2023)   PRAPARE - Administrator, Civil Service (Medical): No    Lack of Transportation (Non-Medical): No  Physical Activity: Inactive (06/27/2021)   Exercise Vital Sign    Days of Exercise per Week: 0 days    Minutes of Exercise per Session: 0 min  Stress: No Stress Concern Present (06/27/2021)   [employer] of Occupational Health - Occupational Stress Questionnaire    Feeling of Stress : Not at all  Social Connections: Moderately Integrated (07/07/2023)   Social Connection and Isolation Panel    Frequency of Communication with Friends and Family: More than three times a week    Frequency of Social Gatherings with Friends and Family: Once a week     Attends Religious Services: More than 4 times per year    Active Member of [employer] or Organizations: Yes    Attends Banker Meetings: More than 4 times per year    Marital Status: Widowed  Intimate Partner Violence: Not At Risk (12/23/2023)   Humiliation, Afraid, Rape, and Kick questionnaire    Fear of  Current or Ex-Partner: No    Emotionally Abused: No    Physically Abused: No    Sexually Abused: No    Outpatient Medications Prior to Visit  Medication Sig Dispense Refill   Cholecalciferol (VITAMIN D -3) 125 MCG (5000 UT) TABS Take 1 tablet by mouth daily.     cyanocobalamin (VITAMIN B12) 1000 MCG tablet Take 1,000 mcg by mouth daily.     Glucose Blood (BLOOD GLUCOSE TEST STRIPS) STRP 1 each by In Vitro route in the morning, at noon, and at bedtime. May substitute to any manufacturer covered by patient's insurance. 100 strip 5   insulin  aspart (NOVOLOG ) 100 UNIT/ML FlexPen Inject 0-6 Units into the skin 3 (three) times daily with meals. Check Blood Glucose (BG) and inject per scale: BG <150= 0 unit; BG 150-200= 1 unit; BG 201-250= 2 unit; BG 251-300= 3 unit; BG 301-350= 4 unit; BG 351-400= 5 unit; BG >400= 6 unit and Call Primary Care. 15 mL 0   Insulin  Glargine (BASAGLAR  KWIKPEN) 100 UNIT/ML Inject 12 Units into the skin daily. May substitute as needed per insurance. (Patient taking differently: Inject 12 Units into the skin at bedtime.) 15 mL 2   Lancets (ONETOUCH DELICA PLUS LANCET33G) MISC 1 each by Does not apply route 3 (three) times daily. 100 each 0   lidocaine -prilocaine  (EMLA ) cream Apply 1 Application topically as needed. 30 g 2   losartan  (COZAAR ) 100 MG tablet Take 1 tablet (100 mg total) by mouth daily. 90 tablet 1   NIFEdipine  (ADALAT  CC) 60 MG 24 hr tablet Take 1 tablet (60 mg total) by mouth daily. 90 tablet 1   prochlorperazine  (COMPAZINE ) 10 MG tablet Take 1 tablet (10 mg total) by mouth every 6 (six) hours as needed for nausea or vomiting. 30 tablet 2   RELION PEN  NEEDLES 31G X 6 MM MISC USE 1  THREE TIMES DAILY 100 each 0   thyroid  (NP THYROID ) 90 MG tablet Take 1 tablet (90 mg total) by mouth daily. 30 tablet 5   fluticasone  (FLONASE ) 50 MCG/ACT nasal spray Place 2 sprays into both nostrils daily. (Patient not taking: Reported on 01/28/2024) 16 g 6   ondansetron  (ZOFRAN ) 8 MG tablet Take 1 tablet (8 mg total) by mouth every 8 (eight) hours as needed for nausea or vomiting. (Patient not taking: Reported on 01/28/2024) 20 tablet 1   famotidine  (PEPCID ) 20 MG tablet Take 1 tablet (20 mg total) by mouth 2 (two) times daily. (Patient not taking: Reported on 01/28/2024) 30 tablet 1   potassium chloride  (KLOR-CON ) 10 MEQ tablet Take 1 tablet (10 mEq total) by mouth 3 (three) times daily for 7 days, THEN 1 tablet (10 mEq total) daily for 23 days. (Patient not taking: No sig reported) 44 tablet 0   predniSONE  (STERAPRED UNI-PAK 21 TAB) 10 MG (21) TBPK tablet Use as directed. (Patient not taking: Reported on 01/28/2024) 21 each 0   No facility-administered medications prior to visit.    Allergies  Allergen Reactions   Lisinopril  Swelling    Angioedema   Oxaliplatin  Shortness Of Breath and Swelling   Statins     Myalgias   Atorvastatin  Other (See Comments)    Muscle aches     ROS Review of Systems  Constitutional:  Negative for chills and fever.  HENT:  Positive for congestion, postnasal drip, sore throat and tinnitus.   Eyes:  Positive for visual disturbance. Negative for pain and discharge.  Respiratory:  Positive for cough. Negative for shortness of  breath.   Cardiovascular:  Negative for chest pain and palpitations.  Gastrointestinal:  Negative for abdominal pain, diarrhea, nausea and vomiting.  Endocrine: Negative for polydipsia and polyuria.  Genitourinary:  Negative for dysuria and hematuria.  Musculoskeletal:  Positive for arthralgias. Negative for neck pain and neck stiffness.  Skin:  Negative for rash.  Neurological:  Negative for dizziness and  weakness.  Psychiatric/Behavioral:  Negative for agitation and behavioral problems.       Objective:    Physical Exam Vitals reviewed.  Constitutional:      General: She is not in acute distress.    Appearance: She is obese. She is not diaphoretic.  HENT:     Head: Normocephalic and atraumatic.     Nose: Congestion present.     Mouth/Throat:     Mouth: Mucous membranes are moist.     Pharynx: Posterior oropharyngeal erythema present.  Eyes:     General: No scleral icterus.    Extraocular Movements: Extraocular movements intact.  Cardiovascular:     Rate and Rhythm: Normal rate and regular rhythm.     Heart sounds: Normal heart sounds. No murmur heard. Pulmonary:     Breath sounds: Normal breath sounds. No wheezing or rales.  Musculoskeletal:     Cervical back: Neck supple. No tenderness.     Right lower leg: No edema.     Left lower leg: No edema.  Skin:    General: Skin is warm.  Neurological:     General: No focal deficit present.     Mental Status: She is alert and oriented to person, place, and time.     Sensory: No sensory deficit.     Motor: No weakness.  Psychiatric:        Mood and Affect: Mood normal.        Behavior: Behavior normal.     BP 128/70 (BP Location: Left Arm)   Pulse 87   Ht 5' 3 (1.6 m)   Wt 140 lb 6.4 oz (63.7 kg)   SpO2 90%   BMI 24.87 kg/m  Wt Readings from Last 3 Encounters:  02/24/24 140 lb 6.4 oz (63.7 kg)  02/09/24 143 lb 4 oz (65 kg)  02/03/24 149 lb 11.2 oz (67.9 kg)    Lab Results  Component Value Date   TSH 3.783 06/27/2023   Lab Results  Component Value Date   WBC 5.4 02/09/2024   HGB 7.6 (L) 02/09/2024   HCT 22.9 (L) 02/09/2024   MCV 97.0 02/09/2024   PLT 422 (H) 02/09/2024   Lab Results  Component Value Date   NA 139 02/09/2024   K 4.1 02/09/2024   CO2 28 02/09/2024   GLUCOSE 118 (H) 02/09/2024   BUN 13 02/09/2024   CREATININE 1.01 (H) 02/09/2024   BILITOT 0.3 02/09/2024   ALKPHOS 97 02/09/2024   AST  23 02/09/2024   ALT 13 02/09/2024   PROT 7.1 02/09/2024   ALBUMIN 3.7 02/09/2024   CALCIUM  8.9 02/09/2024   ANIONGAP 6 02/09/2024   EGFR 42 (L) 06/24/2023   GFR 74.79 07/15/2013   Lab Results  Component Value Date   CHOL 219 (H) 10/10/2022   Lab Results  Component Value Date   HDL 53 10/10/2022   Lab Results  Component Value Date   LDLCALC 145 (H) 10/10/2022   Lab Results  Component Value Date   TRIG 118 10/10/2022   Lab Results  Component Value Date   CHOLHDL 4.1 10/10/2022   Lab Results  Component Value Date   HGBA1C 8.1 (H) 06/24/2023      Assessment & Plan:   Problem List Items Addressed This Visit       Cardiovascular and Mediastinum   Hypertension - Primary   BP Readings from Last 1 Encounters:  02/24/24 128/70   Well-controlled with Losartan  100 mg once daily and nifedipine  60 mg once daily Counseled for compliance with the medications Advised DASH diet and moderate exercise/walking, at least 150 mins/week      Nonischemic cardiomyopathy (HCC)   On Losartan  Had to stop Coreg  due to bradycardia Appears euvolemic Followed by Cardiology        Respiratory   Acute non-recurrent sinusitis   Considering persistent symptoms despite symptomatic treatment, started empiric azithromycin  Advised to use Flonase  as needed for nasal congestion/allergies Tessalon  as needed for cough      Relevant Medications   azithromycin  (ZITHROMAX ) 250 MG tablet   benzonatate  (TESSALON ) 200 MG capsule     Digestive   Pancreatic adenocarcinoma (HCC)   Followed by GI and oncology - undergoing chemotherapy      Relevant Medications   azithromycin  (ZITHROMAX ) 250 MG tablet     Endocrine   Hypothyroidism   Lab Results  Component Value Date   TSH 3.783 06/27/2023   On NP thyroid  90 mg QD, had decreased dose in the past due to oversupplementation Due to recent weight loss, recheck TSH and free T4 Had discussed about switching to Levothyroxine , she would prefer  to stay on NP thyroid  for now      Relevant Orders   TSH + free T4   Type 2 diabetes mellitus with other specified complication (HCC)   POC HbA1c: 7.2 in 04/25, improved than prior Well-controlled for her age, check HbA1c On Basaglar  12 units nightly and NovoLog  ISS Associated with HTN and HLD Metformin  discontinued from recent hospitalization Avoid sulfonylureas or incretin analogs due to pancreatic mass Advised to follow diabetic diet On ARB F/u CMP and lipid panel Diabetic eye exam: Advised to follow up with Ophthalmology for diabetic eye exam      Relevant Orders   Hemoglobin A1c     Hematopoietic and Hemostatic   Pancytopenia due to chemotherapy (HCC)   Last CBC reviewed Followed by Hemato-Oncology          Meds ordered this encounter  Medications   azithromycin  (ZITHROMAX ) 250 MG tablet    Sig: Take 2 tablets on day 1, then 1 tablet daily on days 2 through 5    Dispense:  6 tablet    Refill:  0   benzonatate  (TESSALON ) 200 MG capsule    Sig: Take 1 capsule (200 mg total) by mouth 2 (two) times daily as needed for cough.    Dispense:  20 capsule    Refill:  0    Follow-up: Return in about 4 months (around 06/25/2024) for DM.    Suzzane MARLA Blanch, MD

## 2024-02-24 NOTE — Assessment & Plan Note (Signed)
 Last CBC reviewed Followed by Hemato-Oncology

## 2024-02-24 NOTE — Assessment & Plan Note (Signed)
 Followed by GI and oncology - undergoing chemotherapy

## 2024-02-25 ENCOUNTER — Telehealth: Payer: Self-pay | Admitting: Hematology

## 2024-02-25 ENCOUNTER — Ambulatory Visit: Payer: Self-pay | Admitting: Internal Medicine

## 2024-02-25 LAB — HEMOGLOBIN A1C
Est. average glucose Bld gHb Est-mCnc: 128 mg/dL
Hgb A1c MFr Bld: 6.1 % — ABNORMAL HIGH (ref 4.8–5.6)

## 2024-02-25 LAB — TSH+FREE T4
Free T4: 0.87 ng/dL (ref 0.82–1.77)
TSH: 9.38 u[IU]/mL — ABNORMAL HIGH (ref 0.450–4.500)

## 2024-02-25 NOTE — Telephone Encounter (Signed)
Pt called in to reschedule her appt

## 2024-02-26 ENCOUNTER — Other Ambulatory Visit

## 2024-02-26 ENCOUNTER — Inpatient Hospital Stay (HOSPITAL_BASED_OUTPATIENT_CLINIC_OR_DEPARTMENT_OTHER)
Admission: EM | Admit: 2024-02-26 | Discharge: 2024-02-29 | DRG: 193 | Disposition: A | Discharge status: Home Health | Attending: Internal Medicine | Admitting: Internal Medicine

## 2024-02-26 ENCOUNTER — Other Ambulatory Visit: Payer: Self-pay | Admitting: Internal Medicine

## 2024-02-26 ENCOUNTER — Emergency Department (HOSPITAL_BASED_OUTPATIENT_CLINIC_OR_DEPARTMENT_OTHER)

## 2024-02-26 ENCOUNTER — Ambulatory Visit: Admitting: Hematology

## 2024-02-26 ENCOUNTER — Other Ambulatory Visit: Payer: Self-pay

## 2024-02-26 DIAGNOSIS — J189 Pneumonia, unspecified organism: Secondary | ICD-10-CM | POA: Diagnosis not present

## 2024-02-26 DIAGNOSIS — Z8249 Family history of ischemic heart disease and other diseases of the circulatory system: Secondary | ICD-10-CM | POA: Diagnosis not present

## 2024-02-26 DIAGNOSIS — Z7951 Long term (current) use of inhaled steroids: Secondary | ICD-10-CM | POA: Diagnosis not present

## 2024-02-26 DIAGNOSIS — C259 Malignant neoplasm of pancreas, unspecified: Secondary | ICD-10-CM | POA: Diagnosis not present

## 2024-02-26 DIAGNOSIS — I509 Heart failure, unspecified: Secondary | ICD-10-CM | POA: Diagnosis present

## 2024-02-26 DIAGNOSIS — Z833 Family history of diabetes mellitus: Secondary | ICD-10-CM

## 2024-02-26 DIAGNOSIS — Z888 Allergy status to other drugs, medicaments and biological substances status: Secondary | ICD-10-CM | POA: Diagnosis not present

## 2024-02-26 DIAGNOSIS — Z87891 Personal history of nicotine dependence: Secondary | ICD-10-CM | POA: Diagnosis not present

## 2024-02-26 DIAGNOSIS — E039 Hypothyroidism, unspecified: Secondary | ICD-10-CM | POA: Diagnosis not present

## 2024-02-26 DIAGNOSIS — E785 Hyperlipidemia, unspecified: Secondary | ICD-10-CM | POA: Diagnosis present

## 2024-02-26 DIAGNOSIS — D630 Anemia in neoplastic disease: Secondary | ICD-10-CM | POA: Diagnosis not present

## 2024-02-26 DIAGNOSIS — D649 Anemia, unspecified: Secondary | ICD-10-CM | POA: Diagnosis present

## 2024-02-26 DIAGNOSIS — E1169 Type 2 diabetes mellitus with other specified complication: Secondary | ICD-10-CM | POA: Diagnosis not present

## 2024-02-26 DIAGNOSIS — I428 Other cardiomyopathies: Secondary | ICD-10-CM | POA: Diagnosis not present

## 2024-02-26 DIAGNOSIS — Z96643 Presence of artificial hip joint, bilateral: Secondary | ICD-10-CM | POA: Diagnosis not present

## 2024-02-26 DIAGNOSIS — R0789 Other chest pain: Secondary | ICD-10-CM | POA: Diagnosis not present

## 2024-02-26 DIAGNOSIS — Z86000 Personal history of in-situ neoplasm of breast: Secondary | ICD-10-CM | POA: Diagnosis not present

## 2024-02-26 DIAGNOSIS — M109 Gout, unspecified: Secondary | ICD-10-CM | POA: Diagnosis not present

## 2024-02-26 DIAGNOSIS — J9601 Acute respiratory failure with hypoxia: Principal | ICD-10-CM | POA: Diagnosis present

## 2024-02-26 DIAGNOSIS — Z9011 Acquired absence of right breast and nipple: Secondary | ICD-10-CM

## 2024-02-26 DIAGNOSIS — R918 Other nonspecific abnormal finding of lung field: Secondary | ICD-10-CM | POA: Diagnosis not present

## 2024-02-26 DIAGNOSIS — R0602 Shortness of breath: Secondary | ICD-10-CM | POA: Diagnosis not present

## 2024-02-26 DIAGNOSIS — Z803 Family history of malignant neoplasm of breast: Secondary | ICD-10-CM | POA: Diagnosis not present

## 2024-02-26 DIAGNOSIS — G4730 Sleep apnea, unspecified: Secondary | ICD-10-CM | POA: Diagnosis present

## 2024-02-26 DIAGNOSIS — Z1152 Encounter for screening for COVID-19: Secondary | ICD-10-CM

## 2024-02-26 DIAGNOSIS — Z79899 Other long term (current) drug therapy: Secondary | ICD-10-CM

## 2024-02-26 DIAGNOSIS — I110 Hypertensive heart disease with heart failure: Secondary | ICD-10-CM | POA: Diagnosis present

## 2024-02-26 DIAGNOSIS — C250 Malignant neoplasm of head of pancreas: Secondary | ICD-10-CM | POA: Diagnosis present

## 2024-02-26 DIAGNOSIS — Z794 Long term (current) use of insulin: Secondary | ICD-10-CM | POA: Diagnosis not present

## 2024-02-26 DIAGNOSIS — I10 Essential (primary) hypertension: Secondary | ICD-10-CM | POA: Diagnosis not present

## 2024-02-26 LAB — COMPREHENSIVE METABOLIC PANEL WITH GFR
ALT: 15 U/L (ref 0–44)
AST: 42 U/L — ABNORMAL HIGH (ref 15–41)
Albumin: 3.5 g/dL (ref 3.5–5.0)
Alkaline Phosphatase: 104 U/L (ref 38–126)
Anion gap: 15 (ref 5–15)
BUN: 21 mg/dL (ref 8–23)
CO2: 21 mmol/L — ABNORMAL LOW (ref 22–32)
Calcium: 10.1 mg/dL (ref 8.9–10.3)
Chloride: 99 mmol/L (ref 98–111)
Creatinine, Ser: 1.24 mg/dL — ABNORMAL HIGH (ref 0.44–1.00)
GFR, Estimated: 45 mL/min — ABNORMAL LOW (ref >60)
Glucose, Bld: 132 mg/dL — ABNORMAL HIGH (ref 70–99)
Potassium: 4.1 mmol/L (ref 3.5–5.1)
Sodium: 135 mmol/L (ref 135–145)
Total Bilirubin: 0.6 mg/dL (ref 0.0–1.2)
Total Protein: 8 g/dL (ref 6.5–8.1)

## 2024-02-26 LAB — CBC WITH DIFFERENTIAL/PLATELET
Abs Immature Granulocytes: 0.1 K/uL — ABNORMAL HIGH (ref 0.00–0.07)
Basophils Absolute: 0 K/uL (ref 0.0–0.1)
Basophils Relative: 0 %
Eosinophils Absolute: 0 K/uL (ref 0.0–0.5)
Eosinophils Relative: 0 %
HCT: 29.8 % — ABNORMAL LOW (ref 36.0–46.0)
Hemoglobin: 9.9 g/dL — ABNORMAL LOW (ref 12.0–15.0)
Immature Granulocytes: 1 %
Lymphocytes Relative: 12 %
Lymphs Abs: 2 K/uL (ref 0.7–4.0)
MCH: 30.8 pg (ref 26.0–34.0)
MCHC: 33.2 g/dL (ref 30.0–36.0)
MCV: 92.8 fL (ref 80.0–100.0)
Monocytes Absolute: 1.7 K/uL — ABNORMAL HIGH (ref 0.1–1.0)
Monocytes Relative: 10 %
Neutro Abs: 12.6 K/uL — ABNORMAL HIGH (ref 1.7–7.7)
Neutrophils Relative %: 77 %
Platelets: 702 K/uL — ABNORMAL HIGH (ref 150–400)
RBC: 3.21 MIL/uL — ABNORMAL LOW (ref 3.87–5.11)
RDW: 18 % — ABNORMAL HIGH (ref 11.5–15.5)
WBC: 16.5 K/uL — ABNORMAL HIGH (ref 4.0–10.5)
nRBC: 0 % (ref 0.0–0.2)

## 2024-02-26 LAB — RESP PANEL BY RT-PCR (RSV, FLU A&B, COVID)  RVPGX2
Influenza A by PCR: NEGATIVE
Influenza B by PCR: NEGATIVE
Resp Syncytial Virus by PCR: NEGATIVE
SARS Coronavirus 2 by RT PCR: NEGATIVE

## 2024-02-26 LAB — TROPONIN T, HIGH SENSITIVITY: Troponin T High Sensitivity: 29 ng/L — ABNORMAL HIGH (ref 0–19)

## 2024-02-26 LAB — PRO BRAIN NATRIURETIC PEPTIDE: Pro Brain Natriuretic Peptide: 1015 pg/mL — ABNORMAL HIGH (ref <300.0)

## 2024-02-26 LAB — LACTIC ACID, PLASMA: Lactic Acid, Venous: 1.5 mmol/L (ref 0.5–1.9)

## 2024-02-26 MED ORDER — FUROSEMIDE 10 MG/ML IJ SOLN
40.0000 mg | Freq: Once | INTRAMUSCULAR | Status: AC
  Administered 2024-02-26: 40 mg via INTRAVENOUS
  Filled 2024-02-26: qty 4

## 2024-02-26 MED ORDER — SODIUM CHLORIDE 0.9 % IV SOLN
1.0000 g | Freq: Once | INTRAVENOUS | Status: AC
  Administered 2024-02-26: 1 g via INTRAVENOUS
  Filled 2024-02-26: qty 10

## 2024-02-26 MED ORDER — ONETOUCH DELICA PLUS LANCET33G MISC: 1.0000 | Freq: Three times a day (TID) | 0 refills | Status: DC

## 2024-02-26 MED ORDER — SODIUM CHLORIDE 0.9 % IV SOLN
500.0000 mg | Freq: Once | INTRAVENOUS | Status: AC
  Administered 2024-02-26: 500 mg via INTRAVENOUS
  Filled 2024-02-26: qty 5

## 2024-02-26 NOTE — ED Triage Notes (Signed)
 Patient states she had a cough for the past several days. PCP placed patient on antibiotics. Patient states taking antibiotics but shortness of breath has worsened. Patient 85% on room air in triage.

## 2024-02-26 NOTE — Telephone Encounter (Signed)
 Copied from CRM [id number]. Topic: Clinical - Medication Refill >> Feb 26, 2024 10:31 AM Delon T wrote: Medication: Lancets JANETT CATHRYNE CORALYN VARA) MISC [[id number]]  Has the patient contacted their pharmacy? Yes (Agent: If no, request that the patient contact the pharmacy for the refill. If patient does not wish to contact the pharmacy document the reason why and proceed with request.) (Agent: If yes, when and what did the pharmacy advise?)  This is the patient's preferred pharmacy:  [id number] - [place], [place] - [address] [place] [address] Phone: [phone] Fax: [phone]   Is this the correct pharmacy for this prescription? Yes If no, delete pharmacy and type the correct one.   Has the prescription been filled recently? Yes  Is the patient out of the medication? Yes  Has the patient been seen for an appointment in the last year OR does the patient have an upcoming appointment? Yes  Can we respond through MyChart? Yes  Agent: Please be advised that Rx refills may take up to 3 business days. We ask that you follow-up with your pharmacy.

## 2024-02-26 NOTE — ED Notes (Signed)
 Attempted to obtain 2nd set of cultures, unsuccessful.

## 2024-02-26 NOTE — ED Notes (Signed)
 [facility] called for transport

## 2024-02-26 NOTE — ED Notes (Signed)
 Port accessed using sterile technique. Pt tastes the saline, but no blood return at this time.

## 2024-02-26 NOTE — ED Provider Notes (Signed)
 Adrienne [name] Provider Note   CSN: [id number] Arrival date & time: 02/26/24  1341     Patient presents with: Shortness of Breath   Adrienne [name] is a 78 y.o. female.   Adrienne [name] is a 78 y.o. female with a history of hypertension, hyperlipidemia, CHF, type 2 diabetes, pancreatic adenocarcinoma, who presents to the emergency department for evaluation of shortness of breath and cough.  Patient reports that about 7 days ago she started to experience a severe cough.  She reports that she has been coughing up mucus.  She went to see her primary care provider regarding this on Tuesday, 2 days ago and was prescribed azithromycin  and Tessalon  Perles.  She reports that she has been taking antibiotics as directed but today started feeling increasingly short of breath.  She reports that even just walking to the bathroom in her house she was significantly more winded than normal.  She does not wear any oxygen at home.  She denies associated chest pain.  She does not know if she has had any fevers.  No other aggravating or alleviating factors.  No known sick contacts.  Patient is not currently receiving any chemotherapy for her pancreatic cancer.  The history is provided by the patient and medical records.  Shortness of Breath Associated symptoms: cough   Associated symptoms: no abdominal pain, no chest pain, no fever and no vomiting        Prior to Admission medications   Medication Sig Start Date End Date Taking? Authorizing Provider  azithromycin  (ZITHROMAX ) 250 MG tablet Take 2 tablets on day 1, then 1 tablet daily on days 2 through 5 02/24/24 02/29/24  [name], MD  benzonatate  (TESSALON ) 200 MG capsule Take 1 capsule (200 mg total) by mouth 2 (two) times daily as needed for cough. 02/24/24   [name], MD  Cholecalciferol (VITAMIN D -3) 125 MCG (5000 UT) TABS Take 1 tablet by mouth daily.    [provider]   cyanocobalamin (VITAMIN B12) 1000 MCG tablet Take 1,000 mcg by mouth daily.    [provider]  fluticasone  (FLONASE ) 50 MCG/ACT nasal spray Place 2 sprays into both nostrils daily. Patient not taking: Reported on 01/28/2024 12/19/23   Soldatova, Liuba, MD  Glucose Blood (BLOOD GLUCOSE TEST STRIPS) STRP 1 each by In Vitro route in the morning, at noon, and at bedtime. May substitute to any manufacturer covered by patient's insurance. 07/22/23   [name], MD  insulin  aspart (NOVOLOG ) 100 UNIT/ML FlexPen Inject 0-6 Units into the skin 3 (three) times daily with meals. Check Blood Glucose (BG) and inject per scale: BG <150= 0 unit; BG 150-200= 1 unit; BG 201-250= 2 unit; BG 251-300= 3 unit; BG 301-350= 4 unit; BG 351-400= 5 unit; BG >400= 6 unit and Call Primary Care. 06/28/23   Rai, Nydia POUR, MD  Insulin  Glargine (BASAGLAR  KWIKPEN) 100 UNIT/ML Inject 12 Units into the skin daily. May substitute as needed per insurance. Patient taking differently: Inject 12 Units into the skin at bedtime. 06/28/23   Rai, Nydia POUR, MD  Lancets (ONETOUCH DELICA PLUS Avis) MISC 1 each by Does not apply route 3 (three) times daily. 02/26/24   [name], MD  lidocaine -prilocaine  (EMLA ) cream Apply 1 Application topically as needed. 07/23/23   [name], MD  losartan  (COZAAR ) 100 MG tablet Take 1 tablet (100 mg total) by mouth daily. 10/21/23   [name], MD  NIFEdipine  (ADALAT   CC) 60 MG 24 hr tablet Take 1 tablet (60 mg total) by mouth daily. 10/21/23   [name], MD  ondansetron  (ZOFRAN ) 8 MG tablet Take 1 tablet (8 mg total) by mouth every 8 (eight) hours as needed for nausea or vomiting. Patient not taking: Reported on 01/28/2024 07/23/23   [name], MD  prochlorperazine  (COMPAZINE ) 10 MG tablet Take 1 tablet (10 mg total) by mouth every 6 (six) hours as needed for nausea or vomiting. 07/23/23   [name], MD  RELION PEN NEEDLES 31G X 6 MM MISC USE 1  THREE TIMES DAILY 11/26/23   [name],  MD  thyroid  (NP THYROID ) 90 MG tablet Take 1 tablet (90 mg total) by mouth daily. 10/21/23   [name], MD    Allergies: Lisinopril , Oxaliplatin , Statins, and Atorvastatin     Review of Systems  Constitutional:  Negative for chills and fever.  HENT: Negative.    Respiratory:  Positive for cough and shortness of breath.   Cardiovascular:  Negative for chest pain, palpitations and leg swelling.  Gastrointestinal:  Negative for abdominal pain, nausea and vomiting.  Genitourinary:  Negative for dysuria and frequency.  Musculoskeletal:  Negative for arthralgias.  Neurological:  Negative for syncope.    Updated Vital Signs BP (!) 127/106   Pulse 70   Temp 98.9 F (37.2 C) (Oral)   Resp (!) 29   SpO2 98%   Physical Exam Vitals and nursing note reviewed.  Constitutional:      General: She is not in acute distress.    Appearance: Normal appearance. She is well-developed. She is not diaphoretic.     Comments: Alert elderly female in no acute distress  HENT:     Head: Normocephalic and atraumatic.  Eyes:     General:        Right eye: No discharge.        Left eye: No discharge.  Cardiovascular:     Rate and Rhythm: Normal rate and regular rhythm.     Pulses: Normal pulses.     Heart sounds: Normal heart sounds.  Pulmonary:     Effort: No respiratory distress.     Breath sounds: Rales present. No wheezing.     Comments: Patient tachypneic with slightly increased respiratory effort, sats of 84% on room air, improved to 98% on 2 L nasal cannula.  On auscultation slightly decreased breath sounds throughout with faint crackles noted. Abdominal:     General: Bowel sounds are normal. There is no distension.     Palpations: Abdomen is soft. There is no mass.     Tenderness: There is no abdominal tenderness. There is no guarding.     Comments: Abdomen soft, nondistended, nontender to palpation in all quadrants without guarding or peritoneal signs  Musculoskeletal:        General:  No deformity.     Cervical back: Neck supple.  Skin:    General: Skin is warm and dry.     Capillary Refill: Capillary refill takes less than 2 seconds.  Neurological:     Mental Status: She is alert and oriented to person, place, and time.     Coordination: Coordination normal.     Comments: Speech is clear, able to follow commands Moves extremities without ataxia, coordination intact  Psychiatric:        Mood and Affect: Mood normal.        Behavior: Behavior normal.     (all labs ordered are listed, but  only abnormal results are displayed) Labs Reviewed  COMPREHENSIVE METABOLIC PANEL WITH GFR - Abnormal; Notable for the following components:      Result Value   CO2 21 (*)    Glucose, Bld 132 (*)    Creatinine, Ser 1.24 (*)    AST 42 (*)    GFR, Estimated 45 (*)    All other components within normal limits  CBC WITH DIFFERENTIAL/PLATELET - Abnormal; Notable for the following components:   WBC 16.5 (*)    RBC 3.21 (*)    Hemoglobin 9.9 (*)    HCT 29.8 (*)    RDW 18.0 (*)    Platelets 702 (*)    Neutro Abs 12.6 (*)    Monocytes Absolute 1.7 (*)    Abs Immature Granulocytes 0.10 (*)    All other components within normal limits  PRO BRAIN NATRIURETIC PEPTIDE - Abnormal; Notable for the following components:   Pro Brain Natriuretic Peptide 1,015.0 (*)    All other components within normal limits  TROPONIN T, HIGH SENSITIVITY - Abnormal; Notable for the following components:   Troponin T High Sensitivity 29 (*)    All other components within normal limits  RESP PANEL BY RT-PCR (RSV, FLU A&B, COVID)  RVPGX2  CULTURE, BLOOD (ROUTINE X 2)  CULTURE, BLOOD (ROUTINE X 2)  LACTIC ACID, PLASMA  PROCALCITONIN    EKG: None  Radiology: DG Chest Port 1 View Result Date: 02/26/2024 CLINICAL DATA:  Shortness of breath EXAM: PORTABLE CHEST 1 VIEW COMPARISON:  Chest radiograph dated 06/26/2023 FINDINGS: Lines/tubes: Left chest wall port tip projects over the superior cavoatrial  junction. Lungs: Well inflated lungs. Increased diffuse patchy and interstitial opacities in the right lung and left lung base Pleura: Trace blunting of bilateral costophrenic angles. No pneumothorax. Heart/mediastinum: Similar enlarged cardiomediastinal silhouette. Bones: No acute osseous abnormality. IMPRESSION: 1. Increased diffuse patchy and interstitial opacities in the right lung and left lung base, which may represent pulmonary edema or multifocal pneumonia. 2. Trace blunting of bilateral costophrenic angles, which may represent trace pleural effusions. Electronically Signed   By: Limin  Xu M.D.   On: 02/26/2024 15:30     Procedures   Medications Ordered in the ED  azithromycin  (ZITHROMAX ) 500 mg in sodium chloride  0.9 % 250 mL IVPB (500 mg Intravenous New Bag/Given 02/26/24 1741)  cefTRIAXone  (ROCEPHIN ) 1 g in sodium chloride  0.9 % 100 mL IVPB (0 g Intravenous Stopped 02/26/24 1735)  furosemide  (LASIX ) injection 40 mg (40 mg Intravenous Given 02/26/24 1658)                                    Medical Decision Making Amount and/or Complexity of Data Reviewed Labs: ordered. Radiology: ordered.  Risk Prescription drug management. Decision regarding hospitalization.   Patient presents with shortness of breath and cough.  Noted to be hypoxic to 84% on arrival, placed on 2 L nasal cannula with improvement, vital signs otherwise stable.  Recently started on antibiotics for possible pneumonia.  Worsening shortness of breath today.  On auscultation she has some faint crackles noted throughout.  Laboratory evaluation is significant for leukocytosis of 16.5, stable chronic anemia, slightly worsened kidney function with creatinine of 1.24, baseline around 1.0, no other significant electrolyte derangements, lactic acid is within normal limits.  Blood cultures pending as well as procalcitonin.  Patient has mildly elevated troponin with delta pending.  BNP is significantly elevated at 1015.  COVID  flu  and RSV panel is negative.  Chest x-ray ordered, viewed and interpreted independently diffuse patchy opacities noted concerning for pulmonary edema versus multifocal pneumonia.  Patient does not have lower extremity swelling.  Patient with leukocytosis but also elevated BNP will treat with antibiotics as well as Lasix  for potential CHF versus community-acquired pneumonia.  Patient will require hospital admission for acute hypoxic respiratory failure  Case discussed with Dr. Sonjia with [facility] who accepts patient for transfer and admission.     Final diagnoses:  Acute hypoxic respiratory failure [facility])    ED Discharge Orders     None          Alva Larraine FALCON, PA-C 02/26/24 1841    Pamella Ozell LABOR, DO 03/02/24 1524

## 2024-02-26 NOTE — ED Notes (Signed)
 Called [place] [facility] and gave report to [facility]

## 2024-02-27 ENCOUNTER — Encounter (HOSPITAL_COMMUNITY): Payer: Self-pay | Admitting: Family Medicine

## 2024-02-27 DIAGNOSIS — J189 Pneumonia, unspecified organism: Secondary | ICD-10-CM | POA: Diagnosis present

## 2024-02-27 DIAGNOSIS — J9601 Acute respiratory failure with hypoxia: Secondary | ICD-10-CM | POA: Diagnosis not present

## 2024-02-27 DIAGNOSIS — D649 Anemia, unspecified: Secondary | ICD-10-CM | POA: Diagnosis present

## 2024-02-27 LAB — GLUCOSE, CAPILLARY
Glucose-Capillary: 108 mg/dL — ABNORMAL HIGH (ref 70–99)
Glucose-Capillary: 132 mg/dL — ABNORMAL HIGH (ref 70–99)
Glucose-Capillary: 154 mg/dL — ABNORMAL HIGH (ref 70–99)
Glucose-Capillary: 165 mg/dL — ABNORMAL HIGH (ref 70–99)
Glucose-Capillary: 166 mg/dL — ABNORMAL HIGH (ref 70–99)

## 2024-02-27 LAB — CBC
HCT: 31.1 % — ABNORMAL LOW (ref 36.0–46.0)
Hemoglobin: 9.7 g/dL — ABNORMAL LOW (ref 12.0–15.0)
MCH: 30.1 pg (ref 26.0–34.0)
MCHC: 31.2 g/dL (ref 30.0–36.0)
MCV: 96.6 fL (ref 80.0–100.0)
Platelets: 670 K/uL — ABNORMAL HIGH (ref 150–400)
RBC: 3.22 MIL/uL — ABNORMAL LOW (ref 3.87–5.11)
RDW: 18 % — ABNORMAL HIGH (ref 11.5–15.5)
WBC: 15.1 K/uL — ABNORMAL HIGH (ref 4.0–10.5)
nRBC: 0 % (ref 0.0–0.2)

## 2024-02-27 LAB — TROPONIN T, HIGH SENSITIVITY: Troponin T High Sensitivity: 26 ng/L — ABNORMAL HIGH (ref 0–19)

## 2024-02-27 LAB — BASIC METABOLIC PANEL WITH GFR
Anion gap: 14 (ref 5–15)
BUN: 19 mg/dL (ref 8–23)
CO2: 22 mmol/L (ref 22–32)
Calcium: 9.6 mg/dL (ref 8.9–10.3)
Chloride: 103 mmol/L (ref 98–111)
Creatinine, Ser: 0.89 mg/dL (ref 0.44–1.00)
GFR, Estimated: >60 mL/min (ref >60)
Glucose, Bld: 126 mg/dL — ABNORMAL HIGH (ref 70–99)
Potassium: 3.9 mmol/L (ref 3.5–5.1)
Sodium: 139 mmol/L (ref 135–145)

## 2024-02-27 LAB — PROCALCITONIN: Procalcitonin: 0.31 ng/mL

## 2024-02-27 LAB — MAGNESIUM: Magnesium: 1.7 mg/dL (ref 1.7–2.4)

## 2024-02-27 MED ORDER — ACETAMINOPHEN 325 MG PO TABS: 650.0000 mg | ORAL_TABLET | Freq: Four times a day (QID) | ORAL | Status: DC | PRN

## 2024-02-27 MED ORDER — INSULIN ASPART 100 UNIT/ML IJ SOLN
0.0000 [IU] | Freq: Every day | INTRAMUSCULAR | Status: DC
  Administered 2024-02-28: 2 [IU] via SUBCUTANEOUS

## 2024-02-27 MED ORDER — ACETAMINOPHEN 650 MG RE SUPP: 650.0000 mg | Freq: Four times a day (QID) | RECTAL | Status: DC | PRN

## 2024-02-27 MED ORDER — SODIUM CHLORIDE 0.9 % IV SOLN
100.0000 mg | Freq: Two times a day (BID) | INTRAVENOUS | Status: DC
  Administered 2024-02-27 (×2): 100 mg via INTRAVENOUS
  Filled 2024-02-27 (×2): qty 100

## 2024-02-27 MED ORDER — ALTEPLASE 2 MG IJ SOLR
2.0000 mg | Freq: Once | INTRAMUSCULAR | Status: AC
  Administered 2024-02-27: 2 mg
  Filled 2024-02-27: qty 2

## 2024-02-27 MED ORDER — FLUTICASONE PROPIONATE 50 MCG/ACT NA SUSP
2.0000 | Freq: Every day | NASAL | Status: DC
  Administered 2024-02-27 – 2024-02-29 (×3): 2 via NASAL
  Filled 2024-02-27: qty 16

## 2024-02-27 MED ORDER — ENOXAPARIN SODIUM 40 MG/0.4ML IJ SOSY
40.0000 mg | PREFILLED_SYRINGE | INTRAMUSCULAR | Status: DC
  Administered 2024-02-27 – 2024-02-29 (×3): 40 mg via SUBCUTANEOUS
  Filled 2024-02-27 (×3): qty 0.4

## 2024-02-27 MED ORDER — NIFEDIPINE ER OSMOTIC RELEASE 60 MG PO TB24
60.0000 mg | ORAL_TABLET | Freq: Every day | ORAL | Status: DC
  Administered 2024-02-27 – 2024-02-29 (×3): 60 mg via ORAL
  Filled 2024-02-27 (×3): qty 1

## 2024-02-27 MED ORDER — DOXYCYCLINE HYCLATE 100 MG PO TABS
100.0000 mg | ORAL_TABLET | Freq: Two times a day (BID) | ORAL | Status: DC
  Administered 2024-02-27 – 2024-02-29 (×4): 100 mg via ORAL
  Filled 2024-02-27 (×4): qty 1

## 2024-02-27 MED ORDER — INSULIN ASPART 100 UNIT/ML IJ SOLN
0.0000 [IU] | Freq: Three times a day (TID) | INTRAMUSCULAR | Status: DC
  Administered 2024-02-27: 1 [IU] via SUBCUTANEOUS
  Administered 2024-02-29: 2 [IU] via SUBCUTANEOUS

## 2024-02-27 MED ORDER — SENNOSIDES-DOCUSATE SODIUM 8.6-50 MG PO TABS: 1.0000 | ORAL_TABLET | Freq: Every evening | ORAL | Status: DC | PRN

## 2024-02-27 MED ORDER — GUAIFENESIN 100 MG/5ML PO LIQD
5.0000 mL | ORAL | Status: DC | PRN
  Administered 2024-02-28 – 2024-02-29 (×4): 5 mL via ORAL
  Filled 2024-02-27 (×4): qty 10

## 2024-02-27 MED ORDER — CHLORHEXIDINE GLUCONATE CLOTH 2 % EX PADS
6.0000 | MEDICATED_PAD | Freq: Every day | CUTANEOUS | Status: DC
  Administered 2024-02-27 – 2024-02-29 (×3): 6 via TOPICAL

## 2024-02-27 MED ORDER — ORAL CARE MOUTH RINSE: 15.0000 mL | OROMUCOSAL | Status: DC | PRN

## 2024-02-27 MED ORDER — POTASSIUM CHLORIDE CRYS ER 10 MEQ PO TBCR
10.0000 meq | EXTENDED_RELEASE_TABLET | Freq: Once | ORAL | Status: AC
  Administered 2024-02-27: 10 meq via ORAL
  Filled 2024-02-27: qty 1

## 2024-02-27 MED ORDER — PROCHLORPERAZINE EDISYLATE 10 MG/2ML IJ SOLN: 5.0000 mg | Freq: Four times a day (QID) | INTRAMUSCULAR | Status: DC | PRN

## 2024-02-27 MED ORDER — SODIUM CHLORIDE 0.9 % IV SOLN
2.0000 g | INTRAVENOUS | Status: DC
  Administered 2024-02-27 – 2024-02-29 (×3): 2 g via INTRAVENOUS
  Filled 2024-02-27 (×3): qty 20

## 2024-02-27 MED ORDER — MAGNESIUM SULFATE IN D5W 1-5 GM/100ML-% IV SOLN
1.0000 g | Freq: Once | INTRAVENOUS | Status: AC
  Administered 2024-02-27: 1 g via INTRAVENOUS
  Filled 2024-02-27: qty 100

## 2024-02-27 MED ORDER — THYROID 60 MG PO TABS
90.0000 mg | ORAL_TABLET | Freq: Every day | ORAL | Status: DC
  Administered 2024-02-27 – 2024-02-29 (×3): 90 mg via ORAL
  Filled 2024-02-27 (×3): qty 1

## 2024-02-27 MED ORDER — LORATADINE 10 MG PO TABS
10.0000 mg | ORAL_TABLET | Freq: Every day | ORAL | Status: DC
  Filled 2024-02-27 (×3): qty 1

## 2024-02-27 MED ORDER — SODIUM CHLORIDE 0.9% FLUSH: 10.0000 mL | INTRAVENOUS | Status: DC | PRN

## 2024-02-27 MED ORDER — SODIUM CHLORIDE 0.9% FLUSH
3.0000 mL | Freq: Two times a day (BID) | INTRAVENOUS | Status: DC
  Administered 2024-02-27 – 2024-02-28 (×5): 3 mL via INTRAVENOUS

## 2024-02-27 MED ORDER — ENSURE PLUS HIGH PROTEIN PO LIQD: 237.0000 mL | Freq: Two times a day (BID) | ORAL | Status: DC

## 2024-02-27 NOTE — Progress Notes (Signed)
 PROGRESS NOTE    Adrienne [name]  FMW:[id number] DOB: 1945-09-28 DOA: 02/26/2024 PCP: [name], MD  Outpatient Specialists:     Brief Narrative:  Patient is a 78 year old female past medical history significant for congestive heart failure, gout, hyperlipidemia, hypertension, small bowel obstruction, type 2 diabetes mellitus and sleep apnea.  Patient has history of breast cancer status postlumpectomy, nonischemic cardiomyopathy, greater than 20 pack years and pancreatic cancer undergoing chemotherapy.  Patient was admitted with worsening shortness of breath and worsening cough.  Significant leukocytosis on presentation.  Patient is currently on IV Rocephin  and oral doxycycline  for pneumonia.  02/27/2024: Patient seen.  Patient continues to report coughing.  There is associated runny nose.  Procalcitonin of 0.31.  Will start patient on Flonase  and loratadine .  Assessment & Plan:   Principal Problem:   Acute respiratory failure with hypoxia (HCC) Active Problems:   Essential hypertension   Hypothyroidism   Nonischemic cardiomyopathy (HCC)   Type 2 diabetes mellitus with other specified complication (HCC)   Pancreatic adenocarcinoma (HCC)   Normocytic anemia   Multifocal pneumonia   1. Multifocal pneumonia; acute hypoxic respiratory failure  - Presents with productive cough and SOB and found to be hypoxic with new leukocytosis, pro-BNP 1015, and edema vs multifocal PNA on CXR  - With new leukocytosis and sputum production, and absence of peripheral edema, JVD, or orthopnea, suspect this is pneumonia  - Check procalcitonin, continue antibiotics and supplemental O2, follow cultures and clinical course   By 525: Continue antibiotics.   2. NICM  - EF was 45-50% with global hypokinesis and grade 2 diastolic dysfunction on TTE form March 2024  - She was given 40 mg IV Lasix  at [facility], appears compensated  - Monitor weight and I/Os   02/27/2024: Currently compensated.   3.  Hypertension  - Hold losartan  and treat as-needed only for now in light of increased creatinine and some marginal BP readings  02/27/2024: Blood pressure is controlled.   4. Pancreatic cancer  - Undergoing treatment with gemcitabine  under the care of Dr. Lanny and being considered for surgery at [facility]    5. Type II DM  - A1c was 6.1% this month  - Check CBGs and use low-intensity SSI only for now  02/27/2024: Continue to monitor and optimize.   6. Anemia  - Appears stable, no overt bleeding      DVT prophylaxis: Lovenox . Code Status: Full code. Family Communication:  Disposition Plan: Inpatient.   Consultants:  None.  Procedures:  None  Antimicrobials:  IV Rocephin . Oral doxycycline .   Subjective: No new complaints. Patient continues to cough.  Objective: Vitals:   02/27/24 0449 02/27/24 0800 02/27/24 1000 02/27/24 1200  BP:  116/68  112/62  Pulse:  72  68  Resp:      Temp:  98.1 F (36.7 C)  98.3 F (36.8 C)  TempSrc:  Oral  Oral  SpO2:  95% 92% 95%  Weight: 62.4 kg     Height:        Intake/Output Summary (Last 24 hours) at 02/27/2024 1528 Last data filed at 02/27/2024 1330 Gross per 24 hour  Intake 623.69 ml  Output 1700 ml  Net -1076.31 ml   Filed Weights   02/26/24 2342 02/27/24 0449  Weight: 62.2 kg 62.4 kg    Examination:  General exam: Appears calm and comfortable. Respiratory system: Clear to auscultation.  Cardiovascular system: S1 & S2 heard Gastrointestinal system: Abdomen is soft and nontender.  Central nervous system:  Alert and oriented. No focal neurological deficits. Extremities: No leg edema  Data Reviewed: I have personally reviewed following labs and imaging studies  CBC: Recent Labs  Lab 02/26/24 1417 02/27/24 0428  WBC 16.5* 15.1*  NEUTROABS 12.6*  --   HGB 9.9* 9.7*  HCT 29.8* 31.1*  MCV 92.8 96.6  PLT 702* 670*   Basic Metabolic Panel: Recent Labs  Lab 02/26/24 1417 02/27/24 0428  NA 135 139  K 4.1 3.9  CL 99  103  CO2 21* 22  GLUCOSE 132* 126*  BUN 21 19  CREATININE 1.24* 0.89  CALCIUM  10.1 9.6  MG  --  1.7   GFR: Estimated Creatinine Clearance: 44.5 mL/min (by C-G formula based on SCr of 0.89 mg/dL). Liver Function Tests: Recent Labs  Lab 02/26/24 1417  AST 42*  ALT 15  ALKPHOS 104  BILITOT 0.6  PROT 8.0  ALBUMIN 3.5   No results for input(s): LIPASE, AMYLASE in the last 168 hours. No results for input(s): AMMONIA in the last 168 hours. Coagulation Profile: No results for input(s): INR, PROTIME in the last 168 hours. Cardiac Enzymes: No results for input(s): CKTOTAL, CKMB, CKMBINDEX, TROPONINI in the last 168 hours. BNP (last 3 results) Recent Labs    02/26/24 1417  PROBNP 1,015.0*   HbA1C: No results for input(s): HGBA1C in the last 72 hours. CBG: Recent Labs  Lab 02/27/24 0045 02/27/24 0753 02/27/24 1148  GLUCAP 154* 108* 165*   Lipid Profile: No results for input(s): CHOL, HDL, LDLCALC, TRIG, CHOLHDL, LDLDIRECT in the last 72 hours. Thyroid  Function Tests: No results for input(s): TSH, T4TOTAL, FREET4, T3FREE, THYROIDAB in the last 72 hours. Anemia Panel: No results for input(s): VITAMINB12, FOLATE, FERRITIN, TIBC, IRON, RETICCTPCT in the last 72 hours. Urine analysis:    Component Value Date/Time   COLORURINE YELLOW 06/26/2023 2155   APPEARANCEUR CLEAR 06/26/2023 2155   LABSPEC 1.023 06/26/2023 2155   PHURINE 5.0 06/26/2023 2155   GLUCOSEU >=500 (A) 06/26/2023 2155   HGBUR NEGATIVE 06/26/2023 2155   BILIRUBINUR NEGATIVE 06/26/2023 2155   KETONESUR NEGATIVE 06/26/2023 2155   PROTEINUR 30 (A) 06/26/2023 2155   NITRITE NEGATIVE 06/26/2023 2155   LEUKOCYTESUR NEGATIVE 06/26/2023 2155   Sepsis Labs: @LABRCNTIP (procalcitonin:4,lacticidven:4)  ) Recent Results (from the past 240 hours)  Resp panel by RT-PCR (RSV, Flu A&B, Covid) Anterior Nasal Swab     Status: None   Collection Time: 02/26/24  2:19 PM    Specimen: Anterior Nasal Swab  Result Value Ref Range Status   SARS Coronavirus 2 by RT PCR NEGATIVE NEGATIVE Final    Comment: (NOTE) SARS-CoV-2 target nucleic acids are NOT DETECTED.  The SARS-CoV-2 RNA is generally detectable in upper respiratory specimens during the acute phase of infection. The lowest concentration of SARS-CoV-2 viral copies this assay can detect is 138 copies/mL. A negative result does not preclude SARS-Cov-2 infection and should not be used as the sole basis for treatment or other patient management decisions. A negative result may occur with  improper specimen collection/handling, submission of specimen other than nasopharyngeal swab, presence of viral mutation(s) within the areas targeted by this assay, and inadequate number of viral copies(<138 copies/mL). A negative result must be combined with clinical observations, patient history, and epidemiological information. The expected result is Negative.  Fact Sheet for Patients:  BloggerCourse.com  Fact Sheet for Healthcare Providers:  SeriousBroker.it  This test is no t yet approved or cleared by the [place]  FDA and  has been authorized for detection  and/or diagnosis of SARS-CoV-2 by FDA under an Emergency Use Authorization (EUA). This EUA will remain  in effect (meaning this test can be used) for the duration of the COVID-19 declaration under Section 564(b)(1) of the Act, 21 U.S.C.section 360bbb-3(b)(1), unless the authorization is terminated  or revoked sooner.       Influenza A by PCR NEGATIVE NEGATIVE Final   Influenza B by PCR NEGATIVE NEGATIVE Final    Comment: (NOTE) The Xpert Xpress SARS-CoV-2/FLU/RSV plus assay is intended as an aid in the diagnosis of influenza from Nasopharyngeal swab specimens and should not be used as a sole basis for treatment. Nasal washings and aspirates are unacceptable for Xpert Xpress  SARS-CoV-2/FLU/RSV testing.  Fact Sheet for Patients: BloggerCourse.com  Fact Sheet for Healthcare Providers: SeriousBroker.it  This test is not yet approved or cleared by the [place]  FDA and has been authorized for detection and/or diagnosis of SARS-CoV-2 by FDA under an Emergency Use Authorization (EUA). This EUA will remain in effect (meaning this test can be used) for the duration of the COVID-19 declaration under Section 564(b)(1) of the Act, 21 U.S.C. section 360bbb-3(b)(1), unless the authorization is terminated or revoked.     Resp Syncytial Virus by PCR NEGATIVE NEGATIVE Final    Comment: (NOTE) Fact Sheet for Patients: BloggerCourse.com  Fact Sheet for Healthcare Providers: SeriousBroker.it  This test is not yet approved or cleared by the [place]  FDA and has been authorized for detection and/or diagnosis of SARS-CoV-2 by FDA under an Emergency Use Authorization (EUA). This EUA will remain in effect (meaning this test can be used) for the duration of the COVID-19 declaration under Section 564(b)(1) of the Act, 21 U.S.C. section 360bbb-3(b)(1), unless the authorization is terminated or revoked.  Performed at [facility], [address] [place], [place] [address]   Culture, blood (routine x 2)     Status: None (Preliminary result)   Collection Time: 02/26/24  2:19 PM   Specimen: BLOOD  Result Value Ref Range Status   Specimen Description   Final    BLOOD LEFT ANTECUBITAL Performed at [facility], [address], [place], [place] [address]    Special Requests   Final    BOTTLES DRAWN AEROBIC AND ANAEROBIC Blood Culture adequate volume Performed at [facility], [address] [place], [place] [address]    Culture   Final    NO GROWTH < 24 HOURS Performed at [facility], [address] [place], [place] [address]    Report Status PENDING  Incomplete  Culture, blood (routine x 2)     Status: None (Preliminary result)   Collection Time: 02/27/24 12:04 AM   Specimen: BLOOD  Result Value Ref Range Status   Specimen Description   Final    BLOOD SITE NOT SPECIFIED Performed at [facility], [address], [place], [place] [address]    Special Requests   Final    Blood Culture results may not be optimal due to an inadequate volume of blood received in culture bottles BOTTLES DRAWN AEROBIC AND ANAEROBIC Performed at [facility], [address], [place], [place] [address]    Culture   Final    NO GROWTH < 12 HOURS Performed at [facility], [address], [place], [place] [address]    Report Status PENDING  Incomplete         Radiology Studies: DG Chest Port 1 View Result Date: 02/26/2024 CLINICAL DATA:  Shortness of breath EXAM: PORTABLE CHEST 1 VIEW COMPARISON:  Chest radiograph dated 06/26/2023 FINDINGS: Lines/tubes: Left chest wall port tip projects over the superior cavoatrial junction. Lungs: Well inflated lungs. Increased diffuse patchy and interstitial opacities in the right lung and left lung base Pleura: Trace blunting of bilateral costophrenic angles. No pneumothorax. Heart/mediastinum: Similar enlarged cardiomediastinal silhouette. Bones: No acute osseous abnormality. IMPRESSION: 1. Increased diffuse patchy and interstitial opacities in the right lung and left lung base, which may represent pulmonary edema or multifocal pneumonia. 2. Trace blunting of bilateral costophrenic angles, which may represent trace pleural effusions. Electronically Signed   By: Limin  Xu M.D.   On: 02/26/2024 15:30        Scheduled Meds:  Chlorhexidine  Gluconate Cloth  6 each Topical Daily   doxycycline   100 mg Oral Q12H   enoxaparin  (LOVENOX ) injection  40 mg Subcutaneous Q24H   feeding supplement  237 mL Oral BID BM   insulin   aspart  0-5 Units Subcutaneous QHS   insulin  aspart  0-6 Units Subcutaneous TID WC   NIFEdipine   60 mg Oral Daily   sodium chloride  flush  3 mL Intravenous Q12H   thyroid   90 mg Oral Q0600   Continuous Infusions:  cefTRIAXone  (ROCEPHIN )  IV       LOS: 1 day    Time spent: 55 minutes    Leatrice Chapel, MD  [facility] Pager #: [phone] 7PM-7AM contact night coverage as above

## 2024-02-27 NOTE — Progress Notes (Signed)
 Adrienne [name] requested HCPOA paperwork but does not wish to fill it out right now.  She plans to look it over and will reach back out if she has questions.

## 2024-02-27 NOTE — H&P (Signed)
 History and Physical    Taaliyah Delpriore FMW:[id number] DOB: [birth year] DOA: 02/26/2024  PCP: [name], MD   Patient coming from: Home   Chief Complaint: Cough, SOB   HPI: Ariea Rochin is a 78 y.o. female with medical history significant for hypertension, hyperlipidemia, type 2 diabetes mellitus, hypothyroidism, nonischemic cardiomyopathy, breast cancer status postlumpectomy, and pancreatic cancer undergoing chemotherapy who presents with worsening cough and shortness of breath.  Patient had been experiencing sinus congestion, rhinorrhea, sore throat, and nonproductive cough for several days when she saw her PCP on 02/24/2024, was diagnosed with sinusitis, and given azithromycin  and Tessalon .  Later that night, she began to develop exertional dyspnea.  Since then, she has persistent cough which is often productive of thick sputum and worsening shortness of breath.  She denies any chest pain or leg swelling associated with this and has not noticed any orthopnea, fever, or chills.  [facility] ED Course: Upon arrival to the ED, patient is found to be afebrile and saturating in the mid 80s on room air with tachypnea, normal HR, and stable BP.  Labs are most notable for creatinine 1.24, WBC 16,500, hemoglobin 9.9, platelets 12/31/1998, normal lactic acid, and proBNP 1015.  Blood culture was collected in the ED and the patient was given IV Lasix , Rocephin , and azithromycin .  She was transferred to [facility] for admission.  Review of Systems:  All other systems reviewed and apart from HPI, are negative.  Past Medical History:  Diagnosis Date   Allergy    Ambulates with cane    straight cane   Arthritis    knee, back   CHF (congestive heart failure) (HCC)    Ductal carcinoma in situ of breast 12/2018   R Breast-mastectomy only   Gout    History of blood transfusion 1986   w/ Hysterectomy surgery   Hyperlipidemia    diet controlled - no meds    Hypertension    Hypothyroidism    Pelvic kidney    lower right pelvic kidney    SBO (small bowel obstruction) (HCC) 10/2016   surgery    Sleep apnea    Mild - no mask needed per sleep study   Smoker    quit smoking 2018   Type 2 diabetes mellitus (HCC)     Past Surgical History:  Procedure Laterality Date   ABDOMINAL HYSTERECTOMY  1986   COMPLETE-precancerous   APPENDECTOMY     pt states it was removed when gallbladder was removed.   AUGMENTATION MAMMAPLASTY     BACK SURGERY     lower back   BILIARY BRUSHING  07/08/2023   Procedure: BILIARY BRUSHING;  Surgeon: Wilhelmenia Aloha Raddle., MD;  Location: [facility];  Service: Gastroenterology;;   BILIARY STENT PLACEMENT N/A 07/08/2023   Procedure: BILIARY STENT PLACEMENT;  Surgeon: Wilhelmenia Aloha Raddle., MD;  Location: [facility];  Service: Gastroenterology;  Laterality: N/A;   BIOPSY  06/14/2023   Procedure: BIOPSY;  Surgeon: Avram Lupita BRAVO, MD;  Location: [facility];  Service: Gastroenterology;;   BIOPSY  07/08/2023   Procedure: BIOPSY;  Surgeon: Wilhelmenia Aloha Raddle., MD;  Location: [facility];  Service: Gastroenterology;;   BREAST BIOPSY Right 05/12/2019   times 2   BREAST LUMPECTOMY WITH RADIOACTIVE SEED LOCALIZATION Right 03/18/2019   Procedure: RIGHT BREAST LUMPECTOMY WITH RADIOACTIVE SEED LOCALIZATION;  Surgeon: [name], MD;  Location: [facility];  Service: General;  Laterality: Right;   CHOLECYSTECTOMY     COLONOSCOPY  02/15/2008  Kaplan    COLONOSCOPY WITH PROPOFOL   02/27/2018   Dr.Nandigam   ECTOPIC PREGNANCY SURGERY     ERCP N/A 06/14/2023   Procedure: ENDOSCOPIC RETROGRADE CHOLANGIOPANCREATOGRAPHY (ERCP);  Surgeon: Avram Lupita BRAVO, MD;  Location: [facility];  Service: Gastroenterology;  Laterality: N/A;   ERCP N/A 06/27/2023   Procedure: ENDOSCOPIC RETROGRADE CHOLANGIOPANCREATOGRAPHY (ERCP);  Surgeon: Charlanne Groom, MD;  Location: [facility];  Service: Gastroenterology;  Laterality: N/A;   ERCP N/A  07/08/2023   Procedure: ENDOSCOPIC RETROGRADE CHOLANGIOPANCREATOGRAPHY (ERCP);  Surgeon: Wilhelmenia Aloha Raddle., MD;  Location: [facility];  Service: Gastroenterology;  Laterality: N/A;   ESOPHAGOGASTRODUODENOSCOPY N/A 07/08/2023   Procedure: ESOPHAGOGASTRODUODENOSCOPY (EGD);  Surgeon: Wilhelmenia Aloha Raddle., MD;  Location: [facility];  Service: Gastroenterology;  Laterality: N/A;   ESOPHAGOGASTRODUODENOSCOPY (EGD) WITH PROPOFOL  N/A 06/14/2023   Procedure: ESOPHAGOGASTRODUODENOSCOPY (EGD) WITH PROPOFOL ;  Surgeon: Avram Lupita BRAVO, MD;  Location: [facility];  Service: Gastroenterology;  Laterality: N/A;   EUS N/A 07/08/2023   Procedure: UPPER ENDOSCOPIC ULTRASOUND (EUS) LINEAR;  Surgeon: Wilhelmenia Aloha Raddle., MD;  Location: [facility];  Service: Gastroenterology;  Laterality: N/A;   FINE NEEDLE ASPIRATION N/A 07/08/2023   Procedure: FINE NEEDLE ASPIRATION (FNA) LINEAR;  Surgeon: Wilhelmenia Aloha Raddle., MD;  Location: [facility];  Service: Gastroenterology;  Laterality: N/A;   IR IMAGING GUIDED PORT INSERTION  07/29/2023   JOINT REPLACEMENT     Left hip total Dr. Melodi 10-01-17   MALONEY DILATION  06/14/2023   Procedure: AGAPITO DILATION;  Surgeon: Avram Lupita BRAVO, MD;  Location: [facility];  Service: Gastroenterology;;   MASTECTOMY Right 07/26/2019   PANCREATIC STENT PLACEMENT  06/14/2023   Procedure: PANCREATIC STENT PLACEMENT;  Surgeon: Avram Lupita BRAVO, MD;  Location: [facility];  Service: Gastroenterology;;   PANCREATIC STENT PLACEMENT  06/27/2023   Procedure: PANCREATIC STENT PLACEMENT;  Surgeon: Charlanne Groom, MD;  Location: [facility];  Service: Gastroenterology;;   POLYPECTOMY     polypectomy-oropharynx     RE-EXCISION OF BREAST CANCER,SUPERIOR MARGINS Right 04/08/2019   Procedure: RE-EXCISION OF RIGHT BREAST ANTERIOR MARGINS;  Surgeon: Curvin Mt III, MD;  Location: [facility];  Service: General;  Laterality: Right;   right knee meniscus     SBO  10/2016   small bowel  obstruction   SIMPLE MASTECTOMY WITH AXILLARY SENTINEL NODE BIOPSY Right 07/26/2019   Procedure: RIGHT MASTECTOMY WITH SENTINEL NODE BIOPSY;  Surgeon: Curvin Mt MOULD, MD;  Location: [facility];  Service: General;  Laterality: Right;   SPHINCTEROTOMY  06/27/2023   Procedure: SPHINCTEROTOMY;  Surgeon: Charlanne Groom, MD;  Location: [facility];  Service: Gastroenterology;;   ANNETT  07/08/2023   Procedure: ANNETT;  Surgeon: Wilhelmenia Aloha Raddle., MD;  Location: [facility];  Service: Gastroenterology;;   TOTAL HIP ARTHROPLASTY Left 10/01/2017   Procedure: LEFT  TOTAL HIP ARTHROPLASTY ANTERIOR APPROACH;  Surgeon: Melodi Lerner, MD;  Location: [facility];  Service: Orthopedics;  Laterality: Left;   TOTAL HIP ARTHROPLASTY Right 03/11/2018   Procedure: RIGHT TOTAL HIP ARTHROPLASTY ANTERIOR APPROACH;  Surgeon: Melodi Lerner, MD;  Location: [facility];  Service: Orthopedics;  Laterality: Right;    UPPER GASTROINTESTINAL ENDOSCOPY      Social History:   reports that she quit smoking about 7 years ago. Her smoking use included e-cigarettes and cigarettes. She started smoking about 39 years ago. She has a 32 pack-year smoking history. She has never used smokeless tobacco. She reports that she does not drink alcohol and does not use drugs.  Allergies  Allergen Reactions  Lisinopril  Swelling    Angioedema   Oxaliplatin  Shortness Of Breath and Swelling   Statins     Myalgias   Atorvastatin  Other (See Comments)    Muscle aches     Family History  Problem Relation Age of Onset   Cancer Sister    Diabetes Brother    Diabetes Brother    Hypertension Other    Breast cancer Sister    Colon cancer Neg Hx    Colon polyps Neg Hx    Rectal cancer Neg Hx    Stomach cancer Neg Hx      Prior to Admission medications   Medication Sig Start Date End Date Taking? Authorizing Provider  azithromycin  (ZITHROMAX ) 250 MG tablet Take 2 tablets on day 1, then 1 tablet daily on  days 2 through 5 02/24/24 02/29/24  [name], MD  benzonatate  (TESSALON ) 200 MG capsule Take 1 capsule (200 mg total) by mouth 2 (two) times daily as needed for cough. 02/24/24   [name], MD  Cholecalciferol (VITAMIN D -3) 125 MCG (5000 UT) TABS Take 1 tablet by mouth daily.    [provider]  cyanocobalamin (VITAMIN B12) 1000 MCG tablet Take 1,000 mcg by mouth daily.    [provider]  fluticasone  (FLONASE ) 50 MCG/ACT nasal spray Place 2 sprays into both nostrils daily. Patient not taking: Reported on 01/28/2024 12/19/23   Soldatova, [place], MD  Glucose Blood (BLOOD GLUCOSE TEST STRIPS) STRP 1 each by In Vitro route in the morning, at noon, and at bedtime. May substitute to any manufacturer covered by patient's insurance. 07/22/23   [name], MD  insulin  aspart (NOVOLOG ) 100 UNIT/ML FlexPen Inject 0-6 Units into the skin 3 (three) times daily with meals. Check Blood Glucose (BG) and inject per scale: BG <150= 0 unit; BG 150-200= 1 unit; BG 201-250= 2 unit; BG 251-300= 3 unit; BG 301-350= 4 unit; BG 351-400= 5 unit; BG >400= 6 unit and Call Primary Care. 06/28/23   Rai, Nydia POUR, MD  Insulin  Glargine (BASAGLAR  KWIKPEN) 100 UNIT/ML Inject 12 Units into the skin daily. May substitute as needed per insurance. Patient taking differently: Inject 12 Units into the skin at bedtime. 06/28/23   Rai, Nydia POUR, MD  Lancets (ONETOUCH DELICA PLUS [place]) MISC 1 each by Does not apply route 3 (three) times daily. 02/26/24   [name], MD  lidocaine -prilocaine  (EMLA ) cream Apply 1 Application topically as needed. 07/23/23   [name], MD  losartan  (COZAAR ) 100 MG tablet Take 1 tablet (100 mg total) by mouth daily. 10/21/23   [name], MD  NIFEdipine  (ADALAT  CC) 60 MG 24 hr tablet Take 1 tablet (60 mg total) by mouth daily. 10/21/23   [name], MD  ondansetron  (ZOFRAN ) 8 MG tablet Take 1 tablet (8 mg total) by mouth every 8 (eight) hours as needed for nausea or  vomiting. Patient not taking: Reported on 01/28/2024 07/23/23   [name], MD  prochlorperazine  (COMPAZINE ) 10 MG tablet Take 1 tablet (10 mg total) by mouth every 6 (six) hours as needed for nausea or vomiting. 07/23/23   [name], MD  RELION PEN NEEDLES 31G X 6 MM MISC USE 1  THREE TIMES DAILY 11/26/23   [name], MD  thyroid  (NP THYROID ) 90 MG tablet Take 1 tablet (90 mg total) by mouth daily. 10/21/23   [name], MD    Physical Exam: Vitals:   02/26/24 2200 02/26/24 2230 02/26/24 2336 02/26/24 2342  BP: (!) 112/56 105/65 115/62   Pulse: 66 63 66   Resp: (!) 24 (!) 22 16   Temp:   98.2 F (36.8 C)   TempSrc:   Oral   SpO2: 95% 95% 91% 93%  Weight:    62.2 kg  Height:    5' 3 (1.6 m)    Constitutional: NAD, no pallor or diaphoresis   Eyes: PERTLA, lids and conjunctivae normal ENMT: Mucous membranes are moist. Posterior pharynx clear of any exudate or lesions.   Neck: supple, no masses  Respiratory: Speaking full sentences. Scattered rales. No wheezing.  Cardiovascular: S1 & S2 heard, regular rate and rhythm. No extremity edema.  Abdomen: No tenderness, soft. Bowel sounds active.  Musculoskeletal: no clubbing / cyanosis. No joint deformity upper and lower extremities.   Skin: no significant rashes, lesions, ulcers. Warm, dry, well-perfused. Neurologic: CN 2-12 grossly intact. Moving all extremities. Alert and oriented.  Psychiatric: Calm. Cooperative.    Labs and Imaging on Admission: I have personally reviewed following labs and imaging studies  CBC: Recent Labs  Lab 02/26/24 1417  WBC 16.5*  NEUTROABS 12.6*  HGB 9.9*  HCT 29.8*  MCV 92.8  PLT 702*   Basic Metabolic Panel: Recent Labs  Lab 02/26/24 1417  NA 135  K 4.1  CL 99  CO2 21*  GLUCOSE 132*  BUN 21  CREATININE 1.24*  CALCIUM  10.1   GFR: Estimated Creatinine Clearance: 31.9 mL/min (A) (by C-G formula based on SCr of 1.24 mg/dL (H)). Liver Function Tests: Recent Labs  Lab 02/26/24 1417   AST 42*  ALT 15  ALKPHOS 104  BILITOT 0.6  PROT 8.0  ALBUMIN 3.5   No results for input(s): LIPASE, AMYLASE in the last 168 hours. No results for input(s): AMMONIA in the last 168 hours. Coagulation Profile: No results for input(s): INR, PROTIME in the last 168 hours. Cardiac Enzymes: No results for input(s): CKTOTAL, CKMB, CKMBINDEX, TROPONINI in the last 168 hours. BNP (last 3 results) Recent Labs    02/26/24 1417  PROBNP 1,015.0*   HbA1C: Recent Labs    02/24/24 0907  HGBA1C 6.1*   CBG: No results for input(s): GLUCAP in the last 168 hours. Lipid Profile: No results for input(s): CHOL, HDL, LDLCALC, TRIG, CHOLHDL, LDLDIRECT in the last 72 hours. Thyroid  Function Tests: Recent Labs    02/24/24 0907  TSH 9.380*  FREET4 0.87   Anemia Panel: No results for input(s): VITAMINB12, FOLATE, FERRITIN, TIBC, IRON, RETICCTPCT in the last 72 hours. Urine analysis:    Component Value Date/Time   COLORURINE YELLOW 06/26/2023 2155   APPEARANCEUR CLEAR 06/26/2023 2155   LABSPEC 1.023 06/26/2023 2155   PHURINE 5.0 06/26/2023 2155   GLUCOSEU >=500 (A) 06/26/2023 2155   HGBUR NEGATIVE 06/26/2023 2155   BILIRUBINUR NEGATIVE 06/26/2023 2155   KETONESUR NEGATIVE 06/26/2023 2155   PROTEINUR 30 (A) 06/26/2023 2155   NITRITE NEGATIVE 06/26/2023 2155   LEUKOCYTESUR NEGATIVE 06/26/2023 2155   Sepsis Labs: @LABRCNTIP (procalcitonin:4,lacticidven:4) ) Recent Results (from the past 240 hours)  Resp panel by RT-PCR (RSV, Flu A&B, Covid) Anterior Nasal Swab     Status: None   Collection Time: 02/26/24  2:19 PM   Specimen: Anterior Nasal Swab  Result Value Ref Range Status   SARS Coronavirus 2 by RT PCR NEGATIVE NEGATIVE Final    Comment: (NOTE) SARS-CoV-2 target nucleic acids are NOT DETECTED.  The SARS-CoV-2 RNA is generally detectable in upper respiratory specimens during the acute phase of infection. The lowest concentration  of  SARS-CoV-2 viral copies this assay can detect is 138 copies/mL. A negative result does not preclude SARS-Cov-2 infection and should not be used as the sole basis for treatment or other patient management decisions. A negative result may occur with  improper specimen collection/handling, submission of specimen other than nasopharyngeal swab, presence of viral mutation(s) within the areas targeted by this assay, and inadequate number of viral copies(<138 copies/mL). A negative result must be combined with clinical observations, patient history, and epidemiological information. The expected result is Negative.  Fact Sheet for Patients:  BloggerCourse.com  Fact Sheet for Healthcare Providers:  SeriousBroker.it  This test is no t yet approved or cleared by the [place]  FDA and  has been authorized for detection and/or diagnosis of SARS-CoV-2 by FDA under an Emergency Use Authorization (EUA). This EUA will remain  in effect (meaning this test can be used) for the duration of the COVID-19 declaration under Section 564(b)(1) of the Act, 21 U.S.C.section 360bbb-3(b)(1), unless the authorization is terminated  or revoked sooner.       Influenza A by PCR NEGATIVE NEGATIVE Final   Influenza B by PCR NEGATIVE NEGATIVE Final    Comment: (NOTE) The Xpert Xpress SARS-CoV-2/FLU/RSV plus assay is intended as an aid in the diagnosis of influenza from Nasopharyngeal swab specimens and should not be used as a sole basis for treatment. Nasal washings and aspirates are unacceptable for Xpert Xpress SARS-CoV-2/FLU/RSV testing.  Fact Sheet for Patients: BloggerCourse.com  Fact Sheet for Healthcare Providers: SeriousBroker.it  This test is not yet approved or cleared by the [place]  FDA and has been authorized for detection and/or diagnosis of SARS-CoV-2 by FDA under an Emergency Use  Authorization (EUA). This EUA will remain in effect (meaning this test can be used) for the duration of the COVID-19 declaration under Section 564(b)(1) of the Act, 21 U.S.C. section 360bbb-3(b)(1), unless the authorization is terminated or revoked.     Resp Syncytial Virus by PCR NEGATIVE NEGATIVE Final    Comment: (NOTE) Fact Sheet for Patients: BloggerCourse.com  Fact Sheet for Healthcare Providers: SeriousBroker.it  This test is not yet approved or cleared by the [place]  FDA and has been authorized for detection and/or diagnosis of SARS-CoV-2 by FDA under an Emergency Use Authorization (EUA). This EUA will remain in effect (meaning this test can be used) for the duration of the COVID-19 declaration under Section 564(b)(1) of the Act, 21 U.S.C. section 360bbb-3(b)(1), unless the authorization is terminated or revoked.  Performed at [facility], [address], [place], [place] [address]      Radiological Exams on Admission: DG Chest Port 1 View Result Date: 02/26/2024 CLINICAL DATA:  Shortness of breath EXAM: PORTABLE CHEST 1 VIEW COMPARISON:  Chest radiograph dated 06/26/2023 FINDINGS: Lines/tubes: Left chest wall port tip projects over the superior cavoatrial junction. Lungs: Well inflated lungs. Increased diffuse patchy and interstitial opacities in the right lung and left lung base Pleura: Trace blunting of bilateral costophrenic angles. No pneumothorax. Heart/mediastinum: Similar enlarged cardiomediastinal silhouette. Bones: No acute osseous abnormality. IMPRESSION: 1. Increased diffuse patchy and interstitial opacities in the right lung and left lung base, which may represent pulmonary edema or multifocal pneumonia. 2. Trace blunting of bilateral costophrenic angles, which may represent trace pleural effusions. Electronically Signed   By: Limin  Xu M.D.   On: 02/26/2024 15:30    EKG: Independently  reviewed. Ectopic atrial rhythm, RBBB, LVH with IVCD and repolarization abnormality.   Assessment/Plan   1. Multifocal pneumonia; acute hypoxic  respiratory failure  - Presents with productive cough and SOB and found to be hypoxic with new leukocytosis, pro-BNP 1015, and edema vs multifocal PNA on CXR  - With new leukocytosis and sputum production, and absence of peripheral edema, JVD, or orthopnea, suspect this is pneumonia  - Check procalcitonin, continue antibiotics and supplemental O2, follow cultures and clinical course    2. NICM  - EF was 45-50% with global hypokinesis and grade 2 diastolic dysfunction on TTE form March 2024  - She was given 40 mg IV Lasix  at [facility], appears compensated  - Monitor weight and I/Os    3. Hypertension  - Hold losartan  and treat as-needed only for now in light of increased creatinine and some marginal BP readings   4. Pancreatic cancer  - Undergoing treatment with gemcitabine  under the care of Dr. Lanny and being considered for surgery at [facility]   5. Type II DM  - A1c was 6.1% this month  - Check CBGs and use low-intensity SSI only for now   6. Anemia  - Appears stable, no overt bleeding    DVT prophylaxis: Lovenox   Code Status: Full  Level of Care: Level of care: Telemetry Family Communication: None present  Disposition Plan:  Patient is from: Home  Anticipated d/c is to: TBD Anticipated d/c date is: 02/29/24 Patient currently: Pending improved respiratory status  Consults called: none  Admission status: Inpatient     Evalene GORMAN Sprinkles, MD [facility]  02/27/2024, 12:25 AM

## 2024-02-27 NOTE — Progress Notes (Signed)
 Chaplains received a spiritual consult requesting assistance with HCPOA paperwork.  Adrienne [name] was resting at time of visit.  Will attempt at a later time.

## 2024-02-28 DIAGNOSIS — J9601 Acute respiratory failure with hypoxia: Secondary | ICD-10-CM | POA: Diagnosis not present

## 2024-02-28 LAB — BASIC METABOLIC PANEL WITH GFR
Anion gap: 11 (ref 5–15)
BUN: 12 mg/dL (ref 8–23)
CO2: 23 mmol/L (ref 22–32)
Calcium: 9.3 mg/dL (ref 8.9–10.3)
Chloride: 102 mmol/L (ref 98–111)
Creatinine, Ser: 0.73 mg/dL (ref 0.44–1.00)
GFR, Estimated: >60 mL/min (ref >60)
Glucose, Bld: 89 mg/dL (ref 70–99)
Potassium: 3.8 mmol/L (ref 3.5–5.1)
Sodium: 136 mmol/L (ref 135–145)

## 2024-02-28 LAB — GLUCOSE, CAPILLARY
Glucose-Capillary: 99 mg/dL (ref 70–99)
Glucose-Capillary: 142 mg/dL — ABNORMAL HIGH (ref 70–99)
Glucose-Capillary: 144 mg/dL — ABNORMAL HIGH (ref 70–99)
Glucose-Capillary: 241 mg/dL — ABNORMAL HIGH (ref 70–99)
Glucose-Capillary: 240 mg/dL — ABNORMAL HIGH (ref 70–99)

## 2024-02-28 LAB — CBC WITH DIFFERENTIAL/PLATELET
Abs Immature Granulocytes: 0.13 K/uL — ABNORMAL HIGH (ref 0.00–0.07)
Basophils Absolute: 0 K/uL (ref 0.0–0.1)
Basophils Relative: 0 %
Eosinophils Absolute: 0.3 K/uL (ref 0.0–0.5)
Eosinophils Relative: 2 %
HCT: 27.8 % — ABNORMAL LOW (ref 36.0–46.0)
Hemoglobin: 8.7 g/dL — ABNORMAL LOW (ref 12.0–15.0)
Immature Granulocytes: 1 %
Lymphocytes Relative: 24 %
Lymphs Abs: 3.5 K/uL (ref 0.7–4.0)
MCH: 30.1 pg (ref 26.0–34.0)
MCHC: 31.3 g/dL (ref 30.0–36.0)
MCV: 96.2 fL (ref 80.0–100.0)
Monocytes Absolute: 2.1 K/uL — ABNORMAL HIGH (ref 0.1–1.0)
Monocytes Relative: 14 %
Neutro Abs: 8.3 K/uL — ABNORMAL HIGH (ref 1.7–7.7)
Neutrophils Relative %: 59 %
Platelets: 657 K/uL — ABNORMAL HIGH (ref 150–400)
RBC: 2.89 MIL/uL — ABNORMAL LOW (ref 3.87–5.11)
RDW: 17.8 % — ABNORMAL HIGH (ref 11.5–15.5)
WBC: 14.3 K/uL — ABNORMAL HIGH (ref 4.0–10.5)
nRBC: 0 % (ref 0.0–0.2)

## 2024-02-28 MED ORDER — ALBUTEROL SULFATE (2.5 MG/3ML) 0.083% IN NEBU: 2.5000 mg | INHALATION_SOLUTION | Freq: Four times a day (QID) | RESPIRATORY_TRACT | Status: DC | PRN

## 2024-02-28 MED ORDER — IPRATROPIUM-ALBUTEROL 0.5-2.5 (3) MG/3ML IN SOLN
3.0000 mL | Freq: Four times a day (QID) | RESPIRATORY_TRACT | Status: DC
  Administered 2024-02-28 (×2): 3 mL via RESPIRATORY_TRACT
  Filled 2024-02-28 (×2): qty 3

## 2024-02-28 MED ORDER — METHYLPREDNISOLONE SODIUM SUCC 40 MG IJ SOLR
40.0000 mg | Freq: Every day | INTRAMUSCULAR | Status: DC
  Administered 2024-02-28 – 2024-02-29 (×2): 40 mg via INTRAVENOUS
  Filled 2024-02-28 (×2): qty 1

## 2024-02-28 MED ORDER — IPRATROPIUM-ALBUTEROL 0.5-2.5 (3) MG/3ML IN SOLN
3.0000 mL | Freq: Four times a day (QID) | RESPIRATORY_TRACT | Status: DC
  Administered 2024-02-29 (×2): 3 mL via RESPIRATORY_TRACT
  Filled 2024-02-28 (×3): qty 3

## 2024-02-28 MED ORDER — BUDESONIDE 0.25 MG/2ML IN SUSP
0.2500 mg | Freq: Two times a day (BID) | RESPIRATORY_TRACT | Status: DC
  Administered 2024-02-28 – 2024-02-29 (×3): 0.25 mg via RESPIRATORY_TRACT
  Filled 2024-02-28 (×3): qty 2

## 2024-02-28 NOTE — Progress Notes (Signed)
   02/28/24 1347  Oxygen Therapy/Pulse Ox  O2 Device (S)  Room Air  O2 Therapy (S)  Room air  SpO2 (!) (S)  85 % (Applied 2 L [place] post neb tx given with 02.)  Safety Instructions (S)  Yes (Comment)

## 2024-02-28 NOTE — Progress Notes (Signed)
 PROGRESS NOTE    Adrienne [name]  FMW:[id number] DOB: Dec 12, 1945 DOA: 02/26/2024 PCP: [name], MD  Outpatient Specialists:     Brief Narrative:  Patient is a 78 year old female past medical history significant for congestive heart failure, gout, hyperlipidemia, hypertension, small bowel obstruction, type 2 diabetes mellitus and sleep apnea.  Patient has history of breast cancer status postlumpectomy, nonischemic cardiomyopathy, greater than 20 pack years and pancreatic cancer undergoing chemotherapy.  Patient was admitted with worsening shortness of breath and worsening cough.  Significant leukocytosis on presentation.  Patient is currently on IV Rocephin  and oral doxycycline  for pneumonia.  02/27/2024: Patient seen.  Patient continues to report coughing.  There is associated runny nose.  Procalcitonin of 0.31.  Will start patient on Flonase  and loratadine .  02/28/19/2025: Patient seen.  Patient continues to have intermittent cough.  Decreased air entry is noted.  Will start patient on nebs DuoNeb, Pulmicort  and as needed albuterol .  Will also start patient on IV Solu-Medrol .  Continue Flonase , loratadine  and guaifenesin .  Likely discharge back home tomorrow if patient continues to improve.  Assessment & Plan:   Principal Problem:   Acute respiratory failure with hypoxia (HCC) Active Problems:   Essential hypertension   Hypothyroidism   Nonischemic cardiomyopathy (HCC)   Type 2 diabetes mellitus with other specified complication (HCC)   Pancreatic adenocarcinoma (HCC)   Normocytic anemia   Multifocal pneumonia   1. Multifocal pneumonia; acute hypoxic respiratory failure  - Presents with productive cough and SOB and found to be hypoxic with new leukocytosis, pro-BNP 1015, and edema vs multifocal PNA on CXR  - With new leukocytosis and sputum production, and absence of peripheral edema, JVD, or orthopnea, suspect this is pneumonia  - Check procalcitonin, continue antibiotics  and supplemental O2, follow cultures and clinical course   02/28/2024: Continue antibiotics.   2. NICM  - EF was 45-50% with global hypokinesis and grade 2 diastolic dysfunction on TTE form March 2024  - She was given 40 mg IV Lasix  at [facility], appears compensated  - Monitor weight and I/Os   02/28/2024: Currently compensated.   3. Hypertension  - Hold losartan  and treat as-needed only for now in light of increased creatinine and some marginal BP readings  02/27/2024: Blood pressure is controlled.   4. Pancreatic cancer  - Undergoing treatment with gemcitabine  under the care of Dr. Lanny and being considered for surgery at [facility]    5. Type II DM  - A1c was 6.1% this month  - Check CBGs and use low-intensity SSI only for now  02/27/2024: Continue to monitor and optimize.   6. Anemia  - Appears stable, no overt bleeding      DVT prophylaxis: Lovenox . Code Status: Full code. Family Communication:  Disposition Plan: Inpatient.   Consultants:  None.  Procedures:  None  Antimicrobials:  IV Rocephin . Oral doxycycline .   Subjective: No new complaints. Patient continues to cough.  Objective: Vitals:   02/27/24 1200 02/27/24 2121 02/27/24 2239 02/28/24 0443  BP: 112/62 123/64  127/65  Pulse: 68 63 74 73  Resp:  (!) 24 20 (!) 22  Temp: 98.3 F (36.8 C) 98.3 F (36.8 C)  99.6 F (37.6 C)  TempSrc: Oral Oral  Oral  SpO2: 95% (!) 88% 100% 92%  Weight:    65.3 kg  Height:        Intake/Output Summary (Last 24 hours) at 02/28/2024 1159 Last data filed at 02/28/2024 0900 Gross per 24 hour  Intake 238  ml  Output 500 ml  Net -262 ml   Filed Weights   02/26/24 2342 02/27/24 0449 02/28/24 0443  Weight: 62.2 kg 62.4 kg 65.3 kg    Examination:  General exam: Appears calm and comfortable. Respiratory system: Clear to auscultation.  Cardiovascular system: S1 & S2 heard Gastrointestinal system: Abdomen is soft and nontender.  Central nervous system: Alert and oriented. No  focal neurological deficits. Extremities: No leg edema  Data Reviewed: I have personally reviewed following labs and imaging studies  CBC: Recent Labs  Lab 02/26/24 1417 02/27/24 0428 02/28/24 0352  WBC 16.5* 15.1* 14.3*  NEUTROABS 12.6*  --  8.3*  HGB 9.9* 9.7* 8.7*  HCT 29.8* 31.1* 27.8*  MCV 92.8 96.6 96.2  PLT 702* 670* 657*   Basic Metabolic Panel: Recent Labs  Lab 02/26/24 1417 02/27/24 0428 02/28/24 0352  NA 135 139 136  K 4.1 3.9 3.8  CL 99 103 102  CO2 21* 22 23  GLUCOSE 132* 126* 89  BUN 21 19 12   CREATININE 1.24* 0.89 0.73  CALCIUM  10.1 9.6 9.3  MG  --  1.7  --    GFR: Estimated Creatinine Clearance: 54.4 mL/min (by C-G formula based on SCr of 0.73 mg/dL). Liver Function Tests: Recent Labs  Lab 02/26/24 1417  AST 42*  ALT 15  ALKPHOS 104  BILITOT 0.6  PROT 8.0  ALBUMIN 3.5   No results for input(s): LIPASE, AMYLASE in the last 168 hours. No results for input(s): AMMONIA in the last 168 hours. Coagulation Profile: No results for input(s): INR, PROTIME in the last 168 hours. Cardiac Enzymes: No results for input(s): CKTOTAL, CKMB, CKMBINDEX, TROPONINI in the last 168 hours. BNP (last 3 results) Recent Labs    02/26/24 1417  PROBNP 1,015.0*   HbA1C: No results for input(s): HGBA1C in the last 72 hours. CBG: Recent Labs  Lab 02/27/24 0753 02/27/24 1148 02/27/24 1640 02/27/24 2124 02/28/24 0752  GLUCAP 108* 165* 132* 166* 99   Lipid Profile: No results for input(s): CHOL, HDL, LDLCALC, TRIG, CHOLHDL, LDLDIRECT in the last 72 hours. Thyroid  Function Tests: No results for input(s): TSH, T4TOTAL, FREET4, T3FREE, THYROIDAB in the last 72 hours. Anemia Panel: No results for input(s): VITAMINB12, FOLATE, FERRITIN, TIBC, IRON, RETICCTPCT in the last 72 hours. Urine analysis:    Component Value Date/Time   COLORURINE YELLOW 06/26/2023 2155   APPEARANCEUR CLEAR 06/26/2023 2155    LABSPEC 1.023 06/26/2023 2155   PHURINE 5.0 06/26/2023 2155   GLUCOSEU >=500 (A) 06/26/2023 2155   HGBUR NEGATIVE 06/26/2023 2155   BILIRUBINUR NEGATIVE 06/26/2023 2155   KETONESUR NEGATIVE 06/26/2023 2155   PROTEINUR 30 (A) 06/26/2023 2155   NITRITE NEGATIVE 06/26/2023 2155   LEUKOCYTESUR NEGATIVE 06/26/2023 2155   Sepsis Labs: @LABRCNTIP (procalcitonin:4,lacticidven:4)  ) Recent Results (from the past 240 hours)  Resp panel by RT-PCR (RSV, Flu A&B, Covid) Anterior Nasal Swab     Status: None   Collection Time: 02/26/24  2:19 PM   Specimen: Anterior Nasal Swab  Result Value Ref Range Status   SARS Coronavirus 2 by RT PCR NEGATIVE NEGATIVE Final    Comment: (NOTE) SARS-CoV-2 target nucleic acids are NOT DETECTED.  The SARS-CoV-2 RNA is generally detectable in upper respiratory specimens during the acute phase of infection. The lowest concentration of SARS-CoV-2 viral copies this assay can detect is 138 copies/mL. A negative result does not preclude SARS-Cov-2 infection and should not be used as the sole basis for treatment or other patient management decisions. A  negative result may occur with  improper specimen collection/handling, submission of specimen other than nasopharyngeal swab, presence of viral mutation(s) within the areas targeted by this assay, and inadequate number of viral copies(<138 copies/mL). A negative result must be combined with clinical observations, patient history, and epidemiological information. The expected result is Negative.  Fact Sheet for Patients:  BloggerCourse.com  Fact Sheet for Healthcare Providers:  SeriousBroker.it  This test is no t yet approved or cleared by the [place]  FDA and  has been authorized for detection and/or diagnosis of SARS-CoV-2 by FDA under an Emergency Use Authorization (EUA). This EUA will remain  in effect (meaning this test can be used) for the duration of  the COVID-19 declaration under Section 564(b)(1) of the Act, 21 U.S.C.section 360bbb-3(b)(1), unless the authorization is terminated  or revoked sooner.       Influenza A by PCR NEGATIVE NEGATIVE Final   Influenza B by PCR NEGATIVE NEGATIVE Final    Comment: (NOTE) The Xpert Xpress SARS-CoV-2/FLU/RSV plus assay is intended as an aid in the diagnosis of influenza from Nasopharyngeal swab specimens and should not be used as a sole basis for treatment. Nasal washings and aspirates are unacceptable for Xpert Xpress SARS-CoV-2/FLU/RSV testing.  Fact Sheet for Patients: BloggerCourse.com  Fact Sheet for Healthcare Providers: SeriousBroker.it  This test is not yet approved or cleared by the [place]  FDA and has been authorized for detection and/or diagnosis of SARS-CoV-2 by FDA under an Emergency Use Authorization (EUA). This EUA will remain in effect (meaning this test can be used) for the duration of the COVID-19 declaration under Section 564(b)(1) of the Act, 21 U.S.C. section 360bbb-3(b)(1), unless the authorization is terminated or revoked.     Resp Syncytial Virus by PCR NEGATIVE NEGATIVE Final    Comment: (NOTE) Fact Sheet for Patients: BloggerCourse.com  Fact Sheet for Healthcare Providers: SeriousBroker.it  This test is not yet approved or cleared by the [place]  FDA and has been authorized for detection and/or diagnosis of SARS-CoV-2 by FDA under an Emergency Use Authorization (EUA). This EUA will remain in effect (meaning this test can be used) for the duration of the COVID-19 declaration under Section 564(b)(1) of the Act, 21 U.S.C. section 360bbb-3(b)(1), unless the authorization is terminated or revoked.  Performed at [facility], [address], [place], [place] [address]   Culture, blood (routine x 2)     Status: None (Preliminary  result)   Collection Time: 02/26/24  2:19 PM   Specimen: BLOOD  Result Value Ref Range Status   Specimen Description   Final    BLOOD LEFT ANTECUBITAL Performed at [facility], [address], [place], [place] [address]    Special Requests   Final    BOTTLES DRAWN AEROBIC AND ANAEROBIC Blood Culture adequate volume Performed at [facility], [address] [place], [place] [address]    Culture   Final    NO GROWTH 2 DAYS Performed at [facility], [address], [place], [place] [address]    Report Status PENDING  Incomplete  Culture, blood (routine x 2)     Status: None (Preliminary result)   Collection Time: 02/27/24 12:04 AM   Specimen: BLOOD  Result Value Ref Range Status   Specimen Description   Final    BLOOD SITE NOT SPECIFIED Performed at [facility], [address], [place], [place] [address]    Special Requests   Final    Blood Culture results may  not be optimal due to an inadequate volume of blood received in culture bottles BOTTLES DRAWN AEROBIC AND ANAEROBIC Performed at [facility], [address] [place], [place] [address]    Culture   Final    NO GROWTH 1 DAY Performed at [facility], [address] [place], [place] [address]    Report Status PENDING  Incomplete         Radiology Studies: DG Chest Port 1 View Result Date: 02/26/2024 CLINICAL DATA:  Shortness of breath EXAM: PORTABLE CHEST 1 VIEW COMPARISON:  Chest radiograph dated 06/26/2023 FINDINGS: Lines/tubes: Left chest wall port tip projects over the superior cavoatrial junction. Lungs: Well inflated lungs. Increased diffuse patchy and interstitial opacities in the right lung and left lung base Pleura: Trace blunting of bilateral costophrenic angles. No pneumothorax. Heart/mediastinum: Similar enlarged cardiomediastinal silhouette. Bones: No acute osseous abnormality. IMPRESSION: 1. Increased diffuse patchy  and interstitial opacities in the right lung and left lung base, which may represent pulmonary edema or multifocal pneumonia. 2. Trace blunting of bilateral costophrenic angles, which may represent trace pleural effusions. Electronically Signed   By: Limin  Xu M.D.   On: 02/26/2024 15:30        Scheduled Meds:  Chlorhexidine  Gluconate Cloth  6 each Topical Daily   doxycycline   100 mg Oral Q12H   enoxaparin  (LOVENOX ) injection  40 mg Subcutaneous Q24H   feeding supplement  237 mL Oral BID BM   fluticasone   2 spray Each Nare Daily   insulin  aspart  0-5 Units Subcutaneous QHS   insulin  aspart  0-6 Units Subcutaneous TID WC   loratadine   10 mg Oral Daily   NIFEdipine   60 mg Oral Daily   sodium chloride  flush  3 mL Intravenous Q12H   thyroid   90 mg Oral Q0600   Continuous Infusions:  cefTRIAXone  (ROCEPHIN )  IV 2 g (02/27/24 1631)     LOS: 2 days    Time spent: 35 minutes    Leatrice Chapel, MD  [facility] Pager #: [phone] 7PM-7AM contact night coverage as above

## 2024-02-29 DIAGNOSIS — J9601 Acute respiratory failure with hypoxia: Secondary | ICD-10-CM | POA: Diagnosis not present

## 2024-02-29 LAB — CBC
HCT: 29.2 % — ABNORMAL LOW (ref 36.0–46.0)
Hemoglobin: 9.1 g/dL — ABNORMAL LOW (ref 12.0–15.0)
MCH: 30.6 pg (ref 26.0–34.0)
MCHC: 31.2 g/dL (ref 30.0–36.0)
MCV: 98.3 fL (ref 80.0–100.0)
Platelets: 691 K/uL — ABNORMAL HIGH (ref 150–400)
RBC: 2.97 MIL/uL — ABNORMAL LOW (ref 3.87–5.11)
RDW: 17.2 % — ABNORMAL HIGH (ref 11.5–15.5)
WBC: 14.4 K/uL — ABNORMAL HIGH (ref 4.0–10.5)
nRBC: 0 % (ref 0.0–0.2)

## 2024-02-29 LAB — BASIC METABOLIC PANEL WITH GFR
Anion gap: 14 (ref 5–15)
BUN: 15 mg/dL (ref 8–23)
CO2: 21 mmol/L — ABNORMAL LOW (ref 22–32)
Calcium: 9.9 mg/dL (ref 8.9–10.3)
Chloride: 100 mmol/L (ref 98–111)
Creatinine, Ser: 0.73 mg/dL (ref 0.44–1.00)
GFR, Estimated: >60 mL/min (ref >60)
Glucose, Bld: 128 mg/dL — ABNORMAL HIGH (ref 70–99)
Potassium: 4.5 mmol/L (ref 3.5–5.1)
Sodium: 135 mmol/L (ref 135–145)

## 2024-02-29 LAB — GLUCOSE, CAPILLARY
Glucose-Capillary: 120 mg/dL — ABNORMAL HIGH (ref 70–99)
Glucose-Capillary: 147 mg/dL — ABNORMAL HIGH (ref 70–99)
Glucose-Capillary: 250 mg/dL — ABNORMAL HIGH (ref 70–99)

## 2024-02-29 MED ORDER — TRELEGY ELLIPTA 100-62.5-25 MCG/ACT IN AEPB: 1.0000 | INHALATION_SPRAY | Freq: Every day | RESPIRATORY_TRACT | 1 refills | Status: DC

## 2024-02-29 MED ORDER — NIFEDIPINE ER 30 MG PO TB24: 30.0000 mg | ORAL_TABLET | Freq: Every day | ORAL | 1 refills | Status: DC

## 2024-02-29 MED ORDER — PREDNISONE 10 MG PO TABS: ORAL_TABLET | ORAL | 0 refills | Status: DC

## 2024-02-29 MED ORDER — FUROSEMIDE 20 MG PO TABS: 20.0000 mg | ORAL_TABLET | Freq: Every day | ORAL | 0 refills | Status: DC

## 2024-02-29 MED ORDER — ENSURE PLUS HIGH PROTEIN PO LIQD: 237.0000 mL | Freq: Two times a day (BID) | ORAL | 1 refills | Status: AC

## 2024-02-29 MED ORDER — CEFDINIR 300 MG PO CAPS: 300.0000 mg | ORAL_CAPSULE | Freq: Two times a day (BID) | ORAL | 0 refills | Status: AC

## 2024-02-29 MED ORDER — HEPARIN SOD (PORK) LOCK FLUSH 100 UNIT/ML IV SOLN
500.0000 [IU] | Freq: Once | INTRAVENOUS | Status: AC
  Administered 2024-02-29: 500 [IU] via INTRAVENOUS
  Filled 2024-02-29: qty 5

## 2024-02-29 MED ORDER — GUAIFENESIN 100 MG/5ML PO LIQD: 5.0000 mL | ORAL | 0 refills | Status: AC | PRN

## 2024-02-29 MED ORDER — LIDOCAINE-PRILOCAINE 2.5-2.5 % EX CREA: 1.0000 | TOPICAL_CREAM | CUTANEOUS | Status: AC | PRN

## 2024-02-29 MED ORDER — ALBUTEROL SULFATE HFA 108 (90 BASE) MCG/ACT IN AERS: 2.0000 | INHALATION_SPRAY | Freq: Four times a day (QID) | RESPIRATORY_TRACT | 2 refills | Status: AC | PRN

## 2024-02-29 MED ORDER — CETIRIZINE HCL 10 MG PO TABS: 10.0000 mg | ORAL_TABLET | Freq: Every day | ORAL | 1 refills | Status: AC

## 2024-02-29 MED ORDER — SENNOSIDES-DOCUSATE SODIUM 8.6-50 MG PO TABS: 1.0000 | ORAL_TABLET | Freq: Every evening | ORAL | 0 refills | Status: AC | PRN

## 2024-02-29 NOTE — Evaluation (Addendum)
 Physical Therapy Evaluation Patient Details Name: Adrienne [name] MRN: [id number] DOB: 07-25-1945 Today's Date: 02/29/2024   SATURATION QUALIFICATIONS: (This note is used to comply with regulatory documentation for home oxygen)  Patient Saturations on Room Air at Rest = 91%  Patient Saturations on Room Air while Ambulating = 81%  Patient Saturations on 3 Liters of oxygen while Ambulating = 85%    History of Present Illness  78 yo female admitted with acute respiratory failure 2* Pna. Hx of R THA 2019, OA, glaucoma, SBO, CHF, L THA 2019, SBO, back sg  Clinical Impression  On eval, pt was Sup level for mobility. She walked ~60 feet x 2. Coughing spells intermittently. O2 97% on 2.5 L end of session. Will continue to follow pt during this hospital stay. Recommend HHPT if pt is agreeable.       If plan is discharge home, recommend the following: A little help with walking and/or transfers;A little help with bathing/dressing/bathroom   Can travel by private vehicle        Equipment Recommendations None recommended by PT  Recommendations for Other Services       Functional Status Assessment Patient has had a recent decline in their functional status and demonstrates the ability to make significant improvements in function in a reasonable and predictable amount of time.     Precautions / Restrictions Precautions Precautions: Fall Precaution/Restrictions Comments: monitor O2 Restrictions Weight Bearing Restrictions Per Provider Order: No      Mobility  Bed Mobility               General bed mobility comments: oob in recliner    Transfers Overall transfer level: Modified independent Equipment used: None                    Ambulation/Gait Ambulation/Gait assistance: Supervision Gait Distance (Feet): 60 Feet (x2) Assistive device: None Gait Pattern/deviations: Step-through pattern, Decreased stride length       General Gait Details: Slow,  cautious gait pattern 2* pt reporting she didn't feel steady 2* neuropathy. No LOB. O2: 81% RA, 85% 3L with ambulation. Coughing spells intermittently. Seated reat break between walks.  Stairs            Wheelchair Mobility     Tilt Bed    Modified Rankin (Stroke Patients Only)       Balance Overall balance assessment: Mild deficits observed, not formally tested, History of Falls                                           Pertinent Vitals/Pain Pain Assessment Pain Assessment: No/denies pain    Home Living Family/patient expects to be discharged to:: Private residence Living Arrangements: Children;Other relatives Available Help at Discharge: Family Type of Home: House Home Access: Stairs to enter Entrance Stairs-Rails: None Entrance Stairs-Number of Steps: 2   Home Layout: One level Home Equipment: Cane - single point      Prior Function Prior Level of Function : Independent/Modified Independent             Mobility Comments: uses cane PRN       Extremity/Trunk Assessment   Upper Extremity Assessment Upper Extremity Assessment: Overall WFL for tasks assessed    Lower Extremity Assessment Lower Extremity Assessment: Generalized weakness (hx of neuropathy)    Cervical / Trunk Assessment Cervical / Trunk Assessment: Normal  Communication  Communication Communication: No apparent difficulties    Cognition Arousal: Alert Behavior During Therapy: WFL for tasks assessed/performed   PT - Cognitive impairments: No apparent impairments                         Following commands: Intact       Cueing Cueing Techniques: Verbal cues     General Comments      Exercises     Assessment/Plan    PT Assessment Patient needs continued PT services  PT Problem List Decreased balance;Decreased mobility;Decreased activity tolerance       PT Treatment Interventions DME instruction;Gait training;Functional mobility  training;Therapeutic activities;Therapeutic exercise;Patient/family education;Balance training    PT Goals (Current goals can be found in the Care Plan section)  Acute Rehab PT Goals Patient Stated Goal: get rid of this cough PT Goal Formulation: With patient Time For Goal Achievement: 03/14/24 Potential to Achieve Goals: Good    Frequency Min 3X/week     Co-evaluation               AM-PAC PT 6 Clicks Mobility  Outcome Measure Help needed turning from your back to your side while in a flat bed without using bedrails?: None Help needed moving from lying on your back to sitting on the side of a flat bed without using bedrails?: None Help needed moving to and from a bed to a chair (including a wheelchair)?: None Help needed standing up from a chair using your arms (e.g., wheelchair or bedside chair)?: None Help needed to walk in hospital room?: A Little Help needed climbing 3-5 steps with a railing? : A Little 6 Click Score: 22    End of Session Equipment Utilized During Treatment: Gait belt Activity Tolerance: Patient tolerated treatment well Patient left: in chair;with call bell/phone within reach   PT Visit Diagnosis: History of falling (Z91.81);Unsteadiness on feet (R26.81)    Time: 9044-8981 PT Time Calculation (min) (ACUTE ONLY): 23 min   Charges:   PT Evaluation $PT Eval Low Complexity: 1 Low PT Treatments $Gait Training: 8-22 mins PT General Charges $$ ACUTE PT VISIT: 1 Visit            Dannial SQUIBB, PT Acute Rehabilitation  Office: [phone]

## 2024-02-29 NOTE — TOC Transition Note (Signed)
 Transition of Care [facility]) - Discharge Note   Patient Details  Name: Adrienne [name] MRN: [id number] Date of Birth: 14-Nov-1945  Transition of Care [facility] CM/SW Contact:  Heather DELENA Saltness, LCSW Phone Number: 02/29/2024, 4:09 PM   Clinical Narrative:    CSW met with pt at bedside to discuss discharge planning. Pt recommended for home oxygen and HH PT services. Home oxygen and travel tank ordered through [employer]. Jermaine with [employer] reports travel tank will be delivered to bedside. Pt denies HH agency preference. HH PT set up with [facility]. Pt reports daughter will transport her home upon discharge. No further TOC needs at this time.   Final next level of care: Home w Home Health Services Barriers to Discharge: Barriers Resolved   Patient Goals and CMS Choice Patient states their goals for this hospitalization and ongoing recovery are:: To return home        Discharge Placement  Home              Patient to be transferred to facility by: Daughter Name of family member notified: Patient Patient and family notified of of transfer: 02/29/24  Discharge Plan and Services Additional resources added to the After Visit Summary for  [facility]                DME Arranged: Oxygen DME Agency: [employer] Date DME Agency Contacted: 02/29/24 Time DME Agency Contacted: 1608 Representative spoke with at DME Agency: [place] HH Arranged: PT HH Agency: [facility] Date [facility] Agency Contacted: 02/29/24 Time HH Agency Contacted: 1608 Representative spoke with at [facility] Agency: [place]  Social Drivers of Health (SDOH) Interventions SDOH Screenings   Food Insecurity: No Food Insecurity (02/27/2024)  Housing: Low Risk  (02/27/2024)  Transportation Needs: No Transportation Needs (02/27/2024)  Utilities: Not At Risk (02/27/2024)  Alcohol Screen: Low Risk  (06/27/2021)  Depression (PHQ2-9): Low Risk  (02/24/2024)  Financial Resource Strain: Low Risk  (06/27/2021)  Physical  Activity: Inactive (06/27/2021)  Social Connections: Moderately Integrated (02/27/2024)  Stress: No Stress Concern Present (06/27/2021)  Tobacco Use: Medium Risk (02/27/2024)     Readmission Risk Interventions    02/29/2024    4:07 PM  Readmission Risk Prevention Plan  Transportation Screening Complete  Medication Review (RN Care Manager) Complete  PCP or Specialist appointment within 3-5 days of discharge Complete  HRI or Home Care Consult Complete  SW Recovery Care/Counseling Consult Complete  Palliative Care Screening Not Applicable  Skilled Nursing Facility Not Applicable     Signed: Heather Saltness, MSW, LCSW Clinical Social Worker Inpatient Care Management 02/29/2024 4:11 PM

## 2024-02-29 NOTE — Discharge Summary (Signed)
 Physician Discharge Summary  Patient ID: Adrienne [name] MRN: [id number] DOB/AGE: 01-15-1946 78 y.o.  Admit date: 02/26/2024 Discharge date: 02/29/2024  Admission Diagnoses:  Discharge Diagnoses:  Principal Problem:   Acute respiratory failure with hypoxia [facility]) Active Problems:   Essential hypertension   Hypothyroidism   Nonischemic cardiomyopathy (HCC)   Type 2 diabetes mellitus with other specified complication (HCC)   Pancreatic adenocarcinoma (HCC)   Normocytic anemia   Multifocal pneumonia   Discharged Condition: {condition:18240}  Hospital Course: ***  Consults: {consultation:18241}  Significant Diagnostic Studies: {diagnostics:18242}  Treatments: {Tx:18249}  Discharge Exam: Blood pressure 112/69, pulse 63, temperature 97.8 F (36.6 C), temperature source Oral, resp. rate 20, height 5' 3 (1.6 m), weight 64.2 kg, SpO2 98%. {physical zkjf:6958869}  Disposition: Discharge disposition: 06-Home-Health Care Svc       Discharge Instructions     Diet - low sodium heart healthy   Complete by: As directed    Increase activity slowly   Complete by: As directed       Allergies as of 02/29/2024       Reactions   Lisinopril  Swelling   Angioedema   Oxaliplatin  Shortness Of Breath, Swelling   Statins    Myalgias   Atorvastatin  Other (See Comments)   Muscle aches         Medication List     STOP taking these medications    azithromycin  250 MG tablet Commonly known as: ZITHROMAX    benzonatate  200 MG capsule Commonly known as: TESSALON    cyanocobalamin 1000 MCG tablet Commonly known as: VITAMIN B12   Vitamin D -3 125 MCG (5000 UT) Tabs       TAKE these medications    albuterol  108 (90 Base) MCG/ACT inhaler Commonly known as: VENTOLIN  HFA Inhale 2 puffs into the lungs every 6 (six) hours as needed for wheezing or shortness of breath.   BLOOD GLUCOSE TEST STRIPS Strp 1 each by In Vitro route in the morning, at noon, and at bedtime. May  substitute to any manufacturer covered by patient's insurance.   cefdinir  300 MG capsule Commonly known as: OMNICEF  Take 1 capsule (300 mg total) by mouth 2 (two) times daily for 5 days.   cetirizine  10 MG tablet Commonly known as: ZyrTEC  Allergy Take 1 tablet (10 mg total) by mouth daily.   feeding supplement Liqd Take 237 mLs by mouth 2 (two) times daily between meals. Start taking on: March 01, 2024   fluticasone  50 MCG/ACT nasal spray Commonly known as: FLONASE  Place 2 sprays into both nostrils daily.   furosemide  20 MG tablet Commonly known as: Lasix  Take 1 tablet (20 mg total) by mouth daily.   guaiFENesin  100 MG/5ML liquid Commonly known as: ROBITUSSIN Take 5 mLs by mouth every 4 (four) hours as needed for cough or to loosen phlegm.   Lantus  100 UNIT/ML injection Generic drug: insulin  glargine Inject 10 Units into the skin at bedtime.   lidocaine -prilocaine  cream Commonly known as: EMLA  Apply 1 Application topically as needed (port).   losartan  100 MG tablet Commonly known as: COZAAR  Take 1 tablet (100 mg total) by mouth daily.   NIFEdipine  30 MG 24 hr tablet Commonly known as: ADALAT  CC Take 1 tablet (30 mg total) by mouth daily. What changed:  medication strength how much to take   predniSONE  10 MG tablet Commonly known as: DELTASONE  Prednisone  40 mg p.o. once daily for 3 days, then 30 mg p.o. once daily for 3 days, then 20 mg p.o. once daily for 3  days, then 10 mg p.o. once daily for 3 days and stop.   senna-docusate 8.6-50 MG tablet Commonly known as: Senokot-S Take 1 tablet by mouth at bedtime as needed for mild constipation.   thyroid  90 MG tablet Commonly known as: NP Thyroid  Take 1 tablet (90 mg total) by mouth daily.   Trelegy Ellipta  100-62.5-25 MCG/ACT Aepb Generic drug: Fluticasone -Umeclidin-Vilant Inhale 1 Inhalation into the lungs daily in the afternoon.               Durable Medical Equipment  (From admission, onward)            Start     Ordered   02/29/24 1512  DME Oxygen  Once       Question Answer Comment  Length of Need 6 Months   Liters per Minute 3   Oxygen delivery system Gas      02/29/24 1512            Follow-up Information     [facility] Follow up.   Specialty: Home Health Services Why: This provider will reach out to you 24-48 hours after discharge to begin home health PT services. Contact information: [address] [place] [address] [phone]                 Signed: Leatrice LILLETTE Chapel 02/29/2024, 5:30 PM

## 2024-02-29 NOTE — Progress Notes (Signed)
 Patient had 4 beats asymptomatic VT, per tele. Patient's HR also 46-68 at this time. Rhythm BBB. Patient remains asymptomatic. VSS, BP 112/69. MD Ogbata aware- stated to continue with D/C.

## 2024-03-02 ENCOUNTER — Telehealth: Payer: Self-pay

## 2024-03-02 LAB — CULTURE, BLOOD (ROUTINE X 2)
Culture: NO GROWTH
Special Requests: ADEQUATE

## 2024-03-02 NOTE — Telephone Encounter (Signed)
 Copied from CRM [id number]. Topic: Clinical - Order For Equipment >> Mar 02, 2024 12:18 PM Berwyn MATSU wrote: Reason for CRM: patient called in requesting order for portal oxygen since she was released on 02/29/24 from the hospital with oxygen now.   Vendor she is using: [employer] [id number]  May you please assist.

## 2024-03-02 NOTE — TOC CM/SW Note (Signed)
 After d/c note-Received call from Secy w/patient on phone-spoke to patient about her concerns-she hasn't received 02 portables. After review of notes-Informed patient that she was d/c home w/travel 02 tank(as ordered) also has home 02 set up from [employer]-this is the obligation of the hospital. Additional styles of home 02(portables) will be managed by the DME company & her PCP after d/c. She can contact [employer] directly and I have called [employer] rep Jermaine to contact the d/c paitnet. Also informed her that She has been d/c from hospital & her contact going forward is her PCP. She verbalized understanding. No further asst needed.

## 2024-03-03 DIAGNOSIS — Z79899 Other long term (current) drug therapy: Secondary | ICD-10-CM | POA: Diagnosis not present

## 2024-03-03 DIAGNOSIS — Z853 Personal history of malignant neoplasm of breast: Secondary | ICD-10-CM | POA: Diagnosis not present

## 2024-03-03 DIAGNOSIS — Z7951 Long term (current) use of inhaled steroids: Secondary | ICD-10-CM | POA: Diagnosis not present

## 2024-03-03 DIAGNOSIS — Z96643 Presence of artificial hip joint, bilateral: Secondary | ICD-10-CM | POA: Diagnosis not present

## 2024-03-03 DIAGNOSIS — E785 Hyperlipidemia, unspecified: Secondary | ICD-10-CM | POA: Diagnosis not present

## 2024-03-03 DIAGNOSIS — I509 Heart failure, unspecified: Secondary | ICD-10-CM | POA: Diagnosis not present

## 2024-03-03 DIAGNOSIS — D630 Anemia in neoplastic disease: Secondary | ICD-10-CM | POA: Diagnosis not present

## 2024-03-03 DIAGNOSIS — M109 Gout, unspecified: Secondary | ICD-10-CM | POA: Diagnosis not present

## 2024-03-03 DIAGNOSIS — J189 Pneumonia, unspecified organism: Secondary | ICD-10-CM | POA: Diagnosis not present

## 2024-03-03 DIAGNOSIS — Z794 Long term (current) use of insulin: Secondary | ICD-10-CM | POA: Diagnosis not present

## 2024-03-03 DIAGNOSIS — I110 Hypertensive heart disease with heart failure: Secondary | ICD-10-CM | POA: Diagnosis not present

## 2024-03-03 DIAGNOSIS — C259 Malignant neoplasm of pancreas, unspecified: Secondary | ICD-10-CM | POA: Diagnosis not present

## 2024-03-03 DIAGNOSIS — I429 Cardiomyopathy, unspecified: Secondary | ICD-10-CM | POA: Diagnosis not present

## 2024-03-03 DIAGNOSIS — Z9981 Dependence on supplemental oxygen: Secondary | ICD-10-CM | POA: Diagnosis not present

## 2024-03-03 DIAGNOSIS — E119 Type 2 diabetes mellitus without complications: Secondary | ICD-10-CM | POA: Diagnosis not present

## 2024-03-03 DIAGNOSIS — Z9181 History of falling: Secondary | ICD-10-CM | POA: Diagnosis not present

## 2024-03-03 DIAGNOSIS — Z87891 Personal history of nicotine dependence: Secondary | ICD-10-CM | POA: Diagnosis not present

## 2024-03-03 DIAGNOSIS — Z7952 Long term (current) use of systemic steroids: Secondary | ICD-10-CM | POA: Diagnosis not present

## 2024-03-03 DIAGNOSIS — Z9011 Acquired absence of right breast and nipple: Secondary | ICD-10-CM | POA: Diagnosis not present

## 2024-03-03 DIAGNOSIS — G4730 Sleep apnea, unspecified: Secondary | ICD-10-CM | POA: Diagnosis not present

## 2024-03-03 DIAGNOSIS — J9601 Acute respiratory failure with hypoxia: Secondary | ICD-10-CM | POA: Diagnosis not present

## 2024-03-03 LAB — CULTURE, BLOOD (ROUTINE X 2): Culture: NO GROWTH

## 2024-03-03 NOTE — Telephone Encounter (Signed)
 Waiting on response from Dr Tobie.

## 2024-03-03 NOTE — Telephone Encounter (Signed)
 scheduled

## 2024-03-09 DIAGNOSIS — C50911 Malignant neoplasm of unspecified site of right female breast: Secondary | ICD-10-CM | POA: Diagnosis not present

## 2024-03-10 ENCOUNTER — Ambulatory Visit

## 2024-03-11 ENCOUNTER — Encounter: Payer: Self-pay | Admitting: Internal Medicine

## 2024-03-11 ENCOUNTER — Ambulatory Visit (INDEPENDENT_AMBULATORY_CARE_PROVIDER_SITE_OTHER): Admitting: Internal Medicine

## 2024-03-11 VITALS — BP 99/61 | HR 56 | Ht 63.0 in | Wt 140.4 lb

## 2024-03-11 DIAGNOSIS — J9601 Acute respiratory failure with hypoxia: Secondary | ICD-10-CM | POA: Diagnosis not present

## 2024-03-11 DIAGNOSIS — C259 Malignant neoplasm of pancreas, unspecified: Secondary | ICD-10-CM

## 2024-03-11 DIAGNOSIS — I10 Essential (primary) hypertension: Secondary | ICD-10-CM | POA: Diagnosis not present

## 2024-03-11 DIAGNOSIS — Z09 Encounter for follow-up examination after completed treatment for conditions other than malignant neoplasm: Secondary | ICD-10-CM | POA: Diagnosis not present

## 2024-03-11 DIAGNOSIS — I428 Other cardiomyopathies: Secondary | ICD-10-CM | POA: Diagnosis not present

## 2024-03-11 DIAGNOSIS — J189 Pneumonia, unspecified organism: Secondary | ICD-10-CM | POA: Diagnosis not present

## 2024-03-11 MED ORDER — LOSARTAN POTASSIUM 100 MG PO TABS: 50.0000 mg | ORAL_TABLET | Freq: Every day | ORAL | Status: DC

## 2024-03-11 NOTE — Patient Instructions (Addendum)
 Please start taking Losartan  50 mg once daily instead of 100 mg.  Please continue taking Nifedipine  30 mg once daily.  Please continue to take medications as prescribed.  Please continue to follow low salt diet and ambulate as tolerated.  Please get chest x-ray after 6 weeks at [facility].

## 2024-03-11 NOTE — Progress Notes (Unsigned)
 Established Patient Office Visit  Subjective:  Patient ID: Adrienne [name], female    DOB: 1945-12-15  Age: 78 y.o. MRN: [id number]  CC:  Chief Complaint  Patient presents with   Follow-up    Hospital f/u , reports feeling well.     HPI Adrienne [name] is a 78 y.o. female with past medical history of HTN, type 2 DM, nonishcemic CM, hypothyroidism, OA of hip and knee, HLD, breast ca. s/p right mastectomy who presents for follow-up after recent hospitalization.  Patient was admitted with worsening shortness of breath and worsening cough.  Significant leukocytosis was noted on presentation.  Patient was admitted and managed for multifocal pneumonia.  Patient was treated with IV Rocephin  and oral doxycycline .  Patient has improved significantly.  She was given home O2 for hypoxia, but has not required it recently.  Her O2 sat was 99% on room air today.  She does not report dyspnea upon exertion as well.    Past Medical History:  Diagnosis Date   Allergy    Ambulates with cane    straight cane   Arthritis    knee, back   CHF (congestive heart failure) (HCC)    Ductal carcinoma in situ of breast 12/2018   R Breast-mastectomy only   Gout    History of blood transfusion 1986   w/ Hysterectomy surgery   Hyperlipidemia    diet controlled - no meds   Hypertension    Hypothyroidism    Pelvic kidney    lower right pelvic kidney    SBO (small bowel obstruction) (HCC) 10/2016   surgery    Sleep apnea    Mild - no mask needed per sleep study   Smoker    quit smoking 2018   Type 2 diabetes mellitus (HCC)     Past Surgical History:  Procedure Laterality Date   ABDOMINAL HYSTERECTOMY  1986   COMPLETE-precancerous   APPENDECTOMY     pt states it was removed when gallbladder was removed.   AUGMENTATION MAMMAPLASTY     BACK SURGERY     lower back   BILIARY BRUSHING  07/08/2023   Procedure: BILIARY BRUSHING;  Surgeon: Wilhelmenia Aloha Raddle., MD;  Location: [facility];  Service: Gastroenterology;;   BILIARY STENT PLACEMENT N/A 07/08/2023   Procedure: BILIARY STENT PLACEMENT;  Surgeon: Wilhelmenia Aloha Raddle., MD;  Location: [facility];  Service: Gastroenterology;  Laterality: N/A;   BIOPSY  06/14/2023   Procedure: BIOPSY;  Surgeon: Avram Lupita BRAVO, MD;  Location: [facility];  Service: Gastroenterology;;   BIOPSY  07/08/2023   Procedure: BIOPSY;  Surgeon: Wilhelmenia Aloha Raddle., MD;  Location: [facility];  Service: Gastroenterology;;   BREAST BIOPSY Right 05/12/2019   times 2   BREAST LUMPECTOMY WITH RADIOACTIVE SEED LOCALIZATION Right 03/18/2019   Procedure: RIGHT BREAST LUMPECTOMY WITH RADIOACTIVE SEED LOCALIZATION;  Surgeon: [name], MD;  Location: [facility];  Service: General;  Laterality: Right;   CHOLECYSTECTOMY     COLONOSCOPY  02/15/2008   Kaplan    COLONOSCOPY WITH PROPOFOL   02/27/2018   Dr.Nandigam   ECTOPIC PREGNANCY SURGERY     ERCP N/A 06/14/2023   Procedure: ENDOSCOPIC RETROGRADE CHOLANGIOPANCREATOGRAPHY (ERCP);  Surgeon: Avram Lupita BRAVO, MD;  Location: [facility];  Service: Gastroenterology;  Laterality: N/A;   ERCP N/A 06/27/2023   Procedure: ENDOSCOPIC RETROGRADE CHOLANGIOPANCREATOGRAPHY (ERCP);  Surgeon: Charlanne Groom, MD;  Location: [facility];  Service: Gastroenterology;  Laterality: N/A;   ERCP N/A 07/08/2023   Procedure: ENDOSCOPIC  RETROGRADE CHOLANGIOPANCREATOGRAPHY (ERCP);  Surgeon: Wilhelmenia Aloha Raddle., MD;  Location: [facility];  Service: Gastroenterology;  Laterality: N/A;   ESOPHAGOGASTRODUODENOSCOPY N/A 07/08/2023   Procedure: ESOPHAGOGASTRODUODENOSCOPY (EGD);  Surgeon: Wilhelmenia Aloha Raddle., MD;  Location: [facility];  Service: Gastroenterology;  Laterality: N/A;   ESOPHAGOGASTRODUODENOSCOPY (EGD) WITH PROPOFOL  N/A 06/14/2023   Procedure: ESOPHAGOGASTRODUODENOSCOPY (EGD) WITH PROPOFOL ;  Surgeon: Avram Lupita BRAVO, MD;  Location: [facility];  Service: Gastroenterology;  Laterality: N/A;   EUS N/A  07/08/2023   Procedure: UPPER ENDOSCOPIC ULTRASOUND (EUS) LINEAR;  Surgeon: Wilhelmenia Aloha Raddle., MD;  Location: [facility];  Service: Gastroenterology;  Laterality: N/A;   FINE NEEDLE ASPIRATION N/A 07/08/2023   Procedure: FINE NEEDLE ASPIRATION (FNA) LINEAR;  Surgeon: Wilhelmenia Aloha Raddle., MD;  Location: [facility];  Service: Gastroenterology;  Laterality: N/A;   IR IMAGING GUIDED PORT INSERTION  07/29/2023   JOINT REPLACEMENT     Left hip total Dr. Melodi 10-01-17   MALONEY DILATION  06/14/2023   Procedure: AGAPITO DILATION;  Surgeon: Avram Lupita BRAVO, MD;  Location: [facility];  Service: Gastroenterology;;   MASTECTOMY Right 07/26/2019   PANCREATIC STENT PLACEMENT  06/14/2023   Procedure: PANCREATIC STENT PLACEMENT;  Surgeon: Avram Lupita BRAVO, MD;  Location: [facility];  Service: Gastroenterology;;   PANCREATIC STENT PLACEMENT  06/27/2023   Procedure: PANCREATIC STENT PLACEMENT;  Surgeon: Charlanne Groom, MD;  Location: [facility];  Service: Gastroenterology;;   POLYPECTOMY     polypectomy-oropharynx     RE-EXCISION OF BREAST CANCER,SUPERIOR MARGINS Right 04/08/2019   Procedure: RE-EXCISION OF RIGHT BREAST ANTERIOR MARGINS;  Surgeon: Curvin Mt III, MD;  Location: [facility];  Service: General;  Laterality: Right;   right knee meniscus     SBO  10/2016   small bowel obstruction   SIMPLE MASTECTOMY WITH AXILLARY SENTINEL NODE BIOPSY Right 07/26/2019   Procedure: RIGHT MASTECTOMY WITH SENTINEL NODE BIOPSY;  Surgeon: Curvin Mt MOULD, MD;  Location: [facility];  Service: General;  Laterality: Right;   SPHINCTEROTOMY  06/27/2023   Procedure: SPHINCTEROTOMY;  Surgeon: Charlanne Groom, MD;  Location: [facility];  Service: Gastroenterology;;   ANNETT  07/08/2023   Procedure: ANNETT;  Surgeon: Wilhelmenia Aloha Raddle., MD;  Location: [facility];  Service: Gastroenterology;;   TOTAL HIP ARTHROPLASTY Left 10/01/2017   Procedure: LEFT  TOTAL HIP ARTHROPLASTY ANTERIOR  APPROACH;  Surgeon: Melodi Lerner, MD;  Location: [facility];  Service: Orthopedics;  Laterality: Left;   TOTAL HIP ARTHROPLASTY Right 03/11/2018   Procedure: RIGHT TOTAL HIP ARTHROPLASTY ANTERIOR APPROACH;  Surgeon: Melodi Lerner, MD;  Location: [facility];  Service: Orthopedics;  Laterality: Right;    UPPER GASTROINTESTINAL ENDOSCOPY      Family History  Problem Relation Age of Onset   Cancer Sister    Diabetes Brother    Diabetes Brother    Hypertension Other    Breast cancer Sister    Colon cancer Neg Hx    Colon polyps Neg Hx    Rectal cancer Neg Hx    Stomach cancer Neg Hx     Social History   Socioeconomic History   Marital status: Widowed    Spouse name: Not on file   Number of children: 3   Years of education: 57   Highest education level: Some college, no degree  Occupational History   Occupation: retired  Tobacco Use   Smoking status: Former    Current packs/day: 0.00    Average packs/day: 1 pack/day for 32.0 years (32.0 ttl pk-yrs)  Types: E-cigarettes, Cigarettes    Start date: 11/20/1984    Quit date: 11/20/2016    Years since quitting: 7.3   Smokeless tobacco: Never   Tobacco comments:    Smoker since 1975 has quit and started back several times    As of 07/03/23: smoked 30 years, 1 PPD, quit 2018  Vaping Use   Vaping status: Former  Substance and Sexual Activity   Alcohol use: No   Drug use: No   Sexual activity: Not Currently    Birth control/protection: Surgical    Comment: Hysterectomy  Other Topics Concern   Not on file  Social History Narrative   Widow since 2006.Married previously for 25 years.Lives with grandson and 2 great grandchildren.Retired.Previously office work.   Social Drivers of Corporate investment banker Strain: Low Risk  (06/27/2021)   Overall Financial Resource Strain (CARDIA)    Difficulty of Paying Living Expenses: Not hard at all  Food Insecurity: No Food Insecurity (02/27/2024)   Hunger Vital Sign    Worried About  Running Out of Food in the Last Year: Never true    Ran Out of Food in the Last Year: Never true  Transportation Needs: No Transportation Needs (02/27/2024)   PRAPARE - Administrator, Civil Service (Medical): No    Lack of Transportation (Non-Medical): No  Physical Activity: Inactive (06/27/2021)   Exercise Vital Sign    Days of Exercise per Week: 0 days    Minutes of Exercise per Session: 0 min  Stress: No Stress Concern Present (06/27/2021)   [employer] of Occupational Health - Occupational Stress Questionnaire    Feeling of Stress : Not at all  Social Connections: Moderately Integrated (02/27/2024)   Social Connection and Isolation Panel    Frequency of Communication with Friends and Family: More than three times a week    Frequency of Social Gatherings with Friends and Family: Once a week    Attends Religious Services: More than 4 times per year    Active Member of [employer] or Organizations: Yes    Attends Banker Meetings: More than 4 times per year    Marital Status: Widowed  Intimate Partner Violence: Not At Risk (02/27/2024)   Humiliation, Afraid, Rape, and Kick questionnaire    Fear of Current or Ex-Partner: No    Emotionally Abused: No    Physically Abused: No    Sexually Abused: No    Outpatient Medications Prior to Visit  Medication Sig Dispense Refill   albuterol  (VENTOLIN  HFA) 108 (90 Base) MCG/ACT inhaler Inhale 2 puffs into the lungs every 6 (six) hours as needed for wheezing or shortness of breath. 8 g 2   cetirizine  (ZYRTEC  ALLERGY) 10 MG tablet Take 1 tablet (10 mg total) by mouth daily. 30 tablet 1   feeding supplement (ENSURE PLUS HIGH PROTEIN) LIQD Take 237 mLs by mouth 2 (two) times daily between meals. 14220 mL 1   fluticasone  (FLONASE ) 50 MCG/ACT nasal spray Place 2 sprays into both nostrils daily. 16 g 6   Fluticasone -Umeclidin-Vilant (TRELEGY ELLIPTA ) 100-62.5-25 MCG/ACT AEPB Inhale 1 Inhalation into the lungs daily in the afternoon. 1  each 1   furosemide  (LASIX ) 20 MG tablet Take 1 tablet (20 mg total) by mouth daily. 30 tablet 0   Glucose Blood (BLOOD GLUCOSE TEST STRIPS) STRP 1 each by In Vitro route in the morning, at noon, and at bedtime. May substitute to any manufacturer covered by patient's insurance. 100 strip 5   guaiFENesin  (  ROBITUSSIN) 100 MG/5ML liquid Take 5 mLs by mouth every 4 (four) hours as needed for cough or to loosen phlegm. 120 mL 0   insulin  glargine (LANTUS ) 100 UNIT/ML injection Inject 10 Units into the skin at bedtime.     lidocaine -prilocaine  (EMLA ) cream Apply 1 Application topically as needed (port).     NIFEdipine  (ADALAT  CC) 30 MG 24 hr tablet Take 1 tablet (30 mg total) by mouth daily. 30 tablet 1   predniSONE  (DELTASONE ) 10 MG tablet Prednisone  40 mg p.o. once daily for 3 days, then 30 mg p.o. once daily for 3 days, then 20 mg p.o. once daily for 3 days, then 10 mg p.o. once daily for 3 days and stop. 30 tablet 0   senna-docusate (SENOKOT-S) 8.6-50 MG tablet Take 1 tablet by mouth at bedtime as needed for mild constipation. 30 tablet 0   thyroid  (NP THYROID ) 90 MG tablet Take 1 tablet (90 mg total) by mouth daily. 30 tablet 5   losartan  (COZAAR ) 100 MG tablet Take 1 tablet (100 mg total) by mouth daily. 90 tablet 1   No facility-administered medications prior to visit.    Allergies  Allergen Reactions   Lisinopril  Swelling    Angioedema   Oxaliplatin  Shortness Of Breath and Swelling   Statins     Myalgias   Atorvastatin  Other (See Comments)    Muscle aches     ROS Review of Systems  Constitutional:  Negative for chills and fever.  HENT:  Negative for congestion and postnasal drip.   Eyes:  Positive for visual disturbance. Negative for pain and discharge.  Respiratory:  Negative for cough and shortness of breath.   Cardiovascular:  Negative for chest pain and palpitations.  Gastrointestinal:  Negative for abdominal pain, diarrhea, nausea and vomiting.  Endocrine: Negative for  polydipsia and polyuria.  Genitourinary:  Negative for dysuria and hematuria.  Musculoskeletal:  Positive for arthralgias. Negative for neck pain and neck stiffness.  Skin:  Negative for rash.  Neurological:  Negative for dizziness and weakness.  Psychiatric/Behavioral:  Negative for agitation and behavioral problems.       Objective:    Physical Exam Vitals reviewed.  Constitutional:      General: She is not in acute distress.    Appearance: She is obese. She is not diaphoretic.  HENT:     Head: Normocephalic and atraumatic.     Nose: No congestion.     Mouth/Throat:     Mouth: Mucous membranes are moist.     Pharynx: No posterior oropharyngeal erythema.  Eyes:     General: No scleral icterus.    Extraocular Movements: Extraocular movements intact.  Cardiovascular:     Rate and Rhythm: Normal rate and regular rhythm.     Heart sounds: Normal heart sounds. No murmur heard. Pulmonary:     Breath sounds: Normal breath sounds. No wheezing or rales.  Musculoskeletal:     Cervical back: Neck supple. No tenderness.     Right lower leg: No edema.     Left lower leg: No edema.  Skin:    General: Skin is warm.  Neurological:     General: No focal deficit present.     Mental Status: She is alert and oriented to person, place, and time.     Sensory: No sensory deficit.     Motor: No weakness.  Psychiatric:        Mood and Affect: Mood normal.        Behavior: Behavior normal.  BP 99/61   Pulse (!) 56   Ht 5' 3 (1.6 m)   Wt 140 lb 6.4 oz (63.7 kg)   SpO2 99%   BMI 24.87 kg/m  Wt Readings from Last 3 Encounters:  03/11/24 140 lb 6.4 oz (63.7 kg)  02/29/24 141 lb 8.6 oz (64.2 kg)  02/24/24 140 lb 6.4 oz (63.7 kg)    Lab Results  Component Value Date   TSH 9.380 (H) 02/24/2024   Lab Results  Component Value Date   WBC 14.4 (H) 02/29/2024   HGB 9.1 (L) 02/29/2024   HCT 29.2 (L) 02/29/2024   MCV 98.3 02/29/2024   PLT 691 (H) 02/29/2024   Lab Results   Component Value Date   NA 135 02/29/2024   K 4.5 02/29/2024   CO2 21 (L) 02/29/2024   GLUCOSE 128 (H) 02/29/2024   BUN 15 02/29/2024   CREATININE 0.73 02/29/2024   BILITOT 0.6 02/26/2024   ALKPHOS 104 02/26/2024   AST 42 (H) 02/26/2024   ALT 15 02/26/2024   PROT 8.0 02/26/2024   ALBUMIN 3.5 02/26/2024   CALCIUM  9.9 02/29/2024   ANIONGAP 14 02/29/2024   EGFR 42 (L) 06/24/2023   GFR 74.79 07/15/2013   Lab Results  Component Value Date   CHOL 219 (H) 10/10/2022   Lab Results  Component Value Date   HDL 53 10/10/2022   Lab Results  Component Value Date   LDLCALC 145 (H) 10/10/2022   Lab Results  Component Value Date   TRIG 118 10/10/2022   Lab Results  Component Value Date   CHOLHDL 4.1 10/10/2022   Lab Results  Component Value Date   HGBA1C 6.1 (H) 02/24/2024      Assessment & Plan:   Problem List Items Addressed This Visit       Cardiovascular and Mediastinum   Essential hypertension   BP Readings from Last 1 Encounters:  03/11/24 99/61   Tightly controlled with Losartan  100 mg QD and nifedipine  30 mg QD Dose of nifedipine  was recently reduced during hospitalization Advised to take losartan  50 mg QD for now Counseled for compliance with the medications Advised DASH diet and moderate exercise/walking, at least 150 mins/week      Relevant Medications   losartan  (COZAAR ) 100 MG tablet   Nonischemic cardiomyopathy (HCC)   On Losartan  Had to stop Coreg  due to bradycardia Appears euvolemic currently Followed by Cardiology      Relevant Medications   losartan  (COZAAR ) 100 MG tablet     Respiratory   Acute respiratory failure with hypoxia (HCC)   Likely due to multifocal pneumonia and/or pulmonary edema Was treated with IV antibiotics Was given Lasix  during hospitalization for pulmonary edema, dyspnea improved now Was given home O2 upon discharge, but has not required it - advised to contact DME to return the oxygen supplies      Multifocal  pneumonia - Primary   Was given IV Rocephin  and oral doxycycline  during recent hospitalization Was given oral prednisone  upon discharge, which she is going to complete tomorrow Has felt improvement in dyspnea Check CXR after 6 weeks      Relevant Orders   DG Chest 2 View     Digestive   Pancreatic adenocarcinoma (HCC)   Followed by GI and oncology - undergoing chemotherapy        Other   Hospital discharge follow-up   Hospital chart reviewed, including discharge summary Medications reconciled and reviewed with the patient in detail  Meds ordered this encounter  Medications   losartan  (COZAAR ) 100 MG tablet    Sig: Take 0.5 tablets (50 mg total) by mouth daily.    Follow-up: Return if symptoms worsen or fail to improve.    Suzzane MARLA Blanch, MD

## 2024-03-12 NOTE — Assessment & Plan Note (Signed)
 On Losartan  Had to stop Coreg  due to bradycardia Appears euvolemic currently Followed by Cardiology

## 2024-03-12 NOTE — Assessment & Plan Note (Signed)
 Was given IV Rocephin  and oral doxycycline  during recent hospitalization Was given oral prednisone  upon discharge, which she is going to complete tomorrow Has felt improvement in dyspnea Check CXR after 6 weeks

## 2024-03-12 NOTE — Assessment & Plan Note (Signed)
 BP Readings from Last 1 Encounters:  03/11/24 99/61   Tightly controlled with Losartan  100 mg QD and nifedipine  30 mg QD Dose of nifedipine  was recently reduced during hospitalization Advised to take losartan  50 mg QD for now Counseled for compliance with the medications Advised DASH diet and moderate exercise/walking, at least 150 mins/week

## 2024-03-12 NOTE — Assessment & Plan Note (Signed)
 Hospital chart reviewed, including discharge summary Medications reconciled and reviewed with the patient in detail

## 2024-03-12 NOTE — Assessment & Plan Note (Signed)
 Likely due to multifocal pneumonia and/or pulmonary edema Was treated with IV antibiotics Was given Lasix  during hospitalization for pulmonary edema, dyspnea improved now Was given home O2 upon discharge, but has not required it - advised to contact DME to return the oxygen supplies

## 2024-03-12 NOTE — Assessment & Plan Note (Signed)
 Followed by GI and oncology - undergoing chemotherapy

## 2024-03-15 ENCOUNTER — Other Ambulatory Visit: Payer: Self-pay | Admitting: Internal Medicine

## 2024-03-16 NOTE — Assessment & Plan Note (Signed)
 cT2N0M0, stage IB -Patient presented with obstructive jaundice, CT and MRI showed a 2.5 cm mass in the head of the pancreas, with pancreatic duct and biliary duct dilatation.  ERCP was attempted twice but failed due to deformed duodenum -She underwent ERCP and stent placement, EUS with fine-needle biopsy of the pancreatic mass by Dr. Wilhelmenia on July 08, 2023.  Biopsy confirmed adenocarcinoma. -Due to the tumor invasion to portal vein and SMV, I recommended neoadjuvant chemotherapy FOLFIRINOX, she started on 08/05/2023 -restaging CT 11/05/2023 showed stable disease  - Patient was seen by pancreatobiliary surgeon Dr. Dasie, who also consulted her colleagues at [facility], and they recommend a total of 6 months neoadjuvant chemotherapy.  Will change her chemo to second line gemcitabine  and Abraxane  for additional 3 months. She started on 12/02/2023

## 2024-03-17 ENCOUNTER — Inpatient Hospital Stay (HOSPITAL_BASED_OUTPATIENT_CLINIC_OR_DEPARTMENT_OTHER): Admitting: Hematology

## 2024-03-17 ENCOUNTER — Telehealth: Payer: Self-pay | Admitting: *Deleted

## 2024-03-17 ENCOUNTER — Inpatient Hospital Stay: Attending: Nurse Practitioner

## 2024-03-17 VITALS — BP 140/60 | HR 50 | Temp 97.9°F | Resp 15 | Ht 63.0 in | Wt 144.8 lb

## 2024-03-17 DIAGNOSIS — C259 Malignant neoplasm of pancreas, unspecified: Secondary | ICD-10-CM

## 2024-03-17 DIAGNOSIS — T451X5A Adverse effect of antineoplastic and immunosuppressive drugs, initial encounter: Secondary | ICD-10-CM | POA: Insufficient documentation

## 2024-03-17 DIAGNOSIS — Z9011 Acquired absence of right breast and nipple: Secondary | ICD-10-CM | POA: Diagnosis not present

## 2024-03-17 DIAGNOSIS — Z86000 Personal history of in-situ neoplasm of breast: Secondary | ICD-10-CM | POA: Diagnosis not present

## 2024-03-17 DIAGNOSIS — G620 Drug-induced polyneuropathy: Secondary | ICD-10-CM | POA: Diagnosis not present

## 2024-03-17 DIAGNOSIS — D0511 Intraductal carcinoma in situ of right breast: Secondary | ICD-10-CM

## 2024-03-17 DIAGNOSIS — D6481 Anemia due to antineoplastic chemotherapy: Secondary | ICD-10-CM

## 2024-03-17 DIAGNOSIS — C250 Malignant neoplasm of head of pancreas: Secondary | ICD-10-CM | POA: Diagnosis not present

## 2024-03-17 DIAGNOSIS — K8689 Other specified diseases of pancreas: Secondary | ICD-10-CM

## 2024-03-17 LAB — CBC WITH DIFFERENTIAL/PLATELET
Abs Immature Granulocytes: 0.03 K/uL (ref 0.00–0.07)
Basophils Absolute: 0 K/uL (ref 0.0–0.1)
Basophils Relative: 0 %
Eosinophils Absolute: 0.2 K/uL (ref 0.0–0.5)
Eosinophils Relative: 1 %
HCT: 29.3 % — ABNORMAL LOW (ref 36.0–46.0)
Hemoglobin: 9.6 g/dL — ABNORMAL LOW (ref 12.0–15.0)
Immature Granulocytes: 0 %
Lymphocytes Relative: 19 %
Lymphs Abs: 2.1 K/uL (ref 0.7–4.0)
MCH: 31.3 pg (ref 26.0–34.0)
MCHC: 32.8 g/dL (ref 30.0–36.0)
MCV: 95.4 fL (ref 80.0–100.0)
Monocytes Absolute: 0.9 K/uL (ref 0.1–1.0)
Monocytes Relative: 8 %
Neutro Abs: 7.9 K/uL — ABNORMAL HIGH (ref 1.7–7.7)
Neutrophils Relative %: 72 %
Platelets: 176 K/uL (ref 150–400)
RBC: 3.07 MIL/uL — ABNORMAL LOW (ref 3.87–5.11)
RDW: 18.7 % — ABNORMAL HIGH (ref 11.5–15.5)
WBC: 11.1 K/uL — ABNORMAL HIGH (ref 4.0–10.5)
nRBC: 0 % (ref 0.0–0.2)

## 2024-03-17 LAB — COMPREHENSIVE METABOLIC PANEL WITH GFR
ALT: 15 U/L (ref 0–44)
AST: 19 U/L (ref 15–41)
Albumin: 3.3 g/dL — ABNORMAL LOW (ref 3.5–5.0)
Alkaline Phosphatase: 86 U/L (ref 38–126)
Anion gap: 7 (ref 5–15)
BUN: 17 mg/dL (ref 8–23)
CO2: 25 mmol/L (ref 22–32)
Calcium: 8.6 mg/dL — ABNORMAL LOW (ref 8.9–10.3)
Chloride: 110 mmol/L (ref 98–111)
Creatinine, Ser: 1.15 mg/dL — ABNORMAL HIGH (ref 0.44–1.00)
GFR, Estimated: 49 mL/min — ABNORMAL LOW (ref >60)
Glucose, Bld: 168 mg/dL — ABNORMAL HIGH (ref 70–99)
Potassium: 3.9 mmol/L (ref 3.5–5.1)
Sodium: 142 mmol/L (ref 135–145)
Total Bilirubin: 0.4 mg/dL (ref 0.0–1.2)
Total Protein: 6.6 g/dL (ref 6.5–8.1)

## 2024-03-17 LAB — SAMPLE TO BLOOD BANK

## 2024-03-17 NOTE — Progress Notes (Signed)
 [facility]   Telephone:([phone] Fax:([phone]   Clinic Follow up Note   Patient Care Team: [name], MD as PCP - General (Internal Medicine) [name], MD as PCP - Cardiology (Cardiology) [name], RN as Oncology Nurse Navigator [name], MD as Consulting Physician (General Surgery) [name], MD as Consulting Physician (Hematology) [name], MD as Attending Physician (Radiation Oncology) Nicholaus Sherlean CROME, [facility] (Inactive) as Pharmacist (Pharmacist)  Date of Service:  03/17/2024  CHIEF COMPLAINT: f/u of pancreatic cancer  CURRENT THERAPY:  Observation  Oncology History   Pancreatic adenocarcinoma (HCC) cT2N0M0, stage IB -Patient presented with obstructive jaundice, CT and MRI showed a 2.5 cm mass in the head of the pancreas, with pancreatic duct and biliary duct dilatation.  ERCP was attempted twice but failed due to deformed duodenum -She underwent ERCP and stent placement, EUS with fine-needle biopsy of the pancreatic mass by Dr. Wilhelmenia on July 08, 2023.  Biopsy confirmed adenocarcinoma. -Due to the tumor invasion to portal vein and SMV, I recommended neoadjuvant chemotherapy FOLFIRINOX, she started on 08/05/2023 -restaging CT 11/05/2023 showed stable disease  - Patient was seen by pancreatobiliary surgeon Dr. Dasie, who also consulted her colleagues at [facility], and they recommend a total of 6 months neoadjuvant chemotherapy.  Will change her chemo to second line gemcitabine  and Abraxane  for additional 3 months. She started on 12/02/2023  Assessment & Plan Pancreatic cancer Pancreatic cancer remains stable with slight tumor size reduction from 2.9 x 1.7 cm to 2.8 x 2.2 cm. Tumor marker was never elevated. Vascular involvement complicates surgical resection. - Send CT scan to [facility] surgeons for evaluation of surgical options. - Await feedback from [facility] regarding surgical feasibility. - Consider additional chemotherapy or  radiation if surgery is not an option. - Call by Friday with updates after review of scan.  Chemotherapy-induced peripheral neuropathy Experiencing tingling and instability in movement due to chemotherapy-induced peripheral neuropathy. Declines medication and expresses concern about impact on daily activities. Previous physical therapy was not beneficial, but family encourages reconsideration for strengthening and fall prevention. - Discuss potential for different chemotherapy regimen if further treatment is needed. - Offer referral to outpatient physical therapy for neuropathy management.  Plan - I personally reviewed her restaging CT scan and discussed the findings with patient and her daughters. - I will contact pancreatobiliary surgeons at the [facility] and refer her if they can offer surgical resection. - I will call her daughter in a few days after her scan being reviewed at [facility].   SUMMARY OF ONCOLOGIC HISTORY: Oncology History  Ductal carcinoma in situ (DCIS) of right breast  01/28/2019 Mammogram   Diagnostic Mammogram 01/28/19  IMPRESSION: Right breast retroareolar 7 mm grouped pleomorphic calcifications are suspicious.   02/02/2019 Initial Biopsy   Diagnosis 02/02/19 Breast, right, needle core biopsy, outer retroareolar - DUCTAL CARCINOMA IN SITU WITH CALCIFICATIONS. Microscopic Comment The ductal carcinoma in situ is intermediate grade. Estrogen and progesterone receptors will be performed.  Results: IMMUNOHISTOCHEMICAL AND MORPHOMETRIC ANALYSIS PERFORMED MANUALLY Estrogen Receptor: 90%, POSITIVE, STRONG STAINING INTENSITY Progesterone Receptor: 20%, POSITIVE, STRONG STAINING INTENSITY    02/02/2019 Cancer Staging   Staging form: Breast, AJCC 8th Edition - Clinical stage from 02/02/2019: Stage 0 (cTis (DCIS), cN0, cM0, G2, ER+, PR+, HER2: Not Assessed) - Signed by [name], MD on 02/10/2019   02/04/2019 Initial Diagnosis   Ductal carcinoma in situ (DCIS) of right breast    03/18/2019 Surgery   RIGHT BREAST LUMPECTOMY WITH RADIOACTIVE  SEED LOCALIZATION by Dr. Curvin 03/18/19   03/18/2019 Pathology Results   FINAL MICROSCOPIC DIAGNOSIS: 03/18/19 A. BREAST, RIGHT, LUMPECTOMY:  - Ductal carcinoma in situ with calcifications and necrosis, 1.4 cm.  - Ductal carcinoma in situ focally 0.1 cm from medial and anterior  lumpectomy margins.  - Ductal carcinoma in situ involves the final anterior margin (See part  D).  - Ductal carcinoma in situ focally 0.3 cm from superior lumpectomy  margin.  - Biopsy site and biopsy clip.   B. BREAST, RIGHT LATERAL MARGIN, EXCISION:  - Benign breast tissue.  - No ductal carcinoma in situ.  - Final lateral margin greater than 1 cm.   C. BREAST, RIGHT MEDIAL MARGIN, EXCISION:  - Ductal carcinoma in situ, 0.3 cm.  - Ductal carcinoma in situ with focally 0.3 cm from final medial margin.   D. BREAST, RIGHT ANTERIOR MARGIN, EXCISION:  - Ductal carcinoma in situ, 1.5 cm.  - Ductal carcinoma in situ focally involves final anterior margin.   E. BREAST, RIGHT DEEP MARGIN, EXCISION:  - Fibrocystic changes.  - No ductal carcinoma in situ.  - Final posterior margin greater than 0.7 cm.    04/08/2019 Surgery   RE-EXCISION OF RIGHT BREAST ANTERIOR MARGINS by Dr Curvin 04/08/19   04/08/2019 Pathology Results    DIAGNOSIS: 04/08/19  A. BREAST, RIGHT, ANTERIOR SUPERIOR, EXCISION:  - Intermediate grade ductal carcinoma in situ with necrosis.  - In situ carcinoma is <1 mm (not on ink) from the new anterior superior  margin, multifocally.  - Intraductal papilloma.  - Fibrocystic change.   B. BREAST, RIGHT, ANTERIOR INFERIOR, EXCISION:  - Benign breast tissue with resection changes.   C. BREAST, RIGHT, MEDIAL, EXCISION:  - Benign breast tissue with resection changes.  - Fibroadenomatoid and fibrocystic changes.   05/06/2019 Mammogram   Right Mammogram 05/06/19 IMPRESSION: Suspicious finding with 2 groups of microcalcifications in  the medial right breast. The anterior group measures 2 x 2 x 2 mm. The more posterior group measures 4 x 3 x 4 mm.   05/12/2019 Pathology Results   Diagnosis 05/12/19 1. Breast, right, needle core biopsy, medial - DUCTAL CARCINOMA IN SITU WITH CALCIFICATIONS. - SEE MICROSCOPIC DESCRIPTION. 2. Breast, right, needle core biopsy, medial - DUCTAL CARCINOMA IN SITU WITH CALCIFICATIONS. - SEE MICROSCOPIC DESCRIPTION. Results: IMMUNOHISTOCHEMICAL AND MORPHOMETRIC ANALYSIS PERFORMED MANUALLY Estrogen Receptor: 100%, POSITIVE, STRONG STAINING INTENSITY Progesterone Receptor: 0%, NEGATIVE COMMENT: The negative hormone receptor study(ies) in this case has an internal positive control. Results: IMMUNOHISTOCHEMICAL AND MORPHOMETRIC ANALYSIS PERFORMED MANUALLY Estrogen Receptor: 100%, POSITIVE, STRONG STAINING INTENSITY Progesterone Receptor: 0%, NEGATIVE COMMENT: The negative hormone receptor study(ies) in this case has an internal positive control.   07/26/2019 Surgery   RIGHT MASTECTOMY WITH SENTINEL NODE BIOPSY by Dr. Curvin 07/26/19   07/26/2019 Pathology Results   FINAL MICROSCOPIC DIAGNOSIS: 07/26/19  A. LYMPH NODE, RIGHT #1, SENTINEL,  BIOPSY:  -  No carcinoma identified in one lymph node (0/1)   B. BREAST, RIGHT, MASTECTOMY:  -  Ductal carcinoma in situ, intermediate grade, 0.6 cm  -  Margins uninvolved by carcinoma (greater than 1 cm; posterior margin)  -  Previous biopsy site changes (x2)  -  See oncology table below   07/26/2019 Cancer Staging   Staging form: Breast, AJCC 8th Edition - Pathologic stage from 07/26/2019: Stage 0 (pTis (DCIS), pN0, cM0, G2, ER+, PR+, HER2: Not Assessed) - Signed by [name], MD on 08/10/2019   08/15/2023 Genetic Testing   Negative [facility] CancerNext-Expanded +  RNAinsight Panel.  VUS in ATM at c.4910-891G>A and SDHA at c.-1C>G.  Report date is 08/15/2023.   The CancerNext-Expanded gene panel offered by [facility] and includes sequencing, rearrangement, and  RNA analysis for the following 76 genes: AIP, ALK, APC, ATM, AXIN2, BAP1, BARD1, BMPR1A, BRCA1, BRCA2, BRIP1, CDC73, CDH1, CDK4, CDKN1B, CDKN2A, CEBPA, CHEK2, CTNNA1, DDX41, DICER1, ETV6, FH, FLCN, GATA2, LZTR1, MAX, MBD4, MEN1, MET, MLH1, MSH2, MSH3, MSH6, MUTYH, NF1, NF2, NTHL1, PALB2, PHOX2B, PMS2, POT1, PRKAR1A, PTCH1, PTEN, RAD51C, RAD51D, RB1, RET, RUNX1, SDHA, SDHAF2, SDHB, SDHC, SDHD, SMAD4, SMARCA4, SMARCB1, SMARCE1, STK11, SUFU, TMEM127, TP53, TSC1, TSC2, VHL, and WT1 (sequencing and deletion/duplication); EGFR, HOXB13, KIT, MITF, PDGFRA, POLD1, and POLE (sequencing only); EPCAM and GREM1 (deletion/duplication only).    Pancreatic adenocarcinoma (HCC)  07/07/2022 Cancer Staging   Staging form: Exocrine Pancreas, AJCC 8th Edition - Clinical stage from 07/07/2022: Stage IB (cT2, cN0, cM0) - Signed by [name], MD on 07/22/2023 Stage prefix: Initial diagnosis Total positive nodes: 0   06/13/2023 Initial Diagnosis   Pancreatic adenocarcinoma (HCC)   08/06/2023 - 11/13/2023 Chemotherapy   Patient is on Treatment Plan : PANCREAS Modified FOLFIRINOX q14d x 4 cycles     08/15/2023 Genetic Testing   Negative [facility] CancerNext-Expanded +RNAinsight Panel.  VUS in ATM at c.4910-891G>A and SDHA at c.-1C>G.  Report date is 08/15/2023.   The CancerNext-Expanded gene panel offered by [facility] and includes sequencing, rearrangement, and RNA analysis for the following 76 genes: AIP, ALK, APC, ATM, AXIN2, BAP1, BARD1, BMPR1A, BRCA1, BRCA2, BRIP1, CDC73, CDH1, CDK4, CDKN1B, CDKN2A, CEBPA, CHEK2, CTNNA1, DDX41, DICER1, ETV6, FH, FLCN, GATA2, LZTR1, MAX, MBD4, MEN1, MET, MLH1, MSH2, MSH3, MSH6, MUTYH, NF1, NF2, NTHL1, PALB2, PHOX2B, PMS2, POT1, PRKAR1A, PTCH1, PTEN, RAD51C, RAD51D, RB1, RET, RUNX1, SDHA, SDHAF2, SDHB, SDHC, SDHD, SMAD4, SMARCA4, SMARCB1, SMARCE1, STK11, SUFU, TMEM127, TP53, TSC1, TSC2, VHL, and WT1 (sequencing and deletion/duplication); EGFR, HOXB13, KIT, MITF, PDGFRA, POLD1, and POLE  (sequencing only); EPCAM and GREM1 (deletion/duplication only).    12/02/2023 -  Chemotherapy   Patient is on Treatment Plan : PANCREATIC Abraxane  D1,8 + Gemcitabine  D1,8 q21d        Discussed the use of AI scribe software for clinical note transcription with the patient, who gave verbal consent to proceed.  History of Present Illness Adrienne [name] is a 78 year old female with pancreatic cancer who presents for follow-up after chemotherapy cessation. She is accompanied by her daughter.  She has been off chemotherapy for three to four weeks and notes an overall improvement in her condition. She experiences new tingling sensations but does not require medication for this. She is not interested in resuming chemotherapy due to neuropathy, which affects her movement and causes her to drop things. She has difficulty walking and feels unstable, especially when wearing certain shoes.  A CT scan performed three weeks ago showed tumor measurements of 2.8 cm by 2.2 cm, compared to a previous measurement of 2.9 cm by 1.7 cm.  Her medications include losartan  and thyroid  medication. Her blood counts are stable, and she does not require a blood transfusion. She still has a port in place for potential future treatments. She experiences fatigue and a need to rest after recent hospitalizations. She has recovered from pneumonia after completing antibiotics.     All other systems were reviewed with the patient and are negative.  MEDICAL HISTORY:  Past Medical History:  Diagnosis Date   Allergy    Ambulates with cane    straight cane  Arthritis    knee, back   CHF (congestive heart failure) (HCC)    Ductal carcinoma in situ of breast 12/2018   R Breast-mastectomy only   Gout    History of blood transfusion 1986   w/ Hysterectomy surgery   Hyperlipidemia    diet controlled - no meds   Hypertension    Hypothyroidism    Pelvic kidney    lower right pelvic kidney    SBO (small bowel  obstruction) (HCC) 10/2016   surgery    Sleep apnea    Mild - no mask needed per sleep study   Smoker    quit smoking 2018   Type 2 diabetes mellitus (HCC)     SURGICAL HISTORY: Past Surgical History:  Procedure Laterality Date   ABDOMINAL HYSTERECTOMY  1986   COMPLETE-precancerous   APPENDECTOMY     pt states it was removed when gallbladder was removed.   AUGMENTATION MAMMAPLASTY     BACK SURGERY     lower back   BILIARY BRUSHING  07/08/2023   Procedure: BILIARY BRUSHING;  Surgeon: Wilhelmenia Aloha Raddle., MD;  Location: [facility];  Service: Gastroenterology;;   BILIARY STENT PLACEMENT N/A 07/08/2023   Procedure: BILIARY STENT PLACEMENT;  Surgeon: Wilhelmenia Aloha Raddle., MD;  Location: [facility];  Service: Gastroenterology;  Laterality: N/A;   BIOPSY  06/14/2023   Procedure: BIOPSY;  Surgeon: Avram Lupita BRAVO, MD;  Location: [facility];  Service: Gastroenterology;;   BIOPSY  07/08/2023   Procedure: BIOPSY;  Surgeon: Wilhelmenia Aloha Raddle., MD;  Location: [facility];  Service: Gastroenterology;;   BREAST BIOPSY Right 05/12/2019   times 2   BREAST LUMPECTOMY WITH RADIOACTIVE SEED LOCALIZATION Right 03/18/2019   Procedure: RIGHT BREAST LUMPECTOMY WITH RADIOACTIVE SEED LOCALIZATION;  Surgeon: [name], MD;  Location: [facility];  Service: General;  Laterality: Right;   CHOLECYSTECTOMY     COLONOSCOPY  02/15/2008   Kaplan    COLONOSCOPY WITH PROPOFOL   02/27/2018   Dr.Nandigam   ECTOPIC PREGNANCY SURGERY     ERCP N/A 06/14/2023   Procedure: ENDOSCOPIC RETROGRADE CHOLANGIOPANCREATOGRAPHY (ERCP);  Surgeon: Avram Lupita BRAVO, MD;  Location: [facility];  Service: Gastroenterology;  Laterality: N/A;   ERCP N/A 06/27/2023   Procedure: ENDOSCOPIC RETROGRADE CHOLANGIOPANCREATOGRAPHY (ERCP);  Surgeon: Charlanne Groom, MD;  Location: [facility];  Service: Gastroenterology;  Laterality: N/A;   ERCP N/A 07/08/2023   Procedure: ENDOSCOPIC RETROGRADE CHOLANGIOPANCREATOGRAPHY (ERCP);   Surgeon: Wilhelmenia Aloha Raddle., MD;  Location: [facility];  Service: Gastroenterology;  Laterality: N/A;   ESOPHAGOGASTRODUODENOSCOPY N/A 07/08/2023   Procedure: ESOPHAGOGASTRODUODENOSCOPY (EGD);  Surgeon: Wilhelmenia Aloha Raddle., MD;  Location: [facility];  Service: Gastroenterology;  Laterality: N/A;   ESOPHAGOGASTRODUODENOSCOPY (EGD) WITH PROPOFOL  N/A 06/14/2023   Procedure: ESOPHAGOGASTRODUODENOSCOPY (EGD) WITH PROPOFOL ;  Surgeon: Avram Lupita BRAVO, MD;  Location: [facility];  Service: Gastroenterology;  Laterality: N/A;   EUS N/A 07/08/2023   Procedure: UPPER ENDOSCOPIC ULTRASOUND (EUS) LINEAR;  Surgeon: Wilhelmenia Aloha Raddle., MD;  Location: [facility];  Service: Gastroenterology;  Laterality: N/A;   FINE NEEDLE ASPIRATION N/A 07/08/2023   Procedure: FINE NEEDLE ASPIRATION (FNA) LINEAR;  Surgeon: Wilhelmenia Aloha Raddle., MD;  Location: [facility];  Service: Gastroenterology;  Laterality: N/A;   IR IMAGING GUIDED PORT INSERTION  07/29/2023   JOINT REPLACEMENT     Left hip total Dr. Melodi 10-01-17   MALONEY DILATION  06/14/2023   Procedure: AGAPITO DILATION;  Surgeon: Avram Lupita BRAVO, MD;  Location: [facility];  Service: Gastroenterology;;   MASTECTOMY Right 07/26/2019  PANCREATIC STENT PLACEMENT  06/14/2023   Procedure: PANCREATIC STENT PLACEMENT;  Surgeon: Avram Lupita BRAVO, MD;  Location: [facility];  Service: Gastroenterology;;   PANCREATIC STENT PLACEMENT  06/27/2023   Procedure: PANCREATIC STENT PLACEMENT;  Surgeon: Charlanne Groom, MD;  Location: [facility];  Service: Gastroenterology;;   POLYPECTOMY     polypectomy-oropharynx     RE-EXCISION OF BREAST CANCER,SUPERIOR MARGINS Right 04/08/2019   Procedure: RE-EXCISION OF RIGHT BREAST ANTERIOR MARGINS;  Surgeon: Curvin Mt III, MD;  Location: [facility];  Service: General;  Laterality: Right;   right knee meniscus     SBO  10/2016   small bowel obstruction   SIMPLE MASTECTOMY WITH AXILLARY SENTINEL NODE BIOPSY Right 07/26/2019    Procedure: RIGHT MASTECTOMY WITH SENTINEL NODE BIOPSY;  Surgeon: Curvin Mt MOULD, MD;  Location: [facility];  Service: General;  Laterality: Right;   SPHINCTEROTOMY  06/27/2023   Procedure: SPHINCTEROTOMY;  Surgeon: Charlanne Groom, MD;  Location: [facility];  Service: Gastroenterology;;   ANNETT  07/08/2023   Procedure: ANNETT;  Surgeon: Wilhelmenia Aloha Raddle., MD;  Location: [facility];  Service: Gastroenterology;;   TOTAL HIP ARTHROPLASTY Left 10/01/2017   Procedure: LEFT  TOTAL HIP ARTHROPLASTY ANTERIOR APPROACH;  Surgeon: Melodi Lerner, MD;  Location: [facility];  Service: Orthopedics;  Laterality: Left;   TOTAL HIP ARTHROPLASTY Right 03/11/2018   Procedure: RIGHT TOTAL HIP ARTHROPLASTY ANTERIOR APPROACH;  Surgeon: Melodi Lerner, MD;  Location: [facility];  Service: Orthopedics;  Laterality: Right;    UPPER GASTROINTESTINAL ENDOSCOPY      I have reviewed the social history and family history with the patient and they are unchanged from previous note.  ALLERGIES:  is allergic to lisinopril , oxaliplatin , statins, and atorvastatin .  MEDICATIONS:  Current Outpatient Medications  Medication Sig Dispense Refill   albuterol  (VENTOLIN  HFA) 108 (90 Base) MCG/ACT inhaler Inhale 2 puffs into the lungs every 6 (six) hours as needed for wheezing or shortness of breath. 8 g 2   cetirizine  (ZYRTEC  ALLERGY) 10 MG tablet Take 1 tablet (10 mg total) by mouth daily. 30 tablet 1   feeding supplement (ENSURE PLUS HIGH PROTEIN) LIQD Take 237 mLs by mouth 2 (two) times daily between meals. 14220 mL 1   fluticasone  (FLONASE ) 50 MCG/ACT nasal spray Place 2 sprays into both nostrils daily. 16 g 6   Fluticasone -Umeclidin-Vilant (TRELEGY ELLIPTA ) 100-62.5-25 MCG/ACT AEPB Inhale 1 Inhalation into the lungs daily in the afternoon. 1 each 1   furosemide  (LASIX ) 20 MG tablet Take 1 tablet (20 mg total) by mouth daily. 30 tablet 0   Glucose Blood (BLOOD GLUCOSE TEST STRIPS) STRP 1 each  by In Vitro route in the morning, at noon, and at bedtime. May substitute to any manufacturer covered by patient's insurance. 100 strip 5   guaiFENesin  (ROBITUSSIN) 100 MG/5ML liquid Take 5 mLs by mouth every 4 (four) hours as needed for cough or to loosen phlegm. 120 mL 0   insulin  glargine (LANTUS ) 100 UNIT/ML injection Inject 10 Units into the skin at bedtime.     lidocaine -prilocaine  (EMLA ) cream Apply 1 Application topically as needed (port).     losartan  (COZAAR ) 100 MG tablet Take 0.5 tablets (50 mg total) by mouth daily.     NIFEdipine  (ADALAT  CC) 30 MG 24 hr tablet Take 1 tablet (30 mg total) by mouth daily. 30 tablet 1   predniSONE  (DELTASONE ) 10 MG tablet Prednisone  40 mg p.o. once daily for 3 days, then 30 mg p.o. once daily for 3 days,  then 20 mg p.o. once daily for 3 days, then 10 mg p.o. once daily for 3 days and stop. 30 tablet 0   RELION PEN NEEDLES 31G X 6 MM MISC USE 1  THREE TIMES DAILY 100 each 0   senna-docusate (SENOKOT-S) 8.6-50 MG tablet Take 1 tablet by mouth at bedtime as needed for mild constipation. 30 tablet 0   thyroid  (NP THYROID ) 90 MG tablet Take 1 tablet (90 mg total) by mouth daily. 30 tablet 5   No current facility-administered medications for this visit.    PHYSICAL EXAMINATION: ECOG PERFORMANCE STATUS: 2 - Symptomatic, <50% confined to bed  Vitals:   03/17/24 1108 03/17/24 1110  BP: (!) 154/64 (!) 140/60  Pulse: (!) 50   Resp: 15   Temp: 97.9 F (36.6 C)   SpO2: 94%    Wt Readings from Last 3 Encounters:  03/17/24 144 lb 12.8 oz (65.7 kg)  03/11/24 140 lb 6.4 oz (63.7 kg)  02/29/24 141 lb 8.6 oz (64.2 kg)     GENERAL:alert, no distress and comfortable SKIN: skin color, texture, turgor are normal, no rashes or significant lesions EYES: normal, Conjunctiva are pink and non-injected, sclera clear Musculoskeletal:no cyanosis of digits and no clubbing    Physical Exam    LABORATORY DATA:  I have reviewed the data as listed    Latest Ref  Rng & Units 03/17/2024   10:51 AM 02/29/2024    2:36 AM 02/28/2024    3:52 AM  CBC  WBC 4.0 - 10.5 K/uL 11.1  14.4  14.3   Hemoglobin 12.0 - 15.0 g/dL 9.6  9.1  8.7   Hematocrit 36.0 - 46.0 % 29.3  29.2  27.8   Platelets 150 - 400 K/uL 176  691  657         Latest Ref Rng & Units 03/17/2024   10:51 AM 02/29/2024    2:36 AM 02/28/2024    3:52 AM  CMP  Glucose 70 - 99 mg/dL 831  871  89   BUN 8 - 23 mg/dL 17  15  12    Creatinine 0.44 - 1.00 mg/dL 8.84  9.26  9.26   Sodium 135 - 145 mmol/L 142  135  136   Potassium 3.5 - 5.1 mmol/L 3.9  4.5  3.8   Chloride 98 - 111 mmol/L 110  100  102   CO2 22 - 32 mmol/L 25  21  23    Calcium  8.9 - 10.3 mg/dL 8.6  9.9  9.3   Total Protein 6.5 - 8.1 g/dL 6.6     Total Bilirubin 0.0 - 1.2 mg/dL 0.4     Alkaline Phos 38 - 126 U/L 86     AST 15 - 41 U/L 19     ALT 0 - 44 U/L 15         RADIOGRAPHIC STUDIES: I have personally reviewed the radiological images as listed and agreed with the findings in the report. No results found.    No orders of the defined types were placed in this encounter.  All questions were answered. The patient knows to call the clinic with any problems, questions or concerns. No barriers to learning was detected. The total time spent in the appointment was 40 minutes, including review of chart and various tests results, discussions about plan of care and coordination of care plan     Onita Mattock, MD 03/17/2024

## 2024-03-17 NOTE — Telephone Encounter (Signed)
 Per Dr. Demetra instructions -contacted [facility] [phone]) to request images from 07/17/23, 01/29/24 and 11/05/23 sent to [facility]. [place] with [facility] confirmed images sent.

## 2024-03-24 ENCOUNTER — Other Ambulatory Visit: Payer: Self-pay

## 2024-03-24 ENCOUNTER — Telehealth: Payer: Self-pay | Admitting: Radiation Oncology

## 2024-03-24 ENCOUNTER — Telehealth: Payer: Self-pay

## 2024-03-24 NOTE — Telephone Encounter (Signed)
LVM to schedule CON with Dr. Lisbeth Renshaw

## 2024-03-24 NOTE — Progress Notes (Signed)
 The proposed treatment discussed in conference is for discussion purpose only and is not a binding recommendation.  The patients have not been physically examined, or presented with their treatment options.  Therefore, final treatment plans cannot be decided.

## 2024-03-24 NOTE — Telephone Encounter (Signed)
 Received telephone call from [name] Kipper, patient's daughter. Caller stated she received a telephone call from our facility advising upcoming radiation appt for the patient. Caller stated Dr. Lanny was supposed to contact her last Friday, 03/19/24 to touch base about patient's surgery but she has not yet been contacted so the patient and her family are unaware of the plan and are unsure the patient has been scheduled for radiation.  Caller requesting a callback today to further discuss patient's plan of care.

## 2024-03-24 NOTE — Telephone Encounter (Signed)
 Received c/b from pt's daughter Luke. She was informed of referral requested by Dr. Lanny for Dr. Dewey consultation. Kim advised she was not aware of referral for radiation. She was agreeable to schedule consult 10/8 @ 9:30am. Call transferred to Dr. Demetra nurse at the request of Luke, to have more questions answered.

## 2024-03-29 NOTE — Progress Notes (Signed)
 Radiation Oncology         [phone] ________________________________  Name: Adrienne [name]        MRN: [id number]  Date of Service: 03/31/2024 DOB: 1945-07-24  RR:Ejuzo, Suzzane POUR, MD  [name], MD     REFERRING PHYSICIAN: [name], MD   DIAGNOSIS: The encounter diagnosis was Malignant neoplasm of head of pancreas [facility]).   HISTORY OF PRESENT ILLNESS: Adrienne [name] is a 78 y.o. female seen at the request of Dr. Lanny for diagnosis of pancreatic cancer.  Of note the patient has a history of a right breast DCIS diagnosed in July 2020.  She was seen in breast oncology multidisciplinary clinic and met Dr. Izell at that time.  She ultimately decided to proceed with a right lumpectomy in September 2020 and had to go back for reexcision of her margins which ultimately led to a mastectomy and sentinel lymph node biopsy which was performed on 07/26/2023 showing 6 mm of intermediate grade DCIS with negative margins and a single lymph node that was negative for disease. This year however the patient presented with obstructive jaundice and was found to have a 2.5 cm mass in the head of the pancreas by CT and MRI scan.  An ERCP was attempted on 2 occasions but could not be completed due to the patient's anatomy of the duodenum.  An ERCP and stent placement however was accomplished on 07/08/2023 and cytology from fine-needle aspirate showed adenocarcinoma in the head of the pancreas.  At that time there was concern with involvement of the portal vein and SMV and she began neoadjuvant chemotherapy with FOLFIRINOX beginning on 08/05/2023.  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  Interval imaging on 11/05/23 showed stable disease, and she completed FOLFIRINOX 8 cycles on 11/11/2023.  She was encouraged to continue her neoadjuvant regimen for a total of 6 months after her case had also been presented to the team at [facility].  Her regimen was  switched to Gemzar  Abraxane  and she continued this between 12/02/2023 and 02/09/2024.  Restaging on 02/18/2024 showed persistent pancreatic head mass measuring 2.8 cm with upstream ductal dilatation of the pancreatic duct.  Her case was rediscussed with the surgical team at [facility] and she was not felt to be a candidate for surgical resection.  Rather she is seen to consider chemoradiation with oral Xeloda.    PREVIOUS RADIATION THERAPY: {EXAM; YES/NO:19492::No}   PAST MEDICAL HISTORY:  Past Medical History:  Diagnosis Date   Allergy    Ambulates with cane    straight cane   Arthritis    knee, back   CHF (congestive heart failure) (HCC)    Ductal carcinoma in situ of breast 12/2018   R Breast-mastectomy only   Gout    History of blood transfusion 1986   w/ Hysterectomy surgery   Hyperlipidemia    diet controlled - no meds   Hypertension    Hypothyroidism    Pelvic kidney    lower right pelvic kidney    SBO (small bowel obstruction) (HCC) 10/2016   surgery    Sleep apnea    Mild - no mask needed per sleep study   Smoker    quit smoking 2018   Type 2 diabetes mellitus (HCC)        PAST SURGICAL HISTORY: Past Surgical History:  Procedure Laterality Date   ABDOMINAL HYSTERECTOMY  1986   COMPLETE-precancerous   APPENDECTOMY  pt states it was removed when gallbladder was removed.   AUGMENTATION MAMMAPLASTY     BACK SURGERY     lower back   BILIARY BRUSHING  07/08/2023   Procedure: BILIARY BRUSHING;  Surgeon: Wilhelmenia Aloha Raddle., MD;  Location: [facility];  Service: Gastroenterology;;   BILIARY STENT PLACEMENT N/A 07/08/2023   Procedure: BILIARY STENT PLACEMENT;  Surgeon: Wilhelmenia Aloha Raddle., MD;  Location: [facility];  Service: Gastroenterology;  Laterality: N/A;   BIOPSY  06/14/2023   Procedure: BIOPSY;  Surgeon: Avram Lupita BRAVO, MD;  Location: [facility];  Service: Gastroenterology;;   BIOPSY  07/08/2023   Procedure: BIOPSY;  Surgeon: Wilhelmenia Aloha Raddle., MD;  Location: [facility];  Service: Gastroenterology;;   BREAST BIOPSY Right 05/12/2019   times 2   BREAST LUMPECTOMY WITH RADIOACTIVE SEED LOCALIZATION Right 03/18/2019   Procedure: RIGHT BREAST LUMPECTOMY WITH RADIOACTIVE SEED LOCALIZATION;  Surgeon: [name], MD;  Location[facility];  Service: General;  Laterality: Right;   CHOLECYSTECTOMY     COLONOSCOPY  02/15/2008   Kaplan    COLONOSCOPY WITH PROPOFOL   02/27/2018   Dr.Nandigam   ECTOPIC PREGNANCY SURGERY     ERCP N/A 06/14/2023   Procedure: ENDOSCOPIC RETROGRADE CHOLANGIOPANCREATOGRAPHY (ERCP);  Surgeon: Avram Lupita BRAVO, MD;  Location: [facility];  Service: Gastroenterology;  Laterality: N/A;   ERCP N/A 06/27/2023   Procedure: ENDOSCOPIC RETROGRADE CHOLANGIOPANCREATOGRAPHY (ERCP);  Surgeon: Charlanne Groom, MD;  Location: [facility];  Service: Gastroenterology;  Laterality: N/A;   ERCP N/A 07/08/2023   Procedure: ENDOSCOPIC RETROGRADE CHOLANGIOPANCREATOGRAPHY (ERCP);  Surgeon: Wilhelmenia Aloha Raddle., MD;  Location: [facility];  Service: Gastroenterology;  Laterality: N/A;   ESOPHAGOGASTRODUODENOSCOPY N/A 07/08/2023   Procedure: ESOPHAGOGASTRODUODENOSCOPY (EGD);  Surgeon: Wilhelmenia Aloha Raddle., MD;  Location: [facility];  Service: Gastroenterology;  Laterality: N/A;   ESOPHAGOGASTRODUODENOSCOPY (EGD) WITH PROPOFOL  N/A 06/14/2023   Procedure: ESOPHAGOGASTRODUODENOSCOPY (EGD) WITH PROPOFOL ;  Surgeon: Avram Lupita BRAVO, MD;  Location: [facility];  Service: Gastroenterology;  Laterality: N/A;   EUS N/A 07/08/2023   Procedure: UPPER ENDOSCOPIC ULTRASOUND (EUS) LINEAR;  Surgeon: Wilhelmenia Aloha Raddle., MD;  Location: [facility];  Service: Gastroenterology;  Laterality: N/A;   FINE NEEDLE ASPIRATION N/A 07/08/2023   Procedure: FINE NEEDLE ASPIRATION (FNA) LINEAR;  Surgeon: Wilhelmenia Aloha Raddle., MD;  Location: [facility];  Service: Gastroenterology;  Laterality: N/A;   IR IMAGING GUIDED PORT INSERTION  07/29/2023   JOINT  REPLACEMENT     Left hip total Dr. Melodi 10-01-17   MALONEY DILATION  06/14/2023   Procedure: AGAPITO DILATION;  Surgeon: Avram Lupita BRAVO, MD;  Location: [facility];  Service: Gastroenterology;;   MASTECTOMY Right 07/26/2019   PANCREATIC STENT PLACEMENT  06/14/2023   Procedure: PANCREATIC STENT PLACEMENT;  Surgeon: Avram Lupita BRAVO, MD;  Location: [facility];  Service: Gastroenterology;;   PANCREATIC STENT PLACEMENT  06/27/2023   Procedure: PANCREATIC STENT PLACEMENT;  Surgeon: Charlanne Groom, MD;  Location: [facility];  Service: Gastroenterology;;   POLYPECTOMY     polypectomy-oropharynx     RE-EXCISION OF BREAST CANCER,SUPERIOR MARGINS Right 04/08/2019   Procedure: RE-EXCISION OF RIGHT BREAST ANTERIOR MARGINS;  Surgeon: Curvin Deward III, MD;  Location: [facility];  Service: General;  Laterality: Right;   right knee meniscus     SBO  10/2016   small bowel obstruction   SIMPLE MASTECTOMY WITH AXILLARY SENTINEL NODE BIOPSY Right 07/26/2019   Procedure: RIGHT MASTECTOMY WITH SENTINEL NODE BIOPSY;  Surgeon: [name], MD;  Location: [facility];  Service: General;  Laterality: Right;   SPHINCTEROTOMY  06/27/2023   Procedure: SPHINCTEROTOMY;  Surgeon: Charlanne Groom, MD;  Location: [facility];  Service: Gastroenterology;;   ANNETT  07/08/2023   Procedure: ANNETT;  Surgeon: Wilhelmenia Aloha Raddle., MD;  Location: [facility];  Service: Gastroenterology;;   TOTAL HIP ARTHROPLASTY Left 10/01/2017   Procedure: LEFT  TOTAL HIP ARTHROPLASTY ANTERIOR APPROACH;  Surgeon: Melodi Lerner, MD;  Location: [facility];  Service: Orthopedics;  Laterality: Left;   TOTAL HIP ARTHROPLASTY Right 03/11/2018   Procedure: RIGHT TOTAL HIP ARTHROPLASTY ANTERIOR APPROACH;  Surgeon: Melodi Lerner, MD;  Location: [facility];  Service: Orthopedics;  Laterality: Right;    UPPER GASTROINTESTINAL ENDOSCOPY       FAMILY HISTORY:  Family History  Problem Relation Age of Onset   Cancer  Sister    Diabetes Brother    Diabetes Brother    Hypertension Other    Breast cancer Sister    Colon cancer Neg Hx    Colon polyps Neg Hx    Rectal cancer Neg Hx    Stomach cancer Neg Hx      SOCIAL HISTORY:  reports that she quit smoking about 7 years ago. Her smoking use included e-cigarettes and cigarettes. She started smoking about 39 years ago. She has a 32 pack-year smoking history. She has never used smokeless tobacco. She reports that she does not drink alcohol and does not use drugs.   ALLERGIES: Lisinopril , Oxaliplatin , Statins, and Atorvastatin    MEDICATIONS:  Current Outpatient Medications  Medication Sig Dispense Refill   albuterol  (VENTOLIN  HFA) 108 (90 Base) MCG/ACT inhaler Inhale 2 puffs into the lungs every 6 (six) hours as needed for wheezing or shortness of breath. 8 g 2   cetirizine  (ZYRTEC  ALLERGY) 10 MG tablet Take 1 tablet (10 mg total) by mouth daily. 30 tablet 1   feeding supplement (ENSURE PLUS HIGH PROTEIN) LIQD Take 237 mLs by mouth 2 (two) times daily between meals. 14220 mL 1   fluticasone  (FLONASE ) 50 MCG/ACT nasal spray Place 2 sprays into both nostrils daily. 16 g 6   Fluticasone -Umeclidin-Vilant (TRELEGY ELLIPTA ) 100-62.5-25 MCG/ACT AEPB Inhale 1 Inhalation into the lungs daily in the afternoon. 1 each 1   furosemide  (LASIX ) 20 MG tablet Take 1 tablet (20 mg total) by mouth daily. 30 tablet 0   Glucose Blood (BLOOD GLUCOSE TEST STRIPS) STRP 1 each by In Vitro route in the morning, at noon, and at bedtime. May substitute to any manufacturer covered by patient's insurance. 100 strip 5   guaiFENesin  (ROBITUSSIN) 100 MG/5ML liquid Take 5 mLs by mouth every 4 (four) hours as needed for cough or to loosen phlegm. 120 mL 0   insulin  glargine (LANTUS ) 100 UNIT/ML injection Inject 10 Units into the skin at bedtime.     lidocaine -prilocaine  (EMLA ) cream Apply 1 Application topically as needed (port).     losartan  (COZAAR ) 100 MG tablet Take 0.5 tablets (50 mg  total) by mouth daily.     NIFEdipine  (ADALAT  CC) 30 MG 24 hr tablet Take 1 tablet (30 mg total) by mouth daily. 30 tablet 1   predniSONE  (DELTASONE ) 10 MG tablet Prednisone  40 mg p.o. once daily for 3 days, then 30 mg p.o. once daily for 3 days, then 20 mg p.o. once daily for 3 days, then 10 mg p.o. once daily for 3 days and stop. 30 tablet 0   RELION PEN NEEDLES 31G X 6 MM MISC USE 1  THREE TIMES DAILY 100 each 0   senna-docusate (  SENOKOT-S) 8.6-50 MG tablet Take 1 tablet by mouth at bedtime as needed for mild constipation. 30 tablet 0   thyroid  (NP THYROID ) 90 MG tablet Take 1 tablet (90 mg total) by mouth daily. 30 tablet 5   No current facility-administered medications for this visit.     REVIEW OF SYSTEMS: On review of systems, the patient reports that *** is doing well overall. *** denies any chest pain, shortness of breath, cough, fevers, chills, night sweats, unintended weight changes. *** denies any bowel or bladder disturbances, and denies abdominal pain, nausea or vomiting. *** denies any new musculoskeletal or joint aches or pains. A complete review of systems is obtained and is otherwise negative.     PHYSICAL EXAM:  Wt Readings from Last 3 Encounters:  03/17/24 144 lb 12.8 oz (65.7 kg)  03/11/24 140 lb 6.4 oz (63.7 kg)  02/29/24 141 lb 8.6 oz (64.2 kg)   Temp Readings from Last 3 Encounters:  03/17/24 97.9 F (36.6 C) (Temporal)  02/29/24 97.8 F (36.6 C) (Oral)  02/10/24 97.7 F (36.5 C) (Temporal)   BP Readings from Last 3 Encounters:  03/17/24 (!) 140/60  03/11/24 99/61  02/29/24 112/69   Pulse Readings from Last 3 Encounters:  03/17/24 (!) 50  03/11/24 (!) 56  02/29/24 63    /10  In general this is a well appearing *** in no acute distress. ***'s alert and oriented x4 and appropriate throughout the examination. Cardiopulmonary assessment is negative for acute distress and *** exhibits normal effort.     ECOG = ***  0 - Asymptomatic (Fully active, able  to carry on all predisease activities without restriction)  1 - Symptomatic but completely ambulatory (Restricted in physically strenuous activity but ambulatory and able to carry out work of a light or sedentary nature. For example, light housework, office work)  2 - Symptomatic, <50% in bed during the day (Ambulatory and capable of all self care but unable to carry out any work activities. Up and about more than 50% of waking hours)  3 - Symptomatic, >50% in bed, but not bedbound (Capable of only limited self-care, confined to bed or chair 50% or more of waking hours)  4 - Bedbound (Completely disabled. Cannot carry on any self-care. Totally confined to bed or chair)  5 - Death   Raylene MM, Creech RH, Tormey DC, et al. [phone]). Toxicity and response criteria of the [facility]. Am. DOROTHA Bridges. Oncol. 5 (6): 649-55    LABORATORY DATA:  Lab Results  Component Value Date   WBC 11.1 (H) 03/17/2024   HGB 9.6 (L) 03/17/2024   HCT 29.3 (L) 03/17/2024   MCV 95.4 03/17/2024   PLT 176 03/17/2024   Lab Results  Component Value Date   NA 142 03/17/2024   K 3.9 03/17/2024   CL 110 03/17/2024   CO2 25 03/17/2024   Lab Results  Component Value Date   ALT 15 03/17/2024   AST 19 03/17/2024   ALKPHOS 86 03/17/2024   BILITOT 0.4 03/17/2024      RADIOGRAPHY: No results found.     IMPRESSION/PLAN: 1. Stage IB, cT2N0M0, unresectable adenocarcinoma of the head of the pancreas. Dr. Dewey discusses the pathology findings and reviews the nature of locally advanced pancreatic cancer. He would offer a course of chemoradiation as her duodenum is too close anatomically to consider stereotacic approaches. We discussed the risks, benefits, short, and long term effects of radiotherapy, as well as the curative intent, and the  patient is interested in proceeding. Dr. Dewey discusses the delivery and logistics of radiotherapy and anticipates a course of 5 1/2 weeks of radiotherapy to  the pancreatic head lesion with Xeloda chemosensitization. The patient will be contacted to coordinate treatment planning by our simulation department. We will anticipate starting the week of 04/12/24***   In a visit lasting *** minutes, greater than 50% of the time was spent face to face discussing the patient's condition, in preparation for the discussion, and coordinating the patient's care.   The above documentation reflects my direct findings during this shared patient visit. Please see the separate note by Dr. Dewey on this date for the remainder of the patient's plan of care.    Donald KYM Husband, [facility]   **Disclaimer: This note was dictated with voice recognition software. Similar sounding words can inadvertently be transcribed and this note may contain transcription errors which may not have been corrected upon publication of note.**

## 2024-03-30 NOTE — Progress Notes (Incomplete)
 GI Location of Tumor / Histology: Pancreas- Head   Adrienne [name] presented with obstructive jaundice.  Imaging noted a 2.5 cm mass in the head of the pancreas.  CT ABD 02/18/2024: Persistent pancreatic head mass measuring 2.8 cm with upstream ductal dilatation of the pancreatic duct.     Biopsies of Pancreas Lesion 07/08/2023    Past/Anticipated interventions by surgeon, if any:  -Not a surgical candidate per [facility]   Past/Anticipated interventions by medical oncology, if any:  Adrienne [name] -Neoadjuvant chemotherapy with FOLFIRINOX 08/05/2023- 11/11/2023 -Gemzar /Abraxane  12/02/2023-02/09/2024 -Chemotherapy with Xeloda and radiation therapy   Weight changes, if any: some, goes up and down  Bowel/Bladder complaints, if any: None  Nausea / Vomiting, if any: None  Pain issues, if any:  None  Appetite: Better    SAFETY ISSUES: Prior radiation? No  Pacemaker/ICD? No Possible current pregnancy? Hysterectomy Is the patient on methotrexate? No Patient has a chest port  Current Complaints/Details: None

## 2024-03-31 ENCOUNTER — Ambulatory Visit
Admission: RE | Admit: 2024-03-31 | Discharge: 2024-03-31 | Disposition: A | Discharge status: Home / Self Care | Source: Ambulatory Visit | Attending: Radiation Oncology | Admitting: Radiation Oncology

## 2024-03-31 ENCOUNTER — Encounter: Payer: Self-pay | Admitting: Radiation Oncology

## 2024-03-31 ENCOUNTER — Telehealth: Payer: Self-pay | Admitting: Hematology

## 2024-03-31 ENCOUNTER — Other Ambulatory Visit: Payer: Self-pay

## 2024-03-31 VITALS — BP 168/92 | HR 65 | Temp 97.9°F | Resp 20 | Ht 63.0 in | Wt 140.0 lb

## 2024-03-31 DIAGNOSIS — C250 Malignant neoplasm of head of pancreas: Secondary | ICD-10-CM | POA: Insufficient documentation

## 2024-03-31 DIAGNOSIS — Z794 Long term (current) use of insulin: Secondary | ICD-10-CM | POA: Insufficient documentation

## 2024-03-31 DIAGNOSIS — Z87891 Personal history of nicotine dependence: Secondary | ICD-10-CM | POA: Insufficient documentation

## 2024-03-31 DIAGNOSIS — G4730 Sleep apnea, unspecified: Secondary | ICD-10-CM | POA: Diagnosis not present

## 2024-03-31 DIAGNOSIS — Z79899 Other long term (current) drug therapy: Secondary | ICD-10-CM | POA: Diagnosis not present

## 2024-03-31 DIAGNOSIS — K8689 Other specified diseases of pancreas: Secondary | ICD-10-CM

## 2024-03-31 DIAGNOSIS — I110 Hypertensive heart disease with heart failure: Secondary | ICD-10-CM | POA: Insufficient documentation

## 2024-03-31 DIAGNOSIS — M109 Gout, unspecified: Secondary | ICD-10-CM | POA: Diagnosis not present

## 2024-03-31 DIAGNOSIS — Z7952 Long term (current) use of systemic steroids: Secondary | ICD-10-CM | POA: Diagnosis not present

## 2024-03-31 DIAGNOSIS — E119 Type 2 diabetes mellitus without complications: Secondary | ICD-10-CM | POA: Insufficient documentation

## 2024-03-31 DIAGNOSIS — E785 Hyperlipidemia, unspecified: Secondary | ICD-10-CM | POA: Insufficient documentation

## 2024-03-31 DIAGNOSIS — I509 Heart failure, unspecified: Secondary | ICD-10-CM | POA: Diagnosis not present

## 2024-03-31 DIAGNOSIS — I10 Essential (primary) hypertension: Secondary | ICD-10-CM | POA: Insufficient documentation

## 2024-03-31 DIAGNOSIS — E039 Hypothyroidism, unspecified: Secondary | ICD-10-CM | POA: Insufficient documentation

## 2024-03-31 DIAGNOSIS — Z79890 Hormone replacement therapy (postmenopausal): Secondary | ICD-10-CM | POA: Insufficient documentation

## 2024-03-31 DIAGNOSIS — C259 Malignant neoplasm of pancreas, unspecified: Secondary | ICD-10-CM

## 2024-03-31 DIAGNOSIS — M129 Arthropathy, unspecified: Secondary | ICD-10-CM | POA: Insufficient documentation

## 2024-03-31 DIAGNOSIS — Z803 Family history of malignant neoplasm of breast: Secondary | ICD-10-CM | POA: Insufficient documentation

## 2024-03-31 DIAGNOSIS — D0511 Intraductal carcinoma in situ of right breast: Secondary | ICD-10-CM

## 2024-03-31 DIAGNOSIS — Z9011 Acquired absence of right breast and nipple: Secondary | ICD-10-CM | POA: Diagnosis not present

## 2024-03-31 NOTE — Progress Notes (Signed)
 Verbal order w/readback from Dr. Lanny for KUB for bile duct stent evaluation/placement.  Order placed and pt will come in on 04/05/2024 for KUD & Labs.

## 2024-03-31 NOTE — Telephone Encounter (Signed)
 Adrienne [name] is scheduled and I have spoken with her daughter Luke about her lab appointment scheduled on 10/13 at 9:45am.

## 2024-04-02 ENCOUNTER — Encounter: Payer: Self-pay | Admitting: Cardiology

## 2024-04-02 ENCOUNTER — Encounter: Payer: Self-pay | Admitting: Hematology

## 2024-04-02 ENCOUNTER — Ambulatory Visit
Admission: RE | Admit: 2024-04-02 | Discharge: 2024-04-02 | Disposition: A | Discharge status: Home / Self Care | Source: Ambulatory Visit | Attending: Internal Medicine | Admitting: Internal Medicine

## 2024-04-02 DIAGNOSIS — Z1231 Encounter for screening mammogram for malignant neoplasm of breast: Secondary | ICD-10-CM

## 2024-04-05 ENCOUNTER — Ambulatory Visit (HOSPITAL_COMMUNITY)
Admission: RE | Admit: 2024-04-05 | Discharge: 2024-04-05 | Disposition: A | Discharge status: Home / Self Care | Source: Ambulatory Visit | Attending: Internal Medicine | Admitting: Internal Medicine

## 2024-04-05 ENCOUNTER — Inpatient Hospital Stay: Attending: Nurse Practitioner

## 2024-04-05 ENCOUNTER — Telehealth: Payer: Self-pay

## 2024-04-05 DIAGNOSIS — T451X5A Adverse effect of antineoplastic and immunosuppressive drugs, initial encounter: Secondary | ICD-10-CM | POA: Diagnosis not present

## 2024-04-05 DIAGNOSIS — K8689 Other specified diseases of pancreas: Secondary | ICD-10-CM | POA: Diagnosis not present

## 2024-04-05 DIAGNOSIS — Z87891 Personal history of nicotine dependence: Secondary | ICD-10-CM | POA: Insufficient documentation

## 2024-04-05 DIAGNOSIS — Z4682 Encounter for fitting and adjustment of non-vascular catheter: Secondary | ICD-10-CM | POA: Diagnosis not present

## 2024-04-05 DIAGNOSIS — C259 Malignant neoplasm of pancreas, unspecified: Secondary | ICD-10-CM | POA: Insufficient documentation

## 2024-04-05 DIAGNOSIS — C250 Malignant neoplasm of head of pancreas: Secondary | ICD-10-CM | POA: Diagnosis not present

## 2024-04-05 DIAGNOSIS — D0511 Intraductal carcinoma in situ of right breast: Secondary | ICD-10-CM | POA: Diagnosis not present

## 2024-04-05 DIAGNOSIS — Z9011 Acquired absence of right breast and nipple: Secondary | ICD-10-CM | POA: Insufficient documentation

## 2024-04-05 DIAGNOSIS — Z860100 Personal history of colon polyps, unspecified: Secondary | ICD-10-CM | POA: Diagnosis not present

## 2024-04-05 DIAGNOSIS — I517 Cardiomegaly: Secondary | ICD-10-CM | POA: Diagnosis not present

## 2024-04-05 DIAGNOSIS — J90 Pleural effusion, not elsewhere classified: Secondary | ICD-10-CM | POA: Diagnosis not present

## 2024-04-05 DIAGNOSIS — J188 Other pneumonia, unspecified organism: Secondary | ICD-10-CM | POA: Diagnosis not present

## 2024-04-05 DIAGNOSIS — G620 Drug-induced polyneuropathy: Secondary | ICD-10-CM | POA: Diagnosis not present

## 2024-04-05 DIAGNOSIS — R918 Other nonspecific abnormal finding of lung field: Secondary | ICD-10-CM | POA: Diagnosis not present

## 2024-04-05 LAB — COMPREHENSIVE METABOLIC PANEL WITH GFR
ALT: 15 U/L (ref 0–44)
AST: 23 U/L (ref 15–41)
Albumin: 3.7 g/dL (ref 3.5–5.0)
Alkaline Phosphatase: 81 U/L (ref 38–126)
Anion gap: 6 (ref 5–15)
BUN: 19 mg/dL (ref 8–23)
CO2: 27 mmol/L (ref 22–32)
Calcium: 9.5 mg/dL (ref 8.9–10.3)
Chloride: 110 mmol/L (ref 98–111)
Creatinine, Ser: 0.78 mg/dL (ref 0.44–1.00)
GFR, Estimated: >60 mL/min (ref >60)
Glucose, Bld: 93 mg/dL (ref 70–99)
Potassium: 3.8 mmol/L (ref 3.5–5.1)
Sodium: 143 mmol/L (ref 135–145)
Total Bilirubin: 0.4 mg/dL (ref 0.0–1.2)
Total Protein: 7 g/dL (ref 6.5–8.1)

## 2024-04-05 LAB — CBC WITH DIFFERENTIAL/PLATELET
Abs Immature Granulocytes: 0.03 K/uL (ref 0.00–0.07)
Basophils Absolute: 0 K/uL (ref 0.0–0.1)
Basophils Relative: 0 %
Eosinophils Absolute: 0.1 K/uL (ref 0.0–0.5)
Eosinophils Relative: 1 %
HCT: 32.3 % — ABNORMAL LOW (ref 36.0–46.0)
Hemoglobin: 10.5 g/dL — ABNORMAL LOW (ref 12.0–15.0)
Immature Granulocytes: 0 %
Lymphocytes Relative: 25 %
Lymphs Abs: 2.6 K/uL (ref 0.7–4.0)
MCH: 31.3 pg (ref 26.0–34.0)
MCHC: 32.5 g/dL (ref 30.0–36.0)
MCV: 96.4 fL (ref 80.0–100.0)
Monocytes Absolute: 0.7 K/uL (ref 0.1–1.0)
Monocytes Relative: 6 %
Neutro Abs: 7.2 K/uL (ref 1.7–7.7)
Neutrophils Relative %: 68 %
Platelets: 304 K/uL (ref 150–400)
RBC: 3.35 MIL/uL — ABNORMAL LOW (ref 3.87–5.11)
RDW: 17.2 % — ABNORMAL HIGH (ref 11.5–15.5)
WBC: 10.6 K/uL — ABNORMAL HIGH (ref 4.0–10.5)
nRBC: 0 % (ref 0.0–0.2)

## 2024-04-05 NOTE — Telephone Encounter (Signed)
 Copied from CRM [id number]. Topic: Clinical - Medical Advice >> Apr 05, 2024  9:24 AM Montie POUR wrote: Reason for CRM:  Cassadie would like to let Dr. Tobie know that her bottom number to her blood pressure for the past 2 days has been 90 and 100. Please call her at [phone] to discuss and see if her medications needs to be changed.

## 2024-04-06 ENCOUNTER — Other Ambulatory Visit: Payer: Self-pay | Admitting: Internal Medicine

## 2024-04-06 ENCOUNTER — Ambulatory Visit: Payer: Self-pay | Admitting: Internal Medicine

## 2024-04-06 DIAGNOSIS — I428 Other cardiomyopathies: Secondary | ICD-10-CM

## 2024-04-06 MED ORDER — FUROSEMIDE 20 MG PO TABS: 20.0000 mg | ORAL_TABLET | Freq: Every day | ORAL | 2 refills | Status: DC

## 2024-04-07 ENCOUNTER — Telehealth: Payer: Self-pay | Admitting: Hematology

## 2024-04-07 ENCOUNTER — Telehealth: Payer: Self-pay

## 2024-04-07 NOTE — Telephone Encounter (Signed)
 [name] has been contacted and made aware of [name]'s follow up appointment scheduled for 10/20 to discuss results of KUB.

## 2024-04-07 NOTE — Telephone Encounter (Signed)
-----   Message from [facility] sent at 04/07/2024  4:50 PM EDT ----- Regarding: RE: Adrienne [name], Schedule her for November 17 or November 24 for EUS/ERCP for pancreas cancer malignancy potential restenting and fiducial marker placement.  Thanks. GM ----- Message ----- From: [name], MD Sent: 04/07/2024  12:39 PM EDT To: Donald Estefana Husband, PA-C; Aloha Mansour# Subject: RE: Adrienne                                        I will see her next week and monitor her lab. ----- Message ----- From: Husband Donald Estefana, PA-C Sent: 04/07/2024  11:20 AM EDT To: Callander Lanny, MD; Aloha Wilhelmenia Raddle., MD; Sh# Subject: RE: Adrienne                                        Thanks so much! I think aiming for 11/17 or 11/24 would be great. We were going to get a restaging CT anyway depending on the timing. Dr. Lanny do you want us  to get labs weekly or something similar until then? ----- Message ----- From: Mansouraty, Aloha Raddle., MD Sent: 04/07/2024   5:12 AM EDT To: Donald Estefana Husband, PA-C; Callander Lanny, MD; S# Subject: RE: Adrienne                                        I agree, Very interesting and she is not obstructed. There remains some mild biliary dilation though. We need fiducials but I'm not sure about the stent right now if she is not overtly obstructed. I can do both procedures on 11/17 or 11/24 but I don't really have anything sooner in regards to availability. Unless cancellations occur and then I could just do EUS. Let me know what you all want me to do. GM ----- Message ----- From: [name], MD Sent: 04/06/2024  10:40 AM EDT To: Donald Estefana Husband, PA-C; Aloha Mansour# Subject: RE: Adrienne                                        Gabe,  It looks like her stent is out of CBD and her LFT yesterday was normal.   Callander ----- Message ----- From: Seena Anders SQUIBB, RN Sent: 04/06/2024  10:30 AM EDT To: Donald Estefana Husband, PA-C; Callander Lanny, MD; G# Subject: Adrienne                                             Good morning,  Pt's Adrienne has resulted if you have not already reviewed the results.  Thanks,  Anders

## 2024-04-08 ENCOUNTER — Other Ambulatory Visit: Payer: Self-pay

## 2024-04-08 DIAGNOSIS — C250 Malignant neoplasm of head of pancreas: Secondary | ICD-10-CM

## 2024-04-08 NOTE — Telephone Encounter (Signed)
 EUS ERCP Fiducial marker set up for 05/10/24 at 11 am at [facility] with GM    Left message on machine to call back

## 2024-04-09 NOTE — Telephone Encounter (Signed)
EUSERCP scheduled, pt instructed and medications reviewed.  Patient instructions mailed to home.  Patient to call with any questions or concerns.  

## 2024-04-12 ENCOUNTER — Inpatient Hospital Stay: Admitting: Hematology

## 2024-04-12 VITALS — BP 136/78 | HR 60 | Temp 98.3°F | Resp 17 | Ht 63.0 in | Wt 143.5 lb

## 2024-04-12 DIAGNOSIS — C250 Malignant neoplasm of head of pancreas: Secondary | ICD-10-CM | POA: Diagnosis not present

## 2024-04-12 NOTE — Progress Notes (Signed)
 [facility]   [phone] [phone]   Clinic Follow up Note   Patient Care Team: [name], MD as PCP - General (Internal Medicine) [name], MD as PCP - Cardiology (Cardiology) [name], RN as Oncology Nurse Navigator [name], MD as Consulting Physician (General Surgery) [name], MD as Consulting Physician (Hematology) [name], MD as Attending Physician (Radiation Oncology) Nicholaus Sherlean CROME, [facility] (Inactive) as Pharmacist (Pharmacist)  Date of Service:  04/12/2024  CHIEF COMPLAINT: f/u of pancreatic cancer  CURRENT THERAPY:  Pending pancreatic radiation  Oncology History   Malignant neoplasm of head of pancreas (HCC) cT2N0M0, stage IB -Patient presented with obstructive jaundice, CT and MRI showed a 2.5 cm mass in the head of the pancreas, with pancreatic duct and biliary duct dilatation.  ERCP was attempted twice but failed due to deformed duodenum -She underwent ERCP and stent placement, EUS with fine-needle biopsy of the pancreatic mass by Dr. Wilhelmenia on July 08, 2023.  Biopsy confirmed adenocarcinoma. -Due to the tumor invasion to portal vein and SMV, I recommended neoadjuvant chemotherapy FOLFIRINOX, she started on 08/05/2023 -restaging CT 11/05/2023 showed stable disease  - Patient was seen by pancreatobiliary surgeon Dr. Dasie, who also consulted her colleagues at [facility], and they recommend a total of 6 months neoadjuvant chemotherapy.  Will change her chemo to second line gemcitabine  and Abraxane  for additional 3 months. She started on 12/02/2023 and completed on 02/09/2024 - Her case was reviewed by [facility] pancreatobiliary surgeons, surgery was not recommended.  We recommend consolidation radiation.  Patient was seen by Dr. Dewey, she is a candidate for SBRT, but fiducial placement will not be available until November 2025  Assessment & Plan Malignant neoplasm of head of pancreas She has a malignant  neoplasm of the head of the pancreas. She has been off chemotherapy for two months and is scheduled for radiation therapy after Thanksgiving. The radiation therapy is delayed due to scheduling issues. Chemotherapy and radiation are not curative, and there is a risk of cancer progression while off treatment. She declined further chemotherapy due to previous adverse effects, including peripheral neuropathy from Abraxane . The oncologist discussed the possibility of using a single agent, gemcitabine , which has less risk of neuropathy, but she still declined. - Order CT scan before radiation therapy to establish a baseline - Proceed with radiation therapy if the cancer is still contained in the pancreas  Chemotherapy-induced peripheral neuropathy She experienced peripheral neuropathy from previous chemotherapy, specifically from Abraxane . This was a significant factor in her decision to decline further chemotherapy.  Biliary stent management and monitoring for obstructive jaundice Her biliary stent has migrated from the bile duct to the bowel. She is currently asymptomatic, but there is a risk of obstructive jaundice if the tumor compresses the bile duct. - Schedule lab tests every two weeks to monitor liver function - Instruct her to report any signs of jaundice, such as dark urine or yellowing of the eyes - Ensure adequate hydration - Schedule stent removal and replacement procedure on November 17  Plan - We discussed restarting chemotherapy while she is waiting for fiducial placement and radiation, she declined. - Will monitor her LFTs, will repeat lab next week and again in 3 weeks - Will repeat CT scan in 3 weeks before radiation to make sure she does not have metastatic disease. - Phone visit after CT scan.   SUMMARY OF ONCOLOGIC HISTORY: Oncology History  Ductal carcinoma in situ (DCIS) of right breast  01/28/2019 Mammogram   Diagnostic Mammogram 01/28/19  IMPRESSION: Right breast  retroareolar 7 mm grouped pleomorphic calcifications are suspicious.   02/02/2019 Initial Biopsy   Diagnosis 02/02/19 Breast, right, needle core biopsy, outer retroareolar - DUCTAL CARCINOMA IN SITU WITH CALCIFICATIONS. Microscopic Comment The ductal carcinoma in situ is intermediate grade. Estrogen and progesterone receptors will be performed.  Results: IMMUNOHISTOCHEMICAL AND MORPHOMETRIC ANALYSIS PERFORMED MANUALLY Estrogen Receptor: 90%, POSITIVE, STRONG STAINING INTENSITY Progesterone Receptor: 20%, POSITIVE, STRONG STAINING INTENSITY    02/02/2019 Cancer Staging   Staging form: Breast, AJCC 8th Edition - Clinical stage from 02/02/2019: Stage 0 (cTis (DCIS), cN0, cM0, G2, ER+, PR+, HER2: Not Assessed) - Signed by [name], MD on 02/10/2019   02/04/2019 Initial Diagnosis   Ductal carcinoma in situ (DCIS) of right breast   03/18/2019 Surgery   RIGHT BREAST LUMPECTOMY WITH RADIOACTIVE SEED LOCALIZATION by Dr. Curvin 03/18/19   03/18/2019 Pathology Results   FINAL MICROSCOPIC DIAGNOSIS: 03/18/19 A. BREAST, RIGHT, LUMPECTOMY:  - Ductal carcinoma in situ with calcifications and necrosis, 1.4 cm.  - Ductal carcinoma in situ focally 0.1 cm from medial and anterior  lumpectomy margins.  - Ductal carcinoma in situ involves the final anterior margin (See part  D).  - Ductal carcinoma in situ focally 0.3 cm from superior lumpectomy  margin.  - Biopsy site and biopsy clip.   B. BREAST, RIGHT LATERAL MARGIN, EXCISION:  - Benign breast tissue.  - No ductal carcinoma in situ.  - Final lateral margin greater than 1 cm.   C. BREAST, RIGHT MEDIAL MARGIN, EXCISION:  - Ductal carcinoma in situ, 0.3 cm.  - Ductal carcinoma in situ with focally 0.3 cm from final medial margin.   D. BREAST, RIGHT ANTERIOR MARGIN, EXCISION:  - Ductal carcinoma in situ, 1.5 cm.  - Ductal carcinoma in situ focally involves final anterior margin.   E. BREAST, RIGHT DEEP MARGIN, EXCISION:  - Fibrocystic changes.  -  No ductal carcinoma in situ.  - Final posterior margin greater than 0.7 cm.    04/08/2019 Surgery   RE-EXCISION OF RIGHT BREAST ANTERIOR MARGINS by Dr Curvin 04/08/19   04/08/2019 Pathology Results    DIAGNOSIS: 04/08/19  A. BREAST, RIGHT, ANTERIOR SUPERIOR, EXCISION:  - Intermediate grade ductal carcinoma in situ with necrosis.  - In situ carcinoma is <1 mm (not on ink) from the new anterior superior  margin, multifocally.  - Intraductal papilloma.  - Fibrocystic change.   B. BREAST, RIGHT, ANTERIOR INFERIOR, EXCISION:  - Benign breast tissue with resection changes.   C. BREAST, RIGHT, MEDIAL, EXCISION:  - Benign breast tissue with resection changes.  - Fibroadenomatoid and fibrocystic changes.   05/06/2019 Mammogram   Right Mammogram 05/06/19 IMPRESSION: Suspicious finding with 2 groups of microcalcifications in the medial right breast. The anterior group measures 2 x 2 x 2 mm. The more posterior group measures 4 x 3 x 4 mm.   05/12/2019 Pathology Results   Diagnosis 05/12/19 1. Breast, right, needle core biopsy, medial - DUCTAL CARCINOMA IN SITU WITH CALCIFICATIONS. - SEE MICROSCOPIC DESCRIPTION. 2. Breast, right, needle core biopsy, medial - DUCTAL CARCINOMA IN SITU WITH CALCIFICATIONS. - SEE MICROSCOPIC DESCRIPTION. Results: IMMUNOHISTOCHEMICAL AND MORPHOMETRIC ANALYSIS PERFORMED MANUALLY Estrogen Receptor: 100%, POSITIVE, STRONG STAINING INTENSITY Progesterone Receptor: 0%, NEGATIVE COMMENT: The negative hormone receptor study(ies) in this case has an internal positive control. Results: IMMUNOHISTOCHEMICAL AND MORPHOMETRIC ANALYSIS PERFORMED MANUALLY Estrogen Receptor: 100%, POSITIVE, STRONG STAINING INTENSITY Progesterone Receptor: 0%, NEGATIVE COMMENT: The negative hormone receptor study(ies) in  this case has an internal positive control.   07/26/2019 Surgery   RIGHT MASTECTOMY WITH SENTINEL NODE BIOPSY by Dr. Curvin 07/26/19   07/26/2019 Pathology Results   FINAL  MICROSCOPIC DIAGNOSIS: 07/26/19  A. LYMPH NODE, RIGHT #1, SENTINEL,  BIOPSY:  -  No carcinoma identified in one lymph node (0/1)   B. BREAST, RIGHT, MASTECTOMY:  -  Ductal carcinoma in situ, intermediate grade, 0.6 cm  -  Margins uninvolved by carcinoma (greater than 1 cm; posterior margin)  -  Previous biopsy site changes (x2)  -  See oncology table below   07/26/2019 Cancer Staging   Staging form: Breast, AJCC 8th Edition - Pathologic stage from 07/26/2019: Stage 0 (pTis (DCIS), pN0, cM0, G2, ER+, PR+, HER2: Not Assessed) - Signed by [name], MD on 08/10/2019   08/15/2023 Genetic Testing   Negative [facility] CancerNext-Expanded +RNAinsight Panel.  VUS in ATM at c.4910-891G>A and SDHA at c.-1C>G.  Report date is 08/15/2023.   The CancerNext-Expanded gene panel offered by [facility] and includes sequencing, rearrangement, and RNA analysis for the following 76 genes: AIP, ALK, APC, ATM, AXIN2, BAP1, BARD1, BMPR1A, BRCA1, BRCA2, BRIP1, CDC73, CDH1, CDK4, CDKN1B, CDKN2A, CEBPA, CHEK2, CTNNA1, DDX41, DICER1, ETV6, FH, FLCN, GATA2, LZTR1, MAX, MBD4, MEN1, MET, MLH1, MSH2, MSH3, MSH6, MUTYH, NF1, NF2, NTHL1, PALB2, PHOX2B, PMS2, POT1, PRKAR1A, PTCH1, PTEN, RAD51C, RAD51D, RB1, RET, RUNX1, SDHA, SDHAF2, SDHB, SDHC, SDHD, SMAD4, SMARCA4, SMARCB1, SMARCE1, STK11, SUFU, TMEM127, TP53, TSC1, TSC2, VHL, and WT1 (sequencing and deletion/duplication); EGFR, HOXB13, KIT, MITF, PDGFRA, POLD1, and POLE (sequencing only); EPCAM and GREM1 (deletion/duplication only).    Malignant neoplasm of head of pancreas (HCC)  07/07/2022 Cancer Staging   Staging form: Exocrine Pancreas, AJCC 8th Edition - Clinical stage from 07/07/2022: Stage IB (cT2, cN0, cM0) - Signed by [name], MD on 07/22/2023 Stage prefix: Initial diagnosis Total positive nodes: 0   06/13/2023 Initial Diagnosis   Pancreatic adenocarcinoma (HCC)   08/06/2023 - 11/13/2023 Chemotherapy   Patient is on Treatment Plan : PANCREAS Modified FOLFIRINOX q14d x 4  cycles     08/15/2023 Genetic Testing   Negative [facility] CancerNext-Expanded +RNAinsight Panel.  VUS in ATM at c.4910-891G>A and SDHA at c.-1C>G.  Report date is 08/15/2023.   The CancerNext-Expanded gene panel offered by [facility] and includes sequencing, rearrangement, and RNA analysis for the following 76 genes: AIP, ALK, APC, ATM, AXIN2, BAP1, BARD1, BMPR1A, BRCA1, BRCA2, BRIP1, CDC73, CDH1, CDK4, CDKN1B, CDKN2A, CEBPA, CHEK2, CTNNA1, DDX41, DICER1, ETV6, FH, FLCN, GATA2, LZTR1, MAX, MBD4, MEN1, MET, MLH1, MSH2, MSH3, MSH6, MUTYH, NF1, NF2, NTHL1, PALB2, PHOX2B, PMS2, POT1, PRKAR1A, PTCH1, PTEN, RAD51C, RAD51D, RB1, RET, RUNX1, SDHA, SDHAF2, SDHB, SDHC, SDHD, SMAD4, SMARCA4, SMARCB1, SMARCE1, STK11, SUFU, TMEM127, TP53, TSC1, TSC2, VHL, and WT1 (sequencing and deletion/duplication); EGFR, HOXB13, KIT, MITF, PDGFRA, POLD1, and POLE (sequencing only); EPCAM and GREM1 (deletion/duplication only).    12/02/2023 -  Chemotherapy   Patient is on Treatment Plan : PANCREATIC Abraxane  D1,8 + Gemcitabine  D1,8 q21d        Discussed the use of AI scribe software for clinical note transcription with the patient, who gave verbal consent to proceed.  History of Present Illness Adrienne [name] is a 78 year old female with pancreatic cancer who presents for follow-up.  She has experienced significant improvement in appetite and energy levels since discontinuing chemotherapy a couple of months ago. Her last chemotherapy cycle was in August, where she received gemcitabine  after experiencing neuropathy from Abraxane . She is concerned about  the neuropathy worsening and is hesitant to resume chemotherapy, describing the previous treatment as 'too hard' and 'too much.'  The stent, initially placed due to jaundice, has migrated from the bile duct to the bowel, but she is not experiencing any pain or issues currently. She monitors her urine color closely, as dark urine was an initial sign of jaundice. No stomach  pain is reported.     All other systems were reviewed with the patient and are negative.  MEDICAL HISTORY:  Past Medical History:  Diagnosis Date   Allergy    Ambulates with cane    straight cane   Arthritis    knee, back   CHF (congestive heart failure) (HCC)    Ductal carcinoma in situ of breast 12/2018   R Breast-mastectomy only   Gout    History of blood transfusion 1986   w/ Hysterectomy surgery   Hyperlipidemia    diet controlled - no meds   Hypertension    Hypothyroidism    Pelvic kidney    lower right pelvic kidney    SBO (small bowel obstruction) (HCC) 10/2016   surgery    Sleep apnea    Mild - no mask needed per sleep study   Smoker    quit smoking 2018   Type 2 diabetes mellitus (HCC)     SURGICAL HISTORY: Past Surgical History:  Procedure Laterality Date   ABDOMINAL HYSTERECTOMY  1986   COMPLETE-precancerous   APPENDECTOMY     pt states it was removed when gallbladder was removed.   AUGMENTATION MAMMAPLASTY     BACK SURGERY     lower back   BILIARY BRUSHING  07/08/2023   Procedure: BILIARY BRUSHING;  Surgeon: Wilhelmenia Aloha Raddle., MD;  Location: [facility];  Service: Gastroenterology;;   BILIARY STENT PLACEMENT N/A 07/08/2023   Procedure: BILIARY STENT PLACEMENT;  Surgeon: Wilhelmenia Aloha Raddle., MD;  Location: [facility];  Service: Gastroenterology;  Laterality: N/A;   BIOPSY  06/14/2023   Procedure: BIOPSY;  Surgeon: Avram Lupita BRAVO, MD;  Location: [facility];  Service: Gastroenterology;;   BIOPSY  07/08/2023   Procedure: BIOPSY;  Surgeon: Wilhelmenia Aloha Raddle., MD;  Location: [facility];  Service: Gastroenterology;;   BREAST BIOPSY Right 05/12/2019   times 2   BREAST LUMPECTOMY WITH RADIOACTIVE SEED LOCALIZATION Right 03/18/2019   Procedure: RIGHT BREAST LUMPECTOMY WITH RADIOACTIVE SEED LOCALIZATION;  Surgeon: [name], MD;  Location: [facility];  Service: General;  Laterality: Right;   CHOLECYSTECTOMY     COLONOSCOPY  02/15/2008    Kaplan    COLONOSCOPY WITH PROPOFOL   02/27/2018   Dr.Nandigam   ECTOPIC PREGNANCY SURGERY     ERCP N/A 06/14/2023   Procedure: ENDOSCOPIC RETROGRADE CHOLANGIOPANCREATOGRAPHY (ERCP);  Surgeon: Avram Lupita BRAVO, MD;  Location: [facility];  Service: Gastroenterology;  Laterality: N/A;   ERCP N/A 06/27/2023   Procedure: ENDOSCOPIC RETROGRADE CHOLANGIOPANCREATOGRAPHY (ERCP);  Surgeon: Charlanne Groom, MD;  Location: [facility];  Service: Gastroenterology;  Laterality: N/A;   ERCP N/A 07/08/2023   Procedure: ENDOSCOPIC RETROGRADE CHOLANGIOPANCREATOGRAPHY (ERCP);  Surgeon: Wilhelmenia Aloha Raddle., MD;  Location: [facility];  Service: Gastroenterology;  Laterality: N/A;   ESOPHAGOGASTRODUODENOSCOPY N/A 07/08/2023   Procedure: ESOPHAGOGASTRODUODENOSCOPY (EGD);  Surgeon: Wilhelmenia Aloha Raddle., MD;  Location: [facility];  Service: Gastroenterology;  Laterality: N/A;   ESOPHAGOGASTRODUODENOSCOPY (EGD) WITH PROPOFOL  N/A 06/14/2023   Procedure: ESOPHAGOGASTRODUODENOSCOPY (EGD) WITH PROPOFOL ;  Surgeon: Avram Lupita BRAVO, MD;  Location: [facility];  Service: Gastroenterology;  Laterality: N/A;   EUS N/A 07/08/2023  Procedure: UPPER ENDOSCOPIC ULTRASOUND (EUS) LINEAR;  Surgeon: Wilhelmenia Aloha Raddle., MD;  Location: [facility];  Service: Gastroenterology;  Laterality: N/A;   FINE NEEDLE ASPIRATION N/A 07/08/2023   Procedure: FINE NEEDLE ASPIRATION (FNA) LINEAR;  Surgeon: Wilhelmenia Aloha Raddle., MD;  Location: [facility];  Service: Gastroenterology;  Laterality: N/A;   IR IMAGING GUIDED PORT INSERTION  07/29/2023   JOINT REPLACEMENT     Left hip total Dr. Melodi 10-01-17   MALONEY DILATION  06/14/2023   Procedure: AGAPITO DILATION;  Surgeon: Avram Lupita BRAVO, MD;  Location: [facility];  Service: Gastroenterology;;   MASTECTOMY Right 07/26/2019   PANCREATIC STENT PLACEMENT  06/14/2023   Procedure: PANCREATIC STENT PLACEMENT;  Surgeon: Avram Lupita BRAVO, MD;  Location: [facility];  Service:  Gastroenterology;;   PANCREATIC STENT PLACEMENT  06/27/2023   Procedure: PANCREATIC STENT PLACEMENT;  Surgeon: Charlanne Groom, MD;  Location: [facility];  Service: Gastroenterology;;   POLYPECTOMY     polypectomy-oropharynx     RE-EXCISION OF BREAST CANCER,SUPERIOR MARGINS Right 04/08/2019   Procedure: RE-EXCISION OF RIGHT BREAST ANTERIOR MARGINS;  Surgeon: Curvin Mt III, MD;  Location: [facility];  Service: General;  Laterality: Right;   right knee meniscus     SBO  10/2016   small bowel obstruction   SIMPLE MASTECTOMY WITH AXILLARY SENTINEL NODE BIOPSY Right 07/26/2019   Procedure: RIGHT MASTECTOMY WITH SENTINEL NODE BIOPSY;  Surgeon: Curvin Mt MOULD, MD;  Location: [facility];  Service: General;  Laterality: Right;   SPHINCTEROTOMY  06/27/2023   Procedure: SPHINCTEROTOMY;  Surgeon: Charlanne Groom, MD;  Location: [facility];  Service: Gastroenterology;;   ANNETT  07/08/2023   Procedure: ANNETT;  Surgeon: Wilhelmenia Aloha Raddle., MD;  Location: [facility];  Service: Gastroenterology;;   TOTAL HIP ARTHROPLASTY Left 10/01/2017   Procedure: LEFT  TOTAL HIP ARTHROPLASTY ANTERIOR APPROACH;  Surgeon: Melodi Lerner, MD;  Location: [facility];  Service: Orthopedics;  Laterality: Left;   TOTAL HIP ARTHROPLASTY Right 03/11/2018   Procedure: RIGHT TOTAL HIP ARTHROPLASTY ANTERIOR APPROACH;  Surgeon: Melodi Lerner, MD;  Location: [facility];  Service: Orthopedics;  Laterality: Right;    UPPER GASTROINTESTINAL ENDOSCOPY      I have reviewed the social history and family history with the patient and they are unchanged from previous note.  ALLERGIES:  is allergic to lisinopril , oxaliplatin , statins, and atorvastatin .  MEDICATIONS:  Current Outpatient Medications  Medication Sig Dispense Refill   albuterol  (VENTOLIN  HFA) 108 (90 Base) MCG/ACT inhaler Inhale 2 puffs into the lungs every 6 (six) hours as needed for wheezing or shortness of breath. 8 g 2   cetirizine  (ZYRTEC   ALLERGY) 10 MG tablet Take 1 tablet (10 mg total) by mouth daily. 30 tablet 1   feeding supplement (ENSURE PLUS HIGH PROTEIN) LIQD Take 237 mLs by mouth 2 (two) times daily between meals. 14220 mL 1   fluticasone  (FLONASE ) 50 MCG/ACT nasal spray Place 2 sprays into both nostrils daily. 16 g 6   Fluticasone -Umeclidin-Vilant (TRELEGY ELLIPTA ) 100-62.5-25 MCG/ACT AEPB Inhale 1 Inhalation into the lungs daily in the afternoon. 1 each 1   furosemide  (LASIX ) 20 MG tablet Take 1 tablet (20 mg total) by mouth daily. 30 tablet 2   Glucose Blood (BLOOD GLUCOSE TEST STRIPS) STRP 1 each by In Vitro route in the morning, at noon, and at bedtime. May substitute to any manufacturer covered by patient's insurance. 100 strip 5   guaiFENesin  (ROBITUSSIN) 100 MG/5ML liquid Take 5 mLs by mouth every 4 (four) hours as  needed for cough or to loosen phlegm. 120 mL 0   insulin  glargine (LANTUS ) 100 UNIT/ML injection Inject 10 Units into the skin at bedtime.     lidocaine -prilocaine  (EMLA ) cream Apply 1 Application topically as needed (port).     losartan  (COZAAR ) 100 MG tablet Take 0.5 tablets (50 mg total) by mouth daily.     NIFEdipine  (ADALAT  CC) 30 MG 24 hr tablet Take 1 tablet (30 mg total) by mouth daily. 30 tablet 1   predniSONE  (DELTASONE ) 10 MG tablet Prednisone  40 mg p.o. once daily for 3 days, then 30 mg p.o. once daily for 3 days, then 20 mg p.o. once daily for 3 days, then 10 mg p.o. once daily for 3 days and stop. 30 tablet 0   RELION PEN NEEDLES 31G X 6 MM MISC USE 1  THREE TIMES DAILY 100 each 0   senna-docusate (SENOKOT-S) 8.6-50 MG tablet Take 1 tablet by mouth at bedtime as needed for mild constipation. 30 tablet 0   thyroid  (NP THYROID ) 90 MG tablet Take 1 tablet (90 mg total) by mouth daily. 30 tablet 5   No current facility-administered medications for this visit.    PHYSICAL EXAMINATION: ECOG PERFORMANCE STATUS: 1 - Symptomatic but completely ambulatory  Vitals:   04/12/24 1300 04/12/24 1347   BP: (!) 146/80 136/78  Pulse: 60   Resp: 17   Temp: 98.3 F (36.8 C)   SpO2: 97%    Wt Readings from Last 3 Encounters:  04/12/24 143 lb 8 oz (65.1 kg)  03/31/24 140 lb (63.5 kg)  03/17/24 144 lb 12.8 oz (65.7 kg)     GENERAL:alert, no distress and comfortable SKIN: skin color, texture, turgor are normal, no rashes or significant lesions EYES: normal, Conjunctiva are pink and non-injected, sclera mildly icteric  Musculoskeletal:no cyanosis of digits and no clubbing  NEURO: alert & oriented x 3 with fluent speech, no focal motor/sensory deficits  Physical Exam   LABORATORY DATA:  I have reviewed the data as listed    Latest Ref Rng & Units 04/05/2024   10:05 AM 03/17/2024   10:51 AM 02/29/2024    2:36 AM  CBC  WBC 4.0 - 10.5 K/uL 10.6  11.1  14.4   Hemoglobin 12.0 - 15.0 g/dL 89.4  9.6  9.1   Hematocrit 36.0 - 46.0 % 32.3  29.3  29.2   Platelets 150 - 400 K/uL 304  176  691         Latest Ref Rng & Units 04/05/2024   10:05 AM 03/17/2024   10:51 AM 02/29/2024    2:36 AM  CMP  Glucose 70 - 99 mg/dL 93  831  871   BUN 8 - 23 mg/dL 19  17  15    Creatinine 0.44 - 1.00 mg/dL 9.21  8.84  9.26   Sodium 135 - 145 mmol/L 143  142  135   Potassium 3.5 - 5.1 mmol/L 3.8  3.9  4.5   Chloride 98 - 111 mmol/L 110  110  100   CO2 22 - 32 mmol/L 27  25  21    Calcium  8.9 - 10.3 mg/dL 9.5  8.6  9.9   Total Protein 6.5 - 8.1 g/dL 7.0  6.6    Total Bilirubin 0.0 - 1.2 mg/dL 0.4  0.4    Alkaline Phos 38 - 126 U/L 81  86    AST 15 - 41 U/L 23  19    ALT 0 - 44 U/L 15  15        RADIOGRAPHIC STUDIES: I have personally reviewed the radiological images as listed and agreed with the findings in the report. No results found.    Orders Placed This Encounter  Procedures   CT PANCREAS ABD W/WO    Standing Status:   Future    Expected Date:   05/03/2024    Expiration Date:   04/12/2025    If indicated for the ordered procedure, I authorize the administration of contrast media per  Radiology protocol:   Yes    Does the patient have a contrast media/X-ray dye allergy?:   No    Preferred imaging location?:   [facility]   All questions were answered. The patient knows to call the clinic with any problems, questions or concerns. No barriers to learning was detected. The total time spent in the appointment was 25 minutes, including review of chart and various tests results, discussions about plan of care and coordination of care plan     Onita Mattock, MD 04/12/2024

## 2024-04-12 NOTE — Assessment & Plan Note (Signed)
 cT2N0M0, stage IB -Patient presented with obstructive jaundice, CT and MRI showed a 2.5 cm mass in the head of the pancreas, with pancreatic duct and biliary duct dilatation.  ERCP was attempted twice but failed due to deformed duodenum -She underwent ERCP and stent placement, EUS with fine-needle biopsy of the pancreatic mass by Dr. Wilhelmenia on July 08, 2023.  Biopsy confirmed adenocarcinoma. -Due to the tumor invasion to portal vein and SMV, I recommended neoadjuvant chemotherapy FOLFIRINOX, she started on 08/05/2023 -restaging CT 11/05/2023 showed stable disease  - Patient was seen by pancreatobiliary surgeon Dr. Dasie, who also consulted her colleagues at [facility], and they recommend a total of 6 months neoadjuvant chemotherapy.  Will change her chemo to second line gemcitabine  and Abraxane  for additional 3 months. She started on 12/02/2023 and completed on 02/09/2024 - Her case was reviewed by [facility] pancreatobiliary surgeons, surgery was not recommended.  We recommend consolidation radiation.  Patient was seen by Dr. Dewey, she is a candidate for SBRT, but fiducial placement will not be available until November 2025

## 2024-04-13 ENCOUNTER — Other Ambulatory Visit (HOSPITAL_COMMUNITY): Payer: Self-pay

## 2024-04-13 ENCOUNTER — Telehealth: Payer: Self-pay

## 2024-04-13 NOTE — Telephone Encounter (Signed)
 Copied from CRM #[id number]. Topic: Clinical - Medication Question >> Apr 13, 2024 11:12 AM Charlet HERO wrote: Reason for CRM: Patient is calling to see if she needs to schedule an appt for meds for her neuropathy she states that Dr Tobie already knows about it. Would like to have a call back with the answer. States that they have already spoken about getting meds but she did not say yes at the time.

## 2024-04-14 ENCOUNTER — Other Ambulatory Visit: Payer: Self-pay

## 2024-04-14 NOTE — Telephone Encounter (Signed)
Scheduled video for tomorrow

## 2024-04-15 ENCOUNTER — Telehealth (INDEPENDENT_AMBULATORY_CARE_PROVIDER_SITE_OTHER): Admitting: Internal Medicine

## 2024-04-15 ENCOUNTER — Encounter: Payer: Self-pay | Admitting: Internal Medicine

## 2024-04-15 ENCOUNTER — Encounter: Payer: Self-pay | Admitting: Radiation Oncology

## 2024-04-15 DIAGNOSIS — T451X5A Adverse effect of antineoplastic and immunosuppressive drugs, initial encounter: Secondary | ICD-10-CM | POA: Diagnosis not present

## 2024-04-15 DIAGNOSIS — G620 Drug-induced polyneuropathy: Secondary | ICD-10-CM | POA: Diagnosis not present

## 2024-04-15 MED ORDER — GABAPENTIN 100 MG PO CAPS: 100.0000 mg | ORAL_CAPSULE | Freq: Every day | ORAL | 5 refills | Status: AC

## 2024-04-15 NOTE — Assessment & Plan Note (Signed)
 Burning sensation and tingling in bilateral hands and feet likely due to chemotherapy induced peripheral neuropathy Had discussed about possible treatment options in the last visit, she preferred to avoid them at that time, but wants to start it now due to persistent/worsening symptoms Started gabapentin  100 mg nightly-plan to increase dose as tolerated

## 2024-04-15 NOTE — Patient Instructions (Signed)
 Please start taking gabapentin  as prescribed.  Okay to take vitamin B12 - 500 mcg once daily, can help with numbness and tingling as well.

## 2024-04-15 NOTE — Progress Notes (Signed)
 Virtual Visit via Video Note   Because of Adrienne [name]'s co-morbid illnesses, she is at least at moderate risk for complications without adequate follow up.  This format is felt to be most appropriate for this patient at this time.  All issues noted in this document were discussed and addressed.  A limited physical exam was performed with this format.      Evaluation Performed:  Follow-up visit  Date:  04/15/2024   ID:  Adrienne, [name] [birth year], MRN [id number]  Patient Location: Home Provider Location: Office/Clinic  Participants: Patient Location of Patient: Home Location of Provider: Telehealth Consent was obtain for visit to be over via telehealth. I verified that I am speaking with the correct person using two identifiers.  PCP:  [name], MD   Chief Complaint: Neuropathic pain  History of Present Illness:    Adrienne [name] is a 78 y.o. female with PMH of HTN, type 2 DM, nonishcemic CM, hypothyroidism, OA of hip and knee, HLD, breast ca. s/p right mastectomy who has a video visit for neuropathic pain.  She reports bilateral hand and feet burning sensation since chemotherapy for her pancreatic carcinoma.  She continues to have neuropathic symptoms despite completing chemotherapy in 08/25.  She had to stop her chemotherapy due to neuropathic symptoms.  The patient does not have symptoms concerning for COVID-19 infection (fever, chills, cough, or new shortness of breath).   Past Medical, Surgical, Social History, Allergies, and Medications have been Reviewed.  Past Medical History:  Diagnosis Date   Allergy    Ambulates with cane    straight cane   Arthritis    knee, back   CHF (congestive heart failure) (HCC)    Ductal carcinoma in situ of breast 12/2018   R Breast-mastectomy only   Gout    History of blood transfusion 1986   w/ Hysterectomy surgery   Hyperlipidemia    diet controlled - no meds   Hypertension     Hypothyroidism    Pelvic kidney    lower right pelvic kidney    SBO (small bowel obstruction) (HCC) 10/2016   surgery    Sleep apnea    Mild - no mask needed per sleep study   Smoker    quit smoking 2018   Type 2 diabetes mellitus (HCC)    Past Surgical History:  Procedure Laterality Date   ABDOMINAL HYSTERECTOMY  1986   COMPLETE-precancerous   APPENDECTOMY     pt states it was removed when gallbladder was removed.   AUGMENTATION MAMMAPLASTY     BACK SURGERY     lower back   BILIARY BRUSHING  07/08/2023   Procedure: BILIARY BRUSHING;  Surgeon: Wilhelmenia Aloha Raddle., MD;  Location: [facility];  Service: Gastroenterology;;   BILIARY STENT PLACEMENT N/A 07/08/2023   Procedure: BILIARY STENT PLACEMENT;  Surgeon: Wilhelmenia Aloha Raddle., MD;  Location: [facility];  Service: Gastroenterology;  Laterality: N/A;   BIOPSY  06/14/2023   Procedure: BIOPSY;  Surgeon: Avram Lupita BRAVO, MD;  Location: [facility];  Service: Gastroenterology;;   BIOPSY  07/08/2023   Procedure: BIOPSY;  Surgeon: Wilhelmenia Aloha Raddle., MD;  Location: [facility];  Service: Gastroenterology;;   BREAST BIOPSY Right 05/12/2019   times 2   BREAST LUMPECTOMY WITH RADIOACTIVE SEED LOCALIZATION Right 03/18/2019   Procedure: RIGHT BREAST LUMPECTOMY WITH RADIOACTIVE SEED LOCALIZATION;  Surgeon: [name], MD;  Location: [facility];  Service: General;  Laterality: Right;   CHOLECYSTECTOMY  COLONOSCOPY  02/15/2008   Kaplan    COLONOSCOPY WITH PROPOFOL   02/27/2018   Dr.Nandigam   ECTOPIC PREGNANCY SURGERY     ERCP N/A 06/14/2023   Procedure: ENDOSCOPIC RETROGRADE CHOLANGIOPANCREATOGRAPHY (ERCP);  Surgeon: Avram Lupita BRAVO, MD;  Location: [facility];  Service: Gastroenterology;  Laterality: N/A;   ERCP N/A 06/27/2023   Procedure: ENDOSCOPIC RETROGRADE CHOLANGIOPANCREATOGRAPHY (ERCP);  Surgeon: Charlanne Groom, MD;  Location: [facility];  Service: Gastroenterology;  Laterality: N/A;   ERCP N/A 07/08/2023    Procedure: ENDOSCOPIC RETROGRADE CHOLANGIOPANCREATOGRAPHY (ERCP);  Surgeon: Wilhelmenia Aloha Raddle., MD;  Location: [facility];  Service: Gastroenterology;  Laterality: N/A;   ESOPHAGOGASTRODUODENOSCOPY N/A 07/08/2023   Procedure: ESOPHAGOGASTRODUODENOSCOPY (EGD);  Surgeon: Wilhelmenia Aloha Raddle., MD;  Location: [facility];  Service: Gastroenterology;  Laterality: N/A;   ESOPHAGOGASTRODUODENOSCOPY (EGD) WITH PROPOFOL  N/A 06/14/2023   Procedure: ESOPHAGOGASTRODUODENOSCOPY (EGD) WITH PROPOFOL ;  Surgeon: Avram Lupita BRAVO, MD;  Location: [facility];  Service: Gastroenterology;  Laterality: N/A;   EUS N/A 07/08/2023   Procedure: UPPER ENDOSCOPIC ULTRASOUND (EUS) LINEAR;  Surgeon: Wilhelmenia Aloha Raddle., MD;  Location: [facility];  Service: Gastroenterology;  Laterality: N/A;   FINE NEEDLE ASPIRATION N/A 07/08/2023   Procedure: FINE NEEDLE ASPIRATION (FNA) LINEAR;  Surgeon: Wilhelmenia Aloha Raddle., MD;  Location: [facility];  Service: Gastroenterology;  Laterality: N/A;   IR IMAGING GUIDED PORT INSERTION  07/29/2023   JOINT REPLACEMENT     Left hip total Dr. Melodi 10-01-17   MALONEY DILATION  06/14/2023   Procedure: AGAPITO DILATION;  Surgeon: Avram Lupita BRAVO, MD;  Location: [facility];  Service: Gastroenterology;;   MASTECTOMY Right 07/26/2019   PANCREATIC STENT PLACEMENT  06/14/2023   Procedure: PANCREATIC STENT PLACEMENT;  Surgeon: Avram Lupita BRAVO, MD;  Location: [facility];  Service: Gastroenterology;;   PANCREATIC STENT PLACEMENT  06/27/2023   Procedure: PANCREATIC STENT PLACEMENT;  Surgeon: Charlanne Groom, MD;  Location: [facility];  Service: Gastroenterology;;   POLYPECTOMY     polypectomy-oropharynx     RE-EXCISION OF BREAST CANCER,SUPERIOR MARGINS Right 04/08/2019   Procedure: RE-EXCISION OF RIGHT BREAST ANTERIOR MARGINS;  Surgeon: Curvin Mt III, MD;  Location: [facility];  Service: General;  Laterality: Right;   right knee meniscus     SBO  10/2016   small bowel obstruction    SIMPLE MASTECTOMY WITH AXILLARY SENTINEL NODE BIOPSY Right 07/26/2019   Procedure: RIGHT MASTECTOMY WITH SENTINEL NODE BIOPSY;  Surgeon: Curvin Mt MOULD, MD;  Location: [facility];  Service: General;  Laterality: Right;   SPHINCTEROTOMY  06/27/2023   Procedure: SPHINCTEROTOMY;  Surgeon: Charlanne Groom, MD;  Location: [facility];  Service: Gastroenterology;;   ANNETT  07/08/2023   Procedure: ANNETT;  Surgeon: Wilhelmenia Aloha Raddle., MD;  Location: [facility];  Service: Gastroenterology;;   TOTAL HIP ARTHROPLASTY Left 10/01/2017   Procedure: LEFT  TOTAL HIP ARTHROPLASTY ANTERIOR APPROACH;  Surgeon: Melodi Lerner, MD;  Location: [facility];  Service: Orthopedics;  Laterality: Left;   TOTAL HIP ARTHROPLASTY Right 03/11/2018   Procedure: RIGHT TOTAL HIP ARTHROPLASTY ANTERIOR APPROACH;  Surgeon: Melodi Lerner, MD;  Location: [facility];  Service: Orthopedics;  Laterality: Right;    UPPER GASTROINTESTINAL ENDOSCOPY       Current Meds  Medication Sig   albuterol  (VENTOLIN  HFA) 108 (90 Base) MCG/ACT inhaler Inhale 2 puffs into the lungs every 6 (six) hours as needed for wheezing or shortness of breath.   cetirizine  (ZYRTEC  ALLERGY) 10 MG tablet Take 1 tablet (10 mg total) by mouth daily.   feeding  supplement (ENSURE PLUS HIGH PROTEIN) LIQD Take 237 mLs by mouth 2 (two) times daily between meals.   fluticasone  (FLONASE ) 50 MCG/ACT nasal spray Place 2 sprays into both nostrils daily.   Fluticasone -Umeclidin-Vilant (TRELEGY ELLIPTA ) 100-62.5-25 MCG/ACT AEPB Inhale 1 Inhalation into the lungs daily in the afternoon.   furosemide  (LASIX ) 20 MG tablet Take 1 tablet (20 mg total) by mouth daily.   Glucose Blood (BLOOD GLUCOSE TEST STRIPS) STRP 1 each by In Vitro route in the morning, at noon, and at bedtime. May substitute to any manufacturer covered by patient's insurance.   guaiFENesin  (ROBITUSSIN) 100 MG/5ML liquid Take 5 mLs by mouth every 4 (four) hours as needed for cough  or to loosen phlegm.   insulin  glargine (LANTUS ) 100 UNIT/ML injection Inject 10 Units into the skin at bedtime.   lidocaine -prilocaine  (EMLA ) cream Apply 1 Application topically as needed (port).   losartan  (COZAAR ) 100 MG tablet Take 0.5 tablets (50 mg total) by mouth daily.   NIFEdipine  (ADALAT  CC) 30 MG 24 hr tablet Take 1 tablet (30 mg total) by mouth daily.   predniSONE  (DELTASONE ) 10 MG tablet Prednisone  40 mg p.o. once daily for 3 days, then 30 mg p.o. once daily for 3 days, then 20 mg p.o. once daily for 3 days, then 10 mg p.o. once daily for 3 days and stop.   RELION PEN NEEDLES 31G X 6 MM MISC USE 1  THREE TIMES DAILY   senna-docusate (SENOKOT-S) 8.6-50 MG tablet Take 1 tablet by mouth at bedtime as needed for mild constipation.   thyroid  (NP THYROID ) 90 MG tablet Take 1 tablet (90 mg total) by mouth daily.     Allergies:   Lisinopril , Oxaliplatin , Statins, and Atorvastatin    ROS:   Please see the history of present illness. All other systems reviewed and are negative.   Labs/Other Tests and Data Reviewed:    Recent Labs: 02/24/2024: TSH 9.380 02/26/2024: Pro Brain Natriuretic Peptide 1,015.0 02/27/2024: Magnesium  1.7 04/05/2024: ALT 15; BUN 19; Creatinine, Ser 0.78; Hemoglobin 10.5; Platelets 304; Potassium 3.8; Sodium 143   Recent Lipid Panel Lab Results  Component Value Date/Time   CHOL 219 (H) 10/10/2022 10:44 AM   TRIG 118 10/10/2022 10:44 AM   HDL 53 10/10/2022 10:44 AM   CHOLHDL 4.1 10/10/2022 10:44 AM   CHOLHDL 4.8 12/05/2020 10:27 AM   LDLCALC 145 (H) 10/10/2022 10:44 AM   LDLCALC 146 (H) 12/05/2020 10:27 AM    Wt Readings from Last 3 Encounters:  04/12/24 143 lb 8 oz (65.1 kg)  03/31/24 140 lb (63.5 kg)  03/17/24 144 lb 12.8 oz (65.7 kg)     Objective:    Vital Signs:  There were no vitals taken for this visit.   VITAL SIGNS:  reviewed GEN:  no acute distress EYES:  sclerae anicteric, EOMI - Extraocular Movements Intact RESPIRATORY:  normal  respiratory effort, symmetric expansion NEURO:  alert and oriented x 3 PSYCH:  normal affect  ASSESSMENT & PLAN:    Peripheral neuropathy due to chemotherapy Burning sensation and tingling in bilateral hands and feet likely due to chemotherapy induced peripheral neuropathy Had discussed about possible treatment options in the last visit, she preferred to avoid them at that time, but wants to start it now due to persistent/worsening symptoms Started gabapentin  100 mg nightly-plan to increase dose as tolerated  I discussed the assessment and treatment plan with the patient. The patient was provided an opportunity to ask questions, and all were answered. The patient agreed with  the plan and demonstrated an understanding of the instructions.   The patient was advised to call back or seek an in-person evaluation if the symptoms worsen or if the condition fails to improve as anticipated.  The above assessment and management plan was discussed with the patient. The patient verbalized understanding of and has agreed to the management plan.   Medication Adjustments/Labs and Tests Ordered: Current medicines are reviewed at length with the patient today.  Concerns regarding medicines are outlined above.   Tests Ordered: No orders of the defined types were placed in this encounter.   Medication Changes: No orders of the defined types were placed in this encounter.    Note: This dictation was prepared with Dragon dictation along with smaller phrase technology. Similar sounding words can be transcribed inadequately or may not be corrected upon review. Any transcriptional errors that result from this process are unintentional.      Disposition:  Follow up  Signed, Suzzane MARLA Blanch, MD  04/15/2024 12:30 PM     [facility]

## 2024-04-19 ENCOUNTER — Inpatient Hospital Stay

## 2024-04-19 ENCOUNTER — Other Ambulatory Visit: Payer: Self-pay

## 2024-04-19 DIAGNOSIS — T451X5A Adverse effect of antineoplastic and immunosuppressive drugs, initial encounter: Secondary | ICD-10-CM

## 2024-04-19 DIAGNOSIS — K8689 Other specified diseases of pancreas: Secondary | ICD-10-CM

## 2024-04-19 DIAGNOSIS — C250 Malignant neoplasm of head of pancreas: Secondary | ICD-10-CM

## 2024-04-19 DIAGNOSIS — D0511 Intraductal carcinoma in situ of right breast: Secondary | ICD-10-CM

## 2024-04-19 DIAGNOSIS — C259 Malignant neoplasm of pancreas, unspecified: Secondary | ICD-10-CM

## 2024-04-19 LAB — CBC WITH DIFFERENTIAL (CANCER CENTER ONLY)
Abs Immature Granulocytes: 0.02 K/uL (ref 0.00–0.07)
Basophils Absolute: 0 K/uL (ref 0.0–0.1)
Basophils Relative: 0 %
Eosinophils Absolute: 0.1 K/uL (ref 0.0–0.5)
Eosinophils Relative: 1 %
HCT: 33.3 % — ABNORMAL LOW (ref 36.0–46.0)
Hemoglobin: 10.7 g/dL — ABNORMAL LOW (ref 12.0–15.0)
Immature Granulocytes: 0 %
Lymphocytes Relative: 33 %
Lymphs Abs: 3.2 K/uL (ref 0.7–4.0)
MCH: 30.6 pg (ref 26.0–34.0)
MCHC: 32.1 g/dL (ref 30.0–36.0)
MCV: 95.1 fL (ref 80.0–100.0)
Monocytes Absolute: 0.5 K/uL (ref 0.1–1.0)
Monocytes Relative: 5 %
Neutro Abs: 6 K/uL (ref 1.7–7.7)
Neutrophils Relative %: 61 %
Platelet Count: 215 K/uL (ref 150–400)
RBC: 3.5 MIL/uL — ABNORMAL LOW (ref 3.87–5.11)
RDW: 15.4 % (ref 11.5–15.5)
WBC Count: 9.8 K/uL (ref 4.0–10.5)
nRBC: 0 % (ref 0.0–0.2)

## 2024-04-19 LAB — COMPREHENSIVE METABOLIC PANEL WITH GFR
ALT: 11 U/L (ref 0–44)
AST: 20 U/L (ref 15–41)
Albumin: 3.9 g/dL (ref 3.5–5.0)
Alkaline Phosphatase: 93 U/L (ref 38–126)
Anion gap: 7 (ref 5–15)
BUN: 26 mg/dL — ABNORMAL HIGH (ref 8–23)
CO2: 27 mmol/L (ref 22–32)
Calcium: 9.5 mg/dL (ref 8.9–10.3)
Chloride: 105 mmol/L (ref 98–111)
Creatinine, Ser: 1.09 mg/dL — ABNORMAL HIGH (ref 0.44–1.00)
GFR, Estimated: 53 mL/min — ABNORMAL LOW (ref >60)
Glucose, Bld: 102 mg/dL — ABNORMAL HIGH (ref 70–99)
Potassium: 3.6 mmol/L (ref 3.5–5.1)
Sodium: 139 mmol/L (ref 135–145)
Total Bilirubin: 0.3 mg/dL (ref 0.0–1.2)
Total Protein: 7.6 g/dL (ref 6.5–8.1)

## 2024-04-19 NOTE — Addendum Note (Signed)
 Addended by: MARGRETTE CAMELIA HERO on: 04/19/2024 11:08 AM   Modules accepted: Orders

## 2024-04-19 NOTE — Addendum Note (Signed)
 Encounter addended by: Irven Gauze E, [place] on: 04/19/2024 10:14 PM  Actions taken: Imaging Exam ended

## 2024-05-03 ENCOUNTER — Ambulatory Visit (HOSPITAL_COMMUNITY)
Admission: RE | Admit: 2024-05-03 | Discharge: 2024-05-03 | Disposition: A | Discharge status: Home / Self Care | Source: Ambulatory Visit | Attending: Hematology | Admitting: Hematology

## 2024-05-03 DIAGNOSIS — K8689 Other specified diseases of pancreas: Secondary | ICD-10-CM | POA: Diagnosis not present

## 2024-05-03 DIAGNOSIS — C250 Malignant neoplasm of head of pancreas: Secondary | ICD-10-CM | POA: Insufficient documentation

## 2024-05-03 MED ORDER — HEPARIN SOD (PORK) LOCK FLUSH 100 UNIT/ML IV SOLN
500.0000 [IU] | Freq: Once | INTRAVENOUS | Status: AC
  Administered 2024-05-03: 500 [IU] via INTRAVENOUS

## 2024-05-03 MED ORDER — IOHEXOL 300 MG/ML  SOLN
100.0000 mL | Freq: Once | INTRAMUSCULAR | Status: AC | PRN
  Administered 2024-05-03: 100 mL via INTRAVENOUS

## 2024-05-03 MED ORDER — HEPARIN SOD (PORK) LOCK FLUSH 100 UNIT/ML IV SOLN
INTRAVENOUS | Status: AC
  Filled 2024-05-03: qty 5

## 2024-05-03 MED ORDER — SODIUM CHLORIDE (PF) 0.9 % IJ SOLN
INTRAMUSCULAR | Status: AC
  Filled 2024-05-03: qty 50

## 2024-05-04 ENCOUNTER — Encounter (HOSPITAL_COMMUNITY): Payer: Self-pay | Admitting: Gastroenterology

## 2024-05-04 NOTE — Progress Notes (Signed)
 Pre op call Rock Gearing    PCPGLENWOOD Blanch MD Cardiologist- Hochrien MD Pulmonologist-n/a  EKG-03/01/24 Echo-09/12/22 Cath-n/a [id number] ICD/PM-n/a GLP1-n/a Blood Thinner-n/a  History: CHF,HTN,OSA,DM,BRCA,Pancreatic Cancer. Patient last saw her cardiologist in August and they discussed her bradycardia. They wanted to do an echo and says can do if needed before surgery and recommended f/u 6 months. I asked her what surgery they were referring to and she said [facility] was considering Pancreatic surgery but she isnt going to do that anymore. Patient denies any heart issues, last healthcare note HR 60.  Anesthesia Review- Yes- approved to proceed

## 2024-05-05 ENCOUNTER — Other Ambulatory Visit: Payer: Self-pay | Admitting: Internal Medicine

## 2024-05-05 DIAGNOSIS — E039 Hypothyroidism, unspecified: Secondary | ICD-10-CM

## 2024-05-05 NOTE — Assessment & Plan Note (Signed)
 cT2N0M0, stage IB -Patient presented with obstructive jaundice, CT and MRI showed a 2.5 cm mass in the head of the pancreas, with pancreatic duct and biliary duct dilatation.  ERCP was attempted twice but failed due to deformed duodenum -She underwent ERCP and stent placement, EUS with fine-needle biopsy of the pancreatic mass by Dr. Wilhelmenia on July 08, 2023.  Biopsy confirmed adenocarcinoma. -Due to the tumor invasion to portal vein and SMV, I recommended neoadjuvant chemotherapy FOLFIRINOX, she started on 08/05/2023 -restaging CT 11/05/2023 showed stable disease  - Patient was seen by pancreatobiliary surgeon Dr. Dasie, who also consulted her colleagues at [facility], and they recommend a total of 6 months neoadjuvant chemotherapy.  Will change her chemo to second line gemcitabine  and Abraxane  for additional 3 months. She started on 12/02/2023 and completed on 02/09/2024 - Her case was reviewed by [facility] pancreatobiliary surgeons, surgery was not recommended.  We recommend consolidation radiation.  Patient was seen by Dr. Dewey, she is a candidate for SBRT, but fiducial placement will not be available until November 2025

## 2024-05-06 ENCOUNTER — Inpatient Hospital Stay: Attending: Nurse Practitioner | Admitting: Hematology

## 2024-05-06 ENCOUNTER — Telehealth: Payer: Self-pay | Admitting: Radiation Oncology

## 2024-05-06 ENCOUNTER — Other Ambulatory Visit: Payer: Self-pay | Admitting: Hematology

## 2024-05-06 DIAGNOSIS — C250 Malignant neoplasm of head of pancreas: Secondary | ICD-10-CM | POA: Diagnosis not present

## 2024-05-06 NOTE — Telephone Encounter (Signed)
 She is scheduled with me next week. FYI.

## 2024-05-06 NOTE — Telephone Encounter (Signed)
 I spoke with the patient to let her know Dr. Dewey has reviewed her CT from 05/03/24 and she is still a candidate for SBRT in 5 fractions. She has biliary drain placement and fiducial marker placement with Dr. Wilhelmenia on 05/10/24. She is open to proceeding with simulation thereafter and subsequent radiation. The patient will be contacted to coordinate treatment planning by our simulation department.      Donald KYM Husband, PAC

## 2024-05-07 ENCOUNTER — Encounter: Payer: Self-pay | Admitting: Cardiology

## 2024-05-07 ENCOUNTER — Encounter: Payer: Self-pay | Admitting: Hematology

## 2024-05-07 NOTE — Progress Notes (Signed)
 [facility], [facility]   Telephone:([phone] Fax:([phone]) [phone]   Clinic Follow up Note   Patient Care Team: [name], MD as PCP - General (Internal Medicine) [name], MD as PCP - Cardiology (Cardiology) [name], RN as Oncology Nurse Navigator [name], MD as Consulting Physician (General Surgery) [name], MD as Consulting Physician (Hematology) [name], MD as Attending Physician (Radiation Oncology) Nicholaus Sherlean CROME, [facility] (Inactive) as Pharmacist (Pharmacist) 05/07/2024  I connected with Adrienne [name] on 05/06/2024 at  8:40 AM EST by telephone and verified that I am speaking with the correct person using two identifiers.   I discussed the limitations, risks, security and privacy concerns of performing an evaluation and management service by telephone and the availability of in person appointments. I also discussed with the patient that there may be a patient responsible charge related to this service. The patient expressed understanding and agreed to proceed.   Patient's location:  Home  Provider's location:  Office    CHIEF COMPLAINT: review CT results    CURRENT THERAPY: Pending pancreatic radiation therapy  Oncology history Malignant neoplasm of head of pancreas (HCC) cT2N0M0, stage IB -Patient presented with obstructive jaundice, CT and MRI showed a 2.5 cm mass in the head of the pancreas, with pancreatic duct and biliary duct dilatation.  ERCP was attempted twice but failed due to deformed duodenum -She underwent ERCP and stent placement, EUS with fine-needle biopsy of the pancreatic mass by Dr. Wilhelmenia on July 08, 2023.  Biopsy confirmed adenocarcinoma. -Due to the tumor invasion to portal vein and SMV, I recommended neoadjuvant chemotherapy FOLFIRINOX, she started on 08/05/2023 -restaging CT 11/05/2023 showed stable disease  - Patient was seen by pancreatobiliary surgeon Dr. Dasie, who also consulted her  colleagues at [facility], and they recommend a total of 6 months neoadjuvant chemotherapy.  Will change her chemo to second line gemcitabine  and Abraxane  for additional 3 months. She started on 12/02/2023 and completed on 02/09/2024 - Her case was reviewed by [facility] pancreatobiliary surgeons, surgery was not recommended.  We recommend consolidation radiation.  Patient was seen by Dr. Dewey, she is a candidate for SBRT, but fiducial placement will not be available until November 2025  Assessment & Plan Malignant neoplasm of head of pancreas, stable, planned for radiation The pancreatic cancer is stable with no evidence of metastasis on recent CT scan.  I personally reviewed the CT scan images and discussed the findings with patient and her daughter in detail.  She is asymptomatic with no gastrointestinal issues and is eating well. - Proceed with fiducial placement on November 17th. - Proceed with radiation therapy as planned. - Will schedule follow-up appointment in early January. - Will order CT scan every three months post-radiation. - Advised to call the office if any new symptoms or discomfort arise.  Plan - He is scheduled for fiducial placement next week and plan to start radiation after that - CT scan reviewed, no evidence of disease progression or metastasis - Follow-up in 2 months, order restaging CT scan on next visit.    SUMMARY OF ONCOLOGIC HISTORY: Oncology History  Ductal carcinoma in situ (DCIS) of right breast  01/28/2019 Mammogram   Diagnostic Mammogram 01/28/19  IMPRESSION: Right breast retroareolar 7 mm grouped pleomorphic calcifications are suspicious.   02/02/2019 Initial Biopsy   Diagnosis 02/02/19 Breast, right, needle core biopsy, outer retroareolar - DUCTAL CARCINOMA IN SITU WITH CALCIFICATIONS. Microscopic Comment The ductal carcinoma in situ is intermediate grade. Estrogen and  progesterone receptors will be performed.  Results: IMMUNOHISTOCHEMICAL AND MORPHOMETRIC  ANALYSIS PERFORMED MANUALLY Estrogen Receptor: 90%, POSITIVE, STRONG STAINING INTENSITY Progesterone Receptor: 20%, POSITIVE, STRONG STAINING INTENSITY    02/02/2019 Cancer Staging   Staging form: Breast, AJCC 8th Edition - Clinical stage from 02/02/2019: Stage 0 (cTis (DCIS), cN0, cM0, G2, ER+, PR+, HER2: Not Assessed) - Signed by [name], MD on 02/10/2019   02/04/2019 Initial Diagnosis   Ductal carcinoma in situ (DCIS) of right breast   03/18/2019 Surgery   RIGHT BREAST LUMPECTOMY WITH RADIOACTIVE SEED LOCALIZATION by Dr. Curvin 03/18/19   03/18/2019 Pathology Results   FINAL MICROSCOPIC DIAGNOSIS: 03/18/19 A. BREAST, RIGHT, LUMPECTOMY:  - Ductal carcinoma in situ with calcifications and necrosis, 1.4 cm.  - Ductal carcinoma in situ focally 0.1 cm from medial and anterior  lumpectomy margins.  - Ductal carcinoma in situ involves the final anterior margin (See part  D).  - Ductal carcinoma in situ focally 0.3 cm from superior lumpectomy  margin.  - Biopsy site and biopsy clip.   B. BREAST, RIGHT LATERAL MARGIN, EXCISION:  - Benign breast tissue.  - No ductal carcinoma in situ.  - Final lateral margin greater than 1 cm.   C. BREAST, RIGHT MEDIAL MARGIN, EXCISION:  - Ductal carcinoma in situ, 0.3 cm.  - Ductal carcinoma in situ with focally 0.3 cm from final medial margin.   D. BREAST, RIGHT ANTERIOR MARGIN, EXCISION:  - Ductal carcinoma in situ, 1.5 cm.  - Ductal carcinoma in situ focally involves final anterior margin.   E. BREAST, RIGHT DEEP MARGIN, EXCISION:  - Fibrocystic changes.  - No ductal carcinoma in situ.  - Final posterior margin greater than 0.7 cm.    04/08/2019 Surgery   RE-EXCISION OF RIGHT BREAST ANTERIOR MARGINS by Dr Curvin 04/08/19   04/08/2019 Pathology Results    DIAGNOSIS: 04/08/19  A. BREAST, RIGHT, ANTERIOR SUPERIOR, EXCISION:  - Intermediate grade ductal carcinoma in situ with necrosis.  - In situ carcinoma is <1 mm (not on ink) from the new  anterior superior  margin, multifocally.  - Intraductal papilloma.  - Fibrocystic change.   B. BREAST, RIGHT, ANTERIOR INFERIOR, EXCISION:  - Benign breast tissue with resection changes.   C. BREAST, RIGHT, MEDIAL, EXCISION:  - Benign breast tissue with resection changes.  - Fibroadenomatoid and fibrocystic changes.   05/06/2019 Mammogram   Right Mammogram 05/06/19 IMPRESSION: Suspicious finding with 2 groups of microcalcifications in the medial right breast. The anterior group measures 2 x 2 x 2 mm. The more posterior group measures 4 x 3 x 4 mm.   05/12/2019 Pathology Results   Diagnosis 05/12/19 1. Breast, right, needle core biopsy, medial - DUCTAL CARCINOMA IN SITU WITH CALCIFICATIONS. - SEE MICROSCOPIC DESCRIPTION. 2. Breast, right, needle core biopsy, medial - DUCTAL CARCINOMA IN SITU WITH CALCIFICATIONS. - SEE MICROSCOPIC DESCRIPTION. Results: IMMUNOHISTOCHEMICAL AND MORPHOMETRIC ANALYSIS PERFORMED MANUALLY Estrogen Receptor: 100%, POSITIVE, STRONG STAINING INTENSITY Progesterone Receptor: 0%, NEGATIVE COMMENT: The negative hormone receptor study(ies) in this case has an internal positive control. Results: IMMUNOHISTOCHEMICAL AND MORPHOMETRIC ANALYSIS PERFORMED MANUALLY Estrogen Receptor: 100%, POSITIVE, STRONG STAINING INTENSITY Progesterone Receptor: 0%, NEGATIVE COMMENT: The negative hormone receptor study(ies) in this case has an internal positive control.   07/26/2019 Surgery   RIGHT MASTECTOMY WITH SENTINEL NODE BIOPSY by Dr. Curvin 07/26/19   07/26/2019 Pathology Results   FINAL MICROSCOPIC DIAGNOSIS: 07/26/19  A. LYMPH NODE, RIGHT #1, SENTINEL,  BIOPSY:  -  No carcinoma identified in one lymph node (0/1)  B. BREAST, RIGHT, MASTECTOMY:  -  Ductal carcinoma in situ, intermediate grade, 0.6 cm  -  Margins uninvolved by carcinoma (greater than 1 cm; posterior margin)  -  Previous biopsy site changes (x2)  -  See oncology table below   07/26/2019 Cancer Staging    Staging form: Breast, AJCC 8th Edition - Pathologic stage from 07/26/2019: Stage 0 (pTis (DCIS), pN0, cM0, G2, ER+, PR+, HER2: Not Assessed) - Signed by [name], MD on 08/10/2019   08/15/2023 Genetic Testing   Negative [facility] CancerNext-Expanded +RNAinsight Panel.  VUS in ATM at c.4910-891G>A and SDHA at c.-1C>G.  Report date is 08/15/2023.   The CancerNext-Expanded gene panel offered by [facility] and includes sequencing, rearrangement, and RNA analysis for the following 76 genes: AIP, ALK, APC, ATM, AXIN2, BAP1, BARD1, BMPR1A, BRCA1, BRCA2, BRIP1, CDC73, CDH1, CDK4, CDKN1B, CDKN2A, CEBPA, CHEK2, CTNNA1, DDX41, DICER1, ETV6, FH, FLCN, GATA2, LZTR1, MAX, MBD4, MEN1, MET, MLH1, MSH2, MSH3, MSH6, MUTYH, NF1, NF2, NTHL1, PALB2, PHOX2B, PMS2, POT1, PRKAR1A, PTCH1, PTEN, RAD51C, RAD51D, RB1, RET, RUNX1, SDHA, SDHAF2, SDHB, SDHC, SDHD, SMAD4, SMARCA4, SMARCB1, SMARCE1, STK11, SUFU, TMEM127, TP53, TSC1, TSC2, VHL, and WT1 (sequencing and deletion/duplication); EGFR, HOXB13, KIT, MITF, PDGFRA, POLD1, and POLE (sequencing only); EPCAM and GREM1 (deletion/duplication only).    Malignant neoplasm of head of pancreas (HCC)  07/07/2022 Cancer Staging   Staging form: Exocrine Pancreas, AJCC 8th Edition - Clinical stage from 07/07/2022: Stage IB (cT2, cN0, cM0) - Signed by [name], MD on 07/22/2023 Stage prefix: Initial diagnosis Total positive nodes: 0   06/13/2023 Initial Diagnosis   Pancreatic adenocarcinoma (HCC)   08/06/2023 - 11/13/2023 Chemotherapy   Patient is on Treatment Plan : PANCREAS Modified FOLFIRINOX q14d x 4 cycles     08/15/2023 Genetic Testing   Negative [facility] CancerNext-Expanded +RNAinsight Panel.  VUS in ATM at c.4910-891G>A and SDHA at c.-1C>G.  Report date is 08/15/2023.   The CancerNext-Expanded gene panel offered by [facility] and includes sequencing, rearrangement, and RNA analysis for the following 76 genes: AIP, ALK, APC, ATM, AXIN2, BAP1, BARD1, BMPR1A, BRCA1, BRCA2, BRIP1,  CDC73, CDH1, CDK4, CDKN1B, CDKN2A, CEBPA, CHEK2, CTNNA1, DDX41, DICER1, ETV6, FH, FLCN, GATA2, LZTR1, MAX, MBD4, MEN1, MET, MLH1, MSH2, MSH3, MSH6, MUTYH, NF1, NF2, NTHL1, PALB2, PHOX2B, PMS2, POT1, PRKAR1A, PTCH1, PTEN, RAD51C, RAD51D, RB1, RET, RUNX1, SDHA, SDHAF2, SDHB, SDHC, SDHD, SMAD4, SMARCA4, SMARCB1, SMARCE1, STK11, SUFU, TMEM127, TP53, TSC1, TSC2, VHL, and WT1 (sequencing and deletion/duplication); EGFR, HOXB13, KIT, MITF, PDGFRA, POLD1, and POLE (sequencing only); EPCAM and GREM1 (deletion/duplication only).    12/02/2023 -  Chemotherapy   Patient is on Treatment Plan : PANCREATIC Abraxane  D1,8 + Gemcitabine  D1,8 q21d       Discussed the use of AI scribe software for clinical note transcription with the patient, who gave verbal consent to proceed.  History of Present Illness Adrienne [name] is a 78 year old female with pancreatic cancer who presents for follow-up. She is accompanied by her daughter, Luke.  She underwent a CT scan earlier this week. She has no new symptoms since her last visit three to four weeks ago. She has no gastrointestinal issues and maintains a good appetite.     REVIEW OF SYSTEMS:   Constitutional: Denies fevers, chills or abnormal weight loss Eyes: Denies blurriness of vision Ears, nose, mouth, throat, and face: Denies mucositis or sore throat Respiratory: Denies cough, dyspnea or wheezes Cardiovascular: Denies palpitation, chest discomfort or lower extremity swelling Gastrointestinal:  Denies nausea, heartburn or change in  bowel habits Skin: Denies abnormal skin rashes Lymphatics: Denies new lymphadenopathy or easy bruising Neurological:Denies numbness, tingling or new weaknesses Behavioral/Psych: Mood is stable, no new changes  All other systems were reviewed with the patient and are negative.  MEDICAL HISTORY:  Past Medical History:  Diagnosis Date   Allergy    Ambulates with cane    straight cane   Arthritis    knee, back   CHF  (congestive heart failure) (HCC)    Ductal carcinoma in situ of breast 12/2018   R Breast-mastectomy only   Gout    History of blood transfusion 1986   w/ Hysterectomy surgery   Hyperlipidemia    diet controlled - no meds   Hypertension    Hypothyroidism    Pelvic kidney    lower right pelvic kidney    SBO (small bowel obstruction) (HCC) 10/2016   surgery    Sleep apnea    Mild - no mask needed per sleep study   Smoker    quit smoking 2018   Type 2 diabetes mellitus (HCC)     SURGICAL HISTORY: Past Surgical History:  Procedure Laterality Date   ABDOMINAL HYSTERECTOMY  1986   COMPLETE-precancerous   APPENDECTOMY     pt states it was removed when gallbladder was removed.   AUGMENTATION MAMMAPLASTY     BACK SURGERY     lower back   BILIARY BRUSHING  07/08/2023   Procedure: BILIARY BRUSHING;  Surgeon: Wilhelmenia Aloha Raddle., MD;  Location: [facility];  Service: Gastroenterology;;   BILIARY STENT PLACEMENT N/A 07/08/2023   Procedure: BILIARY STENT PLACEMENT;  Surgeon: Wilhelmenia Aloha Raddle., MD;  Location: [facility];  Service: Gastroenterology;  Laterality: N/A;   BIOPSY  06/14/2023   Procedure: BIOPSY;  Surgeon: Avram Lupita BRAVO, MD;  Location: [facility];  Service: Gastroenterology;;   BIOPSY  07/08/2023   Procedure: BIOPSY;  Surgeon: Wilhelmenia Aloha Raddle., MD;  Location: [facility];  Service: Gastroenterology;;   BREAST BIOPSY Right 05/12/2019   times 2   BREAST LUMPECTOMY WITH RADIOACTIVE SEED LOCALIZATION Right 03/18/2019   Procedure: RIGHT BREAST LUMPECTOMY WITH RADIOACTIVE SEED LOCALIZATION;  Surgeon: [name], MD;  Location: [facility];  Service: General;  Laterality: Right;   CHOLECYSTECTOMY     COLONOSCOPY  02/15/2008   Kaplan    COLONOSCOPY WITH PROPOFOL   02/27/2018   Dr.Nandigam   ECTOPIC PREGNANCY SURGERY     ERCP N/A 06/14/2023   Procedure: ENDOSCOPIC RETROGRADE CHOLANGIOPANCREATOGRAPHY (ERCP);  Surgeon: Avram Lupita BRAVO, MD;  Location: [facility];  Service: Gastroenterology;  Laterality: N/A;   ERCP N/A 06/27/2023   Procedure: ENDOSCOPIC RETROGRADE CHOLANGIOPANCREATOGRAPHY (ERCP);  Surgeon: Charlanne Groom, MD;  Location: [facility];  Service: Gastroenterology;  Laterality: N/A;   ERCP N/A 07/08/2023   Procedure: ENDOSCOPIC RETROGRADE CHOLANGIOPANCREATOGRAPHY (ERCP);  Surgeon: Wilhelmenia Aloha Raddle., MD;  Location: [facility];  Service: Gastroenterology;  Laterality: N/A;   ESOPHAGOGASTRODUODENOSCOPY N/A 07/08/2023   Procedure: ESOPHAGOGASTRODUODENOSCOPY (EGD);  Surgeon: Wilhelmenia Aloha Raddle., MD;  Location: [facility];  Service: Gastroenterology;  Laterality: N/A;   ESOPHAGOGASTRODUODENOSCOPY (EGD) WITH PROPOFOL  N/A 06/14/2023   Procedure: ESOPHAGOGASTRODUODENOSCOPY (EGD) WITH PROPOFOL ;  Surgeon: Avram Lupita BRAVO, MD;  Location: [facility];  Service: Gastroenterology;  Laterality: N/A;   EUS N/A 07/08/2023   Procedure: UPPER ENDOSCOPIC ULTRASOUND (EUS) LINEAR;  Surgeon: Wilhelmenia Aloha Raddle., MD;  Location: [facility];  Service: Gastroenterology;  Laterality: N/A;   FINE NEEDLE ASPIRATION N/A 07/08/2023   Procedure: FINE NEEDLE ASPIRATION (FNA) LINEAR;  Surgeon: Wilhelmenia,  Aloha Raddle., MD;  Location: [facility];  Service: Gastroenterology;  Laterality: N/A;   IR IMAGING GUIDED PORT INSERTION  07/29/2023   JOINT REPLACEMENT     Left hip total Dr. Melodi 10-01-17   MALONEY DILATION  06/14/2023   Procedure: AGAPITO DILATION;  Surgeon: Avram Lupita BRAVO, MD;  Location: [facility];  Service: Gastroenterology;;   MASTECTOMY Right 07/26/2019   PANCREATIC STENT PLACEMENT  06/14/2023   Procedure: PANCREATIC STENT PLACEMENT;  Surgeon: Avram Lupita BRAVO, MD;  Location: [facility];  Service: Gastroenterology;;   PANCREATIC STENT PLACEMENT  06/27/2023   Procedure: PANCREATIC STENT PLACEMENT;  Surgeon: Charlanne Groom, MD;  Location: [facility];  Service: Gastroenterology;;   POLYPECTOMY     polypectomy-oropharynx     RE-EXCISION  OF BREAST CANCER,SUPERIOR MARGINS Right 04/08/2019   Procedure: RE-EXCISION OF RIGHT BREAST ANTERIOR MARGINS;  Surgeon: Curvin Mt III, MD;  Location: [facility];  Service: General;  Laterality: Right;   right knee meniscus     SBO  10/2016   small bowel obstruction   SIMPLE MASTECTOMY WITH AXILLARY SENTINEL NODE BIOPSY Right 07/26/2019   Procedure: RIGHT MASTECTOMY WITH SENTINEL NODE BIOPSY;  Surgeon: Curvin Mt MOULD, MD;  Location: [facility];  Service: General;  Laterality: Right;   SPHINCTEROTOMY  06/27/2023   Procedure: SPHINCTEROTOMY;  Surgeon: Charlanne Groom, MD;  Location: [facility];  Service: Gastroenterology;;   ANNETT  07/08/2023   Procedure: ANNETT;  Surgeon: Wilhelmenia Aloha Raddle., MD;  Location: [facility];  Service: Gastroenterology;;   TOTAL HIP ARTHROPLASTY Left 10/01/2017   Procedure: LEFT  TOTAL HIP ARTHROPLASTY ANTERIOR APPROACH;  Surgeon: Melodi Lerner, MD;  Location: [facility];  Service: Orthopedics;  Laterality: Left;   TOTAL HIP ARTHROPLASTY Right 03/11/2018   Procedure: RIGHT TOTAL HIP ARTHROPLASTY ANTERIOR APPROACH;  Surgeon: Melodi Lerner, MD;  Location: [facility];  Service: Orthopedics;  Laterality: Right;    UPPER GASTROINTESTINAL ENDOSCOPY      I have reviewed the social history and family history with the patient and they are unchanged from previous note.  ALLERGIES:  is allergic to lisinopril , oxaliplatin , statins, and atorvastatin .  MEDICATIONS:  Current Outpatient Medications  Medication Sig Dispense Refill   albuterol  (VENTOLIN  HFA) 108 (90 Base) MCG/ACT inhaler Inhale 2 puffs into the lungs every 6 (six) hours as needed for wheezing or shortness of breath. 8 g 2   cetirizine  (ZYRTEC  ALLERGY) 10 MG tablet Take 1 tablet (10 mg total) by mouth daily. 30 tablet 1   fluticasone  (FLONASE ) 50 MCG/ACT nasal spray Place 2 sprays into both nostrils daily. 16 g 6   Fluticasone -Umeclidin-Vilant (TRELEGY ELLIPTA ) 100-62.5-25 MCG/ACT  AEPB Inhale 1 Inhalation into the lungs daily in the afternoon. 1 each 1   furosemide  (LASIX ) 20 MG tablet Take 1 tablet (20 mg total) by mouth daily. 30 tablet 2   gabapentin  (NEURONTIN ) 100 MG capsule Take 1 capsule (100 mg total) by mouth at bedtime. 30 capsule 5   Glucose Blood (BLOOD GLUCOSE TEST STRIPS) STRP 1 each by In Vitro route in the morning, at noon, and at bedtime. May substitute to any manufacturer covered by patient's insurance. 100 strip 5   guaiFENesin  (ROBITUSSIN) 100 MG/5ML liquid Take 5 mLs by mouth every 4 (four) hours as needed for cough or to loosen phlegm. 120 mL 0   insulin  glargine (LANTUS ) 100 UNIT/ML injection Inject 10 Units into the skin at bedtime.     lidocaine -prilocaine  (EMLA ) cream Apply 1 Application topically as needed (port).  losartan  (COZAAR ) 100 MG tablet Take 0.5 tablets (50 mg total) by mouth daily.     NIFEdipine  (ADALAT  CC) 30 MG 24 hr tablet Take 1 tablet (30 mg total) by mouth daily. 30 tablet 1   predniSONE  (DELTASONE ) 10 MG tablet Prednisone  40 mg p.o. once daily for 3 days, then 30 mg p.o. once daily for 3 days, then 20 mg p.o. once daily for 3 days, then 10 mg p.o. once daily for 3 days and stop. 30 tablet 0   RELION PEN NEEDLES 31G X 6 MM MISC USE 1  THREE TIMES DAILY 100 each 0   senna-docusate (SENOKOT-S) 8.6-50 MG tablet Take 1 tablet by mouth at bedtime as needed for mild constipation. 30 tablet 0   thyroid  (ARMOUR) 90 MG tablet Take 1 tablet by mouth once daily 30 tablet 0   No current facility-administered medications for this visit.    PHYSICAL EXAMINATION: Not performed   LABORATORY DATA:  I have reviewed the data as listed    Latest Ref Rng & Units 04/19/2024   11:22 AM 04/05/2024   10:05 AM 03/17/2024   10:51 AM  CBC  WBC 4.0 - 10.5 K/uL 9.8  10.6  11.1   Hemoglobin 12.0 - 15.0 g/dL 89.2  89.4  9.6   Hematocrit 36.0 - 46.0 % 33.3  32.3  29.3   Platelets 150 - 400 K/uL 215  304  176         Latest Ref Rng & Units  04/19/2024   10:33 AM 04/05/2024   10:05 AM 03/17/2024   10:51 AM  CMP  Glucose 70 - 99 mg/dL 897  93  831   BUN 8 - 23 mg/dL 26  19  17    Creatinine 0.44 - 1.00 mg/dL 8.90  9.21  8.84   Sodium 135 - 145 mmol/L 139  143  142   Potassium 3.5 - 5.1 mmol/L 3.6  3.8  3.9   Chloride 98 - 111 mmol/L 105  110  110   CO2 22 - 32 mmol/L 27  27  25    Calcium  8.9 - 10.3 mg/dL 9.5  9.5  8.6   Total Protein 6.5 - 8.1 g/dL 7.6  7.0  6.6   Total Bilirubin 0.0 - 1.2 mg/dL 0.3  0.4  0.4   Alkaline Phos 38 - 126 U/L 93  81  86   AST 15 - 41 U/L 20  23  19    ALT 0 - 44 U/L 11  15  15        RADIOGRAPHIC STUDIES: I have personally reviewed the radiological images as listed and agreed with the findings in the report. No results found.     I discussed the assessment and treatment plan with the patient. The patient was provided an opportunity to ask questions and all were answered. The patient agreed with the plan and demonstrated an understanding of the instructions.   The patient was advised to call back or seek an in-person evaluation if the symptoms worsen or if the condition fails to improve as anticipated.  I provided 15 minutes of non face-to-face telephone visit time during this encounter, including review of chart and various tests results, discussions about plan of care and coordination of care plan.    Onita Mattock, MD 05/07/24

## 2024-05-10 ENCOUNTER — Ambulatory Visit (HOSPITAL_COMMUNITY): Admitting: Certified Registered Nurse Anesthetist

## 2024-05-10 ENCOUNTER — Ambulatory Visit (HOSPITAL_COMMUNITY)
Admission: RE | Admit: 2024-05-10 | Discharge: 2024-05-10 | Disposition: A | Discharge status: Other Acute Inpatient Hospital | Attending: Gastroenterology | Admitting: Gastroenterology

## 2024-05-10 ENCOUNTER — Emergency Department (HOSPITAL_COMMUNITY)

## 2024-05-10 ENCOUNTER — Encounter (HOSPITAL_COMMUNITY): Payer: Self-pay | Admitting: Emergency Medicine

## 2024-05-10 ENCOUNTER — Encounter (HOSPITAL_COMMUNITY)
Admission: RE | Disposition: A | Discharge status: Other Acute Inpatient Hospital | Payer: Self-pay | Source: Home / Self Care | Attending: Gastroenterology

## 2024-05-10 ENCOUNTER — Ambulatory Visit (HOSPITAL_COMMUNITY)

## 2024-05-10 ENCOUNTER — Encounter (HOSPITAL_COMMUNITY): Payer: Self-pay | Admitting: Gastroenterology

## 2024-05-10 ENCOUNTER — Emergency Department (HOSPITAL_COMMUNITY)
Admission: EM | Admit: 2024-05-10 | Discharge: 2024-05-11 | Disposition: A | Discharge status: Home / Self Care | Source: Ambulatory Visit | Attending: Emergency Medicine | Admitting: Emergency Medicine

## 2024-05-10 ENCOUNTER — Other Ambulatory Visit: Payer: Self-pay

## 2024-05-10 DIAGNOSIS — G4730 Sleep apnea, unspecified: Secondary | ICD-10-CM | POA: Insufficient documentation

## 2024-05-10 DIAGNOSIS — G620 Drug-induced polyneuropathy: Secondary | ICD-10-CM | POA: Diagnosis not present

## 2024-05-10 DIAGNOSIS — Z794 Long term (current) use of insulin: Secondary | ICD-10-CM | POA: Diagnosis not present

## 2024-05-10 DIAGNOSIS — Z800 Family history of malignant neoplasm of digestive organs: Secondary | ICD-10-CM | POA: Diagnosis not present

## 2024-05-10 DIAGNOSIS — Z87891 Personal history of nicotine dependence: Secondary | ICD-10-CM | POA: Insufficient documentation

## 2024-05-10 DIAGNOSIS — Z9889 Other specified postprocedural states: Secondary | ICD-10-CM | POA: Diagnosis not present

## 2024-05-10 DIAGNOSIS — T451X5A Adverse effect of antineoplastic and immunosuppressive drugs, initial encounter: Secondary | ICD-10-CM | POA: Insufficient documentation

## 2024-05-10 DIAGNOSIS — I509 Heart failure, unspecified: Secondary | ICD-10-CM

## 2024-05-10 DIAGNOSIS — E1151 Type 2 diabetes mellitus with diabetic peripheral angiopathy without gangrene: Secondary | ICD-10-CM | POA: Diagnosis not present

## 2024-05-10 DIAGNOSIS — T85520A Displacement of bile duct prosthesis, initial encounter: Secondary | ICD-10-CM

## 2024-05-10 DIAGNOSIS — R1011 Right upper quadrant pain: Secondary | ICD-10-CM | POA: Diagnosis present

## 2024-05-10 DIAGNOSIS — Z452 Encounter for adjustment and management of vascular access device: Secondary | ICD-10-CM | POA: Diagnosis not present

## 2024-05-10 DIAGNOSIS — I110 Hypertensive heart disease with heart failure: Secondary | ICD-10-CM

## 2024-05-10 DIAGNOSIS — Z853 Personal history of malignant neoplasm of breast: Secondary | ICD-10-CM | POA: Insufficient documentation

## 2024-05-10 DIAGNOSIS — E785 Hyperlipidemia, unspecified: Secondary | ICD-10-CM | POA: Diagnosis not present

## 2024-05-10 DIAGNOSIS — K838 Other specified diseases of biliary tract: Secondary | ICD-10-CM | POA: Diagnosis not present

## 2024-05-10 DIAGNOSIS — R079 Chest pain, unspecified: Secondary | ICD-10-CM | POA: Diagnosis not present

## 2024-05-10 DIAGNOSIS — K8689 Other specified diseases of pancreas: Secondary | ICD-10-CM

## 2024-05-10 DIAGNOSIS — Z9689 Presence of other specified functional implants: Secondary | ICD-10-CM | POA: Insufficient documentation

## 2024-05-10 DIAGNOSIS — I10 Essential (primary) hypertension: Secondary | ICD-10-CM | POA: Diagnosis not present

## 2024-05-10 DIAGNOSIS — K219 Gastro-esophageal reflux disease without esophagitis: Secondary | ICD-10-CM | POA: Diagnosis not present

## 2024-05-10 DIAGNOSIS — C259 Malignant neoplasm of pancreas, unspecified: Secondary | ICD-10-CM | POA: Insufficient documentation

## 2024-05-10 DIAGNOSIS — Z4682 Encounter for fitting and adjustment of non-vascular catheter: Secondary | ICD-10-CM | POA: Diagnosis not present

## 2024-05-10 DIAGNOSIS — J449 Chronic obstructive pulmonary disease, unspecified: Secondary | ICD-10-CM | POA: Insufficient documentation

## 2024-05-10 DIAGNOSIS — R109 Unspecified abdominal pain: Secondary | ICD-10-CM | POA: Diagnosis not present

## 2024-05-10 DIAGNOSIS — C250 Malignant neoplasm of head of pancreas: Secondary | ICD-10-CM

## 2024-05-10 DIAGNOSIS — J849 Interstitial pulmonary disease, unspecified: Secondary | ICD-10-CM | POA: Insufficient documentation

## 2024-05-10 DIAGNOSIS — I517 Cardiomegaly: Secondary | ICD-10-CM | POA: Diagnosis not present

## 2024-05-10 DIAGNOSIS — I5022 Chronic systolic (congestive) heart failure: Secondary | ICD-10-CM | POA: Diagnosis not present

## 2024-05-10 DIAGNOSIS — E039 Hypothyroidism, unspecified: Secondary | ICD-10-CM | POA: Diagnosis not present

## 2024-05-10 DIAGNOSIS — K831 Obstruction of bile duct: Secondary | ICD-10-CM | POA: Diagnosis not present

## 2024-05-10 DIAGNOSIS — Y732 Prosthetic and other implants, materials and accessory gastroenterology and urology devices associated with adverse incidents: Secondary | ICD-10-CM | POA: Insufficient documentation

## 2024-05-10 DIAGNOSIS — E119 Type 2 diabetes mellitus without complications: Secondary | ICD-10-CM | POA: Diagnosis not present

## 2024-05-10 HISTORY — PX: FIDUCIAL MARKER PLACEMENT: SHX6858

## 2024-05-10 HISTORY — PX: ERCP: SHX5425

## 2024-05-10 HISTORY — PX: EUS: SHX5427

## 2024-05-10 HISTORY — PX: ESOPHAGOGASTRODUODENOSCOPY: SHX5428

## 2024-05-10 LAB — I-STAT CHEM 8, ED
BUN: 19 mg/dL (ref 8–23)
Calcium, Ion: 1.22 mmol/L (ref 1.15–1.40)
Chloride: 102 mmol/L (ref 98–111)
Creatinine, Ser: 0.8 mg/dL (ref 0.44–1.00)
Glucose, Bld: 170 mg/dL — ABNORMAL HIGH (ref 70–99)
HCT: 42 % (ref 36.0–46.0)
Hemoglobin: 14.3 g/dL (ref 12.0–15.0)
Potassium: 3.8 mmol/L (ref 3.5–5.1)
Sodium: 139 mmol/L (ref 135–145)
TCO2: 29 mmol/L (ref 22–32)

## 2024-05-10 LAB — CBC WITH DIFFERENTIAL/PLATELET
Abs Immature Granulocytes: 0.02 K/uL (ref 0.00–0.07)
Basophils Absolute: 0 K/uL (ref 0.0–0.1)
Basophils Relative: 0 %
Eosinophils Absolute: 0 K/uL (ref 0.0–0.5)
Eosinophils Relative: 0 %
HCT: 37.4 % (ref 36.0–46.0)
Hemoglobin: 12.1 g/dL (ref 12.0–15.0)
Immature Granulocytes: 0 %
Lymphocytes Relative: 14 %
Lymphs Abs: 1.2 K/uL (ref 0.7–4.0)
MCH: 30.9 pg (ref 26.0–34.0)
MCHC: 32.4 g/dL (ref 30.0–36.0)
MCV: 95.7 fL (ref 80.0–100.0)
Monocytes Absolute: 0.1 K/uL (ref 0.1–1.0)
Monocytes Relative: 1 %
Neutro Abs: 7 K/uL (ref 1.7–7.7)
Neutrophils Relative %: 85 %
Platelets: 243 K/uL (ref 150–400)
RBC: 3.91 MIL/uL (ref 3.87–5.11)
RDW: 14.3 % (ref 11.5–15.5)
Smear Review: NORMAL
WBC: 8.3 K/uL (ref 4.0–10.5)
nRBC: 0 % (ref 0.0–0.2)

## 2024-05-10 LAB — COMPREHENSIVE METABOLIC PANEL WITH GFR
ALT: 29 U/L (ref 0–44)
AST: 63 U/L — ABNORMAL HIGH (ref 15–41)
Albumin: 4.5 g/dL (ref 3.5–5.0)
Alkaline Phosphatase: 123 U/L (ref 38–126)
Anion gap: 14 (ref 5–15)
BUN: 14 mg/dL (ref 8–23)
CO2: 23 mmol/L (ref 22–32)
Calcium: 10.3 mg/dL (ref 8.9–10.3)
Chloride: 100 mmol/L (ref 98–111)
Creatinine, Ser: 0.88 mg/dL (ref 0.44–1.00)
GFR, Estimated: >60 mL/min (ref >60)
Glucose, Bld: 167 mg/dL — ABNORMAL HIGH (ref 70–99)
Potassium: 3.6 mmol/L (ref 3.5–5.1)
Sodium: 137 mmol/L (ref 135–145)
Total Bilirubin: 0.4 mg/dL (ref 0.0–1.2)
Total Protein: 8.9 g/dL — ABNORMAL HIGH (ref 6.5–8.1)

## 2024-05-10 LAB — URINALYSIS, ROUTINE W REFLEX MICROSCOPIC
Bilirubin Urine: NEGATIVE
Glucose, UA: 50 mg/dL — AB
Hgb urine dipstick: NEGATIVE
Ketones, ur: NEGATIVE mg/dL
Leukocytes,Ua: NEGATIVE
Nitrite: NEGATIVE
Protein, ur: NEGATIVE mg/dL
Specific Gravity, Urine: 1.006 (ref 1.005–1.030)
pH: 7 (ref 5.0–8.0)

## 2024-05-10 LAB — TROPONIN T, HIGH SENSITIVITY
Troponin T High Sensitivity: <15 ng/L (ref 0–19)
Troponin T High Sensitivity: <15 ng/L (ref 0–19)

## 2024-05-10 LAB — LIPASE, BLOOD: Lipase: 191 U/L — ABNORMAL HIGH (ref 11–51)

## 2024-05-10 SURGERY — ULTRASOUND, UPPER GI TRACT, ENDOSCOPIC
Anesthesia: General

## 2024-05-10 MED ORDER — FENTANYL CITRATE (PF) 100 MCG/2ML IJ SOLN
25.0000 ug | INTRAMUSCULAR | Status: DC | PRN
  Administered 2024-05-10 (×2): 25 ug via INTRAVENOUS

## 2024-05-10 MED ORDER — ALTEPLASE 2 MG IJ SOLR
2.0000 mg | Freq: Once | INTRAMUSCULAR | Status: AC
  Administered 2024-05-10: 2 mg
  Filled 2024-05-10: qty 2

## 2024-05-10 MED ORDER — SODIUM CHLORIDE 0.9 % IV SOLN: INTRAVENOUS | Status: DC

## 2024-05-10 MED ORDER — GLUCAGON HCL RDNA (DIAGNOSTIC) 1 MG IJ SOLR
INTRAMUSCULAR | Status: AC
  Filled 2024-05-10: qty 1

## 2024-05-10 MED ORDER — LABETALOL HCL 5 MG/ML IV SOLN
INTRAVENOUS | Status: AC
  Filled 2024-05-10: qty 4

## 2024-05-10 MED ORDER — LOSARTAN POTASSIUM 50 MG PO TABS
50.0000 mg | ORAL_TABLET | Freq: Every day | ORAL | Status: DC
  Administered 2024-05-10: 50 mg via ORAL
  Filled 2024-05-10: qty 1

## 2024-05-10 MED ORDER — PROPOFOL 10 MG/ML IV BOLUS
INTRAVENOUS | Status: DC | PRN
  Administered 2024-05-10: 110 mg via INTRAVENOUS
  Administered 2024-05-10: 40 mg via INTRAVENOUS

## 2024-05-10 MED ORDER — ROCURONIUM BROMIDE 10 MG/ML (PF) SYRINGE
PREFILLED_SYRINGE | INTRAVENOUS | Status: DC | PRN
  Administered 2024-05-10: 50 mg via INTRAVENOUS

## 2024-05-10 MED ORDER — DICLOFENAC SUPPOSITORY 100 MG
RECTAL | Status: DC | PRN
  Administered 2024-05-10: 100 mg via RECTAL

## 2024-05-10 MED ORDER — OXYCODONE HCL 5 MG PO TABS: 5.0000 mg | ORAL_TABLET | Freq: Once | ORAL | Status: DC | PRN

## 2024-05-10 MED ORDER — ONDANSETRON HCL 4 MG/2ML IJ SOLN
4.0000 mg | Freq: Four times a day (QID) | INTRAMUSCULAR | Status: AC | PRN
  Administered 2024-05-10: 4 mg via INTRAVENOUS

## 2024-05-10 MED ORDER — CIPROFLOXACIN IN D5W 400 MG/200ML IV SOLN
INTRAVENOUS | Status: AC
  Filled 2024-05-10: qty 200

## 2024-05-10 MED ORDER — HYDRALAZINE HCL 20 MG/ML IJ SOLN
5.0000 mg | Freq: Once | INTRAMUSCULAR | Status: AC
  Administered 2024-05-10: 5 mg via INTRAVENOUS

## 2024-05-10 MED ORDER — LACTATED RINGERS IV BOLUS
500.0000 mL | Freq: Once | INTRAVENOUS | Status: AC
  Administered 2024-05-10: 500 mL via INTRAVENOUS

## 2024-05-10 MED ORDER — PHENYLEPHRINE HCL-NACL 20-0.9 MG/250ML-% IV SOLN: INTRAVENOUS | Status: DC | PRN

## 2024-05-10 MED ORDER — ONDANSETRON HCL 4 MG/2ML IJ SOLN
INTRAMUSCULAR | Status: DC | PRN
  Administered 2024-05-10: 4 mg via INTRAVENOUS

## 2024-05-10 MED ORDER — CHLORHEXIDINE GLUCONATE CLOTH 2 % EX PADS: 6.0000 | MEDICATED_PAD | Freq: Every day | CUTANEOUS | Status: DC

## 2024-05-10 MED ORDER — ONDANSETRON HCL 4 MG/2ML IJ SOLN
INTRAMUSCULAR | Status: AC
  Filled 2024-05-10: qty 2

## 2024-05-10 MED ORDER — LABETALOL HCL 5 MG/ML IV SOLN
5.0000 mg | Freq: Once | INTRAVENOUS | Status: AC
  Administered 2024-05-10: 5 mg via INTRAVENOUS

## 2024-05-10 MED ORDER — FENTANYL CITRATE (PF) 100 MCG/2ML IJ SOLN
INTRAMUSCULAR | Status: AC
  Filled 2024-05-10: qty 2

## 2024-05-10 MED ORDER — CIPROFLOXACIN IN D5W 400 MG/200ML IV SOLN
INTRAVENOUS | Status: DC | PRN
  Administered 2024-05-10: 400 mg via INTRAVENOUS

## 2024-05-10 MED ORDER — SODIUM CHLORIDE 0.9% FLUSH: 10.0000 mL | Freq: Two times a day (BID) | INTRAVENOUS | Status: DC

## 2024-05-10 MED ORDER — SODIUM CHLORIDE 0.9% FLUSH: 10.0000 mL | INTRAVENOUS | Status: DC | PRN

## 2024-05-10 MED ORDER — OXYCODONE HCL 5 MG PO TABS: 5.0000 mg | ORAL_TABLET | Freq: Four times a day (QID) | ORAL | 0 refills | Status: AC | PRN

## 2024-05-10 MED ORDER — DULCOLAX 5 MG PO TBEC: 10.0000 mg | DELAYED_RELEASE_TABLET | ORAL | Status: AC

## 2024-05-10 MED ORDER — FENTANYL CITRATE (PF) 100 MCG/2ML IJ SOLN
INTRAMUSCULAR | Status: DC | PRN
  Administered 2024-05-10 (×2): 50 ug via INTRAVENOUS

## 2024-05-10 MED ORDER — HYDRALAZINE HCL 20 MG/ML IJ SOLN
INTRAMUSCULAR | Status: AC
  Filled 2024-05-10: qty 1

## 2024-05-10 MED ORDER — ONDANSETRON HCL 4 MG PO TABS: 4.0000 mg | ORAL_TABLET | Freq: Three times a day (TID) | ORAL | 0 refills | Status: AC | PRN

## 2024-05-10 MED ORDER — OXYCODONE HCL 5 MG/5ML PO SOLN: 5.0000 mg | Freq: Once | ORAL | Status: DC | PRN

## 2024-05-10 MED ORDER — HEPARIN SOD (PORK) LOCK FLUSH 100 UNIT/ML IV SOLN
500.0000 [IU] | Freq: Once | INTRAVENOUS | Status: AC
  Administered 2024-05-11: 500 [IU]
  Filled 2024-05-10: qty 5

## 2024-05-10 MED ORDER — AMLODIPINE BESYLATE 5 MG PO TABS
5.0000 mg | ORAL_TABLET | Freq: Once | ORAL | Status: AC
  Administered 2024-05-10: 5 mg via ORAL
  Filled 2024-05-10: qty 1

## 2024-05-10 MED ORDER — SUGAMMADEX SODIUM 200 MG/2ML IV SOLN
INTRAVENOUS | Status: DC | PRN
  Administered 2024-05-10: 200 mg via INTRAVENOUS

## 2024-05-10 MED ORDER — DEXAMETHASONE SOD PHOSPHATE PF 10 MG/ML IJ SOLN
INTRAMUSCULAR | Status: DC | PRN
  Administered 2024-05-10: 5 mg via INTRAVENOUS

## 2024-05-10 MED ORDER — LACTATED RINGERS IV SOLN: INTRAVENOUS | Status: DC

## 2024-05-10 MED ORDER — POLYETHYLENE GLYCOL 3350 17 GM/SCOOP PO POWD: 17.0000 g | Freq: Every day | ORAL | Status: AC

## 2024-05-10 MED ORDER — DICLOFENAC SUPPOSITORY 100 MG
RECTAL | Status: AC
  Filled 2024-05-10: qty 1

## 2024-05-10 MED ORDER — IOHEXOL 300 MG/ML  SOLN
100.0000 mL | Freq: Once | INTRAMUSCULAR | Status: AC | PRN
  Administered 2024-05-10: 100 mL via INTRAVENOUS

## 2024-05-10 MED ORDER — EPHEDRINE SULFATE-NACL 50-0.9 MG/10ML-% IV SOSY
PREFILLED_SYRINGE | INTRAVENOUS | Status: DC | PRN
Start: 2024-05-10 — End: 2024-05-10
  Administered 2024-05-10: 5 mg via INTRAVENOUS

## 2024-05-10 NOTE — Transfer of Care (Signed)
 Immediate Anesthesia Transfer of Care Note  Patient: Adrienne [name]  Procedure(s) Performed: ULTRASOUND, UPPER GI TRACT, ENDOSCOPIC ERCP, WITH INTERVENTION IF INDICATED INSERTION, FIDUCIAL MARKERS EGD (ESOPHAGOGASTRODUODENOSCOPY)  Patient Location: PACU  Anesthesia Type:General  Level of Consciousness: awake  Airway & Oxygen Therapy: Patient Spontanous Breathing and Patient connected to nasal cannula oxygen  Post-op Assessment: Report given to RN and Post -op Vital signs reviewed and stable  Post vital signs: Reviewed and stable  Last Vitals:  Vitals Value Taken Time  BP    Temp    Pulse    Resp    SpO2      Last Pain:  Vitals:   05/10/24 1405  TempSrc: Temporal  PainSc: 0-No pain         Complications: No notable events documented.

## 2024-05-10 NOTE — Progress Notes (Addendum)
 Patient seen and evaluated in recovery. She had some nausea symptoms and was given an additional 4 mg Zofran . She had some RUQ pain which we discussed could be stent related from her biliary stenosis vs post-procedural pancreatitis. Her HRs have been in the 40s-50s (though she came in at the 5s). Her SBPs have been in the 200s (she did not take her ARB today). She is receiving IV pain medications. She will receive another 1 L of LR.   When this is completed, then consider 150 mL/hr of LR to be continued and bolus, if there is progressive tachycardia. She could have post-procedural pancreatitis or stent pain from her newly inserted biliary stent. I would focus on trying to get her pain controlled, but if she is not tolerating things, then getting a cross-sectional CT scan to ensure no other complications from today's procedure would not be unreasonable as well as a CBC/CMP/Lipase/Amylase/CRP. I will plan to place this patient on the list for followup tomorrow by [facility] (if she is not discharged overnight). Further treatment of her pain as per Anesthesia, while here in the Endoscopy unit (further hydralazine ). However, once she goes to the ED, will allow their team to manage. I can be available to discuss further is questions, though again, would support her with IVFs, pain control, and if worsening or not progressing, then cross-sectional imaging and re-evaluate from there. Oncall [facility] will be made aware that patient has been sent to the ED. Have discussed with patient and with family and they are in agreement with this plan of action.   Aloha Finner, MD [facility] Advanced [facility]oscopy Office # [phone]

## 2024-05-10 NOTE — ED Provider Notes (Signed)
 [place] EMERGENCY DEPARTMENT AT [facility] Provider Note   CSN: [id number] Arrival date & time: 05/10/24  8166     Patient presents with: Post-op Problem   Adrienne [name] is a 78 y.o. female.   HPI   Patient has a history of hypertension congestive heart failure diabetes breast cancer, bowel obstruction.Patient also has history of a malignant neoplasm at the head of the pancreas.  Patient had an upper GI procedure today.  She had an EUS/ERCP.  Patient following the procedure was having trouble with persistent pain.  She was also having some nausea and vomiting.  She also had an episode of bradycardia.  Her blood pressure was also elevated.  Patient was given IV fluids and additional pain medications.  She was then sent to the CT scan for further evaluation  Prior to Admission medications   Medication Sig Start Date End Date Taking? Authorizing Provider  albuterol  (VENTOLIN  HFA) 108 (90 Base) MCG/ACT inhaler Inhale 2 puffs into the lungs every 6 (six) hours as needed for wheezing or shortness of breath. 02/29/24   Rosario Leatrice FERNS, MD  bisacodyl  (DULCOLAX) 5 MG EC tablet Take 2 tablets (10 mg total) by mouth every other day. 05/10/24 05/10/25  Mansouraty, Aloha Raddle., MD  cetirizine  (ZYRTEC  ALLERGY) 10 MG tablet Take 1 tablet (10 mg total) by mouth daily. 02/29/24   Rosario Leatrice FERNS, MD  fluticasone  (FLONASE ) 50 MCG/ACT nasal spray Place 2 sprays into both nostrils daily. 12/19/23   Soldatova, Liuba, MD  Fluticasone -Umeclidin-Vilant (TRELEGY ELLIPTA ) 100-62.5-25 MCG/ACT AEPB Inhale 1 Inhalation into the lungs daily in the afternoon. 02/29/24   Rosario Leatrice FERNS, MD  furosemide  (LASIX ) 20 MG tablet Take 1 tablet (20 mg total) by mouth daily. 04/06/24   [name], MD  gabapentin  (NEURONTIN ) 100 MG capsule Take 1 capsule (100 mg total) by mouth at bedtime. 04/15/24   [name], MD  Glucose Blood (BLOOD GLUCOSE TEST STRIPS) STRP 1 each by In Vitro route in  the morning, at noon, and at bedtime. May substitute to any manufacturer covered by patient's insurance. 07/22/23   [name], MD  guaiFENesin  (ROBITUSSIN) 100 MG/5ML liquid Take 5 mLs by mouth every 4 (four) hours as needed for cough or to loosen phlegm. 02/29/24   Rosario Leatrice FERNS, MD  insulin  glargine (LANTUS ) 100 UNIT/ML injection Inject 10 Units into the skin at bedtime.    [provider]  lidocaine -prilocaine  (EMLA ) cream Apply 1 Application topically as needed (port). 02/29/24   Rosario Leatrice FERNS, MD  losartan  (COZAAR ) 100 MG tablet Take 0.5 tablets (50 mg total) by mouth daily. 03/11/24   [name], MD  NIFEdipine  (ADALAT  CC) 30 MG 24 hr tablet Take 1 tablet (30 mg total) by mouth daily. 02/29/24 04/15/24  Rosario Leatrice FERNS, MD  ondansetron  (ZOFRAN ) 4 MG tablet Take 1 tablet (4 mg total) by mouth every 8 (eight) hours as needed for nausea or vomiting. 05/10/24   Mansouraty, Aloha Raddle., MD  oxyCODONE  (ROXICODONE ) 5 MG immediate release tablet Take 1 tablet (5 mg total) by mouth every 6 (six) hours as needed for up to 3 days. 05/10/24 05/13/24  Mansouraty, Gabriel Jr., MD  polyethylene glycol powder (GLYCOLAX /MIRALAX ) 17 GM/SCOOP powder Take 17 g by mouth daily. Dissolve 1 capful (17g) in 4-8 ounces of liquid and take by mouth daily. 05/10/24   Mansouraty, Aloha Raddle., MD  predniSONE  (DELTASONE ) 10 MG tablet Prednisone  40 mg p.o. once daily for 3 days, then  30 mg p.o. once daily for 3 days, then 20 mg p.o. once daily for 3 days, then 10 mg p.o. once daily for 3 days and stop. 02/29/24   Rosario Leatrice FERNS, MD  RELION PEN NEEDLES 31G X 6 MM MISC USE 1  THREE TIMES DAILY 03/15/24   [name], MD  senna-docusate (SENOKOT-S) 8.6-50 MG tablet Take 1 tablet by mouth at bedtime as needed for mild constipation. 02/29/24   Rosario Leatrice FERNS, MD  thyroid  (ARMOUR) 90 MG tablet Take 1 tablet by mouth once daily 05/05/24   Patel, Rutwik K, MD    Allergies: Lisinopril , Oxaliplatin ,  Statins, and Atorvastatin     Review of Systems  Updated Vital Signs BP (!) 196/90   Pulse (!) 54   Temp 97.6 F (36.4 C) (Oral)   Resp 16   SpO2 99%   Physical Exam Vitals and nursing note reviewed.  Constitutional:      General: She is not in acute distress.    Appearance: She is well-developed. She is not diaphoretic.  HENT:     Head: Normocephalic and atraumatic.     Right Ear: External ear normal.     Left Ear: External ear normal.  Eyes:     General: No scleral icterus.       Right eye: No discharge.        Left eye: No discharge.     Conjunctiva/sclera: Conjunctivae normal.  Neck:     Trachea: No tracheal deviation.  Cardiovascular:     Rate and Rhythm: Normal rate. Rhythm irregular.  Pulmonary:     Effort: Pulmonary effort is normal. No respiratory distress.     Breath sounds: Normal breath sounds. No stridor. No wheezing or rales.  Abdominal:     General: Bowel sounds are normal. There is no distension.     Palpations: Abdomen is soft.     Tenderness: There is abdominal tenderness. There is no guarding or rebound.     Comments: Mild ttp ruq, no rebound or guarding  Musculoskeletal:        General: No tenderness or deformity.     Cervical back: Neck supple.  Skin:    General: Skin is warm and dry.     Findings: No rash.  Neurological:     General: No focal deficit present.     Mental Status: She is alert.     Cranial Nerves: No cranial nerve deficit, dysarthria or facial asymmetry.     Sensory: No sensory deficit.     Motor: No abnormal muscle tone or seizure activity.     Coordination: Coordination normal.  Psychiatric:        Mood and Affect: Mood normal.     (all labs ordered are listed, but only abnormal results are displayed) Labs Reviewed  COMPREHENSIVE METABOLIC PANEL WITH GFR - Abnormal; Notable for the following components:      Result Value   Glucose, Bld 167 (*)    Total Protein 8.9 (*)    AST 63 (*)    All other components within  normal limits  LIPASE, BLOOD - Abnormal; Notable for the following components:   Lipase 191 (*)    All other components within normal limits  URINALYSIS, ROUTINE W REFLEX MICROSCOPIC - Abnormal; Notable for the following components:   Color, Urine STRAW (*)    APPearance HAZY (*)    Glucose, UA 50 (*)    All other components within normal limits  I-STAT CHEM 8, ED -  Abnormal; Notable for the following components:   Glucose, Bld 170 (*)    All other components within normal limits  CBC WITH DIFFERENTIAL/PLATELET  CBC WITH DIFFERENTIAL/PLATELET  TROPONIN T, HIGH SENSITIVITY  TROPONIN T, HIGH SENSITIVITY    EKG: None  Radiology: DG ERCP Result Date: 05/10/2024 EXAM: 64 SPOT IMAGES FROM AN ERCP 05/10/2024 04:54:00 PM COMPARISON: 07/08/2023 CLINICAL HISTORY: [id number] Surgery, elective [id number] Surgery, elective [id number] FLUOROSCOPY DOSE AND TYPE: Fluoroscopy time: 3 minutes 17 seconds Radiation Dose Index: Reference Air Kerma (in mGy) = 29.66 FINDINGS: Endoscopy and cannulation of the major papilla was performed by the gastroenterology service. 16 fluoroscopic images are presented for interpretation postprocedurally. Initial image demonstrates placement of 2 radiographic densities likely representing fiducial markers within the expected location of the pancreatic head. These images demonstrate recorded cannulation and guidewire placement into the right intrahepatic biliary tree, balloon sweep of the proximal common duct which is now of normal caliber, and subsequent balloon dilation of the malignant biliary stricture with subsequent metallic biliary stent placement across the expected stricture. There is good evacuation of upstream intraluminal biliary contrast. No definite extraluminal contrast extension identified. IMPRESSION: 1. Successful cannulation and guidewire placement into the right intrahepatic biliary tree, balloon sweep of the proximal common duct, and subsequent balloon dilation of the  malignant biliary stricture with metallic biliary stent placement across the expected stricture. 2. No definite extraluminal contrast extension identified. NOTE: Please refer to the procedure report for further details. Electronically signed by: Dorethia Molt MD 05/10/2024 11:14 PM EST RP Workstation: [id number]   CT ABDOMEN PELVIS W CONTRAST Result Date: 05/10/2024 EXAM: CT ABDOMEN AND PELVIS WITH CONTRAST 05/10/2024 10:15:33 PM TECHNIQUE: CT of the abdomen and pelvis was performed with the administration of 100 mL of iohexol  (OMNIPAQUE ) 300 MG/ML solution. Multiplanar reformatted images are provided for review. Automated exposure control, iterative reconstruction, and/or weight-based adjustment of the mA/kV was utilized to reduce the radiation dose to as low as reasonably achievable. COMPARISON: Chest CT of 09/05/2022 and CT abdomen and pelvis of 05/03/2024. CLINICAL HISTORY: Abdominal pain, acute, nonlocalized; pain after Endoscopic Retrograde Cholangiopancreatography (ERCP), biopsy today (unable to obtain IV access). *tracking code: Bo* FINDINGS: LOWER CHEST: Progressive changes of fibrotic interstitial lung disease is noted at the lung bases when compared to prior chest CT of 09/05/2022. Stable mild cardiomegaly. LIVER: Mild hepatic steatosis. There is encasement of the prehepatic main portal vein and terminal common hepatic artery by the mass, similar to prior examination. There are 2 new metallic fiducial markers seen within the anterior aspect of the mass. GALLBLADDER AND BILE DUCTS: Interval metallic extrahepatic internal biliary stent placement with development of mild nondependent pneumobilia in keeping with patency of the stented segment. No intrahepatic biliary ductal dilation. No enhancing intrahepatic mass. SPLEEN: No acute abnormality. PANCREAS: The mass is centered within the pancreatic head. There is marked dilation of the pancreatic duct upstream from the mass along with marked pancreatic  parenchymal atrophy. ADRENAL GLANDS: No acute abnormality. KIDNEYS, URETERS AND BLADDER: The right kidney is ectopically positioned within the right hemipelvis. The kidneys are otherwise unremarkable. No stones in the kidneys or ureters. No hydronephrosis. No perinephric or periureteral stranding. Bladder is partially obscured by streak artifact. Visualized portion is unremarkable. GI AND BOWEL: Linear foreign body is seen within the intraluminal distal small bowel in keeping with an exposed Cylastic internal biliary stent. The stomach, small bowel, and large bowel are otherwise unremarkable. There is no bowel obstruction. PERITONEUM AND RETROPERITONEUM: No ascites. No free air. VASCULATURE: Extensive  aortoiliac atherosclerotic calcification. No aortic aneurysm. LYMPH NODES: No lymphadenopathy. REPRODUCTIVE ORGANS: Probable hysterectomy though the upper pelvis is obscured by streak artifact. BONES AND SOFT TISSUES: Bilateral total hip arthroplasty has been performed. Osseous structures are age appropriate. No acute bone abnormality. No lytic or blastic bone lesion. No focal soft tissue abnormality. IMPRESSION: 1. Mass centered in the pancreatic head with encasement of the prehepatic main portal vein and indeterminate common hepatic artery, similar to prior; marked upstream pancreatic ductal dilation and pancreatic atrophy; two new metallic fiducial markers within the anterior aspect of the mass. No superimposed pancreatic inflammatory changes. No free intraperitoneal gas or fluid. 2. Interval metallic extrahepatic internal biliary stent placement with mild nondependent pneumobilia, consistent with stent patency; no intrahepatic biliary ductal dilation 3. Linear foreign body within the distal small bowel compatible with an expelled internal biliary stent segment Electronically signed by: Dorethia Molt MD 05/10/2024 11:11 PM EST RP Workstation: [id number]   DG Chest Portable 1 View Result Date: 05/10/2024 CLINICAL  DATA:  Chest pain EXAM: PORTABLE CHEST 1 VIEW COMPARISON:  Chest x-ray 04/05/2024 FINDINGS: Heart is enlarged. Left chest port catheter tip ends in the SVC, unchanged. Right axillary surgical clips are present. There is minimal bibasilar atelectasis or scarring. No new focal lung infiltrate, pleural effusion or pneumothorax. IMPRESSION: 1. Minimal bibasilar atelectasis or scarring. 2. Cardiomegaly. Electronically Signed   By: Greig Pique M.D.   On: 05/10/2024 20:41   DG Abd 1 View - KUB Result Date: 05/10/2024 CLINICAL DATA:  Biliary stent removal. EXAM: ABDOMEN - 1 VIEW COMPARISON:  April 05, 2024 FINDINGS: The bowel gas pattern is normal. A 6.1 cm curvilinear displaced radiopaque biliary stent is again seen overlying the mid pelvis, slightly to the left of midline. This is seen on the prior study. No radio-opaque calculi or other significant radiographic abnormality are seen. Intact bilateral total hip replacements are noted. IMPRESSION: Displaced biliary stent overlying the mid pelvis. Electronically Signed   By: [name] Dials M.D.   On: 05/10/2024 16:27     Procedures   Medications Ordered in the ED  losartan  (COZAAR ) tablet 50 mg (50 mg Oral Given 05/10/24 1955)  sodium chloride  flush (NS) 0.9 % injection 10-40 mL ( Intracatheter Not Given 05/10/24 2253)  sodium chloride  flush (NS) 0.9 % injection 10-40 mL (has no administration in time range)  Chlorhexidine  Gluconate Cloth 2 % PADS 6 each (0 each Topical Hold 05/10/24 2240)  amLODipine  (NORVASC ) tablet 5 mg (has no administration in time range)  lactated ringers  bolus 500 mL (0 mLs Intravenous Stopped 05/10/24 2245)  alteplase  (CATHFLO ACTIVASE ) injection 2 mg (2 mg Intracatheter Given 05/10/24 2114)  iohexol  (OMNIPAQUE ) 300 MG/ML solution 100 mL (100 mLs Intravenous Contrast Given 05/10/24 2159)    Clinical Course as of 05/10/24 2337  Mon May 10, 2024  2018 Patient has a Port-A-Cath.  Staff unable to obtain any blood.  IV team  was also called.  Patient refusing peripheral IV lab draw or placement at this time.  IV team is going to administer TNK to port a cath [JK]  2225 Lipase, blood(!) Lipase increased compared to previous.  Metabolic panel with mild hyperglycemia [JK]  2226 Urinalysis, Routine w reflex microscopic -Urine, Clean Catch(!) Urinalysis normal [JK]  2226 CBC with Differential/Platelet CBC without leukocytosis stable hemoglobin [JK]  2321 .  CT scan shows the mass in the pancreatic region previously noted.  No signs of inflammatory changes. [JK]    Clinical Course User Index [JK] Randol Simmonds,  MD                                 Medical Decision Making Problems Addressed: Hypertension, unspecified type: chronic illness or injury with exacerbation, progression, or side effects of treatment Malignant neoplasm of pancreas, unspecified location of malignancy [facility]): chronic illness or injury with exacerbation, progression, or side effects of treatment  Amount and/or Complexity of Data Reviewed Labs: ordered. Decision-making details documented in ED Course. Radiology: ordered and independent interpretation performed.  Risk OTC drugs. Prescription drug management.   Patient presented to the ED for evaluation of pain after an endoscopy procedure today.    Patient was also noted to have episodes of bradycardia and hypertension although she had not taken her medications today.  ED workup is reassuring.  Patient has mild elevation in her lipase but the CT scan does not show any signs of acute pancreatitis.  There is no signs of acute complication associated with her procedure.  Patient was noted to be hypertensive.  She was given an oral dose of her medications.  Will give an additional dose but otherwise appears stable for discharge and close outpatient follow-up.  Evaluation and diagnostic testing in the emergency department does not suggest an emergent condition requiring admission or immediate  intervention beyond what has been performed at this time.  The patient is safe for discharge and has been instructed to return immediately for worsening symptoms, change in symptoms or any other concerns.     Final diagnoses:  Hypertension, unspecified type  Malignant neoplasm of pancreas, unspecified location of malignancy [facility])    ED Discharge Orders     None          Randol Simmonds, MD 05/10/24 2338

## 2024-05-10 NOTE — Anesthesia Postprocedure Evaluation (Signed)
 Anesthesia Post Note  Patient: Adrienne [name]  Procedure(s) Performed: ULTRASOUND, UPPER GI TRACT, ENDOSCOPIC ERCP, WITH INTERVENTION IF INDICATED INSERTION, FIDUCIAL MARKERS EGD (ESOPHAGOGASTRODUODENOSCOPY)     Patient location during evaluation: PACU Anesthesia Type: General Level of consciousness: awake and alert and oriented Pain management: pain level controlled Vital Signs Assessment: post-procedure vital signs reviewed and stable Respiratory status: spontaneous breathing, nonlabored ventilation and respiratory function stable Cardiovascular status: blood pressure returned to baseline and stable Postop Assessment: no apparent nausea or vomiting Anesthetic complications: no Comments: Given labetalol and hydralazine  for elevated BP post procedure.   No notable events documented.  Last Vitals:  Vitals:   05/10/24 1805 05/10/24 1810  BP: (!) 207/87 (!) 200/78  Pulse:  (!) 44  Resp:  14  Temp:    SpO2:  99%    Last Pain:  Vitals:   05/10/24 1810  TempSrc:   PainSc: (P) 6                  [name] A.

## 2024-05-10 NOTE — ED Notes (Signed)
 Patient transported to CT

## 2024-05-10 NOTE — H&P (Signed)
 GASTROENTEROLOGY PROCEDURE H&P NOTE   Primary Care Physician: Adrienne Suzzane POUR, MD  HPI: Adrienne [name] is a 78 y.o. female who presents for EUS/ERCP for history of pancreatic malignancy and need for fiducials for SBRT.  Past Medical History:  Diagnosis Date   Allergy    Ambulates with cane    straight cane   Arthritis    knee, back   CHF (congestive heart failure) (HCC)    Ductal carcinoma in situ of breast 12/2018   R Breast-mastectomy only   Gout    History of blood transfusion 1986   w/ Hysterectomy surgery   Hyperlipidemia    diet controlled - no meds   Hypertension    Hypothyroidism    Pelvic kidney    lower right pelvic kidney    SBO (small bowel obstruction) (HCC) 10/2016   surgery    Sleep apnea    Mild - no mask needed per sleep study   Smoker    quit smoking 2018   Type 2 diabetes mellitus (HCC)    Past Surgical History:  Procedure Laterality Date   ABDOMINAL HYSTERECTOMY  1986   COMPLETE-precancerous   APPENDECTOMY     pt states it was removed when gallbladder was removed.   AUGMENTATION MAMMAPLASTY     BACK SURGERY     lower back   BILIARY BRUSHING  07/08/2023   Procedure: BILIARY BRUSHING;  Surgeon: Wilhelmenia Adrienne Raddle., MD;  Location: [facility];  Service: Gastroenterology;;   BILIARY STENT PLACEMENT N/A 07/08/2023   Procedure: BILIARY STENT PLACEMENT;  Surgeon: Wilhelmenia Adrienne Raddle., MD;  Location: [facility];  Service: Gastroenterology;  Laterality: N/A;   BIOPSY  06/14/2023   Procedure: BIOPSY;  Surgeon: Avram Lupita BRAVO, MD;  Location: [facility];  Service: Gastroenterology;;   BIOPSY  07/08/2023   Procedure: BIOPSY;  Surgeon: Wilhelmenia Adrienne Raddle., MD;  Location: [facility];  Service: Gastroenterology;;   BREAST BIOPSY Right 05/12/2019   times 2   BREAST LUMPECTOMY WITH RADIOACTIVE SEED LOCALIZATION Right 03/18/2019   Procedure: RIGHT BREAST LUMPECTOMY WITH RADIOACTIVE SEED LOCALIZATION;  Surgeon: [name], MD;   Location: [facility];  Service: General;  Laterality: Right;   CHOLECYSTECTOMY     COLONOSCOPY  02/15/2008   Kaplan    COLONOSCOPY WITH PROPOFOL   02/27/2018   Dr.Nandigam   ECTOPIC PREGNANCY SURGERY     ERCP N/A 06/14/2023   Procedure: ENDOSCOPIC RETROGRADE CHOLANGIOPANCREATOGRAPHY (ERCP);  Surgeon: Avram Lupita BRAVO, MD;  Location: [facility];  Service: Gastroenterology;  Laterality: N/A;   ERCP N/A 06/27/2023   Procedure: ENDOSCOPIC RETROGRADE CHOLANGIOPANCREATOGRAPHY (ERCP);  Surgeon: Charlanne Groom, MD;  Location: [facility];  Service: Gastroenterology;  Laterality: N/A;   ERCP N/A 07/08/2023   Procedure: ENDOSCOPIC RETROGRADE CHOLANGIOPANCREATOGRAPHY (ERCP);  Surgeon: Wilhelmenia Adrienne Raddle., MD;  Location: [facility];  Service: Gastroenterology;  Laterality: N/A;   ESOPHAGOGASTRODUODENOSCOPY N/A 07/08/2023   Procedure: ESOPHAGOGASTRODUODENOSCOPY (EGD);  Surgeon: Wilhelmenia Adrienne Raddle., MD;  Location: [facility];  Service: Gastroenterology;  Laterality: N/A;   ESOPHAGOGASTRODUODENOSCOPY (EGD) WITH PROPOFOL  N/A 06/14/2023   Procedure: ESOPHAGOGASTRODUODENOSCOPY (EGD) WITH PROPOFOL ;  Surgeon: Avram Lupita BRAVO, MD;  Location: [facility];  Service: Gastroenterology;  Laterality: N/A;   EUS N/A 07/08/2023   Procedure: UPPER ENDOSCOPIC ULTRASOUND (EUS) LINEAR;  Surgeon: Wilhelmenia Adrienne Raddle., MD;  Location: [facility];  Service: Gastroenterology;  Laterality: N/A;   FINE NEEDLE ASPIRATION N/A 07/08/2023   Procedure: FINE NEEDLE ASPIRATION (FNA) LINEAR;  Surgeon: Wilhelmenia Adrienne Raddle., MD;  Location: [facility];  Service: Gastroenterology;  Laterality: N/A;   IR IMAGING GUIDED [name] INSERTION  07/29/2023   JOINT REPLACEMENT     Left hip total Dr. Melodi 10-01-17   MALONEY DILATION  06/14/2023   Procedure: AGAPITO DILATION;  Surgeon: Avram Lupita BRAVO, MD;  Location: [facility];  Service: Gastroenterology;;   MASTECTOMY Right 07/26/2019   PANCREATIC STENT PLACEMENT  06/14/2023   Procedure:  PANCREATIC STENT PLACEMENT;  Surgeon: Avram Lupita BRAVO, MD;  Location: [facility];  Service: Gastroenterology;;   PANCREATIC STENT PLACEMENT  06/27/2023   Procedure: PANCREATIC STENT PLACEMENT;  Surgeon: Charlanne Groom, MD;  Location: [facility];  Service: Gastroenterology;;   POLYPECTOMY     polypectomy-oropharynx     RE-EXCISION OF BREAST CANCER,SUPERIOR MARGINS Right 04/08/2019   Procedure: RE-EXCISION OF RIGHT BREAST ANTERIOR MARGINS;  Surgeon: Curvin Mt III, MD;  Location: [facility];  Service: General;  Laterality: Right;   right knee meniscus     SBO  10/2016   small bowel obstruction   SIMPLE MASTECTOMY WITH AXILLARY SENTINEL NODE BIOPSY Right 07/26/2019   Procedure: RIGHT MASTECTOMY WITH SENTINEL NODE BIOPSY;  Surgeon: Curvin Mt MOULD, MD;  Location: [facility];  Service: General;  Laterality: Right;   SPHINCTEROTOMY  06/27/2023   Procedure: SPHINCTEROTOMY;  Surgeon: Charlanne Groom, MD;  Location: [facility];  Service: Gastroenterology;;   ANNETT  07/08/2023   Procedure: ANNETT;  Surgeon: Wilhelmenia Adrienne Raddle., MD;  Location: [facility];  Service: Gastroenterology;;   TOTAL HIP ARTHROPLASTY Left 10/01/2017   Procedure: LEFT  TOTAL HIP ARTHROPLASTY ANTERIOR APPROACH;  Surgeon: Melodi Lerner, MD;  Location: [facility];  Service: Orthopedics;  Laterality: Left;   TOTAL HIP ARTHROPLASTY Right 03/11/2018   Procedure: RIGHT TOTAL HIP ARTHROPLASTY ANTERIOR APPROACH;  Surgeon: Melodi Lerner, MD;  Location: [facility];  Service: Orthopedics;  Laterality: Right;    UPPER GASTROINTESTINAL ENDOSCOPY     No current facility-administered medications for this encounter.   No current facility-administered medications for this encounter. Allergies  Allergen Reactions   Lisinopril  Swelling    Angioedema   Oxaliplatin  Shortness Of Breath and Swelling   Statins     Myalgias   Atorvastatin  Other (See Comments)    Muscle aches    Family History  Problem Relation  Age of Onset   Cancer Sister    Diabetes Brother    Diabetes Brother    Hypertension Other    Breast cancer Sister    Colon cancer Neg Hx    Colon polyps Neg Hx    Rectal cancer Neg Hx    Stomach cancer Neg Hx    Social History   Socioeconomic History   Marital status: Widowed    Spouse name: Not on file   Number of children: 3   Years of education: 76   Highest education level: Some college, no degree  Occupational History   Occupation: retired  Tobacco Use   Smoking status: Former    Current packs/day: 0.00    Average packs/day: 1 pack/day for 32.0 years (32.0 ttl pk-yrs)    Types: E-cigarettes, Cigarettes    Start date: 11/20/1984    Quit date: 11/20/2016    Years since quitting: 7.4   Smokeless tobacco: Never   Tobacco comments:    Smoker since 1975 has quit and started back several times    As of 07/03/23: smoked 30 years, 1 PPD, quit 2018  Vaping Use   Vaping status: Former  Substance and Sexual Activity   Alcohol use: No  Drug use: No   Sexual activity: Not Currently    Birth control/protection: Surgical    Comment: Hysterectomy  Other Topics Concern   Not on file  Social History Narrative   Widow since 2006.Married previously for 25 years.Lives with grandson and 2 great grandchildren.Retired.Previously office work.   Social Drivers of Corporate Investment Banker Strain: Low Risk  (06/27/2021)   Overall Financial Resource Strain (CARDIA)    Difficulty of Paying Living Expenses: Not hard at all  Food Insecurity: No Food Insecurity (02/27/2024)   Hunger Vital Sign    Worried About Running Out of Food in the Last Year: Never true    Ran Out of Food in the Last Year: Never true  Transportation Needs: No Transportation Needs (02/27/2024)   PRAPARE - Administrator, Civil Service (Medical): No    Lack of Transportation (Non-Medical): No  Physical Activity: Inactive (06/27/2021)   Exercise Vital Sign    Days of Exercise per Week: 0 days    Minutes of  Exercise per Session: 0 min  Stress: No Stress Concern Present (06/27/2021)   [employer] of Occupational Health - Occupational Stress Questionnaire    Feeling of Stress : Not at all  Social Connections: Moderately Integrated (02/27/2024)   Social Connection and Isolation Panel    Frequency of Communication with Friends and Family: More than three times a week    Frequency of Social Gatherings with Friends and Family: Once a week    Attends Religious Services: More than 4 times per year    Active Member of [employer] or Organizations: Yes    Attends Banker Meetings: More than 4 times per year    Marital Status: Widowed  Intimate Partner Violence: Not At Risk (02/27/2024)   Humiliation, Afraid, Rape, and Kick questionnaire    Fear of Current or Ex-Partner: No    Emotionally Abused: No    Physically Abused: No    Sexually Abused: No    Physical Exam: There were no vitals filed for this visit. There is no height or weight on file to calculate BMI. GEN: NAD EYE: Sclerae anicteric ENT: MMM CV: Non-tachycardic GI: Soft, NT/ND NEURO:  Alert & Oriented x 3  Lab Results: No results for input(s): WBC, HGB, HCT, PLT in the last 72 hours. BMET No results for input(s): NA, K, CL, CO2, GLUCOSE, BUN, CREATININE, CALCIUM  in the last 72 hours. LFT No results for input(s): PROT, ALBUMIN, AST, ALT, ALKPHOS, BILITOT, BILIDIR, IBILI in the last 72 hours. PT/INR No results for input(s): LABPROT, INR in the last 72 hours.   Impression / Plan: This is a 78 y.o.female who presents for EUS/ERCP for history of pancreatic malignancy and need for fiducials for SBRT.  The risks of an EUS including intestinal perforation, bleeding, infection, aspiration, and medication effects were discussed as was the possibility it may not give a definitive diagnosis if a biopsy is performed.  When a biopsy of the pancreas is done as part of the EUS, there is an  additional risk of pancreatitis at the rate of about 1-2%.  It was explained that procedure related pancreatitis is typically mild, although it can be severe and even life threatening, which is why we do not perform random pancreatic biopsies and only biopsy a lesion/area we feel is concerning enough to warrant the risk.  The risks of an ERCP were discussed at length, including but not limited to the risk of perforation, bleeding, abdominal pain, post-ERCP pancreatitis (  while usually mild can be severe and even life threatening).   The risks and benefits of endoscopic evaluation/treatment were discussed with the patient and/or family; these include but are not limited to the risk of perforation, infection, bleeding, missed lesions, lack of diagnosis, severe illness requiring hospitalization, as well as anesthesia and sedation related illnesses.  The patient's history has been reviewed, patient examined, no change in status, and deemed stable for procedure.  The patient and/or family was provided an opportunity to ask questions and all were answered.  The patient and/or family is agreeable to proceed.    Adrienne Finner, MD [facility] Gastroenterology Advanced Endoscopy Office # [phone]

## 2024-05-10 NOTE — Discharge Instructions (Signed)

## 2024-05-10 NOTE — Anesthesia Procedure Notes (Signed)
 Procedure Name: Intubation Date/Time: 05/10/2024 3:18 PM  Performed by: Judythe Tanda Aran, CRNAPre-anesthesia Checklist: Patient identified, Emergency Drugs available, Suction available and Patient being monitored Patient Re-evaluated:Patient Re-evaluated prior to induction Oxygen Delivery Method: Circle system utilized Preoxygenation: Pre-oxygenation with 100% oxygen Induction Type: IV induction Ventilation: Mask ventilation without difficulty Laryngoscope Size: 2 and Miller Grade View: Grade I Tube type: Oral Tube size: 7.0 mm Number of attempts: 1 Airway Equipment and Method: Stylet Placement Confirmation: ETT inserted through vocal cords under direct vision, positive ETCO2 and breath sounds checked- equal and bilateral Secured at: 21 cm Tube secured with: Tape Dental Injury: Teeth and Oropharynx as per pre-operative assessment

## 2024-05-10 NOTE — Anesthesia Preprocedure Evaluation (Addendum)
 Anesthesia Evaluation  Patient identified by MRN, date of birth, ID band Patient awake    Reviewed: Allergy & Precautions, H&P , NPO status , Patient's Chart, lab work & pertinent test results  Airway Mallampati: II  TM Distance: >3 FB Neck ROM: Full    Dental  (+) Dental Advisory Given, Chipped   Pulmonary sleep apnea , pneumonia, resolved, COPD,  COPD inhaler, former smoker ILD   breath sounds clear to auscultation       Cardiovascular hypertension, Pt. on medications + Peripheral Vascular Disease and +CHF  + Valvular Problems/Murmurs MVP  Rhythm:regular Rate:Normal  Echo 09/12/22 1. Left ventricular ejection fraction, by estimation, is 45 to 50%. The  left ventricle has mildly decreased function. The left ventricle  demonstrates global hypokinesis. Left ventricular diastolic parameters are  consistent with Grade II diastolic  dysfunction (pseudonormalization).   2. Right ventricular systolic function is normal. The right ventricular  size is normal.   3. Left atrial size was moderately dilated.   4. The mitral valve is normal in structure. Mild mitral valve  regurgitation. No evidence of mitral stenosis.   5. The aortic valve is tricuspid. Aortic valve regurgitation is not  visualized. Aortic valve sclerosis is present, with no evidence of aortic  valve stenosis.   6. Aortic dilatation noted. There is borderline dilatation of the  ascending aorta, measuring 38 mm.   7. The inferior vena cava is normal in size with greater than 50%  respiratory variability, suggesting right atrial pressure of 3 mmHg.    EKG 02/26/24 Ectopic atrial rhythm, RBBB pattern, LVH with non specific IVCD with repolarization abnormality, Inferior MI, Prolonged QTc   Neuro/Psych Chemo induced peripheral neuropathy  Neuromuscular disease    GI/Hepatic Neg liver ROS,GERD  ,,Pancreatic mass   Endo/Other  diabetes, Well Controlled, Type  2Hypothyroidism  Hx/o DCIS right breast HLD   Renal/GU Renal InsufficiencyRenal diseaseLab Results      Component                Value               Date                      NA                       139                 04/19/2024                CL                       105                 04/19/2024                K                        3.6                 04/19/2024                CO2                      27                  04/19/2024  BUN                      26 (H)              04/19/2024                CREATININE               1.09 (H)            04/19/2024                GFRNONAA                 53 (L)              04/19/2024                CALCIUM                   9.5                 04/19/2024                PHOS                     3.1                 11/17/2016                ALBUMIN                  3.9                 04/19/2024                GLUCOSE                  102 (H)             04/19/2024                Musculoskeletal  (+) Arthritis ,    Abdominal   Peds  Hematology  (+) Blood dyscrasia, anemia Lab Results      Component                Value               Date                      WBC                      9.8                 04/19/2024                HGB                      10.7 (L)            04/19/2024                HCT                      33.3 (L)            04/19/2024                MCV  95.1                04/19/2024                PLT                      215                 04/19/2024              Anesthesia Other Findings   Reproductive/Obstetrics                              Anesthesia Physical Anesthesia Plan  ASA: 3  Anesthesia Plan: General   Post-op Pain Management:    Induction: Intravenous  PONV Risk Score and Plan: 3 and Ondansetron , Dexamethasone  and Treatment may vary due to age or medical condition  Airway Management Planned: Oral ETT  Additional Equipment:    Intra-op Plan:   Post-operative Plan: Extubation in OR  Informed Consent: I have reviewed the patients History and Physical, chart, labs and discussed the procedure including the risks, benefits and alternatives for the proposed anesthesia with the patient or authorized representative who has indicated his/her understanding and acceptance.     Dental advisory given  Plan Discussed with: CRNA, Anesthesiologist and Surgeon  Anesthesia Plan Comments:          Anesthesia Quick Evaluation

## 2024-05-10 NOTE — ED Triage Notes (Signed)
 Patient presents from Endoscopy post KUB and ERCP. During the procedure the patient was administered 100 mcg of fentanyl  and a partially covered stent was placed in her bowel as markers placed in the pancreatic mass. She still has previous stents in her colon, but she was given Mira lax. Post op, HR was in the 40's and systolic BP was 200. She was given 5 mg of Labetalol, 5 mg hydralazine , and 50 mcg of Fentanyl .    Hx: pancreatic cancer

## 2024-05-10 NOTE — Progress Notes (Signed)
 IV Team to bedside to de-access port x2; pt still requiring medications- RN to re-consult.

## 2024-05-10 NOTE — Discharge Instructions (Signed)
 Continue your medications as we discussed.  Follow-up with your oncology and GI doctors as planned.

## 2024-05-10 NOTE — Op Note (Addendum)
 [facility] Patient Name: Adrienne [name] Procedure Date: 05/10/2024 MRN: [id number] Attending MD: Aloha Finner , MD, [id number] Date of Birth: 1945/08/12 CSN: [id number] Age: 78 Admit Type: Outpatient Procedure:                ERCP Indications:              Malignant tumor of the head of pancreas, Stent                            change Providers:                Aloha Finner, MD, Olam Riedel, RN, Corene Southgate,                            Technician, Lorrayne Kitty, Technician Referring MD:              Medicines:                Monitored Anesthesia Care Complications:            No immediate complications. Estimated Blood Loss:     Estimated blood loss was minimal. Procedure:                Pre-Anesthesia Assessment:                           - Prior to the procedure, a History and Physical                            was performed, and patient medications and                            allergies were reviewed. The patient's tolerance of                            previous anesthesia was also reviewed. The risks                            and benefits of the procedure and the sedation                            options and risks were discussed with the patient.                            All questions were answered, and informed consent                            was obtained. Prior Anticoagulants: The patient has                            taken no anticoagulant or antiplatelet agents. ASA                            Grade Assessment: III - A patient with severe  systemic disease. After reviewing the risks and                            benefits, the patient was deemed in satisfactory                            condition to undergo the procedure.                           After obtaining informed consent, the scope was                            passed under direct vision. Throughout the                            procedure, the patient's  blood pressure, pulse, and                            oxygen saturations were monitored continuously. The                            [employer] D single use                            duodenoscope was introduced through the mouth, and                            used to inject contrast into and used to inject                            contrast into the bile duct. The ERCP was                            accomplished without difficulty. The patient                            tolerated the procedure. Findings:      The scout film was normal (biliary stent not noted in the RUQ. Separate       KUB performed showed what appeared to be the biliary stent within the       midabdomen/pelvis (formal KUB read pending).      The upper GI tract was traversed under direct vision without detailed       examination. A biliary sphincterotomy had been performed. The       sphincterotomy appeared open. One stent which had been placed through       the major papilla into the biliary tree was no longer visible (as noted       above and it had migrated out of the duct).      A 0.035 inch x 260 cm straight Hydra Jagwire was passed into the biliary       tree. The Hydratome sphincterotome was passed over the guidewire and the       bile duct was then deeply cannulated. Contrast was injected. I       personally interpreted the bile duct images.  Ductal flow of contrast was       adequate. Image quality was adequate. Contrast extended to the hepatic       ducts. Opacification of the entire biliary tree except for the       gallbladder was successful. The lower third of the main bile duct       contained a single severe stenosis 10 mm in length. The middle third of       the main bile duct and upper third of the main bile duct were moderately       dilated. The largest diameter was 13 mm. To discover objects, the       biliary tree was swept with a retrieval balloon. Small amount of sludge        was swept from the duct. One 10 mm by 4 cm partially covered metal stent       was placed into the common bile duct. The stent was in good position.       There was a significant waist in the proximal aspect of the stent.       Dilation of the common bile duct stent with a Hurricane 6 mm balloon       dilator was successful.      A pancreatogram was not performed.      The duodenoscope was withdrawn from the patient. Impression:               - Prior biliary sphincterotomy appeared open.                           - A previously placed stent had migrated out of the                            biliary tree.                           - A single severe biliary stricture was found in                            the lower third of the main bile duct. The                            stricture was malignant appearing.                           - The upper third of the main bile duct and middle                            third of the main bile duct were moderately dilated.                           - The biliary tree was swept and sludge was found.                           - One partially covered metal stent was placed into                            the common bile duct  to traverse the distal                            stenosis. The stent was dilated to allow the stent                            to have improved drainage. Moderate Sedation:      Not Applicable - Patient had care per Anesthesia. Recommendation:           - The patient will be observed post-procedure,                            until all discharge criteria are met.                           - Discharge patient to home.                           - Patient has a contact number available for                            emergencies. The signs and symptoms of potential                            delayed complications were discussed with the                            patient. Return to normal activities tomorrow.                             Written discharge instructions were provided to the                            patient.                           - Low fat diet.                           - Observe patient's clinical course.                           - Followup LFTs with primary Oncologic team.                           - Watch for pancreatitis, bleeding, perforation,                            and cholangitis.                           - For biliary stent that has migrated into the                            bowel, I recommend Miralax  daily + Dulcolax every  other day. Plan to repeat imaging of the                            abdomen/pelvis (at minimum with KUB) in 14-days. If                            still present, then will need formal CT of the                            abdomen/pelvis (recent imaging was just of the                            abdomen).                           - The findings and recommendations were discussed                            with the patient.                           - The findings and recommendations were discussed                            with the patient's family. Procedure Code(s):        --- Professional ---                           [id number], Endoscopic retrograde                            cholangiopancreatography (ERCP); with removal and                            exchange of stent(s), biliary or pancreatic duct,                            including pre- and post-dilation and guide wire                            passage, when performed, including sphincterotomy,                            when performed, each stent exchanged                           43264, Endoscopic retrograde                            cholangiopancreatography (ERCP); with removal of                            calculi/debris from biliary/pancreatic duct(s)                           74328, 26, Endoscopic catheterization of the  biliary ductal system,  radiological supervision and                            interpretation Diagnosis Code(s):        --- Professional ---                           U14.479J, Displacement of bile duct prosthesis,                            initial encounter                           K83.1, Obstruction of bile duct                           C25.0, Malignant neoplasm of head of pancreas                           Z46.59, Encounter for fitting and adjustment of                            other gastrointestinal appliance and device                           K83.8, Other specified diseases of biliary tract CPT copyright 2022 [employer]. All rights reserved. The codes documented in this report are preliminary and upon coder review may  be revised to meet current compliance requirements. Aloha Finner, MD 05/10/2024 5:18:08 PM Number of Addenda: 0

## 2024-05-10 NOTE — Op Note (Signed)
 [facility] Patient Name: Adrienne [name] Procedure Date: 05/10/2024 MRN: [id number] Attending MD: Aloha Finner , MD, [id number] Date of Birth: 30-Aug-1945 CSN: [id number] Age: 78 Admit Type: Outpatient Procedure:                Upper EUS Indications:              Pancreatic adenocarcinoma/Post-chemotherapy,                            Fiducial Providers:                Aloha Finner, MD, Olam Riedel, RN, Corene Southgate,                            Technician, Lorrayne Kitty, Technician Referring MD:              Medicines:                General Anesthesia Complications:            No immediate complications. Estimated Blood Loss:     Estimated blood loss was minimal. Procedure:                Pre-Anesthesia Assessment:                           - Prior to the procedure, a History and Physical                            was performed, and patient medications and                            allergies were reviewed. The patient's tolerance of                            previous anesthesia was also reviewed. The risks                            and benefits of the procedure and the sedation                            options and risks were discussed with the patient.                            All questions were answered, and informed consent                            was obtained. Prior Anticoagulants: The patient has                            taken no anticoagulant or antiplatelet agents. ASA                            Grade Assessment: III - A patient with severe                            systemic disease. After reviewing  the risks and                            benefits, the patient was deemed in satisfactory                            condition to undergo the procedure.                           After obtaining informed consent, the endoscope was                            passed under direct vision. Throughout the                            procedure, the  patient's blood pressure, pulse, and                            oxygen saturations were monitored continuously. The                            [employer] D single use                            duodenoscope was introduced through the mouth, and                            advanced to the area of papilla. The [id number]                            ([id number]) Olympus endosonoscope was introduced                            through the mouth, and advanced to the duodenum for                            ultrasound examination from the stomach and                            duodenum. The upper EUS was accomplished without                            difficulty. The patient tolerated the procedure. Scope In: Scope Out: Findings:      ENDOSCOPIC FINDING: :      No gross lesions were noted in the entire examined stomach.      No gross lesions were noted in the duodenal bulb, in the first portion       of the duodenum and in the second portion of the duodenum. Biliary stent       not noted.      There was evidence of a patent sphincterotomy in the major papilla.      ENDOSONOGRAPHIC FINDING: :      An irregular mass was identified in the pancreatic head. The mass was       hypoechoic. The mass measured 21 mm  by 25 mm in maximal cross-sectional       diameter. The outer margins were irregular. There was sonographic       evidence suggesting invasion into the portal vein (manifested by       abutment) and the superior mesenteric vein (manifested by encasement).       An intact interface was seen between the mass and the superior       mesenteric artery and celiac trunk suggesting a lack of invasion. The       remainder of the pancreas was examined. The endosonographic appearance       of parenchyma and the upstream pancreatic duct indicated duct dilation       and parenchymal atrophy. Fiducial marker placement was performed. Once       the target lesion in the head of the pancreas was  identified a preloaded       marker in a 22 gauge needle was then deployed in the lesion. This was       repeated for a total of four markers. Impression:               EGD Impression:                           - No gross lesions in the entire stomach.                           - No gross lesions in the duodenal bulb, in the                            first portion of the duodenum and in the second                            portion of the duodenum.                           - Patent sphincterotomy was found. Biliary stent                            not noted.                           - A mass was identified in the pancreatic head. A                            tissue diagnosis was obtained prior to this exam.                            This is consistent with adenocarcinoma.                           EUS Impression:                           - Fiducial markers were deployed in the HOP mass. Moderate Sedation:      Not Applicable - Patient had care per Anesthesia. Recommendation:           - Proceed to scheduled ERCP.                           -  Observe patient's clinical course.                           - Followup with Oncology and Radiation Oncology for                            SBRT simulation timing and further chemotherapeutic                            discussions. Procedure Code(s):        --- Professional ---                           [phone], Esophagogastroduodenoscopy, flexible,                            transoral; with transendoscopic ultrasound-guided                            transmural injection of diagnostic or therapeutic                            substance(s) (eg, anesthetic, neurolytic agent) or                            fiducial marker(s) (includes endoscopic ultrasound                            examination of the esophagus, stomach, and either                            the duodenum or a surgically altered stomach where                            the jejunum is  examined distal to the anastomosis) Diagnosis Code(s):        --- Professional ---                           S01.109, Other specified postprocedural states                           K86.89, Other specified diseases of pancreas                           C25.9, Malignant neoplasm of pancreas, unspecified                           Z08, Encounter for follow-up examination after                            completed treatment for malignant neoplasm CPT copyright 2022 [employer] Association. All rights reserved. The codes documented in this report are preliminary and upon coder review may  be revised to meet current compliance requirements. Aloha Finner, MD 05/10/2024 5:11:11 PM Number of Addenda: 0

## 2024-05-11 ENCOUNTER — Telehealth: Payer: Self-pay

## 2024-05-11 DIAGNOSIS — T85520A Displacement of bile duct prosthesis, initial encounter: Secondary | ICD-10-CM

## 2024-05-11 MED ORDER — ONDANSETRON 8 MG PO TBDP
8.0000 mg | ORAL_TABLET | Freq: Once | ORAL | Status: AC
  Administered 2024-05-11: 8 mg via ORAL
  Filled 2024-05-11: qty 1

## 2024-05-11 NOTE — Telephone Encounter (Signed)
-----   Message from [facility] sent at 05/10/2024  5:33 PM EST ----- Regarding: Followup Team, EUS/ERCP completed. Fiducials in place. New SEMS placed for the biliary stenosis/obstruction (though again thankfully no jaundice noted on her or her LFTs being abnormal. I see her Biliary stent migrated into her small or large bowel. Will give her laxatives for the next 10-days to see if we can purge it out. If not, on repeat KUB, then will need formal CTAbdomen/Pelvis to see if still present and then figure out where to go from there (query stuck in an adhesed area of bowel or other issue?). Time will tell. Thanks. GM  Amerigo Mcglory, Place KUB order for 2-weeks from now for followup Biliary stent. Thanks. GM+

## 2024-05-11 NOTE — Telephone Encounter (Signed)
 2 view KUB has been entered and pt will be called in 2 weeks.

## 2024-05-11 NOTE — ED Notes (Signed)
 Pt started vomiting upon discharge. Pt was given 8mg  of Zofran  per MD. Pt was also given x3 crackers along with ginger ale. According to pt she has not eaten all day.

## 2024-05-12 ENCOUNTER — Encounter (HOSPITAL_COMMUNITY): Payer: Self-pay | Admitting: Gastroenterology

## 2024-05-13 NOTE — Telephone Encounter (Signed)
 Patient requesting f/u call in regards to symptoms she is having as well as medication management. Please advise.   Thank you

## 2024-05-13 NOTE — Telephone Encounter (Signed)
 Attempted to reach pt by phone- no answer and voice mail is full

## 2024-05-14 NOTE — Telephone Encounter (Signed)
 The pt states she would like to know if she can take miralax  for constipation.  I did confirm with her that miralax  can be taken daily as needed to maintain bowel habits. She thanked me for the call and will call back if needed. She will come in for Xray in a couple weeks as planned

## 2024-05-17 NOTE — Telephone Encounter (Signed)
 Appt rescheduled for 12/1 Copied from CRM #[id number]. Topic: Appointments - Scheduling Inquiry for Clinic >> May 17, 2024  9:35 AM Adrienne [name] wrote: Reason for CRM: The patient is needing a hospital follow up either this week or the next. She only wants to see Dr. Tobie. As of now, she is scheduled for December 8th.   Please call patient at [phone] for an update.

## 2024-05-18 NOTE — Progress Notes (Signed)
 Has armband been applied?  Yes.    Does patient have an allergy to IV contrast dye?: No.   Has patient ever received premedication for IV contrast dye?: No.   Does patient take metformin ?: No.  If patient does take metformin  when was the last dose: N/A  Date of lab work: 05/10/2024 BUN: 14 CR: 0.88 eGfr: >60  IV site: Left Chest Port  Has IV site been added to flowsheet?  Yes.    There were no vitals taken for this visit.

## 2024-05-25 ENCOUNTER — Ambulatory Visit: Admitting: Internal Medicine

## 2024-05-25 ENCOUNTER — Encounter: Payer: Self-pay | Admitting: Internal Medicine

## 2024-05-25 VITALS — BP 138/84 | HR 60 | Ht 63.0 in | Wt 147.8 lb

## 2024-05-25 DIAGNOSIS — I428 Other cardiomyopathies: Secondary | ICD-10-CM

## 2024-05-25 DIAGNOSIS — E1169 Type 2 diabetes mellitus with other specified complication: Secondary | ICD-10-CM | POA: Diagnosis not present

## 2024-05-25 DIAGNOSIS — K831 Obstruction of bile duct: Secondary | ICD-10-CM

## 2024-05-25 DIAGNOSIS — T451X5A Adverse effect of antineoplastic and immunosuppressive drugs, initial encounter: Secondary | ICD-10-CM

## 2024-05-25 DIAGNOSIS — C250 Malignant neoplasm of head of pancreas: Secondary | ICD-10-CM

## 2024-05-25 DIAGNOSIS — I10 Essential (primary) hypertension: Secondary | ICD-10-CM

## 2024-05-25 DIAGNOSIS — Z09 Encounter for follow-up examination after completed treatment for conditions other than malignant neoplasm: Secondary | ICD-10-CM | POA: Insufficient documentation

## 2024-05-25 MED ORDER — LOSARTAN POTASSIUM 100 MG PO TABS: 100.0000 mg | ORAL_TABLET | Freq: Every day | ORAL | 3 refills | Status: AC

## 2024-05-25 MED ORDER — NIFEDIPINE ER 60 MG PO TB24: 60.0000 mg | ORAL_TABLET | Freq: Every day | ORAL | 3 refills | Status: AC

## 2024-05-25 NOTE — Assessment & Plan Note (Signed)
 Followed by GI and oncology - undergoing chemotherapy

## 2024-05-25 NOTE — Progress Notes (Signed)
 Established Patient Office Visit  Subjective:  Patient ID: Adrienne [name], female    DOB: 06/26/1945  Age: 78 y.o. MRN: [id number]  CC:  Chief Complaint  Patient presents with   Hospitalization Follow-up    Hospital f/u     HPI Adelfa Lozito is a 78 y.o. female with past medical history of HTN, type 2 DM, nonishcemic CM, hypothyroidism, OA of hip and knee, HLD, breast ca. s/p right mastectomy and pancreatic carcinoma who presents for follow-up after recent ER visit.  She had EUS/ERCP on 05/10/24, after which she had bradycardia and elevated BP. She was sent to ER after the procedure. Her BP was 196/90 in the ER initially.  She was given a dose of losartan  50 mg and amlodipine  5 mg QD.  She was discharged home to resume her home medications.  Her BP had been elevated at home with losartan  50 mg QD and nifedipine  30 mg QD.  She has been taking losartan  100 mg QD recently.  Today, her BP was borderline elevated.  Denies any headache, dizziness, chest pain currently.  Past Medical History:  Diagnosis Date   Allergy    Ambulates with cane    straight cane   Arthritis    knee, back   CHF (congestive heart failure) (HCC)    Ductal carcinoma in situ of breast 12/2018   R Breast-mastectomy only   Gout    History of blood transfusion 1986   w/ Hysterectomy surgery   Hyperlipidemia    diet controlled - no meds   Hypertension    Hypothyroidism    Pelvic kidney    lower right pelvic kidney    SBO (small bowel obstruction) (HCC) 10/2016   surgery    Sleep apnea    Mild - no mask needed per sleep study   Smoker    quit smoking 2018   Type 2 diabetes mellitus (HCC)     Past Surgical History:  Procedure Laterality Date   ABDOMINAL HYSTERECTOMY  1986   COMPLETE-precancerous   APPENDECTOMY     pt states it was removed when gallbladder was removed.   AUGMENTATION MAMMAPLASTY     BACK SURGERY     lower back   BILIARY BRUSHING  07/08/2023   Procedure: BILIARY  BRUSHING;  Surgeon: Wilhelmenia Aloha Raddle., MD;  Location: [facility];  Service: Gastroenterology;;   BILIARY STENT PLACEMENT N/A 07/08/2023   Procedure: BILIARY STENT PLACEMENT;  Surgeon: Wilhelmenia Aloha Raddle., MD;  Location: [facility];  Service: Gastroenterology;  Laterality: N/A;   BIOPSY  06/14/2023   Procedure: BIOPSY;  Surgeon: Avram Lupita BRAVO, MD;  Location: [facility];  Service: Gastroenterology;;   BIOPSY  07/08/2023   Procedure: BIOPSY;  Surgeon: Wilhelmenia Aloha Raddle., MD;  Location: [facility];  Service: Gastroenterology;;   BREAST BIOPSY Right 05/12/2019   times 2   BREAST LUMPECTOMY WITH RADIOACTIVE SEED LOCALIZATION Right 03/18/2019   Procedure: RIGHT BREAST LUMPECTOMY WITH RADIOACTIVE SEED LOCALIZATION;  Surgeon: [name], MD;  Location: [facility];  Service: General;  Laterality: Right;   CHOLECYSTECTOMY     COLONOSCOPY  02/15/2008   Kaplan    COLONOSCOPY WITH PROPOFOL   02/27/2018   Dr.Nandigam   ECTOPIC PREGNANCY SURGERY     ERCP N/A 06/14/2023   Procedure: ENDOSCOPIC RETROGRADE CHOLANGIOPANCREATOGRAPHY (ERCP);  Surgeon: Avram Lupita BRAVO, MD;  Location: [facility];  Service: Gastroenterology;  Laterality: N/A;   ERCP N/A 06/27/2023   Procedure: ENDOSCOPIC RETROGRADE CHOLANGIOPANCREATOGRAPHY (ERCP);  Surgeon: Charlanne Groom,  MD;  [facility];  Service: Gastroenterology;  Laterality: N/A;   ERCP N/A 07/08/2023   Procedure: ENDOSCOPIC RETROGRADE CHOLANGIOPANCREATOGRAPHY (ERCP);  Surgeon: Wilhelmenia Aloha Raddle., MD;  Location: [facility];  Service: Gastroenterology;  Laterality: N/A;   ERCP N/A 05/10/2024   Procedure: ERCP, WITH INTERVENTION IF INDICATED;  Surgeon: Wilhelmenia Aloha Raddle., MD;  [facility];  Service: Gastroenterology;  Laterality: N/A;   ESOPHAGOGASTRODUODENOSCOPY N/A 07/08/2023   Procedure: ESOPHAGOGASTRODUODENOSCOPY (EGD);  Surgeon: Wilhelmenia Aloha Raddle., MD;  Location: [facility];  Service: Gastroenterology;  Laterality:  N/A;   ESOPHAGOGASTRODUODENOSCOPY N/A 05/10/2024   Procedure: EGD (ESOPHAGOGASTRODUODENOSCOPY);  Surgeon: Wilhelmenia Aloha Raddle., MD;  Location: [facility];  Service: Gastroenterology;  Laterality: N/A;   ESOPHAGOGASTRODUODENOSCOPY (EGD) WITH PROPOFOL  N/A 06/14/2023   Procedure: ESOPHAGOGASTRODUODENOSCOPY (EGD) WITH PROPOFOL ;  Surgeon: Avram Lupita BRAVO, MD;  [facility];  Service: Gastroenterology;  Laterality: N/A;   EUS N/A 07/08/2023   Procedure: UPPER ENDOSCOPIC ULTRASOUND (EUS) LINEAR;  Surgeon: Wilhelmenia Aloha Raddle., MD;  Location: [facility];  Service: Gastroenterology;  Laterality: N/A;   EUS N/A 05/10/2024   Procedure: ULTRASOUND, UPPER GI TRACT, ENDOSCOPIC;  Surgeon: Wilhelmenia Aloha Raddle., MD;  [facility];  Service: Gastroenterology;  Laterality: N/A;   FIDUCIAL MARKER PLACEMENT N/A 05/10/2024   Procedure: INSERTION, FIDUCIAL MARKERS;  Surgeon: Wilhelmenia Aloha Raddle., MD;  [facility];  Service: Gastroenterology;  Laterality: N/A;   FINE NEEDLE ASPIRATION N/A 07/08/2023   Procedure: FINE NEEDLE ASPIRATION (FNA) LINEAR;  Surgeon: Wilhelmenia Aloha Raddle., MD;  [facility];  Service: Gastroenterology;  Laterality: N/A;   IR IMAGING GUIDED PORT INSERTION  07/29/2023   JOINT REPLACEMENT     Left hip total Dr. Melodi 10-01-17   MALONEY DILATION  06/14/2023   Procedure: AGAPITO DILATION;  Surgeon: Avram Lupita BRAVO, MD;  Location: [facility];  Service: Gastroenterology;;   MASTECTOMY Right 07/26/2019   PANCREATIC STENT PLACEMENT  06/14/2023   Procedure: PANCREATIC STENT PLACEMENT;  Surgeon: Avram Lupita BRAVO, MD;  [facility]: [facility];  Service: Gastroenterology;;   PANCREATIC STENT PLACEMENT  06/27/2023   Procedure: PANCREATIC STENT PLACEMENT;  Surgeon: Charlanne Groom, MD;  Location: [facility];  Service: Gastroenterology;;   POLYPECTOMY     polypectomy-oropharynx     RE-EXCISION OF BREAST CANCER,SUPERIOR MARGINS Right 04/08/2019    Procedure: RE-EXCISION OF RIGHT BREAST ANTERIOR MARGINS;  Surgeon: Curvin Mt III, MD;  Location[facility];  Service: General;  Laterality: Right;   right knee meniscus     SBO  10/2016   small bowel obstruction   SIMPLE MASTECTOMY WITH AXILLARY SENTINEL NODE BIOPSY Right 07/26/2019   Procedure: RIGHT MASTECTOMY WITH SENTINEL NODE BIOPSY;  Surgeon: Curvin Mt MOULD, MD;  Location: [facility];  Service: General;  Laterality: Right;   SPHINCTEROTOMY  06/27/2023   Procedure: SPHINCTEROTOMY;  Surgeon: Charlanne Groom, MD;  Location: [facility];  Service: Gastroenterology;;   ANNETT  07/08/2023   Procedure: ANNETT;  Surgeon: Wilhelmenia Aloha Raddle., MD;  Location: [facility];  Service: Gastroenterology;;   TOTAL HIP ARTHROPLASTY Left 10/01/2017   Procedure: LEFT  TOTAL HIP ARTHROPLASTY ANTERIOR APPROACH;  Surgeon: Melodi Lerner, MD;  Location: [facility];  Service: Orthopedics;  Laterality: Left;   TOTAL HIP ARTHROPLASTY Right 03/11/2018   Procedure: RIGHT TOTAL HIP ARTHROPLASTY ANTERIOR APPROACH;  Surgeon: Melodi Lerner, MD;  Location[facility];  Service: Orthopedics;  Laterality: Right;    UPPER GASTROINTESTINAL ENDOSCOPY      Family History  Problem Relation Age of Onset  Cancer Sister    Diabetes Brother    Diabetes Brother    Hypertension Other    Breast cancer Sister    Colon cancer Neg Hx    Colon polyps Neg Hx    Rectal cancer Neg Hx    Stomach cancer Neg Hx     Social History   Socioeconomic History   Marital status: Widowed    Spouse name: Not on file   Number of children: 3   Years of education: 46   Highest education level: Some college, no degree  Occupational History   Occupation: retired  Tobacco Use   Smoking status: Former    Current packs/day: 0.00    Average packs/day: 1 pack/day for 32.0 years (32.0 ttl pk-yrs)    Types: E-cigarettes, Cigarettes    Start date: 11/20/1984    Quit date: 11/20/2016    Years since quitting: 7.5    Smokeless tobacco: Never   Tobacco comments:    Smoker since 1975 has quit and started back several times    As of 07/03/23: smoked 30 years, 1 PPD, quit 2018  Vaping Use   Vaping status: Former  Substance and Sexual Activity   Alcohol use: No   Drug use: No   Sexual activity: Not Currently    Birth control/protection: Surgical    Comment: Hysterectomy  Other Topics Concern   Not on file  Social History Narrative   Widow since 2006.Married previously for 25 years.Lives with grandson and 2 great grandchildren.Retired.Previously office work.   Social Drivers of Corporate Investment Banker Strain: Low Risk  (06/27/2021)   Overall Financial Resource Strain (CARDIA)    Difficulty of Paying Living Expenses: Not hard at all  Food Insecurity: No Food Insecurity (02/27/2024)   Hunger Vital Sign    Worried About Running Out of Food in the Last Year: Never true    Ran Out of Food in the Last Year: Never true  Transportation Needs: No Transportation Needs (02/27/2024)   PRAPARE - Administrator, Civil Service (Medical): No    Lack of Transportation (Non-Medical): No  Physical Activity: Inactive (06/27/2021)   Exercise Vital Sign    Days of Exercise per Week: 0 days    Minutes of Exercise per Session: 0 min  Stress: No Stress Concern Present (06/27/2021)   [employer] of Occupational Health - Occupational Stress Questionnaire    Feeling of Stress : Not at all  Social Connections: Moderately Integrated (02/27/2024)   Social Connection and Isolation Panel    Frequency of Communication with Friends and Family: More than three times a week    Frequency of Social Gatherings with Friends and Family: Once a week    Attends Religious Services: More than 4 times per year    Active Member of [employer] or Organizations: Yes    Attends Banker Meetings: More than 4 times per year    Marital Status: Widowed  Intimate Partner Violence: Not At Risk (02/27/2024)   Humiliation, Afraid,  Rape, and Kick questionnaire    Fear of Current or Ex-Partner: No    Emotionally Abused: No    Physically Abused: No    Sexually Abused: No    Outpatient Medications Prior to Visit  Medication Sig Dispense Refill   albuterol  (VENTOLIN  HFA) 108 (90 Base) MCG/ACT inhaler Inhale 2 puffs into the lungs every 6 (six) hours as needed for wheezing or shortness of breath. 8 g 2   bisacodyl  (DULCOLAX) 5 MG EC  tablet Take 2 tablets (10 mg total) by mouth every other day.     cetirizine  (ZYRTEC  ALLERGY) 10 MG tablet Take 1 tablet (10 mg total) by mouth daily. 30 tablet 1   fluticasone  (FLONASE ) 50 MCG/ACT nasal spray Place 2 sprays into both nostrils daily. 16 g 6   Fluticasone -Umeclidin-Vilant (TRELEGY ELLIPTA ) 100-62.5-25 MCG/ACT AEPB Inhale 1 Inhalation into the lungs daily in the afternoon. 1 each 1   furosemide  (LASIX ) 20 MG tablet Take 1 tablet (20 mg total) by mouth daily. 30 tablet 2   gabapentin  (NEURONTIN ) 100 MG capsule Take 1 capsule (100 mg total) by mouth at bedtime. 30 capsule 5   Glucose Blood (BLOOD GLUCOSE TEST STRIPS) STRP 1 each by In Vitro route in the morning, at noon, and at bedtime. May substitute to any manufacturer covered by patient's insurance. 100 strip 5   guaiFENesin  (ROBITUSSIN) 100 MG/5ML liquid Take 5 mLs by mouth every 4 (four) hours as needed for cough or to loosen phlegm. 120 mL 0   insulin  glargine (LANTUS ) 100 UNIT/ML injection Inject 10 Units into the skin at bedtime.     lidocaine -prilocaine  (EMLA ) cream Apply 1 Application topically as needed (port).     ondansetron  (ZOFRAN ) 4 MG tablet Take 1 tablet (4 mg total) by mouth every 8 (eight) hours as needed for nausea or vomiting. 15 tablet 0   polyethylene glycol powder (GLYCOLAX /MIRALAX ) 17 GM/SCOOP powder Take 17 g by mouth daily. Dissolve 1 capful (17g) in 4-8 ounces of liquid and take by mouth daily.     predniSONE  (DELTASONE ) 10 MG tablet Prednisone  40 mg p.o. once daily for 3 days, then 30 mg p.o. once daily  for 3 days, then 20 mg p.o. once daily for 3 days, then 10 mg p.o. once daily for 3 days and stop. 30 tablet 0   RELION PEN NEEDLES 31G X 6 MM MISC USE 1  THREE TIMES DAILY 100 each 0   senna-docusate (SENOKOT-S) 8.6-50 MG tablet Take 1 tablet by mouth at bedtime as needed for mild constipation. 30 tablet 0   thyroid  (ARMOUR) 90 MG tablet Take 1 tablet by mouth once daily 30 tablet 0   losartan  (COZAAR ) 100 MG tablet Take 0.5 tablets (50 mg total) by mouth daily.     NIFEdipine  (ADALAT  CC) 30 MG 24 hr tablet Take 1 tablet (30 mg total) by mouth daily. 30 tablet 1   No facility-administered medications prior to visit.    Allergies  Allergen Reactions   Lisinopril  Swelling    Angioedema   Oxaliplatin  Shortness Of Breath and Swelling   Statins     Myalgias   Atorvastatin  Other (See Comments)    Muscle aches     ROS Review of Systems  Constitutional:  Negative for chills and fever.  HENT:  Negative for congestion and postnasal drip.   Eyes:  Negative for pain and discharge.  Respiratory:  Negative for cough and shortness of breath.   Cardiovascular:  Negative for chest pain and palpitations.  Gastrointestinal:  Negative for abdominal pain, diarrhea, nausea and vomiting.  Endocrine: Negative for polydipsia and polyuria.  Genitourinary:  Negative for dysuria and hematuria.  Musculoskeletal:  Positive for arthralgias. Negative for neck pain and neck stiffness.  Skin:  Negative for rash.  Neurological:  Negative for dizziness and weakness.  Psychiatric/Behavioral:  Negative for agitation and behavioral problems.       Objective:    Physical Exam Vitals reviewed.  Constitutional:      General:  She is not in acute distress.    Appearance: She is obese. She is not diaphoretic.  HENT:     Head: Normocephalic and atraumatic.     Nose: No congestion.     Mouth/Throat:     Mouth: Mucous membranes are moist.     Pharynx: No posterior oropharyngeal erythema.  Eyes:     General: No  scleral icterus.    Extraocular Movements: Extraocular movements intact.  Cardiovascular:     Rate and Rhythm: Normal rate and regular rhythm.     Heart sounds: Normal heart sounds. No murmur heard. Pulmonary:     Breath sounds: Normal breath sounds. No wheezing or rales.  Musculoskeletal:     Cervical back: Neck supple. No tenderness.     Right lower leg: No edema.     Left lower leg: No edema.  Skin:    General: Skin is warm.  Neurological:     General: No focal deficit present.     Mental Status: She is alert and oriented to person, place, and time.  Psychiatric:        Mood and Affect: Mood normal.        Behavior: Behavior normal.     BP 138/84 (BP Location: Left Arm)   Pulse 60   Ht 5' 3 (1.6 m)   Wt 147 lb 12.8 oz (67 kg)   SpO2 97%   BMI 26.18 kg/m  Wt Readings from Last 3 Encounters:  05/25/24 147 lb 12.8 oz (67 kg)  05/10/24 143 lb 8.3 oz (65.1 kg)  04/12/24 143 lb 8 oz (65.1 kg)    Lab Results  Component Value Date   TSH 9.380 (H) 02/24/2024   Lab Results  Component Value Date   WBC 8.3 05/10/2024   HGB 12.1 05/10/2024   HCT 37.4 05/10/2024   MCV 95.7 05/10/2024   PLT 243 05/10/2024   Lab Results  Component Value Date   NA 139 05/10/2024   K 3.8 05/10/2024   CO2 23 05/10/2024   GLUCOSE 170 (H) 05/10/2024   BUN 19 05/10/2024   CREATININE 0.80 05/10/2024   BILITOT 0.4 05/10/2024   ALKPHOS 123 05/10/2024   AST 63 (H) 05/10/2024   ALT 29 05/10/2024   PROT 8.9 (H) 05/10/2024   ALBUMIN 4.5 05/10/2024   CALCIUM  10.3 05/10/2024   ANIONGAP 14 05/10/2024   EGFR 42 (L) 06/24/2023   GFR 74.79 07/15/2013   Lab Results  Component Value Date   CHOL 219 (H) 10/10/2022   Lab Results  Component Value Date   HDL 53 10/10/2022   Lab Results  Component Value Date   LDLCALC 145 (H) 10/10/2022   Lab Results  Component Value Date   TRIG 118 10/10/2022   Lab Results  Component Value Date   CHOLHDL 4.1 10/10/2022   Lab Results  Component Value  Date   HGBA1C 6.1 (H) 02/24/2024      Assessment & Plan:   Problem List Items Addressed This Visit       Cardiovascular and Mediastinum   Essential hypertension   BP Readings from Last 1 Encounters:  05/25/24 138/84   Has been elevated at home Uncontrolled with Losartan  100 mg QD and nifedipine  30 mg QD Dose of nifedipine  was recently reduced during hospitalization Increased dose of nifedipine  to 60 mg QD for now Counseled for compliance with the medications Advised DASH diet and moderate exercise/walking, at least 150 mins/week      Relevant Medications   losartan  (  COZAAR ) 100 MG tablet   NIFEdipine  (ADALAT  CC) 60 MG 24 hr tablet   Nonischemic cardiomyopathy (HCC)   On Losartan  Had to stop Coreg  due to bradycardia Appears euvolemic currently Followed by Cardiology      Relevant Medications   losartan  (COZAAR ) 100 MG tablet   NIFEdipine  (ADALAT  CC) 60 MG 24 hr tablet     Digestive   Malignant neoplasm of head of pancreas (HCC)   Followed by GI and oncology - undergoing chemotherapy      Biliary stricture (HCC)   S/p ERCP with stent placement on 05/10/24 Patient has symptomatically improved now Followed by GI        Endocrine   Type 2 diabetes mellitus with other specified complication [facility])   Lab Results  Component Value Date   HGBA1C 6.1 (H) 02/24/2024    POC HbA1c: 7.2 in 04/25, improved than prior Well-controlled for her age, check HbA1c On Basaglar  10 units nightly, has not needed ISS recently Associated with HTN and HLD Metformin  discontinued from recent hospitalization Avoid sulfonylureas or incretin analogs due to pancreatic mass Advised to follow diabetic diet On ARB F/u CMP and lipid panel Diabetic eye exam: Advised to follow up with Ophthalmology for diabetic eye exam      Relevant Medications   losartan  (COZAAR ) 100 MG tablet   Other Relevant Orders   Microalbumin / creatinine urine ratio     Nervous and Auditory   Peripheral  neuropathy due to chemotherapy   Burning sensation and tingling in bilateral hands and feet likely due to chemotherapy induced peripheral neuropathy Had discussed about possible treatment options in the last visit, but she has not started it yet - has persistent/worsening symptoms Needs to start gabapentin  100 mg nightly  - plan to increase dose as tolerated        Other   Encounter for examination following treatment at hospital - Primary   ER chart reviewed, including imaging Had hypertensive urgency, resolved now       Meds ordered this encounter  Medications   losartan  (COZAAR ) 100 MG tablet    Sig: Take 1 tablet (100 mg total) by mouth daily.    Dispense:  90 tablet    Refill:  3   NIFEdipine  (ADALAT  CC) 60 MG 24 hr tablet    Sig: Take 1 tablet (60 mg total) by mouth daily.    Dispense:  90 tablet    Refill:  3    Dose change - 05/25/24, Please cancel Nifedipine  30 mg prescription.    Follow-up: No follow-ups on file.    Suzzane MARLA Blanch, MD

## 2024-05-25 NOTE — Patient Instructions (Signed)
 Please start taking Losartan  100 mg once daily and Nifedipine  60 mg once daily.  Please continue to take medications as prescribed.  Please continue to follow low carb diet and ambulate as tolerated.

## 2024-05-25 NOTE — Assessment & Plan Note (Signed)
 S/p ERCP with stent placement on 05/10/24 Patient has symptomatically improved now Followed by GI

## 2024-05-25 NOTE — Assessment & Plan Note (Addendum)
 Lab Results  Component Value Date   HGBA1C 6.1 (H) 02/24/2024    POC HbA1c: 7.2 in 04/25, improved than prior Well-controlled for her age, check HbA1c On Basaglar  10 units nightly, has not needed ISS recently Associated with HTN and HLD Metformin  discontinued from recent hospitalization Avoid sulfonylureas or incretin analogs due to pancreatic mass Advised to follow diabetic diet On ARB F/u CMP and lipid panel Diabetic eye exam: Advised to follow up with Ophthalmology for diabetic eye exam

## 2024-05-25 NOTE — Assessment & Plan Note (Addendum)
 BP Readings from Last 1 Encounters:  05/25/24 138/84   Has been elevated at home Uncontrolled with Losartan  100 mg QD and nifedipine  30 mg QD Dose of nifedipine  was recently reduced during hospitalization Increased dose of nifedipine  to 60 mg QD for now Counseled for compliance with the medications Advised DASH diet and moderate exercise/walking, at least 150 mins/week

## 2024-05-25 NOTE — Assessment & Plan Note (Signed)
 Burning sensation and tingling in bilateral hands and feet likely due to chemotherapy induced peripheral neuropathy Had discussed about possible treatment options in the last visit, but she has not started it yet - has persistent/worsening symptoms Needs to start gabapentin  100 mg nightly  - plan to increase dose as tolerated

## 2024-05-25 NOTE — Assessment & Plan Note (Signed)
 ER chart reviewed, including imaging Had hypertensive urgency, resolved now

## 2024-05-25 NOTE — Assessment & Plan Note (Signed)
 On Losartan  Had to stop Coreg  due to bradycardia Appears euvolemic currently Followed by Cardiology

## 2024-05-27 ENCOUNTER — Telehealth: Payer: Self-pay | Admitting: Internal Medicine

## 2024-05-27 LAB — MICROALBUMIN / CREATININE URINE RATIO
Creatinine, Urine: 221.9 mg/dL
Microalb/Creat Ratio: 17 mg/g{creat} (ref 0–29)
Microalbumin, Urine: 38.8 ug/mL

## 2024-05-27 NOTE — Telephone Encounter (Signed)
 Copied from CRM #[id number]. Topic: General - Other >> May 27, 2024  9:34 AM Tinnie BROCKS wrote: Reason for CRM: Patient states she received a call from [employer] to let her know that a form was sent to Dr. Anthony office and they have not received it back yet. She does not know when it was faxed, just states theres information that Dr. Tobie needs to fill out. I could not find anything in her chart. Here is their contact information:  [facility] [phone] Fax [phone]

## 2024-05-28 ENCOUNTER — Ambulatory Visit
Admission: RE | Admit: 2024-05-28 | Discharge: 2024-05-28 | Attending: Radiation Oncology | Admitting: Radiation Oncology

## 2024-05-28 ENCOUNTER — Ambulatory Visit: Admission: RE | Admit: 2024-05-28 | Discharge: 2024-05-28 | Attending: Radiation Oncology

## 2024-05-28 VITALS — BP 187/89 | HR 58 | Temp 97.8°F | Resp 20 | Wt 146.0 lb

## 2024-05-28 DIAGNOSIS — C250 Malignant neoplasm of head of pancreas: Secondary | ICD-10-CM | POA: Insufficient documentation

## 2024-05-28 DIAGNOSIS — D0511 Intraductal carcinoma in situ of right breast: Secondary | ICD-10-CM

## 2024-05-28 DIAGNOSIS — Z510 Encounter for antineoplastic radiation therapy: Secondary | ICD-10-CM | POA: Insufficient documentation

## 2024-05-28 MED ORDER — SODIUM CHLORIDE 0.9% FLUSH
20.0000 mL | Freq: Once | INTRAVENOUS | Status: AC
  Administered 2024-05-28: 20 mL via INTRAVENOUS

## 2024-05-28 NOTE — Addendum Note (Signed)
 Encounter addended by: Albino Dyke FALCON, LPN on: 87/09/7972 11:50 AM  Actions taken: Flowsheet accepted

## 2024-05-28 NOTE — Telephone Encounter (Signed)
 Attempted to contact [facility] she had no idea what type of form , per [facility] typically does these type of forms, if anything else is needed

## 2024-05-28 NOTE — Telephone Encounter (Signed)
Pt will let us know.

## 2024-05-31 ENCOUNTER — Inpatient Hospital Stay: Payer: Self-pay | Admitting: Internal Medicine

## 2024-06-01 ENCOUNTER — Telehealth: Payer: Self-pay | Admitting: Pharmacy Technician

## 2024-06-01 NOTE — Telephone Encounter (Signed)
   This assistance application is scanned in media under leqvio  patient assistance. If someone can please get the provider to sign and fax to [phone]. Thank you

## 2024-06-02 NOTE — Telephone Encounter (Signed)
 Paperwork given to Dr. Lavona 12/10

## 2024-06-03 NOTE — Telephone Encounter (Signed)
 Paperwork faxed 12/11

## 2024-06-04 ENCOUNTER — Other Ambulatory Visit: Payer: Self-pay | Admitting: Internal Medicine

## 2024-06-04 DIAGNOSIS — E039 Hypothyroidism, unspecified: Secondary | ICD-10-CM

## 2024-06-11 DIAGNOSIS — Z510 Encounter for antineoplastic radiation therapy: Secondary | ICD-10-CM | POA: Diagnosis not present

## 2024-06-14 ENCOUNTER — Other Ambulatory Visit: Payer: Self-pay

## 2024-06-14 ENCOUNTER — Ambulatory Visit
Admission: RE | Admit: 2024-06-14 | Discharge: 2024-06-14 | Attending: Radiation Oncology | Admitting: Radiation Oncology

## 2024-06-14 DIAGNOSIS — Z510 Encounter for antineoplastic radiation therapy: Secondary | ICD-10-CM | POA: Diagnosis not present

## 2024-06-14 LAB — RAD ONC ARIA SESSION SUMMARY
Course Elapsed Days: 0
Plan Fractions Treated to Date: 1
Plan Prescribed Dose Per Fraction: 6.6 Gy
Plan Total Fractions Prescribed: 5
Plan Total Prescribed Dose: 33 Gy
Reference Point Dosage Given to Date: 6.6 Gy
Reference Point Session Dosage Given: 6.6 Gy
Session Number: 1

## 2024-06-15 ENCOUNTER — Ambulatory Visit: Admitting: Radiation Oncology

## 2024-06-16 ENCOUNTER — Ambulatory Visit
Admission: RE | Admit: 2024-06-16 | Discharge: 2024-06-16 | Disposition: A | Source: Ambulatory Visit | Attending: Radiation Oncology | Admitting: Radiation Oncology

## 2024-06-16 ENCOUNTER — Other Ambulatory Visit: Payer: Self-pay

## 2024-06-16 DIAGNOSIS — Z510 Encounter for antineoplastic radiation therapy: Secondary | ICD-10-CM | POA: Diagnosis not present

## 2024-06-16 LAB — RAD ONC ARIA SESSION SUMMARY
Course Elapsed Days: 2
Plan Fractions Treated to Date: 2
Plan Prescribed Dose Per Fraction: 6.6 Gy
Plan Total Fractions Prescribed: 5
Plan Total Prescribed Dose: 33 Gy
Reference Point Dosage Given to Date: 13.2 Gy
Reference Point Session Dosage Given: 6.6 Gy
Session Number: 2

## 2024-06-21 ENCOUNTER — Ambulatory Visit
Admission: RE | Admit: 2024-06-21 | Discharge: 2024-06-21 | Disposition: A | Discharge status: Home / Self Care | Source: Ambulatory Visit | Attending: Radiation Oncology | Admitting: Radiation Oncology

## 2024-06-21 ENCOUNTER — Other Ambulatory Visit: Payer: Self-pay

## 2024-06-21 DIAGNOSIS — Z510 Encounter for antineoplastic radiation therapy: Secondary | ICD-10-CM | POA: Diagnosis not present

## 2024-06-21 LAB — RAD ONC ARIA SESSION SUMMARY
Course Elapsed Days: 7
Plan Fractions Treated to Date: 3
Plan Prescribed Dose Per Fraction: 6.6 Gy
Plan Total Fractions Prescribed: 5
Plan Total Prescribed Dose: 33 Gy
Reference Point Dosage Given to Date: 19.8 Gy
Reference Point Session Dosage Given: 6.6 Gy
Session Number: 3

## 2024-06-22 ENCOUNTER — Other Ambulatory Visit: Payer: Self-pay | Admitting: Internal Medicine

## 2024-06-22 ENCOUNTER — Ambulatory Visit: Admitting: Radiation Oncology

## 2024-06-23 ENCOUNTER — Ambulatory Visit
Admission: RE | Admit: 2024-06-23 | Discharge: 2024-06-23 | Disposition: A | Discharge status: Home / Self Care | Source: Ambulatory Visit | Attending: Radiation Oncology | Admitting: Radiation Oncology

## 2024-06-23 ENCOUNTER — Other Ambulatory Visit: Payer: Self-pay

## 2024-06-23 DIAGNOSIS — Z510 Encounter for antineoplastic radiation therapy: Secondary | ICD-10-CM | POA: Diagnosis not present

## 2024-06-23 LAB — RAD ONC ARIA SESSION SUMMARY
Course Elapsed Days: 9
Plan Fractions Treated to Date: 4
Plan Prescribed Dose Per Fraction: 6.6 Gy
Plan Total Fractions Prescribed: 5
Plan Total Prescribed Dose: 33 Gy
Reference Point Dosage Given to Date: 26.4 Gy
Reference Point Session Dosage Given: 6.6 Gy
Session Number: 4

## 2024-06-24 ENCOUNTER — Telehealth: Payer: Self-pay

## 2024-06-24 NOTE — Telephone Encounter (Signed)
 Auth Submission: NO AUTH NEEDED Site of care: Site of care: [facility] Payer: [employer] Medication & CPT/J Code(s) submitted: Le[employer]  (Inclisiran) J1306 Diagnosis Code:  Route of submission (phone, fax, portal): portal Phone # Fax # Auth type: Buy/Bill PB Units/visits requested: 284mg  x 2 doses Reference number:  Approval from: 06/24/24 to 06/23/25  PAP enrollment for 2026 has been approved as well.

## 2024-06-25 ENCOUNTER — Ambulatory Visit
Admission: RE | Admit: 2024-06-25 | Discharge: 2024-06-25 | Disposition: A | Discharge status: Home / Self Care | Source: Ambulatory Visit | Attending: Radiation Oncology | Admitting: Radiation Oncology

## 2024-06-25 ENCOUNTER — Other Ambulatory Visit: Payer: Self-pay

## 2024-06-25 DIAGNOSIS — C250 Malignant neoplasm of head of pancreas: Secondary | ICD-10-CM | POA: Insufficient documentation

## 2024-06-25 DIAGNOSIS — Z510 Encounter for antineoplastic radiation therapy: Secondary | ICD-10-CM | POA: Diagnosis present

## 2024-06-25 LAB — RAD ONC ARIA SESSION SUMMARY
Course Elapsed Days: 11
Plan Fractions Treated to Date: 5
Plan Prescribed Dose Per Fraction: 6.6 Gy
Plan Total Fractions Prescribed: 5
Plan Total Prescribed Dose: 33 Gy
Reference Point Dosage Given to Date: 33 Gy
Reference Point Session Dosage Given: 6.6 Gy
Session Number: 5

## 2024-06-28 NOTE — Radiation Completion Notes (Signed)
 Patient Name: Adrienne [name], BRASIL MRN: [id number] Date of Birth: Jun 15, 1946 Referring Physician: ONITA MATTOCK, M.D. Date of Service: 2024-06-28 Radiation Oncologist: Norleen Limes, M.D. [facility]                             RADIATION ONCOLOGY END OF TREATMENT NOTE     Diagnosis: C25.0 Malignant neoplasm of head of pancreas Staging on 2019-07-26: Ductal carcinoma in situ (DCIS) of right breast T=pTis (DCIS), N=pN0, M=cM0 Staging on 2019-02-02: Ductal carcinoma in situ (DCIS) of right breast T=cTis (DCIS), N=cN0, M=cM0 Staging on 2022-07-07: Malignant neoplasm of head of pancreas (HCC) T=cT2, N=cN0, M=cM0 Intent: Curative     ==========DELIVERED PLANS==========  First Treatment Date: 2024-06-14 Last Treatment Date: 2024-06-25   Plan Name: Pancreas_SBRT Site: Pancreas Technique: SBRT/SRT-IMRT Mode: Photon Dose Per Fraction: 6.6 Gy Prescribed Dose (Delivered / Prescribed): 33 Gy / 33 Gy Prescribed Fxs (Delivered / Prescribed): 5 / 5     ==========ON TREATMENT VISIT DATES========== 2024-06-14, 2024-06-16, 2024-06-21, 2024-06-23, 2024-06-25, 2024-06-25     ==========UPCOMING VISITS========== 07/29/2024 [facility]  06/29/2024 [facility] OFFICE VISIT [name], MD        ==========APPENDIX - ON TREATMENT VISIT NOTES==========   See weekly On Treatment Notes in Epic for details in the Media tab (listed as Progress notes on the On Treatment Visit Dates listed above).

## 2024-06-29 ENCOUNTER — Encounter: Payer: Self-pay | Admitting: Internal Medicine

## 2024-06-29 ENCOUNTER — Ambulatory Visit (INDEPENDENT_AMBULATORY_CARE_PROVIDER_SITE_OTHER): Admitting: Internal Medicine

## 2024-06-29 VITALS — BP 144/80 | HR 60 | Wt 142.6 lb

## 2024-06-29 DIAGNOSIS — E782 Mixed hyperlipidemia: Secondary | ICD-10-CM

## 2024-06-29 DIAGNOSIS — E039 Hypothyroidism, unspecified: Secondary | ICD-10-CM | POA: Diagnosis not present

## 2024-06-29 DIAGNOSIS — C250 Malignant neoplasm of head of pancreas: Secondary | ICD-10-CM

## 2024-06-29 DIAGNOSIS — I428 Other cardiomyopathies: Secondary | ICD-10-CM

## 2024-06-29 DIAGNOSIS — I10 Essential (primary) hypertension: Secondary | ICD-10-CM | POA: Diagnosis not present

## 2024-06-29 DIAGNOSIS — E1169 Type 2 diabetes mellitus with other specified complication: Secondary | ICD-10-CM | POA: Diagnosis not present

## 2024-06-29 DIAGNOSIS — J439 Emphysema, unspecified: Secondary | ICD-10-CM | POA: Diagnosis not present

## 2024-06-29 DIAGNOSIS — J019 Acute sinusitis, unspecified: Secondary | ICD-10-CM | POA: Diagnosis not present

## 2024-06-29 DIAGNOSIS — Z794 Long term (current) use of insulin: Secondary | ICD-10-CM

## 2024-06-29 NOTE — Assessment & Plan Note (Addendum)
 BP Readings from Last 1 Encounters:  06/29/24 (!) 144/80   Elevated today, but has not taken her medicine today Usually well-controlled with Losartan  100 mg QD and nifedipine  60 mg QD now Counseled for compliance with the medications Advised DASH diet and moderate exercise/walking as tolerated

## 2024-06-29 NOTE — Assessment & Plan Note (Addendum)
 On Losartan  Had to stop Coreg  due to bradycardia Appears euvolemic currently, discontinue Lasix  as she has not been taking it anyways Followed by Cardiology

## 2024-06-29 NOTE — Assessment & Plan Note (Signed)
 Lab Results  Component Value Date   HGBA1C 6.1 (H) 02/24/2024   Improved compared to prior Well-controlled for her age, check HbA1c On Basaglar  10 units nightly, has not needed ISS recently Associated with HTN and HLD Metformin  discontinued from recent hospitalization Avoid sulfonylureas or incretin analogs due to pancreatic mass Advised to follow diabetic diet On ARB F/u CMP and lipid panel Diabetic eye exam: Advised to follow up with Ophthalmology for diabetic eye exam

## 2024-06-29 NOTE — Progress Notes (Signed)
 "  Established Patient Office Visit  Subjective:  Patient ID: Adrienne [name], female    DOB: [birth year]  Age: 79 y.o. MRN: [id number]  CC:  Chief Complaint  Patient presents with   Hypertension    Follow up    Cough    Reports sx of coughing and sneezing since Thursday 06/24/24    HPI Adrienne [name] is a 79 y.o. female with past medical history of HTN, type 2 DM, nonishcemic CM, hypothyroidism, OA of hip and knee, HLD, breast ca. s/p right mastectomy who presents for f/u of her chronic medical conditions.  HTN: BP is elevated today, but has not taken her medications today. Takes Losartan  100 mg QD and nifedipine  60 mg QD regularly. Coreg  was discontinued due to bradycardia. Patient denies headache, dizziness, chest pain, dyspnea or palpitations. She has stopped Lasix  now, denies leg swelling or overt exertional dyspnea.  Hypothyroidism: She takes NP thyroid  90 mg daily currently.  She has history of oversupplemented thyroid  in the past, but recent TSH was elevated.  Her appetite has reduced recently, likely due to chemotherapy.  She has lost about 5 lbs since the last visit.  Type 2 DM: Her HbA1c was 6.1 in 09/25, better than prior - 8.1 in 12/24.  She has been placed on Basaglar  10 units nightly.  Her blood glucose have been better controlled, around 80-140 now.  She has not needed mealtime insulin  recently.  Denies any polyuria or polydipsia currently.  Pancreatic adenocarcinoma: She has completed chemotherapy.  She still complains of lack of appetite and intermittent tingling of bilateral LE.  She did not start gabapentin .  Denies overt nausea or vomiting.  Denies chronic diarrhea.  Nonischemic CM: She has been evaluated by cardiology.  She had echocardiogram in 03/24, which showed MR with mild MVP.  HLD: She has hot intolerance to statins.  Her LDL was 145 in 04/24.  She had Pharm.D. visit for PCSK9 inhibitor therapy.  She was approved for Leqvio , which had been held due  to ongoing chemotherapy.  She complains of nasal congestion, sore throat and dry cough for the last 5 days.  She reports improvement in cough and sneezing since yesterday.  Denies any fever or chills.  Denies any recent worsening of dyspnea and wheezing.   Past Medical History:  Diagnosis Date   Allergy    Ambulates with cane    straight cane   Arthritis    knee, back   CHF (congestive heart failure) (HCC)    Ductal carcinoma in situ of breast 12/2018   R Breast-mastectomy only   Gout    History of blood transfusion 1986   w/ Hysterectomy surgery   Hyperlipidemia    diet controlled - no meds   Hypertension    Hypothyroidism    Pelvic kidney    lower right pelvic kidney    SBO (small bowel obstruction) (HCC) 10/2016   surgery    Sleep apnea    Mild - no mask needed per sleep study   Smoker    quit smoking 2018   Type 2 diabetes mellitus (HCC)     Past Surgical History:  Procedure Laterality Date   ABDOMINAL HYSTERECTOMY  1986   COMPLETE-precancerous   APPENDECTOMY     pt states it was removed when gallbladder was removed.   AUGMENTATION MAMMAPLASTY     BACK SURGERY     lower back   BILIARY BRUSHING  07/08/2023   Procedure: BILIARY BRUSHING;  Surgeon: Wilhelmenia Roers  Mickey., MD;  Location: [facility];  Service: Gastroenterology;;   BILIARY STENT PLACEMENT N/A 07/08/2023   Procedure: BILIARY STENT PLACEMENT;  Surgeon: Wilhelmenia Aloha Mickey., MD;  Location: [facility];  Service: Gastroenterology;  Laterality: N/A;   BIOPSY  06/14/2023   Procedure: BIOPSY;  Surgeon: Avram Lupita BRAVO, MD;  Location: [facility];  Service: Gastroenterology;;   BIOPSY  07/08/2023   Procedure: BIOPSY;  Surgeon: Wilhelmenia Aloha Mickey., MD;  Location: [facility];  Service: Gastroenterology;;   BREAST BIOPSY Right 05/12/2019   times 2   BREAST LUMPECTOMY WITH RADIOACTIVE SEED LOCALIZATION Right 03/18/2019   Procedure: RIGHT BREAST LUMPECTOMY WITH RADIOACTIVE SEED LOCALIZATION;  Surgeon:  [name], MD;  Location: [facility];  Service: General;  Laterality: Right;   CHOLECYSTECTOMY     COLONOSCOPY  02/15/2008   Kaplan    COLONOSCOPY WITH PROPOFOL   02/27/2018   Dr.Nandigam   ECTOPIC PREGNANCY SURGERY     ERCP N/A 06/14/2023   Procedure: ENDOSCOPIC RETROGRADE CHOLANGIOPANCREATOGRAPHY (ERCP);  Surgeon: Avram Lupita BRAVO, MD;  Location: [facility];  Service: Gastroenterology;  Laterality: N/A;   ERCP N/A 06/27/2023   Procedure: ENDOSCOPIC RETROGRADE CHOLANGIOPANCREATOGRAPHY (ERCP);  Surgeon: Charlanne Groom, MD;  Location: [facility];  Service: Gastroenterology;  Laterality: N/A;   ERCP N/A 07/08/2023   Procedure: ENDOSCOPIC RETROGRADE CHOLANGIOPANCREATOGRAPHY (ERCP);  Surgeon: Wilhelmenia Aloha Mickey., MD;  Location: [facility];  Service: Gastroenterology;  Laterality: N/A;   ERCP N/A 05/10/2024   Procedure: ERCP, WITH INTERVENTION IF INDICATED;  Surgeon: Wilhelmenia Aloha Mickey., MD;  Location: [facility];  Service: Gastroenterology;  Laterality: N/A;   ESOPHAGOGASTRODUODENOSCOPY N/A 07/08/2023   Procedure: ESOPHAGOGASTRODUODENOSCOPY (EGD);  Surgeon: Wilhelmenia Aloha Mickey., MD;  Location: [facility];  Service: Gastroenterology;  Laterality: N/A;   ESOPHAGOGASTRODUODENOSCOPY N/A 05/10/2024   Procedure: EGD (ESOPHAGOGASTRODUODENOSCOPY);  Surgeon: Wilhelmenia Aloha Mickey., MD;  Location: [facility];  Service: Gastroenterology;  Laterality: N/A;   ESOPHAGOGASTRODUODENOSCOPY (EGD) WITH PROPOFOL  N/A 06/14/2023   Procedure: ESOPHAGOGASTRODUODENOSCOPY (EGD) WITH PROPOFOL ;  Surgeon: Avram Lupita BRAVO, MD;  Location: [facility];  Service: Gastroenterology;  Laterality: N/A;   EUS N/A 07/08/2023   Procedure: UPPER ENDOSCOPIC ULTRASOUND (EUS) LINEAR;  Surgeon: Wilhelmenia Aloha Mickey., MD;  Location: [facility];  Service: Gastroenterology;  Laterality: N/A;   EUS N/A 05/10/2024   Procedure: ULTRASOUND, UPPER GI TRACT, ENDOSCOPIC;  Surgeon: Wilhelmenia Aloha Mickey., MD;  Location: [facility];  Service: Gastroenterology;  Laterality: N/A;   FIDUCIAL MARKER PLACEMENT N/A 05/10/2024   Procedure: INSERTION, FIDUCIAL MARKERS;  Surgeon: Wilhelmenia Aloha Mickey., MD;  Location: [facility];  Service: Gastroenterology;  Laterality: N/A;   FINE NEEDLE ASPIRATION N/A 07/08/2023   Procedure: FINE NEEDLE ASPIRATION (FNA) LINEAR;  Surgeon: Wilhelmenia Aloha Mickey., MD;  Location: [facility];  Service: Gastroenterology;  Laterality: N/A;   IR IMAGING GUIDED PORT INSERTION  07/29/2023   JOINT REPLACEMENT     Left hip total Dr. Melodi 10-01-17   MALONEY DILATION  06/14/2023   Procedure: AGAPITO DILATION;  Surgeon: Avram Lupita BRAVO, MD;  Location: [facility];  Service: Gastroenterology;;   MASTECTOMY Right 07/26/2019   PANCREATIC STENT PLACEMENT  06/14/2023   Procedure: PANCREATIC STENT PLACEMENT;  Surgeon: Avram Lupita BRAVO, MD;  Location: [facility];  Service: Gastroenterology;;   PANCREATIC STENT PLACEMENT  06/27/2023   Procedure: PANCREATIC STENT PLACEMENT;  Surgeon: Charlanne Groom, MD;  Location: [facility];  Service: Gastroenterology;;   POLYPECTOMY     polypectomy-oropharynx     RE-EXCISION OF BREAST CANCER,SUPERIOR MARGINS Right 04/08/2019   Procedure: RE-EXCISION  OF RIGHT BREAST ANTERIOR MARGINS;  Surgeon: Curvin Mt III, MD;  Location: [facility];  Service: General;  Laterality: Right;   right knee meniscus     SBO  10/2016   small bowel obstruction   SIMPLE MASTECTOMY WITH AXILLARY SENTINEL NODE BIOPSY Right 07/26/2019   Procedure: RIGHT MASTECTOMY WITH SENTINEL NODE BIOPSY;  Surgeon: Curvin Mt MOULD, MD;  Location: [facility];  Service: General;  Laterality: Right;   SPHINCTEROTOMY  06/27/2023   Procedure: SPHINCTEROTOMY;  Surgeon: Charlanne Groom, MD;  Location: [facility];  Service: Gastroenterology;;   ANNETT  07/08/2023   Procedure: ANNETT;  Surgeon: Wilhelmenia Aloha Raddle., MD;  Location: [facility];  Service: Gastroenterology;;   TOTAL HIP  ARTHROPLASTY Left 10/01/2017   Procedure: LEFT  TOTAL HIP ARTHROPLASTY ANTERIOR APPROACH;  Surgeon: Melodi Lerner, MD;  Location: [facility];  Service: Orthopedics;  Laterality: Left;   TOTAL HIP ARTHROPLASTY Right 03/11/2018   Procedure: RIGHT TOTAL HIP ARTHROPLASTY ANTERIOR APPROACH;  Surgeon: Melodi Lerner, MD;  Location: [facility];  Service: Orthopedics;  Laterality: Right;    UPPER GASTROINTESTINAL ENDOSCOPY      Family History  Problem Relation Age of Onset   Cancer Sister    Diabetes Brother    Diabetes Brother    Hypertension Other    Breast cancer Sister    Colon cancer Neg Hx    Colon polyps Neg Hx    Rectal cancer Neg Hx    Stomach cancer Neg Hx     Social History   Socioeconomic History   Marital status: Widowed    Spouse name: Not on file   Number of children: 3   Years of education: 31   Highest education level: Some college, no degree  Occupational History   Occupation: retired  Tobacco Use   Smoking status: Former    Current packs/day: 0.00    Average packs/day: 1 pack/day for 32.0 years (32.0 ttl pk-yrs)    Types: E-cigarettes, Cigarettes    Start date: 11/20/1984    Quit date: 11/20/2016    Years since quitting: 7.6   Smokeless tobacco: Never   Tobacco comments:    Smoker since 1975 has quit and started back several times    As of 07/03/23: smoked 30 years, 1 PPD, quit 2018  Vaping Use   Vaping status: Former  Substance and Sexual Activity   Alcohol use: No   Drug use: No   Sexual activity: Not Currently    Birth control/protection: Surgical    Comment: Hysterectomy  Other Topics Concern   Not on file  Social History Narrative   Widow since 2006.Married previously for 25 years.Lives with grandson and 2 great grandchildren.Retired.Previously office work.   Social Drivers of Health   Tobacco Use: Medium Risk (06/29/2024)   Patient History    Smoking Tobacco Use: Former    Smokeless Tobacco Use: Never    Passive Exposure: Not on file  Financial  Resource Strain: Low Risk (06/27/2021)   Overall Financial Resource Strain (CARDIA)    Difficulty of Paying Living Expenses: Not hard at all  Food Insecurity: No Food Insecurity (02/27/2024)   Epic    Worried About Programme Researcher, Broadcasting/film/video in the Last Year: Never true    Ran Out of Food in the Last Year: Never true  Transportation Needs: No Transportation Needs (02/27/2024)   Epic    Lack of Transportation (Medical): No    Lack of Transportation (Non-Medical): No  Physical Activity: Inactive (06/27/2021)  Exercise Vital Sign    Days of Exercise per Week: 0 days    Minutes of Exercise per Session: 0 min  Stress: No Stress Concern Present (06/27/2021)   [employer] of Occupational Health - Occupational Stress Questionnaire    Feeling of Stress : Not at all  Social Connections: Moderately Integrated (02/27/2024)   Social Connection and Isolation Panel    Frequency of Communication with Friends and Family: More than three times a week    Frequency of Social Gatherings with Friends and Family: Once a week    Attends Religious Services: More than 4 times per year    Active Member of [employer] or Organizations: Yes    Attends Banker Meetings: More than 4 times per year    Marital Status: Widowed  Intimate Partner Violence: Not At Risk (02/27/2024)   Epic    Fear of Current or Ex-Partner: No    Emotionally Abused: No    Physically Abused: No    Sexually Abused: No  Depression (PHQ2-9): Low Risk (06/29/2024)   Depression (PHQ2-9)    PHQ-2 Score: 0  Alcohol Screen: Low Risk (06/27/2021)   Alcohol Screen    Last Alcohol Screening Score (AUDIT): 0  Housing: Low Risk (02/27/2024)   Epic    Unable to Pay for Housing in the Last Year: No    Number of Times Moved in the Last Year: 0    Homeless in the Last Year: No  Utilities: Not At Risk (02/27/2024)   Epic    Threatened with loss of utilities: No  Health Literacy: Not on file    Outpatient Medications Prior to Visit  Medication Sig Dispense  Refill   albuterol  (VENTOLIN  HFA) 108 (90 Base) MCG/ACT inhaler Inhale 2 puffs into the lungs every 6 (six) hours as needed for wheezing or shortness of breath. 8 g 2   bisacodyl  (DULCOLAX) 5 MG EC tablet Take 2 tablets (10 mg total) by mouth every other day.     cetirizine  (ZYRTEC  ALLERGY) 10 MG tablet Take 1 tablet (10 mg total) by mouth daily. 30 tablet 1   fluticasone  (FLONASE ) 50 MCG/ACT nasal spray Place 2 sprays into both nostrils daily. 16 g 6   gabapentin  (NEURONTIN ) 100 MG capsule Take 1 capsule (100 mg total) by mouth at bedtime. 30 capsule 5   Glucose Blood (BLOOD GLUCOSE TEST STRIPS) STRP 1 each by In Vitro route in the morning, at noon, and at bedtime. May substitute to any manufacturer covered by patient's insurance. 100 strip 5   guaiFENesin  (ROBITUSSIN) 100 MG/5ML liquid Take 5 mLs by mouth every 4 (four) hours as needed for cough or to loosen phlegm. 120 mL 0   insulin  glargine (LANTUS ) 100 UNIT/ML injection Inject 10 Units into the skin at bedtime.     lidocaine -prilocaine  (EMLA ) cream Apply 1 Application topically as needed (port).     losartan  (COZAAR ) 100 MG tablet Take 1 tablet (100 mg total) by mouth daily. 90 tablet 3   NIFEdipine  (ADALAT  CC) 60 MG 24 hr tablet Take 1 tablet (60 mg total) by mouth daily. 90 tablet 3   ondansetron  (ZOFRAN ) 4 MG tablet Take 1 tablet (4 mg total) by mouth every 8 (eight) hours as needed for nausea or vomiting. 15 tablet 0   polyethylene glycol powder (GLYCOLAX /MIRALAX ) 17 GM/SCOOP powder Take 17 g by mouth daily. Dissolve 1 capful (17g) in 4-8 ounces of liquid and take by mouth daily.     predniSONE  (DELTASONE ) 10 MG tablet  Prednisone  40 mg p.o. once daily for 3 days, then 30 mg p.o. once daily for 3 days, then 20 mg p.o. once daily for 3 days, then 10 mg p.o. once daily for 3 days and stop. 30 tablet 0   RELION PEN NEEDLES 31G X 6 MM MISC USE THREE TIMES DAILY 100 each 0   senna-docusate (SENOKOT-S) 8.6-50 MG tablet Take 1 tablet by mouth at  bedtime as needed for mild constipation. 30 tablet 0   thyroid  (ARMOUR) 90 MG tablet Take 1 tablet by mouth once daily 30 tablet 0   Fluticasone -Umeclidin-Vilant (TRELEGY ELLIPTA ) 100-62.5-25 MCG/ACT AEPB Inhale 1 Inhalation into the lungs daily in the afternoon. 1 each 1   furosemide  (LASIX ) 20 MG tablet Take 1 tablet (20 mg total) by mouth daily. 30 tablet 2   No facility-administered medications prior to visit.    Allergies  Allergen Reactions   Lisinopril  Swelling    Angioedema   Oxaliplatin  Shortness Of Breath and Swelling   Statins     Myalgias   Atorvastatin  Other (See Comments)    Muscle aches     ROS Review of Systems  Constitutional:  Negative for chills and fever.  HENT:  Positive for congestion and tinnitus. Negative for sore throat.   Eyes:  Positive for visual disturbance. Negative for pain and discharge.  Respiratory:  Positive for cough. Negative for shortness of breath.   Cardiovascular:  Negative for chest pain and palpitations.  Gastrointestinal:  Negative for abdominal pain, diarrhea, nausea and vomiting.  Endocrine: Negative for polydipsia and polyuria.  Genitourinary:  Negative for dysuria and hematuria.  Musculoskeletal:  Positive for arthralgias. Negative for neck pain and neck stiffness.  Skin:  Negative for rash.  Neurological:  Negative for dizziness and weakness.  Psychiatric/Behavioral:  Negative for agitation and behavioral problems.       Objective:    Physical Exam Vitals reviewed.  Constitutional:      General: She is not in acute distress.    Appearance: She is obese. She is not diaphoretic.  HENT:     Head: Normocephalic and atraumatic.     Nose: Congestion present.     Mouth/Throat:     Mouth: Mucous membranes are moist.     Pharynx: No posterior oropharyngeal erythema.  Eyes:     General: No scleral icterus.    Extraocular Movements: Extraocular movements intact.  Cardiovascular:     Rate and Rhythm: Normal rate and regular  rhythm.     Heart sounds: Normal heart sounds. No murmur heard. Pulmonary:     Breath sounds: Normal breath sounds. No wheezing or rales.  Musculoskeletal:     Cervical back: Neck supple. No tenderness.     Right lower leg: No edema.     Left lower leg: No edema.  Skin:    General: Skin is warm.  Neurological:     General: No focal deficit present.     Mental Status: She is alert and oriented to person, place, and time.     Sensory: No sensory deficit.     Motor: No weakness.  Psychiatric:        Mood and Affect: Mood normal.        Behavior: Behavior normal.     BP (!) 144/80 (BP Location: Left Arm)   Pulse 60   Wt 142 lb 9.6 oz (64.7 kg)   SpO2 96%   BMI 25.26 kg/m  Wt Readings from Last 3 Encounters:  06/29/24 142 lb 9.6  oz (64.7 kg)  05/28/24 146 lb (66.2 kg)  05/25/24 147 lb 12.8 oz (67 kg)    Lab Results  Component Value Date   TSH 9.380 (H) 02/24/2024   Lab Results  Component Value Date   WBC 8.3 05/10/2024   HGB 12.1 05/10/2024   HCT 37.4 05/10/2024   MCV 95.7 05/10/2024   PLT 243 05/10/2024   Lab Results  Component Value Date   NA 139 05/10/2024   K 3.8 05/10/2024   CO2 23 05/10/2024   GLUCOSE 170 (H) 05/10/2024   BUN 19 05/10/2024   CREATININE 0.80 05/10/2024   BILITOT 0.4 05/10/2024   ALKPHOS 123 05/10/2024   AST 63 (H) 05/10/2024   ALT 29 05/10/2024   PROT 8.9 (H) 05/10/2024   ALBUMIN 4.5 05/10/2024   CALCIUM  10.3 05/10/2024   ANIONGAP 14 05/10/2024   EGFR 42 (L) 06/24/2023   GFR 74.79 07/15/2013   Lab Results  Component Value Date   CHOL 219 (H) 10/10/2022   Lab Results  Component Value Date   HDL 53 10/10/2022   Lab Results  Component Value Date   LDLCALC 145 (H) 10/10/2022   Lab Results  Component Value Date   TRIG 118 10/10/2022   Lab Results  Component Value Date   CHOLHDL 4.1 10/10/2022   Lab Results  Component Value Date   HGBA1C 6.1 (H) 02/24/2024      Assessment & Plan:   Problem List Items Addressed This  Visit       Cardiovascular and Mediastinum   Essential hypertension - Primary   BP Readings from Last 1 Encounters:  06/29/24 (!) 144/80   Elevated today, but has not taken her medicine today Usually well-controlled with Losartan  100 mg QD and nifedipine  60 mg QD now Counseled for compliance with the medications Advised DASH diet and moderate exercise/walking as tolerated      Relevant Orders   CMP14+EGFR   Nonischemic cardiomyopathy (HCC)   On Losartan  Had to stop Coreg  due to bradycardia Appears euvolemic currently, discontinue Lasix  as she has not been taking it anyways Followed by Cardiology        Respiratory   Pulmonary emphysema (HCC)   Noted on CT chest Currently asymptomatic Albuterol  as needed for dyspnea or wheezing Was given Trelegy from hospitalization, but did not start it      Acute non-recurrent sinusitis   Considering improvement in symptoms since yesterday, would avoid antibiotic Advised to use Flonase  as needed for nasal congestion/allergies Tessalon  as needed for cough        Digestive   Malignant neoplasm of head of pancreas (HCC)   Followed by GI and oncology - s/p chemotherapy Undergoing radiation treatment        Endocrine   Hypothyroidism   Lab Results  Component Value Date   TSH 9.380 (H) 02/24/2024   On NP thyroid  90 mg QD, last TSH check was checked during acute infection Recheck TSH and free T4 Had discussed about switching to Levothyroxine , she would prefer to stay on NP thyroid  for now      Relevant Orders   TSH + free T4   Type 2 diabetes mellitus with other specified complication (HCC)   Lab Results  Component Value Date   HGBA1C 6.1 (H) 02/24/2024   Improved compared to prior Well-controlled for her age, check HbA1c On Basaglar  10 units nightly, has not needed ISS recently Associated with HTN and HLD Metformin  discontinued from recent hospitalization Avoid sulfonylureas or incretin analogs  due to pancreatic  mass Advised to follow diabetic diet On ARB F/u CMP and lipid panel Diabetic eye exam: Advised to follow up with Ophthalmology for diabetic eye exam      Relevant Orders   CMP14+EGFR   Hemoglobin A1c     Other   HLD (hyperlipidemia)   Has uncontrolled HLD Did not tolerate statins Had Pharm.D. consultation for PCSK9 inhibitor therapy through cardiology, was approved for Leqvio , but could not start due to chemotherapy      Relevant Orders   Lipid Profile        No orders of the defined types were placed in this encounter.   Follow-up: Return in about 4 months (around 10/27/2024) for HTN and DM.    Suzzane MARLA Blanch, MD "

## 2024-06-29 NOTE — Assessment & Plan Note (Addendum)
 Noted on CT chest Currently asymptomatic Albuterol  as needed for dyspnea or wheezing Was given Trelegy from hospitalization, but did not start it

## 2024-06-29 NOTE — Assessment & Plan Note (Signed)
 Considering improvement in symptoms since yesterday, would avoid antibiotic Advised to use Flonase  as needed for nasal congestion/allergies Tessalon  as needed for cough

## 2024-06-29 NOTE — Assessment & Plan Note (Signed)
 Has uncontrolled HLD Did not tolerate statins Had Pharm.D. consultation for PCSK9 inhibitor therapy through cardiology, was approved for Leqvio , but could not start due to chemotherapy

## 2024-06-29 NOTE — Assessment & Plan Note (Addendum)
 Followed by GI and oncology - s/p chemotherapy Undergoing radiation treatment

## 2024-06-29 NOTE — Patient Instructions (Signed)
 Please continue to take medications as prescribed.  Please continue to follow low carb diet and perform moderate exercise/walking as tolerated.

## 2024-06-29 NOTE — Assessment & Plan Note (Addendum)
 Lab Results  Component Value Date   TSH 9.380 (H) 02/24/2024   On NP thyroid  90 mg QD, last TSH check was checked during acute infection Recheck TSH and free T4 Had discussed about switching to Levothyroxine , she would prefer to stay on NP thyroid  for now

## 2024-06-30 ENCOUNTER — Ambulatory Visit: Payer: Self-pay | Admitting: Internal Medicine

## 2024-06-30 LAB — CMP14+EGFR
ALT: 14 IU/L (ref 0–32)
AST: 21 IU/L (ref 0–40)
Albumin: 3.7 g/dL — ABNORMAL LOW (ref 3.8–4.8)
Alkaline Phosphatase: 110 IU/L (ref 49–135)
BUN/Creatinine Ratio: 17 (ref 12–28)
BUN: 17 mg/dL (ref 8–27)
Bilirubin Total: 0.3 mg/dL (ref 0.0–1.2)
CO2: 23 mmol/L (ref 20–29)
Calcium: 9.1 mg/dL (ref 8.7–10.3)
Chloride: 104 mmol/L (ref 96–106)
Creatinine, Ser: 1.03 mg/dL — ABNORMAL HIGH (ref 0.57–1.00)
Globulin, Total: 3.3 g/dL (ref 1.5–4.5)
Glucose: 98 mg/dL (ref 70–99)
Potassium: 3.8 mmol/L (ref 3.5–5.2)
Sodium: 143 mmol/L (ref 134–144)
Total Protein: 7 g/dL (ref 6.0–8.5)
eGFR: 56 mL/min/1.73 — ABNORMAL LOW

## 2024-06-30 LAB — LIPID PANEL
Chol/HDL Ratio: 3.1 ratio (ref 0.0–4.4)
Cholesterol, Total: 192 mg/dL (ref 100–199)
HDL: 62 mg/dL
LDL Chol Calc (NIH): 112 mg/dL — ABNORMAL HIGH (ref 0–99)
Triglycerides: 98 mg/dL (ref 0–149)
VLDL Cholesterol Cal: 18 mg/dL (ref 5–40)

## 2024-06-30 LAB — HEMOGLOBIN A1C
Est. average glucose Bld gHb Est-mCnc: 143 mg/dL
Hgb A1c MFr Bld: 6.6 % — ABNORMAL HIGH (ref 4.8–5.6)

## 2024-06-30 LAB — TSH+FREE T4
Free T4: 0.45 ng/dL — ABNORMAL LOW (ref 0.82–1.77)
TSH: 16.8 u[IU]/mL — ABNORMAL HIGH (ref 0.450–4.500)

## 2024-06-30 MED ORDER — THYROID 120 MG PO TABS: 120.0000 mg | ORAL_TABLET | Freq: Every day | ORAL | 1 refills | Status: DC

## 2024-06-30 NOTE — Addendum Note (Signed)
 Addended byBETHA TOBIE DOWNS on: 06/30/2024 08:00 AM   Modules accepted: Orders

## 2024-07-01 ENCOUNTER — Telehealth: Payer: Self-pay

## 2024-07-01 MED ORDER — AMOXICILLIN-POT CLAVULANATE 875-125 MG PO TABS: 1.0000 | ORAL_TABLET | Freq: Two times a day (BID) | ORAL | 0 refills | Status: DC

## 2024-07-01 NOTE — Telephone Encounter (Signed)
 Copied from CRM [id number]. Topic: Clinical - Medication Question >> Jul 01, 2024  9:56 AM Selinda RAMAN wrote: Reason for CRM: The patient called in stating she had an appointment with her provider recently and he told her to call back if symptoms persist or if she has colored mucous. She said although she is some better when she coughs she spits up green mucous. She is calling in to inform her provider so he can call in an antibiotic due to the green mucous. She uses  [facility] [address], [place] - [address]  Phone: [phone] Fax: [phone]   Please assist patient further

## 2024-07-01 NOTE — Addendum Note (Signed)
 Addended byBETHA TOBIE DOWNS on: 07/01/2024 11:00 AM   Modules accepted: Orders

## 2024-07-01 NOTE — Telephone Encounter (Signed)
 Pt informed

## 2024-07-06 ENCOUNTER — Other Ambulatory Visit: Payer: Self-pay | Admitting: Internal Medicine

## 2024-07-06 ENCOUNTER — Telehealth: Payer: Self-pay

## 2024-07-06 DIAGNOSIS — E039 Hypothyroidism, unspecified: Secondary | ICD-10-CM

## 2024-07-06 MED ORDER — LEVOTHYROXINE SODIUM 200 MCG PO TABS: 200.0000 ug | ORAL_TABLET | Freq: Every day | ORAL | 1 refills | Status: AC

## 2024-07-06 NOTE — Telephone Encounter (Signed)
 Copied from CRM [id number]. Topic: Clinical - Prescription Issue >> Jul 06, 2024  3:23 PM Mercedes MATSU wrote: Reason for CRM: Patient called in saying that her thyroid  (ARMOUR) 120 MG tablet has gone up and is too expensive. She wants to know if a cheaper alternative can be sent to her pharmacy on file. Patient is also requesting a call back and can be reached at [phone].

## 2024-07-07 NOTE — Telephone Encounter (Signed)
 Pt informed

## 2024-07-12 ENCOUNTER — Encounter: Payer: Self-pay | Admitting: Hematology

## 2024-07-12 ENCOUNTER — Telehealth: Payer: Self-pay | Admitting: Pharmacy Technician

## 2024-07-12 ENCOUNTER — Other Ambulatory Visit (HOSPITAL_COMMUNITY): Payer: Self-pay

## 2024-07-12 ENCOUNTER — Encounter: Payer: Self-pay | Admitting: Cardiology

## 2024-07-12 ENCOUNTER — Other Ambulatory Visit: Payer: Self-pay

## 2024-07-12 DIAGNOSIS — E1169 Type 2 diabetes mellitus with other specified complication: Secondary | ICD-10-CM

## 2024-07-12 MED ORDER — LANCETS MISC: 1.0000 | 0 refills | Status: AC

## 2024-07-12 MED ORDER — BLOOD GLUCOSE TEST VI STRP: 1.0000 | ORAL_STRIP | 0 refills | Status: AC

## 2024-07-12 MED ORDER — BLOOD GLUCOSE MONITORING SUPPL DEVI: 1.0000 | 0 refills | Status: AC

## 2024-07-12 MED ORDER — LANCET DEVICE MISC: 1.0000 | 0 refills | Status: AC

## 2024-07-12 NOTE — Telephone Encounter (Signed)
 Pharmacy Patient Advocate Encounter   Received notification from [facility] KEY that prior authorization for One Touch Verio Strips is required/requested.   Insurance verification completed.   The patient is insured through [facility] [employer].   Per test claim:  Accu-check or Contour is preferred by the insurance.  If suggested medication is appropriate, Please send in a new RX and discontinue this one. If not, please advise as to why it's not appropriate so that we may request a Prior Authorization. Please note, some preferred medications may still require a PA.  If the suggested medications have not been trialed and there are no contraindications to their use, the PA will not be submitted, as it will not be approved. Archived Key: [id number]

## 2024-07-12 NOTE — Telephone Encounter (Signed)
 Refill sent

## 2024-07-12 NOTE — Telephone Encounter (Signed)
 Pals send rx for accucheck with twice daily testing supplies and let pt know of the substitution due to ins coverage

## 2024-07-14 ENCOUNTER — Ambulatory Visit: Payer: Self-pay

## 2024-07-14 NOTE — Telephone Encounter (Signed)
 Noted.

## 2024-07-14 NOTE — Telephone Encounter (Signed)
" ° ° ° °  Reason for Triage: patient is calling pt bp has been high for several days bp 170/100 as of today  Reason for Disposition  [1] Systolic BP >= 130 OR Diastolic >= 80 AND [2] taking BP medications  Answer Assessment - Initial Assessment Questions Pt states since starting the new thyroid  medication her Bp has been even higher. She told PAS 170/100. She told nurse it was 286/105- Rn asked pt to recheck that while on the phone. Recheck was 146/84. Pt states she typically takes BP before medications. Rn advised pt to take it after medications so we can see how well medications are working. RN gave instructions on how to take accurate Bp. Pt states understanding.      1. BLOOD PRESSURE: What is your blood pressure? Did you take at least two measurements 5 minutes apart?     She said she thinks 286/105, rechecked while on phone with RN 146/84 2. ONSET: When did you take your blood pressure?     While on phone, first one unknown 3. HOW: How did you take your blood pressure? (e.g., automatic home BP monitor, visiting nurse)     Automatic cuff 4. HISTORY: Do you have a history of high blood pressure?     yes 5. MEDICINES: Are you taking any medicines for blood pressure? Have you missed any doses recently?     Denies missing any medications 6. OTHER SYMPTOMS: Do you have any symptoms? (e.g., blurred vision, chest pain, difficulty breathing, headache, weakness)     Denies all symptoms  Protocols used: Blood Pressure - High-A-AH  "

## 2024-07-14 NOTE — Telephone Encounter (Signed)
 FYI Only or Action Required?: FYI only for provider: appointment scheduled on 1.23.26.  Patient was last seen in primary care on 06/29/2024 by [name], MD.  Called Nurse Triage reporting Hypertension.  Symptoms began several days ago.  Interventions attempted: Nothing.  Symptoms are: unchanged.  Triage Disposition: See PCP Within 2 Weeks  Patient/caregiver understands and will follow disposition?: Yes

## 2024-07-16 ENCOUNTER — Encounter: Payer: Self-pay | Admitting: Family Medicine

## 2024-07-16 ENCOUNTER — Ambulatory Visit: Payer: Self-pay | Admitting: Family Medicine

## 2024-07-16 VITALS — BP 164/90 | HR 60 | Ht 63.0 in | Wt 145.1 lb

## 2024-07-16 DIAGNOSIS — I5022 Chronic systolic (congestive) heart failure: Secondary | ICD-10-CM | POA: Diagnosis not present

## 2024-07-16 DIAGNOSIS — I10 Essential (primary) hypertension: Secondary | ICD-10-CM | POA: Diagnosis not present

## 2024-07-16 MED ORDER — CHLORTHALIDONE 25 MG PO TABS: 25.0000 mg | ORAL_TABLET | Freq: Every day | ORAL | 0 refills | Status: AC

## 2024-07-16 NOTE — Progress Notes (Signed)
" ° °  Adrienne [name]     MRN: [id number]      DOB: 06-09-1946  Chief Complaint  Patient presents with   Hypertension    Elevated bp , been having issues with bp.    HPI Adrienne [name] is here with above concern States BP was doing good up until 04/2024  which record review of her blood pressure reflects to be the case   ROS Denies recent fever or chills. Denies sinus pressure, nasal congestion, ear pain or sore throat. Denies chest congestion, productive cough or wheezing. Denies chest pains, palpitations and leg swelling Poor appetite Anxiety about health but extremely engaged in her care   PE BP (!) 164/90   Pulse 60   Ht 5' 3 (1.6 m)   Wt 145 lb 1.3 oz (65.8 kg)   SpO2 96%   BMI 25.70 kg/m    BP (!) 164/90   Pulse 60   Ht 5' 3 (1.6 m)   Wt 145 lb 1.3 oz (65.8 kg)   SpO2 96%   BMI 25.70 kg/m   Patient alert and oriented and in no cardiopulmonary distress.  HEENT: No facial asymmetry, EOMI,     Neck supple .  Chest: Clear to auscultation bilaterally.  CVS: S1, S2 no murmurs, no S3.Regular rate.  Ext: No edema   Psych: Good eye contact, normal affect. Memory intact not anxious or depressed appearing.  CNS: CN 2-12 intact, power,  normal throughout.no focal deficits noted.   Assessment & Plan  Essential hypertension Uncontrolled Add chlorthalidone  25 mg daily Nurse BP check in 10 days Re evaluation by PCP sooner if still elevated DASH diet and commitment to daily physical activity for a minimum of 30 minutes discussed and encouraged, as a part of hypertension management. The importance of attaining a healthy weight is also discussed.     07/16/2024   11:11 AM 07/16/2024   10:58 AM 07/16/2024   10:38 AM 07/16/2024   10:32 AM 06/29/2024    9:52 AM 06/29/2024    9:45 AM 06/29/2024    9:22 AM  BP/Weight  Systolic BP 164 160 193 197 144 148 160  Diastolic BP 90 92 84 81 80 90 95  Wt. (Lbs)    145.08     BMI    25.7 kg/m2          Chronic systolic  CHF (congestive heart failure) (HCC) No s/s o f decompensation currently, followed by cardiology  "

## 2024-07-16 NOTE — Patient Instructions (Addendum)
 F/U with PCP in 4 to 6 weeks, re evaluate blood pressure ( Dr Tobie)  Nurse visit for re evaluation of Blood pressure Feb 3 and Nurse to report to PCP  New additional  medication for blood pressure is chlorthalidone  25 mg one daily  Please continue losartan  and adalat  once daily as before    Yes you may go ahead and take the gabapentin  prescribed for neuropathic pain  Call your Cardiologist fr appointment , he expects to see you in February based on last note  Thanks for choosing [facility], we consider it a privelige to serve you.

## 2024-07-18 NOTE — Assessment & Plan Note (Signed)
 No s/s o f decompensation currently, followed by cardiology

## 2024-07-18 NOTE — Assessment & Plan Note (Signed)
 Uncontrolled Add chlorthalidone  25 mg daily Nurse BP check in 10 days Re evaluation by PCP sooner if still elevated DASH diet and commitment to daily physical activity for a minimum of 30 minutes discussed and encouraged, as a part of hypertension management. The importance of attaining a healthy weight is also discussed.     07/16/2024   11:11 AM 07/16/2024   10:58 AM 07/16/2024   10:38 AM 07/16/2024   10:32 AM 06/29/2024    9:52 AM 06/29/2024    9:45 AM 06/29/2024    9:22 AM  BP/Weight  Systolic BP 164 160 193 197 144 148 160  Diastolic BP 90 92 84 81 80 90 95  Wt. (Lbs)    145.08     BMI    25.7 kg/m2

## 2024-07-19 ENCOUNTER — Telehealth: Payer: Self-pay | Admitting: Pharmacy Technician

## 2024-07-19 NOTE — Telephone Encounter (Signed)
 Auth Submission: APPROVED PAP FYI UPDATED Site of care: Site of care: [facility] Payer: [employer] Medication & CPT/J Code(s) submitted: Leqvio  (Inclisiran) J1306 Diagnosis Code: E78.5 Route of submission (phone, fax, portal):  Phone # Fax # Auth type: FREE DRUG Units/visits requested: Q6MONTHS X2 Reference number:  Approval from: 07/24/24 to 06/23/25

## 2024-07-27 ENCOUNTER — Ambulatory Visit: Payer: Self-pay

## 2024-07-29 ENCOUNTER — Encounter: Payer: Self-pay | Admitting: Cardiology

## 2024-07-29 ENCOUNTER — Ambulatory Visit

## 2024-07-29 ENCOUNTER — Encounter: Payer: Self-pay | Admitting: Hematology

## 2024-07-29 ENCOUNTER — Ambulatory Visit
Admission: RE | Admit: 2024-07-29 | Discharge: 2024-07-29 | Disposition: A | Source: Ambulatory Visit | Attending: Radiation Oncology

## 2024-07-29 DIAGNOSIS — C250 Malignant neoplasm of head of pancreas: Secondary | ICD-10-CM

## 2024-07-29 NOTE — Progress Notes (Signed)
" °  Radiation Oncology         [phone] ________________________________  Name: Adrienne [name] MRN: [id number]  Date of Service: 07/29/2024  DOB: [birth year]  Post Treatment Telephone Note  Diagnosis:  Malignant neoplasm of head of pancreas     The patient was available for call today.  The patient reported mild fatigue during radiation. The patient did not note nausea during therapy.  Patient states she is doing well since completion of her treatments.  She reports she is unsure of what comes next.  I let her know that I would send a message to Dr. Demetra office, they will contact her for an appointment.  The patient was encouraged to call if she develop concerns or questions regarding radiation.             "

## 2024-08-02 ENCOUNTER — Ambulatory Visit

## 2024-08-03 ENCOUNTER — Ambulatory Visit

## 2024-08-25 ENCOUNTER — Ambulatory Visit: Payer: Self-pay | Admitting: Internal Medicine

## 2024-10-28 ENCOUNTER — Ambulatory Visit: Payer: Self-pay | Admitting: Internal Medicine
